# Patient Record
Sex: Female | Born: 1950
Health system: Southern US, Community
[De-identification: ages and names within clinical notes are randomized; demographics above are authoritative.]

## PROBLEM LIST (undated history)

## (undated) DIAGNOSIS — C20 Malignant neoplasm of rectum: Secondary | ICD-10-CM

## (undated) DIAGNOSIS — M8580 Other specified disorders of bone density and structure, unspecified site: Secondary | ICD-10-CM

## (undated) DIAGNOSIS — E559 Vitamin D deficiency, unspecified: Secondary | ICD-10-CM

## (undated) DIAGNOSIS — I82621 Acute embolism and thrombosis of deep veins of right upper extremity: Secondary | ICD-10-CM

## (undated) DIAGNOSIS — C189 Malignant neoplasm of colon, unspecified: Secondary | ICD-10-CM

## (undated) DIAGNOSIS — J90 Pleural effusion, not elsewhere classified: Secondary | ICD-10-CM

## (undated) DIAGNOSIS — J449 Chronic obstructive pulmonary disease, unspecified: Secondary | ICD-10-CM

## (undated) DIAGNOSIS — J302 Other seasonal allergic rhinitis: Secondary | ICD-10-CM

## (undated) DIAGNOSIS — J45909 Unspecified asthma, uncomplicated: Secondary | ICD-10-CM

## (undated) DIAGNOSIS — H269 Unspecified cataract: Secondary | ICD-10-CM

## (undated) DIAGNOSIS — K631 Perforation of intestine (nontraumatic): Secondary | ICD-10-CM

## (undated) DIAGNOSIS — R579 Shock, unspecified: Secondary | ICD-10-CM

## (undated) DIAGNOSIS — K649 Unspecified hemorrhoids: Secondary | ICD-10-CM

## (undated) DIAGNOSIS — J9621 Acute and chronic respiratory failure with hypoxia: Secondary | ICD-10-CM

## (undated) DIAGNOSIS — Z930 Tracheostomy status: Secondary | ICD-10-CM

## (undated) HISTORY — PX: FOOT SURGERY: SHX648

## (undated) HISTORY — DX: Malignant neoplasm of colon, unspecified: C18.9

## (undated) HISTORY — DX: Malignant neoplasm of rectum: C20

## (undated) HISTORY — DX: Unspecified hemorrhoids: K64.9

## (undated) HISTORY — DX: Pleural effusion, not elsewhere classified: J90

## (undated) HISTORY — DX: Shock, unspecified: R57.9

## (undated) HISTORY — DX: Other specified disorders of bone density and structure, unspecified site: M85.80

## (undated) HISTORY — PX: AUGMENTATION MAMMAPLASTY: SUR837

## (undated) HISTORY — DX: Chronic obstructive pulmonary disease, unspecified: J44.9

## (undated) HISTORY — DX: Unspecified cataract: H26.9

## (undated) HISTORY — PX: BREAST ENHANCEMENT SURGERY: SHX7

## (undated) HISTORY — PX: TONSILLECTOMY: SHX5217

## (undated) MED FILL — Dexamethasone Sodium Phosphate Inj 100 MG/10ML: INTRAMUSCULAR | Qty: 1 | Status: AC

---
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
## 2000-04-26 ENCOUNTER — Encounter: Admission: RE | Admit: 2000-04-26 | Discharge: 2000-04-26 | Payer: Self-pay | Admitting: Obstetrics and Gynecology

## 2000-04-26 ENCOUNTER — Encounter: Payer: Self-pay | Admitting: Obstetrics and Gynecology

## 2005-09-24 ENCOUNTER — Ambulatory Visit: Payer: Self-pay | Admitting: Critical Care Medicine

## 2005-11-03 ENCOUNTER — Ambulatory Visit: Payer: Self-pay | Admitting: Critical Care Medicine

## 2006-07-05 ENCOUNTER — Ambulatory Visit: Payer: Self-pay | Admitting: Critical Care Medicine

## 2007-04-07 ENCOUNTER — Ambulatory Visit: Payer: Self-pay | Admitting: Critical Care Medicine

## 2007-04-08 ENCOUNTER — Other Ambulatory Visit: Admission: RE | Admit: 2007-04-08 | Discharge: 2007-04-08 | Payer: Self-pay | Admitting: Obstetrics & Gynecology

## 2007-05-03 ENCOUNTER — Ambulatory Visit: Payer: Self-pay | Admitting: Critical Care Medicine

## 2007-11-03 DIAGNOSIS — J4489 Other specified chronic obstructive pulmonary disease: Secondary | ICD-10-CM | POA: Insufficient documentation

## 2007-11-03 DIAGNOSIS — J449 Chronic obstructive pulmonary disease, unspecified: Secondary | ICD-10-CM | POA: Insufficient documentation

## 2007-11-03 DIAGNOSIS — J438 Other emphysema: Secondary | ICD-10-CM | POA: Insufficient documentation

## 2008-01-16 ENCOUNTER — Telehealth (INDEPENDENT_AMBULATORY_CARE_PROVIDER_SITE_OTHER): Payer: Self-pay | Admitting: *Deleted

## 2008-02-29 ENCOUNTER — Ambulatory Visit: Payer: Self-pay | Admitting: Critical Care Medicine

## 2008-11-21 ENCOUNTER — Other Ambulatory Visit: Admission: RE | Admit: 2008-11-21 | Discharge: 2008-11-21 | Payer: Self-pay | Admitting: Obstetrics & Gynecology

## 2009-01-21 ENCOUNTER — Ambulatory Visit: Payer: Self-pay | Admitting: Critical Care Medicine

## 2009-05-30 ENCOUNTER — Telehealth (INDEPENDENT_AMBULATORY_CARE_PROVIDER_SITE_OTHER): Payer: Self-pay | Admitting: *Deleted

## 2009-10-11 ENCOUNTER — Ambulatory Visit: Payer: Self-pay | Admitting: Critical Care Medicine

## 2009-12-14 DIAGNOSIS — J449 Chronic obstructive pulmonary disease, unspecified: Secondary | ICD-10-CM

## 2009-12-14 HISTORY — DX: Chronic obstructive pulmonary disease, unspecified: J44.9

## 2010-04-01 ENCOUNTER — Telehealth: Payer: Self-pay | Admitting: Internal Medicine

## 2010-09-08 ENCOUNTER — Encounter: Payer: Self-pay | Admitting: Critical Care Medicine

## 2010-09-08 ENCOUNTER — Ambulatory Visit: Payer: Self-pay | Admitting: Critical Care Medicine

## 2011-01-06 ENCOUNTER — Ambulatory Visit
Admission: RE | Admit: 2011-01-06 | Discharge: 2011-01-06 | Payer: Self-pay | Source: Home / Self Care | Attending: Family Medicine | Admitting: Family Medicine

## 2011-01-06 DIAGNOSIS — Z9189 Other specified personal risk factors, not elsewhere classified: Secondary | ICD-10-CM

## 2011-01-06 DIAGNOSIS — M25559 Pain in unspecified hip: Secondary | ICD-10-CM

## 2011-01-06 DIAGNOSIS — J301 Allergic rhinitis due to pollen: Secondary | ICD-10-CM | POA: Insufficient documentation

## 2011-01-06 DIAGNOSIS — R030 Elevated blood-pressure reading, without diagnosis of hypertension: Secondary | ICD-10-CM | POA: Insufficient documentation

## 2011-01-06 HISTORY — DX: Other specified personal risk factors, not elsewhere classified: Z91.89

## 2011-01-06 HISTORY — DX: Pain in unspecified hip: M25.559

## 2011-01-15 NOTE — Progress Notes (Signed)
Summary: cough> needs ov if wants tussionex or any narcotic-LMTCB x 1   Phone Note Call from Patient   Caller: Patient Call For: wright Summary of Call: need refill on t[facility] Initial call taken by: Rickard Patience,  April 01, 2010 10:12 AM  Follow-up for Phone Call        called and spoke with pt.  pt last saw PW 10/11/2009 and was told to f/u in 6 months.  Informed pt she was due for f/u appt.  Pt states she will have to call back to schedule appt because she was at home and didn't have her work schedule with her.  However, pt calls today c/o non-productive cough that started over the weekend.  Pt believes this is d/t allergies.  Pt states she is taking Claritin D and an OTC cough syrup with no relief of symptoms.  Pt states only an occ sore throat but believes this is d/t the increased coughing esp at night.  Pt denied any increased sob. Pt requests rx for Tussionex.  Please advise.  Thank you.  Aundra Millet Reynolds LPN  April 01, 2010 10:31 AM  will need ov with Tammy for tussionex rx Follow-up by: Nyoka Cowden MD,  April 01, 2010 1:22 PM  Additional Follow-up for Phone Call Additional follow up Details #1::        LMOMTCB Vernie Murders  April 01, 2010 2:04 PM  pt advised and schedueld to see TP tomorrow. Carron Curie CMA  April 01, 2010 2:22 PM

## 2011-01-15 NOTE — Assessment & Plan Note (Signed)
Summary: Pulmonary OV   CC:  Follow up for refills.  Breathing unchaged from last OV.  Denies wheezing, chest tightness, and cough.  .  History of Present Illness: This is a 60 year old, white female, COPD with primary emphysematous component   September 08, 2010 2:39 PM f/u copd No real changes The pt notes dyspnea is stable and no new issues.   Pt denies any significant sore throat, nasal congestion or excess secretions, fever, chills, sweats, unintended weight loss, pleurtic or exertional chest pain, orthopnea PND, or leg swelling Pt denies any increase in rescue therapy over baseline, denies waking up needing it or having any early am or nocturnal exacerbations of coughing/wheezing/or dyspnea.   Preventive Screening-Counseling & Management  Alcohol-Tobacco     Smoking Status: quit > 6 months     Year Quit: 1996     Pack years: 5  Current Medications (verified): 1)  Spiriva Handihaler 18 Mcg Caps (Tiotropium Bromide Monohydrate) .... One Capsule in Handihaler Once Daily Needs Appt With Dr. Delford Field! 2)  Albuterol Sulfate (2.5 Mg/62ml) 0.083%  Nebu (Albuterol Sulfate) .... One in Neb Four Times Daily As Needed 3)  Claritin-[name] 24 Hour 10-240 Mg  Tb24 (Loratadine-Pseudoephedrine) .... As Needed 4)  Advair Diskus 250-50 Mcg/dose Aepb (Fluticasone-Salmeterol) .... Inhale 1 Puff Two Times A Day  Needs Appt With Dr Daphene Calamity!  Allergies (verified): 1)  ! Codeine  Past History:  Past medical, surgical, family and social histories (including risk factors) reviewed, and no changes noted (except as noted below).  Past Medical History: Tracy [name]   2011:  FeV1 31% predicted  FeV1/FVC 47%  Emphysema  Family History: Reviewed history from 02/29/2008 and no changes required. non contrib  Social History: Reviewed history from 02/29/2008 and no changes required. Patient states former smoker.  manager of real estate company  Review of Systems       The patient complains of shortness of  breath with activity.  The patient denies shortness of breath at rest, productive cough, non-productive cough, coughing up blood, chest pain, irregular heartbeats, acid heartburn, indigestion, loss of appetite, weight change, abdominal pain, difficulty swallowing, sore throat, tooth/dental problems, headaches, nasal congestion/difficulty breathing through nose, sneezing, itching, ear ache, anxiety, depression, hand/feet swelling, joint stiffness or pain, rash, change in color of mucus, and fever.    Vital Signs:  Patient profile:   60 year old female Height:      62 inches Weight:      124.13 pounds BMI:     22.79 O2 Sat:      96 % on Room air Temp:     98.5 degrees F oral Pulse rate:   92 / minute BP sitting:   108 / 84  (right arm) Cuff size:   regular  Vitals Entered By: Gweneth Dimitri RN (September 08, 2010 2:27 PM)  O2 Flow:  Room air   Contraindications/Deferment of Procedures/Staging:    Treatment: Flu Shot    Contraindication: adverse rxn/side effects     Treatment: Pneumovax    Contraindication: adverse rxn/side effects   Physical Exam  Additional Exam:  Gen: WD WN    WF      in NAD    NCAT Heent:  no jvd, no TMG, no cervical LNademopathy, orophyx clear,  nares with clear watery drainage. Cor: RRR nl s1/s2  no s3/s4  no m r h g Abd: soft NT BSA   no masses  No HSM  no rebound or guarding Ext perfused with  no c v e v.[name] Neuro: intact, moves all 4s, CN II-XII intact, DTRs intact Chest: distant BS  no wheezes, rales, rhonchi   no egophony  no consolidative breath sounds, mild hyperresonance to percussion Skin: clear  Genital/Rectal :deferred    Impression & Recommendations:  Problem # 1:  C O P [name] (ICD-496) Assessment Unchanged  Primary emphysema, stable at this time FeV1 improved from  27 to 31% predicted.  plan: cont advair/spiriva daily  Medications Added to Medication List This Visit: 1)  Advair Diskus 250-50 Mcg/dose Aepb (Fluticasone-salmeterol) ....  Inhale 1 puff two times a day  needs appt with dr Daphene Calamity!  Complete Medication List: 1)  Spiriva Handihaler 18 Mcg Caps (Tiotropium bromide monohydrate) .... One capsule in handihaler once daily needs appt with dr. Delford Field! 2)  Albuterol Sulfate (2.5 Mg/63ml) 0.083% Nebu (Albuterol sulfate) .... One in neb four times daily as needed 3)  Claritin-[name] 24 Hour 10-240 Mg Tb24 (Loratadine-pseudoephedrine) .... As needed 4)  Advair Diskus 250-50 Mcg/dose Aepb (Fluticasone-salmeterol) .... Inhale 1 puff two times a day  needs appt with dr Daphene Calamity!  Other Orders: Est. Patient Level III (16109) Spirometry w/Graph (94010)  Patient Instructions: 1)  No change in medications 2)  Return in     12     months 3)  You declined to receive flu and pneumovax Prescriptions: ALBUTEROL SULFATE (2.5 MG/3ML) 0.083%  NEBU (ALBUTEROL SULFATE) one in neb four times daily as needed  #150 x 12   Entered and Authorized by:   Storm Frisk MD   Signed by:   Storm Frisk MD on 09/08/2010   Method used:   [facility]ron[facility] to        [phone][facility] ([facility])       [address] [place], [place]  [address]       Ph: [phone] or [phone]       Fax: [phone]   RxID:   [id number] ADVAIR DISKUS 250-50 MCG/DOSE AEPB (FLUTICASONE-SALMETEROL) inhale 1 puff two times a day  NEEDS APPT WITH DR [facility]  #1 x 12   Entered and Authorized by:   Storm Frisk MD   Signed by:   Storm Frisk MD on 09/08/2010   Method used:   [facility]        [facility] [phone]* (retail)       [address] [place], [place]  [address]       Ph: [phone] or [phone]       Fax: [phone]   RxID:   [id number] SPIRIVA HANDIHALER 18 MCG CAPS (TIOTROPIUM BROMIDE MONOHYDRATE) one capsule in handihaler once daily NEEDS APPT WITH DR. Delford Field!  #30 x 12   Entered and Authorized by:   Storm Frisk MD   Signed by:   Storm Frisk MD on 09/08/2010   Method used:    [facility]        [facility]  [address] [phone]* (retail)       [address]       [place], [place]  [address]       Ph: [phone] or [phone]       Fax: [phone]   RxID:   [id number]

## 2011-01-15 NOTE — Assessment & Plan Note (Signed)
Summary: New pt pulled back muscle/dt [facility]   Vital Signs:  Patient profile:   60 year old female Height:      62 inches (157.48 cm) Weight:      118.25 pounds (53.75 kg) O2 Sat:      94 % on Room air Temp:     98.0 degrees F (36.67 degrees C) oral Pulse rate:   84 / minute BP sitting:   163 / 76  (right arm) Cuff size:   regular  Vitals Entered By: Josph Macho RMA (01-23-2011 3:35 PM)  O2 Flow:  Room air CC: Establish new patient/ pulled muscle in back lower left side X3 days/ CF Is Patient Diabetic? No   History of Present Illness: is a 60 year old Caucasian female who is in to establish care on an urgent basis due to some back pain. She reports roughly 3 days ago she had a sudden spasm in her back on the left side above her head. She was reaching with her right arm up into her closet when the muscles suddenly tightened. She has been taking some Advil roughly 2 tablets at a time gets minimal relief from this. She can find a relatively comfortable position and her pain is improved but with even a slight turn a sharp pain occurs in the catching in the hip makes it difficult for her to move. She denies any pain along the spine she denies any radiculopathy down either leg, she denies any urinary or bowel incontinence. otherwise she reports she is in generally good health she does follow regularly with gynecologist and she reports that they do her blood work for run annually basis she believes including FLP. She also follows annually with pulmonology for a long history of what was likely childhood asthma and has developed emphysema in her adult years. She reports very good control and cannot recall the last time she reached for her albuterol rescue inhaler. She denies any shortness of breath, chest pain, palpitations, fevers, chills, congestion, GI or GU complaints at this time. She uses Claritin-D intermittently for some seasonal allergies but is not having any difficulties at this  time  Preventive Screening-Counseling & Management  Alcohol-Tobacco     Smoking Status: quit  Caffeine-Diet-Exercise     Does Patient Exercise: yes      Drug Use:  no.    Current Problems (verified): 1)  Hip Pain, Left  (ICD-719.45) 2)  Allergic Rhinitis, Seasonal  (ICD-477.0) 3)  Chickenpox, Hx of  (ICD-V15.9) 4)  Emphysema  (ICD-492.8) 5)  C O P D  (ICD-496)  Current Medications (verified): 1)  Spiriva Handihaler 18 Mcg Caps (Tiotropium Bromide Monohydrate) .... One Capsule in Handihaler Once Daily Needs Appt With Dr. Delford Field! 2)  Albuterol Sulfate (2.5 Mg/68ml) 0.083%  Nebu (Albuterol Sulfate) .... One in Neb Four Times Daily As Needed 3)  Claritin-D 24 Hour 10-240 Mg  Tb24 (Loratadine-Pseudoephedrine) .... As Needed 4)  Advair Diskus 250-50 Mcg/dose Aepb (Fluticasone-Salmeterol) .... Inhale 1 Puff Two Times A Day  Needs Appt With Dr Daphene Calamity!  Allergies (verified): 1)  ! Codeine  Past History:  Past Medical History: Last updated: 09/08/2010 C O P D   2011:  FeV1 31% predicted  FeV1/FVC 47%  Emphysema  Family History: Last updated: Jan 23, 2011 Father: deceased@59 , fell in snow, broke ankle, blood clot to lungs Mother: 66, [employer] Siblings:  Brother: 13s, Parkinson's Disease, h/o being hit by lightening Sister:71,  heart murmur Brother: 62s, [employer] Sistrer: 61, [employer], CML MGM: deceased  older, late 24s MGF: deceased late 33s PGM: deceased younger from burn injury PGF: deceased in 82s, old age Children: Son: 38, asthma, allergies, OM Son: 47, asthma, allergies  Social History: Last updated: 01/06/2011 Patient states former smoker Production designer, 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, Programmer, 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, second marriage Former Smoker, always less than 1/2 ppd and quit > 24yrs ago No dietary restrictions Wears seat belt Drug use-no Regular exercise-yes Alcohol use-yes, occasional   Risk Factors: Exercise: yes (01/06/2011)  Risk  Factors: Smoking Status: quit (01/06/2011)  Past Surgical History: Tonsillectomy for recurrent otitis media at age 39yr  Family History: Father: deceased@59 , fell in snow, broke ankle, blood clot to lungs Mother: 44, [employer] Siblings:  Brother: 88s, Parkinson's Disease, h/o being hit by lightening Sister:71,  heart murmur Brother: 47s, [employer] Sistrer: 61, [employer], CML MGM: deceased older, late 61s MGF: deceased late 23s PGM: deceased younger from burn injury PGF: deceased in 82s, old age Children: Son: 36, asthma, allergies, OM Son: 44, asthma, allergies  Social History: Patient states former smoker Production designer, 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, Programmer, 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, second marriage Former Smoker, always less than 1/2 ppd and quit > 80yrs ago No dietary restrictions Wears seat belt Drug use-no Regular exercise-yes Alcohol use-yes, occasional  Occupation:  employed Smoking Status:  quit Drug Use:  no Does Patient Exercise:  yes  Review of Systems  The patient denies anorexia, fever, weight loss, weight gain, vision loss, decreased hearing, hoarseness, chest pain, syncope, dyspnea on exertion, peripheral edema, prolonged cough, headaches, hemoptysis, abdominal pain, melena, hematochezia, severe indigestion/heartburn, hematuria, incontinence, muscle weakness, suspicious skin lesions, transient blindness, difficulty walking, depression, unusual weight change, and enlarged lymph nodes.    Physical Exam  General:  Well-developed,well-nourished,in no acute distress; alert,appropriate and cooperative throughout examination Head:  Normocephalic and atraumatic without obvious abnormalities. No apparent alopecia or balding. Eyes:  No corneal or conjunctival inflammation noted. EOMI. Perrla.  Ears:  External ear exam shows no significant lesions or deformities.  Otoscopic examination reveals clear canals, tympanic membranes are intact bilaterally without bulging,  retraction, inflammation or discharge. Hearing is grossly normal bilaterally. Nose:  External nasal examination shows no deformity or inflammation. Nasal mucosa are pink and moist without lesions or exudates. Mouth:  Oral mucosa and oropharynx without lesions or exudates.  Teeth in good repair. Neck:  No deformities, masses, or tenderness noted. Lungs:  Normal respiratory effort, chest expands symmetrically. Lungs are clear to auscultation, no crackles or wheezes. Heart:  Normal rate and regular rhythm. S1 and S2 normal without gallop, murmur, click, rub or other extra sounds. Abdomen:  Bowel sounds positive,abdomen soft and non-tender without masses, organomegaly or hernias noted. Msk:  No deformity or scoliosis noted of thoracic or lumbar spine.  Mild pain with palp and extension over left sacroiliac joint Pulses:  R and L carotid,radial,femoral,dorsalis pedis and posterior tibial pulses are full and equal bilaterally Extremities:  No clubbing, cyanosis, edema, or deformity noted with normal full range of motion of all joints.   Neurologic:  No cranial nerve deficits noted. Station and gait are normal. Plantar reflexes are down-going bilaterally. DTRs are symmetrical throughout. Sensory, motor and coordinative functions appear intact. Cervical Nodes:  No lymphadenopathy noted Psych:  Cognition and judgment appear intact. Alert and cooperative with normal attention span and concentration. No apparent delusions, illusions, hallucinations   Impression & Recommendations:  Problem # 1:  HIP PAIN, LEFT (ICD-719.45)  Her updated medication list  for this problem includes:    Cyclobenzaprine Hcl 10 Mg Tabs (Cyclobenzaprine hcl) .Marland Kitchen... 1 tab by mouth two times a day as needed pain, will cause sedation    Naproxen 500 Mg Tabs (Naproxen) .Marland Kitchen... 1 tab by mouth two times a day as needed pain with food Sacroilietis, recommend moist heat and gentle stretching. Report if symptoms worsen  Problem # 2:   EMPHYSEMA (ICD-492.8) Follow annually with pulmonology and maintains very good control of her asthma, needs her Albuterol very infrequently.  Problem # 3:  Preventive Health Care (ICD-V70.0) Will continue receive her GYN and MGMs with her gynecologist, she reports they order her annual labs but she agrees to supply [name] with a copy for review and for future reference so we will know her baseline in the event we need to treat an acute illness  Problem # 4:  ELEVATED BP READING WITHOUT DX HYPERTENSION (ICD-796.2) Mild elevation with acute pain and new visit. Patient denies any history of HTN so will evaluate anew at next visit.  Complete Medication List: 1)  Spiriva Handihaler 18 Mcg Caps (Tiotropium bromide monohydrate) .... One capsule in handihaler once daily needs appt with dr. Delford Field! 2)  Albuterol Sulfate (2.5 Mg/25ml) 0.083% Nebu (Albuterol sulfate) .... One in neb four times daily as needed 3)  Claritin-d 24 Hour 10-240 Mg Tb24 (Loratadine-pseudoephedrine) .... As needed 4)  Advair Diskus 250-50 Mcg/dose Aepb (Fluticasone-salmeterol) .... Inhale 1 puff two times a day  needs appt with dr Daphene Calamity! 5)  Cyclobenzaprine Hcl 10 Mg Tabs (Cyclobenzaprine hcl) .Marland Kitchen.. 1 tab by mouth two times a day as needed pain, will cause sedation 6)  Naproxen 500 Mg Tabs (Naproxen) .Marland Kitchen.. 1 tab by mouth two times a day as needed pain with food  Patient Instructions: 1)  Please schedule a follow-up appointment in 1 year and forward [name] a copy of your labs from your G 2)  YN 3)  Moist heat and gentle stretching daily 4)  Most patients (90%) with low back pain will improve with time ( 2-6 weeks). Keep active but avoid activities that are painful. Apply moist heat and/or ice to lower back several times a day.  Prescriptions: NAPROXEN 500 MG TABS (NAPROXEN) 1 tab by mouth two times a day as needed pain with food  #60 x 1   Entered and Authorized by:   Danise Edge MD   Signed by:   Danise Edge MD on 01/06/2011   Method  used:   Print then Give to Patient   RxID:   [id number] CYCLOBENZAPRINE HCL 10 MG TABS (CYCLOBENZAPRINE HCL) 1 tab by mouth two times a day as needed pain, will cause sedation  #40 x 1   Entered and Authorized by:   Danise Edge MD   Signed by:   Danise Edge MD on 01/06/2011   Method used:   Print then Give to Patient   RxID:   [id number]

## 2011-01-22 ENCOUNTER — Encounter: Payer: Self-pay | Admitting: *Deleted

## 2011-04-28 NOTE — Assessment & Plan Note (Signed)
[facility]                             PULMONARY OFFICE NOTE   NAME:Tracy [name], Tracy [name]                       MRN:          [id number]  DATE:05/03/2007                            DOB:          02-17-1951    HISTORY OF PRESENT ILLNESS:  The patient is a 60 year old, white female  patient of Dr. Delford Field, who has a known history of severe emphysematous  COPD, presents today with a one week history of a productive cough with  thick, yellow sputum, nasal congestion, and wheezing.  Patient denies  any hemoptysis, orthopnea, PND, or leg swelling.   PAST MEDICAL HISTORY:  Reviewed.   CURRENT MEDICATIONS:  Reviewed.   PHYSICAL EXAMINATION:  GENERAL:  The patient is a pleasant female in no  acute distress.  VITAL SIGNS:  She is afebrile with stable vital signs.  O2 saturation is  93% on room air.  HEENT:  Unremarkable.  NECK:  Supple without adenopathy.  No JVD.  CHEST:  Lung sounds reveal diminished breath sounds at the bases with a  few expiratory wheezes.  CARDIAC:  Regular rate.  ABDOMEN:  Soft and nontender.  EXTREMITIES:  Warm without any edema.   IMPRESSION AND PLAN:  Acute chronic obstructive pulmonary disease  exacerbation.  Patient is given a Z-Pac.  Mucinex-DM twice a day.  Patient is to taper over the next week.  Patient may use Tussionex, #4  ounces, 1 teaspoon every 12 hours as needed for cough control.  Patient  is aware of sedating effect.  Patient will return back with Dr. Delford Field  as scheduled or sooner if needed.      Rubye Oaks, NP  Electronically Signed      Charlcie Cradle Delford Field, MD, [facility]  Electronically Signed   TP/MedQ  DD: 05/03/2007  DT: 05/03/2007  Job #: [id number]

## 2011-05-01 NOTE — Assessment & Plan Note (Signed)
[facility]                             PULMONARY OFFICE NOTE   DANISHA, BRASSFIELD                       MRN:          [id number]  DATE:04/07/2007                            DOB:          Jan 03, 1951    This is a 60 year old white female with chronic obstructive lung  disease, primary emphysematous component.   Overall, this patient is maintained on:  1. Spiriva daily.  2. Adair 250/50 one spray b.i.d.   EXAMINATION:  Temperature is 97.8.  Blood pressure 104/70.  Pulse 85.  Saturation 93% room air.  CHEST:  Showed distant breath sounds with prolonged expiratory phase.  No wheeze or rhonchi.  CARDIAC EXAM:  Showed a regular rate and rhythm without S3.  Normal S1  and S2.  ABDOMEN:  Soft and non-tender.  EXTREMITIES:  Showed no edema or clubbing.  SKIN:  Was clear.  Spirometry was obtained, and showed an FEV1 of 0.64, FVC of 1.87,  FEV1:FVC ratio 34%  compatible with severe obstructive defect.   IMPRESSION:  Severe chronic obstructive lung disease with emphysematous  component.   PLAN:  For this patient is to maintain Advair 250/50 one spray b.i.d.  and Spiriva daily.  We will see this patient back in return followup in  6 months.     Charlcie Cradle Delford Field, MD, [facility]  Electronically Signed    PEW/MedQ  DD: 04/07/2007  DT: 04/07/2007  Job #: [id number]   cc:   Kerry Kass, M.D. [facility]

## 2011-05-01 NOTE — Assessment & Plan Note (Signed)
[facility]                               PULMONARY OFFICE NOTE   NAME:Lindamood, Marielle                       MRN:          [id number]  DATE:07/05/2006                            DOB:          07-26-1951    Mrs. Qadir is a 60 year old white female, history of chronic obstructive  lung disease and primary emphysema, returns today with minimal cough, no  chest pain, not actively smoking.  She is not requiring her rescue inhaler,  maintains Advair 250/50 one spray b.i.d., Spiriva daily.   PHYSICAL EXAMINATION:  VITAL SIGNS:  Temp 98, blood pressure 120/80, pulse  77, saturation 96% room air.  CHEST:  Diminished breath sounds with prolonged expiratory phase.  No wheeze  or rhonchi noted.  CARDIAC:  Regular rate and rhythm without S3, normal S1 S2.  ABDOMEN:  Soft, nontender.  EXTREMITIES:  No edema or clubbing.  SKIN:  Clear.  NEUROLOGIC:  Exam was intact.   A spirometry was obtained showing an FEV1 of 0.68, FEV1-FVC ratio of 33%,  FVC of 82% predicted, FEF25-75 was only 5% predicted.   IMPRESSION:  Severe chronic obstructive lung disease with primary  emphysematous asthmatic bronchitic components stable at this time.   PLAN:  Maintain Spiriva and Advair as currently dosed, and we will see the  patient back for return followup in 5 months.                                   Charlcie Cradle Delford Field, MD, FCCP   PEW/MedQ  DD:  07/05/2006  DT:  07/06/2006  Job #:  [id number]

## 2011-09-09 ENCOUNTER — Other Ambulatory Visit: Payer: Self-pay | Admitting: Critical Care Medicine

## 2011-09-09 NOTE — Telephone Encounter (Signed)
Pt due for OV. LMOMTCB x 1.

## 2011-09-11 NOTE — Telephone Encounter (Signed)
Called [phone] - lmomtcb to schedule  Yearly OV with PW then will send in rx

## 2011-09-23 NOTE — Telephone Encounter (Signed)
Called [phone] - lmomtcb to schedule OV with PW.  We have tried to contact pt so will also send in rx for 1 refill with instructions pt needs to schedule OV with PW.

## 2011-10-16 ENCOUNTER — Ambulatory Visit (INDEPENDENT_AMBULATORY_CARE_PROVIDER_SITE_OTHER): Payer: No Typology Code available for payment source | Admitting: Critical Care Medicine

## 2011-10-16 ENCOUNTER — Encounter: Payer: Self-pay | Admitting: Critical Care Medicine

## 2011-10-16 VITALS — BP 140/88 | HR 67 | Temp 98.0°F | Ht 62.0 in | Wt 125.2 lb

## 2011-10-16 DIAGNOSIS — J449 Chronic obstructive pulmonary disease, unspecified: Secondary | ICD-10-CM

## 2011-10-16 DIAGNOSIS — J438 Other emphysema: Secondary | ICD-10-CM

## 2011-10-16 DIAGNOSIS — J439 Emphysema, unspecified: Secondary | ICD-10-CM

## 2011-10-16 MED ORDER — ALBUTEROL SULFATE (2.5 MG/3ML) 0.083% IN NEBU: 2.5000 mg | INHALATION_SOLUTION | Freq: Four times a day (QID) | RESPIRATORY_TRACT | Status: DC | PRN

## 2011-10-16 MED ORDER — FLUTICASONE-SALMETEROL 250-50 MCG/DOSE IN AEPB: 1.0000 | INHALATION_SPRAY | Freq: Two times a day (BID) | RESPIRATORY_TRACT | Status: DC

## 2011-10-16 MED ORDER — TIOTROPIUM BROMIDE MONOHYDRATE 18 MCG IN CAPS: 18.0000 ug | ORAL_CAPSULE | Freq: Every day | RESPIRATORY_TRACT | Status: DC

## 2011-10-16 NOTE — Patient Instructions (Signed)
Refills for inhalers/neb meds No change in medications Return for pulmonary function testing Return 1 year

## 2011-10-16 NOTE — Progress Notes (Signed)
Subjective:    Patient ID: Tracy [name], female    DOB: 02/14/1951, 60 y.o.   MRN: [id number]  HPI This is a 60 y.o., white female, COPD with primary emphysematous component  September 08, 2010 2:39 PM  f/u copd  No real changes  The pt notes dyspnea is stable and no new issues.  Pt denies any significant sore throat, nasal congestion or excess secretions, fever, chills, sweats, unintended weight loss, pleurtic or exertional chest pain, orthopnea PND, or leg swelling  Pt denies any increase in rescue therapy over baseline, denies waking up needing it or having any early am or nocturnal exacerbations of coughing/wheezing/or dyspnea.   10/16/2011 No real changes in dyspnea over past one year.  No smoking.  No real cough.  No wheeze.  No chest pain. No qhs symptoms  Past Medical History  Diagnosis Date  . COPD (chronic obstructive pulmonary disease) 2011    FeV1 31% predicted FeV1/FVX 47 %  . Emphysema of lung      Family History  Problem Relation Age of Onset  . Other Father     Larey Seat in snow, broke ankle, blood clot to lungs  . Heart murmur Sister   . Other Brother     h/o being hit by lightening  . Parkinsonism Brother   . Asthma Son   . Allergies Son   . Otitis media Son   . Other Sister     CML  . Asthma Son   . Allergies Son      History   Social History  . Marital Status: Married    Spouse Name: N/A    Number of Children: N/A  . Years of Education: N/A   Occupational History  . Not on file.   Social History Main Topics  . Smoking status: Former Smoker -- 0.5 packs/day for 20 years    Types: Cigarettes    Quit date: 12/14/1990  . Smokeless tobacco: Never Used  . Alcohol Use: 0.0 oz/week    0 drink(s) per week  . Drug Use: No  . Sexually Active: Not on file   Other Topics Concern  . Not on file   Social History Narrative  . No narrative on file     Allergies  Allergen Reactions  . Codeine      Outpatient Prescriptions Prior to Visit    Medication Sig Dispense Refill  . loratadine (CLARITIN) 10 MG tablet Take 10 mg by mouth as needed.        Marland Kitchen albuterol (PROVENTIL) (2.5 MG/3ML) 0.083% nebulizer solution Take 2.5 mg by nebulization 4 (four) times daily as needed.        . Fluticasone-Salmeterol (ADVAIR DISKUS) 250-50 MCG/DOSE AEPB Inhale 1 puff into the lungs 2 (two) times daily.        Marland Kitchen SPIRIVA HANDIHALER 18 MCG inhalation capsule 1 CAPSULE IN INHALER DAILY  30 capsule  0  . naproxen (NAPROSYN) 500 MG tablet Take 500 mg by mouth 2 (two) times daily as needed. For pain- take with food          Review of Systems Constitutional:   No  weight loss, night sweats,  Fevers, chills, fatigue, lassitude. HEENT:   No headaches,  Difficulty swallowing,  Tooth/dental problems,  Sore throat,                No sneezing, itching, ear ache, nasal congestion, post nasal drip,   CV:  No chest pain,  Orthopnea, PND, swelling in lower extremities,  anasarca, dizziness, palpitations  GI  No heartburn, indigestion, abdominal pain, nausea, vomiting, diarrhea, change in bowel habits, loss of appetite  Resp: No shortness of breath with exertion or at rest.  No excess mucus, no productive cough,  No non-productive cough,  No coughing up of blood.  No change in color of mucus.  No wheezing.  No chest wall deformity  Skin: no rash or lesions.  GU: no dysuria, change in color of urine, no urgency or frequency.  No flank pain.  MS:  No joint pain or swelling.  No decreased range of motion.  No back pain.  Psych:  No change in mood or affect. No depression or anxiety.  No memory loss.     Objective:   Physical Exam  Filed Vitals:   10/16/11 1058  BP: 140/88  Pulse: 67  Temp: 98 F (36.7 C)  TempSrc: Oral  Height: 5\' 2"  (1.575 m)  Weight: 125 lb 3.2 oz (56.79 kg)  SpO2: 97%    Gen: Pleasant, well-nourished, in no distress,  normal affect  ENT: No lesions,  mouth clear,  oropharynx clear, no postnasal drip  Neck: No JVD, no TMG, no  carotid bruits  Lungs: No use of accessory muscles, no dullness to percussion, distant BS  Cardiovascular: RRR, heart sounds normal, no murmur or gallops, no peripheral edema  Abdomen: soft and NT, no HSM,  BS normal  Musculoskeletal: No deformities, no cyanosis or clubbing  Neuro: alert, non focal  Skin: Warm, no lesions or rashes  PFT Conversion 09/08/2010  FVC PREDICT 3.143  FVC  % Predicted 52.76  FEV1 PREDICT 2.448  FEV % Predicted 30.66  FEV1/FVC PRE 78.697  FEV1/FVC%EXP 57.525  FEF % EXPEC 12.528  PEF % EXPECT 34.877  /     Assessment & Plan:   C O P D Chronic obstructive lung disease with primary emphysematous component stable at this time stage III Plan Maintain inhaled medications as prescribed Obtain pulmonary function studies    Updated Medication List Outpatient Encounter Prescriptions as of 10/16/2011  Medication Sig Dispense Refill  . albuterol (PROVENTIL) (2.5 MG/3ML) 0.083% nebulizer solution Take 3 mLs (2.5 mg total) by nebulization 4 (four) times daily as needed.  120 mL  6  . CALCIUM PO Take by mouth daily.        . Fluticasone-Salmeterol (ADVAIR DISKUS) 250-50 MCG/DOSE AEPB Inhale 1 puff into the lungs 2 (two) times daily.  180 each  4  . loratadine (CLARITIN) 10 MG tablet Take 10 mg by mouth as needed.        . tiotropium (SPIRIVA HANDIHALER) 18 MCG inhalation capsule Place 1 capsule (18 mcg total) into inhaler and inhale daily.  90 capsule  4  . Vitamin D, Ergocalciferol, (DRISDOL) 50000 UNITS CAPS Take 1 capsule by mouth Once a week.      Marland Kitchen DISCONTD: albuterol (PROVENTIL) (2.5 MG/3ML) 0.083% nebulizer solution Take 2.5 mg by nebulization 4 (four) times daily as needed.        Marland Kitchen DISCONTD: Fluticasone-Salmeterol (ADVAIR DISKUS) 250-50 MCG/DOSE AEPB Inhale 1 puff into the lungs 2 (two) times daily.        Marland Kitchen DISCONTD: SPIRIVA HANDIHALER 18 MCG inhalation capsule 1 CAPSULE IN INHALER DAILY  30 capsule  0  . DISCONTD: naproxen (NAPROSYN) 500 MG tablet  Take 500 mg by mouth 2 (two) times daily as needed. For pain- take with food

## 2011-10-16 NOTE — Assessment & Plan Note (Signed)
Chronic obstructive lung disease with primary emphysematous component stable at this time stage III Plan Maintain inhaled medications as prescribed Obtain pulmonary function studies

## 2011-11-13 ENCOUNTER — Telehealth: Payer: Self-pay | Admitting: Critical Care Medicine

## 2011-11-13 DIAGNOSIS — J439 Emphysema, unspecified: Secondary | ICD-10-CM

## 2011-11-13 MED ORDER — ALBUTEROL SULFATE (2.5 MG/3ML) 0.083% IN NEBU: 2.5000 mg | INHALATION_SOLUTION | Freq: Four times a day (QID) | RESPIRATORY_TRACT | Status: DC | PRN

## 2011-11-13 MED ORDER — TIOTROPIUM BROMIDE MONOHYDRATE 18 MCG IN CAPS: 18.0000 ug | ORAL_CAPSULE | Freq: Every day | RESPIRATORY_TRACT | Status: DC

## 2011-11-13 MED ORDER — FLUTICASONE-SALMETEROL 250-50 MCG/DOSE IN AEPB: 1.0000 | INHALATION_SPRAY | Freq: Two times a day (BID) | RESPIRATORY_TRACT | Status: DC

## 2011-11-13 NOTE — Telephone Encounter (Signed)
Pt needs refills for Spiriva, Advair and Albuterol sent to her local pharmacy while she waits on her mail order supply. Prescriptions sent.

## 2012-08-11 ENCOUNTER — Ambulatory Visit (INDEPENDENT_AMBULATORY_CARE_PROVIDER_SITE_OTHER): Payer: No Typology Code available for payment source | Admitting: General Surgery

## 2012-08-11 ENCOUNTER — Encounter (INDEPENDENT_AMBULATORY_CARE_PROVIDER_SITE_OTHER): Payer: Self-pay | Admitting: General Surgery

## 2012-08-11 VITALS — BP 110/62 | HR 72 | Temp 97.2°F | Resp 16 | Ht 62.0 in | Wt 118.8 lb

## 2012-08-11 DIAGNOSIS — K648 Other hemorrhoids: Secondary | ICD-10-CM

## 2012-08-11 NOTE — Progress Notes (Signed)
Patient ID: Tracy [name], female   DOB: 1951-03-03, 61 y.o.   MRN: [id number]  Chief Complaint  Patient presents with  . Pre-op Exam    eval hems    HPI Tracy [name] is a 61 y.o. female.   HPI 61 year old Caucasian female referred by Dr. Hyacinth Meeker for evaluation of hemorrhoidal problems. The patient states that she has had hemorrhoidal problems for years ever since she had her children. She states that she has a bowel movement at least every other day if not every day. She doesn't strain with some bowel movements. She has noticed some occasional blood on the toilet paper. She also has pain with bowel movements occasionally if she has multiple bowel movements a day. She states that the hemorrhoid will protrude when she has a bowel movement but it will go back in spontaneously. She denies any perianal itching or burning. She denies any fecal or urinary incontinence. She states that she drinks about 3 glasses of water a day. She denies being a bathroom reader. She denies any previous hemorrhoidal procedures. She denies any weight loss. She denies any family history of colon or rectal cancer. She has not had a colonoscopy. Past Medical History  Diagnosis Date  . COPD (chronic obstructive pulmonary disease) 2011    FeV1 31% predicted FeV1/FVX 47 %  . Emphysema of lung     Past Surgical History  Procedure Date  . Tonsillectomy age 77     recurrent otitis media    Family History  Problem Relation Age of Onset  . Other Father     Larey Seat in snow, broke ankle, blood clot to lungs  . Heart murmur Sister   . Other Brother     h/o being hit by lightening  . Parkinsonism Brother   . Asthma Son   . Allergies Son   . Otitis media Son   . Other Sister     CML  . Asthma Son   . Allergies Son     Social History History  Substance Use Topics  . Smoking status: Former Smoker -- 0.5 packs/day for 20 years    Types: Cigarettes    Quit date: 12/14/1990  . Smokeless tobacco: Never Used  . Alcohol  Use: 0.0 oz/week    0 drink(s) per week    Allergies  Allergen Reactions  . Codeine     Current Outpatient Prescriptions  Medication Sig Dispense Refill  . albuterol (PROVENTIL) (2.5 MG/3ML) 0.083% nebulizer solution Take 3 mLs (2.5 mg total) by nebulization 4 (four) times daily as needed. DX:  496  120 mL  3  . CALCIUM PO Take by mouth daily.        . Fluticasone-Salmeterol (ADVAIR DISKUS) 250-50 MCG/DOSE AEPB Inhale 1 puff into the lungs 2 (two) times daily.  60 each  3  . loratadine (CLARITIN) 10 MG tablet Take 10 mg by mouth as needed.        . tiotropium (SPIRIVA HANDIHALER) 18 MCG inhalation capsule Place 1 capsule (18 mcg total) into inhaler and inhale daily.  30 capsule  3  . Vitamin D, Ergocalciferol, (DRISDOL) 50000 UNITS CAPS Take 1 capsule by mouth Once a week.        Review of Systems Review of Systems  Constitutional: Negative for activity change, appetite change and unexpected weight change.  HENT: Negative for hearing loss and neck stiffness.   Eyes: Negative for photophobia and visual disturbance.  Respiratory: Negative for chest tightness, shortness of breath and wheezing.        +  COPD; uses inhalers. Denies SOB, DOE.  Cardiovascular:       Denies CP, orthopnea, PND  Gastrointestinal: Positive for anal bleeding. Negative for nausea, vomiting, abdominal pain, diarrhea, constipation, blood in stool and abdominal distention.  Genitourinary: Negative for dysuria, difficulty urinating, vaginal pain and pelvic pain.  Musculoskeletal: Negative for back pain.  Neurological: Negative for dizziness, tremors, seizures, weakness and light-headedness.  Hematological: Negative for adenopathy. Does not bruise/bleed easily.  Psychiatric/Behavioral: Negative for behavioral problems and decreased concentration.    Blood pressure 110/62, pulse 72, temperature 97.2 F (36.2 C), temperature source Temporal, resp. rate 16, height 5\' 2"  (1.575 m), weight 118 lb 12.8 oz (53.887  kg).  Physical Exam Physical Exam  Vitals reviewed. Constitutional: She is oriented to person, place, and time. She appears well-developed and well-nourished. No distress.  HENT:  Head: Normocephalic and atraumatic.  Right Ear: External ear normal.  Left Ear: External ear normal.  Nose: Nose normal.  Eyes: Conjunctivae are normal. No scleral icterus.  Neck: Normal range of motion. Neck supple. No thyromegaly present.  Cardiovascular: Normal rate, regular rhythm and normal heart sounds.   Pulmonary/Chest: Effort normal and breath sounds normal. No respiratory distress. She has no wheezes.  Abdominal: Soft. Bowel sounds are normal. She exhibits no distension. There is no tenderness.  Genitourinary: Rectal exam shows external hemorrhoid and internal hemorrhoid. Rectal exam shows no fissure and anal tone normal.       Large anterior midline combo internal/external hemorrhoid; right lateral internal hemorrhoid. Good tone. ?small left lateral internal hemorrhoid.  Musculoskeletal: Normal range of motion. She exhibits no edema and no tenderness.  Neurological: She is alert and oriented to person, place, and time. She exhibits normal muscle tone.  Skin: Skin is warm and dry. No rash noted. She is not diaphoretic. No erythema.    Data Reviewed Dr Rondel Baton note Dr Lynelle Doctor pulmonary note from 2012  Assessment    Symptomatic Anterior Internal/external hemorrhoid Right lateral internal hemorrhoid    Plan    We discussed the etiology of hemorrhoids. The patient was given educational material as well as diagrams. We discussed nonoperative and operative management of hemorrhoidal disease.  We discussed the importance of having a daily soft bowel movement and avoiding constipation. We also discussed good bowel habits such as not reading in the bathroom, not straining, and drinking 6-8 glasses of water per day. We also discussed the importance of a high fiber diet. We discussed foods that were  high in fiber as well as fiber supplements. We discussed the importance of trying to get 25-30 g of fiber per day in their diet. We discussed the need to start with a low dose of fiber and then gradually increasing their daily fiber dose over several weeks in order to avoid bloating and cramping.  We then discussed different surgical techniques for hemorrhoids, specifically hemorrhoidal banding and excisional hemorrhoidectomy.  PLAN:  Since the patient is having pain and occasional bleeding with good bowel habits, I offered her surgery. She has elected to proceed to the OR for Exam under anesthesia, hemorrhoidal banding, excisional hemorrhoidectomy  I discussed the procedure in detail.  The patient was given Agricultural engineer.  We discussed the risks and benefits of surgery including, but not limited to bleeding, infection, blood clot formation, anesthesia risk, urinary retention, hemorrhoid recurrence, injury to the sphincters resulting in incontinence, and the rare possibility of anal canal narrowing. I explained that the likelihood of improvement of their symptoms is good  We discussed the  typical postoperative course.  I stressed the importance of not becoming constipated after surgery.  The patient was encouraged to limit pain medication if possible as this increases the likelihood of becoming constipated. The patient was advised to take stool softners & drink 8-10 glasses of non-carbonated, non-alcoholic beverages per day and to eat a high fiber diet.  I also encouraged soaking in a water warm bath for 15 minutes at a time several times a day and after a bowel movement.  The patient was advised to take laxatives such as milk of magnesia or Miralax if no bowel movement three days after surgery.  The patient was advised to expect some blood tinged drainage as well as some blood in their bowel movements.   [name]. Andrey Campanile, MD, FACS General, Bariatric, & Minimally Invasive Surgery [facility]  [facility], [place]         [facility] M 08/11/2012, 9:23 AM

## 2012-08-11 NOTE — Patient Instructions (Signed)
GETTING TO GOOD BOWEL HEALTH. Irregular bowel habits such as constipation and diarrhea can lead to many problems over time.  Having one soft bowel movement a day is the most important way to prevent further problems.  The anorectal canal is designed to handle stretching and feces to safely manage our ability to get rid of solid waste (feces, poop, stool) out of our body.  BUT, hard constipated stools can act like ripping concrete bricks and diarrhea can be a burning fire to this very sensitive area of our body, causing inflamed hemorrhoids, anal fissures, increasing risk is perirectal abscesses, abdominal pain/bloating, an making irritable bowel worse.     The goal: ONE SOFT BOWEL MOVEMENT A DAY!  To have soft, regular bowel movements:    Drink at least 8 tall glasses of water a day.     Take plenty of fiber.  Fiber is the undigested part of plant food that passes into the colon, acting s "natures broom" to encourage bowel motility and movement.  Fiber can absorb and hold large amounts of water. This results in a larger, bulkier stool, which is soft and easier to pass. Work gradually over several weeks up to 6 servings a day of fiber (25g a day even more if needed) in the form of: o Vegetables -- Root (potatoes, carrots, turnips), leafy green (lettuce, salad greens, celery, spinach), or cooked high residue (cabbage, broccoli, etc) o Fruit -- Fresh (unpeeled skin & pulp), Dried (prunes, apricots, cherries, etc ),  or stewed ( applesauce)  o Whole grain breads, pasta, etc (whole wheat)  o Bran cereals    Bulking Agents -- This type of water-retaining fiber generally is easily obtained each day by one of the following:  o Psyllium bran -- The psyllium plant is remarkable because its ground seeds can retain so much water. This product is available as Metamucil, Konsyl, Effersyllium, Per Diem Fiber, or the less expensive generic preparation in drug and health food stores. Although labeled a laxative, it really  is not a laxative.  o Methylcellulose -- This is another fiber derived from wood which also retains water. It is available as Citrucel. o Polyethylene Glycol - and "artificial" fiber commonly called Miralax or Glycolax.  It is helpful for people with gassy or bloated feelings with regular fiber o Flax Seed - a less gassy fiber than psyllium   No reading or other relaxing activity while on the toilet. If bowel movements take longer than 5 minutes, you are too constipated   AVOID CONSTIPATION.  High fiber and water intake usually takes care of this.  Sometimes a laxative is needed to stimulate more frequent bowel movements, but    Laxatives are not a good long-term solution as it can wear the colon out. o Osmotics (Milk of Magnesia, Fleets phosphosoda, Magnesium citrate, MiraLax, GoLytely) are safer than  o Stimulants (Senokot, Castor Oil, Dulcolax, Ex Lax)    o Do not take laxatives for more than 7days in a row.    IF SEVERELY CONSTIPATED, try a Bowel Retraining Program: o Do not use laxatives.  o Eat a diet high in roughage, such as bran cereals and leafy vegetables.  o Drink six (6) ounces of prune or apricot juice each morning.  o Eat two (2) large servings of stewed fruit each day.  o Take one (1) heaping tablespoon of a psyllium-based bulking agent twice a day. Use sugar-free sweetener when possible to avoid excessive calories.  o Eat a normal breakfast.  o   Set aside 15 minutes after breakfast to sit on the toilet, but do not strain to have a bowel movement.  o If you do not have a bowel movement by the third day, use an enema and repeat the above steps.  o . o

## 2012-10-03 ENCOUNTER — Encounter (HOSPITAL_COMMUNITY): Payer: Self-pay | Admitting: Pharmacy Technician

## 2012-10-04 ENCOUNTER — Encounter (HOSPITAL_COMMUNITY): Payer: Self-pay

## 2012-10-04 ENCOUNTER — Encounter (HOSPITAL_COMMUNITY)
Admission: RE | Admit: 2012-10-04 | Discharge: 2012-10-04 | Disposition: A | Discharge status: Home / Self Care | Payer: PRIVATE HEALTH INSURANCE | Source: Ambulatory Visit | Attending: General Surgery | Admitting: General Surgery

## 2012-10-04 HISTORY — DX: Other seasonal allergic rhinitis: J30.2

## 2012-10-04 HISTORY — DX: Vitamin D deficiency, unspecified: E55.9

## 2012-10-04 HISTORY — DX: Unspecified asthma, uncomplicated: J45.909

## 2012-10-04 LAB — BASIC METABOLIC PANEL
CO2: 28 mEq/L (ref 19–32)
Chloride: 100 mEq/L (ref 96–112)
Glucose, Bld: 101 mg/dL — ABNORMAL HIGH (ref 70–99)
Potassium: 3.8 mEq/L (ref 3.5–5.1)
Sodium: 138 mEq/L (ref 135–145)

## 2012-10-04 LAB — CBC
Hemoglobin: 13.8 g/dL (ref 12.0–15.0)
MCH: 31.3 pg (ref 26.0–34.0)
MCV: 93.2 fL (ref 78.0–100.0)
RBC: 4.41 MIL/uL (ref 3.87–5.11)
WBC: 6.2 10*3/uL (ref 4.0–10.5)

## 2012-10-04 NOTE — Pre-Procedure Instructions (Signed)
20 Tracy [name]  10/04/2012   Your procedure is scheduled on:  Fri, Nov 1 @ 11:00 AM  Report to [facility] at 9:00 AM.  Call this number if you have problems the morning of surgery: [phone]   Remember:   Do not eat food:After Midnight.  Take these medicines the morning of surgery with A SIP OF WATER: Albuterol<Bring Your Inhaler With You>,Fluticasone(Advair),Loratadine(Claritin),and Spiriva   Do not wear jewelry, make-up or nail polish.  Do not wear lotions, powders, or perfumes. You may wear deodorant.  Do not shave 48 hours prior to surgery.   Do not bring valuables to the hospital.  Contacts, dentures or bridgework may not be worn into surgery.  Leave suitcase in the car. After surgery it may be brought to your room.  For patients admitted to the hospital, checkout time is 11:00 AM the day of discharge.   Patients discharged the day of surgery will not be allowed to drive home.  Special Instructions: Shower using CHG 2 nights before surgery and the night before surgery.  If you shower the day of surgery use CHG.  Use special wash - you have one bottle of CHG for all showers.  You should use approximately 1/3 of the bottle for each shower.   Please read over the following fact sheets that you were given: Pain Booklet, Coughing and Deep Breathing, MRSA Information and Surgical Site Infection Prevention

## 2012-10-04 NOTE — Progress Notes (Signed)
Pt doesn't have a cardiologist  Denies ever having an echo/stress test/heart cath  Pt doesn't have a medical MD  Denies ekg or cxr being done within last yr

## 2012-10-13 MED ORDER — FLEET ENEMA 7-19 GM/118ML RE ENEM: 1.0000 | ENEMA | Freq: Once | RECTAL | Status: DC

## 2012-10-14 ENCOUNTER — Ambulatory Visit (HOSPITAL_COMMUNITY): Payer: PRIVATE HEALTH INSURANCE | Admitting: Anesthesiology

## 2012-10-14 ENCOUNTER — Encounter (HOSPITAL_COMMUNITY): Payer: Self-pay | Admitting: Anesthesiology

## 2012-10-14 ENCOUNTER — Ambulatory Visit (HOSPITAL_COMMUNITY)
Admission: RE | Admit: 2012-10-14 | Discharge: 2012-10-14 | Disposition: A | Discharge status: Home / Self Care | Payer: PRIVATE HEALTH INSURANCE | Source: Ambulatory Visit | Attending: General Surgery | Admitting: General Surgery

## 2012-10-14 ENCOUNTER — Encounter (HOSPITAL_COMMUNITY)
Admission: RE | Disposition: A | Discharge status: Home / Self Care | Payer: Self-pay | Source: Ambulatory Visit | Attending: General Surgery

## 2012-10-14 ENCOUNTER — Encounter (HOSPITAL_COMMUNITY): Payer: Self-pay | Admitting: *Deleted

## 2012-10-14 DIAGNOSIS — K648 Other hemorrhoids: Secondary | ICD-10-CM | POA: Insufficient documentation

## 2012-10-14 DIAGNOSIS — Z01810 Encounter for preprocedural cardiovascular examination: Secondary | ICD-10-CM | POA: Insufficient documentation

## 2012-10-14 DIAGNOSIS — K644 Residual hemorrhoidal skin tags: Secondary | ICD-10-CM | POA: Insufficient documentation

## 2012-10-14 DIAGNOSIS — J4489 Other specified chronic obstructive pulmonary disease: Secondary | ICD-10-CM | POA: Insufficient documentation

## 2012-10-14 DIAGNOSIS — J449 Chronic obstructive pulmonary disease, unspecified: Secondary | ICD-10-CM | POA: Insufficient documentation

## 2012-10-14 DIAGNOSIS — Z01818 Encounter for other preprocedural examination: Secondary | ICD-10-CM | POA: Insufficient documentation

## 2012-10-14 DIAGNOSIS — Z01812 Encounter for preprocedural laboratory examination: Secondary | ICD-10-CM | POA: Insufficient documentation

## 2012-10-14 HISTORY — PX: HEMORRHOIDECTOMY WITH HEMORRHOID BANDING: SHX5633

## 2012-10-14 HISTORY — PX: EXAMINATION UNDER ANESTHESIA: SHX1540

## 2012-10-14 SURGERY — EXAM UNDER ANESTHESIA
Anesthesia: General | Site: Rectum | Wound class: Clean Contaminated

## 2012-10-14 MED ORDER — FENTANYL CITRATE 0.05 MG/ML IJ SOLN
INTRAMUSCULAR | Status: DC | PRN
  Administered 2012-10-14 (×3): 50 ug via INTRAVENOUS

## 2012-10-14 MED ORDER — ROCURONIUM BROMIDE 100 MG/10ML IV SOLN
INTRAVENOUS | Status: DC | PRN
  Administered 2012-10-14: 20 mg via INTRAVENOUS

## 2012-10-14 MED ORDER — CEFAZOLIN SODIUM-DEXTROSE 2-3 GM-% IV SOLR
INTRAVENOUS | Status: AC
  Filled 2012-10-14: qty 50

## 2012-10-14 MED ORDER — NEOSTIGMINE METHYLSULFATE 1 MG/ML IJ SOLN
INTRAMUSCULAR | Status: DC | PRN
  Administered 2012-10-14: 1 mg via INTRAVENOUS

## 2012-10-14 MED ORDER — MIDAZOLAM HCL 5 MG/5ML IJ SOLN
INTRAMUSCULAR | Status: DC | PRN
  Administered 2012-10-14: 2 mg via INTRAVENOUS

## 2012-10-14 MED ORDER — HEMOSTATIC AGENTS (NO CHARGE) OPTIME
TOPICAL | Status: DC | PRN
  Administered 2012-10-14: 1 via TOPICAL

## 2012-10-14 MED ORDER — HYDROMORPHONE HCL PF 1 MG/ML IJ SOLN: 0.2500 mg | INTRAMUSCULAR | Status: DC | PRN

## 2012-10-14 MED ORDER — GLYCOPYRROLATE 0.2 MG/ML IJ SOLN
INTRAMUSCULAR | Status: DC | PRN
  Administered 2012-10-14: 0.2 mg via INTRAVENOUS

## 2012-10-14 MED ORDER — BUPIVACAINE-EPINEPHRINE (PF) 0.5% -1:200000 IJ SOLN
INTRAMUSCULAR | Status: AC
  Filled 2012-10-14: qty 10

## 2012-10-14 MED ORDER — HYDROCODONE-ACETAMINOPHEN 5-325 MG PO TABS: 1.0000 | ORAL_TABLET | Freq: Four times a day (QID) | ORAL | Status: DC | PRN

## 2012-10-14 MED ORDER — PROPOFOL 10 MG/ML IV BOLUS
INTRAVENOUS | Status: DC | PRN
  Administered 2012-10-14: 20 mg via INTRAVENOUS
  Administered 2012-10-14: 160 mg via INTRAVENOUS

## 2012-10-14 MED ORDER — BUPIVACAINE-EPINEPHRINE 0.5% -1:200000 IJ SOLN
INTRAMUSCULAR | Status: DC | PRN
  Administered 2012-10-14: 30 mL

## 2012-10-14 MED ORDER — BUPIVACAINE LIPOSOME 1.3 % IJ SUSP
INTRAMUSCULAR | Status: DC | PRN
  Administered 2012-10-14: 20 mL

## 2012-10-14 MED ORDER — LIDOCAINE HCL (CARDIAC) 20 MG/ML IV SOLN
INTRAVENOUS | Status: DC | PRN
  Administered 2012-10-14: 70 mg via INTRAVENOUS

## 2012-10-14 MED ORDER — LACTATED RINGERS IV SOLN
INTRAVENOUS | Status: DC
  Administered 2012-10-14: 10:00:00 via INTRAVENOUS

## 2012-10-14 MED ORDER — LACTATED RINGERS IV SOLN
INTRAVENOUS | Status: DC | PRN
  Administered 2012-10-14 (×2): via INTRAVENOUS

## 2012-10-14 MED ORDER — ONDANSETRON HCL 4 MG/2ML IJ SOLN: 4.0000 mg | Freq: Once | INTRAMUSCULAR | Status: DC | PRN

## 2012-10-14 MED ORDER — 0.9 % SODIUM CHLORIDE (POUR BTL) OPTIME
TOPICAL | Status: DC | PRN
  Administered 2012-10-14: 1000 mL

## 2012-10-14 MED ORDER — BUPIVACAINE LIPOSOME 1.3 % IJ SUSP
20.0000 mL | Freq: Once | INTRAMUSCULAR | Status: DC
  Filled 2012-10-14: qty 20

## 2012-10-14 SURGICAL SUPPLY — 54 items
APL SKNCLS STERI-STRIP NONHPOA (GAUZE/BANDAGES/DRESSINGS) ×1
BENZOIN TINCTURE PRP APPL 2/3 (GAUZE/BANDAGES/DRESSINGS) ×1 IMPLANT
BLADE SURG 15 STRL LF DISP TIS (BLADE) ×1 IMPLANT
BLADE SURG 15 STRL SS (BLADE) ×2
CANISTER SUCTION 2500CC (MISCELLANEOUS) ×2 IMPLANT
CLOTH BEACON ORANGE TIMEOUT ST (SAFETY) ×2 IMPLANT
CONT SPEC 4OZ CLIKSEAL STRL BL (MISCELLANEOUS) ×1 IMPLANT
COVER SURGICAL LIGHT HANDLE (MISCELLANEOUS) ×2 IMPLANT
DECANTER SPIKE VIAL GLASS SM (MISCELLANEOUS) ×1 IMPLANT
DRAPE LAPAROTOMY T 102X78X121 (DRAPES) ×1 IMPLANT
DRAPE PROXIMA HALF (DRAPES) IMPLANT
DRAPE UTILITY 15X26 W/TAPE STR (DRAPE) ×4 IMPLANT
DRSG PAD ABDOMINAL 8X10 ST (GAUZE/BANDAGES/DRESSINGS) ×2 IMPLANT
ELECT CAUTERY BLADE 6.4 (BLADE) ×2 IMPLANT
ELECT REM PT RETURN 9FT ADLT (ELECTROSURGICAL) ×2
ELECTRODE REM PT RTRN 9FT ADLT (ELECTROSURGICAL) ×1 IMPLANT
GAUZE SPONGE 4X4 16PLY XRAY LF (GAUZE/BANDAGES/DRESSINGS) ×1 IMPLANT
GLOVE BIO SURGEON STRL SZ8 (GLOVE) ×1 IMPLANT
GLOVE BIOGEL M STRL SZ7.5 (GLOVE) ×2 IMPLANT
GLOVE BIOGEL PI IND STRL 8 (GLOVE) ×2 IMPLANT
GLOVE BIOGEL PI INDICATOR 8 (GLOVE) ×2
GLOVE SURG SS PI 8.0 STRL IVOR (GLOVE) ×1 IMPLANT
GOWN PREVENTION PLUS XXLARGE (GOWN DISPOSABLE) ×3 IMPLANT
GOWN STRL NON-REIN LRG LVL3 (GOWN DISPOSABLE) ×2 IMPLANT
KIT BASIN OR (CUSTOM PROCEDURE TRAY) ×2 IMPLANT
KIT ROOM TURNOVER OR (KITS) ×2 IMPLANT
NDL HYPO 25GX1X1/2 BEV (NEEDLE) ×1 IMPLANT
NDL HYPO 25X1 1.5 SAFETY (NEEDLE) ×1 IMPLANT
NEEDLE HYPO 25GX1X1/2 BEV (NEEDLE) ×2 IMPLANT
NEEDLE HYPO 25X1 1.5 SAFETY (NEEDLE) IMPLANT
NS IRRIG 1000ML POUR BTL (IV SOLUTION) ×2 IMPLANT
PACK GENERAL/GYN (CUSTOM PROCEDURE TRAY) ×2 IMPLANT
PACK LITHOTOMY IV (CUSTOM PROCEDURE TRAY) IMPLANT
PACK SURGICAL SETUP 50X90 (CUSTOM PROCEDURE TRAY) IMPLANT
PAD ARMBOARD 7.5X6 YLW CONV (MISCELLANEOUS) ×5 IMPLANT
PENCIL BUTTON HOLSTER BLD 10FT (ELECTRODE) ×1 IMPLANT
SHEARS HARMONIC 9CM CVD (BLADE) ×1 IMPLANT
SPECIMEN JAR SMALL (MISCELLANEOUS) ×1 IMPLANT
SPONGE GAUZE 4X4 12PLY (GAUZE/BANDAGES/DRESSINGS) ×2 IMPLANT
SPONGE HEMORRHOID 8X3CM (HEMOSTASIS) ×1 IMPLANT
SPONGE SURGIFOAM ABS GEL 100 (HEMOSTASIS) IMPLANT
SPONGE SURGIFOAM ABS GEL 12-7 (HEMOSTASIS) IMPLANT
SURGILUBE 2OZ TUBE FLIPTOP (MISCELLANEOUS) ×2 IMPLANT
SUT CHROMIC 2 0 SH (SUTURE) IMPLANT
SUT CHROMIC 3 0 SH 27 (SUTURE) IMPLANT
SYR 20CC LL (SYRINGE) ×1 IMPLANT
SYR CONTROL 10ML LL (SYRINGE) ×2 IMPLANT
TOWEL OR 17X24 6PK STRL BLUE (TOWEL DISPOSABLE) ×2 IMPLANT
TOWEL OR 17X26 10 PK STRL BLUE (TOWEL DISPOSABLE) ×2 IMPLANT
TRAY PROCTOSCOPIC FIBER OPTIC (SET/KITS/TRAYS/PACK) ×1 IMPLANT
TUBE CONNECTING 12X1/4 (SUCTIONS) ×1 IMPLANT
UNDERPAD 30X30 INCONTINENT (UNDERPADS AND DIAPERS) IMPLANT
WATER STERILE IRR 1000ML POUR (IV SOLUTION) ×1 IMPLANT
YANKAUER SUCT BULB TIP NO VENT (SUCTIONS) ×1 IMPLANT

## 2012-10-14 NOTE — Progress Notes (Signed)
Report given to kay rn as caregvier

## 2012-10-14 NOTE — Anesthesia Postprocedure Evaluation (Signed)
Anesthesia Post Note  Patient: Tracy [name]  Procedure(s) Performed: Procedure(s) (LRB): EXAM UNDER ANESTHESIA (N/A) HEMORRHOIDECTOMY WITH HEMORRHOID BANDING (N/A)  Anesthesia type: general  Patient location: PACU  Post pain: Pain level controlled  Post assessment: Patient's Cardiovascular Status Stable  Last Vitals:  Filed Vitals:   10/14/12 1300  BP: 124/78  Pulse: 76  Temp:   Resp: 16    Post vital signs: Reviewed and stable  Level of consciousness: sedated  Complications: No apparent anesthesia complications

## 2012-10-14 NOTE — Op Note (Signed)
Tracy [name], Tracy [name]              ACCOUNT NO.:  [id number]  MEDICAL RECORD NO.:  [id number]  LOCATION:  [facility]                         FACILITY:  [facility]  PHYSICIAN:  [name]. Andrey Campanile, MD, FACSDATE OF BIRTH:  27-Sep-1951  DATE OF PROCEDURE:  10/14/2012 DATE OF DISCHARGE:  10/14/2012                              OPERATIVE REPORT   PREOPERATIVE DIAGNOSIS:  Internal and external hemorrhoids.  POSTOPERATIVE DIAGNOSES: 1. Large anterior midline internal and external hemorrhoid. 2. Left lateral internal hemorrhoid. 3. Right lateral internal hemorrhoid.  PROCEDURES EXAM UNDER ANESTHESIA: 1. Excisional hemorrhoidectomy of large anterior midline internal/external hemorrhoid. 2. Hemorrhoidal banding x2.  ANESTHESIA:  General plus 20 mL of Exparel.  ESTIMATED BLOOD LOSS:  Minimal.  SPECIMENS:  Anterior midline internal-external hemorrhoid.  COMPLICATIONS:  None immediately apparent.  INDICATIONS FOR PROCEDURE:  The patient is a very pleasant 61 year old Caucasian female who has had a longstanding problem with hemorrhoids despite really good bowel habits.  She has daily bowel movements and take supplemental fiber.  Nonetheless, she does have a large internal- external hemorrhoid that will spontaneously reduce after having a bowel movement.  She desired surgical intervention.  We discussed the risks and benefits of surgery including, but not limited to, bleeding, infection, injury to surrounding structures, incontinence, urinary retention, pelvic sepsis, blood clot formation, anesthesia risk, pneumonia, pulmonary complications and a typical postoperative course. She elected to proceed to the operating room.  DESCRIPTION OF PROCEDURE:  She was taken to the operating room 8 at [facility].  General endotracheal anesthesia was established.  Sequential compression devices have been placed.  She was then placed in a  prone Jackknife position with the appropriate padding under  her arms and legs and head neck.  Her buttocks were taped apart. Her perineum and anus was prepped with Betadine.  A surgical time-out was performed.  First I did a visual inspection.  There was  redundant amount of nonthrombosed internal and external hemorrhoidal tissue in anterior midline position.  Digital rectal exam was performed, which demonstrated no palpable masses.  I inserted a second finger.  Then I placed anoscope into her anus.  I did a circumferential inspection.  There was a large anterior midline internal and external hemorrhoid.  There was a small left lateral internal hemorrhoid and somewhat larger internal hemorrhoid in the right lateral position.  I injected about 3 mL of 0.5% Marcaine in the anterior midline position for hemostasis.  Then using a 15 blade, I made a V-shaped incision incorporating some of the external hemorrhoidal tissue.  Then using hemostat, I lifted up and tunneled underneath the mucosa and submucosa, making sure not to injure the underlying sphincter muscle.  The hemorrhoid was lifted up.  I then excised the hemorrhoid in its entirety using a Harmonic scalpel.  There was excellent hemostasis.  I elected to leave this open as opposed to close the defect.  I then turned my attention to the right lateral internal hemorrhoid.  I was able to grasp it with a grasper and then fired the hemorrhoidal banding.  I then placed 2 rubber bands across the base of the internal hemorrhoid.  I then turned my attention to the left  internal hemorrhoid and placed 2 rubber bands across the base of that hemorrhoid.  At this point, there was no evidence of bleeding, 20 mL of Exparel was injected in the regional fashion.  A roll of Gelfoam was placed into the rectum followed by 4x4s and mesh panties.  The patient was placed back in the supine position, extubated and brought to the recovery room.  All needle, instrument, sponge counts were correct x2. There were no  immediate complications.  The patient tolerated the procedure well.     [name]. Andrey Campanile, MD, FACS     EMW/MEDQ  D:  10/14/2012  T:  10/14/2012  Job:  [id number]

## 2012-10-14 NOTE — H&P (Signed)
Tracy [name] is an 61 y.o. female.   Chief Complaint: here for surgery HPI: 61 year old Caucasian female referred by Dr. Hyacinth Meeker for evaluation of hemorrhoidal problems. The patient states that she has had hemorrhoidal problems for years ever since she had her children. She states that she has a bowel movement at least every other day if not every day. She doesn't strain with some bowel movements. She has noticed some occasional blood on the toilet paper. She also has pain with bowel movements occasionally if she has multiple bowel movements a day. She states that the hemorrhoid will protrude when she has a bowel movement but it will go back in spontaneously. She denies any perianal itching or burning. She denies any fecal or urinary incontinence. She states that she drinks about 3 glasses of water a day. She denies being a bathroom reader. She denies any previous hemorrhoidal procedures. She denies any weight loss. She denies any family history of colon or rectal cancer. She has not had a colonoscopy.  PMHx, PSHx, SOCHx, FAMHx, ALL reviewed and unchanged   Past Medical History  Diagnosis Date  . Asthma   . COPD (chronic obstructive pulmonary disease) 2011    FeV1 31% predicted FeV1/FVX 47 %  . Emphysema of lung   . Seasonal allergies     takes Claritin daily prn  . Hemorrhoids   . Vitamin D deficiency     takes Vit d every 14 days    Past Surgical History  Procedure Date  . Tonsillectomy age 57     recurrent otitis media  . Foot surgery     left bunionectomy    Family History  Problem Relation Age of Onset  . Other Father     Larey Seat in snow, broke ankle, blood clot to lungs  . Heart murmur Sister   . Other Brother     h/o being hit by lightening  . Parkinsonism Brother   . Asthma Son   . Allergies Son   . Otitis media Son   . Other Sister     CML  . Asthma Son   . Allergies Son    Social History:  reports that she has quit smoking. Her smoking use included Cigarettes. She  has a 10 pack-year smoking history. She has never used smokeless tobacco. She reports that she drinks alcohol. She reports that she does not use illicit drugs.  Allergies:  Allergies  Allergen Reactions  . Codeine Swelling    Medications Prior to Admission  Medication Sig Dispense Refill  . albuterol (PROVENTIL) (2.5 MG/3ML) 0.083% nebulizer solution Take 3 mLs (2.5 mg total) by nebulization 4 (four) times daily as needed. DX:  496  120 mL  3  . CALCIUM PO Take 1 tablet by mouth daily.       . Fluticasone-Salmeterol (ADVAIR DISKUS) 250-50 MCG/DOSE AEPB Inhale 1 puff into the lungs 2 (two) times daily.  60 each  3  . loratadine-pseudoephedrine (CLARITIN-D 24-HOUR) 10-240 MG per 24 hr tablet Take 1 tablet by mouth daily as needed. For allergies      . tiotropium (SPIRIVA HANDIHALER) 18 MCG inhalation capsule Place 1 capsule (18 mcg total) into inhaler and inhale daily.  30 capsule  3  . Vitamin D, Ergocalciferol, (DRISDOL) 50000 UNITS CAPS Take 1 capsule by mouth every 14 (fourteen) days.       Marland Kitchen loratadine (CLARITIN) 10 MG tablet Take 10 mg by mouth as needed. For allergies  No results found for this or any previous visit (from the past 48 hour(s)). No results found.  Review of Systems  Constitutional: Negative for fever, chills and weight loss.  HENT: Negative for hearing loss.   Respiratory: Negative for sputum production and wheezing.   Cardiovascular: Negative for chest pain, palpitations and orthopnea.  Gastrointestinal: Negative for nausea, vomiting and abdominal pain.  Neurological: Negative for dizziness, tingling, seizures, loss of consciousness and headaches.  All other systems reviewed and are negative.    Blood pressure 154/90, pulse 82, temperature 97.5 F (36.4 C), temperature source Oral, resp. rate 18, SpO2 99.00%. Physical Exam  Vitals reviewed. Constitutional: She is oriented to person, place, and time. She appears well-developed and well-nourished. No  distress.  HENT:  Head: Normocephalic and atraumatic.  Right Ear: External ear normal.  Left Ear: External ear normal.  Eyes: Conjunctivae normal are normal. No scleral icterus.  Neck: No tracheal deviation present. No thyromegaly present.  Cardiovascular: Normal rate.   Respiratory: Effort normal. No respiratory distress.  GI: Soft. She exhibits no distension.  Musculoskeletal: She exhibits no edema and no tenderness.  Neurological: She is alert and oriented to person, place, and time.  Skin: Skin is warm and dry. She is not diaphoretic.  Psychiatric: She has a normal mood and affect. Her behavior is normal. Judgment and thought content normal.     Assessment/Plan To OR for EUA, hemorrhoidal banding vs excisional hemorrhoidectomy  All questions asked and answered.  Tracy [name]. Andrey Campanile, MD, FACS General, Bariatric, & Minimally Invasive Surgery [facility] Surgery, [place]   [facility] M 10/14/2012, 11:47 AM

## 2012-10-14 NOTE — Transfer of Care (Addendum)
Immediate Anesthesia Transfer of Care Note  Patient: Tracy [name]  Procedure(s) Performed: Procedure(s) (LRB) with comments: EXAM UNDER ANESTHESIA (N/A) - rectal exam under anesthesia excisional hemorrhoidectomy hemorrhoidal banding x two HEMORRHOIDECTOMY WITH HEMORRHOID BANDING (N/A)  Patient Location: PACU  Anesthesia Type:General  Level of Consciousness: awake  Airway & Oxygen Therapy: Patient Spontanous Breathing and Patient connected to nasal cannula oxygen  Post-op Assessment: Report given to PACU RN and Post -op Vital signs reviewed and stable  Post vital signs: Reviewed and stable  Complications: No apparent anesthesia complications

## 2012-10-14 NOTE — Brief Op Note (Signed)
10/14/2012  12:39 PM  PATIENT:  Tracy [name]  61 y.o. female  PRE-OPERATIVE DIAGNOSIS:  internal external hemorrhoids   POST-OPERATIVE DIAGNOSIS:  internal external hemorrhoids   PROCEDURE:  Procedure(s) (LRB) with comments: EXAM UNDER ANESTHESIA (N/A) - rectal exam under anesthesia excisional hemorrhoidectomy hemorrhoidal banding HEMORRHOIDECTOMY WITH HEMORRHOID BANDING (N/A)  SURGEON:  Surgeon(s) and Role:    * Atilano Ina, MD,FACS - Primary  PHYSICIAN ASSISTANT: none  ASSISTANTS: none   ANESTHESIA:   general and 20cc Exparel  EBL:  Total I/O In: 1600 [I.V.:1600] Out: 10 [Blood:10]  BLOOD ADMINISTERED:none  DRAINS: none   LOCAL MEDICATIONS USED:  OTHER 20cc exparel  SPECIMEN:  Source of Specimen:  anterior midline int/ext hemorrhoid  DISPOSITION OF SPECIMEN:  PATHOLOGY  COUNTS:  YES  TOURNIQUET:  * No tourniquets in log *  DICTATION: .Other Dictation: Dictation Number [id number]  PLAN OF CARE: Discharge to home after PACU  PATIENT DISPOSITION:  PACU - hemodynamically stable.   Delay start of Pharmacological VTE agent (>24hrs) due to surgical blood loss or risk of bleeding: not applicable  [name]. Andrey Campanile, MD, FACS General, Bariatric, & Minimally Invasive Surgery [facility], [place]

## 2012-10-14 NOTE — Anesthesia Preprocedure Evaluation (Addendum)
Anesthesia Evaluation  Patient identified by MRN, date of birth, ID band Patient awake    Reviewed: Allergy & Precautions, H&P , NPO status , Patient's Chart, lab work & pertinent test results  Airway Mallampati: I TM Distance: >3 FB Neck ROM: full    Dental  (+) Dental Advisory Given   Pulmonary asthma , COPDformer smoker,          Cardiovascular + DOE Rhythm:regular Rate:Normal     Neuro/Psych    GI/Hepatic   Endo/Other    Renal/GU      Musculoskeletal   Abdominal   Peds  Hematology   Anesthesia Other Findings   Reproductive/Obstetrics                         Anesthesia Physical Anesthesia Plan  ASA: III  Anesthesia Plan: General   Post-op Pain Management:    Induction: Intravenous  Airway Management Planned: Oral ETT  Additional Equipment:   Intra-op Plan:   Post-operative Plan: Extubation in OR  Informed Consent: I have reviewed the patients History and Physical, chart, labs and discussed the procedure including the risks, benefits and alternatives for the proposed anesthesia with the patient or authorized representative who has indicated his/her understanding and acceptance.     Plan Discussed with: CRNA, Anesthesiologist and Surgeon  Anesthesia Plan Comments:         Anesthesia Quick Evaluation

## 2012-10-14 NOTE — Anesthesia Procedure Notes (Signed)
Procedure Name: Intubation Date/Time: 10/14/2012 12:06 PM Performed by: Alanda Amass A Pre-anesthesia Checklist: Patient identified, Timeout performed, Emergency Drugs available, Suction available and Patient being monitored Patient Re-evaluated:Patient Re-evaluated prior to inductionOxygen Delivery Method: Circle system utilized Preoxygenation: Pre-oxygenation with 100% oxygen Intubation Type: IV induction Ventilation: Mask ventilation without difficulty Laryngoscope Size: Mac and 3 Grade View: Grade I Tube type: Oral Tube size: 7.5 mm Number of attempts: 1 Airway Equipment and Method: Stylet Placement Confirmation: ETT inserted through vocal cords under direct vision,  breath sounds checked- equal and bilateral and positive ETCO2 Secured at: 21 cm Tube secured with: Tape Dental Injury: Teeth and Oropharynx as per pre-operative assessment

## 2012-10-17 ENCOUNTER — Encounter (HOSPITAL_COMMUNITY): Payer: Self-pay | Admitting: General Surgery

## 2012-11-03 ENCOUNTER — Encounter (INDEPENDENT_AMBULATORY_CARE_PROVIDER_SITE_OTHER): Payer: Self-pay | Admitting: General Surgery

## 2012-11-03 ENCOUNTER — Ambulatory Visit (INDEPENDENT_AMBULATORY_CARE_PROVIDER_SITE_OTHER): Payer: PRIVATE HEALTH INSURANCE | Admitting: General Surgery

## 2012-11-03 VITALS — BP 136/76 | HR 70 | Temp 97.0°F | Resp 18 | Ht 62.0 in | Wt 119.2 lb

## 2012-11-03 DIAGNOSIS — Z09 Encounter for follow-up examination after completed treatment for conditions other than malignant neoplasm: Secondary | ICD-10-CM

## 2012-11-03 NOTE — Patient Instructions (Addendum)
Drink 6-8 glasses of water/day Start taking Miralax daily Can apply Tuc's pads or moistened tea bag to hemorrhoidal tissue  GETTING TO GOOD BOWEL HEALTH. Irregular bowel habits such as constipation and diarrhea can lead to many problems over time.  Having one soft bowel movement a day is the most important way to prevent further problems.  The anorectal canal is designed to handle stretching and feces to safely manage our ability to get rid of solid waste (feces, poop, stool) out of our body.  BUT, hard constipated stools can act like ripping concrete bricks and diarrhea can be a burning fire to this very sensitive area of our body, causing inflamed hemorrhoids, anal fissures, increasing risk is perirectal abscesses, abdominal pain/bloating, an making irritable bowel worse.     The goal: ONE SOFT BOWEL MOVEMENT A DAY!  To have soft, regular bowel movements:    Drink at least 8 tall glasses of water a day.     Take plenty of fiber.  Fiber is the undigested part of plant food that passes into the colon, acting s "natures broom" to encourage bowel motility and movement.  Fiber can absorb and hold large amounts of water. This results in a larger, bulkier stool, which is soft and easier to pass. Work gradually over several weeks up to 6 servings a day of fiber (25g a day even more if needed) in the form of: o Vegetables -- Root (potatoes, carrots, turnips), leafy green (lettuce, salad greens, celery, spinach), or cooked high residue (cabbage, broccoli, etc) o Fruit -- Fresh (unpeeled skin & pulp), Dried (prunes, apricots, cherries, etc ),  or stewed ( applesauce)  o Whole grain breads, pasta, etc (whole wheat)  o Bran cereals    Bulking Agents -- This type of water-retaining fiber generally is easily obtained each day by one of the following:  o Psyllium bran -- The psyllium plant is remarkable because its ground seeds can retain so much water. This product is available as Metamucil, Konsyl, Effersyllium,  Per Diem Fiber, or the less expensive generic preparation in drug and health food stores. Although labeled a laxative, it really is not a laxative.  o Methylcellulose -- This is another fiber derived from wood which also retains water. It is available as Citrucel. o Polyethylene Glycol - and "artificial" fiber commonly called Miralax or Glycolax.  It is helpful for people with gassy or bloated feelings with regular fiber o Flax Seed - a less gassy fiber than psyllium   No reading or other relaxing activity while on the toilet. If bowel movements take longer than 5 minutes, you are too constipated   AVOID CONSTIPATION.  High fiber and water intake usually takes care of this.  Sometimes a laxative is needed to stimulate more frequent bowel movements, but    Laxatives are not a good long-term solution as it can wear the colon out. o Osmotics (Milk of Magnesia, Fleets phosphosoda, Magnesium citrate, MiraLax, GoLytely) are safer than  o Stimulants (Senokot, Castor Oil, Dulcolax, Ex Lax)    o Do not take laxatives for more than 7days in a row.    IF SEVERELY CONSTIPATED, try a Bowel Retraining Program: o Do not use laxatives.  o Eat a diet high in roughage, such as bran cereals and leafy vegetables.  o Drink six (6) ounces of prune or apricot juice each morning.  o Eat two (2) large servings of stewed fruit each day.  o Take one (1) heaping tablespoon of a psyllium-based bulking  agent twice a day. Use sugar-free sweetener when possible to avoid excessive calories.  o Eat a normal breakfast.  o Set aside 15 minutes after breakfast to sit on the toilet, but do not strain to have a bowel movement.  o If you do not have a bowel movement by the third day, use an enema and repeat the above steps.  o

## 2012-11-03 NOTE — Progress Notes (Signed)
Subjective:     Patient ID: Tracy [name], female   DOB: Oct 22, 1951, 61 y.o.   MRN: [id number]  HPI 61 year old Caucasian female comes in today for her first postoperative appointment after undergoing an exam under anesthesia, excision of large anterior midline internal-external hemorrhoid, hemorrhoidal banding x2 on November 1. She states that she did pretty well after surgery. She states that she had a few days of spotting. She also had pain and discomfort when having a bowel movement. However she really didn't take any narcotics after leaving the hospital. She states that she was doing well with daily bowel movements up until this past weekend. She states that she hasn't had a bowel movement since Sunday. She denies any dysuria. She denies any fever or chills. She denies any nausea or vomiting. She is taking a daily stool softener. She is not drinking much water  Review of Systems     Objective:   Physical Exam BP 136/76  Pulse 70  Temp 97 F (36.1 C) (Oral)  Resp 18  Ht 5\' 2"  (1.575 m)  Wt 119 lb 3.2 oz (54.069 kg)  BMI 21.80 kg/m2 Alert, no apparent distress Rectal-she has some swollen external hemorrhoidal tissue on the right lateral to anterior midline position. No cellulitis. Digital rectal exam deferred    Assessment:     Status post exam under anesthesia, excision of large anterior midline internal-external hemorrhoid, hemorrhoidal banding x2    Plan:     Overall I think she is doing well. I explained that the swelling is typical. I explained that it should decrease with time. However I did remind the patient that she will never be completely flat and smooth in that area. I encouraged her to increase her water intake to 6-8 glasses per day. Also encouraged her to start taking MiraLAX. She was encouraged to continue with stool softener. She was also given some additional educational material about getting too good bowel habits. Followup 8 weeks  [name]. Andrey Campanile, MD,  FACS General, Bariatric, & Minimally Invasive Surgery [facility], [place]

## 2012-11-14 ENCOUNTER — Other Ambulatory Visit: Payer: Self-pay | Admitting: Critical Care Medicine

## 2012-11-21 ENCOUNTER — Encounter: Payer: Self-pay | Admitting: Adult Health

## 2012-11-21 ENCOUNTER — Ambulatory Visit (INDEPENDENT_AMBULATORY_CARE_PROVIDER_SITE_OTHER): Payer: PRIVATE HEALTH INSURANCE | Admitting: Adult Health

## 2012-11-21 VITALS — BP 112/64 | HR 68 | Temp 98.7°F | Ht 62.0 in | Wt 123.6 lb

## 2012-11-21 DIAGNOSIS — J449 Chronic obstructive pulmonary disease, unspecified: Secondary | ICD-10-CM

## 2012-11-21 MED ORDER — BUDESONIDE-FORMOTEROL FUMARATE 160-4.5 MCG/ACT IN AERO: 2.0000 | INHALATION_SPRAY | Freq: Two times a day (BID) | RESPIRATORY_TRACT | Status: DC

## 2012-11-21 MED ORDER — AMOXICILLIN-POT CLAVULANATE 875-125 MG PO TABS: 1.0000 | ORAL_TABLET | Freq: Two times a day (BID) | ORAL | Status: AC

## 2012-11-21 MED ORDER — HYDROCODONE-HOMATROPINE 5-1.5 MG/5ML PO SYRP: 5.0000 mL | ORAL_SOLUTION | Freq: Four times a day (QID) | ORAL | Status: AC | PRN

## 2012-11-21 NOTE — Patient Instructions (Addendum)
Augmentin 875mg  Twice daily  For 10 days  Mucinex DM Twice daily  As needed  Cough/congestion  Saline nasal rinses As needed   Hydromet 1-2 tsp every 4-6 hr As needed  Cough-may make you sleepy  May change to Symbicort 160 2 puffs Twice daily   from Advair per Insurance coverage.  Please contact office for sooner follow up if symptoms do not improve or worsen or seek emergency care  follow up Dr. Delford Field  In 4-6 weeks and As needed

## 2012-11-21 NOTE — Progress Notes (Signed)
  Subjective:    Patient ID: Tracy [name], female    DOB: July 16, 1951, 61 y.o.   MRN: [id number]  HPI  61 y.o., white female, COPD with primary emphysematous component   September 08, 2010  f/u copd  No real changes  The pt notes dyspnea is stable and no new issues.   10/16/2011 No real changes in dyspnea over past one year.  No smoking.  No real cough.  No wheeze.  No chest pain. No qhs symptoms   11/21/2012 Acute OV  Complains of productive  cough with green/brown mucus, head congestion w/ PND, hoarseness, some wheezing and tightness in chest, f/c/s x1 week Insurance will not cover advair in 12/2012, needs alternative.  No hemoptysis , weight loss, edema or orthopnea.  Had CXR 09/2012 w/ NAD (preop for hemorrhoid surgery) . Did well post op .  OTC cold meds not working . Has sinus pain/;pressure  No ER or hospital visits since last ov.    Past Medical History  Diagnosis Date  . COPD (chronic obstructive pulmonary disease) 2011    FeV1 31% predicted FeV1/FVX 47 %  . Emphysema of lung        Review of Systems Constitutional:   No  weight loss, night sweats,  + Fevers, chills, fatigue, or  lassitude.  HEENT:   No headaches,  Difficulty swallowing,  Tooth/dental problems, or  Sore throat,                No sneezing, itching, ear ache + nasal congestion, post nasal drip,   CV:  No chest pain,  Orthopnea, PND, swelling in lower extremities, anasarca, dizziness, palpitations, syncope.   GI  No heartburn, indigestion, abdominal pain, nausea, vomiting, diarrhea, change in bowel habits, loss of appetite, bloody stools.   Resp:   No coughing up of blood.     No chest wall deformity  Skin: no rash or lesions.  GU: no dysuria, change in color of urine, no urgency or frequency.  No flank pain, no hematuria   MS:  No joint pain or swelling.  No decreased range of motion.  No back pain.  Psych:  No change in mood or affect. No depression or anxiety.  No memory loss.         Objective:   Physical Exam GEN: A/Ox3; pleasant , NAD, well nourished   HEENT:  [place]/AT,  EACs-clear, TMs-wnl, NOSE-clear, THROAT-clear drainage , max sinus tenderness , no lesions   NECK:  Supple w/ fair ROM; no JVD; normal carotid impulses w/o bruits; no thyromegaly or nodules palpated; no lymphadenopathy.  RESP  Few scattered rhonchi, diminshed BS in bases , no accessory muscle use, no dullness to percussion  CARD:  RRR, no m/r/g  , no peripheral edema, pulses intact, no cyanosis or clubbing.  GI:   Soft & nt; nml bowel sounds; no organomegaly or masses detected.  Musco: Warm bil, no deformities or joint swelling noted.   Neuro: alert, no focal deficits noted.    Skin: Warm, no lesions or rashes         Assessment & Plan:

## 2012-11-22 NOTE — Assessment & Plan Note (Addendum)
Exacerbation w/ associated acute sinusitis  advair to symbicort per insurance coverage   Plan Augmentin 875mg  Twice daily  For 10 days  Mucinex DM Twice daily  As needed  Cough/congestion  Saline nasal rinses As needed   Hydromet 1-2 tsp every 4-6 hr As needed  Cough-may make you sleepy  May change to Symbicort 160 2 puffs Twice daily   from Advair per Insurance coverage.  Please contact office for sooner follow up if symptoms do not improve or worsen or seek emergency care  follow up Dr. Delford Field  In 4-6 weeks and As needed

## 2012-11-25 ENCOUNTER — Ambulatory Visit: Payer: PRIVATE HEALTH INSURANCE | Admitting: Critical Care Medicine

## 2012-12-09 ENCOUNTER — Telehealth: Payer: Self-pay | Admitting: Critical Care Medicine

## 2012-12-09 MED ORDER — AMOXICILLIN-POT CLAVULANATE 875-125 MG PO TABS: 1.0000 | ORAL_TABLET | Freq: Two times a day (BID) | ORAL | Status: DC

## 2012-12-09 NOTE — Telephone Encounter (Signed)
Pt is aware of PW recommendations. Rx has been sent to [facility] [place].

## 2012-12-09 NOTE — Telephone Encounter (Signed)
Spoke with patient patient states symptoms she had from beginning of December are now returning.  Patient reports having prod cough w green mucus, pnd, hoarseness, and fever x 3 days.  Patient recently seen by TP 11/21/12 and is requesting another antibiotic for symptoms so that "things do not get worse".  Dr. Delford Field please advise, thank you. [facility] in [place]  Last OV 11/21/12 Patient Instructions     Augmentin 875mg  Twice daily For 10 days  Mucinex DM Twice daily As needed Cough/congestion  Saline nasal rinses As needed  Hydromet 1-2 tsp every 4-6 hr As needed Cough-may make you sleepy  May change to Symbicort 160 2 puffs Twice daily from Advair per Insurance coverage.  Please contact office for sooner follow up if symptoms do not improve or worsen or seek emergency care  follow up Dr. Delford Field In 4-6 weeks and As needed     Allergies  Allergen Reactions  . Codeine Swelling

## 2012-12-09 NOTE — Telephone Encounter (Signed)
Call in augmentin 875mg  bid x 7days She will need an OV in january

## 2012-12-12 ENCOUNTER — Other Ambulatory Visit: Payer: Self-pay | Admitting: Critical Care Medicine

## 2012-12-15 ENCOUNTER — Encounter (INDEPENDENT_AMBULATORY_CARE_PROVIDER_SITE_OTHER): Payer: PRIVATE HEALTH INSURANCE | Admitting: General Surgery

## 2012-12-16 ENCOUNTER — Other Ambulatory Visit: Payer: Self-pay | Admitting: Critical Care Medicine

## 2012-12-16 ENCOUNTER — Ambulatory Visit (INDEPENDENT_AMBULATORY_CARE_PROVIDER_SITE_OTHER): Payer: PRIVATE HEALTH INSURANCE | Admitting: Adult Health

## 2012-12-16 ENCOUNTER — Ambulatory Visit (INDEPENDENT_AMBULATORY_CARE_PROVIDER_SITE_OTHER)
Admission: RE | Admit: 2012-12-16 | Discharge: 2012-12-16 | Disposition: A | Discharge status: Home / Self Care | Payer: PRIVATE HEALTH INSURANCE | Source: Ambulatory Visit | Attending: Adult Health | Admitting: Adult Health

## 2012-12-16 ENCOUNTER — Telehealth: Payer: Self-pay | Admitting: Critical Care Medicine

## 2012-12-16 ENCOUNTER — Encounter: Payer: Self-pay | Admitting: Adult Health

## 2012-12-16 VITALS — BP 112/66 | HR 77 | Temp 96.8°F | Ht 62.0 in | Wt 122.4 lb

## 2012-12-16 DIAGNOSIS — J449 Chronic obstructive pulmonary disease, unspecified: Secondary | ICD-10-CM

## 2012-12-16 MED ORDER — AMOXICILLIN-POT CLAVULANATE 875-125 MG PO TABS: 1.0000 | ORAL_TABLET | Freq: Two times a day (BID) | ORAL | Status: AC

## 2012-12-16 MED ORDER — PREDNISONE 10 MG PO TABS: ORAL_TABLET | ORAL | Status: DC

## 2012-12-16 NOTE — Progress Notes (Signed)
Subjective:    Patient ID: Tracy [name], female    DOB: [birth year], 62 y.o.   MRN: [id number]  HPI   62 y.o., white female, COPD with primary emphysematous component   September 08, 2010  f/u copd  No real changes  The pt notes dyspnea is stable and no new issues.   10/16/2011 No real changes in dyspnea over past one year.  No smoking.  No real cough.  No wheeze.  No chest pain. No qhs symptoms   11/21/12  Acute OV  Complains of productive  cough with green/brown mucus, head congestion w/ PND, hoarseness, some wheezing and tightness in chest, f/c/s x1 week Insurance will not cover advair in 12/2012, needs alternative.  No hemoptysis , weight loss, edema or orthopnea.  Had CXR 09/2012 w/ NAD (preop for hemorrhoid surgery) . Did well post op .  OTC cold meds not working . Has sinus pain/;pressure  No ER or hospital visits since last ov.  >>augmentin x 10d   12/16/2012 Acute OV  Complains of cough and congestion with  sinus congestion/pressure, HA, clear nasal drainage, dry cough, wheezing, increased SOB, f/c/s x6days - will finish augmentin tonight . Patient was seen one month ago with a COPD exacerbation, and sinus infection She was treated with 10 days of Augmentin. Patient reports that she improved with total resolution of symptoms and return back to her baseline. Patient complains that at Christmas, that she was exposed to her grandchildren, who had upper respiratory infections, and runny nose, and shortly after she developed cough, congestion, and wheezing She denies any hemoptysis, orthopnea, PND, or leg swelling She was called in Augmentin one week ago with some improvement in symptoms, but not resolution. She only has 1 day left of her Augmentin.   Past Medical History  Diagnosis Date  . COPD (chronic obstructive pulmonary disease) 2011    FeV1 31% predicted FeV1/FVX 47 %  . Emphysema of lung        Review of Systems  Constitutional:   No  weight loss, night  sweats,  + Fevers, chills, fatigue, or  lassitude.  HEENT:   No headaches,  Difficulty swallowing,  Tooth/dental problems, or  Sore throat,                No sneezing, itching, ear ache + nasal congestion, post nasal drip,   CV:  No chest pain,  Orthopnea, PND, swelling in lower extremities, anasarca, dizziness, palpitations, syncope.   GI  No heartburn, indigestion, abdominal pain, nausea, vomiting, diarrhea, change in bowel habits, loss of appetite, bloody stools.   Resp:   No coughing up of blood.     No chest wall deformity  Skin: no rash or lesions.  GU: no dysuria, change in color of urine, no urgency or frequency.  No flank pain, no hematuria   MS:  No joint pain or swelling.  No decreased range of motion.  No back pain.  Psych:  No change in mood or affect. No depression or anxiety.  No memory loss.         Objective:   Physical Exam  GEN: A/Ox3; pleasant , NAD, well nourished   HEENT:  [place]/AT,  EACs-clear, TMs-wnl, NOSE-clear drianage, nontender sinus , THROAT-clear drainage , max sinus tenderness , no lesions   NECK:  Supple w/ fair ROM; no JVD; normal carotid impulses w/o bruits; no thyromegaly or nodules palpated; no lymphadenopathy.  RESP  Few scattered rhonchi, diminshed BS in bases , no  accessory muscle use, no dullness to percussion  CARD:  RRR, no m/r/g  , no peripheral edema, pulses intact, no cyanosis or clubbing.  GI:   Soft & nt; nml bowel sounds; no organomegaly or masses detected.  Musco: Warm bil, no deformities or joint swelling noted.   Neuro: alert, no focal deficits noted.    Skin: Warm, no lesions or rashes         Assessment & Plan:

## 2012-12-16 NOTE — Assessment & Plan Note (Signed)
Slow to resolve flare with sinusitis  Check xray   Plan  Extend Augmentin 875mg  Twice daily  For an additional 3 days  Mucinex DM Twice daily  As needed  Cough/congestion  Saline nasal rinses As needed   Prednisone taper over next week.   Please contact office for sooner follow up if symptoms do not improve or worsen or seek emergency care  follow up Dr. Delford Field  As planned this month.

## 2012-12-16 NOTE — Patient Instructions (Addendum)
Extend Augmentin 875mg  Twice daily  For an additional 3 days  Mucinex DM Twice daily  As needed  Cough/congestion  Saline nasal rinses As needed   Prednisone taper over next week.   Please contact office for sooner follow up if symptoms do not improve or worsen or seek emergency care  follow up Dr. Delford Field  As planned this month.

## 2012-12-16 NOTE — Telephone Encounter (Signed)
Called, spoke with pt.  Augmentin x 7 days was called in on 12/09/12.  Pt states she has one dose left but feels a little worse.  Reports increased SOB, chills, sweats, feels feverish (hasn't checked temp), has wheezing, chest tightness, body aches, and a cough.  Cough is prod at times with a small amount of light mucus.  No n/v/d.  Did not have flu vaccine this year.  OV scheduled with TP for today at 2 pm -- pt aware.

## 2012-12-20 NOTE — Progress Notes (Signed)
Quick Note:  Called spoke with patient, advised of cxr results / recs as stated by TP. Pt verbalized her understanding and denied any questions. ______ 

## 2013-01-05 ENCOUNTER — Ambulatory Visit (INDEPENDENT_AMBULATORY_CARE_PROVIDER_SITE_OTHER): Payer: PRIVATE HEALTH INSURANCE | Admitting: General Surgery

## 2013-01-05 ENCOUNTER — Encounter (INDEPENDENT_AMBULATORY_CARE_PROVIDER_SITE_OTHER): Payer: Self-pay | Admitting: General Surgery

## 2013-01-05 VITALS — BP 118/78 | HR 76 | Resp 16 | Ht 62.0 in | Wt 123.0 lb

## 2013-01-05 DIAGNOSIS — K644 Residual hemorrhoidal skin tags: Secondary | ICD-10-CM | POA: Insufficient documentation

## 2013-01-05 NOTE — Patient Instructions (Signed)
Call our office to schedule surgery [phone]  Hemorrhoids Hemorrhoids are enlarged (dilated) veins around the rectum. There are 2 types of hemorrhoids, and the type of hemorrhoid is determined by its location. Internal hemorrhoids occur in the veins just inside the rectum.They are usually not painful, but they may bleed.However, they may poke through to the outside and become irritated and painful. External hemorrhoids involve the veins outside the anus and can be felt as a painful swelling or hard lump near the anus.They are often itchy and may crack and bleed. Sometimes clots will form in the veins. This makes them swollen and painful. These are called thrombosed hemorrhoids. CAUSES Causes of hemorrhoids include:  Pregnancy. This increases the pressure in the hemorrhoidal veins.  Constipation.  Straining to have a bowel movement.  Obesity.  Heavy lifting or other activity that caused you to strain. TREATMENT Most of the time hemorrhoids improve in 1 to 2 weeks. However, if symptoms do not seem to be getting better or if you have a lot of rectal bleeding, your caregiver may perform a procedure to help make the hemorrhoids get smaller or remove them completely.Possible treatments include:  Rubber band ligation. A rubber band is placed at the base of the hemorrhoid to cut off the circulation.  Sclerotherapy. A chemical is injected to shrink the hemorrhoid.  Infrared light therapy. Tools are used to burn the hemorrhoid.  Hemorrhoidectomy. This is surgical removal of the hemorrhoid. HOME CARE INSTRUCTIONS   Increase fiber in your diet. Ask your caregiver about using fiber supplements.  Drink enough water and fluids to keep your urine clear or pale yellow.  Exercise regularly.  Go to the bathroom when you have the urge to have a bowel movement. Do not wait.  Avoid straining to have bowel movements.  Keep the anal area dry and clean.  Only take over-the-counter or  prescription medicines for pain, discomfort, or fever as directed by your caregiver. If your hemorrhoids are thrombosed:  Take warm sitz baths for 20 to 30 minutes, 3 to 4 times per day.  If the hemorrhoids are very tender and swollen, place ice packs on the area as tolerated. Using ice packs between sitz baths may be helpful. Fill a plastic bag with ice. Place a towel between the bag of ice and your skin.  Medicated creams and suppositories may be used or applied as directed.  Do not use a donut-shaped pillow or sit on the toilet for long periods. This increases blood pooling and pain. SEEK MEDICAL CARE IF:   You have increasing pain and swelling that is not controlled with your medicine.  You have uncontrolled bleeding.  You have difficulty or you are unable to have a bowel movement.  You have pain or inflammation outside the area of the hemorrhoids.  You have chills or an oral temperature above 102 F (38.9 C). MAKE SURE YOU:   Understand these instructions.  Will watch your condition.  Will get help right away if you are not doing well or get worse. Document Released: 11/27/2000 Document Revised: 02/22/2012 Document Reviewed: 11/10/2010 [facility] Patient Information 2013 [place], [place].

## 2013-01-05 NOTE — Progress Notes (Signed)
Subjective:     Patient ID: Tracy [name], female   DOB: 1951/01/18, 62 y.o.   MRN: [id number]  HPI 62 year old Caucasian female comes in for followup after undergoing exam under anesthesia, excisional hemorrhoidectomy of anterior internal hemorrhoid on November 1. I last saw her in the office on November 21. Since she was last seen she states that she's been relatively well with respect to her hemorrhoid. However she did develop a nasty cold and cough that she couldn't shake. She had to be placed on antibiotics and prednisone for which she just completed therapy. She states that her cough and cold have significantly improved. Otherwise she denies any pain with defecation. She states she is having regular bowel movements now. She denies any incontinence. She denies any perianal itching or burning. She denies any straining with defecation. However she states that she still has a significant external hemorrhoid that is interfering with her hygiene. Otherwise no medical issues  Review of Systems     Objective:   Physical Exam BP 118/78  Pulse 76  Resp 16  Ht 5\' 2"  (1.575 m)  Wt 123 lb (55.792 kg)  BMI 22.50 kg/m2 Alert, nad cta b/l Reg Soft, nt Rectal - large prolapsed nonthrombosed ext hemorrhoid in ant midline ext to right lat position Well healed mucosa in rt ant position. No significant int hemorrhoid on anoscopy    Assessment:     Status post exam under anesthesia, excision of large right anterior internal hemorrhoid November 2013 Large prolapse nonthrombosed external hemorrhoid    Plan:     The patient appears to have healed from her surgery in November. However she has a large prolapsed nonthrombosed external hemorrhoid that is interfering with her hygiene and she requests intervention. I explained to her that we can excise the external hemorrhoid in the operating room however the area will not be completely flat. They're always be a lump of tissue in that area. We discussed the  risk and benefits of surgery. We discussed the typical postoperative course. We discussed the importance of not becoming constipated after surgery. She has elected to proceed to the operating room for an exam under anesthesia, excision of large prolapsed external hemorrhoid in February versus early March.  [name]. Andrey Campanile, MD, FACS General, Bariatric, & Minimally Invasive Surgery [facility], [place]

## 2013-01-06 ENCOUNTER — Ambulatory Visit (INDEPENDENT_AMBULATORY_CARE_PROVIDER_SITE_OTHER): Payer: PRIVATE HEALTH INSURANCE | Admitting: Critical Care Medicine

## 2013-01-06 ENCOUNTER — Encounter: Payer: Self-pay | Admitting: Critical Care Medicine

## 2013-01-06 VITALS — BP 122/72 | HR 108 | Temp 97.0°F | Ht 62.0 in | Wt 122.0 lb

## 2013-01-06 DIAGNOSIS — J449 Chronic obstructive pulmonary disease, unspecified: Secondary | ICD-10-CM

## 2013-01-06 DIAGNOSIS — Z23 Encounter for immunization: Secondary | ICD-10-CM

## 2013-01-06 DIAGNOSIS — J4489 Other specified chronic obstructive pulmonary disease: Secondary | ICD-10-CM

## 2013-01-06 MED ORDER — BUDESONIDE-FORMOTEROL FUMARATE 160-4.5 MCG/ACT IN AERO: 2.0000 | INHALATION_SPRAY | Freq: Two times a day (BID) | RESPIRATORY_TRACT | Status: DC

## 2013-01-06 NOTE — Patient Instructions (Addendum)
Start Symbicort two puff twice daily when advair runs out Stay on spiriva A Flu vaccine and pneumovax was administered Return 4 months

## 2013-01-06 NOTE — Assessment & Plan Note (Signed)
Gold stage C. COPD with recent exacerbation now improved Plan  Maintain inhaled medications as prescribed  administer flu vaccine and Pneumovax

## 2013-01-06 NOTE — Progress Notes (Signed)
Subjective:    Patient ID: Tracy [name], female    DOB: February 24, 1951, 62 y.o.   MRN: [id number]  HPI   62 y.o., white female, COPD with primary emphysematous component   September 08, 2010  f/u copd  No real changes  The pt notes dyspnea is stable and no new issues.   10/16/2011 No real changes in dyspnea over past one year.  No smoking.  No real cough.  No wheeze.  No chest pain. No qhs symptoms   11/21/12  Acute OV  Complains of productive  cough with green/brown mucus, head congestion w/ PND, hoarseness, some wheezing and tightness in chest, f/c/s x1 week Insurance will not cover advair in 12/2012, needs alternative.  No hemoptysis , weight loss, edema or orthopnea.  Had CXR 09/2012 w/ NAD (preop for hemorrhoid surgery) . Did well post op .  OTC cold meds not working . Has sinus pain/;pressure  No ER or hospital visits since last ov.  >>augmentin x 10d   12/16/12 Acute OV  Complains of cough and congestion with  sinus congestion/pressure, HA, clear nasal drainage, dry cough, wheezing, increased SOB, f/c/s x6days - will finish augmentin tonight . Patient was seen one month ago with a COPD exacerbation, and sinus infection She was treated with 10 days of Augmentin. Patient reports that she improved with total resolution of symptoms and return back to her baseline. Patient complains that at Christmas, that she was exposed to her grandchildren, who had upper respiratory infections, and runny nose, and shortly after she developed cough, congestion, and wheezing She denies any hemoptysis, orthopnea, PND, or leg swelling She was called in Augmentin one week ago with some improvement in symptoms, but not resolution. She only has 1 day left of her Augmentin.  01/06/2013 No cough now, is better.  Notes min mucus.  No smoking .  No wheeze. Pt denies any significant sore throat, nasal congestion or excess secretions, fever, chills, sweats, unintended weight loss, pleurtic or exertional chest  pain, orthopnea PND, or leg swelling Pt denies any increase in rescue therapy over baseline, denies waking up needing it or having any early am or nocturnal exacerbations of coughing/wheezing/or dyspnea. Pt also denies any obvious fluctuation in symptoms with  weather or environmental change or other alleviating or aggravating factors     Past Medical History  Diagnosis Date  . COPD (chronic obstructive pulmonary disease) 2011    FeV1 31% predicted FeV1/FVX 47 %  . Emphysema of lung        Review of Systems  Constitutional:   No  weight loss, night sweats,  + Fevers, chills, fatigue, or  lassitude.  HEENT:   No headaches,  Difficulty swallowing,  Tooth/dental problems, or  Sore throat,                No sneezing, itching, ear ache + nasal congestion, post nasal drip,   CV:  No chest pain,  Orthopnea, PND, swelling in lower extremities, anasarca, dizziness, palpitations, syncope.   GI  No heartburn, indigestion, abdominal pain, nausea, vomiting, diarrhea, change in bowel habits, loss of appetite, bloody stools.   Resp:   No coughing up of blood.     No chest wall deformity  Skin: no rash or lesions.  GU: no dysuria, change in color of urine, no urgency or frequency.  No flank pain, no hematuria   MS:  No joint pain or swelling.  No decreased range of motion.  No back pain.  Psych:  No change in mood or affect. No depression or anxiety.  No memory loss.         Objective:   Physical Exam BP 122/72  Pulse 108  Temp 97 F (36.1 C) (Oral)  Ht 5\' 2"  (1.575 m)  Wt 122 lb (55.339 kg)  BMI 22.31 kg/m2  SpO2 92%  GEN: A/Ox3; pleasant , NAD, well nourished   HEENT:  [place]/AT,  EACs-clear, TMs-wnl, NOSE-clear drianage, nontender sinus , THROAT-clear drainage ,  No max sinus tenderness , no lesions   NECK:  Supple w/ fair ROM; no JVD; normal carotid impulses w/o bruits; no thyromegaly or nodules palpated; no lymphadenopathy.  RESP  No wheeze or rhonchi, no accessory  muscle use, no dullness to percussion  CARD:  RRR, no m/r/g  , no peripheral edema, pulses intact, no cyanosis or clubbing.  GI:   Soft & nt; nml bowel sounds; no organomegaly or masses detected.  Musco: Warm bil, no deformities or joint swelling noted.   Neuro: alert, no focal deficits noted.    Skin: Warm, no lesions or rashes      Assessment & Plan:   C O P D Gold stage C. COPD with recent exacerbation now improved Plan  Maintain inhaled medications as prescribed  administer flu vaccine and Pneumovax   Updated Medication List Outpatient Encounter Prescriptions as of 01/06/2013  Medication Sig Dispense Refill  . albuterol (PROVENTIL) (2.5 MG/3ML) 0.083% nebulizer solution Take 2.5 mg by nebulization 4 (four) times daily as needed. DX:  496      . CALCIUM PO Take 1 tablet by mouth daily.       Marland Kitchen loratadine-pseudoephedrine (CLARITIN-D 24-HOUR) 10-240 MG per 24 hr tablet Take 1 tablet by mouth daily as needed. For allergies      . tiotropium (SPIRIVA) 18 MCG inhalation capsule Place 18 mcg into inhaler and inhale daily.      . Vitamin D, Ergocalciferol, (DRISDOL) 50000 UNITS CAPS Take 1 capsule by mouth every 14 (fourteen) days.       . [DISCONTINUED] ADVAIR DISKUS 250-50 MCG/DOSE AEPB INHALE ONE PUFF BY MOUTH TWICE DAILY  60 each  2  . budesonide-formoterol (SYMBICORT) 160-4.5 MCG/ACT inhaler Inhale 2 puffs into the lungs 2 (two) times daily.  1 Inhaler  12  . [DISCONTINUED] budesonide-formoterol (SYMBICORT) 160-4.5 MCG/ACT inhaler Inhale 2 puffs into the lungs 2 (two) times daily.  1 Inhaler  12

## 2013-01-19 ENCOUNTER — Telehealth (INDEPENDENT_AMBULATORY_CARE_PROVIDER_SITE_OTHER): Payer: Self-pay | Admitting: General Surgery

## 2013-01-19 NOTE — Telephone Encounter (Signed)
Spoke with patient. She wanted to make sure that the plan was to trim the outside hemorrhoid. I made her aware that is what she is scheduled for but reiterated that she may never be flat in that area. She wanted to know if taking 5 days out of work would be enough. I told her she may be able to go back but she may still be uncomfortable. 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 I wrote a note and faxed to her work at [employer] date of 02/08/2013- pending no complications. Faxed to [phone]. Confirmation received. She will call with any other questions.

## 2013-01-19 NOTE — Telephone Encounter (Signed)
Message copied by Liliana Cline on Thu Jan 19, 2013  9:57 AM ------      Message from: Docia Chuck      Created: Wed Jan 18, 2013  5:04 PM      Regarding: Pt has questions about surgery       I have her on your calendar for 02/03/13 @ 730 @ [facility] as she needs a Friday.  She has questions on the surgery and would like for you to call her.            Thanks

## 2013-01-19 NOTE — Telephone Encounter (Signed)
Left message on machine for patient to call back and ask for me. Returning patient's call to answer her questions. Awaiting call back.

## 2013-02-03 ENCOUNTER — Other Ambulatory Visit (INDEPENDENT_AMBULATORY_CARE_PROVIDER_SITE_OTHER): Payer: Self-pay | Admitting: General Surgery

## 2013-02-03 DIAGNOSIS — K644 Residual hemorrhoidal skin tags: Secondary | ICD-10-CM

## 2013-02-03 HISTORY — PX: EXAMINATION UNDER ANESTHESIA: SHX1540

## 2013-02-06 ENCOUNTER — Telehealth (INDEPENDENT_AMBULATORY_CARE_PROVIDER_SITE_OTHER): Payer: Self-pay | Admitting: General Surgery

## 2013-02-06 NOTE — Telephone Encounter (Signed)
Spoke with patient she has po fu visit 02/24/13 @ 8:45

## 2013-02-24 ENCOUNTER — Ambulatory Visit (INDEPENDENT_AMBULATORY_CARE_PROVIDER_SITE_OTHER): Payer: PRIVATE HEALTH INSURANCE | Admitting: General Surgery

## 2013-02-24 ENCOUNTER — Encounter (INDEPENDENT_AMBULATORY_CARE_PROVIDER_SITE_OTHER): Payer: Self-pay | Admitting: General Surgery

## 2013-02-24 VITALS — BP 102/70 | HR 76 | Resp 14 | Ht 62.0 in | Wt 123.0 lb

## 2013-02-24 DIAGNOSIS — Z09 Encounter for follow-up examination after completed treatment for conditions other than malignant neoplasm: Secondary | ICD-10-CM

## 2013-02-24 NOTE — Progress Notes (Signed)
Subjective:     Patient ID: Tracy [name], female   DOB: 17-May-1951, 62 y.o.   MRN: [id number]  HPI 62 year old Caucasian female comes in today for her first postoperative visit. She underwent exam under anesthesia, excision of large anterior prolapsed external hemorrhoid on Feb 21st. She states that she did quite well after surgery. She denies any fever, chills, nausea, vomiting, dysuria. She reports that she is regular having daily bowel movements. She denies any pain with defecation. She denies any anal drainage.  Review of Systems     Objective:   Physical Exam BP 102/70  Pulse 76  Resp 14  Ht 5\' 2"  (1.575 m)  Wt 123 lb (55.792 kg)  BMI 22.49 kg/m2 NAD Rectal-external exam only visual inspection reveals no sign of cellulitis, fluctuance or induration. The anterior incision is almost completely healed. She has a little bit of swelling on the left lateral side.    Assessment:     S/p EUA, excision of prolapsed anterior external hemorrhoid    Plan:     She is healing quite well. I stressed the importance of maintaining daily bowel movements. We discussed the importance of drinking plenty of water and maintaining a high fiber diet. Followup in 8 weeks for formal anoscopy   [name]. Andrey Campanile, MD, FACS General, Bariatric, & Minimally Invasive Surgery [facility], [place]

## 2013-02-24 NOTE — Patient Instructions (Signed)
Keep up the good bowel habits (drinking plenty of water, high fiber diet)

## 2013-03-24 ENCOUNTER — Other Ambulatory Visit: Payer: Self-pay | Admitting: Critical Care Medicine

## 2013-04-20 ENCOUNTER — Encounter (INDEPENDENT_AMBULATORY_CARE_PROVIDER_SITE_OTHER): Payer: PRIVATE HEALTH INSURANCE | Admitting: General Surgery

## 2013-05-02 ENCOUNTER — Other Ambulatory Visit: Payer: Self-pay | Admitting: Critical Care Medicine

## 2013-05-15 ENCOUNTER — Encounter: Payer: Self-pay | Admitting: Critical Care Medicine

## 2013-05-15 ENCOUNTER — Ambulatory Visit (INDEPENDENT_AMBULATORY_CARE_PROVIDER_SITE_OTHER): Payer: PRIVATE HEALTH INSURANCE | Admitting: Critical Care Medicine

## 2013-05-15 VITALS — BP 142/90 | HR 84 | Temp 97.4°F | Ht 62.0 in | Wt 127.0 lb

## 2013-05-15 DIAGNOSIS — J449 Chronic obstructive pulmonary disease, unspecified: Secondary | ICD-10-CM

## 2013-05-15 DIAGNOSIS — Z23 Encounter for immunization: Secondary | ICD-10-CM

## 2013-05-15 DIAGNOSIS — J439 Emphysema, unspecified: Secondary | ICD-10-CM

## 2013-05-15 DIAGNOSIS — J438 Other emphysema: Secondary | ICD-10-CM

## 2013-05-15 MED ORDER — TIOTROPIUM BROMIDE MONOHYDRATE 18 MCG IN CAPS: ORAL_CAPSULE | RESPIRATORY_TRACT | Status: DC

## 2013-05-15 MED ORDER — BUDESONIDE-FORMOTEROL FUMARATE 160-4.5 MCG/ACT IN AERO: 2.0000 | INHALATION_SPRAY | Freq: Two times a day (BID) | RESPIRATORY_TRACT | Status: DC

## 2013-05-15 MED ORDER — FLUTICASONE PROPIONATE 50 MCG/ACT NA SUSP: 2.0000 | Freq: Every day | NASAL | Status: DC

## 2013-05-15 MED ORDER — ALBUTEROL SULFATE (2.5 MG/3ML) 0.083% IN NEBU: INHALATION_SOLUTION | RESPIRATORY_TRACT | Status: DC

## 2013-05-15 NOTE — Patient Instructions (Addendum)
No change in symbicort/spiriva Start flonase nasal spray two puff daily ea nostril A Tdap will be given Spirometry will be obtained Return 6 months

## 2013-05-15 NOTE — Assessment & Plan Note (Signed)
Gold stage C. COPD with associated allergic rhinitis Plan Start flonase daily A Tdap was given A spirometry was ordered

## 2013-05-15 NOTE — Progress Notes (Signed)
Subjective:    Patient ID: Tracy [name], female    DOB: 1951-07-11, 62 y.o.   MRN: [id number]  HPI  62 y.o. ., white female, COPD with primary emphysematous component   05/15/2013 Notes more allergy issues over past week. Pt notes more pndrip.  Claritin D alone. Using afrin OTC Not on an ICS nasal.  Pt notes h/a behind the eyes No real mucus.  Notes dyspnea is about the same      Review of Systems  Constitutional:   No  weight loss, night sweats,  + Fevers, chills, fatigue, or  lassitude.  HEENT:   No headaches,  Difficulty swallowing,  Tooth/dental problems, or  Sore throat,                No sneezing, itching, ear ache + nasal congestion, post nasal drip,   CV:  No chest pain,  Orthopnea, PND, swelling in lower extremities, anasarca, dizziness, palpitations, syncope.   GI  No heartburn, indigestion, abdominal pain, nausea, vomiting, diarrhea, change in bowel habits, loss of appetite, bloody stools.   Resp:   No coughing up of blood.     No chest wall deformity  Skin: no rash or lesions.  GU: no dysuria, change in color of urine, no urgency or frequency.  No flank pain, no hematuria   MS:  No joint pain or swelling.  No decreased range of motion.  No back pain.  Psych:  No change in mood or affect. No depression or anxiety.  No memory loss.         Objective:   Physical Exam BP 142/90  Pulse 84  Temp(Src) 97.4 F (36.3 C) (Oral)  Ht 5\' 2"  (1.575 m)  Wt 127 lb (57.607 kg)  BMI 23.22 kg/m2  SpO2 99%  GEN: A/Ox3; pleasant , NAD, well nourished   HEENT:  [place]/AT,  EACs-clear, TMs-wnl, NOSE-clear drianage, nontender sinus , THROAT-clear drainage ,  No max sinus tenderness , no lesions   NECK:  Supple w/ fair ROM; no JVD; normal carotid impulses w/o bruits; no thyromegaly or nodules palpated; no lymphadenopathy.  RESP  No wheeze or rhonchi, no accessory muscle use, no dullness to percussion  CARD:  RRR, no m/r/g  , no peripheral edema, pulses intact, no  cyanosis or clubbing.  GI:   Soft & nt; nml bowel sounds; no organomegaly or masses detected.  Musco: Warm bil, no deformities or joint swelling noted.   Neuro: alert, no focal deficits noted.    Skin: Warm, no lesions or rashes      Assessment & Plan:   Gold C Copd  Gold stage C. COPD with associated allergic rhinitis Plan Start flonase daily A Tdap was given A spirometry was ordered    Updated Medication List Outpatient Encounter Prescriptions as of 05/15/2013  Medication Sig Dispense Refill  . albuterol (PROVENTIL) (2.5 MG/3ML) 0.083% nebulizer solution Inhale one vial via nebulizer four times daily as needed dx code 496  150 mL  4  . budesonide-formoterol (SYMBICORT) 160-4.5 MCG/ACT inhaler Inhale 2 puffs into the lungs 2 (two) times daily.  1 Inhaler  12  . loratadine-pseudoephedrine (CLARITIN-D 24-HOUR) 10-240 MG per 24 hr tablet Take 1 tablet by mouth daily as needed. For allergies      . tiotropium (SPIRIVA HANDIHALER) 18 MCG inhalation capsule INHALE ONE CAPSULE BY MOUTH ONE TIME DAILY  30 capsule  6  . Vitamin D, Ergocalciferol, (DRISDOL) 50000 UNITS CAPS Take 1 capsule by mouth every 14 (fourteen)  days.       . [DISCONTINUED] albuterol (PROVENTIL) (2.5 MG/3ML) 0.083% nebulizer solution Inhale one vial via nebulizer four times daily as needed dx code 496  150 mL  4  . [DISCONTINUED] budesonide-formoterol (SYMBICORT) 160-4.5 MCG/ACT inhaler Inhale 2 puffs into the lungs 2 (two) times daily.  1 Inhaler  12  . [DISCONTINUED] CALCIUM PO Take 1 tablet by mouth daily.       . [DISCONTINUED] SPIRIVA HANDIHALER 18 MCG inhalation capsule INHALE ONE CAPSULE BY MOUTH ONE TIME DAILY  30 capsule  6  . fluticasone (FLONASE) 50 MCG/ACT nasal spray Place 2 sprays into the nose daily.  16 g  2   No facility-administered encounter medications on file as of 05/15/2013.

## 2013-05-25 ENCOUNTER — Encounter (INDEPENDENT_AMBULATORY_CARE_PROVIDER_SITE_OTHER): Payer: Self-pay | Admitting: General Surgery

## 2013-05-25 ENCOUNTER — Ambulatory Visit (INDEPENDENT_AMBULATORY_CARE_PROVIDER_SITE_OTHER): Payer: PRIVATE HEALTH INSURANCE | Admitting: General Surgery

## 2013-05-25 VITALS — BP 132/84 | HR 76 | Temp 98.4°F | Resp 16 | Ht 62.0 in | Wt 124.0 lb

## 2013-05-25 DIAGNOSIS — Z09 Encounter for follow-up examination after completed treatment for conditions other than malignant neoplasm: Secondary | ICD-10-CM

## 2013-05-25 NOTE — Patient Instructions (Signed)
You can stop taking the stool softner and miralax  GETTING TO GOOD BOWEL HEALTH. Irregular bowel habits such as constipation and diarrhea can lead to many problems over time.  Having one soft bowel movement a day is the most important way to prevent further problems.  The anorectal canal is designed to handle stretching and feces to safely manage our ability to get rid of solid waste (feces, poop, stool) out of our body.  BUT, hard constipated stools can act like ripping concrete bricks and diarrhea can be a burning fire to this very sensitive area of our body, causing inflamed hemorrhoids, anal fissures, increasing risk is perirectal abscesses, abdominal pain/bloating, an making irritable bowel worse.     The goal: ONE SOFT BOWEL MOVEMENT A DAY!  To have soft, regular bowel movements:    Drink at least 8 tall glasses of water a day.     Take plenty of fiber.  Fiber is the undigested part of plant food that passes into the colon, acting s "natures broom" to encourage bowel motility and movement.  Fiber can absorb and hold large amounts of water. This results in a larger, bulkier stool, which is soft and easier to pass. Work gradually over several weeks up to 6 servings a day of fiber (25g a day even more if needed) in the form of: o Vegetables -- Root (potatoes, carrots, turnips), leafy green (lettuce, salad greens, celery, spinach), or cooked high residue (cabbage, broccoli, etc) o Fruit -- Fresh (unpeeled skin & pulp), Dried (prunes, apricots, cherries, etc ),  or stewed ( applesauce)  o Whole grain breads, pasta, etc (whole wheat)  o Bran cereals    Bulking Agents -- This type of water-retaining fiber generally is easily obtained each day by one of the following:  o Psyllium bran -- The psyllium plant is remarkable because its ground seeds can retain so much water. This product is available as Metamucil, Konsyl, Effersyllium, Per Diem Fiber, or the less expensive generic preparation in drug and health  food stores. Although labeled a laxative, it really is not a laxative.  o Methylcellulose -- This is another fiber derived from wood which also retains water. It is available as Citrucel. o Polyethylene Glycol - and "artificial" fiber commonly called Miralax or Glycolax.  It is helpful for people with gassy or bloated feelings with regular fiber o Flax Seed - a less gassy fiber than psyllium   No reading or other relaxing activity while on the toilet. If bowel movements take longer than 5 minutes, you are too constipated   AVOID CONSTIPATION.  High fiber and water intake usually takes care of this.  Sometimes a laxative is needed to stimulate more frequent bowel movements, but    Laxatives are not a good long-term solution as it can wear the colon out. o Osmotics (Milk of Magnesia, Fleets phosphosoda, Magnesium citrate, MiraLax, GoLytely) are safer than  o Stimulants (Senokot, Castor Oil, Dulcolax, Ex Lax)    o Do not take laxatives for more than 7days in a row.    IF SEVERELY CONSTIPATED, try a Bowel Retraining Program: o Do not use laxatives.  o Eat a diet high in roughage, such as bran cereals and leafy vegetables.  o Drink six (6) ounces of prune or apricot juice each morning.  o Eat two (2) large servings of stewed fruit each day.  o Take one (1) heaping tablespoon of a psyllium-based bulking agent twice a day. Use sugar-free sweetener when possible to avoid  excessive calories.  o Eat a normal breakfast.  o Set aside 15 minutes after breakfast to sit on the toilet, but do not strain to have a bowel movement.  o If you do not have a bowel movement by the third day, use an enema and repeat the above steps.  o

## 2013-05-25 NOTE — Progress Notes (Signed)
Subjective:     Patient ID: Tracy [name], female   DOB: [birth year], 62 y.o.   MRN: [id number]  HPI 62 yo WF comes in for follow-up after undergoing exam under anesthesia, excision of large anterior prolapsed external hemorrhoid on Feb 21st. I last saw her about 6 weeks ago. She denies any fever, chills, nausea, vomiting, diarrhea or constipation. She reports daily bowel movements. She denies any pain with defecation. She denies any bleeding with the bowel movements. She denies any burning or itching. She has no complaints  Review of Systems     Objective:   Physical Exam BP 132/84  Pulse 76  Temp(Src) 98.4 F (36.9 C) (Oral)  Resp 16  Ht 5\' 2"  (1.575 m)  Wt 124 lb (56.246 kg)  BMI 22.67 kg/m2 Alert, nad, pleasant Rectal - well healed ant incision. No ext hemorrhoid. Decent tone. Anoscopy - no enlarged int hemorrhoids. Old scar from prior hemorrhoidectomy    Assessment:     Status post exam under anesthesia, excision of large anterior prolapse external hemorrhoid     Plan:     The area is well-healed. There is good tone. There is no residual significant internal hemorrhoid burden. We rediscussed good bowel habits such as drinking plenty of water and eating a high fiber diet. I told her she no longer had to take stool softeners or MiraLAX. Followup as needed  [name]. Andrey Campanile, MD, FACS General, Bariatric, & Minimally Invasive Surgery [facility], [place]

## 2013-07-20 ENCOUNTER — Ambulatory Visit: Payer: Self-pay | Admitting: Obstetrics & Gynecology

## 2013-08-19 ENCOUNTER — Other Ambulatory Visit: Payer: Self-pay | Admitting: Obstetrics & Gynecology

## 2013-08-22 NOTE — Telephone Encounter (Signed)
AEX scheduled for 08/31/13 #2/0rfs sent to pharmacy.

## 2013-08-31 ENCOUNTER — Ambulatory Visit: Payer: Self-pay | Admitting: Obstetrics & Gynecology

## 2013-09-12 ENCOUNTER — Encounter (INDEPENDENT_AMBULATORY_CARE_PROVIDER_SITE_OTHER): Payer: Self-pay

## 2013-09-18 ENCOUNTER — Other Ambulatory Visit: Payer: Self-pay | Admitting: Obstetrics & Gynecology

## 2013-09-18 NOTE — Telephone Encounter (Signed)
AEX 10/24/13 with you.  #2/0rf's was sent 08/19/13  Please advise.

## 2013-10-10 ENCOUNTER — Encounter: Payer: Self-pay | Admitting: Obstetrics & Gynecology

## 2013-10-24 ENCOUNTER — Ambulatory Visit: Payer: Self-pay | Admitting: Obstetrics & Gynecology

## 2013-11-20 ENCOUNTER — Ambulatory Visit: Payer: Self-pay | Admitting: Obstetrics & Gynecology

## 2013-12-26 ENCOUNTER — Telehealth: Payer: Self-pay | Admitting: Critical Care Medicine

## 2013-12-26 NOTE — Telephone Encounter (Signed)
LM to sched Rov per recall.  Tracy [name] 1st message was left on 12/23.  Tracy [name]

## 2013-12-28 NOTE — Telephone Encounter (Signed)
Appt w/ PW set for 01/22/14.  Nothing further needed.    Tracy [name]

## 2014-01-05 ENCOUNTER — Encounter: Payer: Self-pay | Admitting: Obstetrics & Gynecology

## 2014-01-08 ENCOUNTER — Ambulatory Visit: Payer: Self-pay | Admitting: Obstetrics & Gynecology

## 2014-01-19 ENCOUNTER — Other Ambulatory Visit: Payer: Self-pay | Admitting: Critical Care Medicine

## 2014-01-22 ENCOUNTER — Ambulatory Visit (INDEPENDENT_AMBULATORY_CARE_PROVIDER_SITE_OTHER): Payer: No Typology Code available for payment source | Admitting: Critical Care Medicine

## 2014-01-22 ENCOUNTER — Encounter: Payer: Self-pay | Admitting: Critical Care Medicine

## 2014-01-22 VITALS — BP 156/88 | HR 80 | Temp 98.1°F | Ht 62.0 in | Wt 124.2 lb

## 2014-01-22 DIAGNOSIS — J449 Chronic obstructive pulmonary disease, unspecified: Secondary | ICD-10-CM

## 2014-01-22 MED ORDER — PREDNISONE 10 MG PO TABS: ORAL_TABLET | ORAL | Status: DC

## 2014-01-22 MED ORDER — FLUTICASONE PROPIONATE 50 MCG/ACT NA SUSP: 2.0000 | Freq: Two times a day (BID) | NASAL | Status: DC

## 2014-01-22 MED ORDER — TIOTROPIUM BROMIDE MONOHYDRATE 18 MCG IN CAPS: ORAL_CAPSULE | RESPIRATORY_TRACT | Status: DC

## 2014-01-22 MED ORDER — AMOXICILLIN-POT CLAVULANATE 875-125 MG PO TABS: 1.0000 | ORAL_TABLET | Freq: Two times a day (BID) | ORAL | Status: DC

## 2014-01-22 MED ORDER — BUDESONIDE-FORMOTEROL FUMARATE 160-4.5 MCG/ACT IN AERO: 2.0000 | INHALATION_SPRAY | Freq: Two times a day (BID) | RESPIRATORY_TRACT | Status: DC

## 2014-01-22 MED ORDER — LORATADINE-PSEUDOEPHEDRINE ER 10-240 MG PO TB24
1.0000 | ORAL_TABLET | Freq: Every day | ORAL | Status: DC
Start: 2014-01-22 — End: 2015-04-10

## 2014-01-22 NOTE — Progress Notes (Signed)
Subjective:    Patient ID: Tracy [name], female    DOB: 12-25-1950, 63 y.o.   MRN: [id number]  HPI  63 y.o. ., white female, COPD with primary emphysematous component   01/22/2014 Chief Complaint  Patient presents with  . 8 month follow up    has PND, congestion, prod cough with yellowish mucus, wheezing, and increased DOE.  Symptoms started x 2 wks after being exposed to perfume.    Pt was around strong perfume, then developed more cough and thick yellow mucus.  Notes pndrip. Mucus out of the nose is clear, if goes down the throat will be green to yellow Notes facial pain.  Notes more dyspnea.  No chest pain. No fever.  No GERD symptoms    Review of Systems  Constitutional:   No  weight loss, night sweats,  + Fevers, chills, fatigue, or  lassitude.  HEENT:   No headaches,  Difficulty swallowing,  Tooth/dental problems, or  Sore throat,                No sneezing, itching, ear ache + nasal congestion, post nasal drip,   CV:  No chest pain,  Orthopnea, PND, swelling in lower extremities, anasarca, dizziness, palpitations, syncope.   GI  No heartburn, indigestion, abdominal pain, nausea, vomiting, diarrhea, change in bowel habits, loss of appetite, bloody stools.   Resp:   No coughing up of blood.     No chest wall deformity  Skin: no rash or lesions.  GU: no dysuria, change in color of urine, no urgency or frequency.  No flank pain, no hematuria   MS:  No joint pain or swelling.  No decreased range of motion.  No back pain.  Psych:  No change in mood or affect. No depression or anxiety.  No memory loss.     Objective:   Physical Exam BP 156/88  Pulse 80  Temp(Src) 98.1 F (36.7 C) (Oral)  Ht 5\' 2"  (1.575 m)  Wt 124 lb 3.2 oz (56.337 kg)  BMI 22.71 kg/m2  SpO2 96%  LMP 12/14/2000  GEN: A/Ox3; pleasant , NAD, well nourished   HEENT:  [place]/AT,  EACs-clear, TMs-wnl, NOSE-clear drianage, nontender sinus , THROAT-clear drainage ,  No max sinus tenderness , no lesions    NECK:  Supple w/ fair ROM; no JVD; normal carotid impulses w/o bruits; no thyromegaly or nodules palpated; no lymphadenopathy.  RESP  No wheeze or rhonchi, no accessory muscle use, no dullness to percussion, exp wheeze  CARD:  RRR, no m/r/g  , no peripheral edema, pulses intact, no cyanosis or clubbing.  GI:   Soft & nt; nml bowel sounds; no organomegaly or masses detected.  Musco: Warm bil, no deformities or joint swelling noted.   Neuro: alert, no focal deficits noted.    Skin: Warm, no lesions or rashes      Assessment & Plan:   Gold C Copd  Gold C copd with current exacerbation Plan Increase flonase to two puff ea nostril twice daily Prednisone 10mg  Take 4 for two days three for two days two for two days one for two days Augmentin one twice daily for 7days No change in inhalers Claritin 24 D daily Return 4 months , sooner if necessary     Updated Medication List Outpatient Encounter Prescriptions as of 01/22/2014  Medication Sig  . albuterol (PROVENTIL) (2.5 MG/3ML) 0.083% nebulizer solution Inhale one vial via nebulizer four times daily as needed dx code 496  . budesonide-formoterol (SYMBICORT) 160-4.5  MCG/ACT inhaler Inhale 2 puffs into the lungs 2 (two) times daily.  . fluticasone (FLONASE) 50 MCG/ACT nasal spray Place 2 sprays into both nostrils 2 (two) times daily.  Marland Kitchen loratadine-pseudoephedrine (CLARITIN-D 24-HOUR) 10-240 MG per 24 hr tablet Take 1 tablet by mouth daily as needed. For allergies  . tiotropium (SPIRIVA HANDIHALER) 18 MCG inhalation capsule INHALE ONE CAPSULE BY MOUTH ONE TIME DAILY  . Vitamin D, Ergocalciferol, (DRISDOL) 50000 UNITS CAPS capsule TAKE ONE CAPSULE BY MOUTH EVERY OTHER WEEK.  . [DISCONTINUED] budesonide-formoterol (SYMBICORT) 160-4.5 MCG/ACT inhaler Inhale 2 puffs into the lungs 2 (two) times daily.  . [DISCONTINUED] fluticasone (FLONASE) 50 MCG/ACT nasal spray Use two sprays in each nostril daily  . [DISCONTINUED] tiotropium (SPIRIVA  HANDIHALER) 18 MCG inhalation capsule INHALE ONE CAPSULE BY MOUTH ONE TIME DAILY  . amoxicillin-clavulanate (AUGMENTIN) 875-125 MG per tablet Take 1 tablet by mouth 2 (two) times daily.  Marland Kitchen loratadine-pseudoephedrine (CLARITIN-D 24 HOUR) 10-240 MG per 24 hr tablet Take 1 tablet by mouth daily.  . predniSONE (DELTASONE) 10 MG tablet Take 4 for two days three for two days two for two days one for two days

## 2014-01-22 NOTE — Patient Instructions (Signed)
Increase flonase to two puff ea nostril twice daily Prednisone 10mg  Take 4 for two days three for two days two for two days one for two days Augmentin one twice daily for 7days No change in inhalers Claritin 24 D daily Return 4 months , sooner if necessary

## 2014-01-23 NOTE — Assessment & Plan Note (Signed)
Gold C copd with current exacerbation Plan Increase flonase to two puff ea nostril twice daily Prednisone 10mg  Take 4 for two days three for two days two for two days one for two days Augmentin one twice daily for 7days No change in inhalers Claritin 24 D daily Return 4 months , sooner if necessary

## 2014-02-07 ENCOUNTER — Encounter: Payer: Self-pay | Admitting: Obstetrics & Gynecology

## 2014-02-09 ENCOUNTER — Ambulatory Visit: Payer: Self-pay | Admitting: Obstetrics & Gynecology

## 2014-03-05 ENCOUNTER — Ambulatory Visit (INDEPENDENT_AMBULATORY_CARE_PROVIDER_SITE_OTHER): Payer: No Typology Code available for payment source | Admitting: Obstetrics & Gynecology

## 2014-03-05 ENCOUNTER — Encounter: Payer: Self-pay | Admitting: Obstetrics & Gynecology

## 2014-03-05 VITALS — BP 152/84 | HR 68 | Resp 16 | Ht 62.75 in | Wt 121.8 lb

## 2014-03-05 DIAGNOSIS — Z01419 Encounter for gynecological examination (general) (routine) without abnormal findings: Secondary | ICD-10-CM

## 2014-03-05 DIAGNOSIS — Z1211 Encounter for screening for malignant neoplasm of colon: Secondary | ICD-10-CM

## 2014-03-05 DIAGNOSIS — Z0000 Encounter for general adult medical examination without abnormal findings: Secondary | ICD-10-CM

## 2014-03-05 LAB — POCT URINALYSIS DIPSTICK
BILIRUBIN UA: NEGATIVE
GLUCOSE UA: NEGATIVE
Ketones, UA: NEGATIVE
NITRITE UA: NEGATIVE
Protein, UA: NEGATIVE
UROBILINOGEN UA: NEGATIVE
pH, UA: 5

## 2014-03-05 MED ORDER — VITAMIN D (ERGOCALCIFEROL) 1.25 MG (50000 UNIT) PO CAPS: ORAL_CAPSULE | ORAL | Status: DC

## 2014-03-05 MED ORDER — PHENTERMINE HCL 15 MG PO CAPS: 15.0000 mg | ORAL_CAPSULE | ORAL | Status: DC

## 2014-03-05 NOTE — Progress Notes (Signed)
63 y.o. G2P2 MarriedCaucasianF here for annual exam.  Had GI issues over the weekend.  No vaginal bleeding.  Doing really well.  Had hemorrhoid surgery last year and again this year.   Very frustrated with weight.  Gained 8-10 pounds due to prednisone.  Used phentermine in 2008.  Would like to restart for a few months.    Patient's last menstrual period was 12/14/2000.          Sexually active: yes  The current method of family planning is post menopausal status.    Exercising: yes  a little Smoker:  Former smoker-15 years ago  Health Maintenance: Pap:  07/18/12 WNL/negative HR HPV History of abnormal Pap:  no MMG:  10/30/13 3D-normal Colonoscopy:  none BMD:   2008 -2.2/-1.6 TDaP:  05/15/13 Screening Labs: 8/13, Hb today: not done, Urine today: WBC-trace, RBC-trace   reports that she has quit smoking. Her smoking use included Cigarettes. She has a 10 pack-year smoking history. She has never used smokeless tobacco. She reports that she drinks alcohol. She reports that she does not use illicit drugs.  Past Medical History  Diagnosis Date  . Asthma   . COPD (chronic obstructive pulmonary disease) 2011    FeV1 31% predicted FeV1/FVX 47 %  . Emphysema of lung   . Seasonal allergies     takes Claritin daily prn  . Hemorrhoids   . Vitamin D deficiency     takes Vit d every 14 days  . Osteopenia   . Hemorrhoid     Past Surgical History  Procedure Laterality Date  . Tonsillectomy  age 36     recurrent otitis media  . Foot surgery      left bunionectomy  . Examination under anesthesia  10/14/2012    Procedure: EXAM UNDER ANESTHESIA;  Surgeon: Gayland Curry, MD,FACS;  Location: [facility];  Service: General;  Laterality: N/A;  rectal exam under anesthesia excisional hemorrhoidectomy hemorrhoidal banding x two  . Hemorrhoidectomy with hemorrhoid banding  10/14/2012    Procedure: HEMORRHOIDECTOMY WITH HEMORRHOID BANDING;  Surgeon: Gayland Curry, MD,FACS;  Location: [facility];  Service: General;   Laterality: N/A;  . Examination under anesthesia  02/03/2013    excision hemorrhoidal tissue  . Breast enhancement surgery    . Augmentation mammaplasty      saline    Current Outpatient Prescriptions  Medication Sig Dispense Refill  . albuterol (PROVENTIL) (2.5 MG/3ML) 0.083% nebulizer solution Inhale one vial via nebulizer four times daily as needed dx code 496  150 mL  4  . budesonide-formoterol (SYMBICORT) 160-4.5 MCG/ACT inhaler Inhale 2 puffs into the lungs 2 (two) times daily.  1 Inhaler  12  . fluticasone (FLONASE) 50 MCG/ACT nasal spray Place 2 sprays into both nostrils 2 (two) times daily.  16 g  11  . loratadine-pseudoephedrine (CLARITIN-D 24 HOUR) 10-240 MG per 24 hr tablet Take 1 tablet by mouth daily.  30 tablet  6  . tiotropium (SPIRIVA HANDIHALER) 18 MCG inhalation capsule INHALE ONE CAPSULE BY MOUTH ONE TIME DAILY  30 capsule  6  . Vitamin D, Ergocalciferol, (DRISDOL) 50000 UNITS CAPS capsule TAKE ONE CAPSULE BY MOUTH EVERY OTHER WEEK.  6 capsule  4   No current facility-administered medications for this visit.    Family History  Problem Relation Age of Onset  . Other Father     [place] in snow, broke ankle, blood clot to lungs  . Heart murmur Sister   . Other Brother  h/o being hit by lightening  . Parkinsonism Brother   . Asthma Son   . Allergies Son   . Otitis media Son   . Other Sister     CML  . Asthma Son   . Allergies Son   . Osteoporosis Mother   . Pancreatic cancer Brother     diag 12-13-13-deceased 02-12-14    ROS:  Pertinent items are noted in HPI.  Otherwise, a comprehensive ROS was negative.  Exam:   BP 152/84  Pulse 68  Resp 16  Ht 5' 2.75" (1.594 m)  Wt 121 lb 12.8 oz (55.248 kg)  BMI 21.74 kg/m2  LMP 12/14/2000  Weight change: +3lbs   Height: 5' 2.75" (159.4 cm)  Ht Readings from Last 3 Encounters:  03/05/14 5' 2.75" (1.594 m)  01/22/14 5\' 2"  (1.575 m)  05/25/13 5\' 2"  (1.575 m)    General appearance: alert, cooperative and appears  stated age Head: Normocephalic, without obvious abnormality, atraumatic Neck: no adenopathy, supple, symmetrical, trachea midline and thyroid normal to inspection and palpation Lungs: clear to auscultation bilaterally Breasts: normal appearance, no masses or tenderness, bilateral  Heart: regular rate and rhythm Abdomen: soft, non-tender; bowel sounds normal; no masses,  no organomegaly Extremities: extremities normal, atraumatic, no cyanosis or edema Skin: Skin color, texture, turgor normal. No rashes or lesions Lymph nodes: Cervical, supraclavicular, and axillary nodes normal. No abnormal inguinal nodes palpated Neurologic: Grossly normal   Pelvic: External genitalia:  no lesions              Urethra:  normal appearing urethra with no masses, tenderness or lesions              Bartholins and Skenes: normal                 Vagina: normal appearing vagina with normal color and discharge, no lesions              Cervix: no lesions              Pap taken: no Bimanual Exam:  Uterus:  normal size, contour, position, consistency, mobility, non-tender              Adnexa: normal adnexa and no mass, fullness, tenderness               Rectovaginal: Confirms               Anus:  normal sphincter tone, no lesions  A:  Well Woman with normal exam PMP, no HRT Asthma/COPD/seasonal allergies H/O significant hemorrhoids Desires some weight loss  P:   Mammogram yearly. pap smear with neg HR HPV 8/13 Will do colonoscopy this yr.  Referral to Dr. Collene Mares. Phentermine 15mg .  Start in a week to make sure GI issues are resolved.  Recheck 6 weeks.  Pt knows risks include elevated BP, headache, insomnia, pulmonary hypertension and she will call if has any issues. return annually or prn  An After Visit Summary was printed and given to the patient.

## 2014-03-06 ENCOUNTER — Telehealth: Payer: Self-pay | Admitting: Obstetrics & Gynecology

## 2014-03-06 NOTE — Telephone Encounter (Signed)
Message copied by Laqueta Due on Tue Mar 06, 2014  8:45 AM ------      Message from: [name]      Created: Mon Mar 05, 2014  6:12 PM       Can you let her know this information.  So, if she meets her deductible in any year, she should do a bone density.  Thanks.            MSM      ----- Message -----         From: Laqueta Due         Sent: 03/05/2014   4:13 PM           To: Lyman Speller, MD            Dr Sabra Heck,            According to the benefits quote received, this service will be covered under her plan with a referral from her pcp. However, it will be subject to her $5000 calendar year deductible that has not been met yet.            Thank you,      Gabriel Cirri      ----- Message -----         From: Lyman Speller, MD         Sent: 03/05/2014   2:10 PM           To: Laqueta Due            Can you see if this pt's insurance pays for bone density if she has osteopenia.             ------

## 2014-03-06 NOTE — Addendum Note (Signed)
Addended by: Blanchard Kelch R on: 03/06/2014 10:43 AM   Modules accepted: Orders

## 2014-03-06 NOTE — Telephone Encounter (Signed)
Left message for patient to call back. Need to review bone density benefits.

## 2014-03-10 ENCOUNTER — Other Ambulatory Visit: Payer: Self-pay | Admitting: Critical Care Medicine

## 2014-03-15 ENCOUNTER — Telehealth: Payer: Self-pay | Admitting: Obstetrics & Gynecology

## 2014-03-15 NOTE — Telephone Encounter (Signed)
Patient is scheduled with Dr Collene Mares 04.23.2015 @1330 . Patient is aware

## 2014-04-26 ENCOUNTER — Ambulatory Visit: Payer: No Typology Code available for payment source | Admitting: Obstetrics & Gynecology

## 2014-05-11 ENCOUNTER — Ambulatory Visit: Payer: No Typology Code available for payment source | Admitting: Obstetrics & Gynecology

## 2014-05-24 ENCOUNTER — Ambulatory Visit (INDEPENDENT_AMBULATORY_CARE_PROVIDER_SITE_OTHER): Payer: No Typology Code available for payment source | Admitting: Obstetrics & Gynecology

## 2014-05-24 VITALS — BP 152/96 | HR 80 | Resp 12 | Ht 62.75 in | Wt 119.4 lb

## 2014-05-24 DIAGNOSIS — J45909 Unspecified asthma, uncomplicated: Secondary | ICD-10-CM

## 2014-05-24 DIAGNOSIS — R030 Elevated blood-pressure reading, without diagnosis of hypertension: Secondary | ICD-10-CM

## 2014-05-25 ENCOUNTER — Encounter: Payer: Self-pay | Admitting: Obstetrics & Gynecology

## 2014-05-25 DIAGNOSIS — J45909 Unspecified asthma, uncomplicated: Secondary | ICD-10-CM | POA: Insufficient documentation

## 2014-05-25 NOTE — Progress Notes (Signed)
Patient ID: Tracy [name], female   DOB: 1951/09/03, 63 y.o.   MRN: [id number]  63 y.o. Married Caucasian female G2P2 here for follow up after starting phentermine.  BP is elevated today.  When I review her last visit, she BP was mildly elevated that day as well.  Advised she needs to not take any more of these medications.  Pt reports she hasn't been on it since Monday due to family and work issues.  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  One was forced to leave and the other resigned.  She has just returned from [place].  She was there for the burial of her brother at [facility].  Very emotional for her.  Also, the weather is making her asthma/COPD worse.  She is not feeling compromised at all but she did use her nebulizer just before coming for a treatment.  Also, she is taking clairitin D daily.  Advised to stop the D component.  She is going to get a BP cuff and start checking her blood pressures.  She may end up needing treatment for hypertension.  She is also going to stop the phentermine.  Advised she should NOT finish what is in the bottle.  Voices clear understanding.     O: Healthy WD,WN female Affect: normal  A Desired weight loss Elevated BPs, may be medication related vs essential hypertension  P: Stop phentermine.  Stop the decongestant in her claritin.  Pt will start checking BPs.  Recommendations given for going to PCP or urgent care if has DBP>100 or SBP >200.    Will get her to fax her BPs in three to four days to see if treatment needed.  ~15 minutes spent with patient >50% of time was in face to face discussion of above.

## 2014-05-28 ENCOUNTER — Telehealth: Payer: Self-pay

## 2014-05-28 NOTE — Telephone Encounter (Signed)
Message copied by Robley Fries on Mon May 28, 2014  3:43 PM ------      Message from: [name]      Created: Mon May 28, 2014 12:11 PM      Regarding: check on pt please       Tracy [name]      Can you check on pt and see how her BPs have been?  Thanks.            MSM ------

## 2014-05-28 NOTE — Telephone Encounter (Signed)
lmtcb-checking on her BP//kn

## 2014-06-05 NOTE — Telephone Encounter (Signed)
Left detailed message re: blood pressure//kn

## 2014-06-19 NOTE — Telephone Encounter (Signed)
Letter sent to patient re: BP//kn

## 2014-06-28 ENCOUNTER — Ambulatory Visit: Payer: No Typology Code available for payment source | Admitting: Critical Care Medicine

## 2014-07-12 ENCOUNTER — Telehealth: Payer: Self-pay | Admitting: *Deleted

## 2014-07-12 ENCOUNTER — Telehealth: Payer: Self-pay | Admitting: Obstetrics & Gynecology

## 2014-07-12 ENCOUNTER — Ambulatory Visit (INDEPENDENT_AMBULATORY_CARE_PROVIDER_SITE_OTHER): Payer: No Typology Code available for payment source | Admitting: Critical Care Medicine

## 2014-07-12 ENCOUNTER — Encounter: Payer: Self-pay | Admitting: Critical Care Medicine

## 2014-07-12 VITALS — BP 118/66 | HR 69 | Ht 62.0 in | Wt 123.0 lb

## 2014-07-12 DIAGNOSIS — Z23 Encounter for immunization: Secondary | ICD-10-CM

## 2014-07-12 DIAGNOSIS — J449 Chronic obstructive pulmonary disease, unspecified: Secondary | ICD-10-CM

## 2014-07-12 MED ORDER — TIOTROPIUM BROMIDE-OLODATEROL 2.5-2.5 MCG/ACT IN AERS: 2.0000 | INHALATION_SPRAY | Freq: Every day | RESPIRATORY_TRACT | Status: DC

## 2014-07-12 NOTE — Progress Notes (Signed)
Subjective:    Patient ID: Tracy [name], female    DOB: 1951-02-19, 63 y.o.   MRN: [id number]  HPI 07/12/2014 Chief Complaint  Patient presents with  . Follow-up    Pt c/o chest congestion, cough this morning but states that this is not usual for her.  Normally has no breathing complaints.  CAT score 10.  Mucus is better. No recent abx use.  Pt is planning retirement soon.   Pt denies any significant sore throat, nasal congestion or excess secretions, fever, chills, sweats, unintended weight loss, pleurtic or exertional chest pain, orthopnea PND, or leg swelling Pt denies any increase in rescue therapy over baseline, denies waking up needing it or having any early am or nocturnal exacerbations of coughing/wheezing/or dyspnea. Pt also denies any obvious fluctuation in symptoms with  weather or environmental change or other alleviating or aggravating factors     Review of Systems Constitutional:   No  weight loss, night sweats,  Fevers, chills, fatigue, lassitude. HEENT:   No headaches,  Difficulty swallowing,  Tooth/dental problems,  Sore throat,                No sneezing, itching, ear ache, nasal congestion, post nasal drip,   CV:  No chest pain,  Orthopnea, PND, swelling in lower extremities, anasarca, dizziness, palpitations  GI  No heartburn, indigestion, abdominal pain, nausea, vomiting, diarrhea, change in bowel habits, loss of appetite  Resp: Notes shortness of breath with exertion not  at rest.  No excess mucus, no productive cough,  No non-productive cough,  No coughing up of blood.  No change in color of mucus.  No wheezing.  No chest wall deformity  Skin: no rash or lesions.  GU: no dysuria, change in color of urine, no urgency or frequency.  No flank pain.  MS:  No joint pain or swelling.  No decreased range of motion.  No back pain.  Psych:  No change in mood or affect. No depression or anxiety.  No memory loss.     Objective:   Physical Exam Filed Vitals:   07/12/14 1059  BP: 118/66  Pulse: 69  Height: 5\' 2"  (1.575 m)  Weight: 123 lb (55.792 kg)  SpO2: 95%    Gen: Pleasant, well-nourished, in no distress,  normal affect  ENT: No lesions,  mouth clear,  oropharynx clear, no postnasal drip  Neck: No JVD, no TMG, no carotid bruits  Lungs: No use of accessory muscles, no dullness to percussion, distant breath sounds   Cardiovascular: RRR, heart sounds normal, no murmur or gallops, no peripheral edema  Abdomen: soft and NT, no HSM,  BS normal  Musculoskeletal: No deformities, no cyanosis or clubbing  Neuro: alert, non focal  Skin: Warm, no lesions or rashes  No results found.     Assessment & Plan:   Gold C Copd  Chronic obstructive lung disease gold stage C.   Attempt now to consolidate therapy by using combination treatment versus 2 separate inhalers Plan Stop Spiriva and Symbicort Start Stiolto two puff daily We have samples at [facility] office you can pick up.  The Stiolto is tier 1 on your drug plan Prevnar 13 was given today Return 2 months    Updated Medication List Outpatient Encounter Prescriptions as of 07/12/2014  Medication Sig  . albuterol (PROVENTIL) (2.5 MG/3ML) 0.083% nebulizer solution Inhale one vial via nebulizer four times daily as needed  . fluticasone (FLONASE) 50 MCG/ACT nasal spray Place 2 sprays into both nostrils  2 (two) times daily as needed.  . loratadine-pseudoephedrine (CLARITIN-D 24 HOUR) 10-240 MG per 24 hr tablet Take 1 tablet by mouth daily.  . Vitamin D, Ergocalciferol, (DRISDOL) 50000 UNITS CAPS capsule TAKE ONE CAPSULE BY MOUTH EVERY OTHER WEEK.  . [DISCONTINUED] budesonide-formoterol (SYMBICORT) 160-4.5 MCG/ACT inhaler Inhale 2 puffs into the lungs 2 (two) times daily.  . [DISCONTINUED] fluticasone (FLONASE) 50 MCG/ACT nasal spray Place 2 sprays into both nostrils 2 (two) times daily.  . [DISCONTINUED] tiotropium (SPIRIVA HANDIHALER) 18 MCG inhalation capsule INHALE ONE CAPSULE BY  MOUTH ONE TIME DAILY  . Tiotropium Bromide-Olodaterol (STIOLTO RESPIMAT) 2.5-2.5 MCG/ACT AERS Inhale 2 puffs into the lungs daily.  . Tiotropium Bromide-Olodaterol (STIOLTO RESPIMAT) 2.5-2.5 MCG/ACT AERS Inhale 2 puffs into the lungs daily.

## 2014-07-12 NOTE — Telephone Encounter (Signed)
Phone call received from  [facility] needing some samples placed up front for patient Pt has been advised instruction of dosing Samples documented and placed up front.  Will need to be shown how to use when picking up samples Nothing further needed.

## 2014-07-12 NOTE — Telephone Encounter (Signed)
Patient says she is returning a call to [place]. No telephone note.

## 2014-07-12 NOTE — Telephone Encounter (Signed)
Spoke with patient. Patient is calling in regards to letter sent about blood pressure from [place]. Patient states that she has been checking her blood pressure and it has been 118-120/70. Went to [place] today for asthma and bp was 116/66. Patient states that her doctor said it could have been due to stress and all the medications she was taking at one time. Patient has stopped taking Claritin D since. Would like to restart on the phentermine at this time. Advised would send a message over to [place] regarding this request and give patient a call back with further recommendations and instructions. Patient agreeable.

## 2014-07-12 NOTE — Patient Instructions (Addendum)
Stop Spiriva and Symbicort Start Stiolto two puff daily We have samples at [facility] office you can pick up.  The Stiolto is tier 1 on your drug plan Prevnar 13 was given today Return 2 months

## 2014-07-13 NOTE — Assessment & Plan Note (Signed)
Chronic obstructive lung disease gold stage C.   Attempt now to consolidate therapy by using combination treatment versus 2 separate inhalers Plan Stop Spiriva and Symbicort Start Stiolto two puff daily We have samples at [facility] office you can pick up.  The Stiolto is tier 1 on your drug plan Prevnar 13 was given today Return 2 months

## 2014-07-20 NOTE — Telephone Encounter (Addendum)
Pt came to the office. Performed inhaler teaching on the [place] 2puffs once daily given at the 7.30.15 ov w/ PW. Pt voiced her understanding and denied any questions/concerns at this time. Nothing further needed; will sign off.

## 2014-07-27 MED ORDER — PHENTERMINE HCL 15 MG PO CAPS: 15.0000 mg | ORAL_CAPSULE | ORAL | Status: DC

## 2014-07-30 NOTE — Telephone Encounter (Signed)
Patient calling to check on status of call. Advised would send a message over to [place] for review and advise. Patient states she is willing to come in for a blood pressure check if she needs to. Advised would speak with Dr.Miller and give her a call back today to let her know. Patient agreeable.

## 2014-07-31 NOTE — Telephone Encounter (Signed)
This rx was okayed last week.  [facility] will reprint it and fax it today. Please apologize to pt.  Pt needs to start checking daily BP and keep them written down and will need an appt with me for recheck 2-3 weeks after starting medication.  CC:  Maryann Conners

## 2014-07-31 NOTE — Telephone Encounter (Signed)
Spoke with patient. Advised of message as seen below from [place]. Patient agreeable would like to know where rx was sent. Advised would check with kelly and give her a call back. Recheck scheduled for 9/15 at 2:30pm with Dr.Miller. Patient agreeable to date and time.

## 2014-07-31 NOTE — Telephone Encounter (Signed)
Left detailed message at number provided [phone]. Okay per ROI. Advised patient that rx was sent to [facility] pharmacy in [place] and that we called to verify and it is ready for pick up. Advised to call back if any further questions.  Routing to provider for final review. Patient agreeable to disposition. Will close encounter

## 2014-08-28 ENCOUNTER — Ambulatory Visit (INDEPENDENT_AMBULATORY_CARE_PROVIDER_SITE_OTHER): Payer: No Typology Code available for payment source | Admitting: Obstetrics & Gynecology

## 2014-08-28 VITALS — BP 144/98 | HR 72 | Resp 16 | Ht 62.75 in | Wt 122.4 lb

## 2014-08-28 DIAGNOSIS — R030 Elevated blood-pressure reading, without diagnosis of hypertension: Secondary | ICD-10-CM

## 2014-08-28 DIAGNOSIS — R634 Abnormal weight loss: Secondary | ICD-10-CM

## 2014-08-28 DIAGNOSIS — IMO0001 Reserved for inherently not codable concepts without codable children: Secondary | ICD-10-CM

## 2014-08-28 NOTE — Progress Notes (Signed)
Patient ID: Tracy [name], female   DOB: 09-30-1951, 63 y.o.   MRN: [id number]  63 y.o. Married Caucasian female G2P2 here for follow up after restarting phentermine. Pt last seen 05/24/14 after starting phentermine and BP was 152/96.  At that time, had just returned from [place] due to burial of brother at [place].  Pt reports BPs at home have been "good".  Didn't bring a list of BPs with her.  Has been taking with a cuff she has at home.  Admits she hasn't checked them a lot as she is moving to [place] to downsize.  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  Will be quiting job in two weeks.  As this project will be finished next week, no one else has been told.  Pt feeling a lot of stress related to this.    Denies headache, jitteriness, visual changes.  BP is elevated again today at 144/98.  Aware she has to stop the medication.  She states she will.  She still would like to try it but before we do, I will want to see a normal blood pressure and when she returns for follow up, she will need to bring her BP cuff with her so we can calibrate with our office value.  Doubtful she will be able to use this medication as it obviously contributes to elevated BPs.  My only question is still whether she has underlying essential hypertension.    O: Healthy WD,WN female  Affect: normal   A Desired weight loss  Elevated BPs, probably medication related  P: Stop phentermine. Pt will watch BPs to make sure normalize.  Knows to all if this doesn't happen.  Once settled in [place], she may call to restart medication.  I will want to check BP with her cuff, as well, to make sure getting valid results.  Pt understands.    Wished well with move and end of job, life changes.   ~15 minutes spent with patient >50% of time was in face to face discussion of above.

## 2014-08-30 ENCOUNTER — Ambulatory Visit: Payer: No Typology Code available for payment source | Admitting: Critical Care Medicine

## 2014-09-02 ENCOUNTER — Encounter: Payer: Self-pay | Admitting: Obstetrics & Gynecology

## 2014-09-13 ENCOUNTER — Ambulatory Visit (INDEPENDENT_AMBULATORY_CARE_PROVIDER_SITE_OTHER): Payer: No Typology Code available for payment source | Admitting: Critical Care Medicine

## 2014-09-13 ENCOUNTER — Encounter: Payer: Self-pay | Admitting: Critical Care Medicine

## 2014-09-13 VITALS — BP 136/78 | HR 65 | Ht 62.0 in | Wt 120.0 lb

## 2014-09-13 DIAGNOSIS — Z23 Encounter for immunization: Secondary | ICD-10-CM

## 2014-09-13 DIAGNOSIS — J449 Chronic obstructive pulmonary disease, unspecified: Secondary | ICD-10-CM

## 2014-09-13 MED ORDER — TIOTROPIUM BROMIDE-OLODATEROL 2.5-2.5 MCG/ACT IN AERS: 2.0000 | INHALATION_SPRAY | Freq: Every day | RESPIRATORY_TRACT | Status: DC

## 2014-09-13 NOTE — Patient Instructions (Signed)
Flu vaccine was given REturn 4 months No change in medications

## 2014-09-13 NOTE — Progress Notes (Signed)
Subjective:    Patient ID: Tracy [name], female    DOB: 1951-09-23, 63 y.o.   MRN: [id number]  HPI   09/13/2014 Chief Complaint  Patient presents with  . Follow-up    Pt's breathing has been stable, no complaints today.  Pt states the stiolto works well for her.    No difficulty with dyspnea.  No real cough.  No wheezing . Pt denies any significant sore throat, nasal congestion or excess secretions, fever, chills, sweats, unintended weight loss, pleurtic or exertional chest pain, orthopnea PND, or leg swelling Pt denies any increase in rescue therapy over baseline, denies waking up needing it or having any early am or nocturnal exacerbations of coughing/wheezing/or dyspnea. Pt also denies any obvious fluctuation in symptoms with  weather or environmental change or other alleviating or aggravating factors    Review of Systems  Constitutional:   No  weight loss, night sweats,  Fevers, chills, fatigue, lassitude. HEENT:   No headaches,  Difficulty swallowing,  Tooth/dental problems,  Sore throat,                No sneezing, itching, ear ache, nasal congestion, post nasal drip,   CV:  No chest pain,  Orthopnea, PND, swelling in lower extremities, anasarca, dizziness, palpitations  GI  No heartburn, indigestion, abdominal pain, nausea, vomiting, diarrhea, change in bowel habits, loss of appetite  Resp: Notes shortness of breath with exertion not  at rest.  No excess mucus, no productive cough,  No non-productive cough,  No coughing up of blood.  No change in color of mucus.  No wheezing.  No chest wall deformity  Skin: no rash or lesions.  GU: no dysuria, change in color of urine, no urgency or frequency.  No flank pain.  MS:  No joint pain or swelling.  No decreased range of motion.  No back pain.  Psych:  No change in mood or affect. No depression or anxiety.  No memory loss.     Objective:   Physical Exam  Filed Vitals:   09/13/14 1022  BP: 136/78  Pulse: 65  Height:  5\' 2"  (1.575 m)  Weight: 120 lb (54.432 kg)  SpO2: 96%    Gen: Pleasant, well-nourished, in no distress,  normal affect  ENT: No lesions,  mouth clear,  oropharynx clear, no postnasal drip  Neck: No JVD, no TMG, no carotid bruits  Lungs: No use of accessory muscles, no dullness to percussion, distant breath sounds   Cardiovascular: RRR, heart sounds normal, no murmur or gallops, no peripheral edema  Abdomen: soft and NT, no HSM,  BS normal  Musculoskeletal: No deformities, no cyanosis or clubbing  Neuro: alert, non focal  Skin: Warm, no lesions or rashes  No results found.     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 albuterol (PROVENTIL) (2.5 MG/3ML) 0.083% nebulizer solution Inhale one vial via nebulizer four times daily as needed  . fluticasone (FLONASE) 50 MCG/ACT nasal spray Place 2 sprays into both nostrils 2 (two) times daily as needed.  . loratadine-pseudoephedrine (CLARITIN-D 24 HOUR) 10-240 MG per 24 hr tablet Take 1 tablet by mouth daily.  . phentermine 15 MG capsule Take 1 capsule (15 mg total) by mouth every morning.  . Tiotropium Bromide-Olodaterol (  STIOLTO RESPIMAT) 2.5-2.5 MCG/ACT AERS Inhale 2 puffs into the lungs daily.  . Vitamin D, Ergocalciferol, (DRISDOL) 50000 UNITS CAPS capsule TAKE ONE CAPSULE BY MOUTH EVERY OTHER WEEK.  . [DISCONTINUED] Tiotropium Bromide-Olodaterol (STIOLTO RESPIMAT) 2.5-2.5 MCG/ACT AERS Inhale 2 puffs into the lungs daily.  . [DISCONTINUED] Tiotropium Bromide-Olodaterol (STIOLTO RESPIMAT) 2.5-2.5 MCG/ACT AERS Inhale 2 puffs into the lungs daily.  . [DISCONTINUED] Tiotropium Bromide-Olodaterol (STIOLTO RESPIMAT) 2.5-2.5 MCG/ACT AERS Inhale 2 puffs into the lungs  daily.

## 2014-09-13 NOTE — Assessment & Plan Note (Signed)
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

## 2014-10-15 ENCOUNTER — Encounter: Payer: Self-pay | Admitting: Critical Care Medicine

## 2015-04-09 ENCOUNTER — Telehealth: Payer: Self-pay | Admitting: Critical Care Medicine

## 2015-04-09 MED ORDER — TIOTROPIUM BROMIDE-OLODATEROL 2.5-2.5 MCG/ACT IN AERS: 2.0000 | INHALATION_SPRAY | Freq: Every day | RESPIRATORY_TRACT | Status: DC

## 2015-04-09 MED ORDER — FLUTICASONE PROPIONATE 50 MCG/ACT NA SUSP: 2.0000 | Freq: Two times a day (BID) | NASAL | Status: DC | PRN

## 2015-04-09 MED ORDER — ALBUTEROL SULFATE (2.5 MG/3ML) 0.083% IN NEBU: 2.5000 mg | INHALATION_SOLUTION | RESPIRATORY_TRACT | Status: DC | PRN

## 2015-04-09 NOTE — Telephone Encounter (Signed)
[facility] [place]  Patient needs OV for additional refills.  Attempted to contact patient, lmtcb.

## 2015-04-10 MED ORDER — LORATADINE-PSEUDOEPHEDRINE ER 10-240 MG PO TB24: 1.0000 | ORAL_TABLET | Freq: Every day | ORAL | Status: DC

## 2015-04-10 NOTE — Telephone Encounter (Signed)
lmtcb x1 or pt.

## 2015-04-10 NOTE — Telephone Encounter (Signed)
Spoke with pt. Advised her that her prescription has been sent in and that she needs ROV. ROV has been scheduled for 06/12/15 (per pt's request) at 11:45am. Nothing further was needed.

## 2015-04-10 NOTE — Telephone Encounter (Signed)
Pt returning call.Tracy [name] ° °

## 2015-06-11 ENCOUNTER — Other Ambulatory Visit: Payer: Self-pay | Admitting: Critical Care Medicine

## 2015-06-12 ENCOUNTER — Ambulatory Visit: Payer: No Typology Code available for payment source | Admitting: Critical Care Medicine

## 2015-06-26 ENCOUNTER — Ambulatory Visit: Payer: No Typology Code available for payment source | Admitting: Critical Care Medicine

## 2015-07-12 ENCOUNTER — Telehealth: Payer: Self-pay | Admitting: Critical Care Medicine

## 2015-07-12 NOTE — Telephone Encounter (Signed)
Patient is currently living in [name] area), patient said that Dr. Joya Gaskins told her that he could refer her to a provider in that area.  She is calling to get that referral.  Dr. Joya Gaskins, please advise.

## 2015-07-12 NOTE — Telephone Encounter (Signed)
lmtcb X1 for pt  

## 2015-07-12 NOTE — Telephone Encounter (Signed)
I would ask her to see about getting into Dr Pricilla Holm practice, they  Are very good

## 2015-07-12 NOTE — Telephone Encounter (Signed)
Spoke with pt, gave name, address, and telephone number of Dr. Court Joy to pt.  She is aware of this recommendation.  Pt wishes to cancel her appt here in September since she will be following up with that clinic.  That appt has been cancelled.  Nothing further needed.

## 2015-07-29 ENCOUNTER — Ambulatory Visit: Payer: No Typology Code available for payment source | Admitting: Critical Care Medicine

## 2015-08-19 ENCOUNTER — Other Ambulatory Visit: Payer: Self-pay | Admitting: Critical Care Medicine

## 2015-08-28 ENCOUNTER — Ambulatory Visit: Payer: No Typology Code available for payment source | Admitting: Critical Care Medicine

## 2015-10-21 ENCOUNTER — Other Ambulatory Visit: Payer: Self-pay

## 2015-10-22 ENCOUNTER — Other Ambulatory Visit: Payer: Self-pay | Admitting: *Deleted

## 2015-10-22 MED ORDER — LORATADINE-PSEUDOEPHEDRINE ER 10-240 MG PO TB24: 1.0000 | ORAL_TABLET | Freq: Every day | ORAL | Status: DC

## 2015-10-22 MED ORDER — ALBUTEROL SULFATE (2.5 MG/3ML) 0.083% IN NEBU: INHALATION_SOLUTION | RESPIRATORY_TRACT | Status: DC

## 2015-10-22 NOTE — Telephone Encounter (Signed)
[facility] [place] [facility]ol neb and Claritin D.  Pt last seen by Dr. Joya Gaskins on 09/13/2014.  Was asked to f/u in 4 months.  In July 2016,  Phone msg in chart stating pt has moved to [place].  We rec she see Dr. Court Joy.    Spoke with pt.  States she is living in [place] but has not made an appt with Dr. Dorthula Matas office yet.  States she will call to schedule appt.  Advised will send rxs to hold her over for the next 2 months to get an appt with his office, but if unable to do so, will need to schedule f/u with our office for additional rxs given it has been > 1 yr since seen.  Pt verbalized understanding and is in agreement with plan.  Rxs sent - pt aware and voiced no further questions or concerns at this time.  Note:  rxs sent under TP as PW is no longer seeing office pts with [facility].  TP aware of above.

## 2015-12-02 ENCOUNTER — Telehealth: Payer: Self-pay | Admitting: Critical Care Medicine

## 2015-12-02 MED ORDER — LORATADINE-PSEUDOEPHEDRINE ER 10-240 MG PO TB24: 1.0000 | ORAL_TABLET | Freq: Every day | ORAL | Status: DC

## 2015-12-02 NOTE — Telephone Encounter (Signed)
Called and spoke to pt. Pt is requesting a Claritin D refill to last till appt with SN in January. Rx sent to preferred pharmacy. Pt verbalized understanding and denied any further questions or concerns at this time.

## 2016-01-01 ENCOUNTER — Institutional Professional Consult (permissible substitution): Payer: No Typology Code available for payment source | Admitting: Pulmonary Disease

## 2016-01-21 ENCOUNTER — Ambulatory Visit (INDEPENDENT_AMBULATORY_CARE_PROVIDER_SITE_OTHER): Payer: No Typology Code available for payment source | Admitting: Pulmonary Disease

## 2016-01-21 ENCOUNTER — Encounter: Payer: Self-pay | Admitting: Pulmonary Disease

## 2016-01-21 VITALS — BP 150/98 | HR 78 | Ht 62.0 in | Wt 131.2 lb

## 2016-01-21 DIAGNOSIS — J438 Other emphysema: Secondary | ICD-10-CM

## 2016-01-21 DIAGNOSIS — J301 Allergic rhinitis due to pollen: Secondary | ICD-10-CM

## 2016-01-21 MED ORDER — LORATADINE-PSEUDOEPHEDRINE ER 10-240 MG PO TB24: 1.0000 | ORAL_TABLET | Freq: Every day | ORAL | Status: DC

## 2016-01-21 MED ORDER — TIOTROPIUM BROMIDE-OLODATEROL 2.5-2.5 MCG/ACT IN AERS: 2.0000 | INHALATION_SPRAY | Freq: Every day | RESPIRATORY_TRACT | Status: DC

## 2016-01-21 MED ORDER — ALBUTEROL SULFATE (2.5 MG/3ML) 0.083% IN NEBU
INHALATION_SOLUTION | RESPIRATORY_TRACT | Status: DC
Start: 2016-01-21 — End: 2016-03-16

## 2016-01-21 MED ORDER — FLUTICASONE PROPIONATE 50 MCG/ACT NA SUSP
2.0000 | Freq: Two times a day (BID) | NASAL | Status: DC | PRN
Start: 2016-01-21 — End: 2017-07-26

## 2016-01-21 NOTE — Patient Instructions (Signed)
Tracy [name]-- it was a pleasure meeting you today...  We refilled your meds per request...  Stay as active as poss, & avoid infections!  Call for any questions or if we can be of service in any way...  Let's plan a follow up visit in 1 year, but sooner if needed for any breathing problems.Marland KitchenMarland Kitchen

## 2016-01-21 NOTE — Progress Notes (Signed)
Subjective:     Patient ID: Tracy [name], female   DOB: 14-Jan-1951, 65 y.o.   MRN: [id number]  HPI  ~  July 12, 2014:  68moROV w/ PW>   Patient presents with  . Follow-up    Pt c/o chest congestion, cough this morning but states that this is not usual for her. Normally has no breathing complaints. CAT score 10.  Mucus is better. No recent abx use. Pt is planning retirement soon.  Pt denies any significant sore throat, nasal congestion or excess secretions, fever, chills, sweats, unintended weight loss, pleurtic or exertional chest pain, orthopnea PND, or leg swelling Pt denies any increase in rescue therapy over baseline, denies waking up needing it or having any early am or nocturnal exacerbations of coughing/wheezing/or dyspnea. Pt also denies any obvious fluctuation in symptoms with weather or environmental change or other alleviating or aggravating factors       Imp>  Gold C Copd:  Chronic obstructive lung disease gold stage C>  Attempt now to consolidate therapy by using combination treatment versus 2 separate inhalers.      Plan>  Stop Spiriva and Symbicort.  Start Stiolto two puff daily.  Prevnar 13 was given today.  Ret 246mo        ~  September 13, 2014:  66m67moV w/ PW>   Patient presents with  . Follow-up    Pt's breathing has been stable, no complaints today. Pt states the stiolto works well for her.   No difficulty with dyspnea. No real cough. No wheezing . Pt denies any significant sore throat, nasal congestion or excess secretions, fever, chills, sweats, unintended weight loss, pleurtic or exertional chest pain, orthopnea PND, or leg swelling Pt denies any increase in rescue therapy over baseline, denies waking up needing it or having any early am or nocturnal exacerbations of coughing/wheezing/or dyspnea. 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      Plan>  Maintain inhaled medications as prescribed.  Flu vaccine was administered.         ~  January 21, 2016:  94m68mo w/ SN>        [name] & [place] physician [place] here;  She retired from [employer] work last year & at age 75 n64 she decided to go w/o insurance coverage;  She sees GYN DrSMiller for primary care needs, DrMann for GI... She has chr DOE but OK w/ ADLs she says, no progression; denies cough, sput, hemoptysis, CP, dizzy, edema..  Smoking Hx>  She started smoking in her late teens and smoked <1ppd for 30 yrs, quitting in 2000, for a <30 pack-yr smoking habit...  Pulmonary Hx>  She notes life-long asthma hx and notes that it ran in her family; she recalls freq URIs/ colds/ infections and was told that they damaged her lungs; she had Spirometry in 2008 & 2011 GOLD Stage3-4 but never had the Full PFTs requested by DrWright in the past; cannot find A1AT level either;   Medical Hx>  She notes Hx white-coat hypertension, not on meds; GYN- DrSMiller serves as her PCP> G2P2, prev on Phentermine for wt loss (but she only weighed 121# w/ BMI=22); Hx osteopenia w/  BMD-2.2 in 2008; GI= DrMann but we do not have records; she has had Hem surg by DrEWilson.  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  EXAM shows Afeb, BP=150/98 ("it's white-coat HBP"), O2sat=97% on RA at rest;  HEENT- neg, mallampati1;  Chest- sl decr BS bilat w/o w/r/r;  Heart- RR w/o m/r/g;  Abd- soft, nontender, neg;  Ext- neg w/o c/c/e;  Neuro- intact...   Oldest Spirometry in Epic 03/2007> FVC=1.87 (65%), FEV1=0.64 (27%), %1sec=34, mid-flows reduced at 6%  predicted  Last CXR 12/16/12 showed norm heart size, marked hyperinflation, NAD...   Last Spirometry 08/2010> FVC=1.66 (53%), FEV1=0.75 (31%), %1sec=45, mid-flows reduced at 13% predicted... There are no FullPFTs done.  Last EKG 09/2012 showed NSR, rate73, poor R prog V1-3, prolonged QT, no STTWA seen...  Last Labs in Epic> 2013= [place]- wnl;  CBC- wnl... LABS 03/2014 by DrSMiller showed norm Chem panel/ CBC/ TSH and neg rectal exam & stool hemoccult => referred to [facility] for colonoscopy. 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     Past Medical History  Diagnosis Date  . Asthma   . COPD (chronic obstructive pulmonary disease) ([place]) 2011    FeV1 31% predicted FeV1/FVX 47 %  . Seasonal allergies     takes Claritin daily prn  . Vitamin D deficiency     takes Vit d every 14 days  . Osteopenia   . Hemorrhoid     Past Surgical History  Procedure Laterality Date  . Tonsillectomy  age 46     recurrent otitis media  . Foot surgery      left bunionectomy  . Examination under anesthesia  10/14/2012    Procedure: EXAM UNDER ANESTHESIA;  Surgeon: Gayland Curry, MD,FACS;  Location: [facility];  Service: General;  Laterality: N/A;  rectal exam under anesthesia excisional hemorrhoidectomy hemorrhoidal banding x two  . Hemorrhoidectomy with hemorrhoid banding  10/14/2012     Procedure: HEMORRHOIDECTOMY WITH HEMORRHOID BANDING;  Surgeon: Gayland Curry, MD,FACS;  Location: [facility];  Service: General;  Laterality: N/A;  . Examination under anesthesia  02/03/2013    excision hemorrhoidal tissue  . Breast enhancement surgery    . Augmentation mammaplasty      saline    Outpatient Encounter Prescriptions as of 01/21/2016  Medication Sig  . albuterol (PROVENTIL) (2.5 MG/3ML) 0.083% nebulizer solution INHALE 3ML VIA NEBULIZER EVERY 4 HOURS AS NEEDED FOR SHORTNESS OF BREATH  . fluticasone (FLONASE) 50 MCG/ACT nasal spray Place 2 sprays into both nostrils 2 (two) times daily as needed.  . loratadine-pseudoephedrine (CLARITIN-D 24 HOUR) 10-240 MG 24 hr tablet Take 1 tablet by mouth daily.  Marland Kitchen  Tiotropium Bromide-Olodaterol (STIOLTO RESPIMAT) 2.5-2.5 MCG/ACT AERS Inhale 2 puffs into the lungs daily.  . [DISCONTINUED] albuterol (PROVENTIL) (2.5 MG/3ML) 0.083% nebulizer solution INHALE 3ML VIA NEBULIZER EVERY 4 HOURS AS NEEDED FOR SHORTNESS OF BREATH  . [DISCONTINUED] fluticasone (FLONASE) 50 MCG/ACT nasal spray Place 2 sprays into both nostrils 2 (two) times daily as needed.  . [DISCONTINUED] loratadine-pseudoephedrine (CLARITIN-D 24 HOUR) 10-240 MG 24 hr tablet Take 1 tablet by mouth daily.  . [DISCONTINUED] phentermine 15 MG capsule Take 1 capsule (15 mg total) by mouth every morning.  . [DISCONTINUED] Tiotropium Bromide-Olodaterol (STIOLTO RESPIMAT) 2.5-2.5 MCG/ACT AERS Inhale 2 puffs into the lungs daily.  . [DISCONTINUED] Vitamin D, Ergocalciferol, (DRISDOL) 50000 UNITS CAPS capsule TAKE ONE CAPSULE BY MOUTH EVERY OTHER WEEK.   No facility-administered encounter medications on file as of 01/21/2016.    Allergies  Allergen Reactions  . Codeine Swelling    Immunization History  Administered Date(s) Administered  . Influenza Split 01/06/2013, 09/13/2013  . Influenza,inj,Quad PF,36+ Mos 09/13/2014, 10/15/2015  . Pneumococcal Conjugate-13 07/12/2014  . Pneumococcal  Polysaccharide-23 01/06/2013  . Tdap 05/15/2013  . Zoster 07/14/2013    Family History  Problem Relation Age of Onset  . Other Father     [place] in snow, broke ankle, blood clot to lungs  . Heart murmur Sister   . Other Brother     h/o being hit by lightening  . Parkinsonism Brother   . Asthma Son   . Allergies Son   . Otitis media Son   . Other Sister     CML  . Asthma Son   . Allergies Son   . Osteoporosis Mother   . Pancreatic cancer Brother     diag 12/21/13-deceased 02/20/2014    Social History   Social History  . Marital Status: Married    Spouse Name: N/A  . Number of Children: N/A  . Years of Education: N/A   Occupational History  . Not on file.   Social History Main Topics  . Smoking status: Former Smoker -- 0.50 packs/day for 20 years    Types: Cigarettes    Quit date: 09/14/1999  . Smokeless tobacco: Never Used  . Alcohol Use: 0.0 oz/week    0 drink(s) per week     Comment: glass of wine  . Drug Use: No  . Sexual Activity:    Partners: Male    Birth Control/ Protection: Post-menopausal   Other Topics Concern  . Not on file   Social History Narrative    Current Medications, Allergies, Past Medical History, Past Surgical History, Family History, and Social History were reviewed in [employer] record.   Review of Systems             All symptoms NEG except where BOLDED >>  Constitutional:  F/C/S, fatigue, anorexia, unexpected weight change. HEENT:  HA, visual changes, hearing loss, earache, nasal symptoms, sore throat, mouth sores, hoarseness. Resp:  cough, sputum, hemoptysis; SOB, tightness, wheezing. Cardio:  CP, palpit, DOE, orthopnea, edema. GI:  N/V/D/C, blood in stool; reflux, abd pain, distention, gas. GU:  dysuria, freq, urgency, hematuria, flank pain, voiding difficulty. MS:  joint pain, swelling, tenderness, decr ROM; neck pain, back pain, etc. Neuro:  HA, tremors, seizures, dizziness, syncope, weakness, numbness,  gait abn. Skin:  suspicious lesions or skin rash. Heme:  adenopathy, bruising, bleeding. Psyche:  confusion, agitation, sleep disturbance, hallucinations, anxiety, depression suicidal.   Objective:   Physical Exam       Vital Signs:  Reviewed...  General:  WD, WN, 65 y/o WF in NAD; alert & oriented; pleasant & cooperative... HEENT:  [place]/AT; Conjunctiva- pink, Sclera- nonicteric, EOM-wnl, PERRLA, EACs-clear, TMs-wnl; NOSE-clear; THROAT-clear & wnl. Neck:  Supple w/ fair ROM; no JVD; normal carotid impulses w/o bruits; no thyromegaly or nodules palpated; no lymphadenopathy. Chest:  Decr BS bilat & unable to augm BS voluntarily; clear without wheezes, rales, or rhonchi heard. Heart:  Regular Rhythm; without murmurs, rubs, or gallops detected. Abdomen:  Soft & nontender- no guarding or rebound; normal bowel sounds; no organomegaly or masses palpated. Ext:  Normal ROM; without deformities or arthritic changes; no varicose veins, venous insuffic, or edema;  Pulses intact w/o bruits. Neuro:  CNs II-XII intact; motor testing normal; sensory testing normal; gait normal & balance OK. Derm:  No lesions noted; no rash etc. Lymph:  No cervical, supraclavicular, axillary, or inguinal adenopathy palpated.   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    2) Hx asthma over the years> we have no old data on her, she indicates no prob from her asthma in yrs- no wheezing etc..Marland Kitchen    3) Chronic allergic & perennial rhinitis> she has been on Flonase & Claritin-D for yrs and takes the latter daily; she is cautioned to STOP the pseudophed rx & try OTC antihist like Claritin/ Zyrtek/ Allegra; she declines allergy referral due to no insurance now...    4) Ex-smoker> she quit in 2000 w/ <30 pack-yr smoking hx...       Medical problems:   HBP> she says  it's white-coat HBP but hasn't been checking BP at home & takes pseudophed daily, she was also on phenteramine for wt loss in the past (while weighing only ~130#); she is instructed to STOP the pseudophed & try plain antihist + Flonase for nose; plus no diet pills as the combo of BP, pseudophed & diet pills could risk a stroke;  She also needs a 2DEcho to check heart, LV, valves and PA pressure- but she refuses due to no insurance...  GI> per DrMann, there are no records in Epic... She has Hemorrhoid surg by DrEWilson of [facility]...   GYN/ PCP> DrSMiller who monitors her Pap smears, Mammograms, BMDs...  Osteopenia & Vit D defic> only recorded BMD in Epic has Tscore -2.2 in 2008; no VitD levels avail in Epic...  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[place] and she has decided to go w/o insurance coverage- therefore requests not to have additional lab work, CXR, Full PFTs at this time;  We have refilled her Stiolto, Albuterol for Neb, Flonase, and the Claritin-D but she is cautioned to use the latter very sparingly & try the OTC Claritin/ Zyrtek/ Allegra by itself; she will monitor her BP at home & rec to establish w/ a PCP in [place]; she will call for any problems & is asked to let us know when she decides to proceed w/ the needed f/u pulmonary testing     Plan:     Patient's Medications  New Prescriptions   No medications on file  Previous Medications   No medications on file  Modified Medications   Modified Medication Previous  Medication   ALBUTEROL (PROVENTIL) (2.5 MG/3ML) 0.083% NEBULIZER SOLUTION albuterol (PROVENTIL) (2.5 MG/3ML) 0.083% nebulizer solution      INHALE 3ML VIA NEBULIZER EVERY 4 HOURS AS NEEDED FOR SHORTNESS OF BREATH    INHALE 3ML VIA NEBULIZER EVERY 4 HOURS AS NEEDED FOR SHORTNESS OF BREATH   FLUTICASONE (FLONASE) 50 MCG/ACT NASAL SPRAY fluticasone  (FLONASE) 50 MCG/ACT nasal spray      Place 2 sprays into both nostrils 2 (two) times daily as needed.    Place 2 sprays into both nostrils 2 (two) times daily as needed.   LORATADINE-PSEUDOEPHEDRINE (CLARITIN-D 24 HOUR) 10-240 MG 24 HR TABLET loratadine-pseudoephedrine (CLARITIN-D 24 HOUR) 10-240 MG 24 hr tablet      Take 1 tablet by mouth daily.    Take 1 tablet by mouth daily.   TIOTROPIUM BROMIDE-OLODATEROL (STIOLTO RESPIMAT) 2.5-2.5 MCG/ACT AERS Tiotropium Bromide-Olodaterol (STIOLTO RESPIMAT) 2.5-2.5 MCG/ACT AERS      Inhale 2 puffs into the lungs daily.    Inhale 2 puffs into the lungs daily.  Discontinued Medications   PHENTERMINE 15 MG CAPSULE    Take 1 capsule (15 mg total) by mouth every morning.   VITAMIN D, ERGOCALCIFEROL, (DRISDOL) 50000 UNITS CAPS CAPSULE    TAKE ONE CAPSULE BY MOUTH EVERY OTHER WEEK.

## 2016-03-16 ENCOUNTER — Other Ambulatory Visit: Payer: Self-pay | Admitting: Pulmonary Disease

## 2016-03-16 MED ORDER — ALBUTEROL SULFATE (2.5 MG/3ML) 0.083% IN NEBU: INHALATION_SOLUTION | RESPIRATORY_TRACT | Status: DC

## 2016-03-16 MED ORDER — TIOTROPIUM BROMIDE-OLODATEROL 2.5-2.5 MCG/ACT IN AERS: 2.0000 | INHALATION_SPRAY | Freq: Every day | RESPIRATORY_TRACT | Status: DC

## 2016-03-16 NOTE — Telephone Encounter (Signed)
Per SN- Ok to refill both meds X1 year.   Spoke with pt, will pick up rx's on Thursday.  Nothing further needed.

## 2016-03-16 NOTE — Telephone Encounter (Signed)
Spoke with pt and she states that her prescription costs have increased. She states that at [place] with SN she had asked about getting her rx's from [place] and [place] had agreed. Pt would like printed rx's for Stiolto and Alb nebs so she can receive mail-order rx's from [place].   SN Please advise.   LOV 01/21/16 Rx's sent to [facility]

## 2016-03-16 NOTE — Addendum Note (Signed)
Addended by: Len Blalock on: 03/16/2016 03:28 PM   Modules accepted: Orders, Medications

## 2016-12-08 ENCOUNTER — Other Ambulatory Visit: Payer: Self-pay | Admitting: Pulmonary Disease

## 2017-01-25 ENCOUNTER — Ambulatory Visit: Payer: Self-pay | Admitting: Pulmonary Disease

## 2017-06-15 ENCOUNTER — Other Ambulatory Visit: Payer: Self-pay | Admitting: Pulmonary Disease

## 2017-07-19 ENCOUNTER — Ambulatory Visit: Payer: Self-pay | Admitting: Pulmonary Disease

## 2017-07-26 ENCOUNTER — Ambulatory Visit (INDEPENDENT_AMBULATORY_CARE_PROVIDER_SITE_OTHER): Payer: Self-pay | Admitting: Pulmonary Disease

## 2017-07-26 ENCOUNTER — Encounter: Payer: Self-pay | Admitting: Pulmonary Disease

## 2017-07-26 VITALS — BP 110/82 | HR 71 | Temp 97.7°F | Ht 62.0 in | Wt 127.0 lb

## 2017-07-26 DIAGNOSIS — J301 Allergic rhinitis due to pollen: Secondary | ICD-10-CM

## 2017-07-26 DIAGNOSIS — J438 Other emphysema: Secondary | ICD-10-CM

## 2017-07-26 DIAGNOSIS — J449 Chronic obstructive pulmonary disease, unspecified: Secondary | ICD-10-CM

## 2017-07-26 MED ORDER — FLUTICASONE PROPIONATE 50 MCG/ACT NA SUSP: 2.0000 | Freq: Two times a day (BID) | NASAL | 5 refills | Status: DC | PRN

## 2017-07-26 MED ORDER — LORATADINE-PSEUDOEPHEDRINE ER 10-240 MG PO TB24: 1.0000 | ORAL_TABLET | Freq: Every day | ORAL | 5 refills | Status: DC

## 2017-07-26 MED ORDER — ALBUTEROL SULFATE (2.5 MG/3ML) 0.083% IN NEBU: INHALATION_SOLUTION | RESPIRATORY_TRACT | 5 refills | Status: DC

## 2017-07-26 MED ORDER — TIOTROPIUM BROMIDE-OLODATEROL 2.5-2.5 MCG/ACT IN AERS: 2.0000 | INHALATION_SPRAY | Freq: Every day | RESPIRATORY_TRACT | 0 refills | Status: AC

## 2017-07-26 MED ORDER — TIOTROPIUM BROMIDE-OLODATEROL 2.5-2.5 MCG/ACT IN AERS: 2.0000 | INHALATION_SPRAY | Freq: Every day | RESPIRATORY_TRACT | 4 refills | Status: DC

## 2017-07-26 MED ORDER — ALBUTEROL SULFATE (2.5 MG/3ML) 0.083% IN NEBU: INHALATION_SOLUTION | RESPIRATORY_TRACT | 12 refills | Status: DC

## 2017-07-26 NOTE — Progress Notes (Signed)
Subjective:     Patient ID: Tracy [name], female   DOB: September 18, 1951, 66 y.o.   MRN: [id number]  HPI 66 y/o WF from [place] (Daughter lives here in [place]) followed for COPD/ emphysema; she does not have insurance & declines f/u CXR, PFT, Ambulatory oximetry, LABS until she is covered by Medicare at age 32; she currently gets her meds from [place]...    ~  July 12, 2014:  49moROV w/ PW>   Patient presents with  . Follow-up    Pt c/o chest congestion, cough this morning but states that this is not usual for her. Normally has no breathing complaints. CAT score 10.  Mucus is better. No recent abx use. Pt is planning retirement soon.  Pt denies any significant sore throat, nasal congestion or excess secretions, fever, chills, sweats, unintended weight loss, pleurtic or exertional chest pain, orthopnea PND, or leg swelling Pt denies any increase in rescue therapy over baseline, denies waking up needing it or having any early am or nocturnal exacerbations of coughing/wheezing/or dyspnea. Pt also denies any obvious fluctuation in symptoms with weather or environmental change or other alleviating or aggravating factors       Imp>  Gold C Copd:  Chronic obstructive lung disease gold stage C>  Attempt now to consolidate therapy by using combination treatment versus 2 separate inhalers.      Plan>  Stop Spiriva and Symbicort.  Start Stiolto two puff daily.  Prevnar 13 was given today.  Ret 230mo        ~  September 13, 2014:  21m45moV w/ PW>   Patient presents with  . Follow-up    Pt's breathing has been stable, no complaints today. Pt states the stiolto works well for her.   No difficulty with dyspnea. No real cough. No wheezing . Pt denies any significant sore throat, nasal congestion or excess secretions, fever, chills, sweats, unintended weight loss, pleurtic or exertional chest pain, orthopnea PND, or leg swelling Pt denies any increase in rescue therapy over baseline, denies  waking up needing it or having any early am or nocturnal exacerbations of coughing/wheezing/or dyspnea. Pt also denies any obvious fluctuation in symptoms with weather or environmental change or other alleviating or aggravating factors       Imp>  COPD with chronic bronchitis:  Chronic obstructive lung disease with chronic bronchitic features stable at this time.      Plan>  Maintain inhaled medications as prescribed.  Flu vaccine was administered.         ~  January 21, 2016:  71m55mo w/ SN>        [name] & [place] her physician coverage here;  She retired from [employer] work last year & at age 19 n44 she decided to go w/o insurance coverage;  She sees GYN DrSMiller for primary care needs, DrMann for GI... She has chr DOE but OK w/ ADLs she says, no progression; denies cough, sput, hemoptysis, CP, dizzy, edema..  Smoking Hx>  She started smoking in her late teens and smoked <1ppd for 30 yrs, quitting in 2000, for a <30 pack-yr smoking habit...  Pulmonary Hx>  She notes life-long asthma hx and notes that it ran in her family; she recalls freq URIs/ colds/ infections and was told that they damaged her lungs; she had Spirometry in 2008 & 2011 GOLD Stage3-4 but never had the Full PFTs requested by  DrWright in the past; cannot find A1AT level either;   Medical Hx>  She notes Hx white-coat hypertension, not on meds; GYN- DrSMiller serves as her PCP> G2P2, prev on Phentermine for wt loss (but she only weighed 121# w/ BMI=22); Hx osteopenia w/ BMD-2.2 in 2008; GI= DrMann but we do not have records; she has had Hem surg by DrEWilson.  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  EXAM shows Afeb, BP=150/98 ("it's white-coat HBP"), O2sat=97% on RA at rest;  HEENT- neg,  mallampati1;  Chest- sl decr BS bilat w/o w/r/r;  Heart- RR w/o m/r/g;  Abd- soft, nontender, neg;  Ext- neg w/o c/c/e;  Neuro- intact...   Oldest Spirometry in Epic 03/2007> FVC=1.87 (65%), FEV1=0.64 (27%), %1sec=34, mid-flows reduced at 6% predicted  Last CXR 12/16/12 showed norm heart size, marked hyperinflation, NAD...   Last Spirometry 08/2010> FVC=1.66 (53%), FEV1=0.75 (31%), %1sec=45, mid-flows reduced at 13% predicted... There are no FullPFTs done.  Last EKG 09/2012 showed NSR, rate73, poor R prog V1-3, prolonged QT, no STTWA seen...  Last Labs in Epic> 2013= [place]- wnl;  CBC- wnl... LABS 03/2014 by DrSMiller showed norm Chem panel/ CBC/ TSH and neg rectal exam & stool hemoccult => referred to [facility] for colonoscopy. 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    ~  July 26, 2017:  14moROV w/ SN>  [name] (she has a new [place] and scheduled this follow up appt;  She notes that she still does not have insurance and notes that she will decline CXR, PFTs, Labs, etc until she is covered w/ insurance under Medicare in 1 year's time;   She indicates that she is doing well, continues on Stiolto 2sp/d + Nebs vs Proair HFA for rescue;  She had only one infectious exac over the last yr & she [place]);  She denies cough, sput, hemoptysis; she notes DOE when walking inclines (stairs, hills) but ok on level ground & ok w/ ADLs; she feels that she is stable; she [place] and needs ref[place] Rx today; she also requests handicap application... We reviewed the following medical problems during today's office visit>  She [place]/ [place]    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    Hx asthma &  AR>  She has not had an asthma attack or infectious resp exac requiring steroids in many yrs; she remains on STIOLTO- 2sp/d + Albuterol rescue via HFA or NEB (has not required recently)...     Hx of "white-coat HBP, diet-pill use in the past, GI- DrMann w/ hemorrhoids & surg by DrEWilson osteopenia & VitD defic, GYN=DrSMiller>  She has not yet established w/ a PCP in [place] & prefers to use Urgent [facility] if needed but indicates that she will set up with a PCP after she gets on [place] in 46yr.. EXAM shows Afeb, BP=110/82, O2sat=95% on RA at rest;  HEENT- neg, mallampati1;  Chest- sl decr BS bilat w/o w/r/r;  Heart- RR w/o m/r/g;  Abd- soft, nontender, neg;  Ext- neg w/o c/c/e;  Neuro- intact...  IMP/PLAN>>  Per pt's instructions-- we have held off on checking a follow up CXR, pulmonary function testing, and ambulatory oximetry testing due to your lack of insurance coverage... But I would recommend proceding w/ these tests when Medicare kicks in;  She will [place] a PCP in [place] area & is rec to get the 2018 FLU vaccine etc;  She understands that she is taking a big risk to go w/o insurance w/ her underlying COPD;  We plan ROV recehck in 169yrsooner as needed prn...     Past Medical  History:  Diagnosis Date  . Asthma   . COPD (chronic obstructive pulmonary disease) ([facility]2011   FeV1 31% predicted FeV1/FVX 47 %  . Hemorrhoid   . Osteopenia   . Seasonal allergies    takes Claritin daily prn  . Vitamin D deficiency    takes Vit d every 14 days    Past Surgical History:  Procedure Laterality Date  . AUGMENTATION MAMMAPLASTY     saline  . BREAST ENHANCEMENT SURGERY    . EXAMINATION UNDER ANESTHESIA  10/14/2012   Procedure: EXAM UNDER ANESTHESIA;  Surgeon: ErGayland CurryMD,FACS;  [facility] Service: General;  Laterality: N/A;  rectal exam under anesthesia excisional hemorrhoidectomy hemorrhoidal banding x two  . EXAMINATION UNDER ANESTHESIA  02/03/2013   excision hemorrhoidal tissue  . FOOT SURGERY     left bunionectomy  . HEMORRHOIDECTOMY WITH HEMORRHOID BANDING  10/14/2012   Procedure: HEMORRHOIDECTOMY WITH HEMORRHOID BANDING;  Surgeon: ErGayland CurryMD,FACS;  [facility] Service: General;  Laterality: N/A;  . TONSILLECTOMY  age [age]  recurrent otitis media    Outpatient Encounter Prescriptions as of 07/26/2017  Medication Sig  . albuterol (PROVENTIL) (2.5 MG/3ML) 0.083% nebulizer solution INHALE 3 MLS (1 VIAL) VIA NEBULIZATION EVERY 4 HOURS AS NEEDED FOR SHORTNESS OF BREATH  . albuterol (PROVENTIL) (2.5 MG/3ML) 0.083% nebulizer solution INHALE 3ML VIA NEBULIZER EVERY 4 HOURS AS NEEDED FOR SHORTNESS OF BREATH  . fluticasone (FLONASE) 50 MCG/ACT nasal spray Place 2 sprays into both nostrils 2 (two) times daily as needed.  . loratadine-pseudoephedrine (CLARITIN-D 24 HOUR) 10-240 MG 24 hr tablet Take 1 tablet by mouth daily.  . Tiotropium Bromide-Olodaterol (STIOLTO RESPIMAT) 2.5-2.5 MCG/ACT AERS Inhale 2 puffs into the lungs daily.  . [DISCONTINUED] albuterol (PROVENTIL) (2.5 MG/3ML) 0.083% nebulizer solution INHALE 3ML VIA NEBULIZER EVERY 4 HOURS AS NEEDED FOR SHORTNESS OF BREATH  . [DISCONTINUED] albuterol (PROVENTIL) (2.5 MG/3ML) 0.083% nebulizer  solution INHALE 3 MLS (1 VIAL) VIA NEBULIZATION EVERY 4 HOURS AS NEEDED FOR SHORTNESS OF BREATH  . [DISCONTINUED] CLARITIN-D 24 HOUR 10-240 MG 24 hr tablet TAKE 1 TABLET BY MOUTH DAILY.  . [  DISCONTINUED] fluticasone (FLONASE) 50 MCG/ACT nasal spray Place 2 sprays into both nostrils 2 (two) times daily as needed.  . [DISCONTINUED] Tiotropium Bromide-Olodaterol (STIOLTO RESPIMAT) 2.5-2.5 MCG/ACT AERS Inhale 2 puffs into the lungs daily.  . Tiotropium Bromide-Olodaterol (STIOLTO RESPIMAT) 2.5-2.5 MCG/ACT AERS Inhale 2 puffs into the lungs daily.  . Tiotropium Bromide-Olodaterol (STIOLTO RESPIMAT) 2.5-2.5 MCG/ACT AERS Inhale 2 puffs into the lungs daily.   No facility-administered encounter medications on file as of 07/26/2017.     Allergies  Allergen Reactions  . Codeine Swelling    Immunization History  Administered Date(s) Administered  . Influenza Split 01/06/2013, 09/13/2013  . Influenza,inj,Quad PF,36+ Mos 09/13/2014, 10/15/2015  . Influenza,inj,Quad PF,6-35 Mos 10/26/2016  . Pneumococcal Conjugate-13 07/12/2014  . Pneumococcal Polysaccharide-23 01/06/2013  . Tdap 05/15/2013  . Zoster 07/14/2013    Family History  Problem Relation Age of Onset  . Other Father        [place] in snow, broke ankle, blood clot to lungs  . Heart murmur Sister   . Other Brother        h/o being hit by lightening  . Parkinsonism Brother   . Asthma Son   . Allergies Son   . Otitis media Son   . Other Sister        CML  . Asthma Son   . Allergies Son   . Osteoporosis Mother   . Pancreatic cancer Brother        diag 2013/12/24-deceased 02/23/2014    Social History   Social History  . Marital status: Married    Spouse name: N/A  . Number of children: N/A  . Years of education: N/A   Occupational History  . Not on file.   Social History Main Topics  . Smoking status: Former Smoker    Packs/day: 0.50    Years: 20.00    Types: Cigarettes    Quit date: 09/14/1999  . Smokeless tobacco: Never  Used  . Alcohol use 0.0 oz/week    0 drink(s) per week     Comment: glass of wine  . Drug use: No  . Sexual activity: Yes    Partners: Male    Birth control/ protection: Post-menopausal   Other Topics Concern  . Not on file   Social History Narrative  . No narrative on file    Immunization History  Administered Date(s) Administered  . Influenza Split 01/06/2013, 09/13/2013  . Influenza,inj,Quad PF,36+ Mos 09/13/2014, 10/15/2015  . Influenza,inj,Quad PF,6-35 Mos 10/26/2016  . Pneumococcal Conjugate-13 07/12/2014  . Pneumococcal Polysaccharide-23 01/06/2013  . Tdap 05/15/2013  . Zoster 07/14/2013    Current Medications, Allergies, Past Medical History, Past Surgical History, Family History, and Social History were reviewed in [employer] record.   Review of Systems             All symptoms NEG except where BOLDED >>  Constitutional:  F/C/S, fatigue, anorexia, unexpected weight change. HEENT:  HA, visual changes, hearing loss, earache, nasal symptoms, sore throat, mouth sores, hoarseness. Resp:  cough, sputum, hemoptysis; SOB, tightness, wheezing. Cardio:  CP, palpit, DOE, orthopnea, edema. GI:  N/V/D/C, blood in stool; reflux, abd pain, distention, gas. GU:  dysuria, freq, urgency, hematuria, flank pain, voiding difficulty. MS:  joint pain, swelling, tenderness, decr ROM; neck pain, back pain, etc. Neuro:  HA, tremors, seizures, dizziness, syncope, weakness, numbness, gait abn. Skin:  suspicious lesions or skin rash. Heme:  adenopathy, bruising, bleeding. Psyche:  confusion, agitation, sleep disturbance, hallucinations, anxiety, depression suicidal.  Objective:   Physical Exam       Vital Signs:  Reviewed...   General:  WD, WN, 66 y/o WF in NAD; alert & oriented; pleasant & cooperative... HEENT:  [place]/AT; Conjunctiva- pink, Sclera- nonicteric, EOM-wnl, PERRLA, EACs-clear, TMs-wnl; NOSE-clear; THROAT-clear & wnl.  Neck:  Supple w/ fair ROM; no  JVD; normal carotid impulses w/o bruits; no thyromegaly or nodules palpated; no lymphadenopathy.  Chest:  Decr BS bilat & unable to augm BS voluntarily; clear without wheezes, rales, or rhonchi heard. Heart:  Regular Rhythm; without murmurs, rubs, or gallops detected. Abdomen:  Soft & nontender- no guarding or rebound; normal bowel sounds; no organomegaly or masses palpated. Ext:  Normal ROM; without deformities or arthritic changes; no varicose veins, venous insuffic, or edema;  Pulses intact w/o bruits. Neuro:  CNs II-XII intact; motor testing normal; sensory testing normal; gait normal & balance OK. Derm:  No lesions noted; no rash etc. Lymph:  No cervical, supraclavicular, axillary, or inguinal adenopathy palpated.   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    2) Hx asthma over the years> we have no old data on her, she indicates no prob from her asthma in yrs- no wheezing etc..Marland Kitchen    3) Chronic allergic & perennial rhinitis> she has been on Flonase & Claritin-D for yrs and takes the latter daily; she is cautioned to STOP the pseudophed rx & try OTC antihist like Claritin/ Zyrtek/ Allegra; she declines allergy referral due to no insurance now...    4) Ex-smoker> she quit in 2000 w/ <30 pack-yr smoking hx...       Medical problems:   HBP> she says it's white-coat HBP but hasn't been checking BP at home & takes pseudophed daily, she was also on phenteramine for wt loss in the past (while weighing only ~130#); she is instructed to STOP the pseudophed & try plain antihist + Flonase for nose; plus no diet pills as the combo of BP, pseudophed & diet pills could risk a stroke;  She also needs a 2DEcho to check heart, LV, valves and PA pressure- but she refuses due to no insurance...  GI> per DrMann, there are no records in Epic... She  has Hemorrhoid surg by DrEWilson of [facility]...   GYN/ PCP> DrSMiller who monitors her Pap smears, Mammograms, BMDs...  Osteopenia & Vit D defic> only recorded BMD in Epic has Tscore -2.2 in 2008; no VitD levels avail in Epic...  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[place] and she has decided to go w/o insurance coverage- therefore requests not to have additional lab work, CXR, Full PFTs at this time;  We have refilled her Stiolto, Albuterol for Neb, Flonase, and the Claritin-D but she is cautioned to use the latter very sparingly & try the OTC Claritin/ Zyrtek/ Allegra by itself; she will monitor her BP at home & rec to establish w/ a PCP in [place]; she will call for any problems & is asked to let us know when she decides to proceed w/ the needed f/u pulmonary testing 07/26/17>   Per pt's instructions-- we have held off on checking a follow up CXR, pulmonary function testing,  and ambulatory oximetry testing due to your lack of insurance coverage... But I would recommend proceding w/ these tests when Medicare kicks in;  She will obtain a PCP in [place] area & is rec to get the 2018 FLU vaccine etc;  She understands that she is taking a big risk to go w/o insurance w/ her underlying COPD;  We plan ROV recehck in 67yr sooner as needed prn...      Plan:     Patient's Medications  New Prescriptions   TIOTROPIUM BROMIDE-OLODATEROL (STIOLTO RESPIMAT) 2.5-2.5 MCG/ACT AERS    Inhale 2 puffs into the lungs daily.   TIOTROPIUM BROMIDE-OLODATEROL (STIOLTO RESPIMAT) 2.5-2.5 MCG/ACT AERS    Inhale 2 puffs into the lungs daily.  Previous Medications   No medications on file  Modified Medications   Modified Medication Previous Medication   ALBUTEROL (PROVENTIL) (2.5 MG/3ML) 0.083% NEBULIZER SOLUTION albuterol (PROVENTIL) (2.5 MG/3ML) 0.083% nebulizer solution      INHALE 3  MLS (1 VIAL) VIA NEBULIZATION EVERY 4 HOURS AS NEEDED FOR SHORTNESS OF BREATH    INHALE 3 MLS (1 VIAL) VIA NEBULIZATION EVERY 4 HOURS AS NEEDED FOR SHORTNESS OF BREATH   ALBUTEROL (PROVENTIL) (2.5 MG/3ML) 0.083% NEBULIZER SOLUTION albuterol (PROVENTIL) (2.5 MG/3ML) 0.083% nebulizer solution      INHALE 3ML VIA NEBULIZER EVERY 4 HOURS AS NEEDED FOR SHORTNESS OF BREATH    INHALE 3ML VIA NEBULIZER EVERY 4 HOURS AS NEEDED FOR SHORTNESS OF BREATH   FLUTICASONE (FLONASE) 50 MCG/ACT NASAL SPRAY fluticasone (FLONASE) 50 MCG/ACT nasal spray      Place 2 sprays into both nostrils 2 (two) times daily as needed.    Place 2 sprays into both nostrils 2 (two) times daily as needed.   LORATADINE-PSEUDOEPHEDRINE (CLARITIN-D 24 HOUR) 10-240 MG 24 HR TABLET CLARITIN-D 24 HOUR 10-240 MG 24 hr tablet      Take 1 tablet by mouth daily.    TAKE 1 TABLET BY MOUTH DAILY.   TIOTROPIUM BROMIDE-OLODATEROL (STIOLTO RESPIMAT) 2.5-2.5 MCG/ACT AERS Tiotropium Bromide-Olodaterol (STIOLTO RESPIMAT) 2.5-2.5 MCG/ACT AERS      Inhale 2 puffs into the lungs daily.    Inhale 2 puffs into the lungs daily.  Discontinued Medications   No medications on file

## 2017-07-26 NOTE — Patient Instructions (Signed)
Today we updated your med list in our EPIC system...    Continue your current medications the same...  We refilled your meds per request...  Per your instructions-- we have held off on checking a follow up CXR, pulmonary function testing, and ambulatory oximetry testing due to your lack of insurance coverage... But I would recommend proceding w/ these tests when Medicare kicks in...  Call for any questions...  Let's plan a follow up visit in 72yr, sooner if needed for problems.Marland KitchenMarland Kitchen

## 2018-03-21 DIAGNOSIS — R69 Illness, unspecified: Secondary | ICD-10-CM | POA: Diagnosis not present

## 2018-07-26 ENCOUNTER — Ambulatory Visit: Payer: Self-pay | Admitting: Pulmonary Disease

## 2018-08-24 ENCOUNTER — Other Ambulatory Visit: Payer: Self-pay | Admitting: Pulmonary Disease

## 2018-08-24 DIAGNOSIS — R69 Illness, unspecified: Secondary | ICD-10-CM | POA: Diagnosis not present

## 2018-09-20 ENCOUNTER — Ambulatory Visit: Payer: Self-pay | Admitting: Pulmonary Disease

## 2018-10-10 ENCOUNTER — Ambulatory Visit: Payer: Self-pay | Admitting: Pulmonary Disease

## 2018-10-10 ENCOUNTER — Ambulatory Visit (INDEPENDENT_AMBULATORY_CARE_PROVIDER_SITE_OTHER): Payer: Medicare HMO

## 2018-10-10 ENCOUNTER — Encounter: Payer: Self-pay | Admitting: Pulmonary Disease

## 2018-10-10 VITALS — BP 134/82 | HR 79 | Temp 97.9°F | Ht 62.0 in | Wt 127.8 lb

## 2018-10-10 DIAGNOSIS — Z0000 Encounter for general adult medical examination without abnormal findings: Secondary | ICD-10-CM

## 2018-10-10 DIAGNOSIS — Z23 Encounter for immunization: Secondary | ICD-10-CM

## 2018-10-10 DIAGNOSIS — J449 Chronic obstructive pulmonary disease, unspecified: Secondary | ICD-10-CM

## 2018-10-10 DIAGNOSIS — J438 Other emphysema: Secondary | ICD-10-CM

## 2018-10-10 DIAGNOSIS — J301 Allergic rhinitis due to pollen: Secondary | ICD-10-CM

## 2018-10-10 MED ORDER — TIOTROPIUM BROMIDE-OLODATEROL 2.5-2.5 MCG/ACT IN AERS: 2.0000 | INHALATION_SPRAY | Freq: Every day | RESPIRATORY_TRACT | 3 refills | Status: DC

## 2018-10-10 MED ORDER — LORATADINE-PSEUDOEPHEDRINE ER 10-240 MG PO TB24: 1.0000 | ORAL_TABLET | Freq: Every day | ORAL | 6 refills | Status: DC

## 2018-10-10 MED ORDER — ALBUTEROL SULFATE (2.5 MG/3ML) 0.083% IN NEBU: INHALATION_SOLUTION | RESPIRATORY_TRACT | 12 refills | Status: DC

## 2018-10-10 NOTE — Patient Instructions (Signed)
Today we updated your med list in our EPIC system...    Continue your current medications the same...  We refilled your prescriptions per request...  At your convenience-- please return to our office one morning FASTING for your blood work & CXR...    We will contact you w/ the results when available...   We will also schedule a complete pulmonary function test to be done here...  We gave you the last of the required pneumonia vaccines today- the second Pneumovax-23 shot...    And we gave your the 2019 FLU shiot...  Keep up the good work w/ exercise, and avoiding resp track infections!  We discussed my up-coming retirement & I will arrange for a follow up appt w/ my young partner- drEllison in 6 months...    We also discussed your options for obtaining a primary care doctor...  Melva,  It has been my honor to have been your doctor over these last few years...    My best wishes to you & your family for a healthy & happy holiday season & new year!!!

## 2018-10-13 ENCOUNTER — Ambulatory Visit (INDEPENDENT_AMBULATORY_CARE_PROVIDER_SITE_OTHER): Payer: Medicare HMO

## 2018-10-13 ENCOUNTER — Ambulatory Visit (HOSPITAL_COMMUNITY)
Admission: EM | Admit: 2018-10-13 | Discharge: 2018-10-13 | Disposition: A | Discharge status: Home / Self Care | Payer: Medicare HMO | Attending: Family Medicine | Admitting: Family Medicine

## 2018-10-13 ENCOUNTER — Encounter (HOSPITAL_COMMUNITY): Payer: Self-pay | Admitting: Emergency Medicine

## 2018-10-13 DIAGNOSIS — S52501A Unspecified fracture of the lower end of right radius, initial encounter for closed fracture: Secondary | ICD-10-CM | POA: Diagnosis not present

## 2018-10-13 DIAGNOSIS — S52591A Other fractures of lower end of right radius, initial encounter for closed fracture: Secondary | ICD-10-CM | POA: Diagnosis not present

## 2018-10-13 NOTE — Discharge Instructions (Addendum)
You may use over the counter Aleve and acetaminophen as needed.

## 2018-10-13 NOTE — ED Provider Notes (Signed)
Tracy [name]   [id number] 10/13/18 Arrival Time: 2993  ASSESSMENT & PLAN:  1. Closed fracture of distal end of right radius, unspecified fracture morphology, initial encounter    Imaging: Dg Forearm Right  Result Date: 10/13/2018 CLINICAL DATA:  Right forearm pain after fall last night. EXAM: RIGHT FOREARM - 2 VIEW COMPARISON:  None. FINDINGS: Minimally displaced distal right radial fracture is noted. The ulna is unremarkable. No soft tissue abnormality is noted. IMPRESSION: Minimally displaced distal right radial fracture. Electronically Signed   By: Marijo Conception, M.D.   On: 10/13/2018 12:23   I have personally viewed the imaging studies ordered this visit.  Follow-up Information    Schedule an appointment as soon as possible for a visit  with Roseanne Kaufman, MD.   Specialty:  Orthopedic Surgery Contact information: [address] [place] [address] [phone]        Tracy [name].   Specialty:  Urgent Care Why:  As needed. Contact information: [place] [address] [phone]         Sugar tong splint applied by orthopaedic tech. Sling for comfort. Prefers OTC analgesics as needed. Written information on fracture and splint care given. To arrange orthopaedic follow up within one week. May f/u here as needed.  Reviewed expectations re: course of current medical issues. Questions answered. Outlined signs and symptoms indicating need for more acute intervention. Patient verbalized understanding. After Visit Summary given.  SUBJECTIVE: History from: patient. Tracy [name] is a 67 y.o. female who reports persistent discomfort of her right distal forearm with swelling. Onset abrupt, beginning last evening after falling on outstretched hand. Discomfort described as aching without radiation. Extremity sensation changes or weakness: none. Self treatment: Tylenol and Aleve  helping with pain. She is right handed. More bruising noticed this morning. No previous injuries to RUE reported. No head injury. No other MSK pain reported. Ambulatory since fall without difficulty.  ROS: As per HPI. All other systems negative.   OBJECTIVE:  Vitals:   10/13/18 1152  BP: (!) 156/69  Pulse: 78  Resp: 16  Temp: 98 F (36.7 C)  SpO2: 97%    General appearance: alert; no distress HEENT: [place]; AT Neck: FROM without midline tenderness; supple Lungs: unlabored respirations Extremities: RUE: obvious distal swelling with bruising; poorly localized tenderness over distal radius; ROM: overall normal with reported discomfort; normal extremity capillary refill and 2+ radial pulse of RUE Back: no midline spinal TTP Skin: warm and dry; no open wounds Neurologic: gait normal; normal reflexes of RUE; normal sensation of RUE Psychological: alert and cooperative; normal mood and affect   Allergies  Allergen Reactions  . Codeine Swelling    Past Medical History:  Diagnosis Date  . Asthma   . COPD (chronic obstructive pulmonary disease) ([place]) 2011   FeV1 31% predicted FeV1/FVX 47 %  . Hemorrhoid   . Osteopenia   . Seasonal allergies    takes Claritin daily prn  . Vitamin D deficiency    takes Vit d every 14 days   Social History   Socioeconomic History  . Marital status: Married    Spouse name: Not on file  . Number of children: Not on file  . Years of education: Not on file  . Highest education level: Not on file  Occupational History  . Not on file  Social Needs  . Financial resource strain: Not on file  . Food insecurity:  Worry: Not on file    Inability: Not on file  . Transportation needs:    Medical: Not on file    Non-medical: Not on file  Tobacco Use  . Smoking status: Former Smoker    Packs/day: 0.50    Years: 20.00    Pack years: 10.00    Types: Cigarettes    Last attempt to quit: 09/14/1999    Years since quitting: 19.0  . Smokeless tobacco:  Never Used  Substance and Sexual Activity  . Alcohol use: Yes    Alcohol/week: 0.0 standard drinks    Comment: glass of wine  . Drug use: No  . Sexual activity: Yes    Partners: Male    Birth control/protection: Post-menopausal  Lifestyle  . Physical activity:    Days per week: Not on file    Minutes per session: Not on file  . Stress: Not on file  Relationships  . Social connections:    Talks on phone: Not on file    Gets together: Not on file    Attends religious service: Not on file    Active member of club or organization: Not on file    Attends meetings of clubs or organizations: Not on file    Relationship status: Not on file  . Intimate partner violence:    Fear of current or ex partner: Not on file    Emotionally abused: Not on file    Physically abused: Not on file    Forced sexual activity: Not on file  Other Topics Concern  . Not on file  Social History Narrative  . Not on file   Family History  Problem Relation Age of Onset  . Osteoporosis Mother   . Other Father        [place] in snow, broke ankle, blood clot to lungs  . Heart murmur Sister   . Other Brother        h/o being hit by lightening  . Parkinsonism Brother   . Asthma Son   . Allergies Son   . Otitis media Son   . Other Sister        CML  . Asthma Son   . Allergies Son   . Pancreatic cancer Brother        diag December 12, 2013-deceased 02-11-2014   Past Surgical History:  Procedure Laterality Date  . AUGMENTATION MAMMAPLASTY     saline  . BREAST ENHANCEMENT SURGERY    . EXAMINATION UNDER ANESTHESIA  10/14/2012   Procedure: EXAM UNDER ANESTHESIA;  Surgeon: Gayland Curry, MD,FACS;  Location: [facility];  Service: General;  Laterality: N/A;  rectal exam under anesthesia excisional hemorrhoidectomy hemorrhoidal banding x two  . EXAMINATION UNDER ANESTHESIA  02/03/2013   excision hemorrhoidal tissue  . FOOT SURGERY     left bunionectomy  . HEMORRHOIDECTOMY WITH HEMORRHOID BANDING  10/14/2012   Procedure:  [id number] WITH HEMORRHOID BANDING;  Surgeon: Gayland Curry, MD,FACS;  Location: [facility];  Service: General;  Laterality: N/A;  . TONSILLECTOMY  age 58    recurrent otitis media      Vanessa Kick, MD 10/13/18 1241

## 2018-10-13 NOTE — Progress Notes (Signed)
Orthopedic Tech Progress Note Patient Details:  Tracy [name] [birth year] [id number]  Ortho Devices Type of Ortho Device: Ace wrap, Arm sling, Sugartong splint Ortho Device/Splint Location: rue Ortho Device/Splint Interventions: Application   Post Interventions Patient Tolerated: Well Instructions Provided: Care of device   Hildred Priest 10/13/2018, 1:44 PM

## 2018-10-13 NOTE — ED Triage Notes (Signed)
Pt states last night she tripped and fell and reached out with her R hand and hurt her R wrist. R wrist is bruised and swollen.

## 2018-10-19 DIAGNOSIS — S52591A Other fractures of lower end of right radius, initial encounter for closed fracture: Secondary | ICD-10-CM | POA: Diagnosis not present

## 2018-10-19 DIAGNOSIS — M25631 Stiffness of right wrist, not elsewhere classified: Secondary | ICD-10-CM | POA: Diagnosis not present

## 2018-10-19 DIAGNOSIS — M25531 Pain in right wrist: Secondary | ICD-10-CM | POA: Diagnosis not present

## 2018-10-24 ENCOUNTER — Other Ambulatory Visit: Payer: Self-pay | Admitting: Pulmonary Disease

## 2018-10-24 ENCOUNTER — Telehealth: Payer: Self-pay | Admitting: Pulmonary Disease

## 2018-10-24 MED ORDER — FIRST-DUKES MOUTHWASH MT SUSP: 5.0000 mL | Freq: Three times a day (TID) | OROMUCOSAL | 0 refills | Status: DC | PRN

## 2018-10-24 MED ORDER — AZITHROMYCIN 250 MG PO TABS: ORAL_TABLET | ORAL | 0 refills | Status: DC

## 2018-10-24 NOTE — Telephone Encounter (Signed)
Per SN- Z pac, take as directed, and Dukes magic mouthwash, 4oz, gargle and swallow, TID.   Called and spoke with Patient.  Recommendations given.  Understanding stated.  Pres[facility] [address].  Nothing further at this time.

## 2018-10-24 NOTE — Telephone Encounter (Signed)
Patient notified that [facility] does not have Dukes magic [facility]wash.  New prescription sent to [facility] for magic mouthwash, 137ml, gargle and swallow TID.  Nothing further at this time.

## 2018-10-24 NOTE — Telephone Encounter (Signed)
Spoke with patient. She stated that since she was last seen by Dr. Lenna Gilford on 10/31 she has developed a cold. She currently just has some nasal drainage and she is losing her voice. She denied any symptoms of SOB, chest pain or fever. She also stated that her husband developed a cold last week and she believes she is developing the same thing.   She wishes to have something called in [facility] to [facility] [address].   Dr. Lenna Gilford, please advise. Thanks!

## 2018-10-26 DIAGNOSIS — S52591A Other fractures of lower end of right radius, initial encounter for closed fracture: Secondary | ICD-10-CM | POA: Diagnosis not present

## 2018-10-26 DIAGNOSIS — S52591D Other fractures of lower end of right radius, subsequent encounter for closed fracture with routine healing: Secondary | ICD-10-CM | POA: Diagnosis not present

## 2018-10-27 ENCOUNTER — Encounter (HOSPITAL_BASED_OUTPATIENT_CLINIC_OR_DEPARTMENT_OTHER): Payer: Self-pay | Admitting: *Deleted

## 2018-10-27 ENCOUNTER — Other Ambulatory Visit: Payer: Self-pay

## 2018-10-27 ENCOUNTER — Other Ambulatory Visit: Payer: Self-pay | Admitting: Orthopedic Surgery

## 2018-10-27 NOTE — Progress Notes (Signed)
Pre  op phone call done. Currently on z pack for uri, pt denies any fever or productive cough, just some post nasal drip and occasional wheezing. Instructed pt to call Dr Levell July office if symptoms worsen prior to surgery.  Pt see Dr Lenna Gilford and knows to call for appointment if needed.

## 2018-11-01 ENCOUNTER — Encounter (HOSPITAL_BASED_OUTPATIENT_CLINIC_OR_DEPARTMENT_OTHER)
Admission: RE | Disposition: A | Discharge status: Home / Self Care | Payer: Self-pay | Source: Ambulatory Visit | Attending: Orthopedic Surgery

## 2018-11-01 ENCOUNTER — Other Ambulatory Visit: Payer: Self-pay

## 2018-11-01 ENCOUNTER — Ambulatory Visit (HOSPITAL_BASED_OUTPATIENT_CLINIC_OR_DEPARTMENT_OTHER)
Admission: RE | Admit: 2018-11-01 | Discharge: 2018-11-01 | Disposition: A | Discharge status: Home / Self Care | Payer: Medicare HMO | Source: Ambulatory Visit | Attending: Orthopedic Surgery | Admitting: Orthopedic Surgery

## 2018-11-01 ENCOUNTER — Encounter (HOSPITAL_BASED_OUTPATIENT_CLINIC_OR_DEPARTMENT_OTHER): Payer: Self-pay

## 2018-11-01 ENCOUNTER — Ambulatory Visit (HOSPITAL_BASED_OUTPATIENT_CLINIC_OR_DEPARTMENT_OTHER): Payer: Medicare HMO | Admitting: Anesthesiology

## 2018-11-01 DIAGNOSIS — Z87891 Personal history of nicotine dependence: Secondary | ICD-10-CM | POA: Insufficient documentation

## 2018-11-01 DIAGNOSIS — E559 Vitamin D deficiency, unspecified: Secondary | ICD-10-CM | POA: Insufficient documentation

## 2018-11-01 DIAGNOSIS — J449 Chronic obstructive pulmonary disease, unspecified: Secondary | ICD-10-CM | POA: Diagnosis not present

## 2018-11-01 DIAGNOSIS — J302 Other seasonal allergic rhinitis: Secondary | ICD-10-CM | POA: Insufficient documentation

## 2018-11-01 DIAGNOSIS — I10 Essential (primary) hypertension: Secondary | ICD-10-CM | POA: Diagnosis not present

## 2018-11-01 DIAGNOSIS — S52571A Other intraarticular fracture of lower end of right radius, initial encounter for closed fracture: Secondary | ICD-10-CM | POA: Insufficient documentation

## 2018-11-01 DIAGNOSIS — X58XXXA Exposure to other specified factors, initial encounter: Secondary | ICD-10-CM | POA: Diagnosis not present

## 2018-11-01 HISTORY — PX: OPEN REDUCTION INTERNAL FIXATION (ORIF) DISTAL RADIAL FRACTURE: SHX5989

## 2018-11-01 SURGERY — OPEN REDUCTION INTERNAL FIXATION (ORIF) DISTAL RADIUS FRACTURE
Anesthesia: Regional | Site: Wrist | Laterality: Right

## 2018-11-01 MED ORDER — PROPOFOL 10 MG/ML IV BOLUS
INTRAVENOUS | Status: DC | PRN
  Administered 2018-11-01 (×3): 20 mg via INTRAVENOUS

## 2018-11-01 MED ORDER — LIDOCAINE 2% (20 MG/ML) 5 ML SYRINGE
INTRAMUSCULAR | Status: AC
  Filled 2018-11-01: qty 5

## 2018-11-01 MED ORDER — LIDOCAINE HCL (CARDIAC) PF 100 MG/5ML IV SOSY
PREFILLED_SYRINGE | INTRAVENOUS | Status: DC | PRN
  Administered 2018-11-01: 20 mg via INTRAVENOUS

## 2018-11-01 MED ORDER — MIDAZOLAM HCL 2 MG/2ML IJ SOLN
1.0000 mg | INTRAMUSCULAR | Status: DC | PRN
  Administered 2018-11-01: 2 mg via INTRAVENOUS

## 2018-11-01 MED ORDER — HYDROCODONE-ACETAMINOPHEN 5-325 MG PO TABS: ORAL_TABLET | ORAL | 0 refills | Status: DC

## 2018-11-01 MED ORDER — ROPIVACAINE HCL 7.5 MG/ML IJ SOLN
INTRAMUSCULAR | Status: DC | PRN
  Administered 2018-11-01: 40 mL via PERINEURAL

## 2018-11-01 MED ORDER — PHENYLEPHRINE 40 MCG/ML (10ML) SYRINGE FOR IV PUSH (FOR BLOOD PRESSURE SUPPORT)
PREFILLED_SYRINGE | INTRAVENOUS | Status: DC | PRN
  Administered 2018-11-01: 40 ug via INTRAVENOUS
  Administered 2018-11-01: 20 ug via INTRAVENOUS

## 2018-11-01 MED ORDER — FENTANYL CITRATE (PF) 100 MCG/2ML IJ SOLN
50.0000 ug | INTRAMUSCULAR | Status: DC | PRN
  Administered 2018-11-01: 50 ug via INTRAVENOUS

## 2018-11-01 MED ORDER — PROPOFOL 500 MG/50ML IV EMUL
INTRAVENOUS | Status: DC | PRN
  Administered 2018-11-01: 14:00:00 via INTRAVENOUS
  Administered 2018-11-01: 75 ug/kg/min via INTRAVENOUS

## 2018-11-01 MED ORDER — SCOPOLAMINE 1 MG/3DAYS TD PT72: 1.0000 | MEDICATED_PATCH | Freq: Once | TRANSDERMAL | Status: DC | PRN

## 2018-11-01 MED ORDER — CEFAZOLIN SODIUM-DEXTROSE 2-4 GM/100ML-% IV SOLN
2.0000 g | INTRAVENOUS | Status: AC
  Administered 2018-11-01: 2 g via INTRAVENOUS

## 2018-11-01 MED ORDER — ONDANSETRON HCL 4 MG/2ML IJ SOLN
INTRAMUSCULAR | Status: DC | PRN
  Administered 2018-11-01: 4 mg via INTRAVENOUS

## 2018-11-01 MED ORDER — CLONIDINE HCL (ANALGESIA) 100 MCG/ML EP SOLN
EPIDURAL | Status: DC | PRN
  Administered 2018-11-01: 80 ug

## 2018-11-01 MED ORDER — PHENYLEPHRINE 40 MCG/ML (10ML) SYRINGE FOR IV PUSH (FOR BLOOD PRESSURE SUPPORT)
PREFILLED_SYRINGE | INTRAVENOUS | Status: AC
  Filled 2018-11-01: qty 10

## 2018-11-01 MED ORDER — CHLORHEXIDINE GLUCONATE 4 % EX LIQD: 60.0000 mL | Freq: Once | CUTANEOUS | Status: DC

## 2018-11-01 MED ORDER — MIDAZOLAM HCL 2 MG/2ML IJ SOLN
INTRAMUSCULAR | Status: AC
  Filled 2018-11-01: qty 2

## 2018-11-01 MED ORDER — ONDANSETRON HCL 4 MG/2ML IJ SOLN
INTRAMUSCULAR | Status: AC
  Filled 2018-11-01: qty 2

## 2018-11-01 MED ORDER — FENTANYL CITRATE (PF) 100 MCG/2ML IJ SOLN
INTRAMUSCULAR | Status: AC
  Filled 2018-11-01: qty 2

## 2018-11-01 MED ORDER — LACTATED RINGERS IV SOLN
INTRAVENOUS | Status: DC
  Administered 2018-11-01: 13:00:00 via INTRAVENOUS

## 2018-11-01 MED ORDER — CEFAZOLIN SODIUM-DEXTROSE 2-4 GM/100ML-% IV SOLN
INTRAVENOUS | Status: AC
  Filled 2018-11-01: qty 100

## 2018-11-01 SURGICAL SUPPLY — 64 items
BANDAGE ACE 3X5.8 VEL STRL LF (GAUZE/BANDAGES/DRESSINGS) ×2 IMPLANT
BIT DRILL 2.0 LNG QUCK RELEASE (BIT) IMPLANT
BIT DRILL 2.8 QUICK RELEASE (BIT) IMPLANT
BLADE SURG 15 STRL LF DISP TIS (BLADE) ×2 IMPLANT
BLADE SURG 15 STRL SS (BLADE) ×4
BNDG CMPR 9X4 STRL LF SNTH (GAUZE/BANDAGES/DRESSINGS) ×1
BNDG ESMARK 4X9 LF (GAUZE/BANDAGES/DRESSINGS) ×2 IMPLANT
BNDG GAUZE ELAST 4 BULKY (GAUZE/BANDAGES/DRESSINGS) ×2 IMPLANT
BNDG PLASTER X FAST 3X3 WHT LF (CAST SUPPLIES) ×20 IMPLANT
BNDG PLSTR 9X3 FST ST WHT (CAST SUPPLIES) ×10
CHLORAPREP W/TINT 26ML (MISCELLANEOUS) ×2 IMPLANT
CORD BIPOLAR FORCEPS 12FT (ELECTRODE) ×2 IMPLANT
COVER BACK TABLE 60X90IN (DRAPES) ×2 IMPLANT
COVER MAYO STAND STRL (DRAPES) ×2 IMPLANT
COVER WAND RF STERILE (DRAPES) IMPLANT
CUFF TOURNIQUET SINGLE 18IN (TOURNIQUET CUFF) ×1 IMPLANT
CUFF TOURNIQUET SINGLE 24IN (TOURNIQUET CUFF) IMPLANT
DRAPE EXTREMITY T 121X128X90 (DRAPE) ×2 IMPLANT
DRAPE OEC MINIVIEW 54X84 (DRAPES) ×2 IMPLANT
DRAPE SURG 17X23 STRL (DRAPES) ×2 IMPLANT
DRILL 2.0 LNG QUICK RELEASE (BIT) ×2
DRILL 2.8 QUICK RELEASE (BIT) ×2
GAUZE SPONGE 4X4 12PLY STRL (GAUZE/BANDAGES/DRESSINGS) ×2 IMPLANT
GAUZE XEROFORM 1X8 LF (GAUZE/BANDAGES/DRESSINGS) ×2 IMPLANT
GLOVE BIO SURGEON STRL SZ7.5 (GLOVE) ×2 IMPLANT
GLOVE BIOGEL PI IND STRL 7.0 (GLOVE) IMPLANT
GLOVE BIOGEL PI IND STRL 8 (GLOVE) ×1 IMPLANT
GLOVE BIOGEL PI IND STRL 8.5 (GLOVE) ×1 IMPLANT
GLOVE BIOGEL PI INDICATOR 7.0 (GLOVE) ×1
GLOVE BIOGEL PI INDICATOR 8 (GLOVE) ×1
GLOVE BIOGEL PI INDICATOR 8.5 (GLOVE) ×1
GLOVE ECLIPSE 6.5 STRL STRAW (GLOVE) ×1 IMPLANT
GLOVE SURG ORTHO 8.0 STRL STRW (GLOVE) ×2 IMPLANT
GOWN STRL REUS W/ TWL LRG LVL3 (GOWN DISPOSABLE) ×1 IMPLANT
GOWN STRL REUS W/TWL LRG LVL3 (GOWN DISPOSABLE) ×2
GOWN STRL REUS W/TWL XL LVL3 (GOWN DISPOSABLE) ×3 IMPLANT
GUIDEWIRE ORTHO 0.054X6 (WIRE) ×6 IMPLANT
NDL HYPO 25X1 1.5 SAFETY (NEEDLE) IMPLANT
NEEDLE HYPO 25X1 1.5 SAFETY (NEEDLE) IMPLANT
NS IRRIG 1000ML POUR BTL (IV SOLUTION) ×2 IMPLANT
PACK BASIN DAY SURGERY FS (CUSTOM PROCEDURE TRAY) ×2 IMPLANT
PAD CAST 3X4 CTTN HI CHSV (CAST SUPPLIES) ×1 IMPLANT
PADDING CAST COTTON 3X4 STRL (CAST SUPPLIES) ×2
PLATE PROXIMAL VDU ACULOC (Plate) ×1 IMPLANT
SCREW CORT FT 18X2.3XLCK HEX (Screw) ×1 IMPLANT
SCREW CORT FT 20X2.3XLCK HEX (Screw) IMPLANT
SCREW CORTICAL LOCKING 2.3X18M (Screw) ×8 IMPLANT
SCREW CORTICAL LOCKING 2.3X20M (Screw) ×4 IMPLANT
SCREW FX18X2.3XSMTH LCK NS CRT (Screw) IMPLANT
SCREW FX20X2.3XSMTH LCK NS CRT (Screw) IMPLANT
SCREW HEXALOBE NON-LOCK 3.5X14 (Screw) ×1 IMPLANT
SCREW NLCKG 13 3.5X13 HEXA (Screw) IMPLANT
SCREW NON-LOCK 3.5X13 (Screw) ×2 IMPLANT
SCREW NONLOCK HEX 3.5X12 (Screw) ×1 IMPLANT
SLEEVE SCD COMPRESS KNEE MED (MISCELLANEOUS) ×1 IMPLANT
SLING ARM FOAM STRAP MED (SOFTGOODS) ×1 IMPLANT
STOCKINETTE 4X48 STRL (DRAPES) ×2 IMPLANT
SUT ETHILON 4 0 PS 2 18 (SUTURE) ×2 IMPLANT
SUT VICRYL 4-0 PS2 18IN ABS (SUTURE) ×2 IMPLANT
SYR BULB 3OZ (MISCELLANEOUS) ×2 IMPLANT
SYR CONTROL 10ML LL (SYRINGE) IMPLANT
TOWEL GREEN STERILE FF (TOWEL DISPOSABLE) ×4 IMPLANT
TOWEL OR NON WOVEN STRL DISP B (DISPOSABLE) ×2 IMPLANT
UNDERPAD 30X30 (UNDERPADS AND DIAPERS) ×2 IMPLANT

## 2018-11-01 NOTE — Op Note (Signed)
11/01/2018 [facility]  Operative Note  Pre Op Diagnosis: Right comminuted intraarticular distal radius fracture  Post Op Diagnosis: Right comminuted intraarticular distal radius fracture  Procedure: ORIF Right comminuted intraarticular distal radius fracture, 2 intraarticular fragments  Surgeon: [name], MD  Assistant: Daryll Brod, MD  Anesthesia: Regional with sedation  Fluids: Per anesthesia flow sheet  EBL: minimal  Complications: None  Specimen: None  Tourniquet Time:  Total Tourniquet Time Documented: Upper Arm (Right) - 46 minutes Total: Upper Arm (Right) - 46 minutes   Disposition: Stable to PACU  INDICATIONS:  Tracy [name] is a 67 y.o. female with right distal radius fracture.  Displaced after initial non operative treatment.  We discussed nonoperative and operative treatment options.  She wished to proceed with operative fixation.  Risks, benefits, and alternatives of surgery were discussed including the risk of blood loss; infection; damage to nerves, vessels, tendons, ligaments, bone; failure of surgery; need for additional surgery; complications with wound healing; continued pain; nonunion; malunion; stiffness.  We also discussed the possible need for bone graft and the benefits and risks including the possibility of disease transmission.  She voiced understanding of these risks and elected to proceed.    OPERATIVE COURSE:  After being identified preoperatively by myself, the patient and I agreed upon the procedure and site of procedure.  Surgical site was marked.  The risks, benefits and alternatives of the surgery were reviewed and she wished to proceed.  Surgical consent had been signed.  She was given IV Ancef as preoperative antibiotic prophylaxis.  She was transferred to the operating room and placed on the operating room table in supine position with the Right upper extremity on an armboard. Sedation was induced by the anesthesiologist.  A  regional block had been performed by anesthesia in preoperative holding.  The Right upper extremity was prepped and draped in normal sterile orthopedic fashion.  A surgical pause was performed between the surgeons, anesthesia and operating room staff, and all were in agreement as to the patient, procedure and site of procedure.  Tourniquet at the proximal aspect of the extremity was inflated to 250 mmHg after exsanguination of the limb with an Esmarch bandage.  Standard volar Mallie Mussel approach was used.  The bipolar electrocautery was used to obtain hemostasis.  The superficial and deep portions of the FCR tendon sheath were incised, and the FCR and FPL were swept ulnarly to protect the palmar cutaneous branch of the median nerve.  The brachioradialis was released at the radial side of the radius.  The pronator quadratus was released and elevated with the periosteal elevator.  The fracture site was identified and mobilized.  The dorsal periosteum was released taking care to protect the extensor tendons.  The fracture was reduced under direct visualization.  There was intraarticular extension. An AcuMed volar distal radial locking plate was selected.  It was secured to the bone with the guidepins.  C-arm was used in AP and lateral projections to ensure appropriate reduction and position of the hardware and adjustments made as necessary.  Standard AO drilling and measuring technique was used.  A single screw was placed in the slotted hole in the shaft of the plate.  The distal holes were filled with locking pegs with the exception of the styloid holes, which were filled with locking screws.  The remaining holes in the shaft of the plate were filled with nonlocking screws.  Good purchase was obtained.  C-arm was used in AP, lateral and oblique  projections to ensure appropriate reduction and position of hardware, which was the case.  There was no intra-articular penetration of hardware.  The wound was copiously irrigated  with sterile saline.  Pronator quadratus was repaired back over top of the plate using 4-0 Vicryl suture.  Vicryl suture was placed in the subcutaneous tissues in an inverted interrupted fashion and the skin was closed with 4-0 nylon in a horizontal mattress fashion.  There was good pronation and supination of the wrist without crepitance.  The wound was then dressed with sterile Xeroform, 4x4s, and wrapped with a Kerlix bandage.  A volar splint was placed and wrapped with Kerlix and Ace bandage.  Tourniquet was deflated at 46 minutes.  Fingertips were pink with brisk capillary refill after deflation of the tourniquet.  Operative drapes were broken down.  The patient was awoken from anesthesia safely.  She was transferred back to the stretcher and taken to the PACU in stable condition.  I will see her back in the office in one week for postoperative followup.  I will give her a prescription for Norco 5/325 1-2 tabs PO q6 hours prn pain, dispense # 20.    [name], MD Electronically signed, 11/01/18

## 2018-11-01 NOTE — Anesthesia Preprocedure Evaluation (Addendum)
Anesthesia Evaluation  Patient identified by MRN, date of birth, ID band Patient awake    Reviewed: Allergy & Precautions, H&P , NPO status , Patient's Chart, lab work & pertinent test results  Airway Mallampati: I  TM Distance: >3 FB Neck ROM: full    Dental  (+) Dental Advisory Given   Pulmonary neg pulmonary ROS, asthma , COPD, former smoker,     + decreased breath sounds      Cardiovascular + DOE   Rhythm:regular Rate:Normal     Neuro/Psych negative neurological ROS  negative psych ROS   GI/Hepatic negative GI ROS, Neg liver ROS,   Endo/Other  negative endocrine ROS  Renal/GU negative Renal ROS     Musculoskeletal negative musculoskeletal ROS (+)   Abdominal   Peds  Hematology negative hematology ROS (+)   Anesthesia Other Findings   Reproductive/Obstetrics negative OB ROS                            Anesthesia Physical  Anesthesia Plan  ASA: III  Anesthesia Plan: Regional   Post-op Pain Management:    Induction: Intravenous  PONV Risk Score and Plan: 2 and Ondansetron and Propofol infusion  Airway Management Planned:   Additional Equipment:   Intra-op Plan:   Post-operative Plan: Extubation in OR  Informed Consent: I have reviewed the patients History and Physical, chart, labs and discussed the procedure including the risks, benefits and alternatives for the proposed anesthesia with the patient or authorized representative who has indicated his/her understanding and acceptance.   Dental advisory given  Plan Discussed with: CRNA  Anesthesia Plan Comments:         Anesthesia Quick Evaluation

## 2018-11-01 NOTE — Anesthesia Procedure Notes (Signed)
Anesthesia Regional Block: Interscalene brachial plexus block   Pre-Anesthetic Checklist: ,, timeout performed, Correct Patient, Correct Site, Correct Laterality, Correct Procedure, Correct Position, site marked, Risks and benefits discussed,  Surgical consent,  Pre-op evaluation,  At surgeon's request and post-op pain management  Laterality: Right  Prep: chloraprep       Needles:  Injection technique: Single-shot  Needle Type: Stimulator Needle - 40     Needle Length: 4cm  Needle Gauge: 22     Additional Needles:   Procedures:,,,, ultrasound used (permanent image in chart),,,,  Narrative:  Start time: 11/01/2018 12:45 PM End time: 11/01/2018 12:50 PM Injection made incrementally with aspirations every 5 mL.  Performed by: Personally  Anesthesiologist: Nolon Nations, MD  Additional Notes: BP cuff, EKG monitors applied. Sedation begun. Nerve location verified with U/S. Anesthetic injected incrementally, slowly , and after neg aspirations under direct u/s guidance. Good perineural spread. Tolerated well.

## 2018-11-01 NOTE — H&P (Signed)
Tracy [name] is an 67 y.o. female.   Chief Complaint: right wrist fracture HPI: 67 yo female with right distal radius fracture.  Displacement after initial attempts an non operative treatment.  She wishes to have surgical fixation.  Allergies:  Allergies  Allergen Reactions  . Codeine Swelling    Past Medical History:  Diagnosis Date  . Asthma   . COPD (chronic obstructive pulmonary disease) ([place]) 2011   FeV1 31% predicted FeV1/FVX 47 %  . Hemorrhoid   . Osteopenia   . Seasonal allergies    takes Claritin daily prn  . Vitamin D deficiency    takes Vit d every 14 days    Past Surgical History:  Procedure Laterality Date  . AUGMENTATION MAMMAPLASTY     saline  . BREAST ENHANCEMENT SURGERY    . EXAMINATION UNDER ANESTHESIA  10/14/2012   Procedure: EXAM UNDER ANESTHESIA;  Surgeon: Gayland Curry, MD,FACS;  Location: [facility];  Service: General;  Laterality: N/A;  rectal exam under anesthesia excisional hemorrhoidectomy hemorrhoidal banding x two  . EXAMINATION UNDER ANESTHESIA  02/03/2013   excision hemorrhoidal tissue  . FOOT SURGERY     left bunionectomy  . HEMORRHOIDECTOMY WITH HEMORRHOID BANDING  10/14/2012   Procedure: HEMORRHOIDECTOMY WITH HEMORRHOID BANDING;  Surgeon: Gayland Curry, MD,FACS;  Location: [facility];  Service: General;  Laterality: N/A;  . TONSILLECTOMY  age 8    recurrent otitis media    Family History: Family History  Problem Relation Age of Onset  . Osteoporosis Mother   . Other Father        [place] in snow, broke ankle, blood clot to lungs  . Heart murmur Sister   . Other Brother        h/o being hit by lightening  . Parkinsonism Brother   . Asthma Son   . Allergies Son   . Otitis media Son   . Other Sister        CML  . Asthma Son   . Allergies Son   . Pancreatic cancer Brother        diag 12-07-2013-deceased 2014/02/06    Social History:   reports that she quit smoking about 19 years ago. Her smoking use included cigarettes. She has a 10.00  pack-year smoking history. She has never used smokeless tobacco. She reports that she drinks alcohol. She reports that she does not use drugs.  Medications: Medications Prior to Admission  Medication Sig Dispense Refill  . albuterol (PROVENTIL) (2.5 MG/3ML) 0.083% nebulizer solution INHALE 3ML VIA NEBULIZER EVERY 4 HOURS AS NEEDED FOR SHORTNESS OF BREATH 120 mL 12  . azithromycin (ZITHROMAX) 250 MG tablet Take as directed 6 tablet 0  . Diphenhyd-Hydrocort-Nystatin (FIRST-DUKES MOUTHWASH) SUSP Use as directed 5 mLs in the mouth or throat 3 (three) times daily as needed. 120 mL 0  . loratadine-pseudoephedrine (CLARITIN-D 24 HOUR) 10-240 MG 24 hr tablet Take 1 tablet by mouth daily. 30 tablet 6  . Tiotropium Bromide-Olodaterol (STIOLTO RESPIMAT) 2.5-2.5 MCG/ACT AERS Inhale 2 puffs into the lungs daily. 3 Inhaler 3    No results found for this or any previous visit (from the past 48 hour(s)).  No results found.   A comprehensive review of systems was negative.  Blood pressure 136/80, pulse 84, temperature 97.8 F (36.6 C), temperature source Oral, resp. rate 16, height 5\' 2"  (1.575 m), weight 58 kg, last menstrual period 12/14/2000, SpO2 97 %.  General appearance: alert, cooperative and appears stated age Head: Normocephalic, without obvious abnormality,  atraumatic Neck: supple, symmetrical, trachea midline Cardio: regular rate and rhythm Resp: clear to auscultation bilaterally Extremities: Intact sensation and capillary refill all digits.  +epl/fpl/io.  No wounds.  Pulses: 2+ and symmetric Skin: Skin color, texture, turgor normal. No rashes or lesions Neurologic: Grossly normal Incision/Wound: none  Assessment/Plan Right distal radius fracture.  Non operative and operative treatment options have been discussed with the patient and patient wishes to proceed with operative treatment. Risks, benefits, and alternatives of surgery have been discussed and the patient agrees with the plan of  care.  We also discussed possible need for bone graft and the low but possible risk of disease transmission.  She voiced understanding of these risks and elected to proceed.    Tracy [name] 11/01/2018, 12:30 PM

## 2018-11-01 NOTE — Progress Notes (Signed)
Assisted Dr. Germeroth with right, ultrasound guided, axillary block. Side rails up, monitors on throughout procedure. See vital signs in flow sheet. Tolerated Procedure well. 

## 2018-11-01 NOTE — Transfer of Care (Signed)
Immediate Anesthesia Transfer of Care Note  Patient: Tracy [name]  Procedure(s) Performed: OPEN REDUCTION INTERNAL FIXATION (ORIF) DISTAL RADIAL FRACTURE (Right Wrist)  Patient Location: PACU  Anesthesia Type:MAC combined with regional for post-op pain  Level of Consciousness: awake, alert , oriented and patient cooperative  Airway & Oxygen Therapy: Patient Spontanous Breathing  Post-op Assessment: Report given to RN and Post -op Vital signs reviewed and stable  Post vital signs: Reviewed and stable  Last Vitals:  Vitals Value Taken Time  BP 111/61 11/01/2018  2:25 PM  Temp    Pulse 72 11/01/2018  2:27 PM  Resp 21 11/01/2018  2:27 PM  SpO2 92 % 11/01/2018  2:27 PM  Vitals shown include unvalidated device data.  Last Pain:  Vitals:   11/01/18 1211  TempSrc: Oral  PainSc: 0-No pain         Complications: No apparent anesthesia complications

## 2018-11-01 NOTE — Anesthesia Postprocedure Evaluation (Signed)
Anesthesia Post Note  Patient: Tracy [name]  Procedure(s) Performed: OPEN REDUCTION INTERNAL FIXATION (ORIF) DISTAL RADIAL FRACTURE (Right Wrist)     Patient location during evaluation: PACU Anesthesia Type: Regional Level of consciousness: awake and alert Pain management: pain level controlled Vital Signs Assessment: post-procedure vital signs reviewed and stable Respiratory status: spontaneous breathing Cardiovascular status: stable Anesthetic complications: no    Last Vitals:  Vitals:   11/01/18 1500 11/01/18 1524  BP: 126/61 132/67  Pulse:  67  Resp: 17   Temp:  (!) 36.4 C  SpO2: 99% 97%    Last Pain:  Vitals:   11/01/18 1517  TempSrc:   PainSc: 0-No pain                 Nolon Nations

## 2018-11-01 NOTE — Discharge Instructions (Addendum)

## 2018-11-01 NOTE — Op Note (Signed)
I assisted Surgeon(s) and Role:    * [name], MD - Primary    Daryll Brod, MD - Assisting on the Procedure(s): OPEN REDUCTION INTERNAL FIXATION (ORIF) DISTAL RADIAL FRACTURE on 11/01/2018.  I provided assistance on this case as follows:set up, approach, debridement, reduction, stabilization and fixation of the fracture, followed by closure of the wound and application of the dressings and splint. Electronically signed by: Daryll Brod, MD Date: 11/01/2018 Time: 2:22 PM

## 2018-11-02 NOTE — Addendum Note (Signed)
Addendum  created 11/02/18 1037 by Tawni Millers, CRNA   Charge Capture section accepted

## 2018-11-03 ENCOUNTER — Encounter (HOSPITAL_BASED_OUTPATIENT_CLINIC_OR_DEPARTMENT_OTHER): Payer: Self-pay | Admitting: Orthopedic Surgery

## 2018-11-08 DIAGNOSIS — S52591D Other fractures of lower end of right radius, subsequent encounter for closed fracture with routine healing: Secondary | ICD-10-CM | POA: Diagnosis not present

## 2018-11-09 DIAGNOSIS — S52591D Other fractures of lower end of right radius, subsequent encounter for closed fracture with routine healing: Secondary | ICD-10-CM | POA: Diagnosis not present

## 2018-11-09 DIAGNOSIS — M25631 Stiffness of right wrist, not elsewhere classified: Secondary | ICD-10-CM | POA: Diagnosis not present

## 2018-11-09 DIAGNOSIS — M25531 Pain in right wrist: Secondary | ICD-10-CM | POA: Diagnosis not present

## 2018-11-16 ENCOUNTER — Telehealth: Payer: Self-pay | Admitting: Pulmonary Disease

## 2018-11-16 NOTE — Telephone Encounter (Signed)
Spoke with [place] at [facility] to verify pt's dx for [employer].  Nothing further needed.

## 2018-12-14 DIAGNOSIS — S5291XB Unspecified fracture of right forearm, initial encounter for open fracture type I or II: Secondary | ICD-10-CM

## 2018-12-14 HISTORY — DX: Unspecified fracture of right forearm, initial encounter for open fracture type I or II: S52.91XB

## 2018-12-21 DIAGNOSIS — R69 Illness, unspecified: Secondary | ICD-10-CM | POA: Diagnosis not present

## 2018-12-23 DIAGNOSIS — S52591D Other fractures of lower end of right radius, subsequent encounter for closed fracture with routine healing: Secondary | ICD-10-CM | POA: Diagnosis not present

## 2018-12-28 DIAGNOSIS — H25813 Combined forms of age-related cataract, bilateral: Secondary | ICD-10-CM | POA: Diagnosis not present

## 2019-01-21 DIAGNOSIS — R69 Illness, unspecified: Secondary | ICD-10-CM | POA: Diagnosis not present

## 2019-01-27 DIAGNOSIS — S52591D Other fractures of lower end of right radius, subsequent encounter for closed fracture with routine healing: Secondary | ICD-10-CM | POA: Diagnosis not present

## 2019-03-07 DIAGNOSIS — R69 Illness, unspecified: Secondary | ICD-10-CM | POA: Diagnosis not present

## 2019-04-27 DIAGNOSIS — R69 Illness, unspecified: Secondary | ICD-10-CM | POA: Diagnosis not present

## 2019-06-20 ENCOUNTER — Telehealth: Payer: Self-pay | Admitting: Pulmonary Disease

## 2019-06-20 NOTE — Telephone Encounter (Signed)
How much is the medication for the patient to pick up at the pharmacy?  Is she in the donut hole?  It looks like last entry in the chart was in December/2019 where she was receiving her Stiolto from a [employer].  Has this stopped?  I do not have a problem supplying the patient with a Stiolto sample in the short-term.  But I like to further understand what chronic problem she may be having with obtaining this medication.  Patient can be referred to [facility] for medication management where she can speak with the pharmacist and they can see if they can get that her medications more affordable for her.  Please find out more information from the patient.  Okay to pull 1 Stiolto sample to be placed upfront.  Please ensure the patient is comfortable and competent with using her Stiolto Respimat inhaler, if not she can be taught again when she picks up the Stiolto Respimat inhaler upfront.  Wyn Quaker, FNP

## 2019-06-20 NOTE — Telephone Encounter (Signed)
Pt is requesting samples of Stiolto. Pt is a former Dr. Lenna Gilford pt that has an appt scheduled with Dr. Valeta Harms 8/10 for a follow up with PFT prior.  Aaron Edelman, please advise if you are okay with [name] giving pt samples of Stiolto. Thanks!

## 2019-06-21 MED ORDER — STIOLTO RESPIMAT 2.5-2.5 MCG/ACT IN AERS: 2.0000 | INHALATION_SPRAY | Freq: Every day | RESPIRATORY_TRACT | 0 refills | Status: DC

## 2019-06-21 NOTE — Telephone Encounter (Signed)
Spoke with pt. 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 States that they advised her that due to [place] her shipment will delayed. This is why she is asking for samples. I have placed the samples at the front door for pick up. Nothing further was needed.

## 2019-06-26 DIAGNOSIS — R69 Illness, unspecified: Secondary | ICD-10-CM | POA: Diagnosis not present

## 2019-07-12 ENCOUNTER — Other Ambulatory Visit: Payer: Self-pay | Admitting: Pulmonary Disease

## 2019-07-20 ENCOUNTER — Other Ambulatory Visit (HOSPITAL_COMMUNITY): Payer: Medicare HMO

## 2019-07-24 ENCOUNTER — Other Ambulatory Visit: Payer: Self-pay

## 2019-07-24 ENCOUNTER — Encounter: Payer: Self-pay | Admitting: Pulmonary Disease

## 2019-07-24 ENCOUNTER — Ambulatory Visit: Payer: Medicare HMO | Admitting: Pulmonary Disease

## 2019-07-24 VITALS — BP 118/78 | HR 84 | Temp 97.7°F | Wt 127.0 lb

## 2019-07-24 DIAGNOSIS — J449 Chronic obstructive pulmonary disease, unspecified: Secondary | ICD-10-CM

## 2019-07-24 DIAGNOSIS — Z7689 Persons encountering health services in other specified circumstances: Secondary | ICD-10-CM

## 2019-07-24 DIAGNOSIS — J301 Allergic rhinitis due to pollen: Secondary | ICD-10-CM | POA: Diagnosis not present

## 2019-07-24 DIAGNOSIS — J432 Centrilobular emphysema: Secondary | ICD-10-CM

## 2019-07-24 MED ORDER — CLARITIN-D 24 HOUR 10-240 MG PO TB24: 1.0000 | ORAL_TABLET | Freq: Every day | ORAL | 6 refills | Status: DC

## 2019-07-24 MED ORDER — TRELEGY ELLIPTA 100-62.5-25 MCG/INH IN AEPB: 1.0000 | INHALATION_SPRAY | Freq: Every day | RESPIRATORY_TRACT | 3 refills | Status: DC

## 2019-07-24 MED ORDER — ALBUTEROL SULFATE (2.5 MG/3ML) 0.083% IN NEBU: INHALATION_SOLUTION | RESPIRATORY_TRACT | 12 refills | Status: DC

## 2019-07-24 MED ORDER — TRELEGY ELLIPTA 100-62.5-25 MCG/INH IN AEPB: 1.0000 | INHALATION_SPRAY | Freq: Every day | RESPIRATORY_TRACT | 0 refills | Status: DC

## 2019-07-24 MED ORDER — BREO ELLIPTA 100-25 MCG/INH IN AEPB: 1.0000 | INHALATION_SPRAY | Freq: Every day | RESPIRATORY_TRACT | 3 refills | Status: DC

## 2019-07-24 NOTE — Patient Instructions (Addendum)
Thank you for visiting Dr. Valeta Harms at [facility].  Today we recommend the following:  Orders Placed This Encounter  Procedures  . Pulmonary Function Test   Meds ordered this encounter  Medications  . Fluticasone-Umeclidin-Vilant (TRELEGY ELLIPTA) 100-62.5-25 MCG/INH AEPB    Sig: Inhale 1 puff into the lungs daily.    Dispense:  2 each    Refill:  0    Order Specific Question:   Lot Number?    Answer:   [id number]    Order Specific Question:   Manufacturer?    Answer:   [employer] [[id number]]  . loratadine-pseudoephedrine (CLARITIN-D 24 HOUR) 10-240 MG 24 hr tablet    Sig: Take 1 tablet by mouth daily.    Dispense:  30 tablet    Refill:  6  . albuterol (PROVENTIL) (2.5 MG/3ML) 0.083% nebulizer solution    Sig: INHALE 3ML VIA NEBULIZER EVERY 4 HOURS AS NEEDED FOR SHORTNESS OF BREATH    Dispense:  120 mL    Refill:  12    Dx Code J43.8   Return in about 1 year (around 07/23/2020). with full PFTS     Please do your part to reduce the spread of COVID-19.

## 2019-07-24 NOTE — Progress Notes (Signed)
Synopsis: Referred in August 2020 to establish care former patient of Noralee Space, MD  Subjective:   PATIENT ID: Tracy [name] GENDER: female DOB: [birth year], MRN: [id number]  Chief Complaint  Patient presents with  . New Patient (Initial Visit)    Former patient of Dr. Lenna Gilford. Reports wheezing and occassional SOB. Using [employer] daily.    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 Lenna Gilford.  Last seen in the office August 2018.  At the time was trying to get established with Medicare.  Currently doing well on Stiolto plus nebulizer plus albuterol rescue inhaler.  Within a years time was treated with antibiotics for an exacerbation.  She gets most of her medications from a mail order pharmacy in [place].  Dr. Lenna Gilford wanted follow-up pulmonary function tests on last office visit 2018 but patient declined.  Overall here today for medication refills.  She is having difficulty with affording the Stiolto and would like to try something else.  She has not had pulmonary function test completed in the past or anytime soon.  She would like to wait until most of the COVID coronavirus issues have declined.  She wanted to meet me today before getting new prescriptions or medications.  Her respiratory symptoms are stable.  She does have significant shortness of breath or chest tightness when she is outside or the weather is extremely hot or humid.  She denies hemoptysis cough fever sputum production.    Past Medical History:  Diagnosis Date  . Asthma   . COPD (chronic obstructive pulmonary disease) ([place]) 2011   FeV1 31% predicted FeV1/FVX 47 %  . Hemorrhoid   . Osteopenia   . Seasonal allergies    takes Claritin daily prn  . Vitamin D deficiency    takes Vit d every 14 days     Family History  Problem Relation Age of Onset  . Osteoporosis Mother   . Other Father        [place] in snow, broke ankle, blood clot to lungs  . Heart murmur Sister   . Other Brother        h/o being hit by lightening  . Parkinsonism Brother   . Asthma Son   . Allergies Son   . Otitis media Son   . Other Sister        CML  . Asthma Son   . Allergies Son   . Pancreatic cancer Brother        diag 18-Dec-2013-deceased February 17, 2014     Past Surgical History:  Procedure Laterality Date  . AUGMENTATION MAMMAPLASTY     saline  . BREAST ENHANCEMENT SURGERY    . EXAMINATION UNDER ANESTHESIA  10/14/2012   Procedure: EXAM UNDER ANESTHESIA;  Surgeon: Gayland Curry, MD,FACS;  Location: [place];  Service: General;  Laterality: N/A;  rectal exam under anesthesia excisional hemorrhoidectomy hemorrhoidal banding x two  . EXAMINATION UNDER ANESTHESIA  02/03/2013   excision hemorrhoidal tissue  . FOOT SURGERY     left bunionectomy  . HEMORRHOIDECTOMY WITH HEMORRHOID BANDING  10/14/2012   Procedure: HEMORRHOIDECTOMY WITH HEMORRHOID BANDING;  Surgeon: Gayland Curry, MD,FACS;  Location: [place];  Service: General;  Laterality: N/A;  . OPEN REDUCTION INTERNAL FIXATION (ORIF) DISTAL RADIAL FRACTURE Right 11/01/2018   Procedure: OPEN REDUCTION INTERNAL FIXATION (ORIF) DISTAL RADIAL FRACTURE;  Surgeon: [name], MD;  Location: [place];  Service: Orthopedics;  Laterality: Right;  . TONSILLECTOMY  age 49  recurrent otitis media    Social History   Socioeconomic History  . Marital status: Married    Spouse name: Not on file  . Number of children: Not on file  . Years of education: Not on file  . Highest education level: Not on file  Occupational History  . Not on file  Social Needs  . Financial resource strain: Not on file  . Food insecurity    Worry: Not on file    Inability: Not on file  . Transportation needs    Medical: Not on file    Non-medical: Not on file  Tobacco Use  . Smoking status: Former Smoker    Packs/day: 0.50    Years: 20.00    Pack years: 10.00    Types: Cigarettes    Quit date: 09/14/1999    Years since quitting: 19.8  . Smokeless tobacco:  Never Used  Substance and Sexual Activity  . Alcohol use: Yes    Alcohol/week: 0.0 standard drinks    Comment: glass of wine  . Drug use: No  . Sexual activity: Yes    Partners: Male    Birth control/protection: Post-menopausal  Lifestyle  . Physical activity    Days per week: Not on file    Minutes per session: Not on file  . Stress: Not on file  Relationships  . Social Herbalist on phone: Not on file    Gets together: Not on file    Attends religious service: Not on file    Active member of club or organization: Not on file    Attends meetings of clubs or organizations: Not on file    Relationship status: Not on file  . Intimate partner violence    Fear of current or ex partner: Not on file    Emotionally abused: Not on file    Physically abused: Not on file    Forced sexual activity: Not on file  Other Topics Concern  . Not on file  Social History Narrative  . Not on file     Allergies  Allergen Reactions  . Codeine Swelling     Outpatient Medications Prior to Visit  Medication Sig Dispense Refill  . albuterol (PROVENTIL) (2.5 MG/3ML) 0.083% nebulizer solution INHALE 3ML VIA NEBULIZER EVERY 4 HOURS AS NEEDED FOR SHORTNESS OF BREATH 120 mL 12  . azithromycin (ZITHROMAX) 250 MG tablet Take as directed 6 tablet 0  . Diphenhyd-Hydrocort-Nystatin (FIRST-DUKES MOUTHWASH) SUSP Use as directed 5 mLs in the mouth or throat 3 (three) times daily as needed. 120 mL 0  . HYDROcodone-acetaminophen (NORCO) 5-325 MG tablet 1-2 tabs po q6 hours prn pain 20 tablet 0  . loratadine-pseudoephedrine (CLARITIN-D 24 HOUR) 10-240 MG 24 hr tablet Take 1 tablet by mouth daily. 30 tablet 6  . Tiotropium Bromide-Olodaterol (STIOLTO RESPIMAT) 2.5-2.5 MCG/ACT AERS Inhale 2 puffs into the lungs daily. 3 Inhaler 3  . Tiotropium Bromide-Olodaterol (STIOLTO RESPIMAT) 2.5-2.5 MCG/ACT AERS Inhale 2 puffs into the lungs daily. 8 g 0   No facility-administered medications prior to visit.      Review of Systems  Constitutional: Negative for chills, fever, malaise/fatigue and weight loss.  HENT: Negative for hearing loss, sore throat and tinnitus.   Eyes: Negative for blurred vision and double vision.  Respiratory: Positive for shortness of breath. Negative for cough, hemoptysis, sputum production, wheezing and stridor.   Cardiovascular: Negative for chest pain, palpitations, orthopnea, leg swelling and PND.  Gastrointestinal: Negative for abdominal pain, constipation, diarrhea,  heartburn, nausea and vomiting.  Genitourinary: Negative for dysuria, hematuria and urgency.  Musculoskeletal: Negative for joint pain and myalgias.  Skin: Negative for itching and rash.  Neurological: Negative for dizziness, tingling, weakness and headaches.  Endo/Heme/Allergies: Negative for environmental allergies. Does not bruise/bleed easily.  Psychiatric/Behavioral: Negative for depression. The patient is not nervous/anxious and does not have insomnia.   All other systems reviewed and are negative.    Objective:  Physical Exam Vitals signs reviewed.  Constitutional:      General: She is not in acute distress.    Appearance: She is well-developed.  HENT:     Head: Normocephalic and atraumatic.  Eyes:     General: No scleral icterus.    Conjunctiva/sclera: Conjunctivae normal.     Pupils: Pupils are equal, round, and reactive to light.  Neck:     Musculoskeletal: Neck supple.     Vascular: No JVD.     Trachea: No tracheal deviation.  Cardiovascular:     Rate and Rhythm: Normal rate and regular rhythm.     Heart sounds: Normal heart sounds. No murmur.  Pulmonary:     Effort: Pulmonary effort is normal. No tachypnea, accessory muscle usage or respiratory distress.     Breath sounds: Normal breath sounds. No stridor. No wheezing, rhonchi or rales.  Abdominal:     General: Bowel sounds are normal. There is no distension.     Palpations: Abdomen is soft.     Tenderness: There is no  abdominal tenderness.  Musculoskeletal:        General: No tenderness.  Lymphadenopathy:     Cervical: No cervical adenopathy.  Skin:    General: Skin is warm and dry.     Capillary Refill: Capillary refill takes less than 2 seconds.     Findings: No rash.  Neurological:     Mental Status: She is alert and oriented to person, place, and time.  Psychiatric:        Behavior: Behavior normal.      Vitals:   07/24/19 1204  BP: 118/78  Pulse: 84  Temp: 97.7 F (36.5 C)  TempSrc: Oral  SpO2: 98%  Weight: 127 lb (57.6 kg)   98% on RA BMI Readings from Last 3 Encounters:  07/24/19 23.23 kg/m  11/01/18 23.39 kg/m  10/10/18 23.37 kg/m   Wt Readings from Last 3 Encounters:  07/24/19 127 lb (57.6 kg)  11/01/18 127 lb 13.9 oz (58 kg)  10/10/18 127 lb 12.8 oz (58 kg)     CBC    Component Value Date/Time   WBC 6.2 10/04/2012 1330   RBC 4.41 10/04/2012 1330   HGB 13.8 10/04/2012 1330   HCT 41.1 10/04/2012 1330   PLT 282 10/04/2012 1330   MCV 93.2 10/04/2012 1330   MCH 31.3 10/04/2012 1330   MCHC 33.6 10/04/2012 1330   RDW 12.3 10/04/2012 1330     Chest Imaging: January 2014: Evidence of emphysema on chest x-ray The patient's images have been independently reviewed by me.    Pulmonary Functions Testing Results: No flowsheet data found.  FeNO: None  Pathology: None  Echocardiogram: None  Heart Catheterization: None    Assessment & Plan:     ICD-10-CM   1. Centrilobular emphysema ([place])  J43.2 Pulmonary Function Test  2. COPD with chronic bronchitis ([place])  J44.9   3. Non-seasonal allergic rhinitis due to pollen  J30.1   4. Seasonal allergic rhinitis due to pollen  J30.1   5. Establishing care with new  doctor, encounter for  Z76.89     Discussion:  68 year old female with a diagnosis of COPD.  Prior spirometry with an FEV1 30% predicted.  No full PFTs in the past.  Former smoker.  We will recommend changing her from Stiolto to Trelegy inhaler. This  was on her preferred medication list. Prescriptions for her albuterol nebulizer were also has sent to the pharmacy Samples of Trelegy inhaler were given to her today in the office. Refilled patient's Claritin-D. Recommended starting daily vitamin D supplementation over-the-counter.  Patient to follow-up in 1 year and have full pulmonary function test completed prior to next office visit.  Patient to return to clinic as needed for any shortness of breath or changes in respiratory symptoms.  Patient will need to establish care with new PCP.  Greater than 50% of this patient's 25 minute office visit was spent face-to-face discussing above recommendations treatment plan.   Current Outpatient Medications:  .  albuterol (PROVENTIL) (2.5 MG/3ML) 0.083% nebulizer solution, INHALE 3ML VIA NEBULIZER EVERY 4 HOURS AS NEEDED FOR SHORTNESS OF BREATH, Disp: 120 mL, Rfl: 12 .  azithromycin (ZITHROMAX) 250 MG tablet, Take as directed, Disp: 6 tablet, Rfl: 0 .  Diphenhyd-Hydrocort-Nystatin (FIRST-DUKES MOUTHWASH) SUSP, Use as directed 5 mLs in the mouth or throat 3 (three) times daily as needed., Disp: 120 mL, Rfl: 0 .  HYDROcodone-acetaminophen (NORCO) 5-325 MG tablet, 1-2 tabs po q6 hours prn pain, Disp: 20 tablet, Rfl: 0 .  loratadine-pseudoephedrine (CLARITIN-D 24 HOUR) 10-240 MG 24 hr tablet, Take 1 tablet by mouth daily., Disp: 30 tablet, Rfl: 6 .  Tiotropium Bromide-Olodaterol (STIOLTO RESPIMAT) 2.5-2.5 MCG/ACT AERS, Inhale 2 puffs into the lungs daily., Disp: 3 Inhaler, Rfl: 3 .  Tiotropium Bromide-Olodaterol (STIOLTO RESPIMAT) 2.5-2.5 MCG/ACT AERS, Inhale 2 puffs into the lungs daily., Disp: 8 g, Rfl: 0   [name], DO [facility] 07/24/2019 12:11 PM

## 2019-07-24 NOTE — Progress Notes (Signed)
Patient seen in the office today and instructed on use of Trelegy.  Patient expressed understanding and demonstrated technique.  

## 2019-08-10 DIAGNOSIS — R69 Illness, unspecified: Secondary | ICD-10-CM | POA: Diagnosis not present

## 2019-09-18 ENCOUNTER — Telehealth: Payer: Self-pay | Admitting: Pulmonary Disease

## 2019-09-18 NOTE — Telephone Encounter (Signed)
PCCM: Okay sounds good Thanks BLI   [name], DO [facility] 09/18/2019 5:27 PM

## 2019-10-05 ENCOUNTER — Ambulatory Visit (INDEPENDENT_AMBULATORY_CARE_PROVIDER_SITE_OTHER): Payer: Medicare HMO | Admitting: Adult Health

## 2019-10-05 ENCOUNTER — Telehealth: Payer: Self-pay | Admitting: Pulmonary Disease

## 2019-10-05 ENCOUNTER — Encounter: Payer: Self-pay | Admitting: Adult Health

## 2019-10-05 DIAGNOSIS — J449 Chronic obstructive pulmonary disease, unspecified: Secondary | ICD-10-CM

## 2019-10-05 DIAGNOSIS — R69 Illness, unspecified: Secondary | ICD-10-CM | POA: Diagnosis not present

## 2019-10-05 DIAGNOSIS — J019 Acute sinusitis, unspecified: Secondary | ICD-10-CM

## 2019-10-05 MED ORDER — AMOXICILLIN-POT CLAVULANATE 875-125 MG PO TABS: 1.0000 | ORAL_TABLET | Freq: Two times a day (BID) | ORAL | 0 refills | Status: AC

## 2019-10-05 MED ORDER — PREDNISONE 10 MG PO TABS: ORAL_TABLET | ORAL | 0 refills | Status: DC

## 2019-10-05 NOTE — Telephone Encounter (Signed)
Needs videovisit or televisit for assessment for treatment  May place on my schedule for this evening   Please contact office for sooner follow up if symptoms do not improve or worsen or seek emergency care

## 2019-10-05 NOTE — Patient Instructions (Signed)
Augmentin 875mg  Twice daily  For 10 days  Prednisone taper over next week.  Mucinex DM Twice daily  As needed  Cough/congestion  Saline nasal rinses As needed   Begin Flonase (over the counter ) 2 puff daily  Continue on Claritin daily.  Trelegy 1 puff daily , rinse after use.  Follow up in 2 weeks and As needed  -Video visit  Please contact office for sooner follow up if symptoms do not improve or worsen or seek emergency care

## 2019-10-05 NOTE — Telephone Encounter (Signed)
Called and spoke to pt. Pt c/o sinus congestion with yellow and green mucus, mild prod cough with clear to yellow mucus, increase in SOB x 1 week. Pt denies CP/tightness, f/c/s, and any known contact with anyone with COVID. Pt has Trelegy and Stiolto on her med list. Pt states she tried the trelegy for one month but didn't really want to continue due to the many side effects. Pt states she is currently finishing up a shipment of Stiolto but will not refill the rx due to the cost, pt has been on this for years. Pt denied mychart visit. Pt has a pending one year f/u in 07/2020. Dr. Valeta Harms is unavailable. Will forward to [facility] as APP of the day.   Tammy, please advise. Thanks.

## 2019-10-05 NOTE — Progress Notes (Signed)
Virtual Visit via Telephone Note  I connected with Tracy [name] on 10/05/19 at  1:30 PM EDT by telephone and verified that I am speaking with the correct person using two identifiers.  Location: Patient: Home  Provider: Office    I discussed the limitations, risks, security and privacy concerns of performing an evaluation and management service by telephone and the availability of in person appointments. I also discussed with the patient that there may be a patient responsible charge related to this service. The patient expressed understanding and agreed to proceed.   History of Present Illness: 68 year old female former smoker followed for severe COPD (FEV1 31%)  Today's televisit is for an acute office visit. Patient complains over the last 1 week she has had sinus congestion, pressure and yellow-green nasal discharge.  She is also had increased cough with intermittent discolored mucus Says over the last few weeks she has been having more allergy symptoms.  She says out her allergies are worse in the fall time.  She has been taken Claritin-D.  Patient does have underlying severe COPD is on Stiolto.  Was recently changed from [place] to Trelegy but is finishing up her Stiolto inhaler before she starts Trelegy.  She denies any fever, chest pain, orthopnea, wheezing, edema.  No known sick contacts.  Denies any loss of taste or smell.  Observations/Objective: spirometry with an FEV1 31% predicted  Assessment and Plan: -Acute COPD exacerbation with probable sinusitis-change over to TRELEGY .  Needs CXR on return (last cxr 2014 ) .  Declines COVID 19 testing   -Allergic rhinitis -flare -add Flonase    Plan  Patient Instructions  Augmentin 875mg  Twice daily  For 10 days  Prednisone taper over next week.  Mucinex DM Twice daily  As needed  Cough/congestion  Saline nasal rinses As needed   Begin Flonase (over the counter ) 2 puff daily  Continue on Claritin daily.  Trelegy 1 puff  daily , rinse after use.  Follow up in 2 weeks and As needed  -Video visit  Please contact office for sooner follow up if symptoms do not improve or worsen or seek emergency care       Follow Up Instructions: Close follow up in 2 weeks and As needed   Please contact office for sooner follow up if symptoms do not improve or worsen or seek emergency care       I discussed the assessment and treatment plan with the patient. The patient was provided an opportunity to ask questions and all were answered. The patient agreed with the plan and demonstrated an understanding of the instructions.   The patient was advised to call back or seek an in-person evaluation if the symptoms worsen or if the condition fails to improve as anticipated.  I provided 22 minutes of non-face-to-face time during this encounter.   Rexene Edison, NP

## 2019-10-05 NOTE — Telephone Encounter (Signed)
Called and scheduled a televisit with TP this afternoon. Pt verbalized understanding and denied any further questions or concerns at this time.

## 2019-10-11 ENCOUNTER — Telehealth: Payer: Self-pay | Admitting: Adult Health

## 2019-10-11 NOTE — Telephone Encounter (Signed)
Patient had video visit with TP on 10.22.2020 and needs video visit in 2 weeks with TP.  LMOM TCB x1

## 2019-10-13 NOTE — Telephone Encounter (Signed)
Called spoke with patient who reported she is feeling "much better" since 10/22 visit with TP.  Patient is requesting a face-to-face visit so that she may also receive her flu shot.  TP is not in the office week of 11/2 and patient does not want to see another NP.  Appt scheduled for Friday November 13 @ 1030.  Nothing further needed at this time; will sign off.

## 2019-10-27 ENCOUNTER — Ambulatory Visit (INDEPENDENT_AMBULATORY_CARE_PROVIDER_SITE_OTHER): Payer: Medicare HMO

## 2019-10-27 ENCOUNTER — Encounter: Payer: Self-pay | Admitting: Adult Health

## 2019-10-27 ENCOUNTER — Other Ambulatory Visit: Payer: Self-pay

## 2019-10-27 ENCOUNTER — Ambulatory Visit: Payer: Medicare HMO | Admitting: Adult Health

## 2019-10-27 VITALS — BP 108/64 | HR 83 | Temp 97.1°F | Ht 62.0 in | Wt 129.0 lb

## 2019-10-27 DIAGNOSIS — Z23 Encounter for immunization: Secondary | ICD-10-CM | POA: Diagnosis not present

## 2019-10-27 DIAGNOSIS — J301 Allergic rhinitis due to pollen: Secondary | ICD-10-CM

## 2019-10-27 DIAGNOSIS — J449 Chronic obstructive pulmonary disease, unspecified: Secondary | ICD-10-CM

## 2019-10-27 NOTE — Patient Instructions (Addendum)
Mucinex DM Twice daily  As needed  Cough/congestion  Saline nasal rinses As needed   Begin Flonase (over the counter ) 2 puff daily  Change to Claritin to Allegra 180mg  daily .  Trelegy 1 puff daily , rinse after use.  Chest xray today .  Flu shot today .  Follow up with Dr. Valeta Harms 3-4 months with PFT and As needed   Please contact office for sooner follow up if symptoms do not improve or worsen or seek emergency care

## 2019-10-27 NOTE — Assessment & Plan Note (Signed)
Mild flare of symptoms with recent sinusitis.  Will change Claritin to Allegra.  And add Flonase  Plan  Patient Instructions  Mucinex DM Twice daily  As needed  Cough/congestion  Saline nasal rinses As needed   Begin Flonase (over the counter ) 2 puff daily  Change to Claritin to Allegra 180mg  daily .  Trelegy 1 puff daily , rinse after use.  Chest xray today .  Flu shot today .  Follow up with Dr. Valeta Harms 3-4 months with PFT and As needed   Please contact office for sooner follow up if symptoms do not improve or worsen or seek emergency care

## 2019-10-27 NOTE — Assessment & Plan Note (Signed)
Recent exacerbation now resolved Last chest x-rays in 2014 we will repeat chest x-ray today.  PFTs on return  Plan  Patient Instructions  Mucinex DM Twice daily  As needed  Cough/congestion  Saline nasal rinses As needed   Begin Flonase (over the counter ) 2 puff daily  Change to Claritin to Allegra 180mg  daily .  Trelegy 1 puff daily , rinse after use.  Chest xray today .  Flu shot today .  Follow up with Dr. Valeta Harms 3-4 months with PFT and As needed   Please contact office for sooner follow up if symptoms do not improve or worsen or seek emergency care

## 2019-10-27 NOTE — Progress Notes (Signed)
@Patient  ID: Tracy [name], female    DOB: 1951-04-04, 68 y.o.   MRN: [id number]  Chief Complaint  Patient presents with  . 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  Patient was seen via telemedicine last month for a COPD exacerbation with suspected sinusitis.  She was given a 10-day course of Augmentin and a prednisone taper.  Patient says she is feeling better cough congestion and sinus symptoms are decreased.  She continues to have shortness of breath with activity.  However feels that she is getting back to baseline.  She remains on Trelegy daily.  She says that her Claritin does not seem to be working as good.  She has not started Flonase as recommended.  She denies any hemoptysis chest pain orthopnea or edema.  Allergies  Allergen Reactions  . Codeine Swelling    Immunization History  Administered Date(s) Administered  . Fluad Quad(high Dose 65+) 10/27/2019  . Influenza Split 01/06/2013, 09/13/2013  . Influenza, High Dose Seasonal PF 10/10/2018  . Influenza,inj,Quad PF,6+ Mos 09/13/2014, 10/15/2015  . Influenza,inj,Quad PF,6-35 Mos 10/26/2016  . Pneumococcal Conjugate-13 07/12/2014  . Pneumococcal Polysaccharide-23 01/06/2013, 10/10/2018  . Tdap 05/15/2013  . Zoster 07/14/2013    Past Medical History:  Diagnosis Date  . Asthma   . COPD (chronic obstructive pulmonary disease) ([place]) 2011   FeV1 31% predicted FeV1/FVX 47 %  . Hemorrhoid   . Osteopenia   . Seasonal allergies    takes Claritin daily prn  . Vitamin D deficiency    takes Vit d every 14 days    Tobacco History: Social History   Tobacco Use  Smoking Status Former Smoker  . Packs/day: 0.50  . Years: 20.00  . Pack years: 10.00  . Types: Cigarettes  . Quit date: 09/14/1999   . Years since quitting: 20.1  Smokeless Tobacco Never Used   Counseling given: Not Answered   Outpatient Medications Prior to Visit  Medication Sig Dispense Refill  . albuterol (PROVENTIL) (2.5 MG/3ML) 0.083% nebulizer solution INHALE 3ML VIA NEBULIZER EVERY 4 HOURS AS NEEDED FOR SHORTNESS OF BREATH 120 mL 12  . Fluticasone-Umeclidin-Vilant (TRELEGY ELLIPTA) 100-62.5-25 MCG/INH AEPB Inhale 1 puff into the lungs daily. 60 each 3  . HYDROcodone-acetaminophen (NORCO) 5-325 MG tablet 1-2 tabs po q6 hours prn pain 20 tablet 0  . loratadine-pseudoephedrine (CLARITIN-D 24 HOUR) 10-240 MG 24 hr tablet Take 1 tablet by mouth daily. 30 tablet 6  . Tiotropium Bromide-Olodaterol (STIOLTO RESPIMAT) 2.5-2.5 MCG/ACT AERS Inhale 2 puffs into the lungs daily. 3 Inhaler 3  . predniSONE (DELTASONE) 10 MG tablet 4 tabs for 2 days, then 3 tabs for 2 days, 2 tabs for 2 days, then 1 tab for 2 days, then stop (Patient not taking: Reported on 10/27/2019) 20 tablet 0   No facility-administered medications prior to visit.      Review of Systems:   Constitutional:   No  weight loss, night sweats,  Fevers, chills, fatigue, or  lassitude.  HEENT:   No headaches,  Difficulty swallowing,  Tooth/dental problems, or  Sore throat,                No sneezing, itching, ear ache,  +nasal congestion, post nasal drip,   CV:  No  chest pain,  Orthopnea, PND, swelling in lower extremities, anasarca, dizziness, palpitations, syncope.   GI  No heartburn, indigestion, abdominal pain, nausea, vomiting, diarrhea, change in bowel habits, loss of appetite, bloody stools.   Resp:    No chest wall deformity  Skin: no rash or lesions.  GU: no dysuria, change in color of urine, no urgency or frequency.  No flank pain, no hematuria   MS:  No joint pain or swelling.  No decreased range of motion.  No back pain.    Physical Exam  BP 108/64 (BP Location: Left Arm, Cuff Size: Normal)   Pulse 83   Temp (!) 97.1 F (36.2 C)  (Temporal)   Ht 5\' 2"  (1.575 m)   Wt 129 lb (58.5 kg)   LMP 12/14/2000   SpO2 95%   BMI 23.59 kg/m   GEN: A/Ox3; pleasant , NAD, well nourished    HEENT:  [place]/AT,  NOSE-clear, THROAT-clear, no lesions, no postnasal drip or exudate noted.   NECK:  Supple w/ fair ROM; no JVD; normal carotid impulses w/o bruits; no thyromegaly or nodules palpated; no lymphadenopathy.    RESP  Clear  P & A; w/o, wheezes/ rales/ or rhonchi. no accessory muscle use, no dullness to percussion  CARD:  RRR, no m/r/g, no peripheral edema, pulses intact, no cyanosis or clubbing.  GI:   Soft & nt; nml bowel sounds; no organomegaly or masses detected.   Musco: Warm bil, no deformities or joint swelling noted.   Neuro: alert, no focal deficits noted.    Skin: Warm, no lesions or rashes    Lab Results:  CBC  BNP No results found for: BNP  ProBNP No results found for: PROBNP  Imaging: Dg Chest 2 View  Result Date: 10/27/2019 CLINICAL DATA:  COPD, asthma EXAM: CHEST - 2 VIEW COMPARISON:  12/16/2012 FINDINGS: The heart size and mediastinal contours are within normal limits. Lungs are lucent and hyperexpanded with flattening of the diaphragms. Coarsened interstitial markings bilaterally. No focal airspace consolidation. No pleural effusion or pneumothorax. The visualized skeletal structures are unremarkable. IMPRESSION: Stigmata of COPD.  No acute cardiopulmonary process. Electronically Signed   By: Davina Poke M.D.   On: 10/27/2019 11:52      No flowsheet data found.  No results found for: NITRICOXIDE      Assessment & Plan:   COPD with chronic bronchitis Recent exacerbation now resolved Last chest x-rays in 2014 we will repeat chest x-ray today.  PFTs on return  Plan  Patient Instructions  Mucinex DM Twice daily  As needed  Cough/congestion  Saline nasal rinses As needed   Begin Flonase (over the counter ) 2 puff daily  Change to Claritin to Allegra 180mg  daily .  Trelegy 1 puff  daily , rinse after use.  Chest xray today .  Flu shot today .  Follow up with Dr. Valeta Harms 3-4 months with PFT and As needed   Please contact office for sooner follow up if symptoms do not improve or worsen or seek emergency care       ALLERGIC RHINITIS, SEASONAL Mild flare of symptoms with recent sinusitis.  Will change Claritin to Allegra.  And add Flonase  Plan  Patient Instructions  Mucinex DM Twice daily  As needed  Cough/congestion  Saline nasal rinses As needed   Begin Flonase (over the counter ) 2 puff daily  Change to Claritin to Allegra 180mg  daily .  Trelegy 1 puff daily , rinse after use.  Chest  xray today .  Flu shot today .  Follow up with Dr. Valeta Harms 3-4 months with PFT and As needed   Please contact office for sooner follow up if symptoms do not improve or worsen or seek emergency care          Rexene Edison, NP 10/27/2019

## 2019-10-27 NOTE — Progress Notes (Signed)
PCCM: Agree. Thank you for seeing her.  [name], DO [facility] 10/27/2019 2:15 PM

## 2019-10-30 NOTE — Progress Notes (Signed)
Called spoke with patient, advised of cxr results / recs as stated by Parrett NP.  Pt verbalized understanding and denied any questions. 

## 2019-11-23 ENCOUNTER — Other Ambulatory Visit: Payer: Self-pay

## 2019-11-23 DIAGNOSIS — R69 Illness, unspecified: Secondary | ICD-10-CM | POA: Diagnosis not present

## 2019-11-23 MED ORDER — ALBUTEROL SULFATE (2.5 MG/3ML) 0.083% IN NEBU: INHALATION_SOLUTION | RESPIRATORY_TRACT | 12 refills | Status: DC

## 2019-12-15 DIAGNOSIS — J189 Pneumonia, unspecified organism: Secondary | ICD-10-CM

## 2019-12-15 DIAGNOSIS — T8143XA Infection following a procedure, organ and space surgical site, initial encounter: Secondary | ICD-10-CM

## 2019-12-15 HISTORY — DX: Pneumonia, unspecified organism: J18.9

## 2019-12-15 HISTORY — DX: Infection following a procedure, organ and space surgical site, initial encounter: T81.43XA

## 2019-12-20 DIAGNOSIS — R69 Illness, unspecified: Secondary | ICD-10-CM | POA: Diagnosis not present

## 2020-01-24 DIAGNOSIS — R69 Illness, unspecified: Secondary | ICD-10-CM | POA: Diagnosis not present

## 2020-02-07 ENCOUNTER — Ambulatory Visit: Payer: Medicare HMO | Admitting: Pulmonary Disease

## 2020-02-12 HISTORY — PX: US ECHOCARDIOGRAPHY: HXRAD669

## 2020-02-15 ENCOUNTER — Other Ambulatory Visit: Payer: Self-pay

## 2020-02-15 MED ORDER — TRELEGY ELLIPTA 100-62.5-25 MCG/INH IN AEPB: 1.0000 | INHALATION_SPRAY | Freq: Every day | RESPIRATORY_TRACT | 3 refills | Status: DC

## 2020-02-19 DIAGNOSIS — R69 Illness, unspecified: Secondary | ICD-10-CM | POA: Diagnosis not present

## 2020-02-20 DIAGNOSIS — R69 Illness, unspecified: Secondary | ICD-10-CM | POA: Diagnosis not present

## 2020-03-03 ENCOUNTER — Emergency Department (HOSPITAL_COMMUNITY): Payer: Medicare HMO | Admitting: Certified Registered Nurse Anesthetist

## 2020-03-03 ENCOUNTER — Encounter (HOSPITAL_COMMUNITY): Payer: Self-pay | Admitting: Emergency Medicine

## 2020-03-03 ENCOUNTER — Inpatient Hospital Stay (HOSPITAL_COMMUNITY)
Admission: EM | Admit: 2020-03-03 | Discharge: 2020-04-05 | DRG: 003 | Disposition: A | Discharge status: Rehabilitation Facility | Payer: Medicare HMO | Attending: General Surgery | Admitting: General Surgery

## 2020-03-03 ENCOUNTER — Emergency Department (HOSPITAL_COMMUNITY): Payer: Medicare HMO

## 2020-03-03 ENCOUNTER — Encounter (HOSPITAL_COMMUNITY): Admission: EM | Disposition: A | Discharge status: Rehabilitation Facility | Payer: Self-pay | Source: Home / Self Care

## 2020-03-03 ENCOUNTER — Other Ambulatory Visit: Payer: Self-pay

## 2020-03-03 DIAGNOSIS — J984 Other disorders of lung: Secondary | ICD-10-CM | POA: Diagnosis not present

## 2020-03-03 DIAGNOSIS — A419 Sepsis, unspecified organism: Secondary | ICD-10-CM | POA: Diagnosis not present

## 2020-03-03 DIAGNOSIS — K631 Perforation of intestine (nontraumatic): Secondary | ICD-10-CM | POA: Diagnosis not present

## 2020-03-03 DIAGNOSIS — C20 Malignant neoplasm of rectum: Secondary | ICD-10-CM | POA: Diagnosis present

## 2020-03-03 DIAGNOSIS — C187 Malignant neoplasm of sigmoid colon: Secondary | ICD-10-CM | POA: Diagnosis not present

## 2020-03-03 DIAGNOSIS — Z79899 Other long term (current) drug therapy: Secondary | ICD-10-CM

## 2020-03-03 DIAGNOSIS — R601 Generalized edema: Secondary | ICD-10-CM | POA: Diagnosis not present

## 2020-03-03 DIAGNOSIS — R579 Shock, unspecified: Secondary | ICD-10-CM | POA: Diagnosis not present

## 2020-03-03 DIAGNOSIS — I82A11 Acute embolism and thrombosis of right axillary vein: Secondary | ICD-10-CM | POA: Diagnosis not present

## 2020-03-03 DIAGNOSIS — E8770 Fluid overload, unspecified: Secondary | ICD-10-CM | POA: Diagnosis not present

## 2020-03-03 DIAGNOSIS — R0689 Other abnormalities of breathing: Secondary | ICD-10-CM

## 2020-03-03 DIAGNOSIS — R451 Restlessness and agitation: Secondary | ICD-10-CM | POA: Diagnosis not present

## 2020-03-03 DIAGNOSIS — R0902 Hypoxemia: Secondary | ICD-10-CM

## 2020-03-03 DIAGNOSIS — L02211 Cutaneous abscess of abdominal wall: Secondary | ICD-10-CM | POA: Diagnosis not present

## 2020-03-03 DIAGNOSIS — E872 Acidosis: Secondary | ICD-10-CM | POA: Diagnosis not present

## 2020-03-03 DIAGNOSIS — D6489 Other specified anemias: Secondary | ICD-10-CM | POA: Diagnosis present

## 2020-03-03 DIAGNOSIS — R198 Other specified symptoms and signs involving the digestive system and abdomen: Secondary | ICD-10-CM

## 2020-03-03 DIAGNOSIS — F419 Anxiety disorder, unspecified: Secondary | ICD-10-CM | POA: Diagnosis not present

## 2020-03-03 DIAGNOSIS — J441 Chronic obstructive pulmonary disease with (acute) exacerbation: Secondary | ICD-10-CM | POA: Diagnosis not present

## 2020-03-03 DIAGNOSIS — N179 Acute kidney failure, unspecified: Secondary | ICD-10-CM | POA: Diagnosis not present

## 2020-03-03 DIAGNOSIS — Z87891 Personal history of nicotine dependence: Secondary | ICD-10-CM

## 2020-03-03 DIAGNOSIS — E876 Hypokalemia: Secondary | ICD-10-CM | POA: Diagnosis not present

## 2020-03-03 DIAGNOSIS — Z0189 Encounter for other specified special examinations: Secondary | ICD-10-CM

## 2020-03-03 DIAGNOSIS — D72829 Elevated white blood cell count, unspecified: Secondary | ICD-10-CM

## 2020-03-03 DIAGNOSIS — K567 Ileus, unspecified: Secondary | ICD-10-CM | POA: Diagnosis not present

## 2020-03-03 DIAGNOSIS — Z4682 Encounter for fitting and adjustment of non-vascular catheter: Secondary | ICD-10-CM | POA: Diagnosis not present

## 2020-03-03 DIAGNOSIS — D62 Acute posthemorrhagic anemia: Secondary | ICD-10-CM | POA: Diagnosis not present

## 2020-03-03 DIAGNOSIS — E875 Hyperkalemia: Secondary | ICD-10-CM | POA: Diagnosis not present

## 2020-03-03 DIAGNOSIS — E559 Vitamin D deficiency, unspecified: Secondary | ICD-10-CM | POA: Diagnosis not present

## 2020-03-03 DIAGNOSIS — Z820 Family history of epilepsy and other diseases of the nervous system: Secondary | ICD-10-CM

## 2020-03-03 DIAGNOSIS — J9600 Acute respiratory failure, unspecified whether with hypoxia or hypercapnia: Secondary | ICD-10-CM | POA: Diagnosis not present

## 2020-03-03 DIAGNOSIS — E870 Hyperosmolality and hypernatremia: Secondary | ICD-10-CM | POA: Diagnosis not present

## 2020-03-03 DIAGNOSIS — Y95 Nosocomial condition: Secondary | ICD-10-CM | POA: Diagnosis not present

## 2020-03-03 DIAGNOSIS — J439 Emphysema, unspecified: Secondary | ICD-10-CM | POA: Diagnosis present

## 2020-03-03 DIAGNOSIS — D649 Anemia, unspecified: Secondary | ICD-10-CM | POA: Diagnosis not present

## 2020-03-03 DIAGNOSIS — K5190 Ulcerative colitis, unspecified, without complications: Secondary | ICD-10-CM | POA: Diagnosis not present

## 2020-03-03 DIAGNOSIS — R6521 Severe sepsis with septic shock: Secondary | ICD-10-CM | POA: Diagnosis not present

## 2020-03-03 DIAGNOSIS — J9602 Acute respiratory failure with hypercapnia: Secondary | ICD-10-CM | POA: Diagnosis not present

## 2020-03-03 DIAGNOSIS — Z978 Presence of other specified devices: Secondary | ICD-10-CM

## 2020-03-03 DIAGNOSIS — R339 Retention of urine, unspecified: Secondary | ICD-10-CM | POA: Diagnosis not present

## 2020-03-03 DIAGNOSIS — I82621 Acute embolism and thrombosis of deep veins of right upper extremity: Secondary | ICD-10-CM | POA: Diagnosis not present

## 2020-03-03 DIAGNOSIS — E440 Moderate protein-calorie malnutrition: Secondary | ICD-10-CM | POA: Diagnosis present

## 2020-03-03 DIAGNOSIS — J189 Pneumonia, unspecified organism: Secondary | ICD-10-CM | POA: Diagnosis not present

## 2020-03-03 DIAGNOSIS — R079 Chest pain, unspecified: Secondary | ICD-10-CM | POA: Diagnosis not present

## 2020-03-03 DIAGNOSIS — Z930 Tracheostomy status: Secondary | ICD-10-CM

## 2020-03-03 DIAGNOSIS — R0602 Shortness of breath: Secondary | ICD-10-CM

## 2020-03-03 DIAGNOSIS — Z66 Do not resuscitate: Secondary | ICD-10-CM | POA: Diagnosis not present

## 2020-03-03 DIAGNOSIS — J9601 Acute respiratory failure with hypoxia: Secondary | ICD-10-CM

## 2020-03-03 DIAGNOSIS — J9811 Atelectasis: Secondary | ICD-10-CM | POA: Diagnosis not present

## 2020-03-03 DIAGNOSIS — J9620 Acute and chronic respiratory failure, unspecified whether with hypoxia or hypercapnia: Secondary | ICD-10-CM | POA: Diagnosis not present

## 2020-03-03 DIAGNOSIS — Z8262 Family history of osteoporosis: Secondary | ICD-10-CM

## 2020-03-03 DIAGNOSIS — Z885 Allergy status to narcotic agent status: Secondary | ICD-10-CM

## 2020-03-03 DIAGNOSIS — Z20822 Contact with and (suspected) exposure to covid-19: Secondary | ICD-10-CM | POA: Diagnosis not present

## 2020-03-03 DIAGNOSIS — C19 Malignant neoplasm of rectosigmoid junction: Secondary | ICD-10-CM | POA: Diagnosis not present

## 2020-03-03 DIAGNOSIS — N170 Acute kidney failure with tubular necrosis: Secondary | ICD-10-CM | POA: Diagnosis not present

## 2020-03-03 DIAGNOSIS — K56609 Unspecified intestinal obstruction, unspecified as to partial versus complete obstruction: Secondary | ICD-10-CM | POA: Diagnosis not present

## 2020-03-03 DIAGNOSIS — Z03818 Encounter for observation for suspected exposure to other biological agents ruled out: Secondary | ICD-10-CM | POA: Diagnosis not present

## 2020-03-03 DIAGNOSIS — R609 Edema, unspecified: Secondary | ICD-10-CM | POA: Diagnosis not present

## 2020-03-03 DIAGNOSIS — Z825 Family history of asthma and other chronic lower respiratory diseases: Secondary | ICD-10-CM

## 2020-03-03 DIAGNOSIS — Z4659 Encounter for fitting and adjustment of other gastrointestinal appliance and device: Secondary | ICD-10-CM

## 2020-03-03 DIAGNOSIS — M8580 Other specified disorders of bone density and structure, unspecified site: Secondary | ICD-10-CM | POA: Diagnosis present

## 2020-03-03 DIAGNOSIS — J449 Chronic obstructive pulmonary disease, unspecified: Secondary | ICD-10-CM | POA: Diagnosis not present

## 2020-03-03 DIAGNOSIS — J9690 Respiratory failure, unspecified, unspecified whether with hypoxia or hypercapnia: Secondary | ICD-10-CM

## 2020-03-03 DIAGNOSIS — R918 Other nonspecific abnormal finding of lung field: Secondary | ICD-10-CM | POA: Diagnosis not present

## 2020-03-03 DIAGNOSIS — C218 Malignant neoplasm of overlapping sites of rectum, anus and anal canal: Secondary | ICD-10-CM | POA: Diagnosis not present

## 2020-03-03 DIAGNOSIS — N736 Female pelvic peritoneal adhesions (postinfective): Secondary | ICD-10-CM | POA: Diagnosis present

## 2020-03-03 DIAGNOSIS — T17908A Unspecified foreign body in respiratory tract, part unspecified causing other injury, initial encounter: Secondary | ICD-10-CM

## 2020-03-03 DIAGNOSIS — I10 Essential (primary) hypertension: Secondary | ICD-10-CM | POA: Diagnosis present

## 2020-03-03 DIAGNOSIS — R0603 Acute respiratory distress: Secondary | ICD-10-CM

## 2020-03-03 DIAGNOSIS — C189 Malignant neoplasm of colon, unspecified: Secondary | ICD-10-CM | POA: Diagnosis not present

## 2020-03-03 DIAGNOSIS — Z79891 Long term (current) use of opiate analgesic: Secondary | ICD-10-CM

## 2020-03-03 DIAGNOSIS — K658 Other peritonitis: Secondary | ICD-10-CM | POA: Diagnosis not present

## 2020-03-03 DIAGNOSIS — R109 Unspecified abdominal pain: Secondary | ICD-10-CM | POA: Diagnosis not present

## 2020-03-03 DIAGNOSIS — R188 Other ascites: Secondary | ICD-10-CM | POA: Diagnosis not present

## 2020-03-03 DIAGNOSIS — Z9911 Dependence on respirator [ventilator] status: Secondary | ICD-10-CM | POA: Diagnosis not present

## 2020-03-03 DIAGNOSIS — J9621 Acute and chronic respiratory failure with hypoxia: Secondary | ICD-10-CM | POA: Diagnosis not present

## 2020-03-03 DIAGNOSIS — Z433 Encounter for attention to colostomy: Secondary | ICD-10-CM

## 2020-03-03 DIAGNOSIS — I959 Hypotension, unspecified: Secondary | ICD-10-CM | POA: Diagnosis not present

## 2020-03-03 DIAGNOSIS — R739 Hyperglycemia, unspecified: Secondary | ICD-10-CM | POA: Diagnosis not present

## 2020-03-03 DIAGNOSIS — Z452 Encounter for adjustment and management of vascular access device: Secondary | ICD-10-CM | POA: Diagnosis not present

## 2020-03-03 DIAGNOSIS — Z85048 Personal history of other malignant neoplasm of rectum, rectosigmoid junction, and anus: Secondary | ICD-10-CM

## 2020-03-03 DIAGNOSIS — T8189XA Other complications of procedures, not elsewhere classified, initial encounter: Secondary | ICD-10-CM | POA: Diagnosis not present

## 2020-03-03 DIAGNOSIS — K651 Peritoneal abscess: Secondary | ICD-10-CM | POA: Diagnosis not present

## 2020-03-03 HISTORY — PX: LAPAROTOMY: SHX154

## 2020-03-03 HISTORY — DX: Perforation of intestine (nontraumatic): K63.1

## 2020-03-03 LAB — CBC WITH DIFFERENTIAL/PLATELET
Abs Immature Granulocytes: 0.05 10*3/uL (ref 0.00–0.07)
Basophils Absolute: 0.1 10*3/uL (ref 0.0–0.1)
Basophils Relative: 0 %
Eosinophils Absolute: 0 10*3/uL (ref 0.0–0.5)
Eosinophils Relative: 0 %
HCT: 46.8 % — ABNORMAL HIGH (ref 36.0–46.0)
Hemoglobin: 14.8 g/dL (ref 12.0–15.0)
Immature Granulocytes: 0 %
Lymphocytes Relative: 3 %
Lymphs Abs: 0.4 10*3/uL — ABNORMAL LOW (ref 0.7–4.0)
MCH: 32 pg (ref 26.0–34.0)
MCHC: 31.6 g/dL (ref 30.0–36.0)
MCV: 101.3 fL — ABNORMAL HIGH (ref 80.0–100.0)
Monocytes Absolute: 0.4 10*3/uL (ref 0.1–1.0)
Monocytes Relative: 2 %
Neutro Abs: 15.3 10*3/uL — ABNORMAL HIGH (ref 1.7–7.7)
Neutrophils Relative %: 95 %
Platelets: 322 10*3/uL (ref 150–400)
RBC: 4.62 MIL/uL (ref 3.87–5.11)
RDW: 12.2 % (ref 11.5–15.5)
WBC: 16.2 10*3/uL — ABNORMAL HIGH (ref 4.0–10.5)
nRBC: 0 % (ref 0.0–0.2)

## 2020-03-03 LAB — COMPREHENSIVE METABOLIC PANEL
ALT: 13 U/L (ref 0–44)
AST: 23 U/L (ref 15–41)
Albumin: 3.9 g/dL (ref 3.5–5.0)
Alkaline Phosphatase: 67 U/L (ref 38–126)
Anion gap: 15 (ref 5–15)
BUN: 34 mg/dL — ABNORMAL HIGH (ref 8–23)
CO2: 22 mmol/L (ref 22–32)
Calcium: 8.9 mg/dL (ref 8.9–10.3)
Chloride: 99 mmol/L (ref 98–111)
Creatinine, Ser: 1.43 mg/dL — ABNORMAL HIGH (ref 0.44–1.00)
GFR calc Af Amer: 44 mL/min — ABNORMAL LOW (ref >60)
GFR calc non Af Amer: 38 mL/min — ABNORMAL LOW (ref >60)
Glucose, Bld: 129 mg/dL — ABNORMAL HIGH (ref 70–99)
Potassium: 4.8 mmol/L (ref 3.5–5.1)
Sodium: 136 mmol/L (ref 135–145)
Total Bilirubin: 1.4 mg/dL — ABNORMAL HIGH (ref 0.3–1.2)
Total Protein: 7.1 g/dL (ref 6.5–8.1)

## 2020-03-03 LAB — RESPIRATORY PANEL BY RT PCR (FLU A&B, COVID)
Influenza A by PCR: NEGATIVE
Influenza B by PCR: NEGATIVE
SARS Coronavirus 2 by RT PCR: NEGATIVE

## 2020-03-03 LAB — LIPASE, BLOOD: Lipase: 18 U/L (ref 11–51)

## 2020-03-03 LAB — LACTIC ACID, PLASMA: Lactic Acid, Venous: 3.7 mmol/L (ref 0.5–1.9)

## 2020-03-03 LAB — GLUCOSE, CAPILLARY: Glucose-Capillary: 97 mg/dL (ref 70–99)

## 2020-03-03 LAB — MRSA PCR SCREENING: MRSA by PCR: NEGATIVE

## 2020-03-03 SURGERY — LAPAROTOMY, EXPLORATORY
Anesthesia: General | Site: Abdomen

## 2020-03-03 MED ORDER — GABAPENTIN 300 MG PO CAPS
300.0000 mg | ORAL_CAPSULE | Freq: Two times a day (BID) | ORAL | Status: DC
  Administered 2020-03-04 – 2020-03-05 (×3): 300 mg via ORAL
  Filled 2020-03-03 (×3): qty 1

## 2020-03-03 MED ORDER — LACTATED RINGERS IV SOLN: INTRAVENOUS | Status: DC | PRN

## 2020-03-03 MED ORDER — SODIUM CHLORIDE (PF) 0.9 % IJ SOLN
INTRAMUSCULAR | Status: AC
  Filled 2020-03-03: qty 50

## 2020-03-03 MED ORDER — ENOXAPARIN SODIUM 40 MG/0.4ML ~~LOC~~ SOLN
40.0000 mg | SUBCUTANEOUS | Status: DC
  Administered 2020-03-04: 40 mg via SUBCUTANEOUS
  Filled 2020-03-03: qty 0.4

## 2020-03-03 MED ORDER — UMECLIDINIUM BROMIDE 62.5 MCG/INH IN AEPB
1.0000 | INHALATION_SPRAY | Freq: Every day | RESPIRATORY_TRACT | Status: DC
  Administered 2020-03-04 – 2020-03-07 (×4): 1 via RESPIRATORY_TRACT
  Filled 2020-03-03: qty 7

## 2020-03-03 MED ORDER — 0.9 % SODIUM CHLORIDE (POUR BTL) OPTIME
TOPICAL | Status: DC | PRN
  Administered 2020-03-03: 6000 mL

## 2020-03-03 MED ORDER — ONDANSETRON HCL 4 MG/2ML IJ SOLN
4.0000 mg | Freq: Once | INTRAMUSCULAR | Status: AC
  Administered 2020-03-03: 4 mg via INTRAVENOUS
  Filled 2020-03-03: qty 2

## 2020-03-03 MED ORDER — OXYCODONE HCL 5 MG/5ML PO SOLN: 5.0000 mg | Freq: Once | ORAL | Status: DC | PRN

## 2020-03-03 MED ORDER — PROPOFOL 10 MG/ML IV BOLUS
INTRAVENOUS | Status: AC
  Filled 2020-03-03: qty 20

## 2020-03-03 MED ORDER — ROCURONIUM BROMIDE 50 MG/5ML IV SOSY
PREFILLED_SYRINGE | INTRAVENOUS | Status: DC | PRN
  Administered 2020-03-03: 10 mg via INTRAVENOUS
  Administered 2020-03-03: 50 mg via INTRAVENOUS

## 2020-03-03 MED ORDER — DIPHENHYDRAMINE HCL 50 MG/ML IJ SOLN
12.5000 mg | Freq: Four times a day (QID) | INTRAMUSCULAR | Status: DC | PRN
  Administered 2020-03-04: 12.5 mg via INTRAVENOUS
  Filled 2020-03-03: qty 1

## 2020-03-03 MED ORDER — FLUTICASONE-UMECLIDIN-VILANT 100-62.5-25 MCG/INH IN AEPB: 1.0000 | INHALATION_SPRAY | Freq: Every day | RESPIRATORY_TRACT | Status: DC

## 2020-03-03 MED ORDER — PHENYLEPHRINE 40 MCG/ML (10ML) SYRINGE FOR IV PUSH (FOR BLOOD PRESSURE SUPPORT)
PREFILLED_SYRINGE | INTRAVENOUS | Status: AC
  Filled 2020-03-03: qty 10

## 2020-03-03 MED ORDER — FENTANYL CITRATE (PF) 250 MCG/5ML IJ SOLN
INTRAMUSCULAR | Status: AC
  Filled 2020-03-03: qty 5

## 2020-03-03 MED ORDER — LACTATED RINGERS IV BOLUS
1000.0000 mL | Freq: Once | INTRAVENOUS | Status: AC
  Administered 2020-03-03: 1000 mL via INTRAVENOUS

## 2020-03-03 MED ORDER — ONDANSETRON HCL 4 MG/2ML IJ SOLN
INTRAMUSCULAR | Status: DC | PRN
  Administered 2020-03-03: 4 mg via INTRAVENOUS

## 2020-03-03 MED ORDER — IOHEXOL 300 MG/ML  SOLN
80.0000 mL | Freq: Once | INTRAMUSCULAR | Status: AC | PRN
  Administered 2020-03-03: 80 mL via INTRAVENOUS

## 2020-03-03 MED ORDER — PIPERACILLIN-TAZOBACTAM 3.375 G IVPB
3.3750 g | Freq: Three times a day (TID) | INTRAVENOUS | Status: DC
  Administered 2020-03-04 – 2020-03-05 (×2): 3.375 g via INTRAVENOUS
  Filled 2020-03-03 (×3): qty 50

## 2020-03-03 MED ORDER — ONDANSETRON HCL 4 MG PO TABS: 4.0000 mg | ORAL_TABLET | Freq: Four times a day (QID) | ORAL | Status: DC | PRN

## 2020-03-03 MED ORDER — ALBUTEROL SULFATE (2.5 MG/3ML) 0.083% IN NEBU
2.5000 mg | INHALATION_SOLUTION | Freq: Four times a day (QID) | RESPIRATORY_TRACT | Status: DC | PRN
  Administered 2020-03-05 – 2020-03-09 (×5): 2.5 mg via RESPIRATORY_TRACT
  Filled 2020-03-03 (×5): qty 3

## 2020-03-03 MED ORDER — SODIUM CHLORIDE 0.9 % IV BOLUS
1000.0000 mL | Freq: Once | INTRAVENOUS | Status: AC
  Administered 2020-03-03: 14:00:00 1000 mL via INTRAVENOUS

## 2020-03-03 MED ORDER — SODIUM CHLORIDE 0.9 % IV BOLUS
1000.0000 mL | Freq: Once | INTRAVENOUS | Status: AC
  Administered 2020-03-03: 1000 mL via INTRAVENOUS

## 2020-03-03 MED ORDER — SACCHAROMYCES BOULARDII 250 MG PO CAPS
250.0000 mg | ORAL_CAPSULE | Freq: Two times a day (BID) | ORAL | Status: DC
  Administered 2020-03-04 – 2020-03-12 (×12): 250 mg via ORAL
  Filled 2020-03-03 (×12): qty 1

## 2020-03-03 MED ORDER — SUCCINYLCHOLINE CHLORIDE 200 MG/10ML IV SOSY
PREFILLED_SYRINGE | INTRAVENOUS | Status: DC | PRN
  Administered 2020-03-03: 80 mg via INTRAVENOUS

## 2020-03-03 MED ORDER — PROPOFOL 10 MG/ML IV BOLUS
INTRAVENOUS | Status: DC | PRN
  Administered 2020-03-03: 120 mg via INTRAVENOUS

## 2020-03-03 MED ORDER — ROCURONIUM BROMIDE 10 MG/ML (PF) SYRINGE
PREFILLED_SYRINGE | INTRAVENOUS | Status: AC
  Filled 2020-03-03: qty 10

## 2020-03-03 MED ORDER — LIDOCAINE 2% (20 MG/ML) 5 ML SYRINGE
INTRAMUSCULAR | Status: DC | PRN
  Administered 2020-03-03: 60 mg via INTRAVENOUS

## 2020-03-03 MED ORDER — ONDANSETRON HCL 4 MG/2ML IJ SOLN
4.0000 mg | Freq: Four times a day (QID) | INTRAMUSCULAR | Status: DC | PRN
  Administered 2020-03-07 – 2020-03-08 (×4): 4 mg via INTRAVENOUS
  Filled 2020-03-03 (×4): qty 2

## 2020-03-03 MED ORDER — METOPROLOL TARTRATE 5 MG/5ML IV SOLN: 5.0000 mg | Freq: Four times a day (QID) | INTRAVENOUS | Status: DC | PRN

## 2020-03-03 MED ORDER — PHENYLEPHRINE HCL (PRESSORS) 10 MG/ML IV SOLN
INTRAVENOUS | Status: AC
  Filled 2020-03-03: qty 1

## 2020-03-03 MED ORDER — PHENYLEPHRINE 40 MCG/ML (10ML) SYRINGE FOR IV PUSH (FOR BLOOD PRESSURE SUPPORT)
PREFILLED_SYRINGE | INTRAVENOUS | Status: DC | PRN
  Administered 2020-03-03: 120 ug via INTRAVENOUS

## 2020-03-03 MED ORDER — ALBUMIN HUMAN 5 % IV SOLN
INTRAVENOUS | Status: AC
  Filled 2020-03-03: qty 250

## 2020-03-03 MED ORDER — SUCCINYLCHOLINE CHLORIDE 200 MG/10ML IV SOSY
PREFILLED_SYRINGE | INTRAVENOUS | Status: AC
  Filled 2020-03-03: qty 10

## 2020-03-03 MED ORDER — LIDOCAINE 2% (20 MG/ML) 5 ML SYRINGE
INTRAMUSCULAR | Status: AC
  Filled 2020-03-03: qty 5

## 2020-03-03 MED ORDER — FENTANYL CITRATE (PF) 100 MCG/2ML IJ SOLN
INTRAMUSCULAR | Status: AC
  Filled 2020-03-03: qty 2

## 2020-03-03 MED ORDER — HYDROMORPHONE HCL 1 MG/ML IJ SOLN
INTRAMUSCULAR | Status: AC
  Filled 2020-03-03: qty 1

## 2020-03-03 MED ORDER — LACTATED RINGERS IV SOLN: INTRAVENOUS | Status: DC

## 2020-03-03 MED ORDER — HYDROMORPHONE HCL 1 MG/ML IJ SOLN
0.5000 mg | INTRAMUSCULAR | Status: DC | PRN
  Administered 2020-03-04 – 2020-03-08 (×12): 0.5 mg via INTRAVENOUS
  Filled 2020-03-03: qty 1
  Filled 2020-03-03: qty 0.5
  Filled 2020-03-03 (×10): qty 1

## 2020-03-03 MED ORDER — MOMETASONE FURO-FORMOTEROL FUM 100-5 MCG/ACT IN AERO
2.0000 | INHALATION_SPRAY | Freq: Two times a day (BID) | RESPIRATORY_TRACT | Status: DC
  Administered 2020-03-03 – 2020-03-07 (×9): 2 via RESPIRATORY_TRACT
  Filled 2020-03-03: qty 8.8

## 2020-03-03 MED ORDER — ALBUMIN HUMAN 5 % IV SOLN: INTRAVENOUS | Status: DC | PRN

## 2020-03-03 MED ORDER — PROMETHAZINE HCL 25 MG/ML IJ SOLN: 6.2500 mg | INTRAMUSCULAR | Status: DC | PRN

## 2020-03-03 MED ORDER — OXYCODONE HCL 5 MG PO TABS: 5.0000 mg | ORAL_TABLET | Freq: Once | ORAL | Status: DC | PRN

## 2020-03-03 MED ORDER — ALUM & MAG HYDROXIDE-SIMETH 200-200-20 MG/5ML PO SUSP: 30.0000 mL | Freq: Four times a day (QID) | ORAL | Status: DC | PRN

## 2020-03-03 MED ORDER — SODIUM CHLORIDE 0.9 % IV SOLN
2.0000 g | INTRAVENOUS | Status: AC
  Administered 2020-03-03: 2 g via INTRAVENOUS
  Filled 2020-03-03: qty 2

## 2020-03-03 MED ORDER — PHENYLEPHRINE HCL-NACL 10-0.9 MG/250ML-% IV SOLN
INTRAVENOUS | Status: DC | PRN
  Administered 2020-03-03: 80 ug/min via INTRAVENOUS

## 2020-03-03 MED ORDER — HYDROMORPHONE HCL 1 MG/ML IJ SOLN
0.2500 mg | INTRAMUSCULAR | Status: DC | PRN
  Administered 2020-03-03 (×4): 0.5 mg via INTRAVENOUS

## 2020-03-03 MED ORDER — DEXAMETHASONE SODIUM PHOSPHATE 10 MG/ML IJ SOLN
INTRAMUSCULAR | Status: AC
  Filled 2020-03-03: qty 1

## 2020-03-03 MED ORDER — PIPERACILLIN-TAZOBACTAM 3.375 G IVPB
3.3750 g | Freq: Once | INTRAVENOUS | Status: AC
  Administered 2020-03-03: 3.375 g via INTRAVENOUS
  Filled 2020-03-03: qty 50

## 2020-03-03 MED ORDER — FENTANYL CITRATE (PF) 100 MCG/2ML IJ SOLN
100.0000 ug | Freq: Once | INTRAMUSCULAR | Status: AC
  Administered 2020-03-03: 100 ug via INTRAVENOUS
  Filled 2020-03-03: qty 2

## 2020-03-03 MED ORDER — ONDANSETRON HCL 4 MG/2ML IJ SOLN
INTRAMUSCULAR | Status: AC
  Filled 2020-03-03: qty 2

## 2020-03-03 MED ORDER — FENTANYL CITRATE (PF) 100 MCG/2ML IJ SOLN
INTRAMUSCULAR | Status: DC | PRN
  Administered 2020-03-03 (×6): 50 ug via INTRAVENOUS

## 2020-03-03 MED ORDER — ALVIMOPAN 12 MG PO CAPS: 12.0000 mg | ORAL_CAPSULE | Freq: Two times a day (BID) | ORAL | Status: DC

## 2020-03-03 MED ORDER — DIPHENHYDRAMINE HCL 12.5 MG/5ML PO ELIX: 12.5000 mg | ORAL_SOLUTION | Freq: Four times a day (QID) | ORAL | Status: DC | PRN

## 2020-03-03 MED ORDER — SUGAMMADEX SODIUM 200 MG/2ML IV SOLN
INTRAVENOUS | Status: DC | PRN
  Administered 2020-03-03: 150 mg via INTRAVENOUS

## 2020-03-03 MED ORDER — DEXAMETHASONE SODIUM PHOSPHATE 10 MG/ML IJ SOLN
INTRAMUSCULAR | Status: DC | PRN
  Administered 2020-03-03: 4 mg via INTRAVENOUS

## 2020-03-03 SURGICAL SUPPLY — 51 items
APL PRP STRL LF DISP 70% ISPRP (MISCELLANEOUS) ×1
BLADE EXTENDED COATED 6.5IN (ELECTRODE) ×1 IMPLANT
BRR ADH 5X3 SEPRAFILM 6 SHT (MISCELLANEOUS) ×1
CELLS DAT CNTRL 66122 CELL SVR (MISCELLANEOUS) IMPLANT
CHLORAPREP W/TINT 26 (MISCELLANEOUS) ×2 IMPLANT
COVER WAND RF STERILE (DRAPES) IMPLANT
DRAIN CHANNEL 19F RND (DRAIN) ×1 IMPLANT
DRAPE LAPAROSCOPIC ABDOMINAL (DRAPES) ×2 IMPLANT
DRAPE SHEET LG 3/4 BI-LAMINATE (DRAPES) ×1 IMPLANT
DRSG OPSITE POSTOP 4X10 (GAUZE/BANDAGES/DRESSINGS) IMPLANT
DRSG OPSITE POSTOP 4X6 (GAUZE/BANDAGES/DRESSINGS) IMPLANT
DRSG OPSITE POSTOP 4X8 (GAUZE/BANDAGES/DRESSINGS) IMPLANT
DRSG PAD ABDOMINAL 8X10 ST (GAUZE/BANDAGES/DRESSINGS) ×2 IMPLANT
ELECT REM PT RETURN 15FT ADLT (MISCELLANEOUS) ×2 IMPLANT
EVACUATOR SILICONE 100CC (DRAIN) ×1 IMPLANT
GAUZE SPONGE 4X4 12PLY STRL (GAUZE/BANDAGES/DRESSINGS) IMPLANT
GLOVE BIO SURGEON STRL SZ 6.5 (GLOVE) ×4 IMPLANT
GLOVE BIOGEL PI IND STRL 7.0 (GLOVE) ×2 IMPLANT
GLOVE BIOGEL PI INDICATOR 7.0 (GLOVE) ×2
GOWN STRL REUS W/TWL XL LVL3 (GOWN DISPOSABLE) ×6 IMPLANT
HANDLE SUCTION POOLE (INSTRUMENTS) IMPLANT
KIT TURNOVER KIT A (KITS) IMPLANT
LEGGING LITHOTOMY PAIR STRL (DRAPES) IMPLANT
LIGASURE IMPACT 36 18CM CVD LR (INSTRUMENTS) ×1 IMPLANT
PACK COLON (CUSTOM PROCEDURE TRAY) ×1 IMPLANT
PENCIL SMOKE EVACUATOR (MISCELLANEOUS) ×1 IMPLANT
RETRACTOR WND ALEXIS 18 MED (MISCELLANEOUS) IMPLANT
RETRACTOR WND ALEXIS 25 LRG (MISCELLANEOUS) IMPLANT
RTRCTR WOUND ALEXIS 18CM MED (MISCELLANEOUS)
RTRCTR WOUND ALEXIS 25CM LRG (MISCELLANEOUS) ×2
SEPRAFILM PROCEDURAL PACK 3X5 (MISCELLANEOUS) ×1 IMPLANT
SLEEVE SUCTION CATH 165 (SLEEVE) ×1 IMPLANT
STAPLER CUT CVD 40MM BLUE (STAPLE) ×2 IMPLANT
STAPLER CUT RELOAD BLUE (STAPLE) ×1 IMPLANT
STAPLER VISISTAT 35W (STAPLE) ×1 IMPLANT
SUCTION POOLE HANDLE (INSTRUMENTS) ×2
SUT ETHILON 3 0 PS 1 (SUTURE) ×1 IMPLANT
SUT NOVA 1 T20/GS 25DT (SUTURE) ×4 IMPLANT
SUT PDS AB 1 CTX 36 (SUTURE) IMPLANT
SUT PROLENE 2 0 SH DA (SUTURE) ×2 IMPLANT
SUT SILK 2 0 (SUTURE) ×2
SUT SILK 2 0 SH CR/8 (SUTURE) ×2 IMPLANT
SUT SILK 2-0 18XBRD TIE 12 (SUTURE) ×1 IMPLANT
SUT SILK 3 0 (SUTURE) ×2
SUT SILK 3 0 SH CR/8 (SUTURE) ×2 IMPLANT
SUT SILK 3-0 18XBRD TIE 12 (SUTURE) ×1 IMPLANT
SUT VIC AB 2-0 SH 18 (SUTURE) ×2 IMPLANT
TOWEL OR 17X26 10 PK STRL BLUE (TOWEL DISPOSABLE) ×2 IMPLANT
TOWEL OR NON WOVEN STRL DISP B (DISPOSABLE) ×2 IMPLANT
TRAY FOLEY MTR SLVR 16FR STAT (SET/KITS/TRAYS/PACK) ×2 IMPLANT
TUBING CONNECTING 10 (TUBING) ×4 IMPLANT

## 2020-03-03 NOTE — Progress Notes (Signed)
Pharmacy Brief Note - Alvimopan (Entereg)  Pre-op Entereg dose was not ordered therefore post-op order has been discontinued per hospital policy.   Thank you- Netta Cedars, [facility] 03/03/2020 8:48 PM

## 2020-03-03 NOTE — Anesthesia Procedure Notes (Signed)
Procedure Name: Intubation Date/Time: 03/03/2020 4:36 PM Performed by: Montel Clock, CRNA Pre-anesthesia Checklist: Patient identified, Emergency Drugs available, Suction available, Patient being monitored and Timeout performed Patient Re-evaluated:Patient Re-evaluated prior to induction Oxygen Delivery Method: Circle system utilized Preoxygenation: Pre-oxygenation with 100% oxygen Induction Type: IV induction and Rapid sequence Laryngoscope Size: Mac and 3 Grade View: Grade I Tube type: Oral Tube size: 7.0 mm Number of attempts: 1 Airway Equipment and Method: Stylet Placement Confirmation: ETT inserted through vocal cords under direct vision,  positive ETCO2 and breath sounds checked- equal and bilateral Secured at: 21 cm Tube secured with: Tape Dental Injury: Teeth and Oropharynx as per pre-operative assessment

## 2020-03-03 NOTE — ED Triage Notes (Signed)
Patient here from home with complaints of "severe" abd pain with nausea since yesterday. Loss of appetite. "I think I have a blockage".

## 2020-03-03 NOTE — ED Notes (Signed)
Pt belongings given to husband. Pt being transported to OR to isolation room per OR instruction. Informed consent paperwork at bedside.   Pt confirms last PO liquid 0800 this morning, Last food consumed last night.

## 2020-03-03 NOTE — ED Notes (Signed)
Pt. Stated she was unable to urinate at this time. Will collect urine specimen when pt. Voids. Nurse aware.

## 2020-03-03 NOTE — ED Notes (Signed)
Pt transported to CT scan.

## 2020-03-03 NOTE — H&P (Signed)
CC: abd pain  Requesting provider: Dr Zenia Resides  HPI: Tracy [name] is an 69 y.o. female who is here for abd pain that started yesterday.  It is associated with nausea.  She states she has had diarrhea for several weeks.  She has an apt with her doctor to discuss further later this month.   Past Medical History:  Diagnosis Date  . Asthma   . COPD (chronic obstructive pulmonary disease) ([place]) 2011   FeV1 31% predicted FeV1/FVX 47 %  . Hemorrhoid   . Osteopenia   . Seasonal allergies    takes Claritin daily prn  . Vitamin D deficiency    takes Vit d every 14 days    Past Surgical History:  Procedure Laterality Date  . AUGMENTATION MAMMAPLASTY     saline  . BREAST ENHANCEMENT SURGERY    . EXAMINATION UNDER ANESTHESIA  10/14/2012   Procedure: EXAM UNDER ANESTHESIA;  Surgeon: Gayland Curry, MD,FACS;  Location: [facility];  Service: General;  Laterality: N/A;  rectal exam under anesthesia excisional hemorrhoidectomy hemorrhoidal banding x two  . EXAMINATION UNDER ANESTHESIA  02/03/2013   excision hemorrhoidal tissue  . FOOT SURGERY     left bunionectomy  . HEMORRHOIDECTOMY WITH HEMORRHOID BANDING  10/14/2012   Procedure: HEMORRHOIDECTOMY WITH HEMORRHOID BANDING;  Surgeon: Gayland Curry, MD,FACS;  Location: [facility];  Service: General;  Laterality: N/A;  . OPEN REDUCTION INTERNAL FIXATION (ORIF) DISTAL RADIAL FRACTURE Right 11/01/2018   Procedure: OPEN REDUCTION INTERNAL FIXATION (ORIF) DISTAL RADIAL FRACTURE;  Surgeon: [name], MD;  Location: [facility];  Service: Orthopedics;  Laterality: Right;  . TONSILLECTOMY  age 26    recurrent otitis media    Family History  Problem Relation Age of Onset  . Osteoporosis Mother   . Other Father        [place] in snow, broke ankle, blood clot to lungs  . Heart murmur Sister   . Other Brother        h/o being hit by lightening  . Parkinsonism Brother   . Asthma Son   . Allergies Son   . Otitis media Son   . Other Sister         CML  . Asthma Son   . Allergies Son   . Pancreatic cancer Brother        diag December 27, 2013-deceased Feb 26, 2014    Social:  reports that she quit smoking about 20 years ago. Her smoking use included cigarettes. She has a 10.00 pack-year smoking history. She has never used smokeless tobacco. She reports current alcohol use. She reports that she does not use drugs.  Allergies:  Allergies  Allergen Reactions  . Codeine Swelling    Medications: Current Outpatient Medications  Medication Instructions  . acetaminophen (TYLENOL) 650 mg, Oral, Every 6 hours PRN  . albuterol (PROVENTIL) (2.5 MG/3ML) 0.083% nebulizer solution INHALE 3ML VIA NEBULIZER EVERY 4 HOURS AS NEEDED FOR SHORTNESS OF BREATH  . b complex vitamins tablet 1 tablet, Oral, Daily  . Fluticasone-Umeclidin-Vilant (TRELEGY ELLIPTA) 100-62.5-25 MCG/INH AEPB 1 puff, Inhalation, Daily  . HYDROcodone-acetaminophen (NORCO) 5-325 MG tablet 1-2 tabs po q6 hours prn pain  . loratadine-pseudoephedrine (CLARITIN-D 24 HOUR) 10-240 MG 24 hr tablet 1 tablet, Oral, Daily  . Tiotropium Bromide-Olodaterol (STIOLTO RESPIMAT) 2.5-2.5 MCG/ACT AERS 2 puffs, Inhalation, Daily  . VITAMIN D, CHOLECALCIFEROL, PO 1 tablet, Oral, Daily    Results for orders placed or performed during the hospital encounter of 03/03/20 (from the past 48 hour(s))  CBC with Differential/Platelet     Status: Abnormal (Preliminary result)   Collection Time: 03/03/20 12:14 PM  Result Value Ref Range   WBC PENDING 4.0 - 10.5 K/uL   RBC 4.62 3.87 - 5.11 MIL/uL   Hemoglobin 14.8 12.0 - 15.0 g/dL   HCT 46.8 (H) 36.0 - 46.0 %   MCV 101.3 (H) 80.0 - 100.0 fL   MCH 32.0 26.0 - 34.0 pg   MCHC 31.6 30.0 - 36.0 g/dL   RDW 12.2 11.5 - 15.5 %   Platelets 322 150 - 400 K/uL    Comment: Performed at [facility], [address] [place], [place] [address]   nRBC PENDING 0.0 - 0.2 %   Neutrophils Relative % PENDING %   Neutro Abs PENDING 1.7 - 7.7 K/uL   Band  Neutrophils PENDING %   Lymphocytes Relative PENDING %   Lymphs Abs PENDING 0.7 - 4.0 K/uL   Monocytes Relative PENDING %   Monocytes Absolute PENDING 0.1 - 1.0 K/uL   Eosinophils Relative PENDING %   Eosinophils Absolute PENDING 0.0 - 0.5 K/uL   Basophils Relative PENDING %   Basophils Absolute PENDING 0.0 - 0.1 K/uL   WBC Morphology PENDING    RBC Morphology PENDING    Smear Review PENDING    Other PENDING %   nRBC PENDING 0 /100 WBC   Metamyelocytes Relative PENDING %   Myelocytes PENDING %   Promyelocytes Relative PENDING %   Blasts PENDING %   Immature Granulocytes PENDING %   Abs Immature Granulocytes PENDING 0.00 - 0.07 K/uL  Comprehensive metabolic panel     Status: Abnormal   Collection Time: 03/03/20 12:14 PM  Result Value Ref Range   Sodium 136 135 - 145 mmol/L   Potassium 4.8 3.5 - 5.1 mmol/L   Chloride 99 98 - 111 mmol/L   CO2 22 22 - 32 mmol/L   Glucose, Bld 129 (H) 70 - 99 mg/dL    Comment: Glucose reference range applies only to samples taken after fasting for at least 8 hours.   BUN 34 (H) 8 - 23 mg/dL   Creatinine, Ser 1.43 (H) 0.44 - 1.00 mg/dL   Calcium 8.9 8.9 - 10.3 mg/dL   Total Protein 7.1 6.5 - 8.1 g/dL   Albumin 3.9 3.5 - 5.0 g/dL   AST 23 15 - 41 U/L   ALT 13 0 - 44 U/L   Alkaline Phosphatase 67 38 - 126 U/L   Total Bilirubin 1.4 (H) 0.3 - 1.2 mg/dL   GFR calc non Af Amer 38 (L) >60 mL/min   GFR calc Af Amer 44 (L) >60 mL/min   Anion gap 15 5 - 15    Comment: Performed at [facility], [place] [address] [place], [place] [address]  Lipase, blood     Status: None   Collection Time: 03/03/20 12:14 PM  Result Value Ref Range   Lipase 18 11 - 51 U/L    Comment: Performed at [facility], [place] [address] [place], [place] [address]  Lactic acid, plasma     Status: Abnormal   Collection Time: 03/03/20 12:15 PM  Result Value Ref Range   Lactic Acid, Venous 3.7 (HH) 0.5 - 1.9 mmol/L    Comment: CRITICAL RESULT  CALLED TO, READ BACK BY AND VERIFIED WITH: MOONEY,J RN @1325  ON 03/03/2020 JACKSON,K Performed at [facility], [place] [address], [place], [place] [address]     No results  found.  ROS - all of the below systems have been reviewed with the patient and positives are indicated with bold text General: no chills, fever or night sweats Eyes: no blurry vision or double vision ENT:no epistaxis or sore throat Allergy/Immunology: no itchy/watery eyes or nasal congestion Hematologic/Lymphatic: no bleeding problems, blood clots or swollen lymph nodes Endocrine:no temperature intolerance or unexpected weight changes Breast: no new or changing breast lumps or nipple discharge Resp: no cough, shortness of breath, or wheezing CV: no chest pain or dyspnea on exertion GI: as per HPI GU: no dysuria, trouble voiding, or hematuria MSK: no joint pain or joint stiffness Neuro: no TIA or stroke symptoms Derm: no pruritus and skin lesion changes Psych: no anxiety and depression  PE Blood pressure (!) 107/95, pulse 93, temperature 98 F (36.7 C), temperature source Oral, resp. rate 18, last menstrual period 12/14/2000, SpO2 98 %. Constitutional: NAD; conversant; no deformities Eyes: Moist conjunctiva; no lid lag; anicteric; PERRL Neck: Trachea midline; no thyromegaly Lungs: Normal respiratory effort; no tactile fremitus CV: RRR; no palpable thrills; no pitting edema GI: Abd rigid; Positive for diffuse peritonitis  MSK: Normal range of motion of extremities; no clubbing/cyanosis Psychiatric: Appropriate affect; alert and oriented x3 Lymphatic: No palpable cervical or axillary lymphadenopathy  Results for orders placed or performed during the hospital encounter of 03/03/20 (from the past 48 hour(s))  CBC with Differential/Platelet     Status: Abnormal (Preliminary result)   Collection Time: 03/03/20 12:14 PM  Result Value Ref Range   WBC PENDING 4.0 - 10.5 K/uL   RBC 4.62 3.87 - 5.11  MIL/uL   Hemoglobin 14.8 12.0 - 15.0 g/dL   HCT 46.8 (H) 36.0 - 46.0 %   MCV 101.3 (H) 80.0 - 100.0 fL   MCH 32.0 26.0 - 34.0 pg   MCHC 31.6 30.0 - 36.0 g/dL   RDW 12.2 11.5 - 15.5 %   Platelets 322 150 - 400 K/uL    Comment: Performed at [facility], [address] [place], [place] [address]   nRBC PENDING 0.0 - 0.2 %   Neutrophils Relative % PENDING %   Neutro Abs PENDING 1.7 - 7.7 K/uL   Band Neutrophils PENDING %   Lymphocytes Relative PENDING %   Lymphs Abs PENDING 0.7 - 4.0 K/uL   Monocytes Relative PENDING %   Monocytes Absolute PENDING 0.1 - 1.0 K/uL   Eosinophils Relative PENDING %   Eosinophils Absolute PENDING 0.0 - 0.5 K/uL   Basophils Relative PENDING %   Basophils Absolute PENDING 0.0 - 0.1 K/uL   WBC Morphology PENDING    RBC Morphology PENDING    Smear Review PENDING    Other PENDING %   nRBC PENDING 0 /100 WBC   Metamyelocytes Relative PENDING %   Myelocytes PENDING %   Promyelocytes Relative PENDING %   Blasts PENDING %   Immature Granulocytes PENDING %   Abs Immature Granulocytes PENDING 0.00 - 0.07 K/uL  Comprehensive metabolic panel     Status: Abnormal   Collection Time: 03/03/20 12:14 PM  Result Value Ref Range   Sodium 136 135 - 145 mmol/L   Potassium 4.8 3.5 - 5.1 mmol/L   Chloride 99 98 - 111 mmol/L   CO2 22 22 - 32 mmol/L   Glucose, Bld 129 (H) 70 - 99 mg/dL    Comment: Glucose reference range applies only to samples taken after fasting for at least 8 hours.   BUN 34 (H) 8 - 23  mg/dL   Creatinine, Ser 1.43 (H) 0.44 - 1.00 mg/dL   Calcium 8.9 8.9 - 10.3 mg/dL   Total Protein 7.1 6.5 - 8.1 g/dL   Albumin 3.9 3.5 - 5.0 g/dL   AST 23 15 - 41 U/L   ALT 13 0 - 44 U/L   Alkaline Phosphatase 67 38 - 126 U/L   Total Bilirubin 1.4 (H) 0.3 - 1.2 mg/dL   GFR calc non Af Amer 38 (L) >60 mL/min   GFR calc Af Amer 44 (L) >60 mL/min   Anion gap 15 5 - 15    Comment: Performed at [facility], [place] [address]  [place], [place] [address]  Lipase, blood     Status: None   Collection Time: 03/03/20 12:14 PM  Result Value Ref Range   Lipase 18 11 - 51 U/L    Comment: Performed at [facility], [place] [address] [place], [place] [address]  Lactic acid, plasma     Status: Abnormal   Collection Time: 03/03/20 12:15 PM  Result Value Ref Range   Lactic Acid, Venous 3.7 (HH) 0.5 - 1.9 mmol/L    Comment: CRITICAL RESULT CALLED TO, READ BACK BY AND VERIFIED WITH: MOONEY,J RN @1325  ON 03/03/2020 JACKSON,K Performed at [facility], [place] [address], [place], [place] [address]     No results found.   A/P: Tracy [name] is an 69 y.o. female with an acute abdomen.  She has a rigid abdomen.  CT shows possible splenic flexure perforation.  I have recommended an emergent exploratory laparotomy.  We discussed the possible need for a colostomy.  We discussed other risks which include bleeding, infection, damage to adjacent structures, possible wound complications such as hernia and infection, leak of surgical connections, which can lead to other surgeries and possibly an ostomy, possible need for other procedures, such as abscess drains in radiology, possible prolonged hospital stay, possible diarrhea from removal of part of the colon, possible constipation from narcotics, prolonged fatigue/weakness or appetite loss, possible early recurrence of of disease, possible complications of their medical problems such as heart disease or arrhythmias or lung problems, death (less than 1%). I believe the patient understands and wishes to proceed with the surgery.    Rosario Adie, MD  [facility]

## 2020-03-03 NOTE — ED Provider Notes (Signed)
Medical screening examination/treatment/procedure(s) were conducted as a shared visit with non-physician practitioner(s) and myself.  I personally evaluated the patient during the encounter.    69 year old female who presents with severe abdominal pain with nausea since yesterday.  Has elevated lactate here and abdominal CT shows reassuring abdomen.  IV antibiotics started.  Given fluids and stational with antibiotics and discussed with Dr. Marcello Moores from general surgery and patient to be admitted   Lacretia Leigh, MD 03/03/20 1407

## 2020-03-03 NOTE — Op Note (Signed)
03/03/2020  6:45 PM  PATIENT:  Tracy [name]  69 y.o. female  Patient Care Team: [name], DO as PCP - General (Pulmonary Disease) Elsie Stain, MD (Pulmonary Disease)  PRE-OPERATIVE DIAGNOSIS:  bowel perforation, perforated viscus  POST-OPERATIVE DIAGNOSIS:  proximal rectal cancer sigmoid perforation  PROCEDURE:  low anterior resection end colostomy   Surgeon(s): [name], MD  ASSISTANT: Kaylyn Lim, MD   ANESTHESIA:   local and general  EBL: 61ml Total I/O In: 4300 [I.V.:1000; IV Piggyback:3300] Out: 550 [Urine:200; Blood:350]  Delay start of Pharmacological VTE agent (>24hrs) due to surgical blood loss or risk of bleeding:  no  DRAINS: (13F) Jackson-Pratt drain(s) with closed bulb suction in the pelvis   SPECIMEN:  Source of Specimen:  Rectosigmoid colon, additional sigmoid colon  DISPOSITION OF SPECIMEN:  PATHOLOGY  COUNTS:  YES  PLAN OF CARE: Admit to inpatient   PATIENT DISPOSITION:  PACU - hemodynamically stable.  INDICATION:    69 y.o. female who presented to the ED with severe abdominal pain.  CT scan shows perforated viscus.  I recommended emergent exploratory laparotomy.   The patient expressed understanding & wished to proceed with surgery.  OR FINDINGS:   Patient had proximal rectal cancer with acutely perforated distal sigmoid colon  No obvious metastatic disease on visceral parietal peritoneum or liver.     DESCRIPTION:   Informed consent was confirmed.  The patient underwent general anaesthesia without difficulty.  The patient was positioned appropriately.  VTE prevention in place.  The patient's abdomen was clipped, prepped, & draped in a sterile fashion.  Surgical timeout confirmed our plan.  I began by making an upper midline incision.  This was carried down through the subcutaneous tissues through to the fascia using electrocautery.  The abdomen was entered bluntly.  Upon entering the abdomen (organ space), I  encountered purulent ascites and feculent peritonitis.  This was aspirated.  I inspected the stomach and duodenum.  There was no sign of perforation.  I inspected the transverse colon.  This appeared free from inflammation as well.  I inspected the cecum and appendix.  These appeared to have some reactive inflammation but were not the source of perforation.  I then evaluated the splenic flexure.  There was no palpable masses or perforations noted.  I continued down into the descending colon which also was palpated and felt to be normal.  I then identified the sigmoid colon and there was a 1 cm defect in the distal sigmoid colon on the left side which was leaking stool into the abdomen.  I palpated distal to this and there was a mass in the proximal rectum with an anterior ulceration.  I decided to perform a low anterior resection.  I divided the peritoneal reflection on either side using electrocautery.  I can to need this dissection down until the peritoneal reflection.  Once this was opened I evaluated for the left and the right ureters.  These were both identified and preserved.  I opened the right side of the mesentery and identified the presacral plane.  This was bluntly dissected.  I then dissected out the lateral borders using electrocautery.  I came down approximately 3 cm distal to the rectal mass.  I divided the mesentery at this region using a LigaSure device.  The rectum was then divided with a blue load contour device.  I placed 2-0 Prolene sutures on either side of the staple line for future identification.  I then divided the proximal  sigmoid also using the contour device.  I divided the mesentery up to this level.  I then identified the IMA and IMV.  I transected these approximately 2 cm above the takeoff from the aorta.  I dissected out the remaining mesentery containing the IMV lymph nodes.  This was sent as an additional specimen to the rectosigmoid colon.  Once this was complete the abdomen was  irrigated with approximately 5 L of warm normal saline.  We reinspected the ureters to confirm that they were intact.  I placed a 69 [place] Blake drain into the pelvis and brought this out through the right lower quadrant.  This was secured into place with a 2-0 nylon suture.  Seprafilm was placed in the pelvis to help with adhesions postoperatively.  I then brought out a portion of distal descending colon through an ostomy defect through the rectus musc on the patient's left side.  The remaining colon was brought out through this area and secured.  I then closed the fascia using two #1 PDS sutures in a running fashion.  The skin was left open and packed.  The colostomy was matured in standard Brooke fashion using interrupted 2-0 Vicryl sutures.  An ostomy appliance was placed.  The patient was then awakened from anesthesia and sent to the postanesthesia care unit in stable condition.  All counts were correct per operating room staff.

## 2020-03-03 NOTE — ED Provider Notes (Signed)
[facility] DEPT Provider Note   CSN: [id number] Arrival date & time: 03/03/20  1125     History Chief Complaint  Patient presents with  . Abdominal Pain  . Nausea    Tracy [name] is a 69 y.o. female.  Patient with h/o asthma/COPD, no previous surgical history, history of daily alcohol use (wine) --presents to the emergency department today with complaint of worsening severe abdominal pain.  Patient developed pain 2 days ago and was slightly improved yesterday before worsening.  She has had associated nausea without vomiting.  Pain was initially in the lower abdomen and is now generalized.  Pain is worse with movement.  She has not had a bowel movement in the past 2 days.  She passed some gas yesterday.  No dysuria, hematuria, increased frequency or urgency.  No fever or blood in the stool.  She has been taking Tylenol and Aleve since symptoms started, no chronic NSAID use.  She has never had pain like this in the past.        Past Medical History:  Diagnosis Date  . Asthma   . COPD (chronic obstructive pulmonary disease) (Tracy [name]) 2011   FeV1 31% predicted FeV1/FVX 47 %  . Hemorrhoid   . Osteopenia   . Seasonal allergies    takes Claritin daily prn  . Vitamin D deficiency    takes Vit d every 14 days    Patient Active Problem List   Diagnosis Date Noted  . ALLERGIC RHINITIS, SEASONAL 01/06/2011  . HIP PAIN, LEFT 01/06/2011  . ELEVATED BP READING WITHOUT DX HYPERTENSION 01/06/2011  . CHICKENPOX, HX OF 01/06/2011  . EMPHYSEMA 11/03/2007  . 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 AUGMENTATION MAMMAPLASTY     saline  . BREAST ENHANCEMENT SURGERY    . EXAMINATION UNDER ANESTHESIA  10/14/2012   Procedure: EXAM UNDER ANESTHESIA;  Surgeon: Tracy Curry, MD,FACS;  Location: [facility];  Service: General;  Laterality: N/A;  rectal exam under anesthesia excisional hemorrhoidectomy  hemorrhoidal banding x two  . EXAMINATION UNDER ANESTHESIA  02/03/2013   excision hemorrhoidal tissue  . FOOT SURGERY     left bunionectomy  . HEMORRHOIDECTOMY WITH HEMORRHOID BANDING  10/14/2012   Procedure: HEMORRHOIDECTOMY WITH HEMORRHOID BANDING;  Surgeon: Tracy Curry, MD,FACS;  Location: [facility];  Service: General;  Laterality: N/A;  . OPEN REDUCTION INTERNAL FIXATION (ORIF) DISTAL RADIAL FRACTURE Right 11/01/2018   Procedure: OPEN REDUCTION INTERNAL FIXATION (ORIF) DISTAL RADIAL FRACTURE;  Surgeon: Tracy Cover, MD;  Location: [facility];  Service: Orthopedics;  Laterality: Right;  . TONSILLECTOMY  age 47    recurrent otitis media     OB History    Gravida  2   Para  2   Term      Preterm      AB      Living  2     SAB      TAB      Ectopic      Multiple      Live Births              Family History  Problem Relation Age of Onset  . Osteoporosis Mother   . Other Father        [place] in snow, broke ankle, blood clot to lungs  . Heart murmur Sister   . Other Brother        h/o  being hit by lightening  . Parkinsonism Brother   . Asthma Son   . Allergies Son   . Otitis media Son   . Other Sister        CML  . Asthma Son   . Allergies Son   . Pancreatic cancer Brother        diag January 03, 2014-deceased March 05, 2014    Social History   Tobacco Use  . Smoking status: Former Smoker    Packs/day: 0.50    Years: 20.00    Pack years: 10.00    Types: Cigarettes    Quit date: 09/14/1999    Years since quitting: 20.4  . Smokeless tobacco: Never Used  Substance Use Topics  . Alcohol use: Yes    Alcohol/week: 0.0 standard drinks    Comment: glass of wine  . Drug use: No    Home Medications Prior to Admission medications   Medication Sig Start Date End Date Taking? Authorizing Provider  albuterol (PROVENTIL) (2.5 MG/3ML) 0.083% nebulizer solution INHALE 3ML VIA NEBULIZER EVERY 4 HOURS AS NEEDED FOR SHORTNESS OF BREATH 11/23/19   Icard, Tracy Graves,  DO  Fluticasone-Umeclidin-Vilant (TRELEGY ELLIPTA) 100-62.5-25 MCG/INH AEPB Inhale 1 puff into the lungs daily. 02/15/20   [name], DO  HYDROcodone-acetaminophen (NORCO) 5-325 MG tablet 1-2 tabs po q6 hours prn pain 11/01/18   Tracy Cover, MD  loratadine-pseudoephedrine (CLARITIN-D 24 HOUR) 10-240 MG 24 hr tablet Take 1 tablet by mouth daily. 07/24/19   Icard, Tracy Graves, DO  Tiotropium Bromide-Olodaterol (STIOLTO RESPIMAT) 2.5-2.5 MCG/ACT AERS Inhale 2 puffs into the lungs daily. 10/10/18   Tracy Space, MD    Allergies    Codeine  Review of Systems   Review of Systems  Constitutional: Negative for chills and fever.  HENT: Negative for rhinorrhea and sore throat.   Eyes: Negative for redness.  Respiratory: Negative for cough and shortness of breath.   Cardiovascular: Negative for chest pain.  Gastrointestinal: Positive for abdominal pain, constipation and nausea. Negative for diarrhea and vomiting.  Genitourinary: Negative for dysuria, frequency, hematuria and urgency.  Musculoskeletal: Negative for myalgias.  Skin: Negative for rash.  Neurological: Negative for headaches.    Physical Exam Updated Vital Signs BP (!) 86/61 (BP Location: Left Arm)   Pulse 93   Temp 98 F (36.7 C) (Oral)   Resp 18   LMP 12/14/2000   SpO2 95%   Physical Exam Vitals and nursing note reviewed.  Constitutional:      General: She is in acute distress (Pain with movement).     Appearance: She is well-developed.  HENT:     Head: Normocephalic and atraumatic.  Eyes:     General:        Right eye: No discharge.        Left eye: No discharge.     Conjunctiva/sclera: Conjunctivae normal.  Cardiovascular:     Rate and Rhythm: Normal rate and regular rhythm.     Heart sounds: Normal heart sounds.  Pulmonary:     Effort: Pulmonary effort is normal.     Breath sounds: Normal breath sounds.  Abdominal:     Palpations: Abdomen is soft.     Tenderness: There is generalized abdominal  tenderness (moderate). There is guarding and rebound. Negative signs include McBurney's sign.     Comments: Patient winces with any movement including lowering of the head of the bed.  She prefers to sit upright and still.  Musculoskeletal:     Cervical back: Normal range  of motion and neck supple.  Skin:    General: Skin is warm and dry.  Neurological:     Mental Status: She is alert.     ED Results / Procedures / Treatments   Labs (all labs ordered are listed, but only abnormal results are displayed) Labs Reviewed  CBC WITH DIFFERENTIAL/PLATELET - Abnormal; Notable for the following components:      Result Value   WBC 16.2 (*)    HCT 46.8 (*)    MCV 101.3 (*)    Neutro Abs 15.3 (*)    Lymphs Abs 0.4 (*)    All other components within normal limits  COMPREHENSIVE METABOLIC PANEL - Abnormal; Notable for the following components:   Glucose, Bld 129 (*)    BUN 34 (*)    Creatinine, Ser 1.43 (*)    Total Bilirubin 1.4 (*)    GFR calc non Af Amer 38 (*)    GFR calc Af Amer 44 (*)    All other components within normal limits  LACTIC ACID, PLASMA - Abnormal; Notable for the following components:   Lactic Acid, Venous 3.7 (*)    All other components within normal limits  RESPIRATORY PANEL BY RT PCR (FLU A&B, COVID)  LIPASE, BLOOD  URINALYSIS, ROUTINE W REFLEX MICROSCOPIC    EKG None  Radiology CT ABDOMEN PELVIS W CONTRAST  Result Date: 03/03/2020 CLINICAL DATA:  Acute generalized abdominal pain. Neutropenia. Question of peritonitis. Severe abdominal pain and nausea since yesterday. EXAM: CT ABDOMEN AND PELVIS WITH CONTRAST TECHNIQUE: Multidetector CT imaging of the abdomen and pelvis was performed using the standard protocol following bolus administration of intravenous contrast. CONTRAST:  90mL OMNIPAQUE IOHEXOL 300 MG/ML  SOLN COMPARISON:  None. FINDINGS: Lower chest: Panlobular emphysematous changes at the lung bases. The heart size is normal. Hepatobiliary: The gallbladder  is present and is distended. No pericholecystic fluid or calcified stones. Common bile duct normal in caliber. Liver is normal in density. A subcapsular oval hyperdense mass is identified within the MEDIAL segment of the LEFT hepatic lobe measuring 1.6 x 1.0 centimeters on image 19 of series 2. Pancreas: Unremarkable. No pancreatic ductal dilatation or surrounding inflammatory changes. Spleen: Normal in size without focal abnormality. Adrenals/Urinary Tract: Adrenal glands are normal in appearance. Renal scarring bilaterally. No hydronephrosis. No ureteral obstruction. Urinary bladder is decompressed. Stomach/Bowel: Small hiatal hernia. Stomach is otherwise unremarkable. Jejunal loops have a thickened wall but appear normal in caliber. There is wall thickening, wall enhancement, and associated inflammatory changes surrounding loops within the pelvis, likely ileal loops. The appendix is well seen and has a normal appearance. There is impacted stool throughout the ascending, transverse colon and splenic flexure. There is abrupt transition of caliber of the colon at the level of the proximal ascending colon where there is transition to attenuated proximal descending colon. In this region there is a collection of fluid and gas consistent with perforation. This collection measures 4.8 x 6.0 centimeters, seen LATERAL to the LEFT kidney and below the inferior aspect of the spleen. No discrete mass identified. Vascular/Lymphatic: There is atherosclerotic calcification of the abdominal aorta not associated with aneurysm. No significant abdominal adenopathy. Reproductive: Uterus is present. No adnexal mass. Other: Large amounts of free intraperitoneal air within the UPPER abdomen. Small locule sub gas are identified in the LEFT UPPER QUADRANT, adjacent to the spleen and stomach. Trace amount of ascites within the pelvis. Musculoskeletal: No acute or significant osseous findings. IMPRESSION: 1. Free intraperitoneal air  consistent perforation. Most likely  site of perforation is in the distal splenic flexure/proximal descending colon where there is a collection of air and gas measuring 6 centimeters. No obvious soft tissue mass identified in this region. At this site, there is abrupt transition of dilated, stool-filled colon to completely decompressed proximal descending colon. 2. Thickened, inflamed loops of jejunum are identified within the pelvis and are likely reactive. 3. Small hiatal hernia. 4. Benign-appearing 1.6 centimeter mass the LEFT hepatic lobe. Recommend comparison with prior studies if available. 5.  Emphysema (ICD10-J43.9). 6.  Aortic Atherosclerosis (ICD10-I70.0). 7. Bilateral renal scarring. These results were called by telephone at the time of interpretation on 03/03/2020 at 2:30 pm to provider [facility] , who verbally acknowledged these results. Electronically Signed   By: Nolon Nations M.D.   On: 03/03/2020 14:35    Procedures Procedures (including critical care time)  Medications Ordered in ED Medications  sodium chloride 0.9 % bolus 1,000 mL (has no administration in time range)  sodium chloride 0.9 % bolus 1,000 mL (has no administration in time range)  piperacillin-tazobactam (ZOSYN) IVPB 3.375 g (has no administration in time range)  sodium chloride 0.9 % bolus 1,000 mL (0 mLs Intravenous Stopped 03/03/20 1348)  ondansetron (ZOFRAN) injection 4 mg (4 mg Intravenous Given 03/03/20 1237)  sodium chloride (PF) 0.9 % injection (  Given by Other 03/03/20 1330)  fentaNYL (SUBLIMAZE) injection 100 mcg (100 mcg Intravenous Given 03/03/20 1345)  iohexol (OMNIPAQUE) 300 MG/ML solution 80 mL (80 mLs Intravenous Contrast Given 03/03/20 1344)    ED Course  I have reviewed the triage vital signs and the nursing notes.  Pertinent labs & imaging results that were available during my care of the patient were reviewed by me and considered in my medical decision making (see chart for  details).  Patient seen and examined. Work-up initiated. Patient has exam concerninf for peritonitis. CT ordered at initial eval. Medications ordered. BP noted, pt with normal mentation. Will obtain orthostatic vital signs.   Vital signs reviewed and are as follows: BP (!) 86/61 (BP Location: Left Arm)   Pulse 93   Temp 98 F (36.7 C) (Oral)   Resp 18   LMP 12/14/2000   SpO2 95%   1:46 PM Lactate elevated. Additional fluids and IV zosyn ordered. Awaiting WBC, CT.   1:59 PM CT shows perforation. RN aware and is initiating abx now. COVID ordered. Pt updated.   2:08 PM Spoke with Dr. Marcello Moores of general surgery.   2:32 PM Spoke with radiologist. Likely colonic source of perforation.   CRITICAL CARE Performed by: Carlisle Cater PA-C Total critical care time: 45 minutes Critical care time was exclusive of separately billable procedures and treating other patients. Critical care was necessary to treat or prevent imminent or life-threatening deterioration. Critical care was time spent personally by me on the following activities: development of treatment plan with patient and/or surrogate as well as nursing, discussions with consultants, evaluation of patient's response to treatment, examination of patient, obtaining history from patient or surrogate, ordering and performing treatments and interventions, ordering and review of laboratory studies, ordering and review of radiographic studies, pulse oximetry and re-evaluation of patient's condition.      MDM Rules/Calculators/A&P                      Admit for emergent surgical intervention.    Final Clinical Impression(s) / ED Diagnoses Final diagnoses:  Perforated viscus    Rx / DC Orders ED Discharge Orders  None       Carlisle Cater, Hershal Coria 03/03/20 1442    Lacretia Leigh, MD 03/05/20 [phone]

## 2020-03-03 NOTE — ED Notes (Signed)
CRITICAL VALUE STICKER   CRITICAL VALUE: 3.7 lactic acid   RECEIVER (on-site recipient of call):  DATE & TIME NOTIFIED: today now  MESSENGER (representative from lab): Whitney Post   MD NOTIFIED: Zenia Resides MD  [place]: now    RESPONSE: notes

## 2020-03-03 NOTE — Progress Notes (Signed)
Paged Tracy Moores, MD and she returned my page. Discussed patient's blood pressure 81/55 with MAP 60. MD gave order to give 1 Liter LR bolus and to increase IVF rate to 125 cc/H.

## 2020-03-03 NOTE — Anesthesia Postprocedure Evaluation (Signed)
Anesthesia Post Note  Patient: JAHLIA READUS  Procedure(s) Performed: low anterior resection end colostomy (N/A Abdomen)     Patient location during evaluation: PACU Anesthesia Type: General Level of consciousness: awake and alert Pain management: pain level controlled Vital Signs Assessment: post-procedure vital signs reviewed and stable Respiratory status: spontaneous breathing, nonlabored ventilation, respiratory function stable and patient connected to nasal cannula oxygen Cardiovascular status: blood pressure returned to baseline and stable Postop Assessment: no apparent nausea or vomiting Anesthetic complications: no    Last Vitals:  Vitals:   03/03/20 1912 03/03/20 1915  BP: 106/72 122/63  Pulse: 84 77  Resp: 15 16  Temp:    SpO2: 94% 96%    Last Pain:  Vitals:   03/03/20 1904  TempSrc:   PainSc: (P) 0-No pain                 Lidia Collum

## 2020-03-03 NOTE — Anesthesia Preprocedure Evaluation (Signed)
Anesthesia Evaluation  Patient identified by MRN, date of birth, ID band Patient awake    Reviewed: Allergy & Precautions, NPO status , Patient's Chart, lab work & pertinent test results  History of Anesthesia Complications Negative for: history of anesthetic complications  Airway Mallampati: II  TM Distance: >3 FB Neck ROM: Full    Dental  (+) Teeth Intact   Pulmonary asthma , COPD,  COPD inhaler, former smoker,    Pulmonary exam normal        Cardiovascular negative cardio ROS Normal cardiovascular exam     Neuro/Psych negative neurological ROS  negative psych ROS   GI/Hepatic Neg liver ROS, Bowel perforation   Endo/Other  negative endocrine ROS  Renal/GU Renal disease (AKI, Cr 1.43)  negative genitourinary   Musculoskeletal negative musculoskeletal ROS (+)   Abdominal   Peds  Hematology negative hematology ROS (+)   Anesthesia Other Findings   Reproductive/Obstetrics                             Anesthesia Physical Anesthesia Plan  ASA: III and emergent  Anesthesia Plan: General   Post-op Pain Management:    Induction: Intravenous and Rapid sequence  PONV Risk Score and Plan: 4 or greater and Ondansetron, Dexamethasone, Treatment may vary due to age or medical condition and Midazolam  Airway Management Planned: Oral ETT  Additional Equipment: None  Intra-op Plan:   Post-operative Plan: Extubation in OR and Possible Post-op intubation/ventilation  Informed Consent: I have reviewed the patients History and Physical, chart, labs and discussed the procedure including the risks, benefits and alternatives for the proposed anesthesia with the patient or authorized representative who has indicated his/her understanding and acceptance.     Dental advisory given  Plan Discussed with:   Anesthesia Plan Comments: ( Discussed with patient possibility of postop ventilation if  needed.)        Anesthesia Quick Evaluation

## 2020-03-03 NOTE — Transfer of Care (Signed)
Immediate Anesthesia Transfer of Care Note  Patient: Tracy [name]  Procedure(s) Performed: low anterior resection end colostomy (N/A Abdomen)  Patient Location: PACU  Anesthesia Type:General  Level of Consciousness: sedated, patient cooperative and responds to stimulation  Airway & Oxygen Therapy: Patient Spontanous Breathing and Patient connected to face mask oxygen  Post-op Assessment: Report given to RN and Post -op Vital signs reviewed and stable  Post vital signs: Reviewed and stable  Last Vitals:  Vitals Value Taken Time  BP    Temp    Pulse    Resp    SpO2      Last Pain:  Vitals:   03/03/20 1500  TempSrc:   PainSc: 6          Complications: No apparent anesthesia complications

## 2020-03-03 NOTE — ED Notes (Signed)
Pt ambulatory to RR with assistance from husband. Pt reports unable to void at this time. Pt aware urine sample needed

## 2020-03-04 LAB — BASIC METABOLIC PANEL
Anion gap: 11 (ref 5–15)
BUN: 35 mg/dL — ABNORMAL HIGH (ref 8–23)
CO2: 17 mmol/L — ABNORMAL LOW (ref 22–32)
Calcium: 7.5 mg/dL — ABNORMAL LOW (ref 8.9–10.3)
Chloride: 109 mmol/L (ref 98–111)
Creatinine, Ser: 1.9 mg/dL — ABNORMAL HIGH (ref 0.44–1.00)
GFR calc Af Amer: 31 mL/min — ABNORMAL LOW (ref >60)
GFR calc non Af Amer: 27 mL/min — ABNORMAL LOW (ref >60)
Glucose, Bld: 101 mg/dL — ABNORMAL HIGH (ref 70–99)
Potassium: 6.1 mmol/L — ABNORMAL HIGH (ref 3.5–5.1)
Sodium: 137 mmol/L (ref 135–145)

## 2020-03-04 LAB — CBC
HCT: 42.8 % (ref 36.0–46.0)
Hemoglobin: 13.1 g/dL (ref 12.0–15.0)
MCH: 31.4 pg (ref 26.0–34.0)
MCHC: 30.6 g/dL (ref 30.0–36.0)
MCV: 102.6 fL — ABNORMAL HIGH (ref 80.0–100.0)
Platelets: 364 10*3/uL (ref 150–400)
RBC: 4.17 MIL/uL (ref 3.87–5.11)
RDW: 12.4 % (ref 11.5–15.5)
WBC: 13.2 10*3/uL — ABNORMAL HIGH (ref 4.0–10.5)
nRBC: 0 % (ref 0.0–0.2)

## 2020-03-04 LAB — POTASSIUM
Potassium: 6.1 mmol/L — ABNORMAL HIGH (ref 3.5–5.1)
Potassium: 5.4 mmol/L — ABNORMAL HIGH (ref 3.5–5.1)
Potassium: 5.7 mmol/L — ABNORMAL HIGH (ref 3.5–5.1)
Potassium: 5.6 mmol/L — ABNORMAL HIGH (ref 3.5–5.1)

## 2020-03-04 LAB — NA AND K (SODIUM & POTASSIUM), RAND UR
Potassium Urine: 69 mmol/L
Sodium, Ur: <10 mmol/L

## 2020-03-04 LAB — LACTIC ACID, PLASMA: Lactic Acid, Venous: 2.5 mmol/L (ref 0.5–1.9)

## 2020-03-04 MED ORDER — INSULIN ASPART 100 UNIT/ML ~~LOC~~ SOLN
5.0000 [IU] | Freq: Once | SUBCUTANEOUS | Status: AC
  Administered 2020-03-04: 5 [IU] via SUBCUTANEOUS

## 2020-03-04 MED ORDER — ALBUMIN HUMAN 25 % IV SOLN
12.5000 g | Freq: Once | INTRAVENOUS | Status: AC
  Administered 2020-03-04: 12.5 g via INTRAVENOUS
  Filled 2020-03-04: qty 50

## 2020-03-04 MED ORDER — SODIUM CHLORIDE 0.9 % IV SOLN: INTRAVENOUS | Status: DC

## 2020-03-04 MED ORDER — SODIUM CHLORIDE 0.9 % IV BOLUS
1000.0000 mL | Freq: Once | INTRAVENOUS | Status: AC
  Administered 2020-03-04: 06:00:00 1000 mL via INTRAVENOUS

## 2020-03-04 MED ORDER — SODIUM CHLORIDE 0.9 % IV BOLUS
1000.0000 mL | Freq: Once | INTRAVENOUS | Status: AC
  Administered 2020-03-04: 1000 mL via INTRAVENOUS

## 2020-03-04 MED ORDER — ENOXAPARIN SODIUM 30 MG/0.3ML ~~LOC~~ SOLN
30.0000 mg | SUBCUTANEOUS | Status: DC
  Administered 2020-03-05 – 2020-03-08 (×4): 30 mg via SUBCUTANEOUS
  Filled 2020-03-04 (×4): qty 0.3

## 2020-03-04 MED ORDER — FUROSEMIDE 10 MG/ML IJ SOLN
40.0000 mg | Freq: Once | INTRAMUSCULAR | Status: AC
  Administered 2020-03-04: 40 mg via INTRAVENOUS
  Filled 2020-03-04: qty 4

## 2020-03-04 MED ORDER — CHLORHEXIDINE GLUCONATE CLOTH 2 % EX PADS
6.0000 | MEDICATED_PAD | Freq: Every day | CUTANEOUS | Status: DC
  Administered 2020-03-05 – 2020-04-05 (×31): 6 via TOPICAL

## 2020-03-04 MED ORDER — DEXTROSE 50 % IV SOLN
1.0000 | Freq: Once | INTRAVENOUS | Status: AC
  Administered 2020-03-04: 50 mL via INTRAVENOUS
  Filled 2020-03-04: qty 50

## 2020-03-04 MED ORDER — CALCIUM GLUCONATE-NACL 2-0.675 GM/100ML-% IV SOLN
2.0000 g | Freq: Once | INTRAVENOUS | Status: AC
  Administered 2020-03-04: 2000 mg via INTRAVENOUS
  Filled 2020-03-04: qty 100

## 2020-03-04 NOTE — Progress Notes (Signed)
Paged [facility] service to notify MD of patient's recent K of 5.7. Awaiting call back but will continue to monitor

## 2020-03-04 NOTE — TOC Initial Note (Signed)
Transition of Care [facility]) - Initial/Assessment Note    Patient Details  Name: Tracy [name] MRN: [id number] Date of Birth: 1951-09-07  Transition of Care [facility]) CM/SW Contact:    Lennart Pall, LCSW Phone Number: 03/04/2020, 1:27 PM  Clinical Narrative:      Briefly introduced myself to pt who then falls asleep and spouse completes assessment interview.  Spouse very supportive and confirms that he is very capable of providing 24/7 support for her at d/c.  They have good support from their 2 adult children (one living in area and other in [place]).  Spouse quietly notes that pt is emotionally having a difficult time with new colostomy.  He is very encouraging and "ready to learn" colostomy care.  He is aware that TOC will follow for d/c planning needs.               Expected Discharge Plan: [place] Barriers to Discharge: Continued Medical Work up   Patient Goals and CMS Choice Patient states their goals for this hospitalization and ongoing recovery are:: Spouse says goal is "to get her home"      Expected Discharge Plan and Services Expected Discharge Plan: [place] In-house Referral: Clinical Social Work     Living arrangements for the past 2 months: Single Family Home                                      Prior Living Arrangements/Services Living arrangements for the past 2 months: Single Family Home Lives with:: Spouse Patient language and need for interpreter reviewed:: Yes Do you feel safe going back to the place where you live?: Yes      Need for Family Participation in Patient Care: Yes (Comment) Care giver support system in place?: Yes (comment)   Criminal Activity/Legal Involvement Pertinent to Current Situation/Hospitalization: No - Comment as needed  Activities of Daily Living Home Assistive Devices/Equipment: Nebulizer ADL Screening (condition at time of admission) Patient's cognitive ability adequate to safely  complete daily activities?: Yes Is the patient deaf or have difficulty hearing?: No Does the patient have difficulty seeing, even when wearing glasses/contacts?: No Does the patient have difficulty concentrating, remembering, or making decisions?: No Patient able to express need for assistance with ADLs?: Yes Does the patient have difficulty dressing or bathing?: Yes Independently performs ADLs?: No Communication: Independent Dressing (OT): Needs assistance Is this a change from baseline?: Change from baseline, expected to last >3 days Grooming: Needs assistance Is this a change from baseline?: Change from baseline, expected to last >3 days Feeding: Needs assistance Is this a change from baseline?: Change from baseline, expected to last <3 days Bathing: Needs assistance Is this a change from baseline?: Change from baseline, expected to last >3 days Toileting: Needs assistance Is this a change from baseline?: Change from baseline, expected to last >3days In/Out Bed: Needs assistance Is this a change from baseline?: Change from baseline, expected to last >3 days Walks in Home: Independent Does the patient have difficulty walking or climbing stairs?: No Weakness of Legs: None Weakness of Arms/Hands: None  Permission Sought/Granted Permission sought to share information with : Family Supports Permission granted to share information with : Yes, Verbal Permission Granted  Share Information with NAME: Attalia Gronau     Permission granted to share info w Relationship: spouse  Permission granted to share info w Contact Information: [phone]  Emotional Assessment Appearance:: Appears stated age Attitude/Demeanor/Rapport: Lethargic Affect (typically observed): Quiet, Unable to Assess Orientation: : Oriented to Self, Oriented to Place, Oriented to Situation Alcohol / Substance Use: Alcohol Use Psych Involvement: No (comment)  Admission diagnosis:  Perforated viscus  [R19.8] Perforated sigmoid colon ([place]) [K63.1] Patient Active Problem List   Diagnosis Date Noted  . Perforated sigmoid colon ([place]) 03/03/2020  . ALLERGIC RHINITIS, SEASONAL 01/06/2011  . HIP PAIN, LEFT 01/06/2011  . ELEVATED BP READING WITHOUT DX HYPERTENSION 01/06/2011  . CHICKENPOX, HX OF 01/06/2011  . EMPHYSEMA 11/03/2007  . 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

## 2020-03-04 NOTE — Evaluation (Signed)
Physical Therapy Evaluation Patient Details Name: Tracy [name] MRN: [id number] DOB: Nov 29, 1951 Today's Date: 03/04/2020   History of Present Illness  BRIDGETT LECY is an 69 y.o. female who is here for abd pain,  diarrhea for several weeks. Found to have bowel perforation, perforated viscus.   S/Plow anterior resection end colostomy on 03/03/20  Clinical Impression  The patient tolerated stand and pivot to recliner with mod steady assistance of 1 person. Patient should progress to  Dc home with family support. Pt admitted with above diagnosis.  Pt currently with functional limitations due to the deficits listed below (see PT Problem List). Pt will benefit from skilled PT to increase their independence and safety with mobility to allow discharge to the venue listed below.    Patient tolerated RA  For transfer with SPO2 95%, HR 100. Replaced back to 2 L.    Follow Up Recommendations Home health PT(may not require at Dc.)    Equipment Recommendations  None recommended by PT    Recommendations for Other Services       Precautions / Restrictions Precautions Precautions: Fall Precaution Comments: colostomy, right JP, NG suction Restrictions Weight Bearing Restrictions: No      Mobility  Bed Mobility Overal bed mobility: Needs Assistance Bed Mobility: Rolling;Sidelying to Sit Rolling: Mod assist Sidelying to sit: Mod assist       General bed mobility comments: pt has pillow against abdomen,  using bed pad, assisted to roll, scoot to bed edge and sit upright  Transfers Overall transfer level: Needs assistance Equipment used: 1 person hand held assist Transfers: Sit to/from [employer] Transfers Sit to Stand: Mod assist Stand pivot transfers: Mod assist       General transfer comment: patient hugging pillow throughout. patient stood and took small steps to recliner with 1 HHA.  Ambulation/Gait             General Gait Details: NT  Stairs             Wheelchair Mobility    Modified Rankin (Stroke Patients Only)       Balance Overall balance assessment: Needs assistance Sitting-balance support: Single extremity supported;Feet supported Sitting balance-Leahy Scale: Fair     Standing balance support: Single extremity supported;During functional activity Standing balance-Leahy Scale: Fair                               Pertinent Vitals/Pain Pain Assessment: 0-10 Pain Score: 6  Pain Location: abdomen Pain Descriptors / Indicators: Cramping;Grimacing;Guarding Pain Intervention(s): Limited activity within patient's tolerance;Monitored during session;Premedicated before session;Repositioned    Home Living Family/patient expects to be discharged to:: Private residence Living Arrangements: Spouse/significant other Available Help at Discharge: Family Type of Home: House Home Access: Stairs to enter   Technical brewer of Steps: 1 Home Layout: One level Home Equipment: None      Prior Function Level of Independence: Independent               Hand Dominance        Extremity/Trunk Assessment   Upper Extremity Assessment Upper Extremity Assessment: Generalized weakness    Lower Extremity Assessment Lower Extremity Assessment: Generalized weakness    Cervical / Trunk Assessment Cervical / Trunk Assessment: Normal  Communication   Communication: No difficulties  Cognition Arousal/Alertness: Lethargic;Suspect due to medications Behavior During Therapy: [facility] for tasks assessed/performed Overall Cognitive Status: Within Functional Limits for tasks assessed  General Comments      Exercises     Assessment/Plan    PT Assessment Patient needs continued PT services  PT Problem List Decreased strength;Decreased mobility;Decreased knowledge of use of DME;Decreased activity tolerance;Pain       PT Treatment Interventions DME  instruction;Functional mobility training;Patient/family education;Gait training;Therapeutic activities;Therapeutic exercise    PT Goals (Current goals can be found in the Care Plan section)  Acute Rehab PT Goals Patient Stated Goal: to go home PT Goal Formulation: With patient/family Time For Goal Achievement: 03/18/20 Potential to Achieve Goals: Good    Frequency Min 3X/week   Barriers to discharge        Co-evaluation               AM-PAC PT "6 Clicks" Mobility  Outcome Measure Help needed turning from your back to your side while in a flat bed without using bedrails?: A Lot Help needed moving from lying on your back to sitting on the side of a flat bed without using bedrails?: A Lot Help needed moving to and from a bed to a chair (including a wheelchair)?: A Lot Help needed standing up from a chair using your arms (e.g., wheelchair or bedside chair)?: A Lot Help needed to walk in hospital room?: A Lot Help needed climbing 3-5 steps with a railing? : A Lot 6 Click Score: 12    End of Session Equipment Utilized During Treatment: Oxygen Activity Tolerance: No increased pain;Patient limited by fatigue Patient left: in chair;with call bell/phone within reach;with family/visitor present Nurse Communication: Mobility status PT Visit Diagnosis: Unsteadiness on feet (R26.81);Difficulty in walking, not elsewhere classified (R26.2)    Time: [id number] PT Time Calculation (min) (ACUTE ONLY): 41 min   Charges:   PT Evaluation $PT Eval Moderate Complexity: 1 Mod PT Treatments $Therapeutic Activity: 23-37 mins        Tresa Endo PT Acute Rehabilitation Services Pager [phone] Office [phone]   Claretha Cooper 03/04/2020, 10:43 AM

## 2020-03-04 NOTE — Progress Notes (Signed)
Pharmacy: [facility] Renal dose adjustment  [facility] 40 mg>> 30 mg for CrCl < 30 ml/min  Eudelia Bunch, Pharm.D [phone] 03/04/2020 10:29 AM

## 2020-03-04 NOTE — Progress Notes (Signed)
Paged Marcello Moores, MD to make her aware of 80 cc UOP total for tonight. MD called back and discussed patient's vital signs and that bmet has not yet resulted due to needing to be redrawn. MD gave order for 1 liter NS bolus.

## 2020-03-04 NOTE — Progress Notes (Signed)
1 Day Post-Op   Subjective/Chief Complaint: Awake, alert, abdominal soreness Asking for some Sprite Has had some hypotension, low UOP overnight - responded to fluid boluses   Objective: Vital signs in last 24 hours: Temp:  [97.5 F (36.4 C)-98 F (36.7 C)] 97.8 F (36.6 C) (03/22 0327) Pulse Rate:  [77-102] 99 (03/22 0700) Resp:  [11-19] 12 (03/22 0700) BP: (80-139)/(55-110) 110/68 (03/22 0700) SpO2:  [91 %-99 %] 94 % (03/22 0748) Weight:  [63 kg-64.7 kg] 64.7 kg (03/22 0500)    Intake/Output from previous day: 03/21 0701 - 03/22 0700 In: 9111.6 [I.V.:3761.3; IV Piggyback:5350.3] Out: [id number] [Urine:280; Emesis/NG output:150; Drains:455; Blood:350] Intake/Output this shift: No intake/output data recorded.  General appearance: alert, cooperative and no distress Resp: clear to auscultation bilaterally Cardio: regular rate and rhythm, S1, S2 normal, no murmur, click, rub or gallop GI: soft, incisional tenderness; wound clean JP drain - serosanguinous output; ostomy - pink viable  Lab Results:  Recent Labs    03/03/20 1214 03/04/20 0238  WBC 16.2* 13.2*  HGB 14.8 13.1  HCT 46.8* 42.8  PLT 322 364   BMET Recent Labs    03/03/20 1214  NA 136  K 4.8  CL 99  CO2 22  GLUCOSE 129*  BUN 34*  CREATININE 1.43*  CALCIUM 8.9   PT/INR No results for input(s): LABPROT, INR in the last 72 hours. ABG No results for input(s): PHART, HCO3 in the last 72 hours.  Invalid input(s): PCO2, PO2  Studies/Results: CT ABDOMEN PELVIS W CONTRAST  Result Date: 03/03/2020 CLINICAL DATA:  Acute generalized abdominal pain. Neutropenia. Question of peritonitis. Severe abdominal pain and nausea since yesterday. EXAM: CT ABDOMEN AND PELVIS WITH CONTRAST TECHNIQUE: Multidetector CT imaging of the abdomen and pelvis was performed using the standard protocol following bolus administration of intravenous contrast. CONTRAST:  36mL OMNIPAQUE IOHEXOL 300 MG/ML  SOLN COMPARISON:  None. FINDINGS:  Lower chest: Panlobular emphysematous changes at the lung bases. The heart size is normal. Hepatobiliary: The gallbladder is present and is distended. No pericholecystic fluid or calcified stones. Common bile duct normal in caliber. Liver is normal in density. A subcapsular oval hyperdense mass is identified within the MEDIAL segment of the LEFT hepatic lobe measuring 1.6 x 1.0 centimeters on image 19 of series 2. Pancreas: Unremarkable. No pancreatic ductal dilatation or surrounding inflammatory changes. Spleen: Normal in size without focal abnormality. Adrenals/Urinary Tract: Adrenal glands are normal in appearance. Renal scarring bilaterally. No hydronephrosis. No ureteral obstruction. Urinary bladder is decompressed. Stomach/Bowel: Small hiatal hernia. Stomach is otherwise unremarkable. Jejunal loops have a thickened wall but appear normal in caliber. There is wall thickening, wall enhancement, and associated inflammatory changes surrounding loops within the pelvis, likely ileal loops. The appendix is well seen and has a normal appearance. There is impacted stool throughout the ascending, transverse colon and splenic flexure. There is abrupt transition of caliber of the colon at the level of the proximal ascending colon where there is transition to attenuated proximal descending colon. In this region there is a collection of fluid and gas consistent with perforation. This collection measures 4.8 x 6.0 centimeters, seen LATERAL to the LEFT kidney and below the inferior aspect of the spleen. No discrete mass identified. Vascular/Lymphatic: There is atherosclerotic calcification of the abdominal aorta not associated with aneurysm. No significant abdominal adenopathy. Reproductive: Uterus is present. No adnexal mass. Other: Large amounts of free intraperitoneal air within the UPPER abdomen. Small locule sub gas are identified in the LEFT UPPER QUADRANT, adjacent to  the spleen and stomach. Trace amount of ascites  within the pelvis. Musculoskeletal: No acute or significant osseous findings. IMPRESSION: 1. Free intraperitoneal air consistent perforation. Most likely site of perforation is in the distal splenic flexure/proximal descending colon where there is a collection of air and gas measuring 6 centimeters. No obvious soft tissue mass identified in this region. At this site, there is abrupt transition of dilated, stool-filled colon to completely decompressed proximal descending colon. 2. Thickened, inflamed loops of jejunum are identified within the pelvis and are likely reactive. 3. Small hiatal hernia. 4. Benign-appearing 1.6 centimeter mass the LEFT hepatic lobe. Recommend comparison with prior studies if available. 5.  Emphysema (ICD10-J43.9). 6.  Aortic Atherosclerosis (ICD10-I70.0). 7. Bilateral renal scarring. These results were called by telephone at the time of interpretation on 03/03/2020 at 2:30 pm to provider [facility] , who verbally acknowledged these results. Electronically Signed   By: Nolon Nations M.D.   On: 03/03/2020 14:35    Anti-infectives: Anti-infectives (From admission, onward)   Start     Dose/Rate Route Frequency Ordered Stop   03/04/20 2200  piperacillin-tazobactam (ZOSYN) IVPB 3.375 g     3.375 g 12.5 mL/hr over 240 Minutes Intravenous Every 8 hours 03/03/20 2032     03/03/20 1545  cefoTEtan (CEFOTAN) 2 g in sodium chloride 0.9 % 100 mL IVPB     2 g 200 mL/hr over 30 Minutes Intravenous On call to O.R. 03/03/20 1532 03/04/20 0444   03/03/20 1345  piperacillin-tazobactam (ZOSYN) IVPB 3.375 g     3.375 g 12.5 mL/hr over 240 Minutes Intravenous  Once 03/03/20 1330 03/03/20 1507      Assessment/Plan: Perforated sigmoid colon Proximal rectal cancer with obstruction S/p emergent Low anterior resection/ colostomy by Dr. Marcello Moores 3/21  Incentive spirometer Maintain Foley today to monitor UOP - may require more volume Leave drain in place ID - Zosyn PT consult  OOB to chair  today   LOS: 1 day    Maia Petties 03/04/2020

## 2020-03-04 NOTE — Consult Note (Signed)
[place] Nurse ostomy consult note Arranged with patient's spouse to meet tomorrow (Tuesday) at [place] at the bedside for first ostomy pouch change.   Thanks, Maudie Flakes, MSN, RN, [place], Arther Abbott  Pager# [phone]

## 2020-03-05 LAB — RENAL FUNCTION PANEL
Albumin: 2.4 g/dL — ABNORMAL LOW (ref 3.5–5.0)
Anion gap: 13 (ref 5–15)
BUN: 45 mg/dL — ABNORMAL HIGH (ref 8–23)
CO2: 21 mmol/L — ABNORMAL LOW (ref 22–32)
Calcium: 7.9 mg/dL — ABNORMAL LOW (ref 8.9–10.3)
Chloride: 102 mmol/L (ref 98–111)
Creatinine, Ser: 2.29 mg/dL — ABNORMAL HIGH (ref 0.44–1.00)
GFR calc Af Amer: 25 mL/min — ABNORMAL LOW (ref >60)
GFR calc non Af Amer: 22 mL/min — ABNORMAL LOW (ref >60)
Glucose, Bld: 241 mg/dL — ABNORMAL HIGH (ref 70–99)
Phosphorus: 4.7 mg/dL — ABNORMAL HIGH (ref 2.5–4.6)
Potassium: 3.8 mmol/L (ref 3.5–5.1)
Sodium: 136 mmol/L (ref 135–145)

## 2020-03-05 LAB — CBC
HCT: 38.3 % (ref 36.0–46.0)
Hemoglobin: 11.6 g/dL — ABNORMAL LOW (ref 12.0–15.0)
MCH: 32 pg (ref 26.0–34.0)
MCHC: 30.3 g/dL (ref 30.0–36.0)
MCV: 105.8 fL — ABNORMAL HIGH (ref 80.0–100.0)
Platelets: 263 10*3/uL (ref 150–400)
RBC: 3.62 MIL/uL — ABNORMAL LOW (ref 3.87–5.11)
RDW: 12.6 % (ref 11.5–15.5)
WBC: 11.6 10*3/uL — ABNORMAL HIGH (ref 4.0–10.5)
nRBC: 0.2 % (ref 0.0–0.2)

## 2020-03-05 LAB — URINALYSIS, ROUTINE W REFLEX MICROSCOPIC
Bilirubin Urine: NEGATIVE
Glucose, UA: NEGATIVE mg/dL
Ketones, ur: NEGATIVE mg/dL
Leukocytes,Ua: NEGATIVE
Nitrite: NEGATIVE
Protein, ur: NEGATIVE mg/dL
Specific Gravity, Urine: 1.008 (ref 1.005–1.030)
pH: 5 (ref 5.0–8.0)

## 2020-03-05 LAB — BASIC METABOLIC PANEL
Anion gap: 15 (ref 5–15)
BUN: 48 mg/dL — ABNORMAL HIGH (ref 8–23)
CO2: 15 mmol/L — ABNORMAL LOW (ref 22–32)
Calcium: 7.8 mg/dL — ABNORMAL LOW (ref 8.9–10.3)
Chloride: 110 mmol/L (ref 98–111)
Creatinine, Ser: 2.58 mg/dL — ABNORMAL HIGH (ref 0.44–1.00)
GFR calc Af Amer: 22 mL/min — ABNORMAL LOW (ref >60)
GFR calc non Af Amer: 19 mL/min — ABNORMAL LOW (ref >60)
Glucose, Bld: 97 mg/dL (ref 70–99)
Potassium: 6 mmol/L — ABNORMAL HIGH (ref 3.5–5.1)
Sodium: 140 mmol/L (ref 135–145)

## 2020-03-05 LAB — CK: Total CK: 307 U/L — ABNORMAL HIGH (ref 38–234)

## 2020-03-05 LAB — POTASSIUM
Potassium: 5.8 mmol/L — ABNORMAL HIGH (ref 3.5–5.1)
Potassium: 5 mmol/L (ref 3.5–5.1)
Potassium: 4.6 mmol/L (ref 3.5–5.1)

## 2020-03-05 LAB — CEA: CEA: 4.7 ng/mL (ref 0.0–4.7)

## 2020-03-05 MED ORDER — FUROSEMIDE 10 MG/ML IJ SOLN
40.0000 mg | Freq: Once | INTRAMUSCULAR | Status: AC
  Administered 2020-03-05: 40 mg via INTRAVENOUS
  Filled 2020-03-05: qty 4

## 2020-03-05 MED ORDER — PHENOL 1.4 % MT LIQD: 1.0000 | OROMUCOSAL | Status: DC | PRN

## 2020-03-05 MED ORDER — GABAPENTIN 300 MG PO CAPS
300.0000 mg | ORAL_CAPSULE | Freq: Every day | ORAL | Status: DC
  Administered 2020-03-06: 300 mg via ORAL
  Filled 2020-03-05: qty 1

## 2020-03-05 MED ORDER — ADULT MULTIVITAMIN W/MINERALS CH
1.0000 | ORAL_TABLET | Freq: Every day | ORAL | Status: DC
  Administered 2020-03-05 – 2020-03-06 (×2): 1 via ORAL
  Filled 2020-03-05 (×2): qty 1

## 2020-03-05 MED ORDER — DEXTROSE 50 % IV SOLN
1.0000 | Freq: Once | INTRAVENOUS | Status: AC
  Administered 2020-03-05: 50 mL via INTRAVENOUS
  Filled 2020-03-05: qty 50

## 2020-03-05 MED ORDER — TRAMADOL HCL 50 MG PO TABS
50.0000 mg | ORAL_TABLET | Freq: Four times a day (QID) | ORAL | Status: DC | PRN
  Administered 2020-03-08: 50 mg via ORAL
  Filled 2020-03-05: qty 1

## 2020-03-05 MED ORDER — SODIUM BICARBONATE-DEXTROSE 150-5 MEQ/L-% IV SOLN
150.0000 meq | INTRAVENOUS | Status: DC
  Administered 2020-03-05 – 2020-03-06 (×3): 150 meq via INTRAVENOUS
  Filled 2020-03-05 (×4): qty 1000

## 2020-03-05 MED ORDER — PIPERACILLIN-TAZOBACTAM IN DEX 2-0.25 GM/50ML IV SOLN
2.2500 g | Freq: Three times a day (TID) | INTRAVENOUS | Status: DC
  Administered 2020-03-05 – 2020-03-06 (×3): 2.25 g via INTRAVENOUS
  Filled 2020-03-05 (×4): qty 50

## 2020-03-05 MED ORDER — BOOST / RESOURCE BREEZE PO LIQD CUSTOM
1.0000 | Freq: Two times a day (BID) | ORAL | Status: DC
  Administered 2020-03-05 – 2020-03-06 (×3): 1 via ORAL

## 2020-03-05 MED ORDER — CALCIUM GLUCONATE-NACL 2-0.675 GM/100ML-% IV SOLN
2.0000 g | Freq: Once | INTRAVENOUS | Status: AC
  Administered 2020-03-05: 2000 mg via INTRAVENOUS
  Filled 2020-03-05: qty 100

## 2020-03-05 MED ORDER — ALBUMIN HUMAN 25 % IV SOLN
12.5000 g | Freq: Once | INTRAVENOUS | Status: AC
  Administered 2020-03-05: 12.5 g via INTRAVENOUS
  Filled 2020-03-05: qty 50

## 2020-03-05 MED ORDER — PRO-STAT SUGAR FREE PO LIQD
30.0000 mL | Freq: Three times a day (TID) | ORAL | Status: DC
  Administered 2020-03-05 – 2020-03-11 (×11): 30 mL via ORAL
  Filled 2020-03-05 (×15): qty 30

## 2020-03-05 MED ORDER — SODIUM BICARBONATE 8.4 % IV SOLN: INTRAVENOUS | Status: DC

## 2020-03-05 NOTE — Progress Notes (Signed)
Physical Therapy Treatment Patient Details Name: Tracy [name] MRN: [id number] DOB: June 07, 1951 Today's Date: 03/05/2020    History of Present Illness Tracy [name] is an 69 y.o. female who is here for abd pain,  diarrhea for several weeks. Found to have bowel perforation, perforated viscus.   S/Plow anterior resection end colostomy on 03/03/20    PT Comments    The patient requires encouragement to mobilize with pain complaints. Patient given medications at start of treatment. Patient did assist self with moving to edge of bed and sitting up, hugging pillow for support. Patient stood and took a few steps to recliner. Continue to encourage mobility and ambulation .   Follow Up Recommendations  Home health PT     Equipment Recommendations  None recommended by PT    Recommendations for Other Services       Precautions / Restrictions Precautions Precautions: Fall Precaution Comments: colostomy, right JP Restrictions Weight Bearing Restrictions: No    Mobility  Bed Mobility Overal bed mobility: Needs Assistance Bed Mobility: Rolling;Sidelying to Sit Rolling: Mod assist Sidelying to sit: Mod assist       General bed mobility comments: pt has pillow against abdomen,  using bed pad, assisted to roll, scoot to bed edge and sit upright  Transfers Overall transfer level: Needs assistance Equipment used: 1 person hand held assist Transfers: Sit to/from [employer] Transfers Sit to Stand: Mod assist Stand pivot transfers: Mod assist       General transfer comment: patient hugging pillow throughout. patient stood and took small steps to recliner with 1 HHA.  Ambulation/Gait                 Stairs             Wheelchair Mobility    Modified Rankin (Stroke Patients Only)       Balance Overall balance assessment: Needs assistance Sitting-balance support: Single extremity supported;Feet supported Sitting balance-Leahy Scale: Fair        Standing balance-Leahy Scale: Fair                              Cognition Arousal/Alertness: Awake/alert Behavior During Therapy: WFL for tasks assessed/performed Overall Cognitive Status: Within Functional Limits for tasks assessed                                        Exercises      General Comments        Pertinent Vitals/Pain Pain Assessment: 0-10 Pain Score: 4  Pain Location: abdomen Pain Descriptors / Indicators: Cramping;Grimacing;Guarding Pain Intervention(s): Monitored during session;Limited activity within patient's tolerance;Premedicated before session;Repositioned    Home Living Family/patient expects to be discharged to:: Private residence Living Arrangements: Spouse/significant other Available Help at Discharge: Family Type of Home: House Home Access: Stairs to enter   Home Layout: One level Home Equipment: Grab bars - tub/shower(built in shower seat )      Prior Function Level of Independence: Independent          PT Goals (current goals can now be found in the care plan section) Acute Rehab PT Goals Patient Stated Goal: to go home Progress towards PT goals: Progressing toward goals    Frequency    Min 3X/week      PT Plan Current plan remains appropriate    Co-evaluation  AM-PAC PT "6 Clicks" Mobility   Outcome Measure  Help needed turning from your back to your side while in a flat bed without using bedrails?: A Lot Help needed moving from lying on your back to sitting on the side of a flat bed without using bedrails?: A Lot Help needed moving to and from a bed to a chair (including a wheelchair)?: A Lot Help needed standing up from a chair using your arms (e.g., wheelchair or bedside chair)?: A Lot Help needed to walk in hospital room?: A Lot Help needed climbing 3-5 steps with a railing? : A Lot 6 Click Score: 12    End of Session Equipment Utilized During Treatment:  Oxygen Activity Tolerance: No increased pain;Patient limited by fatigue Patient left: in chair;with call bell/phone within reach;with family/visitor present Nurse Communication: Mobility status PT Visit Diagnosis: Unsteadiness on feet (R26.81);Difficulty in walking, not elsewhere classified (R26.2)     Time: [id number] PT Time Calculation (min) (ACUTE ONLY): 10 min  Charges:  $Therapeutic Activity: 8-22 mins                     Tresa Endo PT Acute Rehabilitation Services Pager [phone] Office [phone]    Tracy [name] 03/05/2020, 1:10 PM

## 2020-03-05 NOTE — Progress Notes (Signed)
Paged [facility] services to notify MD of pt's AMS. Pt suddenly became agitated and removed IV's and wanted to leave facilities. Her cognition soon returned to normal after roughly 10 minutes. Will continue to monitor.

## 2020-03-05 NOTE — Progress Notes (Signed)
PHARMACY NOTE -  ANTIBIOTIC RENAL DOSE ADJUSTMENT   Patient has been initiated on zosyn for IAI. SCr 2.58, estimated CrCl 19 ml/min  Zosyn reduced to 2.25 mg IV q8h for CrCl < 20 ml/min  Eudelia Bunch, Pharm.D [phone] 03/05/2020 7:58 AM

## 2020-03-05 NOTE — Progress Notes (Signed)
2 Days Post-Op   Subjective/Chief Complaint: UOP improved, although still dealing with elevated potassium levels Renal consult has been called Patient had an episode of sundowning - oriented this morning Resting comfortably   Objective: Vital signs in last 24 hours: Temp:  [97.5 F (36.4 C)-99 F (37.2 C)] 98.1 F (36.7 C) (03/23 0400) Pulse Rate:  [96-117] 97 (03/23 0800) Resp:  [7-28] 13 (03/23 0800) BP: (103-172)/(43-84) 126/67 (03/23 0800) SpO2:  [93 %-99 %] 94 % (03/23 0800) Weight:  [64.7 kg] 64.7 kg (03/23 0500) Last BM Date: 03/04/20(per colostomy bag)  Intake/Output from previous day: 03/22 0701 - 03/23 0700 In: 1843 [P.O.:520; I.V.:1181.7; IV Piggyback:141.3] Out: [id number] [Urine:1032; Emesis/NG output:2000; Drains:360] Intake/Output this shift: No intake/output data recorded.  General appearance: alert, cooperative and no distress Resp: clear to auscultation bilaterally Cardio: regular rate and rhythm, S1, S2 normal, no murmur, click, rub or gallop GI: soft, incisional tenderness; wound clean Ostomy pink, viable, brown stool in bag  Lab Results:  Recent Labs    03/04/20 0238 03/05/20 0422  WBC 13.2* 11.6*  HGB 13.1 11.6*  HCT 42.8 38.3  PLT 364 263   BMET Recent Labs    03/04/20 0708 03/04/20 0836 03/05/20 0422 03/05/20 0745  NA 137  --  140  --   K 6.1*   < > 6.0* 5.0  CL 109  --  110  --   CO2 17*  --  15*  --   GLUCOSE 101*  --  97  --   BUN 35*  --  48*  --   CREATININE 1.90*  --  2.58*  --   CALCIUM 7.5*  --  7.8*  --    < > = values in this interval not displayed.   PT/INR No results for input(s): LABPROT, INR in the last 72 hours. ABG No results for input(s): PHART, HCO3 in the last 72 hours.  Invalid input(s): PCO2, PO2  Studies/Results: CT ABDOMEN PELVIS W CONTRAST  Result Date: 03/03/2020 CLINICAL DATA:  Acute generalized abdominal pain. Neutropenia. Question of peritonitis. Severe abdominal pain and nausea since yesterday.  EXAM: CT ABDOMEN AND PELVIS WITH CONTRAST TECHNIQUE: Multidetector CT imaging of the abdomen and pelvis was performed using the standard protocol following bolus administration of intravenous contrast. CONTRAST:  48mL OMNIPAQUE IOHEXOL 300 MG/ML  SOLN COMPARISON:  None. FINDINGS: Lower chest: Panlobular emphysematous changes at the lung bases. The heart size is normal. Hepatobiliary: The gallbladder is present and is distended. No pericholecystic fluid or calcified stones. Common bile duct normal in caliber. Liver is normal in density. A subcapsular oval hyperdense mass is identified within the MEDIAL segment of the LEFT hepatic lobe measuring 1.6 x 1.0 centimeters on image 19 of series 2. Pancreas: Unremarkable. No pancreatic ductal dilatation or surrounding inflammatory changes. Spleen: Normal in size without focal abnormality. Adrenals/Urinary Tract: Adrenal glands are normal in appearance. Renal scarring bilaterally. No hydronephrosis. No ureteral obstruction. Urinary bladder is decompressed. Stomach/Bowel: Small hiatal hernia. Stomach is otherwise unremarkable. Jejunal loops have a thickened wall but appear normal in caliber. There is wall thickening, wall enhancement, and associated inflammatory changes surrounding loops within the pelvis, likely ileal loops. The appendix is well seen and has a normal appearance. There is impacted stool throughout the ascending, transverse colon and splenic flexure. There is abrupt transition of caliber of the colon at the level of the proximal ascending colon where there is transition to attenuated proximal descending colon. In this region there is a collection of  fluid and gas consistent with perforation. This collection measures 4.8 x 6.0 centimeters, seen LATERAL to the LEFT kidney and below the inferior aspect of the spleen. No discrete mass identified. Vascular/Lymphatic: There is atherosclerotic calcification of the abdominal aorta not associated with aneurysm. No  significant abdominal adenopathy. Reproductive: Uterus is present. No adnexal mass. Other: Large amounts of free intraperitoneal air within the UPPER abdomen. Small locule sub gas are identified in the LEFT UPPER QUADRANT, adjacent to the spleen and stomach. Trace amount of ascites within the pelvis. Musculoskeletal: No acute or significant osseous findings. IMPRESSION: 1. Free intraperitoneal air consistent perforation. Most likely site of perforation is in the distal splenic flexure/proximal descending colon where there is a collection of air and gas measuring 6 centimeters. No obvious soft tissue mass identified in this region. At this site, there is abrupt transition of dilated, stool-filled colon to completely decompressed proximal descending colon. 2. Thickened, inflamed loops of jejunum are identified within the pelvis and are likely reactive. 3. Small hiatal hernia. 4. Benign-appearing 1.6 centimeter mass the LEFT hepatic lobe. Recommend comparison with prior studies if available. 5.  Emphysema (ICD10-J43.9). 6.  Aortic Atherosclerosis (ICD10-I70.0). 7. Bilateral renal scarring. These results were called by telephone at the time of interpretation on 03/03/2020 at 2:30 pm to provider [facility] , who verbally acknowledged these results. Electronically Signed   By: Nolon Nations M.D.   On: 03/03/2020 14:35    Anti-infectives: Anti-infectives (From admission, onward)   Start     Dose/Rate Route Frequency Ordered Stop   03/05/20 1400  piperacillin-tazobactam (ZOSYN) IVPB 2.25 g     2.25 g 100 mL/hr over 30 Minutes Intravenous Every 8 hours 03/05/20 0752     03/04/20 2200  piperacillin-tazobactam (ZOSYN) IVPB 3.375 g  Status:  Discontinued     3.375 g 12.5 mL/hr over 240 Minutes Intravenous Every 8 hours 03/03/20 2032 03/05/20 0752   03/03/20 1545  cefoTEtan (CEFOTAN) 2 g in sodium chloride 0.9 % 100 mL IVPB     2 g 200 mL/hr over 30 Minutes Intravenous On call to O.R. 03/03/20 1532 03/04/20  0444   03/03/20 1345  piperacillin-tazobactam (ZOSYN) IVPB 3.375 g     3.375 g 12.5 mL/hr over 240 Minutes Intravenous  Once 03/03/20 1330 03/03/20 1507      Assessment/Plan: Perforated sigmoid colon Proximal rectal cancer with obstruction S/p emergent Low anterior resection/ colostomy by Dr. Marcello Moores 3/21 WOCN education Dressing changes Incentive spirometer Maintain Foley today to monitor UOP - renal consult Leave drain in place for now Clamp NG - allow clear liquids ID - Zosyn PT consult  OOB to chair today  LOS: 2 days    Tracy [name] 03/05/2020

## 2020-03-05 NOTE — Progress Notes (Signed)
Occupational Therapy Evaluation Patient Details Name: Tracy [name] MRN: [id number] DOB: [birth year] Today's Date: 03/05/2020    History of Present Illness Tracy [name] is an 69 y.o. female who is here for abd pain,  diarrhea for several weeks. Found to have bowel perforation, perforated viscus.   S/Plow anterior resection end colostomy on 03/03/20   Clinical Impression   Patient reports living with a spouse and performs all self-care tasks and functional mobility with Independence at PLOF. Overall pt requires setup to total assist for self-care tasks and functional mobility with Moderate assist. Pt required verbal cues of encouragement to perform bed mobility. Pt required Moderate assist for bed mobility with use of pillow for abdominal protection. Pt's O2 stats dropped to high 80s during functional transfer to recliner at Moderate A level.  Pt was able to complete grooming tasks while sitting in recliner. Pt will benefit from continued skilled acute OT services.     Follow Up Recommendations  Home health OT    Equipment Recommendations  3 in 1 bedside commode    Recommendations for Other Services       Precautions / Restrictions Precautions Precautions: Fall Precaution Comments: colodtomt, right JP Restrictions Weight Bearing Restrictions: No      Mobility Bed Mobility Overal bed mobility: Needs Assistance Bed Mobility: Rolling;Sidelying to Sit Rolling: Mod assist Sidelying to sit: Mod assist       General bed mobility comments: pt has pillow against abdomen,  using bed pad, assisted to roll, scoot to bed edge and sit upright  Transfers Overall transfer level: Needs assistance Equipment used: 1 person hand held assist Transfers: Sit to/from [employer] Transfers Sit to Stand: Mod assist Stand pivot transfers: Mod assist       General transfer comment: patient hugging pillow throughout. patient stood and took small steps to recliner with 1 HHA.     Balance Overall balance assessment: Needs assistance Sitting-balance support: Single extremity supported;Feet supported Sitting balance-Leahy Scale: Fair       Standing balance-Leahy Scale: Fair                             ADL either performed or assessed with clinical judgement   ADL Overall ADL's : Needs assistance/impaired Eating/Feeding: Set up   Grooming: Wash/dry hands;Wash/dry face;Oral care;Set up   Upper Body Bathing: Minimal assistance   Lower Body Bathing: Maximal assistance   Upper Body Dressing : Minimal assistance   Lower Body Dressing: Maximal assistance   Toilet Transfer: Moderate assistance;+2 for physical assistance;+2 for safety/equipment;BSC   Toileting- Clothing Manipulation and Hygiene: Total assistance   Tub/ Shower Transfer: Moderate assistance;+2 for physical assistance;+2 for safety/equipment   Functional mobility during ADLs: Moderate assistance;+2 for physical assistance;+2 for safety/equipment       Vision Baseline Vision/History: No visual deficits       Perception     Praxis      Pertinent Vitals/Pain Pain Assessment: 0-10 Pain Score: 4  Pain Location: abdomen Pain Descriptors / Indicators: Cramping;Grimacing;Guarding Pain Intervention(s): Limited activity within patient's tolerance     Hand Dominance Right   Extremity/Trunk Assessment Upper Extremity Assessment Upper Extremity Assessment: RUE deficits/detail;LUE deficits/detail RUE: (Grossly 3+/5, AROM WFL) LUE: (Grossly 3+/5, AROM WFL)   Lower Extremity Assessment Lower Extremity Assessment: Defer to PT evaluation       Communication Communication Communication: No difficulties   Cognition Arousal/Alertness: Awake/alert Behavior During Therapy: WFL for tasks assessed/performed Overall Cognitive Status: Within  Functional Limits for tasks assessed                                     General Comments       Exercises     Shoulder  Instructions      Home Living Family/patient expects to be discharged to:: Private residence Living Arrangements: Spouse/significant other Available Help at Discharge: Family Type of Home: House Home Access: Stairs to enter Technical brewer of Steps: 1   Home Layout: One level     Bathroom Shower/Tub: Occupational psychologist: Handicapped height Bathroom Accessibility: Yes How Accessible: Accessible via walker Home Equipment: Grab bars - tub/shower(built in shower seat )          Prior Functioning/Environment Level of Independence: Independent                 OT Problem List: Decreased strength;Decreased activity tolerance;Impaired balance (sitting and/or standing)      OT Treatment/Interventions: Self-care/ADL training;Therapeutic exercise;Energy conservation;DME and/or AE instruction;Therapeutic activities;Patient/family education;Balance training    OT Goals(Current goals can be found in the care plan section) Acute Rehab OT Goals Patient Stated Goal: to go home OT Goal Formulation: With patient Time For Goal Achievement: 03/19/20 Potential to Achieve Goals: Fair  OT Frequency: Min 2X/week   Barriers to D/C:            Co-evaluation              AM-PAC OT "6 Clicks" Daily Activity     Outcome Measure Help from another person eating meals?: A Little Help from another person taking care of personal grooming?: A Little Help from another person toileting, which includes using toliet, bedpan, or urinal?: Total Help from another person bathing (including washing, rinsing, drying)?: A Lot Help from another person to put on and taking off regular upper body clothing?: A Little Help from another person to put on and taking off regular lower body clothing?: A Lot 6 Click Score: 14   End of Session Equipment Utilized During Treatment: Oxygen Nurse Communication: Mobility status  Activity Tolerance: Patient limited by pain Patient left: in  chair;in CPM;with family/visitor present  OT Visit Diagnosis: Unsteadiness on feet (R26.81);Muscle weakness (generalized) (M62.81)                Time: [id number] OT Time Calculation (min): 26 min Charges:  OT General Charges $OT Visit: 1 Visit OT Evaluation $OT Eval Moderate Complexity: 1 Mod  Pairlee Sawtell OTR/L   Kimbery Harwood 03/05/2020, 1:00 PM

## 2020-03-05 NOTE — Progress Notes (Signed)
Initial Nutrition Assessment  RD working remotely.   DOCUMENTATION CODES:   Not applicable  INTERVENTION:  - will order Boost Breeze BID, each supplement provides 250 kcal and 9 grams of protein. - will order 30 mL Prostat TID, each supplement provides 100 kcal and 15 grams of protein. - will order daily multivitamin with minerals. - continue to encourage PO intakes as tolerated and advance diet as medically feasible.    NUTRITION DIAGNOSIS:   Inadequate protein intake related to other (see comment)(current diet order) as evidenced by other (comment)(CLD does not meet estimated protein need.).  GOAL:   Patient will meet greater than or equal to 90% of their needs  MONITOR:   PO intake, Supplement acceptance, Diet advancement, Labs, Weight trends, Skin  REASON FOR ASSESSMENT:   Malnutrition Screening Tool  ASSESSMENT:   69 year-old female with medical history of asthma, COPD, hemorrhoids, osteopenia, vitamin D deficiency, and seasonal allergies. She presented to the ED on 3/20 due to abdominal pain with associated nausea and diarrhea. These symptoms have been ongoing for several months.  Patient is POD #1 emergent low anterior resection and colostomy 2/2 perforated sigmoid colon and proximal rectal cancer with associated obstruction.   R nare NGT was placed on 3/21 at 1640 and remains in place at this time. NGT was clamped for trial earlier this AM and diet was advanced from NPO to CLD today at 0834; no intakes since that time.   Weight on 3/21 was 139 lb and weight this AM is 143 lb. Suspect most of this weight is related to fluid. Weight had previously been stable (127-129 lb) from 07/26/17-10/27/19.    Labs reviewed; BUN: 48 mg/dl, creatinine: 2.58 mg/dl, Ca: 7. 8 mg/dl, GFR: 19 ml/min. Medications reviewed; 12.5 mg x1 dose 3/23, 2 g IV Ca gluconate x1 dose 3/23, 40 mg IV lasix x2 doses 3/23, 250 mg florastor BID. IVF; D5-150 mEq sodium bicarb @ 125 ml/hr (510 kcal).      NUTRITION - FOCUSED PHYSICAL EXAM:  unable to complete at this time.   Diet Order:   Diet Order            Diet clear liquid Room service appropriate? Yes; Fluid consistency: Thin  Diet effective now              EDUCATION NEEDS:   Not appropriate for education at this time  Skin:  Skin Assessment: Skin Integrity Issues: Skin Integrity Issues:: Incisions Incisions: abdomen (3/22)  Last BM:  3/22  Height:   Ht Readings from Last 1 Encounters:  03/03/20 5\' 2"  (1.575 m)    Weight:   Wt Readings from Last 1 Encounters:  03/05/20 64.7 kg    Ideal Body Weight:  50 kg  BMI:  Body mass index is 26.09 kg/m.  Estimated Nutritional Needs:   Kcal:  [id number] kcal  Protein:  120-135 grams  Fluid:  >/= 2.3 L/day     Jarome Matin, MS, RD, LDN, CNSC Inpatient Clinical Dietitian RD pager # available in [facility]  After hours/weekend pager # available in [facility]

## 2020-03-05 NOTE — Consult Note (Addendum)
[facility] Nephrology Consultation Note  Requesting MD: Dr Georgette [place] (Surgery) Reason for consult: AKI and hyperkalemia  HPI:  Tracy [name] is a 69 y.o. female with history of asthma/COPD who presented on 3/21 for severe abdominal pain associated with nausea and diarrhea, found to have perforated distal splenic colon seen as a consultation at the request of surgery team for acute kidney injury associated with hyperkalemia.  Patient underwent low anterior resection in the colostomy surgery on 3/21.  Postoperatively the blood pressure was recorded as low as 81/55 when patient was treated with a liter of LR bolus and maintenance fluid.  Subsequently blood pressure improved. On admission the serum creatinine level was 1.43.  Postoperatively the serum creatinine level elevated to 1.9 associated with potassium level of 6.1.  IV fluid was continued and managed medically.  Today, the labs showed creatinine level of 2.58, CO2 15 and potassium level 6.  She was treated with IV Lasix and started on sodium bicarbonate IV fluid.  Potassium level improved to 4.6 this afternoon.  The urinalysis with no protein however with some bacteria and RBCs.  Urine sodium was less than 10.  Patient is now sitting on chair with NG tube in place.  Noted clear liquid was a started.  Her blood pressure is in acceptable range.  Urine output is recorded as 1032 cc in last 24 hours.  She has indwelling Foley catheter.  She denied nausea, vomiting, chest pain or shortness of breath.  No abdominal pain currently.    Patient reported that she took Aleve for 2 days for her abdominal pain.     Creatinine, Ser  Date/Time Value Ref Range Status  03/05/2020 04:22 AM 2.58 (H) 0.44 - 1.00 mg/dL Final  03/04/2020 07:08 AM 1.90 (H) 0.44 - 1.00 mg/dL Final  03/03/2020 12:14 PM 1.43 (H) 0.44 - 1.00 mg/dL Final  10/04/2012 01:30 PM 0.86 0.50 - 1.10 mg/dL Final     PMHx:   Past Medical History:  Diagnosis Date  . Asthma    . COPD (chronic obstructive pulmonary disease) ([place]) 2011   FeV1 31% predicted FeV1/FVX 47 %  . Hemorrhoid   . Osteopenia   . Seasonal allergies    takes Claritin daily prn  . Vitamin D deficiency    takes Vit d every 14 days    Past Surgical History:  Procedure Laterality Date  . AUGMENTATION MAMMAPLASTY     saline  . BREAST ENHANCEMENT SURGERY    . EXAMINATION UNDER ANESTHESIA  10/14/2012   Procedure: EXAM UNDER ANESTHESIA;  Surgeon: Gayland Curry, MD,FACS;  Location: [place];  Service: General;  Laterality: N/A;  rectal exam under anesthesia excisional hemorrhoidectomy hemorrhoidal banding x two  . EXAMINATION UNDER ANESTHESIA  02/03/2013   excision hemorrhoidal tissue  . FOOT SURGERY     left bunionectomy  . HEMORRHOIDECTOMY WITH HEMORRHOID BANDING  10/14/2012   Procedure: [id number] WITH HEMORRHOID BANDING;  Surgeon: Gayland Curry, MD,FACS;  Location: [place];  Service: General;  Laterality: N/A;  . LAPAROTOMY N/A 03/03/2020   Procedure: low anterior resection end colostomy;  Surgeon: [name], MD;  Location: [facility];  Service: General;  Laterality: N/A;  . OPEN REDUCTION INTERNAL FIXATION (ORIF) DISTAL RADIAL FRACTURE Right 11/01/2018   Procedure: OPEN REDUCTION INTERNAL FIXATION (ORIF) DISTAL RADIAL FRACTURE;  Surgeon: [name], MD;  Location: [place];  Service: Orthopedics;  Laterality: Right;  . TONSILLECTOMY  age 30    recurrent otitis media  Family Hx:  Family History  Problem Relation Age of Onset  . Osteoporosis Mother   . Other Father        [place] in snow, broke ankle, blood clot to lungs  . Heart murmur Sister   . Other Brother        h/o being hit by lightening  . Parkinsonism Brother   . Asthma Son   . Allergies Son   . Otitis media Son   . Other Sister        CML  . Asthma Son   . Allergies Son   . Pancreatic cancer Brother        diag 12-18-13-deceased Feb 17, 2014    Social History:  reports that she quit smoking about 20  years ago. Her smoking use included cigarettes. She has a 10.00 pack-year smoking history. She has never used smokeless tobacco. She reports current alcohol use. She reports that she does not use drugs.  Allergies:  Allergies  Allergen Reactions  . Codeine Swelling    Medications: Prior to Admission medications   Medication Sig Start Date End Date Taking? Authorizing Provider  acetaminophen (TYLENOL) 325 MG tablet Take 650 mg by mouth every 6 (six) hours as needed for moderate pain.   Yes [provider]  albuterol (PROVENTIL) (2.5 MG/3ML) 0.083% nebulizer solution INHALE 3ML VIA NEBULIZER EVERY 4 HOURS AS NEEDED FOR SHORTNESS OF BREATH 11/23/19  Yes Icard, Bradley L, DO  b complex vitamins tablet Take 1 tablet by mouth daily.   Yes [provider]  Fluticasone-Umeclidin-Vilant (TRELEGY ELLIPTA) 100-62.5-25 MCG/INH AEPB Inhale 1 puff into the lungs daily. 02/15/20  Yes Icard, Bradley L, DO  VITAMIN D, CHOLECALCIFEROL, PO Take 1 tablet by mouth daily.   Yes [provider]  HYDROcodone-acetaminophen (NORCO) 5-325 MG tablet 1-2 tabs po q6 hours prn pain Patient not taking: Reported on 03/03/2020 11/01/18   [name], MD  loratadine-pseudoephedrine (CLARITIN-D 24 HOUR) 10-240 MG 24 hr tablet Take 1 tablet by mouth daily. Patient not taking: Reported on 03/03/2020 07/24/19   June Leap L, DO  Tiotropium Bromide-Olodaterol (STIOLTO RESPIMAT) 2.5-2.5 MCG/ACT AERS Inhale 2 puffs into the lungs daily. Patient not taking: Reported on 03/03/2020 10/10/18   Noralee Space, MD    I have reviewed the patient's current medications.  Labs:  Results for orders placed or performed during the hospital encounter of 03/03/20 (from the past 48 hour(s))  Respiratory Panel by RT PCR (Flu A&B, Covid) - Nasopharyngeal Swab     Status: None   Collection Time: 03/03/20  1:55 PM   Specimen: Nasopharyngeal Swab  Result Value Ref Range   SARS Coronavirus 2 by RT PCR NEGATIVE NEGATIVE     Comment: (NOTE) SARS-CoV-2 target nucleic acids are NOT DETECTED. The SARS-CoV-2 RNA is generally detectable in upper respiratoy specimens during the acute phase of infection. The lowest concentration of SARS-CoV-2 viral copies this assay can detect is 131 copies/mL. A negative result does not preclude SARS-Cov-2 infection and should not be used as the sole basis for treatment or other patient management decisions. A negative result may occur with  improper specimen collection/handling, submission of specimen other than nasopharyngeal swab, presence of viral mutation(s) within the areas targeted by this assay, and inadequate number of viral copies (<131 copies/mL). A negative result must be combined with clinical observations, patient history, and epidemiological information. The expected result is Negative. Fact Sheet for Patients:  PinkCheek.be Fact Sheet for Healthcare Providers:  GravelBags.it This test is not yet  ap proved or cleared by the [place] and  has been authorized for detection and/or diagnosis of SARS-CoV-2 by FDA under an Emergency Use Authorization (EUA). This EUA will remain  in effect (meaning this test can be used) for the duration of the COVID-19 declaration under Section 564(b)(1) of the Act, 21 U.S.C. section 360bbb-3(b)(1), unless the authorization is terminated or revoked sooner.    Influenza A by PCR NEGATIVE NEGATIVE   Influenza B by PCR NEGATIVE NEGATIVE    Comment: (NOTE) The Xpert Xpress SARS-CoV-2/FLU/RSV assay is intended as an aid in  the diagnosis of influenza from Nasopharyngeal swab specimens and  should not be used as a sole basis for treatment. Nasal washings and  aspirates are unacceptable for Xpert Xpress SARS-CoV-2/FLU/RSV  testing. Fact Sheet for Patients: PinkCheek.be Fact Sheet for Healthcare  Providers: GravelBags.it This test is not yet approved or cleared by the [place] FDA and  has been authorized for detection and/or diagnosis of SARS-CoV-2 by  FDA under an Emergency Use Authorization (EUA). This EUA will remain  in effect (meaning this test can be used) for the duration of the  Covid-19 declaration under Section 564(b)(1) of the Act, 21  U.S.C. section 360bbb-3(b)(1), unless the authorization is  terminated or revoked. Performed at [facility], [place] [address], [place], [place] [address]   MRSA PCR Screening     Status: None   Collection Time: 03/03/20  8:38 PM   Specimen: Nasal Mucosa; Nasopharyngeal  Result Value Ref Range   MRSA by PCR NEGATIVE NEGATIVE    Comment:        The GeneXpert MRSA Assay (FDA approved for NASAL specimens only), is one component of a comprehensive MRSA colonization surveillance program. It is not intended to diagnose MRSA infection nor to guide or monitor treatment for MRSA infections. Performed at [facility], [place] [address], [place], [place] [address]   Glucose, capillary     Status: None   Collection Time: 03/03/20  8:58 PM  Result Value Ref Range   Glucose-Capillary 97 70 - 99 mg/dL    Comment: Glucose reference range applies only to samples taken after fasting for at least 8 hours.  CEA     Status: None   Collection Time: 03/04/20  2:38 AM  Result Value Ref Range   CEA 4.7 0.0 - 4.7 ng/mL    Comment: (NOTE)                             Nonsmokers          <3.9                             Smokers             <5.6 Roche Diagnostics Electrochemiluminescence Immunoassay (ECLIA) Values obtained with different assay methods or kits cannot be used interchangeably.  Results cannot be interpreted as absolute evidence of the presence or absence of malignant disease. Performed At: [facility] [place], [place] [id number] Rush Farmer  MD [id number]   CBC     Status: Abnormal   Collection Time: 03/04/20  2:38 AM  Result Value Ref Range   WBC 13.2 (H) 4.0 - 10.5 K/uL   RBC 4.17 3.87 - 5.11 MIL/uL   Hemoglobin 13.1 12.0 - 15.0 g/dL   HCT 42.8 36.0 - 46.0 %  MCV 102.6 (H) 80.0 - 100.0 fL   MCH 31.4 26.0 - 34.0 pg   MCHC 30.6 30.0 - 36.0 g/dL   RDW 12.4 11.5 - 15.5 %   Platelets 364 150 - 400 K/uL   nRBC 0.0 0.0 - 0.2 %    Comment: Performed at [facility], [place] [address] [place], [place] [id number]  Basic metabolic panel     Status: Abnormal   Collection Time: 03/04/20  7:08 AM  Result Value Ref Range   Sodium 137 135 - 145 mmol/L   Potassium 6.1 (H) 3.5 - 5.1 mmol/L    Comment: DELTA CHECK NOTED NO VISIBLE HEMOLYSIS    Chloride 109 98 - 111 mmol/L   CO2 17 (L) 22 - 32 mmol/L   Glucose, Bld 101 (H) 70 - 99 mg/dL    Comment: Glucose reference range applies only to samples taken after fasting for at least 8 hours.   BUN 35 (H) 8 - 23 mg/dL   Creatinine, Ser 1.90 (H) 0.44 - 1.00 mg/dL   Calcium 7.5 (L) 8.9 - 10.3 mg/dL   GFR calc non Af Amer 27 (L) >60 mL/min   GFR calc Af Amer 31 (L) >60 mL/min   Anion gap 11 5 - 15    Comment: Performed at [facility], [place] [address], [place], [place] [address]  Lactic acid, plasma     Status: Abnormal   Collection Time: 03/04/20  7:58 AM  Result Value Ref Range   Lactic Acid, Venous 2.5 (HH) 0.5 - 1.9 mmol/L    Comment: CRITICAL VALUE NOTED.  VALUE IS CONSISTENT WITH PREVIOUSLY REPORTED AND CALLED VALUE. Performed at [facility], [place] [address] [place], [place] [address]   Potassium     Status: Abnormal   Collection Time: 03/04/20  8:36 AM  Result Value Ref Range   Potassium 6.1 (H) 3.5 - 5.1 mmol/L    Comment: Performed at [facility], [place] [address] [place], [place] [address]  Na and K (sodium & potassium), rand urine     Status: None   Collection Time: 03/04/20  9:12 AM  Result  Value Ref Range   Sodium, Ur <10 mmol/L   Potassium Urine 69 mmol/L    Comment: Performed at [facility], [place] [address] [place], [place] [address]  Potassium     Status: Abnormal   Collection Time: 03/04/20 11:59 AM  Result Value Ref Range   Potassium 5.4 (H) 3.5 - 5.1 mmol/L    Comment: Performed at [facility], [place] [address] [place], [place] [address]  Potassium     Status: Abnormal   Collection Time: 03/04/20  4:03 PM  Result Value Ref Range   Potassium 5.7 (H) 3.5 - 5.1 mmol/L    Comment: Performed at [facility], [place] [address] [place], [place] [address]  Potassium     Status: Abnormal   Collection Time: 03/04/20  8:11 PM  Result Value Ref Range   Potassium 5.6 (H) 3.5 - 5.1 mmol/L    Comment: Performed at [facility], [place] [address], [place], [place] [address]  Potassium     Status: Abnormal   Collection Time: 03/05/20 12:24 AM  Result Value Ref Range   Potassium 5.8 (H) 3.5 - 5.1 mmol/L    Comment: Performed at [facility], [place] [address] [place], [place] [address]  CBC     Status: Abnormal  Collection Time: 03/05/20  4:22 AM  Result Value Ref Range   WBC 11.6 (H) 4.0 - 10.5 K/uL   RBC 3.62 (L) 3.87 - 5.11 MIL/uL   Hemoglobin 11.6 (L) 12.0 - 15.0 g/dL   HCT 38.3 36.0 - 46.0 %   MCV 105.8 (H) 80.0 - 100.0 fL   MCH 32.0 26.0 - 34.0 pg   MCHC 30.3 30.0 - 36.0 g/dL   RDW 12.6 11.5 - 15.5 %   Platelets 263 150 - 400 K/uL   nRBC 0.2 0.0 - 0.2 %    Comment: Performed at [facility], [place] [address] [place], [place] [id number]  Basic metabolic panel     Status: Abnormal   Collection Time: 03/05/20  4:22 AM  Result Value Ref Range   Sodium 140 135 - 145 mmol/L   Potassium 6.0 (H) 3.5 - 5.1 mmol/L   Chloride 110 98 - 111 mmol/L   CO2 15 (L) 22 - 32 mmol/L   Glucose, Bld 97 70 - 99 mg/dL    Comment: Glucose reference range applies only to samples  taken after fasting for at least 8 hours.   BUN 48 (H) 8 - 23 mg/dL   Creatinine, Ser 2.58 (H) 0.44 - 1.00 mg/dL   Calcium 7.8 (L) 8.9 - 10.3 mg/dL   GFR calc non Af Amer 19 (L) >60 mL/min   GFR calc Af Amer 22 (L) >60 mL/min   Anion gap 15 5 - 15    Comment: Performed at [facility], [place] [address], [place], [place] [address]  Potassium     Status: None   Collection Time: 03/05/20  7:45 AM  Result Value Ref Range   Potassium 5.0 3.5 - 5.1 mmol/L    Comment: Performed at [facility], [place] [address], [place], [place] [address]  Urinalysis, Routine w reflex microscopic     Status: Abnormal   Collection Time: 03/05/20  9:07 AM  Result Value Ref Range   Color, Urine YELLOW YELLOW   APPearance CLEAR CLEAR   Specific Gravity, Urine 1.008 1.005 - 1.030   pH 5.0 5.0 - 8.0   Glucose, UA NEGATIVE NEGATIVE mg/dL   Hgb urine dipstick MODERATE (A) NEGATIVE   Bilirubin Urine NEGATIVE NEGATIVE   Ketones, ur NEGATIVE NEGATIVE mg/dL   Protein, ur NEGATIVE NEGATIVE mg/dL   Nitrite NEGATIVE NEGATIVE   Leukocytes,Ua NEGATIVE NEGATIVE   RBC / HPF 6-10 0 - 5 RBC/hpf   WBC, UA 0-5 0 - 5 WBC/hpf   Bacteria, UA RARE (A) NONE SEEN   Mucus PRESENT     Comment: Performed at [facility], [place] [address] [place], [place] [address]  Potassium     Status: None   Collection Time: 03/05/20 12:10 PM  Result Value Ref Range   Potassium 4.6 3.5 - 5.1 mmol/L    Comment: Performed at [facility], [place] [address], [place], [place] [address]     ROS:  Pertinent items noted in HPI and remainder of comprehensive ROS otherwise negative.  Physical Exam: Vitals:   03/05/20 1100 03/05/20 1200  BP: 137/72 (!) 124/48  Pulse: 94 92  Resp: (!) 22 15  Temp:    SpO2: 96% 96%     General exam: Sitting on chair, has NG tube, not in distress Respiratory system: Clear to auscultation. Respiratory effort normal. No wheezing or  crackle Cardiovascular system: S1 & S2 heard, RRR.  No pedal edema. Gastrointestinal system:  Abdomen is nondistended, soft and has colostomy bag. Central nervous system: Alert awake and following commands.  Extremities: No edema or cyanosis Skin: No rashes, lesions or ulcers Hematology: No bleeding or rash noted.  Assessment/Plan:  #Acute kidney injury likely hemodynamically mediated in the setting of hypotension, recent abdominal surgery and use of NSAIDs.  Patient is nonoliguric. Urinalysis with no proteinuria, few bacteria and RBCs. Urine sodium less than 10 Recent CT scan showed bilateral renal scarring without hydronephrosis or obstruction. I will check CK level.  Repeat renal panel this afternoon.  Continue IV fluid, strict ins and out and avoid hypotensive episode.  I expect that the cr level should plateau in next 1 to 2 days. I will lower gabapentin to qhs (now receiving BID).  #Hyperkalemia due to AKI and the use of LR IVF postoperatively. Received IV Lasix today with improvement in serum potassium level already.  Sodium bicarbonate should help with potassium cellular shifting.  I will discontinue frequent potassium check.  #Metabolic acidosis mostly due to colostomy output and renal failure: Continue IV sodium bicarbonate.  Monitor lab.   #Hypotension: Blood pressure now improved.  Not on pressor.  #Perforated sigmoid colon, proximal rectal cancer with obstruction: Status post emergent low anterior resection/colostomy.  As per general surgery team.  I have discussed with the patient's husband and son at bedside.  Thank you for the consult, we will follow with you.  Jaycee Mckellips Tanna Furry 03/05/2020, 12:43 PM  [facility].

## 2020-03-05 NOTE — Consult Note (Signed)
[place] Nurse ostomy consult note Stoma type/location: LLQ colostomy (Dr. Marcello Moores, 3/21). Seen today for extended visit with patient's husband who is very engaged and participates fully in teaching session. Patient is adamant that she "cannot do this". Stomal assessment/size: 1 and 1/4 inches, round, slightly long, dusky, moist, os at center. Most distal tip may slough, stoma base is viable. Peristomal assessment: intact Treatment options for stomal/peristomal skin: skin barrier ring Output: liquid to soft brown stool with strong odor. Ostomy pouching: 2pc. 2 and 1/4 inch pouching system with skin barrier ring Education provided:  Explained role of ostomy nurse and creation of stoma  Explained stoma characteristics (budded, flush, color, texture, care) Demonstrated pouch change (cutting new skin barrier, measuring stoma, cleaning peristomal skin and stoma, use of barrier ring) Education on emptying when 1/3 to 1/2 full and how to empty Demonstrated "burping" flatus from pouch Demonstrated use of wick to clean spout   Patient's husband is able to give a return demonstration of Lock and Roll closure today.  Answered patient/husband questions to their expressed satisfaction.  [place] nursing team will follow along with you, and will remain available to this patient, the nursing, surgical and medical teams.   Thanks, Maudie Flakes, MSN, RN, [place], Arther Abbott  Pager# [phone]      Enrolled patient in [place] program: Yes, today

## 2020-03-06 ENCOUNTER — Inpatient Hospital Stay (HOSPITAL_COMMUNITY): Payer: Medicare HMO

## 2020-03-06 LAB — CBC
HCT: 36.5 % (ref 36.0–46.0)
Hemoglobin: 11.5 g/dL — ABNORMAL LOW (ref 12.0–15.0)
MCH: 31.5 pg (ref 26.0–34.0)
MCHC: 31.5 g/dL (ref 30.0–36.0)
MCV: 100 fL (ref 80.0–100.0)
Platelets: 227 10*3/uL (ref 150–400)
RBC: 3.65 MIL/uL — ABNORMAL LOW (ref 3.87–5.11)
RDW: 12.6 % (ref 11.5–15.5)
WBC: 15.1 10*3/uL — ABNORMAL HIGH (ref 4.0–10.5)
nRBC: 0 % (ref 0.0–0.2)

## 2020-03-06 LAB — BASIC METABOLIC PANEL
Anion gap: 13 (ref 5–15)
BUN: 46 mg/dL — ABNORMAL HIGH (ref 8–23)
CO2: 25 mmol/L (ref 22–32)
Calcium: 7.9 mg/dL — ABNORMAL LOW (ref 8.9–10.3)
Chloride: 98 mmol/L (ref 98–111)
Creatinine, Ser: 2.11 mg/dL — ABNORMAL HIGH (ref 0.44–1.00)
GFR calc Af Amer: 28 mL/min — ABNORMAL LOW (ref >60)
GFR calc non Af Amer: 24 mL/min — ABNORMAL LOW (ref >60)
Glucose, Bld: 199 mg/dL — ABNORMAL HIGH (ref 70–99)
Potassium: 3.5 mmol/L (ref 3.5–5.1)
Sodium: 136 mmol/L (ref 135–145)

## 2020-03-06 MED ORDER — ALBUTEROL SULFATE (2.5 MG/3ML) 0.083% IN NEBU
2.5000 mg | INHALATION_SOLUTION | Freq: Two times a day (BID) | RESPIRATORY_TRACT | Status: DC
  Administered 2020-03-06 – 2020-03-07 (×3): 2.5 mg via RESPIRATORY_TRACT
  Filled 2020-03-06 (×4): qty 3

## 2020-03-06 MED ORDER — PIPERACILLIN-TAZOBACTAM 3.375 G IVPB
3.3750 g | Freq: Three times a day (TID) | INTRAVENOUS | Status: DC
  Administered 2020-03-06 – 2020-03-11 (×15): 3.375 g via INTRAVENOUS
  Filled 2020-03-06 (×15): qty 50

## 2020-03-06 MED ORDER — SODIUM CHLORIDE 0.9 % IV SOLN: INTRAVENOUS | Status: DC

## 2020-03-06 MED ORDER — ALBUTEROL SULFATE (2.5 MG/3ML) 0.083% IN NEBU
2.5000 mg | INHALATION_SOLUTION | Freq: Four times a day (QID) | RESPIRATORY_TRACT | Status: DC
  Administered 2020-03-06: 2.5 mg via RESPIRATORY_TRACT
  Filled 2020-03-06: qty 3

## 2020-03-06 NOTE — Progress Notes (Signed)
PHARMACY NOTE -  ANTIBIOTIC RENAL DOSE ADJUSTMENT   Patient has been initiated on zosyn for IAI. SCr 2.11 estimated CrCl 23 ml/min  Zosyn increased  to 3.375 mg IV q8h infuse each dose over 4 hours for CrCl > 20 ml/min  Eudelia Bunch, Pharm.D [phone] 03/06/2020 7:39 AM

## 2020-03-06 NOTE — Progress Notes (Signed)
[facility] Progress Note  3 Days Post-Op  Subjective: Husband is at bedside this AM. Patient was sleeping but awakens easily and said she slept very well. Abdominal pain well controlled. Patient was able to get up with therapies yesterday. Having output from stoma, tolerating CLD.   Objective: Vital signs in last 24 hours: Temp:  [97.7 F (36.5 C)-100.3 F (37.9 C)] 98.9 F (37.2 C) (03/24 0327) Pulse Rate:  [87-101] 96 (03/24 0600) Resp:  [7-26] 7 (03/24 0700) BP: (89-137)/(48-85) 89/59 (03/24 0700) SpO2:  [93 %-100 %] 100 % (03/24 0600) Last BM Date: 03/05/20  Intake/Output from previous day: 03/23 0701 - 03/24 0700 In: 2713 [I.V.:2619.8; IV Piggyback:93.3] Out: 3000 [Urine:2400; Drains:200] Intake/Output this shift: No intake/output data recorded.  PE: General: pleasant, NAD, lethargic but very easily rousable  Heart: regular, rate, and rhythm.  Normal s1,s2. No obvious murmurs, gallops, or rubs noted.  Palpable radial and pedal pulses bilaterally Lungs: diminished slightly bilaterally, tachypnea Abd: soft, NT, ND, +BS, stoma ischemic looking superficially but functioning, drain with SS fluid, midline wound clean without purulence    Lab Results:  Recent Labs    03/05/20 0422 03/06/20 0236  WBC 11.6* 15.1*  HGB 11.6* 11.5*  HCT 38.3 36.5  PLT 263 227   BMET Recent Labs    03/05/20 1446 03/06/20 0236  NA 136 136  K 3.8 3.5  CL 102 98  CO2 21* 25  GLUCOSE 241* 199*  BUN 45* 46*  CREATININE 2.29* 2.11*  CALCIUM 7.9* 7.9*   PT/INR No results for input(s): LABPROT, INR in the last 72 hours. CMP     Component Value Date/Time   NA 136 03/06/2020 0236   K 3.5 03/06/2020 0236   CL 98 03/06/2020 0236   CO2 25 03/06/2020 0236   GLUCOSE 199 (H) 03/06/2020 0236   BUN 46 (H) 03/06/2020 0236   CREATININE 2.11 (H) 03/06/2020 0236   CALCIUM 7.9 (L) 03/06/2020 0236   PROT 7.1 03/03/2020 1214   ALBUMIN 2.4 (L) 03/05/2020 1446   AST 23 03/03/2020  1214   ALT 13 03/03/2020 1214   ALKPHOS 67 03/03/2020 1214   BILITOT 1.4 (H) 03/03/2020 1214   GFRNONAA 24 (L) 03/06/2020 0236   GFRAA 28 (L) 03/06/2020 0236   Lipase     Component Value Date/Time   LIPASE 18 03/03/2020 1214       Studies/Results: No results found.  Anti-infectives: Anti-infectives (From admission, onward)   Start     Dose/Rate Route Frequency Ordered Stop   03/06/20 1200  piperacillin-tazobactam (ZOSYN) IVPB 3.375 g     3.375 g 12.5 mL/hr over 240 Minutes Intravenous Every 8 hours 03/06/20 0739     03/05/20 1400  piperacillin-tazobactam (ZOSYN) IVPB 2.25 g  Status:  Discontinued     2.25 g 100 mL/hr over 30 Minutes Intravenous Every 8 hours 03/05/20 0752 03/06/20 0739   03/04/20 2200  piperacillin-tazobactam (ZOSYN) IVPB 3.375 g  Status:  Discontinued     3.375 g 12.5 mL/hr over 240 Minutes Intravenous Every 8 hours 03/03/20 2032 03/05/20 0752   03/03/20 1545  cefoTEtan (CEFOTAN) 2 g in sodium chloride 0.9 % 100 mL IVPB     2 g 200 mL/hr over 30 Minutes Intravenous On call to O.R. 03/03/20 1532 03/04/20 0444   03/03/20 1345  piperacillin-tazobactam (ZOSYN) IVPB 3.375 g     3.375 g 12.5 mL/hr over 240 Minutes Intravenous  Once 03/03/20 1330 03/03/20 1507       Assessment/Plan  COPD AKI - Cr 2.11 from 2.58 yesterday AM, UOP good, hyperkalemia resolved; appreciate renal recommendations  Perforated sigmoid colon Proximal rectal cancer with obstruction S/p emergent Low anterior resection/ colostomy by Dr. Marcello Moores 3/21 - POD#3 - continue daily dressing changes - WOC following for ostomy teaching - stoma appears necrotic but functioning currently, watch closely  - WBC 15, afebrile, BP soft - if remains up, may need repeat CT - drain with SS fluid, leave for now - encourage IS - continue to mobilize   FEN - advance to FLD, IVF VTE - SCDs, lovenox ID - Zosyn 3/21>>  LOS: 3 days    Brigid Re , [facility] Surgery 03/06/2020, 7:57  AM Please see Amion for pager number during day hours 7:00am-4:30pm

## 2020-03-06 NOTE — Progress Notes (Signed)
[facility] NEPHROLOGY PROGRESS NOTE  Assessment/ Plan: Pt is a 69 y.o. yo female  with h/o asthma/COPD who presented on 3/21 for severe abdominal pain associated with N/V found to have perforated distal splenic colon seen as a consultation for acute kidney injury associated with hyperkalemia.  #Acute kidney injury likely hemodynamically mediated in the setting of hypotension, recent abdominal surgery and use of NSAIDs.  Patient is nonoliguric. Urinalysis with no proteinuria, few bacteria and RBCs. Urine Na <10. Recent CT scan showed bilateral renal scarring without hydronephrosis or obstruction. CK level unremarkable. Improvement of urine output and serum creatinine level trended down to 2.1 from 2.5 yesterday.  Potassium level improved.  Reduced IV fluid to 50 cc/hour as maintenance.  Patient still has poor oral intake.  Foley catheter was removed, recommend bladder scan.  #Hyperkalemia due to AKI and the use of LR IVF postoperatively.  Treated medically and now potassium level improved.  #Metabolic acidosis mostly due to colostomy output and renal failure: Improved with IV sodium bicarbonate which is discontinued now.  Continue to monitor.  #Hypotension: Blood pressure now improved.  Not on pressor.  #Perforated sigmoid colon, proximal rectal cancer with obstruction: Status post emergent low anterior resection/colostomy.  As per general surgery team.  Currently on clear liquid diet.  I have reduced the dose of Neurontin to once a day because of low GFR.  Subjective: Seen and examined at bedside.  Patient was sitting on chair comfortable.  Denies nausea vomiting chest pain shortness of breath.  The Foley catheter was removed and reported 1 episode of urine output.  She had 2400 cc of urine output in last 24 hours.  Her husband at bedside. Objective Vital signs in last 24 hours: Vitals:   03/06/20 1000 03/06/20 1100 03/06/20 1200 03/06/20 1300  BP: (!) 89/67 116/62 101/72  110/65  Pulse: (!) 102 (!) 101 (!) 103 (!) 102  Resp: 16 19 (!) 26 20  Temp:   98 F (36.7 C)   TempSrc:   Oral   SpO2: 94% 97% 100% 100%  Weight:      Height:       Weight change:   Intake/Output Summary (Last 24 hours) at 03/06/2020 1349 Last data filed at 03/06/2020 1000 Gross per 24 hour  Intake 2713.04 ml  Output 1525 ml  Net 1188.04 ml       Labs: Basic Metabolic Panel: Recent Labs  Lab 03/05/20 0422 03/05/20 0745 03/05/20 1210 03/05/20 1446 03/06/20 0236  NA 140  --   --  136 136  K 6.0*   < > 4.6 3.8 3.5  CL 110  --   --  102 98  CO2 15*  --   --  21* 25  GLUCOSE 97  --   --  241* 199*  BUN 48*  --   --  45* 46*  CREATININE 2.58*  --   --  2.29* 2.11*  CALCIUM 7.8*  --   --  7.9* 7.9*  PHOS  --   --   --  4.7*  --    < > = values in this interval not displayed.   Liver Function Tests: Recent Labs  Lab 03/03/20 1214 03/05/20 1446  AST 23  --   ALT 13  --   ALKPHOS 67  --   BILITOT 1.4*  --   PROT 7.1  --   ALBUMIN 3.9 2.4*   Recent Labs  Lab 03/03/20 1214  LIPASE 18   No  results for input(s): AMMONIA in the last 168 hours. CBC: Recent Labs  Lab 03/03/20 1214 03/03/20 1214 03/04/20 0238 03/05/20 0422 03/06/20 0236  WBC 16.2*   < > 13.2* 11.6* 15.1*  NEUTROABS 15.3*  --   --   --   --   HGB 14.8   < > 13.1 11.6* 11.5*  HCT 46.8*   < > 42.8 38.3 36.5  MCV 101.3*  --  102.6* 105.8* 100.0  PLT 322   < > 364 263 227   < > = values in this interval not displayed.   Cardiac Enzymes: Recent Labs  Lab 03/05/20 1446  CKTOTAL 307*   CBG: Recent Labs  Lab 03/03/20 2058  GLUCAP 97    Iron Studies: No results for input(s): IRON, TIBC, TRANSFERRIN, FERRITIN in the last 72 hours. Studies/Results: DG CHEST PORT 1 VIEW  Result Date: 03/06/2020 CLINICAL DATA:  Shortness of breath. History of asthma/COPD. EXAM: PORTABLE CHEST 1 VIEW COMPARISON:  Radiograph 10/27/2019 FINDINGS: Patient is rotated. Heart is normal in size. Unchanged  mediastinal contours allowing for differences in technique. Paucity of lung markings suggesting emphysema. Ill-defined patchy opacity in the suprahilar lungs, right greater than left. Linear opacity in the right midlung zone may represent atelectasis or fluid in the fissure. No pneumothorax or large pleural effusion. No acute osseous abnormalities are seen. IMPRESSION: Ill-defined patchy opacity in the suprahilar lungs, right greater than left. This may represent pneumonia, atelectasis/partial lobar collapse, or less likely cephalization of pulmonary vasculature. Recommend follow-up after course of treatment to document resolution. Paucity of lung markings suggesting emphysema. Electronically Signed   By: Keith Rake M.D.   On: 03/06/2020 10:12    Medications: Infusions: . sodium chloride    . piperacillin-tazobactam (ZOSYN)  IV 3.375 g (03/06/20 1212)    Scheduled Medications: . albuterol  2.5 mg Nebulization Q6H  . Chlorhexidine Gluconate Cloth  6 each Topical Daily  . enoxaparin (LOVENOX) injection  30 mg Subcutaneous Q24H  . feeding supplement  1 Container Oral BID BM  . feeding supplement (PRO-STAT SUGAR FREE 64)  30 mL Oral TID BM  . gabapentin  300 mg Oral QHS  . umeclidinium bromide  1 puff Inhalation Daily   And  . mometasone-formoterol  2 puff Inhalation BID  . multivitamin with minerals  1 tablet Oral Daily  . saccharomyces boulardii  250 mg Oral BID    have reviewed scheduled and prn medications.  Physical Exam: General:NAD, comfortable Heart:RRR, s1s2 nl Lungs:clear b/l, no crackle Abdomen:soft,  non-distended, has colostomy bag. Extremities:No LE edema Neurology: Alert, awake, following commands.  Dron Tanna Furry 03/06/2020,1:49 PM  LOS: 3 days  Pager: ID:[phone]

## 2020-03-07 ENCOUNTER — Inpatient Hospital Stay (HOSPITAL_COMMUNITY): Payer: Medicare HMO

## 2020-03-07 LAB — CBC
HCT: 37.5 % (ref 36.0–46.0)
Hemoglobin: 12 g/dL (ref 12.0–15.0)
MCH: 31.6 pg (ref 26.0–34.0)
MCHC: 32 g/dL (ref 30.0–36.0)
MCV: 98.7 fL (ref 80.0–100.0)
Platelets: 251 10*3/uL (ref 150–400)
RBC: 3.8 MIL/uL — ABNORMAL LOW (ref 3.87–5.11)
RDW: 12.3 % (ref 11.5–15.5)
WBC: 17.7 10*3/uL — ABNORMAL HIGH (ref 4.0–10.5)
nRBC: 0.1 % (ref 0.0–0.2)

## 2020-03-07 LAB — BASIC METABOLIC PANEL
Anion gap: 10 (ref 5–15)
BUN: 41 mg/dL — ABNORMAL HIGH (ref 8–23)
CO2: 29 mmol/L (ref 22–32)
Calcium: 8 mg/dL — ABNORMAL LOW (ref 8.9–10.3)
Chloride: 94 mmol/L — ABNORMAL LOW (ref 98–111)
Creatinine, Ser: 1.37 mg/dL — ABNORMAL HIGH (ref 0.44–1.00)
GFR calc Af Amer: 46 mL/min — ABNORMAL LOW (ref >60)
GFR calc non Af Amer: 40 mL/min — ABNORMAL LOW (ref >60)
Glucose, Bld: 131 mg/dL — ABNORMAL HIGH (ref 70–99)
Potassium: 3 mmol/L — ABNORMAL LOW (ref 3.5–5.1)
Sodium: 133 mmol/L — ABNORMAL LOW (ref 135–145)

## 2020-03-07 LAB — SURGICAL PATHOLOGY

## 2020-03-07 MED ORDER — POTASSIUM CHLORIDE 10 MEQ/100ML IV SOLN
INTRAVENOUS | Status: AC
  Filled 2020-03-07: qty 100

## 2020-03-07 MED ORDER — SODIUM CHLORIDE 0.9% FLUSH
3.0000 mL | INTRAVENOUS | Status: DC | PRN
  Administered 2020-03-07 (×2): 3 mL via INTRAVENOUS

## 2020-03-07 MED ORDER — POTASSIUM CHLORIDE CRYS ER 20 MEQ PO TBCR
30.0000 meq | EXTENDED_RELEASE_TABLET | Freq: Two times a day (BID) | ORAL | Status: AC
  Administered 2020-03-07: 30 meq via ORAL
  Filled 2020-03-07 (×2): qty 1

## 2020-03-07 MED ORDER — KCL IN DEXTROSE-NACL 20-5-0.45 MEQ/L-%-% IV SOLN
INTRAVENOUS | Status: DC
  Filled 2020-03-07: qty 1000

## 2020-03-07 MED ORDER — SODIUM CHLORIDE 0.9 % IV SOLN
250.0000 mL | INTRAVENOUS | Status: DC | PRN
  Administered 2020-03-19 – 2020-03-28 (×5): 250 mL via INTRAVENOUS

## 2020-03-07 MED ORDER — PROMETHAZINE HCL 25 MG/ML IJ SOLN
12.5000 mg | Freq: Four times a day (QID) | INTRAMUSCULAR | Status: DC | PRN
  Administered 2020-03-07 – 2020-03-08 (×3): 12.5 mg via INTRAVENOUS
  Filled 2020-03-07 (×3): qty 1

## 2020-03-07 MED ORDER — SODIUM CHLORIDE 0.9% FLUSH
3.0000 mL | Freq: Two times a day (BID) | INTRAVENOUS | Status: DC
  Administered 2020-03-07 – 2020-04-05 (×44): 3 mL via INTRAVENOUS

## 2020-03-07 MED ORDER — POTASSIUM CHLORIDE 10 MEQ/100ML IV SOLN
10.0000 meq | INTRAVENOUS | Status: AC
  Administered 2020-03-07 (×3): 10 meq via INTRAVENOUS
  Filled 2020-03-07 (×3): qty 100

## 2020-03-07 NOTE — Progress Notes (Signed)
[facility] Progress Note  4 Days Post-Op  Subjective: Patient with some episodes of emesis overnight and possible aspiration, CXR and KUB without clear PNA or ileus. Pain well controlled. Patient sleepy this AM. Husband at the bedside. Foley removed yesterday and she has been able to urinate several times since.   Objective: Vital signs in last 24 hours: Temp:  [97.5 F (36.4 C)-98.2 F (36.8 C)] 97.5 F (36.4 C) (03/25 0400) Pulse Rate:  [78-103] 78 (03/25 0700) Resp:  [15-26] 19 (03/25 0700) BP: (89-169)/(40-109) 126/80 (03/25 0700) SpO2:  [94 %-100 %] 99 % (03/25 0731) Weight:  [70.5 kg] 70.5 kg (03/25 0500) Last BM Date: 03/06/20  Intake/Output from previous day: 03/24 0701 - 03/25 0700 In: 1250.8 [P.O.:40; I.V.:1110.8; IV Piggyback:99.9] Out: 1180 [Urine:650; Emesis/NG output:125; Drains:155; Stool:250] Intake/Output this shift: No intake/output data recorded.  PE: General: pleasant, NAD, lethargic but very easily rousable  Heart: regular, rate, and rhythm.  Normal s1,s2. No obvious murmurs, gallops, or rubs noted.  Palpable radial and pedal pulses bilaterally Lungs: diminished slightly bilaterally, tachypnea Abd: soft, NT, ND, +BS, stoma ischemic looking superficially but functioning, drain with serous fluid, midline wound clean without purulence   Lab Results:  Recent Labs    03/06/20 0236 03/07/20 0238  WBC 15.1* 17.7*  HGB 11.5* 12.0  HCT 36.5 37.5  PLT 227 251   BMET Recent Labs    03/06/20 0236 03/07/20 0238  NA 136 133*  K 3.5 3.0*  CL 98 94*  CO2 25 29  GLUCOSE 199* 131*  BUN 46* 41*  CREATININE 2.11* 1.37*  CALCIUM 7.9* 8.0*   PT/INR No results for input(s): LABPROT, INR in the last 72 hours. CMP     Component Value Date/Time   NA 133 (L) 03/07/2020 0238   K 3.0 (L) 03/07/2020 0238   CL 94 (L) 03/07/2020 0238   CO2 29 03/07/2020 0238   GLUCOSE 131 (H) 03/07/2020 0238   BUN 41 (H) 03/07/2020 0238   CREATININE 1.37 (H)  03/07/2020 0238   CALCIUM 8.0 (L) 03/07/2020 0238   PROT 7.1 03/03/2020 1214   ALBUMIN 2.4 (L) 03/05/2020 1446   AST 23 03/03/2020 1214   ALT 13 03/03/2020 1214   ALKPHOS 67 03/03/2020 1214   BILITOT 1.4 (H) 03/03/2020 1214   GFRNONAA 40 (L) 03/07/2020 0238   GFRAA 46 (L) 03/07/2020 0238   Lipase     Component Value Date/Time   LIPASE 18 03/03/2020 1214       Studies/Results: DG Abd 1 View  Result Date: 03/07/2020 CLINICAL DATA:  Abdominal pain EXAM: ABDOMEN - 1 VIEW COMPARISON:  CT 03/03/2020 FINDINGS: Gaseous distention of the stomach and transverse colon. No visible free air. Surgical drain projects over the pelvis. No evidence of obstruction. IMPRESSION: No evidence of bowel obstruction or free air. Electronically Signed   By: Rolm Baptise M.D.   On: 03/07/2020 03:37   DG CHEST PORT 1 VIEW  Result Date: 03/07/2020 CLINICAL DATA:  Aspiration EXAM: PORTABLE CHEST 1 VIEW COMPARISON:  03/06/2020 FINDINGS: Areas of scarring in the right upper lobe. Heart is normal size. No effusions. Left lung clear. No acute bony abnormality. IMPRESSION: Right upper lobe scarring.  No active disease. Electronically Signed   By: Rolm Baptise M.D.   On: 03/07/2020 03:38   DG CHEST PORT 1 VIEW  Result Date: 03/06/2020 CLINICAL DATA:  Shortness of breath. History of asthma/COPD. EXAM: PORTABLE CHEST 1 VIEW COMPARISON:  Radiograph 10/27/2019 FINDINGS: Patient is rotated.  Heart is normal in size. Unchanged mediastinal contours allowing for differences in technique. Paucity of lung markings suggesting emphysema. Ill-defined patchy opacity in the suprahilar lungs, right greater than left. Linear opacity in the right midlung zone may represent atelectasis or fluid in the fissure. No pneumothorax or large pleural effusion. No acute osseous abnormalities are seen. IMPRESSION: Ill-defined patchy opacity in the suprahilar lungs, right greater than left. This may represent pneumonia, atelectasis/partial lobar  collapse, or less likely cephalization of pulmonary vasculature. Recommend follow-up after course of treatment to document resolution. Paucity of lung markings suggesting emphysema. Electronically Signed   By: Keith Rake M.D.   On: 03/06/2020 10:12    Anti-infectives: Anti-infectives (From admission, onward)   Start     Dose/Rate Route Frequency Ordered Stop   03/06/20 1200  piperacillin-tazobactam (ZOSYN) IVPB 3.375 g     3.375 g 12.5 mL/hr over 240 Minutes Intravenous Every 8 hours 03/06/20 0739     03/05/20 1400  piperacillin-tazobactam (ZOSYN) IVPB 2.25 g  Status:  Discontinued     2.25 g 100 mL/hr over 30 Minutes Intravenous Every 8 hours 03/05/20 0752 03/06/20 0739   03/04/20 2200  piperacillin-tazobactam (ZOSYN) IVPB 3.375 g  Status:  Discontinued     3.375 g 12.5 mL/hr over 240 Minutes Intravenous Every 8 hours 03/03/20 2032 03/05/20 0752   03/03/20 1545  cefoTEtan (CEFOTAN) 2 g in sodium chloride 0.9 % 100 mL IVPB     2 g 200 mL/hr over 30 Minutes Intravenous On call to O.R. 03/03/20 1532 03/04/20 0444   03/03/20 1345  piperacillin-tazobactam (ZOSYN) IVPB 3.375 g     3.375 g 12.5 mL/hr over 240 Minutes Intravenous  Once 03/03/20 1330 03/03/20 1507       Assessment/Plan COPD AKI - Cr 1.37, UOP good, appreciate renal recommendations Hypokalemia - K 3.0 this AM, replace PO  Perforated sigmoid colon Proximal rectal cancer with obstruction S/p emergent Low anterior resection/ colostomy by Dr. Marcello Moores 3/21 - POD#4 - continue daily dressing changes - WOC following for ostomy teaching - stoma appears necrotic but functioning currently, watch closely  - WBC 17, afebrile, BP improved today - if remains up, will need repeat CT - drain with SS fluid, leave for now - encourage IS - continue to mobilize - initially was thinking of transferring out of SDU today but given emesis overnight with possible aspiration and increased oxygen requirement will continue to monitor in  SDU   FEN - CLD, SLIV VTE - SCDs, lovenox ID - Zosyn 3/21>> Foley - removed 3/24 Follow up - Dr. Marcello Moores  LOS: 4 days    Brigid Re , [facility] 03/07/2020, 8:21 AM Please see Amion for pager number during day hours 7:00am-4:30pm

## 2020-03-07 NOTE — Progress Notes (Signed)
OT Cancellation Note  Patient Details Name: Tracy [name] MRN: [id number] DOB: 1950/12/25   Cancelled Treatment:    Reason Eval/Treat Not Completed: Medical issues which prohibited therapy, emesis and abdominal pain. Will attempt OT tx session on different date.   Mihail Prettyman 03/07/2020, 1:38 PM

## 2020-03-07 NOTE — Progress Notes (Signed)
RN notified Dr. Marlou Starks with surgery, that pt had a large emesis occurrence. RN concerned for possible aspiration, d/t increased O2 needs and rhonchus lung sounds; pt now on 4L [place] (had been on 2L), O2 sat 95. RN gave zofran and encouraged pt to cough to clear lungs. STAT chest and abdominal Xrays were ordered. RN will continue to monitor.

## 2020-03-07 NOTE — Progress Notes (Addendum)
Physical Therapy Treatment Patient Details Name: Tracy [name] MRN: [id number] DOB: Jul 24, 1951 Today's Date: 03/07/2020    History of Present Illness Tracy PIPPERT is an 69 y.o. female who is here for abd pain,  diarrhea for several weeks. Found to have bowel perforation, perforated viscus.   S/Plow anterior resection end colostomy on 03/03/20    PT Comments    Assisted patient to [facility], vomited large amount green liquids. RN aware. Assisted back to bed. Patient feels poorly today. Continue  Mobility as tolerated.  Patient has mottling areas on the legs and feet,. Does not appear tender to touch.  Follow Up Recommendations  Home health PT     Equipment Recommendations  None recommended by PT    Recommendations for Other Services       Precautions / Restrictions Precautions Precaution Comments: colostomt, right JP, emesis possible    Mobility  Bed Mobility Overal bed mobility: Needs Assistance Bed Mobility: Rolling;Sidelying to Sit;Sit to Sidelying Rolling: Mod assist Sidelying to sit: Mod assist     Sit to sidelying: Mod assist General bed mobility comments: extra time to roll, . Pt. placed legs to bed edge, assist with trunk to sit up, Assist with trunk and legs to return to supine  Transfers Overall transfer level: Needs assistance Equipment used: 1 person hand held assist Transfers: Sit to/from [employer] Transfers Sit to Stand: Mod assist Stand pivot transfers: Mod assist       General transfer comment: assisted to [facility], vomited large amount of green emesis. RN aware. Assisted back to bed.  Ambulation/Gait                 Stairs             Wheelchair Mobility    Modified Rankin (Stroke Patients Only)       Balance                                            Cognition   Behavior During Therapy: Anxious                                   General Comments: patient  reports feeling tired,       Exercises      General Comments        Pertinent Vitals/Pain Pain Assessment: Faces Faces Pain Scale: Hurts even more Pain Location: abdomen Pain Descriptors / Indicators: Cramping;Grimacing;Guarding Pain Intervention(s): Monitored during session;Limited activity within patient's tolerance    Home Living                      Prior Function            PT Goals (current goals can now be found in the care plan section) Progress towards PT goals: (slow due to medical)    Frequency    Min 3X/week      PT Plan Current plan remains appropriate    Co-evaluation              AM-PAC PT "6 Clicks" Mobility   Outcome Measure  Help needed turning from your back to your side while in a flat bed without using bedrails?: A Lot Help needed moving from lying on your back to sitting on the side of a flat bed  without using bedrails?: A Lot Help needed moving to and from a bed to a chair (including a wheelchair)?: A Lot Help needed standing up from a chair using your arms (e.g., wheelchair or bedside chair)?: A Lot Help needed to walk in hospital room?: A Lot Help needed climbing 3-5 steps with a railing? : A Lot 6 Click Score: 12    End of Session Equipment Utilized During Treatment: Oxygen Activity Tolerance: Treatment limited secondary to medical complications (Comment);No increased pain;Patient limited by fatigue Patient left: in bed;with call bell/phone within reach;with family/visitor present;with nursing/sitter in room Nurse Communication: Mobility status PT Visit Diagnosis: Unsteadiness on feet (R26.81);Difficulty in walking, not elsewhere classified (R26.2)     Time: [id number] PT Time Calculation (min) (ACUTE ONLY): 29 min  Charges:  $Therapeutic Activity: 23-37 mins                     Tresa Endo PT Acute Rehabilitation Services Pager [phone] Office [phone]    Claretha Cooper 03/07/2020, 1:09 PM

## 2020-03-07 NOTE — Progress Notes (Signed)
Patient ID: Tracy [name], female   DOB: 02-Jan-1951, 69 y.o.   MRN: [id number] S: Feels a little better O:BP 140/68   Pulse 83   Temp 98 F (36.7 C) (Axillary)   Resp 20   Ht 5\' 2"  (1.575 m)   Wt 70.5 kg   LMP 12/14/2000   SpO2 97%   BMI 28.43 kg/m   Intake/Output Summary (Last 24 hours) at 03/07/2020 1425 Last data filed at 03/07/2020 1133 Gross per 24 hour  Intake 1720.48 ml  Output 1415 ml  Net 305.48 ml   Intake/Output: I/O last 3 completed shifts: In: 2761.2 [P.O.:40; I.V.:2577.6; IV Piggyback:143.6] Out: 1800 [Urine:1150; Emesis/NG output:125; Drains:275; Stool:250]  Intake/Output this shift:  Total I/O In: 469.7 [I.V.:166.1; IV Piggyback:303.6] Out: 460 [Emesis/NG output:200; Drains:60; Stool:200] Weight change:  Gen: pale, fatigued, female lying in bed in NAD CVS: no rub Resp:cta Abd: soft, mild tenderness, colostomy in place Ext: no edema  Recent Labs  Lab 03/03/20 1214 03/03/20 1214 03/04/20 0708 03/04/20 0836 03/05/20 0024 03/05/20 0422 03/05/20 0745 03/05/20 1210 03/05/20 1446 03/06/20 0236 03/07/20 0238  NA 136  --  137  --   --  140  --   --  136 136 133*  K 4.8   < > 6.1*   < > 5.8* 6.0* 5.0 4.6 3.8 3.5 3.0*  CL 99  --  109  --   --  110  --   --  102 98 94*  CO2 22  --  17*  --   --  15*  --   --  21* 25 29  GLUCOSE 129*  --  101*  --   --  97  --   --  241* 199* 131*  BUN 34*  --  35*  --   --  48*  --   --  45* 46* 41*  CREATININE 1.43*  --  1.90*  --   --  2.58*  --   --  2.29* 2.11* 1.37*  ALBUMIN 3.9  --   --   --   --   --   --   --  2.4*  --   --   CALCIUM 8.9  --  7.5*  --   --  7.8*  --   --  7.9* 7.9* 8.0*  PHOS  --   --   --   --   --   --   --   --  4.7*  --   --   AST 23  --   --   --   --   --   --   --   --   --   --   ALT 13  --   --   --   --   --   --   --   --   --   --    < > = values in this interval not displayed.   Liver Function Tests: Recent Labs  Lab 03/03/20 1214 03/05/20 1446  AST 23  --   ALT 13  --    ALKPHOS 67  --   BILITOT 1.4*  --   PROT 7.1  --   ALBUMIN 3.9 2.4*   Recent Labs  Lab 03/03/20 1214  LIPASE 18   No results for input(s): AMMONIA in the last 168 hours. CBC: Recent Labs  Lab 03/03/20 1214 03/03/20 1214 03/04/20 0238 03/04/20 [id number] 03/05/20 0422 03/06/20 0236 03/07/20 [id number]  WBC 16.2*   < > 13.2*   < > 11.6* 15.1* 17.7*  NEUTROABS 15.3*  --   --   --   --   --   --   HGB 14.8   < > 13.1   < > 11.6* 11.5* 12.0  HCT 46.8*   < > 42.8   < > 38.3 36.5 37.5  MCV 101.3*  --  102.6*  --  105.8* 100.0 98.7  PLT 322   < > 364   < > 263 227 251   < > = values in this interval not displayed.   Cardiac Enzymes: Recent Labs  Lab 03/05/20 1446  CKTOTAL 307*   CBG: Recent Labs  Lab 03/03/20 2058  GLUCAP 97    Iron Studies: No results for input(s): IRON, TIBC, TRANSFERRIN, FERRITIN in the last 72 hours. Studies/Results: DG Abd 1 View  Result Date: 03/07/2020 CLINICAL DATA:  Abdominal pain EXAM: ABDOMEN - 1 VIEW COMPARISON:  CT 03/03/2020 FINDINGS: Gaseous distention of the stomach and transverse colon. No visible free air. Surgical drain projects over the pelvis. No evidence of obstruction. IMPRESSION: No evidence of bowel obstruction or free air. Electronically Signed   By: Rolm Baptise M.D.   On: 03/07/2020 03:37   DG CHEST PORT 1 VIEW  Result Date: 03/07/2020 CLINICAL DATA:  Aspiration EXAM: PORTABLE CHEST 1 VIEW COMPARISON:  03/06/2020 FINDINGS: Areas of scarring in the right upper lobe. Heart is normal size. No effusions. Left lung clear. No acute bony abnormality. IMPRESSION: Right upper lobe scarring.  No active disease. Electronically Signed   By: Rolm Baptise M.D.   On: 03/07/2020 03:38   DG CHEST PORT 1 VIEW  Result Date: 03/06/2020 CLINICAL DATA:  Shortness of breath. History of asthma/COPD. EXAM: PORTABLE CHEST 1 VIEW COMPARISON:  Radiograph 10/27/2019 FINDINGS: Patient is rotated. Heart is normal in size. Unchanged mediastinal contours allowing for  differences in technique. Paucity of lung markings suggesting emphysema. Ill-defined patchy opacity in the suprahilar lungs, right greater than left. Linear opacity in the right midlung zone may represent atelectasis or fluid in the fissure. No pneumothorax or large pleural effusion. No acute osseous abnormalities are seen. IMPRESSION: Ill-defined patchy opacity in the suprahilar lungs, right greater than left. This may represent pneumonia, atelectasis/partial lobar collapse, or less likely cephalization of pulmonary vasculature. Recommend follow-up after course of treatment to document resolution. Paucity of lung markings suggesting emphysema. Electronically Signed   By: Keith Rake M.D.   On: 03/06/2020 10:12   . albuterol  2.5 mg Nebulization BID  . Chlorhexidine Gluconate Cloth  6 each Topical Daily  . enoxaparin (LOVENOX) injection  30 mg Subcutaneous Q24H  . feeding supplement  1 Container Oral BID BM  . feeding supplement (PRO-STAT SUGAR FREE 64)  30 mL Oral TID BM  . gabapentin  300 mg Oral QHS  . umeclidinium bromide  1 puff Inhalation Daily   And  . mometasone-formoterol  2 puff Inhalation BID  . multivitamin with minerals  1 tablet Oral Daily  . potassium chloride  30 mEq Oral BID  . saccharomyces boulardii  250 mg Oral BID  . sodium chloride flush  3 mL Intravenous Q12H    BMET    Component Value Date/Time   NA 133 (L) 03/07/2020 0238   K 3.0 (L) 03/07/2020 0238   CL 94 (L) 03/07/2020 0238   CO2 29 03/07/2020 0238   GLUCOSE 131 (H) 03/07/2020 0238   BUN 41 (H) 03/07/2020 [id number]  CREATININE 1.37 (H) 03/07/2020 0238   CALCIUM 8.0 (L) 03/07/2020 0238   GFRNONAA 40 (L) 03/07/2020 0238   GFRAA 46 (L) 03/07/2020 0238   CBC    Component Value Date/Time   WBC 17.7 (H) 03/07/2020 0238   RBC 3.80 (L) 03/07/2020 0238   HGB 12.0 03/07/2020 0238   HCT 37.5 03/07/2020 0238   PLT 251 03/07/2020 0238   MCV 98.7 03/07/2020 0238   MCH 31.6 03/07/2020 0238   MCHC 32.0 03/07/2020  0238   RDW 12.3 03/07/2020 0238   LYMPHSABS 0.4 (L) 03/03/2020 1214   MONOABS 0.4 03/03/2020 1214   EOSABS 0.0 03/03/2020 1214   BASOSABS 0.1 03/03/2020 1214     Assessment/Plan:  1. AKI- presumably due to ischemic ATN in setting of hypotension, ABLA, and NSAIDs.  Renal function improving with IVF's.  Nothing further to add and will sign off for now.  No need for outpatient follow up as her renal function is normalizing. 2. Hyperkalemia- resolved and now low.  Replete and follow. 3. Metabolic acidosis due to #1- resolved.  Off of bicarb 4. Perforated sigmoid colon/proximal rectal cancer with obstruction- s/p emergent surgery.  Plan per Surgery.  Donetta Potts, MD [employer] [phone]

## 2020-03-08 ENCOUNTER — Inpatient Hospital Stay (HOSPITAL_COMMUNITY): Payer: Medicare HMO

## 2020-03-08 ENCOUNTER — Encounter (HOSPITAL_COMMUNITY): Payer: Self-pay

## 2020-03-08 LAB — BASIC METABOLIC PANEL
Anion gap: 11 (ref 5–15)
BUN: 34 mg/dL — ABNORMAL HIGH (ref 8–23)
CO2: 30 mmol/L (ref 22–32)
Calcium: 8.3 mg/dL — ABNORMAL LOW (ref 8.9–10.3)
Chloride: 94 mmol/L — ABNORMAL LOW (ref 98–111)
Creatinine, Ser: 1.12 mg/dL — ABNORMAL HIGH (ref 0.44–1.00)
GFR calc Af Amer: 59 mL/min — ABNORMAL LOW (ref >60)
GFR calc non Af Amer: 51 mL/min — ABNORMAL LOW (ref >60)
Glucose, Bld: 105 mg/dL — ABNORMAL HIGH (ref 70–99)
Potassium: 3.6 mmol/L (ref 3.5–5.1)
Sodium: 135 mmol/L (ref 135–145)

## 2020-03-08 LAB — BLOOD GAS, ARTERIAL
Acid-base deficit: 0.4 mmol/L (ref 0.0–2.0)
Bicarbonate: 24.4 mmol/L (ref 20.0–28.0)
O2 Content: 4 L/min
O2 Saturation: 92.2 %
Patient temperature: 97.7
pCO2 arterial: 42 mmHg (ref 32.0–48.0)
pH, Arterial: 7.38 (ref 7.350–7.450)
pO2, Arterial: 67.6 mmHg — ABNORMAL LOW (ref 83.0–108.0)

## 2020-03-08 LAB — URINALYSIS, ROUTINE W REFLEX MICROSCOPIC
Bacteria, UA: NONE SEEN
Bilirubin Urine: NEGATIVE
Glucose, UA: NEGATIVE mg/dL
Ketones, ur: 20 mg/dL — AB
Leukocytes,Ua: NEGATIVE
Nitrite: NEGATIVE
Protein, ur: 30 mg/dL — AB
Specific Gravity, Urine: >1.046 — ABNORMAL HIGH (ref 1.005–1.030)
pH: 5 (ref 5.0–8.0)

## 2020-03-08 LAB — CBC
HCT: 42.2 % (ref 36.0–46.0)
Hemoglobin: 13.9 g/dL (ref 12.0–15.0)
MCH: 31.7 pg (ref 26.0–34.0)
MCHC: 32.9 g/dL (ref 30.0–36.0)
MCV: 96.3 fL (ref 80.0–100.0)
Platelets: 256 10*3/uL (ref 150–400)
RBC: 4.38 MIL/uL (ref 3.87–5.11)
RDW: 12 % (ref 11.5–15.5)
WBC: 22 10*3/uL — ABNORMAL HIGH (ref 4.0–10.5)
nRBC: 0 % (ref 0.0–0.2)

## 2020-03-08 LAB — LACTIC ACID, PLASMA: Lactic Acid, Venous: 1.2 mmol/L (ref 0.5–1.9)

## 2020-03-08 MED ORDER — SODIUM CHLORIDE 0.9 % IV SOLN: INTRAVENOUS | Status: DC

## 2020-03-08 MED ORDER — HEPARIN BOLUS VIA INFUSION
2000.0000 [IU] | Freq: Once | INTRAVENOUS | Status: AC
  Administered 2020-03-08: 2000 [IU] via INTRAVENOUS
  Filled 2020-03-08: qty 2000

## 2020-03-08 MED ORDER — IOHEXOL 9 MG/ML PO SOLN
500.0000 mL | ORAL | Status: DC
  Administered 2020-03-08: 500 mL via ORAL

## 2020-03-08 MED ORDER — FUROSEMIDE 10 MG/ML IJ SOLN: 20.0000 mg | Freq: Once | INTRAMUSCULAR | Status: AC

## 2020-03-08 MED ORDER — IOHEXOL 9 MG/ML PO SOLN
ORAL | Status: AC
  Filled 2020-03-08: qty 1000

## 2020-03-08 MED ORDER — FUROSEMIDE 10 MG/ML IJ SOLN
INTRAMUSCULAR | Status: AC
  Administered 2020-03-08: 20 mg via INTRAVENOUS
  Filled 2020-03-08: qty 2

## 2020-03-08 MED ORDER — HEPARIN (PORCINE) 25000 UT/250ML-% IV SOLN
1000.0000 [IU]/h | INTRAVENOUS | Status: DC
  Administered 2020-03-08: 1000 [IU]/h via INTRAVENOUS
  Filled 2020-03-08 (×2): qty 250

## 2020-03-08 MED ORDER — HALOPERIDOL LACTATE 5 MG/ML IJ SOLN
2.0000 mg | Freq: Four times a day (QID) | INTRAMUSCULAR | Status: DC | PRN
  Administered 2020-03-08: 2 mg via INTRAVENOUS
  Filled 2020-03-08: qty 1

## 2020-03-08 MED ORDER — ACETAMINOPHEN 325 MG PO TABS
650.0000 mg | ORAL_TABLET | Freq: Four times a day (QID) | ORAL | Status: DC
  Administered 2020-03-08 – 2020-03-09 (×2): 650 mg via ORAL
  Filled 2020-03-08 (×2): qty 2

## 2020-03-08 MED ORDER — SODIUM CHLORIDE (PF) 0.9 % IJ SOLN
INTRAMUSCULAR | Status: AC
  Filled 2020-03-08: qty 50

## 2020-03-08 MED ORDER — FLUTICASONE-UMECLIDIN-VILANT 100-62.5-25 MCG/INH IN AEPB: 1.0000 | INHALATION_SPRAY | Freq: Every day | RESPIRATORY_TRACT | Status: DC

## 2020-03-08 MED ORDER — IOHEXOL 300 MG/ML  SOLN
100.0000 mL | Freq: Once | INTRAMUSCULAR | Status: AC | PRN
  Administered 2020-03-08: 100 mL via INTRAVENOUS

## 2020-03-08 NOTE — Progress Notes (Signed)
Pt's V/S-B/P-150/83,T-97.8,HR-106,O2 sats 91% with [place] 3 lit/min,R-36. Putting pt at a MEWS 4. Red MEWS protocol initiated.pt is alert and oriented at this time. RR nurse notified about pt. RT administered breathing treatment. Pt said she is feeling little better than earlier. Placed pt on continuous pulse ox. Will continue to monitor.

## 2020-03-08 NOTE — Progress Notes (Addendum)
Pt with worsening tachypnea (36)/dyspnea, slight increase in O2 requirement, and delirium/ agitation.  Rapid response was called. I was notified and asked that the patient be transferred to the ICU, where she has just arrived.   In the interim, CXR & ABG ordered; CXR with severe emphysematous changes, small bilateral effusions but no significant short interval change. Previous notes noted possible aspiration a couple days ago but no sign of infiltrate. ABG 7.38/ 42/ 67.6/ bicarb 24.4  I gave her a dose of lasix initially as she is 6l positive this admission, but looking at her urine specific gravity and her rising hgb I suspect she is actually intravascularly dry. This did not seem to improve her respiratory status.   She is currently alert, confused, answering some questions appropriately Tachypneic and labored, sats low 90s on nasal canula  Abdomen is distended, appropriately tender, and midline wound is healthy appearing. The ostomy is edematous and the mucosa is necrotic (gray-brown). The lumen is patent below the fascia by digital exam and feels to be intact without disruption of the colon wall at any level between the fascia and the skin. No erythema or cellulitis around the stoma.   Plan- 1. NPO except meds, insert NG tube if nausea or emesis 2. Stop narcotics and sedating meds until mental status improves, haldol prn agitation 3. Start heparin drip empirically as she has increased risk of dvt/PE given cancer diagnosis and recent clinical course. Will forego CT PE as she has barely just recovered from ARF and received a IV contrast load already today.  4. Escalate O2 support, may require intubation if worsening 5. I suspect her rising wbc is from the necrotic stoma. As it is still functioning and intact, will continue to monitor this; may require revision. She is on zosyn. She did have a CT today that demonstrates ileus but no intraabdominal infection.    I updated her husband.

## 2020-03-08 NOTE — Progress Notes (Signed)
Pt transferred to ICU,Dr connor ,chelsea came to see her at bedside. Report given to [facility]. Pt's husband notified .

## 2020-03-08 NOTE — Progress Notes (Signed)
ANTICOAGULATION CONSULT NOTE - Initial Consult  Pharmacy Consult for IV heparin Indication: rule out PE  Allergies  Allergen Reactions  . Codeine Swelling    Patient Measurements: Height: 5\' 2"  (157.5 cm) Weight: 154 lb 1.6 oz (69.9 kg) IBW/kg (Calculated) : 50.1 Heparin Dosing Weight: 64.75  Vital Signs: Temp: 97.7 F (36.5 C) (03/26 2232) Temp Source: Axillary (03/26 2232) BP: 120/72 (03/26 2232) Pulse Rate: 107 (03/26 2131)  Labs: Recent Labs    03/06/20 0236 03/06/20 0236 03/07/20 0238 03/08/20 0252  HGB 11.5*   < > 12.0 13.9  HCT 36.5  --  37.5 42.2  PLT 227  --  251 256  CREATININE 2.11*  --  1.37* 1.12*   < > = values in this interval not displayed.    Estimated Creatinine Clearance: 45.2 mL/min (A) (by C-G formula based on SCr of 1.12 mg/dL (H)).   Medical History: Past Medical History:  Diagnosis Date  . Asthma   . COPD (chronic obstructive pulmonary disease) ([place]) 2011   FeV1 31% predicted FeV1/FVX 47 %  . Hemorrhoid   . Osteopenia   . Seasonal allergies    takes Claritin daily prn  . Vitamin D deficiency    takes Vit d every 14 days    Medications:  Scheduled:  . [START ON 03/09/2020] acetaminophen  650 mg Oral Q6H  . Chlorhexidine Gluconate Cloth  6 each Topical Daily  . feeding supplement  1 Container Oral BID BM  . feeding supplement (PRO-STAT SUGAR FREE 64)  30 mL Oral TID BM  . umeclidinium bromide  1 puff Inhalation Daily   And  . mometasone-formoterol  2 puff Inhalation BID  . multivitamin with minerals  1 tablet Oral Daily  . saccharomyces boulardii  250 mg Oral BID  . sodium chloride flush  3 mL Intravenous Q12H   Infusions:  . sodium chloride    . sodium chloride 75 mL/hr at 03/08/20 0851  . piperacillin-tazobactam (ZOSYN)  IV 3.375 g (03/08/20 2036)    Assessment: 69 yo [name] s/p emergent low anterior resection/colostomy on 3/21 due to perforated sigmoid colon. Pt now with increased shortness of breath and cannot have  anymore contrast per [facility]CS as looks like had some contrast already today for a CT of abd pelvis. To start IV heparin for possible PE empirically until can rule out or in PE. Note that patient received Lovenox 30mg  this AM at 0847. Labs stable currently  Goal of Therapy:  Heparin level 0.3-0.7 units/ml Monitor platelets by anticoagulation protocol: Yes   Plan:  1) IV heparin bolus of 2000 units then 2) IV heparin rate of 1000 units/hr 3) Check heparin level 6 hours after start of IV heparin 4) Daily CBC  Kara Mead 03/08/2020,10:37 PM

## 2020-03-08 NOTE — Progress Notes (Signed)
[facility] Progress Note  5 Days Post-Op  Subjective: Patient reports she had a little more pain overnight and needed some medication for pain but overall pain control still adequate. Had one episode of nausea and emesis with getting up yesterday but did better the rest of the day. Feeling slightly nauseated this AM. Stoma is working. UOP good. Husband was at the bedside this AM.   Objective: Vital signs in last 24 hours: Temp:  [97.5 F (36.4 C)-99.2 F (37.3 C)] 98.1 F (36.7 C) (03/26 0800) Pulse Rate:  [83-93] 93 (03/26 0606) Resp:  [17-28] 20 (03/26 0606) BP: (114-158)/(52-97) 135/76 (03/26 0606) SpO2:  [94 %-100 %] 95 % (03/26 0606) Weight:  [69.9 kg] 69.9 kg (03/26 0500) Last BM Date: 03/08/20(ostomy)  Intake/Output from previous day: 03/25 0701 - 03/26 0700 In: 790.4 [P.O.:200; I.V.:176.1; IV Piggyback:414.3] Out: 950 [Urine:150; Emesis/NG output:200; Drains:315; Stool:285] Intake/Output this shift: No intake/output data recorded.  PE: General: pleasant,NAD, lethargic but very easily rousable Heart: regular, rate, and rhythm. Normal s1,s2. No obvious murmurs, gallops, or rubs noted. Palpable radial and pedal pulses bilaterally Lungs:diminished slightly bilaterally, tachypnea Abd: soft, NT, ND, +BS,stoma ischemic looking superficially but functioning, drain with serous fluid, midline wound clean without purulence   Lab Results:  Recent Labs    03/07/20 0238 03/08/20 0252  WBC 17.7* 22.0*  HGB 12.0 13.9  HCT 37.5 42.2  PLT 251 256   BMET Recent Labs    03/07/20 0238 03/08/20 0252  NA 133* 135  K 3.0* 3.6  CL 94* 94*  CO2 29 30  GLUCOSE 131* 105*  BUN 41* 34*  CREATININE 1.37* 1.12*  CALCIUM 8.0* 8.3*   PT/INR No results for input(s): LABPROT, INR in the last 72 hours. CMP     Component Value Date/Time   NA 135 03/08/2020 0252   K 3.6 03/08/2020 0252   CL 94 (L) 03/08/2020 0252   CO2 30 03/08/2020 0252   GLUCOSE 105 (H)  03/08/2020 0252   BUN 34 (H) 03/08/2020 0252   CREATININE 1.12 (H) 03/08/2020 0252   CALCIUM 8.3 (L) 03/08/2020 0252   PROT 7.1 03/03/2020 1214   ALBUMIN 2.4 (L) 03/05/2020 1446   AST 23 03/03/2020 1214   ALT 13 03/03/2020 1214   ALKPHOS 67 03/03/2020 1214   BILITOT 1.4 (H) 03/03/2020 1214   GFRNONAA 51 (L) 03/08/2020 0252   GFRAA 59 (L) 03/08/2020 0252   Lipase     Component Value Date/Time   LIPASE 18 03/03/2020 1214       Studies/Results: DG Abd 1 View  Result Date: 03/07/2020 CLINICAL DATA:  Abdominal pain EXAM: ABDOMEN - 1 VIEW COMPARISON:  CT 03/03/2020 FINDINGS: Gaseous distention of the stomach and transverse colon. No visible free air. Surgical drain projects over the pelvis. No evidence of obstruction. IMPRESSION: No evidence of bowel obstruction or free air. Electronically Signed   By: Rolm Baptise M.D.   On: 03/07/2020 03:37   DG CHEST PORT 1 VIEW  Result Date: 03/07/2020 CLINICAL DATA:  Aspiration EXAM: PORTABLE CHEST 1 VIEW COMPARISON:  03/06/2020 FINDINGS: Areas of scarring in the right upper lobe. Heart is normal size. No effusions. Left lung clear. No acute bony abnormality. IMPRESSION: Right upper lobe scarring.  No active disease. Electronically Signed   By: Rolm Baptise M.D.   On: 03/07/2020 03:38    Anti-infectives: Anti-infectives (From admission, onward)   Start     Dose/Rate Route Frequency Ordered Stop   03/06/20 1200  piperacillin-tazobactam (  ZOSYN) IVPB 3.375 g     3.375 g 12.5 mL/hr over 240 Minutes Intravenous Every 8 hours 03/06/20 0739     03/05/20 1400  piperacillin-tazobactam (ZOSYN) IVPB 2.25 g  Status:  Discontinued     2.25 g 100 mL/hr over 30 Minutes Intravenous Every 8 hours 03/05/20 0752 03/06/20 0739   03/04/20 2200  piperacillin-tazobactam (ZOSYN) IVPB 3.375 g  Status:  Discontinued     3.375 g 12.5 mL/hr over 240 Minutes Intravenous Every 8 hours 03/03/20 2032 03/05/20 0752   03/03/20 1545  cefoTEtan (CEFOTAN) 2 g in sodium  chloride 0.9 % 100 mL IVPB     2 g 200 mL/hr over 30 Minutes Intravenous On call to O.R. 03/03/20 1532 03/04/20 0444   03/03/20 1345  piperacillin-tazobactam (ZOSYN) IVPB 3.375 g     3.375 g 12.5 mL/hr over 240 Minutes Intravenous  Once 03/03/20 1330 03/03/20 1507       Assessment/Plan COPD AKI - Cr 1.12, UOP good, appreciate renal recommendations Hypokalemia - K 3.6 this AM, replace PO  Perforated sigmoid colon Proximal rectal cancer with obstruction S/p emergent Low anterior resection/ colostomy by Dr. Marcello Moores 3/21 - POD#5 - continue daily dressing changes - WOC following for ostomy teaching - stoma improving and functioning - WBC trending up and is 22 today, afebrile - CT abdomen pelvis today, discussed with renal who recommend IVF with contrast - drain with more murky fluid today and 315cc out in 24 hrs, leave for now - encourage IS - continue to mobilize   FEN - NPO, IVF VTE - SCDs, lovenox ID - [id number]>> Foley - removed 3/24 Follow up - Dr. Marcello Moores  LOS: 5 days    Brigid Re , [facility] 03/08/2020, 8:41 AM Please see Amion for pager number during day hours 7:00am-4:30pm

## 2020-03-08 NOTE — Progress Notes (Signed)
RN called to patients room due to severe pain after eating around her stoma site. Site noted to be grey and ischemic which was noted in PA's note. Blood pressure and pulse noted to be within normal limits but respirations 36 putting pt at a MEWS 3. Yellow MEWS protocol initiated and on call MD made aware no new orders placed at this time.

## 2020-03-08 NOTE — Progress Notes (Signed)
OT Cancellation Note  Patient Details Name: Tracy [name] MRN: [id number] DOB: [birth year]   Cancelled Treatment:    Reason Eval/Treat Not Completed: Patient declined, no reason specified;Other (comment)(Nauseated and not feeling well )  Mellisa Arshad 03/08/2020, 1:07 PM

## 2020-03-08 NOTE — Progress Notes (Signed)
Pt is confuse now,not following command. Having Labored breathing. Maintaining O2 sats 93% with O2 N/C 3 LPM. RR Nurse is called MD.

## 2020-03-09 ENCOUNTER — Inpatient Hospital Stay (HOSPITAL_COMMUNITY): Payer: Medicare HMO

## 2020-03-09 ENCOUNTER — Inpatient Hospital Stay: Payer: Self-pay

## 2020-03-09 DIAGNOSIS — J9600 Acute respiratory failure, unspecified whether with hypoxia or hypercapnia: Secondary | ICD-10-CM

## 2020-03-09 DIAGNOSIS — R609 Edema, unspecified: Secondary | ICD-10-CM

## 2020-03-09 DIAGNOSIS — R579 Shock, unspecified: Secondary | ICD-10-CM

## 2020-03-09 DIAGNOSIS — J9601 Acute respiratory failure with hypoxia: Secondary | ICD-10-CM

## 2020-03-09 LAB — COMPREHENSIVE METABOLIC PANEL
ALT: 131 U/L — ABNORMAL HIGH (ref 0–44)
AST: 429 U/L — ABNORMAL HIGH (ref 15–41)
Albumin: 1.6 g/dL — ABNORMAL LOW (ref 3.5–5.0)
Alkaline Phosphatase: 61 U/L (ref 38–126)
Anion gap: 12 (ref 5–15)
BUN: 38 mg/dL — ABNORMAL HIGH (ref 8–23)
CO2: 23 mmol/L (ref 22–32)
Calcium: 7.3 mg/dL — ABNORMAL LOW (ref 8.9–10.3)
Chloride: 99 mmol/L (ref 98–111)
Creatinine, Ser: 1.6 mg/dL — ABNORMAL HIGH (ref 0.44–1.00)
GFR calc Af Amer: 39 mL/min — ABNORMAL LOW (ref >60)
GFR calc non Af Amer: 33 mL/min — ABNORMAL LOW (ref >60)
Glucose, Bld: 144 mg/dL — ABNORMAL HIGH (ref 70–99)
Potassium: 4.3 mmol/L (ref 3.5–5.1)
Sodium: 134 mmol/L — ABNORMAL LOW (ref 135–145)
Total Bilirubin: 1.3 mg/dL — ABNORMAL HIGH (ref 0.3–1.2)
Total Protein: 4.4 g/dL — ABNORMAL LOW (ref 6.5–8.1)

## 2020-03-09 LAB — CBC
HCT: 45.3 % (ref 36.0–46.0)
HCT: 39.3 % (ref 36.0–46.0)
Hemoglobin: 14.5 g/dL (ref 12.0–15.0)
Hemoglobin: 12.5 g/dL (ref 12.0–15.0)
MCH: 32.2 pg (ref 26.0–34.0)
MCH: 31.6 pg (ref 26.0–34.0)
MCHC: 32 g/dL (ref 30.0–36.0)
MCHC: 31.8 g/dL (ref 30.0–36.0)
MCV: 100.4 fL — ABNORMAL HIGH (ref 80.0–100.0)
MCV: 99.5 fL (ref 80.0–100.0)
Platelets: 357 10*3/uL (ref 150–400)
Platelets: 336 10*3/uL (ref 150–400)
RBC: 4.51 MIL/uL (ref 3.87–5.11)
RBC: 3.95 MIL/uL (ref 3.87–5.11)
RDW: 12.1 % (ref 11.5–15.5)
RDW: 12.2 % (ref 11.5–15.5)
WBC: 19.2 10*3/uL — ABNORMAL HIGH (ref 4.0–10.5)
WBC: 26.8 10*3/uL — ABNORMAL HIGH (ref 4.0–10.5)
nRBC: 0.2 % (ref 0.0–0.2)
nRBC: 0.1 % (ref 0.0–0.2)

## 2020-03-09 LAB — CBC WITH DIFFERENTIAL/PLATELET
Abs Immature Granulocytes: 0.67 10*3/uL — ABNORMAL HIGH (ref 0.00–0.07)
Basophils Absolute: 0 10*3/uL (ref 0.0–0.1)
Basophils Relative: 0 %
Eosinophils Absolute: 0 10*3/uL (ref 0.0–0.5)
Eosinophils Relative: 0 %
HCT: 36.3 % (ref 36.0–46.0)
Hemoglobin: 11.5 g/dL — ABNORMAL LOW (ref 12.0–15.0)
Immature Granulocytes: 3 %
Lymphocytes Relative: 3 %
Lymphs Abs: 0.9 10*3/uL (ref 0.7–4.0)
MCH: 31.3 pg (ref 26.0–34.0)
MCHC: 31.7 g/dL (ref 30.0–36.0)
MCV: 98.9 fL (ref 80.0–100.0)
Monocytes Absolute: 0.4 10*3/uL (ref 0.1–1.0)
Monocytes Relative: 2 %
Neutro Abs: 25.2 10*3/uL — ABNORMAL HIGH (ref 1.7–7.7)
Neutrophils Relative %: 92 %
Platelets: 385 10*3/uL (ref 150–400)
RBC: 3.67 MIL/uL — ABNORMAL LOW (ref 3.87–5.11)
RDW: 12.3 % (ref 11.5–15.5)
WBC Morphology: INCREASED
WBC: 27.2 10*3/uL — ABNORMAL HIGH (ref 4.0–10.5)
nRBC: 0.1 % (ref 0.0–0.2)

## 2020-03-09 LAB — TROPONIN I (HIGH SENSITIVITY)
Troponin I (High Sensitivity): 3 ng/L (ref <18)
Troponin I (High Sensitivity): 4 ng/L (ref <18)
Troponin I (High Sensitivity): 5 ng/L (ref <18)
Troponin I (High Sensitivity): 4 ng/L (ref <18)

## 2020-03-09 LAB — ECHOCARDIOGRAM COMPLETE
Height: 62 in
Weight: 2497.37 oz

## 2020-03-09 LAB — BLOOD GAS, ARTERIAL
Acid-base deficit: 3.3 mmol/L — ABNORMAL HIGH (ref 0.0–2.0)
Acid-base deficit: 2 mmol/L (ref 0.0–2.0)
Bicarbonate: 23 mmol/L (ref 20.0–28.0)
Bicarbonate: 23.9 mmol/L (ref 20.0–28.0)
FIO2: 100
MECHVT: 400 mL
O2 Content: 3 L/min
O2 Saturation: 90.2 %
O2 Saturation: 99.3 %
PEEP: 5 cmH2O
Patient temperature: 97.2
Patient temperature: 97.1
RATE: 18 resp/min
pCO2 arterial: 47.1 mmHg (ref 32.0–48.0)
pCO2 arterial: 46.1 mmHg (ref 32.0–48.0)
pH, Arterial: 7.305 — ABNORMAL LOW (ref 7.350–7.450)
pH, Arterial: 7.33 — ABNORMAL LOW (ref 7.350–7.450)
pO2, Arterial: 63.3 mmHg — ABNORMAL LOW (ref 83.0–108.0)
pO2, Arterial: 260 mmHg — ABNORMAL HIGH (ref 83.0–108.0)

## 2020-03-09 LAB — HEPATIC FUNCTION PANEL
ALT: 152 U/L — ABNORMAL HIGH (ref 0–44)
AST: 440 U/L — ABNORMAL HIGH (ref 15–41)
Albumin: 1.8 g/dL — ABNORMAL LOW (ref 3.5–5.0)
Alkaline Phosphatase: 57 U/L (ref 38–126)
Bilirubin, Direct: 0.3 mg/dL — ABNORMAL HIGH (ref 0.0–0.2)
Indirect Bilirubin: 0.7 mg/dL (ref 0.3–0.9)
Total Bilirubin: 1 mg/dL (ref 0.3–1.2)
Total Protein: 4.2 g/dL — ABNORMAL LOW (ref 6.5–8.1)

## 2020-03-09 LAB — BASIC METABOLIC PANEL
Anion gap: 16 — ABNORMAL HIGH (ref 5–15)
Anion gap: 14 (ref 5–15)
BUN: 35 mg/dL — ABNORMAL HIGH (ref 8–23)
BUN: 42 mg/dL — ABNORMAL HIGH (ref 8–23)
CO2: 20 mmol/L — ABNORMAL LOW (ref 22–32)
CO2: 22 mmol/L (ref 22–32)
Calcium: 8 mg/dL — ABNORMAL LOW (ref 8.9–10.3)
Calcium: 7.2 mg/dL — ABNORMAL LOW (ref 8.9–10.3)
Chloride: 100 mmol/L (ref 98–111)
Chloride: 100 mmol/L (ref 98–111)
Creatinine, Ser: 1.33 mg/dL — ABNORMAL HIGH (ref 0.44–1.00)
Creatinine, Ser: 1.74 mg/dL — ABNORMAL HIGH (ref 0.44–1.00)
GFR calc Af Amer: 48 mL/min — ABNORMAL LOW (ref >60)
GFR calc Af Amer: 35 mL/min — ABNORMAL LOW (ref >60)
GFR calc non Af Amer: 42 mL/min — ABNORMAL LOW (ref >60)
GFR calc non Af Amer: 30 mL/min — ABNORMAL LOW (ref >60)
Glucose, Bld: 99 mg/dL (ref 70–99)
Glucose, Bld: 195 mg/dL — ABNORMAL HIGH (ref 70–99)
Potassium: 4.9 mmol/L (ref 3.5–5.1)
Potassium: 4.6 mmol/L (ref 3.5–5.1)
Sodium: 136 mmol/L (ref 135–145)
Sodium: 136 mmol/L (ref 135–145)

## 2020-03-09 LAB — GLUCOSE, CAPILLARY
Glucose-Capillary: 104 mg/dL — ABNORMAL HIGH (ref 70–99)
Glucose-Capillary: 146 mg/dL — ABNORMAL HIGH (ref 70–99)
Glucose-Capillary: 189 mg/dL — ABNORMAL HIGH (ref 70–99)
Glucose-Capillary: 197 mg/dL — ABNORMAL HIGH (ref 70–99)

## 2020-03-09 LAB — HEPARIN LEVEL (UNFRACTIONATED): Heparin Unfractionated: 0.48 IU/mL (ref 0.30–0.70)

## 2020-03-09 LAB — PROCALCITONIN: Procalcitonin: 34.99 ng/mL

## 2020-03-09 LAB — PROTIME-INR
INR: 1.3 — ABNORMAL HIGH (ref 0.8–1.2)
Prothrombin Time: 16.2 seconds — ABNORMAL HIGH (ref 11.4–15.2)

## 2020-03-09 LAB — LACTIC ACID, PLASMA
Lactic Acid, Venous: 2.3 mmol/L (ref 0.5–1.9)
Lactic Acid, Venous: 2.4 mmol/L (ref 0.5–1.9)

## 2020-03-09 MED ORDER — LACTATED RINGERS IV BOLUS
500.0000 mL | Freq: Once | INTRAVENOUS | Status: AC
  Administered 2020-03-09: 500 mL via INTRAVENOUS

## 2020-03-09 MED ORDER — ETOMIDATE 2 MG/ML IV SOLN
20.0000 mg | Freq: Once | INTRAVENOUS | Status: AC
  Administered 2020-03-09: 20 mg via INTRAVENOUS

## 2020-03-09 MED ORDER — MIDAZOLAM HCL 2 MG/2ML IJ SOLN
2.0000 mg | INTRAMUSCULAR | Status: DC | PRN
  Administered 2020-03-10 – 2020-03-20 (×16): 2 mg via INTRAVENOUS
  Filled 2020-03-09 (×19): qty 2

## 2020-03-09 MED ORDER — LACTATED RINGERS IV BOLUS
1000.0000 mL | Freq: Once | INTRAVENOUS | Status: AC
  Administered 2020-03-09: 1000 mL via INTRAVENOUS

## 2020-03-09 MED ORDER — FENTANYL CITRATE (PF) 100 MCG/2ML IJ SOLN
100.0000 ug | Freq: Once | INTRAMUSCULAR | Status: AC
  Administered 2020-03-09: 100 ug via INTRAVENOUS

## 2020-03-09 MED ORDER — ROCURONIUM BROMIDE 50 MG/5ML IV SOLN
50.0000 mg | Freq: Once | INTRAVENOUS | Status: AC
  Administered 2020-03-09: 50 mg via INTRAVENOUS

## 2020-03-09 MED ORDER — CHLORHEXIDINE GLUCONATE 0.12% ORAL RINSE (MEDLINE KIT)
15.0000 mL | Freq: Two times a day (BID) | OROMUCOSAL | Status: DC
  Administered 2020-03-09 – 2020-04-05 (×51): 15 mL via OROMUCOSAL

## 2020-03-09 MED ORDER — SODIUM CHLORIDE 0.9 % IV BOLUS
1000.0000 mL | Freq: Once | INTRAVENOUS | Status: AC
  Administered 2020-03-09: 1000 mL via INTRAVENOUS

## 2020-03-09 MED ORDER — MAGNESIUM SULFATE 2 GM/50ML IV SOLN: 2.0000 g | Freq: Once | INTRAVENOUS | Status: AC

## 2020-03-09 MED ORDER — FENTANYL CITRATE (PF) 100 MCG/2ML IJ SOLN: 25.0000 ug | Freq: Once | INTRAMUSCULAR | Status: AC

## 2020-03-09 MED ORDER — FENTANYL CITRATE (PF) 100 MCG/2ML IJ SOLN
25.0000 ug | INTRAMUSCULAR | Status: AC | PRN
  Administered 2020-03-10 – 2020-03-11 (×3): 25 ug via INTRAVENOUS

## 2020-03-09 MED ORDER — FENTANYL CITRATE (PF) 100 MCG/2ML IJ SOLN
INTRAMUSCULAR | Status: AC
  Administered 2020-03-09: 25 ug
  Filled 2020-03-09: qty 2

## 2020-03-09 MED ORDER — VITAL HIGH PROTEIN PO LIQD
1000.0000 mL | ORAL | Status: DC
  Administered 2020-03-09 – 2020-03-10 (×2): 1000 mL

## 2020-03-09 MED ORDER — ORAL CARE MOUTH RINSE
15.0000 mL | OROMUCOSAL | Status: DC
  Administered 2020-03-09 – 2020-04-05 (×215): 15 mL via OROMUCOSAL

## 2020-03-09 MED ORDER — MAGNESIUM SULFATE 2 GM/50ML IV SOLN
INTRAVENOUS | Status: AC
  Administered 2020-03-09: 2 g
  Filled 2020-03-09: qty 50

## 2020-03-09 MED ORDER — DEXTROSE-NACL 5-0.45 % IV SOLN: INTRAVENOUS | Status: DC

## 2020-03-09 MED ORDER — PHENYLEPHRINE HCL-NACL 10-0.9 MG/250ML-% IV SOLN
0.0000 ug/min | INTRAVENOUS | Status: DC
  Administered 2020-03-09: 60 ug/min via INTRAVENOUS
  Administered 2020-03-09: 40 ug/min via INTRAVENOUS
  Administered 2020-03-09: 10 ug/min via INTRAVENOUS
  Administered 2020-03-09: 45 ug/min via INTRAVENOUS
  Filled 2020-03-09 (×3): qty 250

## 2020-03-09 MED ORDER — IPRATROPIUM-ALBUTEROL 0.5-2.5 (3) MG/3ML IN SOLN
3.0000 mL | RESPIRATORY_TRACT | Status: DC
  Administered 2020-03-09 – 2020-03-23 (×83): 3 mL via RESPIRATORY_TRACT
  Filled 2020-03-09 (×82): qty 3

## 2020-03-09 MED ORDER — NOREPINEPHRINE 4 MG/250ML-% IV SOLN
0.0000 ug/min | INTRAVENOUS | Status: DC
  Administered 2020-03-09: 2 ug/min via INTRAVENOUS
  Administered 2020-03-11: 3 ug/min via INTRAVENOUS
  Administered 2020-03-11: 4 ug/min via INTRAVENOUS
  Administered 2020-03-12: 2 ug/min via INTRAVENOUS
  Filled 2020-03-09 (×3): qty 250

## 2020-03-09 MED ORDER — PHENYLEPHRINE HCL-NACL 10-0.9 MG/250ML-% IV SOLN
INTRAVENOUS | Status: AC
  Filled 2020-03-09: qty 250

## 2020-03-09 MED ORDER — FENTANYL CITRATE (PF) 100 MCG/2ML IJ SOLN
25.0000 ug | INTRAMUSCULAR | Status: DC | PRN
  Administered 2020-03-09: 50 ug via INTRAVENOUS
  Administered 2020-03-12: 100 ug via INTRAVENOUS
  Administered 2020-03-12: 25 ug via INTRAVENOUS
  Administered 2020-03-14 – 2020-03-17 (×2): 50 ug via INTRAVENOUS
  Administered 2020-03-17: 02:00:00 100 ug via INTRAVENOUS
  Administered 2020-03-17: 01:00:00 50 ug via INTRAVENOUS
  Administered 2020-03-18: 100 ug via INTRAVENOUS
  Administered 2020-03-18: 75 ug via INTRAVENOUS
  Administered 2020-03-19 (×3): 100 ug via INTRAVENOUS
  Administered 2020-03-19: 75 ug via INTRAVENOUS
  Administered 2020-03-19: 100 ug via INTRAVENOUS
  Administered 2020-03-20 (×2): 75 ug via INTRAVENOUS
  Administered 2020-03-20: 100 ug via INTRAVENOUS
  Administered 2020-03-20 (×3): 50 ug via INTRAVENOUS
  Administered 2020-03-20 (×2): 75 ug via INTRAVENOUS
  Administered 2020-03-21: 100 ug via INTRAVENOUS

## 2020-03-09 MED ORDER — FENTANYL BOLUS VIA INFUSION
25.0000 ug | INTRAVENOUS | Status: DC | PRN
  Administered 2020-03-09 – 2020-03-20 (×21): 25 ug via INTRAVENOUS
  Filled 2020-03-09: qty 25

## 2020-03-09 MED ORDER — ENOXAPARIN SODIUM 40 MG/0.4ML ~~LOC~~ SOLN
40.0000 mg | Freq: Every day | SUBCUTANEOUS | Status: DC
  Administered 2020-03-09: 40 mg via SUBCUTANEOUS
  Filled 2020-03-09: qty 0.4

## 2020-03-09 MED ORDER — FENTANYL CITRATE (PF) 100 MCG/2ML IJ SOLN
25.0000 ug | Freq: Once | INTRAMUSCULAR | Status: AC
  Administered 2020-03-09: 25 ug via INTRAVENOUS

## 2020-03-09 MED ORDER — MIDAZOLAM HCL 2 MG/2ML IJ SOLN
2.0000 mg | INTRAMUSCULAR | Status: AC | PRN
  Administered 2020-03-09 – 2020-03-11 (×3): 2 mg via INTRAVENOUS
  Filled 2020-03-09 (×3): qty 2

## 2020-03-09 MED ORDER — SODIUM BICARBONATE 8.4 % IV SOLN
INTRAVENOUS | Status: AC
  Administered 2020-03-09: 50 meq
  Filled 2020-03-09: qty 50

## 2020-03-09 MED ORDER — PANTOPRAZOLE SODIUM 40 MG IV SOLR
40.0000 mg | Freq: Every day | INTRAVENOUS | Status: DC
  Administered 2020-03-09 – 2020-04-05 (×28): 40 mg via INTRAVENOUS
  Filled 2020-03-09 (×28): qty 40

## 2020-03-09 MED ORDER — INSULIN ASPART 100 UNIT/ML ~~LOC~~ SOLN
2.0000 [IU] | SUBCUTANEOUS | Status: DC
  Administered 2020-03-09: 4 [IU] via SUBCUTANEOUS
  Administered 2020-03-09: 2 [IU] via SUBCUTANEOUS
  Administered 2020-03-09 – 2020-03-10 (×3): 4 [IU] via SUBCUTANEOUS
  Administered 2020-03-10 (×2): 6 [IU] via SUBCUTANEOUS
  Administered 2020-03-10: 4 [IU] via SUBCUTANEOUS
  Administered 2020-03-11 (×2): 2 [IU] via SUBCUTANEOUS
  Administered 2020-03-11 (×3): 4 [IU] via SUBCUTANEOUS
  Administered 2020-03-12 – 2020-03-14 (×7): 2 [IU] via SUBCUTANEOUS
  Administered 2020-03-15 (×3): 4 [IU] via SUBCUTANEOUS

## 2020-03-09 MED ORDER — MIDAZOLAM HCL 2 MG/2ML IJ SOLN
2.0000 mg | Freq: Once | INTRAMUSCULAR | Status: AC
  Administered 2020-03-09: 2 mg via INTRAVENOUS

## 2020-03-09 MED ORDER — FENTANYL 2500MCG IN NS 250ML (10MCG/ML) PREMIX INFUSION
25.0000 ug/h | INTRAVENOUS | Status: DC
  Administered 2020-03-09: 25 ug/h via INTRAVENOUS
  Administered 2020-03-09: 50 ug/h via INTRAVENOUS
  Administered 2020-03-10 – 2020-03-11 (×2): 100 ug/h via INTRAVENOUS
  Administered 2020-03-12: 125 ug/h via INTRAVENOUS
  Administered 2020-03-13: 200 ug/h via INTRAVENOUS
  Administered 2020-03-13: 125 ug/h via INTRAVENOUS
  Administered 2020-03-14: 200 ug/h via INTRAVENOUS
  Administered 2020-03-14: 125 ug/h via INTRAVENOUS
  Administered 2020-03-15: 100 ug/h via INTRAVENOUS
  Administered 2020-03-16 (×2): 150 ug/h via INTRAVENOUS
  Administered 2020-03-17: 200 ug/h via INTRAVENOUS
  Administered 2020-03-17 – 2020-03-18 (×2): 100 ug/h via INTRAVENOUS
  Administered 2020-03-19: 150 ug/h via INTRAVENOUS
  Administered 2020-03-20: 125 ug/h via INTRAVENOUS
  Administered 2020-03-21 (×2): 150 ug/h via INTRAVENOUS
  Filled 2020-03-09 (×17): qty 250

## 2020-03-09 MED ORDER — IPRATROPIUM-ALBUTEROL 0.5-2.5 (3) MG/3ML IN SOLN
RESPIRATORY_TRACT | Status: AC
  Filled 2020-03-09: qty 3

## 2020-03-09 MED ORDER — FENTANYL CITRATE (PF) 100 MCG/2ML IJ SOLN
INTRAMUSCULAR | Status: AC
  Filled 2020-03-09: qty 2

## 2020-03-09 MED ORDER — METHYLPREDNISOLONE SODIUM SUCC 125 MG IJ SOLR
60.0000 mg | Freq: Four times a day (QID) | INTRAMUSCULAR | Status: DC
  Administered 2020-03-09 – 2020-03-10 (×4): 60 mg via INTRAVENOUS
  Filled 2020-03-09 (×3): qty 2

## 2020-03-09 MED ORDER — ADULT MULTIVITAMIN W/MINERALS CH: 1.0000 | ORAL_TABLET | Freq: Every day | ORAL | Status: DC

## 2020-03-09 MED ORDER — ENOXAPARIN SODIUM 30 MG/0.3ML ~~LOC~~ SOLN: 30.0000 mg | Freq: Every day | SUBCUTANEOUS | Status: DC

## 2020-03-09 MED ORDER — MIDAZOLAM HCL 2 MG/2ML IJ SOLN
INTRAMUSCULAR | Status: AC
  Filled 2020-03-09: qty 2

## 2020-03-09 NOTE — Progress Notes (Signed)
RN assessed colostomy and found the patients bed to be covered with a moderate amount of blood.  Ostomy has more then tripled in size and is dark red and black.  RN had to cut away previous colostomy bag and apply a new one, difficult to get a good seal due to size of ostomy.  Dr. Ninfa Linden updated on patients change in condition.  Dr. Chase Caller updated at bedside.  Pt. VSS with no s/s of distress noted.  Labs drawn and sent.  RN will continue to monitor.

## 2020-03-09 NOTE — Progress Notes (Signed)
ANTICOAGULATION CONSULT NOTE - Initial Consult  Pharmacy Consult for IV heparin Indication: rule out PE  Allergies  Allergen Reactions  . Codeine Swelling    Patient Measurements: Height: 5\' 2"  (157.5 cm) Weight: 156 lb 1.4 oz (70.8 kg) IBW/kg (Calculated) : 50.1 Heparin Dosing Weight: 64.75  Vital Signs: Temp: 97.2 F (36.2 C) (03/27 0337) Temp Source: Oral (03/27 0337) BP: 93/45 (03/27 0550) Pulse Rate: 92 (03/27 0550)  Labs: Recent Labs    03/07/20 0238 03/07/20 0238 03/08/20 0252 03/09/20 0517 03/09/20 0633  HGB 12.0   < > 13.9 14.5  --   HCT 37.5  --  42.2 45.3  --   PLT 251  --  256 357  --   HEPARINUNFRC  --   --   --   --  0.48  CREATININE 1.37*  --  1.12* 1.33*  --    < > = values in this interval not displayed.    Estimated Creatinine Clearance: 38.4 mL/min (A) (by C-G formula based on SCr of 1.33 mg/dL (H)).   Medical History: Past Medical History:  Diagnosis Date  . Asthma   . COPD (chronic obstructive pulmonary disease) ([place]) 2011   FeV1 31% predicted FeV1/FVX 47 %  . Hemorrhoid   . Osteopenia   . Seasonal allergies    takes Claritin daily prn  . Vitamin D deficiency    takes Vit d every 14 days    Medications:  Scheduled:  . acetaminophen  650 mg Oral Q6H  . Chlorhexidine Gluconate Cloth  6 each Topical Daily  . feeding supplement  1 Container Oral BID BM  . feeding supplement (PRO-STAT SUGAR FREE 64)  30 mL Oral TID BM  . umeclidinium bromide  1 puff Inhalation Daily   And  . mometasone-formoterol  2 puff Inhalation BID  . multivitamin with minerals  1 tablet Oral Daily  . saccharomyces boulardii  250 mg Oral BID  . sodium chloride flush  3 mL Intravenous Q12H   Infusions:  . sodium chloride    . dextrose 5 % and 0.45% NaCl 125 mL/hr at 03/09/20 0648  . heparin 1,000 Units/hr (03/08/20 2334)  . piperacillin-tazobactam (ZOSYN)  IV 3.375 g (03/09/20 0415)    Assessment: 69 yo female s/p emergent low anterior resection/colostomy  on 3/21 due to perforated sigmoid colon. Pt transferred to ICU on 3/26 with SOB/tachypnea. Pt received contrast on 3/26 for CT abd/pelvis, unable to receive more contrast at the time of transfer. Pharmacy consulted to dose/monitor heparin drip empirically for concern for PE. Pt had been on enoxaparin subQ for DVT ppx.   Today, 03/09/20  HL = 0.48 is therapeutic on heparin infusion of 1000 units/hr  CBC: WNL & stable  Confirmed with RN that heparin infusing at correct rate, no signs of bleeding.  SCr 1.33, CrCl ~38 mL/min  Goal of Therapy:  Heparin level 0.3-0.7 units/ml Monitor platelets by anticoagulation protocol: Yes   Plan:   Continue heparin infusion at current rate of 1000 units/hr  Check confirmatory HL in 6 hours  CBC, HL daily while on heparin drip  Monitor for signs/symptoms of bleeding  Follow up results of CT  Lenis Noon, PharmD 03/09/2020,8:12 AM

## 2020-03-09 NOTE — Progress Notes (Signed)
ANTICOAGULATION CONSULT NOTE -  Consult  Pharmacy Consult for IV heparin >> prophylactic Lovenox Indication: rule out PE  Allergies  Allergen Reactions  . Codeine Swelling    Patient Measurements: Height: 5' 2"  (157.5 cm) Weight: 156 lb 1.4 oz (70.8 kg) IBW/kg (Calculated) : 50.1 Heparin Dosing Weight: 64.75  Vital Signs: Temp: 97.2 F (36.2 C) (03/27 0337) Temp Source: Oral (03/27 0337) BP: 79/34 (03/27 0915) Pulse Rate: 46 (03/27 0915)  Labs: Recent Labs    03/07/20 0238 03/07/20 0238 03/08/20 0252 03/09/20 0517 03/09/20 6712 03/09/20 0654 03/09/20 0907  HGB 12.0   < > 13.9 14.5  --   --   --   HCT 37.5  --  42.2 45.3  --   --   --   PLT 251  --  256 357  --   --   --   HEPARINUNFRC  --   --   --   --  0.48  --   --   CREATININE 1.37*  --  1.12* 1.33*  --   --   --   TROPONINIHS  --   --   --   --   --  3 4   < > = values in this interval not displayed.    Estimated Creatinine Clearance: 38.4 mL/min (A) (by C-G formula based on SCr of 1.33 mg/dL (H)).   Medical History: Past Medical History:  Diagnosis Date  . Asthma   . COPD (chronic obstructive pulmonary disease) ([place]) 2011   FeV1 31% predicted FeV1/FVX 47 %  . Hemorrhoid   . Osteopenia   . Seasonal allergies    takes Claritin daily prn  . Vitamin D deficiency    takes Vit d every 14 days    Medications:  Scheduled:  . chlorhexidine gluconate (MEDLINE KIT)  15 mL Mouth Rinse BID  . Chlorhexidine Gluconate Cloth  6 each Topical Daily  . feeding supplement  1 Container Oral BID BM  . feeding supplement (PRO-STAT SUGAR FREE 64)  30 mL Oral TID BM  . fentaNYL      . fentaNYL (SUBLIMAZE) injection  25 mcg Intravenous Once  . insulin aspart  2-6 Units Subcutaneous Q4H  . ipratropium-albuterol  3 mL Nebulization Q4H  . mouth rinse  15 mL Mouth Rinse 10 times per day  . methylPREDNISolone (SOLU-MEDROL) injection  60 mg Intravenous Q6H  . midazolam      . multivitamin with minerals  1 tablet Oral  Daily  . pantoprazole (PROTONIX) IV  40 mg Intravenous Daily  . saccharomyces boulardii  250 mg Oral BID  . sodium bicarbonate      . sodium chloride flush  3 mL Intravenous Q12H   Infusions:  . sodium chloride    . dextrose 5 % and 0.45% NaCl 125 mL/hr at 03/09/20 0819  . fentaNYL infusion INTRAVENOUS    . heparin 1,000 Units/hr (03/09/20 0819)  . lactated ringers    . phenylephrine    . phenylephrine (NEO-SYNEPHRINE) Adult infusion    . piperacillin-tazobactam (ZOSYN)  IV Stopped (03/09/20 0815)    Assessment: 69 yo female s/p emergent low anterior resection/colostomy on 3/21 due to perforated sigmoid colon. Pt transferred to ICU on 3/26 with SOB/tachypnea. Pt received contrast on 3/26 for CT abd/pelvis, unable to receive more contrast at the time of transfer. Pharmacy consulted to dose/monitor heparin drip empirically for concern for PE. Pt had been on enoxaparin subQ for DVT ppx.   Today, 03/09/20  Heparin  drip has now been stopped, starting prophylactic Lovenox   CBC: WNL & stable  SCr 1.33, CrCl ~38 mL/min  Goal of Therapy:  Heparin level 0.3-0.7 units/ml Monitor platelets by anticoagulation protocol: Yes   Plan:   Lovenox 40 mg SQ daily   Monitor for signs/symptoms of bleeding  Monitor CBC, SCr    Royetta Asal, PharmD, BCPS 03/09/2020 10:43 AM

## 2020-03-09 NOTE — Progress Notes (Signed)
Notified Dr. Kae Heller around 0430 that the patient's manual bp was undetectable. Doppler systolic between [id number]. MD gave verbal order to give 1L NS. MD also gave verbal to get cbc and bmp now. Phlebotomy called back to the bedside. Pt still tachypneic and complaining of dyspnea. Sats mid 90's. RT at bedside to give neb. MD placed order for ABG.

## 2020-03-09 NOTE — Progress Notes (Signed)
RRT  arrived at 2122 followed up on pt status after informed of pt mews score elevation (approx 2032).  Found pt to be SOB and confused. See VS flow sheet.  Pt accessed which revealed crackles in the right lower lung fields.  Informed confusion is new for this pt.  PCXR complete.  02 increased to 4 L .  Pt received a breathing tx earlier.   MD made aware new orders received and initiated.  Pt to transfer to ICU per MD for closer evaluation.

## 2020-03-09 NOTE — Progress Notes (Signed)
6 Days Post-Op   Subjective/Chief Complaint: Events of night noted Patient reports continued SOB Denies abdominal pain   Objective: Vital signs in last 24 hours: Temp:  [97.2 F (36.2 C)-98.1 F (36.7 C)] 97.2 F (36.2 C) (03/27 0337) Pulse Rate:  [33-107] 92 (03/27 0550) Resp:  [16-36] 33 (03/27 0550) BP: (81-159)/(40-88) 93/45 (03/27 0550) SpO2:  [91 %-100 %] 94 % (03/27 0550) Weight:  [70.8 kg-72.3 kg] 70.8 kg (03/27 0416) Last BM Date: 03/09/20  Intake/Output from previous day: 03/26 0701 - 03/27 0700 In: 1730.2 [P.O.:240; I.V.:1322.4; IV Piggyback:167.7] Out: 1300 [Urine:650; Drains:200; Stool:450] Intake/Output this shift: No intake/output data recorded.  Exam: Awake and alert Following commands SOB Abdomen soft, ostomy productive, ischemic  Lab Results:  Recent Labs    03/08/20 0252 03/09/20 0517  WBC 22.0* 19.2*  HGB 13.9 14.5  HCT 42.2 45.3  PLT 256 357   BMET Recent Labs    03/08/20 0252 03/09/20 0517  NA 135 136  K 3.6 4.9  CL 94* 100  CO2 30 20*  GLUCOSE 105* 99  BUN 34* 35*  CREATININE 1.12* 1.33*  CALCIUM 8.3* 8.0*   PT/INR No results for input(s): LABPROT, INR in the last 72 hours. ABG Recent Labs    03/08/20 2240 03/09/20 0545  PHART 7.380 7.305*  HCO3 24.4 23.0    Studies/Results: CT ABDOMEN PELVIS W CONTRAST  Result Date: 03/08/2020 CLINICAL DATA:  Hyperkalemia, metabolic acidosis, history of perforated sigmoid colon and proximal rectal cancer with obstruction status post surgery, elevated white blood cell count EXAM: CT ABDOMEN AND PELVIS WITH CONTRAST TECHNIQUE: Multidetector CT imaging of the abdomen and pelvis was performed using the standard protocol following bolus administration of intravenous contrast. CONTRAST:  163mL OMNIPAQUE IOHEXOL 300 MG/ML  SOLN COMPARISON:  03/03/2020 FINDINGS: Lower chest: Diffuse emphysema is noted. Trace bilateral pleural effusions. The heart is unremarkable. Hepatobiliary: Indeterminate 1.4  cm subcapsular hypodensity left lobe liver segment 4A. In light of the newly diagnosed rectal cancer, metastatic disease cannot be excluded. Nuclear medicine PET scan may be useful. Nodular contour of the liver capsule unchanged consistent with cirrhosis. Gallbladder is unremarkable. No biliary dilation. Pancreas: Unremarkable. No pancreatic ductal dilatation or surrounding inflammatory changes. Spleen: Normal in size without focal abnormality. Adrenals/Urinary Tract: Scattered areas of renal cortical scarring are seen, left greater than right, stable. No obstructive uropathy. The bladder is unremarkable. Adrenals are normal. Stomach/Bowel: There is been interval distal colon resection with a diverting colostomy seen within the left mid abdomen. Moderate residual stool within the colon. No evidence of bowel obstruction. Dilated loops of small bowel with scattered small bowel gas fluid levels may reflect postoperative ileus. Vascular/Lymphatic: Minimal atherosclerosis of the aorta. No pathologic adenopathy within the abdomen or pelvis. Reproductive: Status post hysterectomy. No adnexal masses. Other: There is diffuse subcutaneous edema which has developed in the interim. Trace free fluid within the abdomen and pelvis. I do not see any fluid collection or abscess at this time. Small amount of gas is seen within the lower pelvis adjacent to an indwelling surgical drain. Postsurgical changes are seen from midline laparotomy with surgical packing in place. Musculoskeletal: No acute or destructive bony lesions. Reconstructed images demonstrate no additional findings. IMPRESSION: 1. Interval midline laparotomy with distal colon resection and diverting left lower quadrant colostomy. 2. Diffuse small bowel dilatation with gas fluid levels most consistent with postoperative ileus. 3. Trace ascites within the abdomen and pelvis. No fluid collection or abscess at this time. Surgical drain within the lower  pelvis. 4.  Indeterminate 1.4 cm subcapsular liver hypodensity. In light of newly diagnosed rectal cancer, metastatic disease cannot be excluded. PET CT may be useful for further evaluation. 5. Interval development of trace bilateral pleural effusions and diffuse body wall edema. Electronically Signed   By: Randa Ngo M.D.   On: 03/08/2020 11:32   DG CHEST PORT 1 VIEW  Result Date: 03/08/2020 CLINICAL DATA:  Shortness of breath. EXAM: PORTABLE CHEST 1 VIEW COMPARISON:  March 08, 2020 FINDINGS: Moderate severe emphysematous changes are noted bilaterally. There are small bilateral pleural effusions with elevation of the left hemidiaphragm. There is no focal infiltrate or pneumothorax. The heart size is normal. Aortic calcifications are noted. There is no acute osseous abnormality. IMPRESSION: 1. Moderate severe emphysematous changes. 2. Small bilateral pleural effusions. 3. No significant short interval change. Electronically Signed   By: Constance Holster M.D.   On: 03/08/2020 22:11   DG CHEST PORT 1 VIEW  Result Date: 03/08/2020 CLINICAL DATA:  Pain with nausea EXAM: PORTABLE CHEST 1 VIEW COMPARISON:  March 07, 2020. FINDINGS: There is stable scarring in the apices, more on the right than the left. Lungs elsewhere clear. Heart size and pulmonary vascularity are normal. No adenopathy. No bone lesions. IMPRESSION: Stable apical region scarring, more on the right than on the left. Lungs elsewhere clear. Cardiac silhouette within normal limits and stable. Electronically Signed   By: Lowella Grip III M.D.   On: 03/08/2020 13:27    Anti-infectives: Anti-infectives (From admission, onward)   Start     Dose/Rate Route Frequency Ordered Stop   03/06/20 1200  piperacillin-tazobactam (ZOSYN) IVPB 3.375 g     3.375 g 12.5 mL/hr over 240 Minutes Intravenous Every 8 hours 03/06/20 0739     03/05/20 1400  piperacillin-tazobactam (ZOSYN) IVPB 2.25 g  Status:  Discontinued     2.25 g 100 mL/hr over 30 Minutes  Intravenous Every 8 hours 03/05/20 0752 03/06/20 0739   03/04/20 2200  piperacillin-tazobactam (ZOSYN) IVPB 3.375 g  Status:  Discontinued     3.375 g 12.5 mL/hr over 240 Minutes Intravenous Every 8 hours 03/03/20 2032 03/05/20 0752   03/03/20 1545  cefoTEtan (CEFOTAN) 2 g in sodium chloride 0.9 % 100 mL IVPB     2 g 200 mL/hr over 30 Minutes Intravenous On call to O.R. 03/03/20 1532 03/04/20 0444   03/03/20 1345  piperacillin-tazobactam (ZOSYN) IVPB 3.375 g     3.375 g 12.5 mL/hr over 240 Minutes Intravenous  Once 03/03/20 1330 03/03/20 1507      Assessment/Plan: s/p Procedure(s): low anterior resection end colostomy (N/A)  Moved back to ICU for SOB.  CCM will be seeing her this morning Started on heparin in case this is a PE.  Creatinine up so CT of chest held as she just got contrast for abdominal CT yesterday. Does not look like volume overload on CXR.  She may be a little dry based on lytes.   LOS: 6 days    Coralie Keens 03/09/2020

## 2020-03-09 NOTE — Progress Notes (Signed)
CRITICAL VALUE ALERT  Critical Value:  Lactic Acid 2.3  Date & Time Notied:  03/09/20 @1350   Provider Notified: Dr. Chase Caller   Orders Received/Actions taken: New order received

## 2020-03-09 NOTE — Progress Notes (Signed)
Attempted lower extremity venous duplex, however sterile procedure was in progress at the bedside. Will attempt later as schedule permits.  03/09/2020 11:23 AM Maudry Mayhew, MHA, RVT, RDCS, RDMS

## 2020-03-09 NOTE — Consult Note (Addendum)
NAME:  Tracy [name], MRN:  [id number], DOB:  [birth year], LOS: 6 ADMISSION DATE:  03/03/2020, CONSULTATION DATE:  3/27/021 REFERRING MD:  - Dr Ninfa Linden of [facility], CHIEF COMPLAINT:  Post op copd consult   Brief History   x  History of present illness   69 year old former smoker with gold stage III-4 COPD FEV1 31% with postnasal drip.  Sees Dr. Leory Plowman Icard in the pulmonary office.  At baseline she is on Trelegy, Flonase and Claritin.  She is not on oxygen at baseline but husband reports class III dyspnea even for ADLs.  She presented on March 03, 2020 with bowel perforation and status post lower anterior resection and colostomy for proximal rectal cancer associated with Distal sigmoid perforation by Dr. Doreene Adas.  There was no obvious metastatic disease on the parietal peritoneum or liver.   Her postoperative course has been complicated by acute kidney injury that was improving from 2.2 mg percent on 03/06/2019 1 to 1.2 mg percent on 03/08/2020 [status post Foley removal 03/06/2020].  During rounds on the morning of 03/08/2020: White blood count is also trending down.  The stoma was felt to be improving she was getting intermittent opioids for pain and also symptomatic nausea treatment.  She also underwent CT abdomen with contrast that showed possible liver lesion along with postop ileus  On the night of March 08, 2020 she developed acute delirium and respiratory distress with desaturation.  Empiric IV heparin was started given concern for pulmonary embolism in the setting of cancer and being bedbound for 5 days [although she was getting Lovenox DVT prophylaxis even as of 03/04/20]. CCM consulted am of 03/09/20 for copd. PAtient found to be in severe resp distress with bilateral faint wheezing and not responding to ccm interventions of fentanyl, nebulizer, bipap, mag and solumedrol  BP low and fluids responsive  Past Medical History     has a past medical history of Asthma, COPD (chronic  obstructive pulmonary disease) ([place]) (2011), Hemorrhoid, Osteopenia, Seasonal allergies, and Vitamin D deficiency.   reports that she quit smoking about 20 years ago. Her smoking use included cigarettes. She has a 10.00 pack-year smoking history. She has never used smokeless tobacco.  Past Surgical History:  Procedure Laterality Date  . AUGMENTATION MAMMAPLASTY     saline  . BREAST ENHANCEMENT SURGERY    . EXAMINATION UNDER ANESTHESIA  10/14/2012   Procedure: EXAM UNDER ANESTHESIA;  Surgeon: Gayland Curry, MD,FACS;  Location: [place];  Service: General;  Laterality: N/A;  rectal exam under anesthesia excisional hemorrhoidectomy hemorrhoidal banding x two  . EXAMINATION UNDER ANESTHESIA  02/03/2013   excision hemorrhoidal tissue  . FOOT SURGERY     left bunionectomy  . HEMORRHOIDECTOMY WITH HEMORRHOID BANDING  10/14/2012   Procedure: [id number] WITH HEMORRHOID BANDING;  Surgeon: Gayland Curry, MD,FACS;  Location: [place];  Service: General;  Laterality: N/A;  . LAPAROTOMY N/A 03/03/2020   Procedure: low anterior resection end colostomy;  Surgeon: [name], MD;  Location: [facility];  Service: General;  Laterality: N/A;  . OPEN REDUCTION INTERNAL FIXATION (ORIF) DISTAL RADIAL FRACTURE Right 11/01/2018   Procedure: OPEN REDUCTION INTERNAL FIXATION (ORIF) DISTAL RADIAL FRACTURE;  Surgeon: [name], MD;  Location: [place];  Service: Orthopedics;  Laterality: Right;  . TONSILLECTOMY  age 61    recurrent otitis media    Allergies  Allergen Reactions  . Codeine Swelling    Immunization History  Administered Date(s) Administered  . Fluad Quad(high Dose 65+)  10/27/2019  . Influenza Split 01/06/2013, 09/13/2013  . Influenza, High Dose Seasonal PF 10/10/2018  . Influenza,inj,Quad PF,6+ Mos 09/13/2014, 10/15/2015  . Influenza,inj,Quad PF,6-35 Mos 10/26/2016  . Pneumococcal Conjugate-13 07/12/2014  . Pneumococcal Polysaccharide-23 01/06/2013, 10/10/2018  . Tdap  05/15/2013  . Zoster 07/14/2013    Family History  Problem Relation Age of Onset  . Osteoporosis Mother   . Other Father        [place] in snow, broke ankle, blood clot to lungs  . Heart murmur Sister   . Other Brother        h/o being hit by lightening  . Parkinsonism Brother   . Asthma Son   . Allergies Son   . Otitis media Son   . Other Sister        CML  . Asthma Son   . Allergies Son   . Pancreatic cancer Brother        diag 12/09/13-deceased 02-08-14     Current Facility-Administered Medications:  .  0.9 %  sodium chloride infusion, 250 mL, Intravenous, PRN, Rayburn, Kelly A, PA-C .  acetaminophen (TYLENOL) tablet 650 mg, 650 mg, Oral, Q6H, Romana Juniper A, MD, 650 mg at 03/09/20 0606 .  albuterol (PROVENTIL) (2.5 MG/3ML) 0.083% nebulizer solution 2.5 mg, 2.5 mg, Nebulization, Q6H PRN, Rayburn, Kelly A, PA-C, 2.5 mg at 03/09/20 0826 .  alum & mag hydroxide-simeth (MAALOX/MYLANTA) 200-200-20 MG/5ML suspension 30 mL, 30 mL, Oral, Q6H PRN, Rayburn, Kelly A, PA-C .  Chlorhexidine Gluconate Cloth 2 % PADS 6 each, 6 each, Topical, Daily, Rayburn, Kelly A, PA-C, 6 each at 03/08/20 0847 .  dextrose 5 %-0.45 % sodium chloride infusion, , Intravenous, Continuous, Romana Juniper A, MD, Last Rate: 125 mL/hr at 03/09/20 0819, Rate Verify at 03/09/20 0819 .  diphenhydrAMINE (BENADRYL) 12.5 MG/5ML elixir 12.5 mg, 12.5 mg, Oral, Q6H PRN **OR** diphenhydrAMINE (BENADRYL) injection 12.5 mg, 12.5 mg, Intravenous, Q6H PRN, Rayburn, Kelly A, PA-C, 12.5 mg at 03/04/20 1810 .  feeding supplement (BOOST / RESOURCE BREEZE) liquid 1 Container, 1 Container, Oral, BID BM, Rayburn, Kelly A, PA-C, 1 Container at 03/06/20 1015 .  feeding supplement (PRO-STAT SUGAR FREE 64) liquid 30 mL, 30 mL, Oral, TID BM, Rayburn, Kelly A, PA-C, 30 mL at 03/06/20 2000 .  haloperidol lactate (HALDOL) injection 2 mg, 2 mg, Intravenous, Q6H PRN, Romana Juniper A, MD, 2 mg at 03/08/20 2341 .  heparin ADULT infusion 100  units/mL (25000 units/288mL sodium chloride 0.45%), 1,000 Units/hr, Intravenous, Continuous, Adrian Saran, [facility], Last Rate: 10 mL/hr at 03/09/20 0819, 1,000 Units/hr at 03/09/20 0819 .  metoprolol tartrate (LOPRESSOR) injection 5 mg, 5 mg, Intravenous, Q6H PRN, Rayburn, Kelly A, PA-C .  umeclidinium bromide (INCRUSE ELLIPTA) 62.5 MCG/INH 1 puff, 1 puff, Inhalation, Daily, 1 puff at 03/07/20 0730 **AND** mometasone-formoterol (DULERA) 100-5 MCG/ACT inhaler 2 puff, 2 puff, Inhalation, BID, Rayburn, Kelly A, PA-C, 2 puff at 03/07/20 2004 .  multivitamin with minerals tablet 1 tablet, 1 tablet, Oral, Daily, Rayburn, Kelly A, PA-C, 1 tablet at 03/06/20 1008 .  ondansetron (ZOFRAN) tablet 4 mg, 4 mg, Oral, Q6H PRN **OR** ondansetron (ZOFRAN) injection 4 mg, 4 mg, Intravenous, Q6H PRN, Rayburn, Kelly A, PA-C, 4 mg at 03/08/20 1518 .  phenol (CHLORASEPTIC) mouth spray 1 spray, 1 spray, Mouth/Throat, PRN, Rayburn, Kelly A, PA-C .  piperacillin-tazobactam (ZOSYN) IVPB 3.375 g, 3.375 g, Intravenous, Q8H, Rayburn, Kelly A, PA-C, Stopped at 03/09/20 0815 .  saccharomyces boulardii (FLORASTOR) capsule 250 mg, 250 mg, Oral,  BID, Rayburn, Floyce Stakes, PA-C, 250 mg at 03/08/20 2336 .  sodium chloride flush (NS) 0.9 % injection 3 mL, 3 mL, Intravenous, Q12H, Rayburn, Kelly A, PA-C, 3 mL at 03/08/20 2343 .  sodium chloride flush (NS) 0.9 % injection 3 mL, 3 mL, Intravenous, PRN, Rayburn, Kelly A, PA-C, 3 mL at 03/07/20 0949 .  traMADol (ULTRAM) tablet 50 mg, 50 mg, Oral, Q6H PRN, Rayburn, Kelly A, PA-C, 50 mg at 03/08/20 [facility] Events   03/03/2020 - admitt - bowel perforation and status post lower anterior resection and colostomy for proximal rectal cancer associated with Distal sigmoid perforation by  3/23 - renal consult  3/27 - ccm consult  Consults:  3/23 - renal consult  3/27 - ccm consult  Procedures:  x  Significant Diagnostic Tests:  3/21 - rectal pat  Micro Data:   x  Antimicrobials:   Zosyn 3/21 >>      Interim history/subjective:  3/27 - seen by CCM bed [address]  Objective   Blood pressure 101/71, pulse 92, temperature (!) 97.2 F (36.2 C), temperature source Oral, resp. rate (!) 33, height 5\' 2"  (1.575 m), weight 70.8 kg, last menstrual period 12/14/2000, SpO2 92 %.        Intake/Output Summary (Last 24 hours) at 03/09/2020 0839 Last data filed at 03/09/2020 0819 Gross per 24 hour  Intake 2911.2 ml  Output 1300 ml  Net 1611.2 ml   Filed Weights   03/08/20 0500 03/08/20 2321 03/09/20 0416  Weight: 69.9 kg 72.3 kg 70.8 kg    Examination: General: frail female,  HENT: bipap on Lungs: Barrell chest, faint wheeze, RR 30, diaphoretic, unable to comploete past few words, Acc muscle +. No paradoxus Cardiovascular: tachy. Normal heart soudns Abdomen: soft, Left side ostomy drain - STOMA NOt PINK Extremities: intac Neuro: alert and oriented x 3. Reports full code GU: not [id number]  Resolved Hospital Problem list   x  Assessment & Plan:  ASSESSMENT / PLAN:  PULMONARY  A:  Baseline Gold stage III/IV COPD but not on home oxygen -follows with Icard Currently severe acute hypoxemic respiratory distress and failure with wheezing -following night of delirium   -Empiric heparin started by surgery on presumption of pulmonary embolism although she did get Lovenox since 03/04/2020  -HCAP and COPD exacerbation other more likely etiologies  = Failing BiPAP  P:   Proceed with intubation Mechanical ventilator support with sedation Need to allow for permissive hypercapnia and prevent air trapping Steroids Bronchodilators Bic if needed for ph </= 7.20   NEUROLOGIC A:   Acute delirium 03/08/2020 late p.m. the setting of opioids and COPD and postop  P:   Deep sedation on the ventilator RASS sedation goal -3    VASCULAR A:   Hypotensive on 03/09/2020  P:  Fluid bolus Neo via PIV and then consider levophed once CVL access  established MAP goal > 65  CARDIAC STRUCTURAL A: At risk for cardiac structural issues  P: Echocardiogram Cycle troponin  CARDIAC ELECTRICAL A: At risk for atrial fibrillation and multifocal atrial tachycardia   P: Twelve-lead EKG Monitor  INFECTIOUS A:   Peritonitis and on Zosyn since 03/03/2020  -On 03/09/2020: Afebrile since 03/05/2020 reducing white count  P:   Continue Zosyn Panculture Check sepsis biomarkers  RENAL A:  Acute kidney injury on 03/04/2020.  Improved and near normalized on 03/08/2020 and and status post CT abdomen with dye load with slight worsening with creatinine 1.33 mg percent on  03/09/20.  At risk for severe kidney injury   P:  Fluid bolus Avoid nephrotoxins Keep up with hemodynamics foley  ELECTROLYTES  A:  At risk for electrolyte imbalance P: Monitor and replete as appropriate   GASTROINTESTINAL A:   Status post sigmoid perforation from rectal cancer and status post surgery  03/09/20 - stoma does not look pink  P:   Per [facility]  HEMATOLOGIC   - HEME A:  At risk anemia of icu   P:  - PRBC for hgb </= 6.9gm%    - exceptions are   -  if ACS susepcted/confirmed then transfuse for hgb </= 8.0gm%,  or    -  active bleeding with hemodynamic instability, then transfuse regardless of hemoglobin value   At at all times try to transfuse 1 unit prbc as possible with exception of active hemorrhage   HEMATOLOGIC - Platelets A At risk thrombocytopenia  Doubt PE  P Dc heparin gtt Get duplex LE  restart SQ lovenox  ENDOCRINE A:   At risk hyperglycemia   P:   ssi    Best practice:  Diet: npo Pain/Anxiety/Delirium protocol (if indicated): deep sedation VAP protocol (if indicated): HOB > 30 DVT prophylaxis: lvoenox GI prophylaxis: ppi Glucose control: ssi Mobility: bed rest Code Status: full code Family Communication: patient and husband at bedsid4 Disposition: ICU    ATTESTATION & SIGNATURE   The patient Tracy [name] is critically ill with multiple organ systems failure and requires high complexity decision making for assessment and support, frequent evaluation and titration of therapies, application of advanced monitoring technologies and extensive interpretation of multiple databases.   Critical Care Time devoted to patient care services described in this note is75  Minutes. This time reflects time of care of this signee Dr Brand Males. This critical care time does not reflect procedure time, or teaching time or supervisory time of PA/NP/Med student/Med Resident etc but could involve care discussion time     Dr. Brand Males, M.D., [facility].C.P Pulmonary and Critical Care Medicine Staff Physician [place] [facility] Pager: [phone], If no answer or between  15:00h - 7:00h: call [phone]  03/09/2020 8:40 AM     LABS    PULMONARY Recent Labs  Lab 03/08/20 2240 03/09/20 0545  PHART 7.380 7.305*  PCO2ART 42.0 47.1  PO2ART 67.6* 63.3*  HCO3 24.4 23.0  O2SAT 92.2 90.2    CBC Recent Labs  Lab 03/07/20 0238 03/08/20 0252 03/09/20 0517  HGB 12.0 13.9 14.5  HCT 37.5 42.2 45.3  WBC 17.7* 22.0* 19.2*  PLT 251 256 357    COAGULATION No results for input(s): INR in the last 168 hours.  CARDIAC  No results for input(s): TROPONINI in the last 168 hours. No results for input(s): PROBNP in the last 168 hours.   CHEMISTRY Recent Labs  Lab 03/05/20 1446 03/05/20 1446 03/06/20 0236 03/06/20 0236 03/07/20 0238 03/07/20 0238 03/08/20 0252 03/09/20 0517  NA 136  --  136  --  133*  --  135 136  K 3.8   < > 3.5   < > 3.0*   < > 3.6 4.9  CL 102  --  98  --  94*  --  94* 100  CO2 21*  --  25  --  29  --  30 20*  GLUCOSE 241*  --  199*  --  131*  --  105* 99  BUN 45*  --  46*  --  41*  --  34* 35*  CREATININE 2.29*  --  2.11*  --  1.37*  --  1.12* 1.33*  CALCIUM 7.9*  --  7.9*  --  8.0*  --  8.3* 8.0*  PHOS 4.7*  --   --   --    --   --   --   --    < > = values in this interval not displayed.   Estimated Creatinine Clearance: 38.4 mL/min (A) (by C-G formula based on SCr of 1.33 mg/dL (H)).   LIVER Recent Labs  Lab 03/03/20 1214 03/05/20 1446  AST 23  --   ALT 13  --   ALKPHOS 67  --   BILITOT 1.4*  --   PROT 7.1  --   ALBUMIN 3.9 2.4*     INFECTIOUS Recent Labs  Lab 03/03/20 1215 03/04/20 0758 03/08/20 0816  LATICACIDVEN 3.7* 2.5* 1.2     ENDOCRINE CBG (last 3)  No results for input(s): GLUCAP in the last 72 hours.       IMAGING x48h  - image(s) personally visualized  -   highlighted in bold CT ABDOMEN PELVIS W CONTRAST  Result Date: 03/08/2020 CLINICAL DATA:  Hyperkalemia, metabolic acidosis, history of perforated sigmoid colon and proximal rectal cancer with obstruction status post surgery, elevated white blood cell count EXAM: CT ABDOMEN AND PELVIS WITH CONTRAST TECHNIQUE: Multidetector CT imaging of the abdomen and pelvis was performed using the standard protocol following bolus administration of intravenous contrast. CONTRAST:  117mL OMNIPAQUE IOHEXOL 300 MG/ML  SOLN COMPARISON:  03/03/2020 FINDINGS: Lower chest: Diffuse emphysema is noted. Trace bilateral pleural effusions. The heart is unremarkable. Hepatobiliary: Indeterminate 1.4 cm subcapsular hypodensity left lobe liver segment 4A. In light of the newly diagnosed rectal cancer, metastatic disease cannot be excluded. Nuclear medicine PET scan may be useful. Nodular contour of the liver capsule unchanged consistent with cirrhosis. Gallbladder is unremarkable. No biliary dilation. Pancreas: Unremarkable. No pancreatic ductal dilatation or surrounding inflammatory changes. Spleen: Normal in size without focal abnormality. Adrenals/Urinary Tract: Scattered areas of renal cortical scarring are seen, left greater than right, stable. No obstructive uropathy. The bladder is unremarkable. Adrenals are normal. Stomach/Bowel: There is been interval  distal colon resection with a diverting colostomy seen within the left mid abdomen. Moderate residual stool within the colon. No evidence of bowel obstruction. Dilated loops of small bowel with scattered small bowel gas fluid levels may reflect postoperative ileus. Vascular/Lymphatic: Minimal atherosclerosis of the aorta. No pathologic adenopathy within the abdomen or pelvis. Reproductive: Status post hysterectomy. No adnexal masses. Other: There is diffuse subcutaneous edema which has developed in the interim. Trace free fluid within the abdomen and pelvis. I do not see any fluid collection or abscess at this time. Small amount of gas is seen within the lower pelvis adjacent to an indwelling surgical drain. Postsurgical changes are seen from midline laparotomy with surgical packing in place. Musculoskeletal: No acute or destructive bony lesions. Reconstructed images demonstrate no additional findings. IMPRESSION: 1. Interval midline laparotomy with distal colon resection and diverting left lower quadrant colostomy. 2. Diffuse small bowel dilatation with gas fluid levels most consistent with postoperative ileus. 3. Trace ascites within the abdomen and pelvis. No fluid collection or abscess at this time. Surgical drain within the lower pelvis. 4. Indeterminate 1.4 cm subcapsular liver hypodensity. In light of newly diagnosed rectal cancer, metastatic disease cannot be excluded. PET CT may be useful for further evaluation. 5. Interval development of  trace bilateral pleural effusions and diffuse body wall edema. Electronically Signed   By: Randa Ngo M.D.   On: 03/08/2020 11:32   DG CHEST PORT 1 VIEW  Result Date: 03/08/2020 CLINICAL DATA:  Shortness of breath. EXAM: PORTABLE CHEST 1 VIEW COMPARISON:  March 08, 2020 FINDINGS: Moderate severe emphysematous changes are noted bilaterally. There are small bilateral pleural effusions with elevation of the left hemidiaphragm. There is no focal infiltrate or  pneumothorax. The heart size is normal. Aortic calcifications are noted. There is no acute osseous abnormality. IMPRESSION: 1. Moderate severe emphysematous changes. 2. Small bilateral pleural effusions. 3. No significant short interval change. Electronically Signed   By: Constance Holster M.D.   On: 03/08/2020 22:11   DG CHEST PORT 1 VIEW  Result Date: 03/08/2020 CLINICAL DATA:  Pain with nausea EXAM: PORTABLE CHEST 1 VIEW COMPARISON:  March 07, 2020. FINDINGS: There is stable scarring in the apices, more on the right than the left. Lungs elsewhere clear. Heart size and pulmonary vascularity are normal. No adenopathy. No bone lesions. IMPRESSION: Stable apical region scarring, more on the right than on the left. Lungs elsewhere clear. Cardiac silhouette within normal limits and stable. Electronically Signed   By: Lowella Grip III M.D.   On: 03/08/2020 13:27

## 2020-03-09 NOTE — Progress Notes (Signed)
Bilateral lower extremity venous duplex completed. Refer to "CV Proc" under chart review to view preliminary results.  03/09/2020 1:48 PM Kelby Aline., MHA, RVT, RDCS, RDMS

## 2020-03-09 NOTE — Progress Notes (Signed)
eLink Physician-Brief Progress Note Patient Name: Tracy [name] DOB: May 24, 1951 MRN: [id number]   Date of Service  03/09/2020  HPI/Events of Note  Pt with a history of asthma/ COPD per her surgeon, she is s/p Hartman's procedure for rectal cancer. Pt's surgeon requesting pulmonary consultation.  eICU Interventions  Patient added to the list.        Kerry Kass Stevi [place] 03/09/2020, 6:27 AM

## 2020-03-09 NOTE — Procedures (Signed)
Central Venous Catheter Insertion Procedure Note Tracy [name] [id number] [birth year]  Procedure: Insertion of Central Venous Catheter Indications: Drug and/or fluid administration ad circulatory shock  Procedure Details Consent: Risks of procedure as well as the alternatives and risks of each were explained to the (patient/caregiver).  Consent for procedure obtained. Time Out: Verified patient identification, verified procedure, site/side was marked, verified correct patient position, special equipment/implants available, medications/allergies/relevent history reviewed, required imaging and test results available.  Performed  Maximum sterile technique was used including antiseptics, cap, gloves, gown, hand hygiene, mask and sheet. Skin prep: Chlorhexidine; local anesthetic administered A antimicrobial bonded/coated triple lumen catheter was placed in the left internal jugular vein using the Seldinger technique.  Evaluation Blood flow good Complications: No apparent complications Patient did tolerate procedure well. Chest X-ray ordered to verify placement.  CXR: pending   Real time 2D ultrasound used for vein site selection, patency assessment, and needle entry. / A record of image was made but could not be submitted for filing due to malfunction of printing device .  SIGNATURE    Dr. Brand Males, M.D., F.C.C.P,  Pulmonary and Critical Care Medicine Staff Physician, [place] Director - Interstitial Lung Disease  Program  Pulmonary [place] [facility] [place], [place], [address]  Pager: [phone], If no answer or between  15:00h - 7:00h: call [phone] Telephone: [phone]  11:41 AM 03/09/2020

## 2020-03-09 NOTE — Procedures (Signed)
Intubation Procedure Note Tracy [name] [id number] [birth year]  Procedure: Intubation Indications: Respiratory insufficiency  Procedure Details Consent: Risks of procedure as well as the alternatives and risks of each were explained to the (patient/caregiver).  Consent for procedure obtained. - emergent verbal consent with patient and husband  Time Out: Verified patient identification, verified procedure, site/side was marked, verified correct patient position, special equipment/implants available, medications/allergies/relevent history reviewed, required imaging and test results available.  Performed  Maximum sterile technique was used including cap, gloves, gown, hand hygiene and mask.  MAC  Mac 3 Glidescope. Grade 1 view. Easy intubation. No aspiration. SEcureed at 23cm. Good AE both sides. Pleural sliding sign present  Prior to intubation - fluid bolus, fent , versed, etomidate, roc and bic bolus given.     Evaluation Hemodynamic Status: Persistent hypotension treated with pressors; O2 sats: stable throughout Patient's Current Condition: stable Complications: No apparent complications Patient did tolerate procedure well. Chest X-ray ordered to verify placement.  CXR: pending.     SIGNATURE    Dr. Brand Males, M.D., F.C.C.P,  Pulmonary and Critical Care Medicine Staff Physician, [place] Director - Interstitial Lung Disease  Program  Pulmonary [place] at [place], [place], [address]  Pager: [phone], If no answer or between  15:00h - 7:00h: call [phone] Telephone: [phone]  10:01 AM 03/09/2020

## 2020-03-09 NOTE — Progress Notes (Signed)
RT advanced ET TUBE 1cm per doctors orders.

## 2020-03-09 NOTE — Progress Notes (Signed)
Results for ZAMORA, ELFERING (MRN [id number]) as of 03/09/2020 06:08  Ref. Range 03/09/2020 05:45  O2 Content Latest Units: L/min 3 L  pH, Arterial Latest Ref Range: 7.350 - 7.450  7.305 (L)  pCO2 arterial Latest Ref Range: 32.0 - 48.0 mmHg 47.1  pO2, Arterial Latest Ref Range: 83.0 - 108.0 mmHg 63.3 (L)  Acid-base deficit Latest Ref Range: 0.0 - 2.0 mmol/L 3.3 (H)  Bicarbonate Latest Ref Range: 20.0 - 28.0 mmol/L 23.0  O2 Saturation Latest Units: % 90.2  Patient temperature Unknown 97.2  Collection site Unknown LEFT BRACHIAL

## 2020-03-09 NOTE — Progress Notes (Signed)
PM rounds update   Called earlier for prolapsed ostomy assistant with hematoma and some bleeding.  I had Dr. Ninfa Linden reviewed the patient at the bedside.  Also used this opportunity to review labsso far  Earlier in the morning IV heparin was stopped  -Duplex lower extremity without any DVT.   -Echocardiogram with severe right ventricular dysfunction -echocardiogram "concern for PE or cor pulmonale.  - still on neo  - Other labs indice  - worsening AKI - sepsis and dye load - high PCT 35 c/w sepsis - persistent lactic acidosis - shock liver   Plan - repeat fluid bolus  - track labs orded by CCS - change neo to levophed - dc lopressor prn  Goals - husband updated about bad trajectory over next few days. Explained poor prognosis. He asked if he should prepare rest of family for mortality - I said yes. He is contemplating goals. - he said he and wife have been  Preparing for bad outcome this admission.     Antibiotics Given (last 72 hours)    Date/Time Action Medication Dose Rate   03/06/20 2001 New Bag/Given   piperacillin-tazobactam (ZOSYN) IVPB 3.375 g 3.375 g 12.5 mL/hr   03/07/20 0404 New Bag/Given   piperacillin-tazobactam (ZOSYN) IVPB 3.375 g 3.375 g 12.5 mL/hr   03/07/20 1131 New Bag/Given   piperacillin-tazobactam (ZOSYN) IVPB 3.375 g 3.375 g 12.5 mL/hr   03/07/20 2018 New Bag/Given   piperacillin-tazobactam (ZOSYN) IVPB 3.375 g 3.375 g 12.5 mL/hr   03/08/20 [id number] New Bag/Given   piperacillin-tazobactam (ZOSYN) IVPB 3.375 g 3.375 g 12.5 mL/hr   03/08/20 1146 New Bag/Given   piperacillin-tazobactam (ZOSYN) IVPB 3.375 g 3.375 g 12.5 mL/hr   03/08/20 2036 New Bag/Given   piperacillin-tazobactam (ZOSYN) IVPB 3.375 g 3.375 g 12.5 mL/hr   03/09/20 0415 New Bag/Given   piperacillin-tazobactam (ZOSYN) IVPB 3.375 g 3.375 g 12.5 mL/hr   03/09/20 1256 New Bag/Given   piperacillin-tazobactam (ZOSYN) IVPB 3.375 g 3.375 g 12.5 mL/hr      additonal 60 min critical care  time   SIGNATURE    Dr. Brand Males, M.D., F.C.C.P,  Pulmonary and Critical Care Medicine Staff Physician, [place] Director - Interstitial Lung Disease  Program  Pulmonary [place] [facility] [place], [place], [address]  Pager: [phone], If no answer or between  15:00h - 7:00h: call [phone] Telephone: [phone]  7:41 PM 03/09/2020    [facility]  Lab 03/08/20 2240 03/09/20 0545 03/09/20 1005  PHART 7.380 7.305* 7.330*  PCO2ART 42.0 47.1 46.1  PO2ART 67.6* 63.3* 260*  HCO3 24.4 23.0 23.9  O2SAT 92.2 90.2 99.3    CBC Recent Labs  Lab 03/09/20 0517 03/09/20 1307 03/09/20 1752  HGB 14.5 12.5 11.5*  HCT 45.3 39.3 36.3  WBC 19.2* 26.8* PENDING  PLT 357 336 385    COAGULATION Recent Labs  Lab 03/09/20 1752  INR 1.3*     CARDIAC  No results for input(s): TROPONINI in the last 168 hours. No results for input(s): PROBNP in the last 168 hours.   CHEMISTRY Recent Labs  Lab 03/05/20 1446 03/06/20 0236 03/07/20 0238 03/07/20 0238 03/08/20 0252 03/08/20 0252 03/09/20 0517 03/09/20 0517 03/09/20 1307 03/09/20 1752  NA 136   < > 133*  --  135  --  136  --  134* 136  K 3.8   < > 3.0*   < >  3.6   < > 4.9   < > 4.3 4.6  CL 102   < > 94*  --  94*  --  100  --  99 100  CO2 21*   < > 29  --  30  --  20*  --  23 22  GLUCOSE 241*   < > 131*  --  105*  --  99  --  144* 195*  BUN 45*   < > 41*  --  34*  --  35*  --  38* 42*  CREATININE 2.29*   < > 1.37*  --  1.12*  --  1.33*  --  1.60* 1.74*  CALCIUM 7.9*   < > 8.0*  --  8.3*  --  8.0*  --  7.3* 7.2*  PHOS 4.7*  --   --   --   --   --   --   --   --   --    < > = values in this interval not displayed.   Estimated Creatinine Clearance: 29.3 mL/min (A) (by C-G formula based on SCr of 1.74 mg/dL (H)).   LIVER Recent Labs  Lab 03/03/20 1214 03/05/20 1446 03/09/20 1307 03/09/20 1752  AST 23  --  429* 440*  ALT 13   --  131* 152*  ALKPHOS 67  --  61 57  BILITOT 1.4*  --  1.3* 1.0  PROT 7.1  --  4.4* 4.2*  ALBUMIN 3.9 2.4* 1.6* 1.8*  INR  --   --   --  1.3*     INFECTIOUS Recent Labs  Lab 03/08/20 0816 03/09/20 1307 03/09/20 1749  LATICACIDVEN 1.2 2.3* 2.4*  PROCALCITON  --  34.99  --      ENDOCRINE CBG (last 3)  Recent Labs    03/09/20 1417 03/09/20 1723  GLUCAP 104* 189*         IMAGING x48h  - image(s) personally visualized  -   highlighted in bold DG Abd 1 View  Result Date: 03/09/2020 CLINICAL DATA:  OG tube placement EXAM: ABDOMEN - 1 VIEW COMPARISON:  None. FINDINGS: A feeding tube is identified with distal tip in the proximal stomach. Air is noted in the colon. IMPRESSION: Feeding tube identified with distal tip in the proximal stomach. Electronically Signed   By: Abelardo Diesel M.D.   On: 03/09/2020 12:34   CT ABDOMEN PELVIS W CONTRAST  Result Date: 03/08/2020 CLINICAL DATA:  Hyperkalemia, metabolic acidosis, history of perforated sigmoid colon and proximal rectal cancer with obstruction status post surgery, elevated white blood cell count EXAM: CT ABDOMEN AND PELVIS WITH CONTRAST TECHNIQUE: Multidetector CT imaging of the abdomen and pelvis was performed using the standard protocol following bolus administration of intravenous contrast. CONTRAST:  155mL OMNIPAQUE IOHEXOL 300 MG/ML  SOLN COMPARISON:  03/03/2020 FINDINGS: Lower chest: Diffuse emphysema is noted. Trace bilateral pleural effusions. The heart is unremarkable. Hepatobiliary: Indeterminate 1.4 cm subcapsular hypodensity left lobe liver segment 4A. In light of the newly diagnosed rectal cancer, metastatic disease cannot be excluded. Nuclear medicine PET scan may be useful. Nodular contour of the liver capsule unchanged consistent with cirrhosis. Gallbladder is unremarkable. No biliary dilation. Pancreas: Unremarkable. No pancreatic ductal dilatation or surrounding inflammatory changes. Spleen: Normal in size without  focal abnormality. Adrenals/Urinary Tract: Scattered areas of renal cortical scarring are seen, left greater than right, stable. No obstructive uropathy. The bladder is unremarkable. Adrenals are normal. Stomach/Bowel: There is been interval distal colon resection with  a diverting colostomy seen within the left mid abdomen. Moderate residual stool within the colon. No evidence of bowel obstruction. Dilated loops of small bowel with scattered small bowel gas fluid levels may reflect postoperative ileus. Vascular/Lymphatic: Minimal atherosclerosis of the aorta. No pathologic adenopathy within the abdomen or pelvis. Reproductive: Status post hysterectomy. No adnexal masses. Other: There is diffuse subcutaneous edema which has developed in the interim. Trace free fluid within the abdomen and pelvis. I do not see any fluid collection or abscess at this time. Small amount of gas is seen within the lower pelvis adjacent to an indwelling surgical drain. Postsurgical changes are seen from midline laparotomy with surgical packing in place. Musculoskeletal: No acute or destructive bony lesions. Reconstructed images demonstrate no additional findings. IMPRESSION: 1. Interval midline laparotomy with distal colon resection and diverting left lower quadrant colostomy. 2. Diffuse small bowel dilatation with gas fluid levels most consistent with postoperative ileus. 3. Trace ascites within the abdomen and pelvis. No fluid collection or abscess at this time. Surgical drain within the lower pelvis. 4. Indeterminate 1.4 cm subcapsular liver hypodensity. In light of newly diagnosed rectal cancer, metastatic disease cannot be excluded. PET CT may be useful for further evaluation. 5. Interval development of trace bilateral pleural effusions and diffuse body wall edema. Electronically Signed   By: Randa Ngo M.D.   On: 03/08/2020 11:32   DG CHEST PORT 1 VIEW  Result Date: 03/09/2020 CLINICAL DATA:  Endotracheal tube and central  line placement EXAM: PORTABLE CHEST 1 VIEW COMPARISON:  March 08, 2020 FINDINGS: In tracheal tube is identified distal tip 4.5 cm from carina. Nasogastric tube is identified distal tip not included on the film but is at least in the stomach. Left central venous line is identified with distal tip in the superior vena cava. Emphysematous changes of bilateral lungs are identified. Small left pleural effusion is noted. Mild patchy opacity is identified the medial bilateral upper lobes and lateral left upper lobe. Bony structures are stable. IMPRESSION: 1. Endotracheal tube distal tip 4.5 cm from carina. 2. Left central venous line is identified with distal tip in the superior vena cava. No pneumothorax. 3. Opacities of bilateral lungs, developing pneumonia is not excluded. Electronically Signed   By: Abelardo Diesel M.D.   On: 03/09/2020 12:32   DG CHEST PORT 1 VIEW  Result Date: 03/08/2020 CLINICAL DATA:  Shortness of breath. EXAM: PORTABLE CHEST 1 VIEW COMPARISON:  March 08, 2020 FINDINGS: Moderate severe emphysematous changes are noted bilaterally. There are small bilateral pleural effusions with elevation of the left hemidiaphragm. There is no focal infiltrate or pneumothorax. The heart size is normal. Aortic calcifications are noted. There is no acute osseous abnormality. IMPRESSION: 1. Moderate severe emphysematous changes. 2. Small bilateral pleural effusions. 3. No significant short interval change. Electronically Signed   By: Constance Holster M.D.   On: 03/08/2020 22:11   DG CHEST PORT 1 VIEW  Result Date: 03/08/2020 CLINICAL DATA:  Pain with nausea EXAM: PORTABLE CHEST 1 VIEW COMPARISON:  March 07, 2020. FINDINGS: There is stable scarring in the apices, more on the right than the left. Lungs elsewhere clear. Heart size and pulmonary vascularity are normal. No adenopathy. No bone lesions. IMPRESSION: Stable apical region scarring, more on the right than on the left. Lungs elsewhere clear. Cardiac  silhouette within normal limits and stable. Electronically Signed   By: Lowella Grip III M.D.   On: 03/08/2020 13:27   ECHOCARDIOGRAM COMPLETE  Result Date: 03/09/2020  ECHOCARDIOGRAM REPORT   Patient Name:   Tracy [name] Date of Exam: 03/09/2020 Medical Rec #:  [id number]        Height:       62.0 in Accession #:    [id number]       Weight:       156.1 lb Date of Birth:  05/11/1951        BSA:          1.721 m Patient Age:    31 years         BP:           107/53 mmHg Patient Gender: F                HR:           92 bpm. Exam Location:  Inpatient Procedure: 2D Echo Indications:    518.82 acute respiratory insufficiency  History:        Patient has no prior history of Echocardiogram examinations.                 COPD.  Sonographer:    Jannett Celestine RDCS (AE) Referring Phys: [id number] [facility]  Sonographer Comments: Suboptimal apical window, no parasternal window and echo performed with patient supine and on artificial respirator. Image acquisition challenging due to respiratory motion and Image acquisition challenging due to breast implants. IMPRESSIONS  1. Extremely difficult study and many windows are non-diagnostic. Overall, LV function is normal. The RV is severely dilated with moderately reduced function. Findings could be related to pulmonary embolism or pulmonary hypertension. Clinical correlation is recommended.  2. Left ventricular ejection fraction, by estimation, is 60 to 65%. The left ventricle has normal function. The left ventricle has no regional wall motion abnormalities. Left ventricular diastolic function could not be evaluated. There is the interventricular septum is flattened in systole, consistent with right ventricular pressure overload.  3. Right ventricular systolic function is moderately reduced. The right ventricular size is severely enlarged. Tricuspid regurgitation signal is inadequate for assessing PA pressure.  4. Right atrial size was mild to moderately dilated.  5.  The mitral valve is grossly normal. No evidence of mitral valve regurgitation. No evidence of mitral stenosis.  6. The aortic valve is grossly normal. Aortic valve regurgitation is not visualized. No aortic stenosis is present.  7. The inferior vena cava is normal in size with greater than 50% respiratory variability, suggesting right atrial pressure of 3 mmHg. FINDINGS  Left Ventricle: Left ventricular ejection fraction, by estimation, is 60 to 65%. The left ventricle has normal function. The left ventricle has no regional wall motion abnormalities. The left ventricular internal cavity size was small. There is no left ventricular hypertrophy. The interventricular septum is flattened in systole, consistent with right ventricular pressure overload. Left ventricular diastolic function could not be evaluated. Right Ventricle: The right ventricular size is severely enlarged. No increase in right ventricular wall thickness. Right ventricular systolic function is moderately reduced. Tricuspid regurgitation signal is inadequate for assessing PA pressure. Left Atrium: Left atrial size was normal in size. Right Atrium: Right atrial size was mild to moderately dilated. Pericardium: Trivial pericardial effusion is present. Presence of pericardial fat pad. Mitral Valve: The mitral valve is grossly normal. No evidence of mitral valve regurgitation. No evidence of mitral valve stenosis. Tricuspid Valve: The tricuspid valve is grossly normal. Tricuspid valve regurgitation is trivial. No evidence of tricuspid stenosis. Aortic Valve: The aortic valve is grossly normal. Aortic valve regurgitation is not  visualized. No aortic stenosis is present. Pulmonic Valve: The pulmonic valve was not well visualized. Pulmonic valve regurgitation is not visualized. Aorta: The aortic root is normal in size and structure. Venous: The inferior vena cava is normal in size with greater than 50% respiratory variability, suggesting right atrial pressure  of 3 mmHg. IAS/Shunts: The atrial septum is grossly normal.  RIGHT VENTRICLE TAPSE (M-mode): 1.4 cm RIGHT ATRIUM           Index RA Area:     11.90 cm RA Volume:   27.00 ml  15.69 ml/m  AORTIC VALVE LVOT Vmax:   84.20 cm/s LVOT Vmean:  54.200 cm/s LVOT VTI:    0.135 m MITRAL VALVE MV Area (PHT): 4.06 cm    SHUNTS MV Decel Time: 187 msec    Systemic VTI: 0.14 m MV E velocity: 68.40 cm/s Eleonore Chiquito MD Electronically signed by Eleonore Chiquito MD Signature Date/Time: 03/09/2020/4:34:06 PM    Final    VAS [name] LOWER EXTREMITY VENOUS (DVT)  Result Date: 03/09/2020  Lower Venous DVTStudy Indications: Edema.  Limitations: Body habitus, poor ultrasound/tissue interface, line, and intubated. Comparison Study: No prior study Performing Technologist: Maudry Mayhew MHA, RDMS, RVT, RDCS  Examination Guidelines: A complete evaluation includes B-mode imaging, spectral Doppler, color Doppler, and power Doppler as needed of all accessible portions of each vessel. Bilateral testing is considered an integral part of a complete examination. Limited examinations for reoccurring indications may be performed as noted. The reflux portion of the exam is performed with the patient in reverse Trendelenburg.  +---------+---------------+---------+-----------+----------+--------------+ RIGHT    CompressibilityPhasicitySpontaneityPropertiesThrombus Aging +---------+---------------+---------+-----------+----------+--------------+ FV Prox  Full                    Yes                                 +---------+---------------+---------+-----------+----------+--------------+ FV Mid   Full                    Yes                                 +---------+---------------+---------+-----------+----------+--------------+ FV DistalFull                    Yes                                 +---------+---------------+---------+-----------+----------+--------------+ POP      Full           Yes      Yes                                  +---------+---------------+---------+-----------+----------+--------------+ PTV      Full                                                        +---------+---------------+---------+-----------+----------+--------------+   Right Technical Findings: Not visualized segments include CFV, SFJ, PFV, peroneal veins.  +---------+---------------+---------+-----------+----------+--------------+ LEFT     CompressibilityPhasicitySpontaneityPropertiesThrombus Aging +---------+---------------+---------+-----------+----------+--------------+ CFV      Full  Yes      Yes                                 +---------+---------------+---------+-----------+----------+--------------+ SFJ      Full                                                        +---------+---------------+---------+-----------+----------+--------------+ FV Prox  Full                                                        +---------+---------------+---------+-----------+----------+--------------+ FV Mid   Full                                                        +---------+---------------+---------+-----------+----------+--------------+ FV DistalFull                                                        +---------+---------------+---------+-----------+----------+--------------+ POP      Full           Yes      Yes                                 +---------+---------------+---------+-----------+----------+--------------+ PTV      Full                                                        +---------+---------------+---------+-----------+----------+--------------+   Left Technical Findings: Not visualized segments include PFV, peroneal veins.   Summary: RIGHT: - There is no evidence of deep vein thrombosis in the lower extremity. However, portions of this examination were limited- see technologist comments above.  - No cystic structure found in the popliteal fossa.  LEFT: -  There is no evidence of deep vein thrombosis in the lower extremity. However, portions of this examination were limited- see technologist comments above.  - No cystic structure found in the popliteal fossa.  *See table(s) above for measurements and observations. Electronically signed by Deitra Mayo MD on 03/09/2020 at 2:35:31 PM.    Final    [name] EKG SITE RITE  Result Date: 03/09/2020 If Site Rite image not attached, placement could not be confirmed due to current cardiac rhythm.

## 2020-03-09 NOTE — Progress Notes (Signed)
Patient ID: Tracy [name], female   DOB: 27-Dec-1950, 69 y.o.   MRN: [id number]   Patient now intubated Ostomy has prolapsed.  I suspect it developed a hematoma.  It remains widely patent.  It will probably slough off and be ok.  No need for emergent revision. I discussed this with her husband at bedside

## 2020-03-09 NOTE — Progress Notes (Signed)
Message left for RN that PICC most likely cannot be placed until 03/10/20.  Currently intubating pt, blood pressures low per flowsheet, blood cultures drawn this am, will wait on placement of PICC line. Recommend CVC is central access needed.

## 2020-03-09 NOTE — Progress Notes (Signed)
Nutrition Follow-up  DOCUMENTATION CODES:   Not applicable  INTERVENTION:  Vital High Protein @ 40 ml/hr with 30 ml Prostat 3x/daily MVI daily via tube  This regimen will provide 1260 kcal (102% of estimated needs), 129 grams of protein, and 806 ml free water  Monitor magnesium, potassium, and phosphorus daily for at least 3 days, MD to replete as needed, as pt is at risk for refeeding syndrome given NPO/CL x 6 days  with 0% intake of CL diet x 3 documented meals.   NUTRITION DIAGNOSIS:   Inadequate protein intake related to other (see comment)(current diet order) as evidenced by other (comment)(CLD does not meet estimated protein need.).  Ongoing  GOAL:   Patient will meet greater than or equal to 90% of their needs  Progressing  MONITOR:   PO intake, Supplement acceptance, Diet advancement, Labs, Weight trends, Skin  REASON FOR ASSESSMENT:   Consult Enteral/tube feeding initiation and management  ASSESSMENT:  RD working remotely.  69 year-old female with medical history of asthma, COPD, hemorrhoids, osteopenia, vitamin D deficiency, and seasonal allergies. She presented to the ED on 3/20 due to abdominal pain with associated nausea and diarrhea. These symptoms have been ongoing for several months.  Patient admitted on 3/21 with bowel perforation and is s/p lower anterior resection and colostomy for proximal rectal cancer associated with distal sigmoid perforation.  3/22 AKI 3/26 CT abdomen showed possible liver lesion and postop ileus.  3/27 Intubated  Per chart, patient developed acute delirium and respiratory distress with desaturation overnight and given empiric IV heparin given concern of pulmonary embolism in the setting of cancer and being bedbound for 5 days. CCM consulted this morning for COPD,  found to be in severe respiratory distress, s/p failed BiPAP and intubated.  Per flowsheets patient was NPO from 3/21, advanced a clear liquid diet from 3/23-3/25  with 0% po intake x 3 documented meals. Given NPO/CL since admission with no po intake, she is at high risk for refeeding, monitor K/Mg/P, MD to replete as needed.  Patient is currently sedated and intubated on ventilator support MV: 6.7  L/min Temp (24hrs), Avg:97.7 F (36.5 C), Min:97.2 F (36.2 C), Max:98 F (36.7 C)  Propofol: none ml/hr Medications reviewed and include: SSI, methylprednisolone, protonix IVF: D5 NaCl @ 50 ml/hr Fentanyl Neo 10 mcg Zosyn Labs: BG 99-105 x 24 hrs  Generalized mild pitting edema noted per RN assessment Current wt 155.76 lbs  Diet Order:   Diet Order            Diet NPO time specified Except for: Sips with Meds  Diet effective now              EDUCATION NEEDS:   Not appropriate for education at this time  Skin:  Skin Assessment: Skin Integrity Issues: Skin Integrity Issues:: Incisions Incisions: abdomen (3/22)  Last BM:  3/27 type 7  Height:   Ht Readings from Last 1 Encounters:  03/09/20 5\' 2"  (1.575 m)    Weight:   Wt Readings from Last 1 Encounters:  03/09/20 70.8 kg    Ideal Body Weight:  50 kg  BMI:  Body mass index is 28.55 kg/m.  Estimated Nutritional Needs:   Kcal:  1232  Protein:  127-140  Fluid:  >/= 1.2 L/day   Lajuan Lines, RD, LDN Clinical Nutrition After Hours/Weekend Pager # in [phone]

## 2020-03-09 NOTE — Progress Notes (Signed)
  Echocardiogram 2D Echocardiogram has been performed.  Tracy [name] 03/09/2020, 3:07 PM

## 2020-03-10 ENCOUNTER — Inpatient Hospital Stay (HOSPITAL_COMMUNITY): Payer: Medicare HMO

## 2020-03-10 DIAGNOSIS — J9602 Acute respiratory failure with hypercapnia: Secondary | ICD-10-CM

## 2020-03-10 DIAGNOSIS — D649 Anemia, unspecified: Secondary | ICD-10-CM

## 2020-03-10 DIAGNOSIS — N179 Acute kidney failure, unspecified: Secondary | ICD-10-CM

## 2020-03-10 DIAGNOSIS — K631 Perforation of intestine (nontraumatic): Principal | ICD-10-CM

## 2020-03-10 LAB — CBC
HCT: 34 % — ABNORMAL LOW (ref 36.0–46.0)
Hemoglobin: 10.9 g/dL — ABNORMAL LOW (ref 12.0–15.0)
MCH: 31.6 pg (ref 26.0–34.0)
MCHC: 32.1 g/dL (ref 30.0–36.0)
MCV: 98.6 fL (ref 80.0–100.0)
Platelets: 399 10*3/uL (ref 150–400)
RBC: 3.45 MIL/uL — ABNORMAL LOW (ref 3.87–5.11)
RDW: 12.4 % (ref 11.5–15.5)
WBC: 36.6 10*3/uL — ABNORMAL HIGH (ref 4.0–10.5)
nRBC: 0.1 % (ref 0.0–0.2)

## 2020-03-10 LAB — GLUCOSE, CAPILLARY
Glucose-Capillary: 150 mg/dL — ABNORMAL HIGH (ref 70–99)
Glucose-Capillary: 162 mg/dL — ABNORMAL HIGH (ref 70–99)
Glucose-Capillary: 174 mg/dL — ABNORMAL HIGH (ref 70–99)
Glucose-Capillary: 189 mg/dL — ABNORMAL HIGH (ref 70–99)
Glucose-Capillary: 196 mg/dL — ABNORMAL HIGH (ref 70–99)
Glucose-Capillary: 213 mg/dL — ABNORMAL HIGH (ref 70–99)
Glucose-Capillary: 225 mg/dL — ABNORMAL HIGH (ref 70–99)
Glucose-Capillary: 231 mg/dL — ABNORMAL HIGH (ref 70–99)

## 2020-03-10 LAB — COMPREHENSIVE METABOLIC PANEL
ALT: 148 U/L — ABNORMAL HIGH (ref 0–44)
AST: 282 U/L — ABNORMAL HIGH (ref 15–41)
Albumin: 1.8 g/dL — ABNORMAL LOW (ref 3.5–5.0)
Alkaline Phosphatase: 63 U/L (ref 38–126)
Anion gap: 13 (ref 5–15)
BUN: 49 mg/dL — ABNORMAL HIGH (ref 8–23)
CO2: 23 mmol/L (ref 22–32)
Calcium: 7.6 mg/dL — ABNORMAL LOW (ref 8.9–10.3)
Chloride: 98 mmol/L (ref 98–111)
Creatinine, Ser: 2.22 mg/dL — ABNORMAL HIGH (ref 0.44–1.00)
GFR calc Af Amer: 26 mL/min — ABNORMAL LOW (ref >60)
GFR calc non Af Amer: 22 mL/min — ABNORMAL LOW (ref >60)
Glucose, Bld: 197 mg/dL — ABNORMAL HIGH (ref 70–99)
Potassium: 4.2 mmol/L (ref 3.5–5.1)
Sodium: 134 mmol/L — ABNORMAL LOW (ref 135–145)
Total Bilirubin: 0.8 mg/dL (ref 0.3–1.2)
Total Protein: 4.7 g/dL — ABNORMAL LOW (ref 6.5–8.1)

## 2020-03-10 LAB — LACTIC ACID, PLASMA
Lactic Acid, Venous: 2 mmol/L (ref 0.5–1.9)
Lactic Acid, Venous: 2 mmol/L (ref 0.5–1.9)

## 2020-03-10 LAB — URINE CULTURE: Culture: <10000 — AB

## 2020-03-10 LAB — HEPARIN LEVEL (UNFRACTIONATED): Heparin Unfractionated: 0.75 IU/mL — ABNORMAL HIGH (ref 0.30–0.70)

## 2020-03-10 LAB — PHOSPHORUS: Phosphorus: 4.3 mg/dL (ref 2.5–4.6)

## 2020-03-10 LAB — ABO/RH: ABO/RH(D): A POS

## 2020-03-10 LAB — MAGNESIUM: Magnesium: 2.3 mg/dL (ref 1.7–2.4)

## 2020-03-10 LAB — PROCALCITONIN: Procalcitonin: 27.18 ng/mL

## 2020-03-10 MED ORDER — HEPARIN BOLUS VIA INFUSION
2000.0000 [IU] | Freq: Once | INTRAVENOUS | Status: AC
  Administered 2020-03-10: 2000 [IU] via INTRAVENOUS
  Filled 2020-03-10: qty 2000

## 2020-03-10 MED ORDER — LACTATED RINGERS IV BOLUS
1000.0000 mL | Freq: Once | INTRAVENOUS | Status: AC
  Administered 2020-03-10: 1000 mL via INTRAVENOUS

## 2020-03-10 MED ORDER — ALBUMIN HUMAN 5 % IV SOLN: 12.5000 g | Freq: Once | INTRAVENOUS | Status: DC

## 2020-03-10 MED ORDER — METHYLPREDNISOLONE SODIUM SUCC 40 MG IJ SOLR
40.0000 mg | INTRAMUSCULAR | Status: DC
  Administered 2020-03-11 – 2020-03-12 (×2): 40 mg via INTRAVENOUS
  Filled 2020-03-10 (×2): qty 1

## 2020-03-10 MED ORDER — HEPARIN (PORCINE) 25000 UT/250ML-% IV SOLN
800.0000 [IU]/h | INTRAVENOUS | Status: DC
  Administered 2020-03-10 – 2020-03-11 (×2): 800 [IU]/h via INTRAVENOUS
  Filled 2020-03-10: qty 250

## 2020-03-10 MED ORDER — DEXMEDETOMIDINE HCL IN NACL 200 MCG/50ML IV SOLN: 0.4000 ug/kg/h | INTRAVENOUS | Status: DC

## 2020-03-10 MED ORDER — HEPARIN (PORCINE) 25000 UT/250ML-% IV SOLN
1000.0000 [IU]/h | INTRAVENOUS | Status: DC
  Administered 2020-03-10: 1000 [IU]/h via INTRAVENOUS

## 2020-03-10 MED ORDER — FUROSEMIDE 10 MG/ML IJ SOLN
40.0000 mg | Freq: Once | INTRAMUSCULAR | Status: AC
  Administered 2020-03-10: 40 mg via INTRAVENOUS
  Filled 2020-03-10: qty 4

## 2020-03-10 MED ORDER — ADULT MULTIVITAMIN LIQUID CH
15.0000 mL | Freq: Every day | ORAL | Status: DC
  Administered 2020-03-10 – 2020-03-12 (×3): 15 mL via ORAL
  Filled 2020-03-10 (×3): qty 15

## 2020-03-10 MED ORDER — FUROSEMIDE 10 MG/ML IJ SOLN: 40.0000 mg | Freq: Once | INTRAMUSCULAR | Status: DC

## 2020-03-10 NOTE — Progress Notes (Signed)
ANTICOAGULATION CONSULT NOTE - Initial Consult  Pharmacy Consult for IV heparin Indication: PE  Allergies  Allergen Reactions  . Codeine Swelling    Patient Measurements: Height: 5\' 2"  (157.5 cm) Weight: 160 lb 7.9 oz (72.8 kg) IBW/kg (Calculated) : 50.1 Heparin Dosing Weight: 66 kg  Vital Signs: Temp: 99 F (37.2 C) (03/28 0700) BP: 123/90 (03/28 0700) Pulse Rate: 107 (03/28 0700)  Labs: Recent Labs    03/09/20 0517 03/09/20 [id number] 03/09/20 0654 03/09/20 0907 03/09/20 1307 03/09/20 1307 03/09/20 1752 03/10/20 0549  HGB   < >  --   --   --  12.5   < > 11.5* 10.9*  HCT   < >  --   --   --  39.3  --  36.3 34.0*  PLT   < >  --   --   --  336  --  385 399  LABPROT  --   --   --   --   --   --  16.2*  --   INR  --   --   --   --   --   --  1.3*  --   HEPARINUNFRC  --  0.48  --   --   --   --   --   --   CREATININE   < >  --   --   --  1.60*  --  1.74* 2.22*  TROPONINIHS  --   --    < > 4 5  --  4  --    < > = values in this interval not displayed.    Estimated Creatinine Clearance: 23.3 mL/min (A) (by C-G formula based on SCr of 2.22 mg/dL (H)).   Medical History: Past Medical History:  Diagnosis Date  . Asthma   . COPD (chronic obstructive pulmonary disease) ([facility]) 2011   FeV1 31% predicted FeV1/FVX 47 %  . Hemorrhoid   . Osteopenia   . Seasonal allergies    takes Claritin daily prn  . Vitamin D deficiency    takes Vit d every 14 days    Medications: Pt not on anticoagulants PTA  Assessment: 69 yo female s/p emergent low anterior resection/colostomy on 3/21 due to perforated sigmoid colon. Pt transferred to ICU on 3/26 with SOB/tachypnea and heparin drip was empirically started for r/o PE. Pt received contrast on 3/21 and again on 3/26 for CT abd/pelvis and was unable to receive more contrast at that time. IV UFH was transitioned back to LMWH for DVT ppx on 3/27.   Patient currently intubated, pharmacy consulted on 3/28 to restart IV UFH for pulmonary  embolism. Progress notes from 3/27 documenting blood in colostomy, possible hematoma. Confirmed with surgery that it is ok to initiate heparin with bolus for PE at this time.  Today, 03/10/20  CBC: Hgb low and trending down; Plt WNL  SCr 2.22, CrCl ~23 mL/min. Worsening renal function  Goal of Therapy:  Heparin level 0.3-0.7 units/ml Monitor platelets by anticoagulation protocol: Yes   Plan:   Heparin bolus of 2000 units x1  Heparin infusion at 1000 units/hr; pt was previously therapeutic on this rate  Check HL in 8 hours  CBC, HL daily while on heparin drip  Monitor for signs/symptoms of bleeding  Lenis Noon, PharmD 03/10/2020,8:23 AM

## 2020-03-10 NOTE — Progress Notes (Signed)
NAME:  Tracy [name], MRN:  [id number], DOB:  Aug 23, 1951, LOS: 7 ADMISSION DATE:  03/03/2020, CONSULTATION DATE:  3/27/021 REFERRING MD:  - Dr Ninfa Linden of [facility], CHIEF COMPLAINT:  Post op copd consult   Brief History   Rectal cancer s/p colon perf, repaired 3/21 Septic shock- HAP RUL, intubated 3/27 COPD- GOLD B  History of present illness   69 year old former smoker with gold stage III-4 COPD FEV1 31% with postnasal drip.  Sees Dr. Leory Plowman Icard in the pulmonary office.  At baseline she is on Trelegy, Flonase and Claritin.  She is not on oxygen at baseline but husband reports class III dyspnea even for ADLs.  She presented on March 03, 2020 with bowel perforation and status post lower anterior resection and colostomy for newly diagnosed proximal rectal cancer associated with distal sigmoid perforation by Dr. Doreene Adas.  There was no obvious metastatic disease on the parietal peritoneum or liver.   Her postoperative course has been complicated by acute kidney injury that was improving from 2.2 mg percent on 03/06/2019 1 to 1.2 mg percent on 03/08/2020 [status post Foley removal 03/06/2020].  During rounds on the morning of 03/08/2020: White blood count is also trending down.  The stoma was felt to be improving she was getting intermittent opioids for pain and also symptomatic nausea treatment.  She also underwent CT abdomen with contrast that showed possible liver lesion along with postop ileus  On the night of March 08, 2020 she developed acute delirium and respiratory distress with desaturation.  Empiric IV heparin was started given concern for pulmonary embolism in the setting of cancer and being bedbound for 5 days [although she was getting Lovenox DVT prophylaxis even as of 03/04/20]. CCM consulted am of 03/09/20 for copd. PAtient found to be in severe resp distress with bilateral faint wheezing and not responding to ccm interventions of fentanyl, nebulizer, bipap, mag and solumedrol  BP low  and fluids responsive  Past Medical History  COPD 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 Allergies Vitamin D deficiency Newly diagnosed colon cancer   Past Surgical History:  Procedure Laterality Date  . AUGMENTATION MAMMAPLASTY     saline  . BREAST ENHANCEMENT SURGERY    . EXAMINATION UNDER ANESTHESIA  10/14/2012   Procedure: EXAM UNDER ANESTHESIA;  Surgeon: Gayland Curry, MD,FACS;  Location: [facility];  Service: General;  Laterality: N/A;  rectal exam under anesthesia excisional hemorrhoidectomy hemorrhoidal banding x two  . EXAMINATION UNDER ANESTHESIA  02/03/2013   excision hemorrhoidal tissue  . FOOT SURGERY     left bunionectomy  . HEMORRHOIDECTOMY WITH HEMORRHOID BANDING  10/14/2012   Procedure: [id number] WITH HEMORRHOID BANDING;  Surgeon: Gayland Curry, MD,FACS;  Location: [facility];  Service: General;  Laterality: N/A;  . LAPAROTOMY N/A 03/03/2020   Procedure: low anterior resection end colostomy;  Surgeon: [name], MD;  Location: [facility];  Service: General;  Laterality: N/A;  . OPEN REDUCTION INTERNAL FIXATION (ORIF) DISTAL RADIAL FRACTURE Right 11/01/2018   Procedure: OPEN REDUCTION INTERNAL FIXATION (ORIF) DISTAL RADIAL FRACTURE;  Surgeon: [name], MD;  Location: [facility];  Service: Orthopedics;  Laterality: Right;  . TONSILLECTOMY  age 69    recurrent otitis media    Allergies  Allergen Reactions  . Codeine Swelling    Immunization History  Administered Date(s) Administered  . Fluad Quad(high Dose 65+) 10/27/2019  . Influenza Split 01/06/2013, 09/13/2013  . Influenza, High Dose Seasonal PF 10/10/2018  . Influenza,inj,Quad PF,6+ Mos 09/13/2014,  10/15/2015  . Influenza,inj,Quad PF,6-35 Mos 10/26/2016  . Pneumococcal Conjugate-13 07/12/2014  . Pneumococcal Polysaccharide-23 01/06/2013, 10/10/2018  . Tdap 05/15/2013  . Zoster 07/14/2013    Family History  Problem Relation Age of Onset  . Osteoporosis Mother   . Other Father         [place] in snow, broke ankle, blood clot to lungs  . Heart murmur Sister   . Other Brother        h/o being hit by lightening  . Parkinsonism Brother   . Asthma Son   . Allergies Son   . Otitis media Son   . Other Sister        CML  . Asthma Son   . Allergies Son   . Pancreatic cancer Brother        diag 2013-12-13-deceased Feb 12, 2014     Current Facility-Administered Medications:  .  0.9 %  sodium chloride infusion, 250 mL, Intravenous, PRN, Rayburn, Kelly A, PA-C .  albuterol (PROVENTIL) (2.5 MG/3ML) 0.083% nebulizer solution 2.5 mg, 2.5 mg, Nebulization, Q6H PRN, Rayburn, Kelly A, PA-C, 2.5 mg at 03/09/20 0826 .  chlorhexidine gluconate (MEDLINE KIT) (PERIDEX) 0.12 % solution 15 mL, 15 mL, Mouth Rinse, BID, Ramaswamy, Murali, MD, 15 mL at 03/09/20 2023 .  Chlorhexidine Gluconate Cloth 2 % PADS 6 each, 6 each, Topical, Daily, Rayburn, Kelly A, PA-C, 6 each at 03/09/20 0906 .  dextrose 5 %-0.45 % sodium chloride infusion, , Intravenous, Continuous, Ramaswamy, Murali, MD, Last Rate: 50 mL/hr at 03/10/20 0714, New Bag at 03/10/20 0714 .  enoxaparin (LOVENOX) injection 30 mg, 30 mg, Subcutaneous, Daily, Ramaswamy, Murali, MD .  feeding supplement (PRO-STAT SUGAR FREE 64) liquid 30 mL, 30 mL, Oral, TID BM, Rayburn, Kelly A, PA-C, 30 mL at 03/09/20 2023 .  feeding supplement (VITAL HIGH PROTEIN) liquid 1,000 mL, 1,000 mL, Per Tube, Q24H, Ramaswamy, Murali, MD, 1,000 mL at 03/09/20 1510 .  fentaNYL (SUBLIMAZE) bolus via infusion 25 mcg, 25 mcg, Intravenous, Q15 min PRN, Brand Males, MD, 25 mcg at 03/09/20 1209 .  fentaNYL (SUBLIMAZE) injection 25 mcg, 25 mcg, Intravenous, Q15 min PRN, Chase Caller, Murali, MD .  fentaNYL (SUBLIMAZE) injection 25-100 mcg, 25-100 mcg, Intravenous, Q30 min PRN, Brand Males, MD, 50 mcg at 03/09/20 2336 .  fentaNYL 2538mg in NS 2540m(1079mml) infusion-PREMIX, 25-200 mcg/hr, Intravenous, Continuous, Ramaswamy, Murali, MD, Last Rate: 10 mL/hr at 03/09/20 1813,  100 mcg/hr at 03/09/20 1813 .  insulin aspart (novoLOG) injection 2-6 Units, 2-6 Units, Subcutaneous, Q4H, RamBrand MalesD, 6 Units at 03/10/20 0405 .  ipratropium-albuterol (DUONEB) 0.5-2.5 (3) MG/3ML nebulizer solution 3 mL, 3 mL, Nebulization, Q4H, Ramaswamy, Murali, MD, 3 mL at 03/10/20 0404 .  MEDLINE mouth rinse, 15 mL, Mouth Rinse, 10 times per day, RamBrand MalesD, 15 mL at 03/10/20 0405 .  methylPREDNISolone sodium succinate (SOLU-MEDROL) 125 mg/2 mL injection 60 mg, 60 mg, Intravenous, Q6H, Ramaswamy, Murali, MD, 60 mg at 03/10/20 0403 .  midazolam (VERSED) injection 2 mg, 2 mg, Intravenous, Q15 min PRN, RamBrand MalesD, 2 mg at 03/09/20 1217 .  midazolam (VERSED) injection 2 mg, 2 mg, Intravenous, Q2H PRN, RamBrand MalesD, 2 mg at 03/10/20 0428 .  multivitamin with minerals tablet 1 tablet, 1 tablet, Per Tube, Daily, Ramaswamy, Murali, MD .  norepinephrine (LEVOPHED) 4mg19m 250mL2mmix infusion, 0-40 mcg/min, Intravenous, Titrated, Ramaswamy, Murali, MD, Last Rate: 15 mL/hr at 03/09/20 2031, 4 mcg/min at 03/09/20 2031 .  pantoprazole (PROTONIX) injection 40 mg, 40 mg,  Intravenous, Daily, Brand Males, MD, 40 mg at 03/09/20 1216 .  phenol (CHLORASEPTIC) mouth spray 1 spray, 1 spray, Mouth/Throat, PRN, Rayburn, Kelly A, PA-C .  piperacillin-tazobactam (ZOSYN) IVPB 3.375 g, 3.375 g, Intravenous, Q8H, Rayburn, Kelly A, PA-C, Last Rate: 12.5 mL/hr at 03/10/20 0404, 3.375 g at 03/10/20 0404 .  saccharomyces boulardii (FLORASTOR) capsule 250 mg, 250 mg, Oral, BID, Rayburn, Kelly A, PA-C, 250 mg at 03/09/20 2229 .  sodium chloride flush (NS) 0.9 % injection 3 mL, 3 mL, Intravenous, Q12H, Rayburn, Kelly A, PA-C, 3 mL at 03/09/20 2236 .  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  RV volume and pressure overload.  Mildly dilated RA.  Micro Data:  3/27 blood>>   Antimicrobials:   Zosyn 3/21 >>     Interim history/subjective:  On 1 vasopressor overnight.  Lactate slowly improving overnight.   Objective   Blood pressure 124/70, pulse (!) 106, temperature 99 F (37.2 C), resp. rate 13, height 5' 2"  (1.575 m), weight 72.8 kg, last menstrual period 12/14/2000, SpO2 96 %. CVP:  [6 mmHg-25 mmHg] 8 mmHg  Vent Mode: PRVC FiO2 (%):  [55 %-100 %] 55 % Set Rate:  [18 bmp] 18 bmp Vt Set:  [400 mL] 400 mL PEEP:  [5 cmH20] 5 cmH20 Plateau Pressure:  [12 cmH20-19 cmH20] 12 cmH20   Intake/Output Summary (Last 24 hours) at 03/10/2020 0718 Last data filed at 03/10/2020 0500 Gross per 24 hour  Intake 2288.11 ml  Output 1350 ml  Net 938.11 ml   Filed Weights   03/08/20 2321 03/09/20 0416 03/10/20 0500  Weight: 72.3 kg 70.8 kg 72.8 kg    Examination: General: Critically ill-appearing frail elderly woman lying in bed intubated, sedated HENT: [place]/AT, eyes anicteric Lungs: CTAB, breathing comfortably on the vent.  Barrel chest.  Mild obstruction on the vent.  No secretions from ET tube. Cardiovascular: Minimally tachycardic, regular rhythm, no murmurs Abdomen: Soft, nontender, nondistended.  Left-sided ostomy without bleeding, no surrounding erythema. Extremities: Pitting edema in all extremities Neuro: RASS -4, tracks when her eyes are open GU: Foley in  place with minimal urine output  CXR 3/28 personally reviewed -CBC and ETT in appropriate position.  RUL opacity not significantly changed compared to previous images this admission  Resolved Hospital Problem list     Assessment & Plan:  ASSESSMENT / PLAN:  PULMONARY  A:  Baseline Gold stage III/IV COPD but not on home oxygen. Unclear etiology of her deterioration, but with echocardiogram demonstrating RV strain and no previous for comparison, there is concern for acute PE despite negative lower extremity Dopplers.  An aspiration event is also possible. HAP and COPD exacerbation possible.  -Continue low tidal volume ventilation, 4 to 8 cc/kg ideal body weight with goal plateau's and 30 driving pressure less than 15.  At goal. -Titrate PEEP and  FiO2 per ARDS ladder -Fentanyl for sedation; goal RASS 0 to -1 -Restart heparin infusion for presumed PE -VAP prevention protocol -Daily SAT and SBT when able.  Currently requiring too much oxygen for extubation. -Consider VQ scan -If PE present, not a candidate for thrombolytics given recent significant abdominal surgery -Decrease steroids to once daily dosing -Nebulizers every 4 hours   Acute delirium 03/08/2020 late p.m. the setting of opioids and COPD and postop -Sedation is required to tolerate mechanical ventilation.  Goal RASS 0 to -1   Shock-obstructive vs septic vs due to sedation -Vasopressors as required to maintain MAP greater than 75   Newly diagnosed colon cancer with perforation causing peritonitis, status post resection on 3/21 -Continue Zosyn -Continue to follow cultures   Acute kidney injury on 03/04/2020.  Improved and near normalized on 03/08/2020 and and status post CT abdomen with dye load and shock.  Now recurrent AKI -Continue to monitor -Renally dose meds -Avoid nephrotoxic meds -Strict I/Os; goal of euvolemia  F/E/N: -Continue tube feeds and supplemental vitamins -D/C maintenance fluids -Continue to  monitor   Acute anemia-likely due to dilution, blood loss, critical illness -Transfuse for hemoglobin less than 7 or hemodynamically significant bleeding -Continue to monitor   Hyperglycemia; no previous known history of diabetes -A1c -Sliding scale insulin as needed -Continue Accu-Cheks every 4 hours -With decreaing steroids, not starting long-acting insulin today.  Will consider tomorrow.  Best practice:  Diet: TF Pain/Anxiety/Delirium protocol (if indicated): PAD protocol VAP protocol (if indicated): HOB > 30 DVT prophylaxis: heparin GI prophylaxis: ppi Glucose control: ssi Mobility: bed rest Code Status: full code Family Communication: husband updated at bedside Disposition: ICU  LABS    PULMONARY Recent Labs  Lab 03/08/20 2240 03/09/20 0545 03/09/20 1005  PHART 7.380 7.305* 7.330*  PCO2ART 42.0 47.1 46.1  PO2ART 67.6* 63.3* 260*  HCO3 24.4 23.0 23.9  O2SAT 92.2 90.2 99.3    CBC Recent Labs  Lab 03/09/20 1307 03/09/20 1752 03/10/20 0549  HGB 12.5 11.5* 10.9*  HCT 39.3 36.3 34.0*  WBC 26.8* 27.2* 36.6*  PLT 336 385 399    COAGULATION Recent Labs  Lab 03/09/20 1752  INR 1.3*    CARDIAC  No results for input(s): TROPONINI in the last 168 hours. No results for input(s): PROBNP in the last 168 hours.   CHEMISTRY Recent Labs  Lab 03/05/20 1446 03/06/20 0236 03/08/20 0252 03/08/20 0252 03/09/20 0517 03/09/20 0517 03/09/20 1307 03/09/20 1307 03/09/20 1752 03/10/20 0549  NA 136   < > 135  --  136  --  134*  --  136 134*  K 3.8   < > 3.6   < > 4.9   < > 4.3   < > 4.6 4.2  CL 102   < > 94*  --  100  --  99  --  100 98  CO2 21*   < > 30  --  20*  --  23  --  22 23  GLUCOSE 241*   < > 105*  --  99  --  144*  --  195* 197*  BUN 45*   < > 34*  --  35*  --  38*  --  42* 49*  CREATININE 2.29*   < > 1.12*  --  1.33*  --  1.60*  --  1.74* 2.22*  CALCIUM 7.9*   < > 8.3*  --  8.0*  --  7.3*  --  7.2* 7.6*  MG  --   --   --   --   --   --   --   --    --  2.3  PHOS 4.7*  --   --   --   --   --   --   --   --  4.3   < > = values in this interval not displayed.   Estimated Creatinine Clearance: 23.3 mL/min (A) (by C-G formula based on SCr of 2.22 mg/dL (H)).   LIVER Recent Labs  Lab 03/03/20 1214 03/05/20 1446 03/09/20 1307 03/09/20 1752 03/10/20 0549  AST 23  --  429* 440* 282*  ALT 13  --  131* 152* 148*  ALKPHOS 67  --  61 57 63  BILITOT 1.4*  --  1.3* 1.0 0.8  PROT 7.1  --  4.4* 4.2* 4.7*  ALBUMIN 3.9 2.4* 1.6* 1.8* 1.8*  INR  --   --   --  1.3*  --      INFECTIOUS Recent Labs  Lab 03/09/20 1307 03/09/20 1307 03/09/20 1749 03/09/20 2343 03/10/20 0549  LATICACIDVEN 2.3*   < > 2.4* 2.0* 2.0*  PROCALCITON 34.99  --   --   --  27.18   < > = values in this interval not displayed.     ENDOCRINE CBG (last 3)  Recent Labs    03/09/20 2347 03/10/20 0035 03/10/20 0405  GLUCAP 146* 225* 213*     This patient is critically ill with multiple organ system failure which requires frequent high complexity decision making, assessment, support, evaluation, and titration of therapies. This was completed through the application of advanced monitoring technologies and extensive interpretation of multiple databases. During this encounter critical care time was devoted to patient care services described in this note for 70 minutes.  Julian Hy, DO 03/10/20 7:55 AM [facility]

## 2020-03-10 NOTE — Progress Notes (Signed)
[facility] Progress Note Patient Name: Tracy [name] DOB: 05/10/1951 MRN: [id number]   Date of Service  03/10/2020  HPI/Events of Note  CVP 14, BP 106/75 , MAP 84  eICU Interventions  Albumin order discontinued.        [name] 03/10/2020, 2:01 AM

## 2020-03-10 NOTE — Progress Notes (Signed)
[facility] Progress Note Patient Name: Tracy [name] DOB: 1951-10-12 MRN: [id number]   Date of Service  03/10/2020  HPI/Events of Note  BUN 42, Creatinine 1.7, Albumin 1.8, Lactate 2.4  eICU Interventions  Albumin 250 ml iv bolus x 1        [name] 03/10/2020, 12:08 AM

## 2020-03-10 NOTE — Progress Notes (Signed)
7 Days Post-Op   Subjective/Chief Complaint: Remains intubated.   Objective: Vital signs in last 24 hours: Temp:  [97.2 F (36.2 C)-99 F (37.2 C)] 99 F (37.2 C) (03/28 0700) Pulse Rate:  [46-127] 107 (03/28 0700) Resp:  [11-35] 15 (03/28 0700) BP: (79-151)/(23-106) 123/90 (03/28 0700) SpO2:  [85 %-100 %] 93 % (03/28 0700) FiO2 (%):  [55 %-100 %] 55 % (03/28 0410) Weight:  [72.8 kg] 72.8 kg (03/28 0500) Last BM Date: 03/09/20  Intake/Output from previous day: 03/27 0701 - 03/28 0700 In: 2288.1 [I.V.:1109.1; NG/GT:94; IV Piggyback:1085] Out: 1350 [Urine:350; Emesis/NG output:30; Drains:495; Stool:475] Intake/Output this shift: No intake/output data recorded.  Exam: Intubated and sedated Abdomen is soft without frank peritoneal signs.  Ostomy is necrotic and prolapsed but patent and productive RLQ drain is serous  Lab Results:  Recent Labs    03/09/20 1752 03/10/20 0549  WBC 27.2* 36.6*  HGB 11.5* 10.9*  HCT 36.3 34.0*  PLT 385 399   BMET Recent Labs    03/09/20 1752 03/10/20 0549  NA 136 134*  K 4.6 4.2  CL 100 98  CO2 22 23  GLUCOSE 195* 197*  BUN 42* 49*  CREATININE 1.74* 2.22*  CALCIUM 7.2* 7.6*   PT/INR Recent Labs    03/09/20 1752  LABPROT 16.2*  INR 1.3*   ABG Recent Labs    03/09/20 0545 03/09/20 1005  PHART 7.305* 7.330*  HCO3 23.0 23.9    Studies/Results: DG Abd 1 View  Result Date: 03/09/2020 CLINICAL DATA:  OG tube placement EXAM: ABDOMEN - 1 VIEW COMPARISON:  None. FINDINGS: A feeding tube is identified with distal tip in the proximal stomach. Air is noted in the colon. IMPRESSION: Feeding tube identified with distal tip in the proximal stomach. Electronically Signed   By: Abelardo Diesel M.D.   On: 03/09/2020 12:34   CT ABDOMEN PELVIS W CONTRAST  Result Date: 03/08/2020 CLINICAL DATA:  Hyperkalemia, metabolic acidosis, history of perforated sigmoid colon and proximal rectal cancer with obstruction status post surgery, elevated  white blood cell count EXAM: CT ABDOMEN AND PELVIS WITH CONTRAST TECHNIQUE: Multidetector CT imaging of the abdomen and pelvis was performed using the standard protocol following bolus administration of intravenous contrast. CONTRAST:  132mL OMNIPAQUE IOHEXOL 300 MG/ML  SOLN COMPARISON:  03/03/2020 FINDINGS: Lower chest: Diffuse emphysema is noted. Trace bilateral pleural effusions. The heart is unremarkable. Hepatobiliary: Indeterminate 1.4 cm subcapsular hypodensity left lobe liver segment 4A. In light of the newly diagnosed rectal cancer, metastatic disease cannot be excluded. Nuclear medicine PET scan may be useful. Nodular contour of the liver capsule unchanged consistent with cirrhosis. Gallbladder is unremarkable. No biliary dilation. Pancreas: Unremarkable. No pancreatic ductal dilatation or surrounding inflammatory changes. Spleen: Normal in size without focal abnormality. Adrenals/Urinary Tract: Scattered areas of renal cortical scarring are seen, left greater than right, stable. No obstructive uropathy. The bladder is unremarkable. Adrenals are normal. Stomach/Bowel: There is been interval distal colon resection with a diverting colostomy seen within the left mid abdomen. Moderate residual stool within the colon. No evidence of bowel obstruction. Dilated loops of small bowel with scattered small bowel gas fluid levels may reflect postoperative ileus. Vascular/Lymphatic: Minimal atherosclerosis of the aorta. No pathologic adenopathy within the abdomen or pelvis. Reproductive: Status post hysterectomy. No adnexal masses. Other: There is diffuse subcutaneous edema which has developed in the interim. Trace free fluid within the abdomen and pelvis. I do not see any fluid collection or abscess at this time. Small amount of gas  is seen within the lower pelvis adjacent to an indwelling surgical drain. Postsurgical changes are seen from midline laparotomy with surgical packing in place. Musculoskeletal: No acute  or destructive bony lesions. Reconstructed images demonstrate no additional findings. IMPRESSION: 1. Interval midline laparotomy with distal colon resection and diverting left lower quadrant colostomy. 2. Diffuse small bowel dilatation with gas fluid levels most consistent with postoperative ileus. 3. Trace ascites within the abdomen and pelvis. No fluid collection or abscess at this time. Surgical drain within the lower pelvis. 4. Indeterminate 1.4 cm subcapsular liver hypodensity. In light of newly diagnosed rectal cancer, metastatic disease cannot be excluded. PET CT may be useful for further evaluation. 5. Interval development of trace bilateral pleural effusions and diffuse body wall edema. Electronically Signed   By: Randa Ngo M.D.   On: 03/08/2020 11:32   DG CHEST PORT 1 VIEW  Result Date: 03/10/2020 CLINICAL DATA:  Shortness of breath EXAM: PORTABLE CHEST 1 VIEW COMPARISON:  03/09/2020 FINDINGS: Patient is rotated. Endotracheal tube with the tip 4.5 cm above the carina. Nasogastric tube with the tip projecting over the stomach. Left jugular central venous catheter with the tip projecting over the SVC. Linear hyperdensity projecting over the right hilum measuring 4.6 cm in length of uncertain etiology. Persistent patchy right upper lobe airspace disease. No other areas of focal consolidation. Hyperinflated lungs as can be seen with COPD. No pleural effusion or pneumothorax. Stable cardiomediastinal silhouette. No aggressive osseous lesion. IMPRESSION: Support lines and tubing in satisfactory position. Persistent patchy right upper lobe airspace disease consistent with pneumonia. Linear hyperdensity projecting over the right hilum measuring 4.6 cm in length of uncertain etiology. This may reflect a foreign body superficial to the patient versus artifact. Recommend correlation with physical exam. This is unlikely to reflect a retained wire given that the appearance was present on x-ray dated 03/07/2020  prior to placement of a central venous catheter. Electronically Signed   By: Kathreen Devoid   On: 03/10/2020 05:54   DG CHEST PORT 1 VIEW  Result Date: 03/09/2020 CLINICAL DATA:  Endotracheal tube and central line placement EXAM: PORTABLE CHEST 1 VIEW COMPARISON:  March 08, 2020 FINDINGS: In tracheal tube is identified distal tip 4.5 cm from carina. Nasogastric tube is identified distal tip not included on the film but is at least in the stomach. Left central venous line is identified with distal tip in the superior vena cava. Emphysematous changes of bilateral lungs are identified. Small left pleural effusion is noted. Mild patchy opacity is identified the medial bilateral upper lobes and lateral left upper lobe. Bony structures are stable. IMPRESSION: 1. Endotracheal tube distal tip 4.5 cm from carina. 2. Left central venous line is identified with distal tip in the superior vena cava. No pneumothorax. 3. Opacities of bilateral lungs, developing pneumonia is not excluded. Electronically Signed   By: Abelardo Diesel M.D.   On: 03/09/2020 12:32   DG CHEST PORT 1 VIEW  Result Date: 03/08/2020 CLINICAL DATA:  Shortness of breath. EXAM: PORTABLE CHEST 1 VIEW COMPARISON:  March 08, 2020 FINDINGS: Moderate severe emphysematous changes are noted bilaterally. There are small bilateral pleural effusions with elevation of the left hemidiaphragm. There is no focal infiltrate or pneumothorax. The heart size is normal. Aortic calcifications are noted. There is no acute osseous abnormality. IMPRESSION: 1. Moderate severe emphysematous changes. 2. Small bilateral pleural effusions. 3. No significant short interval change. Electronically Signed   By: Constance Holster M.D.   On: 03/08/2020 22:11  DG CHEST PORT 1 VIEW  Result Date: 03/08/2020 CLINICAL DATA:  Pain with nausea EXAM: PORTABLE CHEST 1 VIEW COMPARISON:  March 07, 2020. FINDINGS: There is stable scarring in the apices, more on the right than the left. Lungs  elsewhere clear. Heart size and pulmonary vascularity are normal. No adenopathy. No bone lesions. IMPRESSION: Stable apical region scarring, more on the right than on the left. Lungs elsewhere clear. Cardiac silhouette within normal limits and stable. Electronically Signed   By: Lowella Grip III M.D.   On: 03/08/2020 13:27   ECHOCARDIOGRAM COMPLETE  Result Date: 03/09/2020    ECHOCARDIOGRAM REPORT   Patient Name:   Tracy [name] Date of Exam: 03/09/2020 Medical Rec #:  [id number]        Height:       62.0 in Accession #:    [id number]       Weight:       156.1 lb Date of Birth:  July 24, 1951        BSA:          1.721 m Patient Age:    69 years         BP:           107/53 mmHg Patient Gender: F                HR:           92 bpm. Exam Location:  Inpatient Procedure: 2D Echo Indications:    518.82 acute respiratory insufficiency  History:        Patient has no prior history of Echocardiogram examinations.                 COPD.  Sonographer:    Jannett Celestine RDCS (AE) Referring Phys: [id number] [facility] ([place])  Sonographer Comments: Suboptimal apical window, no parasternal window and echo performed with patient supine and on artificial respirator. Image acquisition challenging due to respiratory motion and Image acquisition challenging due to breast implants. IMPRESSIONS  1. Extremely difficult study and many windows are non-diagnostic. Overall, LV function is normal. The RV is severely dilated with moderately reduced function. Findings could be related to pulmonary embolism or pulmonary hypertension. Clinical correlation is recommended.  2. Left ventricular ejection fraction, by estimation, is 60 to 65%. The left ventricle has normal function. The left ventricle has no regional wall motion abnormalities. Left ventricular diastolic function could not be evaluated. There is the interventricular septum is flattened in systole, consistent with right ventricular pressure overload.  3. Right ventricular systolic  function is moderately reduced. The right ventricular size is severely enlarged. Tricuspid regurgitation signal is inadequate for assessing PA pressure.  4. Right atrial size was mild to moderately dilated.  5. The mitral valve is grossly normal. No evidence of mitral valve regurgitation. No evidence of mitral stenosis.  6. The aortic valve is grossly normal. Aortic valve regurgitation is not visualized. No aortic stenosis is present.  7. The inferior vena cava is normal in size with greater than 50% respiratory variability, suggesting right atrial pressure of 3 mmHg. FINDINGS  Left Ventricle: Left ventricular ejection fraction, by estimation, is 60 to 65%. The left ventricle has normal function. The left ventricle has no regional wall motion abnormalities. The left ventricular internal cavity size was small. There is no left ventricular hypertrophy. The interventricular septum is flattened in systole, consistent with right ventricular pressure overload. Left ventricular diastolic function could not be evaluated. Right Ventricle: The right ventricular size is severely  enlarged. No increase in right ventricular wall thickness. Right ventricular systolic function is moderately reduced. Tricuspid regurgitation signal is inadequate for assessing PA pressure. Left Atrium: Left atrial size was normal in size. Right Atrium: Right atrial size was mild to moderately dilated. Pericardium: Trivial pericardial effusion is present. Presence of pericardial fat pad. Mitral Valve: The mitral valve is grossly normal. No evidence of mitral valve regurgitation. No evidence of mitral valve stenosis. Tricuspid Valve: The tricuspid valve is grossly normal. Tricuspid valve regurgitation is trivial. No evidence of tricuspid stenosis. Aortic Valve: The aortic valve is grossly normal. Aortic valve regurgitation is not visualized. No aortic stenosis is present. Pulmonic Valve: The pulmonic valve was not well visualized. Pulmonic valve  regurgitation is not visualized. Aorta: The aortic root is normal in size and structure. Venous: The inferior vena cava is normal in size with greater than 50% respiratory variability, suggesting right atrial pressure of 3 mmHg. IAS/Shunts: The atrial septum is grossly normal.  RIGHT VENTRICLE TAPSE (M-mode): 1.4 cm RIGHT ATRIUM           Index RA Area:     11.90 cm RA Volume:   27.00 ml  15.69 ml/m  AORTIC VALVE LVOT Vmax:   84.20 cm/s LVOT Vmean:  54.200 cm/s LVOT VTI:    0.135 m MITRAL VALVE MV Area (PHT): 4.06 cm    SHUNTS MV Decel Time: 187 msec    Systemic VTI: 0.14 m MV E velocity: 68.40 cm/s Eleonore Chiquito MD Electronically signed by Eleonore Chiquito MD Signature Date/Time: 03/09/2020/4:34:06 PM    Final    VAS [name] LOWER EXTREMITY VENOUS (DVT)  Result Date: 03/09/2020  Lower Venous DVTStudy Indications: Edema.  Limitations: Body habitus, poor ultrasound/tissue interface, line, and intubated. Comparison Study: No prior study Performing Technologist: Maudry Mayhew MHA, RDMS, RVT, RDCS  Examination Guidelines: A complete evaluation includes B-mode imaging, spectral Doppler, color Doppler, and power Doppler as needed of all accessible portions of each vessel. Bilateral testing is considered an integral part of a complete examination. Limited examinations for reoccurring indications may be performed as noted. The reflux portion of the exam is performed with the patient in reverse Trendelenburg.  +---------+---------------+---------+-----------+----------+--------------+ RIGHT    CompressibilityPhasicitySpontaneityPropertiesThrombus Aging +---------+---------------+---------+-----------+----------+--------------+ FV Prox  Full                    Yes                                 +---------+---------------+---------+-----------+----------+--------------+ FV Mid   Full                    Yes                                  +---------+---------------+---------+-----------+----------+--------------+ FV DistalFull                    Yes                                 +---------+---------------+---------+-----------+----------+--------------+ POP      Full           Yes      Yes                                 +---------+---------------+---------+-----------+----------+--------------+  PTV      Full                                                        +---------+---------------+---------+-----------+----------+--------------+   Right Technical Findings: Not visualized segments include CFV, SFJ, PFV, peroneal veins.  +---------+---------------+---------+-----------+----------+--------------+ LEFT     CompressibilityPhasicitySpontaneityPropertiesThrombus Aging +---------+---------------+---------+-----------+----------+--------------+ CFV      Full           Yes      Yes                                 +---------+---------------+---------+-----------+----------+--------------+ SFJ      Full                                                        +---------+---------------+---------+-----------+----------+--------------+ FV Prox  Full                                                        +---------+---------------+---------+-----------+----------+--------------+ FV Mid   Full                                                        +---------+---------------+---------+-----------+----------+--------------+ FV DistalFull                                                        +---------+---------------+---------+-----------+----------+--------------+ POP      Full           Yes      Yes                                 +---------+---------------+---------+-----------+----------+--------------+ PTV      Full                                                        +---------+---------------+---------+-----------+----------+--------------+   Left Technical Findings: Not  visualized segments include PFV, peroneal veins.   Summary: RIGHT: - There is no evidence of deep vein thrombosis in the lower extremity. However, portions of this examination were limited- see technologist comments above.  - No cystic structure found in the popliteal fossa.  LEFT: - There is no evidence of deep vein thrombosis in the lower extremity. However, portions of this examination were limited- see technologist comments above.  - No cystic structure found in the popliteal fossa.  *See table(s) above for measurements and  observations. Electronically signed by Deitra Mayo MD on 03/09/2020 at 2:35:31 PM.    Final    [name] EKG SITE RITE  Result Date: 03/09/2020 If Site Rite image not attached, placement could not be confirmed due to current cardiac rhythm.   Anti-infectives: Anti-infectives (From admission, onward)   Start     Dose/Rate Route Frequency Ordered Stop   03/06/20 1200  piperacillin-tazobactam (ZOSYN) IVPB 3.375 g     3.375 g 12.5 mL/hr over 240 Minutes Intravenous Every 8 hours 03/06/20 0739     03/05/20 1400  piperacillin-tazobactam (ZOSYN) IVPB 2.25 g  Status:  Discontinued     2.25 g 100 mL/hr over 30 Minutes Intravenous Every 8 hours 03/05/20 0752 03/06/20 0739   03/04/20 2200  piperacillin-tazobactam (ZOSYN) IVPB 3.375 g  Status:  Discontinued     3.375 g 12.5 mL/hr over 240 Minutes Intravenous Every 8 hours 03/03/20 2032 03/05/20 0752   03/03/20 1545  cefoTEtan (CEFOTAN) 2 g in sodium chloride 0.9 % 100 mL IVPB     2 g 200 mL/hr over 30 Minutes Intravenous On call to O.R. 03/03/20 1532 03/04/20 0444   03/03/20 1345  piperacillin-tazobactam (ZOSYN) IVPB 3.375 g     3.375 g 12.5 mL/hr over 240 Minutes Intravenous  Once 03/03/20 1330 03/03/20 1507      Assessment/Plan: s/p Procedure(s): low anterior resection end colostomy (N/A)  Seen with CCM physician this morning WBC elevation possibly secondary to steroids Given CT Friday night, I doubt abdomen is source  of current decompensation. I suspect ostomy will slough off but remain patent. Agree with Heparin infusion again if PE suspected Continue tube feeds Appreciate CCM's care   LOS: 7 days    Coralie Keens MD 03/10/2020

## 2020-03-10 NOTE — Progress Notes (Signed)
Paged [place] to notify of significantly increased output from JP drain. 575 mL serous fluid since 0700 versus 495 in prior 24 hours. UOP 43 mL so far today; urine is amber in color. 1 L LR bolus currently infusing.

## 2020-03-10 NOTE — Progress Notes (Signed)
Spoke with Dr Carlis Abbott re PICC order.  Pt has CVC .  Okay to cancel PICC order.

## 2020-03-10 NOTE — Progress Notes (Signed)
[place] for IV heparin Indication: PE  Allergies  Allergen Reactions  . Codeine Swelling    Patient Measurements: Height: 5\' 2"  (157.5 cm) Weight: 160 lb 7.9 oz (72.8 kg) IBW/kg (Calculated) : 50.1 Heparin Dosing Weight: 66 kg  Vital Signs: Temp: 99.1 F (37.3 C) (03/28 1700) BP: 107/89 (03/28 1730) Pulse Rate: 107 (03/28 1700)  Labs: Recent Labs    03/09/20 0517 03/09/20 [id number] 03/09/20 0654 03/09/20 0907 03/09/20 1307 03/09/20 1307 03/09/20 1752 03/10/20 0549 03/10/20 1721  HGB   < >  --   --   --  12.5   < > 11.5* 10.9*  --   HCT   < >  --   --   --  39.3  --  36.3 34.0*  --   PLT   < >  --   --   --  336  --  385 399  --   LABPROT  --   --   --   --   --   --  16.2*  --   --   INR  --   --   --   --   --   --  1.3*  --   --   HEPARINUNFRC  --  0.48  --   --   --   --   --   --  0.75*  CREATININE   < >  --   --   --  1.60*  --  1.74* 2.22*  --   TROPONINIHS  --   --    < > 4 5  --  4  --   --    < > = values in this interval not displayed.    Estimated Creatinine Clearance: 23.3 mL/min (A) (by C-G formula based on SCr of 2.22 mg/dL (H)).   Medical History: Past Medical History:  Diagnosis Date  . Asthma   . COPD (chronic obstructive pulmonary disease) ([place]) 2011   FeV1 31% predicted FeV1/FVX 47 %  . Hemorrhoid   . Osteopenia   . Seasonal allergies    takes Claritin daily prn  . Vitamin D deficiency    takes Vit d every 14 days    Medications: Pt not on anticoagulants PTA  Assessment: 69 yo female s/p emergent low anterior resection/colostomy on 3/21 due to perforated sigmoid colon. Pt transferred to ICU on 3/26 with SOB/tachypnea and heparin drip was empirically started for r/o PE. Pt received contrast on 3/21 and again on 3/26 for CT abd/pelvis and was unable to receive more contrast at that time. IV UFH was transitioned back to LMWH for DVT ppx on 3/27.   Patient currently intubated, pharmacy consulted on  3/28 to restart IV UFH for pulmonary embolism. Progress notes from 3/27 documenting blood in colostomy, possible hematoma. Confirmed with surgery that it is ok to initiate heparin with bolus for PE at this time.  Today, 03/10/20  CBC: Hgb low and trending down; Plt WNL  SCr 2.22, CrCl ~23 mL/min. Worsening renal function  Heparin bolus 2000 units, infusion at 1000 units/hr - begun ~ 9am (previously therapeutic on this rate)  1721 Hep level = 0.75 units/ml, sl above therapeutic range, no sign of bleed  Goal of Therapy:  Heparin level 0.3-0.7 units/ml Monitor platelets by anticoagulation protocol: Yes   Plan:   Reduce Heparin infusion to 800 units/hr  Next Heparin level with am labs  CBC, HL daily while on heparin  drip  Monitor for signs/symptoms of bleeding  Minda Ditto, PharmD 03/10/2020,6:27 PM

## 2020-03-10 NOTE — Progress Notes (Signed)
I updated Tracy [name] at bedside.  We reviewed his wife's ongoing care and his previous discussions with other physicians.  He has been in touch with family and understands her stenosis with baseline severe COPD, newly diagnosed advanced cancer, and now vent dependent respiratory failure.  He wishes to continue her current care.  In the event of a cardiac arrest, he does not want resuscitation.  CODE STATUS changed to DNR.  Bedside RN present throughout the discussion.  Julian Hy, DO 03/10/20 8:24 AM  Pulmonary & Critical Care

## 2020-03-11 DIAGNOSIS — J441 Chronic obstructive pulmonary disease with (acute) exacerbation: Secondary | ICD-10-CM

## 2020-03-11 DIAGNOSIS — Z9911 Dependence on respirator [ventilator] status: Secondary | ICD-10-CM

## 2020-03-11 LAB — CBC
HCT: 22.3 % — ABNORMAL LOW (ref 36.0–46.0)
Hemoglobin: 7.1 g/dL — ABNORMAL LOW (ref 12.0–15.0)
MCH: 31.7 pg (ref 26.0–34.0)
MCHC: 31.8 g/dL (ref 30.0–36.0)
MCV: 99.6 fL (ref 80.0–100.0)
Platelets: 249 10*3/uL (ref 150–400)
RBC: 2.24 MIL/uL — ABNORMAL LOW (ref 3.87–5.11)
RDW: 12.8 % (ref 11.5–15.5)
WBC: 21.9 10*3/uL — ABNORMAL HIGH (ref 4.0–10.5)
nRBC: 0.2 % (ref 0.0–0.2)

## 2020-03-11 LAB — CREATININE, FLUID (PLEURAL, PERITONEAL, JP DRAINAGE): Creat, Fluid: 3 mg/dL

## 2020-03-11 LAB — BASIC METABOLIC PANEL
Anion gap: 13 (ref 5–15)
BUN: 72 mg/dL — ABNORMAL HIGH (ref 8–23)
CO2: 24 mmol/L (ref 22–32)
Calcium: 7.8 mg/dL — ABNORMAL LOW (ref 8.9–10.3)
Chloride: 99 mmol/L (ref 98–111)
Creatinine, Ser: 2.93 mg/dL — ABNORMAL HIGH (ref 0.44–1.00)
GFR calc Af Amer: 19 mL/min — ABNORMAL LOW (ref >60)
GFR calc non Af Amer: 16 mL/min — ABNORMAL LOW (ref >60)
Glucose, Bld: 177 mg/dL — ABNORMAL HIGH (ref 70–99)
Potassium: 4.3 mmol/L (ref 3.5–5.1)
Sodium: 136 mmol/L (ref 135–145)

## 2020-03-11 LAB — GLUCOSE, CAPILLARY
Glucose-Capillary: 151 mg/dL — ABNORMAL HIGH (ref 70–99)
Glucose-Capillary: 154 mg/dL — ABNORMAL HIGH (ref 70–99)
Glucose-Capillary: 179 mg/dL — ABNORMAL HIGH (ref 70–99)
Glucose-Capillary: 152 mg/dL — ABNORMAL HIGH (ref 70–99)
Glucose-Capillary: 106 mg/dL — ABNORMAL HIGH (ref 70–99)

## 2020-03-11 LAB — MAGNESIUM: Magnesium: 2.5 mg/dL — ABNORMAL HIGH (ref 1.7–2.4)

## 2020-03-11 LAB — LACTIC ACID, PLASMA: Lactic Acid, Venous: 1.3 mmol/L (ref 0.5–1.9)

## 2020-03-11 LAB — PROCALCITONIN: Procalcitonin: 49.27 ng/mL

## 2020-03-11 LAB — HEPARIN LEVEL (UNFRACTIONATED)
Heparin Unfractionated: 0.37 IU/mL (ref 0.30–0.70)
Heparin Unfractionated: <0.1 IU/mL — ABNORMAL LOW (ref 0.30–0.70)
Heparin Unfractionated: 0.14 IU/mL — ABNORMAL LOW (ref 0.30–0.70)

## 2020-03-11 LAB — HEMOGLOBIN A1C
Hgb A1c MFr Bld: 5.9 % — ABNORMAL HIGH (ref 4.8–5.6)
Mean Plasma Glucose: 122.63 mg/dL

## 2020-03-11 LAB — PHOSPHORUS: Phosphorus: 5 mg/dL — ABNORMAL HIGH (ref 2.5–4.6)

## 2020-03-11 MED ORDER — SODIUM CHLORIDE 0.9% FLUSH: 10.0000 mL | INTRAVENOUS | Status: DC | PRN

## 2020-03-11 MED ORDER — PIPERACILLIN-TAZOBACTAM IN DEX 2-0.25 GM/50ML IV SOLN
2.2500 g | Freq: Four times a day (QID) | INTRAVENOUS | Status: DC
  Administered 2020-03-11: 2.25 g via INTRAVENOUS
  Filled 2020-03-11 (×2): qty 50

## 2020-03-11 MED ORDER — HEPARIN BOLUS VIA INFUSION
2000.0000 [IU] | Freq: Once | INTRAVENOUS | Status: AC
  Administered 2020-03-11: 2000 [IU] via INTRAVENOUS
  Filled 2020-03-11: qty 2000

## 2020-03-11 MED ORDER — SODIUM CHLORIDE 0.9% FLUSH
10.0000 mL | Freq: Two times a day (BID) | INTRAVENOUS | Status: DC
  Administered 2020-03-11: 40 mL
  Administered 2020-03-11: 04:00:00 10 mL
  Administered 2020-03-11: 20 mL
  Administered 2020-03-12 (×2): 10 mL
  Administered 2020-03-13: 40 mL
  Administered 2020-03-13 – 2020-03-14 (×2): 10 mL
  Administered 2020-03-15: 40 mL
  Administered 2020-03-16 – 2020-03-17 (×3): 10 mL
  Administered 2020-03-18: 40 mL
  Administered 2020-03-18 – 2020-03-19 (×2): 10 mL
  Administered 2020-03-20: 15 mL
  Administered 2020-03-20: 20 mL
  Administered 2020-03-21 – 2020-03-23 (×3): 10 mL

## 2020-03-11 MED ORDER — SODIUM CHLORIDE 0.9 % IV SOLN
500.0000 mg | Freq: Two times a day (BID) | INTRAVENOUS | Status: DC
  Administered 2020-03-11 – 2020-03-15 (×8): 500 mg via INTRAVENOUS
  Filled 2020-03-11 (×2): qty 0.5
  Filled 2020-03-11 (×4): qty 500
  Filled 2020-03-11: qty 0.5
  Filled 2020-03-11: qty 500

## 2020-03-11 MED ORDER — ALBUMIN HUMAN 25 % IV SOLN
25.0000 g | INTRAVENOUS | Status: AC
  Administered 2020-03-11 (×2): 25 g via INTRAVENOUS
  Filled 2020-03-11 (×2): qty 100

## 2020-03-11 MED ORDER — HEPARIN (PORCINE) 25000 UT/250ML-% IV SOLN
950.0000 [IU]/h | INTRAVENOUS | Status: DC
  Administered 2020-03-12: 950 [IU]/h via INTRAVENOUS
  Filled 2020-03-11: qty 250

## 2020-03-11 NOTE — Progress Notes (Signed)
OT Cancellation Note  Patient Details Name: Tracy [name] MRN: [id number] DOB: Jan 22, 1951   Cancelled Treatment:    Reason Eval/Treat Not Completed: Medical issues which prohibited therapy.  Pt sedated on vent.  Will reattempt.  [name]., OTR/L Acute Rehabilitation Services Pager [phone] Office [phone]   [name] 03/11/2020, 1:22 PM

## 2020-03-11 NOTE — Consult Note (Signed)
[facility] Nurse ostomy follow up Seen today for extended visit for stomal assessment with K. Rayburn. Dr. Rockne Coons also in to assess during visit.  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 Os in middle of necrotic tissue. Tightening at fascia level to approximately 1 inch in diameter. Stomal assessment/size: See above.  Diameter is approximately 1 and 1/2 inches x 2 inches oval. Peristomal assessment: Intact. Suprapubic and other abdominal edema noted.  RLQ JP drain putting out voluminous clear yellow fluid.  Tests to identify if that fluid is urine are ordered. Treatment options for stomal/peristomal skin: skin barrier ring applied to distal 1/2 of parastomal skin beneath prolapse. Output: Bloody. Odor consistent with nonviable tissue.  Ostomy pouching: 2pc. 2 and 3/4 inch pouching system selected today and aperture cut wide to accommodate prolapse.  Exposed skin at proximal 1/2 of parastomal skin (least likely to come in contact with effluent). Two and 3/4 inch skin barrier adhesive is trimmed to accommodate midline wound. Education provided: None today. Support provided to patient's husband who remains vigilant at the patient's bedside. Enrolled patient in [place] Discharge program: Yes, previously.  [facility] nursing team will follow, and will remain available to this patient, the nursing, surgical and medical teams.    Thanks, Maudie Flakes, MSN, RN, [place], Arther Abbott  Pager# [phone]

## 2020-03-11 NOTE — Progress Notes (Signed)
PHARMACY NOTE:  ANTIMICROBIAL RENAL DOSAGE ADJUSTMENT  Current antimicrobial regimen includes a mismatch between antimicrobial dosage and estimated renal function.  As per policy approved by the Pharmacy & Therapeutics and Medical Executive Committees, the antimicrobial dosage will be adjusted accordingly.  Current antimicrobial dosage:  Zosyn Indication: IAI  Renal Function:  Estimated Creatinine Clearance: 17.6 mL/min (A) (by C-G formula based on SCr of 2.93 mg/dL (H)).    Antimicrobial dosage has been changed to:  Zosyn 2.25g IV q6h  Additional comments:   Thank you for allowing pharmacy to be a part of this patient's care.  Gretta Arab PharmD, BCPS Pager: [phone] 03/11/2020 7:34 AM

## 2020-03-11 NOTE — Progress Notes (Signed)
[facility] Progress Note  8 Days Post-Op  Subjective: Patient on the ventilator but responsive. Stoma with bloody output and more necrotic, having some leakage around pouch. Drain with high output in last 24-48 hrs. Low UOP with increasing Cr. Has tolerated TFs  Objective: Vital signs in last 24 hours: Temp:  [97.7 F (36.5 C)-100 F (37.8 C)] 100 F (37.8 C) (03/29 1000) Pulse Rate:  [90-114] 102 (03/29 1000) Resp:  [8-19] 15 (03/29 1000) BP: (68-135)/(45-92) 108/67 (03/29 1000) SpO2:  [88 %-100 %] 100 % (03/29 1000) FiO2 (%):  [40 %] 40 % (03/29 0839) Weight:  [72.1 kg] 72.1 kg (03/29 0446) Last BM Date: 03/10/20  Intake/Output from previous day: 03/28 0701 - 03/29 0700 In: 2524.3 [I.V.:646.2; RL:[id number]; IV Piggyback:415.1] Out: 2988 [Urine:163; Drains:2600; Stool:225] Intake/Output this shift: Total I/O In: 479.6 [I.V.:198.4; NG/GT:231.3; IV Piggyback:49.8] Out: 200 [Drains:200]  PE: General: on the vent, opens eyes to voice and nodded today Heart: sinus tachycardia, edematous all over Lungs:diminished bilaterally, vent Abd: soft, NT, ND, +BS,stoma necrotic and sloughing, frank blood in ostomy appliance, fascial defect for ostomy feels tight but I'm able to digitize stoma, midline wound clean, drain in RLQ with yellow clear fluid and high output   Lab Results:  Recent Labs    03/09/20 1752 03/10/20 0549  WBC 27.2* 36.6*  HGB 11.5* 10.9*  HCT 36.3 34.0*  PLT 385 399   BMET Recent Labs    03/10/20 0549 03/11/20 0500  NA 134* 136  K 4.2 4.3  CL 98 99  CO2 23 24  GLUCOSE 197* 177*  BUN 49* 72*  CREATININE 2.22* 2.93*  CALCIUM 7.6* 7.8*   PT/INR Recent Labs    03/09/20 1752  LABPROT 16.2*  INR 1.3*   CMP     Component Value Date/Time   NA 136 03/11/2020 0500   K 4.3 03/11/2020 0500   CL 99 03/11/2020 0500   CO2 24 03/11/2020 0500   GLUCOSE 177 (H) 03/11/2020 0500   BUN 72 (H) 03/11/2020 0500   CREATININE 2.93 (H) 03/11/2020  0500   CALCIUM 7.8 (L) 03/11/2020 0500   PROT 4.7 (L) 03/10/2020 0549   ALBUMIN 1.8 (L) 03/10/2020 0549   AST 282 (H) 03/10/2020 0549   ALT 148 (H) 03/10/2020 0549   ALKPHOS 63 03/10/2020 0549   BILITOT 0.8 03/10/2020 0549   GFRNONAA 16 (L) 03/11/2020 0500   GFRAA 19 (L) 03/11/2020 0500   Lipase     Component Value Date/Time   LIPASE 18 03/03/2020 1214       Studies/Results: DG Abd 1 View  Result Date: 03/09/2020 CLINICAL DATA:  OG tube placement EXAM: ABDOMEN - 1 VIEW COMPARISON:  None. FINDINGS: A feeding tube is identified with distal tip in the proximal stomach. Air is noted in the colon. IMPRESSION: Feeding tube identified with distal tip in the proximal stomach. Electronically Signed   By: Abelardo Diesel M.D.   On: 03/09/2020 12:34   DG CHEST PORT 1 VIEW  Result Date: 03/10/2020 CLINICAL DATA:  Shortness of breath EXAM: PORTABLE CHEST 1 VIEW COMPARISON:  03/09/2020 FINDINGS: Patient is rotated. Endotracheal tube with the tip 4.5 cm above the carina. Nasogastric tube with the tip projecting over the stomach. Left jugular central venous catheter with the tip projecting over the SVC. Linear hyperdensity projecting over the right hilum measuring 4.6 cm in length of uncertain etiology. Persistent patchy right upper lobe airspace disease. No other areas of focal consolidation. Hyperinflated lungs as can be  seen with COPD. No pleural effusion or pneumothorax. Stable cardiomediastinal silhouette. No aggressive osseous lesion. IMPRESSION: Support lines and tubing in satisfactory position. Persistent patchy right upper lobe airspace disease consistent with pneumonia. Linear hyperdensity projecting over the right hilum measuring 4.6 cm in length of uncertain etiology. This may reflect a foreign body superficial to the patient versus artifact. Recommend correlation with physical exam. This is unlikely to reflect a retained wire given that the appearance was present on x-ray dated 03/07/2020 prior  to placement of a central venous catheter. Electronically Signed   By: Kathreen Devoid   On: 03/10/2020 05:54   DG CHEST PORT 1 VIEW  Result Date: 03/09/2020 CLINICAL DATA:  Endotracheal tube and central line placement EXAM: PORTABLE CHEST 1 VIEW COMPARISON:  March 08, 2020 FINDINGS: In tracheal tube is identified distal tip 4.5 cm from carina. Nasogastric tube is identified distal tip not included on the film but is at least in the stomach. Left central venous line is identified with distal tip in the superior vena cava. Emphysematous changes of bilateral lungs are identified. Small left pleural effusion is noted. Mild patchy opacity is identified the medial bilateral upper lobes and lateral left upper lobe. Bony structures are stable. IMPRESSION: 1. Endotracheal tube distal tip 4.5 cm from carina. 2. Left central venous line is identified with distal tip in the superior vena cava. No pneumothorax. 3. Opacities of bilateral lungs, developing pneumonia is not excluded. Electronically Signed   By: Abelardo Diesel M.D.   On: 03/09/2020 12:32   ECHOCARDIOGRAM COMPLETE  Result Date: 03/09/2020    ECHOCARDIOGRAM REPORT   Patient Name:   Tracy [name] Date of Exam: 03/09/2020 Medical Rec #:  [id number]        Height:       62.0 in Accession #:    [id number]       Weight:       156.1 lb Date of Birth:  11-28-1951        BSA:          1.721 m Patient Age:    37 years         BP:           107/53 mmHg Patient Gender: F                HR:           92 bpm. Exam Location:  Inpatient Procedure: 2D Echo Indications:    518.82 acute respiratory insufficiency  History:        Patient has no prior history of Echocardiogram examinations.                 COPD.  Sonographer:    Jannett Celestine RDCS (AE) Referring Phys: [id number]  Sonographer Comments: Suboptimal apical window, no parasternal window and echo performed with patient supine and on artificial respirator. Image acquisition challenging due to respiratory motion  and Image acquisition challenging due to breast implants. IMPRESSIONS  1. Extremely difficult study and many windows are non-diagnostic. Overall, LV function is normal. The RV is severely dilated with moderately reduced function. Findings could be related to pulmonary embolism or pulmonary hypertension. Clinical correlation is recommended.  2. Left ventricular ejection fraction, by estimation, is 60 to 65%. The left ventricle has normal function. The left ventricle has no regional wall motion abnormalities. Left ventricular diastolic function could not be evaluated. There is the interventricular septum is flattened in systole, consistent with right ventricular pressure overload.  3. Right ventricular systolic function is moderately reduced. The right ventricular size is severely enlarged. Tricuspid regurgitation signal is inadequate for assessing PA pressure.  4. Right atrial size was mild to moderately dilated.  5. The mitral valve is grossly normal. No evidence of mitral valve regurgitation. No evidence of mitral stenosis.  6. The aortic valve is grossly normal. Aortic valve regurgitation is not visualized. No aortic stenosis is present.  7. The inferior vena cava is normal in size with greater than 50% respiratory variability, suggesting right atrial pressure of 3 mmHg. FINDINGS  Left Ventricle: Left ventricular ejection fraction, by estimation, is 60 to 65%. The left ventricle has normal function. The left ventricle has no regional wall motion abnormalities. The left ventricular internal cavity size was small. There is no left ventricular hypertrophy. The interventricular septum is flattened in systole, consistent with right ventricular pressure overload. Left ventricular diastolic function could not be evaluated. Right Ventricle: The right ventricular size is severely enlarged. No increase in right ventricular wall thickness. Right ventricular systolic function is moderately reduced. Tricuspid regurgitation  signal is inadequate for assessing PA pressure. Left Atrium: Left atrial size was normal in size. Right Atrium: Right atrial size was mild to moderately dilated. Pericardium: Trivial pericardial effusion is present. Presence of pericardial fat pad. Mitral Valve: The mitral valve is grossly normal. No evidence of mitral valve regurgitation. No evidence of mitral valve stenosis. Tricuspid Valve: The tricuspid valve is grossly normal. Tricuspid valve regurgitation is trivial. No evidence of tricuspid stenosis. Aortic Valve: The aortic valve is grossly normal. Aortic valve regurgitation is not visualized. No aortic stenosis is present. Pulmonic Valve: The pulmonic valve was not well visualized. Pulmonic valve regurgitation is not visualized. Aorta: The aortic root is normal in size and structure. Venous: The inferior vena cava is normal in size with greater than 50% respiratory variability, suggesting right atrial pressure of 3 mmHg. IAS/Shunts: The atrial septum is grossly normal.  RIGHT VENTRICLE TAPSE (M-mode): 1.4 cm RIGHT ATRIUM           Index RA Area:     11.90 cm RA Volume:   27.00 ml  15.69 ml/m  AORTIC VALVE LVOT Vmax:   84.20 cm/s LVOT Vmean:  54.200 cm/s LVOT VTI:    0.135 m MITRAL VALVE MV Area (PHT): 4.06 cm    SHUNTS MV Decel Time: 187 msec    Systemic VTI: 0.14 m MV E velocity: 68.40 cm/s Eleonore Chiquito MD Electronically signed by Eleonore Chiquito MD Signature Date/Time: 03/09/2020/4:34:06 PM    Final    VAS [name] LOWER EXTREMITY VENOUS (DVT)  Result Date: 03/09/2020  Lower Venous DVTStudy Indications: Edema.  Limitations: Body habitus, poor ultrasound/tissue interface, line, and intubated. Comparison Study: No prior study Performing Technologist: Maudry Mayhew MHA, RDMS, RVT, RDCS  Examination Guidelines: A complete evaluation includes B-mode imaging, spectral Doppler, color Doppler, and power Doppler as needed of all accessible portions of each vessel. Bilateral testing is considered an integral  part of a complete examination. Limited examinations for reoccurring indications may be performed as noted. The reflux portion of the exam is performed with the patient in reverse Trendelenburg.  +---------+---------------+---------+-----------+----------+--------------+ RIGHT    CompressibilityPhasicitySpontaneityPropertiesThrombus Aging +---------+---------------+---------+-----------+----------+--------------+ FV Prox  Full                    Yes                                 +---------+---------------+---------+-----------+----------+--------------+  FV Mid   Full                    Yes                                 +---------+---------------+---------+-----------+----------+--------------+ FV DistalFull                    Yes                                 +---------+---------------+---------+-----------+----------+--------------+ POP      Full           Yes      Yes                                 +---------+---------------+---------+-----------+----------+--------------+ PTV      Full                                                        +---------+---------------+---------+-----------+----------+--------------+   Right Technical Findings: Not visualized segments include CFV, SFJ, PFV, peroneal veins.  +---------+---------------+---------+-----------+----------+--------------+ LEFT     CompressibilityPhasicitySpontaneityPropertiesThrombus Aging +---------+---------------+---------+-----------+----------+--------------+ CFV      Full           Yes      Yes                                 +---------+---------------+---------+-----------+----------+--------------+ SFJ      Full                                                        +---------+---------------+---------+-----------+----------+--------------+ FV Prox  Full                                                         +---------+---------------+---------+-----------+----------+--------------+ FV Mid   Full                                                        +---------+---------------+---------+-----------+----------+--------------+ FV DistalFull                                                        +---------+---------------+---------+-----------+----------+--------------+ POP      Full           Yes      Yes                                 +---------+---------------+---------+-----------+----------+--------------+  PTV      Full                                                        +---------+---------------+---------+-----------+----------+--------------+   Left Technical Findings: Not visualized segments include PFV, peroneal veins.   Summary: RIGHT: - There is no evidence of deep vein thrombosis in the lower extremity. However, portions of this examination were limited- see technologist comments above.  - No cystic structure found in the popliteal fossa.  LEFT: - There is no evidence of deep vein thrombosis in the lower extremity. However, portions of this examination were limited- see technologist comments above.  - No cystic structure found in the popliteal fossa.  *See table(s) above for measurements and observations. Electronically signed by Deitra Mayo MD on 03/09/2020 at 2:35:31 PM.    Final     Anti-infectives: Anti-infectives (From admission, onward)   Start     Dose/Rate Route Frequency Ordered Stop   03/11/20 1200  piperacillin-tazobactam (ZOSYN) IVPB 2.25 g     2.25 g 100 mL/hr over 30 Minutes Intravenous Every 6 hours 03/11/20 1058     03/06/20 1200  piperacillin-tazobactam (ZOSYN) IVPB 3.375 g  Status:  Discontinued     3.375 g 12.5 mL/hr over 240 Minutes Intravenous Every 8 hours 03/06/20 0739 03/11/20 1058   03/05/20 1400  piperacillin-tazobactam (ZOSYN) IVPB 2.25 g  Status:  Discontinued     2.25 g 100 mL/hr over 30 Minutes Intravenous Every 8 hours 03/05/20  0752 03/06/20 0739   03/04/20 2200  piperacillin-tazobactam (ZOSYN) IVPB 3.375 g  Status:  Discontinued     3.375 g 12.5 mL/hr over 240 Minutes Intravenous Every 8 hours 03/03/20 2032 03/05/20 0752   03/03/20 1545  cefoTEtan (CEFOTAN) 2 g in sodium chloride 0.9 % 100 mL IVPB     2 g 200 mL/hr over 30 Minutes Intravenous On call to O.R. 03/03/20 1532 03/04/20 0444   03/03/20 1345  piperacillin-tazobactam (ZOSYN) IVPB 3.375 g     3.375 g 12.5 mL/hr over 240 Minutes Intravenous  Once 03/03/20 1330 03/03/20 1507       Assessment/Plan COPD VDRF - unclear etiology, on heparin gtt in case of PE given increased risk, unable to get CTA at this time with increasing Cr - resp cx was ordered but I can't see where it has been sent - CXR yesterday with RUL PNA AKI - Cr2.93, UOP low, checking drain creatinine to look for possible urine leak   Perforated sigmoid colon Proximal rectal cancer with obstruction S/p emergent Low anterior resection/ colostomy by Dr. Marcello Moores 3/21 - POD#8 - continue daily dressing changes - WOC following for ostomy teaching - stoma is necrotic and sloughing, fascial opening feels tight but will continue to monitor closely, may need revision  - WBC trending up and is 36 today, continue IV abx - drain with 2.6 L out in last 24 hrs and fluid appears like urine, check drain creatinine  - has been on TFs - hold today    FEN -NPO, IVF; hold TFs VTE - SCDs, lovenox ID - [id number]>> Foley - in place, urine amber Follow up - Dr. Marcello Moores  LOS: 8 days    Brigid Re , [facility] 03/11/2020, 11:00 AM Please see Amion for pager number during day hours 7:00am-4:30pm

## 2020-03-11 NOTE — Progress Notes (Addendum)
Pharmacy Brief Note - Anticoagulation Follow Up:  Patient is on heparin drip for suspected PE.  For full assessment/note, see note by Gretta Arab, PharmD from earlier today.  Assessment:  HL = 0.14 remains subtherapeutic on heparin infusion of 800 units/hr  Confirmed with RN that heparin infusing at correct rate with no interruptions or issues with line. No signs of bleeding.   Goal: HL 0.3-0.7  Plan:  Heparin bolus of 2000 units once  Increase heparin infusion to 950 units/hr  Check HL in 8 hours  CBC, HL daily  Monitor for signs of bleeding  Lenis Noon, PharmD 03/11/20 10:05 PM   Brief note: CBC  H/H = 7.1/22.3, down from 3/28  (10.9/34) plts = 249 down from 3/28  (399) Daily CBC/HL  Thanks Dorrene German 03/11/2020 11:40 PM

## 2020-03-11 NOTE — Progress Notes (Signed)
[place] for IV heparin Indication: PE  Allergies  Allergen Reactions  . Codeine Swelling    Patient Measurements: Height: 5\' 2"  (157.5 cm) Weight: 158 lb 15.2 oz (72.1 kg) IBW/kg (Calculated) : 50.1 Heparin Dosing Weight: 66 kg  Vital Signs: Temp: 97.7 F (36.5 C) (03/29 0500) BP: 101/53 (03/29 0500) Pulse Rate: 95 (03/29 0500)  Labs: Recent Labs    03/09/20 0517 03/09/20 [id number] 03/09/20 0654 03/09/20 0907 03/09/20 1307 03/09/20 1307 03/09/20 1752 03/10/20 0549 03/10/20 1721 03/11/20 0500  HGB   < >  --   --   --  12.5   < > 11.5* 10.9*  --   --   HCT   < >  --   --   --  39.3  --  36.3 34.0*  --   --   PLT   < >  --   --   --  336  --  385 399  --   --   LABPROT  --   --   --   --   --   --  16.2*  --   --   --   INR  --   --   --   --   --   --  1.3*  --   --   --   HEPARINUNFRC  --  0.48  --   --   --   --   --   --  0.75* 0.37  CREATININE   < >  --   --   --  1.60*  --  1.74* 2.22*  --   --   TROPONINIHS  --   --    < > 4 5  --  4  --   --   --    < > = values in this interval not displayed.    Estimated Creatinine Clearance: 23.2 mL/min (A) (by C-G formula based on SCr of 2.22 mg/dL (H)).   Medical History: Past Medical History:  Diagnosis Date  . Asthma   . COPD (chronic obstructive pulmonary disease) ([place]) 2011   FeV1 31% predicted FeV1/FVX 47 %  . Hemorrhoid   . Osteopenia   . Seasonal allergies    takes Claritin daily prn  . Vitamin D deficiency    takes Vit d every 14 days    Medications: Pt not on anticoagulants PTA  Assessment: 69 yo female s/p emergent low anterior resection/colostomy on 3/21 due to perforated sigmoid colon. Pt transferred to ICU on 3/26 with SOB/tachypnea and heparin drip was empirically started for r/o PE. Pt received contrast on 3/21 and again on 3/26 for CT abd/pelvis and was unable to receive more contrast at that time. IV UFH was transitioned back to LMWH for DVT ppx on 3/27.    Patient currently intubated, pharmacy consulted on 3/28 to restart IV UFH for pulmonary embolism. Progress notes from 3/27 documenting blood in colostomy, possible hematoma. Confirmed with surgery that it is ok to initiate heparin with bolus for PE at this time.  Today, 03/11/20  Heparin level now therapeutic after heparin level decreased from 10 to 8 ml/hr last PM  NO reported bleeding  CBC not drawn yet - will follow up  Goal of Therapy:  Heparin level 0.3-0.7 units/ml Monitor platelets by anticoagulation protocol: Yes   Plan:   Continue IV heparin at current rate of 800 units/hr  Recheck heparin level in 6 hours as confirmatory  level  CBC, HL daily while on heparin drip  Monitor for signs/symptoms of bleeding   Adrian Saran, PharmD, BCPS 03/11/2020 5:49 AM

## 2020-03-11 NOTE — Progress Notes (Signed)
Tracy [name] for IV heparin Indication: PE  Allergies  Allergen Reactions  . Codeine Swelling    Patient Measurements: Height: 5\' 2"  (157.5 cm) Weight: 158 lb 15.2 oz (72.1 kg) IBW/kg (Calculated) : 50.1 Heparin Dosing Weight: 66 kg  Vital Signs: Temp: 100 F (37.8 C) (03/29 1000) BP: 108/67 (03/29 1000) Pulse Rate: 102 (03/29 1000)  Labs: Recent Labs    03/09/20 0517 03/09/20 [id number] 03/09/20 0654 03/09/20 0907 03/09/20 1307 03/09/20 1307 03/09/20 1752 03/10/20 0549 03/10/20 1721 03/11/20 0500  HGB   < >  --   --   --  12.5   < > 11.5* 10.9*  --   --   HCT   < >  --   --   --  39.3  --  36.3 34.0*  --   --   PLT   < >  --   --   --  336  --  385 399  --   --   LABPROT  --   --   --   --   --   --  16.2*  --   --   --   INR  --   --   --   --   --   --  1.3*  --   --   --   HEPARINUNFRC  --  0.48  --   --   --   --   --   --  0.75* 0.37  CREATININE   < >  --   --   --  1.60*   < > 1.74* 2.22*  --  2.93*  TROPONINIHS  --   --    < > 4 5  --  4  --   --   --    < > = values in this interval not displayed.    Estimated Creatinine Clearance: 17.6 mL/min (A) (by C-G formula based on SCr of 2.93 mg/dL (H)).   Medical History: Past Medical History:  Diagnosis Date  . Asthma   . COPD (chronic obstructive pulmonary disease) ([place]) 2011   FeV1 31% predicted FeV1/FVX 47 %  . Hemorrhoid   . Osteopenia   . Seasonal allergies    takes Claritin daily prn  . Vitamin D deficiency    takes Vit d every 14 days    Medications: Pt not on anticoagulants PTA  Assessment: 69 yo female s/p emergent low anterior resection/colostomy on 3/21 due to perforated sigmoid colon. Pt transferred to ICU on 3/26 with SOB/tachypnea and heparin drip was empirically started for r/o PE. Pt received contrast on 3/21 and again on 3/26 for CT abd/pelvis and was unable to receive more contrast at that time. IV UFH was transitioned back to LMWH for DVT ppx on 3/27.    Patient currently intubated, pharmacy consulted on 3/28 to restart IV UFH for pulmonary embolism. Progress notes from 3/27 documenting blood in colostomy, possible hematoma. Confirmed with surgery that it is ok to initiate heparin with bolus for PE at this time.  Today, 03/11/20  Heparin level decreased to sub-therapeutic at <0.1 on Heparin 800 units/hr.  However, RN reports that she notes IV site to be not infusing correctly.  New IV site obtained at 12:00.    RN reports continued blood in ostomy (CCS aware).  No new bleeding or complications.  RN to monitor closely for any changes/worsening.  CBC: Hgb decreased to 10.9, Plt WNL.  Goal  of Therapy:  Heparin level 0.3-0.7 units/ml Monitor platelets by anticoagulation protocol: Yes   Plan:   Continue IV heparin at 800 units/hr  Recheck 8 hour heparin level after new IV site started.  CBC, HL daily while on heparin drip  Monitor for signs/symptoms of bleeding   Gretta Arab PharmD, BCPS Pager: [phone] 03/11/2020 11:11 AM

## 2020-03-11 NOTE — Progress Notes (Signed)
Pharmacy Antibiotic Note  Tracy [name] is a 69 y.o. female admitted on 03/03/2020 with intra-abdominal infection.  Pharmacy has been consulted for meropenem dosing.  Pt is currently POD#8 after undergoing emergent low anterior resection/colostomy on 3/21/21due to perforated sigmoid colon. Pt is currently on day #8 of piperacillin/tazobactam. Antibiotics being broadened to meropenem.  Today, 03/11/20  WBC 36.6, increasing. Of note, pt also receiving IV steroids  PCT 49.27  Lactate 1.3  SCr 2.9, CrCl ~18 mL/min. Worsening renal function  Day #8 of IV antibiotics on piperacillin/tazobactam  Plan:  Discontinue piperacillin/tazobactam  Start meropenem 500 mg IV q12h (CrCl 18 mL/min)  Follow renal function for necessary dose adjustments.   Height: 5\' 2"  (157.5 cm) Weight: 158 lb 15.2 oz (72.1 kg) IBW/kg (Calculated) : 50.1  Temp (24hrs), Avg:99 F (37.2 C), Min:97.7 F (36.5 C), Max:100.2 F (37.9 C)  Recent Labs  Lab 03/08/20 0252 03/08/20 0816 03/09/20 0517 03/09/20 1307 03/09/20 1749 03/09/20 1752 03/09/20 2343 03/10/20 0549 03/11/20 0500 03/11/20 1057  WBC 22.0*  --  19.2* 26.8*  --  27.2*  --  36.6*  --   --   CREATININE 1.12*  --  1.33* 1.60*  --  1.74*  --  2.22* 2.93*  --   LATICACIDVEN  --    < >  --  2.3* 2.4*  --  2.0* 2.0*  --  1.3   < > = values in this interval not displayed.    Estimated Creatinine Clearance: 17.6 mL/min (A) (by C-G formula based on SCr of 2.93 mg/dL (H)).    Allergies  Allergen Reactions  . Codeine Swelling    Antimicrobials this admission: Cefotetan pre-op 3/21 >>  Piperacillin/tazobactam 3/21 >> 3/29 Meropenem 3/29 >>  Microbiology results: 3/27 BCx: ngtd 3/26 UCx: insignificant growth   Thank you for allowing pharmacy to be a part of this patient's care.  Lenis Noon, PharmD 03/11/2020 4:46 PM

## 2020-03-11 NOTE — Progress Notes (Signed)
NAME:  Tracy [name], MRN:  [id number], DOB:  [birth year], LOS: 8 ADMISSION DATE:  03/03/2020, CONSULTATION DATE:  3/27/021 REFERRING MD:  - Dr Ninfa Linden of [facility], CHIEF COMPLAINT:  Post op copd consult   Brief History   Rectal cancer s/p colon perf, repaired 3/21 Septic shock- HAP RUL, intubated 3/27 COPD- GOLD B  History of present illness   69 year old former smoker with gold stage III-4 COPD FEV1 31% with postnasal drip.  Sees Dr. Leory Plowman Icard in the pulmonary office.  At baseline she is on Trelegy, Flonase and Claritin.  She is not on oxygen at baseline but husband reports class III dyspnea even for ADLs.  She presented on March 03, 2020 with bowel perforation and status post lower anterior resection and colostomy for newly diagnosed proximal rectal cancer associated with distal sigmoid perforation by Dr. Doreene Adas.  There was no obvious metastatic disease on the parietal peritoneum or liver.  Her postoperative course has been complicated by acute kidney injury that was improving from 2.2 mg percent on 03/06/2019 1 to 1.2 mg percent on 03/08/2020 [status post Foley removal 03/06/2020].  During rounds on the morning of 03/08/2020: White blood count is also trending down.  The stoma was felt to be improving she was getting intermittent opioids for pain and also symptomatic nausea treatment.  She also underwent CT abdomen with contrast that showed possible liver lesion along with postop ileus  On the night of March 08, 2020 she developed acute delirium and respiratory distress with desaturation.  Empiric IV heparin was started given concern for pulmonary embolism in the setting of cancer and being bedbound for 5 days [although she was getting Lovenox DVT prophylaxis even as of 03/04/20]. CCM consulted am of 03/09/20 for copd. PAtient found to be in severe resp distress with bilateral faint wheezing and not responding to ccm interventions of fentanyl, nebulizer, bipap, mag and solumedrol  BP low  and fluids responsive  Past Medical History  COPD 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 Allergies Vitamin D deficiency Newly diagnosed colon cancer  Significant Hospital Events   03/03/2020 - admitt - bowel perforation and status post lower anterior resection and colostomy for proximal rectal cancer associated with Distal sigmoid perforation by   Consults:  3/23 -nephro consult 3/27 - ccm consult  Procedures:  3/27 intubation 3/27 CVC  Significant Diagnostic Tests:  3/21 - rectal path> 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  RV volume and pressure overload.  Mildly dilated RA.  Micro Data:  COVID 3/21 >> Negative MRSA PCR 3/21 >> Negative Blood cultures 3/27 >>  Urine culture 3/26 >> insignificant growth   Antimicrobials:  Zosyn 3/21 >>  Interim history/subjective:  Lying in bed sedated on vent, husband at bedside. RN reports no acute events overnight.   Objective   Blood pressure (!) 106/49, pulse 96, temperature 97.7 F (36.5 C), resp. rate 18, height 5\' 2"  (1.575 m), weight 72.1 kg, last menstrual period 12/14/2000, SpO2 97 %.    Vent Mode: PRVC FiO2 (%):  [40 %] 40 % Set Rate:  [18 bmp] 18 bmp Vt Set:  [400 mL] 400 mL PEEP:  [5 cmH20] 5 cmH20 Plateau Pressure:  [15 cmH20-27 cmH20] 15 cmH20   Intake/Output Summary (Last 24 hours) at 03/11/2020 0743 Last data filed at 03/11/2020 0726 Gross per  24 hour  Intake 2524.28 ml  Output 3113 ml  Net -588.72 ml   Filed Weights   03/09/20 0416 03/10/20 0500 03/11/20 0446  Weight: 70.8 kg 72.8 kg 72.1 kg    Examination: General: Chronically ill appearing elderly female on mechanical ventilation, in NAD HEENT: ETT, MM pink/moist, PERRL, sclera non-icteric  Neuro: Sedated on vent but will open eyes briefly  to both verbal and physical stimuli, able to follow some simple commands CV: s1s2 regular rate and rhythm, no murmur, rubs, or gallops,  PULM:  Faint expiratory wheeze, no increased work of breathing, comfortable on vent with mild obstruction,  GI: soft, bowel sounds active in all 4 quadrants, non-tender, JP drain in place with serious drainage, left sided ostomy WNL Extremities: warm/dry, no edema  Skin: no rashes or lesions  Resolved Hospital Problem list     Assessment & Plan:   Acute hypoxic respiratory failure  Baseline Gold stage III/IV COPD but not on home oxygen.  -Unclear etiology of her deterioration, but with echocardiogram demonstrating RV strain and no previous for comparison, there is concern for acute PE despite negative lower extremity Dopplers.  An aspiration event is also possible. HAP and COPD exacerbation possible.  -If PE present, not a candidate for thrombolytics given recent significant abdominal surgery Plan: Continue ventilator support with lung protective strategies  Wean PEEP and FiO2 for sats greater than 90%. Head of bed elevated 30 degrees. Plateau pressures less than 30 cm H20.  Follow intermittent chest x-ray and ABG.   SAT/SBT as tolerated, mentation preclude extubation  Ensure adequate pulmonary hygiene  Follow cultures  VAP bundle in place  PAD protocol, RASS goal 0 to -1 Continue steroids  Continue bronchodilators  Continue heparin drip  Acute delirium 03/08/2020 late p.m.  -Secondary to use of opioids, COPD exacerbations and recent surgery Plan:  Minimize sedation as able  Delirium precautions  PAD protocol  RASS goal 0 to -1  Shock, improving  -obstructive vs septic vs due to sedation Plan: Continue vasopressors, wean as tolerated Close observations of vitals in the ICU setting  MAP goal greater than 67  Newly diagnosed colon cancer with perforation causing peritonitis, status post resection on 3/21 Plan: Continue IV antibiotics   General surgery following JP drain per surgery  Follow cultures  Trend WBC and fever curve   Acute kidney injury on 03/04/2020.   -Improved and near normalized on 03/08/2020 and status post CT abdomen with dye load and shock.  Now recurrent AKI -Currently +6L Plan: Follow renal function / urine output Trend Bmet Avoid nephrotoxins, ensure adequate renal perfusion  IV hydration Strict I/O  At risk malnutrition Plan: Continue tube feed  Supplement vitamins and protein  Enteral hydration   Acute anemia -likely due to dilution, blood loss, critical illness Plan: Transfuse per protocol  Hemoglobin goal greater than 7 Trend CBC   Hyperglycemia -no previous known history of diabetes Plan: SSI as needed  CBG checks q4hrs   Best practice:  Diet: TF Pain/Anxiety/Delirium protocol (if indicated): PAD protocol VAP protocol (if indicated): HOB > 30 DVT prophylaxis: heparin GI prophylaxis: ppi Glucose control: ssi Mobility: bed rest Code Status: full code Family Communication: husband updated at bedside 3/29 Disposition: ICU  LABS    PULMONARY Recent Labs  Lab 03/08/20 2240 03/09/20 0545 03/09/20 1005  PHART 7.380 7.305* 7.330*  PCO2ART 42.0 47.1 46.1  PO2ART 67.6* 63.3* 260*  HCO3 24.4 23.0 23.9  O2SAT 92.2 90.2 99.3    CBC Recent Labs  Lab 03/09/20 1307 03/09/20 1752 03/10/20 0549  HGB 12.5 11.5* 10.9*  HCT 39.3 36.3 34.0*  WBC 26.8* 27.2* 36.6*  PLT 336 385 399    COAGULATION Recent Labs  Lab 03/09/20 1752  INR 1.3*    CARDIAC  No results for input(s): TROPONINI in the last 168 hours. No results for input(s): PROBNP in the last 168 hours.   CHEMISTRY Recent Labs  Lab 03/05/20 1446 03/06/20 0236 03/09/20 0517 03/09/20 0517 03/09/20 1307 03/09/20 1307 03/09/20 1752 03/09/20 1752 03/10/20 0549 03/11/20 0500  NA 136   < > 136  --  134*  --  136  --  134* 136  K 3.8   < > 4.9   < > 4.3   < > 4.6   < > 4.2 4.3  CL 102   < > 100  --   99  --  100  --  98 99  CO2 21*   < > 20*  --  23  --  22  --  23 24  GLUCOSE 241*   < > 99  --  144*  --  195*  --  197* 177*  BUN 45*   < > 35*  --  38*  --  42*  --  49* 72*  CREATININE 2.29*   < > 1.33*  --  1.60*  --  1.74*  --  2.22* 2.93*  CALCIUM 7.9*   < > 8.0*  --  7.3*  --  7.2*  --  7.6* 7.8*  MG  --   --   --   --   --   --   --   --  2.3 2.5*  PHOS 4.7*  --   --   --   --   --   --   --  4.3 5.0*   < > = values in this interval not displayed.   Estimated Creatinine Clearance: 17.6 mL/min (A) (by C-G formula based on SCr of 2.93 mg/dL (H)).   LIVER Recent Labs  Lab 03/05/20 1446 03/09/20 1307 03/09/20 1752 03/10/20 0549  AST  --  429* 440* 282*  ALT  --  131* 152* 148*  ALKPHOS  --  61 57 63  BILITOT  --  1.3* 1.0 0.8  PROT  --  4.4* 4.2* 4.7*  ALBUMIN 2.4* 1.6* 1.8* 1.8*  INR  --   --  1.3*  --      INFECTIOUS Recent Labs  Lab 03/09/20 1307 03/09/20 1307 03/09/20 1749 03/09/20 2343 03/10/20 0549 03/11/20 0500  LATICACIDVEN 2.3*   < > 2.4* 2.0* 2.0*  --   PROCALCITON 34.99  --   --   --  27.18 49.27   < > = values in this interval not displayed.     ENDOCRINE CBG (last 3)  Recent Labs    03/10/20 2006 03/10/20 2335 03/11/20 0350  GLUCAP 162* 150* 151*    CRITICAL CARE Performed by: Johnsie Cancel  Total critical care time: 39 minutes  Critical care time was exclusive of separately billable procedures and treating other patients.  Critical care was necessary to treat or prevent imminent or life-threatening deterioration.  Critical care was time spent personally by me on the following activities: development of treatment plan with patient and/or surrogate as well as nursing, discussions with consultants, evaluation of patient's response to treatment, examination of patient, obtaining history from patient or surrogate, ordering and performing treatments and interventions, ordering and review  of laboratory studies, ordering and review of  radiographic studies, pulse oximetry and re-evaluation of patient's condition.  Johnsie Cancel, NP-C [facility] Contact / Pager information can be found on Amion  03/11/2020, 8:03 AM

## 2020-03-12 ENCOUNTER — Inpatient Hospital Stay (HOSPITAL_COMMUNITY): Payer: Medicare HMO | Admitting: Certified Registered Nurse Anesthetist

## 2020-03-12 ENCOUNTER — Inpatient Hospital Stay (HOSPITAL_COMMUNITY): Payer: Medicare HMO

## 2020-03-12 ENCOUNTER — Encounter (HOSPITAL_COMMUNITY): Payer: Self-pay

## 2020-03-12 ENCOUNTER — Encounter (HOSPITAL_COMMUNITY): Admission: EM | Disposition: A | Discharge status: Rehabilitation Facility | Payer: Self-pay | Source: Home / Self Care

## 2020-03-12 DIAGNOSIS — C189 Malignant neoplasm of colon, unspecified: Secondary | ICD-10-CM

## 2020-03-12 DIAGNOSIS — D62 Acute posthemorrhagic anemia: Secondary | ICD-10-CM

## 2020-03-12 HISTORY — PX: LAPAROTOMY: SHX154

## 2020-03-12 LAB — BASIC METABOLIC PANEL
Anion gap: 11 (ref 5–15)
Anion gap: 12 (ref 5–15)
BUN: 86 mg/dL — ABNORMAL HIGH (ref 8–23)
BUN: 80 mg/dL — ABNORMAL HIGH (ref 8–23)
CO2: 25 mmol/L (ref 22–32)
CO2: 21 mmol/L — ABNORMAL LOW (ref 22–32)
Calcium: 7.8 mg/dL — ABNORMAL LOW (ref 8.9–10.3)
Calcium: 7.6 mg/dL — ABNORMAL LOW (ref 8.9–10.3)
Chloride: 101 mmol/L (ref 98–111)
Chloride: 105 mmol/L (ref 98–111)
Creatinine, Ser: 2.81 mg/dL — ABNORMAL HIGH (ref 0.44–1.00)
Creatinine, Ser: 2.29 mg/dL — ABNORMAL HIGH (ref 0.44–1.00)
GFR calc Af Amer: 19 mL/min — ABNORMAL LOW (ref >60)
GFR calc Af Amer: 25 mL/min — ABNORMAL LOW (ref >60)
GFR calc non Af Amer: 17 mL/min — ABNORMAL LOW (ref >60)
GFR calc non Af Amer: 22 mL/min — ABNORMAL LOW (ref >60)
Glucose, Bld: 133 mg/dL — ABNORMAL HIGH (ref 70–99)
Glucose, Bld: 148 mg/dL — ABNORMAL HIGH (ref 70–99)
Potassium: 3.9 mmol/L (ref 3.5–5.1)
Potassium: 3.6 mmol/L (ref 3.5–5.1)
Sodium: 137 mmol/L (ref 135–145)
Sodium: 138 mmol/L (ref 135–145)

## 2020-03-12 LAB — CBC
HCT: 21.9 % — ABNORMAL LOW (ref 36.0–46.0)
HCT: 37.1 % (ref 36.0–46.0)
Hemoglobin: 7 g/dL — ABNORMAL LOW (ref 12.0–15.0)
Hemoglobin: 12.1 g/dL (ref 12.0–15.0)
MCH: 32.1 pg (ref 26.0–34.0)
MCH: 31.1 pg (ref 26.0–34.0)
MCHC: 32 g/dL (ref 30.0–36.0)
MCHC: 32.6 g/dL (ref 30.0–36.0)
MCV: 100.5 fL — ABNORMAL HIGH (ref 80.0–100.0)
MCV: 95.4 fL (ref 80.0–100.0)
Platelets: 235 10*3/uL (ref 150–400)
Platelets: 289 10*3/uL (ref 150–400)
RBC: 2.18 MIL/uL — ABNORMAL LOW (ref 3.87–5.11)
RBC: 3.89 MIL/uL (ref 3.87–5.11)
RDW: 12.9 % (ref 11.5–15.5)
RDW: 14.7 % (ref 11.5–15.5)
WBC: 20.4 10*3/uL — ABNORMAL HIGH (ref 4.0–10.5)
WBC: 16.9 10*3/uL — ABNORMAL HIGH (ref 4.0–10.5)
nRBC: 0.1 % (ref 0.0–0.2)
nRBC: 0.3 % — ABNORMAL HIGH (ref 0.0–0.2)

## 2020-03-12 LAB — POCT I-STAT 7, (LYTES, BLD GAS, ICA,H+H)
Acid-base deficit: 4 mmol/L — ABNORMAL HIGH (ref 0.0–2.0)
Bicarbonate: 22.8 mmol/L (ref 20.0–28.0)
Calcium, Ion: 1.07 mmol/L — ABNORMAL LOW (ref 1.15–1.40)
HCT: 34 % — ABNORMAL LOW (ref 36.0–46.0)
Hemoglobin: 11.6 g/dL — ABNORMAL LOW (ref 12.0–15.0)
O2 Saturation: 99 %
Patient temperature: 35.8
Potassium: 4.1 mmol/L (ref 3.5–5.1)
Sodium: 134 mmol/L — ABNORMAL LOW (ref 135–145)
TCO2: 24 mmol/L (ref 22–32)
pCO2 arterial: 43.3 mmHg (ref 32.0–48.0)
pH, Arterial: 7.324 — ABNORMAL LOW (ref 7.350–7.450)
pO2, Arterial: 122 mmHg — ABNORMAL HIGH (ref 83.0–108.0)

## 2020-03-12 LAB — GLUCOSE, CAPILLARY
Glucose-Capillary: 104 mg/dL — ABNORMAL HIGH (ref 70–99)
Glucose-Capillary: 127 mg/dL — ABNORMAL HIGH (ref 70–99)
Glucose-Capillary: 130 mg/dL — ABNORMAL HIGH (ref 70–99)
Glucose-Capillary: 133 mg/dL — ABNORMAL HIGH (ref 70–99)
Glucose-Capillary: 142 mg/dL — ABNORMAL HIGH (ref 70–99)
Glucose-Capillary: 144 mg/dL — ABNORMAL HIGH (ref 70–99)
Glucose-Capillary: 89 mg/dL (ref 70–99)

## 2020-03-12 LAB — BLOOD GAS, ARTERIAL
Acid-base deficit: 4.8 mmol/L — ABNORMAL HIGH (ref 0.0–2.0)
Bicarbonate: 21.7 mmol/L (ref 20.0–28.0)
FIO2: 40
O2 Saturation: 94.4 %
Patient temperature: 98.6
pCO2 arterial: 48 mmHg (ref 32.0–48.0)
pH, Arterial: 7.277 — ABNORMAL LOW (ref 7.350–7.450)
pO2, Arterial: 75.3 mmHg — ABNORMAL LOW (ref 83.0–108.0)

## 2020-03-12 LAB — HEPARIN LEVEL (UNFRACTIONATED): Heparin Unfractionated: 0.44 IU/mL (ref 0.30–0.70)

## 2020-03-12 LAB — PHOSPHORUS: Phosphorus: 5.3 mg/dL — ABNORMAL HIGH (ref 2.5–4.6)

## 2020-03-12 LAB — MAGNESIUM: Magnesium: 2.5 mg/dL — ABNORMAL HIGH (ref 1.7–2.4)

## 2020-03-12 LAB — PREPARE RBC (CROSSMATCH)

## 2020-03-12 SURGERY — LAPAROTOMY, EXPLORATORY
Anesthesia: General

## 2020-03-12 MED ORDER — KETAMINE HCL 10 MG/ML IJ SOLN
INTRAMUSCULAR | Status: DC | PRN
  Administered 2020-03-12 (×2): 30 mg via INTRAVENOUS

## 2020-03-12 MED ORDER — ROCURONIUM BROMIDE 10 MG/ML (PF) SYRINGE
PREFILLED_SYRINGE | INTRAVENOUS | Status: AC
  Filled 2020-03-12: qty 10

## 2020-03-12 MED ORDER — IOHEXOL 9 MG/ML PO SOLN
500.0000 mL | ORAL | Status: AC
  Administered 2020-03-12 (×2): 500 mL via ORAL

## 2020-03-12 MED ORDER — ALBUMIN HUMAN 5 % IV SOLN: INTRAVENOUS | Status: DC | PRN

## 2020-03-12 MED ORDER — KETAMINE HCL 10 MG/ML IJ SOLN
INTRAMUSCULAR | Status: AC
  Filled 2020-03-12: qty 1

## 2020-03-12 MED ORDER — PHENYLEPHRINE 40 MCG/ML (10ML) SYRINGE FOR IV PUSH (FOR BLOOD PRESSURE SUPPORT)
PREFILLED_SYRINGE | INTRAVENOUS | Status: AC
  Filled 2020-03-12: qty 10

## 2020-03-12 MED ORDER — SODIUM CHLORIDE 0.9 % IR SOLN
Status: DC | PRN
  Administered 2020-03-12 (×2): 3000 mL

## 2020-03-12 MED ORDER — ALBUMIN HUMAN 25 % IV SOLN
25.0000 g | INTRAVENOUS | Status: AC
  Administered 2020-03-12 (×2): 25 g via INTRAVENOUS
  Filled 2020-03-12: qty 100
  Filled 2020-03-12 (×2): qty 50

## 2020-03-12 MED ORDER — ROCURONIUM BROMIDE 10 MG/ML (PF) SYRINGE
PREFILLED_SYRINGE | INTRAVENOUS | Status: DC | PRN
  Administered 2020-03-12 (×2): 30 mg via INTRAVENOUS

## 2020-03-12 MED ORDER — PHENYLEPHRINE 40 MCG/ML (10ML) SYRINGE FOR IV PUSH (FOR BLOOD PRESSURE SUPPORT)
PREFILLED_SYRINGE | INTRAVENOUS | Status: DC | PRN
  Administered 2020-03-12 (×2): 120 ug via INTRAVENOUS

## 2020-03-12 MED ORDER — FENTANYL CITRATE (PF) 100 MCG/2ML IJ SOLN
INTRAMUSCULAR | Status: AC
  Filled 2020-03-12: qty 2

## 2020-03-12 MED ORDER — SODIUM CHLORIDE 0.9 % IV SOLN: INTRAVENOUS | Status: DC | PRN

## 2020-03-12 MED ORDER — LACTATED RINGERS IV SOLN: INTRAVENOUS | Status: DC | PRN

## 2020-03-12 MED ORDER — EPHEDRINE 5 MG/ML INJ
INTRAVENOUS | Status: AC
  Filled 2020-03-12: qty 10

## 2020-03-12 MED ORDER — IOHEXOL 9 MG/ML PO SOLN: 500.0000 mL | ORAL | Status: DC

## 2020-03-12 MED ORDER — 0.9 % SODIUM CHLORIDE (POUR BTL) OPTIME
TOPICAL | Status: DC | PRN
  Administered 2020-03-12: 1000 mL

## 2020-03-12 MED ORDER — SODIUM CHLORIDE 0.9% IV SOLUTION: Freq: Once | INTRAVENOUS | Status: AC

## 2020-03-12 SURGICAL SUPPLY — 51 items
APL SWBSTK 6 STRL LF DISP (MISCELLANEOUS) ×1
APPLICATOR COTTON TIP 6 STRL (MISCELLANEOUS) ×1 IMPLANT
APPLICATOR COTTON TIP 6IN STRL (MISCELLANEOUS) ×2
BARRIER SKIN 2 3/4 (OSTOMY) ×2 IMPLANT
BINDER ABDOMINAL 12 ML 46-62 (SOFTGOODS) ×2 IMPLANT
BLADE EXTENDED COATED 6.5IN (ELECTRODE) IMPLANT
BLADE HEX COATED 2.75 (ELECTRODE) ×2 IMPLANT
BNDG GAUZE ELAST 4 BULKY (GAUZE/BANDAGES/DRESSINGS) ×1 IMPLANT
BRR SKN FLT 2.75X2.25 2 PC (OSTOMY) ×1
COVER MAYO STAND STRL (DRAPES) ×2 IMPLANT
COVER SURGICAL LIGHT HANDLE (MISCELLANEOUS) ×2 IMPLANT
COVER WAND RF STERILE (DRAPES) IMPLANT
DRAIN CHANNEL 19F RND (DRAIN) IMPLANT
DRAPE LAPAROSCOPIC ABDOMINAL (DRAPES) ×2 IMPLANT
DRAPE WARM FLUID 44X44 (DRAPES) ×2 IMPLANT
DRSG PAD ABDOMINAL 8X10 ST (GAUZE/BANDAGES/DRESSINGS) ×2 IMPLANT
ELECT REM PT RETURN 15FT ADLT (MISCELLANEOUS) ×2 IMPLANT
EVACUATOR SILICONE 100CC (DRAIN) IMPLANT
GAUZE SPONGE 4X4 12PLY STRL (GAUZE/BANDAGES/DRESSINGS) ×2 IMPLANT
GLOVE BIOGEL M 8.0 STRL (GLOVE) ×2 IMPLANT
GOWN SPEC L4 XLG W/TWL (GOWN DISPOSABLE) ×2 IMPLANT
GOWN STRL REUS W/TWL XL LVL3 (GOWN DISPOSABLE) ×6 IMPLANT
HANDLE STAPLE EGIA 4 XL (STAPLE) ×1 IMPLANT
HANDLE SUCTION POOLE (INSTRUMENTS) IMPLANT
KIT BASIN OR (CUSTOM PROCEDURE TRAY) ×2 IMPLANT
KIT TURNOVER KIT A (KITS) IMPLANT
LIGASURE IMPACT 36 18CM CVD LR (INSTRUMENTS) ×1 IMPLANT
NS IRRIG 1000ML POUR BTL (IV SOLUTION) ×4 IMPLANT
PACK GENERAL/GYN (CUSTOM PROCEDURE TRAY) ×2 IMPLANT
PENCIL SMOKE EVACUATOR (MISCELLANEOUS) IMPLANT
RELOAD TRI 60 ART MED THCK PUR (STAPLE) ×1 IMPLANT
SPONGE DRAIN TRACH 4X4 STRL 2S (GAUZE/BANDAGES/DRESSINGS) ×1 IMPLANT
SPONGE LAP 18X18 RF (DISPOSABLE) ×4 IMPLANT
STAPLER VISISTAT 35W (STAPLE) ×2 IMPLANT
SUCTION POOLE HANDLE (INSTRUMENTS)
SUT CHROMIC 3 0 SH 27 (SUTURE) ×2 IMPLANT
SUT PDS AB 1 CTX 36 (SUTURE) IMPLANT
SUT PDS AB 1 TP1 96 (SUTURE) ×4 IMPLANT
SUT SILK 2 0 (SUTURE) ×2
SUT SILK 2 0 SH CR/8 (SUTURE) ×2 IMPLANT
SUT SILK 2-0 18XBRD TIE 12 (SUTURE) ×1 IMPLANT
SUT SILK 3 0 (SUTURE) ×2
SUT SILK 3 0 SH CR/8 (SUTURE) ×2 IMPLANT
SUT SILK 3-0 18XBRD TIE 12 (SUTURE) ×1 IMPLANT
SUT VIC AB 3-0 SH 18 (SUTURE) IMPLANT
SUT VICRYL 2 0 18  UND BR (SUTURE)
SUT VICRYL 2 0 18 UND BR (SUTURE) IMPLANT
TAPE CLOTH SURG 4X10 WHT LF (GAUZE/BANDAGES/DRESSINGS) ×1 IMPLANT
TOWEL OR 17X26 10 PK STRL BLUE (TOWEL DISPOSABLE) ×2 IMPLANT
TOWEL OR NON WOVEN STRL DISP B (DISPOSABLE) ×2 IMPLANT
TRAY FOLEY MTR SLVR 16FR STAT (SET/KITS/TRAYS/PACK) ×2 IMPLANT

## 2020-03-12 NOTE — Anesthesia Preprocedure Evaluation (Addendum)
Anesthesia Evaluation  Patient identified by MRN, date of birth, ID band Patient awake    Reviewed: Allergy & Precautions, NPO status , Patient's Chart, lab work & pertinent test results  Airway Mallampati: Intubated       Dental no notable dental hx.    Pulmonary asthma , COPD,  COPD inhaler, Patient abstained from smoking., former smoker,    Pulmonary exam normal breath sounds clear to auscultation       Cardiovascular negative cardio ROS Normal cardiovascular exam Rhythm:Regular Rate:Normal     Neuro/Psych negative neurological ROS  negative psych ROS   GI/Hepatic Neg liver ROS, Perforated sigmoid diverticulum   Endo/Other  negative endocrine ROS  Renal/GU negative Renal ROS  negative genitourinary   Musculoskeletal   Abdominal   Peds  Hematology   Anesthesia Other Findings   Reproductive/Obstetrics                            Anesthesia Physical Anesthesia Plan  ASA: IV and emergent  Anesthesia Plan: General   Post-op Pain Management:    Induction: Intravenous and Inhalational  PONV Risk Score and Plan: 4 or greater and Ondansetron and Treatment may vary due to age or medical condition  Airway Management Planned: Oral ETT  Additional Equipment:   Intra-op Plan:   Post-operative Plan: Post-operative intubation/ventilation  Informed Consent: I have reviewed the patients History and Physical, chart, labs and discussed the procedure including the risks, benefits and alternatives for the proposed anesthesia with the patient or authorized representative who has indicated his/her understanding and acceptance.    Suspend DNR.     Plan Discussed with: CRNA and Surgeon  Anesthesia Plan Comments:        Anesthesia Quick Evaluation

## 2020-03-12 NOTE — Anesthesia Procedure Notes (Signed)
Arterial Line Insertion Start/End3/30/2021 3:00 PM Performed by: Josephine Igo, MD, Gerald Leitz, CRNA, CRNA  Patient location: OR. Preanesthetic checklist: patient identified, IV checked, site marked, risks and benefits discussed, surgical consent, monitors and equipment checked, pre-op evaluation, timeout performed and anesthesia consent Right, radial was placed Catheter size: 20 G Hand hygiene performed , maximum sterile barriers used  and Seldinger technique used Allen's test indicative of satisfactory collateral circulation Attempts: 1 Procedure performed without using ultrasound guided technique. Following insertion, dressing applied and Biopatch. Post procedure assessment: normal and unchanged  Patient tolerated the procedure well with no immediate complications.

## 2020-03-12 NOTE — Op Note (Signed)
Tracy [name]  05-12-1951 12 March 2020    PCP:  No primary care provider on file.   Surgeon: Kaylyn Lim, MD, FACS  Asst:  Romana Juniper, MD, FACS; Brigid Re, PA  Anes:  general  Preop Dx: Perforated transverse and descending colon Postop Dx: Same with feculent peritonitis  Procedure: Laparotomy with takedown of colostomy, left colectomy, peritoneal irrigation and debridement; creation of new transverse end colostomy Location Surgery: [facility] 2 Complications: none  EBL:   60 cc  Drains: #19 blake drain in the pelvis  Description of Procedure:  The patient was taken to OR 2 .  After anesthesia was administered and the patient was prepped  with betadine and a timeout was performed.  The midline incision was opened and the abdomen entered.  Feces surrounded the ostomy which was dead.  The colon was mobilized to the splenic flexure.  This area was dead and perforated.  It was resected and transected at a viable point in the transverse colon.  Pulse lavage was used for peritoneal debridement.  The left ureter was identified and was intact.  The drain was replaced in the pelvis.  Sponge and needle counts were correct.  The abdomen was closed with running double stranded PDS and packed with saline soaked gauze.  The colostomy was matured with two sutures of 3-0 chromic in a running and locking fashion.  Ostomy appliance applied.   The patient tolerated the procedure well and was taken to the ICU in stable condition.     Matt B. Hassell Done, [place], [facility], [place]

## 2020-03-12 NOTE — Progress Notes (Addendum)
ANTICOAGULATION CONSULT NOTE - Follow Up Consult  Pharmacy Consult for Heparin Indication: pulmonary embolus  Allergies  Allergen Reactions  . Codeine Swelling    Patient Measurements: Height: 5\' 2"  (157.5 cm) Weight: 163 lb 5.8 oz (74.1 kg) IBW/kg (Calculated) : 50.1 Heparin Dosing Weight: 66 kg  Vital Signs: Temp: 98.6 F (37 C) (03/30 0600) BP: 144/61 (03/30 0600) Pulse Rate: 90 (03/30 0600)  Labs: Recent Labs    03/09/20 0907 03/09/20 1307 03/09/20 1307 03/09/20 1752 03/09/20 1752 03/10/20 0549 03/10/20 0549 03/10/20 1721 03/11/20 0500 03/11/20 0500 03/11/20 1057 03/11/20 2001 03/11/20 2231 03/12/20 0433 03/12/20 0653  HGB  --  12.5   < > 11.5*   < > 10.9*   < >  --   --   --   --   --  7.1* 7.0*  --   HCT  --  39.3   < > 36.3   < > 34.0*  --   --   --   --   --   --  22.3* 21.9*  --   PLT  --  336   < > 385   < > 399  --   --   --   --   --   --  249 235  --   LABPROT  --   --   --  16.2*  --   --   --   --   --   --   --   --   --   --   --   INR  --   --   --  1.3*  --   --   --   --   --   --   --   --   --   --   --   HEPARINUNFRC  --   --   --   --   --   --   --    < > 0.37   < > <0.10* 0.14*  --   --  0.44  CREATININE  --  1.60*   < > 1.74*   < > 2.22*  --   --  2.93*  --   --   --   --  2.81*  --   TROPONINIHS 4 5  --  4  --   --   --   --   --   --   --   --   --   --   --    < > = values in this interval not displayed.    Estimated Creatinine Clearance: 18.6 mL/min (A) (by C-G formula based on SCr of 2.81 mg/dL (H)).   Medications:  Infusions:  . sodium chloride    . fentaNYL infusion INTRAVENOUS 75 mcg/hr (03/12/20 0500)  . heparin 950 Units/hr (03/12/20 0500)  . meropenem (MERREM) IV Stopped (03/11/20 2018)  . norepinephrine (LEVOPHED) Adult infusion Stopped (03/11/20 0830)    Assessment: 69 yo female s/p emergent low anterior resection/colostomy on 3/21 due to perforated sigmoid colon. Pt transferred to ICU on 3/26 with SOB/tachypnea  and heparin drip was empirically started for r/o PE. Pt received contrast on 3/21 and again on 3/26 for CT abd/pelvis and was unable to receive more contrast at that time. IV UFH was transitioned back to LMWH for DVT ppx on 3/27.   Patient currently intubated, pharmacy consulted on 3/28 to restart IV UFH for pulmonary embolism. Progress notes from 3/27  documenting blood in colostomy, possible hematoma. Confirmed with surgery that it is ok to initiate heparin with bolus for PE at this time.  Today, 03/12/2020  Heparin level 0.44, therapeutic on Heparin 950 units/hr.  RN reports continued blood in ostomy ([facility] aware).  No new bleeding or complications.  CT scan pending, OK to continue heparin drip for now pending results (discussed with [facility]). CBC: Hgb decreased to 7.0, Plt WNL.  Ordered 2 units PRBC transfusion.  Goal of Therapy:  Heparin level 0.3-0.7 units/ml Monitor platelets by anticoagulation protocol: Yes   Plan:  Continue heparin IV infusion at 950 units/hr Heparin level in 8 hours to confirm Daily heparin level and CBC Continue to monitor H&H and platelets   Gretta Arab PharmD, BCPS Pager: [phone] 03/12/2020 7:46 AM   Brief Addendum: CT with intra-abdominal bleed.  Heparin stopped at 1:15 per RN Ashleigh. VQ scan is low probability for PE.  Per PCCM, ok to d/c Heparin per pharmacy consult. F/u postop if/when safe to resume VTE prophylaxis.  Gretta Arab PharmD, BCPS Pager: [phone] 03/12/2020 2:29 PM

## 2020-03-12 NOTE — Progress Notes (Signed)
NAME:  Tracy [name], MRN:  [id number], DOB:  [birth year], LOS: 9 ADMISSION DATE:  03/03/2020, CONSULTATION DATE:  3/27/021 REFERRING MD:  - Dr Ninfa Linden of [facility], CHIEF COMPLAINT:  Post op copd consult   Brief History   Rectal cancer s/p colon perf, repaired 3/21 Septic shock- HAP RUL, intubated 3/27 COPD- GOLD B  History of present illness   69 year old former smoker with gold stage III-4 COPD FEV1 31% with postnasal drip.  Sees Dr. Leory Plowman Icard in the pulmonary office.  At baseline she is on Trelegy, Flonase and Claritin.  She is not on oxygen at baseline but husband reports class III dyspnea even for ADLs.  She presented on March 03, 2020 with bowel perforation and status post lower anterior resection and colostomy for newly diagnosed proximal rectal cancer associated with distal sigmoid perforation by Dr. Doreene Adas.  There was no obvious metastatic disease on the parietal peritoneum or liver.  Her postoperative course has been complicated by acute kidney injury that was improving from 2.2 mg percent on 03/06/2019 1 to 1.2 mg percent on 03/08/2020 [status post Foley removal 03/06/2020].  During rounds on the morning of 03/08/2020: White blood count is also trending down.  The stoma was felt to be improving she was getting intermittent opioids for pain and also symptomatic nausea treatment.  She also underwent CT abdomen with contrast that showed possible liver lesion along with postop ileus  On the night of March 08, 2020 she developed acute delirium and respiratory distress with desaturation.  Empiric IV heparin was started given concern for pulmonary embolism in the setting of cancer and being bedbound for 5 days [although she was getting Lovenox DVT prophylaxis even as of 03/04/20]. CCM consulted am of 03/09/20 for copd. PAtient found to be in severe resp distress with bilateral faint wheezing and not responding to ccm interventions of fentanyl, nebulizer, bipap, mag and solumedrol  BP low  and fluids responsive  Past Medical History  COPD 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 Allergies Vitamin D deficiency Newly diagnosed colon cancer  Significant Hospital Events   03/03/2020 - admitt - bowel perforation and status post lower anterior resection and colostomy for proximal rectal cancer associated with Distal sigmoid perforation by  Consults:  3/23 -nephro consult 3/27 - ccm consult  Procedures:  3/27 intubation 3/27 CVC  Significant Diagnostic Tests:  3/21 - rectal path> 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  RV volume and pressure overload.  Mildly dilated RA.  Micro Data:  COVID 3/21 >> Negative MRSA PCR 3/21 >> Negative Blood cultures 3/27 >>  Urine culture 3/26 >> insignificant growth   Antimicrobials:  Zosyn 3/21 >>  Interim history/subjective:  RN reports no acute events overnight, she is alert and interactive on vent. 1.3L our from [place] in the last 24hrs, ~1l urine output   Objective   Blood pressure (!) 144/61, pulse 90, temperature 98.6 F (37 C), resp. rate 12, height 5\' 2"  (1.575 m), weight 74.1 kg, last menstrual period 12/14/2000, SpO2 97 %.    Vent Mode: PRVC FiO2 (%):  [40 %] 40 % Set Rate:  [18 bmp] 18 bmp Vt Set:  [400 mL] 400 mL PEEP:  [5 cmH20] 5 cmH20 Plateau Pressure:  [13 cmH20-21 cmH20] 16 cmH20   Intake/Output Summary (Last 24 hours) at 03/12/2020 [id number]  Last data filed at 03/12/2020 [id number] Gross per 24 hour  Intake 1539.2 ml  Output 2760 ml  Net -1220.8 ml   Filed Weights   03/10/20 0500 03/11/20 0446 03/12/20 0449  Weight: 72.8 kg 72.1 kg 74.1 kg    Examination: General: Chronically ill appearing elderly female on mechanical ventilation, in NAD HEENT: ETT, MM pink/moist, PERRL, sclera non-icteric  Neuro: Alert  and interactive on vent, trying to communicate on vent, able to follow some simple commands  CV: s1s2 regular rate and rhythm, no murmur, rubs, or gallops,  PULM:  Clear to ascultation, no increased work of breathing, comfortable on vent  GI: soft, bowel sounds active in all 4 quadrants, non-tender, non-distended Extremities: warm/dry, no edema  Skin: no rashes or lesions  Resolved Hospital Problem list   Shock, improving   Assessment & Plan:   Acute hypoxic respiratory failure  Baseline Gold stage III/IV COPD but not on home oxygen Questionable PE -Unclear etiology of her deterioration, but with echocardiogram demonstrating RV strain and no previous for comparison, there is concern for acute PE despite negative lower extremity Dopplers.  An aspiration event is also possible. HAP and COPD exacerbation possible.  -If PE present, not a candidate for thrombolytics given recent significant abdominal surgery Plan: Continue ventilator support with lung protective strategies  Wean PEEP and FiO2 for sats greater than 90%. Head of bed elevated 30 degrees. Plateau pressures less than 30 cm H20.  Follow intermittent chest x-ray and ABG.   SAT/SBT as tolerated, mentation preclude extubation  Ensure adequate pulmonary hygiene  Follow cultures  VAP bundle in place  PAD protocol Continue steroids and BD Heparin drip  Obtain Vq scan   Acute delirium 03/08/2020 late p.m.  -Secondary to use of opioids, COPD exacerbations and recent surgery Plan:  Minimize sedation  Mentation improved 3/30 Delirium precautions  PAD protocol  RASS goal 0 to -1  Newly diagnosed colon cancer with perforation causing peritonitis, status post resection on 3/21 -WBC continues to remains elevated, concern for abscess development Plan: Continue IV antibiotics, changed to Meropenem 3/29  General surgery primary  JP drain per surgery, continues to have high output  Follow cultures  Trend CBC and fever curve   Obtain non-contrasted CT Repeat albumin dosing today   Acute kidney injury on 03/04/2020.   -Improved and near normalized on 03/08/2020 and status post CT abdomen with dye load and shock.  Now recurrent AKI -Currently +6L Plan: Follow renal function / urine output Trend Bmet Avoid nephrotoxins, ensure adequate renal perfusion  IV hydration Strict I/O  At risk malnutrition Plan: Continue tube feeds  Supplement vitamins and protein  Enteral hydration   Acute anemia -likely due to dilution, blood loss, critical illness -Hgb further dropped to 7.0 3/30 Plan: Transfuse 2 units of PRBC  Transfuse per protocol  Hemoglobin goal greater than 7 Trend CBC   Hyperglycemia -no previous known history of diabetes Plan: SSI  CBG checks q4hrs   Best practice:  Diet: TF Pain/Anxiety/Delirium protocol (if indicated): PAD protocol VAP protocol (if indicated): HOB > 30 DVT prophylaxis: heparin drip GI prophylaxis: ppi Glucose control: ssi Mobility: bed rest Code Status: full code Family Communication: husband updated at bedside 3/30 Disposition: ICU  LABS    PULMONARY Recent Labs  Lab 03/08/20 2240 03/09/20 0545 03/09/20 1005  PHART 7.380 7.305* 7.330*  PCO2ART 42.0 47.1 46.1  PO2ART 67.6* 63.3* 260*  HCO3 24.4 23.0 23.9  O2SAT 92.2 90.2 99.3    CBC Recent Labs  Lab 03/10/20 0549 03/11/20 2231 03/12/20 0433  HGB 10.9* 7.1* 7.0*  HCT 34.0* 22.3* 21.9*  WBC 36.6* 21.9* 20.4*  PLT 399 249 235    COAGULATION Recent Labs  Lab 03/09/20 1752  INR 1.3*    CARDIAC  No results for input(s): TROPONINI in the last 168 hours. No results for input(s): PROBNP in the last 168 hours.   CHEMISTRY Recent Labs  Lab 03/05/20 1446 03/06/20 0236 03/09/20 1307 03/09/20 1307 03/09/20 1752 03/09/20 1752 03/10/20 0549 03/10/20 0549 03/11/20 0500 03/12/20 0433  NA 136   < > 134*  --  136  --  134*  --  136 137  K 3.8   < > 4.3   < > 4.6   < > 4.2   < > 4.3 3.9  CL  102   < > 99  --  100  --  98  --  99 101  CO2 21*   < > 23  --  22  --  23  --  24 25  GLUCOSE 241*   < > 144*  --  195*  --  197*  --  177* 133*  BUN 45*   < > 38*  --  42*  --  49*  --  72* 86*  CREATININE 2.29*   < > 1.60*  --  1.74*  --  2.22*  --  2.93* 2.81*  CALCIUM 7.9*   < > 7.3*  --  7.2*  --  7.6*  --  7.8* 7.8*  MG  --   --   --   --   --   --  2.3  --  2.5* 2.5*  PHOS 4.7*  --   --   --   --   --  4.3  --  5.0* 5.3*   < > = values in this interval not displayed.   Estimated Creatinine Clearance: 18.6 mL/min (A) (by C-G formula based on SCr of 2.81 mg/dL (H)).   LIVER Recent Labs  Lab 03/05/20 1446 03/09/20 1307 03/09/20 1752 03/10/20 0549  AST  --  429* 440* 282*  ALT  --  131* 152* 148*  ALKPHOS  --  61 57 63  BILITOT  --  1.3* 1.0 0.8  PROT  --  4.4* 4.2* 4.7*  ALBUMIN 2.4* 1.6* 1.8* 1.8*  INR  --   --  1.3*  --      INFECTIOUS Recent Labs  Lab 03/09/20 1307 03/09/20 1749 03/09/20 2343 03/10/20 0549 03/11/20 0500 03/11/20 1057  LATICACIDVEN 2.3*   < > 2.0* 2.0*  --  1.3  PROCALCITON 34.99  --   --  27.18 49.27  --    < > = values in this interval not displayed.     ENDOCRINE CBG (last 3)  Recent Labs    03/11/20 1959 03/12/20 0017 03/12/20 0439  GLUCAP 106* 89 130*    CRITICAL CARE Performed by: Johnsie Cancel  Total critical care time: 38 minutes  Critical care time was exclusive of separately billable procedures and treating other patients.  Critical care was necessary to treat or prevent imminent or life-threatening deterioration.  Critical care was time spent personally by me on the following activities: development of treatment plan with patient and/or surrogate as well as nursing, discussions with consultants, evaluation of patient's response to treatment, examination of patient, obtaining history from patient or surrogate, ordering and performing treatments and interventions, ordering and review of laboratory studies, ordering  and review of radiographic studies, pulse oximetry and re-evaluation of patient's condition.  Johnsie Cancel, NP-C [facility] Contact / Pager information can be found on Amion  03/12/2020, 8:32 AM

## 2020-03-12 NOTE — Progress Notes (Signed)
[facility] Progress Note  9 Days Post-Op  Subjective: Patient has remained off pressors since yesterday AM. Awake on the vent this AM. Still having blood from stoma and stoma has not sloughed top layer completely yet. UOP increased overnight. Drain still high output but decreased from yesterday. Responded well to albumin yesterday.   Objective: Vital signs in last 24 hours: Temp:  [98.6 F (37 C)-100.4 F (38 C)] 98.6 F (37 C) (03/30 0600) Pulse Rate:  [90-113] 90 (03/30 0600) Resp:  [8-24] 12 (03/30 0600) BP: (94-144)/(47-72) 144/61 (03/30 0600) SpO2:  [95 %-100 %] 97 % (03/30 0327) FiO2 (%):  [40 %] 40 % (03/30 0327) Weight:  [74.1 kg] 74.1 kg (03/30 0449) Last BM Date: 03/10/20  Intake/Output from previous day: 03/29 0701 - 03/30 0700 In: 1686.7 [I.V.:656.3; NG/GT:511.3; IV Piggyback:419.2] Out: 2960 [Urine:965; Drains:1345; Stool:650] Intake/Output this shift: No intake/output data recorded.  PE: General: on the vent, opens eyes and trying to talk this AM Heart: sinus tachycardia, edematous all over Lungs:diminished bilaterally, vent Abd: soft, NT, ND, +BS,stoma necrotic and sloughing, frank blood in ostomy appliance, fascial defect for ostomy feels tight but I'm able to digitize stoma, midline wound clean, drain in RLQ with yellow clear fluid   Lab Results:  Recent Labs    03/11/20 2231 03/12/20 0433  WBC 21.9* 20.4*  HGB 7.1* 7.0*  HCT 22.3* 21.9*  PLT 249 235   BMET Recent Labs    03/11/20 0500 03/12/20 0433  NA 136 137  K 4.3 3.9  CL 99 101  CO2 24 25  GLUCOSE 177* 133*  BUN 72* 86*  CREATININE 2.93* 2.81*  CALCIUM 7.8* 7.8*   PT/INR Recent Labs    03/09/20 1752  LABPROT 16.2*  INR 1.3*   CMP     Component Value Date/Time   NA 137 03/12/2020 0433   K 3.9 03/12/2020 0433   CL 101 03/12/2020 0433   CO2 25 03/12/2020 0433   GLUCOSE 133 (H) 03/12/2020 0433   BUN 86 (H) 03/12/2020 0433   CREATININE 2.81 (H) 03/12/2020 0433    CALCIUM 7.8 (L) 03/12/2020 0433   PROT 4.7 (L) 03/10/2020 0549   ALBUMIN 1.8 (L) 03/10/2020 0549   AST 282 (H) 03/10/2020 0549   ALT 148 (H) 03/10/2020 0549   ALKPHOS 63 03/10/2020 0549   BILITOT 0.8 03/10/2020 0549   GFRNONAA 17 (L) 03/12/2020 0433   GFRAA 19 (L) 03/12/2020 0433   Lipase     Component Value Date/Time   LIPASE 18 03/03/2020 1214       Studies/Results: No results found.  Anti-infectives: Anti-infectives (From admission, onward)   Start     Dose/Rate Route Frequency Ordered Stop   03/11/20 2000  meropenem (MERREM) 500 mg in sodium chloride 0.9 % 100 mL IVPB     500 mg 200 mL/hr over 30 Minutes Intravenous Every 12 hours 03/11/20 1658     03/11/20 1200  piperacillin-tazobactam (ZOSYN) IVPB 2.25 g  Status:  Discontinued     2.25 g 100 mL/hr over 30 Minutes Intravenous Every 6 hours 03/11/20 1058 03/11/20 1641   03/06/20 1200  piperacillin-tazobactam (ZOSYN) IVPB 3.375 g  Status:  Discontinued     3.375 g 12.5 mL/hr over 240 Minutes Intravenous Every 8 hours 03/06/20 0739 03/11/20 1058   03/05/20 1400  piperacillin-tazobactam (ZOSYN) IVPB 2.25 g  Status:  Discontinued     2.25 g 100 mL/hr over 30 Minutes Intravenous Every 8 hours 03/05/20 0752 03/06/20 0739  03/04/20 2200  piperacillin-tazobactam (ZOSYN) IVPB 3.375 g  Status:  Discontinued     3.375 g 12.5 mL/hr over 240 Minutes Intravenous Every 8 hours 03/03/20 2032 03/05/20 0752   03/03/20 1545  cefoTEtan (CEFOTAN) 2 g in sodium chloride 0.9 % 100 mL IVPB     2 g 200 mL/hr over 30 Minutes Intravenous On call to O.R. 03/03/20 1532 03/04/20 0444   03/03/20 1345  piperacillin-tazobactam (ZOSYN) IVPB 3.375 g     3.375 g 12.5 mL/hr over 240 Minutes Intravenous  Once 03/03/20 1330 03/03/20 1507       Assessment/Plan COPD VDRF - weaning, keep intubated today until CT A/P back, appreciate CCM assistance, havent been able to get CTA to look for PE AKI - Cr2.81 from 2.93 yesterday, UOP increased  overnight ABL anemia - GI bleeding and also consumptive, hgb 7.0 this AM, transfuse 2 units   Perforated sigmoid colon Proximal rectal cancer with obstruction S/p emergent Low anterior resection/ colostomy by Dr. Marcello Moores 3/21 - POD#9 - continue daily dressing changes - WOC following for ostomy teaching - stomais necrotic and sloughing, fascial opening feels tight but will continue to monitor closely, may need revision  - WBCdown to 20 today, IV abx switched to merrem 3/29 - non-contrasted CT today  - drain with1.3 L out in last 24 hrs, clear yellow fluid, drain Cr yesterday not suggestive of urine leak - TF on hold   FEN -NPO, IVF; hold TFs VTE - SCDs, heparin gtt ID - Zosyn3/21>3/29; Merrem 3/29>> Foley - in place Follow up - Dr. Marcello Moores  LOS: 9 days    Brigid Re , [facility] 03/12/2020, 7:47 AM Please see Amion for pager number during day hours 7:00am-4:30pm

## 2020-03-12 NOTE — Anesthesia Postprocedure Evaluation (Signed)
Anesthesia Post Note  Patient: Tracy [name]  Procedure(s) Performed: EXPLORATORY LAPAROTOMY WITH BOWEL RESECTION AND COLOSTOMY (N/A )     Patient location during evaluation: PACU Anesthesia Type: General Level of consciousness: patient remains intubated per anesthesia plan Pain management: pain level controlled Vital Signs Assessment: post-procedure vital signs reviewed and stable Respiratory status: patient on ventilator - see flowsheet for VS and patient remains intubated per anesthesia plan Cardiovascular status: blood pressure returned to baseline and stable Postop Assessment: no apparent nausea or vomiting Anesthetic complications: no    Last Vitals:  Vitals:   03/12/20 1308 03/12/20 1400  BP: (!) 152/69 (!) 173/69  Pulse: 85 84  Resp: (!) 9 (!) 9  Temp: 36.5 C 36.7 C  SpO2: 95% 95%    Last Pain:  Vitals:   03/12/20 1308  TempSrc: Axillary  PainSc:                  Nakyia Dau A.

## 2020-03-12 NOTE — Transfer of Care (Signed)
Immediate Anesthesia Transfer of Care Note  Patient: Tracy [name]  Procedure(s) Performed: Procedure(s): EXPLORATORY LAPAROTOMY WITH BOWEL RESECTION AND COLOSTOMY (N/A)  Patient Location: ICU  Anesthesia Type:General  Level of Consciousness: Alert, Awake, Oriented  Airway & Oxygen Therapy: Patient Spontanous Breathing  Post-op Assessment: Report given to RN  Post vital signs: Reviewed and stable  Last Vitals:  Vitals:   03/12/20 1308 03/12/20 1400  BP: (!) 152/69 (!) 173/69  Pulse: 85 84  Resp: (!) 9 (!) 9  Temp: 36.5 C 36.7 C  SpO2: [id number]    Complications: No apparent anesthesia complications

## 2020-03-13 ENCOUNTER — Ambulatory Visit: Payer: Medicare HMO | Admitting: Family Medicine

## 2020-03-13 ENCOUNTER — Encounter (HOSPITAL_COMMUNITY): Payer: Self-pay

## 2020-03-13 ENCOUNTER — Encounter: Payer: Self-pay | Admitting: *Deleted

## 2020-03-13 LAB — TYPE AND SCREEN
ABO/RH(D): A POS
Antibody Screen: NEGATIVE
Unit division: 0
Unit division: 0

## 2020-03-13 LAB — CBC
HCT: 40.5 % (ref 36.0–46.0)
HCT: 30.8 % — ABNORMAL LOW (ref 36.0–46.0)
Hemoglobin: 13.2 g/dL (ref 12.0–15.0)
Hemoglobin: 10.1 g/dL — ABNORMAL LOW (ref 12.0–15.0)
MCH: 30.8 pg (ref 26.0–34.0)
MCH: 30.7 pg (ref 26.0–34.0)
MCHC: 32.6 g/dL (ref 30.0–36.0)
MCHC: 32.8 g/dL (ref 30.0–36.0)
MCV: 94.4 fL (ref 80.0–100.0)
MCV: 93.6 fL (ref 80.0–100.0)
Platelets: 325 10*3/uL (ref 150–400)
Platelets: 240 10*3/uL (ref 150–400)
RBC: 4.29 MIL/uL (ref 3.87–5.11)
RBC: 3.29 MIL/uL — ABNORMAL LOW (ref 3.87–5.11)
RDW: 16.2 % — ABNORMAL HIGH (ref 11.5–15.5)
RDW: 15.9 % — ABNORMAL HIGH (ref 11.5–15.5)
WBC: 32.7 10*3/uL — ABNORMAL HIGH (ref 4.0–10.5)
WBC: 26.7 10*3/uL — ABNORMAL HIGH (ref 4.0–10.5)
nRBC: 1.1 % — ABNORMAL HIGH (ref 0.0–0.2)
nRBC: 0.5 % — ABNORMAL HIGH (ref 0.0–0.2)

## 2020-03-13 LAB — BASIC METABOLIC PANEL
Anion gap: 9 (ref 5–15)
BUN: 82 mg/dL — ABNORMAL HIGH (ref 8–23)
CO2: 20 mmol/L — ABNORMAL LOW (ref 22–32)
Calcium: 7 mg/dL — ABNORMAL LOW (ref 8.9–10.3)
Chloride: 110 mmol/L (ref 98–111)
Creatinine, Ser: 2.53 mg/dL — ABNORMAL HIGH (ref 0.44–1.00)
GFR calc Af Amer: 22 mL/min — ABNORMAL LOW (ref >60)
GFR calc non Af Amer: 19 mL/min — ABNORMAL LOW (ref >60)
Glucose, Bld: 81 mg/dL (ref 70–99)
Potassium: 3.9 mmol/L (ref 3.5–5.1)
Sodium: 139 mmol/L (ref 135–145)

## 2020-03-13 LAB — BPAM RBC
Blood Product Expiration Date: 202104042359
Blood Product Expiration Date: 202104042359
ISSUE DATE / TIME: 202103301017
ISSUE DATE / TIME: 202103301244
Unit Type and Rh: 6200
Unit Type and Rh: 6200

## 2020-03-13 LAB — GLUCOSE, CAPILLARY
Glucose-Capillary: 90 mg/dL (ref 70–99)
Glucose-Capillary: 69 mg/dL — ABNORMAL LOW (ref 70–99)
Glucose-Capillary: 116 mg/dL — ABNORMAL HIGH (ref 70–99)
Glucose-Capillary: 115 mg/dL — ABNORMAL HIGH (ref 70–99)
Glucose-Capillary: 121 mg/dL — ABNORMAL HIGH (ref 70–99)
Glucose-Capillary: 121 mg/dL — ABNORMAL HIGH (ref 70–99)

## 2020-03-13 LAB — PHOSPHORUS: Phosphorus: 5.1 mg/dL — ABNORMAL HIGH (ref 2.5–4.6)

## 2020-03-13 LAB — MAGNESIUM: Magnesium: 2.2 mg/dL (ref 1.7–2.4)

## 2020-03-13 LAB — PREALBUMIN: Prealbumin: 7.6 mg/dL — ABNORMAL LOW (ref 18–38)

## 2020-03-13 MED ORDER — IPRATROPIUM-ALBUTEROL 0.5-2.5 (3) MG/3ML IN SOLN
3.0000 mL | Freq: Once | RESPIRATORY_TRACT | Status: AC
  Administered 2020-03-13: 3 mL via RESPIRATORY_TRACT

## 2020-03-13 MED ORDER — PHENYLEPHRINE CONCENTRATED 100MG/250ML (0.4 MG/ML) INFUSION SIMPLE
0.0000 ug/min | INTRAVENOUS | Status: DC
  Filled 2020-03-13: qty 250

## 2020-03-13 MED ORDER — SODIUM CHLORIDE 0.9 % IV SOLN
200.0000 mg | Freq: Once | INTRAVENOUS | Status: AC
  Administered 2020-03-13: 200 mg via INTRAVENOUS
  Filled 2020-03-13: qty 200

## 2020-03-13 MED ORDER — HEPARIN SODIUM (PORCINE) 5000 UNIT/ML IJ SOLN
5000.0000 [IU] | Freq: Three times a day (TID) | INTRAMUSCULAR | Status: DC
  Administered 2020-03-13 – 2020-03-20 (×21): 5000 [IU] via SUBCUTANEOUS
  Filled 2020-03-13 (×20): qty 1

## 2020-03-13 MED ORDER — DEXTROSE 50 % IV SOLN
12.5000 g | INTRAVENOUS | Status: AC
  Administered 2020-03-13: 12.5 g via INTRAVENOUS

## 2020-03-13 MED ORDER — ALBUMIN HUMAN 25 % IV SOLN
25.0000 g | INTRAVENOUS | Status: AC
  Administered 2020-03-13 (×2): 25 g via INTRAVENOUS
  Filled 2020-03-13 (×2): qty 100

## 2020-03-13 MED ORDER — PHENYLEPHRINE HCL-NACL 10-0.9 MG/250ML-% IV SOLN
0.0000 ug/min | INTRAVENOUS | Status: DC
  Administered 2020-03-13: 180 ug/min via INTRAVENOUS
  Administered 2020-03-13: 260 ug/min via INTRAVENOUS
  Administered 2020-03-13: 210 ug/min via INTRAVENOUS
  Administered 2020-03-13: 270 ug/min via INTRAVENOUS
  Administered 2020-03-13: 250 ug/min via INTRAVENOUS
  Administered 2020-03-13 (×2): 270 ug/min via INTRAVENOUS
  Administered 2020-03-13: 180 ug/min via INTRAVENOUS
  Administered 2020-03-13: 20 ug/min via INTRAVENOUS
  Administered 2020-03-13: 210 ug/min via INTRAVENOUS
  Filled 2020-03-13 (×10): qty 250

## 2020-03-13 MED ORDER — ACETAMINOPHEN 325 MG PO TABS
650.0000 mg | ORAL_TABLET | Freq: Four times a day (QID) | ORAL | Status: DC | PRN
  Administered 2020-03-29: 650 mg via ORAL
  Filled 2020-03-13 (×2): qty 2

## 2020-03-13 MED ORDER — SODIUM CHLORIDE 0.9 % IV SOLN
0.0000 ug/min | INTRAVENOUS | Status: DC
  Administered 2020-03-13: 80 ug/min via INTRAVENOUS
  Filled 2020-03-13 (×2): qty 10

## 2020-03-13 MED ORDER — SODIUM CHLORIDE 0.9 % IV SOLN
100.0000 mg | INTRAVENOUS | Status: DC
  Administered 2020-03-14 – 2020-03-17 (×4): 100 mg via INTRAVENOUS
  Filled 2020-03-13 (×5): qty 100

## 2020-03-13 MED ORDER — DEXTROSE 50 % IV SOLN
INTRAVENOUS | Status: AC
  Filled 2020-03-13: qty 50

## 2020-03-13 MED ORDER — ALBUMIN HUMAN 25 % IV SOLN: 12.5000 g | INTRAVENOUS | Status: DC

## 2020-03-13 NOTE — Progress Notes (Signed)
Pharmacy consulted to dose TPN after hours; will compound starting tomorrow if appropriate.  Reuel Boom, PharmD, BCPS [phone] 03/13/2020, 6:46 PM

## 2020-03-13 NOTE — Progress Notes (Signed)
[facility] Progress Note Patient Name: Tracy [name] DOB: [birth year] MRN: [id number]   Date of Service  03/13/2020  HPI/Events of Note  Sinus Tachycardia - HR = 144. Temp = 100.6 F. Norepinephrine IV infusion has been titrated back on to support BP. BP = 87/65 with MAP = 74 by A-line. Suspect that Sinus Tachycardia is driven by fever and Norepinephrine IV infusion.   eICU Interventions  Will order: 1. Control fever.  2. Phenylephrine IV infusion. Titrate to MAP >= 65.  3. Wean Norepinephrine off as tolerated.      Intervention Category Major Interventions: Arrhythmia - evaluation and management  Kaelene Elliston [place] 03/13/2020, 12:45 AM

## 2020-03-13 NOTE — Consult Note (Addendum)
[facility] Nurse ostomy follow up Pt had ostomy revision surgery performed yesterday.  Assessed stoma appearance with surgical PA at the bedside during pouch change.  Stoma type/location: Stoma is red and viable in the center, edges are dark red-black, slightly above skin level, 1 1/2 inches Peristomal assessment: intact skin surrounding  Output: No stool, small amt brown liquid in pouch Ostomy pouching: 2pc.  Education provided:  Applied 2 piece pouch with a barrier ring to attempt to maintain a seal.  Husband at the bedside to watch the procedure and asked appropriate questions. Pt is critically ill and sedated at this time; [facility] team will continue teaching sessions when pt is stable and out of ICU.  Supplies in the room for staff nurses use.  Enrolled patient in [facility] Start Discharge program: Yes, previously Julien Girt MSN, [place], [place], [place], [place]

## 2020-03-13 NOTE — Progress Notes (Signed)
NAME:  Tracy [name], MRN:  [id number], DOB:  02-17-1951, LOS: 10 ADMISSION DATE:  03/03/2020, CONSULTATION DATE:  3/27/021 REFERRING MD:  - Dr Ninfa Linden of [facility], CHIEF COMPLAINT:  Post op copd consult   Brief History   Rectal cancer s/p colon perf, repaired 3/21 Septic shock- HAP RUL, intubated 3/27 COPD- GOLD B  History of present illness   69 year old former smoker with gold stage III-4 COPD FEV1 31% with postnasal drip.  Sees Dr. Leory Plowman Icard in the pulmonary office.  At baseline she is on Trelegy, Flonase and Claritin.  She is not on oxygen at baseline but husband reports class III dyspnea even for ADLs.  She presented on March 03, 2020 with bowel perforation and status post lower anterior resection and colostomy for newly diagnosed proximal rectal cancer associated with distal sigmoid perforation by Dr. Doreene Adas.  There was no obvious metastatic disease on the parietal peritoneum or liver.  Her postoperative course has been complicated by acute kidney injury that was improving from 2.2 mg percent on 03/06/2019 1 to 1.2 mg percent on 03/08/2020 [status post Foley removal 03/06/2020].  During rounds on the morning of 03/08/2020: White blood count is also trending down.  The stoma was felt to be improving she was getting intermittent opioids for pain and also symptomatic nausea treatment.  She also underwent CT abdomen with contrast that showed possible liver lesion along with postop ileus  On the night of March 08, 2020 she developed acute delirium and respiratory distress with desaturation.  Empiric IV heparin was started given concern for pulmonary embolism in the setting of cancer and being bedbound for 5 days [although she was getting Lovenox DVT prophylaxis even as of 03/04/20]. CCM consulted am of 03/09/20 for copd. PAtient found to be in severe resp distress with bilateral faint wheezing and not responding to ccm interventions of fentanyl, nebulizer, bipap, mag and solumedrol  BP low  and fluids responsive  Past Medical History  COPD 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 Allergies Vitamin D deficiency Newly diagnosed colon cancer  Significant Hospital Events   03/03/2020 - admitt - bowel perforation and status post lower anterior resection and colostomy for proximal rectal cancer associated with Distal sigmoid perforation by  3/30 -  Laparotomy with takedown of colostomy, left colectomy, peritoneal irrigation and debridement; creation of new transverse end colostomy  Consults:  3/23 -nephro consult 3/27 - ccm consult  Procedures:  3/27 intubation 3/27 CVC  Significant Diagnostic Tests:  3/21 - rectal path> 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  RV volume and pressure overload.  Mildly dilated RA.  Micro Data:  COVID 3/21 >> Negative MRSA PCR 3/21 >> Negative Blood cultures 3/27 >>  Urine culture 3/26 >> insignificant growth   Antimicrobials:  Zosyn 3/21 >> 3/29 Meropenem >> 3/29  Interim history/subjective:  RN reports patient became tachycardic and febrile overnight. Drain output decreased and urine output slightly increased.  Objective   Blood pressure 114/77, pulse (!) 110, temperature (!) 100.6 F (38.1 C), resp. rate (!) 24, height 5\' 2"  (1.575 m), weight 73.5 kg, last menstrual period 12/14/2000, SpO2 97 %. CVP:  [16 mmHg-18 mmHg] 16 mmHg  Vent Mode: PRVC FiO2 (%):  [30 %-40 %] 40 % Set Rate:  [18 bmp] 18 bmp Vt Set:  [400 mL] 400 mL  PEEP:  [5 cmH20] 5 cmH20 Plateau Pressure:  [7 cmH20-25 cmH20] 18 cmH20   Intake/Output Summary (Last 24 hours) at 03/13/2020 0744 Last data filed at 03/13/2020 [id number] Gross per 24 hour  Intake 5117.38 ml  Output 2260 ml  Net 2857.38 ml   Filed Weights   03/11/20 0446 03/12/20 0449 03/13/20 0500   Weight: 72.1 kg 74.1 kg 73.5 kg    Examination: General: Chronically ill appearing elderly female on mechanical ventilation, in NAD HEENT: ETT, MM pink/moist, PERRL,  Neuro: Alert and able to nod head appropriately to questions CV: Mildly tachycardic, s1s2 regular rate and rhythm, no murmur, rubs, or gallops,  PULM:  Clear to ascultation, barrel chest, non added breath sounds  GI: soft, bowel sounds hypoactive in all 4 quadrants, tender to palpation, slightly distended, surgical dressing clean dry and intact, ostomy site pink Extremities: warm/dry, no edema  Skin: no rashes or lesions  Resolved Hospital Problem list   Shock Questionable PE -VQ scan with low probability   Assessment & Plan:   Acute hypoxic respiratory failure  Baseline Gold stage III/IV COPD but not on home oxygen -Unclear etiology of her deterioration, but with echocardiogram demonstrating RV strain and no previous for comparison, there is concern for acute PE despite negative lower extremity Dopplers.  An aspiration event is also possible. HAP and COPD exacerbation possible.  -If PE present, not a candidate for thrombolytics given recent significant abdominal surgery Plan: Continue ventilator support with lung protective strategies  Wean PEEP and FiO2 for sats greater than 90%. Head of bed elevated 30 degrees. Plateau pressures less than 30 cm H20.  Follow intermittent chest x-ray and ABG.   SAT/SBT as tolerated, mentation preclude extubation  Ensure adequate pulmonary hygiene  Follow cultures  VAP bundle in place  PAD protocol Continue steroids and BD  Acute delirium 03/08/2020 late p.m, Improving  -Secondary to use of opioids, COPD exacerbations and recent surgery Plan:  Minimize sedation but adequately manage pain Delirium precautions  PAD protocol  RASS goal 0 to -1  Newly diagnosed colon cancer with perforation causing peritonitis, status post resection on 3/21 -CT abdomen 3/30 with new free air  this coupled with signs of ostomy nectosis and sloughing patient was take back to OR afternoon of 3/30 for  Laparotomy with takedown of colostomy, left colectomy, peritoneal irrigation and debridement; creation of new transverse end colostomy Plan: Continue IV Meropenem  General surgery primary  Closely monitor JP output  Follow cultures Trend CBC and fever curve   Acute kidney injury on 03/04/2020.   -Improved and near normalized on 03/08/2020 and status post CT abdomen with dye load and shock.  Now recurrent AKI -Currently +6L Plan: Follow renal function / urine output Trend Bmet Avoid nephrotoxins, ensure adequate renal perfusion  IV hydration Strict I/O  At risk malnutrition Plan: Tube feeds currently on hold, defer to [facility] for start date May need parenteral hydration   Acute anemia -likely due to dilution, blood loss, critical illness -Hgb further dropped to 7.0 3/30, s/p 2 units PRBC Plan: Hemoglobin stable  Transfuse per protocol  Hemoglobin goal greater than 7 Trend CBC  Hyperglycemia -no previous known history of diabetes Plan: SSI CBG check q4hrs  Best practice:  Diet: TF Pain/Anxiety/Delirium protocol (if indicated): PAD protocol VAP protocol (if indicated): HOB > 30 DVT prophylaxis: heparin drip GI prophylaxis: ppi Glucose control: ssi Mobility: bed rest Code Status: full code Family Communication: husband updated at bedside 3/31 Disposition: ICU  LABS  PULMONARY Recent Labs  Lab 03/08/20 2240 03/09/20 0545 03/09/20 1005 03/12/20 1513 03/12/20 1738  PHART 7.380 7.305* 7.330* 7.324* 7.277*  PCO2ART 42.0 47.1 46.1 43.3 48.0  PO2ART 67.6* 63.3* 260* 122.0* 75.3*  HCO3 24.4 23.0 23.9 22.8 21.7  TCO2  --   --   --  24  --   O2SAT 92.2 90.2 99.3 99.0 94.4    CBC Recent Labs  Lab 03/12/20 0433 03/12/20 0433 03/12/20 1513 03/12/20 1757 03/13/20 0516  HGB 7.0*   < > 11.6* 12.1 13.2  HCT 21.9*   < > 34.0* 37.1 40.5  WBC 20.4*  --   --   16.9* 32.7*  PLT 235  --   --  289 325   < > = values in this interval not displayed.    COAGULATION Recent Labs  Lab 03/09/20 1752  INR 1.3*    CARDIAC  No results for input(s): TROPONINI in the last 168 hours. No results for input(s): PROBNP in the last 168 hours.   CHEMISTRY Recent Labs  Lab 03/10/20 0549 03/10/20 0549 03/11/20 0500 03/11/20 0500 03/12/20 0433 03/12/20 0433 03/12/20 1513 03/12/20 1513 03/12/20 1757 03/13/20 0516  NA 134*   < > 136  --  137  --  134*  --  138 139  K 4.2   < > 4.3   < > 3.9   < > 4.1   < > 3.6 3.9  CL 98  --  99  --  101  --   --   --  105 110  CO2 23  --  24  --  25  --   --   --  21* 20*  GLUCOSE 197*  --  177*  --  133*  --   --   --  148* 81  BUN 49*  --  72*  --  86*  --   --   --  80* 82*  CREATININE 2.22*  --  2.93*  --  2.81*  --   --   --  2.29* 2.53*  CALCIUM 7.6*  --  7.8*  --  7.8*  --   --   --  7.6* 7.0*  MG 2.3  --  2.5*  --  2.5*  --   --   --   --  2.2  PHOS 4.3  --  5.0*  --  5.3*  --   --   --   --  5.1*   < > = values in this interval not displayed.   Estimated Creatinine Clearance: 20.5 mL/min (A) (by C-G formula based on SCr of 2.53 mg/dL (H)).   LIVER Recent Labs  Lab 03/09/20 1307 03/09/20 1752 03/10/20 0549  AST 429* 440* 282*  ALT 131* 152* 148*  ALKPHOS 61 57 63  BILITOT 1.3* 1.0 0.8  PROT 4.4* 4.2* 4.7*  ALBUMIN 1.6* 1.8* 1.8*  INR  --  1.3*  --      INFECTIOUS Recent Labs  Lab 03/09/20 1307 03/09/20 1749 03/09/20 2343 03/10/20 0549 03/11/20 0500 03/11/20 1057  LATICACIDVEN 2.3*   < > 2.0* 2.0*  --  1.3  PROCALCITON 34.99  --   --  27.18 49.27  --    < > = values in this interval not displayed.     ENDOCRINE CBG (last 3)  Recent Labs    03/12/20 2108 03/12/20 2331 03/13/20 0514  GLUCAP 144* 127* 90    CRITICAL CARE Performed by: Brookie Wayment  F Twania Bujak  Total critical care time: 35 minutes  Critical care time was exclusive of separately billable procedures and treating  other patients.  Critical care was necessary to treat or prevent imminent or life-threatening deterioration.  Critical care was time spent personally by me on the following activities: development of treatment plan with patient and/or surrogate as well as nursing, discussions with consultants, evaluation of patient's response to treatment, examination of patient, obtaining history from patient or surrogate, ordering and performing treatments and interventions, ordering and review of laboratory studies, ordering and review of radiographic studies, pulse oximetry and re-evaluation of patient's condition.  Johnsie Cancel, NP-C [facility] Contact / Pager information can be found on Amion  03/13/2020, 7:44 AM

## 2020-03-13 NOTE — Progress Notes (Signed)
[facility] Progress Note Patient Name: Tracy [name] DOB: [birth year] MRN: [id number]   Date of Service  03/13/2020  HPI/Events of Note  Fever to 101.3 F - Request for Tylenol. AST and ALT both elevated, therefore, can't have Tylenol. Creatinine = 2.29, therefore, can't have Motrin.   eICU Interventions  Plan: 1. Ice packs PRN.     Intervention Category Major Interventions: Other:  Lysle Dingwall 03/13/2020, 3:18 AM

## 2020-03-13 NOTE — Progress Notes (Signed)
Nutrition Follow-up  DOCUMENTATION CODES:   Not applicable  INTERVENTION:  - will monitor for plans concerning nutrition support.    NUTRITION DIAGNOSIS:   Inadequate oral intake related to inability to eat as evidenced by NPO status. -ongoing  GOAL:   Patient will meet greater than or equal to 90% of their needs -unmet/unable to meet  MONITOR:   Vent status, Labs, Weight trends  REASON FOR ASSESSMENT:   Ventilator  ASSESSMENT:   69 year-old female with medical history of asthma, COPD, hemorrhoids, osteopenia, vitamin D deficiency, and seasonal allergies. She presented to the ED on 3/20 due to abdominal pain with associated nausea and diarrhea. These symptoms have been ongoing for several months.  Significant Events: 3/21- admission; emergent low anterior resection/colostomy formation 3/27- intubation and OGT placement 3/30- ex lap with colon resection and transverse colostomy  No family/visitors at bedside. Able to talk with RN. OGT to LIS since return from OR yesterday. Canister markings indicate 200 ml output during night shift and 100 ml output so far this shift. RN reports plan is to continue to hold TF at this time. She also reports, as does Surgery note from this AM, possible need for TPN.   Re-estimated kcal need based on admission weight (63 kg) as patient has moderate edema to all extremities and flow sheet indicates moderate edema to sacral area as well.   Patient is currently intubated on ventilator support MV: 8.4 L/min Temp (24hrs), Avg:99.6 F (37.6 C), Min:96.3 F (35.7 C), Max:101.8 F (38.8 C) Propofol: none BP: 83/60, MAP: 67  Labs reviewed; BUN: 82 mg/dl, creatinine: 2.53 mg/dl, Ca: 7 mg/dl, Phos: 5.1 mg/dl, GFR: 19 ml/min. Medications reviewed; 25 g albumin x2 doses 3/31, sliding scale novolog, 15 ml multivitamin/day, 40 mg IV protonix/day. Drips; neo @ 80 mcg/min, fentanyl @ 200 mcg/hr.    NUTRITION - FOCUSED PHYSICAL EXAM:  completed; no  muscle or fat wasting, moderate edema to all extremities  Diet Order:   Diet Order            Diet NPO time specified Except for: Sips with Meds  Diet effective now              EDUCATION NEEDS:   No education needs have been identified at this time  Skin:  Skin Assessment: Skin Integrity Issues: Skin Integrity Issues:: Incisions Incisions: abdomen (3/21 and 3/30)  Last BM:  3/30  Height:   Ht Readings from Last 1 Encounters:  03/09/20 5\' 2"  (1.575 m)    Weight:   Wt Readings from Last 1 Encounters:  03/13/20 73.5 kg     Estimated Nutritional Needs:  Kcal:  1610 kcal Protein:  127-140 Fluid:  >/= 1.6 L/day     Jarome Matin, MS, RD, LDN, CNSC Inpatient Clinical Dietitian RD pager # available in AMION  After hours/weekend pager # available in [facility]

## 2020-03-13 NOTE — Progress Notes (Signed)
[facility] Progress Note  1 Day Post-Op  Subjective: Patient taken back to the OR yesterday and splenic flexure resected. Now has transverse colostomy. Stoma is a little dusky. On pressors and had some labile pressures overnight. UOP down some. Febrile to 101.8 overnight. Husband at bedside.   Objective: Vital signs in last 24 hours: Temp:  [96.3 F (35.7 C)-101.8 F (38.8 C)] 100.6 F (38.1 C) (03/31 0615) Pulse Rate:  [81-139] 110 (03/31 0615) Resp:  [8-37] 24 (03/31 0615) BP: (62-179)/(36-79) 114/77 (03/31 0600) SpO2:  [92 %-100 %] 97 % (03/31 0615) Arterial Line BP: (71-199)/(58-80) 120/69 (03/31 0615) FiO2 (%):  [30 %-40 %] 40 % (03/31 0347) Weight:  [73.5 kg] 73.5 kg (03/31 0500) Last BM Date: 03/12/20(colostomy)  Intake/Output from previous day: 03/30 0701 - 03/31 0700 In: 3550.2 [I.V.:1841.5; Blood:760; NG/GT:500; IV Piggyback:448.8] Out: 2260 [Urine:960; Emesis/NG output:200; Drains:675; Stool:225; Blood:200] Intake/Output this shift: Total I/O In: 1567.2 [I.V.:1567.2] Out: -   PE: General:on the vent, sedated Heart:sinus tachycardia, edematous all over Lungs:diminished bilaterally,vent Abd: soft, ND, BS hypoactive,stomadusky and possibly necrotic around edges, small amount brown liquid drainage in bag, midline dressing c/d/i, drain in RLQ with ss fluid, OGT with bilious drainage   Lab Results:  Recent Labs    03/12/20 1757 03/13/20 0516  WBC 16.9* 32.7*  HGB 12.1 13.2  HCT 37.1 40.5  PLT 289 325   BMET Recent Labs    03/12/20 1757 03/13/20 0516  NA 138 139  K 3.6 3.9  CL 105 110  CO2 21* 20*  GLUCOSE 148* 81  BUN 80* 82*  CREATININE 2.29* 2.53*  CALCIUM 7.6* 7.0*   PT/INR No results for input(s): LABPROT, INR in the last 72 hours. CMP     Component Value Date/Time   NA 139 03/13/2020 0516   K 3.9 03/13/2020 0516   CL 110 03/13/2020 0516   CO2 20 (L) 03/13/2020 0516   GLUCOSE 81 03/13/2020 0516   BUN 82 (H)  03/13/2020 0516   CREATININE 2.53 (H) 03/13/2020 0516   CALCIUM 7.0 (L) 03/13/2020 0516   PROT 4.7 (L) 03/10/2020 0549   ALBUMIN 1.8 (L) 03/10/2020 0549   AST 282 (H) 03/10/2020 0549   ALT 148 (H) 03/10/2020 0549   ALKPHOS 63 03/10/2020 0549   BILITOT 0.8 03/10/2020 0549   GFRNONAA 19 (L) 03/13/2020 0516   GFRAA 22 (L) 03/13/2020 0516   Lipase     Component Value Date/Time   LIPASE 18 03/03/2020 1214       Studies/Results: CT ABDOMEN PELVIS WO CONTRAST  Result Date: 03/12/2020 CLINICAL DATA:  Abdominal abscess. Rectal cancer status post colon perforation. EXAM: CT ABDOMEN AND PELVIS WITHOUT CONTRAST TECHNIQUE: Multidetector CT imaging of the abdomen and pelvis was performed following the standard protocol without IV contrast. COMPARISON:  03/08/2020 FINDINGS: Lower chest: Emphysema noted in the lung bases with small bilateral pleural effusions. Hepatobiliary: Cirrhotic liver morphology described previously less evident on today's study. The tiny left hepatic lobe lesion described previously not well demonstrated on today's noncontrast exam. No focal abnormality in the liver on this study without intravenous contrast. Gallbladder is distended. No intrahepatic or extrahepatic biliary dilation. Pancreas: No focal mass lesion. No dilatation of the main duct. No intraparenchymal cyst. No peripancreatic edema. Spleen: No splenomegaly. No focal mass lesion. Adrenals/Urinary Tract: No adrenal nodule or mass. Both kidneys show retained intravenous contrast from the exam 4 days ago consistent with renal dysfunction. No hydronephrosis. No hydroureter. Foley catheter decompresses the urinary bladder.  Stomach/Bowel: Stomach is distended with gas and contrast material. NG tube is in the mid stomach. Duodenum is normally positioned as is the ligament of Treitz. Small bowel shows diffuse distension without 80 frankly obstructive appearance. Colon is also diffusely distended with circumferential wall  thickening in the splenic flexure and descending colon extending out to the sigmoid end colostomy. Short rectal stump evident. Vascular/Lymphatic: There is abdominal aortic atherosclerosis without aneurysm. There is no gastrohepatic or hepatoduodenal ligament lymphadenopathy. No retroperitoneal or mesenteric lymphadenopathy. No pelvic sidewall lymphadenopathy. Reproductive: Uterus not well seen.  There is no adnexal mass. Other: Intraperitoneal free air new since 03/08/2020 with relatively large pocket seen in the upper abdomen (axial 15/2), left subdiaphragmatic space anterior to the stomach on 9/2, and anterior abdomen on 37/2. Small volume free fluid noted in the pelvis/cul-de-sac. Trace fluid noted adjacent to the spleen. Small collections are seen in both upper quadrants with some high attenuation fluid in the anterior collections in the left upper quadrant (images 34 and 37 of series 2). Diffuse mesenteric edema evident with small fluid collections identified in the omentum of the hepatic flexure (39/2) and in the anterior left abdomen (37/2). Surgical drain again noted in the posterior pelvis. Musculoskeletal: Interval progression of diffuse body wall edema. No worrisome lytic or sclerotic osseous abnormality. IMPRESSION: 1. Exam is limited by lack of intravenous contrast, motion, and artifact from the patient's arms adjacent to the torso. Since 03/08/2020, there has been development of relatively large pockets of intraperitoneal free air. While intraperitoneal gas would not be unexpected on postoperative day 9, this gas is new since an intervening study of 03/08/2020 and given the relatively large volume certainly raises concern for bowel perforation. No source for the intraperitoneal free air is evident on this study. There is a surgical drain in the pelvis, but there is no free gas around the drain itself to suggest that it represents the source. 2. New circumferential wall thickening in the splenic  flexure, descending colon and sigmoid colon leading into the end colostomy. Infection/inflammation would be a consideration. Ischemia cannot be excluded. 3. Relatively small volume intraperitoneal free fluid. High attenuation small fluid collections in the left upper abdomen may reflect hemorrhage, infection or residua from prior perforation. 4. Interval progression of diffuse body wall edema. 5. Residual contrast material in the renal parenchyma from prior imaging, compatible with renal dysfunction. Electronically Signed   By: Misty Stanley M.D.   On: 03/12/2020 12:51   NM Pulmonary Perfusion  Result Date: 03/12/2020 CLINICAL DATA:  Respiratory failure EXAM: NUCLEAR MEDICINE PERFUSION LUNG SCAN TECHNIQUE: Perfusion images were obtained in multiple projections after intravenous injection of radiopharmaceutical. Ventilation scans intentionally deferred if perfusion scan and chest x-ray adequate for interpretation during COVID 19 epidemic. Views: Anterior, posterior, RPO, LPO, LAO, LPO RADIOPHARMACEUTICALS:  1.5 mCi Tc-74m MAA IV COMPARISON:  Chest radiograph March 12, 2020. CT abdomen and pelvis including lower lung regions March 08, 2020 FINDINGS: There is photopenia in the mid and lower lung zones in areas that on chest radiograph appear consistent with extensive underlying emphysematous change. Recent CT confirms extensive emphysematous change in the mid lower lung regions. No perfusion defects evident beyond areas of decreased uptake, felt to be due to extensive emphysema. IMPRESSION: Symmetric mid to lower lobe areas of photopenia, likely due to underlying bullous disease seen on recent chest radiograph and chest CT. No other perfusion defects evident. This study is considered relatively low probability for pulmonary embolism in this circumstance. If there remains strong clinical  concern for potential pulmonary embolus, CT angiogram chest would be a reasonable consideration. If contraindication to iodinated  contrast material administration, it may be reasonable to consider lower extremity duplex ultrasound to assess for possible lower extremity deep venous thrombosis. Electronically Signed   By: Lowella Grip III M.D.   On: 03/12/2020 12:17   DG CHEST PORT 1 VIEW  Result Date: 03/12/2020 CLINICAL DATA:  Hypoxia. EXAM: PORTABLE CHEST 1 VIEW COMPARISON:  03/10/2020 FINDINGS: Endotracheal tube, central catheter and NG tube all appear in good position. Heart size and pulmonary vascularity are normal. Slight fullness of the right hilum is probably due to rotation. Slight atelectasis in the right midzone. No discrete pulmonary infiltrates. IMPRESSION: Slight atelectasis in the right midzone. Improved aeration at the right lung apex. Electronically Signed   By: Lorriane Shire M.D.   On: 03/12/2020 11:42    Anti-infectives: Anti-infectives (From admission, onward)   Start     Dose/Rate Route Frequency Ordered Stop   03/14/20 1000  anidulafungin (ERAXIS) 100 mg in sodium chloride 0.9 % 100 mL IVPB     100 mg 78 mL/hr over 100 Minutes Intravenous Every 24 hours 03/13/20 0752     03/13/20 0900  anidulafungin (ERAXIS) 200 mg in sodium chloride 0.9 % 200 mL IVPB     200 mg 78 mL/hr over 200 Minutes Intravenous  Once 03/13/20 0752     03/11/20 2000  meropenem (MERREM) 500 mg in sodium chloride 0.9 % 100 mL IVPB     500 mg 200 mL/hr over 30 Minutes Intravenous Every 12 hours 03/11/20 1658     03/11/20 1200  piperacillin-tazobactam (ZOSYN) IVPB 2.25 g  Status:  Discontinued     2.25 g 100 mL/hr over 30 Minutes Intravenous Every 6 hours 03/11/20 1058 03/11/20 1641   03/06/20 1200  piperacillin-tazobactam (ZOSYN) IVPB 3.375 g  Status:  Discontinued     3.375 g 12.5 mL/hr over 240 Minutes Intravenous Every 8 hours 03/06/20 0739 03/11/20 1058   03/05/20 1400  piperacillin-tazobactam (ZOSYN) IVPB 2.25 g  Status:  Discontinued     2.25 g 100 mL/hr over 30 Minutes Intravenous Every 8 hours 03/05/20 0752  03/06/20 0739   03/04/20 2200  piperacillin-tazobactam (ZOSYN) IVPB 3.375 g  Status:  Discontinued     3.375 g 12.5 mL/hr over 240 Minutes Intravenous Every 8 hours 03/03/20 2032 03/05/20 0752   03/03/20 1545  cefoTEtan (CEFOTAN) 2 g in sodium chloride 0.9 % 100 mL IVPB     2 g 200 mL/hr over 30 Minutes Intravenous On call to O.R. 03/03/20 1532 03/04/20 0444   03/03/20 1345  piperacillin-tazobactam (ZOSYN) IVPB 3.375 g     3.375 g 12.5 mL/hr over 240 Minutes Intravenous  Once 03/03/20 1330 03/03/20 1507       Assessment/Plan COPD VDRF - vent per CCM, appreciate assistance, VQ scan with low probability for PE, heparin gtt stopped AKI - Cr2.53, UOPslowed overnight, continue to monitor closely ABL anemia - s/p 2 units PRBC 3/30, hgb 13 this AM, repeat labs this afternoon and monitor  Septic shock - IV abx and antifungal, on neo this AM    Perforated sigmoid colon Proximal rectal cancer with obstruction S/p emergent Low anterior resection/ colostomy by Dr. Marcello Moores 3/21 S/p exploratory laparotomy and colon resection with transverse colostomy 3/31 Dr. Hassell Done - POD#10/POD#1 - continue daily dressing changes - WOC following for ostomy teaching - stomarevised yesterday, somewhat dusky but viable - continue IV merrem, added eraxis today for antifungal coverage, pt  at high risk for post-op abscess development - repeat labs at 1200 today, check prealbumin, may need to start TPN  - drain SS with 675 cc out    FEN -NPO, IVF; may need to start TPN  VTE - SCDs, SQ heparin  ID - Zosyn3/21>3/29; Merrem 3/29>> Foley -in place Follow up - Dr. Marcello Moores  LOS: 10 days    Brigid Re , [facility] 03/13/2020, 8:33 AM Please see Amion for pager number during day hours 7:00am-4:30pm

## 2020-03-13 NOTE — Progress Notes (Signed)
OT Cancellation and Discharge Note  Patient Details Name: Tracy [name] MRN: [id number] DOB: Jun 12, 1951   Cancelled Treatment:    Reason Eval/Treat Not Completed: Medical issues which prohibited therapy.  Pt continues on vent with pressor support, and return to OR yesterday.  OT will sign off at this time as she is not medically appropriate for OT services, and with poor prognosis.  Please reorder if OT services become appropriate.  [name]., OTR/L Acute Rehabilitation Services Pager [phone] Office [phone]   Tracy [name] 03/13/2020, 6:37 AM

## 2020-03-14 LAB — DIFFERENTIAL
Abs Immature Granulocytes: 0 10*3/uL (ref 0.00–0.07)
Band Neutrophils: 25 %
Basophils Absolute: 0 10*3/uL (ref 0.0–0.1)
Basophils Relative: 0 %
Eosinophils Absolute: 0 10*3/uL (ref 0.0–0.5)
Eosinophils Relative: 0 %
Lymphocytes Relative: 2 %
Lymphs Abs: 0.5 10*3/uL — ABNORMAL LOW (ref 0.7–4.0)
Monocytes Absolute: 0.7 10*3/uL (ref 0.1–1.0)
Monocytes Relative: 3 %
Neutro Abs: 23.4 10*3/uL — ABNORMAL HIGH (ref 1.7–7.7)
Neutrophils Relative %: 70 %

## 2020-03-14 LAB — COMPREHENSIVE METABOLIC PANEL
ALT: 27 U/L (ref 0–44)
AST: 27 U/L (ref 15–41)
Albumin: 3.5 g/dL (ref 3.5–5.0)
Alkaline Phosphatase: 54 U/L (ref 38–126)
Anion gap: 13 (ref 5–15)
BUN: 84 mg/dL — ABNORMAL HIGH (ref 8–23)
CO2: 19 mmol/L — ABNORMAL LOW (ref 22–32)
Calcium: 8 mg/dL — ABNORMAL LOW (ref 8.9–10.3)
Chloride: 110 mmol/L (ref 98–111)
Creatinine, Ser: 2.27 mg/dL — ABNORMAL HIGH (ref 0.44–1.00)
GFR calc Af Amer: 25 mL/min — ABNORMAL LOW (ref >60)
GFR calc non Af Amer: 22 mL/min — ABNORMAL LOW (ref >60)
Glucose, Bld: 101 mg/dL — ABNORMAL HIGH (ref 70–99)
Potassium: 3.7 mmol/L (ref 3.5–5.1)
Sodium: 142 mmol/L (ref 135–145)
Total Bilirubin: 1.1 mg/dL (ref 0.3–1.2)
Total Protein: 5.3 g/dL — ABNORMAL LOW (ref 6.5–8.1)

## 2020-03-14 LAB — CBC
HCT: 28.1 % — ABNORMAL LOW (ref 36.0–46.0)
Hemoglobin: 9.3 g/dL — ABNORMAL LOW (ref 12.0–15.0)
MCH: 31.3 pg (ref 26.0–34.0)
MCHC: 33.1 g/dL (ref 30.0–36.0)
MCV: 94.6 fL (ref 80.0–100.0)
Platelets: 233 10*3/uL (ref 150–400)
RBC: 2.97 MIL/uL — ABNORMAL LOW (ref 3.87–5.11)
RDW: 15.9 % — ABNORMAL HIGH (ref 11.5–15.5)
WBC: 24.6 10*3/uL — ABNORMAL HIGH (ref 4.0–10.5)
nRBC: 0.1 % (ref 0.0–0.2)

## 2020-03-14 LAB — GLUCOSE, CAPILLARY
Glucose-Capillary: 100 mg/dL — ABNORMAL HIGH (ref 70–99)
Glucose-Capillary: 90 mg/dL (ref 70–99)
Glucose-Capillary: 106 mg/dL — ABNORMAL HIGH (ref 70–99)
Glucose-Capillary: 116 mg/dL — ABNORMAL HIGH (ref 70–99)
Glucose-Capillary: 137 mg/dL — ABNORMAL HIGH (ref 70–99)
Glucose-Capillary: 172 mg/dL — ABNORMAL HIGH (ref 70–99)

## 2020-03-14 LAB — SURGICAL PATHOLOGY

## 2020-03-14 LAB — CULTURE, BLOOD (ROUTINE X 2)
Culture: NO GROWTH
Culture: NO GROWTH
Special Requests: ADEQUATE
Special Requests: ADEQUATE

## 2020-03-14 LAB — PHOSPHORUS: Phosphorus: 4.6 mg/dL (ref 2.5–4.6)

## 2020-03-14 LAB — TRIGLYCERIDES: Triglycerides: 89 mg/dL (ref <150)

## 2020-03-14 LAB — MAGNESIUM: Magnesium: 2.4 mg/dL (ref 1.7–2.4)

## 2020-03-14 LAB — PREALBUMIN: Prealbumin: 6.5 mg/dL — ABNORMAL LOW (ref 18–38)

## 2020-03-14 MED ORDER — TRAVASOL 10 % IV SOLN
INTRAVENOUS | Status: AC
  Filled 2020-03-14: qty 672

## 2020-03-14 NOTE — Progress Notes (Addendum)
NAME:  Tracy [name], MRN:  [id number], DOB:  [birth year], LOS: 56 ADMISSION DATE:  03/03/2020, CONSULTATION DATE:  3/27/021 REFERRING MD: Dr Ninfa Linden of [facility], CHIEF COMPLAINT:  Post op copd consult   Brief History   69 y/o F admitted 3/21 with bowel perforation requiring lower anterior resection and colostomy for newly diagnosed proximal rectal cancer associated with distal sigmoid perforation by Dr. Doreene Adas.  There was no obvious metastatic disease on the parietal peritoneum or liver.  Post-operatively she developed septic shock, RUL HAP requiring intubation (3/27), AKI, post-operative ileus, possible liver lesion, acute delirium.   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 Valeta Harms. Not on O2.  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, metastatic carcinoma in 3/15 lymph nodes  LE [name] 3/27 >> negative for DVT  ECHO 3/27 >> poor windows, normal LV function, severely dilated RV with moderately reduced function, RV volume and pressure overload, mildly dilated RA  V/Q Scan 3/30 >> low probability for PE  Micro Data:  COVID 3/21 >> Negative MRSA PCR 3/21 >> Negative Blood cultures 3/27 >> negative  Urine culture 3/26 >> insignificant growth  Tracheal Aspirate 3/29 >>   Antimicrobials:  Zosyn 3/21 >> 3/29 Anidulafungin 3/31 >> Meropenem 3/29 >>  Interim history/subjective:  Tmax 100.9 / WBC  24.6 Glucose range 90-121 I/O 1L UOP, 1.6L+ in 24 hours, 133ml out of JP drain RN reports failed attempts at PSV due to sedation, fentanyl gtt in process of being weaned. Phenylephrine weaned off this am   Objective   Blood pressure 133/75, pulse 95, temperature 98.6 F (37 C), resp. rate 10, height 5\' 2"  (1.575 m), weight 73.2 kg, last menstrual period 12/14/2000, SpO2 96 %.    Vent Mode: PRVC FiO2 (%):  [30 %-40 %] 30 % Set Rate:  [10 bmp-18 bmp] 10 bmp Vt Set:  [400 mL] 400 mL PEEP:  [5 cmH20] 5 cmH20 Pressure Support:  [5 cmH20] 5 cmH20 Plateau Pressure:  [14 cmH20-18 cmH20] 14 cmH20   Intake/Output Summary (Last 24 hours) at 03/14/2020 1023 Last data filed at 03/14/2020 0900 Gross per 24 hour  Intake 2193.12 ml  Output 1970 ml  Net 223.12 ml   Filed Weights   03/12/20 0449 03/13/20 0500 03/14/20 0500  Weight: 74.1 kg 73.5 kg 73.2 kg    Examination: General: petite female lying in bed in NAD on vent   HEENT: MM pink/moist, pupils 88mm, anicteric, ETT, LIJ TLC Neuro: sedate, awakens to voice, follows commands  CV: s1s2 RRR, no m/r/g PULM:  Non-labored on vent, lungs bilaterally clear  GI: midline abd dressing, LLQ ostomy with brown stool, RLQ JP drain  Extremities: warm/dry, trace to 1+ generalized edema  Skin: no rashes or lesions  Resolved Hospital Problem list   Shock    Assessment & Plan:   Acute Hypoxic Respiratory Failure  Baseline Gold stage III/IV COPD   Questionable PE Suspect multifactorial decompensation with  delirium, COPD exacerbation, narcotics, possible aspiration event.  ECHO with RV enlargement, suspect secondary to underlying lung disease.  No prior ECHO for comparison.  Acute PE ruled out with VQ and negative LE duplex.   -PRVC 4-8cc/kg  -wean PEEP / FiO2 for sats 88-95% -follow intermittent CXR  -wean sedation with plan for SBT this afternoon, possible extubation  -continue bronchodilators -VAP prevention measures   Acute Delirium  Noted 3/26  PM, suspect in setting of opioid use, COPD exacerbation and critical illness  -minimize sedation as able, reduce ceiling of fentanyl infusion  -Delirium prevention measures  -PAD protocol for sedation  -RASS Goal 0 to -1   Colon cancer with Perforation causing Peritonitis, s/p Resection 3/21 WBC elevated, concern for possible developing abscess  -continue meropenem, anidulafungin  -Post operative recommendations per [facility] -follow drain output  -follow cultures to maturity  -begin TPN per pharmacy   Acute Kidney Injury  Improving, suspect secondary to critical illness, dye load, shock  -Trend BMP / urinary output -Replace electrolytes as indicated -Avoid nephrotoxic agents, ensure adequate renal perfusion  Moderate Malnutrition -TPN per pharmacy  -defer timing of enteral feeding to [facility]  Anemia In setting of critical illness, blood loss, component of dilution, s/p 2 units PRBC -trend CBC  -transfuse for Hgb <7% or active bleeding   Hyperglycemia -SSI, standard scale   Best practice:  Diet: TPN Pain/Anxiety/Delirium protocol (if indicated): PAD protocol VAP protocol (if indicated): HOB > 30 DVT prophylaxis: heparin SQ GI prophylaxis: ppi Glucose control: ssi Mobility: bed rest Code Status: full code Family Communication: Husband updated at bedside 4/1 on plan of care  Disposition: ICU  LABS    PULMONARY Recent Labs  Lab 03/08/20 2240 03/09/20 0545 03/09/20 1005 03/12/20 1513 03/12/20 1738  PHART 7.380 7.305* 7.330* 7.324* 7.277*  PCO2ART 42.0 47.1 46.1 43.3 48.0  PO2ART 67.6* 63.3* 260* 122.0* 75.3*  HCO3 24.4 23.0 23.9 22.8 21.7  TCO2  --   --   --  24  --   O2SAT 92.2 90.2 99.3 99.0 94.4    CBC Recent Labs  Lab 03/13/20 0516 03/13/20 1141 03/14/20 0544  HGB 13.2 10.1* 9.3*  HCT 40.5 30.8* 28.1*  WBC 32.7* 26.7* 24.6*  PLT 325 240 233    COAGULATION Recent Labs  Lab 03/09/20 1752  INR 1.3*    CARDIAC  No results for input(s): TROPONINI in  the last 168 hours. No results for input(s): PROBNP in the last 168 hours.   CHEMISTRY Recent Labs  Lab 03/10/20 0549 03/10/20 0549 03/11/20 0500 03/11/20 0500 03/12/20 0433 03/12/20 0433 03/12/20 1513 03/12/20 1513 03/12/20 1757 03/12/20 1757 03/13/20 0516 03/14/20 0544  NA 134*   < > 136   < > 137  --  134*  --  138  --  139 142  K 4.2   < > 4.3   < > 3.9   < > 4.1   < > 3.6   < > 3.9 3.7  CL 98   < > 99  --  101  --   --   --  105  --  110 110  CO2 23   < > 24  --  25  --   --   --  21*  --  20* 19*  GLUCOSE 197*   < > 177*  --  133*  --   --   --  148*  --  81 101*  BUN 49*   < > 72*  --  86*  --   --   --  80*  --  82* 84*  CREATININE 2.22*   < > 2.93*  --  2.81*  --   --   --  2.29*  --  2.53* 2.27*  CALCIUM 7.6*   < > 7.8*  --  7.8*  --   --   --  7.6*  --  7.0* 8.0*  MG 2.3  --  2.5*  --  2.5*  --   --   --   --   --  2.2 2.4  PHOS 4.3  --  5.0*  --  5.3*  --   --   --   --   --  5.1* 4.6   < > = values in this interval not displayed.   Estimated Creatinine Clearance: 22.8 mL/min (A) (by C-G formula based on SCr of 2.27 mg/dL (H)).   LIVER Recent Labs  Lab 03/09/20 1307 03/09/20 1752 03/10/20 0549 03/14/20 0544  AST 429* 440* 282* 27  ALT 131* 152* 148* 27  ALKPHOS 61 57 63 54  BILITOT 1.3* 1.0 0.8 1.1  PROT 4.4* 4.2* 4.7* 5.3*  ALBUMIN 1.6* 1.8* 1.8* 3.5  INR  --  1.3*  --   --      INFECTIOUS Recent Labs  Lab 03/09/20 1307 03/09/20 1749 03/09/20 2343 03/10/20 0549 03/11/20 0500 03/11/20 1057  LATICACIDVEN 2.3*   < > 2.0* 2.0*  --  1.3  PROCALCITON 34.99  --   --  27.18 49.27  --    < > = values in this interval not displayed.     ENDOCRINE CBG (last 3)  Recent Labs    03/13/20 2322 03/14/20 0304 03/14/20 0825  GLUCAP 121* 100* 90    CRITICAL CARE Performed by: Noe Gens  Total critical care time: 35 minutes  Critical care time was exclusive of separately billable procedures and treating other patients.  Critical care was  necessary to treat or prevent imminent or life-threatening deterioration.  Critical care was time spent personally by me on the following activities: development of treatment plan with patient and/or surrogate as well as nursing, discussions with consultants, evaluation of patient's response to treatment, examination of patient, obtaining history from patient or surrogate, ordering and performing treatments and interventions, ordering and review of laboratory studies, ordering and review of radiographic studies, pulse oximetry and re-evaluation of patient's condition.   Noe Gens, MSN, NP-C [facility] 03/14/2020, 10:24 AM   Please see [email or web address] for pager details.

## 2020-03-14 NOTE — Progress Notes (Signed)
[facility] Progress Note  2 Days Post-Op  Subjective: Patient with more stool output from stoma overnight, bilious drainage from OGT. UOP improving. On pressors still. Fever curve improving.   Objective: Vital signs in last 24 hours: Temp:  [99 F (37.2 C)-100.9 F (38.3 C)] 99 F (37.2 C) (04/01 0642) Pulse Rate:  [95-115] 95 (04/01 0642) Resp:  [8-37] 11 (04/01 0642) BP: (83-126)/(55-72) 102/66 (03/31 2054) SpO2:  [95 %-100 %] 96 % (04/01 [id number]) Arterial Line BP: (92-162)/(49-80) 135/63 (04/01 0642) FiO2 (%):  [30 %-40 %] 30 % (04/01 0307) Weight:  [73.2 kg] 73.2 kg (04/01 0500) Last BM Date: 03/12/20(colostomy)  Intake/Output from previous day: 03/31 0701 - 04/01 0700 In: 3663.4 [I.V.:2945.1; IV Piggyback:718.3] Out: 1670 [Urine:1095; Emesis/NG output:425; Drains:100; Stool:50] Intake/Output this shift: No intake/output data recorded.  PE: General:on the vent, opens eyes to voice  Heart:RRR, edematous all over Lungs:diminished bilaterally,vent Abd: soft, ND, BS hypoactive,stomared and dusky around perimeter, small amount brown stool present, midline dressing c/d/i, drain in RLQ with ss fluid, OGT with bilious drainage   Lab Results:  Recent Labs    03/13/20 1141 03/14/20 0544  WBC 26.7* 24.6*  HGB 10.1* 9.3*  HCT 30.8* 28.1*  PLT 240 233   BMET Recent Labs    03/13/20 0516 03/14/20 0544  NA 139 142  K 3.9 3.7  CL 110 110  CO2 20* 19*  GLUCOSE 81 101*  BUN 82* 84*  CREATININE 2.53* 2.27*  CALCIUM 7.0* 8.0*   PT/INR No results for input(s): LABPROT, INR in the last 72 hours. CMP     Component Value Date/Time   NA 142 03/14/2020 0544   K 3.7 03/14/2020 0544   CL 110 03/14/2020 0544   CO2 19 (L) 03/14/2020 0544   GLUCOSE 101 (H) 03/14/2020 0544   BUN 84 (H) 03/14/2020 0544   CREATININE 2.27 (H) 03/14/2020 0544   CALCIUM 8.0 (L) 03/14/2020 0544   PROT 5.3 (L) 03/14/2020 0544   ALBUMIN 3.5 03/14/2020 0544   AST 27 03/14/2020  0544   ALT 27 03/14/2020 0544   ALKPHOS 54 03/14/2020 0544   BILITOT 1.1 03/14/2020 0544   GFRNONAA 22 (L) 03/14/2020 0544   GFRAA 25 (L) 03/14/2020 0544   Lipase     Component Value Date/Time   LIPASE 18 03/03/2020 1214       Studies/Results: CT ABDOMEN PELVIS WO CONTRAST  Result Date: 03/12/2020 CLINICAL DATA:  Abdominal abscess. Rectal cancer status post colon perforation. EXAM: CT ABDOMEN AND PELVIS WITHOUT CONTRAST TECHNIQUE: Multidetector CT imaging of the abdomen and pelvis was performed following the standard protocol without IV contrast. COMPARISON:  03/08/2020 FINDINGS: Lower chest: Emphysema noted in the lung bases with small bilateral pleural effusions. Hepatobiliary: Cirrhotic liver morphology described previously less evident on today's study. The tiny left hepatic lobe lesion described previously not well demonstrated on today's noncontrast exam. No focal abnormality in the liver on this study without intravenous contrast. Gallbladder is distended. No intrahepatic or extrahepatic biliary dilation. Pancreas: No focal mass lesion. No dilatation of the main duct. No intraparenchymal cyst. No peripancreatic edema. Spleen: No splenomegaly. No focal mass lesion. Adrenals/Urinary Tract: No adrenal nodule or mass. Both kidneys show retained intravenous contrast from the exam 4 days ago consistent with renal dysfunction. No hydronephrosis. No hydroureter. Foley catheter decompresses the urinary bladder. Stomach/Bowel: Stomach is distended with gas and contrast material. NG tube is in the mid stomach. Duodenum is normally positioned as is the ligament of Treitz. Small  bowel shows diffuse distension without 80 frankly obstructive appearance. Colon is also diffusely distended with circumferential wall thickening in the splenic flexure and descending colon extending out to the sigmoid end colostomy. Short rectal stump evident. Vascular/Lymphatic: There is abdominal aortic atherosclerosis without  aneurysm. There is no gastrohepatic or hepatoduodenal ligament lymphadenopathy. No retroperitoneal or mesenteric lymphadenopathy. No pelvic sidewall lymphadenopathy. Reproductive: Uterus not well seen.  There is no adnexal mass. Other: Intraperitoneal free air new since 03/08/2020 with relatively large pocket seen in the upper abdomen (axial 15/2), left subdiaphragmatic space anterior to the stomach on 9/2, and anterior abdomen on 37/2. Small volume free fluid noted in the pelvis/cul-de-sac. Trace fluid noted adjacent to the spleen. Small collections are seen in both upper quadrants with some high attenuation fluid in the anterior collections in the left upper quadrant (images 34 and 37 of series 2). Diffuse mesenteric edema evident with small fluid collections identified in the omentum of the hepatic flexure (39/2) and in the anterior left abdomen (37/2). Surgical drain again noted in the posterior pelvis. Musculoskeletal: Interval progression of diffuse body wall edema. No worrisome lytic or sclerotic osseous abnormality. IMPRESSION: 1. Exam is limited by lack of intravenous contrast, motion, and artifact from the patient's arms adjacent to the torso. Since 03/08/2020, there has been development of relatively large pockets of intraperitoneal free air. While intraperitoneal gas would not be unexpected on postoperative day 9, this gas is new since an intervening study of 03/08/2020 and given the relatively large volume certainly raises concern for bowel perforation. No source for the intraperitoneal free air is evident on this study. There is a surgical drain in the pelvis, but there is no free gas around the drain itself to suggest that it represents the source. 2. New circumferential wall thickening in the splenic flexure, descending colon and sigmoid colon leading into the end colostomy. Infection/inflammation would be a consideration. Ischemia cannot be excluded. 3. Relatively small volume intraperitoneal free  fluid. High attenuation small fluid collections in the left upper abdomen may reflect hemorrhage, infection or residua from prior perforation. 4. Interval progression of diffuse body wall edema. 5. Residual contrast material in the renal parenchyma from prior imaging, compatible with renal dysfunction. Electronically Signed   By: Misty Stanley M.D.   On: 03/12/2020 12:51   NM Pulmonary Perfusion  Result Date: 03/12/2020 CLINICAL DATA:  Respiratory failure EXAM: NUCLEAR MEDICINE PERFUSION LUNG SCAN TECHNIQUE: Perfusion images were obtained in multiple projections after intravenous injection of radiopharmaceutical. Ventilation scans intentionally deferred if perfusion scan and chest x-ray adequate for interpretation during COVID 19 epidemic. Views: Anterior, posterior, RPO, LPO, LAO, LPO RADIOPHARMACEUTICALS:  1.5 mCi Tc-36m MAA IV COMPARISON:  Chest radiograph March 12, 2020. CT abdomen and pelvis including lower lung regions March 08, 2020 FINDINGS: There is photopenia in the mid and lower lung zones in areas that on chest radiograph appear consistent with extensive underlying emphysematous change. Recent CT confirms extensive emphysematous change in the mid lower lung regions. No perfusion defects evident beyond areas of decreased uptake, felt to be due to extensive emphysema. IMPRESSION: Symmetric mid to lower lobe areas of photopenia, likely due to underlying bullous disease seen on recent chest radiograph and chest CT. No other perfusion defects evident. This study is considered relatively low probability for pulmonary embolism in this circumstance. If there remains strong clinical concern for potential pulmonary embolus, CT angiogram chest would be a reasonable consideration. If contraindication to iodinated contrast material administration, it may be reasonable to consider lower  extremity duplex ultrasound to assess for possible lower extremity deep venous thrombosis. Electronically Signed   By: Lowella Grip III M.D.   On: 03/12/2020 12:17   DG CHEST PORT 1 VIEW  Result Date: 03/12/2020 CLINICAL DATA:  Hypoxia. EXAM: PORTABLE CHEST 1 VIEW COMPARISON:  03/10/2020 FINDINGS: Endotracheal tube, central catheter and NG tube all appear in good position. Heart size and pulmonary vascularity are normal. Slight fullness of the right hilum is probably due to rotation. Slight atelectasis in the right midzone. No discrete pulmonary infiltrates. IMPRESSION: Slight atelectasis in the right midzone. Improved aeration at the right lung apex. Electronically Signed   By: Lorriane Shire M.D.   On: 03/12/2020 11:42    Anti-infectives: Anti-infectives (From admission, onward)   Start     Dose/Rate Route Frequency Ordered Stop   03/14/20 1000  anidulafungin (ERAXIS) 100 mg in sodium chloride 0.9 % 100 mL IVPB     100 mg 78 mL/hr over 100 Minutes Intravenous Every 24 hours 03/13/20 0752     03/13/20 0900  anidulafungin (ERAXIS) 200 mg in sodium chloride 0.9 % 200 mL IVPB     200 mg 78 mL/hr over 200 Minutes Intravenous  Once 03/13/20 0752 03/13/20 1241   03/11/20 2000  meropenem (MERREM) 500 mg in sodium chloride 0.9 % 100 mL IVPB     500 mg 200 mL/hr over 30 Minutes Intravenous Every 12 hours 03/11/20 1658     03/11/20 1200  piperacillin-tazobactam (ZOSYN) IVPB 2.25 g  Status:  Discontinued     2.25 g 100 mL/hr over 30 Minutes Intravenous Every 6 hours 03/11/20 1058 03/11/20 1641   03/06/20 1200  piperacillin-tazobactam (ZOSYN) IVPB 3.375 g  Status:  Discontinued     3.375 g 12.5 mL/hr over 240 Minutes Intravenous Every 8 hours 03/06/20 0739 03/11/20 1058   03/05/20 1400  piperacillin-tazobactam (ZOSYN) IVPB 2.25 g  Status:  Discontinued     2.25 g 100 mL/hr over 30 Minutes Intravenous Every 8 hours 03/05/20 0752 03/06/20 0739   03/04/20 2200  piperacillin-tazobactam (ZOSYN) IVPB 3.375 g  Status:  Discontinued     3.375 g 12.5 mL/hr over 240 Minutes Intravenous Every 8 hours 03/03/20 2032 03/05/20 0752    03/03/20 1545  cefoTEtan (CEFOTAN) 2 g in sodium chloride 0.9 % 100 mL IVPB     2 g 200 mL/hr over 30 Minutes Intravenous On call to O.R. 03/03/20 1532 03/04/20 0444   03/03/20 1345  piperacillin-tazobactam (ZOSYN) IVPB 3.375 g     3.375 g 12.5 mL/hr over 240 Minutes Intravenous  Once 03/03/20 1330 03/03/20 1507       Assessment/Plan COPD VDRF -vent per CCM, appreciate assistance, VQ scan with low probability for PE, heparin gtt stopped AKI - Cr2.27, UOPimproving ABL anemia - s/p 2 units PRBC 3/30, hgb 9.3 today Septic shock - IV abx and antifungal, on neo this AM    Perforated sigmoid colon Proximal rectal cancer with obstruction S/p emergent Low anterior resection/ colostomy by Dr. Marcello Moores 3/21 S/p exploratory laparotomy and colon resection with transverse colostomy 3/31 Dr. Hassell Done - POD#11/POD#2 - continue daily dressing changes - WOC following for ostomy teaching - stomarevised yesterday, somewhat dusky but viable - WBC 24.6, Tmax 100.9 - continue IV merrem and eraxis, pt at high risk for post-op abscess development - start TPN today, prealbumin 6.5 today, Clamping trials with OGT - if residuals low may be able to start some trickle TF over the weekend  - drain SS with 100 cc  out    FEN -NPO, clamping OGT, TPN VTE - SCDs,SQ heparin  ID - Zosyn3/21>3/29; Merrem 3/29>>, Eraxis 3/31>> Foley -in place   LOS: 11 days    Brigid Re , [facility] 03/14/2020, 7:46 AM Please see Amion for pager number during day hours 7:00am-4:30pm

## 2020-03-14 NOTE — Progress Notes (Signed)
Pharmacy Antibiotic Note  Tracy [name] is a 69 y.o. female admitted on 03/03/2020 with intra-abdominal infection.  Pharmacy has been consulted for meropenem dosing.  Pt is currently POD#11/POD#2 for perforated colon. Pt is currently on day #10 of total antibiotics, day #4 Meropenem and day #2 Eraxis.  Tm 100.9, WBC down to 24.6, SCr down to 2.27  Plan:  Meropenem 500 mg IV q12h (CrCl 22 mL/min)  Follow renal function for necessary dose adjustments.   Height: 5\' 2"  (157.5 cm) Weight: 73.2 kg (161 lb 6 oz) IBW/kg (Calculated) : 50.1  Temp (24hrs), Avg:99.5 F (37.5 C), Min:98.6 F (37 C), Max:100.9 F (38.3 C)  Recent Labs  Lab 03/09/20 1307 03/09/20 1749 03/09/20 1752 03/09/20 2343 03/10/20 0549 03/10/20 0549 03/11/20 0500 03/11/20 1057 03/11/20 2231 03/12/20 0433 03/12/20 1757 03/13/20 0516 03/13/20 1141 03/14/20 0544  WBC 26.8*  --    < >  --  36.6*  --   --   --    < > 20.4* 16.9* 32.7* 26.7* 24.6*  CREATININE 1.60*  --    < >  --  2.22*   < > 2.93*  --   --  2.81* 2.29* 2.53*  --  2.27*  LATICACIDVEN 2.3* 2.4*  --  2.0* 2.0*  --   --  1.3  --   --   --   --   --   --    < > = values in this interval not displayed.    Estimated Creatinine Clearance: 22.8 mL/min (A) (by C-G formula based on SCr of 2.27 mg/dL (H)).    Allergies  Allergen Reactions  . Codeine Swelling    Antimicrobials this admission:  3/21 Cefotetan x 1 3/22 Zosyn >> 3/29 3/29 Meropenem >>  3/31 Eraxis >>   Dose adjustments this admission:  3/23 ZEI> 2.25 q8 3/24 2.25> ZEI  Microbiology results:  3/21 Influenza: neg; Covid: neg 3/21 MRSA PCR neg 3/26 UCx: < 10k colonies 3/27 BC: NGF 3/29 TA: few yeast  Thank you for allowing pharmacy to be a part of this patient's care.  Gretta Arab PharmD, BCPS Pager: [phone] 03/14/2020 10:41 AM

## 2020-03-14 NOTE — Progress Notes (Signed)
Nutrition Follow-up  DOCUMENTATION CODES:   Not applicable  INTERVENTION:  TPN per Pharmacist  NUTRITION DIAGNOSIS:   Inadequate oral intake related to inability to eat as evidenced by NPO status. Ongoing   GOAL:   Patient will meet greater than or equal to 90% of their needs Progressing, initiating nutrition support   MONITOR:   Vent status, Labs, Weight trends  REASON FOR ASSESSMENT:   Ventilator Enteral/tube feeding initiation and management  ASSESSMENT:   70 year-old female with medical history of asthma, COPD, hemorrhoids, osteopenia, vitamin D deficiency, and seasonal allergies. She presented to the ED on 3/20 due to abdominal pain with associated nausea and diarrhea. These symptoms have been ongoing for several months.  Significant Events: 3/21- admission; emergent low anterior resection/colostomy formation 3/27- intubation and OGT placement 3/30- ex lap with colon resection and transverse colostomy  RD consulted for new TNA Please see RD note from 3/31 for full assessment.  Per notes: -UOP improving -fever curve improving -more stool output from stoma overnight, bilious drainage from OGT  Re-estimated kcal needs based on admission wt (63kg) and current MV.  Patient is currently intubated on ventilator support MV: 6.9 L/min Temp (24hrs), Avg:99.6 F (37.6 C), Min:98.6 F (37 C), Max:100.9 F (38.3 C)  BP: 115/48 (a-line) MAP: 71 (a-line)  I/O: +8697 ml since admit   +2010 x 24 hrs Emesis/NG: 425 ml x 24 hrs    Drains:  100 ml x 24 hrs Stool: 50 ml x 24 hrs UOP: 1095 ml x 24 hrs  Propofol: none ml/hr Medications reviewed and include: SSI Drips: Eraxis Merrem Fentanyl 200 mcg Phenylephrine 65 mcg  Labs: CBGs 90,100,121,115 x 24 hrs, BUN 84 (H), Cr 2.27, WBC 24.6 (H) K/Mg/P - WNL  Diet Order:   Diet Order            Diet NPO time specified Except for: Sips with Meds  Diet effective now              EDUCATION NEEDS:   No education  needs have been identified at this time  Skin:  Skin Assessment: Skin Integrity Issues: Skin Integrity Issues:: Incisions Incisions: abdomen (3/21 and 3/30)  Last BM:  3/30  Height:   Ht Readings from Last 1 Encounters:  03/09/20 5\' 2"  (1.575 m)    Weight:   Wt Readings from Last 1 Encounters:  03/14/20 73.2 kg    Ideal Body Weight:  50 kg  BMI:  Body mass index is 29.52 kg/m.  Estimated Nutritional Needs:   Kcal:  [id number]  Protein:  127-140  Fluid:  >/= 1.5 L/day   Lajuan Lines, RD, LDN Clinical Nutrition After Hours/Weekend Pager # [phone] [place]

## 2020-03-14 NOTE — Progress Notes (Signed)
PHARMACY - TOTAL PARENTERAL NUTRITION CONSULT NOTE   Indication: Prolonged ileus  Patient Measurements: Height: 5\' 2"  (157.5 cm) Weight: 161 lb 6 oz (73.2 kg) IBW/kg (Calculated) : 50.1   Body mass index is 29.52 kg/m. Usual Weight:   Assessment:  72 yoF admitted on 3/21 bowel perforation s/p surgery on 3/21 and 3/31.  She remains intubated, on vasopressors, on antibiotic and antifungal coverage.  Tube feeds were given 3/27-3/29, then held.  OGT is still to suction, has bilious drainage.   Pharmacy is consulted to dose TPN.    Glucose / Insulin: CBGs low/stable 100-120s prior to TPN.  Novolog 2-6 units q4h (used 4 units/24 hours) A1c 5.9, no PTA medications Electrolytes: Co2 low, others wnl Renal: SCr down to 2.27, BUN up to 84 LFTs / TGs: LFTs WNL / TG 89 Prealbumin / albumin: 6.5 / 3.5 Intake / Output; MIVF: net +8.9L this admit; mIVF NS @KVO  Medications:  MVI, PPI.  Phenylephrine down to 20 mg/min GI Imaging: Surgeries / Procedures:  3/21 emergent Low anterior resection/ colostomy  3/31 exploratory laparotomy and colon resection with transverse colostomy  Central access: CVC triple lumen (placed 3/27) TPN start date: 4/1  Nutritional Goals (per RD recommendation on 4/1): Kcal:  1564, Protein:  127-140, Fluid:  >/= 1.5 L/day Goal TPN rate is 80 mL/hr (provides 134 g of protein and 1516 kcals per day)  While lipids are held for the first 7 days, goal will be to meet 100% of the patient's protein needs and approximately 80% of their caloric needs.  Glucose infusion rate will be 2.7 mg/kg/min (Maximum 5 mg/kg/min)  Current Nutrition:  NPO  Plan:  Start TPN at 40 mL/hr at 1800 Hold lipid emulsion for first 7 days for critically ill patients per ASPEN guidelines (Start date 4/8) Electrolytes in TPN: 90mEq/L of Na, 1mEq/L of K, 61mEq/L of Ca, 11mEq/L of Mg, and decreased to 10 mmol/L of Phos. Cl:Ac ratio - max Ac Add standard MVI to TPN on MWF only  Add standard trace  elements to TPN daily Continue SSI q4h and adjust as needed  Monitor TPN labs on Mon/Thurs; Bmet, mag, phos daily x3 days.  Gretta Arab PharmD, BCPS Pager: [phone] 03/14/2020 7:43 AM

## 2020-03-15 ENCOUNTER — Inpatient Hospital Stay (HOSPITAL_COMMUNITY): Payer: Medicare HMO

## 2020-03-15 DIAGNOSIS — R601 Generalized edema: Secondary | ICD-10-CM

## 2020-03-15 DIAGNOSIS — C187 Malignant neoplasm of sigmoid colon: Secondary | ICD-10-CM

## 2020-03-15 DIAGNOSIS — I10 Essential (primary) hypertension: Secondary | ICD-10-CM

## 2020-03-15 DIAGNOSIS — J439 Emphysema, unspecified: Secondary | ICD-10-CM

## 2020-03-15 LAB — BASIC METABOLIC PANEL
Anion gap: 10 (ref 5–15)
BUN: 70 mg/dL — ABNORMAL HIGH (ref 8–23)
CO2: 21 mmol/L — ABNORMAL LOW (ref 22–32)
Calcium: 8 mg/dL — ABNORMAL LOW (ref 8.9–10.3)
Chloride: 116 mmol/L — ABNORMAL HIGH (ref 98–111)
Creatinine, Ser: 1.62 mg/dL — ABNORMAL HIGH (ref 0.44–1.00)
GFR calc Af Amer: 38 mL/min — ABNORMAL LOW (ref >60)
GFR calc non Af Amer: 33 mL/min — ABNORMAL LOW (ref >60)
Glucose, Bld: 180 mg/dL — ABNORMAL HIGH (ref 70–99)
Potassium: 3.3 mmol/L — ABNORMAL LOW (ref 3.5–5.1)
Sodium: 147 mmol/L — ABNORMAL HIGH (ref 135–145)

## 2020-03-15 LAB — GLUCOSE, CAPILLARY
Glucose-Capillary: 104 mg/dL — ABNORMAL HIGH (ref 70–99)
Glucose-Capillary: 150 mg/dL — ABNORMAL HIGH (ref 70–99)
Glucose-Capillary: 154 mg/dL — ABNORMAL HIGH (ref 70–99)
Glucose-Capillary: 157 mg/dL — ABNORMAL HIGH (ref 70–99)
Glucose-Capillary: 159 mg/dL — ABNORMAL HIGH (ref 70–99)
Glucose-Capillary: 215 mg/dL — ABNORMAL HIGH (ref 70–99)

## 2020-03-15 LAB — CBC WITH DIFFERENTIAL/PLATELET
Abs Immature Granulocytes: 0.37 10*3/uL — ABNORMAL HIGH (ref 0.00–0.07)
Basophils Absolute: 0.1 10*3/uL (ref 0.0–0.1)
Basophils Relative: 0 %
Eosinophils Absolute: 0 10*3/uL (ref 0.0–0.5)
Eosinophils Relative: 0 %
HCT: 29 % — ABNORMAL LOW (ref 36.0–46.0)
Hemoglobin: 9.7 g/dL — ABNORMAL LOW (ref 12.0–15.0)
Immature Granulocytes: 2 %
Lymphocytes Relative: 1 %
Lymphs Abs: 0.2 10*3/uL — ABNORMAL LOW (ref 0.7–4.0)
MCH: 31.2 pg (ref 26.0–34.0)
MCHC: 33.4 g/dL (ref 30.0–36.0)
MCV: 93.2 fL (ref 80.0–100.0)
Monocytes Absolute: 0.8 10*3/uL (ref 0.1–1.0)
Monocytes Relative: 4 %
Neutro Abs: 16.3 10*3/uL — ABNORMAL HIGH (ref 1.7–7.7)
Neutrophils Relative %: 93 %
Platelets: 316 10*3/uL (ref 150–400)
RBC: 3.11 MIL/uL — ABNORMAL LOW (ref 3.87–5.11)
RDW: 15.7 % — ABNORMAL HIGH (ref 11.5–15.5)
WBC: 17.7 10*3/uL — ABNORMAL HIGH (ref 4.0–10.5)
nRBC: 0.1 % (ref 0.0–0.2)

## 2020-03-15 LAB — CULTURE, RESPIRATORY W GRAM STAIN

## 2020-03-15 LAB — MAGNESIUM: Magnesium: 2.3 mg/dL (ref 1.7–2.4)

## 2020-03-15 LAB — PHOSPHORUS: Phosphorus: 4 mg/dL (ref 2.5–4.6)

## 2020-03-15 MED ORDER — FUROSEMIDE 10 MG/ML IJ SOLN
40.0000 mg | Freq: Once | INTRAMUSCULAR | Status: AC
  Administered 2020-03-15: 40 mg via INTRAVENOUS
  Filled 2020-03-15: qty 4

## 2020-03-15 MED ORDER — LABETALOL HCL 5 MG/ML IV SOLN
5.0000 mg | INTRAVENOUS | Status: DC | PRN
  Administered 2020-03-15 – 2020-03-18 (×11): 5 mg via INTRAVENOUS
  Filled 2020-03-15 (×9): qty 4

## 2020-03-15 MED ORDER — DEXMEDETOMIDINE HCL IN NACL 200 MCG/50ML IV SOLN
0.4000 ug/kg/h | INTRAVENOUS | Status: DC
  Administered 2020-03-15: 23:00:00 0.5 ug/kg/h via INTRAVENOUS
  Administered 2020-03-15 – 2020-03-16 (×2): 0.4 ug/kg/h via INTRAVENOUS
  Administered 2020-03-16: 16:00:00 0.7 ug/kg/h via INTRAVENOUS
  Administered 2020-03-16: 20:00:00 1 ug/kg/h via INTRAVENOUS
  Administered 2020-03-16: 0.5 ug/kg/h via INTRAVENOUS
  Administered 2020-03-16: 13:00:00 0.7 ug/kg/h via INTRAVENOUS
  Administered 2020-03-17: 1.2 ug/kg/h via INTRAVENOUS
  Filled 2020-03-15: qty 50
  Filled 2020-03-15: qty 100
  Filled 2020-03-15 (×2): qty 50
  Filled 2020-03-15: qty 100
  Filled 2020-03-15 (×2): qty 50

## 2020-03-15 MED ORDER — METHYLPREDNISOLONE SODIUM SUCC 40 MG IJ SOLR
20.0000 mg | Freq: Every day | INTRAMUSCULAR | Status: DC
  Administered 2020-03-15 – 2020-03-16 (×2): 20 mg via INTRAVENOUS
  Filled 2020-03-15: qty 1

## 2020-03-15 MED ORDER — INSULIN ASPART 100 UNIT/ML ~~LOC~~ SOLN
0.0000 [IU] | SUBCUTANEOUS | Status: DC
  Administered 2020-03-15: 3 [IU] via SUBCUTANEOUS
  Administered 2020-03-15: 17:00:00 2 [IU] via SUBCUTANEOUS
  Administered 2020-03-15: 20:00:00 1 [IU] via SUBCUTANEOUS
  Administered 2020-03-16 (×2): 3 [IU] via SUBCUTANEOUS

## 2020-03-15 MED ORDER — PROPOFOL 1000 MG/100ML IV EMUL: 5.0000 ug/kg/min | INTRAVENOUS | Status: DC

## 2020-03-15 MED ORDER — TRAVASOL 10 % IV SOLN
INTRAVENOUS | Status: AC
  Filled 2020-03-15: qty 1008

## 2020-03-15 MED ORDER — PREDNISONE 5 MG/5ML PO SOLN
20.0000 mg | Freq: Every day | ORAL | Status: DC
  Filled 2020-03-15: qty 20

## 2020-03-15 MED ORDER — SODIUM CHLORIDE 0.9 % IV SOLN
1.0000 g | Freq: Two times a day (BID) | INTRAVENOUS | Status: DC
  Administered 2020-03-15 – 2020-03-17 (×5): 1 g via INTRAVENOUS
  Filled 2020-03-15 (×6): qty 1

## 2020-03-15 MED ORDER — POTASSIUM CHLORIDE 10 MEQ/50ML IV SOLN
10.0000 meq | INTRAVENOUS | Status: AC
  Administered 2020-03-15 (×2): 10 meq via INTRAVENOUS
  Filled 2020-03-15 (×2): qty 50

## 2020-03-15 MED ORDER — ALBUMIN HUMAN 25 % IV SOLN
50.0000 g | Freq: Once | INTRAVENOUS | Status: AC
  Administered 2020-03-15: 10:00:00 50 g via INTRAVENOUS
  Filled 2020-03-15: qty 200

## 2020-03-15 NOTE — Progress Notes (Signed)
3 Days Post-Op   Subjective/Chief Complaint: ON VENT  Husband at the bedside  On vent sedated    Objective: Vital signs in last 24 hours: Temp:  [98 F (36.7 C)-98.9 F (37.2 C)] 98.2 F (36.8 C) (04/02 0834) Pulse Rate:  [86-105] 96 (04/02 0830) Resp:  [10-29] 22 (04/02 0830) BP: (103-161)/(55-68) 103/56 (04/02 0600) SpO2:  [89 %-100 %] 95 % (04/02 0830) Arterial Line BP: (107-203)/(50-80) 127/67 (04/02 0830) FiO2 (%):  [30 %] 30 % (04/02 0826) Weight:  [75.2 kg] 75.2 kg (04/02 0500) Last BM Date: 03/14/20  Intake/Output from previous day: 04/01 0701 - 04/02 0700 In: 977.1 [I.V.:651; IV Piggyback:326] Out: 1540 [Urine:1000; Emesis/NG output:150; Drains:65; Stool:325] Intake/Output this shift: Total I/O In: 250.4 [I.V.:250.4] Out: -   Incision/Wound:open some fibrinous exudate noted  Ostomy pink viable / stool in bag  Some serous drainage noted   Lab Results:  Recent Labs    03/13/20 1141 03/14/20 0544  WBC 26.7* 24.6*  HGB 10.1* 9.3*  HCT 30.8* 28.1*  PLT 240 233   BMET Recent Labs    03/14/20 0544 03/15/20 0348  NA 142 147*  K 3.7 3.3*  CL 110 116*  CO2 19* 21*  GLUCOSE 101* 180*  BUN 84* 70*  CREATININE 2.27* 1.62*  CALCIUM 8.0* 8.0*   PT/INR No results for input(s): LABPROT, INR in the last 72 hours. ABG Recent Labs    03/12/20 1513 03/12/20 1738  PHART 7.324* 7.277*  HCO3 22.8 21.7    Studies/Results: DG CHEST PORT 1 VIEW  Result Date: 03/15/2020 CLINICAL DATA:  Respiratory failure. EXAM: PORTABLE CHEST 1 VIEW COMPARISON:  03/12/2020 FINDINGS: Endotracheal tube, NG tube, left IJ line in stable position. Heart size normal. Persistent right perihilar atelectasis. Continued follow-up exams to demonstrate clearing to exclude underlying mass lesion suggested. Persistent unchanged right upper lobe infiltrate. COPD. No pleural effusion. No pneumothorax. Heart size normal. IMPRESSION: 1.  Lines and tubes stable position. 2. Persistent right  perihilar atelectasis. Continued follow-up exams suggested to demonstrate clearing and to exclude underlying mass lesion. Persistent unchanged right upper lobe infiltrate. COPD. Electronically Signed   By: Marcello Moores  Register   On: 03/15/2020 05:37    Anti-infectives: Anti-infectives (From admission, onward)   Start     Dose/Rate Route Frequency Ordered Stop   03/14/20 1000  anidulafungin (ERAXIS) 100 mg in sodium chloride 0.9 % 100 mL IVPB     100 mg 78 mL/hr over 100 Minutes Intravenous Every 24 hours 03/13/20 0752     03/13/20 0900  anidulafungin (ERAXIS) 200 mg in sodium chloride 0.9 % 200 mL IVPB     200 mg 78 mL/hr over 200 Minutes Intravenous  Once 03/13/20 0752 03/13/20 1241   03/11/20 2000  meropenem (MERREM) 500 mg in sodium chloride 0.9 % 100 mL IVPB     500 mg 200 mL/hr over 30 Minutes Intravenous Every 12 hours 03/11/20 1658     03/11/20 1200  piperacillin-tazobactam (ZOSYN) IVPB 2.25 g  Status:  Discontinued     2.25 g 100 mL/hr over 30 Minutes Intravenous Every 6 hours 03/11/20 1058 03/11/20 1641   03/06/20 1200  piperacillin-tazobactam (ZOSYN) IVPB 3.375 g  Status:  Discontinued     3.375 g 12.5 mL/hr over 240 Minutes Intravenous Every 8 hours 03/06/20 0739 03/11/20 1058   03/05/20 1400  piperacillin-tazobactam (ZOSYN) IVPB 2.25 g  Status:  Discontinued     2.25 g 100 mL/hr over 30 Minutes Intravenous Every 8 hours 03/05/20 0752 03/06/20 0739  03/04/20 2200  piperacillin-tazobactam (ZOSYN) IVPB 3.375 g  Status:  Discontinued     3.375 g 12.5 mL/hr over 240 Minutes Intravenous Every 8 hours 03/03/20 2032 03/05/20 0752   03/03/20 1545  cefoTEtan (CEFOTAN) 2 g in sodium chloride 0.9 % 100 mL IVPB     2 g 200 mL/hr over 30 Minutes Intravenous On call to O.R. 03/03/20 1532 03/04/20 0444   03/03/20 1345  piperacillin-tazobactam (ZOSYN) IVPB 3.375 g     3.375 g 12.5 mL/hr over 240 Minutes Intravenous  Once 03/03/20 1330 03/03/20 1507      Assessment/Plan: s/p  Procedure(s): EXPLORATORY LAPAROTOMY WITH BOWEL RESECTION AND COLOSTOMY (N/A) COPD VDRF -vent per CCM, appreciate assistance, VQ scan with low probability for PE, heparin gtt stopped AKI - Cr2.27, UOPimproving ABL anemia -s/p 2 units PRBC 3/30, hgb 9.3 today Septic shock - IV abx and antifungal, on neo this AM    Perforated sigmoid colon Proximal rectal cancer with obstruction S/p emergent Low anterior resection/ colostomy by Dr. Marcello Moores 3/21 S/p exploratory laparotomy and colon resection with transverse colostomy 3/31 Dr. Hassell Done  - continue daily dressing changes - WOC following for ostomy teaching - stomarevised yesterday, somewhat dusky but viable --start TPN today, prealbumin 6.5 today, Clamping trials with OGT - if residuals low may be able to start some trickle TF   FEN -NPO, clamping OGT, TPN VTE - SCDs,SQ heparin ID - Zosyn3/21>3/29; Merrem 3/29>>, Eraxis 3/31>> Foley -in place  LOS: 12 days    Turner Daniels 03/15/2020

## 2020-03-15 NOTE — Progress Notes (Signed)
NAME:  Tracy [name], MRN:  [id number], DOB:  December 26, 1950, LOS: 12 ADMISSION DATE:  03/03/2020, CONSULTATION DATE:  3/27/021 REFERRING MD: Dr Ninfa Linden of [facility], CHIEF COMPLAINT:  Post op copd consult   Brief History   69 y/o F admitted 3/21 with bowel perforation requiring lower anterior resection and colostomy for newly diagnosed proximal rectal cancer associated with distal sigmoid perforation by Dr. Doreene Adas.  There was no obvious metastatic disease on the parietal peritoneum or liver.  Post-operatively she developed septic shock, RUL HAP requiring intubation (3/27), AKI, post-operative ileus, possible liver lesion, acute delirium.   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 Valeta Harms. Not on O2.  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, metastatic carcinoma in 3/15 lymph nodes  LE [name] 3/27 >> negative for DVT  ECHO 3/27 >> poor windows, normal LV function, severely dilated RV with moderately reduced function, RV volume and pressure overload, mildly dilated RA  V/Q Scan 3/30 >> low probability for PE  Micro Data:  COVID 3/21 >> Negative MRSA PCR 3/21 >> Negative Blood cultures 3/27 >> negative  Urine culture 3/26 >> insignificant growth  Tracheal Aspirate 3/29 >> Saccharomyces cerevisiae Trach aspirate 4/2>>  Antimicrobials:  Zosyn 3/21 >> 3/29 Anidulafungin 3/31 >> Meropenem 3/29 >>  Interim  history/subjective:  Remains off vasopressors.  Required increased sedation overnight for ventilator dyssynchrony.   Objective   Blood pressure (!) 103/56, pulse 96, temperature 98.2 F (36.8 C), temperature source Axillary, resp. rate (!) 22, height 5\' 2"  (1.575 m), weight 75.2 kg, last menstrual period 12/14/2000, SpO2 95 %.    Vent Mode: PSV FiO2 (%):  [30 %] 30 % Set Rate:  [10 bmp] 10 bmp Vt Set:  [400 mL] 400 mL PEEP:  [5 cmH20] 5 cmH20 Pressure Support:  [5 cmH20-10 cmH20] 10 cmH20 Plateau Pressure:  [7 cmH20-12 cmH20] 7 cmH20   Intake/Output Summary (Last 24 hours) at 03/15/2020 1004 Last data filed at 03/15/2020 0830 Gross per 24 hour  Intake 1038.44 ml  Output 1145 ml  Net -106.56 ml   Filed Weights   03/13/20 0500 03/14/20 0500 03/15/20 0500  Weight: 73.5 kg 73.2 kg 75.2 kg    Examination: General: Critically ill-appearing woman lying in bed intubated, heavily sedated HEENT: [place]/AT, eyes anicteric.  Pupils small but reactive.  Oral mucosa moist. Neuro: RASS -5, not responding to nailbed pressure.  Breathing above the vent. CV: Minimally tachycardic, regular rhythm, no murmurs PULM: Breathing above the set rate on the vent.  Appearance unchanged with CPAP plus pressure support compared to [facility].  Achieving adequate tidal volumes in the 400s.  Thick tan secretions from ET tube, new today. Abd: distended, soft, NT. Ostomy pink with small amount of brown stool bloody & purulent appearing drainage from abd JP drain. Extremities: pitting edema throughout, no clubbing or cyanosis Skin: pallor, no rashes or wounds  Resolved Hospital Problem list   Shock  Assessment & Plan:   Acute Hypoxic Respiratory Failure  Baseline Gold stage III/IV COPD   Unlikely PE Suspect multifactorial decompensation with delirium, COPD exacerbation, narcotics, possible aspiration event.  ECHO with RV enlargement, suspect secondary to underlying lung disease.  No prior ECHO for comparison.  Acute  PE ruled out with VQ and negative LE duplex.   -Continue low tidal volume inhalation, 4-8 cc/kg ideal body weight.  Titrate PEEP and FiO2 per ARDS goals. -Fentanyl for sedation to optimize pain control -Daily SAT and SBT.  Previous attempts have been limited due to fentanyl requirements for pain control -Continue bronchodilators.  Steroids previously discontinued. -VAP prevention protocol -Trach aspirate today  Acute Delirium  Noted 3/26 PM, suspect in setting of opioid use, COPD exacerbation and critical illness  -Minimize sedation as much as possible -Pain control.  Would add scheduled acetaminophen once able to take enteral medications -Delirium precautions -PAD protocol for sedation -Goal RASS 0 to -1  Colon cancer with Perforation causing Peritonitis, s/p Resection 3/21 WBC elevated, concern for possible developing abscess  -Continue meropenem and anidulafungin  -appreciate ongoing care from [facility] -Continue to monitor drain output -Continue to follow cultures -Continue TPN   Acute Kidney Injury-improving hypervolemia. Improving, suspect secondary to critical illness, dye load, shock  -Continue to monitor -Strict I/O -Continue albumin -Lasix today. -Renally dose meds, avoid nephrotoxic meds  HTN -labetalol PRN  Moderate Malnutrition -TPN per pharmacy  -defer timing of enteral feeding to [facility]  Acute anemia    In setting of critical illness, blood loss, component of dilution, s/p 2 units PRBC -Continue to monitor -Transfuse for hemoglobin less than 7 or hemodynamically significant bleeding.  Hyperglycemia- controlled -Continue Accu-Cheks every 4 hours with sliding scale insulin as needed. Minimal insulin requirements -Goal BG 140-180 while admitted to the ICU  Best practice:  Diet: TPN Pain/Anxiety/Delirium protocol (if indicated): PAD protocol VAP protocol (if indicated): HOB > 30 DVT prophylaxis: heparin SQ GI prophylaxis: ppi Glucose control: ssi Mobility: bed  rest Code Status: full code Family Communication: Husband updated at bedside 4/2  Disposition: ICU  LABS    PULMONARY Recent Labs  Lab 03/08/20 2240 03/09/20 0545 03/09/20 1005 03/12/20 1513 03/12/20 1738  PHART 7.380 7.305* 7.330* 7.324* 7.277*  PCO2ART 42.0 47.1 46.1 43.3 48.0  PO2ART 67.6* 63.3* 260* 122.0* 75.3*  HCO3 24.4 23.0 23.9 22.8 21.7  TCO2  --   --   --  24  --   O2SAT 92.2 90.2 99.3 99.0 94.4    CBC Recent Labs  Lab 03/13/20 0516 03/13/20 1141 03/14/20 0544  HGB 13.2 10.1* 9.3*  HCT 40.5 30.8* 28.1*  WBC 32.7* 26.7* 24.6*  PLT 325 240 233    COAGULATION Recent Labs  Lab 03/09/20 1752  INR 1.3*    CARDIAC  No results for input(s): TROPONINI in the last 168 hours. No results for input(s): PROBNP in the last 168 hours.   CHEMISTRY Recent Labs  Lab 03/11/20 0500 03/11/20 0500 03/12/20 0433 03/12/20 0433 03/12/20 1513 03/12/20 1513 03/12/20 1757 03/12/20 1757 03/13/20 0516 03/13/20 0516 03/14/20 0544 03/15/20 0348  NA 136   < > 137   < > 134*  --  138  --  139  --  142 147*  K 4.3   < > 3.9   < > 4.1   < > 3.6   < > 3.9   < > 3.7 3.3*  CL 99   < > 101  --   --   --  105  --  110  --  110 116*  CO2 24   < > 25  --   --   --  21*  --  20*  --  19* 21*  GLUCOSE 177*   < > 133*  --   --   --  148*  --  81  --  101* 180*  BUN 72*   < > 86*  --   --   --  80*  --  82*  --  84* 70*  CREATININE 2.93*   < > 2.81*  --   --   --  2.29*  --  2.53*  --  2.27* 1.62*  CALCIUM 7.8*   < > 7.8*  --   --   --  7.6*  --  7.0*  --  8.0* 8.0*  MG 2.5*  --  2.5*  --   --   --   --   --  2.2  --  2.4 2.3  PHOS 5.0*  --  5.3*  --   --   --   --   --  5.1*  --  4.6 4.0   < > = values in this interval not displayed.   Estimated Creatinine Clearance: 32.4 mL/min (A) (by C-G formula based on SCr of 1.62 mg/dL (H)).   LIVER Recent Labs  Lab 03/09/20 1307 03/09/20 1752 03/10/20 0549 03/14/20 0544  AST 429* 440* 282* 27  ALT 131* 152* 148* 27  ALKPHOS  61 57 63 54  BILITOT 1.3* 1.0 0.8 1.1  PROT 4.4* 4.2* 4.7* 5.3*  ALBUMIN 1.6* 1.8* 1.8* 3.5  INR  --  1.3*  --   --      INFECTIOUS Recent Labs  Lab 03/09/20 1307 03/09/20 1749 03/09/20 2343 03/10/20 0549 03/11/20 0500 03/11/20 1057  LATICACIDVEN 2.3*   < > 2.0* 2.0*  --  1.3  PROCALCITON 34.99  --   --  27.18 49.27  --    < > = values in this interval not displayed.     ENDOCRINE CBG (last 3)  Recent Labs    03/14/20 2322 03/15/20 0335 03/15/20 0820  GLUCAP 172* 159* 154*    CRITICAL CARE Performed by: Julian Hy  Total critical care time: 35 minutes  Critical care time was exclusive of separately billable procedures and treating other patients.  Critical care was necessary to treat or prevent imminent or life-threatening deterioration.  Critical care was time spent personally by me on the following activities: development of treatment plan with patient and/or surrogate as well as nursing, discussions with consultants, evaluation of patient's response to treatment, examination of patient, obtaining history from patient or surrogate, ordering and performing treatments and interventions, ordering and review of laboratory studies, ordering and review of radiographic studies, pulse oximetry and re-evaluation of patient's condition.   Noe Gens, MSN, NP-C [facility] 03/15/2020, 10:04 AM   Please see [email or web address] for pager details.

## 2020-03-15 NOTE — Progress Notes (Signed)
Patient has been agitated frequently overnight. Fentanyl has been titrated throughout night as required, as well as bolus' given. IV versed has been given every two hours. This coverage has only be effective for a short amount of time. Patient vital signs indicate discomfort, and ART line BP goes extremely high- charted on flowsheets. I do not feel based on current patient status that I can turn down medication for morning WUA. E-link placed order for patient to receive propofol, I have not given this because I had just given versed, and fentanyl is currently maxed. Will pass on to day shift nurse.

## 2020-03-15 NOTE — Progress Notes (Signed)
PHARMACY - TOTAL PARENTERAL NUTRITION CONSULT NOTE   Indication: Prolonged ileus  Patient Measurements: Height: 5\' 2"  (157.5 cm) Weight: 75.2 kg (165 lb 12.6 oz) IBW/kg (Calculated) : 50.1   Body mass index is 30.32 kg/m.   Assessment:  67 yoF admitted on 3/21 bowel perforation s/p surgery on 3/21 and 3/31.  She remains intubated, on vasopressors, on antibiotic and antifungal coverage.  Tube feeds were given 3/27-3/29, then held.  OGT is still to suction, has bilious drainage.   Pharmacy is consulted to dose TPN.    Glucose / Insulin: CBGs stable - 137, 172, 159, and 154 since TPN started.  Novolog 2-6 units q4h (used 4 units/24 hours) A1c 5.9, no PTA medications Electrolytes: Na 147 up, K 3.3 down (Md ordered 2 IV KCL runs as replacement), Corr Ca slightly low at 8.4 Renal: SCr down to 1.62, BUN down to 70 LFTs / TGs: LFTs WNL / TG 89 Prealbumin / albumin: 6.5 / 3.5 Intake / Output; MIVF: net +2.7L this admit, -56mL net I/O last 24; mIVF NS @KVO  Medications:  MVI, PPI.  Phenylephrine down to 20 mg/min, order for propofol but not started yet continues on fentanyl drip GI Imaging: Surgeries / Procedures:  3/21 emergent Low anterior resection/ colostomy  3/31 exploratory laparotomy and colon resection with transverse colostomy  Central access: CVC triple lumen (placed 3/27) TPN start date: 4/1  Nutritional Goals (per RD recommendation on 4/1): Kcal:  1564, Protein:  127-140, Fluid:  >/= 1.5 L/day Goal TPN rate is 80 mL/hr (provides 134 g of protein and 1516 kcals per day)  While lipids are held for the first 7 days, goal will be to meet 100% of the patient's protein needs and approximately 80% of their caloric needs.  Glucose infusion rate will be 2.7 mg/kg/min (Maximum 5 mg/kg/min)  Note that propofol is ordered on this patient but has not yet been started - continue to monitor  Current Nutrition:  NPO  Plan:   Increase TPN from 40 mL/hr to 60 ml/hr at 1800 Hold lipid  emulsion for first 7 days for critically ill patients per ASPEN guidelines (Start date 4/8)  Electrolytes in TPN: reduce Na to 45mEq/L, increase to 39mEq/L of K, increase to 7.2mEq/L of Ca, 88mEq/L of Mg, and decreased to 10 mmol/L of Phos. Cl:Ac ratio - max Ac  Add standard MVI to TPN on MWF only   Add standard trace elements to TPN daily  Continue SSI q4h but will change to sensitive scale  Monitor TPN labs on Mon/Thurs; Bmet, mag, phos daily x3 days.   Adrian Saran, PharmD, BCPS 03/15/2020 10:34 AM

## 2020-03-15 NOTE — Progress Notes (Signed)
[facility] Progress Note Patient Name: Tracy [name] DOB: 1951/08/02 MRN: [id number]   Date of Service  03/15/2020  HPI/Events of Note  K 3.3. Cr 1.62 (improving trend). Making 500cc urine per shift the last two shifts.   eICU Interventions  Ordered KCl 20 mEq IV via central line.     Intervention Category Intermediate Interventions: Electrolyte abnormality - evaluation and management  Charlott Rakes 03/15/2020, 6:55 AM

## 2020-03-15 NOTE — Progress Notes (Signed)
Pharmacy Antibiotic Note  Tracy [name] is a 69 y.o. female admitted on 03/03/2020 with intra-abdominal infection.  Pharmacy has been consulted for meropenem dosing.  Pt is currently POD#12/POD#3 for perforated colon. Pt is currently on day #11 of total antibiotics, day #5 Meropenem and day #3 Eraxis.  Afebrile, WBC down to 24.6, SCr down to 1.62  Plan:  Due to improved renal function, change meropenem from 500mg  IV q12 to 1g IV 12  Follow renal function for necessary dose adjustments.   Height: 5\' 2"  (157.5 cm) Weight: 75.2 kg (165 lb 12.6 oz) IBW/kg (Calculated) : 50.1  Temp (24hrs), Avg:98.4 F (36.9 C), Min:98 F (36.7 C), Max:98.9 F (37.2 C)  Recent Labs  Lab 03/09/20 1307 03/09/20 1749 03/09/20 1752 03/09/20 2343 03/10/20 0549 03/11/20 0500 03/11/20 1057 03/11/20 2231 03/12/20 0433 03/12/20 1757 03/13/20 0516 03/13/20 1141 03/14/20 0544 03/15/20 0348  WBC 26.8*  --    < >  --  36.6*  --   --    < > 20.4* 16.9* 32.7* 26.7* 24.6*  --   CREATININE 1.60*  --    < >  --  2.22*   < >  --   --  2.81* 2.29* 2.53*  --  2.27* 1.62*  LATICACIDVEN 2.3* 2.4*  --  2.0* 2.0*  --  1.3  --   --   --   --   --   --   --    < > = values in this interval not displayed.    Estimated Creatinine Clearance: 32.4 mL/min (A) (by C-G formula based on SCr of 1.62 mg/dL (H)).    Allergies  Allergen Reactions  . Codeine Swelling    Antimicrobials this admission:  3/21 Cefotetan x 1 3/22 Zosyn >> 3/29 3/29 Meropenem >>  3/31 Eraxis >>   Dose adjustments this admission:  3/23 ZEI> 2.25 q8 3/24 2.25 > ZEI  Microbiology results:  3/21 Influenza: neg; Covid: neg 3/21 MRSA PCR neg 3/26 UCx: < 10k colonies 3/27 BC: NGF 3/29 TA: few yeast  Thank you for allowing pharmacy to be a part of this patient's care.  Adrian Saran, PharmD, BCPS 03/15/2020 10:21 AM

## 2020-03-15 NOTE — Progress Notes (Signed)
[facility] Progress Note Patient Name: Tracy [name] DOB: 03/09/1951 MRN: [id number]   Date of Service  03/15/2020  HPI/Events of Note  Ongoing agitation and ventilator dyssynchrony requiring frequent versed pushes and max dose fentanyl drip.    eICU Interventions  Start low dose propofol drip and see if tolerated from a BP standpoint. This may provide more consistent level of sedation than versed pushes, and be able to be stopped in AM for SBT if deemed appropriate by primary team.      Intervention Category Minor Interventions: Agitation / anxiety - evaluation and management  Marily Lente Ravon [place] 03/15/2020, 5:09 AM

## 2020-03-16 ENCOUNTER — Inpatient Hospital Stay (HOSPITAL_COMMUNITY): Payer: Medicare HMO

## 2020-03-16 LAB — BLOOD GAS, ARTERIAL
Acid-Base Excess: 2.9 mmol/L — ABNORMAL HIGH (ref 0.0–2.0)
Bicarbonate: 27.7 mmol/L (ref 20.0–28.0)
FIO2: 30
O2 Saturation: 93.5 %
Patient temperature: 97.5
pCO2 arterial: 45.6 mmHg (ref 32.0–48.0)
pH, Arterial: 7.398 (ref 7.350–7.450)
pO2, Arterial: 70.3 mmHg — ABNORMAL LOW (ref 83.0–108.0)

## 2020-03-16 LAB — COMPREHENSIVE METABOLIC PANEL
ALT: 22 U/L (ref 0–44)
AST: 20 U/L (ref 15–41)
Albumin: 2.8 g/dL — ABNORMAL LOW (ref 3.5–5.0)
Alkaline Phosphatase: 60 U/L (ref 38–126)
Anion gap: 8 (ref 5–15)
BUN: 68 mg/dL — ABNORMAL HIGH (ref 8–23)
CO2: 24 mmol/L (ref 22–32)
Calcium: 8.4 mg/dL — ABNORMAL LOW (ref 8.9–10.3)
Chloride: 116 mmol/L — ABNORMAL HIGH (ref 98–111)
Creatinine, Ser: 1.08 mg/dL — ABNORMAL HIGH (ref 0.44–1.00)
GFR calc Af Amer: >60 mL/min (ref >60)
GFR calc non Af Amer: 53 mL/min — ABNORMAL LOW (ref >60)
Glucose, Bld: 279 mg/dL — ABNORMAL HIGH (ref 70–99)
Potassium: 3.8 mmol/L (ref 3.5–5.1)
Sodium: 148 mmol/L — ABNORMAL HIGH (ref 135–145)
Total Bilirubin: 0.8 mg/dL (ref 0.3–1.2)
Total Protein: 5 g/dL — ABNORMAL LOW (ref 6.5–8.1)

## 2020-03-16 LAB — GLUCOSE, CAPILLARY
Glucose-Capillary: 239 mg/dL — ABNORMAL HIGH (ref 70–99)
Glucose-Capillary: 240 mg/dL — ABNORMAL HIGH (ref 70–99)
Glucose-Capillary: 192 mg/dL — ABNORMAL HIGH (ref 70–99)
Glucose-Capillary: 205 mg/dL — ABNORMAL HIGH (ref 70–99)

## 2020-03-16 LAB — MAGNESIUM: Magnesium: 2.1 mg/dL (ref 1.7–2.4)

## 2020-03-16 LAB — PHOSPHORUS: Phosphorus: 2.6 mg/dL (ref 2.5–4.6)

## 2020-03-16 MED ORDER — POTASSIUM CHLORIDE 10 MEQ/50ML IV SOLN
10.0000 meq | INTRAVENOUS | Status: AC
  Administered 2020-03-16 (×6): 10 meq via INTRAVENOUS
  Filled 2020-03-16 (×6): qty 50

## 2020-03-16 MED ORDER — METHYLPREDNISOLONE SODIUM SUCC 40 MG IJ SOLR
40.0000 mg | Freq: Every day | INTRAMUSCULAR | Status: DC
  Administered 2020-03-17: 40 mg via INTRAVENOUS
  Filled 2020-03-16 (×3): qty 1

## 2020-03-16 MED ORDER — FUROSEMIDE 10 MG/ML IJ SOLN
100.0000 mg | Freq: Four times a day (QID) | INTRAVENOUS | Status: AC
  Administered 2020-03-16 – 2020-03-17 (×2): 100 mg via INTRAVENOUS
  Filled 2020-03-16 (×2): qty 10

## 2020-03-16 MED ORDER — HYDRALAZINE HCL 20 MG/ML IJ SOLN
10.0000 mg | INTRAMUSCULAR | Status: DC | PRN
  Administered 2020-03-16 – 2020-03-20 (×4): 10 mg via INTRAVENOUS
  Filled 2020-03-16 (×4): qty 1

## 2020-03-16 MED ORDER — FUROSEMIDE 10 MG/ML IJ SOLN
40.0000 mg | Freq: Once | INTRAMUSCULAR | Status: AC
  Administered 2020-03-16: 08:00:00 40 mg via INTRAVENOUS
  Filled 2020-03-16: qty 4

## 2020-03-16 MED ORDER — INSULIN GLARGINE 100 UNIT/ML ~~LOC~~ SOLN
12.0000 [IU] | Freq: Every day | SUBCUTANEOUS | Status: DC
  Administered 2020-03-16: 12 [IU] via SUBCUTANEOUS
  Filled 2020-03-16 (×2): qty 0.12

## 2020-03-16 MED ORDER — FUROSEMIDE 10 MG/ML IJ SOLN
60.0000 mg | Freq: Four times a day (QID) | INTRAMUSCULAR | Status: DC
  Administered 2020-03-16: 60 mg via INTRAVENOUS
  Filled 2020-03-16: qty 6

## 2020-03-16 MED ORDER — ALBUMIN HUMAN 25 % IV SOLN
50.0000 g | Freq: Once | INTRAVENOUS | Status: AC
  Administered 2020-03-16: 16:00:00 50 g via INTRAVENOUS
  Filled 2020-03-16: qty 200

## 2020-03-16 MED ORDER — INSULIN ASPART 100 UNIT/ML ~~LOC~~ SOLN
0.0000 [IU] | SUBCUTANEOUS | Status: DC
  Administered 2020-03-16 (×2): 5 [IU] via SUBCUTANEOUS
  Administered 2020-03-16 – 2020-03-17 (×7): 3 [IU] via SUBCUTANEOUS
  Administered 2020-03-17: 2 [IU] via SUBCUTANEOUS
  Administered 2020-03-18: 04:00:00 3 [IU] via SUBCUTANEOUS
  Administered 2020-03-18: 2 [IU] via SUBCUTANEOUS
  Administered 2020-03-18 (×2): 3 [IU] via SUBCUTANEOUS
  Administered 2020-03-18: 20:00:00 5 [IU] via SUBCUTANEOUS
  Administered 2020-03-18: 3 [IU] via SUBCUTANEOUS
  Administered 2020-03-19 (×4): 2 [IU] via SUBCUTANEOUS
  Administered 2020-03-19: 3 [IU] via SUBCUTANEOUS
  Administered 2020-03-20 – 2020-03-21 (×3): 2 [IU] via SUBCUTANEOUS
  Administered 2020-03-21: 5 [IU] via SUBCUTANEOUS
  Administered 2020-03-21: 3 [IU] via SUBCUTANEOUS
  Administered 2020-03-21 – 2020-03-22 (×4): 2 [IU] via SUBCUTANEOUS
  Administered 2020-03-22: 3 [IU] via SUBCUTANEOUS
  Administered 2020-03-22 (×2): 2 [IU] via SUBCUTANEOUS
  Administered 2020-03-22 – 2020-03-23 (×3): 3 [IU] via SUBCUTANEOUS
  Administered 2020-03-23 (×2): 2 [IU] via SUBCUTANEOUS
  Administered 2020-03-23 (×2): 3 [IU] via SUBCUTANEOUS
  Administered 2020-03-24 – 2020-03-25 (×9): 2 [IU] via SUBCUTANEOUS
  Administered 2020-03-25: 3 [IU] via SUBCUTANEOUS
  Administered 2020-03-26 – 2020-04-01 (×13): 2 [IU] via SUBCUTANEOUS
  Administered 2020-04-01: 1 [IU] via SUBCUTANEOUS
  Administered 2020-04-02: 2 [IU] via SUBCUTANEOUS
  Administered 2020-04-03: 1 [IU] via SUBCUTANEOUS
  Administered 2020-04-04 – 2020-04-05 (×2): 2 [IU] via SUBCUTANEOUS

## 2020-03-16 MED ORDER — ALBUMIN HUMAN 25 % IV SOLN
50.0000 g | Freq: Once | INTRAVENOUS | Status: AC
  Administered 2020-03-16: 13:00:00 50 g via INTRAVENOUS
  Filled 2020-03-16: qty 200

## 2020-03-16 MED ORDER — TRAVASOL 10 % IV SOLN
INTRAVENOUS | Status: AC
  Filled 2020-03-16: qty 1344

## 2020-03-16 NOTE — Progress Notes (Signed)
ABG obtained on PRVC 400, RR 10, Peep 5 and 30% Fio2  Results for Tracy [name], Tracy [name] (MRN [id number]) as of 03/16/2020 23:07  Ref. Range 03/16/2020 22:45  FIO2 Unknown 30.00  pH, Arterial Latest Ref Range: 7.350 - 7.450  7.398  pCO2 arterial Latest Ref Range: 32.0 - 48.0 mmHg 45.6  pO2, Arterial Latest Ref Range: 83.0 - 108.0 mmHg 70.3 (L)  Acid-Base Excess Latest Ref Range: 0.0 - 2.0 mmol/L 2.9 (H)  Bicarbonate Latest Ref Range: 20.0 - 28.0 mmol/L 27.7  O2 Saturation Latest Units: % 93.5  Patient temperature Unknown 97.5  Allens test (pass/fail) Latest Ref Range: PASS  NOT PROVIDED

## 2020-03-16 NOTE — Progress Notes (Signed)
4 Days Post-Op   Subjective/Chief Complaint: ON VENT  Husband at the bedside  On vent sedated     Objective: Vital signs in last 24 hours: Temp:  [97.5 F (36.4 C)-98.2 F (36.8 C)] 97.5 F (36.4 C) (04/02 2333) Pulse Rate:  [64-101] 72 (04/03 0600) Resp:  [14-39] 18 (04/03 0600) BP: (105)/(54) 105/54 (04/02 2039) SpO2:  [94 %-100 %] 97 % (04/03 0600) Arterial Line BP: (108-211)/(55-104) 143/72 (04/03 0600) FiO2 (%):  [30 %] 30 % (04/03 0324) Weight:  [75.7 kg] 75.7 kg (04/03 0500) Last BM Date: 03/15/20  Intake/Output from previous day: 04/02 0701 - 04/03 0700 In: 2304.5 [I.V.:1881.1; IV Piggyback:423.4] Out: 2730 [Urine:2375; Emesis/NG output:275; Drains:30; Stool:50] Intake/Output this shift: No intake/output data recorded.   Incision/Wound:open some fibrinous exudate noted  Ostomy pink viable / stool in bag  Some serous drainage noted  Lab Results:  Recent Labs    03/14/20 0544 03/15/20 1046  WBC 24.6* 17.7*  HGB 9.3* 9.7*  HCT 28.1* 29.0*  PLT 233 316   BMET Recent Labs    03/15/20 0348 03/16/20 0355  NA 147* 148*  K 3.3* 3.8  CL 116* 116*  CO2 21* 24  GLUCOSE 180* 279*  BUN 70* 68*  CREATININE 1.62* 1.08*  CALCIUM 8.0* 8.4*   PT/INR No results for input(s): LABPROT, INR in the last 72 hours. ABG No results for input(s): PHART, HCO3 in the last 72 hours.  Invalid input(s): PCO2, PO2  Studies/Results: DG CHEST PORT 1 VIEW  Result Date: 03/15/2020 CLINICAL DATA:  Respiratory failure. EXAM: PORTABLE CHEST 1 VIEW COMPARISON:  03/12/2020 FINDINGS: Endotracheal tube, NG tube, left IJ line in stable position. Heart size normal. Persistent right perihilar atelectasis. Continued follow-up exams to demonstrate clearing to exclude underlying mass lesion suggested. Persistent unchanged right upper lobe infiltrate. COPD. No pleural effusion. No pneumothorax. Heart size normal. IMPRESSION: 1.  Lines and tubes stable position. 2. Persistent right perihilar  atelectasis. Continued follow-up exams suggested to demonstrate clearing and to exclude underlying mass lesion. Persistent unchanged right upper lobe infiltrate. COPD. Electronically Signed   By: Marcello Moores  Register   On: 03/15/2020 05:37    Anti-infectives: Anti-infectives (From admission, onward)   Start     Dose/Rate Route Frequency Ordered Stop   03/15/20 2200  meropenem (MERREM) 1 g in sodium chloride 0.9 % 100 mL IVPB     1 g 200 mL/hr over 30 Minutes Intravenous Every 12 hours 03/15/20 1021     03/14/20 1000  anidulafungin (ERAXIS) 100 mg in sodium chloride 0.9 % 100 mL IVPB     100 mg 78 mL/hr over 100 Minutes Intravenous Every 24 hours 03/13/20 0752     03/13/20 0900  anidulafungin (ERAXIS) 200 mg in sodium chloride 0.9 % 200 mL IVPB     200 mg 78 mL/hr over 200 Minutes Intravenous  Once 03/13/20 0752 03/13/20 1241   03/11/20 2000  meropenem (MERREM) 500 mg in sodium chloride 0.9 % 100 mL IVPB  Status:  Discontinued     500 mg 200 mL/hr over 30 Minutes Intravenous Every 12 hours 03/11/20 1658 03/15/20 1021   03/11/20 1200  piperacillin-tazobactam (ZOSYN) IVPB 2.25 g  Status:  Discontinued     2.25 g 100 mL/hr over 30 Minutes Intravenous Every 6 hours 03/11/20 1058 03/11/20 1641   03/06/20 1200  piperacillin-tazobactam (ZOSYN) IVPB 3.375 g  Status:  Discontinued     3.375 g 12.5 mL/hr over 240 Minutes Intravenous Every 8 hours 03/06/20 0739 03/11/20  1058   03/05/20 1400  piperacillin-tazobactam (ZOSYN) IVPB 2.25 g  Status:  Discontinued     2.25 g 100 mL/hr over 30 Minutes Intravenous Every 8 hours 03/05/20 0752 03/06/20 0739   03/04/20 2200  piperacillin-tazobactam (ZOSYN) IVPB 3.375 g  Status:  Discontinued     3.375 g 12.5 mL/hr over 240 Minutes Intravenous Every 8 hours 03/03/20 2032 03/05/20 0752   03/03/20 1545  cefoTEtan (CEFOTAN) 2 g in sodium chloride 0.9 % 100 mL IVPB     2 g 200 mL/hr over 30 Minutes Intravenous On call to O.R. 03/03/20 1532 03/04/20 0444   03/03/20  1345  piperacillin-tazobactam (ZOSYN) IVPB 3.375 g     3.375 g 12.5 mL/hr over 240 Minutes Intravenous  Once 03/03/20 1330 03/03/20 1507      Assessment/Plan: s/p Procedure(s): EXPLORATORY LAPAROTOMY WITH BOWEL RESECTION AND COLOSTOMY (N/A) COPD VDRF -vent per CCM, appreciate assistance, VQ scan with low probability for PE, heparin gtt stopped AKI - Cr1.08 , UOPimproving ABL anemia -s/p 2 units PRBC 3/30, hgb9.7 today Septic shock - IV abx and antifungal hopefully wean to extubate soon     Perforated sigmoid colon Proximal rectal cancer with obstruction S/p emergent Low anterior resection/ colostomy by Dr. Marcello Moores 3/21 S/p exploratory laparotomy and colon resection with transverse colostomy 3/31 Dr. Hassell Done  - continue daily dressing changes - WOC following for ostomy teaching - stomarevised yesterday, somewhat dusky but viable -- TPN    FEN -NPO,clamping OGT, TPN VTE - SCDs,SQ heparin ID - Zosyn3/21>3/29; Merrem 3/29>>, Eraxis 3/31>> WBC 17,000  Foley -in place  LOS: 13 days    Turner Daniels MD  03/16/2020

## 2020-03-16 NOTE — Progress Notes (Signed)
Notified on call of low albumin. No orders placed as of yet, will inform day shift nurse to follow up. PT became agitated despite increase in precedex when rolling for AM bath. BP remained elevated for four minutes, fentanyl bolus given with increase in precedex. Patient opened her eyes slightly during this time. Patient has been a -3 RASS for most of the night until this morning when she required an increase in precedex twice. During this time she was able to move her left hand slightly but quickly became agitated. Her agitation seems to be pain related so I have not given the labatelol as sedation has been effective at decreasing the level of agitation. Will talk to dayshift nurse about possibility of pain medication change as she seems unfazed the last two nights when given fentanyl bolus.

## 2020-03-16 NOTE — Progress Notes (Signed)
NAME:  Tracy [name], MRN:  [id number], DOB:  03-26-1951, LOS: 21 ADMISSION DATE:  03/03/2020, CONSULTATION DATE:  3/27/021 REFERRING MD: Dr Ninfa Linden of [facility], CHIEF COMPLAINT:  Post op copd consult   Brief History   69 y/o F admitted 3/21 with bowel perforation requiring lower anterior resection and colostomy for newly diagnosed proximal rectal cancer associated with distal sigmoid perforation by Dr. Doreene Adas.  There was no obvious metastatic disease on the parietal peritoneum or liver.  Post-operatively she developed septic shock, RUL HAP requiring intubation (3/27), AKI, post-operative ileus, possible liver lesion, acute delirium.   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 Valeta Harms. Not on O2.  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, metastatic carcinoma in 3/15 lymph nodes  LE [name] 3/27 >> negative for DVT  ECHO 3/27 >> poor windows, normal LV function, severely dilated RV with moderately reduced function, RV volume and pressure overload, mildly dilated RA  V/Q Scan 3/30 >> low probability for PE  Micro Data:  COVID 3/21 >> Negative MRSA PCR 3/21 >> Negative Blood cultures 3/27 >> negative  Urine culture 3/26 >> insignificant growth  Tracheal Aspirate 3/29 >> Saccharomyces cerevisiae Trach aspirate  4/2>>  Antimicrobials:  Zosyn 3/21 >> 3/29 Anidulafungin 3/31 >> Meropenem 3/29 >>  Interim history/subjective:  Remains off vasopressors with intermittent hypertension.  Hypothermic this morning.  Tolerated SBT 10/5 yesterday but required high amounts of sedation for pain control.  Continues to appear uncomfortable on the vent in any mode despite excessive sedation.  Her nurses were able to get her to follow a few commands yesterday.  Objective   Blood pressure (!) 189/92, pulse 84, temperature (!) 97.5 F (36.4 C), temperature source Oral, resp. rate (!) 23, height 5\' 2"  (1.575 m), weight 75.7 kg, last menstrual period 12/14/2000, SpO2 97 %. CVP:  [10 mmHg] 10 mmHg  Vent Mode: PRVC FiO2 (%):  [30 %] 30 % Set Rate:  [10 bmp] 10 bmp Vt Set:  [400 mL] 400 mL PEEP:  [5 cmH20] 5 cmH20 Pressure Support:  [10 cmH20] 10 cmH20 Plateau Pressure:  [24 cmH20-26 cmH20] 24 cmH20   Intake/Output Summary (Last 24 hours) at 03/16/2020 1022 Last data filed at 03/16/2020 0900 Gross per 24 hour  Intake 2172.53 ml  Output 2730 ml  Net -557.47 ml   Filed Weights   03/14/20 0500 03/15/20 0500 03/16/20 0500  Weight: 73.2 kg 75.2 kg 75.7 kg    Examination: General: Critically ill-appearing woman lying in bed intubated, heavily sedated. HEENT: [place]/AT, eyes anicteric. Neuro: RASS -5, not responsive to verbal or physical stimulation.  Breathing significantly above the vent. CV: Regular rate and rhythm, no murmurs  PULM: Breathing spontaneously above the vent.  Using abdominal accessory muscles during exhalation.  Faint wheezing left greater than right. abd: Distended, soft, nontender.  Ostomy with brown  stool.  Minimal bloody output from JP drain. Extremities: Pitting edema diffusely, no clubbing or cyanosis. Skin: Pallor, No rashes or wounds.  Resolved Hospital Problem list   Shock    Assessment & Plan:   Acute Hypoxic Respiratory Failure  Baseline GOLD stage III/IV COPD   Unlikely PE Suspect  multifactorial decompensation with delirium, COPD exacerbation, narcotics, possible aspiration event.  ECHO with RV enlargement, suspect secondary to underlying lung disease.  No prior ECHO for comparison.  Acute PE unlikely with low probability VQ and negative LE duplex.   -Con't LTVV, 4-8 cc/kg IBW with goal plateau <30 and driving pressure [id number].  -Continue fentanyl and Precedex for sedation -Daily SAT and SBT.  Limited today by wheezing and excessive work of breathing. -Increasing steroids given persistent wheezing-Solu-Medrol 40 mg daily -Continue bronchodilators every 4 hours -VAP prevention protocol -Continue to follow trach aspirate culture  Colon cancer with perforation causing peritonitis, s/p resection 3/21 WBC elevated, but improving.  High risk large abdominal abscess per surgery. -Continue meropenem and anidulafungin.  -Appreciate ongoing care from [place] to monitor drain output -Continue TPN.  Ongoing high OG output, but improving.  Anasarca -Lasix x3 doses today. -Not responding without albumin; will give 2 doses today  Acute Kidney Injury-improving.  . Improving, suspect secondary to critical illness, dye load, shock  -Continue to monitor -Strict I/Os -Continue albumin given low urine output without it -Continue to need diuresis -Renally dose meds and avoid nephrotoxic medications  HTN -Continue labetalol as needed  Moderate Malnutrition -TPN per pharmacy -We will defer timing of initiating enteral feeding to [facility]. Clamping trials of OG today.  Acute anemia    In setting of critical illness, blood loss, component of dilution, s/p 2 units PRBC -Continue to monitor -Transfuse for hemoglobin less than 7 or hemodynamically significant bleeding.  Hyperglycemia- uncontrolled on steroids -Continue Accu-Cheks every 4 hours with sliding scale insulin as needed-increase to moderate dose -Start insulin glargine 12 units daily. -Goal BG 140-180 while admitted to  the ICU  Acute Delirium  Noted 3/26 PM, suspect in setting of opioid use, COPD exacerbation and critical illness  -Minimize sedation as much as possible -Pain control.  Would add scheduled acetaminophen once able to take enteral medications -Delirium precautions -PAD protocol for sedation -Goal RASS 0 to -1  Best practice:  Diet: TPN Pain/Anxiety/Delirium protocol (if indicated): PAD protocol VAP protocol (if indicated): HOB > 30 DVT prophylaxis: heparin SQ GI prophylaxis: ppi Glucose control: ssi Mobility: bed rest Code Status: full code Family Communication: Husband updated at bedside 4/3  Disposition: ICU  LABS    PULMONARY Recent Labs  Lab 03/12/20 1513 03/12/20 1738  PHART 7.324* 7.277*  PCO2ART 43.3 48.0  PO2ART 122.0* 75.3*  HCO3 22.8 21.7  TCO2 24  --   O2SAT 99.0 94.4    CBC Recent Labs  Lab 03/13/20 1141 03/14/20 0544 03/15/20 1046  HGB 10.1* 9.3* 9.7*  HCT 30.8* 28.1* 29.0*  WBC 26.7* 24.6* 17.7*  PLT 240 233 316    COAGULATION Recent Labs  Lab 03/09/20 1752  INR 1.3*    CARDIAC  No results for input(s): TROPONINI in the last 168 hours. No results for input(s): PROBNP in the last 168 hours.   CHEMISTRY Recent Labs  Lab 03/12/20 [id number] 03/12/20 1513 03/12/20 1757 03/12/20 1757 03/13/20 0516 03/13/20 0516 03/14/20 0544 03/14/20 0544 03/15/20 0348 03/16/20 0355  NA 137   < > 138  --  139  --  142  --  147*  148*  K 3.9   < > 3.6   < > 3.9   < > 3.7   < > 3.3* 3.8  CL 101  --  105  --  110  --  110  --  116* 116*  CO2 25  --  21*  --  20*  --  19*  --  21* 24  GLUCOSE 133*  --  148*  --  81  --  101*  --  180* 279*  BUN 86*  --  80*  --  82*  --  84*  --  70* 68*  CREATININE 2.81*  --  2.29*  --  2.53*  --  2.27*  --  1.62* 1.08*  CALCIUM 7.8*  --  7.6*  --  7.0*  --  8.0*  --  8.0* 8.4*  MG 2.5*  --   --   --  2.2  --  2.4  --  2.3 2.1  PHOS 5.3*  --   --   --  5.1*  --  4.6  --  4.0 2.6   < > = values in this interval not  displayed.   Estimated Creatinine Clearance: 48.8 mL/min (A) (by C-G formula based on SCr of 1.08 mg/dL (H)).   LIVER Recent Labs  Lab 03/09/20 1307 03/09/20 1752 03/10/20 0549 03/14/20 0544 03/16/20 0355  AST 429* 440* 282* 27 20  ALT 131* 152* 148* 27 22  ALKPHOS 61 57 63 54 60  BILITOT 1.3* 1.0 0.8 1.1 0.8  PROT 4.4* 4.2* 4.7* 5.3* 5.0*  ALBUMIN 1.6* 1.8* 1.8* 3.5 2.8*  INR  --  1.3*  --   --   --      INFECTIOUS Recent Labs  Lab 03/09/20 1307 03/09/20 1749 03/09/20 2343 03/10/20 0549 03/11/20 0500 03/11/20 1057  LATICACIDVEN 2.3*   < > 2.0* 2.0*  --  1.3  PROCALCITON 34.99  --   --  27.18 49.27  --    < > = values in this interval not displayed.     ENDOCRINE CBG (last 3)  Recent Labs    03/15/20 2330 03/16/20 0351 03/16/20 0821  GLUCAP 215* 239* 240*   This patient is critically ill with multiple organ system failure which requires frequent high complexity decision making, assessment, support, evaluation, and titration of therapies. This was completed through the application of advanced monitoring technologies and extensive interpretation of multiple databases. During this encounter critical care time was devoted to patient care services described in this note for 38 minutes.   Julian Hy, DO 03/16/20 11:53 AM [facility]

## 2020-03-16 NOTE — Progress Notes (Signed)
New bag of fentanyl spiked and scanned at 1819. Bag noted to be leaking and was wasted with Josph Macho RN. 250 mL wasted into stericycle and new bag hung at 1824.

## 2020-03-16 NOTE — Progress Notes (Signed)
PHARMACY - TOTAL PARENTERAL NUTRITION CONSULT NOTE   Indication: Prolonged ileus  Patient Measurements: Height: 5\' 2"  (157.5 cm) Weight: 75.7 kg (166 lb 14.2 oz) IBW/kg (Calculated) : 50.1   Body mass index is 30.52 kg/m.   Assessment:  57 yoF admitted on 3/21 bowel perforation s/p surgery on 3/21 and 3/31.  She remains intubated, on vasopressors, on antibiotic and antifungal coverage.  Tube feeds were given 3/27-3/29, then held.  OGT is still to suction, has bilious drainage.   Pharmacy is consulted to dose TPN.    Glucose / Insulin: CBGs rising - 104, 157, 150, 215, 239, 240.  Note that Solumedrol 20mg  IV daily started 4/2. A1c 5.9, no PTA medications. SSI used last 24hr: 13 units Electrolytes: Na 148 up, K 3.8 improved and Corr Ca now WNL Renal: SCr down to 1.08, BUN down to 68 LFTs / TGs: LFTs WNL / TG 89 Prealbumin / albumin: 6.5 / 3.5 Intake / Output; MIVF: net +258mL this admit, -462mL net I/O last 24hr with 225 mL NG output; mIVF NS @KVO  Medications:  MVI, PPI.  Phenylephrine down to 20 mg/min, order for propofol but not started yet continues on fentanyl drip GI Imaging: Surgeries / Procedures:  3/21 emergent Low anterior resection/ colostomy  3/31 exploratory laparotomy and colon resection with transverse colostomy 4/3: trial of clamping OGT today per [facility] note  Central access: CVC triple lumen (placed 3/27) TPN start date: 4/1  Nutritional Goals (per RD recommendation on 4/1): Kcal:  1564, Protein:  127-140, Fluid:  >/= 1.5 L/day Goal TPN rate is 80 mL/hr (provides 134 g of protein and 1516 kcals per day)  While lipids are held for the first 7 days, goal will be to meet 100% of the patient's protein needs and approximately 80% of their caloric needs.  Glucose infusion rate will be 2.7 mg/kg/min (Maximum 5 mg/kg/min)  Note that propofol is ordered on this patient but has not yet been started - continue to monitor  Current Nutrition:  NPO  Plan:   Increase TPN  from 60 mL/hr to goal 80 ml/hr at 1800  Hold lipid emulsion for first 7 days for critically ill patients per ASPEN guidelines (Start date 4/8)  Electrolytes in TPN: remove Na, 48mEq/L of K, 7.80mEq/L of Ca, 74mEq/L of Mg, 10 mmol/L of Phos. Cl:Ac ratio - max Ac  Add standard MVI to TPN on MWF only   Add standard trace elements to TPN daily  Continue SSI q4h but will change to moderate scale  Monitor TPN labs on Mon/Thurs; Bmet, mag, phos daily in AM   Adrian Saran, PharmD, BCPS 03/16/2020 8:35 AM

## 2020-03-16 NOTE — Progress Notes (Signed)
[facility] Progress Note Patient Name: Tracy [name] DOB: [birth year] MRN: [id number]   Date of Service  03/16/2020  HPI/Events of Note  Notified of HTn SBP 200s despite Precedex 1.2, Fentanyl 200, Versed push, Labetalol prn and now on furosemide drip Appears adequately sedated  eICU Interventions  Ordered Hydralazine 10 mg IV prn for SBP >170 and continue to monitor. Discussed with bedside RN     Intervention Category Major Interventions: Hypertension - evaluation and management  Judd Lien 03/16/2020, 8:50 PM

## 2020-03-16 NOTE — Progress Notes (Signed)
Patient blood pressure has been elevated since approx. 1730 despite increase in sedation. Fentanyl and Precedex are currently maxed out and prn labatelol and versed have been given and are ineffective. Patient has increase in BP when placed back on ventilator. She weaned today from [phone]. Other vital signs remain stable. I have had this patient for the past two nights and she has not remained extremely hypertensive for more than four minutes until tonight. Patient is unresponsive but both the dayshift nurse and myself agree that she can hear and understand what we are saying while in the room. Any amount of stimulation causes an increase in her blood pressure. Precedex remains the most effective out of the medications we have tried, and I have seen no decrease in discomfort the past few nights from Fentanyl. I talked to the dayshift nurse this morning about it and asked if they would possibly switch her pain medication but did not get an answer to why it wasn't done. Arterial line has been re-zeroed by myself, charge nurse, and respiratory and the reading is correct. The last chest xray was on 15 March 2020 in the morning.

## 2020-03-16 NOTE — Progress Notes (Signed)
Noticed increase in respirations after giving IV hydralazine. Average RR had been 17 before and are now at 30. Patient appears to be more air hungry than she was earlier in the shift as well. I am now seeing frequent PVC's on the monitor. I suctioned the patient and she tolerated it well but I got very little out and did not see any improvement. I spoke to RT Janett Billow and asked her to come look at the patient during her next rounds just to make sure nothing significant has changed from that standpoint. BP has began to rise again, will notify physician if we can not troubleshoot.

## 2020-03-16 NOTE — Progress Notes (Signed)
[facility] Progress Note Patient Name: Tracy [name] DOB: Mar 05, 1951 MRN: [id number]   Date of Service  03/16/2020  HPI/Events of Note  Notified of increased work of breathing BP better after hydralazine  eICU Interventions  Ordered CXR and ABG Discussed with bedside. Call [place] once tests are done     Intervention Category Major Interventions: Respiratory failure - evaluation and management  Judd Lien 03/16/2020, 10:40 PM

## 2020-03-17 LAB — GLUCOSE, CAPILLARY
Glucose-Capillary: 193 mg/dL — ABNORMAL HIGH (ref 70–99)
Glucose-Capillary: 211 mg/dL — ABNORMAL HIGH (ref 70–99)
Glucose-Capillary: 193 mg/dL — ABNORMAL HIGH (ref 70–99)
Glucose-Capillary: 168 mg/dL — ABNORMAL HIGH (ref 70–99)
Glucose-Capillary: 150 mg/dL — ABNORMAL HIGH (ref 70–99)
Glucose-Capillary: 176 mg/dL — ABNORMAL HIGH (ref 70–99)
Glucose-Capillary: 176 mg/dL — ABNORMAL HIGH (ref 70–99)
Glucose-Capillary: 171 mg/dL — ABNORMAL HIGH (ref 70–99)

## 2020-03-17 LAB — BASIC METABOLIC PANEL
Anion gap: 11 (ref 5–15)
BUN: 79 mg/dL — ABNORMAL HIGH (ref 8–23)
CO2: 27 mmol/L (ref 22–32)
Calcium: 9.5 mg/dL (ref 8.9–10.3)
Chloride: 112 mmol/L — ABNORMAL HIGH (ref 98–111)
Creatinine, Ser: 1.03 mg/dL — ABNORMAL HIGH (ref 0.44–1.00)
GFR calc Af Amer: >60 mL/min (ref >60)
GFR calc non Af Amer: 57 mL/min — ABNORMAL LOW (ref >60)
Glucose, Bld: 186 mg/dL — ABNORMAL HIGH (ref 70–99)
Potassium: 4.5 mmol/L (ref 3.5–5.1)
Sodium: 150 mmol/L — ABNORMAL HIGH (ref 135–145)

## 2020-03-17 LAB — PHOSPHORUS: Phosphorus: 2.4 mg/dL — ABNORMAL LOW (ref 2.5–4.6)

## 2020-03-17 LAB — MAGNESIUM: Magnesium: 2 mg/dL (ref 1.7–2.4)

## 2020-03-17 LAB — TRIGLYCERIDES: Triglycerides: 69 mg/dL (ref <150)

## 2020-03-17 MED ORDER — BUDESONIDE 0.5 MG/2ML IN SUSP
0.5000 mg | Freq: Two times a day (BID) | RESPIRATORY_TRACT | Status: DC
  Administered 2020-03-17 – 2020-04-05 (×38): 0.5 mg via RESPIRATORY_TRACT
  Filled 2020-03-17 (×39): qty 2

## 2020-03-17 MED ORDER — INSULIN GLARGINE 100 UNIT/ML ~~LOC~~ SOLN
8.0000 [IU] | Freq: Every day | SUBCUTANEOUS | Status: DC
  Administered 2020-03-17 – 2020-03-18 (×2): 8 [IU] via SUBCUTANEOUS
  Filled 2020-03-17 (×3): qty 0.08

## 2020-03-17 MED ORDER — PROPOFOL 1000 MG/100ML IV EMUL
5.0000 ug/kg/min | INTRAVENOUS | Status: DC
  Administered 2020-03-17: 09:00:00 25 ug/kg/min via INTRAVENOUS
  Administered 2020-03-17: 21:00:00 35 ug/kg/min via INTRAVENOUS
  Administered 2020-03-17: 5 ug/kg/min via INTRAVENOUS
  Administered 2020-03-17 – 2020-03-18 (×2): 35 ug/kg/min via INTRAVENOUS
  Filled 2020-03-17 (×5): qty 100

## 2020-03-17 MED ORDER — TRAVASOL 10 % IV SOLN
INTRAVENOUS | Status: AC
  Filled 2020-03-17: qty 1305.6

## 2020-03-17 MED ORDER — ARFORMOTEROL TARTRATE 15 MCG/2ML IN NEBU
15.0000 ug | INHALATION_SOLUTION | Freq: Two times a day (BID) | RESPIRATORY_TRACT | Status: DC
  Administered 2020-03-17 – 2020-04-05 (×38): 15 ug via RESPIRATORY_TRACT
  Filled 2020-03-17 (×39): qty 2

## 2020-03-17 NOTE — Progress Notes (Addendum)
PHARMACY - TOTAL PARENTERAL NUTRITION CONSULT NOTE   Indication: Prolonged ileus  Patient Measurements: Height: 5\' 2"  (157.5 cm) Weight: 73.1 kg (161 lb 2.5 oz) IBW/kg (Calculated) : 50.1   Body mass index is 29.48 kg/m.   Assessment:  30 yoF admitted on 3/21 bowel perforation s/p surgery on 3/21 and 3/31.  She remains intubated, on vasopressors, on antibiotic and antifungal coverage.  Tube feeds were given 3/27-3/29, then held.  OGT is still to suction, has bilious drainage.   Pharmacy is consulted to dose TPN.    Glucose / Insulin: CBGs last 24hr appear to be trending down - 240, 192, 205, 211, 193, 193, 168.  CCM Md added Lantus 12 units daily on 4/3. Note that Solumedrol increased from 20mg  daily to 40mg  IV daily started 4/4. A1c 5.9, no PTA medications. SSI used last 24hr: 25 units and note that SSI changed from sens to mod scale 4/3 Electrolytes: Na 150 up despite no Na in TPN currently, Phos just below normal range Renal: SCr down to 1.03, BUN 79 rising LFTs / TGs: LFTs WNL / TG 89 Prealbumin / albumin: 6.5 / 3.5 Intake / Output; MIVF: net -1170mL this admit, -2137mL net I/O last 24hr with 0 mL NG outpu and 150 mL stool outputt; mIVF NS @KVO  Medications:  MVI, PPI, Propofol started early 4/4 and currently running at rate of 25 mcg/kg/min GI Imaging: Surgeries / Procedures:  3/21 emergent Low anterior resection/ colostomy  3/31 exploratory laparotomy and colon resection with transverse colostomy 4/3: trial of clamping OGT today per [facility] note  Central access: CVC triple lumen (placed 3/27) TPN start date: 4/1  Nutritional Goals (per RD recommendation on 4/1): Kcal:  1564, Protein:  127-140, Fluid:  >/= 1.5 L/day Goal TPN rate is 80 mL/hr (see below for new totals while pt on propofol)  While lipids are held for the first 7 days, goal will be to meet 100% of the patient's protein needs and approximately 80% of their caloric needs.  Glucose infusion rate will be at 2.1  mg/kg/min with new  (Maximum 5 mg/kg/min)  Note that propofol started 4/4 - see below  Current Nutrition:  NPO  Plan:   Cotinue TPN at goal 80 ml/hr at 1800  Hold lipid emulsion for first 7 days for critically ill patients per ASPEN guidelines (Start date 4/8); however, propofol at current rate of 11.36 ml/hr provides 273kcal/day so will need to adjust TPN formula to factor in propofol administration - will reduce protein to 68g/L (130 g/day) and reduce dextrose from 15% to 12% - total kcal from TPN + propofol will therefore be 1578 kcal/day  Electrolytes in TPN: 0 Na, 63mEq/L of K, 7.23mEq/L of Ca, 84mEq/L of Mg, increase to 15 mmol/L of Phos. Cl:Ac ratio - max Ac  Add standard MVI to TPN on MWF only   Add standard trace elements to TPN daily  Continue SSI q4h moderate scale but as a result of decrease in dextrose amount in TPN, will decrease Lantus from 12 to 8 units daily  Monitor TPN labs on Mon/Thurs and as needed  With continued rise in Na, would defer to Md for management outside of TPN since no Na currently in [place], PharmD, BCPS 03/17/2020 8:56 AM

## 2020-03-17 NOTE — Progress Notes (Signed)
NAME:  Tracy [name], MRN:  [id number], DOB:  1951/04/24, LOS: 71 ADMISSION DATE:  03/03/2020, CONSULTATION DATE:  3/27  REFERRING MD:  Ninfa Linden, CHIEF COMPLAINT:  Abdominal pain   Brief History   69 y/o female admitted 3/21 with bowel perforation requiring colostomy and lower anterior resection in setting of newly diagnosed proximal rectal cancer with sigmoid perforation.  Post op developed septic shock, HCAP AKI ileus and acute hypoxemic respiratory failure requiring intubation.   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 Valeta Harms. Not on O2.  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, metastatic carcinoma in 3/15 lymph nodes  LE [name] 3/27 >> negative for DVT  ECHO 3/27 >> poor windows, normal LV function, severely dilated RV with moderately reduced function, RV volume and pressure overload, mildly dilated RA  V/Q Scan 3/30 >> low probability for PE  Micro Data:  COVID 3/21 >> Negative MRSA PCR 3/21 >> Negative Blood cultures 3/27 >> negative  Urine culture 3/26 >> insignificant growth  Tracheal Aspirate 3/29 >> Saccharomyces cerevisiae Trach aspirate 4/2>>  Antimicrobials:  Zosyn 3/21  >> 3/29 Anidulafungin 3/31 >> Meropenem 3/29 >>   Interim history/subjective:  Weaned 8 hours yesterday 3 hours today Agitation when back on full support on vent  Objective   Blood pressure (!) 180/67, pulse 93, temperature 98.6 F (37 C), temperature source Axillary, resp. rate (!) 22, height 5\' 2"  (1.575 m), weight 73.1 kg, last menstrual period 12/14/2000, SpO2 95 %.    Vent Mode: PSV;CPAP FiO2 (%):  [30 %] 30 % Set Rate:  [10 bmp] 10 bmp Vt Set:  [400 mL] 400 mL PEEP:  [5 cmH20] 5 cmH20 Pressure Support:  [5 cmH20-8 cmH20] 8 cmH20 Plateau Pressure:  [15 cmH20-16 cmH20] 16 cmH20   Intake/Output Summary (Last 24 hours) at 03/17/2020 0912 Last data filed at 03/17/2020 0600 Gross per 24 hour  Intake 3346.88 ml  Output 5705 ml  Net -2358.12 ml   Filed Weights   03/15/20 0500 03/16/20 0500 03/17/20 0400  Weight: 75.2 kg 75.7 kg 73.1 kg    Examination: General:  In bed on vent HENT: NCAT ETT in place PULM: CTA B, vent supported breathing CV: RRR, no mgr GI: BS+, soft, non-tender, ostomy dusky MSK: normal bulk and tone Neuro: sedated on vent   4/3 CXR images personally reviewed> emphysema bilaterally, ETT in place  Resolved Hospital Problem list     Assessment & Plan:  Acute hypoxemic respiratory failure Baseline GOLD III/IV COPD Full mechanical vent support VAP prevention Daily WUA/SBT Pressure support as tolerated Add budesonide nebulized and brovana Continue ipratropium/albuterol scheduled Solumedrol scheduled, taper this week  Colon cancer Per general surgery  Anasarca, neg negative on 4/4, exam  likely improved Hypernatremia Hold lasix Monitor BMET and UOP Replace electrolytes as needed Pharmacy to adjust free water in TPN  AKI > improving Hold albumin Monitor BMET and UOP Replace electrolytes as needed  Hypertension monitor  Moderate Malnutrition TPN  Acute anemia, stable 4/4 without bleeding Monitor for bleeding Transfuse PRBC for Hgb <  7 gm/dL  Hyperglycemia SSI + glargine  Need for sedation for mechanical ventilation RASS goal -1 to -2 PAD protocol fentanyl infusion and propofol  Prognosis guarded  Best practice:  Diet: TPN Pain/Anxiety/Delirium protocol (if indicated): yes, as above VAP protocol (if indicated): yes DVT prophylaxis: sub q heparin GI prophylaxis: PPI Glucose control: as above Mobility: bed rest Code Status: full Family Communication: per general surgery Disposition: remain in ICU  Labs   CBC: Recent Labs  Lab 03/12/20 1757 03/13/20 0516 03/13/20 1141 03/14/20 0544 03/15/20 1046  WBC 16.9* 32.7* 26.7* 24.6* 17.7*  NEUTROABS  --   --   --  23.4* 16.3*  HGB 12.1 13.2 10.1* 9.3* 9.7*  HCT 37.1 40.5 30.8* 28.1* 29.0*  MCV 95.4 94.4 93.6 94.6 93.2  PLT 289 325 240 233 [id number]    Basic Metabolic Panel: Recent Labs  Lab 03/13/20 0516 03/14/20 0544 03/15/20 0348 03/16/20 0355 03/17/20 0226  NA 139 142 147* 148* 150*  K 3.9 3.7 3.3* 3.8 4.5  CL 110 110 116* 116* 112*  CO2 20* 19* 21* 24 27  GLUCOSE 81 101* 180* 279* 186*  BUN 82* 84* 70* 68* 79*  CREATININE 2.53* 2.27* 1.62* 1.08* 1.03*  CALCIUM 7.0* 8.0* 8.0* 8.4* 9.5  MG 2.2 2.4 2.3 2.1 2.0  PHOS 5.1* 4.6 4.0 2.6 2.4*   GFR: Estimated Creatinine Clearance: 50.3 mL/min (A) (by C-G formula based on SCr of 1.03 mg/dL (H)). Recent Labs  Lab 03/11/20 0500 03/11/20 1057 03/11/20 2231 03/13/20 0516 03/13/20 1141 03/14/20 0544 03/15/20 1046  PROCALCITON 49.27  --   --   --   --   --   --   WBC  --   --    < > 32.7* 26.7* 24.6* 17.7*  LATICACIDVEN  --  1.3  --   --   --   --   --    < > = values in this interval not displayed.    Liver Function Tests: Recent Labs  Lab 03/14/20 0544 03/16/20 0355  AST 27 20  ALT 27 22  ALKPHOS 54 60  BILITOT 1.1 0.8  PROT 5.3* 5.0*  ALBUMIN 3.5 2.8*   No results for input(s): LIPASE, AMYLASE in the last 168 hours. No results for input(s): AMMONIA in the last 168 hours.  ABG     Component Value Date/Time   PHART 7.398 03/16/2020 2245   PCO2ART 45.6 03/16/2020 2245   PO2ART 70.3 (L) 03/16/2020 2245   HCO3 27.7 03/16/2020 2245   TCO2 24 03/12/2020 1513   ACIDBASEDEF 4.8 (H) 03/12/2020 1738   O2SAT 93.5 03/16/2020 2245     Coagulation Profile: No results for input(s): INR, PROTIME in the last 168 hours.  Cardiac Enzymes: No results for input(s): CKTOTAL, CKMB, CKMBINDEX, TROPONINI in the last 168 hours.  HbA1C: Hgb A1c MFr Bld  Date/Time Value Ref Range Status  03/11/2020 05:00 AM 5.9 (H) 4.8 - 5.6 % Final    Comment:    (NOTE) Pre diabetes:          5.7%-6.4% Diabetes:              >6.4% Glycemic control  for   <7.0% adults with diabetes     CBG: Recent Labs  Lab 03/16/20 1549 03/16/20 2016 03/17/20 0001 03/17/20 0359 03/17/20 0735  GLUCAP 205* 211* 193* 193* 168*     Critical care time: 35 minutes     Roselie Awkward, MD [facility] Pager: [phone] Cell: [phone] If no response, call [phone]

## 2020-03-17 NOTE — Progress Notes (Addendum)
PRN Hydralazine given at 0055 and appeared to effective. Patient SBP decreased to the 140's and MAP was 68. Ventilator and BP alarm sounded and patient found to be back into the upper 180's with the highest RR recorded above 35. When I entered the room the patients breathing was even more labored and her eyes were open. I immediately gave her a fentanyl bolus and checked lines to ensure patency. Patient not able to squeeze my hand at this time but that is probably due to weakness. Patients gaze followed me across the room until bolus appeared to be effective. Work of breathing is still increased, sedation meds continue to be maxed. Called E link to ask for another form of sedation. Awaiting orders.  0142: Patient still awake, I am staying in the room with her until notified by E-Link. Attemped to do mouth care and patient clamped her teeth around her ET tube. When I talk to her her nostrils flare, and her eyes tear. Gave a larger bolus. Awaiting effectiveness.

## 2020-03-17 NOTE — Progress Notes (Signed)
5 Days Post-Op   Subjective/Chief Complaint: On vent    Objective: Vital signs in last 24 hours: Temp:  [97.5 F (36.4 C)-98.7 F (37.1 C)] 97.7 F (36.5 C) (04/04 0500) Pulse Rate:  [65-94] 80 (04/04 0600) Resp:  [16-42] 22 (04/04 0600) BP: (162-208)/(66-92) 180/67 (04/04 0055) SpO2:  [93 %-98 %] 95 % (04/04 0600) Arterial Line BP: (116-206)/(51-90) 126/52 (04/04 0600) FiO2 (%):  [30 %] 30 % (04/04 0400) Weight:  [73.1 kg] 73.1 kg (04/04 0400) Last BM Date: 03/17/20  Intake/Output from previous day: 04/03 0701 - 04/04 0700 In: 3609.4 [I.V.:2512.3; IV Piggyback:1097.1] Out: 5705 [Urine:5450; Emesis/NG output:40; Drains:5; Stool:210] Intake/Output this shift: No intake/output data recorded.  Wound open clean ostomy viable stool in bag  Lab Results:  Recent Labs    03/15/20 1046  WBC 17.7*  HGB 9.7*  HCT 29.0*  PLT 316   BMET Recent Labs    03/16/20 0355 03/17/20 0226  NA 148* 150*  K 3.8 4.5  CL 116* 112*  CO2 24 27  GLUCOSE 279* 186*  BUN 68* 79*  CREATININE 1.08* 1.03*  CALCIUM 8.4* 9.5   PT/INR No results for input(s): LABPROT, INR in the last 72 hours. ABG Recent Labs    03/16/20 2245  PHART 7.398  HCO3 27.7    Studies/Results: DG Chest Port 1 View  Result Date: 03/16/2020 CLINICAL DATA:  Respiratory failure EXAM: PORTABLE CHEST 1 VIEW COMPARISON:  Portable exam 2253 hours compared to 03/15/2020 FINDINGS: Tip of endotracheal tube projects 3.0 cm above carina. Nasogastric tube extends into stomach. LEFT jugular central venous catheter with tip projecting over SVC. Chronic prominence of the RIGHT hilum unchanged. Normal heart size, mediastinal contours, and pulmonary vascularity. Emphysematous changes with persistent RIGHT upper lobe infiltrate. Remaining lungs clear. No pleural effusion or pneumothorax. IMPRESSION: Persistent prominence of RIGHT hilum as well as persistent RIGHT upper lobe infiltrate. Either radiographic follow-up until resolution or  CT chest recommended to exclude mass/adenopathy at the RIGHT hilum. Electronically Signed   By: Lavonia Dana M.D.   On: 03/16/2020 23:03    Anti-infectives: Anti-infectives (From admission, onward)   Start     Dose/Rate Route Frequency Ordered Stop   03/15/20 2200  meropenem (MERREM) 1 g in sodium chloride 0.9 % 100 mL IVPB     1 g 200 mL/hr over 30 Minutes Intravenous Every 12 hours 03/15/20 1021     03/14/20 1000  anidulafungin (ERAXIS) 100 mg in sodium chloride 0.9 % 100 mL IVPB     100 mg 78 mL/hr over 100 Minutes Intravenous Every 24 hours 03/13/20 0752     03/13/20 0900  anidulafungin (ERAXIS) 200 mg in sodium chloride 0.9 % 200 mL IVPB     200 mg 78 mL/hr over 200 Minutes Intravenous  Once 03/13/20 0752 03/13/20 1241   03/11/20 2000  meropenem (MERREM) 500 mg in sodium chloride 0.9 % 100 mL IVPB  Status:  Discontinued     500 mg 200 mL/hr over 30 Minutes Intravenous Every 12 hours 03/11/20 1658 03/15/20 1021   03/11/20 1200  piperacillin-tazobactam (ZOSYN) IVPB 2.25 g  Status:  Discontinued     2.25 g 100 mL/hr over 30 Minutes Intravenous Every 6 hours 03/11/20 1058 03/11/20 1641   03/06/20 1200  piperacillin-tazobactam (ZOSYN) IVPB 3.375 g  Status:  Discontinued     3.375 g 12.5 mL/hr over 240 Minutes Intravenous Every 8 hours 03/06/20 0739 03/11/20 1058   03/05/20 1400  piperacillin-tazobactam (ZOSYN) IVPB 2.25 g  Status:  Discontinued     2.25 g 100 mL/hr over 30 Minutes Intravenous Every 8 hours 03/05/20 0752 03/06/20 0739   03/04/20 2200  piperacillin-tazobactam (ZOSYN) IVPB 3.375 g  Status:  Discontinued     3.375 g 12.5 mL/hr over 240 Minutes Intravenous Every 8 hours 03/03/20 2032 03/05/20 0752   03/03/20 1545  cefoTEtan (CEFOTAN) 2 g in sodium chloride 0.9 % 100 mL IVPB     2 g 200 mL/hr over 30 Minutes Intravenous On call to O.R. 03/03/20 1532 03/04/20 0444   03/03/20 1345  piperacillin-tazobactam (ZOSYN) IVPB 3.375 g     3.375 g 12.5 mL/hr over 240 Minutes  Intravenous  Once 03/03/20 1330 03/03/20 1507      Assessment/Plan: s/p Procedure(s): EXPLORATORY LAPAROTOMY WITH BOWEL RESECTION AND COLOSTOMY (N/A) COPD VDRF -vent per CCM, appreciate assistance, VQ scan with low probability for PE, heparin gtt stopped AKI improving ABL anemia -s/p 2 units PRBC 3/30,  Septic shock - IV abx and antifungal hopefully wean to extubate soon     Perforated sigmoid colon Proximal rectal cancer with obstruction S/p emergent Low anterior resection/ colostomy by Dr. Marcello Moores 3/21 S/p exploratory laparotomy and colon resection with transverse colostomy 3/31 Dr. Hassell Done  - continue daily dressing changes - WOC following for ostomy teaching - stomarevised yesterday, somewhat dusky but viable -- TPN    FEN -NPO,clamping OGT, TPN VTE - SCDs,SQ heparin ID - Zosyn3/21>3/29; Merrem 3/29>>, Eraxis 3/31>> WBC 17,000  Foley -in place  LOS: 14 days    Joyice Faster Peyton Spengler 03/17/2020

## 2020-03-18 LAB — COMPREHENSIVE METABOLIC PANEL
ALT: 21 U/L (ref 0–44)
AST: 24 U/L (ref 15–41)
Albumin: 3.3 g/dL — ABNORMAL LOW (ref 3.5–5.0)
Alkaline Phosphatase: 82 U/L (ref 38–126)
Anion gap: 8 (ref 5–15)
BUN: 93 mg/dL — ABNORMAL HIGH (ref 8–23)
CO2: 32 mmol/L (ref 22–32)
Calcium: 9.3 mg/dL (ref 8.9–10.3)
Chloride: 110 mmol/L (ref 98–111)
Creatinine, Ser: 0.89 mg/dL (ref 0.44–1.00)
GFR calc Af Amer: >60 mL/min (ref >60)
GFR calc non Af Amer: >60 mL/min (ref >60)
Glucose, Bld: 168 mg/dL — ABNORMAL HIGH (ref 70–99)
Potassium: 4.7 mmol/L (ref 3.5–5.1)
Sodium: 150 mmol/L — ABNORMAL HIGH (ref 135–145)
Total Bilirubin: 0.6 mg/dL (ref 0.3–1.2)
Total Protein: 5.7 g/dL — ABNORMAL LOW (ref 6.5–8.1)

## 2020-03-18 LAB — DIFFERENTIAL
Abs Immature Granulocytes: 0.41 10*3/uL — ABNORMAL HIGH (ref 0.00–0.07)
Basophils Absolute: 0.1 10*3/uL (ref 0.0–0.1)
Basophils Relative: 0 %
Eosinophils Absolute: 0 10*3/uL (ref 0.0–0.5)
Eosinophils Relative: 0 %
Immature Granulocytes: 2 %
Lymphocytes Relative: 2 %
Lymphs Abs: 0.5 10*3/uL — ABNORMAL LOW (ref 0.7–4.0)
Monocytes Absolute: 1.1 10*3/uL — ABNORMAL HIGH (ref 0.1–1.0)
Monocytes Relative: 5 %
Neutro Abs: 19.6 10*3/uL — ABNORMAL HIGH (ref 1.7–7.7)
Neutrophils Relative %: 91 %

## 2020-03-18 LAB — GLUCOSE, CAPILLARY
Glucose-Capillary: 156 mg/dL — ABNORMAL HIGH (ref 70–99)
Glucose-Capillary: 138 mg/dL — ABNORMAL HIGH (ref 70–99)
Glucose-Capillary: 152 mg/dL — ABNORMAL HIGH (ref 70–99)
Glucose-Capillary: 196 mg/dL — ABNORMAL HIGH (ref 70–99)
Glucose-Capillary: 202 mg/dL — ABNORMAL HIGH (ref 70–99)

## 2020-03-18 LAB — CBC
HCT: 29.6 % — ABNORMAL LOW (ref 36.0–46.0)
Hemoglobin: 9.3 g/dL — ABNORMAL LOW (ref 12.0–15.0)
MCH: 30.4 pg (ref 26.0–34.0)
MCHC: 31.4 g/dL (ref 30.0–36.0)
MCV: 96.7 fL (ref 80.0–100.0)
Platelets: 481 10*3/uL — ABNORMAL HIGH (ref 150–400)
RBC: 3.06 MIL/uL — ABNORMAL LOW (ref 3.87–5.11)
RDW: 16.1 % — ABNORMAL HIGH (ref 11.5–15.5)
WBC: 21.6 10*3/uL — ABNORMAL HIGH (ref 4.0–10.5)
nRBC: 0 % (ref 0.0–0.2)

## 2020-03-18 LAB — PREALBUMIN: Prealbumin: 13.8 mg/dL — ABNORMAL LOW (ref 18–38)

## 2020-03-18 LAB — TRIGLYCERIDES: Triglycerides: 148 mg/dL (ref <150)

## 2020-03-18 LAB — MAGNESIUM: Magnesium: 2 mg/dL (ref 1.7–2.4)

## 2020-03-18 LAB — PHOSPHORUS: Phosphorus: 3.2 mg/dL (ref 2.5–4.6)

## 2020-03-18 MED ORDER — TRAVASOL 10 % IV SOLN
INTRAVENOUS | Status: DC
  Filled 2020-03-18: qty 1344

## 2020-03-18 MED ORDER — SODIUM CHLORIDE 0.9 % IV SOLN
1.0000 g | Freq: Three times a day (TID) | INTRAVENOUS | Status: DC
  Administered 2020-03-18 – 2020-03-26 (×25): 1 g via INTRAVENOUS
  Filled 2020-03-18 (×25): qty 1

## 2020-03-18 MED ORDER — DEXTROSE 5 % IV SOLN: INTRAVENOUS | Status: AC

## 2020-03-18 MED ORDER — METHYLPREDNISOLONE SODIUM SUCC 40 MG IJ SOLR
20.0000 mg | Freq: Every day | INTRAMUSCULAR | Status: DC
  Administered 2020-03-18 – 2020-03-19 (×2): 20 mg via INTRAVENOUS
  Filled 2020-03-18: qty 1

## 2020-03-18 MED ORDER — DEXMEDETOMIDINE HCL IN NACL 200 MCG/50ML IV SOLN
0.4000 ug/kg/h | INTRAVENOUS | Status: DC
  Administered 2020-03-18: 0.4 ug/kg/h via INTRAVENOUS
  Administered 2020-03-18 – 2020-03-19 (×4): 0.6 ug/kg/h via INTRAVENOUS
  Administered 2020-03-19: 0.5 ug/kg/h via INTRAVENOUS
  Administered 2020-03-19: 19:00:00 0.6 ug/kg/h via INTRAVENOUS
  Administered 2020-03-19 – 2020-03-20 (×3): 0.7 ug/kg/h via INTRAVENOUS
  Administered 2020-03-20: 0.6 ug/kg/h via INTRAVENOUS
  Administered 2020-03-20 (×4): 0.7 ug/kg/h via INTRAVENOUS
  Administered 2020-03-21: 08:00:00 0.3 ug/kg/h via INTRAVENOUS
  Administered 2020-03-21: 0.4 ug/kg/h via INTRAVENOUS
  Administered 2020-03-21: 02:00:00 0.7 ug/kg/h via INTRAVENOUS
  Administered 2020-03-22 (×2): 0.4 ug/kg/h via INTRAVENOUS
  Filled 2020-03-18 (×20): qty 50

## 2020-03-18 NOTE — Progress Notes (Addendum)
NAME:  Tracy [name], MRN:  [id number], DOB:  [birth year], LOS: 13 ADMISSION DATE:  03/03/2020, CONSULTATION DATE:  3/27  REFERRING MD:  Ninfa Linden, CHIEF COMPLAINT:  Abdominal pain   Brief History   69 y/o female admitted 3/21 with bowel perforation requiring colostomy and lower anterior resection in setting of newly diagnosed proximal rectal cancer with sigmoid perforation.  Post op developed septic shock, HCAP AKI ileus and acute hypoxemic respiratory failure requiring intubation.   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 Valeta Harms. Not on O2.  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, metastatic carcinoma in 3/15 lymph nodes  LE [name] 3/27 >> negative for DVT  ECHO 3/27 >> poor windows, normal LV function, severely dilated RV with moderately reduced function, RV volume and pressure overload, mildly dilated RA  V/Q Scan 3/30 >> low probability for PE  Micro Data:  COVID 3/21 >> Negative MRSA PCR 3/21 >> Negative Blood cultures 3/27 >> negative  Urine culture 3/26 >> insignificant growth  Tracheal Aspirate 3/29 >> Saccharomyces cerevisiae Trach aspirate 4/2>>  Antimicrobials:  Zosyn 3/21  >> 3/29 Anidulafungin 3/31 >> 4/5 Meropenem 3/29 >>   Interim history/subjective:   Failed SBT off propofol, did OK on propofol  Objective   Blood pressure (!) 175/73, pulse 80, temperature 97.7 F (36.5 C), temperature source Axillary, resp. rate 15, height 5\' 2"  (1.575 m), weight 69.3 kg, last menstrual period 12/14/2000, SpO2 95 %.    Vent Mode: PRVC FiO2 (%):  [30 %] 30 % Set Rate:  [10 bmp] 10 bmp Vt Set:  [400 mL] 400 mL PEEP:  [5 cmH20] 5 cmH20 Pressure Support:  [8 cmH20] 8 cmH20 Plateau Pressure:  [11 cmH20-23 cmH20] 14 cmH20   Intake/Output Summary (Last 24 hours) at 03/18/2020 0752 Last data filed at 03/18/2020 [id number] Gross per 24 hour  Intake 2881.33 ml  Output 1410 ml  Net 1471.33 ml   Filed Weights   03/16/20 0500 03/17/20 0400 03/18/20 0353  Weight: 75.7 kg 73.1 kg 69.3 kg    Examination:  General:  In bed on vent HENT: NCAT ETT in place PULM: Poor air movement B, vent supported breathing CV: RRR, no mgr GI: abdominal drain, dressing in place, ostomy stoma noted, red MSK: normal bulk and tone Neuro: sedated on vent  4/3 CXR images personally reviewed> emphysema bilaterally, ETT in place  Resolved Hospital Problem list     Assessment & Plan:  Acute hypoxemic respiratory failure> pressure support trial limited by anxiety/pain Baseline GOLD III/IV COPD Pressure support trial on precedex 4/5, likely attempt extubation given prolonged pressure support trials over weekend VAP prevention measures Continue budesonide/brovana/duoneb scheduled Wean systemic steroids  Bowel perforation/partial colectomy Per general surgery  Stop anidulofungin  Colon cancer Per general surgery  Anasarca, neg negative on 4/4, exam likely improved Hypernatremia  Continue to hold lasix Start D5 Monitor BMET and UOP Replace electrolytes as needed  AKI Monitor BMET and UOP Replace electrolytes as needed  Hypertension Monitor  Moderate Malnutrition TPN Enteric  feeding per general surgery  Acute anemia, stable 4/4 without bleeding Monitor for bleeding Transfuse PRBC for Hgb < 7 gm/dL   Hyperglycemia SSI+ glargine  Need for sedation for mechanical ventilation RASS 0 to -1 PAD protocol: change to fentanyl/precedex  Prognosis guarded  Best practice:  Diet: TPN Pain/Anxiety/Delirium protocol (if indicated): yes, as above VAP protocol (if indicated): yes DVT prophylaxis: sub q heparin GI prophylaxis: PPI Glucose control: as above Mobility: bed rest Code Status: full Family Communication: I updated her husband Danny bedside today Disposition: remain in ICU  Labs   CBC: Recent Labs  Lab 03/13/20 0516 03/13/20 1141 03/14/20 0544 03/15/20 1046 03/18/20 0441  WBC 32.7* 26.7* 24.6* 17.7* 21.6*  NEUTROABS  --   --  23.4* 16.3* 19.6*  HGB 13.2 10.1* 9.3* 9.7* 9.3*  HCT 40.5 30.8* 28.1* 29.0* 29.6*  MCV 94.4 93.6 94.6 93.2 96.7  PLT 325 240 233 316 481*    Basic Metabolic Panel: Recent Labs  Lab 03/14/20 0544 03/15/20 0348 03/16/20 0355 03/17/20 0226 03/18/20 0441  NA 142 147* 148* 150* 150*  K 3.7 3.3* 3.8 4.5 4.7  CL 110 116* 116* 112* 110  CO2 19* 21* 24 27 32  GLUCOSE 101* 180* 279* 186* 168*  BUN 84* 70* 68* 79* 93*  CREATININE 2.27* 1.62* 1.08* 1.03* 0.89  CALCIUM 8.0* 8.0* 8.4* 9.5 9.3  MG 2.4 2.3 2.1 2.0 2.0  PHOS 4.6 4.0 2.6 2.4* 3.2   GFR: Estimated Creatinine Clearance: 56.7 mL/min (by C-G formula based on SCr of 0.89 mg/dL). Recent Labs  Lab 03/11/20 1057 03/11/20 2231 03/13/20 1141 03/14/20 0544 03/15/20 1046 03/18/20 0441  WBC  --    < > 26.7* 24.6* 17.7* 21.6*  LATICACIDVEN 1.3  --   --   --   --   --    < > = values in this interval not displayed.    Liver Function Tests: Recent Labs  Lab 03/14/20 0544 03/16/20 0355 03/18/20 0441  AST 27 20 24   ALT 27 22 21   ALKPHOS 54 60 82  BILITOT 1.1 0.8 0.6  PROT 5.3* 5.0* 5.7*  ALBUMIN 3.5 2.8* 3.3*   No results for input(s): LIPASE, AMYLASE  in the last 168 hours. No results for input(s): AMMONIA in the last 168 hours.  ABG    Component Value Date/Time   PHART 7.398 03/16/2020 2245   PCO2ART 45.6 03/16/2020 2245   PO2ART 70.3 (L) 03/16/2020 2245   HCO3 27.7 03/16/2020 2245   TCO2 24 03/12/2020 1513   ACIDBASEDEF 4.8 (H) 03/12/2020 1738   O2SAT 93.5 03/16/2020 2245     Coagulation Profile: No results for input(s): INR, PROTIME in the last 168 hours.  Cardiac Enzymes: No results for input(s): CKTOTAL, CKMB, CKMBINDEX, TROPONINI in the last 168 hours.  HbA1C: Hgb A1c MFr Bld  Date/Time Value Ref Range Status  03/11/2020 05:00 AM 5.9 (H) 4.8 - 5.6 % Final    Comment:    (NOTE) Pre diabetes:          5.7%-6.4% Diabetes:              >6.4% Glycemic control for   <7.0% adults with diabetes  CBG: Recent Labs  Lab 03/17/20 1144 03/17/20 1535 03/17/20 1939 03/17/20 2315 03/18/20 0318  GLUCAP 150* 176* 176* 171* 156*     Critical care time: 31 minutes     Roselie Awkward, MD [place] [facility] Pager: [phone] Cell: [phone] If no response, call [phone]

## 2020-03-18 NOTE — Consult Note (Signed)
[place] Nurse ostomy follow up Stoma type/location: LLQ colostomy. Pouch in place was recently applied.  Husband at bedside. There is only blood tinged liquid in the pouch.  Spouse agrees to perform pouch change tomorrow. Today we perform emptying   Stomal assessment/size: 2" edematous, pink and moist Peristomal assessment: pouch intact Treatment options for stomal/peristomal skin: barrier ring and 2 piece system.  Output blood tinged liquid Ostomy pouching: 2pc. 2 3/4" pouch with barrier ring.  Education provided: Discussed twice weekly pouch changes and showering.  Spouse elaborates that they have a small grandchild that they spend a lot of time with and want to be active and available for that.  He is feeling confident about pouch care but eager for patient to learn as well.  Emotional support provided.  We are here for teaching and ostomy care as needed.  Spouse is appreciative and agrees to perform pouch change with me tomorrow.  [place] team will follow.  Domenic Moras MSN, RN, FNP-BC [facility] Wound, Ostomy, Continence Nurse Pager [phone]

## 2020-03-18 NOTE — Progress Notes (Signed)
6 Days Post-Op   Subjective/Chief Complaint: No significant changes, but weaning.     Objective: Vital signs in last 24 hours: Temp:  [97.5 F (36.4 C)-97.9 F (36.6 C)] 97.6 F (36.4 C) (04/05 0800) Pulse Rate:  [80-107] 91 (04/05 1000) Resp:  [13-29] 25 (04/05 1000) BP: (151-175)/(67-73) 175/73 (04/04 2329) SpO2:  [95 %-100 %] 95 % (04/05 1120) Arterial Line BP: (122-218)/(54-94) 163/77 (04/05 1000) FiO2 (%):  [30 %] 30 % (04/05 1120) Weight:  [69.3 kg] 69.3 kg (04/05 0353) Last BM Date: 03/17/20  Intake/Output from previous day: 04/04 0701 - 04/05 0700 In: 2881.3 [I.V.:2551.3; IV Piggyback:330] Out: [id number] [Urine:1350; Drains:60] Intake/Output this shift: Total I/O In: 745.9 [I.V.:646.6; IV Piggyback:99.3] Out: -   Sedated, intubated. Breathing comfortably RR&R Abd soft.   Wound open clean ostomy viable, small amount of clear liq in ostomy bag.  No gas in bag.    Lab Results:  Recent Labs    03/18/20 0441  WBC 21.6*  HGB 9.3*  HCT 29.6*  PLT 481*   BMET Recent Labs    03/17/20 0226 03/18/20 0441  NA 150* 150*  K 4.5 4.7  CL 112* 110  CO2 27 32  GLUCOSE 186* 168*  BUN 79* 93*  CREATININE 1.03* 0.89  CALCIUM 9.5 9.3   PT/INR No results for input(s): LABPROT, INR in the last 72 hours. ABG Recent Labs    03/16/20 2245  PHART 7.398  HCO3 27.7    Studies/Results: DG Chest Port 1 View  Result Date: 03/16/2020 CLINICAL DATA:  Respiratory failure EXAM: PORTABLE CHEST 1 VIEW COMPARISON:  Portable exam 2253 hours compared to 03/15/2020 FINDINGS: Tip of endotracheal tube projects 3.0 cm above carina. Nasogastric tube extends into stomach. LEFT jugular central venous catheter with tip projecting over SVC. Chronic prominence of the RIGHT hilum unchanged. Normal heart size, mediastinal contours, and pulmonary vascularity. Emphysematous changes with persistent RIGHT upper lobe infiltrate. Remaining lungs clear. No pleural effusion or pneumothorax. IMPRESSION:  Persistent prominence of RIGHT hilum as well as persistent RIGHT upper lobe infiltrate. Either radiographic follow-up until resolution or CT chest recommended to exclude mass/adenopathy at the RIGHT hilum. Electronically Signed   By: Lavonia Dana M.D.   On: 03/16/2020 23:03    Anti-infectives: Anti-infectives (From admission, onward)   Start     Dose/Rate Route Frequency Ordered Stop   03/18/20 0900  meropenem (MERREM) 1 g in sodium chloride 0.9 % 100 mL IVPB     1 g 200 mL/hr over 30 Minutes Intravenous Every 8 hours 03/18/20 0838     03/15/20 2200  meropenem (MERREM) 1 g in sodium chloride 0.9 % 100 mL IVPB  Status:  Discontinued     1 g 200 mL/hr over 30 Minutes Intravenous Every 12 hours 03/15/20 1021 03/18/20 0838   03/14/20 1000  anidulafungin (ERAXIS) 100 mg in sodium chloride 0.9 % 100 mL IVPB  Status:  Discontinued     100 mg 78 mL/hr over 100 Minutes Intravenous Every 24 hours 03/13/20 0752 03/18/20 0849   03/13/20 0900  anidulafungin (ERAXIS) 200 mg in sodium chloride 0.9 % 200 mL IVPB     200 mg 78 mL/hr over 200 Minutes Intravenous  Once 03/13/20 0752 03/13/20 1241   03/11/20 2000  meropenem (MERREM) 500 mg in sodium chloride 0.9 % 100 mL IVPB  Status:  Discontinued     500 mg 200 mL/hr over 30 Minutes Intravenous Every 12 hours 03/11/20 1658 03/15/20 1021   03/11/20 1200  piperacillin-tazobactam (  ZOSYN) IVPB 2.25 g  Status:  Discontinued     2.25 g 100 mL/hr over 30 Minutes Intravenous Every 6 hours 03/11/20 1058 03/11/20 1641   03/06/20 1200  piperacillin-tazobactam (ZOSYN) IVPB 3.375 g  Status:  Discontinued     3.375 g 12.5 mL/hr over 240 Minutes Intravenous Every 8 hours 03/06/20 0739 03/11/20 1058   03/05/20 1400  piperacillin-tazobactam (ZOSYN) IVPB 2.25 g  Status:  Discontinued     2.25 g 100 mL/hr over 30 Minutes Intravenous Every 8 hours 03/05/20 0752 03/06/20 0739   03/04/20 2200  piperacillin-tazobactam (ZOSYN) IVPB 3.375 g  Status:  Discontinued     3.375  g 12.5 mL/hr over 240 Minutes Intravenous Every 8 hours 03/03/20 2032 03/05/20 0752   03/03/20 1545  cefoTEtan (CEFOTAN) 2 g in sodium chloride 0.9 % 100 mL IVPB     2 g 200 mL/hr over 30 Minutes Intravenous On call to O.R. 03/03/20 1532 03/04/20 0444   03/03/20 1345  piperacillin-tazobactam (ZOSYN) IVPB 3.375 g     3.375 g 12.5 mL/hr over 240 Minutes Intravenous  Once 03/03/20 1330 03/03/20 1507      Assessment/Plan: s/p Procedure(s): EXPLORATORY LAPAROTOMY WITH BOWEL RESECTION AND COLOSTOMY (N/A) COPD VDRF -vent per CCM, appreciate assistance, VQ scan with low probability for PE, heparin gtt stopped AKI improving ABL anemia -s/p 2 units PRBC 3/30,  Septic shock - IV abx and antifungal hopefully wean to extubate soon     Perforated sigmoid colon Proximal rectal cancer with obstruction S/p emergent Low anterior resection/ colostomy by Dr. Marcello Moores 3/21 S/p exploratory laparotomy and colon resection with transverse colostomy 3/31 Dr. Hassell Done  - continue daily dressing changes - WOC following for ostomy teaching   -- TPN    FEN -NPO, TPN.  If extubates, can do clears today. VTE - SCDs,SQ heparin ID - Zosyn3/21>3/29; Merrem 3/29>>, Eraxis 3/31>> WBC 17,000  Foley -in place   LOS: 15 days    Stark Klein 03/18/2020

## 2020-03-18 NOTE — Progress Notes (Signed)
Attempted to wean patient on PS of 8/+5; PS increased to obtain Vt >250 mL. Patient had increased WOB and accessory muscle use; RR 35. Patient returned to [facility] and attending MD made aware.

## 2020-03-18 NOTE — Progress Notes (Signed)
Pharmacy Antibiotic Note  Tracy [name] is a 69 y.o. female with newly dx rectal cancer and  perforated sigmoid colon (s/p anterior resection/colostomy on 3/21 and colon resection with transverse colostomy on 3/31) currently on meropenem and Eraxis for intra-abdominal infection.  Today, 03/18/2020: - day #14 total of abx; day #8 meropenem and day #6 Eraxis - afeb, wbc up 21.6 (on steroid) - scr down 0.89 (crcl~57) - pt remains intubated  Plan: - adjust meropenem to 1gm IV q8h - Eraxis 100 mg IV q24h ________________________________  Height: 5\' 2"  (157.5 cm) Weight: 69.3 kg (152 lb 12.5 oz) IBW/kg (Calculated) : 50.1  Temp (24hrs), Avg:97.7 F (36.5 C), Min:97.5 F (36.4 C), Max:97.9 F (36.6 C)  Recent Labs  Lab 03/11/20 1057 03/11/20 2231 03/13/20 0516 03/13/20 0516 03/13/20 1141 03/14/20 0544 03/15/20 0348 03/15/20 1046 03/16/20 0355 03/17/20 0226 03/18/20 0441  WBC  --    < > 32.7*  --  26.7* 24.6*  --  17.7*  --   --  21.6*  CREATININE  --    < > 2.53*   < >  --  2.27* 1.62*  --  1.08* 1.03* 0.89  LATICACIDVEN 1.3  --   --   --   --   --   --   --   --   --   --    < > = values in this interval not displayed.    Estimated Creatinine Clearance: 56.7 mL/min (by C-G formula based on SCr of 0.89 mg/dL).    Allergies  Allergen Reactions  . Codeine Swelling    Antimicrobials this admission:  3/21 Cefotetan x 1 3/22 Zosyn >> 3/29 3/29 Meropenem >>  3/31 Eraxis >>   Dose adjustments this admission:  3/23 ZEI > 2.25 q8 3/24 2.25 > ZEI 4/2 changed meropenem from 500mg  q12 to 1g q12 due to improved SCr 4/5: changed meropenem to 1gm q8h  Microbiology results:  3/21 Influenza: neg; Covid: neg 3/21 MRSA PCR neg 3/26 UCx: < 10k colonies 3/27 BC: neg FINAL 3/29 TA: few yeast, few saccharomyces cerevisiae FINAL 4/2 TA: few yeast  Thank you for allowing pharmacy to be a part of this patient's care.  Lynelle Doctor 03/18/2020 8:28 AM

## 2020-03-18 NOTE — Progress Notes (Signed)
PHARMACY - TOTAL PARENTERAL NUTRITION CONSULT NOTE   Indication: Prolonged ileus  Patient Measurements: Height: 5\' 2"  (157.5 cm) Weight: 69.3 kg (152 lb 12.5 oz) IBW/kg (Calculated) : 50.1   Body mass index is 27.94 kg/m.   Assessment:  56 yoF admitted on 3/21 bowel perforation s/p surgery on 3/21 and 3/31.  She remains intubated, on vasopressors, on antibiotic and antifungal coverage.  Tube feeds were given 3/27-3/29, then held.  OGT is still to suction, has bilious drainage.   Pharmacy is consulted to dose TPN.    Glucose / Insulin: A1c 5.9 - mSSI q4h + lantus 8 units daily (used 25 units from SSI in the past 24 hrs) - on solumedrol 40 mg daily--> reduced to 20 mg daily on 4/5 - CBGs (goal <150): 138-176  Electrolytes: Na elevated but stable at 150 (Na removed from TPN), K trending up 4.7, phos 3.2, mag 2, CorrCa wnl - Cl 110, CO2 32 Renal: SCr down to 0.89, BUN 93 rising LFTs / TGs: LFTs WNL / TG  Up 148 Prealbumin / albumin: 13.8 / 3.3 Intake / Output; MIVF: +367 ml Medications:  MVI, PPI, Propofol started early 4/4 --> off at 0800 on 4/5 GI Imaging: Surgeries / Procedures:  3/21 emergent Low anterior resection/ colostomy  3/31 exploratory laparotomy and colon resection with transverse colostomy 4/3: trial of clamping OGT today per CCS note  Central access: CVC triple lumen (placed 3/27) TPN start date: 4/1  Nutritional Goals (per RD recommendation on 4/1): Kcal:  1564, Protein:  127-140, Fluid:  >/= 1.5 L/day Goal TPN rate is 80 mL/hr (see below for new totals while pt on propofol) - While lipids are held for the first 7 days, goal will be to meet 100% of the patient's protein needs and approximately 80% of their caloric needs. - Glucose infusion rate will be at 2.1 mg/kg/min with new  (Maximum 5 mg/kg/min)  Current Nutrition:  NPO  Plan:  - Continue TPN at goal 80 ml/hr at 1800 - Hold lipid emulsion for first 7 days for critically ill patients per ASPEN guidelines  (Start date 4/8). With propofol off, will change protein back to 134 gm /day and dextrose to 15% - Electrolytes in TPN: 0 Na, decrease to 50 mEq/L of K, 7.45mEq/L of Ca, 60mEq/L of Mg, continue 15 mmol/L of Phos. Cl:Ac ratio - max Ac - Add standard MVI to TPN on MWF only  - Add standard trace elements to TPN daily - Continue SSI q4h moderate scale and lantus 8 units daily - Monitor TPN labs on Mon/Thurs and as needed - With continued rise in Na, would defer to Md for management outside of TPN since no Na currently in TPN. D5 at 50 ml/hr started on 4/5 per MD   Dia Sitter, PharmD, BCPS 03/18/2020 8:22 AM

## 2020-03-19 ENCOUNTER — Inpatient Hospital Stay: Payer: Self-pay

## 2020-03-19 ENCOUNTER — Inpatient Hospital Stay (HOSPITAL_COMMUNITY): Payer: Medicare HMO

## 2020-03-19 LAB — CBC
HCT: 28.9 % — ABNORMAL LOW (ref 36.0–46.0)
Hemoglobin: 8.8 g/dL — ABNORMAL LOW (ref 12.0–15.0)
MCH: 30.1 pg (ref 26.0–34.0)
MCHC: 30.4 g/dL (ref 30.0–36.0)
MCV: 99 fL (ref 80.0–100.0)
Platelets: 450 10*3/uL — ABNORMAL HIGH (ref 150–400)
RBC: 2.92 MIL/uL — ABNORMAL LOW (ref 3.87–5.11)
RDW: 16 % — ABNORMAL HIGH (ref 11.5–15.5)
WBC: 18.4 10*3/uL — ABNORMAL HIGH (ref 4.0–10.5)
nRBC: 0 % (ref 0.0–0.2)

## 2020-03-19 LAB — GLUCOSE, CAPILLARY
Glucose-Capillary: 171 mg/dL — ABNORMAL HIGH (ref 70–99)
Glucose-Capillary: 165 mg/dL — ABNORMAL HIGH (ref 70–99)
Glucose-Capillary: 121 mg/dL — ABNORMAL HIGH (ref 70–99)
Glucose-Capillary: 131 mg/dL — ABNORMAL HIGH (ref 70–99)
Glucose-Capillary: 137 mg/dL — ABNORMAL HIGH (ref 70–99)
Glucose-Capillary: 121 mg/dL — ABNORMAL HIGH (ref 70–99)
Glucose-Capillary: 120 mg/dL — ABNORMAL HIGH (ref 70–99)

## 2020-03-19 LAB — BASIC METABOLIC PANEL
Anion gap: 7 (ref 5–15)
BUN: 83 mg/dL — ABNORMAL HIGH (ref 8–23)
CO2: 30 mmol/L (ref 22–32)
Calcium: 9.4 mg/dL (ref 8.9–10.3)
Chloride: 113 mmol/L — ABNORMAL HIGH (ref 98–111)
Creatinine, Ser: 0.71 mg/dL (ref 0.44–1.00)
GFR calc Af Amer: >60 mL/min (ref >60)
GFR calc non Af Amer: >60 mL/min (ref >60)
Glucose, Bld: 129 mg/dL — ABNORMAL HIGH (ref 70–99)
Potassium: 4.7 mmol/L (ref 3.5–5.1)
Sodium: 150 mmol/L — ABNORMAL HIGH (ref 135–145)

## 2020-03-19 LAB — MAGNESIUM: Magnesium: 2 mg/dL (ref 1.7–2.4)

## 2020-03-19 LAB — COMPREHENSIVE METABOLIC PANEL
ALT: 22 U/L (ref 0–44)
AST: 23 U/L (ref 15–41)
Albumin: 2.9 g/dL — ABNORMAL LOW (ref 3.5–5.0)
Alkaline Phosphatase: 78 U/L (ref 38–126)
Anion gap: 8 (ref 5–15)
BUN: 86 mg/dL — ABNORMAL HIGH (ref 8–23)
CO2: 31 mmol/L (ref 22–32)
Calcium: 9.3 mg/dL (ref 8.9–10.3)
Chloride: 111 mmol/L (ref 98–111)
Creatinine, Ser: 0.75 mg/dL (ref 0.44–1.00)
GFR calc Af Amer: >60 mL/min (ref >60)
GFR calc non Af Amer: >60 mL/min (ref >60)
Glucose, Bld: 173 mg/dL — ABNORMAL HIGH (ref 70–99)
Potassium: 5.2 mmol/L — ABNORMAL HIGH (ref 3.5–5.1)
Sodium: 150 mmol/L — ABNORMAL HIGH (ref 135–145)
Total Bilirubin: 0.8 mg/dL (ref 0.3–1.2)
Total Protein: 5.3 g/dL — ABNORMAL LOW (ref 6.5–8.1)

## 2020-03-19 LAB — PHOSPHORUS: Phosphorus: 3 mg/dL (ref 2.5–4.6)

## 2020-03-19 MED ORDER — SODIUM POLYSTYRENE SULFONATE 15 GM/60ML PO SUSP
30.0000 g | Freq: Once | ORAL | Status: AC
  Administered 2020-03-19: 30 g
  Filled 2020-03-19: qty 120

## 2020-03-19 MED ORDER — DEXTROSE 10 % IV SOLN: INTRAVENOUS | Status: DC

## 2020-03-19 MED ORDER — PROPOFOL 10 MG/ML IV BOLUS: 500.0000 mg | Freq: Once | INTRAVENOUS | Status: DC

## 2020-03-19 MED ORDER — SODIUM CHLORIDE (PF) 0.9 % IJ SOLN
INTRAMUSCULAR | Status: AC
  Administered 2020-03-19: 17:00:00 10 mL
  Filled 2020-03-19: qty 50

## 2020-03-19 MED ORDER — FENTANYL CITRATE (PF) 100 MCG/2ML IJ SOLN
200.0000 ug | Freq: Once | INTRAMUSCULAR | Status: DC
  Filled 2020-03-19: qty 4

## 2020-03-19 MED ORDER — MIDAZOLAM HCL 2 MG/2ML IJ SOLN
5.0000 mg | Freq: Once | INTRAMUSCULAR | Status: AC
  Administered 2020-03-20: 14:00:00 4 mg via INTRAVENOUS
  Filled 2020-03-19: qty 6

## 2020-03-19 MED ORDER — SODIUM CHLORIDE 0.9% FLUSH: 10.0000 mL | INTRAVENOUS | Status: DC | PRN

## 2020-03-19 MED ORDER — ETOMIDATE 2 MG/ML IV SOLN
40.0000 mg | Freq: Once | INTRAVENOUS | Status: AC
  Administered 2020-03-20: 20 mg via INTRAVENOUS

## 2020-03-19 MED ORDER — VECURONIUM BROMIDE 10 MG IV SOLR: 10.0000 mg | Freq: Once | INTRAVENOUS | Status: DC

## 2020-03-19 MED ORDER — SODIUM CHLORIDE 0.9% FLUSH
10.0000 mL | Freq: Two times a day (BID) | INTRAVENOUS | Status: DC
  Administered 2020-03-19 – 2020-03-30 (×15): 10 mL
  Administered 2020-03-30: 20 mL
  Administered 2020-03-31: 10 mL
  Administered 2020-03-31: 40 mL
  Administered 2020-04-01 – 2020-04-02 (×4): 10 mL
  Administered 2020-04-03: 20 mL
  Administered 2020-04-03 – 2020-04-04 (×2): 10 mL
  Administered 2020-04-04: 20 mL
  Administered 2020-04-05: 10 mL

## 2020-03-19 MED ORDER — IOHEXOL 300 MG/ML  SOLN
100.0000 mL | Freq: Once | INTRAMUSCULAR | Status: AC | PRN
  Administered 2020-03-19: 100 mL via INTRAVENOUS

## 2020-03-19 NOTE — Consult Note (Signed)
[place] Nurse ostomy follow up Stoma type/location: LLQ colostomy  Now producing stool Stomal assessment/size: 1 1/2" pink and moist Peristomal assessment: intact  Will add barrier ring  Midline incision present Treatment options for stomal/peristomal skin: barrier ring and 2 3/4" pouch   Output soft brown stool Ostomy pouching: 2pc. 2 3/4" pouch with barrier ring  Education provided: Spouse performs pouch change today. He removed old pouch (after emptying)  Cleansed skin with soap and water.  Applied barrier ring.  Cut barrier to fit.  Applied 2 piece pouch and rolled closed.  Reinforced to empty when 1/3 full.  Enrolled patient in [place] Discharge program: Yes Kit has arrived.  [facility] team will follow.  Domenic Moras MSN, RN, FNP-BC CWON Wound, Ostomy, Continence Nurse Pager [phone]

## 2020-03-19 NOTE — Progress Notes (Signed)
PHARMACY - TOTAL PARENTERAL NUTRITION CONSULT NOTE   Indication: Prolonged ileus  Patient Measurements: Height: 5\' 2"  (157.5 cm) Weight: 70.4 kg (155 lb 3.3 oz) IBW/kg (Calculated) : 50.1   Body mass index is 28.39 kg/m.   Assessment:  23 yoF admitted on 3/21 bowel perforation s/p surgery on 3/21 and 3/31.  She remains intubated, on vasopressors, on antibiotic and antifungal coverage.  Tube feeds were given 3/27-3/29, then held.  OGT is still to suction, has bilious drainage.   Pharmacy is consulted to dose TPN.    Glucose / Insulin: A1c 5.9 - mSSI q4h + lantus 8 units daily (used 27 units from SSI in the past 24 hrs) - on solumedrol 40 mg daily--> reduced to 20 mg daily on 4/5 - CBGs (goal <150): 128-202 (only one reading >200)  Electrolytes: Na elevated but stable at 150 (Na removed from TPN), K trending up 5.2 (with K reduced in TPN yesterday), phos 3.3, mag 2, CorrCa wnl - Cl 111, CO2 31 Renal: SCr wnl, BUN 86 LFTs / TGs: LFTs WNL / TG  Up 148 Prealbumin / albumin: 13.8 / 3.3 Intake / Output; MIVF: +585 ml Medications:  MVI, PPI -  Propofol started early 4/4 --> off at 0800 on 4/5 GI Imaging: Surgeries / Procedures:  3/21 emergent Low anterior resection/ colostomy  3/31 exploratory laparotomy and colon resection with transverse colostomy 4/3: trial of clamping OGT today per CCS note  Central access: CVC triple lumen (placed 3/27) TPN start date: 4/1  Nutritional Goals (per RD recommendation on 4/1): Kcal:  1564, Protein:  127-140, Fluid:  >/= 1.5 L/day Goal TPN rate is 80 mL/hr (see below for new totals while pt on propofol) - While lipids are held for the first 7 days, goal will be to meet 100% of the patient's protein needs and approximately 80% of their caloric needs. - Glucose infusion rate will be at 2.1 mg/kg/min with new  (Maximum 5 mg/kg/min)  Current Nutrition:  NPO  Plan:  - K 5.2 this morning --> CCM d/ced TPN at ~6a and started D10 at 40 ml/hr - Will  increase D10 to 80 ml/hr (TPN goal rate) while off TPN - Will d/c Lantus 8 units daily while patient is off TPN - f/u with labs on 4/7 and resume TPN if/when appropriate  - With elevated Na, would defer to Md for management outside of TPN since no Na currently in TPN. D5 at 50 ml/hr started on 4/5 per MD   Dia Sitter, PharmD, BCPS 03/19/2020 7:48 AM

## 2020-03-19 NOTE — Progress Notes (Signed)
[facility] Progress Note Patient Name: Tracy [name] DOB: [birth year] MRN: [id number]   Date of Service  03/19/2020  HPI/Events of Note  Patient has 2 orders for Dextrose: 1. D10W running at 80 mL/hour and D5W running at 100 mL/hour. She is currently only on D5W at 100 mL/hour. Blood glucose = 121.  eICU Interventions  Will D/C D10W running at 80 mL/hour.      Intervention Category Major Interventions: Other:  Lysle Dingwall 03/19/2020, 8:21 PM

## 2020-03-19 NOTE — Progress Notes (Signed)
[place] Progress Note Patient Name: Tracy [name] DOB: 1951-07-03 MRN: [id number]   Date of Service  03/19/2020  HPI/Events of Note  Hyperkalemia - K+ = 5.2. It appears that the TPN has a significant amount of K+ in it.   eICU Interventions  Will order: 1. D/C TPN. 2. D10W to run at 40 mL/hour.  3. Kayexalate 30 gm per tube now. 4. Repeat BMP at 12 noon.      Intervention Category Major Interventions: Electrolyte abnormality - evaluation and management  Tregan Read Eugene 03/19/2020, 5:48 AM

## 2020-03-19 NOTE — Progress Notes (Signed)
Spoke with Tracy [name] and she is aware to DC central line

## 2020-03-19 NOTE — Progress Notes (Signed)
Spoke with RN Wells Guiles concerning the take down of the TNA. At this time there was an order from the pharmacist to hold the patient's TNA due to the elevated K levels. TNA was stopped and RN to take down. No TNA will be hung tonight. TNA will resume when orders are given.

## 2020-03-19 NOTE — Progress Notes (Signed)
[facility] Progress Note  7 Days Post-Op  Subjective: CC-  On the vent, attempted to wean some yesterday.  No stool from colostomy. OG with low output. WBC slightly down 18.4, afebrile.  Objective: Vital signs in last 24 hours: Temp:  [98.2 F (36.8 C)-99.4 F (37.4 C)] 98.2 F (36.8 C) (04/06 0400) Pulse Rate:  [77-104] 86 (04/06 0753) Resp:  [22-38] 25 (04/06 0753) BP: (94-180)/(51-90) 180/90 (04/06 0753) SpO2:  [92 %-97 %] 97 % (04/06 0753) Arterial Line BP: (108-208)/(51-99) 143/70 (04/06 0751) FiO2 (%):  [30 %] 30 % (04/06 0753) Weight:  [70.4 kg] 70.4 kg (04/06 0423) Last BM Date: 03/17/20  Intake/Output from previous day: 04/05 0701 - 04/06 0700 In: 2340.9 [I.V.:2141.6; IV Piggyback:199.3] Out: 2026 [Urine:1880; Emesis/NG output:100; Drains:36; Stool:10] Intake/Output this shift: No intake/output data recorded.  PE: Gen:  NAD Cardio: 2+ DP pulses Pulm:  Mechanically ventilated Abd: soft, mild distension, open midline incision light pink with trace slough/ no significant drainage, ostomy viable with trace clear fluid and sweat in bag Ext:  Calves soft and nontender without edema Skin: warm and dry  Lab Results:  Recent Labs    03/18/20 0441 03/19/20 0415  WBC 21.6* 18.4*  HGB 9.3* 8.8*  HCT 29.6* 28.9*  PLT 481* 450*   BMET Recent Labs    03/18/20 0441 03/19/20 0415  NA 150* 150*  K 4.7 5.2*  CL 110 111  CO2 32 31  GLUCOSE 168* 173*  BUN 93* 86*  CREATININE 0.89 0.75  CALCIUM 9.3 9.3   PT/INR No results for input(s): LABPROT, INR in the last 72 hours. CMP     Component Value Date/Time   NA 150 (H) 03/19/2020 0415   K 5.2 (H) 03/19/2020 0415   CL 111 03/19/2020 0415   CO2 31 03/19/2020 0415   GLUCOSE 173 (H) 03/19/2020 0415   BUN 86 (H) 03/19/2020 0415   CREATININE 0.75 03/19/2020 0415   CALCIUM 9.3 03/19/2020 0415   PROT 5.3 (L) 03/19/2020 0415   ALBUMIN 2.9 (L) 03/19/2020 0415   AST 23 03/19/2020 0415   ALT 22  03/19/2020 0415   ALKPHOS 78 03/19/2020 0415   BILITOT 0.8 03/19/2020 0415   GFRNONAA >60 03/19/2020 0415   GFRAA >60 03/19/2020 0415   Lipase     Component Value Date/Time   LIPASE 18 03/03/2020 1214       Studies/Results: DG CHEST PORT 1 VIEW  Result Date: 03/19/2020 CLINICAL DATA:  Respiratory failure EXAM: PORTABLE CHEST 1 VIEW COMPARISON:  Three days ago FINDINGS: Endotracheal tube tip is halfway between the clavicular heads and carina. Left IJ line with tip at the SVC. The enteric tube at least reaches the stomach. Large lung volumes with lucency at the bases. No visible effusion or pneumothorax. No airspace disease. IMPRESSION: Stable hardware positioning and hyperinflated/lucent lung bases. Electronically Signed   By: Monte Fantasia M.D.   On: 03/19/2020 07:26    Anti-infectives: Anti-infectives (From admission, onward)   Start     Dose/Rate Route Frequency Ordered Stop   03/18/20 0900  meropenem (MERREM) 1 g in sodium chloride 0.9 % 100 mL IVPB     1 g 200 mL/hr over 30 Minutes Intravenous Every 8 hours 03/18/20 0838     03/15/20 2200  meropenem (MERREM) 1 g in sodium chloride 0.9 % 100 mL IVPB  Status:  Discontinued     1 g 200 mL/hr over 30 Minutes Intravenous Every 12 hours 03/15/20 1021 03/18/20 [id number]  03/14/20 1000  anidulafungin (ERAXIS) 100 mg in sodium chloride 0.9 % 100 mL IVPB  Status:  Discontinued     100 mg 78 mL/hr over 100 Minutes Intravenous Every 24 hours 03/13/20 0752 03/18/20 0849   03/13/20 0900  anidulafungin (ERAXIS) 200 mg in sodium chloride 0.9 % 200 mL IVPB     200 mg 78 mL/hr over 200 Minutes Intravenous  Once 03/13/20 0752 03/13/20 1241   03/11/20 2000  meropenem (MERREM) 500 mg in sodium chloride 0.9 % 100 mL IVPB  Status:  Discontinued     500 mg 200 mL/hr over 30 Minutes Intravenous Every 12 hours 03/11/20 1658 03/15/20 1021   03/11/20 1200  piperacillin-tazobactam (ZOSYN) IVPB 2.25 g  Status:  Discontinued     2.25 g 100 mL/hr over 30  Minutes Intravenous Every 6 hours 03/11/20 1058 03/11/20 1641   03/06/20 1200  piperacillin-tazobactam (ZOSYN) IVPB 3.375 g  Status:  Discontinued     3.375 g 12.5 mL/hr over 240 Minutes Intravenous Every 8 hours 03/06/20 0739 03/11/20 1058   03/05/20 1400  piperacillin-tazobactam (ZOSYN) IVPB 2.25 g  Status:  Discontinued     2.25 g 100 mL/hr over 30 Minutes Intravenous Every 8 hours 03/05/20 0752 03/06/20 0739   03/04/20 2200  piperacillin-tazobactam (ZOSYN) IVPB 3.375 g  Status:  Discontinued     3.375 g 12.5 mL/hr over 240 Minutes Intravenous Every 8 hours 03/03/20 2032 03/05/20 0752   03/03/20 1545  cefoTEtan (CEFOTAN) 2 g in sodium chloride 0.9 % 100 mL IVPB     2 g 200 mL/hr over 30 Minutes Intravenous On call to O.R. 03/03/20 1532 03/04/20 0444   03/03/20 1345  piperacillin-tazobactam (ZOSYN) IVPB 3.375 g     3.375 g 12.5 mL/hr over 240 Minutes Intravenous  Once 03/03/20 1330 03/03/20 1507       Assessment/Plan COPD VDRF - weaning AKI - Cr normalized ABL anemia -Hgb 8.8, stable Septic shock - IV abx and antifungalhopefully wean to extubate soon Malnutrition - on TPN  Perforated sigmoid colon Proximal rectal cancer with obstruction S/p emergent Low anterior resection/ colostomy by Dr. Marcello Moores 3/21 S/p exploratory laparotomy and colon resection with transverse colostomy 3/31 Dr. Hassell Done - BID wet to dry dressing changes to midline abdominal wound - WOC team following for ostomy teaching  - JP drain output serous - Ostomy viable but no significant bowel function. WBC downtrending but remains elevated, will discuss with MD but likely plan for repeat CT a/p today.   FEN -NPO/OG to LIWS, TPN. If extubated ok for clear liquids VTE - SCDs,SQ heparin ID - Zosyn3/21>3/29; Eraxis 3/31>>4/5, Merrem 3/29>> Foley -in place   LOS: 16 days    Wellington Hampshire, [facility] 03/19/2020, 8:12 AM Please see Amion for pager number during day hours  7:00am-4:30pm

## 2020-03-19 NOTE — Progress Notes (Signed)
NAME:  Tracy [name], MRN:  [id number], DOB:  02-07-1951, LOS: 73 ADMISSION DATE:  03/03/2020, CONSULTATION DATE:  3/27  REFERRING MD:  Ninfa Linden, CHIEF COMPLAINT:  Abdominal pain   Brief History   69 y/o female admitted 3/21 with bowel perforation requiring colostomy and lower anterior resection in setting of newly diagnosed proximal rectal cancer with sigmoid perforation.  Post op developed septic shock, HCAP AKI ileus and acute hypoxemic respiratory failure requiring intubation.   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 Valeta Harms. Not on O2.  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, metastatic carcinoma in 3/15 lymph nodes  LE [name] 3/27 >> negative for DVT  ECHO 3/27 >> poor windows, normal LV function, severely dilated RV with moderately reduced function, RV volume and pressure overload, mildly dilated RA  V/Q Scan 3/30 >> low probability for PE  Micro Data:  COVID 3/21 >> Negative MRSA PCR 3/21 >> Negative Blood cultures 3/27 >> negative  Urine culture 3/26 >> insignificant growth  Tracheal Aspirate 3/29 >> Saccharomyces cerevisiae Trach aspirate 4/2>>  Antimicrobials:  Zosyn 3/21  >> 3/29 Anidulafungin 3/31 >> 4/5 Meropenem 3/29 >>   Interim history/subjective:   Failed SBT again   Objective   Blood pressure (!) 94/51, pulse 77, temperature 98.2 F (36.8 C), temperature source Axillary, resp. rate (!) 29, height 5\' 2"  (1.575 m), weight 70.4 kg, last menstrual period 12/14/2000, SpO2 97 %.    Vent Mode: PRVC FiO2 (%):  [30 %] 30 % Set Rate:  [10 bmp-40 bmp] 10 bmp Vt Set:  [400 mL] 400 mL PEEP:  [5 cmH20] 5 cmH20 Plateau Pressure:  [16 cmH20-26 cmH20] 26 cmH20   Intake/Output Summary (Last 24 hours) at 03/19/2020 0733 Last data filed at 03/19/2020 [id number] Gross per 24 hour  Intake 2340.89 ml  Output 2026 ml  Net 314.89 ml   Filed Weights   03/17/20 0400 03/18/20 0353 03/19/20 0423  Weight: 73.1 kg 69.3 kg 70.4 kg    Examination:  General:  In bed on vent HENT: NCAT ETT in place PULM: CTA B, vent supported breathing CV: RRR, no mgr GI: minimal bowel sounds, drain in place, ostomy in place MSK: normal bulk and tone Neuro: sedated on vent  4/3 CXR images personally reviewed> emphysema bilaterally, ETT in place  Resolved Hospital Problem list     Assessment & Plan:  Acute hypoxemic respiratory failure> pressure support trial limited by anxiety/pain and severe lung disease, unclear if will be able to be liberated from ventialtor.  Endotracheal tube contributing to anxiety. Baseline GOLD III/IV COPD;   Continue pressure support trial Discussed risks and benefits of tracheostomy at length with patient and with general surgery team.  Though her  prognosis remains guarded without certainty of survival I don't think that this is a terminal situation.  She and her husband have discussed this at length and both have a history of exposure to ex-spouses who both required long term medical support. VAP prevention measures Plan tracheostomy today Continue to wean systemic steroids  Bowel perforation/partial colectomy Per general surgery  Colon cancer Per  general surgery  Anasarca, neg negative on 4/4, exam likely improved Hypernatremia  AKI Increase D5W 100cc/hr Monitor BMET and UOP Replace electrolytes as needed  Hypertension Monitor hemodynamics  Moderate Malnutrition Continue TPN Enteric feeding decision per general surgery  Acute anemia, stable 4/4 without bleeding Monitor for bleeding Transfuse PRBC for Hgb < 7 gm/dL  Hyperglycemia SSI + Glargine  Need for sedation for mechanical ventilation RASS goal 0 to -1 PAD Protocol: continue fentanyl/precedex, prn versed  Prognosis guarded  Best practice:  Diet: TPN Pain/Anxiety/Delirium protocol (if indicated): yes, as above VAP protocol (if indicated): yes DVT prophylaxis: sub q heparin GI prophylaxis: PPI Glucose control: as above Mobility: bed rest Code Status: full Family Communication: Danny updated at length at bedside today Disposition: remain in ICU  Labs   CBC: Recent Labs  Lab 03/13/20 1141 03/14/20 0544 03/15/20 1046 03/18/20 0441 03/19/20 0415  WBC 26.7* 24.6* 17.7* 21.6* 18.4*  NEUTROABS  --  23.4* 16.3* 19.6*  --   HGB 10.1* 9.3* 9.7* 9.3* 8.8*  HCT 30.8* 28.1* 29.0* 29.6* 28.9*  MCV 93.6 94.6 93.2 96.7 99.0  PLT 240 233 316 481* 450*    Basic Metabolic Panel: Recent Labs  Lab 03/15/20 0348 03/16/20 0355 03/17/20 0226 03/18/20 0441 03/19/20 0415  NA 147* 148* 150* 150* 150*  K 3.3* 3.8 4.5 4.7 5.2*  CL 116* 116* 112* 110 111  CO2 21* 24 27 32 31  GLUCOSE 180* 279* 186* 168* 173*  BUN 70* 68* 79* 93* 86*  CREATININE 1.62* 1.08* 1.03* 0.89 0.75  CALCIUM 8.0* 8.4* 9.5 9.3 9.3  MG 2.3 2.1 2.0 2.0 2.0  PHOS 4.0 2.6 2.4* 3.2 3.0   GFR: Estimated Creatinine Clearance: 63.6 mL/min (by C-G formula based on SCr of 0.75 mg/dL). Recent Labs  Lab 03/14/20 0544 03/15/20 1046 03/18/20 0441 03/19/20 0415  WBC 24.6* 17.7* 21.6* 18.4*    Liver Function Tests: Recent Labs  Lab 03/14/20 0544 03/16/20 0355 03/18/20 0441 03/19/20 0415   AST 27 20 24 23   ALT 27 22 21 22   ALKPHOS 54 60 82 78  BILITOT 1.1 0.8 0.6 0.8  PROT 5.3* 5.0* 5.7* 5.3*  ALBUMIN 3.5 2.8* 3.3* 2.9*   No results for input(s): LIPASE, AMYLASE in the last 168 hours. No results for input(s): AMMONIA in the last 168 hours.  ABG    Component Value Date/Time   PHART 7.398 03/16/2020 2245   PCO2ART 45.6 03/16/2020 2245   PO2ART 70.3 (L) 03/16/2020 2245   HCO3 27.7 03/16/2020 2245   TCO2 24 03/12/2020 1513   ACIDBASEDEF 4.8 (H) 03/12/2020 1738   O2SAT 93.5 03/16/2020 2245     Coagulation Profile: No results for input(s): INR, PROTIME in the last 168 hours.  Cardiac Enzymes: No results for input(s): CKTOTAL, CKMB, CKMBINDEX, TROPONINI in the last 168 hours.  HbA1C: Hgb A1c MFr Bld  Date/Time Value Ref Range Status  03/11/2020 05:00 AM 5.9 (H) 4.8 - 5.6 % Final    Comment:    (NOTE) Pre diabetes:          5.7%-6.4% Diabetes:              >  6.4% Glycemic control for   <7.0% adults with diabetes     CBG: Recent Labs  Lab 03/18/20 0754 03/18/20 1216 03/18/20 1619 03/18/20 1940 03/19/20 0315  GLUCAP 138* 152* 196* 202* 165*     Critical care time: 40 minutes     Roselie Awkward, MD [facility] Pager: [phone] Cell: [phone] If no response, call [phone]

## 2020-03-19 NOTE — Progress Notes (Signed)
Peripherally Inserted Central Catheter Placement  The IV Nurse has discussed with the patient and/or persons authorized to consent for the patient, the purpose of this procedure and the potential benefits and risks involved with this procedure.  The benefits include less needle sticks, lab draws from the catheter, and the patient may be discharged home with the catheter. Risks include, but not limited to, infection, bleeding, blood clot (thrombus formation), and puncture of an artery; nerve damage and irregular heartbeat and possibility to perform a PICC exchange if needed/ordered by physician.  Alternatives to this procedure were also discussed.  Bard Power PICC patient education guide, fact sheet on infection prevention and patient information card has been provided to patient /or left at bedside.    PICC Placement Documentation  PICC Double Lumen 03/19/20 PICC Right Brachial 35 cm 0 cm (Active)  Indication for Insertion or Continuance of Line Administration of hyperosmolar/irritating solutions (i.e. TPN, Vancomycin, etc.) 03/19/20 1551  Exposed Catheter (cm) 0 cm 03/19/20 1551  Site Assessment Clean;Dry;Intact 03/19/20 1551  Lumen #1 Status Flushed;Blood return noted 03/19/20 1551  Lumen #2 Status Flushed;Blood return noted;Saline locked 03/19/20 1551  Dressing Type Transparent 03/19/20 1551  Dressing Status Clean;Dry;Intact;Antimicrobial disc in place 03/19/20 1551  Dressing Change Due 03/26/20 03/19/20 1551       Scotty Court 03/19/2020, 3:53 PM

## 2020-03-20 ENCOUNTER — Inpatient Hospital Stay (HOSPITAL_COMMUNITY): Payer: Medicare HMO

## 2020-03-20 ENCOUNTER — Other Ambulatory Visit: Payer: Self-pay

## 2020-03-20 ENCOUNTER — Encounter (HOSPITAL_COMMUNITY): Payer: Self-pay

## 2020-03-20 LAB — GLUCOSE, CAPILLARY
Glucose-Capillary: 119 mg/dL — ABNORMAL HIGH (ref 70–99)
Glucose-Capillary: 120 mg/dL — ABNORMAL HIGH (ref 70–99)
Glucose-Capillary: 95 mg/dL (ref 70–99)
Glucose-Capillary: 111 mg/dL — ABNORMAL HIGH (ref 70–99)
Glucose-Capillary: 145 mg/dL — ABNORMAL HIGH (ref 70–99)
Glucose-Capillary: 128 mg/dL — ABNORMAL HIGH (ref 70–99)

## 2020-03-20 LAB — COMPREHENSIVE METABOLIC PANEL
ALT: 40 U/L (ref 0–44)
AST: 38 U/L (ref 15–41)
Albumin: 2.7 g/dL — ABNORMAL LOW (ref 3.5–5.0)
Alkaline Phosphatase: 87 U/L (ref 38–126)
Anion gap: 9 (ref 5–15)
BUN: 62 mg/dL — ABNORMAL HIGH (ref 8–23)
CO2: 27 mmol/L (ref 22–32)
Calcium: 8.1 mg/dL — ABNORMAL LOW (ref 8.9–10.3)
Chloride: 101 mmol/L (ref 98–111)
Creatinine, Ser: 0.71 mg/dL (ref 0.44–1.00)
GFR calc Af Amer: >60 mL/min (ref >60)
GFR calc non Af Amer: >60 mL/min (ref >60)
Glucose, Bld: 442 mg/dL — ABNORMAL HIGH (ref 70–99)
Potassium: 3.9 mmol/L (ref 3.5–5.1)
Sodium: 137 mmol/L (ref 135–145)
Total Bilirubin: 0.8 mg/dL (ref 0.3–1.2)
Total Protein: 4.9 g/dL — ABNORMAL LOW (ref 6.5–8.1)

## 2020-03-20 LAB — CULTURE, RESPIRATORY W GRAM STAIN: Gram Stain: NONE SEEN

## 2020-03-20 LAB — PHOSPHORUS: Phosphorus: 3.1 mg/dL (ref 2.5–4.6)

## 2020-03-20 LAB — MAGNESIUM: Magnesium: 1.6 mg/dL — ABNORMAL LOW (ref 1.7–2.4)

## 2020-03-20 LAB — TRIGLYCERIDES: Triglycerides: 163 mg/dL — ABNORMAL HIGH (ref <150)

## 2020-03-20 MED ORDER — NOREPINEPHRINE 4 MG/250ML-% IV SOLN
0.0000 ug/min | INTRAVENOUS | Status: DC
  Administered 2020-03-20 – 2020-03-22 (×3): 2 ug/min via INTRAVENOUS

## 2020-03-20 MED ORDER — ROCURONIUM BROMIDE 50 MG/5ML IV SOLN
50.0000 mg | Freq: Once | INTRAVENOUS | Status: AC
  Administered 2020-03-20: 50 mg via INTRAVENOUS

## 2020-03-20 MED ORDER — MIDAZOLAM HCL 2 MG/2ML IJ SOLN
INTRAMUSCULAR | Status: AC
  Filled 2020-03-20: qty 6

## 2020-03-20 MED ORDER — SODIUM CHLORIDE 0.9% FLUSH
5.0000 mL | Freq: Three times a day (TID) | INTRAVENOUS | Status: DC
  Administered 2020-03-20 – 2020-03-23 (×9): 5 mL

## 2020-03-20 MED ORDER — MIDAZOLAM HCL 2 MG/2ML IJ SOLN
INTRAMUSCULAR | Status: AC | PRN
  Administered 2020-03-20 (×2): 1 mg via INTRAVENOUS

## 2020-03-20 MED ORDER — LIDOCAINE HCL (PF) 1 % IJ SOLN
INTRAMUSCULAR | Status: AC | PRN
  Administered 2020-03-20 (×2): 10 mL

## 2020-03-20 MED ORDER — POTASSIUM CHLORIDE 10 MEQ/100ML IV SOLN
10.0000 meq | INTRAVENOUS | Status: AC
  Administered 2020-03-20 (×2): 10 meq via INTRAVENOUS
  Filled 2020-03-20 (×2): qty 100

## 2020-03-20 MED ORDER — TRAVASOL 10 % IV SOLN
INTRAVENOUS | Status: AC
  Filled 2020-03-20: qty 1344

## 2020-03-20 MED ORDER — MAGNESIUM SULFATE 2 GM/50ML IV SOLN
2.0000 g | Freq: Once | INTRAVENOUS | Status: AC
  Administered 2020-03-20: 2 g via INTRAVENOUS
  Filled 2020-03-20: qty 50

## 2020-03-20 MED ORDER — HEPARIN SODIUM (PORCINE) 5000 UNIT/ML IJ SOLN
5000.0000 [IU] | Freq: Three times a day (TID) | INTRAMUSCULAR | Status: DC
  Administered 2020-03-21 – 2020-04-05 (×47): 5000 [IU] via SUBCUTANEOUS
  Filled 2020-03-20 (×47): qty 1

## 2020-03-20 MED ORDER — NOREPINEPHRINE 4 MG/250ML-% IV SOLN
INTRAVENOUS | Status: AC
  Administered 2020-03-20: 2 ug/min via INTRAVENOUS
  Filled 2020-03-20: qty 250

## 2020-03-20 MED ORDER — FENTANYL CITRATE (PF) 100 MCG/2ML IJ SOLN
INTRAMUSCULAR | Status: AC
  Filled 2020-03-20: qty 4

## 2020-03-20 NOTE — Procedures (Signed)
Bronchoscopy Procedure Note IVANNAH GRANITO [id number] 04-Oct-1951  Procedure: Bronchoscopy Indications: Direct visualization for tracheostomy placement   Procedure Details Consent: Risks of procedure as well as the alternatives and risks of each were explained to the (patient/caregiver).  Consent for procedure obtained. Time Out: Verified patient identification, verified procedure, site/side was marked, verified correct patient position, special equipment/implants available, medications/allergies/relevent history reviewed, required imaging and test results available.  Performed  In preparation for procedure, patient was given 100% FiO2 and bronchoscope lubricated. Sedation: Precedex, Fentanyl, Etomidate and Rocuronium   Airway entered and inspected to level of carina.  Airway normal in appearance.   Procedures performed: placement of tracheostomy, directly visualized, no penetration of posterior wall with placement. Scope removed from ETT and placement confirmed via tracheostomy. No bleeding.   Evaluation Hemodynamic Status: BP stable throughout; O2 sats: stable throughout Patient's Current Condition: stable Specimens:  None Complications: No apparent complications Patient tolerated the procedure well.   Procedure performed under direct supervision of Dr. Valeta Harms.   Noe Gens, MSN, NP-C [facility] 03/20/2020, 3:07 PM   Please see [email or web address] for pager details.

## 2020-03-20 NOTE — Consult Note (Signed)
Chief Complaint: Patient was seen in consultation today for CT-guided drainage of left abdominal fluid collection Chief Complaint  Patient presents with  . Abdominal Pain  . Nausea    Referring Physician(s): Byerly,F  Supervising Physician: Daryll Brod  Patient Status: [facility] - In-pt  History of Present Illness: Tracy [name] is a 69 y.o. female with past medical history of asthma/COPD and newly diagnosed proximal rectal cancer with sigmoid perforation, status post emergent low anterior resection/colostomy on 03/03/2020 followed by exploratory lap and colon resection with transverse colostomy on 3/31.  She has a right lower quadrant surgical drain in place.  Postop she developed septic shock with pneumonia, acute kidney injury, ileus and acute hypoxemic respiratory failure requiring intubation. She is scheduled for trach placement later today.  Follow-up CT abdomen pelvis performed yesterday revealed: 1. 5.5 x 3.2 cm fluid collection is noted along the greater curvature of the proximal stomach. 2. Surgical drain is again noted in the pelvis with tip in left lower quadrant. 3. Interval development of crescent-shaped fluid collection measuring 16.4 x 3.3 cm in the epigastric region and left upper quadrant of the abdomen which may extend into the left lower quadrant. Potentially this may represent abscess or developing abscess. Multiple other smaller fluid collections are noted which may represent small abscesses. 4. 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 5. Mild amount of free fluid is noted in the posterior pelvis. 6. Moderate anasarca is noted.  She is currently afebrile, WBC 18.4, hemoglobin 8.8, plts 450k, creatinine normal. COVID 19 neg. Request now received from surgery for image guided drainage of left abdominal fluid collection.  Past Medical History:  Diagnosis Date  . Asthma   . COPD (chronic obstructive pulmonary disease) ([place]) 2011   FeV1 31% predicted  FeV1/FVX 47 %  . Hemorrhoid   . Osteopenia   . Seasonal allergies    takes Claritin daily prn  . Vitamin D deficiency    takes Vit d every 14 days    Past Surgical History:  Procedure Laterality Date  . AUGMENTATION MAMMAPLASTY     saline  . BREAST ENHANCEMENT SURGERY    . EXAMINATION UNDER ANESTHESIA  10/14/2012   Procedure: EXAM UNDER ANESTHESIA;  Surgeon: Gayland Curry, MD,FACS;  Location: [place];  Service: General;  Laterality: N/A;  rectal exam under anesthesia excisional hemorrhoidectomy hemorrhoidal banding x two  . EXAMINATION UNDER ANESTHESIA  02/03/2013   excision hemorrhoidal tissue  . FOOT SURGERY     left bunionectomy  . HEMORRHOIDECTOMY WITH HEMORRHOID BANDING  10/14/2012   Procedure: [id number] WITH HEMORRHOID BANDING;  Surgeon: Gayland Curry, MD,FACS;  Location: [place];  Service: General;  Laterality: N/A;  . LAPAROTOMY N/A 03/03/2020   Procedure: low anterior resection end colostomy;  Surgeon: [name], MD;  Location: [facility];  Service: General;  Laterality: N/A;  . LAPAROTOMY N/A 03/12/2020   Procedure: EXPLORATORY LAPAROTOMY WITH BOWEL RESECTION AND COLOSTOMY;  Surgeon: [name], MD;  Location: [facility];  Service: General;  Laterality: N/A;  . OPEN REDUCTION INTERNAL FIXATION (ORIF) DISTAL RADIAL FRACTURE Right 11/01/2018   Procedure: OPEN REDUCTION INTERNAL FIXATION (ORIF) DISTAL RADIAL FRACTURE;  Surgeon: [name], MD;  Location: [place];  Service: Orthopedics;  Laterality: Right;  . TONSILLECTOMY  age 87    recurrent otitis media    Allergies: Codeine  Medications: Prior to Admission medications   Medication Sig Start Date End Date Taking? Authorizing Provider  acetaminophen (TYLENOL) 325 MG tablet Take  650 mg by mouth every 6 (six) hours as needed for moderate pain.   Yes [provider]  albuterol (PROVENTIL) (2.5 MG/3ML) 0.083% nebulizer solution INHALE 3ML VIA NEBULIZER EVERY 4 HOURS AS NEEDED FOR SHORTNESS OF  BREATH 11/23/19  Yes Icard, Bradley L, DO  b complex vitamins tablet Take 1 tablet by mouth daily.   Yes [provider]  Fluticasone-Umeclidin-Vilant (TRELEGY ELLIPTA) 100-62.5-25 MCG/INH AEPB Inhale 1 puff into the lungs daily. 02/15/20  Yes Icard, Bradley L, DO  VITAMIN D, CHOLECALCIFEROL, PO Take 1 tablet by mouth daily.   Yes [provider]     Family History  Problem Relation Age of Onset  . Osteoporosis Mother   . Other Father        [place] in snow, broke ankle, blood clot to lungs  . Heart murmur Sister   . Other Brother        h/o being hit by lightening  . Parkinsonism Brother   . Asthma Son   . Allergies Son   . Otitis media Son   . Other Sister        CML  . Asthma Son   . Allergies Son   . Pancreatic cancer Brother        diag 12/17/2013-deceased 02-16-14    Social History   Socioeconomic History  . Marital status: Married    Spouse name: Not on file  . Number of children: Not on file  . Years of education: Not on file  . Highest education level: Not on file  Occupational History  . Not on file  Tobacco Use  . Smoking status: Former Smoker    Packs/day: 0.50    Years: 20.00    Pack years: 10.00    Types: Cigarettes    Quit date: 09/14/1999    Years since quitting: 20.5  . Smokeless tobacco: Never Used  Substance and Sexual Activity  . Alcohol use: Yes    Alcohol/week: 0.0 standard drinks    Comment: glass of wine  . Drug use: No  . Sexual activity: Yes    Partners: Male    Birth control/protection: Post-menopausal  Other Topics Concern  . Not on file  Social History Narrative  . Not on file   Social Determinants of Health   Financial Resource Strain:   . Difficulty of Paying Living Expenses:   Food Insecurity:   . Worried About Charity fundraiser in the Last Year:   . Arboriculturist in the Last Year:   Transportation Needs:   . Film/video editor (Medical):   Marland Kitchen Lack of Transportation (Non-Medical):   Physical Activity:    . Days of Exercise per Week:   . Minutes of Exercise per Session:   Stress:   . Feeling of Stress :   Social Connections:   . Frequency of Communication with Friends and Family:   . Frequency of Social Gatherings with Friends and Family:   . Attends Religious Services:   . Active Member of Clubs or Organizations:   . Attends Archivist Meetings:   Marland Kitchen Marital Status:       Review of Systems intubated; sedated  Vital Signs: BP (!) 250/97   Pulse 95   Temp 97.8 F (36.6 C) (Axillary)   Resp 20   Ht 5\' 2"  (1.575 m)   Wt 154 lb 15.7 oz (70.3 kg)   LMP 12/14/2000   SpO2 96%   BMI 28.35 kg/m   Physical  Exam patient intubated/sedated.  Husband in room.  Chest clear to auscultation bilaterally.  Heart with regular rate/ rhythm.  Abdomen soft with right lower quadrant surgical drain intact, ostomy intact.  Imaging: CT ABDOMEN PELVIS WO CONTRAST  Result Date: 03/12/2020 CLINICAL DATA:  Abdominal abscess. Rectal cancer status post colon perforation. EXAM: CT ABDOMEN AND PELVIS WITHOUT CONTRAST TECHNIQUE: Multidetector CT imaging of the abdomen and pelvis was performed following the standard protocol without IV contrast. COMPARISON:  03/08/2020 FINDINGS: Lower chest: Emphysema noted in the lung bases with small bilateral pleural effusions. Hepatobiliary: Cirrhotic liver morphology described previously less evident on today's study. The tiny left hepatic lobe lesion described previously not well demonstrated on today's noncontrast exam. No focal abnormality in the liver on this study without intravenous contrast. Gallbladder is distended. No intrahepatic or extrahepatic biliary dilation. Pancreas: No focal mass lesion. No dilatation of the main duct. No intraparenchymal cyst. No peripancreatic edema. Spleen: No splenomegaly. No focal mass lesion. Adrenals/Urinary Tract: No adrenal nodule or mass. Both kidneys show retained intravenous contrast from the exam 4 days ago consistent with  renal dysfunction. No hydronephrosis. No hydroureter. Foley catheter decompresses the urinary bladder. Stomach/Bowel: Stomach is distended with gas and contrast material. NG tube is in the mid stomach. Duodenum is normally positioned as is the ligament of Treitz. Small bowel shows diffuse distension without 80 frankly obstructive appearance. Colon is also diffusely distended with circumferential wall thickening in the splenic flexure and descending colon extending out to the sigmoid end colostomy. Short rectal stump evident. Vascular/Lymphatic: There is abdominal aortic atherosclerosis without aneurysm. There is no gastrohepatic or hepatoduodenal ligament lymphadenopathy. No retroperitoneal or mesenteric lymphadenopathy. No pelvic sidewall lymphadenopathy. Reproductive: Uterus not well seen.  There is no adnexal mass. Other: Intraperitoneal free air new since 03/08/2020 with relatively large pocket seen in the upper abdomen (axial 15/2), left subdiaphragmatic space anterior to the stomach on 9/2, and anterior abdomen on 37/2. Small volume free fluid noted in the pelvis/cul-de-sac. Trace fluid noted adjacent to the spleen. Small collections are seen in both upper quadrants with some high attenuation fluid in the anterior collections in the left upper quadrant (images 34 and 37 of series 2). Diffuse mesenteric edema evident with small fluid collections identified in the omentum of the hepatic flexure (39/2) and in the anterior left abdomen (37/2). Surgical drain again noted in the posterior pelvis. Musculoskeletal: Interval progression of diffuse body wall edema. No worrisome lytic or sclerotic osseous abnormality. IMPRESSION: 1. Exam is limited by lack of intravenous contrast, motion, and artifact from the patient's arms adjacent to the torso. Since 03/08/2020, there has been development of relatively large pockets of intraperitoneal free air. While intraperitoneal gas would not be unexpected on postoperative day 9,  this gas is new since an intervening study of 03/08/2020 and given the relatively large volume certainly raises concern for bowel perforation. No source for the intraperitoneal free air is evident on this study. There is a surgical drain in the pelvis, but there is no free gas around the drain itself to suggest that it represents the source. 2. New circumferential wall thickening in the splenic flexure, descending colon and sigmoid colon leading into the end colostomy. Infection/inflammation would be a consideration. Ischemia cannot be excluded. 3. Relatively small volume intraperitoneal free fluid. High attenuation small fluid collections in the left upper abdomen may reflect hemorrhage, infection or residua from prior perforation. 4. Interval progression of diffuse body wall edema. 5. Residual contrast material in the renal parenchyma from prior  imaging, compatible with renal dysfunction. Electronically Signed   By: Misty Stanley M.D.   On: 03/12/2020 12:51   DG Abd 1 View  Result Date: 03/09/2020 CLINICAL DATA:  OG tube placement EXAM: ABDOMEN - 1 VIEW COMPARISON:  None. FINDINGS: A feeding tube is identified with distal tip in the proximal stomach. Air is noted in the colon. IMPRESSION: Feeding tube identified with distal tip in the proximal stomach. Electronically Signed   By: Abelardo Diesel M.D.   On: 03/09/2020 12:34   DG Abd 1 View  Result Date: 03/07/2020 CLINICAL DATA:  Abdominal pain EXAM: ABDOMEN - 1 VIEW COMPARISON:  CT 03/03/2020 FINDINGS: Gaseous distention of the stomach and transverse colon. No visible free air. Surgical drain projects over the pelvis. No evidence of obstruction. IMPRESSION: No evidence of bowel obstruction or free air. Electronically Signed   By: Rolm Baptise M.D.   On: 03/07/2020 03:37   CT CHEST W CONTRAST  Result Date: 03/19/2020 CLINICAL DATA:  Abdominal abscess or infection. EXAM: CT CHEST, ABDOMEN, AND PELVIS WITH CONTRAST TECHNIQUE: Multidetector CT imaging of the  chest, abdomen and pelvis was performed following the standard protocol during bolus administration of intravenous contrast. CONTRAST:  159mL OMNIPAQUE IOHEXOL 300 MG/ML  SOLN COMPARISON:  March 12, 2020. FINDINGS: CT CHEST FINDINGS Cardiovascular: No significant vascular findings. Normal heart size. No pericardial effusion. Mediastinum/Nodes: Endotracheal tube is in grossly good position. Thyroid gland is unremarkable. No adenopathy is noted. Nasogastric tube is seen passing through esophagus into stomach. Lungs/Pleura: No pneumothorax or pleural effusion is noted. Minimal right posterior basilar subsegmental atelectasis is noted. Musculoskeletal: No chest wall mass or suspicious bone lesions identified. CT ABDOMEN PELVIS FINDINGS Hepatobiliary: No focal liver abnormality is seen. No gallstones, gallbladder wall thickening, or biliary dilatation. Pancreas: Unremarkable. No pancreatic ductal dilatation or surrounding inflammatory changes. Spleen: Normal in size without focal abnormality. Adrenals/Urinary Tract: Adrenal glands are unremarkable. Kidneys are normal, without renal calculi, focal lesion, or hydronephrosis. Bladder is unremarkable. Stomach/Bowel: Nasogastric tube tip is seen in proximal stomach. 5.5 x 3.2 cm fluid collection is noted along the greater curvature of the proximal stomach. 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 No definite evidence of bowel obstruction is noted. Vascular/Lymphatic: Aortic atherosclerosis. No enlarged abdominal or pelvic lymph nodes. Reproductive: Uterus and bilateral adnexa are unremarkable. Other: Mild amount of free fluid is noted in the posterior pelvis. Surgical drain is again noted in the pelvis with tip in the left lower quadrant. There is interval development of crescent-shaped fluid collection measuring 16.4 x 3.3 cm in the epigastric region and left upper quadrant of the abdomen which may extend into the left lower quadrant. Potentially this may represent  abscess or developing abscess. Multiple other smaller fluid collections are noted which may represent small abscesses. Moderate anasarca is noted. Musculoskeletal: No acute or significant osseous findings. IMPRESSION: 1. 5.5 x 3.2 cm fluid collection is noted along the greater curvature of the proximal stomach. 2. Surgical drain is again noted in the pelvis with tip in left lower quadrant. 3. Interval development of crescent-shaped fluid collection measuring 16.4 x 3.3 cm in the epigastric region and left upper quadrant of the abdomen which may extend into the left lower quadrant. Potentially this may represent abscess or developing abscess. Multiple other smaller fluid collections are noted which may represent small abscesses. 4. 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 5. Mild amount of free fluid is noted in the posterior pelvis. 6. Moderate anasarca is noted. Aortic  Atherosclerosis (ICD10-I70.0). Electronically Signed   By: Marijo Conception M.D.   On: 03/19/2020 17:36   NM Pulmonary Perfusion  Result Date: 03/12/2020 CLINICAL DATA:  Respiratory failure EXAM: NUCLEAR MEDICINE PERFUSION LUNG SCAN TECHNIQUE: Perfusion images were obtained in multiple projections after intravenous injection of radiopharmaceutical. Ventilation scans intentionally deferred if perfusion scan and chest x-ray adequate for interpretation during COVID 19 epidemic. Views: Anterior, posterior, RPO, LPO, LAO, LPO RADIOPHARMACEUTICALS:  1.5 mCi Tc-77m MAA IV COMPARISON:  Chest radiograph March 12, 2020. CT abdomen and pelvis including lower lung regions March 08, 2020 FINDINGS: There is photopenia in the mid and lower lung zones in areas that on chest radiograph appear consistent with extensive underlying emphysematous change. Recent CT confirms extensive emphysematous change in the mid lower lung regions. No perfusion defects evident beyond areas of decreased uptake, felt to be due to extensive emphysema. IMPRESSION: Symmetric mid to  lower lobe areas of photopenia, likely due to underlying bullous disease seen on recent chest radiograph and chest CT. No other perfusion defects evident. This study is considered relatively low probability for pulmonary embolism in this circumstance. If there remains strong clinical concern for potential pulmonary embolus, CT angiogram chest would be a reasonable consideration. If contraindication to iodinated contrast material administration, it may be reasonable to consider lower extremity duplex ultrasound to assess for possible lower extremity deep venous thrombosis. Electronically Signed   By: Lowella Grip III M.D.   On: 03/12/2020 12:17   CT ABDOMEN PELVIS W CONTRAST  Result Date: 03/19/2020 CLINICAL DATA:  Abdominal abscess or infection. EXAM: CT CHEST, ABDOMEN, AND PELVIS WITH CONTRAST TECHNIQUE: Multidetector CT imaging of the chest, abdomen and pelvis was performed following the standard protocol during bolus administration of intravenous contrast. CONTRAST:  170mL OMNIPAQUE IOHEXOL 300 MG/ML  SOLN COMPARISON:  March 12, 2020. FINDINGS: CT CHEST FINDINGS Cardiovascular: No significant vascular findings. Normal heart size. No pericardial effusion. Mediastinum/Nodes: Endotracheal tube is in grossly good position. Thyroid gland is unremarkable. No adenopathy is noted. Nasogastric tube is seen passing through esophagus into stomach. Lungs/Pleura: No pneumothorax or pleural effusion is noted. Minimal right posterior basilar subsegmental atelectasis is noted. Musculoskeletal: No chest wall mass or suspicious bone lesions identified. CT ABDOMEN PELVIS FINDINGS Hepatobiliary: No focal liver abnormality is seen. No gallstones, gallbladder wall thickening, or biliary dilatation. Pancreas: Unremarkable. No pancreatic ductal dilatation or surrounding inflammatory changes. Spleen: Normal in size without focal abnormality. Adrenals/Urinary Tract: Adrenal glands are unremarkable. Kidneys are normal, without  renal calculi, focal lesion, or hydronephrosis. Bladder is unremarkable. Stomach/Bowel: Nasogastric tube tip is seen in proximal stomach. 5.5 x 3.2 cm fluid collection is noted along the greater curvature of the proximal stomach. 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 No definite evidence of bowel obstruction is noted. Vascular/Lymphatic: Aortic atherosclerosis. No enlarged abdominal or pelvic lymph nodes. Reproductive: Uterus and bilateral adnexa are unremarkable. Other: Mild amount of free fluid is noted in the posterior pelvis. Surgical drain is again noted in the pelvis with tip in the left lower quadrant. There is interval development of crescent-shaped fluid collection measuring 16.4 x 3.3 cm in the epigastric region and left upper quadrant of the abdomen which may extend into the left lower quadrant. Potentially this may represent abscess or developing abscess. Multiple other smaller fluid collections are noted which may represent small abscesses. Moderate anasarca is noted. Musculoskeletal: No acute or significant osseous findings. IMPRESSION: 1. 5.5 x 3.2 cm fluid collection is noted along the greater curvature of the  proximal stomach. 2. Surgical drain is again noted in the pelvis with tip in left lower quadrant. 3. Interval development of crescent-shaped fluid collection measuring 16.4 x 3.3 cm in the epigastric region and left upper quadrant of the abdomen which may extend into the left lower quadrant. Potentially this may represent abscess or developing abscess. Multiple other smaller fluid collections are noted which may represent small abscesses. 4. 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 5. Mild amount of free fluid is noted in the posterior pelvis. 6. Moderate anasarca is noted. Aortic Atherosclerosis (ICD10-I70.0). Electronically Signed   By: Marijo Conception M.D.   On: 03/19/2020 17:36   CT ABDOMEN PELVIS W CONTRAST  Result Date: 03/08/2020 CLINICAL DATA:  Hyperkalemia, metabolic  acidosis, history of perforated sigmoid colon and proximal rectal cancer with obstruction status post surgery, elevated white blood cell count EXAM: CT ABDOMEN AND PELVIS WITH CONTRAST TECHNIQUE: Multidetector CT imaging of the abdomen and pelvis was performed using the standard protocol following bolus administration of intravenous contrast. CONTRAST:  135mL OMNIPAQUE IOHEXOL 300 MG/ML  SOLN COMPARISON:  03/03/2020 FINDINGS: Lower chest: Diffuse emphysema is noted. Trace bilateral pleural effusions. The heart is unremarkable. Hepatobiliary: Indeterminate 1.4 cm subcapsular hypodensity left lobe liver segment 4A. In light of the newly diagnosed rectal cancer, metastatic disease cannot be excluded. Nuclear medicine PET scan may be useful. Nodular contour of the liver capsule unchanged consistent with cirrhosis. Gallbladder is unremarkable. No biliary dilation. Pancreas: Unremarkable. No pancreatic ductal dilatation or surrounding inflammatory changes. Spleen: Normal in size without focal abnormality. Adrenals/Urinary Tract: Scattered areas of renal cortical scarring are seen, left greater than right, stable. No obstructive uropathy. The bladder is unremarkable. Adrenals are normal. Stomach/Bowel: There is been interval distal colon resection with a diverting colostomy seen within the left mid abdomen. Moderate residual stool within the colon. No evidence of bowel obstruction. Dilated loops of small bowel with scattered small bowel gas fluid levels may reflect postoperative ileus. Vascular/Lymphatic: Minimal atherosclerosis of the aorta. No pathologic adenopathy within the abdomen or pelvis. Reproductive: Status post hysterectomy. No adnexal masses. Other: There is diffuse subcutaneous edema which has developed in the interim. Trace free fluid within the abdomen and pelvis. I do not see any fluid collection or abscess at this time. Small amount of gas is seen within the lower pelvis adjacent to an indwelling surgical  drain. Postsurgical changes are seen from midline laparotomy with surgical packing in place. Musculoskeletal: No acute or destructive bony lesions. Reconstructed images demonstrate no additional findings. IMPRESSION: 1. Interval midline laparotomy with distal colon resection and diverting left lower quadrant colostomy. 2. Diffuse small bowel dilatation with gas fluid levels most consistent with postoperative ileus. 3. Trace ascites within the abdomen and pelvis. No fluid collection or abscess at this time. Surgical drain within the lower pelvis. 4. Indeterminate 1.4 cm subcapsular liver hypodensity. In light of newly diagnosed rectal cancer, metastatic disease cannot be excluded. PET CT may be useful for further evaluation. 5. Interval development of trace bilateral pleural effusions and diffuse body wall edema. Electronically Signed   By: Randa Ngo M.D.   On: 03/08/2020 11:32   CT ABDOMEN PELVIS W CONTRAST  Result Date: 03/03/2020 CLINICAL DATA:  Acute generalized abdominal pain. Neutropenia. Question of peritonitis. Severe abdominal pain and nausea since yesterday. EXAM: CT ABDOMEN AND PELVIS WITH CONTRAST TECHNIQUE: Multidetector CT imaging of the abdomen and pelvis was performed using the standard protocol following bolus administration of intravenous contrast. CONTRAST:  43mL OMNIPAQUE IOHEXOL  300 MG/ML  SOLN COMPARISON:  None. FINDINGS: Lower chest: Panlobular emphysematous changes at the lung bases. The heart size is normal. Hepatobiliary: The gallbladder is present and is distended. No pericholecystic fluid or calcified stones. Common bile duct normal in caliber. Liver is normal in density. A subcapsular oval hyperdense mass is identified within the MEDIAL segment of the LEFT hepatic lobe measuring 1.6 x 1.0 centimeters on image 19 of series 2. Pancreas: Unremarkable. No pancreatic ductal dilatation or surrounding inflammatory changes. Spleen: Normal in size without focal abnormality.  Adrenals/Urinary Tract: Adrenal glands are normal in appearance. Renal scarring bilaterally. No hydronephrosis. No ureteral obstruction. Urinary bladder is decompressed. Stomach/Bowel: Small hiatal hernia. Stomach is otherwise unremarkable. Jejunal loops have a thickened wall but appear normal in caliber. There is wall thickening, wall enhancement, and associated inflammatory changes surrounding loops within the pelvis, likely ileal loops. The appendix is well seen and has a normal appearance. There is impacted stool throughout the ascending, transverse colon and splenic flexure. There is abrupt transition of caliber of the colon at the level of the proximal ascending colon where there is transition to attenuated proximal descending colon. In this region there is a collection of fluid and gas consistent with perforation. This collection measures 4.8 x 6.0 centimeters, seen LATERAL to the LEFT kidney and below the inferior aspect of the spleen. No discrete mass identified. Vascular/Lymphatic: There is atherosclerotic calcification of the abdominal aorta not associated with aneurysm. No significant abdominal adenopathy. Reproductive: Uterus is present. No adnexal mass. Other: Large amounts of free intraperitoneal air within the UPPER abdomen. Small locule sub gas are identified in the LEFT UPPER QUADRANT, adjacent to the spleen and stomach. Trace amount of ascites within the pelvis. Musculoskeletal: No acute or significant osseous findings. IMPRESSION: 1. Free intraperitoneal air consistent perforation. Most likely site of perforation is in the distal splenic flexure/proximal descending colon where there is a collection of air and gas measuring 6 centimeters. No obvious soft tissue mass identified in this region. At this site, there is abrupt transition of dilated, stool-filled colon to completely decompressed proximal descending colon. 2. Thickened, inflamed loops of jejunum are identified within the pelvis and are  likely reactive. 3. Small hiatal hernia. 4. Benign-appearing 1.6 centimeter mass the LEFT hepatic lobe. Recommend comparison with prior studies if available. 5.  Emphysema (ICD10-J43.9). 6.  Aortic Atherosclerosis (ICD10-I70.0). 7. Bilateral renal scarring. These results were called by telephone at the time of interpretation on 03/03/2020 at 2:30 pm to provider [facility] who verbally acknowledged these results. Electronically Signed   By: Nolon Nations M.D.   On: 03/03/2020 14:35   DG Chest Port 1 View  Result Date: 03/20/2020 CLINICAL DATA:  Respiratory failure. EXAM: PORTABLE CHEST 1 VIEW COMPARISON:  03/19/2020 FINDINGS: The endotracheal tube is 4.5 cm above the carina. The NG tube is coursing down the esophagus and into the stomach. The left IJ central venous catheter has been removed. There is a new right-sided PICC line with its tip in good position in the distal SVC. Stable underlying emphysematous changes. Upper lobe pulmonary scarring. No acute infiltrates or effusions. IMPRESSION: 1. New right-sided PICC line in good position. 2. Removal of left IJ central venous catheter. 3. Stable ET tube and NG tube. 4. Underlying emphysematous changes but no acute pulmonary findings. Electronically Signed   By: Marijo Sanes M.D.   On: 03/20/2020 07:25   DG CHEST PORT 1 VIEW  Result Date: 03/19/2020 CLINICAL DATA:  Respiratory failure EXAM: PORTABLE CHEST  1 VIEW COMPARISON:  Three days ago FINDINGS: Endotracheal tube tip is halfway between the clavicular heads and carina. Left IJ line with tip at the SVC. The enteric tube at least reaches the stomach. Large lung volumes with lucency at the bases. No visible effusion or pneumothorax. No airspace disease. IMPRESSION: Stable hardware positioning and hyperinflated/lucent lung bases. Electronically Signed   By: Monte Fantasia M.D.   On: 03/19/2020 07:26   DG Chest Port 1 View  Result Date: 03/16/2020 CLINICAL DATA:  Respiratory failure EXAM: PORTABLE CHEST  1 VIEW COMPARISON:  Portable exam 2253 hours compared to 03/15/2020 FINDINGS: Tip of endotracheal tube projects 3.0 cm above carina. Nasogastric tube extends into stomach. LEFT jugular central venous catheter with tip projecting over SVC. Chronic prominence of the RIGHT hilum unchanged. Normal heart size, mediastinal contours, and pulmonary vascularity. Emphysematous changes with persistent RIGHT upper lobe infiltrate. Remaining lungs clear. No pleural effusion or pneumothorax. IMPRESSION: Persistent prominence of RIGHT hilum as well as persistent RIGHT upper lobe infiltrate. Either radiographic follow-up until resolution or CT chest recommended to exclude mass/adenopathy at the RIGHT hilum. Electronically Signed   By: Lavonia Dana M.D.   On: 03/16/2020 23:03   DG CHEST PORT 1 VIEW  Result Date: 03/15/2020 CLINICAL DATA:  Respiratory failure. EXAM: PORTABLE CHEST 1 VIEW COMPARISON:  03/12/2020 FINDINGS: Endotracheal tube, NG tube, left IJ line in stable position. Heart size normal. Persistent right perihilar atelectasis. Continued follow-up exams to demonstrate clearing to exclude underlying mass lesion suggested. Persistent unchanged right upper lobe infiltrate. COPD. No pleural effusion. No pneumothorax. Heart size normal. IMPRESSION: 1.  Lines and tubes stable position. 2. Persistent right perihilar atelectasis. Continued follow-up exams suggested to demonstrate clearing and to exclude underlying mass lesion. Persistent unchanged right upper lobe infiltrate. COPD. Electronically Signed   By: Marcello Moores  Register   On: 03/15/2020 05:37   DG CHEST PORT 1 VIEW  Result Date: 03/12/2020 CLINICAL DATA:  Hypoxia. EXAM: PORTABLE CHEST 1 VIEW COMPARISON:  03/10/2020 FINDINGS: Endotracheal tube, central catheter and NG tube all appear in good position. Heart size and pulmonary vascularity are normal. Slight fullness of the right hilum is probably due to rotation. Slight atelectasis in the right midzone. No discrete  pulmonary infiltrates. IMPRESSION: Slight atelectasis in the right midzone. Improved aeration at the right lung apex. Electronically Signed   By: Lorriane Shire M.D.   On: 03/12/2020 11:42   DG CHEST PORT 1 VIEW  Result Date: 03/10/2020 CLINICAL DATA:  Shortness of breath EXAM: PORTABLE CHEST 1 VIEW COMPARISON:  03/09/2020 FINDINGS: Patient is rotated. Endotracheal tube with the tip 4.5 cm above the carina. Nasogastric tube with the tip projecting over the stomach. Left jugular central venous catheter with the tip projecting over the SVC. Linear hyperdensity projecting over the right hilum measuring 4.6 cm in length of uncertain etiology. Persistent patchy right upper lobe airspace disease. No other areas of focal consolidation. Hyperinflated lungs as can be seen with COPD. No pleural effusion or pneumothorax. Stable cardiomediastinal silhouette. No aggressive osseous lesion. IMPRESSION: Support lines and tubing in satisfactory position. Persistent patchy right upper lobe airspace disease consistent with pneumonia. Linear hyperdensity projecting over the right hilum measuring 4.6 cm in length of uncertain etiology. This may reflect a foreign body superficial to the patient versus artifact. Recommend correlation with physical exam. This is unlikely to reflect a retained wire given that the appearance was present on x-ray dated 03/07/2020 prior to placement of a central venous catheter. Electronically Signed  By: Kathreen Devoid   On: 03/10/2020 05:54   DG CHEST PORT 1 VIEW  Result Date: 03/09/2020 CLINICAL DATA:  Endotracheal tube and central line placement EXAM: PORTABLE CHEST 1 VIEW COMPARISON:  March 08, 2020 FINDINGS: In tracheal tube is identified distal tip 4.5 cm from carina. Nasogastric tube is identified distal tip not included on the film but is at least in the stomach. Left central venous line is identified with distal tip in the superior vena cava. Emphysematous changes of bilateral lungs are  identified. Small left pleural effusion is noted. Mild patchy opacity is identified the medial bilateral upper lobes and lateral left upper lobe. Bony structures are stable. IMPRESSION: 1. Endotracheal tube distal tip 4.5 cm from carina. 2. Left central venous line is identified with distal tip in the superior vena cava. No pneumothorax. 3. Opacities of bilateral lungs, developing pneumonia is not excluded. Electronically Signed   By: Abelardo Diesel M.D.   On: 03/09/2020 12:32   DG CHEST PORT 1 VIEW  Result Date: 03/08/2020 CLINICAL DATA:  Shortness of breath. EXAM: PORTABLE CHEST 1 VIEW COMPARISON:  March 08, 2020 FINDINGS: Moderate severe emphysematous changes are noted bilaterally. There are small bilateral pleural effusions with elevation of the left hemidiaphragm. There is no focal infiltrate or pneumothorax. The heart size is normal. Aortic calcifications are noted. There is no acute osseous abnormality. IMPRESSION: 1. Moderate severe emphysematous changes. 2. Small bilateral pleural effusions. 3. No significant short interval change. Electronically Signed   By: Constance Holster M.D.   On: 03/08/2020 22:11   DG CHEST PORT 1 VIEW  Result Date: 03/08/2020 CLINICAL DATA:  Pain with nausea EXAM: PORTABLE CHEST 1 VIEW COMPARISON:  March 07, 2020. FINDINGS: There is stable scarring in the apices, more on the right than the left. Lungs elsewhere clear. Heart size and pulmonary vascularity are normal. No adenopathy. No bone lesions. IMPRESSION: Stable apical region scarring, more on the right than on the left. Lungs elsewhere clear. Cardiac silhouette within normal limits and stable. Electronically Signed   By: Lowella Grip III M.D.   On: 03/08/2020 13:27   DG CHEST PORT 1 VIEW  Result Date: 03/07/2020 CLINICAL DATA:  Aspiration EXAM: PORTABLE CHEST 1 VIEW COMPARISON:  03/06/2020 FINDINGS: Areas of scarring in the right upper lobe. Heart is normal size. No effusions. Left lung clear. No acute bony  abnormality. IMPRESSION: Right upper lobe scarring.  No active disease. Electronically Signed   By: Rolm Baptise M.D.   On: 03/07/2020 03:38   DG CHEST PORT 1 VIEW  Result Date: 03/06/2020 CLINICAL DATA:  Shortness of breath. History of asthma/COPD. EXAM: PORTABLE CHEST 1 VIEW COMPARISON:  Radiograph 10/27/2019 FINDINGS: Patient is rotated. Heart is normal in size. Unchanged mediastinal contours allowing for differences in technique. Paucity of lung markings suggesting emphysema. Ill-defined patchy opacity in the suprahilar lungs, right greater than left. Linear opacity in the right midlung zone may represent atelectasis or fluid in the fissure. No pneumothorax or large pleural effusion. No acute osseous abnormalities are seen. IMPRESSION: Ill-defined patchy opacity in the suprahilar lungs, right greater than left. This may represent pneumonia, atelectasis/partial lobar collapse, or less likely cephalization of pulmonary vasculature. Recommend follow-up after course of treatment to document resolution. Paucity of lung markings suggesting emphysema. Electronically Signed   By: Keith Rake M.D.   On: 03/06/2020 10:12   ECHOCARDIOGRAM COMPLETE  Result Date: 03/09/2020    ECHOCARDIOGRAM REPORT   Patient Name:   Gurney Maxin Date of  Exam: 03/09/2020 Medical Rec #:  [id number]        Height:       62.0 in Accession #:    [id number]       Weight:       156.1 lb Date of Birth:  05/31/1951        BSA:          1.721 m Patient Age:    64 years         BP:           107/53 mmHg Patient Gender: F                HR:           92 bpm. Exam Location:  Inpatient Procedure: 2D Echo Indications:    518.82 acute respiratory insufficiency  History:        Patient has no prior history of Echocardiogram examinations.                 COPD.  Sonographer:    Jannett Celestine RDCS (AE) Referring Phys: [id number] [facility]  Sonographer Comments: Suboptimal apical window, no parasternal window and echo performed with patient  supine and on artificial respirator. Image acquisition challenging due to respiratory motion and Image acquisition challenging due to breast implants. IMPRESSIONS  1. Extremely difficult study and many windows are non-diagnostic. Overall, LV function is normal. The RV is severely dilated with moderately reduced function. Findings could be related to pulmonary embolism or pulmonary hypertension. Clinical correlation is recommended.  2. Left ventricular ejection fraction, by estimation, is 60 to 65%. The left ventricle has normal function. The left ventricle has no regional wall motion abnormalities. Left ventricular diastolic function could not be evaluated. There is the interventricular septum is flattened in systole, consistent with right ventricular pressure overload.  3. Right ventricular systolic function is moderately reduced. The right ventricular size is severely enlarged. Tricuspid regurgitation signal is inadequate for assessing PA pressure.  4. Right atrial size was mild to moderately dilated.  5. The mitral valve is grossly normal. No evidence of mitral valve regurgitation. No evidence of mitral stenosis.  6. The aortic valve is grossly normal. Aortic valve regurgitation is not visualized. No aortic stenosis is present.  7. The inferior vena cava is normal in size with greater than 50% respiratory variability, suggesting right atrial pressure of 3 mmHg. FINDINGS  Left Ventricle: Left ventricular ejection fraction, by estimation, is 60 to 65%. The left ventricle has normal function. The left ventricle has no regional wall motion abnormalities. The left ventricular internal cavity size was small. There is no left ventricular hypertrophy. The interventricular septum is flattened in systole, consistent with right ventricular pressure overload. Left ventricular diastolic function could not be evaluated. Right Ventricle: The right ventricular size is severely enlarged. No increase in right ventricular wall  thickness. Right ventricular systolic function is moderately reduced. Tricuspid regurgitation signal is inadequate for assessing PA pressure. Left Atrium: Left atrial size was normal in size. Right Atrium: Right atrial size was mild to moderately dilated. Pericardium: Trivial pericardial effusion is present. Presence of pericardial fat pad. Mitral Valve: The mitral valve is grossly normal. No evidence of mitral valve regurgitation. No evidence of mitral valve stenosis. Tricuspid Valve: The tricuspid valve is grossly normal. Tricuspid valve regurgitation is trivial. No evidence of tricuspid stenosis. Aortic Valve: The aortic valve is grossly normal. Aortic valve regurgitation is not visualized. No aortic stenosis is present. Pulmonic Valve: The pulmonic valve was  not well visualized. Pulmonic valve regurgitation is not visualized. Aorta: The aortic root is normal in size and structure. Venous: The inferior vena cava is normal in size with greater than 50% respiratory variability, suggesting right atrial pressure of 3 mmHg. IAS/Shunts: The atrial septum is grossly normal.  RIGHT VENTRICLE TAPSE (M-mode): 1.4 cm RIGHT ATRIUM           Index RA Area:     11.90 cm RA Volume:   27.00 ml  15.69 ml/m  AORTIC VALVE LVOT Vmax:   84.20 cm/s LVOT Vmean:  54.200 cm/s LVOT VTI:    0.135 m MITRAL VALVE MV Area (PHT): 4.06 cm    SHUNTS MV Decel Time: 187 msec    Systemic VTI: 0.14 m MV E velocity: 68.40 cm/s Eleonore Chiquito MD Electronically signed by Eleonore Chiquito MD Signature Date/Time: 03/09/2020/4:34:06 PM    Final    VAS [name] LOWER EXTREMITY VENOUS (DVT)  Result Date: 03/09/2020  Lower Venous DVTStudy Indications: Edema.  Limitations: Body habitus, poor ultrasound/tissue interface, line, and intubated. Comparison Study: No prior study Performing Technologist: Maudry Mayhew MHA, RDMS, RVT, RDCS  Examination Guidelines: A complete evaluation includes B-mode imaging, spectral Doppler, color Doppler, and power Doppler as  needed of all accessible portions of each vessel. Bilateral testing is considered an integral part of a complete examination. Limited examinations for reoccurring indications may be performed as noted. The reflux portion of the exam is performed with the patient in reverse Trendelenburg.  +---------+---------------+---------+-----------+----------+--------------+ RIGHT    CompressibilityPhasicitySpontaneityPropertiesThrombus Aging +---------+---------------+---------+-----------+----------+--------------+ FV Prox  Full                    Yes                                 +---------+---------------+---------+-----------+----------+--------------+ FV Mid   Full                    Yes                                 +---------+---------------+---------+-----------+----------+--------------+ FV DistalFull                    Yes                                 +---------+---------------+---------+-----------+----------+--------------+ POP      Full           Yes      Yes                                 +---------+---------------+---------+-----------+----------+--------------+ PTV      Full                                                        +---------+---------------+---------+-----------+----------+--------------+   Right Technical Findings: Not visualized segments include CFV, SFJ, PFV, peroneal veins.  +---------+---------------+---------+-----------+----------+--------------+ LEFT     CompressibilityPhasicitySpontaneityPropertiesThrombus Aging +---------+---------------+---------+-----------+----------+--------------+ CFV      Full           Yes  Yes                                 +---------+---------------+---------+-----------+----------+--------------+ SFJ      Full                                                        +---------+---------------+---------+-----------+----------+--------------+ FV Prox  Full                                                         +---------+---------------+---------+-----------+----------+--------------+ FV Mid   Full                                                        +---------+---------------+---------+-----------+----------+--------------+ FV DistalFull                                                        +---------+---------------+---------+-----------+----------+--------------+ POP      Full           Yes      Yes                                 +---------+---------------+---------+-----------+----------+--------------+ PTV      Full                                                        +---------+---------------+---------+-----------+----------+--------------+   Left Technical Findings: Not visualized segments include PFV, peroneal veins.   Summary: RIGHT: - There is no evidence of deep vein thrombosis in the lower extremity. However, portions of this examination were limited- see technologist comments above.  - No cystic structure found in the popliteal fossa.  LEFT: - There is no evidence of deep vein thrombosis in the lower extremity. However, portions of this examination were limited- see technologist comments above.  - No cystic structure found in the popliteal fossa.  *See table(s) above for measurements and observations. Electronically signed by Deitra Mayo MD on 03/09/2020 at 2:35:31 PM.    Final    [name] EKG SITE RITE  Result Date: 03/19/2020 If Site Rite image not attached, placement could not be confirmed due to current cardiac rhythm.  [name] EKG SITE RITE  Result Date: 03/09/2020 If Site Rite image not attached, placement could not be confirmed due to current cardiac rhythm.   Labs:  CBC: Recent Labs    03/14/20 0544 03/15/20 1046 03/18/20 0441 03/19/20 0415  WBC 24.6* 17.7* 21.6* 18.4*  HGB 9.3* 9.7* 9.3* 8.8*  HCT 28.1* 29.0* 29.6* 28.9*  PLT 233 316 481* 450*  COAGS: Recent Labs    03/09/20 1752  INR 1.3*    BMP: Recent  Labs    03/18/20 0441 03/19/20 0415 03/19/20 1147 03/20/20 0501  NA 150* 150* 150* 137  K 4.7 5.2* 4.7 3.9  CL 110 111 113* 101  CO2 32 31 30 27   GLUCOSE 168* 173* 129* 442*  BUN 93* 86* 83* 62*  CALCIUM 9.3 9.3 9.4 8.1*  CREATININE 0.89 0.75 0.71 0.71  GFRNONAA >60 >60 >60 >60  GFRAA >60 >60 >60 >60    LIVER FUNCTION TESTS: Recent Labs    03/16/20 0355 03/18/20 0441 03/19/20 0415 03/20/20 0501  BILITOT 0.8 0.6 0.8 0.8  AST 20 24 23  38  ALT 22 21 22  40  ALKPHOS 60 82 78 87  PROT 5.0* 5.7* 5.3* 4.9*  ALBUMIN 2.8* 3.3* 2.9* 2.7*    TUMOR MARKERS: No results for input(s): AFPTM, CEA, CA199, CHROMGRNA in the last 8760 hours.  Assessment and Plan: 69 y.o. female with past medical history of asthma/COPD and newly diagnosed proximal rectal cancer with sigmoid perforation, status post emergent low anterior resection/colostomy on 03/03/2020 followed by exploratory lap and colon resection with transverse colostomy on 3/31.  She has a right lower quadrant surgical drain in place.  Postop she developed septic shock with pneumonia, acute kidney injury, ileus and acute hypoxemic respiratory failure requiring intubation. She is scheduled for trach placement later today.  Follow-up CT abdomen pelvis performed yesterday revealed: 1. 5.5 x 3.2 cm fluid collection is noted along the greater curvature of the proximal stomach. 2. Surgical drain is again noted in the pelvis with tip in left lower quadrant. 3. Interval development of crescent-shaped fluid collection measuring 16.4 x 3.3 cm in the epigastric region and left upper quadrant of the abdomen which may extend into the left lower quadrant. Potentially this may represent abscess or developing abscess. Multiple other smaller fluid collections are noted which may represent small abscesses. 4. 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 5. Mild amount of free fluid is noted in the posterior pelvis. 6. Moderate anasarca is  noted.  She is currently afebrile, WBC 18.4, hemoglobin 8.8, plts 450k, creatinine normal. COVID 19 neg. Request now received from surgery for image guided drainage of left abdominal fluid collection.  Latest imaging studies were reviewed by Dr. Annamaria Boots.Risks and benefits discussed with the patient's spouse Tracy [name] including bleeding, infection, damage to adjacent structures, bowel perforation/fistula connection, and sepsis.  All of the patient's questions were answered, patient is agreeable to proceed. Consent signed and in chart.  Procedure tentatively scheduled for later today.   Thank you for this interesting consult.  I greatly enjoyed meeting Tracy [name] and look forward to participating in their care.  A copy of this report was sent to the requesting provider on this date.  Electronically Signed: D. Rowe Robert, PA-C 03/20/2020, 9:58 AM   I spent a total of 25 minutes  in face to face in clinical consultation, greater than 50% of which was counseling/coordinating care for CT-guided drainage of left abdominal fluid collection

## 2020-03-20 NOTE — Progress Notes (Signed)
PHARMACY - TOTAL PARENTERAL NUTRITION CONSULT NOTE   Indication: Prolonged ileus  Patient Measurements: Height: 5\' 2"  (157.5 cm) Weight: 70.3 kg (154 lb 15.7 oz) IBW/kg (Calculated) : 50.1   Body mass index is 28.35 kg/m.   Assessment:  68 yoF admitted on 3/21 bowel perforation s/p surgery on 3/21 and 3/31.  She remains intubated, on vasopressors, on antibiotic and antifungal coverage.  Tube feeds were given 3/27-3/29, then held.  OGT is still to suction, has bilious drainage.   Pharmacy is consulted to dose TPN.    Glucose / Insulin: A1c 5.9 - mSSI q4h (used 6 units from SSI in the past 24 hrs) - on solumedrol 40 mg daily--> reduced to 20 mg daily on 4/5 - CBGs (goal <150): all within goal execept one reading of 442 at 0500 on 4/7 (? Accuracy)  Electrolytes: Na down 137, K down 3.9, phos 3.1, mag down 1.6, CorrCa 9.14 - Cl 101, CO2 27 Renal: SCr wnl, BUN 62 LFTs / TGs: LFTs WNL / TG  Up 148 Prealbumin / albumin: 13.8 / 3.3 Intake / Output; MIVF: +3202 ml - D5W at 100 ml/hr Medications:  MVI, PPI -  Propofol started early 4/4 --> off at 0800 on 4/5 GI Imaging: Surgeries / Procedures:  3/21 emergent Low anterior resection/ colostomy  3/31 exploratory laparotomy and colon resection with transverse colostomy 4/3: trial of clamping OGT today per CCS note 4/6: TPN d/ced d/t high K  Central access: CVC triple lumen (placed 3/27) TPN start date: 4/1  Nutritional Goals (per RD recommendation on 4/1): Kcal:  1564, Protein:  127-140, Fluid:  >/= 1.5 L/day Goal TPN rate is 80 mL/hr (see below for new totals while pt on propofol) - While lipids are held for the first 7 days, goal will be to meet 100% of the patient's protein needs and approximately 80% of their caloric needs. - Glucose infusion rate will be at 2.1 mg/kg/min with new  (Maximum 5 mg/kg/min)  Current Nutrition:  NPO  Plan:  Now:  - Magnesium sulfate 2gm IV x1 - potassium chloride 33meq x2 runs  - resume TPN at  goal 80 ml/hr at 1800 - Hold lipid emulsion for first 7 days for critically ill patients per ASPEN guidelines (Start date 4/8). - Electrolytes in TPN: 40 mEq/L of Na, 30 mEq/L of K, 65mEq/L of Ca, 45mEq/L of Mg, continue 15 mmol/L of Phos. Cl:Ac ratio 1:1 - Add standard MVI to TPN on MWF only  - Add standard trace elements to TPN daily - Continue SSI q4h moderate scale - Monitor TPN labs on Mon/Thurs and as needed - d/c IVF D5W now that Na is wnl   Dia Sitter, PharmD, BCPS 03/20/2020 7:56 AM

## 2020-03-20 NOTE — Op Note (Signed)
[facility] PCCM Op Note : Tracheostomy  Indication: Acute respiratory failure with hypoxemia  Location: [facility]  Primary Surgeon: Dr. Roselie Awkward Assistant: Dr. Leory Plowman Icard  Condition: Critically ill  The risks and benefits of the procedure were described to the patient's husband who gave consent.  Procedure description: A timeout was performed confirming the patient's name, date of birth, and indication for the procedure.  The patient's skin was prepped with chlorhexidine, 1% plain lidocaine was used for topical anesthesia.  A 3 cm vertical incision was made above the sternal notch.  Blunt dissection was used until the tracheal rings could be easily palpated.  A needle and catheter passed into the trachea under video visualization with the bronchoscope.  The wire was passed into the trachea without evidence of posterior wall perforation.  The trachea was then dilated progressively.  A #6 Shiley tracheostomy was passed into the airway with video visualization via the bronchoscopy. Two sutures were placed to secure the tracheostomy.   Patient tolerance: Good  Bleeding: Minimal  Patient condition at the completion of the procedure: Critically ill, stable on vent  Roselie Awkward, MD [place] Pager: [phone] Cell: [phone] If no response, call [phone]

## 2020-03-20 NOTE — Progress Notes (Signed)
[facility] Progress Note Patient Name: KIERSTI GRANDPRE DOB: 03/01/1951 MRN: [id number]   Date of Service  03/20/2020  HPI/Events of Note  Hypertension - Fentanyl and Precedex IV infusion held d/t hypotension. BP = 138/61 by A-line now.   eICU Interventions  Will order: 1. Increase ceiling on Fentanyl IV infusion to 300 mg/hour. 2. Titrate Fentanyl to sedation/pain.      Intervention Category Major Interventions: Hypertension - evaluation and management  Tehilla Coffel [place] 03/20/2020, 6:19 AM

## 2020-03-20 NOTE — Progress Notes (Signed)
NAME:  Tracy [name], MRN:  [id number], DOB:  1951-04-22, LOS: 17 ADMISSION DATE:  03/03/2020, CONSULTATION DATE:  3/27  REFERRING MD:  Ninfa Linden, CHIEF COMPLAINT:  Abdominal pain   Brief History   69 y/o female admitted 3/21 with bowel perforation requiring colostomy and lower anterior resection in setting of newly diagnosed proximal rectal cancer with sigmoid perforation.  Post op developed septic shock, HCAP AKI ileus and acute hypoxemic respiratory failure requiring intubation.   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 Valeta Harms. Not on O2.  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, metastatic carcinoma in 3/15 lymph nodes  LE [name] 3/27 >> negative for DVT  ECHO 3/27 >> poor windows, normal LV function, severely dilated RV with moderately reduced function, RV volume and pressure overload, mildly dilated RA  V/Q Scan 3/30 >> low probability for PE  4/6 CT chest abdomen pelvis> 5.5x3.2 fluid collection along stomach, surgical drain again noted in pelvis, interval development of crescent shaped fluid collection measuring 16.4 x 3.3 in epigastric region and left upper  quadrant of abdomen (could be abscess) multiple other smaller fluid collections, colostomy noted, mild free fluid in posterior pelvis  Micro Data:  COVID 3/21 >> Negative MRSA PCR 3/21 >> Negative Blood cultures 3/27 >> negative  Urine culture 3/26 >> insignificant growth  Tracheal Aspirate 3/29 >> Saccharomyces cerevisiae Trach aspirate 4/2>>  Antimicrobials:  Zosyn 3/21 >> 3/29 Anidulafungin 3/31 >> 4/5 Meropenem 3/29 >>   Interim history/subjective:   glucose 442 overnight, but otherwise well controlled Cuff and arterial line pressures don't correlate  Objective   Blood pressure (!) 222/89, pulse 89, temperature 97.9 F (36.6 C), temperature source Axillary, resp. rate (!) 27, height 5\' 2"  (1.575 m), weight 70.3 kg, last menstrual period 12/14/2000, SpO2 97 %.    Vent Mode: PRVC FiO2 (%):  [30 %-40 %] 30 % Set Rate:  [10 bmp] 10 bmp Vt Set:  [400 mL] 400 mL PEEP:  [5 cmH20] 5 cmH20 Plateau Pressure:  [16 cmH20-22 cmH20] 17 cmH20   Intake/Output Summary (Last 24 hours) at 03/20/2020 0718 Last data filed at 03/20/2020 0524 Gross per 24 hour  Intake 4255.74 ml  Output 1876 ml  Net 2379.74 ml   Filed Weights   03/18/20 0353 03/19/20 0423 03/20/20 0500  Weight: 69.3 kg 70.4 kg 70.3 kg    Examination:  General:  In bed on vent HENT: NCAT ETT in place PULM: CTA B, vent supported breathing CV: RRR, no mgr [id number], ostomy in place MSK: normal bulk and tone Neuro: sedated on vent    4/3 CXR images personally reviewed> emphysema bilaterally, ETT in place  Resolved Hospital Problem list  Assessment & Plan:  Acute hypoxemic respiratory failure> pressure support trial limited by anxiety/pain and severe lung disease, unclear if will be able to be liberated from ventialtor.  Endotracheal tube contributing to anxiety. Baseline GOLD III/IV COPD;  Severe emphysema  Plan tracheostomy tube today Full mechanical vent support VAP prevention Daily WUA/SBT Pressure  support weaning in AM Stop solumedrol  Bowel perforation/partial colectomy Colon Cancer New fluid collection on 4/6 CT abdomen Malnutrition, moderate Per general surgery Consider drain new fluid collection Consider enteral feeding this week after drains  Anasarca, neg negative on 4/4, exam likely improved Hypernatremia > improved AKI > resolved Stop D5 Monitor BMET and UOP Replace electrolytes as needed  Hypertension Monitor hemodynamics  Acute anemia, stable 4/4 without bleeding Monitor for bleeding Transfuse PRBC for Hgb < 7 gm/dL  Hyperglycemia SSI + Glargine  Need for sedation for mechanical ventilation RASS goal 0 to -1 PAD protocol: continue fentanyl/precedex, prn versed   Best practice:  Diet: TPN Pain/Anxiety/Delirium protocol (if indicated): yes, as above VAP protocol (if indicated): yes DVT prophylaxis: sub q heparin > hold for trach 4/7 GI prophylaxis: PPI Glucose control: as above Mobility: bed rest Code Status: full Family Communication: Danny updated bedside again Disposition: remain in ICU  Labs   CBC: Recent Labs  Lab 03/13/20 1141 03/14/20 0544 03/15/20 1046 03/18/20 0441 03/19/20 0415  WBC 26.7* 24.6* 17.7* 21.6* 18.4*  NEUTROABS  --  23.4* 16.3* 19.6*  --   HGB 10.1* 9.3* 9.7* 9.3* 8.8*  HCT 30.8* 28.1* 29.0* 29.6* 28.9*  MCV 93.6 94.6 93.2 96.7 99.0  PLT 240 233 316 481* 450*    Basic Metabolic Panel: Recent Labs  Lab 03/16/20 0355 03/16/20 0355 03/17/20 0226 03/18/20 0441 03/19/20 0415 03/19/20 1147 03/20/20 0501  NA 148*   < > 150* 150* 150* 150* 137  K 3.8   < > 4.5 4.7 5.2* 4.7 3.9  CL 116*   < > 112* 110 111 113* 101  CO2 24   < > 27 32 31 30 27   GLUCOSE 279*   < > 186* 168* 173* 129* 442*  BUN 68*   < > 79* 93* 86* 83* 62*  CREATININE 1.08*   < > 1.03* 0.89 0.75 0.71 0.71  CALCIUM 8.4*   < > 9.5 9.3 9.3 9.4 8.1*  MG 2.1  --  2.0 2.0 2.0  --  1.6*  PHOS 2.6  --  2.4* 3.2 3.0  --  3.1   < > = values in this  interval not displayed.   GFR: Estimated Creatinine Clearance: 63.6 mL/min (by C-G formula based on SCr of 0.71 mg/dL). Recent Labs  Lab 03/14/20 0544 03/15/20 1046 03/18/20 0441 03/19/20 0415  WBC 24.6* 17.7* 21.6* 18.4*    Liver Function Tests: Recent Labs  Lab 03/14/20 0544 03/16/20 0355 03/18/20 0441 03/19/20 0415 03/20/20 0501  AST 27 20 24 23  38  ALT 27 22 21 22  40  ALKPHOS 54 60 82 78 87  BILITOT 1.1 0.8 0.6 0.8 0.8  PROT 5.3* 5.0* 5.7* 5.3* 4.9*  ALBUMIN 3.5 2.8* 3.3* 2.9* 2.7*   No results for input(s): LIPASE, AMYLASE in the last 168 hours. No results for input(s): AMMONIA in the last 168 hours.  ABG    Component Value Date/Time   PHART 7.398 03/16/2020 2245   PCO2ART 45.6 03/16/2020 2245   PO2ART 70.3 (L) 03/16/2020 2245   HCO3 27.7 03/16/2020 2245   TCO2 24 03/12/2020 1513   ACIDBASEDEF 4.8 (H)  03/12/2020 1738   O2SAT 93.5 03/16/2020 2245     Coagulation Profile: No results for input(s): INR, PROTIME in the last 168 hours.  Cardiac Enzymes: No results for input(s): CKTOTAL, CKMB, CKMBINDEX, TROPONINI in the last 168 hours.  HbA1C: Hgb A1c MFr Bld  Date/Time Value Ref Range Status  03/11/2020 05:00 AM 5.9 (H) 4.8 - 5.6 % Final    Comment:    (NOTE) Pre diabetes:          5.7%-6.4% Diabetes:              >6.4% Glycemic control for   <7.0% adults with diabetes     CBG: Recent Labs  Lab 03/19/20 1159 03/19/20 1724 03/19/20 1928 03/19/20 2309 03/20/20 0312  GLUCAP 131* 137* 121* 120* 119*     Critical care time: 35 minutes     Roselie Awkward, MD [place] PCCM Pager: [phone] Cell: [phone] If no response, call [phone]

## 2020-03-20 NOTE — Progress Notes (Addendum)
8 Days Post-Op    IO:[id number] pain  Subjective: She has been agitated with elevated blood pressures.  She has 2 blood pressures; cuff pressure significantly lower than the A-line.  She put 700 out thru   her ostomy yesterday, most of it on the first shift.  Not much since last PM. Surgical drain is serous.  Objective: Vital signs in last 24 hours: Temp:  [97.8 F (36.6 C)-99.1 F (37.3 C)] 97.8 F (36.6 C) (04/07 0741) Pulse Rate:  [77-104] 89 (04/07 0500) Resp:  [17-34] 27 (04/07 0500) BP: (55-250)/(38-113) 250/97 (04/07 0741) SpO2:  [95 %-100 %] 97 % (04/07 0734) Arterial Line BP: (73-240)/(48-116) 220/95 (04/07 0521) FiO2 (%):  [30 %-40 %] 30 % (04/07 0734) Weight:  [70.3 kg] 70.3 kg (04/07 0500) Last BM Date: 03/19/20 NPO 4255 IV 1150 urine Drain 20 Stool 706 Afebrile,  BP down and up off pressors Glucose 442.  Mag 1.6 CT 4/6: 5.9 x 3.2 cm fluid collection along Rupert curvature of the stomach.  Interval development crescent-shaped fluid collection 16 x 4 x 3.3 cm epigastric region right upper quadrant of the abdomen which extends left lower quadrant.  Potentially this may represent abscess or developing abscess.  Multiple other smaller fluid collections  Intake/Output from previous day: 04/06 0701 - 04/07 0700 In: 4255.7 [I.V.:4149.1; IV Piggyback:106.6] Out: 1876 [Urine:1150; Drains:20; EQAST:419] Intake/Output this shift: No intake/output data recorded.  General appearance: Sedated on the vent remains anxious. Resp: Clear anterior exam. Cardio: no click and Tachycardic GI: Open abdominal wall with some necrosis at the base.  Some output through the ostomy yesterday.  Ostomy is dark pink/viable.  Surgical drain is serous fluid.  Lab Results:  Recent Labs    03/18/20 0441 03/19/20 0415  WBC 21.6* 18.4*  HGB 9.3* 8.8*  HCT 29.6* 28.9*  PLT 481* 450*    BMET Recent Labs    03/19/20 1147 03/20/20 0501  NA 150* 137  K 4.7 3.9  CL 113* 101  CO2 30 27   GLUCOSE 129* 442*  BUN 83* 62*  CREATININE 0.71 0.71  CALCIUM 9.4 8.1*   PT/INR No results for input(s): LABPROT, INR in the last 72 hours.  Recent Labs  Lab 03/14/20 0544 03/16/20 0355 03/18/20 0441 03/19/20 0415 03/20/20 0501  AST 27 20 24 23  38  ALT 27 22 21 22  40  ALKPHOS 54 60 82 78 87  BILITOT 1.1 0.8 0.6 0.8 0.8  PROT 5.3* 5.0* 5.7* 5.3* 4.9*  ALBUMIN 3.5 2.8* 3.3* 2.9* 2.7*     Lipase     Component Value Date/Time   LIPASE 18 03/03/2020 1214     Medications: . arformoterol  15 mcg Nebulization BID  . budesonide (PULMICORT) nebulizer solution  0.5 mg Nebulization BID  . chlorhexidine gluconate (MEDLINE KIT)  15 mL Mouth Rinse BID  . Chlorhexidine Gluconate Cloth  6 each Topical Daily  . etomidate  40 mg Intravenous Once  . fentaNYL (SUBLIMAZE) injection  200 mcg Intravenous Once  . heparin injection (subcutaneous)  5,000 Units Subcutaneous Q8H  . insulin aspart  0-15 Units Subcutaneous Q4H  . ipratropium-albuterol  3 mL Nebulization Q4H  . mouth rinse  15 mL Mouth Rinse 10 times per day  . methylPREDNISolone (SOLU-MEDROL) injection  20 mg Intravenous Daily  . midazolam  5 mg Intravenous Once  . pantoprazole (PROTONIX) IV  40 mg Intravenous Daily  . propofol  500 mg Intravenous Once  . sodium chloride flush  10-40 mL Intracatheter Q12H  .  sodium chloride flush  10-40 mL Intracatheter Q12H  . sodium chloride flush  3 mL Intravenous Q12H  . vecuronium  10 mg Intravenous Once   . sodium chloride 10 mL/hr at 03/19/20 1737  . dexmedetomidine (PRECEDEX) IV infusion 0.8 mcg/kg/hr (03/20/20 0524)  . dextrose 100 mL/hr at 03/19/20 2045  . fentaNYL infusion INTRAVENOUS 125 mcg/hr (03/20/20 0823)  . meropenem (MERREM) IV Stopped (03/20/20 0059)   Assessment/Plan Septic shock  COPD -Gold stage III-IV (FEV1 31%) Acute hypoxic respiratory failure/tentative plans for tracheostomy 03/20/20 AKI - Cr normalized ABL anemia -Hgb 8.8, stable Malnutrition - on  TPN  Perforated sigmoid colon Proximal rectal cancer with obstruction S/p emergent Low anterior resection/ colostomy by Dr. Marcello Moores 03/03/20 POD#17 S/p exploratory laparotomy and colon resection with transverse colostomy 3/31 Dr. Hassell Done POD#7 - BID wet to dry dressing changes to midline abdominal wound - WOC team following for ostomy teaching  - JP drain output serous - Ostomy viable but no significant bowel function. WBC downtrending but remains elevated, will discuss with MD but likely plan for repeat CT a/p today.   FEN -NPO/OG to LIWS, TPN. If extubated ok for clear liquids VTE - SCDs,SQ heparin ID - Zosyn3/21>3/29; Eraxis 3/31>>4/5, Merrem 3/29>> Foley -in place  Plan: Tracheostomy is scheduled for later today.  Will review CT findings with Dr. Barry Dienes and discuss with IR drain evaluation.  Appreciate Dr. McQuaid's/CCM's assistance.     LOS: 17 days    Tracy [name] 03/20/2020 Please see Amion

## 2020-03-20 NOTE — Procedures (Signed)
Interventional Radiology Procedure Note  Procedure: CT left abd drains x 2  Complications: None  Estimated Blood Loss: min  Findings: Exudative fld from both  cxs sent

## 2020-03-21 ENCOUNTER — Inpatient Hospital Stay (HOSPITAL_COMMUNITY): Payer: Medicare HMO

## 2020-03-21 LAB — COMPREHENSIVE METABOLIC PANEL
ALT: 32 U/L (ref 0–44)
AST: 31 U/L (ref 15–41)
Albumin: 2.5 g/dL — ABNORMAL LOW (ref 3.5–5.0)
Alkaline Phosphatase: 79 U/L (ref 38–126)
Anion gap: 7 (ref 5–15)
BUN: 61 mg/dL — ABNORMAL HIGH (ref 8–23)
CO2: 27 mmol/L (ref 22–32)
Calcium: 8.3 mg/dL — ABNORMAL LOW (ref 8.9–10.3)
Chloride: 113 mmol/L — ABNORMAL HIGH (ref 98–111)
Creatinine, Ser: 0.6 mg/dL (ref 0.44–1.00)
GFR calc Af Amer: >60 mL/min (ref >60)
GFR calc non Af Amer: >60 mL/min (ref >60)
Glucose, Bld: 147 mg/dL — ABNORMAL HIGH (ref 70–99)
Potassium: 4.6 mmol/L (ref 3.5–5.1)
Sodium: 147 mmol/L — ABNORMAL HIGH (ref 135–145)
Total Bilirubin: 1.2 mg/dL (ref 0.3–1.2)
Total Protein: 4.8 g/dL — ABNORMAL LOW (ref 6.5–8.1)

## 2020-03-21 LAB — GLUCOSE, CAPILLARY
Glucose-Capillary: 127 mg/dL — ABNORMAL HIGH (ref 70–99)
Glucose-Capillary: 137 mg/dL — ABNORMAL HIGH (ref 70–99)
Glucose-Capillary: 143 mg/dL — ABNORMAL HIGH (ref 70–99)
Glucose-Capillary: 150 mg/dL — ABNORMAL HIGH (ref 70–99)
Glucose-Capillary: 166 mg/dL — ABNORMAL HIGH (ref 70–99)
Glucose-Capillary: 203 mg/dL — ABNORMAL HIGH (ref 70–99)

## 2020-03-21 LAB — CBC
HCT: 30 % — ABNORMAL LOW (ref 36.0–46.0)
Hemoglobin: 9.2 g/dL — ABNORMAL LOW (ref 12.0–15.0)
MCH: 31.1 pg (ref 26.0–34.0)
MCHC: 30.7 g/dL (ref 30.0–36.0)
MCV: 101.4 fL — ABNORMAL HIGH (ref 80.0–100.0)
Platelets: 390 K/uL (ref 150–400)
RBC: 2.96 MIL/uL — ABNORMAL LOW (ref 3.87–5.11)
RDW: 16.4 % — ABNORMAL HIGH (ref 11.5–15.5)
WBC: 17.7 K/uL — ABNORMAL HIGH (ref 4.0–10.5)
nRBC: 0 % (ref 0.0–0.2)

## 2020-03-21 LAB — PHOSPHORUS: Phosphorus: 3.1 mg/dL (ref 2.5–4.6)

## 2020-03-21 LAB — MAGNESIUM: Magnesium: 2.3 mg/dL (ref 1.7–2.4)

## 2020-03-21 MED ORDER — TRAVASOL 10 % IV SOLN
INTRAVENOUS | Status: AC
  Filled 2020-03-21: qty 1248

## 2020-03-21 MED ORDER — HYDROMORPHONE HCL 1 MG/ML IJ SOLN
0.5000 mg | INTRAMUSCULAR | Status: DC | PRN
  Administered 2020-03-21 – 2020-03-22 (×3): 1 mg via INTRAVENOUS
  Administered 2020-03-22 – 2020-03-23 (×4): 0.5 mg via INTRAVENOUS
  Administered 2020-03-24 – 2020-03-26 (×5): 1 mg via INTRAVENOUS
  Administered 2020-03-26 (×2): 0.5 mg via INTRAVENOUS
  Administered 2020-03-26 (×2): 1 mg via INTRAVENOUS
  Administered 2020-03-26: 0.5 mg via INTRAVENOUS
  Administered 2020-03-27 – 2020-04-05 (×53): 1 mg via INTRAVENOUS
  Filled 2020-03-21 (×72): qty 1

## 2020-03-21 MED ORDER — FUROSEMIDE 10 MG/ML IJ SOLN
40.0000 mg | Freq: Four times a day (QID) | INTRAMUSCULAR | Status: AC
  Administered 2020-03-21 (×2): 40 mg via INTRAVENOUS
  Filled 2020-03-21 (×2): qty 4

## 2020-03-21 MED ORDER — DEXTROSE 5 % IV SOLN: INTRAVENOUS | Status: AC

## 2020-03-21 MED ORDER — FREE WATER
100.0000 mL | Status: DC
  Administered 2020-03-21 – 2020-03-22 (×7): 100 mL

## 2020-03-21 MED ORDER — VITAL HIGH PROTEIN PO LIQD
1000.0000 mL | ORAL | Status: DC
  Administered 2020-03-21 – 2020-03-22 (×2): 1000 mL

## 2020-03-21 MED ORDER — ONDANSETRON HCL 4 MG/2ML IJ SOLN
4.0000 mg | Freq: Four times a day (QID) | INTRAMUSCULAR | Status: DC | PRN
  Administered 2020-03-21: 4 mg via INTRAVENOUS
  Filled 2020-03-21: qty 2

## 2020-03-21 NOTE — Progress Notes (Signed)
Pharmacy Antibiotic Note  Tracy [name] is a 69 y.o. female with newly dx rectal cancer and  perforated sigmoid colon (s/p anterior resection/colostomy on 3/21 and colon resection with transverse colostomy on 3/31) currently on meropenem and Eraxis for intra-abdominal infection. Eraxis d/c'd on 4/5  Today, 03/21/2020: - day #17 total of abx; day #11 meropenem - afeb, wbc down 18.4 (on steroid) - scr down 0.89 (crcl~57) - pt remains intubated  Plan: - Continue meropenem 1g IV q8 -  What is plan for LOT of meropenem at this point?  ________________________________  Height: 5\' 2"  (157.5 cm) Weight: 71.1 kg (156 lb 12 oz) IBW/kg (Calculated) : 50.1  Temp (24hrs), Avg:98.5 F (36.9 C), Min:97.9 F (36.6 C), Max:99.2 F (37.3 C)  Recent Labs  Lab 03/15/20 1046 03/16/20 0355 03/18/20 0441 03/19/20 0415 03/19/20 1147 03/20/20 0501 03/21/20 0327  WBC 17.7*  --  21.6* 18.4*  --   --  17.7*  CREATININE  --    < > 0.89 0.75 0.71 0.71 0.60   < > = values in this interval not displayed.    Estimated Creatinine Clearance: 63.9 mL/min (by C-G formula based on SCr of 0.6 mg/dL).    Allergies  Allergen Reactions  . Codeine Swelling    Antimicrobials this admission:  3/21 Cefotetan x 1 3/22 Zosyn >> 3/29 3/29 Meropenem >>  3/31 Eraxis >> 4/5  Dose adjustments this admission:  3/23 ZEI > 2.25 q8 3/24 2.25 > ZEI 4/2 changed meropenem from 500mg  q12 to 1g q12 due to improved SCr 4/5: changed meropenem to 1gm q8h  Microbiology results:  3/21 Influenza: neg; Covid: neg 3/21 MRSA PCR neg 3/26 UCx: < 10k colonies 3/27 BC: neg FINAL 3/29 TA: few yeast, few saccharomyces cerevisiae FINAL 4/2 TA: few yeast  Thank you for allowing pharmacy to be a part of this patient's care.  Kara Mead 03/21/2020 11:01 AM

## 2020-03-21 NOTE — Progress Notes (Signed)
PHARMACY - TOTAL PARENTERAL NUTRITION CONSULT NOTE   Indication: Prolonged ileus  Patient Measurements: Height: 5\' 2"  (157.5 cm) Weight: 71.1 kg (156 lb 12 oz) IBW/kg (Calculated) : 50.1   Body mass index is 28.67 kg/m.   Assessment:  76 yoF admitted on 3/21 bowel perforation s/p surgery on 3/21 and 3/31.  She remains intubated, on vasopressors, on antibiotic and antifungal coverage.  Tube feeds were given 3/27-3/29, then held.  OGT is still to suction, has bilious drainage.   Pharmacy is consulted to dose TPN.    Glucose / Insulin: A1c 5.9 - mSSI q4h (used 8 units from SSI in the past 24 hrs) - on solumedrol 40 mg daily--> reduced to 20 mg daily on 4/5 - d/c'd 4/7 - CBGs (goal <150): all within goal range 95-150 - note that D5W currently at 50 ml/hr Electrolytes: Na up 147, K up 4.6, phos 3.1, Cl up 113 Renal: SCr wnl, BUN 61 LFTs / TGs: LFTs WNL / TG  Up 148 Prealbumin / albumin: 13.8 / 3.3 Intake / Output; MIVF: last 24hrs, - 533 mL net with 1.2 L output from drains. Total net I/O of +1.53L for admission - D5W at 50 ml/hr Medications:  MVI, PPI -  Propofol started early 4/4 --> off at 0800 on 4/5 GI Imaging: Surgeries / Procedures:  3/21 emergent Low anterior resection/ colostomy  3/31 exploratory laparotomy and colon resection with transverse colostomy 4/3: trial of clamping OGT today per CCS note 4/6: TPN d/ced d/t high K  Central access: CVC triple lumen (placed 3/27) TPN start date: 4/1  Nutritional Goals (per RD recommendation on 4/8): Kcal:  1420, Protein:  127-140 g, Fluid:  >/= 1.5 L/day Goal TPN rate is 80 mL/hr (124g protein, 1570kcal/day per new calculations 4/8) - While lipids are held for the first 7 days, goal will be to meet 100% of the patient's protein needs and approximately 80% of their caloric needs. - Glucose infusion rate will be at 2.1 mg/kg/min with new  (Maximum 5 mg/kg/min)  Current Nutrition:  TPN + on 4/8 to start TFs at trickle of 10 ml/hr  with no plans to advance for 1 to 2 days per CCS  Plan:  - Continue TPN at goal rate of 80 ml/hr - Due to fact that TFs will only be at 10 ml/hr starting today and not sure if patient will tolerate that yet, will go ahead and add lipids to TPN as planned since lipids have been on hold for 7 days of starting TPN per guidelines while patient is ICU status. - As a result of adding lipids to TPN this evening, will reduce protein from 70g/L to 65g/L to provide total 124 g protein, will reduce dextrose from 15% to 12% and add 15g/L of lipids to provide 1570 total kcal/day with this formula - Electrolytes in TPN: remove Na, 15 mEq/L of K, 32mEq/L of Ca, 59mEq/L of Mg, reduced phos of 10 mmol/L of Phos. Maximize acetate - Add standard MVI to TPN on MWF only  - Add standard trace elements to TPN daily - Continue SSI q4h moderate scale - Monitor TPN labs on Mon/Thurs and as needed    Adrian Saran, PharmD, BCPS 03/21/2020 10:52 AM

## 2020-03-21 NOTE — Progress Notes (Signed)
[facility] Progress Note  9 Days Post-Op  Subjective: CC-  Husband at bedside. S/p trach yesterday. IR also placed 2 abdominal drains yesterday, culture pending. 50cc stool out through ostomy last 24 hours.  Objective: Vital signs in last 24 hours: Temp:  [97.9 F (36.6 C)-99.2 F (37.3 C)] 98.6 F (37 C) (04/08 0338) Pulse Rate:  [87-119] 107 (04/08 0200) Resp:  [17-31] 31 (04/08 0200) BP: (82-140)/(48-72) 94/69 (04/08 0200) SpO2:  [95 %-100 %] 96 % (04/08 0200) Arterial Line BP: (77-230)/(42-93) 93/90 (04/08 0200) FiO2 (%):  [30 %] 30 % (04/08 0320) Weight:  [71.1 kg] 71.1 kg (04/08 0333) Last BM Date: 03/20/20(per ostomy)  Intake/Output from previous day: 04/07 0701 - 04/08 0700 In: 2241.6 [I.V.:1987.5; IV Piggyback:254.1] Out: 2775 [Urine:1520; Drains:1205; Stool:50] Intake/Output this shift: Total I/O In: 435.5 [I.V.:435.5] Out: 45 [Drains:45]  PE: Gen:  NAD HEENT: trach in place Cardio: 2+ DP pulses Pulm:  Mechanically ventilated Abd: soft, mild distension, open midline incision light pink with trace slough/ no significant drainage, ostomy viable with small amount of loose stool in bag. Surgical drain serous. IR drain x2 - 1 is serous the other cloudy/red Ext:  Calves soft and nontender without edema Skin: warm and dry  Lab Results:  Recent Labs    03/19/20 0415 03/21/20 0327  WBC 18.4* 17.7*  HGB 8.8* 9.2*  HCT 28.9* 30.0*  PLT 450* 390   BMET Recent Labs    03/20/20 0501 03/21/20 0327  NA 137 147*  K 3.9 4.6  CL 101 113*  CO2 27 27  GLUCOSE 442* 147*  BUN 62* 61*  CREATININE 0.71 0.60  CALCIUM 8.1* 8.3*   PT/INR No results for input(s): LABPROT, INR in the last 72 hours. CMP     Component Value Date/Time   NA 147 (H) 03/21/2020 0327   K 4.6 03/21/2020 0327   CL 113 (H) 03/21/2020 0327   CO2 27 03/21/2020 0327   GLUCOSE 147 (H) 03/21/2020 0327   BUN 61 (H) 03/21/2020 0327   CREATININE 0.60 03/21/2020 0327   CALCIUM  8.3 (L) 03/21/2020 0327   PROT 4.8 (L) 03/21/2020 0327   ALBUMIN 2.5 (L) 03/21/2020 0327   AST 31 03/21/2020 0327   ALT 32 03/21/2020 0327   ALKPHOS 79 03/21/2020 0327   BILITOT 1.2 03/21/2020 0327   GFRNONAA >60 03/21/2020 0327   GFRAA >60 03/21/2020 0327   Lipase     Component Value Date/Time   LIPASE 18 03/03/2020 1214       Studies/Results: CT CHEST W CONTRAST  Result Date: 03/19/2020 CLINICAL DATA:  Abdominal abscess or infection. EXAM: CT CHEST, ABDOMEN, AND PELVIS WITH CONTRAST TECHNIQUE: Multidetector CT imaging of the chest, abdomen and pelvis was performed following the standard protocol during bolus administration of intravenous contrast. CONTRAST:  132mL OMNIPAQUE IOHEXOL 300 MG/ML  SOLN COMPARISON:  March 12, 2020. FINDINGS: CT CHEST FINDINGS Cardiovascular: No significant vascular findings. Normal heart size. No pericardial effusion. Mediastinum/Nodes: Endotracheal tube is in grossly good position. Thyroid gland is unremarkable. No adenopathy is noted. Nasogastric tube is seen passing through esophagus into stomach. Lungs/Pleura: No pneumothorax or pleural effusion is noted. Minimal right posterior basilar subsegmental atelectasis is noted. Musculoskeletal: No chest wall mass or suspicious bone lesions identified. CT ABDOMEN PELVIS FINDINGS Hepatobiliary: No focal liver abnormality is seen. No gallstones, gallbladder wall thickening, or biliary dilatation. Pancreas: Unremarkable. No pancreatic ductal dilatation or surrounding inflammatory changes. Spleen: Normal in size without focal abnormality. Adrenals/Urinary Tract: Adrenal glands  are unremarkable. Kidneys are normal, without renal calculi, focal lesion, or hydronephrosis. Bladder is unremarkable. Stomach/Bowel: Nasogastric tube tip is seen in proximal stomach. 5.5 x 3.2 cm fluid collection is noted along the greater curvature of the proximal stomach. 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 No definite evidence of bowel  obstruction is noted. Vascular/Lymphatic: Aortic atherosclerosis. No enlarged abdominal or pelvic lymph nodes. Reproductive: Uterus and bilateral adnexa are unremarkable. Other: Mild amount of free fluid is noted in the posterior pelvis. Surgical drain is again noted in the pelvis with tip in the left lower quadrant. There is interval development of crescent-shaped fluid collection measuring 16.4 x 3.3 cm in the epigastric region and left upper quadrant of the abdomen which may extend into the left lower quadrant. Potentially this may represent abscess or developing abscess. Multiple other smaller fluid collections are noted which may represent small abscesses. Moderate anasarca is noted. Musculoskeletal: No acute or significant osseous findings. IMPRESSION: 1. 5.5 x 3.2 cm fluid collection is noted along the greater curvature of the proximal stomach. 2. Surgical drain is again noted in the pelvis with tip in left lower quadrant. 3. Interval development of crescent-shaped fluid collection measuring 16.4 x 3.3 cm in the epigastric region and left upper quadrant of the abdomen which may extend into the left lower quadrant. Potentially this may represent abscess or developing abscess. Multiple other smaller fluid collections are noted which may represent small abscesses. 4. 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 5. Mild amount of free fluid is noted in the posterior pelvis. 6. Moderate anasarca is noted. Aortic Atherosclerosis (ICD10-I70.0). Electronically Signed   By: Marijo Conception M.D.   On: 03/19/2020 17:36   CT ABDOMEN PELVIS W CONTRAST  Result Date: 03/19/2020 CLINICAL DATA:  Abdominal abscess or infection. EXAM: CT CHEST, ABDOMEN, AND PELVIS WITH CONTRAST TECHNIQUE: Multidetector CT imaging of the chest, abdomen and pelvis was performed following the standard protocol during bolus administration of intravenous contrast. CONTRAST:  143mL OMNIPAQUE IOHEXOL 300 MG/ML  SOLN COMPARISON:  March 12, 2020.  FINDINGS: CT CHEST FINDINGS Cardiovascular: No significant vascular findings. Normal heart size. No pericardial effusion. Mediastinum/Nodes: Endotracheal tube is in grossly good position. Thyroid gland is unremarkable. No adenopathy is noted. Nasogastric tube is seen passing through esophagus into stomach. Lungs/Pleura: No pneumothorax or pleural effusion is noted. Minimal right posterior basilar subsegmental atelectasis is noted. Musculoskeletal: No chest wall mass or suspicious bone lesions identified. CT ABDOMEN PELVIS FINDINGS Hepatobiliary: No focal liver abnormality is seen. No gallstones, gallbladder wall thickening, or biliary dilatation. Pancreas: Unremarkable. No pancreatic ductal dilatation or surrounding inflammatory changes. Spleen: Normal in size without focal abnormality. Adrenals/Urinary Tract: Adrenal glands are unremarkable. Kidneys are normal, without renal calculi, focal lesion, or hydronephrosis. Bladder is unremarkable. Stomach/Bowel: Nasogastric tube tip is seen in proximal stomach. 5.5 x 3.2 cm fluid collection is noted along the greater curvature of the proximal stomach. 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 No definite evidence of bowel obstruction is noted. Vascular/Lymphatic: Aortic atherosclerosis. No enlarged abdominal or pelvic lymph nodes. Reproductive: Uterus and bilateral adnexa are unremarkable. Other: Mild amount of free fluid is noted in the posterior pelvis. Surgical drain is again noted in the pelvis with tip in the left lower quadrant. There is interval development of crescent-shaped fluid collection measuring 16.4 x 3.3 cm in the epigastric region and left upper quadrant of the abdomen which may extend into the left lower quadrant. Potentially this may represent abscess or developing  abscess. Multiple other smaller fluid collections are noted which may represent small abscesses. Moderate anasarca is noted. Musculoskeletal: No acute or significant osseous findings.  IMPRESSION: 1. 5.5 x 3.2 cm fluid collection is noted along the greater curvature of the proximal stomach. 2. Surgical drain is again noted in the pelvis with tip in left lower quadrant. 3. Interval development of crescent-shaped fluid collection measuring 16.4 x 3.3 cm in the epigastric region and left upper quadrant of the abdomen which may extend into the left lower quadrant. Potentially this may represent abscess or developing abscess. Multiple other smaller fluid collections are noted which may represent small abscesses. 4. 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 5. Mild amount of free fluid is noted in the posterior pelvis. 6. Moderate anasarca is noted. Aortic Atherosclerosis (ICD10-I70.0). Electronically Signed   By: Marijo Conception M.D.   On: 03/19/2020 17:36   DG CHEST PORT 1 VIEW  Result Date: 03/20/2020 CLINICAL DATA:  Tracheostomy placement EXAM: PORTABLE CHEST 1 VIEW COMPARISON:  Chest radiograph from earlier today. FINDINGS: Right rotated chest radiograph. Tracheostomy tube tip overlies the tracheal air column just below thoracic inlet. Right PICC terminates over lower third of the SVC. Stable cardiomediastinal silhouette with normal heart size. No pneumothorax. No pleural effusion. Emphysema. No pulmonary edema. No acute consolidative airspace disease. IMPRESSION: 1. Well-positioned tracheostomy tube. 2. Emphysema. No acute cardiopulmonary disease. Electronically Signed   By: Ilona Sorrel M.D.   On: 03/20/2020 15:36   DG Chest Port 1 View  Result Date: 03/20/2020 CLINICAL DATA:  Respiratory failure. EXAM: PORTABLE CHEST 1 VIEW COMPARISON:  03/19/2020 FINDINGS: The endotracheal tube is 4.5 cm above the carina. The NG tube is coursing down the esophagus and into the stomach. The left IJ central venous catheter has been removed. There is a new right-sided PICC line with its tip in good position in the distal SVC. Stable underlying emphysematous changes. Upper lobe pulmonary scarring. No  acute infiltrates or effusions. IMPRESSION: 1. New right-sided PICC line in good position. 2. Removal of left IJ central venous catheter. 3. Stable ET tube and NG tube. 4. Underlying emphysematous changes but no acute pulmonary findings. Electronically Signed   By: Marijo Sanes M.D.   On: 03/20/2020 07:25   CT IMAGE GUIDED DRAINAGE BY PERCUTANEOUS CATHETER  Result Date: 03/20/2020 INDICATION: Postop abdominal fluid collections in the left upper quadrant and left lower quadrant EXAM: CT DRAINAGE LEFT UPPER QUADRANT FLUID COLLECTION CT DRAINAGE LEFT LOWER QUADRANT FLUID COLLECTION MEDICATIONS: The patient is currently admitted to the hospital and receiving intravenous antibiotics. The antibiotics were administered within an appropriate time frame prior to the initiation of the procedure. ANESTHESIA/SEDATION: Fentanyl 0 mcg IV; Versed 2 mg IV Moderate Sedation Time:  65 MINUTES The patient was continuously monitored during the procedure by the interventional radiology nurse under my direct supervision. COMPLICATIONS: None immediate. PROCEDURE: Informed written consent was obtained from the patient after a thorough discussion of the procedural risks, benefits and alternatives. All questions were addressed. Maximal Sterile Barrier Technique was utilized including caps, mask, sterile gowns, sterile gloves, sterile drape, hand hygiene and skin antiseptic. A timeout was performed prior to the initiation of the procedure. Previous imaging reviewed. Patient positioned slightly left anterior oblique. Noncontrast localization CT performed. The left upper and lower quadrant fluid collections were localized and marked. CT drainage left lower quadrant fluid collection: Under sterile conditions and local anesthesia, an 18 gauge 15 cm access was advanced percutaneously into the fluid. Needle position confirmed  with CT. Syringe aspiration yielded bloody exudative fluid. Sample sent for culture. Guidewire inserted followed by  tract dilatation to insert a 14 [place] drain. Drain catheter position confirmed with CT. Syringe aspiration yielded 2 in 50 cc fluid. Catheter secured with Prolene suture and connected to external suction bulb. Sterile dressing applied. CT drainage left upper quadrant fluid collection: In a similar fashion, the left upper quadrant collection was localized and marked. Under sterile conditions and local anesthesia, an 18 gauge 15 cm access needle was advanced percutaneously into the fluid. Syringe aspiration yielded exudative fluid. Sample sent for culture. Guidewire inserted followed by tract dilatation to insert a 12 [place] drain. Drain catheter position confirmed with CT. Syringe aspiration yielded 230 cc exudative fluid. Sample sent for culture. Catheter secured with a Prolene suture and connected to external suction bulb. Sterile dressing applied. No immediate complication. Patient tolerated the procedure well. IMPRESSION: Successful CT-guided drainage of the left upper quadrant and left lower quadrant fluid collections. Sample sent from both sites. Electronically Signed   By: Jerilynn Mages.  Shick M.D.   On: 03/20/2020 13:56   CT IMAGE GUIDED DRAINAGE PERCUT CATH  PERITONEAL RETROPERIT  Result Date: 03/20/2020 INDICATION: Postop abdominal fluid collections in the left upper quadrant and left lower quadrant EXAM: CT DRAINAGE LEFT UPPER QUADRANT FLUID COLLECTION CT DRAINAGE LEFT LOWER QUADRANT FLUID COLLECTION MEDICATIONS: The patient is currently admitted to the hospital and receiving intravenous antibiotics. The antibiotics were administered within an appropriate time frame prior to the initiation of the procedure. ANESTHESIA/SEDATION: Fentanyl 0 mcg IV; Versed 2 mg IV Moderate Sedation Time:  83 MINUTES The patient was continuously monitored during the procedure by the interventional radiology nurse under my direct supervision. COMPLICATIONS: None immediate. PROCEDURE: Informed written consent was obtained from the  patient after a thorough discussion of the procedural risks, benefits and alternatives. All questions were addressed. Maximal Sterile Barrier Technique was utilized including caps, mask, sterile gowns, sterile gloves, sterile drape, hand hygiene and skin antiseptic. A timeout was performed prior to the initiation of the procedure. Previous imaging reviewed. Patient positioned slightly left anterior oblique. Noncontrast localization CT performed. The left upper and lower quadrant fluid collections were localized and marked. CT drainage left lower quadrant fluid collection: Under sterile conditions and local anesthesia, an 18 gauge 15 cm access was advanced percutaneously into the fluid. Needle position confirmed with CT. Syringe aspiration yielded bloody exudative fluid. Sample sent for culture. Guidewire inserted followed by tract dilatation to insert a 14 [place] drain. Drain catheter position confirmed with CT. Syringe aspiration yielded 2 in 50 cc fluid. Catheter secured with Prolene suture and connected to external suction bulb. Sterile dressing applied. CT drainage left upper quadrant fluid collection: In a similar fashion, the left upper quadrant collection was localized and marked. Under sterile conditions and local anesthesia, an 18 gauge 15 cm access needle was advanced percutaneously into the fluid. Syringe aspiration yielded exudative fluid. Sample sent for culture. Guidewire inserted followed by tract dilatation to insert a 12 [place] drain. Drain catheter position confirmed with CT. Syringe aspiration yielded 230 cc exudative fluid. Sample sent for culture. Catheter secured with a Prolene suture and connected to external suction bulb. Sterile dressing applied. No immediate complication. Patient tolerated the procedure well. IMPRESSION: Successful CT-guided drainage of the left upper quadrant and left lower quadrant fluid collections. Sample sent from both sites. Electronically Signed   By: Jerilynn Mages.  Shick M.D.    On: 03/20/2020 13:56   [name] EKG SITE RITE  Result Date:  03/19/2020 If Site Rite image not attached, placement could not be confirmed due to current cardiac rhythm.   Anti-infectives: Anti-infectives (From admission, onward)   Start     Dose/Rate Route Frequency Ordered Stop   03/18/20 0900  meropenem (MERREM) 1 g in sodium chloride 0.9 % 100 mL IVPB     1 g 200 mL/hr over 30 Minutes Intravenous Every 8 hours 03/18/20 0838     03/15/20 2200  meropenem (MERREM) 1 g in sodium chloride 0.9 % 100 mL IVPB  Status:  Discontinued     1 g 200 mL/hr over 30 Minutes Intravenous Every 12 hours 03/15/20 1021 03/18/20 0838   03/14/20 1000  anidulafungin (ERAXIS) 100 mg in sodium chloride 0.9 % 100 mL IVPB  Status:  Discontinued     100 mg 78 mL/hr over 100 Minutes Intravenous Every 24 hours 03/13/20 0752 03/18/20 0849   03/13/20 0900  anidulafungin (ERAXIS) 200 mg in sodium chloride 0.9 % 200 mL IVPB     200 mg 78 mL/hr over 200 Minutes Intravenous  Once 03/13/20 0752 03/13/20 1241   03/11/20 2000  meropenem (MERREM) 500 mg in sodium chloride 0.9 % 100 mL IVPB  Status:  Discontinued     500 mg 200 mL/hr over 30 Minutes Intravenous Every 12 hours 03/11/20 1658 03/15/20 1021   03/11/20 1200  piperacillin-tazobactam (ZOSYN) IVPB 2.25 g  Status:  Discontinued     2.25 g 100 mL/hr over 30 Minutes Intravenous Every 6 hours 03/11/20 1058 03/11/20 1641   03/06/20 1200  piperacillin-tazobactam (ZOSYN) IVPB 3.375 g  Status:  Discontinued     3.375 g 12.5 mL/hr over 240 Minutes Intravenous Every 8 hours 03/06/20 0739 03/11/20 1058   03/05/20 1400  piperacillin-tazobactam (ZOSYN) IVPB 2.25 g  Status:  Discontinued     2.25 g 100 mL/hr over 30 Minutes Intravenous Every 8 hours 03/05/20 0752 03/06/20 0739   03/04/20 2200  piperacillin-tazobactam (ZOSYN) IVPB 3.375 g  Status:  Discontinued     3.375 g 12.5 mL/hr over 240 Minutes Intravenous Every 8 hours 03/03/20 2032 03/05/20 0752   03/03/20 1545  cefoTEtan  (CEFOTAN) 2 g in sodium chloride 0.9 % 100 mL IVPB     2 g 200 mL/hr over 30 Minutes Intravenous On call to O.R. 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 Marcello Moores 03/03/20 POD#17 S/p exploratory laparotomy and colon resection with transverse colostomy 3/31 Dr. Hassell Done  - POD#8 -BID wet to drydressing changesto midline abdominal wound - WOC teamfollowing for ostomy teaching - JP drain serous - s/p IR drain x2 4/7, follow cultures - ostomy viable and having some bowel function  FEN -NPO, TPN, TF at 10cc/hr VTE - SCDs,SQ heparin ID - Zosyn3/21>3/29; Eraxis 3/31>>4/5,Merrem 3/29>> Foley -wick  Plan: Continue drains and follow cultures. Will start tube feedings at 10cc/hr, do not advance past this yet.   LOS: 18 days    [facility] 03/21/2020, 8:22 AM Please see Amion for pager number during day hours 7:00am-4:30pm

## 2020-03-21 NOTE — Progress Notes (Signed)
Nutrition Follow-up  RD working remotely.   DOCUMENTATION CODES:   Not applicable  INTERVENTION:  - place small bore NGT. - will order Vital High Protein @ 10 ml/hr which will provide 240 kcal, 21 grams protein, and 201 ml free water.  - goal rate for TF: Vital High Protein @ 55 ml/hr with 30 ml prostat once/day to provide 1420 kcal, 130 grams protein, and 1103 ml free water.    NUTRITION DIAGNOSIS:   Inadequate oral intake related to inability to eat as evidenced by NPO status. -ongoing  GOAL:   Patient will meet greater than or equal to 90% of their needs -unmet at this time.  MONITOR:   Vent status, TF tolerance, Labs, Weight trends  REASON FOR ASSESSMENT:   Consult Enteral/tube feeding initiation and management  ASSESSMENT:   69 year-old female with medical history of asthma, COPD, hemorrhoids, osteopenia, vitamin D deficiency, and seasonal allergies. She presented to the ED on 3/20 due to abdominal pain with associated nausea and diarrhea. These symptoms have been ongoing for several months.  Significant Events: 3/21- admission; emergent low anterior resection/colostomy formation 3/27- intubation and OGT placement 3/30- ex lap with colon resection and transverse colostomy 4/1- TPN initiation 4/6- double lumen PICC placed in R brachial 4/7- tracheostomy   Patient remains intubated. Consult for TF initiation and management with TF to remain at 10 ml/hr until further notice. No NGT/OGT in place at this time.   She is receiving custom TPN at goal rate of 80 ml/hr. Plan to start ILE today. TPN had been discontinued at ~0600 on 4/6 d/t hyperkalemia; D10 @ 40 ml/hr started at that time and later increased to 80 ml/hr. TPN was reformulated and re-started on 4/7.   Per notes: - severe lung disease and anxiety--unclear if she will be able to be liberated from the vent - bowel perf s/p partial colectomy - new dx of colon cancer this admission - anasarca--worse today  (4/8) - hypernatremia--no Na in TPN - acute anemia - ostomy with some bowel function   Patient is currently intubated on ventilator support MV: 9.8 L/min Temp (24hrs), Avg:98.5 F (36.9 C), Min:97.9 F (36.6 C), Max:99.2 F (37.3 C) Propofol: none    Labs reviewed; CBGs: 127 and 150 mg/dl, Na: 147 mmol/l, Cl: 113 mmol/l, BUN: 61 mg/dl, Ca: 8.3 mg/dl. Medications reviewed; sliding scale novolog, 2 g IV Mg sulfate x1 run 4/7, 40 mg IV protonix/day, 10 mEq IV KCl x2 runs 4/7. IVF; D5 @ 50 ml/hr (204 kcal). Drips; precedex @ 0.3 mcg/kg/hr, fentanyl @ 175 mcg/hr   Diet Order:   Diet Order            Diet NPO time specified  Diet effective midnight              EDUCATION NEEDS:   No education needs have been identified at this time  Skin:  Skin Assessment: Skin Integrity Issues: Skin Integrity Issues:: Incisions Incisions: abdomen (3/21 and 3/30)  Last BM:  4/8  Height:   Ht Readings from Last 1 Encounters:  03/09/20 5\' 2"  (1.575 m)    Weight:   Wt Readings from Last 1 Encounters:  03/21/20 71.1 kg    Ideal Body Weight:  50 kg  BMI:  Body mass index is 28.67 kg/m.  Estimated Nutritional Needs:   Kcal:  1420 kcal  Protein:  127-140  Fluid:  >/= 1.5 L/day     Jarome Matin, MS, RD, LDN, CNSC Inpatient Clinical Dietitian RD pager #  available in AMION  After hours/weekend pager # available in AMION  

## 2020-03-21 NOTE — Progress Notes (Signed)
Referring Physician(s): Byerly,F  Supervising Physician: Jacqulynn Cadet  Patient Status:  [facility] - In-pt  Chief Complaint:  Abdominal fluid collections  Subjective: Pt sedated, trach in place   Allergies: Codeine  Medications: Prior to Admission medications   Medication Sig Start Date End Date Taking? Authorizing Provider  acetaminophen (TYLENOL) 325 MG tablet Take 650 mg by mouth every 6 (six) hours as needed for moderate pain.   Yes [provider]  albuterol (PROVENTIL) (2.5 MG/3ML) 0.083% nebulizer solution INHALE 3ML VIA NEBULIZER EVERY 4 HOURS AS NEEDED FOR SHORTNESS OF BREATH 11/23/19  Yes Icard, Bradley L, DO  b complex vitamins tablet Take 1 tablet by mouth daily.   Yes [provider]  Fluticasone-Umeclidin-Vilant (TRELEGY ELLIPTA) 100-62.5-25 MCG/INH AEPB Inhale 1 puff into the lungs daily. 02/15/20  Yes Icard, Bradley L, DO  VITAMIN D, CHOLECALCIFEROL, PO Take 1 tablet by mouth daily.   Yes [provider]     Vital Signs: BP 90/61   Pulse (!) 110   Temp 99.4 F (37.4 C) (Axillary)   Resp (!) 27   Ht 5\' 2"  (1.575 m)   Wt 156 lb 12 oz (71.1 kg)   LMP 12/14/2000   SpO2 96%   BMI 28.67 kg/m   Physical Exam sedated; left abd drains intact, OP range from 60-80 cc yellow to thick bloody fluid; no issues with flushing per nurse  Imaging: DG Abd 1 View  Result Date: 03/21/2020 CLINICAL DATA:  Check gastric catheter placement EXAM: ABDOMEN - 1 VIEW COMPARISON:  03/09/2020 FINDINGS: Gastric catheter is noted with its tip in the stomach. The proximal side port lies in the distal esophagus. This should be advanced several cm further into the stomach. Multiple surgical drains are identified both in the pelvis as well as in the left mid abdomen. No free air is seen. No bony abnormality is noted. IMPRESSION: Gastric catheter as described.  This should be advanced several cm. Multiple drainage catheters are identified. Electronically Signed    By: Inez Catalina M.D.   On: 03/21/2020 13:01   CT CHEST W CONTRAST  Result Date: 03/19/2020 CLINICAL DATA:  Abdominal abscess or infection. EXAM: CT CHEST, ABDOMEN, AND PELVIS WITH CONTRAST TECHNIQUE: Multidetector CT imaging of the chest, abdomen and pelvis was performed following the standard protocol during bolus administration of intravenous contrast. CONTRAST:  144mL OMNIPAQUE IOHEXOL 300 MG/ML  SOLN COMPARISON:  March 12, 2020. FINDINGS: CT CHEST FINDINGS Cardiovascular: No significant vascular findings. Normal heart size. No pericardial effusion. Mediastinum/Nodes: Endotracheal tube is in grossly good position. Thyroid gland is unremarkable. No adenopathy is noted. Nasogastric tube is seen passing through esophagus into stomach. Lungs/Pleura: No pneumothorax or pleural effusion is noted. Minimal right posterior basilar subsegmental atelectasis is noted. Musculoskeletal: No chest wall mass or suspicious bone lesions identified. CT ABDOMEN PELVIS FINDINGS Hepatobiliary: No focal liver abnormality is seen. No gallstones, gallbladder wall thickening, or biliary dilatation. Pancreas: Unremarkable. No pancreatic ductal dilatation or surrounding inflammatory changes. Spleen: Normal in size without focal abnormality. Adrenals/Urinary Tract: Adrenal glands are unremarkable. Kidneys are normal, without renal calculi, focal lesion, or hydronephrosis. Bladder is unremarkable. Stomach/Bowel: Nasogastric tube tip is seen in proximal stomach. 5.5 x 3.2 cm fluid collection is noted along the greater curvature of the proximal stomach. 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 No definite evidence of bowel obstruction is noted. Vascular/Lymphatic: Aortic atherosclerosis. No enlarged abdominal or pelvic lymph nodes. Reproductive: Uterus and bilateral adnexa are unremarkable. Other: Mild amount of free  fluid is noted in the posterior pelvis. Surgical drain is again noted in the pelvis with tip in the left lower quadrant.  There is interval development of crescent-shaped fluid collection measuring 16.4 x 3.3 cm in the epigastric region and left upper quadrant of the abdomen which may extend into the left lower quadrant. Potentially this may represent abscess or developing abscess. Multiple other smaller fluid collections are noted which may represent small abscesses. Moderate anasarca is noted. Musculoskeletal: No acute or significant osseous findings. IMPRESSION: 1. 5.5 x 3.2 cm fluid collection is noted along the greater curvature of the proximal stomach. 2. Surgical drain is again noted in the pelvis with tip in left lower quadrant. 3. Interval development of crescent-shaped fluid collection measuring 16.4 x 3.3 cm in the epigastric region and left upper quadrant of the abdomen which may extend into the left lower quadrant. Potentially this may represent abscess or developing abscess. Multiple other smaller fluid collections are noted which may represent small abscesses. 4. 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 5. Mild amount of free fluid is noted in the posterior pelvis. 6. Moderate anasarca is noted. Aortic Atherosclerosis (ICD10-I70.0). Electronically Signed   By: Marijo Conception M.D.   On: 03/19/2020 17:36   CT ABDOMEN PELVIS W CONTRAST  Result Date: 03/19/2020 CLINICAL DATA:  Abdominal abscess or infection. EXAM: CT CHEST, ABDOMEN, AND PELVIS WITH CONTRAST TECHNIQUE: Multidetector CT imaging of the chest, abdomen and pelvis was performed following the standard protocol during bolus administration of intravenous contrast. CONTRAST:  157mL OMNIPAQUE IOHEXOL 300 MG/ML  SOLN COMPARISON:  March 12, 2020. FINDINGS: CT CHEST FINDINGS Cardiovascular: No significant vascular findings. Normal heart size. No pericardial effusion. Mediastinum/Nodes: Endotracheal tube is in grossly good position. Thyroid gland is unremarkable. No adenopathy is noted. Nasogastric tube is seen passing through esophagus into stomach.  Lungs/Pleura: No pneumothorax or pleural effusion is noted. Minimal right posterior basilar subsegmental atelectasis is noted. Musculoskeletal: No chest wall mass or suspicious bone lesions identified. CT ABDOMEN PELVIS FINDINGS Hepatobiliary: No focal liver abnormality is seen. No gallstones, gallbladder wall thickening, or biliary dilatation. Pancreas: Unremarkable. No pancreatic ductal dilatation or surrounding inflammatory changes. Spleen: Normal in size without focal abnormality. Adrenals/Urinary Tract: Adrenal glands are unremarkable. Kidneys are normal, without renal calculi, focal lesion, or hydronephrosis. Bladder is unremarkable. Stomach/Bowel: Nasogastric tube tip is seen in proximal stomach. 5.5 x 3.2 cm fluid collection is noted along the greater curvature of the proximal stomach. 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 No definite evidence of bowel obstruction is noted. Vascular/Lymphatic: Aortic atherosclerosis. No enlarged abdominal or pelvic lymph nodes. Reproductive: Uterus and bilateral adnexa are unremarkable. Other: Mild amount of free fluid is noted in the posterior pelvis. Surgical drain is again noted in the pelvis with tip in the left lower quadrant. There is interval development of crescent-shaped fluid collection measuring 16.4 x 3.3 cm in the epigastric region and left upper quadrant of the abdomen which may extend into the left lower quadrant. Potentially this may represent abscess or developing abscess. Multiple other smaller fluid collections are noted which may represent small abscesses. Moderate anasarca is noted. Musculoskeletal: No acute or significant osseous findings. IMPRESSION: 1. 5.5 x 3.2 cm fluid collection is noted along the greater curvature of the proximal stomach. 2. Surgical drain is again noted in the pelvis with tip in left lower quadrant. 3. Interval development of crescent-shaped fluid collection measuring 16.4 x 3.3 cm in the epigastric region and left upper  quadrant  of the abdomen which may extend into the left lower quadrant. Potentially this may represent abscess or developing abscess. Multiple other smaller fluid collections are noted which may represent small abscesses. 4. 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 5. Mild amount of free fluid is noted in the posterior pelvis. 6. Moderate anasarca is noted. Aortic Atherosclerosis (ICD10-I70.0). Electronically Signed   By: Marijo Conception M.D.   On: 03/19/2020 17:36   DG CHEST PORT 1 VIEW  Result Date: 03/20/2020 CLINICAL DATA:  Tracheostomy placement EXAM: PORTABLE CHEST 1 VIEW COMPARISON:  Chest radiograph from earlier today. FINDINGS: Right rotated chest radiograph. Tracheostomy tube tip overlies the tracheal air column just below thoracic inlet. Right PICC terminates over lower third of the SVC. Stable cardiomediastinal silhouette with normal heart size. No pneumothorax. No pleural effusion. Emphysema. No pulmonary edema. No acute consolidative airspace disease. IMPRESSION: 1. Well-positioned tracheostomy tube. 2. Emphysema. No acute cardiopulmonary disease. Electronically Signed   By: Ilona Sorrel M.D.   On: 03/20/2020 15:36   DG Chest Port 1 View  Result Date: 03/20/2020 CLINICAL DATA:  Respiratory failure. EXAM: PORTABLE CHEST 1 VIEW COMPARISON:  03/19/2020 FINDINGS: The endotracheal tube is 4.5 cm above the carina. The NG tube is coursing down the esophagus and into the stomach. The left IJ central venous catheter has been removed. There is a new right-sided PICC line with its tip in good position in the distal SVC. Stable underlying emphysematous changes. Upper lobe pulmonary scarring. No acute infiltrates or effusions. IMPRESSION: 1. New right-sided PICC line in good position. 2. Removal of left IJ central venous catheter. 3. Stable ET tube and NG tube. 4. Underlying emphysematous changes but no acute pulmonary findings. Electronically Signed   By: Marijo Sanes M.D.   On: 03/20/2020 07:25   DG  CHEST PORT 1 VIEW  Result Date: 03/19/2020 CLINICAL DATA:  Respiratory failure EXAM: PORTABLE CHEST 1 VIEW COMPARISON:  Three days ago FINDINGS: Endotracheal tube tip is halfway between the clavicular heads and carina. Left IJ line with tip at the SVC. The enteric tube at least reaches the stomach. Large lung volumes with lucency at the bases. No visible effusion or pneumothorax. No airspace disease. IMPRESSION: Stable hardware positioning and hyperinflated/lucent lung bases. Electronically Signed   By: Monte Fantasia M.D.   On: 03/19/2020 07:26   CT IMAGE GUIDED DRAINAGE BY PERCUTANEOUS CATHETER  Result Date: 03/20/2020 INDICATION: Postop abdominal fluid collections in the left upper quadrant and left lower quadrant EXAM: CT DRAINAGE LEFT UPPER QUADRANT FLUID COLLECTION CT DRAINAGE LEFT LOWER QUADRANT FLUID COLLECTION MEDICATIONS: The patient is currently admitted to the hospital and receiving intravenous antibiotics. The antibiotics were administered within an appropriate time frame prior to the initiation of the procedure. ANESTHESIA/SEDATION: Fentanyl 0 mcg IV; Versed 2 mg IV Moderate Sedation Time:  79 MINUTES The patient was continuously monitored during the procedure by the interventional radiology nurse under my direct supervision. COMPLICATIONS: None immediate. PROCEDURE: Informed written consent was obtained from the patient after a thorough discussion of the procedural risks, benefits and alternatives. All questions were addressed. Maximal Sterile Barrier Technique was utilized including caps, mask, sterile gowns, sterile gloves, sterile drape, hand hygiene and skin antiseptic. A timeout was performed prior to the initiation of the procedure. Previous imaging reviewed. Patient positioned slightly left anterior oblique. Noncontrast localization CT performed. The left upper and lower quadrant fluid collections were localized and marked. CT drainage left lower quadrant fluid collection: Under sterile  conditions and local anesthesia,  an 18 gauge 15 cm access was advanced percutaneously into the fluid. Needle position confirmed with CT. Syringe aspiration yielded bloody exudative fluid. Sample sent for culture. Guidewire inserted followed by tract dilatation to insert a 14 [place] drain. Drain catheter position confirmed with CT. Syringe aspiration yielded 2 in 50 cc fluid. Catheter secured with Prolene suture and connected to external suction bulb. Sterile dressing applied. CT drainage left upper quadrant fluid collection: In a similar fashion, the left upper quadrant collection was localized and marked. Under sterile conditions and local anesthesia, an 18 gauge 15 cm access needle was advanced percutaneously into the fluid. Syringe aspiration yielded exudative fluid. Sample sent for culture. Guidewire inserted followed by tract dilatation to insert a 12 [place] drain. Drain catheter position confirmed with CT. Syringe aspiration yielded 230 cc exudative fluid. Sample sent for culture. Catheter secured with a Prolene suture and connected to external suction bulb. Sterile dressing applied. No immediate complication. Patient tolerated the procedure well. IMPRESSION: Successful CT-guided drainage of the left upper quadrant and left lower quadrant fluid collections. Sample sent from both sites. Electronically Signed   By: Jerilynn Mages.  Shick M.D.   On: 03/20/2020 13:56   CT IMAGE GUIDED DRAINAGE PERCUT CATH  PERITONEAL RETROPERIT  Result Date: 03/20/2020 INDICATION: Postop abdominal fluid collections in the left upper quadrant and left lower quadrant EXAM: CT DRAINAGE LEFT UPPER QUADRANT FLUID COLLECTION CT DRAINAGE LEFT LOWER QUADRANT FLUID COLLECTION MEDICATIONS: The patient is currently admitted to the hospital and receiving intravenous antibiotics. The antibiotics were administered within an appropriate time frame prior to the initiation of the procedure. ANESTHESIA/SEDATION: Fentanyl 0 mcg IV; Versed 2 mg IV Moderate  Sedation Time:  48 MINUTES The patient was continuously monitored during the procedure by the interventional radiology nurse under my direct supervision. COMPLICATIONS: None immediate. PROCEDURE: Informed written consent was obtained from the patient after a thorough discussion of the procedural risks, benefits and alternatives. All questions were addressed. Maximal Sterile Barrier Technique was utilized including caps, mask, sterile gowns, sterile gloves, sterile drape, hand hygiene and skin antiseptic. A timeout was performed prior to the initiation of the procedure. Previous imaging reviewed. Patient positioned slightly left anterior oblique. Noncontrast localization CT performed. The left upper and lower quadrant fluid collections were localized and marked. CT drainage left lower quadrant fluid collection: Under sterile conditions and local anesthesia, an 18 gauge 15 cm access was advanced percutaneously into the fluid. Needle position confirmed with CT. Syringe aspiration yielded bloody exudative fluid. Sample sent for culture. Guidewire inserted followed by tract dilatation to insert a 14 [place] drain. Drain catheter position confirmed with CT. Syringe aspiration yielded 2 in 50 cc fluid. Catheter secured with Prolene suture and connected to external suction bulb. Sterile dressing applied. CT drainage left upper quadrant fluid collection: In a similar fashion, the left upper quadrant collection was localized and marked. Under sterile conditions and local anesthesia, an 18 gauge 15 cm access needle was advanced percutaneously into the fluid. Syringe aspiration yielded exudative fluid. Sample sent for culture. Guidewire inserted followed by tract dilatation to insert a 12 [place] drain. Drain catheter position confirmed with CT. Syringe aspiration yielded 230 cc exudative fluid. Sample sent for culture. Catheter secured with a Prolene suture and connected to external suction bulb. Sterile dressing applied. No  immediate complication. Patient tolerated the procedure well. IMPRESSION: Successful CT-guided drainage of the left upper quadrant and left lower quadrant fluid collections. Sample sent from both sites. Electronically Signed   By: Jerilynn Mages.  Shick M.D.  On: 03/20/2020 13:56   [name] EKG SITE RITE  Result Date: 03/19/2020 If Site Rite image not attached, placement could not be confirmed due to current cardiac rhythm.   Labs:  CBC: Recent Labs    03/15/20 1046 03/18/20 0441 03/19/20 0415 03/21/20 0327  WBC 17.7* 21.6* 18.4* 17.7*  HGB 9.7* 9.3* 8.8* 9.2*  HCT 29.0* 29.6* 28.9* 30.0*  PLT 316 481* 450* 390    COAGS: Recent Labs    03/09/20 1752  INR 1.3*    BMP: Recent Labs    03/19/20 0415 03/19/20 1147 03/20/20 0501 03/21/20 0327  NA 150* 150* 137 147*  K 5.2* 4.7 3.9 4.6  CL 111 113* 101 113*  CO2 31 30 27 27   GLUCOSE 173* 129* 442* 147*  BUN 86* 83* 62* 61*  CALCIUM 9.3 9.4 8.1* 8.3*  CREATININE 0.75 0.71 0.71 0.60  GFRNONAA >60 >60 >60 >60  GFRAA >60 >60 >60 >60    LIVER FUNCTION TESTS: Recent Labs    03/18/20 0441 03/19/20 0415 03/20/20 0501 03/21/20 0327  BILITOT 0.6 0.8 0.8 1.2  AST 24 23 38 31  ALT 21 22 40 32  ALKPHOS 82 78 87 79  PROT 5.7* 5.3* 4.9* 4.8*  ALBUMIN 3.3* 2.9* 2.7* 2.5*    Assessment and Plan: Patient with history of perforated sigmoid colon, proximal rectal cancer with prior LAR/colon resection/colostomy; post op left abdominal fluid collections, status post drain placement x2 on 4/7; temp 99.4, WBC 17.7 down slightly from 18.4, hemoglobin 9.2 up from 8.8, creatinine 0.6, abd drain fluid cultures pending( preliminary shows Pseudomonas); continue with drain irrigation, output monitoring, lab checks.  Once drain outputs are less than 10 to 15 cc/day for 2 to 3 days obtain follow-up CT.  Other plans per surgery/critical care  Electronically Signed: D. Rowe Robert, PA-C 03/21/2020, 5:40 PM   I spent a total of 15 minutes at the the  patient's bedside AND on the patient's hospital floor or unit, greater than 50% of which was counseling/coordinating care for left abdominal fluid collection drains    Patient ID: Tracy [name], female   DOB: September 23, 1951, 69 y.o.   MRN: [id number]

## 2020-03-21 NOTE — Progress Notes (Signed)
NAME:  Tracy [name], MRN:  [id number], DOB:  [birth year], LOS: 14 ADMISSION DATE:  03/03/2020, CONSULTATION DATE:  3/27  REFERRING MD:  Ninfa Linden, CHIEF COMPLAINT:  Abdominal pain   Brief History   69 y/o female admitted 3/21 with bowel perforation requiring colostomy and lower anterior resection in setting of newly diagnosed proximal rectal cancer with sigmoid perforation.  Post op developed septic shock, HCAP AKI ileus and acute hypoxemic respiratory failure requiring intubation.   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 Valeta Harms. Not on O2.  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, metastatic carcinoma in 3/15 lymph nodes  LE [name] 3/27 >> negative for DVT  ECHO 3/27 >> poor windows, normal LV function, severely dilated RV with moderately reduced function, RV volume and pressure overload, mildly dilated RA  V/Q Scan 3/30 >> low probability for PE  4/6 CT chest abdomen pelvis> 5.5x3.2 fluid collection along stomach, surgical drain again noted in pelvis, interval development of crescent shaped  fluid collection measuring 16.4 x 3.3 in epigastric region and left upper quadrant of abdomen (could be abscess) multiple other smaller fluid collections, colostomy noted, mild free fluid in posterior pelvis  Micro Data:  COVID 3/21 >> Negative MRSA PCR 3/21 >> Negative Blood cultures 3/27 >> negative  Urine culture 3/26 >> insignificant growth  Tracheal Aspirate 3/29 >> Saccharomyces cerevisiae Trach aspirate 4/2>>  Antimicrobials:  Zosyn 3/21 >> 3/29 Anidulafungin 3/31 >> 4/5 Meropenem 3/29 >>   Interim history/subjective:   Tracheostomy yesterday Tolerated well More awake and alert this morning   Objective   Blood pressure 94/69, pulse (!) 107, temperature 98.6 F (37 C), temperature source Axillary, resp. rate (!) 31, height 5\' 2"  (1.575 m), weight 71.1 kg, last menstrual period 12/14/2000, SpO2 96 %.    Vent Mode: PRVC FiO2 (%):  [30 %] 30 % Set Rate:  [10 bmp] 10 bmp Vt Set:  [400 mL] 400 mL PEEP:  [5 cmH20] 5 cmH20 Plateau Pressure:  [14 cmH20-21 cmH20] 16 cmH20   Intake/Output Summary (Last 24 hours) at 03/21/2020 0723 Last data filed at 03/21/2020 0600 Gross per 24 hour  Intake 2241.6 ml  Output 2775 ml  Net -533.4 ml   Filed Weights   03/19/20 0423 03/20/20 0500 03/21/20 0333  Weight: 70.4 kg 70.3 kg 71.1 kg    Examination:  General:  In bed on vent HENT: NCAT ETT in place PULM: CTA B, vent supported breathing CV: RRR, no mgr GI: BS+, soft, nontender MSK: normal bulk and tone Neuro: sedated on vent   4/7 CXR images personally reviewed> emphysema bilaterally,tracheostomy  in place  Resolved Hospital Problem list     Assessment & Plan:  Acute hypoxemic respiratory failure> pressure support trial limited by anxiety/pain and severe lung disease, unclear if will be able to be liberated from ventialtor.  Endotracheal tube contributing to anxiety. Baseline GOLD III/IV COPD;  Severe emphysema Pressure support as long as tolerated Full mechanical vent  support VAP prevention Daily WUA/SBT  Bowel perforation/partial colectomy Colon Cancer New fluid collection on 4/6 CT abdomen > drain in place Malnutrition, moderate Per general surgery Starting trickle feeding today  Anasarca, worse 4/8 Hypernatremia > back up again AKI > resolved Monitor BMET and UOP Replace electrolytes as needed Lasix again today Restart D5 Free water via tube> low dose  Hypertension Monitor hemodynamics  Acute anemia, stable 4/4 without bleeding Monitor for bleeding Transfuse PRBC for Hgb < 7 gm/dL  Hyperglycemia SSI + glargine  Need for sedation for mechanical ventilation RASS goal 0 to -1 PAD protocol: continue precedex, prn versed, d/c fentanyl, use prn dilaudid   Best practice:  Diet: TPN Pain/Anxiety/Delirium protocol (if indicated): yes, as above VAP protocol (if indicated): yes DVT prophylaxis: sub q heparin  GI prophylaxis: PPI Glucose control: as above Mobility: bed rest Code Status: full Family Communication: Danny updated by bedside Disposition: remain in ICU  Labs   CBC: Recent Labs  Lab 03/15/20 1046 03/18/20 0441 03/19/20 0415 03/21/20 0327  WBC 17.7* 21.6* 18.4* 17.7*  NEUTROABS 16.3* 19.6*  --   --   HGB 9.7* 9.3* 8.8* 9.2*  HCT 29.0* 29.6* 28.9* 30.0*  MCV 93.2 96.7 99.0 101.4*  PLT 316 481* 450* [id number]    Basic Metabolic Panel: Recent Labs  Lab 03/17/20 0226 03/17/20 0226 03/18/20 0441 03/19/20 0415 03/19/20 1147 03/20/20 0501 03/21/20 0327  NA 150*   < > 150* 150* 150* 137 147*  K 4.5   < > 4.7 5.2* 4.7 3.9 4.6  CL 112*   < > 110 111 113* 101 113*  CO2 27   < > 32 31 30 27 27   GLUCOSE 186*   < > 168* 173* 129* 442* 147*  BUN 79*   < > 93* 86* 83* 62* 61*  CREATININE 1.03*   < > 0.89 0.75 0.71 0.71 0.60  CALCIUM 9.5   < > 9.3 9.3 9.4 8.1* 8.3*  MG 2.0  --  2.0 2.0  --  1.6* 2.3  PHOS 2.4*  --  3.2 3.0  --  3.1 3.1   < > = values in this interval not displayed.   GFR: Estimated Creatinine  Clearance: 63.9 mL/min (by C-G formula based on SCr of 0.6 mg/dL). Recent Labs  Lab 03/15/20 1046 03/18/20 0441 03/19/20 0415 03/21/20 0327  WBC 17.7* 21.6* 18.4* 17.7*    Liver Function Tests: Recent Labs  Lab 03/16/20 0355 03/18/20 0441 03/19/20 0415 03/20/20 0501 03/21/20 0327  AST 20 24 23  38 31  ALT 22 21 22  40 32  ALKPHOS 60 82 78 87 79  BILITOT 0.8 0.6 0.8 0.8 1.2  PROT 5.0* 5.7* 5.3* 4.9* 4.8*  ALBUMIN 2.8* 3.3* 2.9* 2.7* 2.5*   No results for input(s): LIPASE, AMYLASE in the last 168 hours. No results for input(s): AMMONIA in the last 168 hours.  ABG    Component Value Date/Time   PHART 7.398 03/16/2020 2245   PCO2ART 45.6 03/16/2020 2245   PO2ART 70.3 (L) 03/16/2020 2245   HCO3 27.7 03/16/2020 2245   TCO2 24 03/12/2020 1513   ACIDBASEDEF 4.8 (H)  03/12/2020 1738   O2SAT 93.5 03/16/2020 2245     Coagulation Profile: No results for input(s): INR, PROTIME in the last 168 hours.  Cardiac Enzymes: No results for input(s): CKTOTAL, CKMB, CKMBINDEX, TROPONINI in the last 168 hours.  HbA1C: Hgb A1c MFr Bld  Date/Time Value Ref Range Status  03/11/2020 05:00 AM 5.9 (H) 4.8 - 5.6 % Final    Comment:    (NOTE) Pre diabetes:          5.7%-6.4% Diabetes:              >6.4% Glycemic control for   <7.0% adults with diabetes     CBG: Recent Labs  Lab 03/20/20 1633 03/20/20 1946 03/20/20 2314 03/21/20 0314 03/21/20 0714  GLUCAP 111* 145* 128* 127* 150*     Critical care time: 32 minutes     Roselie Awkward, MD [place] [facility] Pager: [phone] Cell: [phone] If no response, call [phone]

## 2020-03-22 ENCOUNTER — Inpatient Hospital Stay (HOSPITAL_COMMUNITY): Payer: Medicare HMO

## 2020-03-22 LAB — COMPREHENSIVE METABOLIC PANEL
ALT: 27 U/L (ref 0–44)
AST: 28 U/L (ref 15–41)
Albumin: 2.4 g/dL — ABNORMAL LOW (ref 3.5–5.0)
Alkaline Phosphatase: 84 U/L (ref 38–126)
Anion gap: 11 (ref 5–15)
BUN: 65 mg/dL — ABNORMAL HIGH (ref 8–23)
CO2: 29 mmol/L (ref 22–32)
Calcium: 8.3 mg/dL — ABNORMAL LOW (ref 8.9–10.3)
Chloride: 107 mmol/L (ref 98–111)
Creatinine, Ser: 0.59 mg/dL (ref 0.44–1.00)
GFR calc Af Amer: >60 mL/min (ref >60)
GFR calc non Af Amer: >60 mL/min (ref >60)
Glucose, Bld: 147 mg/dL — ABNORMAL HIGH (ref 70–99)
Potassium: 3 mmol/L — ABNORMAL LOW (ref 3.5–5.1)
Sodium: 147 mmol/L — ABNORMAL HIGH (ref 135–145)
Total Bilirubin: 0.5 mg/dL (ref 0.3–1.2)
Total Protein: 4.9 g/dL — ABNORMAL LOW (ref 6.5–8.1)

## 2020-03-22 LAB — GLUCOSE, CAPILLARY
Glucose-Capillary: 138 mg/dL — ABNORMAL HIGH (ref 70–99)
Glucose-Capillary: 157 mg/dL — ABNORMAL HIGH (ref 70–99)
Glucose-Capillary: 136 mg/dL — ABNORMAL HIGH (ref 70–99)
Glucose-Capillary: 165 mg/dL — ABNORMAL HIGH (ref 70–99)
Glucose-Capillary: 148 mg/dL — ABNORMAL HIGH (ref 70–99)
Glucose-Capillary: 152 mg/dL — ABNORMAL HIGH (ref 70–99)

## 2020-03-22 LAB — PHOSPHORUS: Phosphorus: 3.2 mg/dL (ref 2.5–4.6)

## 2020-03-22 LAB — MAGNESIUM: Magnesium: 1.9 mg/dL (ref 1.7–2.4)

## 2020-03-22 MED ORDER — FUROSEMIDE 10 MG/ML IJ SOLN
40.0000 mg | Freq: Four times a day (QID) | INTRAMUSCULAR | Status: AC
  Administered 2020-03-22 (×2): 40 mg via INTRAVENOUS
  Filled 2020-03-22 (×2): qty 4

## 2020-03-22 MED ORDER — TRAVASOL 10 % IV SOLN
INTRAVENOUS | Status: AC
  Filled 2020-03-22: qty 1248

## 2020-03-22 MED ORDER — MIDAZOLAM HCL 2 MG/2ML IJ SOLN: 0.5000 mg | INTRAMUSCULAR | Status: DC | PRN

## 2020-03-22 MED ORDER — POTASSIUM CHLORIDE 10 MEQ/50ML IV SOLN
10.0000 meq | INTRAVENOUS | Status: AC
  Administered 2020-03-22 (×3): 10 meq via INTRAVENOUS
  Filled 2020-03-22 (×3): qty 50

## 2020-03-22 MED ORDER — FREE WATER
200.0000 mL | Status: DC
  Administered 2020-03-22 – 2020-04-05 (×86): 200 mL

## 2020-03-22 MED ORDER — POTASSIUM CHLORIDE 10 MEQ/100ML IV SOLN
10.0000 meq | INTRAVENOUS | Status: AC
  Administered 2020-03-22 (×2): 10 meq via INTRAVENOUS
  Filled 2020-03-22 (×2): qty 100

## 2020-03-22 NOTE — Progress Notes (Signed)
[facility] Progress Note Patient Name: Tracy [name] DOB: 21-Feb-1951 MRN: [id number]   Date of Service  03/22/2020  HPI/Events of Note  K+ 3.0  eICU Interventions  KCL 10 meq iv Q 1 hour x 3 doses        Marita Burnsed U Ralonda Tartt 03/22/2020, 7:02 AM

## 2020-03-22 NOTE — Progress Notes (Signed)
NAME:  Tracy [name], MRN:  [id number], DOB:  April 07, 1951, LOS: 72 ADMISSION DATE:  03/03/2020, CONSULTATION DATE:  3/27  REFERRING MD:  Ninfa Linden, CHIEF COMPLAINT:  Abdominal pain   Brief History   69 y/o female admitted 3/21 with bowel perforation requiring colostomy and lower anterior resection in setting of newly diagnosed proximal rectal cancer with sigmoid perforation.  Post op developed septic shock, HCAP AKI ileus and acute hypoxemic respiratory failure requiring intubation.   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 Valeta Harms. Not on O2.  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, metastatic carcinoma in 3/15 lymph nodes  LE [name] 3/27 >> negative for DVT  ECHO 3/27 >> poor windows, normal LV function, severely dilated RV with moderately reduced function, RV volume and pressure overload, mildly dilated RA  V/Q Scan 3/30 >> low probability for PE  4/6 CT chest abdomen pelvis> 5.5x3.2 fluid collection along stomach, surgical drain again noted in pelvis, interval development of crescent shaped  fluid collection measuring 16.4 x 3.3 in epigastric region and left upper quadrant of abdomen (could be abscess) multiple other smaller fluid collections, colostomy noted, mild free fluid in posterior pelvis  Micro Data:  COVID 3/21 >> Negative MRSA PCR 3/21 >> Negative Blood cultures 3/27 >> negative  Urine culture 3/26 >> insignificant growth  Tracheal Aspirate 3/29 >> Saccharomyces cerevisiae Trach aspirate 4/2>>saccharomyces cerevisiae 4/7 abscess abdomen> pseudomonas 4/7 abscess abdomen > no growth   Antimicrobials:  Zosyn 3/21 >> 3/29 Anidulafungin 3/31 >> 4/5 Meropenem 3/29 >>   Interim history/subjective:   Na still up Failed SBT this morning K low  Objective   Blood pressure 104/64, pulse (!) 102, temperature 98.6 F (37 C), temperature source Axillary, resp. rate (!) 22, height 5\' 2"  (1.575 m), weight 71.8 kg, last menstrual period 12/14/2000, SpO2 92 %.    Vent Mode: PRVC FiO2 (%):  [30 %] 30 % Set Rate:  [10 bmp] 10 bmp Vt Set:  [400 mL] 400 mL PEEP:  [5 cmH20] 5 cmH20 Plateau Pressure:  [11 cmH20-24 cmH20] 11 cmH20   Intake/Output Summary (Last 24 hours) at 03/22/2020 0727 Last data filed at 03/22/2020 0646 Gross per 24 hour  Intake 2830.63 ml  Output 4170 ml  Net -1339.37 ml   Filed Weights   03/20/20 0500 03/21/20 0333 03/22/20 0427  Weight: 70.3 kg 71.1 kg 71.8 kg    Examination:  General:  In bed on vent HENT: NCAT tracheostomy in place PULM: CTA B, vent supported breathing CV: RRR, no mgr GI: ostomy in place, drains in place some bowel sounds,  MSK: normal  bulk and tone Neuro: awake   4/7 CXR images personally reviewed> emphysema bilaterally,tracheostomy  in place  Resolved Hospital Problem list     Assessment & Plan:  Acute hypoxemic respiratory failure> pressure support trial limited by anxiety/pain and severe lung disease, unclear if will be able to be liberated from ventialtor.  Baseline GOLD III/IV COPD;  Severe emphysema Needs to  wake up today Change RASS target to 0 D/c precedex Pressure support now VAP prevention  Bowel perforation/partial colectomy Colon Cancer Abdominal abscess> psuedomonas Malnutrition, moderate Per general surgery Continue tube feeding Continue meropenem, narrow based on susceptibilities Would treat with abx for at least 5 days since the drain was placed 4/7  Anasarca, improved 4/9 Hypernatremia > slightly up AKI > resolved Monitor BMET and UOP Replace electrolytes as needed Increase free water via gut Stop D5  Lasix again   Hypertension Monitor hemodynamics  Acute anemia, stable 4/4 without bleeding Monitor for bleeding Transfuse PRBC for Hgb < 7 gm/dL  Hyperglycemia SSI + glargine  Need for sedation for mechanical ventilation RASS goal 0 PAD protocol: discontinue precedex, prn versed, use prn dilaudid   Best practice:  Diet: TPN Pain/Anxiety/Delirium protocol (if indicated): yes, as above VAP protocol (if indicated): yes DVT prophylaxis: sub q heparin  GI prophylaxis: PPI Glucose control: as above Mobility: bed rest Code Status: full Family Communication: I updated Danny bedside 4/9 Disposition: remain in ICU  Labs   CBC: Recent Labs  Lab 03/15/20 1046 03/18/20 0441 03/19/20 0415 03/21/20 0327  WBC 17.7* 21.6* 18.4* 17.7*  NEUTROABS 16.3* 19.6*  --   --   HGB 9.7* 9.3* 8.8* 9.2*  HCT 29.0* 29.6* 28.9* 30.0*  MCV 93.2 96.7 99.0 101.4*  PLT 316 481* 450* [id number]    Basic Metabolic Panel: Recent Labs  Lab 03/18/20 0441 03/18/20 0441 03/19/20 0415 03/19/20 1147 03/20/20 0501 03/21/20 0327 03/22/20 0427  NA 150*   < > 150* 150* 137 147* 147*  K 4.7   < > 5.2* 4.7 3.9 4.6 3.0*  CL 110   < > 111 113* 101 113* 107  CO2 32   < > 31 30 27 27 29   GLUCOSE 168*   < > 173* 129* 442* 147* 147*  BUN 93*   < > 86* 83* 62* 61* 65*  CREATININE 0.89   < > 0.75 0.71 0.71 0.60 0.59  CALCIUM 9.3   < > 9.3 9.4 8.1* 8.3* 8.3*  MG 2.0  --  2.0  --  1.6* 2.3 1.9   PHOS 3.2  --  3.0  --  3.1 3.1 3.2   < > = values in this interval not displayed.   GFR: Estimated Creatinine Clearance: 64.2 mL/min (by C-G formula based on SCr of 0.59 mg/dL). Recent Labs  Lab 03/15/20 1046 03/18/20 0441 03/19/20 0415 03/21/20 0327  WBC 17.7* 21.6* 18.4* 17.7*    Liver Function Tests: Recent Labs  Lab 03/18/20 0441 03/19/20 0415 03/20/20 0501 03/21/20 0327 03/22/20 0427  AST 24 23 38 31 28  ALT 21 22 40 32 27  ALKPHOS 82 78 87 79 84  BILITOT 0.6 0.8 0.8 1.2 0.5  PROT 5.7* 5.3* 4.9* 4.8* 4.9*  ALBUMIN 3.3* 2.9* 2.7* 2.5* 2.4*   No results for input(s): LIPASE, AMYLASE in the last 168 hours. No results for input(s): AMMONIA in the last 168 hours.  ABG    Component Value Date/Time   PHART 7.398 03/16/2020 2245   PCO2ART 45.6 03/16/2020 2245   PO2ART  70.3 (L) 03/16/2020 2245   HCO3 27.7 03/16/2020 2245   TCO2 24 03/12/2020 1513   ACIDBASEDEF 4.8 (H) 03/12/2020 1738   O2SAT 93.5 03/16/2020 2245     Coagulation Profile: No results for input(s): INR, PROTIME in the last 168 hours.  Cardiac Enzymes: No results for input(s): CKTOTAL, CKMB, CKMBINDEX, TROPONINI in the last 168 hours.  HbA1C: Hgb A1c MFr Bld  Date/Time Value Ref Range Status  03/11/2020 05:00 AM 5.9 (H) 4.8 - 5.6 % Final    Comment:    (NOTE) Pre diabetes:          5.7%-6.4% Diabetes:              >6.4% Glycemic control for   <7.0% adults with diabetes     CBG: Recent Labs  Lab 03/21/20 1219 03/21/20 1538 03/21/20 1941 03/21/20 2349 03/22/20 0357  GLUCAP 203* 137* 166* 143* 138*     Critical care time: 40 minutes     Roselie Awkward, MD [facility] Pager: [phone] Cell: [phone] If no response, call [phone]

## 2020-03-22 NOTE — Progress Notes (Signed)
Referring Physician(s): Byerly,F  Supervising Physician: Jacqulynn Cadet  Patient Status:  [facility] - In-pt  Chief Complaint: Abdominal fluid collections   Subjective: Pt without acute changes; husband in room; opening eyes   Allergies: Codeine  Medications: Prior to Admission medications   Medication Sig Start Date End Date Taking? Authorizing Provider  acetaminophen (TYLENOL) 325 MG tablet Take 650 mg by mouth every 6 (six) hours as needed for moderate pain.   Yes [provider]  albuterol (PROVENTIL) (2.5 MG/3ML) 0.083% nebulizer solution INHALE 3ML VIA NEBULIZER EVERY 4 HOURS AS NEEDED FOR SHORTNESS OF BREATH 11/23/19  Yes Icard, Bradley L, DO  b complex vitamins tablet Take 1 tablet by mouth daily.   Yes [provider]  Fluticasone-Umeclidin-Vilant (TRELEGY ELLIPTA) 100-62.5-25 MCG/INH AEPB Inhale 1 puff into the lungs daily. 02/15/20  Yes Icard, Bradley L, DO  VITAMIN D, CHOLECALCIFEROL, PO Take 1 tablet by mouth daily.   Yes [provider]     Vital Signs: BP (!) 111/49   Pulse (!) 107   Temp 98.5 F (36.9 C) (Axillary)   Resp (!) 25   Ht 5\' 2"  (1.575 m)   Wt 158 lb 4.6 oz (71.8 kg)   LMP 12/14/2000   SpO2 97%   BMI 28.95 kg/m   Physical Exam trach/on vent;left abd drains intact, OP range from 30-55 cc yellow to thick bloody fluid; no issues with flushing per nurse  Imaging: DG Abd 1 View  Result Date: 03/21/2020 CLINICAL DATA:  Check gastric catheter placement EXAM: ABDOMEN - 1 VIEW COMPARISON:  03/09/2020 FINDINGS: Gastric catheter is noted with its tip in the stomach. The proximal side port lies in the distal esophagus. This should be advanced several cm further into the stomach. Multiple surgical drains are identified both in the pelvis as well as in the left mid abdomen. No free air is seen. No bony abnormality is noted. IMPRESSION: Gastric catheter as described.  This should be advanced several cm. Multiple drainage catheters  are identified. Electronically Signed   By: Inez Catalina M.D.   On: 03/21/2020 13:01   CT CHEST W CONTRAST  Result Date: 03/19/2020 CLINICAL DATA:  Abdominal abscess or infection. EXAM: CT CHEST, ABDOMEN, AND PELVIS WITH CONTRAST TECHNIQUE: Multidetector CT imaging of the chest, abdomen and pelvis was performed following the standard protocol during bolus administration of intravenous contrast. CONTRAST:  163mL OMNIPAQUE IOHEXOL 300 MG/ML  SOLN COMPARISON:  March 12, 2020. FINDINGS: CT CHEST FINDINGS Cardiovascular: No significant vascular findings. Normal heart size. No pericardial effusion. Mediastinum/Nodes: Endotracheal tube is in grossly good position. Thyroid gland is unremarkable. No adenopathy is noted. Nasogastric tube is seen passing through esophagus into stomach. Lungs/Pleura: No pneumothorax or pleural effusion is noted. Minimal right posterior basilar subsegmental atelectasis is noted. Musculoskeletal: No chest wall mass or suspicious bone lesions identified. CT ABDOMEN PELVIS FINDINGS Hepatobiliary: No focal liver abnormality is seen. No gallstones, gallbladder wall thickening, or biliary dilatation. Pancreas: Unremarkable. No pancreatic ductal dilatation or surrounding inflammatory changes. Spleen: Normal in size without focal abnormality. Adrenals/Urinary Tract: Adrenal glands are unremarkable. Kidneys are normal, without renal calculi, focal lesion, or hydronephrosis. Bladder is unremarkable. Stomach/Bowel: Nasogastric tube tip is seen in proximal stomach. 5.5 x 3.2 cm fluid collection is noted along the greater curvature of the proximal stomach. 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 No definite evidence of bowel obstruction is noted. Vascular/Lymphatic: Aortic atherosclerosis. No enlarged abdominal or pelvic lymph nodes. Reproductive: Uterus and bilateral adnexa are unremarkable.  Other: Mild amount of free fluid is noted in the posterior pelvis. Surgical drain is again noted in the  pelvis with tip in the left lower quadrant. There is interval development of crescent-shaped fluid collection measuring 16.4 x 3.3 cm in the epigastric region and left upper quadrant of the abdomen which may extend into the left lower quadrant. Potentially this may represent abscess or developing abscess. Multiple other smaller fluid collections are noted which may represent small abscesses. Moderate anasarca is noted. Musculoskeletal: No acute or significant osseous findings. IMPRESSION: 1. 5.5 x 3.2 cm fluid collection is noted along the greater curvature of the proximal stomach. 2. Surgical drain is again noted in the pelvis with tip in left lower quadrant. 3. Interval development of crescent-shaped fluid collection measuring 16.4 x 3.3 cm in the epigastric region and left upper quadrant of the abdomen which may extend into the left lower quadrant. Potentially this may represent abscess or developing abscess. Multiple other smaller fluid collections are noted which may represent small abscesses. 4. 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 5. Mild amount of free fluid is noted in the posterior pelvis. 6. Moderate anasarca is noted. Aortic Atherosclerosis (ICD10-I70.0). Electronically Signed   By: Marijo Conception M.D.   On: 03/19/2020 17:36   CT ABDOMEN PELVIS W CONTRAST  Result Date: 03/19/2020 CLINICAL DATA:  Abdominal abscess or infection. EXAM: CT CHEST, ABDOMEN, AND PELVIS WITH CONTRAST TECHNIQUE: Multidetector CT imaging of the chest, abdomen and pelvis was performed following the standard protocol during bolus administration of intravenous contrast. CONTRAST:  139mL OMNIPAQUE IOHEXOL 300 MG/ML  SOLN COMPARISON:  March 12, 2020. FINDINGS: CT CHEST FINDINGS Cardiovascular: No significant vascular findings. Normal heart size. No pericardial effusion. Mediastinum/Nodes: Endotracheal tube is in grossly good position. Thyroid gland is unremarkable. No adenopathy is noted. Nasogastric tube is seen passing  through esophagus into stomach. Lungs/Pleura: No pneumothorax or pleural effusion is noted. Minimal right posterior basilar subsegmental atelectasis is noted. Musculoskeletal: No chest wall mass or suspicious bone lesions identified. CT ABDOMEN PELVIS FINDINGS Hepatobiliary: No focal liver abnormality is seen. No gallstones, gallbladder wall thickening, or biliary dilatation. Pancreas: Unremarkable. No pancreatic ductal dilatation or surrounding inflammatory changes. Spleen: Normal in size without focal abnormality. Adrenals/Urinary Tract: Adrenal glands are unremarkable. Kidneys are normal, without renal calculi, focal lesion, or hydronephrosis. Bladder is unremarkable. Stomach/Bowel: Nasogastric tube tip is seen in proximal stomach. 5.5 x 3.2 cm fluid collection is noted along the greater curvature of the proximal stomach. 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 No definite evidence of bowel obstruction is noted. Vascular/Lymphatic: Aortic atherosclerosis. No enlarged abdominal or pelvic lymph nodes. Reproductive: Uterus and bilateral adnexa are unremarkable. Other: Mild amount of free fluid is noted in the posterior pelvis. Surgical drain is again noted in the pelvis with tip in the left lower quadrant. There is interval development of crescent-shaped fluid collection measuring 16.4 x 3.3 cm in the epigastric region and left upper quadrant of the abdomen which may extend into the left lower quadrant. Potentially this may represent abscess or developing abscess. Multiple other smaller fluid collections are noted which may represent small abscesses. Moderate anasarca is noted. Musculoskeletal: No acute or significant osseous findings. IMPRESSION: 1. 5.5 x 3.2 cm fluid collection is noted along the greater curvature of the proximal stomach. 2. Surgical drain is again noted in the pelvis with tip in left lower quadrant. 3. Interval development of crescent-shaped fluid collection measuring 16.4 x 3.3 cm in the  epigastric  region and left upper quadrant of the abdomen which may extend into the left lower quadrant. Potentially this may represent abscess or developing abscess. Multiple other smaller fluid collections are noted which may represent small abscesses. 4. 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 5. Mild amount of free fluid is noted in the posterior pelvis. 6. Moderate anasarca is noted. Aortic Atherosclerosis (ICD10-I70.0). Electronically Signed   By: Marijo Conception M.D.   On: 03/19/2020 17:36   DG Chest Port 1 View  Result Date: 03/22/2020 CLINICAL DATA:  Acute respiratory failure. EXAM: PORTABLE CHEST 1 VIEW COMPARISON:  March 20, 2020. FINDINGS: The heart size and mediastinal contours are within normal limits. Tracheostomy tube is in grossly good position. Nasogastric tube is seen entering stomach. Right-sided PICC line is unchanged in position. No pneumothorax or pleural effusion is noted. Extensive emphysematous disease is noted. No acute abnormality is noted. The visualized skeletal structures are unremarkable. IMPRESSION: Stable support apparatus. Stable extensive emphysematous disease. No acute cardiopulmonary abnormality seen. Emphysema (ICD10-J43.9). Electronically Signed   By: Marijo Conception M.D.   On: 03/22/2020 12:58   DG CHEST PORT 1 VIEW  Result Date: 03/20/2020 CLINICAL DATA:  Tracheostomy placement EXAM: PORTABLE CHEST 1 VIEW COMPARISON:  Chest radiograph from earlier today. FINDINGS: Right rotated chest radiograph. Tracheostomy tube tip overlies the tracheal air column just below thoracic inlet. Right PICC terminates over lower third of the SVC. Stable cardiomediastinal silhouette with normal heart size. No pneumothorax. No pleural effusion. Emphysema. No pulmonary edema. No acute consolidative airspace disease. IMPRESSION: 1. Well-positioned tracheostomy tube. 2. Emphysema. No acute cardiopulmonary disease. Electronically Signed   By: Ilona Sorrel M.D.   On: 03/20/2020 15:36   DG  Chest Port 1 View  Result Date: 03/20/2020 CLINICAL DATA:  Respiratory failure. EXAM: PORTABLE CHEST 1 VIEW COMPARISON:  03/19/2020 FINDINGS: The endotracheal tube is 4.5 cm above the carina. The NG tube is coursing down the esophagus and into the stomach. The left IJ central venous catheter has been removed. There is a new right-sided PICC line with its tip in good position in the distal SVC. Stable underlying emphysematous changes. Upper lobe pulmonary scarring. No acute infiltrates or effusions. IMPRESSION: 1. New right-sided PICC line in good position. 2. Removal of left IJ central venous catheter. 3. Stable ET tube and NG tube. 4. Underlying emphysematous changes but no acute pulmonary findings. Electronically Signed   By: Marijo Sanes M.D.   On: 03/20/2020 07:25   DG CHEST PORT 1 VIEW  Result Date: 03/19/2020 CLINICAL DATA:  Respiratory failure EXAM: PORTABLE CHEST 1 VIEW COMPARISON:  Three days ago FINDINGS: Endotracheal tube tip is halfway between the clavicular heads and carina. Left IJ line with tip at the SVC. The enteric tube at least reaches the stomach. Large lung volumes with lucency at the bases. No visible effusion or pneumothorax. No airspace disease. IMPRESSION: Stable hardware positioning and hyperinflated/lucent lung bases. Electronically Signed   By: Monte Fantasia M.D.   On: 03/19/2020 07:26   CT IMAGE GUIDED DRAINAGE BY PERCUTANEOUS CATHETER  Result Date: 03/20/2020 INDICATION: Postop abdominal fluid collections in the left upper quadrant and left lower quadrant EXAM: CT DRAINAGE LEFT UPPER QUADRANT FLUID COLLECTION CT DRAINAGE LEFT LOWER QUADRANT FLUID COLLECTION MEDICATIONS: The patient is currently admitted to the hospital and receiving intravenous antibiotics. The antibiotics were administered within an appropriate time frame prior to the initiation of the procedure. ANESTHESIA/SEDATION: Fentanyl 0 mcg IV; Versed 2 mg IV Moderate Sedation Time:  46 MINUTES The patient was  continuously monitored during the procedure by the interventional radiology nurse under my direct supervision. COMPLICATIONS: None immediate. PROCEDURE: Informed written consent was obtained from the patient after a thorough discussion of the procedural risks, benefits and alternatives. All questions were addressed. Maximal Sterile Barrier Technique was utilized including caps, mask, sterile gowns, sterile gloves, sterile drape, hand hygiene and skin antiseptic. A timeout was performed prior to the initiation of the procedure. Previous imaging reviewed. Patient positioned slightly left anterior oblique. Noncontrast localization CT performed. The left upper and lower quadrant fluid collections were localized and marked. CT drainage left lower quadrant fluid collection: Under sterile conditions and local anesthesia, an 18 gauge 15 cm access was advanced percutaneously into the fluid. Needle position confirmed with CT. Syringe aspiration yielded bloody exudative fluid. Sample sent for culture. Guidewire inserted followed by tract dilatation to insert a 14 [place] drain. Drain catheter position confirmed with CT. Syringe aspiration yielded 2 in 50 cc fluid. Catheter secured with Prolene suture and connected to external suction bulb. Sterile dressing applied. CT drainage left upper quadrant fluid collection: In a similar fashion, the left upper quadrant collection was localized and marked. Under sterile conditions and local anesthesia, an 18 gauge 15 cm access needle was advanced percutaneously into the fluid. Syringe aspiration yielded exudative fluid. Sample sent for culture. Guidewire inserted followed by tract dilatation to insert a 12 [place] drain. Drain catheter position confirmed with CT. Syringe aspiration yielded 230 cc exudative fluid. Sample sent for culture. Catheter secured with a Prolene suture and connected to external suction bulb. Sterile dressing applied. No immediate complication. Patient tolerated the  procedure well. IMPRESSION: Successful CT-guided drainage of the left upper quadrant and left lower quadrant fluid collections. Sample sent from both sites. Electronically Signed   By: Jerilynn Mages.  Shick M.D.   On: 03/20/2020 13:56   CT IMAGE GUIDED DRAINAGE PERCUT CATH  PERITONEAL RETROPERIT  Result Date: 03/20/2020 INDICATION: Postop abdominal fluid collections in the left upper quadrant and left lower quadrant EXAM: CT DRAINAGE LEFT UPPER QUADRANT FLUID COLLECTION CT DRAINAGE LEFT LOWER QUADRANT FLUID COLLECTION MEDICATIONS: The patient is currently admitted to the hospital and receiving intravenous antibiotics. The antibiotics were administered within an appropriate time frame prior to the initiation of the procedure. ANESTHESIA/SEDATION: Fentanyl 0 mcg IV; Versed 2 mg IV Moderate Sedation Time:  3 MINUTES The patient was continuously monitored during the procedure by the interventional radiology nurse under my direct supervision. COMPLICATIONS: None immediate. PROCEDURE: Informed written consent was obtained from the patient after a thorough discussion of the procedural risks, benefits and alternatives. All questions were addressed. Maximal Sterile Barrier Technique was utilized including caps, mask, sterile gowns, sterile gloves, sterile drape, hand hygiene and skin antiseptic. A timeout was performed prior to the initiation of the procedure. Previous imaging reviewed. Patient positioned slightly left anterior oblique. Noncontrast localization CT performed. The left upper and lower quadrant fluid collections were localized and marked. CT drainage left lower quadrant fluid collection: Under sterile conditions and local anesthesia, an 18 gauge 15 cm access was advanced percutaneously into the fluid. Needle position confirmed with CT. Syringe aspiration yielded bloody exudative fluid. Sample sent for culture. Guidewire inserted followed by tract dilatation to insert a 14 [place] drain. Drain catheter position confirmed  with CT. Syringe aspiration yielded 2 in 50 cc fluid. Catheter secured with Prolene suture and connected to external suction bulb. Sterile dressing applied. CT drainage left upper quadrant fluid collection: In a similar fashion, the left  upper quadrant collection was localized and marked. Under sterile conditions and local anesthesia, an 18 gauge 15 cm access needle was advanced percutaneously into the fluid. Syringe aspiration yielded exudative fluid. Sample sent for culture. Guidewire inserted followed by tract dilatation to insert a 12 [place] drain. Drain catheter position confirmed with CT. Syringe aspiration yielded 230 cc exudative fluid. Sample sent for culture. Catheter secured with a Prolene suture and connected to external suction bulb. Sterile dressing applied. No immediate complication. Patient tolerated the procedure well. IMPRESSION: Successful CT-guided drainage of the left upper quadrant and left lower quadrant fluid collections. Sample sent from both sites. Electronically Signed   By: Jerilynn Mages.  Shick M.D.   On: 03/20/2020 13:56   [name] EKG SITE RITE  Result Date: 03/19/2020 If Site Rite image not attached, placement could not be confirmed due to current cardiac rhythm.   Labs:  CBC: Recent Labs    03/15/20 1046 03/18/20 0441 03/19/20 0415 03/21/20 0327  WBC 17.7* 21.6* 18.4* 17.7*  HGB 9.7* 9.3* 8.8* 9.2*  HCT 29.0* 29.6* 28.9* 30.0*  PLT 316 481* 450* 390    COAGS: Recent Labs    03/09/20 1752  INR 1.3*    BMP: Recent Labs    03/19/20 1147 03/20/20 0501 03/21/20 0327 03/22/20 0427  NA 150* 137 147* 147*  K 4.7 3.9 4.6 3.0*  CL 113* 101 113* 107  CO2 30 27 27 29   GLUCOSE 129* 442* 147* 147*  BUN 83* 62* 61* 65*  CALCIUM 9.4 8.1* 8.3* 8.3*  CREATININE 0.71 0.71 0.60 0.59  GFRNONAA >60 >60 >60 >60  GFRAA >60 >60 >60 >60    LIVER FUNCTION TESTS: Recent Labs    03/19/20 0415 03/20/20 0501 03/21/20 0327 03/22/20 0427  BILITOT 0.8 0.8 1.2 0.5  AST 23 38 31 28   ALT 22 40 32 27  ALKPHOS 78 87 79 84  PROT 5.3* 4.9* 4.8* 4.9*  ALBUMIN 2.9* 2.7* 2.5* 2.4*    Assessment and Plan: Patient with history of perforated sigmoid colon, proximal rectal cancer with prior LAR/colon resection/colostomy; post op left abdominal fluid collections, status post drain placement x2 on 4/7;afebrile; drain fluid cx- pseudomonas; continue with drain irrigation, output monitoring, lab checks.  Once drain outputs are less than 10 to 15 cc/day for 2 to 3 days obtain follow-up CT.  Other plans per surgery/critical care   Electronically Signed: D. Rowe Robert, PA-C 03/22/2020, 5:28 PM   I spent a total of 15 minutes at the the patient's bedside AND on the patient's hospital floor or unit, greater than 50% of which was counseling/coordinating care for left abdominal fluid collection drains    Patient ID: Tracy [name], female   DOB: 01/06/1951, 69 y.o.   MRN: [id number]

## 2020-03-22 NOTE — Progress Notes (Signed)
PHARMACY - TOTAL PARENTERAL NUTRITION CONSULT NOTE   Indication: Prolonged ileus  Patient Measurements: Height: 5\' 2"  (157.5 cm) Weight: 71.8 kg (158 lb 4.6 oz) IBW/kg (Calculated) : 50.1   Body mass index is 28.95 kg/m.   Assessment:  53 yoF admitted on 3/21 bowel perforation s/p surgery on 3/21 and 3/31.  She remains intubated, on vasopressors, on antibiotic and antifungal coverage.  Tube feeds were given 3/27-3/29, then held.  OGT is still to suction, has bilious drainage.   Pharmacy is consulted to dose TPN.    Glucose / Insulin: A1c 5.9 - mSSI q4h (used 16 units from SSI in the past 24 hrs) - on solumedrol 40 mg daily--> reduced to 20 mg daily on 4/5 - d/c'd 4/7 - CBGs (goal <150): 137-166 - note that D5W currently at 50 ml/hr Electrolytes: Na remains at 147, K low 3 --> MD ordered KCL x3 runs; phos 3.2, Mag 1.9, CorrCa 9.58 -  Cl 107, CO2 29 Renal: SCr wnl, BUN 65 LFTs / TGs: LFTs WNL / TG up 163 Prealbumin / albumin: 13.8 / 3.3 Intake / Output; MIVF: last 24hrs, - 1.3L,Total net I/O of -198 for admission - D5W at 50 ml/hr Medications:  MVI, PPI -  Propofol started early 4/4 --> off at 0800 on 4/5 GI Imaging: Surgeries / Procedures:  3/21 emergent Low anterior resection/ colostomy  3/31 exploratory laparotomy and colon resection with transverse colostomy 4/3: trial of clamping OGT today per [facility] note 4/6: TPN d/ced d/t high K  Central access: CVC triple lumen (placed 3/27) TPN start date: 4/1  Nutritional Goals (per RD recommendation on 4/8): Kcal:  1420, Protein:  127-140 g, Fluid:  >/= 1.5 L/day Goal TPN rate is 80 mL/hr (124g protein, 1570kcal/day per new calculations 4/8) - Glucose infusion rate will be at 2.1 mg/kg/min with new  (Maximum 5 mg/kg/min)  Current Nutrition:  TPN + on 4/8 to start TFs at trickle of 10 ml/hr with no plans to advance for 1 to 2 days per CCS  Plan:  Now:  - give an additional 2 runs of KCL to get 5 runs total for today  -  Continue TPN at goal rate of 80 ml/hr  - per [facility]CS, continuing TF at 10 ml/hr for now - Electrolytes in TPN: remove Na, increase to 30 mEq/L of K, 50mEq/L of Ca, 63mEq/L of Mg, 10 mmol/L of Phos; 1:2 for Cl: Ac - Add standard MVI to TPN on MWF only  - Add standard trace elements to TPN daily - Continue SSI q4h moderate scale - Monitor TPN labs on Mon/Thurs and as needed   Dia Sitter, PharmD, BCPS 03/22/2020 7:45 AM

## 2020-03-22 NOTE — Progress Notes (Signed)
[facility] Progress Note  10 Days Post-Op  Subjective: CC-  More alert today. Monitoring off levophed this AM. Tube feedings running at 10cc/hr.  Objective: Vital signs in last 24 hours: Temp:  [98 F (36.7 C)-100.8 F (38.2 C)] 98.7 F (37.1 C) (04/09 0836) Pulse Rate:  [93-114] 93 (04/09 0800) Resp:  [20-29] 29 (04/09 0800) BP: (69-104)/(27-77) 75/27 (04/09 0800) SpO2:  [91 %-98 %] 96 % (04/09 0852) Arterial Line BP: (69-136)/(43-131) 100/55 (04/09 0800) FiO2 (%):  [30 %] 30 % (04/09 0852) Weight:  [71.8 kg] 71.8 kg (04/09 0427) Last BM Date: 03/21/20  Intake/Output from previous day: 04/08 0701 - 04/09 0700 In: 2830.6 [I.V.:2351.1; NG/GT:464.5] Out: [id number] [Urine:3800; Drains:345; Stool:25] Intake/Output this shift: No intake/output data recorded.  PE: Gen: Alert, NAD HEENT: trach in place Pulm:Mechanically ventilated [id number], mild distension, open midline incision light pink with trace slough/ no significant drainage, ostomy viable with small amount of air/loose stool in bag. Surgical drain serous. IR drain x2 - 1 is serous/cloudy and the other cloudy/red [id number] soft and nontender without edema Skin: warm and dry  Lab Results:  Recent Labs    03/21/20 0327  WBC 17.7*  HGB 9.2*  HCT 30.0*  PLT 390   BMET Recent Labs    03/21/20 0327 03/22/20 0427  NA 147* 147*  K 4.6 3.0*  CL 113* 107  CO2 27 29  GLUCOSE 147* 147*  BUN 61* 65*  CREATININE 0.60 0.59  CALCIUM 8.3* 8.3*   PT/INR No results for input(s): LABPROT, INR in the last 72 hours. CMP     Component Value Date/Time   NA 147 (H) 03/22/2020 0427   K 3.0 (L) 03/22/2020 0427   CL 107 03/22/2020 0427   CO2 29 03/22/2020 0427   GLUCOSE 147 (H) 03/22/2020 0427   BUN 65 (H) 03/22/2020 0427   CREATININE 0.59 03/22/2020 0427   CALCIUM 8.3 (L) 03/22/2020 0427   PROT 4.9 (L) 03/22/2020 0427   ALBUMIN 2.4 (L) 03/22/2020 0427   AST 28 03/22/2020 0427   ALT 27 03/22/2020 0427    ALKPHOS 84 03/22/2020 0427   BILITOT 0.5 03/22/2020 0427   GFRNONAA >60 03/22/2020 0427   GFRAA >60 03/22/2020 0427   Lipase     Component Value Date/Time   LIPASE 18 03/03/2020 1214       Studies/Results: DG Abd 1 View  Result Date: 03/21/2020 CLINICAL DATA:  Check gastric catheter placement EXAM: ABDOMEN - 1 VIEW COMPARISON:  03/09/2020 FINDINGS: Gastric catheter is noted with its tip in the stomach. The proximal side port lies in the distal esophagus. This should be advanced several cm further into the stomach. Multiple surgical drains are identified both in the pelvis as well as in the left mid abdomen. No free air is seen. No bony abnormality is noted. IMPRESSION: Gastric catheter as described.  This should be advanced several cm. Multiple drainage catheters are identified. Electronically Signed   By: Inez Catalina M.D.   On: 03/21/2020 13:01   DG CHEST PORT 1 VIEW  Result Date: 03/20/2020 CLINICAL DATA:  Tracheostomy placement EXAM: PORTABLE CHEST 1 VIEW COMPARISON:  Chest radiograph from earlier today. FINDINGS: Right rotated chest radiograph. Tracheostomy tube tip overlies the tracheal air column just below thoracic inlet. Right PICC terminates over lower third of the SVC. Stable cardiomediastinal silhouette with normal heart size. No pneumothorax. No pleural effusion. Emphysema. No pulmonary edema. No acute consolidative airspace disease. IMPRESSION: 1. Well-positioned tracheostomy tube. 2. Emphysema. No acute  cardiopulmonary disease. Electronically Signed   By: Ilona Sorrel M.D.   On: 03/20/2020 15:36   CT IMAGE GUIDED DRAINAGE BY PERCUTANEOUS CATHETER  Result Date: 03/20/2020 INDICATION: Postop abdominal fluid collections in the left upper quadrant and left lower quadrant EXAM: CT DRAINAGE LEFT UPPER QUADRANT FLUID COLLECTION CT DRAINAGE LEFT LOWER QUADRANT FLUID COLLECTION MEDICATIONS: The patient is currently admitted to the hospital and receiving intravenous antibiotics. The  antibiotics were administered within an appropriate time frame prior to the initiation of the procedure. ANESTHESIA/SEDATION: Fentanyl 0 mcg IV; Versed 2 mg IV Moderate Sedation Time:  73 MINUTES The patient was continuously monitored during the procedure by the interventional radiology nurse under my direct supervision. COMPLICATIONS: None immediate. PROCEDURE: Informed written consent was obtained from the patient after a thorough discussion of the procedural risks, benefits and alternatives. All questions were addressed. Maximal Sterile Barrier Technique was utilized including caps, mask, sterile gowns, sterile gloves, sterile drape, hand hygiene and skin antiseptic. A timeout was performed prior to the initiation of the procedure. Previous imaging reviewed. Patient positioned slightly left anterior oblique. Noncontrast localization CT performed. The left upper and lower quadrant fluid collections were localized and marked. CT drainage left lower quadrant fluid collection: Under sterile conditions and local anesthesia, an 18 gauge 15 cm access was advanced percutaneously into the fluid. Needle position confirmed with CT. Syringe aspiration yielded bloody exudative fluid. Sample sent for culture. Guidewire inserted followed by tract dilatation to insert a 14 [place] drain. Drain catheter position confirmed with CT. Syringe aspiration yielded 2 in 50 cc fluid. Catheter secured with Prolene suture and connected to external suction bulb. Sterile dressing applied. CT drainage left upper quadrant fluid collection: In a similar fashion, the left upper quadrant collection was localized and marked. Under sterile conditions and local anesthesia, an 18 gauge 15 cm access needle was advanced percutaneously into the fluid. Syringe aspiration yielded exudative fluid. Sample sent for culture. Guidewire inserted followed by tract dilatation to insert a 12 [place] drain. Drain catheter position confirmed with CT. Syringe aspiration  yielded 230 cc exudative fluid. Sample sent for culture. Catheter secured with a Prolene suture and connected to external suction bulb. Sterile dressing applied. No immediate complication. Patient tolerated the procedure well. IMPRESSION: Successful CT-guided drainage of the left upper quadrant and left lower quadrant fluid collections. Sample sent from both sites. Electronically Signed   By: Jerilynn Mages.  Shick M.D.   On: 03/20/2020 13:56   CT IMAGE GUIDED DRAINAGE PERCUT CATH  PERITONEAL RETROPERIT  Result Date: 03/20/2020 INDICATION: Postop abdominal fluid collections in the left upper quadrant and left lower quadrant EXAM: CT DRAINAGE LEFT UPPER QUADRANT FLUID COLLECTION CT DRAINAGE LEFT LOWER QUADRANT FLUID COLLECTION MEDICATIONS: The patient is currently admitted to the hospital and receiving intravenous antibiotics. The antibiotics were administered within an appropriate time frame prior to the initiation of the procedure. ANESTHESIA/SEDATION: Fentanyl 0 mcg IV; Versed 2 mg IV Moderate Sedation Time:  85 MINUTES The patient was continuously monitored during the procedure by the interventional radiology nurse under my direct supervision. COMPLICATIONS: None immediate. PROCEDURE: Informed written consent was obtained from the patient after a thorough discussion of the procedural risks, benefits and alternatives. All questions were addressed. Maximal Sterile Barrier Technique was utilized including caps, mask, sterile gowns, sterile gloves, sterile drape, hand hygiene and skin antiseptic. A timeout was performed prior to the initiation of the procedure. Previous imaging reviewed. Patient positioned slightly left anterior oblique. Noncontrast localization CT performed. The left upper and lower quadrant  fluid collections were localized and marked. CT drainage left lower quadrant fluid collection: Under sterile conditions and local anesthesia, an 18 gauge 15 cm access was advanced percutaneously into the fluid. Needle  position confirmed with CT. Syringe aspiration yielded bloody exudative fluid. Sample sent for culture. Guidewire inserted followed by tract dilatation to insert a 14 [place] drain. Drain catheter position confirmed with CT. Syringe aspiration yielded 2 in 50 cc fluid. Catheter secured with Prolene suture and connected to external suction bulb. Sterile dressing applied. CT drainage left upper quadrant fluid collection: In a similar fashion, the left upper quadrant collection was localized and marked. Under sterile conditions and local anesthesia, an 18 gauge 15 cm access needle was advanced percutaneously into the fluid. Syringe aspiration yielded exudative fluid. Sample sent for culture. Guidewire inserted followed by tract dilatation to insert a 12 [place] drain. Drain catheter position confirmed with CT. Syringe aspiration yielded 230 cc exudative fluid. Sample sent for culture. Catheter secured with a Prolene suture and connected to external suction bulb. Sterile dressing applied. No immediate complication. Patient tolerated the procedure well. IMPRESSION: Successful CT-guided drainage of the left upper quadrant and left lower quadrant fluid collections. Sample sent from both sites. Electronically Signed   By: Jerilynn Mages.  Shick M.D.   On: 03/20/2020 13:56    Anti-infectives: Anti-infectives (From admission, onward)   Start     Dose/Rate Route Frequency Ordered Stop   03/18/20 0900  meropenem (MERREM) 1 g in sodium chloride 0.9 % 100 mL IVPB     1 g 200 mL/hr over 30 Minutes Intravenous Every 8 hours 03/18/20 0838     03/15/20 2200  meropenem (MERREM) 1 g in sodium chloride 0.9 % 100 mL IVPB  Status:  Discontinued     1 g 200 mL/hr over 30 Minutes Intravenous Every 12 hours 03/15/20 1021 03/18/20 0838   03/14/20 1000  anidulafungin (ERAXIS) 100 mg in sodium chloride 0.9 % 100 mL IVPB  Status:  Discontinued     100 mg 78 mL/hr over 100 Minutes Intravenous Every 24 hours 03/13/20 0752 03/18/20 0849   03/13/20  0900  anidulafungin (ERAXIS) 200 mg in sodium chloride 0.9 % 200 mL IVPB     200 mg 78 mL/hr over 200 Minutes Intravenous  Once 03/13/20 0752 03/13/20 1241   03/11/20 2000  meropenem (MERREM) 500 mg in sodium chloride 0.9 % 100 mL IVPB  Status:  Discontinued     500 mg 200 mL/hr over 30 Minutes Intravenous Every 12 hours 03/11/20 1658 03/15/20 1021   03/11/20 1200  piperacillin-tazobactam (ZOSYN) IVPB 2.25 g  Status:  Discontinued     2.25 g 100 mL/hr over 30 Minutes Intravenous Every 6 hours 03/11/20 1058 03/11/20 1641   03/06/20 1200  piperacillin-tazobactam (ZOSYN) IVPB 3.375 g  Status:  Discontinued     3.375 g 12.5 mL/hr over 240 Minutes Intravenous Every 8 hours 03/06/20 0739 03/11/20 1058   03/05/20 1400  piperacillin-tazobactam (ZOSYN) IVPB 2.25 g  Status:  Discontinued     2.25 g 100 mL/hr over 30 Minutes Intravenous Every 8 hours 03/05/20 0752 03/06/20 0739   03/04/20 2200  piperacillin-tazobactam (ZOSYN) IVPB 3.375 g  Status:  Discontinued     3.375 g 12.5 mL/hr over 240 Minutes Intravenous Every 8 hours 03/03/20 2032 03/05/20 0752   03/03/20 1545  cefoTEtan (CEFOTAN) 2 g in sodium chloride 0.9 % 100 mL IVPB     2 g 200 mL/hr over 30 Minutes Intravenous On call to O.R. 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 Marcello Moores 03/03/20 POD#17 S/p exploratory laparotomy and colon resection with transverse colostomy 3/31 Dr. Hassell Done - POD#9 -BID wet to drydressing changesto midline abdominal wound - WOC  teamfollowing for ostomy teaching - JP drain serous - s/p IR drain x2 4/7, follow cultures - ostomy viable and having some bowel function  FEN -NPO, TPN, TF at 10cc/hr VTE - SCDs,SQ heparin ID - Zosyn3/21>3/29; Eraxis 3/31>>4/5,Merrem 3/29>> Foley -wick  Plan: Continue drains, IV abx, and follow cultures. Off pressor support this AM but she did require levo yesterday; keep tube feedings at 10cc/hr, do not advance past this yet. Continue full TPN.   LOS: 19 days    Wellington Hampshire, [facility] 03/22/2020, 9:04 AM Please see Amion for pager number during day hours 7:00am-4:30pm

## 2020-03-23 LAB — CBC WITH DIFFERENTIAL/PLATELET
Abs Immature Granulocytes: 0.16 10*3/uL — ABNORMAL HIGH (ref 0.00–0.07)
Basophils Absolute: 0 10*3/uL (ref 0.0–0.1)
Basophils Relative: 0 %
Eosinophils Absolute: 0 10*3/uL (ref 0.0–0.5)
Eosinophils Relative: 0 %
HCT: 27.4 % — ABNORMAL LOW (ref 36.0–46.0)
Hemoglobin: 8.3 g/dL — ABNORMAL LOW (ref 12.0–15.0)
Immature Granulocytes: 1 %
Lymphocytes Relative: 4 %
Lymphs Abs: 0.6 10*3/uL — ABNORMAL LOW (ref 0.7–4.0)
MCH: 30 pg (ref 26.0–34.0)
MCHC: 30.3 g/dL (ref 30.0–36.0)
MCV: 98.9 fL (ref 80.0–100.0)
Monocytes Absolute: 0.6 10*3/uL (ref 0.1–1.0)
Monocytes Relative: 4 %
Neutro Abs: 13.5 10*3/uL — ABNORMAL HIGH (ref 1.7–7.7)
Neutrophils Relative %: 91 %
Platelets: 334 10*3/uL (ref 150–400)
RBC: 2.77 MIL/uL — ABNORMAL LOW (ref 3.87–5.11)
RDW: 16.2 % — ABNORMAL HIGH (ref 11.5–15.5)
WBC: 14.9 10*3/uL — ABNORMAL HIGH (ref 4.0–10.5)
nRBC: 0 % (ref 0.0–0.2)

## 2020-03-23 LAB — COMPREHENSIVE METABOLIC PANEL
ALT: 36 U/L (ref 0–44)
AST: 44 U/L — ABNORMAL HIGH (ref 15–41)
Albumin: 2.3 g/dL — ABNORMAL LOW (ref 3.5–5.0)
Alkaline Phosphatase: 167 U/L — ABNORMAL HIGH (ref 38–126)
Anion gap: 12 (ref 5–15)
BUN: 64 mg/dL — ABNORMAL HIGH (ref 8–23)
CO2: 29 mmol/L (ref 22–32)
Calcium: 8.5 mg/dL — ABNORMAL LOW (ref 8.9–10.3)
Chloride: 101 mmol/L (ref 98–111)
Creatinine, Ser: 0.54 mg/dL (ref 0.44–1.00)
GFR calc Af Amer: >60 mL/min (ref >60)
GFR calc non Af Amer: >60 mL/min (ref >60)
Glucose, Bld: 153 mg/dL — ABNORMAL HIGH (ref 70–99)
Potassium: 2.6 mmol/L — CL (ref 3.5–5.1)
Sodium: 142 mmol/L (ref 135–145)
Total Bilirubin: 0.6 mg/dL (ref 0.3–1.2)
Total Protein: 5 g/dL — ABNORMAL LOW (ref 6.5–8.1)

## 2020-03-23 LAB — MAGNESIUM: Magnesium: 1.7 mg/dL (ref 1.7–2.4)

## 2020-03-23 LAB — GLUCOSE, CAPILLARY
Glucose-Capillary: 131 mg/dL — ABNORMAL HIGH (ref 70–99)
Glucose-Capillary: 138 mg/dL — ABNORMAL HIGH (ref 70–99)
Glucose-Capillary: 148 mg/dL — ABNORMAL HIGH (ref 70–99)
Glucose-Capillary: 149 mg/dL — ABNORMAL HIGH (ref 70–99)
Glucose-Capillary: 153 mg/dL — ABNORMAL HIGH (ref 70–99)
Glucose-Capillary: 170 mg/dL — ABNORMAL HIGH (ref 70–99)

## 2020-03-23 LAB — PHOSPHORUS: Phosphorus: 2.4 mg/dL — ABNORMAL LOW (ref 2.5–4.6)

## 2020-03-23 MED ORDER — POTASSIUM CHLORIDE 10 MEQ/100ML IV SOLN
10.0000 meq | INTRAVENOUS | Status: AC
  Administered 2020-03-23 (×6): 10 meq via INTRAVENOUS
  Filled 2020-03-23 (×6): qty 100

## 2020-03-23 MED ORDER — VITAL HIGH PROTEIN PO LIQD
1000.0000 mL | ORAL | Status: DC
  Administered 2020-03-23: 1000 mL

## 2020-03-23 MED ORDER — TRAVASOL 10 % IV SOLN
INTRAVENOUS | Status: DC
  Filled 2020-03-23: qty 1248

## 2020-03-23 MED ORDER — LABETALOL HCL 5 MG/ML IV SOLN: 5.0000 mg | INTRAVENOUS | Status: DC | PRN

## 2020-03-23 MED ORDER — POTASSIUM PHOSPHATES 15 MMOLE/5ML IV SOLN
10.0000 mmol | Freq: Once | INTRAVENOUS | Status: AC
  Administered 2020-03-23: 10 mmol via INTRAVENOUS
  Filled 2020-03-23: qty 3.33

## 2020-03-23 MED ORDER — MAGNESIUM SULFATE 2 GM/50ML IV SOLN
2.0000 g | Freq: Once | INTRAVENOUS | Status: AC
  Administered 2020-03-23: 09:00:00 2 g via INTRAVENOUS
  Filled 2020-03-23: qty 50

## 2020-03-23 MED ORDER — IPRATROPIUM-ALBUTEROL 0.5-2.5 (3) MG/3ML IN SOLN
3.0000 mL | RESPIRATORY_TRACT | Status: DC | PRN
  Administered 2020-03-30: 3 mL via RESPIRATORY_TRACT
  Filled 2020-03-23: qty 3

## 2020-03-23 NOTE — Progress Notes (Signed)
[facility] Progress Note  11 Days Post-Op  Subjective: CC-  Sedation off.  Working on Careers adviser.  Tolerated 10 ml /hr tube feeds for almost 48 hour.s    Objective: Vital signs in last 24 hours: Temp:  [98.4 F (36.9 C)-99.4 F (37.4 C)] 98.4 F (36.9 C) (04/10 0440) Pulse Rate:  [98-117] 98 (04/10 0803) Resp:  [14-32] 29 (04/10 0803) BP: (87-160)/(37-101) 111/85 (04/10 0803) SpO2:  [91 %-99 %] 92 % (04/10 0803) Arterial Line BP: (86-171)/(55-71) 171/63 (04/09 1200) FiO2 (%):  [30 %] 30 % (04/10 0822) Weight:  [70.1 kg] 70.1 kg (04/10 0500) Last BM Date: 03/22/20  Intake/Output from previous day: 04/09 0701 - 04/10 0700 In: 1634.2 [I.V.:1250.9; IV Piggyback:368.3] Out: 3360 [Urine:3180; Drains:170; Stool:10] Intake/Output this shift: No intake/output data recorded.  PE: Gen: Alert, NAD HEENT: trach in place Pulm:Mechanically ventilated, on PS.  Mild increased resp effort.   [id number], mild distension, open midline incision.  LUQ drain purulent, but low volume.  A little more clear than yesterday.  LLQ drain serosang, R drain serous.   [id number] soft and nontender without edema Skin: warm and dry  Lab Results:  Recent Labs    03/21/20 0327 03/23/20 0635  WBC 17.7* 14.9*  HGB 9.2* 8.3*  HCT 30.0* 27.4*  PLT 390 334   BMET Recent Labs    03/22/20 0427 03/23/20 0635  NA 147* 142  K 3.0* 2.6*  CL 107 101  CO2 29 29  GLUCOSE 147* 153*  BUN 65* 64*  CREATININE 0.59 0.54  CALCIUM 8.3* 8.5*   PT/INR No results for input(s): LABPROT, INR in the last 72 hours. CMP     Component Value Date/Time   NA 142 03/23/2020 0635   K 2.6 (LL) 03/23/2020 0635   CL 101 03/23/2020 0635   CO2 29 03/23/2020 0635   GLUCOSE 153 (H) 03/23/2020 0635   BUN 64 (H) 03/23/2020 0635   CREATININE 0.54 03/23/2020 0635   CALCIUM 8.5 (L) 03/23/2020 0635   PROT 5.0 (L) 03/23/2020 0635   ALBUMIN 2.3 (L) 03/23/2020 0635   AST 44 (H) 03/23/2020 0635   ALT 36  03/23/2020 0635   ALKPHOS 167 (H) 03/23/2020 0635   BILITOT 0.6 03/23/2020 0635   GFRNONAA >60 03/23/2020 0635   GFRAA >60 03/23/2020 0635   Lipase     Component Value Date/Time   LIPASE 18 03/03/2020 1214       Studies/Results: DG Abd 1 View  Result Date: 03/21/2020 CLINICAL DATA:  Check gastric catheter placement EXAM: ABDOMEN - 1 VIEW COMPARISON:  03/09/2020 FINDINGS: Gastric catheter is noted with its tip in the stomach. The proximal side port lies in the distal esophagus. This should be advanced several cm further into the stomach. Multiple surgical drains are identified both in the pelvis as well as in the left mid abdomen. No free air is seen. No bony abnormality is noted. IMPRESSION: Gastric catheter as described.  This should be advanced several cm. Multiple drainage catheters are identified. Electronically Signed   By: Inez Catalina M.D.   On: 03/21/2020 13:01   DG Chest Port 1 View  Result Date: 03/22/2020 CLINICAL DATA:  Acute respiratory failure. EXAM: PORTABLE CHEST 1 VIEW COMPARISON:  March 20, 2020. FINDINGS: The heart size and mediastinal contours are within normal limits. Tracheostomy tube is in grossly good position. Nasogastric tube is seen entering stomach. Right-sided PICC line is unchanged in position. No pneumothorax or pleural effusion is noted. Extensive emphysematous disease is noted.  No acute abnormality is noted. The visualized skeletal structures are unremarkable. IMPRESSION: Stable support apparatus. Stable extensive emphysematous disease. No acute cardiopulmonary abnormality seen. Emphysema (ICD10-J43.9). Electronically Signed   By: Marijo Conception M.D.   On: 03/22/2020 12:58    Anti-infectives: Anti-infectives (From admission, onward)   Start     Dose/Rate Route Frequency Ordered Stop   03/18/20 0900  meropenem (MERREM) 1 g in sodium chloride 0.9 % 100 mL IVPB     1 g 200 mL/hr over 30 Minutes Intravenous Every 8 hours 03/18/20 0838     03/15/20 2200   meropenem (MERREM) 1 g in sodium chloride 0.9 % 100 mL IVPB  Status:  Discontinued     1 g 200 mL/hr over 30 Minutes Intravenous Every 12 hours 03/15/20 1021 03/18/20 0838   03/14/20 1000  anidulafungin (ERAXIS) 100 mg in sodium chloride 0.9 % 100 mL IVPB  Status:  Discontinued     100 mg 78 mL/hr over 100 Minutes Intravenous Every 24 hours 03/13/20 0752 03/18/20 0849   03/13/20 0900  anidulafungin (ERAXIS) 200 mg in sodium chloride 0.9 % 200 mL IVPB     200 mg 78 mL/hr over 200 Minutes Intravenous  Once 03/13/20 0752 03/13/20 1241   03/11/20 2000  meropenem (MERREM) 500 mg in sodium chloride 0.9 % 100 mL IVPB  Status:  Discontinued     500 mg 200 mL/hr over 30 Minutes Intravenous Every 12 hours 03/11/20 1658 03/15/20 1021   03/11/20 1200  piperacillin-tazobactam (ZOSYN) IVPB 2.25 g  Status:  Discontinued     2.25 g 100 mL/hr over 30 Minutes Intravenous Every 6 hours 03/11/20 1058 03/11/20 1641   03/06/20 1200  piperacillin-tazobactam (ZOSYN) IVPB 3.375 g  Status:  Discontinued     3.375 g 12.5 mL/hr over 240 Minutes Intravenous Every 8 hours 03/06/20 0739 03/11/20 1058   03/05/20 1400  piperacillin-tazobactam (ZOSYN) IVPB 2.25 g  Status:  Discontinued     2.25 g 100 mL/hr over 30 Minutes Intravenous Every 8 hours 03/05/20 0752 03/06/20 0739   03/04/20 2200  piperacillin-tazobactam (ZOSYN) IVPB 3.375 g  Status:  Discontinued     3.375 g 12.5 mL/hr over 240 Minutes Intravenous Every 8 hours 03/03/20 2032 03/05/20 0752   03/03/20 1545  cefoTEtan (CEFOTAN) 2 g in sodium chloride 0.9 % 100 mL IVPB     2 g 200 mL/hr over 30 Minutes Intravenous On call to O.R. 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 Marcello Moores 03/03/20 POD#17 S/p exploratory laparotomy and colon resection with transverse colostomy 3/31 Dr. Hassell Done - POD#10 -BID wet to drydressing changesto midline abdominal wound - WOC teamfollowing for ostomy teaching - s/p IR drain x2 4/7, follow cultures, pseudomonas present.   - ostomy viable and having some bowel function  FEN -NPO, TPN, TF at 10cc/hr VTE - SCDs,SQ heparin ID - Zosyn3/21>3/29; Eraxis 3/31>>4/5,Merrem 3/29>> Foley -wick  Plan: Continue drains, IV abx.  Increase tube feeds slowly.      LOS: 20 days    Milus Height, MD FACS  Surgical Oncology, General Surgery, Trauma and [place] [facility], [place] for weekday/non holidays Check [email or web address] for coverage night/weekend/holidays  Do not use SecureChat as it is not reliable for timely patient care.

## 2020-03-23 NOTE — Progress Notes (Signed)
CRITICAL VALUE ALERT  Critical Value:  K 2.6  Date & Time Notied:  0805  Provider Notified: Dr. Halford Chessman  Orders Received/Actions taken: 6 runs of K ordered; RN will continue to monitor pt.

## 2020-03-23 NOTE — Progress Notes (Signed)
PHARMACY - TOTAL PARENTERAL NUTRITION CONSULT NOTE   Indication: Prolonged ileus  Patient Measurements: Height: _0  (157.5 cm) Weight: 70.1 kg (154 lb 8.7 oz) IBW/kg (Calculated) : 50.1   Body mass index is 28.27 kg/m.   Assessment:  35 yoF admitted on 3/21 bowel perforation s/p surgery on 3/21 and 3/31.  She remains intubated, on vasopressors, on antibiotic and antifungal coverage.  Tube feeds were given 3/27-3/29, then held.  OGT is still to suction, has bilious drainage.   Pharmacy is consulted to dose TPN.    Glucose / Insulin: A1c 5.9 - mSSI q4h (used 16 units from SSI in the past 24 hrs) - on solumedrol 40 mg daily--> reduced to 20 mg daily on 4/5 - d/c'd 4/7 - CBGs (goal <150): 136-170 Electrolytes: Na down 142 (with Na removed from TPN, free water added on 4/9), K low 2.6; phos down 2.4, Mag down 1.7, CorrCa 9.86 -  Cl 101, CO2 29 Renal: SCr wnl, BUN 64 LFTs / TGs: LFTs WNL / TG up 163; Alk phoa up 167 Prealbumin / albumin: 13.8 / 3.3 Intake / Output; MIVF: last 24hrs, - 1.7L,Total net I/O of -1L for admission - D5W at 50 ml/hr d/ced on 4/9  - free water 200 ml q4h Medications:  MVI, PPI -  Propofol started early 4/4 --> off at 0800 on 4/5 GI Imaging: Surgeries / Procedures:  3/21 emergent Low anterior resection/ colostomy  3/31 exploratory laparotomy and colon resection with transverse colostomy 4/3: trial of clamping OGT today per CCS note 4/6: TPN d/ced d/t high K  Central access: CVC triple lumen (placed 3/27) TPN start date: 4/1  Nutritional Goals (per RD recommendation on 4/8): Kcal:  1420, Protein:  127-140 g, Fluid:  >/= 1.5 L/day Goal TPN rate is 80 mL/hr (124g protein, 1570kcal/day per new calculations 4/8) - Glucose infusion rate will be at 2.1 mg/kg/min with new  (Maximum 5 mg/kg/min)  Current Nutrition:  - TPN  -  4/8:  start TFs at trickle of 10 ml/hr with no plans to advance for 1 to 2 days per CCS - 4/10: Plan to adv to 30 ml/hr this evening  if patient tolerates. D/W with Dr. Johney Maine, he said to continue patient on full TPN for now and will plan on weaning TPN if she's able to tolerate TF  Plan:  Now:  -  KCL 10 meq x 6 runs  - magnesium sulfate 2gm IV x1 - potassium phosphate IV 10 mmol x1  At 1800: - Continue TPN at goal rate of 80 ml/hr  - per CCS, advancing TF to 30 ml/hr later today if pt tolerates - Electrolytes in TPN: Na 30 meq/L, increase to 50 mEq/L of K, 79mq/L of Ca, 523m/L of Mg, 15 mmol/L of Phos; 1:1 for Cl: Ac - Add standard MVI to TPN on MWF only  - Add standard trace elements to TPN daily - Continue SSI q4h moderate scale - Monitor TPN labs on Mon/Thurs and as needed - cmet , phos and mag on 4/11   AnDia SitterPharmD, BCPS 03/23/2020 8:05 AM

## 2020-03-23 NOTE — Progress Notes (Signed)
NAME:  Tracy [name], MRN:  [id number], DOB:  1951-04-14, LOS: 66 ADMISSION DATE:  03/03/2020, CONSULTATION DATE:  3/27  REFERRING MD:  Ninfa Linden, CHIEF COMPLAINT:  Abdominal pain   Brief History   69 y/o female admitted 3/21 with bowel perforation requiring colostomy and lower anterior resection in setting of newly diagnosed proximal rectal cancer with sigmoid perforation.  Post op developed septic shock, HCAP AKI ileus and acute hypoxemic respiratory failure requiring intubation.   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 Valeta Harms. Not on O2.  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, metastatic carcinoma in 3/15 lymph nodes  LE [name] 3/27 >> negative for DVT  ECHO 3/27 >> poor windows, normal LV function, severely dilated RV with moderately reduced function, RV volume and pressure overload, mildly dilated RA  V/Q Scan 3/30 >> low probability for PE  4/6 CT chest abdomen pelvis> 5.5x3.2 fluid collection along stomach, surgical drain again noted in pelvis, interval development of crescent shaped fluid collection measuring 16.4 x 3.3 in epigastric region and left upper quadrant of  abdomen (could be abscess) multiple other smaller fluid collections, colostomy noted, mild free fluid in posterior pelvis  Micro Data:  COVID 3/21 >> Negative MRSA PCR 3/21 >> Negative Blood cultures 3/27 >> negative  Tracheal Aspirate 3/29 >> Saccharomyces cerevisiae Trach aspirate 4/2 >> saccharomyces cerevisiae abscess abdomen 4/7 >> pseudomonas  Antimicrobials:  Zosyn 3/21 >> 3/29 Anidulafungin 3/31 >> 4/5 Meropenem 3/29 >>   Interim history/subjective:  Pressure support.  Objective   Blood pressure 111/85, pulse 98, temperature 98.4 F (36.9 C), temperature source Oral, resp. rate (!) 29, height 5\' 2"  (1.575 m), weight 70.1 kg, last menstrual period 12/14/2000, SpO2 92 %.    Vent Mode: PSV;CPAP FiO2 (%):  [30 %] 30 % Set Rate:  [10 bmp] 10 bmp Vt Set:  [400 mL] 400 mL PEEP:  [5 cmH20] 5 cmH20 Pressure Support:  [10 cmH20] 10 cmH20 Plateau Pressure:  [13 cmH20-17 cmH20] 13 cmH20   Intake/Output Summary (Last 24 hours) at 03/23/2020 0919 Last data filed at 03/23/2020 0650 Gross per 24 hour  Intake 1634.2 ml  Output 3360 ml  Net -1725.8 ml   Filed Weights   03/21/20 0333 03/22/20 0427 03/23/20 0500  Weight: 71.1 kg 71.8 kg 70.1 kg    Examination:  General - alert Eyes - pupils reactive ENT - trach site clean Cardiac - regular rate/rhythm, no murmur Chest - decreased BS, no wheeze Abdomen - colostomy in place Extremities - no cyanosis, clubbing, or edema Skin - no rashes Neuro - RASS 0   Resolved Hospital Problem list   AKI from ATN in setting of  sepsis, Hypernatremia  Assessment & Plan:   Acute hypoxic respiratory failure. Failure to wean s/p tracheostomy. Severe COPD with emphysema. - pressure support as able - f/u CXR intermittently - scheduled pulmicort, brovana, prn duoneb  Bowel perforation/partial colectomy. Colon cancer. Moderate malnutrition. - post op care, nutrition per CCS  Abdominal abscess with Pseudomonas. - drain placed 4/07 -  day 13/15 of Abx, currently on meropenem  Hypokalemia, hypophosphatemia, hypomagnesemia. - replacing electrolytes  Hypertension. - goal SBP < 140, DBP < 105  Anemia of critical illness. - f/u CBC - transfuse for Hb < 7 or significant bleeding  Hyperglycemia. - SSI   Best practice:  Diet: TPN DVT prophylaxis: sub q heparin  GI prophylaxis: protonix Mobility: bed rest Code Status: full Family Communication: updated husband at bedside Disposition: ICU  Labs    CMP Latest Ref Rng & Units 03/23/2020 03/22/2020 03/21/2020  Glucose 70 - 99 mg/dL 153(H) 147(H) 147(H)  BUN 8 - 23 mg/dL 64(H) 65(H) 61(H)  Creatinine 0.44 - 1.00 mg/dL 0.54 0.59 0.60  Sodium 135 - 145 mmol/L 142 147(H) 147(H)  Potassium 3.5 - 5.1 mmol/L 2.6(LL) 3.0(L) 4.6  Chloride 98 - 111 mmol/L 101 107 113(H)  CO2 22 - 32 mmol/L 29 29 27   Calcium 8.9 - 10.3 mg/dL 8.5(L) 8.3(L) 8.3(L)  Total Protein 6.5 - 8.1 g/dL 5.0(L) 4.9(L) 4.8(L)  Total Bilirubin 0.3 - 1.2 mg/dL 0.6 0.5 1.2  Alkaline Phos 38 - 126 U/L 167(H) 84 79  AST 15 - 41 U/L 44(H) 28 31  ALT 0 - 44 U/L 36 27 32    CBC Latest Ref Rng & Units 03/23/2020 03/21/2020 03/19/2020  WBC 4.0 - 10.5 K/uL 14.9(H) 17.7(H) 18.4(H)  Hemoglobin 12.0 - 15.0 g/dL 8.3(L) 9.2(L) 8.8(L)  Hematocrit 36.0 - 46.0 % 27.4(L) 30.0(L) 28.9(L)  Platelets 150 - 400 K/uL 334 390 450(H)    ABG    Component Value Date/Time   PHART 7.398 03/16/2020 2245   PCO2ART 45.6 03/16/2020 2245   PO2ART 70.3 (L) 03/16/2020 2245   HCO3 27.7 03/16/2020 2245   TCO2 24 03/12/2020 1513   ACIDBASEDEF 4.8 (H) 03/12/2020 1738   O2SAT 93.5 03/16/2020 2245    CBG (last 3)  Recent Labs    03/22/20 2318 03/23/20 0437 03/23/20 0759  GLUCAP 152* 153* 170*    Signature:  Chesley Mires, MD [place] Pager - [phone] 03/23/2020, 9:28 AM

## 2020-03-24 ENCOUNTER — Inpatient Hospital Stay (HOSPITAL_COMMUNITY): Payer: Medicare HMO

## 2020-03-24 LAB — PHOSPHORUS: Phosphorus: 2.4 mg/dL — ABNORMAL LOW (ref 2.5–4.6)

## 2020-03-24 LAB — GLUCOSE, CAPILLARY
Glucose-Capillary: 142 mg/dL — ABNORMAL HIGH (ref 70–99)
Glucose-Capillary: 133 mg/dL — ABNORMAL HIGH (ref 70–99)
Glucose-Capillary: 131 mg/dL — ABNORMAL HIGH (ref 70–99)
Glucose-Capillary: 142 mg/dL — ABNORMAL HIGH (ref 70–99)
Glucose-Capillary: 118 mg/dL — ABNORMAL HIGH (ref 70–99)

## 2020-03-24 LAB — COMPREHENSIVE METABOLIC PANEL
ALT: 45 U/L — ABNORMAL HIGH (ref 0–44)
AST: 55 U/L — ABNORMAL HIGH (ref 15–41)
Albumin: 2.2 g/dL — ABNORMAL LOW (ref 3.5–5.0)
Alkaline Phosphatase: 212 U/L — ABNORMAL HIGH (ref 38–126)
Anion gap: 6 (ref 5–15)
BUN: 54 mg/dL — ABNORMAL HIGH (ref 8–23)
CO2: 28 mmol/L (ref 22–32)
Calcium: 8.2 mg/dL — ABNORMAL LOW (ref 8.9–10.3)
Chloride: 106 mmol/L (ref 98–111)
Creatinine, Ser: 0.49 mg/dL (ref 0.44–1.00)
GFR calc Af Amer: >60 mL/min (ref >60)
GFR calc non Af Amer: >60 mL/min (ref >60)
Glucose, Bld: 135 mg/dL — ABNORMAL HIGH (ref 70–99)
Potassium: 3.8 mmol/L (ref 3.5–5.1)
Sodium: 140 mmol/L (ref 135–145)
Total Bilirubin: 0.6 mg/dL (ref 0.3–1.2)
Total Protein: 5.2 g/dL — ABNORMAL LOW (ref 6.5–8.1)

## 2020-03-24 LAB — MAGNESIUM: Magnesium: 2 mg/dL (ref 1.7–2.4)

## 2020-03-24 MED ORDER — TRAVASOL 10 % IV SOLN
INTRAVENOUS | Status: AC
  Filled 2020-03-24: qty 1248

## 2020-03-24 MED ORDER — POTASSIUM PHOSPHATES 15 MMOLE/5ML IV SOLN
10.0000 mmol | Freq: Once | INTRAVENOUS | Status: AC
  Administered 2020-03-24: 10 mmol via INTRAVENOUS
  Filled 2020-03-24: qty 3.33

## 2020-03-24 MED ORDER — VITAL HIGH PROTEIN PO LIQD
1000.0000 mL | ORAL | Status: DC
  Administered 2020-03-24 – 2020-03-25 (×2): 1000 mL

## 2020-03-24 NOTE — Progress Notes (Signed)
NAME:  Tracy [name], MRN:  [id number], DOB:  [birth year], LOS: 21 ADMISSION DATE:  03/03/2020, CONSULTATION DATE:  3/27  REFERRING MD:  Ninfa Linden, CHIEF COMPLAINT:  Abdominal pain   Brief History   69 y/o female admitted 3/21 with bowel perforation requiring colostomy and lower anterior resection in setting of newly diagnosed proximal rectal cancer with sigmoid perforation.  Post op developed septic shock, HCAP AKI ileus and acute hypoxemic respiratory failure requiring intubation.   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 Valeta Harms. Not on O2.  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, metastatic carcinoma in 3/15 lymph nodes  LE [name] 3/27 >> negative for DVT  ECHO 3/27 >> poor windows, Tracy LV function, severely dilated RV with moderately reduced function, RV volume and pressure overload, mildly dilated RA  V/Q Scan 3/30 >> low probability for PE  4/6 CT chest abdomen pelvis> 5.5x3.2 fluid collection along stomach, surgical drain again noted in pelvis, interval development of crescent shaped fluid collection measuring 16.4 x 3.3 in epigastric region and left upper quadrant of  abdomen (could be abscess) multiple other smaller fluid collections, colostomy noted, mild free fluid in posterior pelvis  Micro Data:  COVID 3/21 >> Negative MRSA PCR 3/21 >> Negative Blood cultures 3/27 >> negative  Tracheal Aspirate 3/29 >> Saccharomyces cerevisiae Trach aspirate 4/2 >> saccharomyces cerevisiae abscess abdomen 4/7 >> pseudomonas  Antimicrobials:  Zosyn 3/21 >> 3/29 Anidulafungin 3/31 >> 4/5 Meropenem 3/29 >>   Interim history/subjective:  Did several hours of pressure support yesterday.  Transitioned to TC this AM, but has more labored breathing.  Objective   Blood pressure (!) 142/70, pulse 93, temperature 98.7 F (37.1 C), temperature source Axillary, resp. rate (!) 36, height 5\' 2"  (1.575 m), weight 70 kg, last menstrual period 12/14/2000, SpO2 90 %.    Vent Mode: PRVC FiO2 (%):  [30 %] 30 % Set Rate:  [10 bmp] 10 bmp Vt Set:  [400 mL] 400 mL PEEP:  [5 cmH20] 5 cmH20 Pressure Support:  [10 cmH20] 10 cmH20 Plateau Pressure:  [12 cmH20-21 cmH20] 21 cmH20   Intake/Output Summary (Last 24 hours) at 03/24/2020 1035 Last data filed at 03/24/2020 1025 Gross per 24 hour  Intake 1852.75 ml  Output 2655 ml  Net -802.25 ml   Filed Weights   03/22/20 0427 03/23/20 0500 03/24/20 0442  Weight: 71.8 kg 70.1 kg 70 kg    Examination:  General - alert Eyes - pupils reactive ENT - trach site clean Cardiac - regular rate/rhythm, no murmur Chest - equal breath sounds b/l, no wheezing or rales Abdomen - soft, colostomy in place Extremities - no cyanosis, clubbing, or edema  Skin - no rashes Neuro - RASS 0   Resolved Hospital Problem list   AKI from ATN in setting of sepsis, Hypernatremia  Assessment & Plan:   Acute hypoxic respiratory failure. Failure to wean s/p tracheostomy. Severe COPD with emphysema. - pressure support to TC as able - suspect she will need LTAC - f/u CXR 4/12 - continue pulmicort, brovana - prn duoneb   Bowel perforation/partial  colectomy. Colon cancer. Moderate malnutrition. - post op care, nutrition per CCS  Abdominal abscess with Pseudomonas. - drain placed 4/07 - day 14/15 of ABx, currently on meropenem  Hypokalemia, hypophosphatemia, hypomagnesemia. - f/u labs  Hypertension. - goal SBP < 140, DBP < 105  Anemia of critical illness. - f/u CBC - transfuse for Hb < 7 or significant bleeding  Hyperglycemia. - SSI  Best practice:  Diet: TPN DVT prophylaxis: sub q heparin  GI prophylaxis: protonix Mobility: bed rest Code Status: full Family Communication: updated husband at bedside Disposition: ICU  Labs    CMP Latest Ref Rng & Units 03/24/2020 03/23/2020 03/22/2020  Glucose 70 - 99 mg/dL 135(H) 153(H) 147(H)  BUN 8 - 23 mg/dL 54(H) 64(H) 65(H)  Creatinine 0.44 - 1.00 mg/dL 0.49 0.54 0.59  Sodium 135 - 145 mmol/L 140 142 147(H)  Potassium 3.5 - 5.1 mmol/L 3.8 2.6(LL) 3.0(L)  Chloride 98 - 111 mmol/L 106 101 107  CO2 22 - 32 mmol/L 28 29 29   Calcium 8.9 - 10.3 mg/dL 8.2(L) 8.5(L) 8.3(L)  Total Protein 6.5 - 8.1 g/dL 5.2(L) 5.0(L) 4.9(L)  Total Bilirubin 0.3 - 1.2 mg/dL 0.6 0.6 0.5  Alkaline Phos 38 - 126 U/L 212(H) 167(H) 84  AST 15 - 41 U/L 55(H) 44(H) 28  ALT 0 - 44 U/L 45(H) 36 27    CBC Latest Ref Rng & Units 03/23/2020 03/21/2020 03/19/2020  WBC 4.0 - 10.5 K/uL 14.9(H) 17.7(H) 18.4(H)  Hemoglobin 12.0 - 15.0 g/dL 8.3(L) 9.2(L) 8.8(L)  Hematocrit 36.0 - 46.0 % 27.4(L) 30.0(L) 28.9(L)  Platelets 150 - 400 K/uL 334 390 450(H)    ABG    Component Value Date/Time   PHART 7.398 03/16/2020 2245   PCO2ART 45.6 03/16/2020 2245   PO2ART 70.3 (L) 03/16/2020 2245   HCO3 27.7 03/16/2020 2245   TCO2 24 03/12/2020 1513   ACIDBASEDEF 4.8 (H) 03/12/2020 1738   O2SAT 93.5 03/16/2020 2245    CBG (last 3)  Recent Labs    03/23/20 2341 03/24/20 0338 03/24/20 0809  GLUCAP 131* 142* 133*    Signature:  Chesley Mires, MD [place] Pager - [phone] 03/24/2020, 10:35  AM

## 2020-03-24 NOTE — Progress Notes (Signed)
Pharmacy Antibiotic Note  Tracy [name] is a 69 y.o. female with newly dx rectal cancer and  perforated sigmoid colon (s/p anterior resection/colostomy on 3/21 and colon resection with transverse colostomy on 3/31) currently on meropenem for intra-abdominal infection.  - 4/7: epigastric fluid collection drain placed  Today, 03/24/2020: - day #20 total of abx; day #14 meropenem  - Tmax 99.1, wbc 14.9 on 4/10 - scr down 0.49 (crcl~63) - 4/7 left abd drain: few PsA (pans sens) FINAL - CCM plans to treat for 15 days with meropenem  Plan: - continue meropenem to 1gm IV q8h  ________________________________  Height: 5\' 2"  (157.5 cm) Weight: 70 kg (154 lb 5.2 oz) IBW/kg (Calculated) : 50.1  Temp (24hrs), Avg:98.7 F (37.1 C), Min:98.2 F (36.8 C), Max:99.1 F (37.3 C)  Recent Labs  Lab 03/18/20 0441 03/18/20 0441 03/19/20 0415 03/19/20 1147 03/20/20 0501 03/21/20 0327 03/22/20 0427 03/23/20 0635 03/24/20 0430  WBC 21.6*  --  18.4*  --   --  17.7*  --  14.9*  --   CREATININE 0.89   < > 0.75   < > 0.71 0.60 0.59 0.54 0.49   < > = values in this interval not displayed.    Estimated Creatinine Clearance: 63.4 mL/min (by C-G formula based on SCr of 0.49 mg/dL).    Allergies  Allergen Reactions  . Codeine Swelling   Antimicrobials this admission:  3/21 Cefotetan x 1 3/22 Zosyn >> 3/29 3/29 Meropenem >>  3/31 Eraxis >> 4/5  Dose adjustments this admission:  3/23 ZEI > 2.25 q8 3/24 2.25 > ZEI 4/2 changed meropenem from 500mg  q12 to 1g q12 due to improved SCr  Microbiology results:  3/21 Influenza: neg; Covid: neg 3/21 MRSA PCR neg 3/26 UCx: < 10k colonies 3/27 BC: neg FINAL 3/29 TA: few yeast, few saccharomyces cerevisiae FINAL 4/2 TA: few saccharomyces cerevisiae FINAL 4/7 left abd drain: few PsA (pans sens) FINAL  Thank you for allowing pharmacy to be a part of this patient's care.  Lynelle Doctor 03/24/2020 7:16 AM

## 2020-03-24 NOTE — Progress Notes (Signed)
[facility] Progress Note  12 Days Post-Op  Subjective: CC-  Did a bit of trach collar yesterday.  No acute events.  Tolerated increase on tube feeds.     Objective: Vital signs in last 24 hours: Temp:  [98.2 F (36.8 C)-99.1 F (37.3 C)] 98.7 F (37.1 C) (04/11 0820) Pulse Rate:  [93-100] 93 (04/11 0944) Resp:  [18-36] 36 (04/11 0944) BP: (85-142)/(62-97) 142/70 (04/11 0700) SpO2:  [83 %-99 %] 90 % (04/11 0944) FiO2 (%):  [30 %] 30 % (04/11 0923) Weight:  [70 kg] 70 kg (04/11 0442) Last BM Date: 03/23/20  Intake/Output from previous day: 04/10 0701 - 04/11 0700 In: 2165.1 [I.V.:1308.3; IV Piggyback:841.8] Out: 2655 [Urine:2175; Drains:180; Stool:300] Intake/Output this shift: No intake/output data recorded.  PE: Gen: Alert, NAD.  Able to shake head/nod today to simple questions.   HEENT: trach in place Pulm:sl increase in RR.   [id number], minimal distension, open midline incision.  LUQ drain purulent.  LLQ drain serosang, R drain serous. [id number] soft and nontender without edema Skin: warm and dry  Lab Results:  Recent Labs    03/23/20 0635  WBC 14.9*  HGB 8.3*  HCT 27.4*  PLT 334   BMET Recent Labs    03/23/20 0635 03/24/20 0430  NA 142 140  K 2.6* 3.8  CL 101 106  CO2 29 28  GLUCOSE 153* 135*  BUN 64* 54*  CREATININE 0.54 0.49  CALCIUM 8.5* 8.2*   PT/INR No results for input(s): LABPROT, INR in the last 72 hours. CMP     Component Value Date/Time   NA 140 03/24/2020 0430   K 3.8 03/24/2020 0430   CL 106 03/24/2020 0430   CO2 28 03/24/2020 0430   GLUCOSE 135 (H) 03/24/2020 0430   BUN 54 (H) 03/24/2020 0430   CREATININE 0.49 03/24/2020 0430   CALCIUM 8.2 (L) 03/24/2020 0430   PROT 5.2 (L) 03/24/2020 0430   ALBUMIN 2.2 (L) 03/24/2020 0430   AST 55 (H) 03/24/2020 0430   ALT 45 (H) 03/24/2020 0430   ALKPHOS 212 (H) 03/24/2020 0430   BILITOT 0.6 03/24/2020 0430   GFRNONAA >60 03/24/2020 0430   GFRAA >60 03/24/2020 0430    Lipase     Component Value Date/Time   LIPASE 18 03/03/2020 1214       Studies/Results: DG Chest Port 1 View  Result Date: 03/22/2020 CLINICAL DATA:  Acute respiratory failure. EXAM: PORTABLE CHEST 1 VIEW COMPARISON:  March 20, 2020. FINDINGS: The heart size and mediastinal contours are within normal limits. Tracheostomy tube is in grossly good position. Nasogastric tube is seen entering stomach. Right-sided PICC line is unchanged in position. No pneumothorax or pleural effusion is noted. Extensive emphysematous disease is noted. No acute abnormality is noted. The visualized skeletal structures are unremarkable. IMPRESSION: Stable support apparatus. Stable extensive emphysematous disease. No acute cardiopulmonary abnormality seen. Emphysema (ICD10-J43.9). Electronically Signed   By: Marijo Conception M.D.   On: 03/22/2020 12:58    Anti-infectives: Anti-infectives (From admission, onward)   Start     Dose/Rate Route Frequency Ordered Stop   03/18/20 0900  meropenem (MERREM) 1 g in sodium chloride 0.9 % 100 mL IVPB     1 g 200 mL/hr over 30 Minutes Intravenous Every 8 hours 03/18/20 0838     03/15/20 2200  meropenem (MERREM) 1 g in sodium chloride 0.9 % 100 mL IVPB  Status:  Discontinued     1 g 200 mL/hr over 30 Minutes Intravenous Every  12 hours 03/15/20 1021 03/18/20 0838   03/14/20 1000  anidulafungin (ERAXIS) 100 mg in sodium chloride 0.9 % 100 mL IVPB  Status:  Discontinued     100 mg 78 mL/hr over 100 Minutes Intravenous Every 24 hours 03/13/20 0752 03/18/20 0849   03/13/20 0900  anidulafungin (ERAXIS) 200 mg in sodium chloride 0.9 % 200 mL IVPB     200 mg 78 mL/hr over 200 Minutes Intravenous  Once 03/13/20 0752 03/13/20 1241   03/11/20 2000  meropenem (MERREM) 500 mg in sodium chloride 0.9 % 100 mL IVPB  Status:  Discontinued     500 mg 200 mL/hr over 30 Minutes Intravenous Every 12 hours 03/11/20 1658 03/15/20 1021   03/11/20 1200  piperacillin-tazobactam (ZOSYN) IVPB 2.25 g   Status:  Discontinued     2.25 g 100 mL/hr over 30 Minutes Intravenous Every 6 hours 03/11/20 1058 03/11/20 1641   03/06/20 1200  piperacillin-tazobactam (ZOSYN) IVPB 3.375 g  Status:  Discontinued     3.375 g 12.5 mL/hr over 240 Minutes Intravenous Every 8 hours 03/06/20 0739 03/11/20 1058   03/05/20 1400  piperacillin-tazobactam (ZOSYN) IVPB 2.25 g  Status:  Discontinued     2.25 g 100 mL/hr over 30 Minutes Intravenous Every 8 hours 03/05/20 0752 03/06/20 0739   03/04/20 2200  piperacillin-tazobactam (ZOSYN) IVPB 3.375 g  Status:  Discontinued     3.375 g 12.5 mL/hr over 240 Minutes Intravenous Every 8 hours 03/03/20 2032 03/05/20 0752   03/03/20 1545  cefoTEtan (CEFOTAN) 2 g in sodium chloride 0.9 % 100 mL IVPB     2 g 200 mL/hr over 30 Minutes Intravenous On call to O.R. 03/03/20 1532 03/04/20 0444   03/03/20 1345  piperacillin-tazobactam (ZOSYN) IVPB 3.375 g     3.375 g 12.5 mL/hr over 240 Minutes Intravenous  Once 03/03/20 1330 03/03/20 1507       Assessment/Plan Septic shock  COPD-Gold stageIII-IV (FEV1 31%) Acute hypoxic respiratory failure, s/p tracheostomy4/7/21 AKI- Cr normalized ABL anemia -Hgb 9.2 (4/8), stable Malnutrition - on TPN, starting TF  Perforated sigmoid colon Proximal rectal cancer with obstruction S/p emergent Low anterior resection/ colostomy by Dr. Marcello Moores 03/03/20 POD#17 S/p exploratory laparotomy and colon resection with transverse colostomy 3/31 Dr. Hassell Done - POD#11 from second surgery.   -BID wet to drydressing changesto midline abdominal wound - WOC teamfollowing for ostomy teaching - s/p IR drain x2 4/7, follow cultures, pseudomonas present.   - ostomy viable and having some bowel function  FEN -NPO, TPN, TF at 10cc/hr VTE - SCDs,SQ heparin ID - Zosyn3/21>3/29; Eraxis 3/31>>4/5,Merrem 3/29>> Foley -wick  Plan: Continue drains, IV abx.  Increase tube feeds slowly.   CCM working on vent wean as tolerated.      LOS: 21  days    Milus Height, MD FACS Surgical Oncology, General Surgery, Trauma and [place] Surgery, [place] for weekday/non holidays Check [email or web address] for coverage night/weekend/holidays  Do not use SecureChat as it is not reliable for timely patient care.

## 2020-03-24 NOTE — Progress Notes (Signed)
PHARMACY - TOTAL PARENTERAL NUTRITION CONSULT NOTE   Indication: Prolonged ileus  Patient Measurements: Height: 5' 2"  (157.5 cm) Weight: 70 kg (154 lb 5.2 oz) IBW/kg (Calculated) : 50.1   Body mass index is 28.23 kg/m.   Assessment:  26 yoF admitted on 3/21 bowel perforation s/p surgery on 3/21 and 3/31.  She remains intubated, on vasopressors, on antibiotic and antifungal coverage.  Tube feeds were given 3/27-3/29, then held.  OGT is still to suction, has bilious drainage.   Pharmacy is consulted to dose TPN.    Glucose / Insulin: A1c 5.9 - mSSI q4h (used 14 units from SSI in the past 24 hrs) - on solumedrol 40 mg daily--> reduced to 20 mg daily on 4/5 - d/c'd 4/7 - CBGs (goal <150): 131-149 Electrolytes: Na down 140  (free water added on 4/9), phos remains at 2.4 (s/p 10 mmol Kphos on 4/10); K 3.8 (s/p repletion and increasing K in TPN on 4/10), Mag 2, CorrCa wnl -  Cl and CO2 wnl Renal: SCr wnl, BUN 54 LFTs / TGs: LFTs slightly elevated / TG up 163 (on 4/7); Alk phos up 212 Prealbumin / albumin: 13.8 / 2.2 Intake / Output; MIVF: last 24hrs, - 1.1L,Total net I/O of -520m for admission - D5W at 50 ml/hr d/ced on 4/9  - free water 200 ml q4h Medications:  MVI, PPI -  Propofol started early 4/4 --> off at 0800 on 4/5 GI Imaging: Surgeries / Procedures:  3/21 emergent Low anterior resection/ colostomy  3/31 exploratory laparotomy and colon resection with transverse colostomy 4/3: trial of clamping OGT today per CCS note 4/6: TPN d/ced d/t high K 4/8-4/9: removed Na from TPN  Central access: CVC triple lumen (placed 3/27) TPN start date: 4/1  Nutritional Goals (per RD recommendation on 4/8): Kcal:  1420, Protein:  127-140 g, Fluid:  >/= 1.5 L/day Goal TPN rate is 80 mL/hr (124g protein, 1570kcal/day per new calculations 4/8) - Glucose infusion rate will be at 2.1 mg/kg/min with new  (Maximum 5 mg/kg/min)  Current Nutrition:  - TPN  -  4/8:  start TFs at trickle of 10  ml/hr with no plans to advance for 1 to 2 days per CCS - 4/10: Plan to adv to 30 ml/hr this evening if patient tolerates. D/W with Dr. GJohney Maine he said to continue patient on full TPN for now and will plan on weaning TPN if she's able to tolerate TF -4/10: TF running at 20 mL/hr - 4/11: increasing TF to 30 ml/hr. Per Dr. BBarry Dienes continue TPN at full rate for now  Plan:  Now:  - potassium phosphate IV 10 mmol x1  At 1800: - Continue TPN at goal rate of 80 ml/hr  - per CCS, advancing TF to 30 ml/hr today  - Electrolytes in TPN: Na 30 meq/L, 50 mEq/L of K, 578m/L of Ca, 28m97mL of Mg, 20 mmol/L of Phos; 1:1 for Cl: Ac - Add standard MVI to TPN on MWF only  - Add standard trace elements to TPN daily - Continue SSI q4h moderate scale - Monitor TPN labs on Mon/Thurs and as needed   AnhDia SitterharmD, BCPS 03/24/2020 7:20 AM

## 2020-03-25 ENCOUNTER — Inpatient Hospital Stay (HOSPITAL_COMMUNITY): Payer: Medicare HMO

## 2020-03-25 LAB — AEROBIC/ANAEROBIC CULTURE W GRAM STAIN (SURGICAL/DEEP WOUND)
Special Requests: NORMAL
Special Requests: NORMAL

## 2020-03-25 LAB — CBC
HCT: 27.6 % — ABNORMAL LOW (ref 36.0–46.0)
Hemoglobin: 8.5 g/dL — ABNORMAL LOW (ref 12.0–15.0)
MCH: 30.8 pg (ref 26.0–34.0)
MCHC: 30.8 g/dL (ref 30.0–36.0)
MCV: 100 fL (ref 80.0–100.0)
Platelets: 341 10*3/uL (ref 150–400)
RBC: 2.76 MIL/uL — ABNORMAL LOW (ref 3.87–5.11)
RDW: 16 % — ABNORMAL HIGH (ref 11.5–15.5)
WBC: 11.9 10*3/uL — ABNORMAL HIGH (ref 4.0–10.5)
nRBC: 0 % (ref 0.0–0.2)

## 2020-03-25 LAB — DIFFERENTIAL
Abs Immature Granulocytes: 0.1 10*3/uL — ABNORMAL HIGH (ref 0.00–0.07)
Basophils Absolute: 0 10*3/uL (ref 0.0–0.1)
Basophils Relative: 0 %
Eosinophils Absolute: 0.1 10*3/uL (ref 0.0–0.5)
Eosinophils Relative: 1 %
Immature Granulocytes: 1 %
Lymphocytes Relative: 5 %
Lymphs Abs: 0.6 10*3/uL — ABNORMAL LOW (ref 0.7–4.0)
Monocytes Absolute: 0.7 10*3/uL (ref 0.1–1.0)
Monocytes Relative: 6 %
Neutro Abs: 10.4 10*3/uL — ABNORMAL HIGH (ref 1.7–7.7)
Neutrophils Relative %: 87 %

## 2020-03-25 LAB — COMPREHENSIVE METABOLIC PANEL
ALT: 62 U/L — ABNORMAL HIGH (ref 0–44)
AST: 60 U/L — ABNORMAL HIGH (ref 15–41)
Albumin: 2.2 g/dL — ABNORMAL LOW (ref 3.5–5.0)
Alkaline Phosphatase: 182 U/L — ABNORMAL HIGH (ref 38–126)
Anion gap: 10 (ref 5–15)
BUN: 49 mg/dL — ABNORMAL HIGH (ref 8–23)
CO2: 24 mmol/L (ref 22–32)
Calcium: 8.2 mg/dL — ABNORMAL LOW (ref 8.9–10.3)
Chloride: 104 mmol/L (ref 98–111)
Creatinine, Ser: 0.42 mg/dL — ABNORMAL LOW (ref 0.44–1.00)
GFR calc Af Amer: >60 mL/min (ref >60)
GFR calc non Af Amer: >60 mL/min (ref >60)
Glucose, Bld: 123 mg/dL — ABNORMAL HIGH (ref 70–99)
Potassium: 3.9 mmol/L (ref 3.5–5.1)
Sodium: 138 mmol/L (ref 135–145)
Total Bilirubin: 0.3 mg/dL (ref 0.3–1.2)
Total Protein: 5.2 g/dL — ABNORMAL LOW (ref 6.5–8.1)

## 2020-03-25 LAB — GLUCOSE, CAPILLARY
Glucose-Capillary: 108 mg/dL — ABNORMAL HIGH (ref 70–99)
Glucose-Capillary: 122 mg/dL — ABNORMAL HIGH (ref 70–99)
Glucose-Capillary: 124 mg/dL — ABNORMAL HIGH (ref 70–99)
Glucose-Capillary: 127 mg/dL — ABNORMAL HIGH (ref 70–99)
Glucose-Capillary: 146 mg/dL — ABNORMAL HIGH (ref 70–99)
Glucose-Capillary: 151 mg/dL — ABNORMAL HIGH (ref 70–99)
Glucose-Capillary: 159 mg/dL — ABNORMAL HIGH (ref 70–99)

## 2020-03-25 LAB — TRIGLYCERIDES: Triglycerides: 114 mg/dL (ref <150)

## 2020-03-25 LAB — MAGNESIUM: Magnesium: 2 mg/dL (ref 1.7–2.4)

## 2020-03-25 LAB — PHOSPHORUS: Phosphorus: 2.6 mg/dL (ref 2.5–4.6)

## 2020-03-25 LAB — PREALBUMIN: Prealbumin: 16.9 mg/dL — ABNORMAL LOW (ref 18–38)

## 2020-03-25 MED ORDER — TRAVASOL 10 % IV SOLN
INTRAVENOUS | Status: DC
  Filled 2020-03-25: qty 1248

## 2020-03-25 MED ORDER — TRAVASOL 10 % IV SOLN
INTRAVENOUS | Status: DC
  Filled 2020-03-25: qty 624

## 2020-03-25 MED ORDER — ALPRAZOLAM 0.25 MG PO TABS
0.2500 mg | ORAL_TABLET | Freq: Three times a day (TID) | ORAL | Status: DC | PRN
  Administered 2020-03-25 – 2020-04-05 (×13): 0.25 mg via ORAL
  Filled 2020-03-25 (×13): qty 1

## 2020-03-25 MED ORDER — VITAL HIGH PROTEIN PO LIQD: 1000.0000 mL | ORAL | Status: DC

## 2020-03-25 NOTE — Progress Notes (Signed)
Referring Physician(s): Byerly,F  Supervising Physician: Sandi Mariscal  Patient Status:  [facility] - In-pt  Chief Complaint:  Abdominal fluid collections  Subjective: Patient remains on vent, husband in room; no acute changes; off pressors   Allergies: Codeine  Medications: Prior to Admission medications   Medication Sig Start Date End Date Taking? Authorizing Provider  acetaminophen (TYLENOL) 325 MG tablet Take 650 mg by mouth every 6 (six) hours as needed for moderate pain.   Yes [provider]  albuterol (PROVENTIL) (2.5 MG/3ML) 0.083% nebulizer solution INHALE 3ML VIA NEBULIZER EVERY 4 HOURS AS NEEDED FOR SHORTNESS OF BREATH 11/23/19  Yes Icard, Bradley L, DO  b complex vitamins tablet Take 1 tablet by mouth daily.   Yes [provider]  Fluticasone-Umeclidin-Vilant (TRELEGY ELLIPTA) 100-62.5-25 MCG/INH AEPB Inhale 1 puff into the lungs daily. 02/15/20  Yes Icard, Bradley L, DO  VITAMIN D, CHOLECALCIFEROL, PO Take 1 tablet by mouth daily.   Yes [provider]     Vital Signs: BP 97/64   Pulse 89   Temp 99.2 F (37.3 C) (Axillary)   Resp (!) 33   Ht 5\' 2"  (1.575 m)   Wt 154 lb 15.7 oz (70.3 kg)   LMP 12/14/2000   SpO2 96%   BMI 28.35 kg/m   Physical Exam on vent, left abdominal drains intact, dressing clean and dry, output about 35 cc each of beige colored  to slightly blood-tinged fluid  Imaging: DG Chest Port 1 View  Result Date: 03/25/2020 CLINICAL DATA:  Respiratory failure EXAM: PORTABLE CHEST 1 VIEW COMPARISON:  March 22, 2020 FINDINGS: Tracheostomy catheter tip is 5.7 cm above the carina. Central catheter tip is in the superior vena cava. Nasogastric tube tip and side port are below the diaphragm. No pneumothorax. There is underlying emphysematous change. No edema or airspace opacity. The heart size is normal. Pulmonary vascularity is stable and reflects the underlying emphysematous change. No adenopathy. No bone lesions. IMPRESSION:  Tube and catheter positions as described without pneumothorax. No edema or airspace opacity. Stable cardiac silhouette. Emphysema (ICD10-J43.9). Electronically Signed   By: Lowella Grip III M.D.   On: 03/25/2020 07:58   DG Chest Port 1 View  Result Date: 03/22/2020 CLINICAL DATA:  Acute respiratory failure. EXAM: PORTABLE CHEST 1 VIEW COMPARISON:  March 20, 2020. FINDINGS: The heart size and mediastinal contours are within normal limits. Tracheostomy tube is in grossly good position. Nasogastric tube is seen entering stomach. Right-sided PICC line is unchanged in position. No pneumothorax or pleural effusion is noted. Extensive emphysematous disease is noted. No acute abnormality is noted. The visualized skeletal structures are unremarkable. IMPRESSION: Stable support apparatus. Stable extensive emphysematous disease. No acute cardiopulmonary abnormality seen. Emphysema (ICD10-J43.9). Electronically Signed   By: Marijo Conception M.D.   On: 03/22/2020 12:58   DG Abd Portable 1V  Result Date: 03/24/2020 CLINICAL DATA:  Post NG tube placement EXAM: PORTABLE ABDOMEN - 1 VIEW COMPARISON:  03/21/2020 FINDINGS: Gastric tube terminating in distal body or gastric antrum. Pigtail drainage catheter is along the left flank. Limited gas the abdomen with nonspecific bowel gas pattern. Rectum not imaged. Lung bases are clear. Spinal degenerative changes. IMPRESSION: 1. Gastric tube terminating in the distal body or gastric antrum. 2. Pigtail drainage catheter along the left flank. Electronically Signed   By: Zetta Bills M.D.   On: 03/24/2020 12:38    Labs:  CBC: Recent Labs    03/19/20 0415 03/21/20 0327 03/23/20 0635 03/25/20 0520  WBC  18.4* 17.7* 14.9* 11.9*  HGB 8.8* 9.2* 8.3* 8.5*  HCT 28.9* 30.0* 27.4* 27.6*  PLT 450* 390 334 341    COAGS: Recent Labs    03/09/20 1752  INR 1.3*    BMP: Recent Labs    03/22/20 0427 03/23/20 0635 03/24/20 0430 03/25/20 0520  NA 147* 142 140 138  K  3.0* 2.6* 3.8 3.9  CL 107 101 106 104  CO2 29 29 28 24   GLUCOSE 147* 153* 135* 123*  BUN 65* 64* 54* 49*  CALCIUM 8.3* 8.5* 8.2* 8.2*  CREATININE 0.59 0.54 0.49 0.42*  GFRNONAA >60 >60 >60 >60  GFRAA >60 >60 >60 >60    LIVER FUNCTION TESTS: Recent Labs    03/22/20 0427 03/23/20 0635 03/24/20 0430 03/25/20 0520  BILITOT 0.5 0.6 0.6 0.3  AST 28 44* 55* 60*  ALT 27 36 45* 62*  ALKPHOS 84 167* 212* 182*  PROT 4.9* 5.0* 5.2* 5.2*  ALBUMIN 2.4* 2.3* 2.2* 2.2*    Assessment and Plan: Patient with history of perforated sigmoid colon, proximal rectal cancer with prior LAR/colon resection/colostomy; post opleft abdominal fluid collections, status post drain placement x2 on 4/7; temp 99.2;  WBC 11.9(14.9), hgb 8.5(8.3), creat 0.42, drain fluid cx- pseudomonas; continue with drain irrigation, output monitoring, lab checks. Once drain outputs are less than 10 to 15 cc/day for 2 to 3 days obtain follow-up CT. Other plans per surgery/critical care   Electronically Signed: D. Rowe Robert, PA-C 03/25/2020, 2:33 PM   I spent a total of 15 minutes at the the patient's bedside AND on the patient's hospital floor or unit, greater than 50% of which was counseling/coordinating care for left abdominal abscess drains   Patient ID: Tracy [name], female   DOB: 30-Jun-1951, 69 y.o.   MRN: [id number]

## 2020-03-25 NOTE — Consult Note (Addendum)
[name] ostomy follow up Stoma type/location: LLQ colostomy; bedside nurse changed pouch earlier this morning, according to patient's husband.  Current pouch is intact with good seal, small amt tan drainage.  Stoma is red and viable when visualized through the pouch..  Enrolled patient in [place] Start Discharge program: Yes; previously Supplies at the bedside for staff nurse use.  Pt is currently on the vent and not very awake; not ready for teaching.  Husband has been present for multiple teaching sessions.  [name] will continue to follow Tracy Girt MSN, RN, [name], [name], [name]

## 2020-03-25 NOTE — Progress Notes (Signed)
[facility] Progress Note  13 Days Post-Op  Subjective: CC-  Not weaning as well today from the ventilator. Off pressors. Tolerating tube feedings at 40cc/hr.  Objective: Vital signs in last 24 hours: Temp:  [98.1 F (36.7 C)-101 F (38.3 C)] 99.2 F (37.3 C) (04/12 1121) Pulse Rate:  [85-101] 93 (04/12 1121) Resp:  [21-33] 24 (04/12 1121) BP: (94-134)/(65-87) 99/73 (04/12 1100) SpO2:  [92 %-100 %] 98 % (04/12 1121) FiO2 (%):  [30 %] 30 % (04/12 1118) Weight:  [70.3 kg] 70.3 kg (04/12 0500) Last BM Date: 03/25/20  Intake/Output from previous day: 04/11 0701 - 04/12 0700 In: 2606.1 [I.V.:1247.6; NG/GT:1211.3; IV Piggyback:147.2] Out: 3125 [Urine:2000; Drains:225; Stool:900] Intake/Output this shift: Total I/O In: 2669.1 [I.V.:2155.1; Other:20; NG/GT:494] Out: [id number] [Urine:400; Emesis/NG output:5; Drains:70; Stool:150]  PE: Gen: Drowsy, NAD HEENT: trach in place Pulm:Mechanically ventilated [id number], mild distension, open midline incision light pink with trace slough/ no significant drainage, ostomy viable withnew pouch in place. Surgical drain serous. IR drain x2 - 1 is serous/cloudy and the other cloudy/red [id number] soft and nontender without edema Skin: warm and dry  Lab Results:  Recent Labs    03/23/20 0635 03/25/20 0520  WBC 14.9* 11.9*  HGB 8.3* 8.5*  HCT 27.4* 27.6*  PLT 334 341   BMET Recent Labs    03/24/20 0430 03/25/20 0520  NA 140 138  K 3.8 3.9  CL 106 104  CO2 28 24  GLUCOSE 135* 123*  BUN 54* 49*  CREATININE 0.49 0.42*  CALCIUM 8.2* 8.2*   PT/INR No results for input(s): LABPROT, INR in the last 72 hours. CMP     Component Value Date/Time   NA 138 03/25/2020 0520   K 3.9 03/25/2020 0520   CL 104 03/25/2020 0520   CO2 24 03/25/2020 0520   GLUCOSE 123 (H) 03/25/2020 0520   BUN 49 (H) 03/25/2020 0520   CREATININE 0.42 (L) 03/25/2020 0520   CALCIUM 8.2 (L) 03/25/2020 0520   PROT 5.2 (L) 03/25/2020 0520   ALBUMIN 2.2 (L) 03/25/2020 0520   AST 60 (H) 03/25/2020 0520   ALT 62 (H) 03/25/2020 0520   ALKPHOS 182 (H) 03/25/2020 0520   BILITOT 0.3 03/25/2020 0520   GFRNONAA >60 03/25/2020 0520   GFRAA >60 03/25/2020 0520   Lipase     Component Value Date/Time   LIPASE 18 03/03/2020 1214       Studies/Results: DG Chest Port 1 View  Result Date: 03/25/2020 CLINICAL DATA:  Respiratory failure EXAM: PORTABLE CHEST 1 VIEW COMPARISON:  March 22, 2020 FINDINGS: Tracheostomy catheter tip is 5.7 cm above the carina. Central catheter tip is in the superior vena cava. Nasogastric tube tip and side port are below the diaphragm. No pneumothorax. There is underlying emphysematous change. No edema or airspace opacity. The heart size is normal. Pulmonary vascularity is stable and reflects the underlying emphysematous change. No adenopathy. No bone lesions. IMPRESSION: Tube and catheter positions as described without pneumothorax. No edema or airspace opacity. Stable cardiac silhouette. Emphysema (ICD10-J43.9). Electronically Signed   By: Lowella Grip III M.D.   On: 03/25/2020 07:58   DG Abd Portable 1V  Result Date: 03/24/2020 CLINICAL DATA:  Post NG tube placement EXAM: PORTABLE ABDOMEN - 1 VIEW COMPARISON:  03/21/2020 FINDINGS: Gastric tube terminating in distal body or gastric antrum. Pigtail drainage catheter is along the left flank. Limited gas the abdomen with nonspecific bowel gas pattern. Rectum not imaged. Lung bases are clear. Spinal degenerative changes. IMPRESSION: 1. Gastric  tube terminating in the distal body or gastric antrum. 2. Pigtail drainage catheter along the left flank. Electronically Signed   By: Zetta Bills M.D.   On: 03/24/2020 12:38    Anti-infectives: Anti-infectives (From admission, onward)   Start     Dose/Rate Route Frequency Ordered Stop   03/18/20 0900  meropenem (MERREM) 1 g in sodium chloride 0.9 % 100 mL IVPB     1 g 200 mL/hr over 30 Minutes Intravenous Every 8  hours 03/18/20 0838     03/15/20 2200  meropenem (MERREM) 1 g in sodium chloride 0.9 % 100 mL IVPB  Status:  Discontinued     1 g 200 mL/hr over 30 Minutes Intravenous Every 12 hours 03/15/20 1021 03/18/20 0838   03/14/20 1000  anidulafungin (ERAXIS) 100 mg in sodium chloride 0.9 % 100 mL IVPB  Status:  Discontinued     100 mg 78 mL/hr over 100 Minutes Intravenous Every 24 hours 03/13/20 0752 03/18/20 0849   03/13/20 0900  anidulafungin (ERAXIS) 200 mg in sodium chloride 0.9 % 200 mL IVPB     200 mg 78 mL/hr over 200 Minutes Intravenous  Once 03/13/20 0752 03/13/20 1241   03/11/20 2000  meropenem (MERREM) 500 mg in sodium chloride 0.9 % 100 mL IVPB  Status:  Discontinued     500 mg 200 mL/hr over 30 Minutes Intravenous Every 12 hours 03/11/20 1658 03/15/20 1021   03/11/20 1200  piperacillin-tazobactam (ZOSYN) IVPB 2.25 g  Status:  Discontinued     2.25 g 100 mL/hr over 30 Minutes Intravenous Every 6 hours 03/11/20 1058 03/11/20 1641   03/06/20 1200  piperacillin-tazobactam (ZOSYN) IVPB 3.375 g  Status:  Discontinued     3.375 g 12.5 mL/hr over 240 Minutes Intravenous Every 8 hours 03/06/20 0739 03/11/20 1058   03/05/20 1400  piperacillin-tazobactam (ZOSYN) IVPB 2.25 g  Status:  Discontinued     2.25 g 100 mL/hr over 30 Minutes Intravenous Every 8 hours 03/05/20 0752 03/06/20 0739   03/04/20 2200  piperacillin-tazobactam (ZOSYN) IVPB 3.375 g  Status:  Discontinued     3.375 g 12.5 mL/hr over 240 Minutes Intravenous Every 8 hours 03/03/20 2032 03/05/20 0752   03/03/20 1545  cefoTEtan (CEFOTAN) 2 g in sodium chloride 0.9 % 100 mL IVPB     2 g 200 mL/hr over 30 Minutes Intravenous On call to O.R. 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 Marcello Moores 03/03/20 POD#17 S/p exploratory laparotomy and colon resection with transverse colostomy 3/31 Dr. Hassell Done -POD#12 from second surgery.   -BID wet to drydressing changesto midline abdominal wound - WOC teamfollowing for ostomy teaching - s/p IR drain x2 4/7, culture PSEUDOMONAS AERUGINOSA   - ostomy viable and having some bowel function  FEN -NPO, 1/2 TPN, TF at 40cc/hr VTE - SCDs,SQ heparin ID - Zosyn3/21>3/29; Eraxis 3/31>>4/5,Merrem 3/29>> Foley -wick  Plan:Increase TF to 40cc/hr, wean TPN to 1/2 rate. Continue drains, IV abx. Monitor drain output, once a little lower volume will consider repeat CT scan. CCM working on vent wean as tolerated.  LOS: 22 days    [facility] 03/25/2020, 12:38 PM Please see Amion for pager number during day hours 7:00am-4:30pm

## 2020-03-25 NOTE — Progress Notes (Addendum)
PHARMACY - TOTAL PARENTERAL NUTRITION CONSULT NOTE   Indication: Prolonged ileus  Patient Measurements: Height: 5' 2"  (157.5 cm) Weight: 70.3 kg (154 lb 15.7 oz) IBW/kg (Calculated) : 50.1   Body mass index is 28.35 kg/m.   Assessment:  45 yoF admitted on 3/21 bowel perforation s/p surgery on 3/21 and 3/31.  She remains intubated, on vasopressors, on antibiotic coverage.  Tube feeds were given 3/27-3/29, then held.  OGT is still to suction, has bilious drainage.   Pharmacy is consulted to dose TPN.    Glucose / Insulin: A1c 5.9 - mSSI q4h (used 10 units from SSI in the past 24 hrs) - on solumedrol 40 mg daily--> reduced to 20 mg daily on 4/5 - d/c'd 4/7 - CBGs (goal <150): 123-159 Electrolytes: Na down 138  (free water added on 4/9), phos up to 2.6 (s/p 10 mmol Kphos on 4/11); K 3.9, Mag 2, CorrCa wnl -  Cl and CO2 wnl Renal: SCr wnl, BUN 49 LFTs / TGs: LFTs slightly elevated / TG 114; Alk phos 182 Prealbumin / albumin: 16.9 / 2.2 Intake / Output; MIVF: last 24hrs, - -537m,Total net I/O of -4441mfor admission - D5W at 50 ml/hr d/ced on 4/9  - free water 200 ml q4h - ostomy output 900 ml Medications:  MVI, PPI -  Propofol started early 4/4 --> off at 0800 on 4/5 GI Imaging: Surgeries / Procedures:  3/21 emergent Low anterior resection/ colostomy  3/31 exploratory laparotomy and colon resection with transverse colostomy 4/3: trial of clamping OGT today per CCS note 4/6: TPN d/ced d/t high K 4/8-4/9: removed Na from TPN  Central access: CVC triple lumen (placed 3/27) TPN start date: 4/1  Nutritional Goals (per RD recommendation on 4/8): Kcal:  1420, Protein:  127-140 g, Fluid:  >/= 1.5 L/day Goal TPN rate is 80 mL/hr (124g protein, 1570kcal/day per new calculations 4/8) - Glucose infusion rate will be at 2.1 mg/kg/min with new  (Maximum 5 mg/kg/min)  Current Nutrition:  - TPN  -  4/8:  start TFs at trickle of 10 ml/hr with no plans to advance for 1 to 2 days per CCS -  4/10: Plan to adv to 30 ml/hr this evening if patient tolerates. D/W with Dr. GrJohney Mainehe said to continue patient on full TPN for now and will plan on weaning TPN if she's able to tolerate TF -4/10: TF running at 20 mL/hr - 4/11: tolerating TF at 30 ml/hr. Per CCS PA, will consider increasing TF to goal today and wean TPN tomorrow if tolerating  Plan:  At 1800: - Continue TPN at goal rate of 80 ml/hr  - Electrolytes in TPN: Na 30 meq/L, 50 mEq/L of K, 87m53mL of Ca, 87mE56m of Mg, 20 mmol/L of Phos; 1:1 for Cl: Ac - Add standard MVI to TPN on MWF only  - Add standard trace elements to TPN daily - Continue SSI q4h moderate scale - Monitor TPN labs on Mon/Thurs and as needed - BMET, Mg, Phos in AM   ScotUlice DasharmD, BCPS  03/25/2020 9:25 AM   Addendum Wean TPN to 1/2 rate 40 ml/hr per CCS.   ScotUlice DasharmD, BCPS 03/25/20 1:17 PM

## 2020-03-25 NOTE — Progress Notes (Signed)
NAME:  Tracy [name], MRN:  [id number], DOB:  [birth year], LOS: 53 ADMISSION DATE:  03/03/2020, CONSULTATION DATE:  3/27  REFERRING MD:  Ninfa Linden, CHIEF COMPLAINT:  Abdominal pain   Brief History   69 y/o female admitted 3/21 with bowel perforation requiring colostomy and lower anterior resection in setting of newly diagnosed proximal rectal cancer with sigmoid perforation.  Post op developed septic shock, HCAP AKI ileus and acute hypoxemic respiratory failure requiring intubation.   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 Valeta Harms. Not on O2.  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, metastatic carcinoma in 3/15 lymph nodes  LE [name] 3/27 >> negative for DVT  ECHO 3/27 >> poor windows, normal LV function, severely dilated RV with moderately reduced function, RV volume and pressure overload, mildly dilated RA  V/Q Scan 3/30 >> low probability for PE  4/6 CT chest abdomen pelvis> 5.5x3.2 fluid collection along stomach, surgical drain again noted in pelvis, interval development of crescent shaped fluid collection measuring 16.4 x 3.3 in epigastric region and left  upper quadrant of abdomen (could be abscess) multiple other smaller fluid collections, colostomy noted, mild free fluid in posterior pelvis  Micro Data:  COVID 3/21 >> Negative MRSA PCR 3/21 >> Negative Blood cultures 3/27 >> negative  Tracheal Aspirate 3/29 >> Saccharomyces cerevisiae Trach aspirate 4/2 >> saccharomyces cerevisiae abscess abdomen 4/7 >> pseudomonas  Antimicrobials:  Zosyn 3/21 >> 3/29 Anidulafungin 3/31 >> 4/5 Meropenem 3/29 >>   Interim history/subjective:  Did not do well on pressure support weans today.  Back on full vent support.  Objective   Blood pressure 127/86, pulse 94, temperature 98.7 F (37.1 C), temperature source Oral, resp. rate (!) 26, height 5\' 2"  (1.575 m), weight 70.3 kg, last menstrual period 12/14/2000, SpO2 96 %.    Vent Mode: PRVC FiO2 (%):  [30 %] 30 % Set Rate:  [10 bmp] 10 bmp Vt Set:  [400 mL] 400 mL PEEP:  [5 cmH20] 5 cmH20 Plateau Pressure:  [12 cmH20-21 cmH20] 17 cmH20   Intake/Output Summary (Last 24 hours) at 03/25/2020 0826 Last data filed at 03/25/2020 0449 Gross per 24 hour  Intake 2606.11 ml  Output 3125 ml  Net -518.89 ml   Filed Weights   03/23/20 0500 03/24/20 0442 03/25/20 0500  Weight: 70.1 kg 70 kg 70.3 kg    Examination: Gen:      No acute distress HEENT:  EOMI, sclera anicteric Neck:     No masses; no thyromegaly Lungs:    Clear to auscultation bilaterally; normal respiratory effort CV:         Regular rate and rhythm; no murmurs  Abd:      + bowel sounds; soft, non-tender; no palpable masses, no distension Ext:    No edema; adequate peripheral perfusion Skin:      Warm and dry; no rash Neuro: Awake, responsive  Labs significant for AST 60, ALT 62, WBC improved to 11.9, hemoglobin stable at 8.5 Chest x-ray 4/12- emphysema, no acute abnormality  Resolved Hospital Problem list   AKI from ATN in setting of sepsis, Hypernatremia  Assessment & Plan:   Acute hypoxic respiratory failure. Failure to wean  s/p tracheostomy. Severe COPD with emphysema. Continue pressure support weans as tolerated We will need LTAC placement Continue Pulmicort, Brovana Intermittent chest x-ray Xanax PRN for anxiety  Bowel perforation/partial colectomy. Colon cancer. Moderate malnutrition. - post op care, nutrition per CCS  Abdominal abscess with Pseudomonas. Sepsis, present on admission. - drain placed 4/07 - day 15/15 of ABx, currently on meropenem  Hypokalemia, hypophosphatemia, hypomagnesemia. - f/u labs  Hypertension. - goal SBP < 140, DBP < 105  Anemia of critical and chronic illness. - f/u CBC - transfuse for Hb < 7 or significant bleeding  Hyperglycemia. - SSI  Best practice:  Diet: TPN DVT prophylaxis: sub q heparin  GI prophylaxis: protonix Mobility: bed rest Code Status: full Family Communication: Per primary team. Disposition: ICU  Labs    CMP Latest Ref Rng & Units 03/25/2020 03/24/2020 03/23/2020  Glucose 70 - 99 mg/dL 123(H) 135(H) 153(H)  BUN 8 - 23 mg/dL 49(H) 54(H) 64(H)  Creatinine 0.44 - 1.00 mg/dL 0.42(L) 0.49 0.54  Sodium 135 - 145 mmol/L 138 140 142  Potassium 3.5 - 5.1 mmol/L 3.9 3.8 2.6(LL)  Chloride 98 - 111 mmol/L 104 106 101  CO2 22 - 32 mmol/L 24 28 29   Calcium 8.9 - 10.3 mg/dL 8.2(L) 8.2(L) 8.5(L)  Total Protein 6.5 - 8.1 g/dL 5.2(L) 5.2(L) 5.0(L)  Total Bilirubin 0.3 - 1.2 mg/dL 0.3 0.6 0.6  Alkaline Phos 38 - 126 U/L 182(H) 212(H) 167(H)  AST 15 - 41 U/L 60(H) 55(H) 44(H)  ALT 0 - 44 U/L 62(H) 45(H) 36    CBC Latest Ref Rng & Units 03/25/2020 03/23/2020 03/21/2020  WBC 4.0 - 10.5 K/uL 11.9(H) 14.9(H) 17.7(H)  Hemoglobin 12.0 - 15.0 g/dL 8.5(L) 8.3(L) 9.2(L)  Hematocrit 36.0 - 46.0 % 27.6(L) 27.4(L) 30.0(L)  Platelets 150 - 400 K/uL 341 334 390    ABG    Component Value Date/Time   PHART 7.398 03/16/2020 2245   PCO2ART 45.6 03/16/2020 2245   PO2ART 70.3 (L) 03/16/2020 2245   HCO3 27.7 03/16/2020 2245   TCO2 24 03/12/2020 1513   ACIDBASEDEF  4.8 (H) 03/12/2020 1738   O2SAT 93.5 03/16/2020 2245    CBG (last 3)  Recent Labs    03/25/20 0008 03/25/20 0339 03/25/20 0742  GLUCAP 127* 124* 159*    Signature:  The patient is critically ill with multiple organ system failure and requires high complexity decision making for assessment and support, frequent evaluation and titration of therapies, advanced monitoring, review of radiographic studies and interpretation of complex data.   Critical Care Time devoted to patient care services, exclusive of separately billable procedures, described in this note is 35 minutes.   Marshell Garfinkel MD [facility] Please see [email or web address] for pager details.  03/25/2020, 8:35 AM

## 2020-03-26 LAB — BASIC METABOLIC PANEL
Anion gap: 6 (ref 5–15)
BUN: 45 mg/dL — ABNORMAL HIGH (ref 8–23)
CO2: 24 mmol/L (ref 22–32)
Calcium: 8 mg/dL — ABNORMAL LOW (ref 8.9–10.3)
Chloride: 109 mmol/L (ref 98–111)
Creatinine, Ser: 0.43 mg/dL — ABNORMAL LOW (ref 0.44–1.00)
GFR calc Af Amer: >60 mL/min (ref >60)
GFR calc non Af Amer: >60 mL/min (ref >60)
Glucose, Bld: 118 mg/dL — ABNORMAL HIGH (ref 70–99)
Potassium: 3.8 mmol/L (ref 3.5–5.1)
Sodium: 139 mmol/L (ref 135–145)

## 2020-03-26 LAB — GLUCOSE, CAPILLARY
Glucose-Capillary: 118 mg/dL — ABNORMAL HIGH (ref 70–99)
Glucose-Capillary: 131 mg/dL — ABNORMAL HIGH (ref 70–99)
Glucose-Capillary: 132 mg/dL — ABNORMAL HIGH (ref 70–99)
Glucose-Capillary: 142 mg/dL — ABNORMAL HIGH (ref 70–99)
Glucose-Capillary: 145 mg/dL — ABNORMAL HIGH (ref 70–99)
Glucose-Capillary: 73 mg/dL (ref 70–99)

## 2020-03-26 LAB — MAGNESIUM: Magnesium: 1.9 mg/dL (ref 1.7–2.4)

## 2020-03-26 LAB — PHOSPHORUS: Phosphorus: 2.9 mg/dL (ref 2.5–4.6)

## 2020-03-26 MED ORDER — VITAL HIGH PROTEIN PO LIQD
1000.0000 mL | ORAL | Status: DC
  Administered 2020-03-26 – 2020-03-27 (×3): 1000 mL

## 2020-03-26 NOTE — TOC Progression Note (Addendum)
Transition of Care [facility]) - Progression Note    Patient Details  Name: Tracy [name] MRN: [id number] Date of Birth: Sep 15, 1951  [facility] CM/SW Contact  Leeroy Cha, RN Phone Number: 03/26/2020, 9:46 AM  Clinical Narrative:    Both [facility] and [facility] contacted for possible admission. [facility] can make bed offer on the patient will contact md to see if precert is in order.  Expected Discharge Plan: [place] Barriers to Discharge: Continued Medical Work up  Expected Discharge Plan and Services Expected Discharge Plan: [place] In-house Referral: Clinical Social Work     Living arrangements for the past 2 months: Single Family Home                                       Social Determinants of Health (SDOH) Interventions    Readmission Risk Interventions Readmission Risk Prevention Plan 03/25/2020 03/04/2020  Medication Screening - Complete  Transportation Screening Complete Complete  Medication Review (RN Care Manager) Complete -  Some recent data might be hidden

## 2020-03-26 NOTE — Progress Notes (Addendum)
[facility] Progress Note  14 Days Post-Op  Subjective: CC-  Alert on the vent this morning. Husband at bedside.  Tolerating tube feedings at 40cc/hr. Around 10cc residual.  Objective: Vital signs in last 24 hours: Temp:  [97.9 F (36.6 C)-99.9 F (37.7 C)] 99.9 F (37.7 C) (04/13 0400) Pulse Rate:  [79-96] 86 (04/13 0600) Resp:  [23-36] 25 (04/13 0600) BP: (70-146)/(48-105) 103/60 (04/13 0600) SpO2:  [94 %-100 %] 95 % (04/13 0749) FiO2 (%):  [30 %] 30 % (04/13 0745) Weight:  [69.5 kg] 69.5 kg (04/13 0323) Last BM Date: 03/26/20  Intake/Output from previous day: 04/12 0701 - 04/13 0700 In: 4927.7 [I.V.:3115.8; NG/GT:1770.9] Out: 2622.5 [Urine:1525; Emesis/NG output:5; Drains:192.5; Stool:900] Intake/Output this shift: Total I/O In: -  Out: 300 [Drains:50; Stool:250]  PE: Gen: Alert,NAD HEENT: trach in place Pulm:Mechanically ventilated [id number], nondistended, open midline incision light pink with trace slough/ no significant drainage, ostomy viable withsmall amount of liquid stool in pouch. Surgical drain serous. IR drain x2 - output is cloudy [id number] soft and nontender without edema Skin: warm and dry  Lab Results:  Recent Labs    03/25/20 0520  WBC 11.9*  HGB 8.5*  HCT 27.6*  PLT 341   BMET Recent Labs    03/25/20 0520 03/26/20 0343  NA 138 139  K 3.9 3.8  CL 104 109  CO2 24 24  GLUCOSE 123* 118*  BUN 49* 45*  CREATININE 0.42* 0.43*  CALCIUM 8.2* 8.0*   PT/INR No results for input(s): LABPROT, INR in the last 72 hours. CMP     Component Value Date/Time   NA 139 03/26/2020 0343   K 3.8 03/26/2020 0343   CL 109 03/26/2020 0343   CO2 24 03/26/2020 0343   GLUCOSE 118 (H) 03/26/2020 0343   BUN 45 (H) 03/26/2020 0343   CREATININE 0.43 (L) 03/26/2020 0343   CALCIUM 8.0 (L) 03/26/2020 0343   PROT 5.2 (L) 03/25/2020 0520   ALBUMIN 2.2 (L) 03/25/2020 0520   AST 60 (H) 03/25/2020 0520   ALT 62 (H) 03/25/2020 0520   ALKPHOS  182 (H) 03/25/2020 0520   BILITOT 0.3 03/25/2020 0520   GFRNONAA >60 03/26/2020 0343   GFRAA >60 03/26/2020 0343   Lipase     Component Value Date/Time   LIPASE 18 03/03/2020 1214       Studies/Results: DG Chest Port 1 View  Result Date: 03/25/2020 CLINICAL DATA:  Respiratory failure EXAM: PORTABLE CHEST 1 VIEW COMPARISON:  March 22, 2020 FINDINGS: Tracheostomy catheter tip is 5.7 cm above the carina. Central catheter tip is in the superior vena cava. Nasogastric tube tip and side port are below the diaphragm. No pneumothorax. There is underlying emphysematous change. No edema or airspace opacity. The heart size is normal. Pulmonary vascularity is stable and reflects the underlying emphysematous change. No adenopathy. No bone lesions. IMPRESSION: Tube and catheter positions as described without pneumothorax. No edema or airspace opacity. Stable cardiac silhouette. Emphysema (ICD10-J43.9). Electronically Signed   By: Lowella Grip III M.D.   On: 03/25/2020 07:58   DG Abd Portable 1V  Result Date: 03/24/2020 CLINICAL DATA:  Post NG tube placement EXAM: PORTABLE ABDOMEN - 1 VIEW COMPARISON:  03/21/2020 FINDINGS: Gastric tube terminating in distal body or gastric antrum. Pigtail drainage catheter is along the left flank. Limited gas the abdomen with nonspecific bowel gas pattern. Rectum not imaged. Lung bases are clear. Spinal degenerative changes. IMPRESSION: 1. Gastric tube terminating in the distal body or gastric antrum. 2.  Pigtail drainage catheter along the left flank. Electronically Signed   By: Zetta Bills M.D.   On: 03/24/2020 12:38    Anti-infectives: Anti-infectives (From admission, onward)   Start     Dose/Rate Route Frequency Ordered Stop   03/18/20 0900  meropenem (MERREM) 1 g in sodium chloride 0.9 % 100 mL IVPB     1 g 200 mL/hr over 30 Minutes Intravenous Every 8 hours 03/18/20 0838     03/15/20 2200  meropenem (MERREM) 1 g in sodium chloride 0.9 % 100 mL IVPB   Status:  Discontinued     1 g 200 mL/hr over 30 Minutes Intravenous Every 12 hours 03/15/20 1021 03/18/20 0838   03/14/20 1000  anidulafungin (ERAXIS) 100 mg in sodium chloride 0.9 % 100 mL IVPB  Status:  Discontinued     100 mg 78 mL/hr over 100 Minutes Intravenous Every 24 hours 03/13/20 0752 03/18/20 0849   03/13/20 0900  anidulafungin (ERAXIS) 200 mg in sodium chloride 0.9 % 200 mL IVPB     200 mg 78 mL/hr over 200 Minutes Intravenous  Once 03/13/20 0752 03/13/20 1241   03/11/20 2000  meropenem (MERREM) 500 mg in sodium chloride 0.9 % 100 mL IVPB  Status:  Discontinued     500 mg 200 mL/hr over 30 Minutes Intravenous Every 12 hours 03/11/20 1658 03/15/20 1021   03/11/20 1200  piperacillin-tazobactam (ZOSYN) IVPB 2.25 g  Status:  Discontinued     2.25 g 100 mL/hr over 30 Minutes Intravenous Every 6 hours 03/11/20 1058 03/11/20 1641   03/06/20 1200  piperacillin-tazobactam (ZOSYN) IVPB 3.375 g  Status:  Discontinued     3.375 g 12.5 mL/hr over 240 Minutes Intravenous Every 8 hours 03/06/20 0739 03/11/20 1058   03/05/20 1400  piperacillin-tazobactam (ZOSYN) IVPB 2.25 g  Status:  Discontinued     2.25 g 100 mL/hr over 30 Minutes Intravenous Every 8 hours 03/05/20 0752 03/06/20 0739   03/04/20 2200  piperacillin-tazobactam (ZOSYN) IVPB 3.375 g  Status:  Discontinued     3.375 g 12.5 mL/hr over 240 Minutes Intravenous Every 8 hours 03/03/20 2032 03/05/20 0752   03/03/20 1545  cefoTEtan (CEFOTAN) 2 g in sodium chloride 0.9 % 100 mL IVPB     2 g 200 mL/hr over 30 Minutes Intravenous On call to O.R. 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 Marcello Moores 03/03/20 POD#17 S/p exploratory laparotomy and colon resection with transverse colostomy 3/31 Dr. Hassell Done -POD#13 from second surgery. -BID wet to drydressing changesto midline abdominal wound - WOC teamfollowing for ostomy teaching - s/p IR drain x2 4/7, culture PSEUDOMONAS AERUGINOSA  - ostomy viable and having some bowel function  FEN -NPO, TF at 55cc/hr VTE - SCDs,SQ heparin ID - Zosyn3/21>3/29; Eraxis 3/31>>4/5,Merrem 3/29>>4/13 Foley -wick  Plan:Increase TF to 55cc/hr and d/c TPN after current bag. Continue drains and monitor output, once volume decreases will plan repeat scan. Patient has been on appropriate antibiotic therapy since 3/29, discussed with MD and will stop antibiotics today. CCM working on vent wean as tolerated.   LOS:  23 days    [facility] 03/26/2020, 8:48 AM Please see Amion for pager number during day hours 7:00am-4:30pm

## 2020-03-26 NOTE — Progress Notes (Signed)
NAME:  Tracy [name], MRN:  [id number], DOB:  [birth year], LOS: 23 ADMISSION DATE:  03/03/2020, CONSULTATION DATE:  3/27  REFERRING MD:  Ninfa Linden, CHIEF COMPLAINT:  Abdominal pain   Brief History   69 y/o female admitted 3/21 with bowel perforation requiring colostomy and lower anterior resection in setting of newly diagnosed proximal rectal cancer with sigmoid perforation.  Post op developed septic shock, HCAP AKI ileus and acute hypoxemic respiratory failure requiring intubation.   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 Valeta Harms. Not on O2.  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, metastatic carcinoma in 3/15 lymph nodes  LE [name] 3/27 >> negative for DVT  ECHO 3/27 >> poor windows, normal LV function, severely dilated RV with moderately reduced function, RV volume and pressure overload, mildly dilated RA  V/Q Scan 3/30 >> low probability for PE  4/6 CT chest abdomen pelvis> 5.5x3.2 fluid collection along stomach, surgical drain again noted in pelvis, interval development of crescent shaped fluid collection measuring 16.4 x 3.3 in epigastric region and left  upper quadrant of abdomen (could be abscess) multiple other smaller fluid collections, colostomy noted, mild free fluid in posterior pelvis  Micro Data:  COVID 3/21 >> Negative MRSA PCR 3/21 >> Negative Blood cultures 3/27 >> negative  Tracheal Aspirate 3/29 >> Saccharomyces cerevisiae Trach aspirate 4/2 >> saccharomyces cerevisiae abscess abdomen 4/7 >> pseudomonas  Antimicrobials:  Zosyn 3/21 >> 3/29 Anidulafungin 3/31 >> 4/5 Meropenem 3/29 >>   Interim history/subjective:   On pressure support weans of 18/5 today.  Alert  Objective   Blood pressure 103/60, pulse 86, temperature 99.9 F (37.7 C), temperature source Axillary, resp. rate (!) 25, height 5\' 2"  (1.575 m), weight 69.5 kg, last menstrual period 12/14/2000, SpO2 95 %.    Vent Mode: PSV FiO2 (%):  [30 %] 30 % Set Rate:  [10 bmp] 10 bmp Vt Set:  [400 mL] 400 mL PEEP:  [5 cmH20] 5 cmH20 Pressure Support:  [14 cmH20-18 cmH20] 18 cmH20 Plateau Pressure:  [14 cmH20-18 cmH20] 14 cmH20   Intake/Output Summary (Last 24 hours) at 03/26/2020 0904 Last data filed at 03/26/2020 0818 Gross per 24 hour  Intake 5007.72 ml  Output 2922.5 ml  Net 2085.22 ml   Filed Weights   03/24/20 0442 03/25/20 0500 03/26/20 0323  Weight: 70 kg 70.3 kg 69.5 kg    Examination: Gen:      No acute distress HEENT:  EOMI, sclera anicteric Neck:     No masses; no thyromegaly Lungs:    Clear to auscultation bilaterally; normal respiratory effort CV:         Regular rate and  rhythm; no murmurs Abd:      + bowel sounds; soft, non-tender; no palpable masses, no distension Ext:    No edema; adequate peripheral perfusion Skin:      Warm and dry; no rash Neuro: Awake, responsive  Labs significant for creatinine 0.43, WBC 11.9 No new imaging  Resolved Hospital Problem list   AKI from ATN in setting of sepsis, Hypernatremia  Assessment & Plan:   Acute hypoxic respiratory failure. Failure to wean s/p tracheostomy. Severe COPD with  emphysema. Continue pressure support weans as tolerated We will need LTAC placement Continue Pulmicort, Brovana Intermittent chest x-ray Xanax PRN for anxiety  Bowel perforation/partial colectomy. Colon cancer. Moderate malnutrition. - post op care, nutrition per CCS  Abdominal abscess with Pseudomonas. Sepsis, present on admission. - drain placed 4/07 Can probably stop meropenem as she has received adequate days of antibiotics.  Hypokalemia, hypophosphatemia, hypomagnesemia. - f/u labs  Hypertension. - goal SBP < 140, DBP < 105  Anemia of critical and chronic illness. - f/u CBC - transfuse for Hb < 7 or significant bleeding  Hyperglycemia. - SSI  Best practice:  Diet: TPN DVT prophylaxis: sub q heparin  GI prophylaxis: protonix Mobility: bed rest Code Status: full Family Communication: Per primary team. Disposition: ICU  Labs    CMP Latest Ref Rng & Units 03/26/2020 03/25/2020 03/24/2020  Glucose 70 - 99 mg/dL 118(H) 123(H) 135(H)  BUN 8 - 23 mg/dL 45(H) 49(H) 54(H)  Creatinine 0.44 - 1.00 mg/dL 0.43(L) 0.42(L) 0.49  Sodium 135 - 145 mmol/L 139 138 140  Potassium 3.5 - 5.1 mmol/L 3.8 3.9 3.8  Chloride 98 - 111 mmol/L 109 104 106  CO2 22 - 32 mmol/L 24 24 28   Calcium 8.9 - 10.3 mg/dL 8.0(L) 8.2(L) 8.2(L)  Total Protein 6.5 - 8.1 g/dL - 5.2(L) 5.2(L)  Total Bilirubin 0.3 - 1.2 mg/dL - 0.3 0.6  Alkaline Phos 38 - 126 U/L - 182(H) 212(H)  AST 15 - 41 U/L - 60(H) 55(H)  ALT 0 - 44 U/L - 62(H) 45(H)    CBC Latest Ref Rng & Units 03/25/2020 03/23/2020 03/21/2020  WBC 4.0 - 10.5 K/uL 11.9(H) 14.9(H) 17.7(H)  Hemoglobin 12.0 - 15.0 g/dL 8.5(L) 8.3(L) 9.2(L)  Hematocrit 36.0 - 46.0 % 27.6(L) 27.4(L) 30.0(L)  Platelets 150 - 400 K/uL 341 334 390    ABG    Component Value Date/Time   PHART 7.398 03/16/2020 2245   PCO2ART 45.6 03/16/2020 2245   PO2ART 70.3 (L) 03/16/2020 2245   HCO3 27.7 03/16/2020 2245   TCO2 24 03/12/2020 1513   ACIDBASEDEF 4.8 (H) 03/12/2020  1738   O2SAT 93.5 03/16/2020 2245    CBG (last 3)  Recent Labs    03/25/20 2334 03/26/20 0322 03/26/20 0800  GLUCAP 122* 118* 142*    Signature:   The patient is critically ill with multiple organ system failure and requires high complexity decision making for assessment and support, frequent evaluation and titration of therapies, advanced monitoring, review of radiographic studies and interpretation of complex data.   Critical Care Time devoted to patient care services, exclusive of separately billable procedures, described in this note is 35 minutes.   Marshell Garfinkel MD [facility] Please see [email or web address] for pager details.  03/26/2020, 9:08 AM

## 2020-03-27 DIAGNOSIS — J9601 Acute respiratory failure with hypoxia: Secondary | ICD-10-CM | POA: Diagnosis not present

## 2020-03-27 LAB — GLUCOSE, CAPILLARY
Glucose-Capillary: 114 mg/dL — ABNORMAL HIGH (ref 70–99)
Glucose-Capillary: 126 mg/dL — ABNORMAL HIGH (ref 70–99)
Glucose-Capillary: 114 mg/dL — ABNORMAL HIGH (ref 70–99)
Glucose-Capillary: 128 mg/dL — ABNORMAL HIGH (ref 70–99)
Glucose-Capillary: 103 mg/dL — ABNORMAL HIGH (ref 70–99)
Glucose-Capillary: 119 mg/dL — ABNORMAL HIGH (ref 70–99)

## 2020-03-27 MED ORDER — PRO-STAT SUGAR FREE PO LIQD
30.0000 mL | Freq: Every day | ORAL | Status: DC
  Filled 2020-03-27: qty 30

## 2020-03-27 MED ORDER — VITAL HIGH PROTEIN PO LIQD
1000.0000 mL | ORAL | Status: DC
  Administered 2020-03-27 – 2020-03-31 (×5): 1000 mL

## 2020-03-27 MED ORDER — PRO-STAT SUGAR FREE PO LIQD
30.0000 mL | Freq: Every day | ORAL | Status: DC
  Administered 2020-03-27 – 2020-04-01 (×6): 30 mL
  Filled 2020-03-27 (×5): qty 30

## 2020-03-27 NOTE — Progress Notes (Signed)
[facility] Progress Note  15 Days Post-Op  Subjective: CC-  Husband at bedside. On pressure support this morning. Denies abdominal pain. Ostomy functioning. Tolerating tube feedings at goal. IR drains with 120cc and 110cc output last 24 hours.  Objective: Vital signs in last 24 hours: Temp:  [98 F (36.7 C)-99.8 F (37.7 C)] 98.1 F (36.7 C) (04/14 0800) Pulse Rate:  [81-100] 90 (04/14 0700) Resp:  [16-35] 23 (04/14 0700) BP: (81-116)/(26-83) 104/69 (04/14 0700) SpO2:  [97 %-100 %] 99 % (04/14 0734) FiO2 (%):  [30 %-100 %] 30 % (04/14 0805) Weight:  [68.8 kg] 68.8 kg (04/14 0500) Last BM Date: 03/27/20  Intake/Output from previous day: 04/13 0701 - 04/14 0700 In: 2319.8 [I.V.:589; NG/GT:1720.8] Out: 2745 [Urine:1100; Drains:245; [id number] Intake/Output this shift: No intake/output data recorded.  PE: [id number] HEENT: trach in place Pulm:Mechanically ventilated [id number], nondistended, open midline incision light pink with slough at base/ no significant drainage, ostomy viable withliquid stool in pouch. Surgical drain serous. IR drain x2 - no drainage currently in bulbs [id number] soft and nontender without edema Skin: warm and dry   Lab Results:  Recent Labs    03/25/20 0520  WBC 11.9*  HGB 8.5*  HCT 27.6*  PLT 341   BMET Recent Labs    03/25/20 0520 03/26/20 0343  NA 138 139  K 3.9 3.8  CL 104 109  CO2 24 24  GLUCOSE 123* 118*  BUN 49* 45*  CREATININE 0.42* 0.43*  CALCIUM 8.2* 8.0*   PT/INR No results for input(s): LABPROT, INR in the last 72 hours. CMP     Component Value Date/Time   NA 139 03/26/2020 0343   K 3.8 03/26/2020 0343   CL 109 03/26/2020 0343   CO2 24 03/26/2020 0343   GLUCOSE 118 (H) 03/26/2020 0343   BUN 45 (H) 03/26/2020 0343   CREATININE 0.43 (L) 03/26/2020 0343   CALCIUM 8.0 (L) 03/26/2020 0343   PROT 5.2 (L) 03/25/2020 0520   ALBUMIN 2.2 (L) 03/25/2020 0520   AST 60 (H) 03/25/2020 0520   ALT  62 (H) 03/25/2020 0520   ALKPHOS 182 (H) 03/25/2020 0520   BILITOT 0.3 03/25/2020 0520   GFRNONAA >60 03/26/2020 0343   GFRAA >60 03/26/2020 0343   Lipase     Component Value Date/Time   LIPASE 18 03/03/2020 1214       Studies/Results: No results found.  Anti-infectives: Anti-infectives (From admission, onward)   Start     Dose/Rate Route Frequency Ordered Stop   03/18/20 0900  meropenem (MERREM) 1 g in sodium chloride 0.9 % 100 mL IVPB  Status:  Discontinued     1 g 200 mL/hr over 30 Minutes Intravenous Every 8 hours 03/18/20 0838 03/26/20 1106   03/15/20 2200  meropenem (MERREM) 1 g in sodium chloride 0.9 % 100 mL IVPB  Status:  Discontinued     1 g 200 mL/hr over 30 Minutes Intravenous Every 12 hours 03/15/20 1021 03/18/20 0838   03/14/20 1000  anidulafungin (ERAXIS) 100 mg in sodium chloride 0.9 % 100 mL IVPB  Status:  Discontinued     100 mg 78 mL/hr over 100 Minutes Intravenous Every 24 hours 03/13/20 0752 03/18/20 0849   03/13/20 0900  anidulafungin (ERAXIS) 200 mg in sodium chloride 0.9 % 200 mL IVPB     200 mg 78 mL/hr over 200 Minutes Intravenous  Once 03/13/20 0752 03/13/20 1241   03/11/20 2000  meropenem (MERREM) 500 mg in sodium chloride 0.9 % 100  mL IVPB  Status:  Discontinued     500 mg 200 mL/hr over 30 Minutes Intravenous Every 12 hours 03/11/20 1658 03/15/20 1021   03/11/20 1200  piperacillin-tazobactam (ZOSYN) IVPB 2.25 g  Status:  Discontinued     2.25 g 100 mL/hr over 30 Minutes Intravenous Every 6 hours 03/11/20 1058 03/11/20 1641   03/06/20 1200  piperacillin-tazobactam (ZOSYN) IVPB 3.375 g  Status:  Discontinued     3.375 g 12.5 mL/hr over 240 Minutes Intravenous Every 8 hours 03/06/20 0739 03/11/20 1058   03/05/20 1400  piperacillin-tazobactam (ZOSYN) IVPB 2.25 g  Status:  Discontinued     2.25 g 100 mL/hr over 30 Minutes Intravenous Every 8 hours 03/05/20 0752 03/06/20 0739   03/04/20 2200  piperacillin-tazobactam (ZOSYN) IVPB 3.375 g  Status:   Discontinued     3.375 g 12.5 mL/hr over 240 Minutes Intravenous Every 8 hours 03/03/20 2032 03/05/20 0752   03/03/20 1545  cefoTEtan (CEFOTAN) 2 g in sodium chloride 0.9 % 100 mL IVPB     2 g 200 mL/hr over 30 Minutes Intravenous On call to O.R. 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 Marcello Moores 03/03/20 POD#17 S/p exploratory laparotomy and colon resection with transverse colostomy 3/31 Dr. Hassell Done -POD#85from second surgery. -BID wet to drydressing changesto midline abdominal wound - WOC teamfollowing for ostomy teaching - s/p IR drain x2 4/7,culturePSEUDOMONAS AERUGINOSA, completed course of meropenem  - ostomy viable and having bowel function, monitor output  FEN -NPO, TF at55cc/hr VTE - SCDs,SQ heparin ID - Zosyn3/21>3/29; Eraxis 3/31>>4/5,Merrem 3/29>>4/13 Foley -wick  Plan:Tube feedings at goal. Continue drains and monitor output, once volume decreases will plan repeat scan.  CCM working on vent wean as tolerated.   LOS: 24 days    [facility] 03/27/2020, 8:41 AM Please see Amion for pager number during day hours 7:00am-4:30pm

## 2020-03-27 NOTE — Progress Notes (Signed)
NAME:  Tracy [name], MRN:  [id number], DOB:  1951-03-21, LOS: 24 ADMISSION DATE:  03/03/2020, CONSULTATION DATE:  3/27  REFERRING MD:  Ninfa Linden, CHIEF COMPLAINT:  Abdominal pain   Brief History   69 y/o female admitted 3/21 with bowel perforation requiring colostomy and lower anterior resection in setting of newly diagnosed proximal rectal cancer with sigmoid perforation.  Post op developed septic shock, HCAP AKI ileus and acute hypoxemic respiratory failure requiring intubation.   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 Valeta Harms. Not on O2.  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, metastatic carcinoma in 3/15 lymph nodes  LE [name] 3/27 >> negative for DVT  ECHO 3/27 >> poor windows, normal LV function, severely dilated RV with moderately reduced function, RV volume and pressure overload, mildly dilated RA  V/Q Scan 3/30 >> low probability for PE  4/6 CT chest abdomen pelvis> 5.5x3.2 fluid collection along stomach, surgical drain again noted in pelvis, interval development of crescent shaped fluid collection measuring 16.4 x 3.3 in epigastric region and left upper quadrant of  abdomen (could be abscess) multiple other smaller fluid collections, colostomy noted, mild free fluid in posterior pelvis  Micro Data:  COVID 3/21 >> Negative MRSA PCR 3/21 >> Negative Blood cultures 3/27 >> negative  Tracheal Aspirate 3/29 >> Saccharomyces cerevisiae Trach aspirate 4/2 >> saccharomyces cerevisiae abscess abdomen 4/7 >> pseudomonas  Antimicrobials:  Zosyn 3/21 >> 3/29 Anidulafungin 3/31 >> 4/5 Meropenem 3/29 >> 4/13  Interim history/subjective:   Tolerating intermittent pressure support weans. No acute events.  Objective   Blood pressure 92/62, pulse 89, temperature 98.1 F (36.7 C), temperature source Axillary, resp. rate (!) 22, height 5\' 2"  (1.575 m), weight 68.8 kg, last menstrual period 12/14/2000, SpO2 99 %.    Vent Mode: PSV;CPAP FiO2 (%):  [30 %-100 %] 30 % Set Rate:  [10 bmp] 10 bmp Vt Set:  [400 mL] 400 mL PEEP:  [5 cmH20] 5 cmH20 Pressure Support:  [12 cmH20-18 cmH20] 12 cmH20 Plateau Pressure:  [10 cmH20-15 cmH20] 15 cmH20   Intake/Output Summary (Last 24 hours) at 03/27/2020 0955 Last data filed at 03/27/2020 0700 Gross per 24 hour  Intake 2224.77 ml  Output 2445 ml  Net -220.23 ml   Filed Weights   03/25/20 0500 03/26/20 0323 03/27/20 0500  Weight: 70.3 kg 69.5 kg 68.8 kg    Examination: Gen:      No acute distress HEENT:  EOMI, sclera anicteric Neck:     No masses; no thyromegaly, trach Lungs:    Clear to auscultation bilaterally; normal respiratory effort CV:         Regular rate and rhythm; no  murmurs Abd:      + bowel sounds; soft, non-tender; no palpable masses, no distension Ext:    No edema; adequate peripheral perfusion Skin:      Warm and dry; no rash Neuro: [facility] Problem list   AKI from ATN in setting of sepsis, Hypernatremia  Assessment & Plan:   Acute hypoxic respiratory failure. Failure to wean s/p tracheostomy. Severe COPD with emphysema. Continue pressure support weans as tolerated Will  need LTACH placement Continue Pulmicort, Brovana Intermittent chest x-ray Xanax PRN for anxiety  Bowel perforation/partial colectomy. Colon cancer. Moderate malnutrition. Post op care, nutrition per CCS Tube feeds. Off TPN  Abdominal abscess with Pseudomonas. Sepsis, present on admission. - drain placed 4/07 Finished course of meropenem   Hypertension. - goal SBP < 140, DBP < 105  Anemia of critical and chronic illness. - f/u CBC - transfuse for Hb < 7 or significant bleeding  Hyperglycemia. - SSI  Best practice:  Diet: tube feeds DVT prophylaxis: sub q heparin  GI prophylaxis: protonix Mobility: bed rest Code Status: full Family Communication: Per primary team. Disposition: ICU  Labs    CMP Latest Ref Rng & Units 03/26/2020 03/25/2020 03/24/2020  Glucose 70 - 99 mg/dL 118(H) 123(H) 135(H)  BUN 8 - 23 mg/dL 45(H) 49(H) 54(H)  Creatinine 0.44 - 1.00 mg/dL 0.43(L) 0.42(L) 0.49  Sodium 135 - 145 mmol/L 139 138 140  Potassium 3.5 - 5.1 mmol/L 3.8 3.9 3.8  Chloride 98 - 111 mmol/L 109 104 106  CO2 22 - 32 mmol/L 24 24 28   Calcium 8.9 - 10.3 mg/dL 8.0(L) 8.2(L) 8.2(L)  Total Protein 6.5 - 8.1 g/dL - 5.2(L) 5.2(L)  Total Bilirubin 0.3 - 1.2 mg/dL - 0.3 0.6  Alkaline Phos 38 - 126 U/L - 182(H) 212(H)  AST 15 - 41 U/L - 60(H) 55(H)  ALT 0 - 44 U/L - 62(H) 45(H)    CBC Latest Ref Rng & Units 03/25/2020 03/23/2020 03/21/2020  WBC 4.0 - 10.5 K/uL 11.9(H) 14.9(H) 17.7(H)  Hemoglobin 12.0 - 15.0 g/dL 8.5(L) 8.3(L) 9.2(L)  Hematocrit 36.0 - 46.0 % 27.6(L) 27.4(L) 30.0(L)  Platelets 150 - 400 K/uL 341 334 390    ABG    Component Value Date/Time   PHART 7.398 03/16/2020 2245   PCO2ART 45.6 03/16/2020 2245   PO2ART 70.3 (L) 03/16/2020 2245   HCO3 27.7 03/16/2020 2245   TCO2 24 03/12/2020 1513   ACIDBASEDEF 4.8 (H) 03/12/2020 1738   O2SAT 93.5 03/16/2020 2245    CBG (last 3)  Recent Labs    03/26/20 2353 03/27/20 0352 03/27/20 0732  GLUCAP 73 114* 126*     Signature:    Marshell Garfinkel MD [facility] Please see [email or web address] for pager details.  03/27/2020, 9:55 AM

## 2020-03-27 NOTE — Progress Notes (Signed)
Nutrition Follow-up  DOCUMENTATION CODES:   Not applicable  INTERVENTION:  - Vital High Protein @ 60 ml/hr with 30 ml prostat once/day. - this regimen will provide 1540 kcal (102% estimated kcal need), 141 grams protein, and 1204 ml free water.  - free water flush to continue to be per MD/NP.   NUTRITION DIAGNOSIS:   Inadequate oral intake related to inability to eat as evidenced by NPO status. -ongoing  GOAL:   Patient will meet greater than or equal to 90% of their needs -met with TF regimen  MONITOR:   Vent status, TF tolerance, Labs, Weight trends  ASSESSMENT:   69 year-old female with medical history of asthma, COPD, hemorrhoids, osteopenia, vitamin D deficiency, and seasonal allergies. She presented to the ED on 3/20 due to abdominal pain with associated nausea and diarrhea. These symptoms have been ongoing for several months.  Significant Events: 3/21- admission; emergent low anterior resection/colostomy formation 3/27- intubation and OGT placement 3/30- ex lap with colon resection and transverse colostomy 4/1- TPN initiation 4/6- double lumen PICC placed in R brachial 4/7- tracheostomy; OGT removed 4/8- R nare NGT placed  4/13- TPN discontinued   Patient remains intubated via trach. NGT in R nare and she is receiving Vital High Protein @ 55 ml/hr with 200 ml free water every 4 hours. This regimen is providing 1320 kcal, 115 grams protein, and 1103 ml free water. Will adjust TF regimen as outlined above to better meet estimated needs.   Weight remains elevated and is currently +5.8 kg/13 lb since admission on 3/21. Flow sheet documentation indicates deep pitting edema to BUE, moderate pitting edema to BLE.   Per notes: - acute hypoxic respiratory failure s/p trach - severe COPD with emphysema - bowel perforation/partial colectomy d/t colon cancer - abdominal abscess s/p drain placement 4/7 - anemia of critical and chronic illness   Patient is currently  intubated on ventilator support MV: 10.7 L/min Temp (24hrs), Avg:98.6 F (37 C), Min:98 F (36.7 C), Max:99.8 F (37.7 C) Propofol: none BP: 87/56 and MAP: 66   Labs reviewed; CBGsL 114, 126, 114 mg/dl, BUN: 45 mg/dl, creatinine: 0.43 mg/dl, Ca: 8 mg/dl. Medications reviewed; sliding scale novolog, 40 mg IV protonix/day.    Diet Order:   Diet Order            Diet NPO time specified  Diet effective midnight              EDUCATION NEEDS:   No education needs have been identified at this time  Skin:  Skin Assessment: Skin Integrity Issues: Skin Integrity Issues:: Incisions Incisions: abdomen (3/21 and 3/30)  Last BM:  4/14  Height:   Ht Readings from Last 1 Encounters:  03/09/20 5' 2"  (1.575 m)    Weight:   Wt Readings from Last 1 Encounters:  03/27/20 68.8 kg    Ideal Body Weight:  50 kg  BMI:  Body mass index is 27.74 kg/m.  Estimated Nutritional Needs:   Kcal:  1515 kcal  Protein:  127-140  Fluid:  >/= 1.5 L/day     Jarome Matin, MS, RD, LDN, CNSC Inpatient Clinical Dietitian RD pager # available in AMION  After hours/weekend pager # available in [facility]

## 2020-03-27 NOTE — Progress Notes (Signed)
Referring Physician(s): Byerly,F  Supervising Physician: Daryll Brod  Patient Status:  [facility] - In-pt  Chief Complaint:  Abdominal fluid collections  Subjective: Patient without acute changes; remains on vent, pressure support   Allergies: Codeine  Medications: Prior to Admission medications   Medication Sig Start Date End Date Taking? Authorizing Provider  acetaminophen (TYLENOL) 325 MG tablet Take 650 mg by mouth every 6 (six) hours as needed for moderate pain.   Yes [provider]  albuterol (PROVENTIL) (2.5 MG/3ML) 0.083% nebulizer solution INHALE 3ML VIA NEBULIZER EVERY 4 HOURS AS NEEDED FOR SHORTNESS OF BREATH 11/23/19  Yes Icard, Bradley L, DO  b complex vitamins tablet Take 1 tablet by mouth daily.   Yes [provider]  Fluticasone-Umeclidin-Vilant (TRELEGY ELLIPTA) 100-62.5-25 MCG/INH AEPB Inhale 1 puff into the lungs daily. 02/15/20  Yes Icard, Bradley L, DO  VITAMIN D, CHOLECALCIFEROL, PO Take 1 tablet by mouth daily.   Yes [provider]     Vital Signs: BP 121/66   Pulse 85   Temp 98.3 F (36.8 C) (Axillary)   Resp 16   Ht 5\' 2"  (1.575 m)   Wt 151 lb 10.8 oz (68.8 kg)   LMP 12/14/2000   SpO2 99%   BMI 27.74 kg/m   Physical Exam patient awake, on vent; left abdominal drains intact, outputs ranging from 110 to 120 cc beige to slightly blood-tinged fluid  Imaging: DG Chest Port 1 View  Result Date: 03/25/2020 CLINICAL DATA:  Respiratory failure EXAM: PORTABLE CHEST 1 VIEW COMPARISON:  March 22, 2020 FINDINGS: Tracheostomy catheter tip is 5.7 cm above the carina. Central catheter tip is in the superior vena cava. Nasogastric tube tip and side port are below the diaphragm. No pneumothorax. There is underlying emphysematous change. No edema or airspace opacity. The heart size is normal. Pulmonary vascularity is stable and reflects the underlying emphysematous change. No adenopathy. No bone lesions. IMPRESSION: Tube and catheter  positions as described without pneumothorax. No edema or airspace opacity. Stable cardiac silhouette. Emphysema (ICD10-J43.9). Electronically Signed   By: Lowella Grip III M.D.   On: 03/25/2020 07:58   DG Abd Portable 1V  Result Date: 03/24/2020 CLINICAL DATA:  Post NG tube placement EXAM: PORTABLE ABDOMEN - 1 VIEW COMPARISON:  03/21/2020 FINDINGS: Gastric tube terminating in distal body or gastric antrum. Pigtail drainage catheter is along the left flank. Limited gas the abdomen with nonspecific bowel gas pattern. Rectum not imaged. Lung bases are clear. Spinal degenerative changes. IMPRESSION: 1. Gastric tube terminating in the distal body or gastric antrum. 2. Pigtail drainage catheter along the left flank. Electronically Signed   By: Zetta Bills M.D.   On: 03/24/2020 12:38    Labs:  CBC: Recent Labs    03/19/20 0415 03/21/20 0327 03/23/20 0635 03/25/20 0520  WBC 18.4* 17.7* 14.9* 11.9*  HGB 8.8* 9.2* 8.3* 8.5*  HCT 28.9* 30.0* 27.4* 27.6*  PLT 450* 390 334 341    COAGS: Recent Labs    03/09/20 1752  INR 1.3*    BMP: Recent Labs    03/23/20 0635 03/24/20 0430 03/25/20 0520 03/26/20 0343  NA 142 140 138 139  K 2.6* 3.8 3.9 3.8  CL 101 106 104 109  CO2 29 28 24 24   GLUCOSE 153* 135* 123* 118*  BUN 64* 54* 49* 45*  CALCIUM 8.5* 8.2* 8.2* 8.0*  CREATININE 0.54 0.49 0.42* 0.43*  GFRNONAA >60 >60 >60 >60  GFRAA >60 >60 >60 >60    LIVER FUNCTION  TESTS: Recent Labs    03/22/20 0427 03/23/20 0635 03/24/20 0430 03/25/20 0520  BILITOT 0.5 0.6 0.6 0.3  AST 28 44* 55* 60*  ALT 27 36 45* 62*  ALKPHOS 84 167* 212* 182*  PROT 4.9* 5.0* 5.2* 5.2*  ALBUMIN 2.4* 2.3* 2.2* 2.2*    Assessment and Plan: Patient with history of perforated sigmoid colon, proximal rectal cancer with prior LAR/colon resection/colostomy; post opleft abdominal fluid collections, status post drain placement x2 on 4/7; temp 99, significant output cont from left abdominal drains; we will  plan follow-up CT once outputs minimal or if clinical status worsens.  Other plans as per surgery/critical care   Electronically Signed: D. Rowe Robert, PA-C 03/27/2020, 5:12 PM   I spent a total of 15 minutes  at the the patient's bedside AND on the patient's hospital floor or unit, greater than 50% of which was counseling/coordinating care for left abdominal fluid collection/abscess drains    Patient ID: Tracy [name], female   DOB: 08/23/1951, 69 y.o.   MRN: [id number]

## 2020-03-28 DIAGNOSIS — J9601 Acute respiratory failure with hypoxia: Secondary | ICD-10-CM | POA: Diagnosis not present

## 2020-03-28 LAB — GLUCOSE, CAPILLARY
Glucose-Capillary: 118 mg/dL — ABNORMAL HIGH (ref 70–99)
Glucose-Capillary: 120 mg/dL — ABNORMAL HIGH (ref 70–99)
Glucose-Capillary: 132 mg/dL — ABNORMAL HIGH (ref 70–99)
Glucose-Capillary: 100 mg/dL — ABNORMAL HIGH (ref 70–99)
Glucose-Capillary: 109 mg/dL — ABNORMAL HIGH (ref 70–99)
Glucose-Capillary: 125 mg/dL — ABNORMAL HIGH (ref 70–99)

## 2020-03-28 LAB — COMPREHENSIVE METABOLIC PANEL
ALT: 67 U/L — ABNORMAL HIGH (ref 0–44)
AST: 38 U/L (ref 15–41)
Albumin: 2 g/dL — ABNORMAL LOW (ref 3.5–5.0)
Alkaline Phosphatase: 274 U/L — ABNORMAL HIGH (ref 38–126)
Anion gap: 7 (ref 5–15)
BUN: 37 mg/dL — ABNORMAL HIGH (ref 8–23)
CO2: 24 mmol/L (ref 22–32)
Calcium: 8 mg/dL — ABNORMAL LOW (ref 8.9–10.3)
Chloride: 110 mmol/L (ref 98–111)
Creatinine, Ser: 0.43 mg/dL — ABNORMAL LOW (ref 0.44–1.00)
GFR calc Af Amer: >60 mL/min (ref >60)
GFR calc non Af Amer: >60 mL/min (ref >60)
Glucose, Bld: 127 mg/dL — ABNORMAL HIGH (ref 70–99)
Potassium: 3.7 mmol/L (ref 3.5–5.1)
Sodium: 141 mmol/L (ref 135–145)
Total Bilirubin: 0.7 mg/dL (ref 0.3–1.2)
Total Protein: 5.1 g/dL — ABNORMAL LOW (ref 6.5–8.1)

## 2020-03-28 LAB — MAGNESIUM: Magnesium: 1.8 mg/dL (ref 1.7–2.4)

## 2020-03-28 LAB — CBC
HCT: 25 % — ABNORMAL LOW (ref 36.0–46.0)
Hemoglobin: 7.5 g/dL — ABNORMAL LOW (ref 12.0–15.0)
MCH: 30.2 pg (ref 26.0–34.0)
MCHC: 30 g/dL (ref 30.0–36.0)
MCV: 100.8 fL — ABNORMAL HIGH (ref 80.0–100.0)
Platelets: 351 10*3/uL (ref 150–400)
RBC: 2.48 MIL/uL — ABNORMAL LOW (ref 3.87–5.11)
RDW: 16.4 % — ABNORMAL HIGH (ref 11.5–15.5)
WBC: 7.1 10*3/uL (ref 4.0–10.5)
nRBC: 0 % (ref 0.0–0.2)

## 2020-03-28 LAB — PHOSPHORUS: Phosphorus: 2.3 mg/dL — ABNORMAL LOW (ref 2.5–4.6)

## 2020-03-28 MED ORDER — SODIUM PHOSPHATES 45 MMOLE/15ML IV SOLN
20.0000 mmol | Freq: Once | INTRAVENOUS | Status: AC
  Administered 2020-03-28: 20 mmol via INTRAVENOUS
  Filled 2020-03-28: qty 6.67

## 2020-03-28 MED ORDER — MAGNESIUM SULFATE IN D5W 1-5 GM/100ML-% IV SOLN
1.0000 g | Freq: Once | INTRAVENOUS | Status: AC
  Administered 2020-03-28: 1 g via INTRAVENOUS
  Filled 2020-03-28: qty 100

## 2020-03-28 NOTE — Progress Notes (Signed)
Referring Physician(s): Earnstine Regal ([place])  Supervising Physician: Daryll Brod  Patient Status:  [facility] - In-pt  Chief Complaint: None- tracheostomy  Subjective:  History of newly diagnosed proximal rectal cancer with sigmoid perforation s/p emergent low anterior resection/colostomy in OR 03/03/2020 by Dr. Marcello Moores; s/p exploratory laparotomy and colon resection with transverse colostomy in OR 03/12/2020 by Dr. Hassell Done; complicated by intraabdominal fluid collections s/p LUQ and LLQ drain placements in IR 03/20/2020 by Dr. Annamaria Boots. Patient awake sitting in bed, tracheostomy in place. Eyes follow around room. Accompanied by husband at bedside. LUQ and LLQ drain sites c/d/i.   Allergies: Codeine  Medications: Prior to Admission medications   Medication Sig Start Date End Date Taking? Authorizing Provider  acetaminophen (TYLENOL) 325 MG tablet Take 650 mg by mouth every 6 (six) hours as needed for moderate pain.   Yes [provider]  albuterol (PROVENTIL) (2.5 MG/3ML) 0.083% nebulizer solution INHALE 3ML VIA NEBULIZER EVERY 4 HOURS AS NEEDED FOR SHORTNESS OF BREATH 11/23/19  Yes Icard, Bradley L, DO  b complex vitamins tablet Take 1 tablet by mouth daily.   Yes [provider]  Fluticasone-Umeclidin-Vilant (TRELEGY ELLIPTA) 100-62.5-25 MCG/INH AEPB Inhale 1 puff into the lungs daily. 02/15/20  Yes Icard, Bradley L, DO  VITAMIN D, CHOLECALCIFEROL, PO Take 1 tablet by mouth daily.   Yes [provider]     Vital Signs: BP 134/65   Pulse 85   Temp 99.9 F (37.7 C) (Axillary)   Resp (!) 30   Ht 5\' 2"  (1.575 m)   Wt 151 lb 3.8 oz (68.6 kg)   LMP 12/14/2000   SpO2 100%   BMI 27.66 kg/m   Physical Exam Vitals and nursing note reviewed.  Constitutional:      General: She is not in acute distress.    Comments: Tracheostomy.  Pulmonary:     Effort: Pulmonary effort is normal. No respiratory distress.     Comments: Tracheostomy. Abdominal:   Comments: LUQ drain (labeled #2)  And LLQ drain (labeled #1) sites without tenderness, erythema, drainage, or active bleeding; LUQ drain (labeled #2) with approximately 10 cc clear yellow fluid in suction bulb; LLQ drain (labeled #1) with approximately 15 cc clear yellow fluid in suction bulb.  Skin:    General: Skin is warm and dry.  Neurological:     Mental Status: She is alert.     Imaging: DG Chest Port 1 View  Result Date: 03/25/2020 CLINICAL DATA:  Respiratory failure EXAM: PORTABLE CHEST 1 VIEW COMPARISON:  March 22, 2020 FINDINGS: Tracheostomy catheter tip is 5.7 cm above the carina. Central catheter tip is in the superior vena cava. Nasogastric tube tip and side port are below the diaphragm. No pneumothorax. There is underlying emphysematous change. No edema or airspace opacity. The heart size is normal. Pulmonary vascularity is stable and reflects the underlying emphysematous change. No adenopathy. No bone lesions. IMPRESSION: Tube and catheter positions as described without pneumothorax. No edema or airspace opacity. Stable cardiac silhouette. Emphysema (ICD10-J43.9). Electronically Signed   By: Lowella Grip III M.D.   On: 03/25/2020 07:58   DG Abd Portable 1V  Result Date: 03/24/2020 CLINICAL DATA:  Post NG tube placement EXAM: PORTABLE ABDOMEN - 1 VIEW COMPARISON:  03/21/2020 FINDINGS: Gastric tube terminating in distal body or gastric antrum. Pigtail drainage catheter is along the left flank. Limited gas the abdomen with nonspecific bowel gas pattern. Rectum not imaged. Lung bases are clear. Spinal degenerative changes. IMPRESSION: 1. Gastric tube terminating in  the distal body or gastric antrum. 2. Pigtail drainage catheter along the left flank. Electronically Signed   By: Zetta Bills M.D.   On: 03/24/2020 12:38    Labs:  CBC: Recent Labs    03/19/20 0415 03/21/20 0327 03/23/20 0635 03/25/20 0520  WBC 18.4* 17.7* 14.9* 11.9*  HGB 8.8* 9.2* 8.3* 8.5*  HCT 28.9* 30.0*  27.4* 27.6*  PLT 450* 390 334 341    COAGS: Recent Labs    03/09/20 1752  INR 1.3*    BMP: Recent Labs    03/24/20 0430 03/25/20 0520 03/26/20 0343 03/28/20 0551  NA 140 138 139 141  K 3.8 3.9 3.8 3.7  CL 106 104 109 110  CO2 28 24 24 24   GLUCOSE 135* 123* 118* 127*  BUN 54* 49* 45* 37*  CALCIUM 8.2* 8.2* 8.0* 8.0*  CREATININE 0.49 0.42* 0.43* 0.43*  GFRNONAA >60 >60 >60 >60  GFRAA >60 >60 >60 >60    LIVER FUNCTION TESTS: Recent Labs    03/23/20 0635 03/24/20 0430 03/25/20 0520 03/28/20 0551  BILITOT 0.6 0.6 0.3 0.7  AST 44* 55* 60* 38  ALT 36 45* 62* 67*  ALKPHOS 167* 212* 182* 274*  PROT 5.0* 5.2* 5.2* 5.1*  ALBUMIN 2.3* 2.2* 2.2* 2.0*    Assessment and Plan:  History of newly diagnosed proximal rectal cancer with sigmoid perforation s/p emergent low anterior resection/colostomy in OR 03/03/2020 by Dr. Marcello Moores; s/p exploratory laparotomy and colon resection with transverse colostomy in OR 03/12/2020 by Dr. Hassell Done; complicated by intraabdominal fluid collections s/p LUQ and LLQ drain placements in IR 03/20/2020 by Dr. Annamaria Boots. LUQ drain (labeled #2) stable with approximately 10 cc clear yellow fluid in suction bulb (additional 35 cc output from drain in past 24 hours per chart). LLQ drain (labeled #1) stable with approximately 15 cc clear yellow fluid in suction bulb (additional 165 cc output from drain in past 24 hours per chart). Continue current drain management- continue with Qshift flushes/monitor of output. Plan for repeat CT/possible drain injection when output <10 cc/day (assess for possible removal). Further plans per CCS/CCM- appreciate and agree with management. IR to follow.   Electronically Signed: Earley Abide, PA-C 03/28/2020, 9:14 AM   I spent a total of 25 Minutes at the the patient's bedside AND on the patient's hospital floor or unit, greater than 50% of which was counseling/coordinating care for intraabdominal fluid collections s/p LUQ and  LLQ drain placements.

## 2020-03-28 NOTE — Progress Notes (Signed)
[facility] Progress Note  16 Days Post-Op  Subjective: CC-  Husband at bedside. Tolerating intermittent pressure support weans. Tolerating tube feedings and ostomy functioning. Denies abdominal pain. Low grade temp yesterday 100.1, CBC pending  Objective: Vital signs in last 24 hours: Temp:  [98.2 F (36.8 C)-100.1 F (37.8 C)] 98.2 F (36.8 C) (04/15 0318) Pulse Rate:  [81-97] 88 (04/15 0700) Resp:  [14-30] 23 (04/15 0700) BP: (87-134)/(46-71) 134/65 (04/15 0700) SpO2:  [96 %-100 %] 99 % (04/15 0700) FiO2 (%):  [30 %] 30 % (04/15 0400) Weight:  [68.6 kg] 68.6 kg (04/15 0500) Last BM Date: 03/28/20  Intake/Output from previous day: 04/14 0701 - 04/15 0700 In: 244.7 [I.V.:229.7] Out: 2020 [Urine:1150; Drains:205; Stool:665] Intake/Output this shift: No intake/output data recorded.  PE: [id number] HEENT: trach in place Pulm:Mechanically ventilated [id number], open midline incision light pink with slough at base/ no significant drainage, ostomy viable withliquid stool in pouch. Surgical drain serous. IR drain x2 -only trace fluid in bulbs but both appear more serous Skin: warm and dry   Lab Results:  No results for input(s): WBC, HGB, HCT, PLT in the last 72 hours. BMET Recent Labs    03/26/20 0343 03/28/20 0551  NA 139 141  K 3.8 3.7  CL 109 110  CO2 24 24  GLUCOSE 118* 127*  BUN 45* 37*  CREATININE 0.43* 0.43*  CALCIUM 8.0* 8.0*   PT/INR No results for input(s): LABPROT, INR in the last 72 hours. CMP     Component Value Date/Time   NA 141 03/28/2020 0551   K 3.7 03/28/2020 0551   CL 110 03/28/2020 0551   CO2 24 03/28/2020 0551   GLUCOSE 127 (H) 03/28/2020 0551   BUN 37 (H) 03/28/2020 0551   CREATININE 0.43 (L) 03/28/2020 0551   CALCIUM 8.0 (L) 03/28/2020 0551   PROT 5.1 (L) 03/28/2020 0551   ALBUMIN 2.0 (L) 03/28/2020 0551   AST 38 03/28/2020 0551   ALT 67 (H) 03/28/2020 0551   ALKPHOS 274 (H) 03/28/2020 0551   BILITOT 0.7 03/28/2020 0551   GFRNONAA >60 03/28/2020 0551   GFRAA >60 03/28/2020 0551   Lipase     Component Value Date/Time   LIPASE 18 03/03/2020 1214       Studies/Results: No results found.  Anti-infectives: Anti-infectives (From admission, onward)   Start     Dose/Rate Route Frequency Ordered Stop   03/18/20 0900  meropenem (MERREM) 1 g in sodium chloride 0.9 % 100 mL IVPB  Status:  Discontinued     1 g 200 mL/hr over 30 Minutes Intravenous Every 8 hours 03/18/20 0838 03/26/20 1106   03/15/20 2200  meropenem (MERREM) 1 g in sodium chloride 0.9 % 100 mL IVPB  Status:  Discontinued     1 g 200 mL/hr over 30 Minutes Intravenous Every 12 hours 03/15/20 1021 03/18/20 0838   03/14/20 1000  anidulafungin (ERAXIS) 100 mg in sodium chloride 0.9 % 100 mL IVPB  Status:  Discontinued     100 mg 78 mL/hr over 100 Minutes Intravenous Every 24 hours 03/13/20 0752 03/18/20 0849   03/13/20 0900  anidulafungin (ERAXIS) 200 mg in sodium chloride 0.9 % 200 mL IVPB     200 mg 78 mL/hr over 200 Minutes Intravenous  Once 03/13/20 0752 03/13/20 1241   03/11/20 2000  meropenem (MERREM) 500 mg in sodium chloride 0.9 % 100 mL IVPB  Status:  Discontinued     500 mg 200 mL/hr over 30 Minutes Intravenous Every  12 hours 03/11/20 1658 03/15/20 1021   03/11/20 1200  piperacillin-tazobactam (ZOSYN) IVPB 2.25 g  Status:  Discontinued     2.25 g 100 mL/hr over 30 Minutes Intravenous Every 6 hours 03/11/20 1058 03/11/20 1641   03/06/20 1200  piperacillin-tazobactam (ZOSYN) IVPB 3.375 g  Status:  Discontinued     3.375 g 12.5 mL/hr over 240 Minutes Intravenous Every 8 hours 03/06/20 0739 03/11/20 1058   03/05/20 1400  piperacillin-tazobactam (ZOSYN) IVPB 2.25 g  Status:  Discontinued     2.25 g 100 mL/hr over 30 Minutes Intravenous Every 8 hours 03/05/20 0752 03/06/20 0739   03/04/20 2200  piperacillin-tazobactam (ZOSYN) IVPB 3.375 g  Status:  Discontinued     3.375 g 12.5 mL/hr over 240 Minutes  Intravenous Every 8 hours 03/03/20 2032 03/05/20 0752   03/03/20 1545  cefoTEtan (CEFOTAN) 2 g in sodium chloride 0.9 % 100 mL IVPB     2 g 200 mL/hr over 30 Minutes Intravenous On call to O.R. 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 Marcello Moores 03/03/20 POD#17 S/p exploratory laparotomy and colon resection with transverse colostomy 3/31 Dr. Hassell Done -POD#62from second surgery. -BID wet to drydressing changesto midline abdominal wound - WOC teamfollowing for ostomy teaching - s/p IR drain x2 4/7,culturePSEUDOMONAS AERUGINOSA, completed course of meropenem  - ostomy viable and having bowel function  FEN -NPO, TF at55cc/hr VTE - SCDs,SQ heparin ID - Zosyn3/21>3/29; Eraxis 3/31>>4/5,Merrem 3/29>>4/13 Foley -wick  Plan:CBC pending. Tube feedings at goal. Continue drainsand monitor output, once volume decreases will plan repeat scan.  TOC team following for LTACH placement.   LOS: 25 days    [facility] 03/28/2020, 8:05 AM Please see Amion for pager number during day hours 7:00am-4:30pm

## 2020-03-28 NOTE — Progress Notes (Signed)
[facility] Progress Note Patient Name: Tracy [name] DOB: 02-27-1951 MRN: [id number]   Date of Service  03/28/2020  HPI/Events of Note  Hypophosphatemia/Hypomagnesemia - PO4--- = 2.3, Mg++ = 1.8 and Creatinine = 0.43.  eICU Interventions  Will replace Mg++ and PO4---.      Intervention Category Major Interventions: Electrolyte abnormality - evaluation and management  Macarena Langseth Eugene 03/28/2020, 6:58 AM

## 2020-03-28 NOTE — Progress Notes (Signed)
NAME:  Tracy [name], MRN:  [id number], DOB:  1951/01/15, LOS: 78 ADMISSION DATE:  03/03/2020, CONSULTATION DATE:  3/27  REFERRING MD:  Ninfa Linden, CHIEF COMPLAINT:  Abdominal pain   Brief History   69 y/o female admitted 3/21 with bowel perforation requiring colostomy and lower anterior resection in setting of newly diagnosed proximal rectal cancer with sigmoid perforation.  Post op developed septic shock, HCAP AKI ileus and acute hypoxemic respiratory failure requiring intubation.   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 Valeta Harms. Not on O2.  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, metastatic carcinoma in 3/15 lymph nodes  LE [name] 3/27 >> negative for DVT  ECHO 3/27 >> poor windows, normal LV function, severely dilated RV with moderately reduced function, RV volume and pressure overload, mildly dilated RA  V/Q Scan 3/30 >> low probability for PE  4/6 CT chest abdomen pelvis> 5.5x3.2 fluid collection along stomach, surgical drain again noted in pelvis, interval development of crescent shaped fluid collection measuring 16.4 x 3.3 in epigastric region and left upper quadrant of  abdomen (could be abscess) multiple other smaller fluid collections, colostomy noted, mild free fluid in posterior pelvis  Micro Data:  COVID 3/21 >> Negative MRSA PCR 3/21 >> Negative Blood cultures 3/27 >> negative  Tracheal Aspirate 3/29 >> Saccharomyces cerevisiae Trach aspirate 4/2 >> saccharomyces cerevisiae abscess abdomen 4/7 >> pseudomonas  Antimicrobials:  Zosyn 3/21 >> 3/29 Anidulafungin 3/31 >> 4/5 Meropenem 3/29 >> 4/13  Interim history/subjective:   No acute events. Tolerating PSV weans  Objective   Blood pressure 134/65, pulse 85, temperature 99.9 F (37.7 C), temperature source Axillary, resp. rate (!) 30, height 5\' 2"  (1.575 m), weight 68.6 kg, last menstrual period 12/14/2000, SpO2 100 %.    Vent Mode: PRVC FiO2 (%):  [30 %] 30 % Set Rate:  [10 bmp] 10 bmp Vt Set:  [400 mL] 400 mL PEEP:  [5 cmH20] 5 cmH20 Pressure Support:  [12 cmH20] 12 cmH20 Plateau Pressure:  [12 cmH20-15 cmH20] 12 cmH20   Intake/Output Summary (Last 24 hours) at 03/28/2020 0936 Last data filed at 03/28/2020 0846 Gross per 24 hour  Intake 362.3 ml  Output 2020 ml  Net -1657.7 ml   Filed Weights   03/26/20 0323 03/27/20 0500 03/28/20 0500  Weight: 69.5 kg 68.8 kg 68.6 kg    Examination: Gen:      No acute distress HEENT:  EOMI, sclera anicteric Neck:     No masses; no thyromegaly, ETT Lungs:    Clear to auscultation bilaterally; normal respiratory effort CV:         Regular rate and rhythm; no murmurs Abd:      +  bowel sounds; soft, non-tender; no palpable masses, no distension Ext:    No edema; adequate peripheral perfusion Skin:      Warm and dry; no rash Neuro: Awake, nods to questions  Resolved Hospital Problem list   AKI from ATN in setting of sepsis, Hypernatremia  Assessment & Plan:   Acute hypoxic respiratory failure. Failure to wean s/p tracheostomy. Severe COPD with emphysema. Continue pressure support weans as tolerated Will need LTACH placement Continue  Pulmicort, Brovana Intermittent chest x-ray Xanax PRN for anxiety  Bowel perforation/partial colectomy. Colon cancer. Moderate malnutrition. Post op care, nutrition per CCS Tube feeds. Off TPN  Abdominal abscess with Pseudomonas. Sepsis, present on admission. - drain placed 4/07 Finished course of meropenem  Hypertension. - goal SBP < 140, DBP < 105  Anemia of critical and chronic illness. - f/u CBC - transfuse for Hb < 7 or significant bleeding  Hyperglycemia. - SSI  Best practice:  Diet: tube feeds DVT prophylaxis: sub q heparin  GI prophylaxis: protonix Mobility: bed rest Code Status: full Family Communication: Per primary team. Disposition: ICU  Signature:   Marshell Garfinkel MD [facility] Please see [email or web address] for pager details.  03/28/2020, 9:36 AM

## 2020-03-29 DIAGNOSIS — R579 Shock, unspecified: Secondary | ICD-10-CM | POA: Diagnosis not present

## 2020-03-29 DIAGNOSIS — K631 Perforation of intestine (nontraumatic): Secondary | ICD-10-CM | POA: Diagnosis not present

## 2020-03-29 DIAGNOSIS — J9601 Acute respiratory failure with hypoxia: Secondary | ICD-10-CM | POA: Diagnosis not present

## 2020-03-29 LAB — GLUCOSE, CAPILLARY
Glucose-Capillary: 115 mg/dL — ABNORMAL HIGH (ref 70–99)
Glucose-Capillary: 118 mg/dL — ABNORMAL HIGH (ref 70–99)
Glucose-Capillary: 135 mg/dL — ABNORMAL HIGH (ref 70–99)
Glucose-Capillary: 108 mg/dL — ABNORMAL HIGH (ref 70–99)
Glucose-Capillary: 119 mg/dL — ABNORMAL HIGH (ref 70–99)
Glucose-Capillary: 127 mg/dL — ABNORMAL HIGH (ref 70–99)

## 2020-03-29 NOTE — Progress Notes (Signed)
RT called to assess patient for increased WOB and desaturation. Upon arrival, patient tachypneic with increased WOB. Patient lavaged and suctioned; dime size plug was obtained. Patient on FS w/ vent to rest and will attempt to put back on ATC as tolerated by patient. Patient nodded head in acknowledgement that she was feeling better. Tachypnea and WOB improved. RT will continue to monitor patient.

## 2020-03-29 NOTE — Hospital Course (Signed)
Pt admitted 3/21 with perforated sigmoid colon secondary to proximal rectal cancer. PMHx of COPD GOLD stage III.  S/p LAR with end colostomy 3/21 AT. Had some AKI post-op, nephrology was consulted, AKI improved with IV hydration and lasix. Stoma was ischemic first week post-op but functional. Pt was transferred out of ICU 3/26 but had acute respiratory decompensation 3/26 PM and had to be transferred back to ICU. Intubated 3/27. Patient started on heparin gtt in case of PE - unable to get CTA due to recurrent AKI.  3/29 Pt assessed and stoma found to be completely prolapsed and necrotic. CT A/P done 3/30 and patient taken back to the OR for suspected dead colon - exploratory laparotomy, takedown of colostomy, left colectomy and transverse colostomy. Heparin gtt stopped post-op  VQ scan with low probability for VTE 3/31. Patient remained intubated post-op, required pressors for septic shock.   TPN started 4/1.   CT 4/6 showed developing abscesses - IR consulted for drain placement, drain x2 placed 4/7. Culture grew pseudomonas aeruginosa. Patient was treated with abx Zosyn 3/21>3/29; Eraxis 3/31>>4/5, Merrem 3/29>>4/13.   Patient underwent tracheostomy by CCM 4/7 for VDRF.  Started on TF 4/8. TF were increased to goal and TPN d/c'd 4/13.  4/16: Planning repeat CT next week and possible LTACH placement

## 2020-03-29 NOTE — Progress Notes (Signed)
    Referring Physician(s): Byerly,F  Supervising Physician: Sandi Mariscal  Patient Status:  [facility] - In-pt  Chief Complaint: Abdominal fluid collections   Subjective: Pt remains on vent/trach; more alert; husband in room; mucus plug suctioned out today   Allergies: Codeine  Medications: Prior to Admission medications   Medication Sig Start Date End Date Taking? Authorizing Provider  acetaminophen (TYLENOL) 325 MG tablet Take 650 mg by mouth every 6 (six) hours as needed for moderate pain.   Yes [provider]  albuterol (PROVENTIL) (2.5 MG/3ML) 0.083% nebulizer solution INHALE 3ML VIA NEBULIZER EVERY 4 HOURS AS NEEDED FOR SHORTNESS OF BREATH 11/23/19  Yes Icard, Bradley L, DO  b complex vitamins tablet Take 1 tablet by mouth daily.   Yes [provider]  Fluticasone-Umeclidin-Vilant (TRELEGY ELLIPTA) 100-62.5-25 MCG/INH AEPB Inhale 1 puff into the lungs daily. 02/15/20  Yes Icard, Bradley L, DO  VITAMIN D, CHOLECALCIFEROL, PO Take 1 tablet by mouth daily.   Yes [provider]     Vital Signs: BP 115/63   Pulse 80   Temp 98 F (36.7 C) (Axillary)   Resp (!) 29 Comment: unlabored  Ht 5\' 2"  (1.575 m)   Wt 150 lb 12.7 oz (68.4 kg)   LMP 12/14/2000   SpO2 99%   BMI 27.58 kg/m   Physical Exam alert; left abd drains intact, OP diminishing in drain 2, 150 cc OP in drain 1 amber colored fluid with some tissue fragments  Imaging: No results found.  Labs:  CBC: Recent Labs    03/21/20 0327 03/23/20 0635 03/25/20 0520 03/28/20 1030  WBC 17.7* 14.9* 11.9* 7.1  HGB 9.2* 8.3* 8.5* 7.5*  HCT 30.0* 27.4* 27.6* 25.0*  PLT 390 334 341 351    COAGS: Recent Labs    03/09/20 1752  INR 1.3*    BMP: Recent Labs    03/24/20 0430 03/25/20 0520 03/26/20 0343 03/28/20 0551  NA 140 138 139 141  K 3.8 3.9 3.8 3.7  CL 106 104 109 110  CO2 28 24 24 24   GLUCOSE 135* 123* 118* 127*  BUN 54* 49* 45* 37*  CALCIUM 8.2* 8.2* 8.0* 8.0*  CREATININE  0.49 0.42* 0.43* 0.43*  GFRNONAA >60 >60 >60 >60  GFRAA >60 >60 >60 >60    LIVER FUNCTION TESTS: Recent Labs    03/23/20 0635 03/24/20 0430 03/25/20 0520 03/28/20 0551  BILITOT 0.6 0.6 0.3 0.7  AST 44* 55* 60* 38  ALT 36 45* 62* 67*  ALKPHOS 167* 212* 182* 274*  PROT 5.0* 5.2* 5.2* 5.1*  ALBUMIN 2.3* 2.2* 2.2* 2.0*    Assessment and Plan: Patient with history of perforated sigmoid colon, proximal rectal cancer with prior LAR/colon resection/colostomy; post opleft abdominal fluid collections, status post drain placement x2 on 4/7;afebrile; drain fl cx- pseudomonas/clostridium; no new labs; plan f/u CT next week   Electronically Signed: D. Rowe Robert, PA-C 03/29/2020, 4:17 PM   I spent a total of 15 minutes at the the patient's bedside AND on the patient's hospital floor or unit, greater than 50% of which was counseling/coordinating care for left abdominal fluid collection/abscess drains    Patient ID: Tracy [name], female   DOB: [birth year], 69 y.o.   MRN: [id number]

## 2020-03-29 NOTE — Progress Notes (Signed)
NAME:  Tracy [name], MRN:  [id number], DOB:  1951/02/28, LOS: 53 ADMISSION DATE:  03/03/2020, CONSULTATION DATE:  3/27  REFERRING MD:  Ninfa Linden, CHIEF COMPLAINT:  Abdominal pain   Brief History   69 y/o female admitted 3/21 with bowel perforation requiring colostomy and lower anterior resection in setting of newly diagnosed proximal rectal cancer with sigmoid perforation.  Post op developed septic shock, HCAP AKI ileus and acute hypoxemic respiratory failure requiring intubation.   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 Valeta Harms. Not on O2.  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, metastatic carcinoma in 3/15 lymph nodes  LE [name] 3/27 >> negative for DVT  ECHO 3/27 >> poor windows, normal LV function, severely dilated RV with moderately reduced function, RV volume and pressure overload, mildly dilated RA  V/Q Scan 3/30 >> low probability for PE  4/6 CT chest abdomen pelvis> 5.5x3.2 fluid collection along stomach, surgical drain again noted in pelvis, interval development of crescent shaped fluid collection measuring 16.4 x 3.3 in epigastric region and left  upper quadrant of abdomen (could be abscess) multiple other smaller fluid collections, colostomy noted, mild free fluid in posterior pelvis  Micro Data:  COVID 3/21 >> Negative MRSA PCR 3/21 >> Negative Blood cultures 3/27 >> negative  Tracheal Aspirate 3/29 >> Saccharomyces cerevisiae Trach aspirate 4/2 >> saccharomyces cerevisiae abscess abdomen 4/7 >> pseudomonas  Antimicrobials:  Zosyn 3/21 >> 3/29 Anidulafungin 3/31 >> 4/5 Meropenem 3/29 >> 4/13  Interim history/subjective:   No acute events. Tolerated ATM yesterday  Objective   Blood pressure 115/63, pulse 93, temperature (!) 100.4 F (38 C), temperature source Axillary, resp. rate (!) 21, height 5' 2"  (1.575 m), weight 68.4 kg, last menstrual period 12/14/2000, SpO2 99 %.    Vent Mode: PRVC FiO2 (%):  [28 %-30 %] 30 % Set Rate:  [10 bmp] 10 bmp Vt Set:  [400 mL] 400 mL PEEP:  [5 cmH20] 5 cmH20 Plateau Pressure:  [14 cmH20-15 cmH20] 14 cmH20   Intake/Output Summary (Last 24 hours) at 03/29/2020 0825 Last data filed at 03/29/2020 0600 Gross per 24 hour  Intake 1446.23 ml  Output 1805 ml  Net -358.77 ml   Filed Weights   03/27/20 0500 03/28/20 0500 03/29/20 0500  Weight: 68.8 kg 68.6 kg 68.4 kg    Examination: Gen:      No acute distress HEENT: Moist oral mucosa, anicteric Neck:     Soft, nontender, trach in place Lungs:    Clear bilaterally anteriorly CV:         Regular rate and rhythm; no murmurs Abd:      Bowel sounds appreciated, soft, nontender  Ext:    No edema; adequate peripheral perfusion Skin:       Warm and dry; no rash Neuro:    Awake, nods to questions  Resolved Hospital Problem list   AKI from ATN in setting of sepsis, Hypernatremia  Assessment & Plan:   Acute hypoxic respiratory failure. Failure to wean s/p tracheostomy. Severe COPD with emphysema. ATM as tolerated Will need LTACH placement Continue Pulmicort, Brovana Intermittent chest x-ray Xanax PRN for anxiety  Bowel  perforation/partial colectomy. Colon cancer. Moderate malnutrition. Post op care, nutrition per CCS Tube feeds-being tolerated  Abdominal abscess with Pseudomonas. Sepsis, present on admission. - drain placed 4/07 Finished course of meropenem  Hypertension. - goal SBP < 140, DBP < 105  Anemia of critical and chronic illness. - f/u CBC - transfuse for Hb < 7 or significant bleeding  Hyperglycemia. - SSI  CBC noted for anemia BMP within normal limits Last chest x-ray 4/12-evidence of emphysema  Spouse at bedside, ongoing care discussed with spouse  Best practice:  Diet: tube feeds DVT prophylaxis: sub q heparin  GI prophylaxis: protonix Mobility: bed rest Code Status: full Family Communication: Per primary team. Disposition: ICU  The patient is critically ill with multiple organ systems failure and requires high complexity decision making for assessment and support, frequent evaluation and titration of therapies, application of advanced monitoring technologies and extensive interpretation of multiple databases. Critical Care Time devoted to patient care services described in this note independent of APP/resident time (if applicable)  is 30 minutes.   Sherrilyn Rist MD [facility] Personal pager: [phone] If unanswered, please page CCM On-call: [phone]

## 2020-03-29 NOTE — Progress Notes (Signed)
[facility] Progress Note  17 Days Post-Op  Subjective: CC-  Having some abdominal pain this morning. No n/v. Ostomy functioning. Drains with 150, 0, and 5cc out last 24 hours. Tolerated PSV all day yesterday.  Objective: Vital signs in last 24 hours: Temp:  [98.1 F (36.7 C)-100.4 F (38 C)] 98.1 F (36.7 C) (04/16 0800) Pulse Rate:  [81-101] 93 (04/16 0600) Resp:  [15-30] 21 (04/16 0600) BP: (95-152)/(45-77) 115/63 (04/16 0600) SpO2:  [93 %-100 %] 99 % (04/16 0334) FiO2 (%):  [28 %-30 %] 30 % (04/16 0334) Weight:  [68.4 kg] 68.4 kg (04/16 0500) Last BM Date: (4/15 per colostomy)  Intake/Output from previous day: 04/15 0701 - 04/16 0700 In: 1446.2 [I.V.:24.2; NG/GT:1065; IV Piggyback:357.1] Out: [id number] [Urine:1100; Drains:155; Stool:550] Intake/Output this shift: No intake/output data recorded.  PE: [id number] HEENT: trach in place Pulm:Mechanically ventilated [id number], open midline incision light pink with sloughat entirety of base/ no significant drainage, ostomy viable withliquid stool in pouch. Surgical drain serous. IR drain x2 -only trace fluid in bulbs but both appear more serous Skin: warm and dry  Lab Results:  Recent Labs    03/28/20 1030  WBC 7.1  HGB 7.5*  HCT 25.0*  PLT 351   BMET Recent Labs    03/28/20 0551  NA 141  K 3.7  CL 110  CO2 24  GLUCOSE 127*  BUN 37*  CREATININE 0.43*  CALCIUM 8.0*   PT/INR No results for input(s): LABPROT, INR in the last 72 hours. CMP     Component Value Date/Time   NA 141 03/28/2020 0551   K 3.7 03/28/2020 0551   CL 110 03/28/2020 0551   CO2 24 03/28/2020 0551   GLUCOSE 127 (H) 03/28/2020 0551   BUN 37 (H) 03/28/2020 0551   CREATININE 0.43 (L) 03/28/2020 0551   CALCIUM 8.0 (L) 03/28/2020 0551   PROT 5.1 (L) 03/28/2020 0551   ALBUMIN 2.0 (L) 03/28/2020 0551   AST 38 03/28/2020 0551   ALT 67 (H) 03/28/2020 0551   ALKPHOS 274 (H) 03/28/2020 0551   BILITOT 0.7  03/28/2020 0551   GFRNONAA >60 03/28/2020 0551   GFRAA >60 03/28/2020 0551   Lipase     Component Value Date/Time   LIPASE 18 03/03/2020 1214       Studies/Results: No results found.  Anti-infectives: Anti-infectives (From admission, onward)   Start     Dose/Rate Route Frequency Ordered Stop   03/18/20 0900  meropenem (MERREM) 1 g in sodium chloride 0.9 % 100 mL IVPB  Status:  Discontinued     1 g 200 mL/hr over 30 Minutes Intravenous Every 8 hours 03/18/20 0838 03/26/20 1106   03/15/20 2200  meropenem (MERREM) 1 g in sodium chloride 0.9 % 100 mL IVPB  Status:  Discontinued     1 g 200 mL/hr over 30 Minutes Intravenous Every 12 hours 03/15/20 1021 03/18/20 0838   03/14/20 1000  anidulafungin (ERAXIS) 100 mg in sodium chloride 0.9 % 100 mL IVPB  Status:  Discontinued     100 mg 78 mL/hr over 100 Minutes Intravenous Every 24 hours 03/13/20 0752 03/18/20 0849   03/13/20 0900  anidulafungin (ERAXIS) 200 mg in sodium chloride 0.9 % 200 mL IVPB     200 mg 78 mL/hr over 200 Minutes Intravenous  Once 03/13/20 0752 03/13/20 1241   03/11/20 2000  meropenem (MERREM) 500 mg in sodium chloride 0.9 % 100 mL IVPB  Status:  Discontinued     500 mg 200  mL/hr over 30 Minutes Intravenous Every 12 hours 03/11/20 1658 03/15/20 1021   03/11/20 1200  piperacillin-tazobactam (ZOSYN) IVPB 2.25 g  Status:  Discontinued     2.25 g 100 mL/hr over 30 Minutes Intravenous Every 6 hours 03/11/20 1058 03/11/20 1641   03/06/20 1200  piperacillin-tazobactam (ZOSYN) IVPB 3.375 g  Status:  Discontinued     3.375 g 12.5 mL/hr over 240 Minutes Intravenous Every 8 hours 03/06/20 0739 03/11/20 1058   03/05/20 1400  piperacillin-tazobactam (ZOSYN) IVPB 2.25 g  Status:  Discontinued     2.25 g 100 mL/hr over 30 Minutes Intravenous Every 8 hours 03/05/20 0752 03/06/20 0739   03/04/20 2200  piperacillin-tazobactam (ZOSYN) IVPB 3.375 g  Status:  Discontinued     3.375 g 12.5 mL/hr over 240 Minutes Intravenous Every 8  hours 03/03/20 2032 03/05/20 0752   03/03/20 1545  cefoTEtan (CEFOTAN) 2 g in sodium chloride 0.9 % 100 mL IVPB     2 g 200 mL/hr over 30 Minutes Intravenous On call to O.R. 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 Marcello Moores 03/03/20 POD#17 S/p exploratory laparotomy and colon resection with transverse colostomy 3/31 Dr. Hassell Done -POD#71from second surgery. -increase to TID wet to drydressing changesto midline abdominal wound - WOC teamfollowing for ostomy teaching - s/p IR drain x2 4/7,culturePSEUDOMONAS AERUGINOSA, completed course of meropenem - ostomy viable and having bowel function  FEN -NPO, TF at55cc/hr VTE - SCDs,SQ heparin ID - Zosyn3/21>3/29; Eraxis 3/31>>4/5,Merrem 3/29>>4/13 Foley -wick  Plan:Tolerating tube feedings and having bowel function. CCM working on weaning vent. Monitor abdominal drain output, plan repeat scan once output decreases; she may also have some ascites driving output up, so even if output is still high will plan for repeat scan early next week. TOC team following for LTACH placement. Would like for abdominal drains to be out prior to discharge.   LOS: 26 days    [facility] 03/29/2020, 8:42 AM Please see Amion for pager number during day hours 7:00am-4:30pm

## 2020-03-30 DIAGNOSIS — K631 Perforation of intestine (nontraumatic): Secondary | ICD-10-CM | POA: Diagnosis not present

## 2020-03-30 DIAGNOSIS — J9601 Acute respiratory failure with hypoxia: Secondary | ICD-10-CM | POA: Diagnosis not present

## 2020-03-30 DIAGNOSIS — R579 Shock, unspecified: Secondary | ICD-10-CM | POA: Diagnosis not present

## 2020-03-30 LAB — CBC
HCT: 24.9 % — ABNORMAL LOW (ref 36.0–46.0)
Hemoglobin: 7.6 g/dL — ABNORMAL LOW (ref 12.0–15.0)
MCH: 29.9 pg (ref 26.0–34.0)
MCHC: 30.5 g/dL (ref 30.0–36.0)
MCV: 98 fL (ref 80.0–100.0)
Platelets: 332 10*3/uL (ref 150–400)
RBC: 2.54 MIL/uL — ABNORMAL LOW (ref 3.87–5.11)
RDW: 16 % — ABNORMAL HIGH (ref 11.5–15.5)
WBC: 6.4 10*3/uL (ref 4.0–10.5)
nRBC: 0 % (ref 0.0–0.2)

## 2020-03-30 LAB — GLUCOSE, CAPILLARY
Glucose-Capillary: 109 mg/dL — ABNORMAL HIGH (ref 70–99)
Glucose-Capillary: 111 mg/dL — ABNORMAL HIGH (ref 70–99)
Glucose-Capillary: 115 mg/dL — ABNORMAL HIGH (ref 70–99)
Glucose-Capillary: 115 mg/dL — ABNORMAL HIGH (ref 70–99)
Glucose-Capillary: 121 mg/dL — ABNORMAL HIGH (ref 70–99)
Glucose-Capillary: 122 mg/dL — ABNORMAL HIGH (ref 70–99)

## 2020-03-30 LAB — BASIC METABOLIC PANEL
Anion gap: 5 (ref 5–15)
BUN: 35 mg/dL — ABNORMAL HIGH (ref 8–23)
CO2: 27 mmol/L (ref 22–32)
Calcium: 8 mg/dL — ABNORMAL LOW (ref 8.9–10.3)
Chloride: 107 mmol/L (ref 98–111)
Creatinine, Ser: 0.41 mg/dL — ABNORMAL LOW (ref 0.44–1.00)
GFR calc Af Amer: >60 mL/min (ref >60)
GFR calc non Af Amer: >60 mL/min (ref >60)
Glucose, Bld: 122 mg/dL — ABNORMAL HIGH (ref 70–99)
Potassium: 3.6 mmol/L (ref 3.5–5.1)
Sodium: 139 mmol/L (ref 135–145)

## 2020-03-30 MED ORDER — CLONAZEPAM 0.5 MG PO TBDP
0.5000 mg | ORAL_TABLET | Freq: Two times a day (BID) | ORAL | Status: DC
  Administered 2020-03-30 – 2020-04-05 (×12): 0.5 mg via ORAL
  Filled 2020-03-30 (×12): qty 1

## 2020-03-30 NOTE — Progress Notes (Signed)
[facility] Progress Note  18 Days Post-Op  Subjective: CC-  Pt alert and awake. Ostomy functioning. Drains with min outout.  Objective: Vital signs in last 24 hours: Temp:  [98 F (36.7 C)-98.4 F (36.9 C)] 98.4 F (36.9 C) (04/16 2322) Pulse Rate:  [80-98] 89 (04/17 0600) Resp:  [16-32] 24 (04/17 0600) BP: (83-142)/(47-103) 111/69 (04/17 0600) SpO2:  [94 %-100 %] 100 % (04/17 0600) FiO2 (%):  [28 %-30 %] 30 % (04/17 0419) Weight:  [68.7 kg] 68.7 kg (04/17 0500) Last BM Date: (4/15 per colostomy)  Intake/Output from previous day: 04/16 0701 - 04/17 0700 In: 1200 [NG/GT:1200] Out: 1595 [Urine:1200; Drains:145; Stool:250] Intake/Output this shift: No intake/output data recorded.  PE: [id number] HEENT: trach in place Pulm:Mechanically ventilated [id number], open midline incision, ostomy viable withliquid stool in pouch. Surgical drain serous. IR drain x2 -only trace fluid in bulbs but both appear more serous Skin: warm and dry  Lab Results:  Recent Labs    03/28/20 1030 03/30/20 0313  WBC 7.1 6.4  HGB 7.5* 7.6*  HCT 25.0* 24.9*  PLT 351 332   BMET Recent Labs    03/28/20 0551 03/30/20 0313  NA 141 139  K 3.7 3.6  CL 110 107  CO2 24 27  GLUCOSE 127* 122*  BUN 37* 35*  CREATININE 0.43* 0.41*  CALCIUM 8.0* 8.0*   PT/INR No results for input(s): LABPROT, INR in the last 72 hours. CMP     Component Value Date/Time   NA 139 03/30/2020 0313   K 3.6 03/30/2020 0313   CL 107 03/30/2020 0313   CO2 27 03/30/2020 0313   GLUCOSE 122 (H) 03/30/2020 0313   BUN 35 (H) 03/30/2020 0313   CREATININE 0.41 (L) 03/30/2020 0313   CALCIUM 8.0 (L) 03/30/2020 0313   PROT 5.1 (L) 03/28/2020 0551   ALBUMIN 2.0 (L) 03/28/2020 0551   AST 38 03/28/2020 0551   ALT 67 (H) 03/28/2020 0551   ALKPHOS 274 (H) 03/28/2020 0551   BILITOT 0.7 03/28/2020 0551   GFRNONAA >60 03/30/2020 0313   GFRAA >60 03/30/2020 0313   Lipase     Component  Value Date/Time   LIPASE 18 03/03/2020 1214       Studies/Results: No results found.  Anti-infectives: Anti-infectives (From admission, onward)   Start     Dose/Rate Route Frequency Ordered Stop   03/18/20 0900  meropenem (MERREM) 1 g in sodium chloride 0.9 % 100 mL IVPB  Status:  Discontinued     1 g 200 mL/hr over 30 Minutes Intravenous Every 8 hours 03/18/20 0838 03/26/20 1106   03/15/20 2200  meropenem (MERREM) 1 g in sodium chloride 0.9 % 100 mL IVPB  Status:  Discontinued     1 g 200 mL/hr over 30 Minutes Intravenous Every 12 hours 03/15/20 1021 03/18/20 0838   03/14/20 1000  anidulafungin (ERAXIS) 100 mg in sodium chloride 0.9 % 100 mL IVPB  Status:  Discontinued     100 mg 78 mL/hr over 100 Minutes Intravenous Every 24 hours 03/13/20 0752 03/18/20 0849   03/13/20 0900  anidulafungin (ERAXIS) 200 mg in sodium chloride 0.9 % 200 mL IVPB     200 mg 78 mL/hr over 200 Minutes Intravenous  Once 03/13/20 0752 03/13/20 1241   03/11/20 2000  meropenem (MERREM) 500 mg in sodium chloride 0.9 % 100 mL IVPB  Status:  Discontinued     500 mg 200 mL/hr over 30 Minutes Intravenous Every 12 hours 03/11/20 1658 03/15/20 1021  03/11/20 1200  piperacillin-tazobactam (ZOSYN) IVPB 2.25 g  Status:  Discontinued     2.25 g 100 mL/hr over 30 Minutes Intravenous Every 6 hours 03/11/20 1058 03/11/20 1641   03/06/20 1200  piperacillin-tazobactam (ZOSYN) IVPB 3.375 g  Status:  Discontinued     3.375 g 12.5 mL/hr over 240 Minutes Intravenous Every 8 hours 03/06/20 0739 03/11/20 1058   03/05/20 1400  piperacillin-tazobactam (ZOSYN) IVPB 2.25 g  Status:  Discontinued     2.25 g 100 mL/hr over 30 Minutes Intravenous Every 8 hours 03/05/20 0752 03/06/20 0739   03/04/20 2200  piperacillin-tazobactam (ZOSYN) IVPB 3.375 g  Status:  Discontinued     3.375 g 12.5 mL/hr over 240 Minutes Intravenous Every 8 hours 03/03/20 2032 03/05/20 0752   03/03/20 1545  cefoTEtan (CEFOTAN) 2 g in sodium chloride 0.9 %  100 mL IVPB     2 g 200 mL/hr over 30 Minutes Intravenous On call to O.R. 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 Marcello Moores 03/03/20  S/p exploratory laparotomy and colon resection with transverse colostomy 3/31 Dr. Hassell Done -increase to TID wet to drydressing changesto midline abdominal wound - WOC teamfollowing for ostomy teaching - s/p IR drain x2 4/7,culturePSEUDOMONAS AERUGINOSA, completed course of meropenem - ostomy viable and having bowel function  FEN -NPO, TF at55cc/hr VTE - SCDs,SQ heparin ID - Zosyn3/21>3/29; Eraxis 3/31>>4/5,Merrem 3/29>>4/13 Foley -wick  Plan:Tolerating tube feedings and having bowel function. CCM working on weaning vent. Monitor abdominal drain output, plan repeat scan next week; TOC team following for LTACH placement. Would like for abdominal drains to be out prior to discharge.   LOS: 58 days    Rosario Adie, MD  [facility]

## 2020-03-30 NOTE — Progress Notes (Signed)
NAME:  Tracy [name], MRN:  [id number], DOB:  28-Nov-1951, LOS: 12 ADMISSION DATE:  03/03/2020, CONSULTATION DATE:  3/27 REFERRING MD:  Ninfa Linden, CHIEF COMPLAINT:  Abdominal pain   Brief History   69 y/o female admitted 3/21 with bowel perforation requiring colostomy and lower anterior resection in setting of newly diagnosed proximal rectal cancer with sigmoid perforation.  Post op developed septic shock, HCAP AKI ileus and acute hypoxemic respiratory failure requiring intubation.   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 Valeta Harms. Not on O2.  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, metastatic carcinoma in 3/15 lymph nodes  LE [name] 3/27 >> negative for DVT  ECHO 3/27 >> poor windows, normal LV function, severely dilated RV with moderately reduced function, RV volume and pressure overload, mildly dilated RA  V/Q Scan 3/30 >> low probability for PE  4/6 CT chest abdomen pelvis> 5.5x3.2 fluid collection along stomach, surgical drain again noted in pelvis, interval development of crescent shaped fluid collection measuring 16.4 x 3.3  in epigastric region and left upper quadrant of abdomen (could be abscess) multiple other smaller fluid collections, colostomy noted, mild free fluid in posterior pelvis  Micro Data:  COVID 3/21 >> Negative MRSA PCR 3/21 >> Negative Blood cultures 3/27 >>negative  Tracheal Aspirate 3/29 >> Saccharomyces cerevisiae Trach aspirate 4/2 >> saccharomyces cerevisiae abscess abdomen 4/7 >> pseudomonas  Antimicrobials:  Zosyn 3/21 >> 3/29 Anidulafungin 3/31 >> 4/5 Meropenem 3/29 >> 4/13  Interim history/subjective:   Trach collar yesterday Plugged last night Anxiety overnight> dilaudid, xanax Tachypnea this morning, anxiety?   Objective   Blood pressure 111/69, pulse 89, temperature 98.8 F (37.1 C), temperature source Oral, resp. rate (!) 24, height 5' 2"  (1.575 m), weight 68.7 kg, last menstrual period 12/14/2000, SpO2 100 %.    Vent Mode: PRVC FiO2 (%):  [28 %-30 %] 30 % Set Rate:  [10 bmp] 10 bmp Vt Set:  [400 mL] 400 mL PEEP:  [5 cmH20] 5 cmH20 Pressure Support:  [10 cmH20] 10 cmH20 Plateau Pressure:  [15 cmH20-24 cmH20] 24 cmH20   Intake/Output Summary (Last 24 hours) at 03/30/2020 0820 Last data filed at 03/30/2020 0540 Gross per 24 hour  Intake 1200 ml  Output 1595 ml  Net -395 ml   Filed Weights   03/28/20 0500 03/29/20 0500 03/30/20 0500  Weight: 68.6 kg 68.4 kg 68.7 kg    Examination:  General:  In bed on vent HENT: NCAT trach in place PULM: Poor air movement B, vent supported breathing CV: RRR, no mgr GI: BS+, soft, ostomy MSK: normal bulk and tone Neuro: awake on vent    Resolved  Hospital Problem list     Assessment & Plan:  Acute hypoxemic respiratory failure  Failure to wean, s/p tracheostomy Severe COPD/Emphysema Continue bronchodilator and inhaled corticosteroids as ordered Trach collar trial again during daytime Trach care per routine Resume ventilator at night  Bowel perforation/partial colectomy Colon cancer Moderate  malnutrition Continue enteric feeding per general surgery recommendations  Anxiety Add clonazepam low dose BID  Abdominal abscess with pseudomonas Monitor off antibiotics  Hypertension : resolved Monitor   Anemia of chronic illness Monitor for bleeding Transfuse PRBC for Hgb < 7 gm/dL  Hyperglycemia SSI Accuchecks  Best practice:  Diet: tube feeding Pain/Anxiety/Delirium protocol (if indicated): yes, RASS target 0 VAP protocol (if indicated): yes DVT prophylaxis: sub q heparin GI prophylaxis: Pantoprazole for stress ulcer prophylaxis Glucose control: SSI Mobility: PT consult Code Status: DNR if arrests Family Communication: Danny updated bedside Disposition: remain in ICU   Labs   CBC: Recent Labs  Lab 03/25/20 0520 03/28/20 1030 03/30/20 0313  WBC 11.9* 7.1 6.4  NEUTROABS 10.4*  --   --   HGB 8.5* 7.5* 7.6*  HCT 27.6* 25.0* 24.9*  MCV 100.0 100.8* 98.0  PLT 341 351 701    Basic Metabolic Panel: Recent Labs  Lab 03/24/20 0430 03/25/20 0520 03/26/20 0343 03/28/20 0551 03/30/20 0313  NA 140 138 139 141 139  K 3.8 3.9 3.8 3.7 3.6  CL 106 104 109 110 107  CO2 28 24 24 24 27   GLUCOSE 135* 123* 118* 127* 122*  BUN 54* 49* 45* 37* 35*  CREATININE 0.49 0.42* 0.43* 0.43* 0.41*  CALCIUM 8.2* 8.2* 8.0* 8.0* 8.0*  MG 2.0 2.0 1.9 1.8  --   PHOS 2.4* 2.6 2.9 2.3*  --    GFR: Estimated Creatinine Clearance: 61.9 mL/min (A) (by C-G formula based on SCr of 0.41 mg/dL (L)). Recent Labs  Lab 03/25/20 0520 03/28/20 1030 03/30/20 0313  WBC 11.9* 7.1 6.4    Liver Function Tests: Recent Labs  Lab 03/24/20 0430 03/25/20 0520 03/28/20 0551  AST 55* 60* 38  ALT 45* 62* 67*  ALKPHOS 212* 182* 274*  BILITOT 0.6 0.3 0.7  PROT 5.2* 5.2* 5.1*  ALBUMIN 2.2* 2.2* 2.0*   No results for input(s): LIPASE, AMYLASE in the last 168 hours. No results for input(s): AMMONIA in the last 168 hours.  ABG    Component Value Date/Time   PHART 7.398 03/16/2020 2245    PCO2ART 45.6 03/16/2020 2245   PO2ART 70.3 (L) 03/16/2020 2245   HCO3 27.7 03/16/2020 2245   TCO2 24 03/12/2020 1513   ACIDBASEDEF 4.8 (H) 03/12/2020 1738   O2SAT 93.5 03/16/2020 2245     Coagulation Profile: No results for input(s): INR, PROTIME in the last 168 hours.  Cardiac Enzymes: No results for input(s): CKTOTAL, CKMB, CKMBINDEX, TROPONINI in the last 168 hours.  HbA1C: Hgb A1c MFr Bld  Date/Time Value Ref Range Status  03/11/2020 05:00 AM 5.9 (H) 4.8 - 5.6 % Final    Comment:    (NOTE) Pre diabetes:          5.7%-6.4% Diabetes:              >6.4% Glycemic control for   <7.0% adults with diabetes     CBG: Recent Labs  Lab 03/29/20 1522 03/29/20 1949 03/29/20 2318 03/30/20 0319 03/30/20 0756  GLUCAP 108* 119* 127* 111* 122*       Critical care time: 35 minutes     Roselie Awkward, MD [facility] Pager: [phone]  Cell: [phone] If no response, call [phone]

## 2020-03-30 NOTE — Progress Notes (Signed)
Attempted PT on ATC and PS/ CPAP- both resulted in PT having high RR (40's) and PT conveys she is not breathing well. RN aware.

## 2020-03-31 DIAGNOSIS — R579 Shock, unspecified: Secondary | ICD-10-CM | POA: Diagnosis not present

## 2020-03-31 DIAGNOSIS — J9601 Acute respiratory failure with hypoxia: Secondary | ICD-10-CM | POA: Diagnosis not present

## 2020-03-31 DIAGNOSIS — K631 Perforation of intestine (nontraumatic): Secondary | ICD-10-CM | POA: Diagnosis not present

## 2020-03-31 LAB — BASIC METABOLIC PANEL
Anion gap: 6 (ref 5–15)
BUN: 36 mg/dL — ABNORMAL HIGH (ref 8–23)
CO2: 29 mmol/L (ref 22–32)
Calcium: 8.3 mg/dL — ABNORMAL LOW (ref 8.9–10.3)
Chloride: 104 mmol/L (ref 98–111)
Creatinine, Ser: 0.38 mg/dL — ABNORMAL LOW (ref 0.44–1.00)
GFR calc Af Amer: >60 mL/min (ref >60)
GFR calc non Af Amer: >60 mL/min (ref >60)
Glucose, Bld: 128 mg/dL — ABNORMAL HIGH (ref 70–99)
Potassium: 3.8 mmol/L (ref 3.5–5.1)
Sodium: 139 mmol/L (ref 135–145)

## 2020-03-31 LAB — GLUCOSE, CAPILLARY
Glucose-Capillary: 100 mg/dL — ABNORMAL HIGH (ref 70–99)
Glucose-Capillary: 102 mg/dL — ABNORMAL HIGH (ref 70–99)
Glucose-Capillary: 112 mg/dL — ABNORMAL HIGH (ref 70–99)
Glucose-Capillary: 118 mg/dL — ABNORMAL HIGH (ref 70–99)
Glucose-Capillary: 120 mg/dL — ABNORMAL HIGH (ref 70–99)
Glucose-Capillary: 142 mg/dL — ABNORMAL HIGH (ref 70–99)

## 2020-03-31 MED ORDER — FUROSEMIDE 10 MG/ML IJ SOLN
40.0000 mg | Freq: Four times a day (QID) | INTRAMUSCULAR | Status: AC
  Administered 2020-03-31 (×2): 40 mg via INTRAVENOUS
  Filled 2020-03-31 (×2): qty 4

## 2020-03-31 NOTE — Evaluation (Signed)
Physical Therapy Evaluation Patient Details Name: Tracy [name] MRN: [id number] DOB: 1951/06/10 Today's Date: 03/31/2020   History of Present Illness  Tracy [name] is an 69 y.o. female who is here for abd pain,  diarrhea for several weeks. Found to have bowel perforation, perforated viscus.   S/Plow anterior resection end colostomy on 03/03/20.  Post-op pt developed septic shock, HCAP, AKI ileus and acute hypoxemic respiratory failure requiring intubation.  Pt continues on vent but with weaning initiated and pt alert to following cues and nodding/shaking head to respond to questions.  Clinical Impression  Pt currently severely deconditioned after several weeks on vent.  Pt continues on vent but is able to nod/shake head appropriately in response to questions and intermittently assisting with ROM.  Pt was positioned with bed in chair  position earlier this date but is not sufficiently strong enough to attempt more aggressive mobility at this time.  PT will follow pt while in acute care to progress strengthening as tolerated and advance to basic mobility tasks based on pt capability/condition.    Follow Up Recommendations LTACH    Equipment Recommendations  None recommended by PT    Recommendations for Other Services OT consult     Precautions / Restrictions Precautions Precautions: Fall Precaution Comments: colostomt, right JP, emesis possible Restrictions Weight Bearing Restrictions: No      Mobility  Bed Mobility               General bed mobility comments: NT - RN reports pt with bed in chair position for extended period earlier  Transfers                    Ambulation/Gait                Stairs            Wheelchair Mobility    Modified Rankin (Stroke Patients Only)       Balance                                             Pertinent Vitals/Pain Pain Assessment: Faces Faces Pain Scale: Hurts little more Pain  Location: trach site with change in bed position Pain Descriptors / Indicators: Grimacing Pain Intervention(s): Limited activity within patient's tolerance;Monitored during session;Repositioned    Home Living Family/patient expects to be discharged to:: Unsure                      Prior Function Level of Independence: Independent               Hand Dominance   Dominant Hand: Right    Extremity/Trunk Assessment   Upper Extremity Assessment Upper Extremity Assessment: Generalized weakness;RUE deficits/detail;LUE deficits/detail RUE Deficits / Details: PROM WFL but strength grossly 1 - 1+/5 LUE Deficits / Details: AAROM grossly WFL with grossly 1+ - 2- strength    Lower Extremity Assessment Lower Extremity Assessment: Generalized weakness;RLE deficits/detail;LLE deficits/detail RLE Deficits / Details: Pt participating minimally with movement; PROM ltd to 95 flex at hip, 115 at knee and neutral DF LLE Deficits / Details: Pt participating minimally with movement but PROM ltd to ~90 flex at hip, 110 at knee and to neutral DF       Communication   Communication: Other (comment)(Pt on vent but nodding and shaking head appropriately)  Cognition Arousal/Alertness: Lethargic(but  rousable) Behavior During Therapy: Flat affect Overall Cognitive Status: Difficult to assess                                 General Comments: Pt on vent but nodding and shaking head appropriately      General Comments      Exercises General Exercises - Upper Extremity Shoulder Flexion: AAROM;PROM;Both;5 reps;Supine Elbow Flexion: AAROM;PROM;Both;5 reps;Supine Elbow Extension: AAROM;PROM;Both;5 reps;Supine Wrist Flexion: AAROM;PROM;Both;5 reps;Supine Wrist Extension: AAROM;PROM;Both;5 reps;Supine General Exercises - Lower Extremity Ankle Circles/Pumps: AAROM;PROM;Both;5 reps;Supine Heel Slides: PROM;Both;5 reps;Supine Hip ABduction/ADduction: AAROM;PROM;Both;5 reps;Supine    Assessment/Plan    PT Assessment Patient needs continued PT services  PT Problem List Decreased strength;Decreased range of motion;Decreased activity tolerance;Decreased balance;Decreased mobility;Decreased coordination;Decreased knowledge of use of DME       PT Treatment Interventions Therapeutic activities;Therapeutic exercise;Functional mobility training    PT Goals (Current goals can be found in the Care Plan section)  Acute Rehab PT Goals Patient Stated Goal: No specific goals expressed PT Goal Formulation: With patient/family Time For Goal Achievement: 04/14/20 Potential to Achieve Goals: Fair    Frequency Min 2X/week   Barriers to discharge        Co-evaluation               AM-PAC PT "6 Clicks" Mobility  Outcome Measure Help needed turning from your back to your side while in a flat bed without using bedrails?: Total Help needed moving from lying on your back to sitting on the side of a flat bed without using bedrails?: Total Help needed moving to and from a bed to a chair (including a wheelchair)?: Total Help needed standing up from a chair using your arms (e.g., wheelchair or bedside chair)?: Total Help needed to walk in hospital room?: Total Help needed climbing 3-5 steps with a railing? : Total 6 Click Score: 6    End of Session Equipment Utilized During Treatment: Oxygen Activity Tolerance: Patient limited by fatigue;Patient limited by lethargy Patient left: in bed;with call bell/phone within reach;with family/visitor present   PT Visit Diagnosis: Muscle weakness (generalized) (M62.81)    Time: [id number] PT Time Calculation (min) (ACUTE ONLY): 21 min   Charges:   PT Evaluation $PT Re-evaluation: 1 Re-eval          [place] Pager [phone] Office [phone]   Sydnee Lamour 03/31/2020, 4:40 PM

## 2020-03-31 NOTE — Progress Notes (Signed)
Placed PT on 28% ATC- tolerating very well at this time. RN aware.

## 2020-03-31 NOTE — Progress Notes (Signed)
NAME:  Tracy [name], MRN:  [id number], DOB:  04-02-1951, LOS: 2 ADMISSION DATE:  03/03/2020, CONSULTATION DATE:  3/27 REFERRING MD:  Ninfa Linden, CHIEF COMPLAINT:  Abdominal pain   Brief History   69 y/o female admitted 3/21 with bowel perforation requiring colostomy and lower anterior resection in setting of newly diagnosed proximal rectal cancer with sigmoid perforation.  Post op developed septic shock, HCAP AKI ileus and acute hypoxemic respiratory failure requiring intubation.   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 Valeta Harms. Not on O2.  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, metastatic carcinoma in 3/15 lymph nodes  LE [name] 3/27 >> negative for DVT  ECHO 3/27 >> poor windows, normal LV function, severely dilated RV with moderately reduced function, RV volume and pressure overload, mildly dilated RA  V/Q Scan 3/30 >> low probability for PE  4/6 CT chest abdomen pelvis> 5.5x3.2 fluid collection along stomach, surgical drain again noted in pelvis, interval development of crescent shaped fluid collection measuring 16.4 x 3.3  in epigastric region and left upper quadrant of abdomen (could be abscess) multiple other smaller fluid collections, colostomy noted, mild free fluid in posterior pelvis  Micro Data:  COVID 3/21 >> Negative MRSA PCR 3/21 >> Negative Blood cultures 3/27 >>negative  Tracheal Aspirate 3/29 >> Saccharomyces cerevisiae Trach aspirate 4/2 >> saccharomyces cerevisiae abscess abdomen 4/7 >> pseudomonas  Antimicrobials:  Zosyn 3/21 >> 3/29 Anidulafungin 3/31 >> 4/5 Meropenem 3/29 >> 4/13  Interim history/subjective:   Weaning on pressure support but not on trach collar  Objective   Blood pressure 126/61, pulse 94, temperature 99.2 F (37.3 C), temperature source Axillary, resp. rate (!) 22, height '5\' 2"'$  (1.575 m), weight 69.5 kg, last menstrual period 12/14/2000, SpO2 98 %.    Vent Mode: PRVC FiO2 (%):  [30 %] 30 % Set Rate:  [10 bmp] 10 bmp Vt Set:  [400 mL] 400 mL PEEP:  [5 cmH20] 5 cmH20 Pressure Support:  [10 cmH20] 10 cmH20 Plateau Pressure:  [12 cmH20-15 cmH20] 15 cmH20   Intake/Output Summary (Last 24 hours) at 03/31/2020 0755 Last data filed at 03/31/2020 0630 Gross per 24 hour  Intake 2015 ml  Output 1868 ml  Net 147 ml   Filed Weights   03/29/20 0500 03/30/20 0500 03/31/20 0500  Weight: 68.4 kg 68.7 kg 69.5 kg    Examination:  General:  In bed on vent HENT: NCAT tracheostomy in place PULM: CTA B, vent supported breathing CV: RRR, no mgr GI: BS+, soft, nontender MSK: normal bulk and tone Neuro: awake on vent   Resolved Hospital Problem list     Assessment & Plan:  Acute hypoxemic respiratory failure  Failure to wean, s/p tracheostomy Severe COPD/Emphysema Continue bronchodilator and inhaled corticosteroids as ordered Trach collar> attempt again this afternoon Trach care per routine Resume ventilator at night Pursue LTAC options when OK by surgery  Bowel perforation/partial colectomy Colon cancer Moderate malnutrition Continue enteric feeding per  general surgery recommendations  Anxiety Continue low dose clonazepam bid Prn xanax   Abdominal abscess with pseudomonas Monitor off antibiotics  Hypertension : resolved Monitor  Anemia of chronic illness Monitor for bleeding Transfuse PRBC for Hgb < 7 gm/dL  Hyperglycemia SSI accuchecks  Best practice:  Diet: tube feeding Pain/Anxiety/Delirium protocol (if indicated): yes, RASS target 0 VAP protocol (if indicated): yes DVT prophylaxis: sub q heparin GI prophylaxis: Pantoprazole for stress ulcer prophylaxis Glucose control: SSI Mobility: PT consult Code Status: DNR if arrests Family Communication: Danny updated bedside Disposition: remain in ICU   Labs   CBC: Recent Labs  Lab 03/25/20 0520 03/28/20 1030 03/30/20 0313  WBC 11.9* 7.1 6.4  NEUTROABS 10.4*  --   --   HGB 8.5* 7.5* 7.6*  HCT 27.6* 25.0* 24.9*  MCV 100.0 100.8* 98.0  PLT 341 351 450    Basic Metabolic Panel: Recent Labs  Lab 03/25/20 0520 03/26/20 0343 03/28/20 0551 03/30/20 0313 03/31/20 0410  NA 138 139 141 139 139  K 3.9 3.8 3.7 3.6 3.8  CL 104 109 110 107 104  CO2 '24 24 24 27 29  '$ GLUCOSE 123* 118* 127* 122* 128*  BUN 49* 45* 37* 35* 36*  CREATININE 0.42* 0.43* 0.43* 0.41* 0.38*  CALCIUM 8.2* 8.0* 8.0* 8.0* 8.3*  MG 2.0 1.9 1.8  --   --   PHOS 2.6 2.9 2.3*  --   --    GFR: Estimated Creatinine Clearance: 62.4 mL/min (A) (by C-G formula based on SCr of 0.38 mg/dL (L)). Recent Labs  Lab 03/25/20 0520 03/28/20 1030 03/30/20 0313  WBC 11.9* 7.1 6.4    Liver Function Tests: Recent Labs  Lab 03/25/20 0520 03/28/20 0551  AST 60* 38  ALT 62* 67*  ALKPHOS 182* 274*  BILITOT 0.3 0.7  PROT 5.2* 5.1*  ALBUMIN 2.2* 2.0*   No results for input(s): LIPASE, AMYLASE in the last 168 hours. No results for input(s): AMMONIA in the last 168 hours.  ABG    Component Value Date/Time   PHART 7.398 03/16/2020 2245   PCO2ART 45.6 03/16/2020 2245   PO2ART 70.3 (L) 03/16/2020 2245    HCO3 27.7 03/16/2020 2245   TCO2 24 03/12/2020 1513   ACIDBASEDEF 4.8 (H) 03/12/2020 1738   O2SAT 93.5 03/16/2020 2245     Coagulation Profile: No results for input(s): INR, PROTIME in the last 168 hours.  Cardiac Enzymes: No results for input(s): CKTOTAL, CKMB, CKMBINDEX, TROPONINI in the last 168 hours.  HbA1C: Hgb A1c MFr Bld  Date/Time Value Ref Range Status  03/11/2020 05:00 AM 5.9 (H) 4.8 - 5.6 % Final    Comment:    (NOTE) Pre diabetes:          5.7%-6.4% Diabetes:              >6.4% Glycemic control for   <7.0% adults with diabetes     CBG: Recent Labs  Lab 03/30/20 1551 03/30/20 1930 03/30/20 2357 03/31/20 0333 03/31/20 0743  GLUCAP 121* 109* 115* 120* 102*       Critical care time: 30 minutes     Roselie Awkward, MD [facility] Pager: [phone] Cell: [phone] If no response, call  [phone]   

## 2020-03-31 NOTE — Progress Notes (Signed)
[facility] Progress Note  19 Days Post-Op  Subjective: CC-  Pt alert and awake. Ostomy functioning. Drains with min outout.  Trying trach collar trials during the day  Objective: Vital signs in last 24 hours: Temp:  [97.7 F (36.5 C)-99.2 F (37.3 C)] 99.2 F (37.3 C) (04/18 0400) Pulse Rate:  [38-100] 94 (04/18 0500) Resp:  [18-30] 22 (04/18 0500) BP: (88-128)/(33-66) 126/61 (04/18 0500) SpO2:  [96 %-100 %] 98 % (04/18 0500) FiO2 (%):  [30 %] 30 % (04/18 0355) Weight:  [69.5 kg] 69.5 kg (04/18 0500) Last BM Date: 03/30/20  Intake/Output from previous day: 04/17 0701 - 04/18 0700 In: 2015 [I.V.:110; NG/GT:1890] Out: 1868 [Urine:1750; Drains:118] Intake/Output this shift: No intake/output data recorded.  PE: [id number] HEENT: trach in place Pulm:Mechanically ventilated [id number], open midline incision, ostomy viable withliquid stool in pouch. Surgical drain serous. IR drain x2 -only trace fluid in bulbs but both appear more serous Skin: warm and dry  Lab Results:  Recent Labs    03/28/20 1030 03/30/20 0313  WBC 7.1 6.4  HGB 7.5* 7.6*  HCT 25.0* 24.9*  PLT 351 332   BMET Recent Labs    03/30/20 0313 03/31/20 0410  NA 139 139  K 3.6 3.8  CL 107 104  CO2 27 29  GLUCOSE 122* 128*  BUN 35* 36*  CREATININE 0.41* 0.38*  CALCIUM 8.0* 8.3*   PT/INR No results for input(s): LABPROT, INR in the last 72 hours. CMP     Component Value Date/Time   NA 139 03/31/2020 0410   K 3.8 03/31/2020 0410   CL 104 03/31/2020 0410   CO2 29 03/31/2020 0410   GLUCOSE 128 (H) 03/31/2020 0410   BUN 36 (H) 03/31/2020 0410   CREATININE 0.38 (L) 03/31/2020 0410   CALCIUM 8.3 (L) 03/31/2020 0410   PROT 5.1 (L) 03/28/2020 0551   ALBUMIN 2.0 (L) 03/28/2020 0551   AST 38 03/28/2020 0551   ALT 67 (H) 03/28/2020 0551   ALKPHOS 274 (H) 03/28/2020 0551   BILITOT 0.7 03/28/2020 0551   GFRNONAA >60 03/31/2020 0410   GFRAA >60 03/31/2020 0410    Lipase     Component Value Date/Time   LIPASE 18 03/03/2020 1214       Studies/Results: No results found.  Anti-infectives: Anti-infectives (From admission, onward)   Start     Dose/Rate Route Frequency Ordered Stop   03/18/20 0900  meropenem (MERREM) 1 g in sodium chloride 0.9 % 100 mL IVPB  Status:  Discontinued     1 g 200 mL/hr over 30 Minutes Intravenous Every 8 hours 03/18/20 0838 03/26/20 1106   03/15/20 2200  meropenem (MERREM) 1 g in sodium chloride 0.9 % 100 mL IVPB  Status:  Discontinued     1 g 200 mL/hr over 30 Minutes Intravenous Every 12 hours 03/15/20 1021 03/18/20 0838   03/14/20 1000  anidulafungin (ERAXIS) 100 mg in sodium chloride 0.9 % 100 mL IVPB  Status:  Discontinued     100 mg 78 mL/hr over 100 Minutes Intravenous Every 24 hours 03/13/20 0752 03/18/20 0849   03/13/20 0900  anidulafungin (ERAXIS) 200 mg in sodium chloride 0.9 % 200 mL IVPB     200 mg 78 mL/hr over 200 Minutes Intravenous  Once 03/13/20 0752 03/13/20 1241   03/11/20 2000  meropenem (MERREM) 500 mg in sodium chloride 0.9 % 100 mL IVPB  Status:  Discontinued     500 mg 200 mL/hr over 30 Minutes Intravenous Every 12  hours 03/11/20 1658 03/15/20 1021   03/11/20 1200  piperacillin-tazobactam (ZOSYN) IVPB 2.25 g  Status:  Discontinued     2.25 g 100 mL/hr over 30 Minutes Intravenous Every 6 hours 03/11/20 1058 03/11/20 1641   03/06/20 1200  piperacillin-tazobactam (ZOSYN) IVPB 3.375 g  Status:  Discontinued     3.375 g 12.5 mL/hr over 240 Minutes Intravenous Every 8 hours 03/06/20 0739 03/11/20 1058   03/05/20 1400  piperacillin-tazobactam (ZOSYN) IVPB 2.25 g  Status:  Discontinued     2.25 g 100 mL/hr over 30 Minutes Intravenous Every 8 hours 03/05/20 0752 03/06/20 0739   03/04/20 2200  piperacillin-tazobactam (ZOSYN) IVPB 3.375 g  Status:  Discontinued     3.375 g 12.5 mL/hr over 240 Minutes Intravenous Every 8 hours 03/03/20 2032 03/05/20 0752   03/03/20 1545  cefoTEtan (CEFOTAN) 2 g  in sodium chloride 0.9 % 100 mL IVPB     2 g 200 mL/hr over 30 Minutes Intravenous On call to O.R. 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 Marcello Moores 03/03/20  S/p exploratory laparotomy and colon resection with transverse colostomy 3/31 Dr. Hassell Done -wet to drydressing changesto midline abdominal wound - WOC teamfollowing for ostomy teaching - s/p IR drain x2 4/7,culturePSEUDOMONAS AERUGINOSA, completed course of meropenem - ostomy viable and having bowel function  FEN -NPO, TF at55cc/hr VTE - SCDs,SQ heparin ID - Zosyn3/21>3/29; Eraxis 3/31>>4/5,Merrem 3/29>>4/13 Foley -wick  Plan:Tolerating tube feedings and having bowel function. CCM working on weaning vent. Monitor abdominal drain output, plan repeat scan next week; TOC team following for LTACH placement. Would like for abdominal drains to be out prior to discharge.   LOS: 28 days    Rosario Adie, MD  [facility]

## 2020-03-31 NOTE — Progress Notes (Signed)
Midline incision wet to dry dsg. TID, wd with green drainage noted ? new

## 2020-03-31 NOTE — Consult Note (Addendum)
[place] Nurse ostomy follow up Stoma type/location: LLQ Colostomy Stomal assessment/size: Pale, dry, pink.  Lumen in center. Peristomal assessment:   New Finding: Mucocutaneous separation from 7-10 o'clock measuring 0.4cm and with 0.2cm depth. Irregular, pin-hole openings with scant tan exudate.  Treatment options for stomal/peristomal skin: skin barrier ring Output: brown/tan stool  Ostomy pouching: 2pc. 2 and 1/4 inch pouching system with skin barrier ring.  Pouches brought to room, 3 skin barriers, 3 pouches, 3 skin barrier rings Education provided: Illustrated to husband and bedside RN Baldo Daub) the new finding at the medial mucocutaneous junction of the stoma noted above. Discussed Ceramide additive in both ring and skin barrier and why the liquid barrier film wipe (Cavilon) was not being used. Explained that if depth of mucocutaneous separation extends, filling with stoma powder, at least initially, will be indicated. Enrolled patient in [facility] Discharge program: Yes, previously.   [place] nursing team will follow twice weekly at this time to monitor mucocutaneous separation, and will remain available to this patient, the nursing and medical teams.    Thanks, Maudie Flakes, MSN, RN, [place], Arther Abbott  Pager# [phone]

## 2020-04-01 ENCOUNTER — Encounter (HOSPITAL_COMMUNITY): Payer: Self-pay

## 2020-04-01 ENCOUNTER — Inpatient Hospital Stay (HOSPITAL_COMMUNITY): Payer: Medicare HMO

## 2020-04-01 DIAGNOSIS — J9601 Acute respiratory failure with hypoxia: Secondary | ICD-10-CM | POA: Diagnosis not present

## 2020-04-01 LAB — CBC
HCT: 27 % — ABNORMAL LOW (ref 36.0–46.0)
Hemoglobin: 8.3 g/dL — ABNORMAL LOW (ref 12.0–15.0)
MCH: 29.9 pg (ref 26.0–34.0)
MCHC: 30.7 g/dL (ref 30.0–36.0)
MCV: 97.1 fL (ref 80.0–100.0)
Platelets: 454 10*3/uL — ABNORMAL HIGH (ref 150–400)
RBC: 2.78 MIL/uL — ABNORMAL LOW (ref 3.87–5.11)
RDW: 16 % — ABNORMAL HIGH (ref 11.5–15.5)
WBC: 8.2 10*3/uL (ref 4.0–10.5)
nRBC: 0 % (ref 0.0–0.2)

## 2020-04-01 LAB — COMPREHENSIVE METABOLIC PANEL
ALT: 50 U/L — ABNORMAL HIGH (ref 0–44)
AST: 35 U/L (ref 15–41)
Albumin: 2 g/dL — ABNORMAL LOW (ref 3.5–5.0)
Alkaline Phosphatase: 251 U/L — ABNORMAL HIGH (ref 38–126)
Anion gap: 11 (ref 5–15)
BUN: 41 mg/dL — ABNORMAL HIGH (ref 8–23)
CO2: 28 mmol/L (ref 22–32)
Calcium: 8.2 mg/dL — ABNORMAL LOW (ref 8.9–10.3)
Chloride: 99 mmol/L (ref 98–111)
Creatinine, Ser: 0.39 mg/dL — ABNORMAL LOW (ref 0.44–1.00)
GFR calc Af Amer: >60 mL/min (ref >60)
GFR calc non Af Amer: >60 mL/min (ref >60)
Glucose, Bld: 145 mg/dL — ABNORMAL HIGH (ref 70–99)
Potassium: 3.1 mmol/L — ABNORMAL LOW (ref 3.5–5.1)
Sodium: 138 mmol/L (ref 135–145)
Total Bilirubin: 0.6 mg/dL (ref 0.3–1.2)
Total Protein: 5.5 g/dL — ABNORMAL LOW (ref 6.5–8.1)

## 2020-04-01 LAB — DIFFERENTIAL
Abs Immature Granulocytes: 0.1 10*3/uL — ABNORMAL HIGH (ref 0.00–0.07)
Basophils Absolute: 0 10*3/uL (ref 0.0–0.1)
Basophils Relative: 0 %
Eosinophils Absolute: 0.1 10*3/uL (ref 0.0–0.5)
Eosinophils Relative: 1 %
Immature Granulocytes: 1 %
Lymphocytes Relative: 24 %
Lymphs Abs: 2 10*3/uL (ref 0.7–4.0)
Monocytes Absolute: 0.5 10*3/uL (ref 0.1–1.0)
Monocytes Relative: 6 %
Neutro Abs: 5.5 10*3/uL (ref 1.7–7.7)
Neutrophils Relative %: 68 %

## 2020-04-01 LAB — MAGNESIUM: Magnesium: 1.6 mg/dL — ABNORMAL LOW (ref 1.7–2.4)

## 2020-04-01 LAB — GLUCOSE, CAPILLARY
Glucose-Capillary: 101 mg/dL — ABNORMAL HIGH (ref 70–99)
Glucose-Capillary: 106 mg/dL — ABNORMAL HIGH (ref 70–99)
Glucose-Capillary: 110 mg/dL — ABNORMAL HIGH (ref 70–99)
Glucose-Capillary: 111 mg/dL — ABNORMAL HIGH (ref 70–99)
Glucose-Capillary: 124 mg/dL — ABNORMAL HIGH (ref 70–99)
Glucose-Capillary: 125 mg/dL — ABNORMAL HIGH (ref 70–99)

## 2020-04-01 LAB — PHOSPHORUS: Phosphorus: 4.2 mg/dL (ref 2.5–4.6)

## 2020-04-01 LAB — TRIGLYCERIDES: Triglycerides: 107 mg/dL (ref <150)

## 2020-04-01 LAB — PREALBUMIN: Prealbumin: 15.7 mg/dL — ABNORMAL LOW (ref 18–38)

## 2020-04-01 MED ORDER — MAGNESIUM SULFATE 2 GM/50ML IV SOLN
2.0000 g | Freq: Once | INTRAVENOUS | Status: AC
  Administered 2020-04-01: 06:00:00 2 g via INTRAVENOUS
  Filled 2020-04-01: qty 50

## 2020-04-01 MED ORDER — IOHEXOL 300 MG/ML  SOLN
100.0000 mL | Freq: Once | INTRAMUSCULAR | Status: AC | PRN
  Administered 2020-04-01: 100 mL via INTRAVENOUS

## 2020-04-01 MED ORDER — SODIUM CHLORIDE (PF) 0.9 % IJ SOLN
INTRAMUSCULAR | Status: AC
  Filled 2020-04-01: qty 50

## 2020-04-01 MED ORDER — DAKINS (1/4 STRENGTH) 0.125 % EX SOLN
Freq: Two times a day (BID) | CUTANEOUS | Status: AC
  Administered 2020-04-02: 1
  Filled 2020-04-01 (×2): qty 473

## 2020-04-01 MED ORDER — POTASSIUM CHLORIDE 10 MEQ/100ML IV SOLN
10.0000 meq | INTRAVENOUS | Status: AC
  Administered 2020-04-01 (×4): 10 meq via INTRAVENOUS
  Filled 2020-04-01 (×4): qty 100

## 2020-04-01 NOTE — Progress Notes (Signed)
Patient ID: Tracy [name], female   DOB: 09/17/1951, 69 y.o.   MRN: [id number]    20 Days Post-Op  Subjective: Patient awake on vent but very weak.  Husband at bedside and all questions were answered.  ROS: unable on vent  Objective: Vital signs in last 24 hours: Temp:  [97.5 F (36.4 C)-99 F (37.2 C)] 97.5 F (36.4 C) (04/19 0400) Pulse Rate:  [84-100] 84 (04/19 0600) Resp:  [16-32] 21 (04/19 0600) BP: (98-130)/(44-67) 105/50 (04/19 0600) SpO2:  [95 %-100 %] 99 % (04/19 0742) FiO2 (%):  [28 %-30 %] 30 % (04/19 0742) Last BM Date: 03/31/20(per colostomy)  Intake/Output from previous day: 04/18 0701 - 04/19 0700 In: 2010 [NG/GT:2010] Out: 1385 [Urine:1100; Drains:110; Stool:175] Intake/Output this shift: No intake/output data recorded.  PE: Gen: critically ill on trach/vent Abd: soft, midline wound with some fibrinous tissue and fascial separation.  Small amount of blue/green drainage on bandage c/w pseudomonas.  Ostomy working well.  Surgical drain with minimal output.  Left posterior/superior drain with minimal output as well.  Left hip drain with serous drainage with some fibrinous output present.  Lab Results:  Recent Labs    03/30/20 0313 04/01/20 0319  WBC 6.4 8.2  HGB 7.6* 8.3*  HCT 24.9* 27.0*  PLT 332 454*   BMET Recent Labs    03/31/20 0410 04/01/20 0319  NA 139 138  K 3.8 3.1*  CL 104 99  CO2 29 28  GLUCOSE 128* 145*  BUN 36* 41*  CREATININE 0.38* 0.39*  CALCIUM 8.3* 8.2*   PT/INR No results for input(s): LABPROT, INR in the last 72 hours. CMP     Component Value Date/Time   NA 138 04/01/2020 0319   K 3.1 (L) 04/01/2020 0319   CL 99 04/01/2020 0319   CO2 28 04/01/2020 0319   GLUCOSE 145 (H) 04/01/2020 0319   BUN 41 (H) 04/01/2020 0319   CREATININE 0.39 (L) 04/01/2020 0319   CALCIUM 8.2 (L) 04/01/2020 0319   PROT 5.5 (L) 04/01/2020 0319   ALBUMIN 2.0 (L) 04/01/2020 0319   AST 35 04/01/2020 0319   ALT 50 (H) 04/01/2020 0319    ALKPHOS 251 (H) 04/01/2020 0319   BILITOT 0.6 04/01/2020 0319   GFRNONAA >60 04/01/2020 0319   GFRAA >60 04/01/2020 0319   Lipase     Component Value Date/Time   LIPASE 18 03/03/2020 1214       Studies/Results: No results found.  Anti-infectives: Anti-infectives (From admission, onward)   Start     Dose/Rate Route Frequency Ordered Stop   03/18/20 0900  meropenem (MERREM) 1 g in sodium chloride 0.9 % 100 mL IVPB  Status:  Discontinued     1 g 200 mL/hr over 30 Minutes Intravenous Every 8 hours 03/18/20 0838 03/26/20 1106   03/15/20 2200  meropenem (MERREM) 1 g in sodium chloride 0.9 % 100 mL IVPB  Status:  Discontinued     1 g 200 mL/hr over 30 Minutes Intravenous Every 12 hours 03/15/20 1021 03/18/20 0838   03/14/20 1000  anidulafungin (ERAXIS) 100 mg in sodium chloride 0.9 % 100 mL IVPB  Status:  Discontinued     100 mg 78 mL/hr over 100 Minutes Intravenous Every 24 hours 03/13/20 0752 03/18/20 0849   03/13/20 0900  anidulafungin (ERAXIS) 200 mg in sodium chloride 0.9 % 200 mL IVPB     200 mg 78 mL/hr over 200 Minutes Intravenous  Once 03/13/20 0752 03/13/20 1241   03/11/20 2000  meropenem (  MERREM) 500 mg in sodium chloride 0.9 % 100 mL IVPB  Status:  Discontinued     500 mg 200 mL/hr over 30 Minutes Intravenous Every 12 hours 03/11/20 1658 03/15/20 1021   03/11/20 1200  piperacillin-tazobactam (ZOSYN) IVPB 2.25 g  Status:  Discontinued     2.25 g 100 mL/hr over 30 Minutes Intravenous Every 6 hours 03/11/20 1058 03/11/20 1641   03/06/20 1200  piperacillin-tazobactam (ZOSYN) IVPB 3.375 g  Status:  Discontinued     3.375 g 12.5 mL/hr over 240 Minutes Intravenous Every 8 hours 03/06/20 0739 03/11/20 1058   03/05/20 1400  piperacillin-tazobactam (ZOSYN) IVPB 2.25 g  Status:  Discontinued     2.25 g 100 mL/hr over 30 Minutes Intravenous Every 8 hours 03/05/20 0752 03/06/20 0739   03/04/20 2200  piperacillin-tazobactam (ZOSYN) IVPB 3.375 g  Status:  Discontinued     3.375  g 12.5 mL/hr over 240 Minutes Intravenous Every 8 hours 03/03/20 2032 03/05/20 0752   03/03/20 1545  cefoTEtan (CEFOTAN) 2 g in sodium chloride 0.9 % 100 mL IVPB     2 g 200 mL/hr over 30 Minutes Intravenous On call to O.R. 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  Will have TOC start looking at [facility] for possible placement over the next week or so. AKI- Cr normalized ABL anemia -Hgb8.3 Malnutrition - TFat goal, TPN stopped 4/13  Perforated sigmoid colon Proximal rectal cancer with obstruction S/p emergent Low anterior resection/ colostomy by Dr. Marcello Moores 03/03/20  S/p exploratory laparotomy and colon resection with transverse colostomy 3/31 Dr. Hassell Done -wet to drydressing changesto midline abdominal wound, will add Dakin's for 3 days to eradicate pseudomonal colonization. - WOC teamfollowing for ostomy teaching - s/p IR drain x2 4/7,culturePSEUDOMONAS AERUGINOSA, completed course of meropenem, will repeat CT scan today to evaluate all drains - ostomy viable and having bowel function  FEN -NPO, TF at55cc/hr VTE - SCDs,SQ heparin ID - Zosyn3/21>3/29; Eraxis 3/31>>4/5,Merrem 3/29>>4/13 Foley -wick   LOS: 29 days    Henreitta Cea , [facility] 04/01/2020, 9:26 AM Please see Amion for pager number during day hours 7:00am-4:30pm or 7:00am -11:30am on weekends

## 2020-04-01 NOTE — Progress Notes (Signed)
NAME:  Tracy [name], MRN:  [id number], DOB:  09-Jan-1951, LOS: 45 ADMISSION DATE:  03/03/2020, CONSULTATION DATE:  3/27 REFERRING MD:  Ninfa Linden, CHIEF COMPLAINT:  Abdominal pain   Brief History   69 y/o female admitted 3/21 with bowel perforation requiring colostomy and lower anterior resection in setting of newly diagnosed proximal rectal cancer with sigmoid perforation.  Post op developed septic shock, HCAP AKI ileus and acute hypoxemic respiratory failure requiring intubation, tracheostomy.    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 Valeta Harms. Not on O2.  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, metastatic carcinoma in 3/15 lymph nodes  LE [name] 3/27 >> negative for DVT  ECHO 3/27 >> poor windows, normal LV function, severely dilated RV with moderately reduced function, RV volume and pressure overload, mildly dilated RA  V/Q Scan 3/30 >> low probability for PE  4/6 CT chest abdomen pelvis> 5.5x3.2 fluid collection along stomach, surgical drain again noted in pelvis,  interval development of crescent shaped fluid collection measuring 16.4 x 3.3 in epigastric region and left upper quadrant of abdomen (could be abscess) multiple other smaller fluid collections, colostomy noted, mild free fluid in posterior pelvis  Micro Data:  COVID 3/21 >> Negative MRSA PCR 3/21 >> Negative Blood cultures 3/27 >>negative  Tracheal Aspirate 3/29 >> Saccharomyces cerevisiae Trach aspirate 4/2 >> saccharomyces cerevisiae abscess abdomen 4/7 >> pseudomonas  Antimicrobials:  Zosyn 3/21 >> 3/29 Anidulafungin 3/31 >> 4/5 Meropenem 3/29 >> 4/13  Interim history/subjective:  RN reports pt received dilaudid this am for abd pain  Afebrile  Husband at bedside   Objective   Blood pressure (!) 105/50, pulse 84, temperature 97.7 F (36.5 C), temperature source Oral, resp. rate (!) 21, height 5\' 2"  (1.575 m), weight 69.5 kg, last menstrual period 12/14/2000, SpO2 99 %.    Vent Mode: PRVC FiO2 (%):  [28 %-30 %] 30 % Set Rate:  [10 bmp] 10 bmp Vt Set:  [400 mL] 400 mL PEEP:  [5 cmH20] 5 cmH20 Pressure Support:  [15 cmH20] 15 cmH20 Plateau Pressure:  [18 cmH20-20 cmH20] 19 cmH20   Intake/Output Summary (Last 24 hours) at 04/01/2020 1059 Last data filed at 04/01/2020 [id number] Gross per 24 hour  Intake 1810 ml  Output 1385 ml  Net 425 ml   Filed Weights   03/29/20 0500 03/30/20 0500 03/31/20 0500  Weight: 68.4 kg 68.7 kg 69.5 kg    Examination: General: adult female  HEENT: MM pink/moist, trach midline  Neuro: Awakens, interactive, generalized weakness, barely able to lift arms to gravity  CV: s1s2 RRR, no m/r/g  PULM:  Non-labored on vent, lungs bilaterally clear  GI: soft, bsx4 active  Extremities: warm/dry, muscle wasting, generalized 1+ edema  Skin: no rashes or lesions  Resolved Hospital Problem list     Assessment & Plan:   Acute hypoxemic respiratory failure  Failure to wean, s/p tracheostomy Severe COPD/Emphysema -continue slow vent wean, anticipate she  will be a slow liberation from vent  -push PT/OT efforts -trach collar as tolerated  -will need nocturnal vent in the short term  -LTAC when ok with surgery  -continue bronchodilators   Bowel perforation/partial colectomy Colon cancer Moderate malnutrition -post operative needs per CCS -continue enteral feeding  -follow abdominal wound / drain output   Anxiety -clonazepam BID  -PRN xanax   Abdominal abscess with pseudomonas -follow off abx   Hypertension   -resolved, monitor BP trends  Anemia of chronic illness -trend CBC -transfuse for Hgb <7%  Hyperglycemia -SSI   Best practice:  Diet: tube feeding Pain/Anxiety/Delirium protocol (if indicated): yes, RASS target 0 VAP protocol (if indicated): yes DVT prophylaxis: sub q heparin GI prophylaxis: PPI Glucose control: SSI Mobility: PT consult Code Status: DNR    Family Communication: Husband Kasandra Knudsen) updated at bedside 4/19  Disposition: ICU   Labs   CBC: Recent Labs  Lab 03/28/20 1030 03/30/20 0313 04/01/20 0319  WBC 7.1 6.4 8.2  NEUTROABS  --   --  5.5  HGB 7.5* 7.6* 8.3*  HCT 25.0* 24.9* 27.0*  MCV 100.8* 98.0 97.1  PLT 351 332 454*    Basic Metabolic Panel: Recent Labs  Lab 03/26/20 0343 03/28/20 0551 03/30/20 0313 03/31/20 0410 04/01/20 0319  NA 139 141 139 139 138  K 3.8 3.7 3.6 3.8 3.1*  CL 109 110 107 104 99  CO2 24 24 27 29 28   GLUCOSE 118* 127* 122* 128* 145*  BUN 45* 37* 35* 36* 41*  CREATININE 0.43* 0.43* 0.41* 0.38* 0.39*  CALCIUM 8.0* 8.0* 8.0* 8.3* 8.2*  MG 1.9 1.8  --   --  1.6*  PHOS 2.9 2.3*  --   --  4.2   GFR: Estimated Creatinine Clearance: 62.4 mL/min (A) (by C-G formula based on SCr of 0.39 mg/dL (L)). Recent Labs  Lab 03/28/20 1030 03/30/20 0313 04/01/20 0319  WBC 7.1 6.4 8.2    Liver Function Tests: Recent Labs  Lab 03/28/20 0551 04/01/20 0319  AST 38 35  ALT 67* 50*  ALKPHOS 274* 251*  BILITOT 0.7 0.6  PROT 5.1* 5.5*  ALBUMIN 2.0* 2.0*   No results  for input(s): LIPASE, AMYLASE in the last 168 hours. No results for input(s): AMMONIA in the last 168 hours.  ABG    Component Value Date/Time   PHART 7.398 03/16/2020 2245   PCO2ART 45.6 03/16/2020 2245   PO2ART 70.3 (L) 03/16/2020 2245   HCO3 27.7 03/16/2020 2245   TCO2 24 03/12/2020 1513   ACIDBASEDEF 4.8 (H) 03/12/2020 1738   O2SAT 93.5 03/16/2020 2245     Coagulation Profile: No results for input(s): INR, PROTIME in the last 168 hours.  Cardiac Enzymes: No results for input(s): CKTOTAL, CKMB, CKMBINDEX, TROPONINI in the last 168 hours.  HbA1C: Hgb A1c MFr Bld  Date/Time Value Ref Range Status  03/11/2020 05:00 AM 5.9 (H) 4.8 - 5.6 % Final    Comment:    (NOTE) Pre diabetes:          5.7%-6.4% Diabetes:              >6.4% Glycemic control for   <  7.0% adults with diabetes     CBG: Recent Labs  Lab 03/31/20 1605 03/31/20 1952 03/31/20 2355 04/01/20 0334 04/01/20 0742  GLUCAP 100* 142* 112* 125* 101*       Critical care time: 71 minutes    Noe Gens, MSN, NP-C [facility] 04/01/2020, 11:19 AM   Please see [email or web address] for pager details.

## 2020-04-01 NOTE — Progress Notes (Signed)
[facility] Progress Note Patient Name: Tracy [name] DOB: 10-26-1951 MRN: [id number]   Date of Service  04/01/2020  HPI/Events of Note  Low K, Mag. Cal ok. Cr normal.   eICU Interventions  Potassium chloride 10 meq every 1 hr x 4 doses and 2 gm IV Magnesium ordered.     Intervention Category Intermediate Interventions: Electrolyte abnormality - evaluation and management  Elmer Sow 04/01/2020, 6:04 AM

## 2020-04-01 NOTE — Progress Notes (Signed)
Pt weaned for 6.5 hours on 28% trach collar. Tolerated well.

## 2020-04-01 NOTE — Progress Notes (Signed)
    Referring Physician(s): Byerly,F  Supervising Physician: Aletta Edouard  Patient Status:  [facility] - In-pt  Chief Complaint: Abdominal fluid collectionds   Subjective: Pt without acute changes; remains on vent; husband in room   Allergies: Codeine  Medications: Prior to Admission medications   Medication Sig Start Date End Date Taking? Authorizing Provider  acetaminophen (TYLENOL) 325 MG tablet Take 650 mg by mouth every 6 (six) hours as needed for moderate pain.   Yes [provider]  albuterol (PROVENTIL) (2.5 MG/3ML) 0.083% nebulizer solution INHALE 3ML VIA NEBULIZER EVERY 4 HOURS AS NEEDED FOR SHORTNESS OF BREATH 11/23/19  Yes Icard, Bradley L, DO  b complex vitamins tablet Take 1 tablet by mouth daily.   Yes [provider]  Fluticasone-Umeclidin-Vilant (TRELEGY ELLIPTA) 100-62.5-25 MCG/INH AEPB Inhale 1 puff into the lungs daily. 02/15/20  Yes Icard, Bradley L, DO  VITAMIN D, CHOLECALCIFEROL, PO Take 1 tablet by mouth daily.   Yes [provider]     Vital Signs: BP (!) 105/50   Pulse 84   Temp (!) 97.5 F (36.4 C)   Resp (!) 21   Ht 5\' 2"  (1.575 m)   Wt 153 lb 3.5 oz (69.5 kg)   LMP 12/14/2000   SpO2 99%   BMI 28.02 kg/m   Physical Exam awake, on vent/trach; left abd drains intact, OP from drain #1  110 cc's light yellow fluid with some tissue fragments; no OP from drain #2 or right surgical drain    Imaging: No results found.  Labs:  CBC: Recent Labs    03/25/20 0520 03/28/20 1030 03/30/20 0313 04/01/20 0319  WBC 11.9* 7.1 6.4 8.2  HGB 8.5* 7.5* 7.6* 8.3*  HCT 27.6* 25.0* 24.9* 27.0*  PLT 341 351 332 454*    COAGS: Recent Labs    03/09/20 1752  INR 1.3*    BMP: Recent Labs    03/28/20 0551 03/30/20 0313 03/31/20 0410 04/01/20 0319  NA 141 139 139 138  K 3.7 3.6 3.8 3.1*  CL 110 107 104 99  CO2 24 27 29 28   GLUCOSE 127* 122* 128* 145*  BUN 37* 35* 36* 41*  CALCIUM 8.0* 8.0* 8.3* 8.2*  CREATININE 0.43*  0.41* 0.38* 0.39*  GFRNONAA >60 >60 >60 >60  GFRAA >60 >60 >60 >60    LIVER FUNCTION TESTS: Recent Labs    03/24/20 0430 03/25/20 0520 03/28/20 0551 04/01/20 0319  BILITOT 0.6 0.3 0.7 0.6  AST 55* 60* 38 35  ALT 45* 62* 67* 50*  ALKPHOS 212* 182* 274* 251*  PROT 5.2* 5.2* 5.1* 5.5*  ALBUMIN 2.2* 2.2* 2.0* 2.0*    Assessment and Plan: Patient with history of perforated sigmoid colon, proximal rectal cancer with prior LAR/colon resection/colostomy; post opleft abdominal fluid collections, status post drain placement x2 on 4/7;afebrile; drain fl cx- pseudomonas/clostridium; WBC nl; hgb 8.3(7.6), K 3.1- replace; creat 0.39; will plan f/u CT this week    Electronically Signed: D. Rowe Robert, PA-C 04/01/2020, 9:09 AM   I spent a total of 15 minutes at the the patient's bedside AND on the patient's hospital floor or unit, greater than 50% of which was counseling/coordinating care for abdominal fluid collection/abscess drains    Patient ID: Tracy [name], female   DOB: 08-Sep-1951, 69 y.o.   MRN: [id number]

## 2020-04-02 DIAGNOSIS — J9620 Acute and chronic respiratory failure, unspecified whether with hypoxia or hypercapnia: Secondary | ICD-10-CM

## 2020-04-02 LAB — GLUCOSE, CAPILLARY
Glucose-Capillary: 105 mg/dL — ABNORMAL HIGH (ref 70–99)
Glucose-Capillary: 111 mg/dL — ABNORMAL HIGH (ref 70–99)
Glucose-Capillary: 121 mg/dL — ABNORMAL HIGH (ref 70–99)
Glucose-Capillary: 125 mg/dL — ABNORMAL HIGH (ref 70–99)
Glucose-Capillary: 91 mg/dL (ref 70–99)
Glucose-Capillary: 99 mg/dL (ref 70–99)

## 2020-04-02 LAB — CBC
HCT: 25.3 % — ABNORMAL LOW (ref 36.0–46.0)
Hemoglobin: 7.8 g/dL — ABNORMAL LOW (ref 12.0–15.0)
MCH: 30.1 pg (ref 26.0–34.0)
MCHC: 30.8 g/dL (ref 30.0–36.0)
MCV: 97.7 fL (ref 80.0–100.0)
Platelets: 516 10*3/uL — ABNORMAL HIGH (ref 150–400)
RBC: 2.59 MIL/uL — ABNORMAL LOW (ref 3.87–5.11)
RDW: 16.3 % — ABNORMAL HIGH (ref 11.5–15.5)
WBC: 7.8 10*3/uL (ref 4.0–10.5)
nRBC: 0 % (ref 0.0–0.2)

## 2020-04-02 LAB — BASIC METABOLIC PANEL
Anion gap: 8 (ref 5–15)
BUN: 38 mg/dL — ABNORMAL HIGH (ref 8–23)
CO2: 29 mmol/L (ref 22–32)
Calcium: 8 mg/dL — ABNORMAL LOW (ref 8.9–10.3)
Chloride: 100 mmol/L (ref 98–111)
Creatinine, Ser: 0.38 mg/dL — ABNORMAL LOW (ref 0.44–1.00)
GFR calc Af Amer: >60 mL/min (ref >60)
GFR calc non Af Amer: >60 mL/min (ref >60)
Glucose, Bld: 140 mg/dL — ABNORMAL HIGH (ref 70–99)
Potassium: 4 mmol/L (ref 3.5–5.1)
Sodium: 137 mmol/L (ref 135–145)

## 2020-04-02 MED ORDER — BETHANECHOL CHLORIDE 25 MG PO TABS
25.0000 mg | ORAL_TABLET | Freq: Three times a day (TID) | ORAL | Status: DC
  Administered 2020-04-02 – 2020-04-05 (×11): 25 mg
  Filled 2020-04-02 (×13): qty 1

## 2020-04-02 MED ORDER — VITAL HIGH PROTEIN PO LIQD
1000.0000 mL | ORAL | Status: DC
  Administered 2020-04-03 – 2020-04-04 (×2): 1000 mL

## 2020-04-02 MED ORDER — MAGNESIUM SULFATE 2 GM/50ML IV SOLN
2.0000 g | Freq: Once | INTRAVENOUS | Status: AC
  Administered 2020-04-02: 2 g via INTRAVENOUS
  Filled 2020-04-02: qty 50

## 2020-04-02 NOTE — Progress Notes (Signed)
Patient ID: Tracy [name], female   DOB: 03-15-1951, 69 y.o.   MRN: [id number]    21 Days Post-Op  Subjective: Patient awake and unable to void this morning for some reason.  Has been trying for about 20 minutes per husband.  Otherwise weaned about 6 hrs yesterday.    ROS: unable, on vent  Objective: Vital signs in last 24 hours: Temp:  [97.4 F (36.3 C)-99.4 F (37.4 C)] 99.4 F (37.4 C) (04/20 0400) Pulse Rate:  [85-96] 94 (04/20 0500) Resp:  [16-25] 22 (04/20 0500) BP: (90-120)/(37-84) 109/57 (04/20 0500) SpO2:  [95 %-100 %] 99 % (04/20 0734) FiO2 (%):  [28 %-30 %] 30 % (04/20 0734) Weight:  [68.1 kg] 68.1 kg (04/20 0500) Last BM Date: 03/31/20(per colostomy)  Intake/Output from previous day: 04/19 0701 - 04/20 0700 In: 720 [NG/GT:720] Out: 1120 [Urine:800; Drains:60; Stool:260] Intake/Output this shift: No intake/output data recorded.  PE: Gen: critically ill on vent with trach Heart: regular Lungs: few expiratory wheezes noted, but faint, otherwise clear on vent with trach in place Abd: soft, ostomy pouch just changed so no current output.  Midline wound with fascial separation and some fibrin but no pseudomonal odor or discoloration today.  Surgical RLQ drain with minimal serous fluid.  IR drain #1 with cloudy serous fluid with fibrinous exudate present.  IR drain #2 with almost no output at all.  Lab Results:  Recent Labs    04/01/20 0319 04/02/20 0613  WBC 8.2 7.8  HGB 8.3* 7.8*  HCT 27.0* 25.3*  PLT 454* 516*   BMET Recent Labs    04/01/20 0319 04/02/20 0613  NA 138 137  K 3.1* 4.0  CL 99 100  CO2 28 29  GLUCOSE 145* 140*  BUN 41* 38*  CREATININE 0.39* 0.38*  CALCIUM 8.2* 8.0*   PT/INR No results for input(s): LABPROT, INR in the last 72 hours. CMP     Component Value Date/Time   NA 137 04/02/2020 0613   K 4.0 04/02/2020 0613   CL 100 04/02/2020 0613   CO2 29 04/02/2020 0613   GLUCOSE 140 (H) 04/02/2020 0613   BUN 38 (H) 04/02/2020 0613    CREATININE 0.38 (L) 04/02/2020 0613   CALCIUM 8.0 (L) 04/02/2020 0613   PROT 5.5 (L) 04/01/2020 0319   ALBUMIN 2.0 (L) 04/01/2020 0319   AST 35 04/01/2020 0319   ALT 50 (H) 04/01/2020 0319   ALKPHOS 251 (H) 04/01/2020 0319   BILITOT 0.6 04/01/2020 0319   GFRNONAA >60 04/02/2020 0613   GFRAA >60 04/02/2020 0613   Lipase     Component Value Date/Time   LIPASE 18 03/03/2020 1214       Studies/Results: CT ABDOMEN PELVIS W CONTRAST  Result Date: 04/01/2020 CLINICAL DATA:  Abdominal abscess. Status post drain placement. Admitted 3/21 with bowel perforation requiring colostomy and lower anterior resection in setting of newly diagnosed proximal rectal cancer with sigmoid perforation. EXAM: CT ABDOMEN AND PELVIS WITH CONTRAST TECHNIQUE: Multidetector CT imaging of the abdomen and pelvis was performed using the standard protocol following bolus administration of intravenous contrast. CONTRAST:  124mL OMNIPAQUE IOHEXOL 300 MG/ML  SOLN COMPARISON:  03/19/2020 FINDINGS: Lower chest: 17 mm nodular density medial right posterior lung base is new since prior, likely collapse/consolidative disease. Small bilateral pleural effusions noted. Atelectatic changes characterize both lung bases. Hepatobiliary: Interval decrease in fluid seen anterior to the left liver on the previous study. There is a subcapsular 2.0 x 0.7 cm low-density lesion anterior segment IV  today which is probably some residual fluid although underlying parenchymal lesion obscured on the prior study is not entirely excluded. There is no evidence for gallstones, gallbladder wall thickening, or pericholecystic fluid. No intrahepatic or extrahepatic biliary dilation. Pancreas: No focal mass lesion. No dilatation of the main duct. No intraparenchymal cyst. No peripancreatic edema. Spleen: No splenomegaly. No focal mass lesion. Adrenals/Urinary Tract: No adrenal nodule or mass. Kidneys unremarkable. No evidence for hydroureter. The urinary  bladder appears normal for the degree of distention. Stomach/Bowel: Stomach is decompressed by a NG tube. Duodenum is normally positioned as is the ligament of Treitz. No small bowel dilatation. Cecal tip is in the central pelvis as before. Appendix unremarkable. No colonic dilatation. Left abdominal colostomy noted. Vascular/Lymphatic: There is abdominal aortic atherosclerosis without aneurysm. There is no gastrohepatic or hepatoduodenal ligament lymphadenopathy. No retroperitoneal or mesenteric lymphadenopathy. No pelvic sidewall lymphadenopathy. Reproductive: No gross abnormality in the uterus. There is some fluid noted in the vagina. Other: Interval decrease in the anterior left upper quadrant abscess/fluid collection, measuring 13.5 x 1.3 cm today when measured in a similar fashion to the prior study where it was recorded at 16.4 x 3.3 cm. These measurements overestimate the size of the fluid collection given the lens shaped abscess. The anterior left abdominal percutaneous pigtail catheter remains in place with the distal loop formed in the inferior aspect of this collection. Focal fluid collection adjacent to the proximal stomach has decreased in the interval measuring 2.9 x 1.2 cm today compared to 5.5 x 3.2 cm previously. Fluid collection in the left abdomen and left lower quadrant has largely resolved. Small residual component noted left paracolic gutter measuring 2.4 x 2.0 cm. The left percutaneous pigtail catheter with the tip coiled posterior to the left kidney has no substantial fluid around it. Similar appearance of the presacral/cul-de-sac collection measuring 7.1 x 3.3 cm today compared to 7.2 x 3.7 cm when remeasured in a similar fashion on the prior study. Right-sided surgical drain remains in stable position in the posterior pelvis. Musculoskeletal: Diffuse body wall edema again noted. Open midline wound again identified. No worrisome lytic or sclerotic osseous abnormality. IMPRESSION: 1. Overall  generalized interval decrease in size of the multiple intra-abdominal/intrapelvic fluid collections present on the prior study. The anterior left abdominal percutaneous pigtail catheter remains in place with the distal loop formed in the inferior aspect of the dominant anterior collection. 2. Interval near complete resolution of the left abdomen and left lower quadrant fluid collection with a small residual component in the left paracolic gutter. No residual fluid around the tip of the posterior left abdominal percutaneous catheter. 3. Similar appearance of the presacral/cul-de-sac collection. 4. 2.0 x 0.7 cm low-density subcapsular lesion anterior segment IV today which is probably some residual fluid although underlying parenchymal lesion obscured on the prior study is not entirely excluded. Attention on follow-up recommended. 5. 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 Small bilateral pleural effusions are new since prior. 6. Diffuse body wall edema. 7. Aortic Atherosclerosis (ICD10-I70.0). Electronically Signed   By: Misty Stanley M.D.   On: 04/01/2020 12:01    Anti-infectives: Anti-infectives (From admission, onward)   Start     Dose/Rate Route Frequency Ordered Stop   03/18/20 0900  meropenem (MERREM) 1 g in sodium chloride 0.9 % 100 mL IVPB  Status:  Discontinued     1 g 200 mL/hr over 30 Minutes Intravenous Every 8 hours 03/18/20 0838 03/26/20 1106  03/15/20 2200  meropenem (MERREM) 1 g in sodium chloride 0.9 % 100 mL IVPB  Status:  Discontinued     1 g 200 mL/hr over 30 Minutes Intravenous Every 12 hours 03/15/20 1021 03/18/20 0838   03/14/20 1000  anidulafungin (ERAXIS) 100 mg in sodium chloride 0.9 % 100 mL IVPB  Status:  Discontinued     100 mg 78 mL/hr over 100 Minutes Intravenous Every 24 hours 03/13/20 0752 03/18/20 0849   03/13/20 0900  anidulafungin (ERAXIS) 200 mg in sodium chloride 0.9 % 200 mL IVPB     200 mg 78  mL/hr over 200 Minutes Intravenous  Once 03/13/20 0752 03/13/20 1241   03/11/20 2000  meropenem (MERREM) 500 mg in sodium chloride 0.9 % 100 mL IVPB  Status:  Discontinued     500 mg 200 mL/hr over 30 Minutes Intravenous Every 12 hours 03/11/20 1658 03/15/20 1021   03/11/20 1200  piperacillin-tazobactam (ZOSYN) IVPB 2.25 g  Status:  Discontinued     2.25 g 100 mL/hr over 30 Minutes Intravenous Every 6 hours 03/11/20 1058 03/11/20 1641   03/06/20 1200  piperacillin-tazobactam (ZOSYN) IVPB 3.375 g  Status:  Discontinued     3.375 g 12.5 mL/hr over 240 Minutes Intravenous Every 8 hours 03/06/20 0739 03/11/20 1058   03/05/20 1400  piperacillin-tazobactam (ZOSYN) IVPB 2.25 g  Status:  Discontinued     2.25 g 100 mL/hr over 30 Minutes Intravenous Every 8 hours 03/05/20 0752 03/06/20 0739   03/04/20 2200  piperacillin-tazobactam (ZOSYN) IVPB 3.375 g  Status:  Discontinued     3.375 g 12.5 mL/hr over 240 Minutes Intravenous Every 8 hours 03/03/20 2032 03/05/20 0752   03/03/20 1545  cefoTEtan (CEFOTAN) 2 g in sodium chloride 0.9 % 100 mL IVPB     2 g 200 mL/hr over 30 Minutes Intravenous On call to O.R. 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 AKI- Cr normalized ABL anemia -stable Malnutrition - TFat goal, TPN stopped 4/13 Urinary retention - will start urecholine to help with voiding.  If she is unable to void soon, she will require I&O cath  Perforated sigmoid colon Proximal rectal cancer with obstruction POD 31, S/p emergent Low anterior resection/ colostomy by Dr. Marcello Moores 03/03/20  POD 21,S/p exploratory laparotomy and colon resection with transverse  colostomy 3/31 Dr. Hassell Done -wet to drydressing changesto midline abdominal wound, will add Dakin's for 3 days to eradicate pseudomonal colonization. - WOC teamfollowing for ostomy teaching - s/p IR drain x2 4/7,culturePSEUDOMONAS AERUGINOSA, completed course of meropenem -repeat CT shows stable to improvement in fluid collections. -will remove surgical JP drain. -hopefully drain #2 can be removed.  Drain #1 will need to remain due to higher output and fluid collection still present - ostomy viable and having bowel function  FEN -NPO, TF at55cc/hr VTE - SCDs,SQ heparin ID - Zosyn3/21>3/29; Eraxis 3/31>>4/5,Merrem 3/29>>4/13 Foley -wick Dispo - TOC looking into LTACH   LOS: 30 days    Henreitta Cea , [facility] 04/02/2020, 8:23 AM Please see Amion for pager number during day hours 7:00am-4:30pm or 7:00am -11:30am on weekends

## 2020-04-02 NOTE — Progress Notes (Signed)
Nutrition Follow-up  DOCUMENTATION CODES:   Not applicable  INTERVENTION:  - will adjust TF regimen: Vital High Protein @ 55 ml/hr. - this regimen will provide 1320 kcal (95% estimated kcal need), 115 grams protein, and 1103 ml free water.  - free water flush, if desired, to be per MD/NP.     NUTRITION DIAGNOSIS:   Inadequate oral intake related to inability to eat as evidenced by NPO status. -ongoing  GOAL:   Patient will meet greater than or equal to 90% of their needs -met with TF regimen  MONITOR:   Vent status, TF tolerance, Labs, Weight trends  ASSESSMENT:   69 year-old female with medical history of asthma, COPD, hemorrhoids, osteopenia, vitamin D deficiency, and seasonal allergies. She presented to the ED on 3/20 due to abdominal pain with associated nausea and diarrhea. These symptoms have been ongoing for several months.  Significant Events: 3/21- admission; emergent low anterior resection/colostomy formation 3/27- intubation and OGT placement 3/30- ex lap with colon resection and transverse colostomy 4/1- TPN initiation 4/6- double lumen PICC placed in R brachial 4/7- tracheostomy; OGT removed 4/8- R nare NGT placed  4/13- TPN discontinued   Patient was on vent via trach until late morning and is now on ATC with plan to remain on ATC for no longer than 6.5 hours today. She has NGT in place and is receiving Vital High Protein @ 60 ml/hr with 30 ml prostat once/day and 200 ml free water every 4 hours. This regimen is providing 1540 kcal, 141 grams protein, and 2404 ml free water.   Patient's husband is at bedside. He denies any nutrition-related questions or concerns at this time.  Noted moderate pitting edema to BLE and LUE and very deep pitting edema to RUE. Estimated nutrition needs based on this AM's weight as weight has been stable x1 week. Re-estimated protein need d/t patient being 3 weeks out from surgery and now moving toward weaning off of vent support.    Per notes: - partial colectomy - colon cancer - moderate malnutrition - abdominal abscess now off abx    Labs reviewed; CBGs: 111 and 125 mg/dl, BUN: 38 mg/dl, creatinine: 0.38 mg/dl, Ca: 8 mg/dl. Medications reviewed; sliding scale novolog, 40 mg IV protonix/day.   Diet Order:   Diet Order            Diet NPO time specified  Diet effective midnight              EDUCATION NEEDS:   No education needs have been identified at this time  Skin:  Skin Assessment: Skin Integrity Issues: Skin Integrity Issues:: Incisions Incisions: abdomen (3/21 and 3/30)  Last BM:  4/20  Height:   Ht Readings from Last 1 Encounters:  03/09/20 _0  (1.575 m)    Weight:   Wt Readings from Last 1 Encounters:  04/02/20 68.1 kg    Ideal Body Weight:  50 kg  BMI:  Body mass index is 27.46 kg/m.  Estimated Nutritional Needs:   Kcal:  1391 kcal  Protein:  105-122 grams  Fluid:  >/= 1.5 L/day     Jarome Matin, MS, RD, LDN, CNSC Inpatient Clinical Dietitian RD pager # available in AMION  After hours/weekend pager # available in [facility]

## 2020-04-02 NOTE — Progress Notes (Signed)
Pt weaned for 7 hours on 28% ATC.  Tolerated well.

## 2020-04-02 NOTE — TOC Progression Note (Signed)
Transition of Care [facility]) - Progression Note    Patient Details  Name: Tracy [name] MRN: [id number] Date of Birth: [birth year]  Transition of Care [facility]) CM/SW Contact  Leeroy Cha, RN Phone Number: 04/02/2020, 9:14 AM  Clinical Narrative:    Jerrell Mylar and lauren webster/ patient is denied for ltach/per lauren CAM team will contact pa Noe Gens and get information to do peer to peer.   Expected Discharge Plan: [place] Barriers to Discharge: Continued Medical Work up  Expected Discharge Plan and Services Expected Discharge Plan: [place] In-house Referral: Clinical Social Work     Living arrangements for the past 2 months: Single Family Home                                       Social Determinants of Health (SDOH) Interventions    Readmission Risk Interventions Readmission Risk Prevention Plan 03/25/2020 03/04/2020  Medication Screening - Complete  Transportation Screening Complete Complete  Medication Review (RN Care Manager) Complete -  Some recent data might be hidden

## 2020-04-02 NOTE — Progress Notes (Signed)
Supervising Physician: Jacqulynn Cadet  Patient Status:  [facility] - In-pt  Chief Complaint:  Intra abdominal fluid collections  Subjective: 69 y.o, female inpatient. History of rectal cancer admitted for bowel perforation s/p ex lap on 123XX123 complication by septic shock. IR placed 2 drains to left abdomen on 4.7.21. Patient laying in bed, sleeping no distress noted.   Allergies: Codeine  Medications: Prior to Admission medications   Medication Sig Start Date End Date Taking? Authorizing Provider  acetaminophen (TYLENOL) 325 MG tablet Take 650 mg by mouth every 6 (six) hours as needed for moderate pain.   Yes [provider]  albuterol (PROVENTIL) (2.5 MG/3ML) 0.083% nebulizer solution INHALE 3ML VIA NEBULIZER EVERY 4 HOURS AS NEEDED FOR SHORTNESS OF BREATH 11/23/19  Yes Icard, Bradley L, DO  b complex vitamins tablet Take 1 tablet by mouth daily.   Yes [provider]  Fluticasone-Umeclidin-Vilant (TRELEGY ELLIPTA) 100-62.5-25 MCG/INH AEPB Inhale 1 puff into the lungs daily. 02/15/20  Yes Icard, Bradley L, DO  VITAMIN D, CHOLECALCIFEROL, PO Take 1 tablet by mouth daily.   Yes [provider]     Vital Signs: BP (!) 104/40   Pulse 88   Temp 97.9 F (36.6 C) (Oral)   Resp 19   Ht 5\' 2"  (1.575 m)   Wt 150 lb 2.1 oz (68.1 kg)   LMP 12/14/2000   SpO2 100%   BMI 27.46 kg/m   Physical Exam Vitals and nursing note reviewed.  Constitutional:      Appearance: She is well-developed.  HENT:     Head: Normocephalic and atraumatic.  Eyes:     Conjunctiva/sclera: Conjunctivae normal.  Pulmonary:     Effort: Pulmonary effort is normal.  Abdominal:     Palpations: Abdomen is soft.     Tenderness: There is no abdominal tenderness.     Comments: Positive LLQ drain (#1) drain to suction  site is unremarkable with no erythema, edema, tenderness, bleeding or drainage noted at exit site. Suture in place. Dressing is clean dry and intact. 20 ml of  Hazy  yellow colored fluid noted in bulb suction device.    Musculoskeletal:        General: Normal range of motion.     Cervical back: Normal range of motion.  Skin:    General: Skin is warm.  Neurological:     Mental Status: She is oriented to person, place, and time.     Imaging: CT ABDOMEN PELVIS W CONTRAST  Result Date: 04/01/2020 CLINICAL DATA:  Abdominal abscess. Status post drain placement. Admitted 3/21 with bowel perforation requiring colostomy and lower anterior resection in setting of newly diagnosed proximal rectal cancer with sigmoid perforation. EXAM: CT ABDOMEN AND PELVIS WITH CONTRAST TECHNIQUE: Multidetector CT imaging of the abdomen and pelvis was performed using the standard protocol following bolus administration of intravenous contrast. 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 Small bilateral pleural effusions noted. Atelectatic changes characterize both lung bases. Hepatobiliary: Interval decrease in fluid seen anterior to the left liver on the previous study. There is a subcapsular 2.0 x 0.7 cm low-density lesion anterior segment IV today which is probably some residual fluid although underlying parenchymal lesion obscured on the prior study is not entirely excluded. There is no evidence for gallstones, gallbladder wall thickening, or pericholecystic fluid. No intrahepatic or extrahepatic biliary dilation. Pancreas:  No focal mass lesion. No dilatation of the main duct. No intraparenchymal cyst. No peripancreatic edema. Spleen: No splenomegaly. No focal mass lesion. Adrenals/Urinary Tract: No adrenal nodule or mass. Kidneys unremarkable. No evidence for hydroureter. The urinary bladder appears normal for the degree of distention. Stomach/Bowel: Stomach is decompressed by a NG tube. Duodenum is normally positioned as  is the ligament of Treitz. No small bowel dilatation. Cecal tip is in the central pelvis as before. Appendix unremarkable. No colonic dilatation. Left abdominal colostomy noted. Vascular/Lymphatic: There is abdominal aortic atherosclerosis without aneurysm. There is no gastrohepatic or hepatoduodenal ligament lymphadenopathy. No retroperitoneal or mesenteric lymphadenopathy. No pelvic sidewall lymphadenopathy. Reproductive: No gross abnormality in the uterus. There is some fluid noted in the vagina. Other: Interval decrease in the anterior left upper quadrant abscess/fluid collection, measuring 13.5 x 1.3 cm today when measured in a similar fashion to the prior study where it was recorded at 16.4 x 3.3 cm. These measurements overestimate the size of the fluid collection given the lens shaped abscess. The anterior left abdominal percutaneous pigtail catheter remains in place with the distal loop formed in the inferior aspect of this collection. Focal fluid collection adjacent to the proximal stomach has decreased in the interval measuring 2.9 x 1.2 cm today compared to 5.5 x 3.2 cm previously. Fluid collection in the left abdomen and left lower quadrant has largely resolved. Small residual component noted left paracolic gutter measuring 2.4 x 2.0 cm. The left percutaneous pigtail catheter with the tip coiled posterior to the left kidney has no substantial fluid around it. Similar appearance of the presacral/cul-de-sac collection measuring 7.1 x 3.3 cm today compared to 7.2 x 3.7 cm when remeasured in a similar fashion on the prior study. Right-sided surgical drain remains in stable position in the posterior pelvis. Musculoskeletal: Diffuse body wall edema again noted. Open midline wound again identified. No worrisome lytic or sclerotic osseous abnormality. IMPRESSION: 1. Overall generalized interval decrease in size of the multiple intra-abdominal/intrapelvic fluid collections present on the prior study. The anterior  left abdominal percutaneous pigtail catheter remains in place with the distal loop formed in the inferior aspect of the dominant anterior collection. 2. Interval near complete resolution of the left abdomen and left lower quadrant fluid collection with a small residual component in the left paracolic gutter. No residual fluid around the tip of the posterior left abdominal percutaneous catheter. 3. Similar appearance of the presacral/cul-de-sac collection. 4. 2.0 x 0.7 cm low-density subcapsular lesion anterior segment IV today which is probably some residual fluid although underlying parenchymal lesion obscured on the prior study is not entirely excluded. Attention on follow-up recommended. 5. 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 Small bilateral pleural effusions are new since prior. 6. Diffuse body wall edema. 7. Aortic Atherosclerosis (ICD10-I70.0). Electronically Signed   By: Misty Stanley M.D.   On: 04/01/2020 12:01    Labs:  CBC: Recent Labs    03/28/20 1030 03/30/20 0313 04/01/20 0319 04/02/20 0613  WBC 7.1 6.4 8.2 7.8  HGB 7.5* 7.6* 8.3* 7.8*  HCT 25.0* 24.9* 27.0* 25.3*  PLT 351 332 454* 516*    COAGS: Recent Labs    03/09/20 1752  INR 1.3*    BMP: Recent Labs    03/30/20 0313 03/31/20 0410 04/01/20 0319 04/02/20 0613  NA 139 139 138 137  K 3.6 3.8 3.1* 4.0  CL 107 104 99 100  CO2 27 29 28 29   GLUCOSE 122*  128* 145* 140*  BUN 35* 36* 41* 38*  CALCIUM 8.0* 8.3* 8.2* 8.0*  CREATININE 0.41* 0.38* 0.39* 0.38*  GFRNONAA >60 >60 >60 >60  GFRAA >60 >60 >60 >60    LIVER FUNCTION TESTS: Recent Labs    03/24/20 0430 03/25/20 0520 03/28/20 0551 04/01/20 0319  BILITOT 0.6 0.3 0.7 0.6  AST 55* 60* 38 35  ALT 45* 62* 67* 50*  ALKPHOS 212* 182* 274* 251*  PROT 5.2* 5.2* 5.1* 5.5*  ALBUMIN 2.2* 2.2* 2.0* 2.0*    Assessment and Plan:   69 y.o, female inpatient. History of rectal cancer admitted for  bowel perforation s/p ex lap on 123XX123 complication by septic shock. IR placed 2 drains to left abdomen on 4.7.21. Per Epic output is:  .Drain 1 - 60 ml, 100 ml, 125 ml,  Drain 2 - 0 ml, 76ml, 0 ml  Pertinent Imaging 4.19.21 - Ct abd  Pelvis   Pertinent IR History 4.7.21 - Abscess drain placement LLQ and LUQ  Pertinent Allergies codeine  Cr 0.38 All other labs and medications are within acceptable parameters.   After review of imaging from CT dated 4.19.21 and correlation with output drain number 2 removed at bedside.   Recommend team continue with flushing TID, output recording q shift and dressing changes as needed. Would consider additional imaging when output is less than 10 ml for 24 hours not including flush material.     Electronically Signed: Avel Peace, NP 04/02/2020, 1:42 PM   I spent a total of 15 minute at the the patient's bedside AND on the patient's hospital floor or unit, greater than 50% of which was counseling/coordinating care for intra abdominal abscess drain evaluation and subsequent removal of drain #1.

## 2020-04-02 NOTE — Progress Notes (Signed)
NAME:  Tracy [name], MRN:  [id number], DOB:  March 29, 1951, LOS: 24 ADMISSION DATE:  03/03/2020, CONSULTATION DATE:  3/27 REFERRING MD:  Ninfa Linden, CHIEF COMPLAINT:  Abdominal pain   Brief History   69 y/o female admitted 3/21 with bowel perforation requiring colostomy and lower anterior resection in setting of newly diagnosed proximal rectal cancer with sigmoid perforation.  Post op developed septic shock, HCAP AKI ileus and acute hypoxemic respiratory failure requiring intubation, tracheostomy.    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 Valeta Harms. Not on O2.  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 Hassell Done 4/03 weaning on PSV for 8 hours 4/04 weaning on PSV for 3 hours 4/07 tracheostomy and epigastric fluid collection drain placed 4/14 Tolerated ATC 4/19 awake, interactive, reportedly failed ATC trial 4/18.  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, metastatic carcinoma in 3/15 lymph nodes  LE [name] 3/27 >> negative for DVT  ECHO 3/27 >> poor windows, normal LV function, severely dilated RV with moderately reduced function, RV volume and pressure overload, mildly dilated RA  V/Q Scan 3/30 >> low probability for PE  4/6 CT chest abdomen pelvis> 5.5x3.2 fluid collection along stomach,  surgical drain again noted in pelvis, interval development of crescent shaped fluid collection measuring 16.4 x 3.3 in epigastric region and left upper quadrant of abdomen (could be abscess) multiple other smaller fluid collections, colostomy noted, mild free fluid in posterior pelvis  Micro Data:  COVID 3/21 >> Negative MRSA PCR 3/21 >> Negative Blood cultures 3/27 >>negative  Tracheal Aspirate 3/29 >> Saccharomyces cerevisiae Trach aspirate 4/2 >> saccharomyces cerevisiae Abscess abdomen 4/7 >> pseudomonas  Antimicrobials:  Zosyn 3/21 >> 3/29 Anidulafungin 3/31 >> 4/5 Meropenem 3/29 >> 4/13  Interim history/subjective:  RT notes pt weaned 6.5hours on 28% ATC 4/19 Tmax 99.4 / WBC 7.8 Glucose range 111-140 I/O 800 ml UOP, -461ml in last 24 hours  Pt reports she feels as if she needs to urinate  Objective   Blood pressure (!) 109/57, pulse 94, temperature 99.4 F (37.4 C), temperature source Axillary, resp. rate (!) 22, height 5\' 2"  (1.575 m), weight 68.1 kg, last menstrual period 12/14/2000, SpO2 99 %.    Vent Mode: PRVC FiO2 (%):  [28 %-30 %] 30 % Set Rate:  [10 bmp] 10 bmp Vt Set:  [400 mL] 400 mL PEEP:  [5 cmH20] 5 cmH20 Plateau Pressure:  [13 cmH20-21 cmH20] 19 cmH20   Intake/Output Summary (Last 24 hours) at 04/02/2020 0753 Last data filed at 04/02/2020 0600 Gross per 24 hour  Intake 720 ml  Output 1120 ml  Net -400 ml   Filed Weights   03/30/20 0500 03/31/20 0500 04/02/20 0500  Weight: 68.7 kg 69.5 kg 68.1 kg    Examination: General: chronically ill appearing adult female lying in bed in  NAD HEENT: MM pink/moist, trach midline c/d/i, pupils =/reactive  Neuro: Awake, alert, interactive, generalized weakness CV: s1s2 rrr, no m/r/g PULM: non-labored on vent, lungs bilaterally with wheezing GI: soft, bsx4 active  Extremities: warm/dry, generalized 1+ edema, muscle wasting Skin: no rashes or lesions  Resolved Hospital Problem list     Assessment & Plan:    Acute hypoxemic respiratory failure  Failure to wean, s/p tracheostomy Severe COPD/Emphysema -push vent weaning efforts, goal no more than 6.5 hours on ATC today -push PT/OT efforts  -OOB to chair  -ATC as above during daytime, needs nocturnal vent for now -continue bronchodilators  Bowel perforation/partial colectomy Colon cancer Moderate malnutrition -post operative care per CCS -follow abdominal wound -push enteral nutrition  Anxiety -continue clonazepam BID, PRN xanax   Abdominal abscess with pseudomonas -follow off abx.  Afebrile, no leukocytosis.  Hypertension   -BP controlled, monitor trends in ICU  Anemia of chronic illness -trend CBC  -transfuse for Hgb <7%  Hyperglycemia -SSI   Best practice:  Diet: tube feeding Pain/Anxiety/Delirium protocol (if indicated): yes, RASS target 0 VAP protocol (if indicated): yes DVT prophylaxis: sub q heparin GI prophylaxis: PPI Glucose control: SSI Mobility: PT consult Code Status: DNR    Family Communication: Husband Kasandra Knudsen) updated at bedside 4/20 Disposition: ICU   Labs   CBC: Recent Labs  Lab 03/28/20 1030 03/30/20 0313 04/01/20 0319 04/02/20 0613  WBC 7.1 6.4 8.2 7.8  NEUTROABS  --   --  5.5  --   HGB 7.5* 7.6* 8.3* 7.8*  HCT 25.0* 24.9* 27.0* 25.3*  MCV 100.8* 98.0 97.1 97.7  PLT 351 332 454* 516*    Basic Metabolic Panel: Recent Labs  Lab 03/28/20 0551 03/30/20 0313 03/31/20 0410 04/01/20 0319 04/02/20 0613  NA 141 139 139 138 137  K 3.7 3.6 3.8 3.1* 4.0  CL 110 107 104 99 100  CO2 24 27 29 28 29   GLUCOSE 127* 122* 128* 145* 140*  BUN 37* 35* 36* 41* 38*  CREATININE 0.43* 0.41* 0.38* 0.39* 0.38*  CALCIUM 8.0* 8.0* 8.3* 8.2* 8.0*  MG 1.8  --   --  1.6*  --   PHOS 2.3*  --   --  4.2  --    GFR: Estimated Creatinine Clearance: 61.7 mL/min (A) (by C-G formula based on SCr of 0.38 mg/dL (L)). Recent Labs  Lab 03/28/20 1030 03/30/20 0313 04/01/20 0319 04/02/20 0613  WBC 7.1 6.4 8.2  7.8    Liver Function Tests: Recent Labs  Lab 03/28/20 0551 04/01/20 0319  AST 38 35  ALT 67* 50*  ALKPHOS 274* 251*  BILITOT 0.7 0.6  PROT 5.1* 5.5*  ALBUMIN 2.0* 2.0*   No results for input(s): LIPASE, AMYLASE in the last 168 hours. No results for input(s): AMMONIA in the last 168 hours.  ABG    Component Value Date/Time   PHART 7.398 03/16/2020 2245   PCO2ART 45.6 03/16/2020 2245   PO2ART 70.3 (L) 03/16/2020 2245   HCO3 27.7 03/16/2020 2245   TCO2 24 03/12/2020 1513   ACIDBASEDEF 4.8 (H) 03/12/2020 1738   O2SAT 93.5 03/16/2020 2245     Coagulation Profile: No results for input(s): INR, PROTIME in the last 168 hours.  Cardiac Enzymes: No results for input(s): CKTOTAL, CKMB, CKMBINDEX, TROPONINI in the last 168 hours.  HbA1C: Hgb A1c MFr Bld  Date/Time Value Ref Range Status  03/11/2020 05:00 AM 5.9 (H) 4.8 - 5.6 % Final    Comment:    (NOTE) Pre diabetes:  5.7%-6.4% Diabetes:              >6.4% Glycemic control for   <7.0% adults with diabetes     CBG: Recent Labs  Lab 04/01/20 1210 04/01/20 1628 04/01/20 1957 04/01/20 2347 04/02/20 0354  GLUCAP 106* 124* 111* 110* 111*       Critical care time: 33 minutes    Noe Gens, MSN, NP-C [facility] 04/02/2020, 7:53 AM   Please see [email or web address] for pager details.

## 2020-04-03 ENCOUNTER — Inpatient Hospital Stay (HOSPITAL_COMMUNITY): Payer: Medicare HMO

## 2020-04-03 LAB — GLUCOSE, CAPILLARY
Glucose-Capillary: 102 mg/dL — ABNORMAL HIGH (ref 70–99)
Glucose-Capillary: 100 mg/dL — ABNORMAL HIGH (ref 70–99)
Glucose-Capillary: 127 mg/dL — ABNORMAL HIGH (ref 70–99)
Glucose-Capillary: 99 mg/dL (ref 70–99)
Glucose-Capillary: 104 mg/dL — ABNORMAL HIGH (ref 70–99)
Glucose-Capillary: 99 mg/dL (ref 70–99)

## 2020-04-03 MED ORDER — OXYCODONE HCL 5 MG/5ML PO SOLN
5.0000 mg | Freq: Four times a day (QID) | ORAL | Status: DC | PRN
  Administered 2020-04-03: 5 mg via ORAL
  Filled 2020-04-03: qty 5

## 2020-04-03 NOTE — Progress Notes (Signed)
Referring Physician(s): Barry Dienes, F.  Supervising Physician: Jacqulynn Cadet  Patient Status:  [facility] - In-pt  Chief Complaint:  Intra-abdominal fluid collection  Subjective:  69 year old female, inpatient. History of rectal cancer admitted for bowel perforation s/p ex lap on [id number] complicated by septic shock. IR placed 2 drains to left abdomen on 4.7.21. Patient laying in bed, sleeping no distress noted.     Allergies: Codeine  Medications: Prior to Admission medications   Medication Sig Start Date End Date Taking? Authorizing Provider  acetaminophen (TYLENOL) 325 MG tablet Take 650 mg by mouth every 6 (six) hours as needed for moderate pain.   Yes [provider]  albuterol (PROVENTIL) (2.5 MG/3ML) 0.083% nebulizer solution INHALE 3ML VIA NEBULIZER EVERY 4 HOURS AS NEEDED FOR SHORTNESS OF BREATH 11/23/19  Yes Icard, Bradley L, DO  b complex vitamins tablet Take 1 tablet by mouth daily.   Yes [provider]  Fluticasone-Umeclidin-Vilant (TRELEGY ELLIPTA) 100-62.5-25 MCG/INH AEPB Inhale 1 puff into the lungs daily. 02/15/20  Yes Icard, Bradley L, DO  VITAMIN D, CHOLECALCIFEROL, PO Take 1 tablet by mouth daily.   Yes [provider]     Vital Signs: BP (!) 116/49   Pulse 84   Temp 97.9 F (36.6 C) (Oral)   Resp 19   Ht 5\' 2"  (1.575 m)   Wt 151 lb 14.4 oz (68.9 kg)   LMP 12/14/2000   SpO2 100%   BMI 27.78 kg/m   Physical Exam Patient awake and alert, able to nod appropriately to questions. Tracheostomy/trach collar in place. Left JP drain intact, site is clean and dry and without erythema. Dressing is clean, dry and intact. Abdomen is tender.   Imaging: CT ABDOMEN PELVIS W CONTRAST  Result Date: 04/01/2020 CLINICAL DATA:  Abdominal abscess. Status post drain placement. Admitted 3/21 with bowel perforation requiring colostomy and lower anterior resection in setting of newly diagnosed proximal rectal cancer with sigmoid perforation. EXAM: CT  ABDOMEN AND PELVIS WITH CONTRAST TECHNIQUE: Multidetector CT imaging of the abdomen and pelvis was performed using the standard protocol following bolus administration of intravenous contrast. CONTRAST:  160mL OMNIPAQUE IOHEXOL 300 MG/ML  SOLN COMPARISON:  03/19/2020 FINDINGS: Lower chest: 17 mm nodular density medial right posterior lung base is new since prior, likely collapse/consolidative disease. Small bilateral pleural effusions noted. Atelectatic changes characterize both lung bases. Hepatobiliary: Interval decrease in fluid seen anterior to the left liver on the previous study. There is a subcapsular 2.0 x 0.7 cm low-density lesion anterior segment IV today which is probably some residual fluid although underlying parenchymal lesion obscured on the prior study is not entirely excluded. There is no evidence for gallstones, gallbladder wall thickening, or pericholecystic fluid. No intrahepatic or extrahepatic biliary dilation. Pancreas: No focal mass lesion. No dilatation of the main duct. No intraparenchymal cyst. No peripancreatic edema. Spleen: No splenomegaly. No focal mass lesion. Adrenals/Urinary Tract: No adrenal nodule or mass. Kidneys unremarkable. No evidence for hydroureter. The urinary bladder appears normal for the degree of distention. Stomach/Bowel: Stomach is decompressed by a NG tube. Duodenum is normally positioned as is the ligament of Treitz. No small bowel dilatation. Cecal tip is in the central pelvis as before. Appendix unremarkable. No colonic dilatation. Left abdominal colostomy noted. Vascular/Lymphatic: There is abdominal aortic atherosclerosis without aneurysm. There is no gastrohepatic or hepatoduodenal ligament lymphadenopathy. No retroperitoneal or mesenteric lymphadenopathy. No pelvic sidewall lymphadenopathy. Reproductive: No gross abnormality in the uterus. There is some fluid noted in the vagina. Other: Interval decrease  in the anterior left upper quadrant abscess/fluid  collection, measuring 13.5 x 1.3 cm today when measured in a similar fashion to the prior study where it was recorded at 16.4 x 3.3 cm. These measurements overestimate the size of the fluid collection given the lens shaped abscess. The anterior left abdominal percutaneous pigtail catheter remains in place with the distal loop formed in the inferior aspect of this collection. Focal fluid collection adjacent to the proximal stomach has decreased in the interval measuring 2.9 x 1.2 cm today compared to 5.5 x 3.2 cm previously. Fluid collection in the left abdomen and left lower quadrant has largely resolved. Small residual component noted left paracolic gutter measuring 2.4 x 2.0 cm. The left percutaneous pigtail catheter with the tip coiled posterior to the left kidney has no substantial fluid around it. Similar appearance of the presacral/cul-de-sac collection measuring 7.1 x 3.3 cm today compared to 7.2 x 3.7 cm when remeasured in a similar fashion on the prior study. Right-sided surgical drain remains in stable position in the posterior pelvis. Musculoskeletal: Diffuse body wall edema again noted. Open midline wound again identified. No worrisome lytic or sclerotic osseous abnormality. IMPRESSION: 1. Overall generalized interval decrease in size of the multiple intra-abdominal/intrapelvic fluid collections present on the prior study. The anterior left abdominal percutaneous pigtail catheter remains in place with the distal loop formed in the inferior aspect of the dominant anterior collection. 2. Interval near complete resolution of the left abdomen and left lower quadrant fluid collection with a small residual component in the left paracolic gutter. No residual fluid around the tip of the posterior left abdominal percutaneous catheter. 3. Similar appearance of the presacral/cul-de-sac collection. 4. 2.0 x 0.7 cm low-density subcapsular lesion anterior segment IV today which is probably some residual fluid although  underlying parenchymal lesion obscured on the prior study is not entirely excluded. Attention on follow-up recommended. 5. 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 Small bilateral pleural effusions are new since prior. 6. Diffuse body wall edema. 7. Aortic Atherosclerosis (ICD10-I70.0). Electronically Signed   By: Misty Stanley M.D.   On: 04/01/2020 12:01   DG CHEST PORT 1 VIEW  Result Date: 04/03/2020 CLINICAL DATA:  Respiratory failure EXAM: PORTABLE CHEST 1 VIEW COMPARISON:  03/25/2020 FINDINGS: The tracheostomy tube, NG tube and right PICC lines are stable. Underlying emphysematous changes and upper lobe scarring. No infiltrates or effusions. No pneumothorax. IMPRESSION: Stable support apparatus. Emphysematous changes and upper lobe pulmonary scarring without definite infiltrates or effusions. Electronically Signed   By: Marijo Sanes M.D.   On: 04/03/2020 07:06    Labs:  CBC: Recent Labs    03/28/20 1030 03/30/20 0313 04/01/20 0319 04/02/20 0613  WBC 7.1 6.4 8.2 7.8  HGB 7.5* 7.6* 8.3* 7.8*  HCT 25.0* 24.9* 27.0* 25.3*  PLT 351 332 454* 516*    COAGS: Recent Labs    03/09/20 1752  INR 1.3*    BMP: Recent Labs    03/30/20 0313 03/31/20 0410 04/01/20 0319 04/02/20 0613  NA 139 139 138 137  K 3.6 3.8 3.1* 4.0  CL 107 104 99 100  CO2 27 29 28 29   GLUCOSE 122* 128* 145* 140*  BUN 35* 36* 41* 38*  CALCIUM 8.0* 8.3* 8.2* 8.0*  CREATININE 0.41* 0.38* 0.39* 0.38*  GFRNONAA >60 >60 >60 >60  GFRAA >60 >60 >60 >60    LIVER FUNCTION TESTS: Recent Labs    03/24/20 0430 03/25/20 0520 03/28/20 0551  04/01/20 0319  BILITOT 0.6 0.3 0.7 0.6  AST 55* 60* 38 35  ALT 45* 62* 67* 50*  ALKPHOS 212* 182* 274* 251*  PROT 5.2* 5.2* 5.1* 5.5*  ALBUMIN 2.2* 2.2* 2.0* 2.0*    Assessment and Plan: 69 y.o, female inpatient. History of rectal cancer admitted for bowel perforation s/p ex lap on 123XX123 complication by  septic shock. IR placed 2 drains to left abdomen on 4.7.21. Drain #2 was removed 04/02/2020 at the bedside. Drain #1 is intact with output of approximately 145 ml in the last 12 hours. Will continue to monitor output until minimal output noted and drain can be removed. Continue flushing TID, output recording every shift and dressing changes as needed.    Electronically Signed: Theresa Duty, NP 04/03/2020, 10:48 AM   I spent a total of 15 Minutes at the the patient's bedside AND on the patient's hospital floor or unit, greater than 50% of which was counseling/coordinating care for left abdominal drain.    Patient ID: Tracy [name], female   DOB: Mar 06, 1951, 69 y.o.   MRN: [id number]

## 2020-04-03 NOTE — Progress Notes (Signed)
Physical Therapy Treatment Patient Details Name: Tracy [name] MRN: [id number] DOB: 1951/07/15 Today's Date: 04/03/2020    History of Present Illness Tracy [name] is an 69 y.o. female who is here for abd pain,  diarrhea for several weeks. Found to have bowel perforation, perforated viscus.   S/Plow anterior resection end colostomy on 03/03/20.  Post-op pt developed septic shock, HCAP, AKI ileus and acute hypoxemic respiratory failure requiring intubation.  Pt continues on vent but with weaning initiated and pt alert to following cues and nodding/shaking head to respond to questions.    PT Comments     the patient is in recliner via lift. Patient did assist intermittently with AAROM all extremities. Spouse present and reports that he is assisting with ROM. Blue ball provided. Continue mobility and  Exercises as able.  Follow Up Recommendations  LTACH     Equipment Recommendations  None recommended by PT    Recommendations for Other Services       Precautions / Restrictions Precautions Precautions: Fall Precaution Comments: colostomt, right JP, trach collar/vent    Mobility  Bed Mobility               General bed mobility comments: in recliner via lift by nursing  Transfers                    Ambulation/Gait                 Stairs             Wheelchair Mobility    Modified Rankin (Stroke Patients Only)       Balance       Sitting balance - Comments: in recliner, shifted to right, repositioned                                    Cognition Arousal/Alertness: Lethargic Behavior During Therapy: Flat affect                                   General Comments: on TC , mouthing words, difficult to read  lips      Exercises General Exercises - Upper Extremity Shoulder Flexion: AAROM;PROM;Both;Supine;10 reps Shoulder ABduction: AAROM;PROM;Both;10 reps;Supine Elbow Flexion: AAROM;PROM;Both;Supine;10  reps Elbow Extension: AAROM;PROM;Both;Supine;10 reps Wrist Flexion: AAROM;PROM;Both;5 reps;Supine;10 reps Wrist Extension: AAROM;PROM;Both;Supine;10 reps General Exercises - Lower Extremity Ankle Circles/Pumps: AAROM;PROM;Both;5 reps;Supine Heel Slides: PROM;Both;5 reps;Supine Hip ABduction/ADduction: AAROM;PROM;Both;5 reps;Supine    General Comments        Pertinent Vitals/Pain Faces Pain Scale: Hurts little more Pain Location: grimaces with Right  shoulder Pain Descriptors / Indicators: Grimacing Pain Intervention(s): Monitored during session    Home Living                      Prior Function            PT Goals (current goals can now be found in the care plan section) Progress towards PT goals: Progressing toward goals    Frequency    Min 2X/week      PT Plan Current plan remains appropriate    Co-evaluation              AM-PAC PT "6 Clicks" Mobility   Outcome Measure  Help needed turning from your back to your side while in a flat bed without using  bedrails?: Total Help needed moving from lying on your back to sitting on the side of a flat bed without using bedrails?: Total Help needed moving to and from a bed to a chair (including a wheelchair)?: Total Help needed standing up from a chair using your arms (e.g., wheelchair or bedside chair)?: Total Help needed to walk in hospital room?: Total Help needed climbing 3-5 steps with a railing? : Total 6 Click Score: 6    End of Session Equipment Utilized During Treatment: Oxygen Activity Tolerance: Patient limited by fatigue;Patient limited by lethargy Patient left: in chair;with family/visitor present Nurse Communication: Mobility status;Need for lift equipment PT Visit Diagnosis: Muscle weakness (generalized) (M62.81)     Time: [id number] PT Time Calculation (min) (ACUTE ONLY): 30 min  Charges:  $Therapeutic Exercise: 23-37 mins                     Tresa Endo PT Acute Rehabilitation  Services Pager [phone] Office [phone]    Claretha Cooper 04/03/2020, 4:09 PM

## 2020-04-03 NOTE — Progress Notes (Signed)
NAME:  Tracy [name], MRN:  [id number], DOB:  November 08, 1951, LOS: 94 ADMISSION DATE:  03/03/2020, CONSULTATION DATE:  3/27 REFERRING MD:  Ninfa Linden, CHIEF COMPLAINT:  Abdominal pain   Brief History   69 y/o female admitted 3/21 with bowel perforation requiring colostomy and lower anterior resection in setting of newly diagnosed proximal rectal cancer with sigmoid perforation.  Post op developed septic shock, HCAP AKI ileus and acute hypoxemic respiratory failure requiring intubation.   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 Valeta Harms. Not on O2.  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, metastatic carcinoma in 3/15 lymph nodes  LE [name] 3/27 >> negative for DVT  ECHO 3/27 >> poor windows, normal LV function, severely dilated RV with moderately reduced function, RV volume and pressure overload, mildly dilated RA  V/Q Scan 3/30 >> low probability for PE  4/6 CT chest abdomen pelvis> 5.5x3.2 fluid collection along stomach, surgical drain again noted in pelvis, interval development of crescent shaped fluid  collection measuring 16.4 x 3.3 in epigastric region and left upper quadrant of abdomen (could be abscess) multiple other smaller fluid collections, colostomy noted, mild free fluid in posterior pelvis  Micro Data:  COVID 3/21 >> Negative MRSA PCR 3/21 >> Negative Blood cultures 3/27 >>negative  Tracheal Aspirate 3/29 >> Saccharomyces cerevisiae Trach aspirate 4/2 >> saccharomyces cerevisiae abscess abdomen 4/7 >> pseudomonas  Antimicrobials:  Zosyn 3/21 >> 3/29 Anidulafungin 3/31 >> 4/5 Meropenem 3/29 >> 4/13  Interim history/subjective:   Weaned on trach collar on 4/20 for 7 hours  Objective   Blood pressure (!) 116/49, pulse 84, temperature 97.9 F (36.6 C), temperature source Oral, resp. rate 19, height 5' 2"  (1.575 m), weight 68.9 kg, last menstrual period 12/14/2000, SpO2 100 %.    Vent Mode: PRVC FiO2 (%):  [28 %-30 %] 30 % Set Rate:  [10 bmp] 10 bmp Vt Set:  [400 mL] 400 mL PEEP:  [5 cmH20] 5 cmH20 Plateau Pressure:  [14 cmH20-20 cmH20] 20 cmH20   Intake/Output Summary (Last 24 hours) at 04/03/2020 0830 Last data filed at 04/03/2020 0500 Gross per 24 hour  Intake 40.14 ml  Output 945 ml  Net -904.86 ml   Filed Weights   03/31/20 0500 04/02/20 0500 04/03/20 0500  Weight: 69.5 kg 68.1 kg 68.9 kg    Examination:  General:  In bed on vent HENT: NCAT trach in place PULM: Poor air movement, vent supported breathing CV: RRR, no mgr GI: BS+, soft, nontender MSK: normal bulk and tone Neuro: sedated on vent   Resolved Hospital Problem list     Assessment & Plan:  Acute hypoxemic respiratory failure  Failure to wean, s/p tracheostomy Severe COPD/Emphysema Continue bronchodilator and inhaled corticosteroids Trach collar> trial again today, goal 8 hours Resume ventilator at night Agree with plans for LTAC, suspect she could be liberated from the vent in less than 3-4 weeks  Bowel perforation/partial colectomy Colon cancer Moderate malnutrition Continue  enteric feeding per general surgery recommendations  Anxiety Continue low dose clonazepam bid PRN Xanax   Abdominal abscess with pseudomonas Monitor off antibiotics   Hypertension : resolved Monitor  Anemia of chronic illness Monitor for bleeding Transfuse PRBC for Hgb < 7 gm/dL  Hyperglycemia SSI accuchecks   Best practice:  Diet: tube feeding Pain/Anxiety/Delirium protocol (if indicated):  VAP protocol (if indicated): yes DVT prophylaxis: sub q heparin GI prophylaxis: Pantoprazole for stress ulcer prophylaxis Glucose control: SSI Mobility: PT consult Code Status: DNR if arrests Family Communication: Danny updated bedside Disposition: remain in ICU   Labs   CBC: Recent Labs  Lab 03/28/20 1030 03/30/20 0313 04/01/20 0319 04/02/20 0613  WBC 7.1 6.4 8.2 7.8  NEUTROABS  --   --  5.5  --   HGB 7.5* 7.6* 8.3* 7.8*  HCT 25.0* 24.9* 27.0* 25.3*  MCV 100.8* 98.0 97.1 97.7  PLT 351 332 454* 516*    Basic Metabolic Panel: Recent Labs  Lab 03/28/20 0551 03/30/20 0313 03/31/20 0410 04/01/20 0319 04/02/20 0613  NA 141 139 139 138 137  K 3.7 3.6 3.8 3.1* 4.0  CL 110 107 104 99 100  CO2 24 27 29 28 29   GLUCOSE 127* 122* 128* 145* 140*  BUN 37* 35* 36* 41* 38*  CREATININE 0.43* 0.41* 0.38* 0.39* 0.38*  CALCIUM 8.0* 8.0* 8.3* 8.2* 8.0*  MG 1.8  --   --  1.6*  --   PHOS 2.3*  --   --  4.2  --    GFR: Estimated Creatinine Clearance: 62.1 mL/min (A) (by C-G formula based on SCr of 0.38 mg/dL (L)). Recent Labs  Lab 03/28/20 1030 03/30/20 0313 04/01/20 0319 04/02/20 0613  WBC 7.1 6.4 8.2 7.8    Liver Function Tests: Recent Labs  Lab 03/28/20 0551 04/01/20 0319  AST 38 35  ALT 67* 50*  ALKPHOS 274* 251*  BILITOT 0.7 0.6  PROT 5.1* 5.5*  ALBUMIN 2.0* 2.0*   No results for input(s): LIPASE, AMYLASE in the last 168 hours. No results for input(s): AMMONIA in the last 168 hours.  ABG    Component Value Date/Time   PHART 7.398 03/16/2020 2245    PCO2ART 45.6 03/16/2020 2245   PO2ART 70.3 (L) 03/16/2020 2245   HCO3 27.7 03/16/2020 2245   TCO2 24 03/12/2020 1513   ACIDBASEDEF 4.8 (H) 03/12/2020 1738   O2SAT 93.5 03/16/2020 2245     Coagulation Profile: No results for input(s): INR, PROTIME in the last 168 hours.  Cardiac Enzymes: No results for input(s): CKTOTAL, CKMB, CKMBINDEX, TROPONINI in the last 168 hours.  HbA1C: Hgb A1c MFr Bld  Date/Time Value Ref Range Status  03/11/2020 05:00 AM 5.9 (H) 4.8 - 5.6 % Final    Comment:    (NOTE) Pre diabetes:          5.7%-6.4% Diabetes:              >6.4% Glycemic control for   <7.0% adults with diabetes     CBG: Recent Labs  Lab 04/02/20 1707 04/02/20 2000 04/02/20 2352 04/03/20 0352 04/03/20 0804  GLUCAP 105* 99 91 102* 100*  Critical care time: n/a     Roselie Awkward, MD [place] [facility] Pager: [phone] Cell: [phone] If no response, call [phone]

## 2020-04-03 NOTE — Evaluation (Signed)
SLP Cancellation Note  Patient Details Name: Tracy [name] MRN: [id number] DOB: 1951/02/17   Cancelled treatment:       Reason Eval/Treat Not Completed: Fatigue/lethargy limiting ability to participate(pt recently was given pain medications therefore is currently too sleepy for PMSV trial, also note RR 20 with diaphgramatic breathing with appearance of mildy increased effort)  Pt with larger bore NG tube in place at this time, ?  How this will impact pharyngeal swallow function.  If indicated *and not needed for suctioning* could she be changed to a small bore tube prior to swallow evaluation.    Educated pt and spouse to SLP plan to follow up next date for PMSV and swallow evaluation readiness.  Initiated educated re: PMSV purpose and use with pt/spouse.  Thanks for this consult.  Kathleen Lime, MS [facility] SLP Acute Rehab Services Office [phone]   [name] 04/03/2020, 5:13 PM

## 2020-04-03 NOTE — Progress Notes (Signed)
Pt weaned on 28% ATC for 9.5 Hours.  Tolerated well with no complications.

## 2020-04-03 NOTE — Progress Notes (Signed)
[facility]  Discussed case with [facility] Approved for [facility].  Roselie Awkward, MD [place] [facility] Pager: [phone] Cell: [phone] If no response, call [phone]

## 2020-04-03 NOTE — Progress Notes (Signed)
Patient ID: Tracy [name], female   DOB: 1951-09-10, 69 y.o.   MRN: [id number]    22 Days Post-Op  Subjective: Awake on vent.  Was able to void on her own yesterday.  No new major issues.  ROS: unable on vent  Objective: Vital signs in last 24 hours: Temp:  [97.9 F (36.6 C)-98.7 F (37.1 C)] 97.9 F (36.6 C) (04/21 0800) Pulse Rate:  [81-91] 84 (04/21 0600) Resp:  [13-26] 19 (04/21 0600) BP: (89-120)/(27-61) 116/49 (04/21 0600) SpO2:  [98 %-100 %] 100 % (04/21 0822) FiO2 (%):  [28 %-30 %] 28 % (04/21 0900) Weight:  [68.9 kg] 68.9 kg (04/21 0500) Last BM Date: 04/02/20  Intake/Output from previous day: 04/20 0701 - 04/21 0700 In: 40.1 [I.V.:10; IV Piggyback:25.1] Out: 945 [Urine:800; Drains:145] Intake/Output this shift: No intake/output data recorded.  PE: Abd: soft, ostomy in place with a lot of flatus present.  No significant stool today.  TFs running at goal rate, +BS, midline wound is stable and packed.  IR jp drain present with serocloudy drainage  Lab Results:  Recent Labs    04/01/20 0319 04/02/20 0613  WBC 8.2 7.8  HGB 8.3* 7.8*  HCT 27.0* 25.3*  PLT 454* 516*   BMET Recent Labs    04/01/20 0319 04/02/20 0613  NA 138 137  K 3.1* 4.0  CL 99 100  CO2 28 29  GLUCOSE 145* 140*  BUN 41* 38*  CREATININE 0.39* 0.38*  CALCIUM 8.2* 8.0*   PT/INR No results for input(s): LABPROT, INR in the last 72 hours. CMP     Component Value Date/Time   NA 137 04/02/2020 0613   K 4.0 04/02/2020 0613   CL 100 04/02/2020 0613   CO2 29 04/02/2020 0613   GLUCOSE 140 (H) 04/02/2020 0613   BUN 38 (H) 04/02/2020 0613   CREATININE 0.38 (L) 04/02/2020 0613   CALCIUM 8.0 (L) 04/02/2020 0613   PROT 5.5 (L) 04/01/2020 0319   ALBUMIN 2.0 (L) 04/01/2020 0319   AST 35 04/01/2020 0319   ALT 50 (H) 04/01/2020 0319   ALKPHOS 251 (H) 04/01/2020 0319   BILITOT 0.6 04/01/2020 0319   GFRNONAA >60 04/02/2020 0613   GFRAA >60 04/02/2020 0613   Lipase     Component Value  Date/Time   LIPASE 18 03/03/2020 1214       Studies/Results: CT ABDOMEN PELVIS W CONTRAST  Result Date: 04/01/2020 CLINICAL DATA:  Abdominal abscess. Status post drain placement. Admitted 3/21 with bowel perforation requiring colostomy and lower anterior resection in setting of newly diagnosed proximal rectal cancer with sigmoid perforation. EXAM: CT ABDOMEN AND PELVIS WITH CONTRAST TECHNIQUE: Multidetector CT imaging of the abdomen and pelvis was performed using the standard protocol following bolus administration of intravenous contrast. 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 Small bilateral pleural effusions noted. Atelectatic changes characterize both lung bases. Hepatobiliary: Interval decrease in fluid seen anterior to the left liver on the previous study. There is a subcapsular 2.0 x 0.7 cm low-density lesion anterior segment IV today which is probably some residual fluid although underlying parenchymal lesion obscured on the prior study is not entirely excluded. There is no evidence for gallstones, gallbladder wall thickening, or pericholecystic fluid. No intrahepatic or extrahepatic biliary dilation. Pancreas: No focal mass lesion. No dilatation of the main duct. No intraparenchymal cyst. No peripancreatic edema. Spleen:  No splenomegaly. No focal mass lesion. Adrenals/Urinary Tract: No adrenal nodule or mass. Kidneys unremarkable. No evidence for hydroureter. The urinary bladder appears normal for the degree of distention. Stomach/Bowel: Stomach is decompressed by a NG tube. Duodenum is normally positioned as is the ligament of Treitz. No small bowel dilatation. Cecal tip is in the central pelvis as before. Appendix unremarkable. No colonic dilatation. Left abdominal colostomy noted. Vascular/Lymphatic: There is abdominal  aortic atherosclerosis without aneurysm. There is no gastrohepatic or hepatoduodenal ligament lymphadenopathy. No retroperitoneal or mesenteric lymphadenopathy. No pelvic sidewall lymphadenopathy. Reproductive: No gross abnormality in the uterus. There is some fluid noted in the vagina. Other: Interval decrease in the anterior left upper quadrant abscess/fluid collection, measuring 13.5 x 1.3 cm today when measured in a similar fashion to the prior study where it was recorded at 16.4 x 3.3 cm. These measurements overestimate the size of the fluid collection given the lens shaped abscess. The anterior left abdominal percutaneous pigtail catheter remains in place with the distal loop formed in the inferior aspect of this collection. Focal fluid collection adjacent to the proximal stomach has decreased in the interval measuring 2.9 x 1.2 cm today compared to 5.5 x 3.2 cm previously. Fluid collection in the left abdomen and left lower quadrant has largely resolved. Small residual component noted left paracolic gutter measuring 2.4 x 2.0 cm. The left percutaneous pigtail catheter with the tip coiled posterior to the left kidney has no substantial fluid around it. Similar appearance of the presacral/cul-de-sac collection measuring 7.1 x 3.3 cm today compared to 7.2 x 3.7 cm when remeasured in a similar fashion on the prior study. Right-sided surgical drain remains in stable position in the posterior pelvis. Musculoskeletal: Diffuse body wall edema again noted. Open midline wound again identified. No worrisome lytic or sclerotic osseous abnormality. IMPRESSION: 1. Overall generalized interval decrease in size of the multiple intra-abdominal/intrapelvic fluid collections present on the prior study. The anterior left abdominal percutaneous pigtail catheter remains in place with the distal loop formed in the inferior aspect of the dominant anterior collection. 2. Interval near complete resolution of the left abdomen and left  lower quadrant fluid collection with a small residual component in the left paracolic gutter. No residual fluid around the tip of the posterior left abdominal percutaneous catheter. 3. Similar appearance of the presacral/cul-de-sac collection. 4. 2.0 x 0.7 cm low-density subcapsular lesion anterior segment IV today which is probably some residual fluid although underlying parenchymal lesion obscured on the prior study is not entirely excluded. Attention on follow-up recommended. 5. 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 Small bilateral pleural effusions are new since prior. 6. Diffuse body wall edema. 7. Aortic Atherosclerosis (ICD10-I70.0). Electronically Signed   By: Misty Stanley M.D.   On: 04/01/2020 12:01   DG CHEST PORT 1 VIEW  Result Date: 04/03/2020 CLINICAL DATA:  Respiratory failure EXAM: PORTABLE CHEST 1 VIEW COMPARISON:  03/25/2020 FINDINGS: The tracheostomy tube, NG tube and right PICC lines are stable. Underlying emphysematous changes and upper lobe scarring. No infiltrates or effusions. No pneumothorax. IMPRESSION: Stable support apparatus. Emphysematous changes and upper lobe pulmonary scarring without definite infiltrates or effusions. Electronically Signed   By: Marijo Sanes M.D.   On: 04/03/2020 07:06    Anti-infectives: Anti-infectives (From admission, onward)   Start     Dose/Rate Route Frequency Ordered Stop   03/18/20 0900  meropenem (MERREM) 1 g in sodium chloride 0.9 % 100 mL IVPB  Status:  Discontinued     1 g 200 mL/hr over 30 Minutes Intravenous Every 8 hours 03/18/20 0838 03/26/20 1106   03/15/20 2200  meropenem (MERREM) 1 g in sodium chloride 0.9 % 100 mL IVPB  Status:  Discontinued     1 g 200 mL/hr over 30 Minutes Intravenous Every 12 hours 03/15/20 1021 03/18/20 0838   03/14/20 1000  anidulafungin (ERAXIS) 100 mg in sodium chloride 0.9 % 100 mL IVPB  Status:  Discontinued     100 mg 78 mL/hr over 100  Minutes Intravenous Every 24 hours 03/13/20 0752 03/18/20 0849   03/13/20 0900  anidulafungin (ERAXIS) 200 mg in sodium chloride 0.9 % 200 mL IVPB     200 mg 78 mL/hr over 200 Minutes Intravenous  Once 03/13/20 0752 03/13/20 1241   03/11/20 2000  meropenem (MERREM) 500 mg in sodium chloride 0.9 % 100 mL IVPB  Status:  Discontinued     500 mg 200 mL/hr over 30 Minutes Intravenous Every 12 hours 03/11/20 1658 03/15/20 1021   03/11/20 1200  piperacillin-tazobactam (ZOSYN) IVPB 2.25 g  Status:  Discontinued     2.25 g 100 mL/hr over 30 Minutes Intravenous Every 6 hours 03/11/20 1058 03/11/20 1641   03/06/20 1200  piperacillin-tazobactam (ZOSYN) IVPB 3.375 g  Status:  Discontinued     3.375 g 12.5 mL/hr over 240 Minutes Intravenous Every 8 hours 03/06/20 0739 03/11/20 1058   03/05/20 1400  piperacillin-tazobactam (ZOSYN) IVPB 2.25 g  Status:  Discontinued     2.25 g 100 mL/hr over 30 Minutes Intravenous Every 8 hours 03/05/20 0752 03/06/20 0739   03/04/20 2200  piperacillin-tazobactam (ZOSYN) IVPB 3.375 g  Status:  Discontinued     3.375 g 12.5 mL/hr over 240 Minutes Intravenous Every 8 hours 03/03/20 2032 03/05/20 0752   03/03/20 1545  cefoTEtan (CEFOTAN) 2 g in sodium chloride 0.9 % 100 mL IVPB     2 g 200 mL/hr over 30 Minutes Intravenous On call to O.R. 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 AKI- Cr normalized ABL anemia -stable Malnutrition - TFat goal, TPN stopped 4/13 Urinary retention - will start urecholine to help with voiding.  If she is unable to void soon, she will require I&O cath  Perforated sigmoid colon Proximal rectal cancer with  obstruction POD 32, S/p emergent Low anterior resection/ colostomy by Dr. Marcello Moores 03/03/20  POD 22,S/p exploratory laparotomy and colon resection with transverse colostomy 3/31 Dr. Hassell Done -wet to drydressing changesto midline abdominal wound, will add Dakin's for 3 days to eradicate pseudomonal colonization. - WOC teamfollowing for ostomy teaching - s/p IR drain x2 4/7,culturePSEUDOMONAS AERUGINOSA, completed course of meropenem -repeat CT shows stable to improvement in fluid collections. -surgical drain and drain #2 from IR removed on 4/20  FEN -NPO, TF at55cc/hr VTE - SCDs,SQ heparin ID - Zosyn3/21>3/29; Eraxis 3/31>>4/5,Merrem 3/29>>4/13 Foley -wick Dispo - TOC looking into LTACH, but insurance denied.  Called for peer to peer but insurance never returned our call.   LOS: 31 days    Henreitta Cea , [facility] Surgery 04/03/2020, 9:57 AM Please see Amion for pager number during day hours 7:00am-4:30pm or 7:00am -11:30am on  weekends

## 2020-04-03 NOTE — Progress Notes (Signed)
SLP  Note  Patient Details Name: Tracy [name] MRN: [id number] DOB: [birth year]   Cancelled treatment:       Reason Eval/Treat Not Completed: Other (comment)(order for swallow evaluation received, note pt on TC during the day for several hours.  ? would pt be a good candidate for PMSV to faciliate communication and expedite weaning?  Will continue efforts.  Thanks.)   Kathleen Lime, MS [facility] SLP Acute Rehab Services Office [phone]  [name] 04/03/2020, 3:09 PM

## 2020-04-03 NOTE — Progress Notes (Signed)
Patient up to chair this afternoon from 1130-1830. Patient tolerated well with no complications. O2 maintained greater than 96% through day.

## 2020-04-04 ENCOUNTER — Inpatient Hospital Stay (HOSPITAL_COMMUNITY): Payer: Medicare HMO

## 2020-04-04 DIAGNOSIS — J449 Chronic obstructive pulmonary disease, unspecified: Secondary | ICD-10-CM

## 2020-04-04 DIAGNOSIS — R609 Edema, unspecified: Secondary | ICD-10-CM

## 2020-04-04 DIAGNOSIS — Z930 Tracheostomy status: Secondary | ICD-10-CM

## 2020-04-04 LAB — GLUCOSE, CAPILLARY
Glucose-Capillary: 110 mg/dL — ABNORMAL HIGH (ref 70–99)
Glucose-Capillary: 110 mg/dL — ABNORMAL HIGH (ref 70–99)
Glucose-Capillary: 113 mg/dL — ABNORMAL HIGH (ref 70–99)
Glucose-Capillary: 118 mg/dL — ABNORMAL HIGH (ref 70–99)
Glucose-Capillary: 138 mg/dL — ABNORMAL HIGH (ref 70–99)
Glucose-Capillary: 99 mg/dL (ref 70–99)

## 2020-04-04 LAB — COMPREHENSIVE METABOLIC PANEL
ALT: 39 U/L (ref 0–44)
AST: 29 U/L (ref 15–41)
Albumin: 2 g/dL — ABNORMAL LOW (ref 3.5–5.0)
Alkaline Phosphatase: 186 U/L — ABNORMAL HIGH (ref 38–126)
Anion gap: 8 (ref 5–15)
BUN: 28 mg/dL — ABNORMAL HIGH (ref 8–23)
CO2: 29 mmol/L (ref 22–32)
Calcium: 8.4 mg/dL — ABNORMAL LOW (ref 8.9–10.3)
Chloride: 101 mmol/L (ref 98–111)
Creatinine, Ser: 0.32 mg/dL — ABNORMAL LOW (ref 0.44–1.00)
GFR calc Af Amer: >60 mL/min (ref >60)
GFR calc non Af Amer: >60 mL/min (ref >60)
Glucose, Bld: 125 mg/dL — ABNORMAL HIGH (ref 70–99)
Potassium: 3.7 mmol/L (ref 3.5–5.1)
Sodium: 138 mmol/L (ref 135–145)
Total Bilirubin: 0.4 mg/dL (ref 0.3–1.2)
Total Protein: 5.2 g/dL — ABNORMAL LOW (ref 6.5–8.1)

## 2020-04-04 LAB — PHOSPHORUS: Phosphorus: 3.9 mg/dL (ref 2.5–4.6)

## 2020-04-04 LAB — MAGNESIUM: Magnesium: 1.8 mg/dL (ref 1.7–2.4)

## 2020-04-04 MED ORDER — DOCUSATE SODIUM 50 MG/5ML PO LIQD
100.0000 mg | Freq: Two times a day (BID) | ORAL | Status: DC
  Administered 2020-04-04 – 2020-04-05 (×3): 100 mg
  Filled 2020-04-04 (×3): qty 10

## 2020-04-04 NOTE — Progress Notes (Signed)
Pt placed on 28% TC. Family at bedside.

## 2020-04-04 NOTE — Evaluation (Signed)
Passy-Muir Speaking Valve - Evaluation Patient Details  Name: Tracy [name] MRN: [id number] Date of Birth: 08-30-1951  Today's Date: 04/04/2020 Time: 1220-1241 SLP Time Calculation (min) (ACUTE ONLY): 21 min  Past Medical History:  Past Medical History:  Diagnosis Date  . Asthma   . COPD (chronic obstructive pulmonary disease) ([place]) 2011   FeV1 31% predicted FeV1/FVX 47 %  . Hemorrhoid   . Osteopenia   . Seasonal allergies    takes Claritin daily prn  . Vitamin D deficiency    takes Vit d every 14 days   Past Surgical History:  Past Surgical History:  Procedure Laterality Date  . AUGMENTATION MAMMAPLASTY     saline  . BREAST ENHANCEMENT SURGERY    . EXAMINATION UNDER ANESTHESIA  10/14/2012   Procedure: EXAM UNDER ANESTHESIA;  Surgeon: Gayland Curry, MD,FACS;  Location: [place];  Service: General;  Laterality: N/A;  rectal exam under anesthesia excisional hemorrhoidectomy hemorrhoidal banding x two  . EXAMINATION UNDER ANESTHESIA  02/03/2013   excision hemorrhoidal tissue  . FOOT SURGERY     left bunionectomy  . HEMORRHOIDECTOMY WITH HEMORRHOID BANDING  10/14/2012   Procedure: [id number] WITH HEMORRHOID BANDING;  Surgeon: Gayland Curry, MD,FACS;  Location: [place];  Service: General;  Laterality: N/A;  . LAPAROTOMY N/A 03/03/2020   Procedure: low anterior resection end colostomy;  Surgeon: [name], MD;  Location: [facility];  Service: General;  Laterality: N/A;  . LAPAROTOMY N/A 03/12/2020   Procedure: EXPLORATORY LAPAROTOMY WITH BOWEL RESECTION AND COLOSTOMY;  Surgeon: [name], MD;  Location: [facility];  Service: General;  Laterality: N/A;  . OPEN REDUCTION INTERNAL FIXATION (ORIF) DISTAL RADIAL FRACTURE Right 11/01/2018   Procedure: OPEN REDUCTION INTERNAL FIXATION (ORIF) DISTAL RADIAL FRACTURE;  Surgeon: [name], MD;  Location: [place];  Service: Orthopedics;  Laterality: Right;  . TONSILLECTOMY  age 51    recurrent otitis media   HPI:  69  y/o female admitted 3/21 with bowel perforation requiring colostomy and lower anterior resection in setting of newly diagnosed proximal rectal cancer with sigmoid perforation.  Post op developed septic shock, HCAP AKI ileus and acute hypoxemic respiratory failure requiring intubation. Pt was intubated from 3/27-4/7 with tracheostomy on 4/7.     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  Pt was encountered awake with son present at bedside.  Pt has a Shiley #6 cuffed trach and cuff was inflated upon SLP arrival.  SLP provided educated to pt and son regarding evaluation and Passy Muir Speaking Valve (PMV).  Cuff was deflated and pt tolerated without significant difficulty or change in vitals.  Pt had some difficulty following commands for timing of inspiration and phonation during expiration with finger occlusion trials, but she was able to achieve phonation.  HR was at 88, SpO2 was 99, and RR was 29 prior to PMV trials.  SLP placed PMV and pt tolerated for approximately 4-5 minutes before removal.  Pt was able to achieve phonation and she was 100% intelligible at the word level.  HR ranged from 88-89, SpO2 initially dropped to 95 and then rose to 99, and RR ranged from 29-31.  Pt appeared to be mildly uncomfortable and she was noted to use her accessory muscles during respiration.  Mild back pressure was observed when PMV was removed.  Following a 3-5 minute break, PMV was donned again and pt tolerated for  4 minutes without significant change in vitals.   Towards the end of the second trial, expiratory wheezing was noted and PMV was subsequently removed.  Recommend that pt only wear PMV with Speech Therapy supervision at this time.  PMV was left in the pt's room and pt, son, and RN were made aware of recommendations.  Signs were hung above the pt's bed and a sticker was placed on trach with warning to  deflate cuff prior to PMV placement. Cuff was left deflated and RN was made aware.    SLP Visit Diagnosis: Aphonia (R49.1)    SLP Assessment  Patient needs continued Speech Lanaguage Pathology Services    Follow Up Recommendations  Inpatient Rehab;LTACH    Frequency and Duration min 2x/week  2 weeks    PMSV Trial PMSV was placed for: 3-5 minutes at a time  Able to redirect subglottic air through upper airway: Yes Able to Attain Phonation: Yes Voice Quality: Normal Able to Expectorate Secretions: No attempts Level of Secretion Expectoration with PMSV: Not observed Breath Support for Phonation: Moderately decreased Intelligibility: Intelligible Behavior: Alert;Anxious   Tracheostomy Tube       Vent Dependency  FiO2 (%): 28 %    Cuff Deflation Trial  GO Tolerated Cuff Deflation: Yes Length of Time for Cuff Deflation Trial: 30+ minutes  Behavior: Anxious;Alert Cuff Deflation Trial - Comments: cough x1 when cuff was first deflated         [place] 04/04/2020, 1:56 PM

## 2020-04-04 NOTE — Progress Notes (Signed)
Patient ID: Tracy [name], female   DOB: 1951-01-17, 69 y.o.   MRN: [id number]    23 Days Post-Op  Subjective: Patient states she feels ok today.  Having a little abdominal pain.  Voiding well.  Weaning during the day but requires replacement on vent at night.    ROS: unable on vent  Objective: Vital signs in last 24 hours: Temp:  [97.5 F (36.4 C)-98.1 F (36.7 C)] 98 F (36.7 C) (04/22 0800) Pulse Rate:  [73-107] 87 (04/22 0800) Resp:  [14-28] 25 (04/22 0800) BP: (105-142)/(35-69) 113/51 (04/22 0800) SpO2:  [97 %-100 %] 100 % (04/22 0800) FiO2 (%):  [28 %-30 %] 30 % (04/22 0837) Weight:  [70 kg] 70 kg (04/22 0500) Last BM Date: 04/02/20  Intake/Output from previous day: 04/21 0701 - 04/22 0700 In: 3282.7 [NG/GT:3282.7] Out: 1165 [Urine:1050; Drains:85; Stool:30] Intake/Output this shift: No intake/output data recorded.  PE: Neck: trach in place on vent currently Abd: soft, stable midline wound with some fibrin present but with some granular tissue present as well.  Left sided colostomy evaluated.  This was digitized due to decrease in stool in the last several days, but still with a lot of flatus.  There is not stricturing noted.  Not stool palpated either.  Stoma is pink and viable.  JP drain in place still with sero-cloudy drainage present. 85cc noted yesterday Ext: RUE with significantly more edema than LUE.  This arm has her PICC in place  Lab Results:  Recent Labs    04/02/20 0613  WBC 7.8  HGB 7.8*  HCT 25.3*  PLT 516*   BMET Recent Labs    04/02/20 0613 04/04/20 0418  NA 137 138  K 4.0 3.7  CL 100 101  CO2 29 29  GLUCOSE 140* 125*  BUN 38* 28*  CREATININE 0.38* 0.32*  CALCIUM 8.0* 8.4*   PT/INR No results for input(s): LABPROT, INR in the last 72 hours. CMP     Component Value Date/Time   NA 138 04/04/2020 0418   K 3.7 04/04/2020 0418   CL 101 04/04/2020 0418   CO2 29 04/04/2020 0418   GLUCOSE 125 (H) 04/04/2020 0418   BUN 28 (H)  04/04/2020 0418   CREATININE 0.32 (L) 04/04/2020 0418   CALCIUM 8.4 (L) 04/04/2020 0418   PROT 5.2 (L) 04/04/2020 0418   ALBUMIN 2.0 (L) 04/04/2020 0418   AST 29 04/04/2020 0418   ALT 39 04/04/2020 0418   ALKPHOS 186 (H) 04/04/2020 0418   BILITOT 0.4 04/04/2020 0418   GFRNONAA >60 04/04/2020 0418   GFRAA >60 04/04/2020 0418   Lipase     Component Value Date/Time   LIPASE 18 03/03/2020 1214       Studies/Results: DG CHEST PORT 1 VIEW  Result Date: 04/03/2020 CLINICAL DATA:  Respiratory failure EXAM: PORTABLE CHEST 1 VIEW COMPARISON:  03/25/2020 FINDINGS: The tracheostomy tube, NG tube and right PICC lines are stable. Underlying emphysematous changes and upper lobe scarring. No infiltrates or effusions. No pneumothorax. IMPRESSION: Stable support apparatus. Emphysematous changes and upper lobe pulmonary scarring without definite infiltrates or effusions. Electronically Signed   By: Marijo Sanes M.D.   On: 04/03/2020 07:06    Anti-infectives: Anti-infectives (From admission, onward)   Start     Dose/Rate Route Frequency Ordered Stop   03/18/20 0900  meropenem (MERREM) 1 g in sodium chloride 0.9 % 100 mL IVPB  Status:  Discontinued     1 g 200 mL/hr over 30 Minutes Intravenous Every  8 hours 03/18/20 0838 03/26/20 1106   03/15/20 2200  meropenem (MERREM) 1 g in sodium chloride 0.9 % 100 mL IVPB  Status:  Discontinued     1 g 200 mL/hr over 30 Minutes Intravenous Every 12 hours 03/15/20 1021 03/18/20 0838   03/14/20 1000  anidulafungin (ERAXIS) 100 mg in sodium chloride 0.9 % 100 mL IVPB  Status:  Discontinued     100 mg 78 mL/hr over 100 Minutes Intravenous Every 24 hours 03/13/20 0752 03/18/20 0849   03/13/20 0900  anidulafungin (ERAXIS) 200 mg in sodium chloride 0.9 % 200 mL IVPB     200 mg 78 mL/hr over 200 Minutes Intravenous  Once 03/13/20 0752 03/13/20 1241   03/11/20 2000  meropenem (MERREM) 500 mg in sodium chloride 0.9 % 100 mL IVPB  Status:  Discontinued     500  mg 200 mL/hr over 30 Minutes Intravenous Every 12 hours 03/11/20 1658 03/15/20 1021   03/11/20 1200  piperacillin-tazobactam (ZOSYN) IVPB 2.25 g  Status:  Discontinued     2.25 g 100 mL/hr over 30 Minutes Intravenous Every 6 hours 03/11/20 1058 03/11/20 1641   03/06/20 1200  piperacillin-tazobactam (ZOSYN) IVPB 3.375 g  Status:  Discontinued     3.375 g 12.5 mL/hr over 240 Minutes Intravenous Every 8 hours 03/06/20 0739 03/11/20 1058   03/05/20 1400  piperacillin-tazobactam (ZOSYN) IVPB 2.25 g  Status:  Discontinued     2.25 g 100 mL/hr over 30 Minutes Intravenous Every 8 hours 03/05/20 0752 03/06/20 0739   03/04/20 2200  piperacillin-tazobactam (ZOSYN) IVPB 3.375 g  Status:  Discontinued     3.375 g 12.5 mL/hr over 240 Minutes Intravenous Every 8 hours 03/03/20 2032 03/05/20 0752   03/03/20 1545  cefoTEtan (CEFOTAN) 2 g in sodium chloride 0.9 % 100 mL IVPB     2 g 200 mL/hr over 30 Minutes Intravenous On call to O.R. 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 AKI- Cr normalized ABL anemia -stable Malnutrition - TFat goal, TPN stopped 4/13 Urinary retention - will start urecholine to help with voiding. If she is unable to void soon, she will require I&O cath RUE edema - duplex pending  Perforated sigmoid colon Proximal rectal cancer with obstruction POD 33,S/p emergent Low anterior resection/ colostomy by Dr. Marcello Moores 03/03/20 POD 23,S/p exploratory laparotomy and colon resection with transverse colostomy 3/31 Dr. Hassell Done -wet to drydressing changesto midline abdominal wound, will add Dakin's for 3 days to eradicate pseudomonal colonization.  This  has been completed.  Return to NS WD dressing changes - WOC teamfollowing for ostomy teaching.  Stoma seen today.  Some mucocutaneous separation noted, but nothing to do currently.  Stoma still working well and is viable. - s/p IR drain x2 4/7,culturePSEUDOMONAS AERUGINOSA, completed course of meropenem -repeat CT shows stable to improvement in fluid collections. -surgical drain and drain #2 from IR removed on 4/20, drain #1 remains in place -given decrease in stool output from ostomy over the last 2-3 days and not stricture on exam, will give colace BID to help move bowels better.  Moving lots of flatus well.  FEN -NPO, TF at55cc/hr VTE - SCDs,SQ heparin ID - Zosyn3/21>3/29;  Eraxis 3/31>>4/5,Merrem 3/29>>4/13 Foley -wick Dispo - insurance approved LTAC.  Husband going today to look at places.   LOS: 32 days    Henreitta Cea , [facility] Surgery 04/04/2020, 8:57 AM Please see Amion for pager number during day hours 7:00am-4:30pm or 7:00am -11:30am on weekends

## 2020-04-04 NOTE — Progress Notes (Signed)
Referring Physician(s): Barry Dienes, F.2  Supervising Physician: Arne Cleveland  Patient Status:  [name] - In-pt  Chief Complaint:  Intra-abdominal fluid collection  Subjective: 69 year old female,inpatient. History ofrectal cancer admitted for bowel perforation s/p ex lap on [id number] complicated by septic shock. IR placed 2 drains to left abdomen on 4.7.21.Patient laying in bed,sleeping no distress noted.  Allergies: Codeine  Medications: Prior to Admission medications   Medication Sig Start Date End Date Taking? Authorizing Provider  acetaminophen (TYLENOL) 325 MG tablet Take 650 mg by mouth every 6 (six) hours as needed for moderate pain.   Yes [provider]  albuterol (PROVENTIL) (2.5 MG/3ML) 0.083% nebulizer solution INHALE 3ML VIA NEBULIZER EVERY 4 HOURS AS NEEDED FOR SHORTNESS OF BREATH 11/23/19  Yes Icard, Bradley L, DO  b complex vitamins tablet Take 1 tablet by mouth daily.   Yes [provider]  Fluticasone-Umeclidin-Vilant (TRELEGY ELLIPTA) 100-62.5-25 MCG/INH AEPB Inhale 1 puff into the lungs daily. 02/15/20  Yes Icard, Bradley L, DO  VITAMIN D, CHOLECALCIFEROL, PO Take 1 tablet by mouth daily.   Yes [provider]     Vital Signs: BP (!) 107/45   Pulse 83   Temp (!) 97.1 F (36.2 C) (Axillary)   Resp 17   Ht 5\' 2"  (1.575 m)   Wt 154 lb 5.2 oz (70 kg)   LMP 12/14/2000   SpO2 100%   BMI 28.23 kg/m   Physical Exam Patient sleepy but arousable during exam. Tracheostomy/trach collar in place. Left JP drain intact, site is clean and dry and without erythema. Approximately 30 cc of serosanguineous fluid in JP bulb. Dressing is clean, dry and intact. Abdomen is tender.   Imaging: CT ABDOMEN PELVIS W CONTRAST  Result Date: 04/01/2020 CLINICAL DATA:  Abdominal abscess. Status post drain placement. Admitted 3/21 with bowel perforation requiring colostomy and lower anterior resection in setting of newly diagnosed proximal rectal cancer  with sigmoid perforation. EXAM: CT ABDOMEN AND PELVIS WITH CONTRAST TECHNIQUE: Multidetector CT imaging of the abdomen and pelvis was performed using the standard protocol following bolus administration of intravenous contrast. CONTRAST:  162mL OMNIPAQUE IOHEXOL 300 MG/ML  SOLN COMPARISON:  03/19/2020 FINDINGS: Lower chest: 17 mm nodular density medial right posterior lung base is new since prior, likely collapse/consolidative disease. Small bilateral pleural effusions noted. Atelectatic changes characterize both lung bases. Hepatobiliary: Interval decrease in fluid seen anterior to the left liver on the previous study. There is a subcapsular 2.0 x 0.7 cm low-density lesion anterior segment IV today which is probably some residual fluid although underlying parenchymal lesion obscured on the prior study is not entirely excluded. There is no evidence for gallstones, gallbladder wall thickening, or pericholecystic fluid. No intrahepatic or extrahepatic biliary dilation. Pancreas: No focal mass lesion. No dilatation of the main duct. No intraparenchymal cyst. No peripancreatic edema. Spleen: No splenomegaly. No focal mass lesion. Adrenals/Urinary Tract: No adrenal nodule or mass. Kidneys unremarkable. No evidence for hydroureter. The urinary bladder appears normal for the degree of distention. Stomach/Bowel: Stomach is decompressed by a NG tube. Duodenum is normally positioned as is the ligament of Treitz. No small bowel dilatation. Cecal tip is in the central pelvis as before. Appendix unremarkable. No colonic dilatation. Left abdominal colostomy noted. Vascular/Lymphatic: There is abdominal aortic atherosclerosis without aneurysm. There is no gastrohepatic or hepatoduodenal ligament lymphadenopathy. No retroperitoneal or mesenteric lymphadenopathy. No pelvic sidewall lymphadenopathy. Reproductive: No gross abnormality in the uterus. There is some fluid noted in the vagina. Other: Interval decrease in the  anterior left  upper quadrant abscess/fluid collection, measuring 13.5 x 1.3 cm today when measured in a similar fashion to the prior study where it was recorded at 16.4 x 3.3 cm. These measurements overestimate the size of the fluid collection given the lens shaped abscess. The anterior left abdominal percutaneous pigtail catheter remains in place with the distal loop formed in the inferior aspect of this collection. Focal fluid collection adjacent to the proximal stomach has decreased in the interval measuring 2.9 x 1.2 cm today compared to 5.5 x 3.2 cm previously. Fluid collection in the left abdomen and left lower quadrant has largely resolved. Small residual component noted left paracolic gutter measuring 2.4 x 2.0 cm. The left percutaneous pigtail catheter with the tip coiled posterior to the left kidney has no substantial fluid around it. Similar appearance of the presacral/cul-de-sac collection measuring 7.1 x 3.3 cm today compared to 7.2 x 3.7 cm when remeasured in a similar fashion on the prior study. Right-sided surgical drain remains in stable position in the posterior pelvis. Musculoskeletal: Diffuse body wall edema again noted. Open midline wound again identified. No worrisome lytic or sclerotic osseous abnormality. IMPRESSION: 1. Overall generalized interval decrease in size of the multiple intra-abdominal/intrapelvic fluid collections present on the prior study. The anterior left abdominal percutaneous pigtail catheter remains in place with the distal loop formed in the inferior aspect of the dominant anterior collection. 2. Interval near complete resolution of the left abdomen and left lower quadrant fluid collection with a small residual component in the left paracolic gutter. No residual fluid around the tip of the posterior left abdominal percutaneous catheter. 3. Similar appearance of the presacral/cul-de-sac collection. 4. 2.0 x 0.7 cm low-density subcapsular lesion anterior segment IV today which is probably  some residual fluid although underlying parenchymal lesion obscured on the prior study is not entirely excluded. Attention on follow-up recommended. 5. 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 Small bilateral pleural effusions are new since prior. 6. Diffuse body wall edema. 7. Aortic Atherosclerosis (ICD10-I70.0). Electronically Signed   By: Misty Stanley M.D.   On: 04/01/2020 12:01   DG CHEST PORT 1 VIEW  Result Date: 04/03/2020 CLINICAL DATA:  Respiratory failure EXAM: PORTABLE CHEST 1 VIEW COMPARISON:  03/25/2020 FINDINGS: The tracheostomy tube, NG tube and right PICC lines are stable. Underlying emphysematous changes and upper lobe scarring. No infiltrates or effusions. No pneumothorax. IMPRESSION: Stable support apparatus. Emphysematous changes and upper lobe pulmonary scarring without definite infiltrates or effusions. Electronically Signed   By: Marijo Sanes M.D.   On: 04/03/2020 07:06    Labs:  CBC: Recent Labs    03/28/20 1030 03/30/20 0313 04/01/20 0319 04/02/20 0613  WBC 7.1 6.4 8.2 7.8  HGB 7.5* 7.6* 8.3* 7.8*  HCT 25.0* 24.9* 27.0* 25.3*  PLT 351 332 454* 516*    COAGS: Recent Labs    03/09/20 1752  INR 1.3*    BMP: Recent Labs    03/31/20 0410 04/01/20 0319 04/02/20 0613 04/04/20 0418  NA 139 138 137 138  K 3.8 3.1* 4.0 3.7  CL 104 99 100 101  CO2 29 28 29 29   GLUCOSE 128* 145* 140* 125*  BUN 36* 41* 38* 28*  CALCIUM 8.3* 8.2* 8.0* 8.4*  CREATININE 0.38* 0.39* 0.38* 0.32*  GFRNONAA >60 >60 >60 >60  GFRAA >60 >60 >60 >60    LIVER FUNCTION TESTS: Recent Labs    03/25/20 0520 03/28/20 0551 04/01/20 0319 04/04/20 [id number]  BILITOT 0.3 0.7 0.6 0.4  AST 60* 38 35 29  ALT 62* 67* 50* 39  ALKPHOS 182* 274* 251* 186*  PROT 5.2* 5.1* 5.5* 5.2*  ALBUMIN 2.2* 2.0* 2.0* 2.0*    Assessment and Plan:  69y.o, femaleinpatient. History ofrectal cancer admitted for bowel perforation s/p ex lap  on 123XX123 complication by septic shock. IR placed 2 drains to left abdomen on 4.7.21. Drain #2 was removed 04/02/2020 at the bedside. Drain #1 is intact with output of approximately 145 ml in the last 12 hours. Will continue to monitor output until minimal output noted and drain can be removed. Continue flushing TID, output recording every shift and dressing changes as needed.   Electronically Signed: Theresa Duty, NP 04/04/2020, 2:24 PM   I spent a total of 15 min. at the the patient's bedside AND on the patient's hospital floor or unit, greater than 50% of which was counseling/coordinating care for left abdominal drain.

## 2020-04-04 NOTE — TOC Progression Note (Signed)
Transition of Care [facility]) - Progression Note    Patient Details  Name: GARLA GASSERT MRN: [id number] Date of Birth: 25-Apr-1951  Transition of Care [facility] [place]) CM/SW Contact  Leeroy Cha, RN Phone Number: 04/04/2020, 3:33 PM  Clinical Narrative:    Spoke with husband and patient.  Family and couple woul;d [facility]   Expected Discharge Plan: [place] Barriers to Discharge: Continued Medical Work up  Expected Discharge Plan and Services Expected Discharge Plan: [place] In-house Referral: Clinical Social Work     Living arrangements for the past 2 months: Single Family Home                                       Social Determinants of Health (SDOH) Interventions    Readmission Risk Interventions Readmission Risk Prevention Plan 03/25/2020 03/04/2020  Medication Screening - Complete  Transportation Screening Complete Complete  Medication Review (RN Care Manager) Complete -  Some recent data might be hidden

## 2020-04-04 NOTE — Consult Note (Signed)
[place] communication with bedside ICU nurse. Husband to return later, she will monitor husband changing pouch when he returns.    Supplies in the room.   [place] Nurse will follow along with you for continued support with ostomy teaching and care [place] MSN, RN, [place], [place], [place]

## 2020-04-04 NOTE — Evaluation (Signed)
Clinical/Bedside Swallow Evaluation Patient Details  Name: [name] MRN: [id number] Date of Birth: [birth year]  Today's Date: 04/04/2020 Time: SLP Start Time (ACUTE ONLY): 1242 SLP Stop Time (ACUTE ONLY): 1254 SLP Time Calculation (min) (ACUTE ONLY): 12 min  Past Medical History:  Past Medical History:  Diagnosis Date  . Asthma   . COPD (chronic obstructive pulmonary disease) ([place]) 2011   FeV1 31% predicted FeV1/FVX 47 %  . Hemorrhoid   . Osteopenia   . Seasonal allergies    takes Claritin daily prn  . Vitamin D deficiency    takes Vit d every 14 days   Past Surgical History:  Past Surgical History:  Procedure Laterality Date  . AUGMENTATION MAMMAPLASTY     saline  . BREAST ENHANCEMENT SURGERY    . EXAMINATION UNDER ANESTHESIA  10/14/2012   Procedure: EXAM UNDER ANESTHESIA;  Surgeon: Tracy Curry, MD,FACS;  Location: [place];  Service: General;  Laterality: N/A;  rectal exam under anesthesia excisional hemorrhoidectomy hemorrhoidal banding x two  . EXAMINATION UNDER ANESTHESIA  02/03/2013   excision hemorrhoidal tissue  . FOOT SURGERY     left bunionectomy  . HEMORRHOIDECTOMY WITH HEMORRHOID BANDING  10/14/2012   Procedure: [id number] WITH HEMORRHOID BANDING;  Surgeon: Tracy Curry, MD,FACS;  Location: [place];  Service: General;  Laterality: N/A;  . LAPAROTOMY N/A 03/03/2020   Procedure: low anterior resection end colostomy;  Surgeon: Tracy Ruff, MD;  Location: [facility];  Service: General;  Laterality: N/A;  . LAPAROTOMY N/A 03/12/2020   Procedure: EXPLORATORY LAPAROTOMY WITH BOWEL RESECTION AND COLOSTOMY;  Surgeon: Tracy Hausen, MD;  Location: [facility];  Service: General;  Laterality: N/A;  . OPEN REDUCTION INTERNAL FIXATION (ORIF) DISTAL RADIAL FRACTURE Right 11/01/2018   Procedure: OPEN REDUCTION INTERNAL FIXATION (ORIF) DISTAL RADIAL FRACTURE;  Surgeon: Tracy Cover, MD;  Location: [place];  Service: Orthopedics;  Laterality: Right;  .  TONSILLECTOMY  age 59    recurrent otitis media   HPI:  69 y/o female admitted 3/21 with bowel perforation requiring colostomy and lower anterior resection in setting of newly diagnosed proximal rectal cancer with sigmoid perforation.  Post op developed septic shock, HCAP AKI ileus and acute hypoxemic respiratory failure requiring intubation. Pt was intubated from 3/27-4/7 with tracheostomy on 4/7.     Assessment / Plan / Recommendation Clinical Impression  Pt was seen for a bedside swallow evaluation and she presents with suspected oropharyngeal dysphagia.  Oral mechanism exam was remarkable for generalized oral weakness and reduced lingual strength/ROM.  Pt currently has a large bore NG tube in place.  Pt consumed small ice chips x2 with PMV donned.  Pt exhibited good bolus acceptance and she achieved labial closure around the spoon.  Suspect prolonged AP transport and delayed swallow initiation.  An immediate cough was observed following 2/2 ice chip trials.  Due to s/sx of aspiration with PO trials and increased risk for silent aspiration secondary to tracheostomy, recommend an instrumental swallow study (with PMV donned if possible) to further evaluate swallow function when appropriate.  Additionally recommend that pt remain NPO until instrumental swallow study can be completed.  SLP will f/u per POC.   SLP Visit Diagnosis: Dysphagia, unspecified (R13.10)    Aspiration Risk  Moderate aspiration risk    Diet Recommendation NPO;Alternative means - temporary   Medication Administration: Via alternative means    Other  Recommendations Oral Care Recommendations: Oral care QID;Staff/trained caregiver to provide oral care   Follow up Recommendations Inpatient  Rehab;LTACH      Frequency and Duration min 2x/week  2 weeks       Prognosis Prognosis for Safe Diet Advancement: Good Barriers to Reach Goals: Time post onset      Swallow Study   General HPI: 69 y/o female admitted 3/21 with  bowel perforation requiring colostomy and lower anterior resection in setting of newly diagnosed proximal rectal cancer with sigmoid perforation.  Post op developed septic shock, HCAP AKI ileus and acute hypoxemic respiratory failure requiring intubation. Pt was intubated from 3/27-4/7 with tracheostomy on 4/7.   Type of Study: Bedside Swallow Evaluation Previous Swallow Assessment: none  Diet Prior to this Study: NPO Temperature Spikes Noted: No Respiratory Status: Trach Collar History of Recent Intubation: Yes Length of Intubations (days): 11 days Date extubated: 03/20/20 Behavior/Cognition: Alert;Cooperative Oral Cavity Assessment: Within Functional Limits Oral Care Completed by SLP: No Oral Cavity - Dentition: Adequate natural dentition;Other (Comment)(Partials on top ) Self-Feeding Abilities: Needs assist Patient Positioning: Upright in bed Baseline Vocal Quality: Normal(with PMV donned ) Volitional Cough: Weak Volitional Swallow: Able to elicit    Oral/Motor/Sensory Function Overall Oral Motor/Sensory Function: Mild impairment Facial ROM: Within Functional Limits Facial Symmetry: Within Functional Limits Lingual ROM: Reduced right;Reduced left Lingual Symmetry: Within Functional Limits Lingual Strength: Reduced   Ice Chips Ice chips: Impaired Presentation: Spoon Oral Phase Functional Implications: Prolonged oral transit Pharyngeal Phase Impairments: Cough - Immediate   Thin Liquid Thin Liquid: Not tested    Nectar Thick Nectar Thick Liquid: Not tested   Honey Thick Honey Thick Liquid: Not tested   Puree Puree: Not tested   Solid     Solid: Not tested     [name] M.S., CCC-SLP Acute Rehabilitation Services Office: [phone]  Tracy [name] Tracy [name] 04/04/2020,2:14 PM

## 2020-04-04 NOTE — Progress Notes (Signed)
NAME:  Tracy [name], MRN:  [id number], DOB:  26-Aug-1951, LOS: 40 ADMISSION DATE:  03/03/2020, CONSULTATION DATE:  3/27 REFERRING MD:  Ninfa Linden, CHIEF COMPLAINT:  Abdominal pain   Brief History   69 y/o female admitted 3/21 with bowel perforation requiring colostomy and lower anterior resection in setting of newly diagnosed proximal rectal cancer with sigmoid perforation.  Post op developed septic shock, HCAP AKI ileus and acute hypoxemic respiratory failure requiring intubation.   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 Valeta Harms. Not on O2.  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, metastatic carcinoma in 3/15 lymph nodes  LE [name] 3/27 >> negative for DVT  ECHO 3/27 >> poor windows, normal LV function, severely dilated RV with moderately reduced function, RV volume and pressure overload, mildly dilated RA  V/Q Scan 3/30 >> low probability for PE  4/6 CT chest abdomen pelvis> 5.5x3.2 fluid collection along stomach, surgical drain again noted in pelvis, interval development of crescent shaped fluid  collection measuring 16.4 x 3.3 in epigastric region and left upper quadrant of abdomen (could be abscess) multiple other smaller fluid collections, colostomy noted, mild free fluid in posterior pelvis  Micro Data:  COVID 3/21 >> Negative MRSA PCR 3/21 >> Negative Blood cultures 3/27 >>negative  Tracheal Aspirate 3/29 >> Saccharomyces cerevisiae Trach aspirate 4/2 >> saccharomyces cerevisiae abscess abdomen 4/7 >> pseudomonas  Antimicrobials:  Zosyn 3/21 >> 3/29 Anidulafungin 3/31 >> 4/5 Meropenem 3/29 >> 4/13  Interim history/subjective:   Weaned on trach collar on 4/21 for several hours. Was up in the chair for 8 hours yesterday.  Objective   Blood pressure (!) 110/50, pulse 73, temperature (!) 97.5 F (36.4 C), temperature source Oral, resp. rate 19, height 5' 2" (1.575 m), weight 70 kg, last menstrual period 12/14/2000, SpO2 100 %.    Vent Mode: PRVC FiO2 (%):  [28 %-30 %] 30 % Set Rate:  [10 bmp] 10 bmp Vt Set:  [400 mL] 400 mL PEEP:  [5 cmH20] 5 cmH20 Plateau Pressure:  [14 cmH20-21 cmH20] 14 cmH20   Intake/Output Summary (Last 24 hours) at 04/04/2020 0740 Last data filed at 04/04/2020 0630 Gross per 24 hour  Intake 3282.67 ml  Output 1165 ml  Net 2117.67 ml   Filed Weights   04/02/20 0500 04/03/20 0500 04/04/20 0500  Weight: 68.1 kg 68.9 kg 70 kg    Examination:  General:  Elderly woman laying in bed in NAD HENT: [place]/AT, eyes anicteric, NGT in place Neck: trach in place PULM: Breathing comfortably on the vent. CTAB, diminished breath sounds. CV: regular rate & rhythm, no murmurs DJ:SHFW,  NT, ND. Ostomy pink with stool. Bandage c/d/i. Drain with white opaque fluid output on the left. MSK: appropriate muscle mass. Ongoing edema, especially in RUE.  Derm: RUE picc without erythema Neuro: awake, intermittently falling asleep, but easily arousable. Moving all extremities, answering questions appropriately.   Resolved Hospital Problem list     Assessment &  Plan:  Acute hypoxemic respiratory failure  Prolonged vent weaning, s/p tracheostomy Severe COPD/Emphysema Continue brovana and pulmicort plus duonebs PRN Con't trach collar during the day, goal >8  hours Full vent support at night. Planning for LTAC. Her [facility] is going [facility], which have both been approved by her insurance. She has a good chance of ongoing weaning and liberation from the vent.  Bowel perforation/partial colectomy Colon cancer Moderate malnutrition Con't TF per surgery recs Stool softener added per surgery recommendations  Anxiety Continue low dose clonazepam bid Xanax PRN Family at bedside helps  Abdominal abscess with pseudomonas Monitor while off antibiotics Drain management per surgery  Hypertension : resolved Pain control as needed con't to monitor  Anemia of chronic illness Monitor for bleeding Transfuse PRBC for Hgb < 7 gm/dL  Hyperglycemia, well controlled SSI accuchecks  R>L UE edema -[name]   Best practice:  Diet: tube feeding Pain/Anxiety/Delirium protocol (if indicated):  VAP protocol (if indicated): yes DVT prophylaxis: sub q heparin GI prophylaxis: Pantoprazole for stress ulcer prophylaxis Glucose control: SSI Mobility: PT consult Code Status: DNR if arrests Family Communication: husband Danny updated at bedside Disposition: remain in ICU   Labs   CBC: Recent Labs  Lab 03/28/20 1030 03/30/20 0313 04/01/20 0319 04/02/20 0613  WBC 7.1 6.4 8.2 7.8  NEUTROABS  --   --  5.5  --   HGB 7.5* 7.6* 8.3* 7.8*  HCT 25.0* 24.9* 27.0* 25.3*  MCV 100.8* 98.0 97.1 97.7  PLT 351 332 454* 516*    Basic Metabolic Panel: Recent Labs  Lab 03/30/20 0313 03/31/20 0410 04/01/20 0319 04/02/20 0613 04/04/20 0418  NA 139 139 138 137 138  K 3.6 3.8 3.1* 4.0 3.7  CL 107 104 99 100 101  CO2 _0 GLUCOSE 122* 128* 145* 140* 125*  BUN 35* 36* 41* 38* 28*  CREATININE 0.41* 0.38* 0.39* 0.38* 0.32*  CALCIUM  8.0* 8.3* 8.2* 8.0* 8.4*  MG  --   --  1.6*  --  1.8  PHOS  --   --  4.2  --  3.9   GFR: Estimated Creatinine Clearance: 62.6 mL/min (A) (by C-G formula based on SCr of 0.32 mg/dL (L)). Recent Labs  Lab 03/28/20 1030 03/30/20 0313 04/01/20 0319 04/02/20 0613  WBC 7.1 6.4 8.2 7.8    Liver Function Tests: Recent Labs  Lab 04/01/20 0319 04/04/20 0418  AST 35 29  ALT 50* 39  ALKPHOS 251* 186*  BILITOT 0.6 0.4  PROT 5.5* 5.2*  ALBUMIN 2.0* 2.0*   No results for input(s): LIPASE, AMYLASE in the last 168 hours. No results for input(s): AMMONIA in the last 168 hours.  ABG    Component Value Date/Time   PHART 7.398 03/16/2020 2245   PCO2ART 45.6 03/16/2020 2245   PO2ART 70.3 (L) 03/16/2020 2245   HCO3 27.7 03/16/2020 2245   TCO2 24 03/12/2020 1513   ACIDBASEDEF 4.8 (H) 03/12/2020 1738   O2SAT 93.5 03/16/2020 2245     Coagulation Profile: No results for input(s): INR, PROTIME in the last 168 hours.  Cardiac Enzymes: No results for input(s): CKTOTAL,  CKMB, CKMBINDEX, TROPONINI in the last 168 hours.  HbA1C: Hgb A1c MFr Bld  Date/Time Value Ref Range Status  03/11/2020 05:00 AM 5.9 (H) 4.8 - 5.6 % Final    Comment:    (NOTE) Pre diabetes:          5.7%-6.4% Diabetes:              >6.4% Glycemic control for   <7.0% adults with diabetes     CBG: Recent Labs  Lab 04/03/20 1241 04/03/20 1630 04/03/20 2033 04/03/20 2318 04/04/20 0317  GLUCAP 127* 99 104* 99 99       Julian Hy, DO 04/04/20 8:27 AM [facility]

## 2020-04-05 ENCOUNTER — Inpatient Hospital Stay
Admission: RE | Admit: 2020-04-05 | Discharge: 2020-05-01 | Disposition: A | Discharge status: Rehabilitation Facility | Payer: Medicare HMO | Source: Other Acute Inpatient Hospital | Attending: Internal Medicine | Admitting: Internal Medicine

## 2020-04-05 ENCOUNTER — Other Ambulatory Visit (HOSPITAL_COMMUNITY): Payer: Medicare HMO

## 2020-04-05 ENCOUNTER — Inpatient Hospital Stay (HOSPITAL_COMMUNITY): Payer: Medicare HMO

## 2020-04-05 DIAGNOSIS — R609 Edema, unspecified: Secondary | ICD-10-CM | POA: Diagnosis not present

## 2020-04-05 DIAGNOSIS — Z9889 Other specified postprocedural states: Secondary | ICD-10-CM

## 2020-04-05 DIAGNOSIS — K631 Perforation of intestine (nontraumatic): Secondary | ICD-10-CM | POA: Diagnosis not present

## 2020-04-05 DIAGNOSIS — K912 Postsurgical malabsorption, not elsewhere classified: Secondary | ICD-10-CM | POA: Diagnosis not present

## 2020-04-05 DIAGNOSIS — B965 Pseudomonas (aeruginosa) (mallei) (pseudomallei) as the cause of diseases classified elsewhere: Secondary | ICD-10-CM | POA: Diagnosis not present

## 2020-04-05 DIAGNOSIS — J9600 Acute respiratory failure, unspecified whether with hypoxia or hypercapnia: Secondary | ICD-10-CM | POA: Diagnosis not present

## 2020-04-05 DIAGNOSIS — K651 Peritoneal abscess: Secondary | ICD-10-CM | POA: Diagnosis not present

## 2020-04-05 DIAGNOSIS — J449 Chronic obstructive pulmonary disease, unspecified: Secondary | ICD-10-CM | POA: Diagnosis not present

## 2020-04-05 DIAGNOSIS — Z433 Encounter for attention to colostomy: Secondary | ICD-10-CM | POA: Diagnosis not present

## 2020-04-05 DIAGNOSIS — I82622 Acute embolism and thrombosis of deep veins of left upper extremity: Secondary | ICD-10-CM | POA: Diagnosis not present

## 2020-04-05 DIAGNOSIS — J189 Pneumonia, unspecified organism: Secondary | ICD-10-CM

## 2020-04-05 DIAGNOSIS — Z9911 Dependence on respirator [ventilator] status: Secondary | ICD-10-CM | POA: Diagnosis not present

## 2020-04-05 DIAGNOSIS — J9690 Respiratory failure, unspecified, unspecified whether with hypoxia or hypercapnia: Secondary | ICD-10-CM

## 2020-04-05 DIAGNOSIS — R579 Shock, unspecified: Secondary | ICD-10-CM | POA: Diagnosis not present

## 2020-04-05 DIAGNOSIS — R1084 Generalized abdominal pain: Secondary | ICD-10-CM | POA: Diagnosis not present

## 2020-04-05 DIAGNOSIS — R279 Unspecified lack of coordination: Secondary | ICD-10-CM | POA: Diagnosis not present

## 2020-04-05 DIAGNOSIS — Y848 Other medical procedures as the cause of abnormal reaction of the patient, or of later complication, without mention of misadventure at the time of the procedure: Secondary | ICD-10-CM | POA: Diagnosis not present

## 2020-04-05 DIAGNOSIS — C20 Malignant neoplasm of rectum: Secondary | ICD-10-CM | POA: Diagnosis not present

## 2020-04-05 DIAGNOSIS — K5900 Constipation, unspecified: Secondary | ICD-10-CM | POA: Diagnosis not present

## 2020-04-05 DIAGNOSIS — Z743 Need for continuous supervision: Secondary | ICD-10-CM | POA: Diagnosis not present

## 2020-04-05 DIAGNOSIS — I82621 Acute embolism and thrombosis of deep veins of right upper extremity: Secondary | ICD-10-CM | POA: Diagnosis not present

## 2020-04-05 DIAGNOSIS — T82868A Thrombosis of vascular prosthetic devices, implants and grafts, initial encounter: Secondary | ICD-10-CM | POA: Diagnosis not present

## 2020-04-05 DIAGNOSIS — J9621 Acute and chronic respiratory failure with hypoxia: Secondary | ICD-10-CM | POA: Diagnosis present

## 2020-04-05 DIAGNOSIS — R69 Illness, unspecified: Secondary | ICD-10-CM | POA: Diagnosis not present

## 2020-04-05 DIAGNOSIS — J9811 Atelectasis: Secondary | ICD-10-CM | POA: Diagnosis not present

## 2020-04-05 DIAGNOSIS — R109 Unspecified abdominal pain: Secondary | ICD-10-CM | POA: Diagnosis not present

## 2020-04-05 DIAGNOSIS — Z930 Tracheostomy status: Secondary | ICD-10-CM | POA: Diagnosis not present

## 2020-04-05 DIAGNOSIS — J9691 Respiratory failure, unspecified with hypoxia: Secondary | ICD-10-CM | POA: Diagnosis not present

## 2020-04-05 DIAGNOSIS — G8928 Other chronic postprocedural pain: Secondary | ICD-10-CM | POA: Diagnosis not present

## 2020-04-05 DIAGNOSIS — R188 Other ascites: Secondary | ICD-10-CM | POA: Diagnosis not present

## 2020-04-05 DIAGNOSIS — A419 Sepsis, unspecified organism: Secondary | ICD-10-CM | POA: Diagnosis not present

## 2020-04-05 DIAGNOSIS — J90 Pleural effusion, not elsewhere classified: Secondary | ICD-10-CM

## 2020-04-05 DIAGNOSIS — G8918 Other acute postprocedural pain: Secondary | ICD-10-CM | POA: Diagnosis not present

## 2020-04-05 DIAGNOSIS — Z4682 Encounter for fitting and adjustment of non-vascular catheter: Secondary | ICD-10-CM | POA: Diagnosis not present

## 2020-04-05 DIAGNOSIS — E43 Unspecified severe protein-calorie malnutrition: Secondary | ICD-10-CM | POA: Diagnosis not present

## 2020-04-05 DIAGNOSIS — Z0189 Encounter for other specified special examinations: Secondary | ICD-10-CM

## 2020-04-05 DIAGNOSIS — Z430 Encounter for attention to tracheostomy: Secondary | ICD-10-CM | POA: Diagnosis not present

## 2020-04-05 DIAGNOSIS — R531 Weakness: Secondary | ICD-10-CM | POA: Diagnosis not present

## 2020-04-05 DIAGNOSIS — J9601 Acute respiratory failure with hypoxia: Secondary | ICD-10-CM | POA: Diagnosis not present

## 2020-04-05 DIAGNOSIS — T8143XA Infection following a procedure, organ and space surgical site, initial encounter: Secondary | ICD-10-CM | POA: Diagnosis not present

## 2020-04-05 DIAGNOSIS — C218 Malignant neoplasm of overlapping sites of rectum, anus and anal canal: Secondary | ICD-10-CM | POA: Diagnosis not present

## 2020-04-05 DIAGNOSIS — R0602 Shortness of breath: Secondary | ICD-10-CM | POA: Diagnosis not present

## 2020-04-05 DIAGNOSIS — R52 Pain, unspecified: Secondary | ICD-10-CM | POA: Diagnosis not present

## 2020-04-05 DIAGNOSIS — S31109D Unspecified open wound of abdominal wall, unspecified quadrant without penetration into peritoneal cavity, subsequent encounter: Secondary | ICD-10-CM | POA: Diagnosis not present

## 2020-04-05 HISTORY — DX: Acute and chronic respiratory failure with hypoxia: J96.21

## 2020-04-05 HISTORY — DX: Tracheostomy status: Z93.0

## 2020-04-05 HISTORY — DX: Acute embolism and thrombosis of deep veins of right upper extremity: I82.621

## 2020-04-05 LAB — GLUCOSE, CAPILLARY
Glucose-Capillary: 111 mg/dL — ABNORMAL HIGH (ref 70–99)
Glucose-Capillary: 130 mg/dL — ABNORMAL HIGH (ref 70–99)
Glucose-Capillary: 111 mg/dL — ABNORMAL HIGH (ref 70–99)
Glucose-Capillary: 103 mg/dL — ABNORMAL HIGH (ref 70–99)

## 2020-04-05 MED ORDER — BETHANECHOL CHLORIDE 25 MG PO TABS: 25.0000 mg | ORAL_TABLET | Freq: Three times a day (TID) | ORAL | Status: DC

## 2020-04-05 MED ORDER — VITAL HIGH PROTEIN PO LIQD: 1000.0000 mL | ORAL | Status: DC

## 2020-04-05 MED ORDER — ORAL CARE MOUTH RINSE: 15.0000 mL | Freq: Every day | OROMUCOSAL | 0 refills | Status: DC

## 2020-04-05 MED ORDER — MIDAZOLAM HCL 2 MG/2ML IJ SOLN: 0.5000 mg | INTRAMUSCULAR | 0 refills | Status: DC | PRN

## 2020-04-05 MED ORDER — APIXABAN 5 MG PO TABS: 5.0000 mg | ORAL_TABLET | Freq: Two times a day (BID) | ORAL | Status: DC

## 2020-04-05 MED ORDER — IPRATROPIUM-ALBUTEROL 0.5-2.5 (3) MG/3ML IN SOLN: 3.0000 mL | RESPIRATORY_TRACT | Status: DC | PRN

## 2020-04-05 MED ORDER — POLYETHYLENE GLYCOL 3350 17 G PO PACK
17.0000 g | PACK | Freq: Every day | ORAL | Status: DC
  Administered 2020-04-05: 11:00:00 17 g
  Filled 2020-04-05: qty 1

## 2020-04-05 MED ORDER — ALPRAZOLAM 0.25 MG PO TABS: 0.2500 mg | ORAL_TABLET | Freq: Three times a day (TID) | ORAL | 0 refills | Status: DC | PRN

## 2020-04-05 MED ORDER — SODIUM CHLORIDE 0.9 % IV SOLN: 250.0000 mL | INTRAVENOUS | 0 refills | Status: DC | PRN

## 2020-04-05 MED ORDER — APIXABAN 5 MG PO TABS
10.0000 mg | ORAL_TABLET | Freq: Two times a day (BID) | ORAL | Status: DC
  Administered 2020-04-05: 17:00:00 10 mg
  Filled 2020-04-05: qty 2

## 2020-04-05 MED ORDER — OXYCODONE HCL 5 MG/5ML PO SOLN: 5.0000 mg | Freq: Four times a day (QID) | ORAL | 0 refills | Status: DC | PRN

## 2020-04-05 MED ORDER — HYDROMORPHONE HCL 1 MG/ML IJ SOLN: 0.5000 mg | INTRAMUSCULAR | 0 refills | Status: DC | PRN

## 2020-04-05 MED ORDER — SODIUM CHLORIDE 0.9% FLUSH: 3.0000 mL | Freq: Two times a day (BID) | INTRAVENOUS | Status: DC

## 2020-04-05 MED ORDER — SODIUM CHLORIDE 0.9% FLUSH: 10.0000 mL | Freq: Two times a day (BID) | INTRAVENOUS | Status: DC

## 2020-04-05 MED ORDER — APIXABAN 5 MG PO TABS: 10.0000 mg | ORAL_TABLET | Freq: Two times a day (BID) | ORAL | Status: DC

## 2020-04-05 MED ORDER — CLONAZEPAM 0.5 MG PO TBDP: 0.5000 mg | ORAL_TABLET | Freq: Two times a day (BID) | ORAL | 0 refills | Status: DC

## 2020-04-05 MED ORDER — PANTOPRAZOLE SODIUM 40 MG IV SOLR: 40.0000 mg | Freq: Every day | INTRAVENOUS | Status: DC

## 2020-04-05 MED ORDER — ARFORMOTEROL TARTRATE 15 MCG/2ML IN NEBU: 15.0000 ug | INHALATION_SOLUTION | Freq: Two times a day (BID) | RESPIRATORY_TRACT | Status: DC

## 2020-04-05 MED ORDER — DOCUSATE SODIUM 50 MG/5ML PO LIQD: 100.0000 mg | Freq: Two times a day (BID) | ORAL | 0 refills | Status: DC

## 2020-04-05 MED ORDER — BUDESONIDE 0.5 MG/2ML IN SUSP: 0.5000 mg | Freq: Two times a day (BID) | RESPIRATORY_TRACT | 12 refills | Status: DC

## 2020-04-05 MED ORDER — INSULIN ASPART 100 UNIT/ML ~~LOC~~ SOLN: 0.0000 [IU] | SUBCUTANEOUS | 11 refills | Status: DC

## 2020-04-05 MED ORDER — POLYETHYLENE GLYCOL 3350 17 G PO PACK: 17.0000 g | PACK | Freq: Every day | ORAL | 0 refills | Status: DC

## 2020-04-05 MED ORDER — FREE WATER: 200.0000 mL | Status: DC

## 2020-04-05 MED ORDER — CHLORHEXIDINE GLUCONATE 0.12% ORAL RINSE (MEDLINE KIT): 15.0000 mL | Freq: Two times a day (BID) | OROMUCOSAL | 0 refills | Status: DC

## 2020-04-05 MED ORDER — ONDANSETRON HCL 4 MG/2ML IJ SOLN: 4.0000 mg | Freq: Four times a day (QID) | INTRAMUSCULAR | 0 refills | Status: DC | PRN

## 2020-04-05 MED ORDER — LABETALOL HCL 5 MG/ML IV SOLN: 5.0000 mg | INTRAVENOUS | Status: DC | PRN

## 2020-04-05 NOTE — Progress Notes (Signed)
NAME:  Tracy [name], MRN:  [id number], DOB:  [birth year], LOS: 75 ADMISSION DATE:  03/03/2020, CONSULTATION DATE:  3/27 REFERRING MD:  Ninfa Linden, CHIEF COMPLAINT:  Abdominal pain   Brief History   69 y/o female admitted 3/21 with bowel perforation requiring colostomy and lower anterior resection in setting of newly diagnosed proximal rectal cancer with sigmoid perforation.  Post op developed septic shock, HCAP AKI ileus and acute hypoxemic respiratory failure requiring intubation.   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 Valeta Harms. Not on O2.  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, metastatic carcinoma in 3/15 lymph nodes  LE [name] 3/27 >> negative for DVT  ECHO 3/27 >> poor windows, normal LV function, severely dilated RV with moderately reduced function, RV volume and pressure overload, mildly dilated RA  V/Q Scan 3/30 >> low probability for PE  4/6 CT chest abdomen pelvis> 5.5x3.2 fluid collection along stomach, surgical drain again noted in pelvis, interval development of crescent shaped fluid  collection measuring 16.4 x 3.3 in epigastric region and left upper quadrant of abdomen (could be abscess) multiple other smaller fluid collections, colostomy noted, mild free fluid in posterior pelvis  Micro Data:  COVID 3/21 >> Negative MRSA PCR 3/21 >> Negative Blood cultures 3/27 >>negative  Tracheal Aspirate 3/29 >> Saccharomyces cerevisiae Trach aspirate 4/2 >> saccharomyces cerevisiae abscess abdomen 4/7 >> pseudomonas  Antimicrobials:  Zosyn 3/21 >> 3/29 Anidulafungin 3/31 >> 4/5 Meropenem 3/29 >> 4/13  Interim history/subjective:   Weaned on trach collar most of the day yesterday Stool output down last three days, passing gas  Objective   Blood pressure (!) 108/42, pulse 80, temperature 98.6 F (37 C), temperature source Oral, resp. rate 18, height 5' 2"  (1.575 m), weight 70 kg, last menstrual period 12/14/2000, SpO2 99 %.    Vent Mode: PRVC FiO2 (%):  [28 %-30 %] 30 % Set Rate:  [10 bmp] 10 bmp Vt Set:  [400 mL] 400 mL PEEP:  [5 cmH20] 5 cmH20 Plateau Pressure:  [11 cmH20-22 cmH20] 22 cmH20   Intake/Output Summary (Last 24 hours) at 04/05/2020 0747 Last data filed at 04/05/2020 0645 Gross per 24 hour  Intake 1430 ml  Output 470 ml  Net 960 ml   Filed Weights   04/02/20 0500 04/03/20 0500 04/04/20 0500  Weight: 68.1 kg 68.9 kg 70 kg    Examination:  General:  In bed on vent HENT: NCAT tracheostomy in place PULM: Few crackles bases B, breathing without ventilator support CV: RRR, no mgr GI: BS+, soft, nontender, ostomy MSK: normal bulk and tone Neuro: awake on vent  Resolved Hospital Problem list     Assessment & Plan:  Acute hypoxemic respiratory failure  Failure to wean, s/p tracheostomy Severe COPD/Emphysema Continue bronchodilator and inhaled corticosteroids Trach collar again throughout day, goal 12 hours, vent again at night Out of bed to chair Trach care per routine  Bowel perforation/partial colectomy Colon cancer Moderate  malnutrition Constipation Continue enteric feeding per general surgery recommendations Bowel regimen per general surgery  Anxiety> improved Continue low dose clonazepam bid Prn xanax  Abdominal abscess with pseudomonas Monitor off antibiotics    Best practice:  Diet: tube feeding Pain/Anxiety/Delirium protocol (if indicated):  VAP protocol (if indicated): yes DVT prophylaxis: sub q heparin GI prophylaxis: Pantoprazole for stress ulcer prophylaxis Glucose control: SSI Mobility: PT consult Code Status: DNR if arrests Family Communication: Danny updated bedside Disposition: to [facility]   Labs   CBC: Recent Labs  Lab 03/30/20 0313 04/01/20 0319 04/02/20 0613  WBC 6.4 8.2 7.8  NEUTROABS  --  5.5  --   HGB 7.6* 8.3* 7.8*  HCT 24.9* 27.0* 25.3*  MCV 98.0 97.1 97.7  PLT 332 454* 516*    Basic Metabolic Panel: Recent Labs  Lab 03/30/20 0313 03/31/20 0410 04/01/20 0319 04/02/20 0613 04/04/20 0418  NA 139 139 138 137 138  K 3.6 3.8 3.1* 4.0 3.7  CL 107 104 99 100 101  CO2 27 29 28 29 29   GLUCOSE 122* 128* 145* 140* 125*  BUN 35* 36* 41* 38* 28*  CREATININE 0.41* 0.38* 0.39* 0.38* 0.32*  CALCIUM 8.0* 8.3* 8.2* 8.0* 8.4*  MG  --   --  1.6*  --  1.8  PHOS  --   --  4.2  --  3.9   GFR: Estimated Creatinine Clearance: 62.6 mL/min (A) (by C-G formula based on SCr of 0.32 mg/dL (L)). Recent Labs  Lab 03/30/20 0313 04/01/20 0319 04/02/20 0613  WBC 6.4 8.2 7.8    Liver Function Tests: Recent Labs  Lab 04/01/20 0319 04/04/20 0418  AST 35 29  ALT 50* 39  ALKPHOS 251* 186*  BILITOT 0.6 0.4  PROT 5.5* 5.2*  ALBUMIN 2.0* 2.0*   No results for input(s): LIPASE, AMYLASE in the last 168 hours. No results for input(s): AMMONIA in the last 168 hours.  ABG    Component Value Date/Time   PHART 7.398 03/16/2020 2245   PCO2ART 45.6 03/16/2020 2245   PO2ART 70.3 (L) 03/16/2020 2245   HCO3 27.7 03/16/2020 2245   TCO2 24 03/12/2020 1513   ACIDBASEDEF 4.8 (H)  03/12/2020 1738   O2SAT 93.5 03/16/2020 2245     Coagulation Profile: No results for input(s): INR, PROTIME in the last 168 hours.  Cardiac Enzymes: No results for input(s): CKTOTAL, CKMB, CKMBINDEX, TROPONINI in the last 168 hours.  HbA1C: Hgb A1c MFr Bld  Date/Time Value Ref Range Status  03/11/2020 05:00 AM 5.9 (H) 4.8 - 5.6 % Final    Comment:    (NOTE) Pre diabetes:          5.7%-6.4% Diabetes:              >6.4% Glycemic control for   <7.0% adults with diabetes     CBG: Recent Labs  Lab 04/04/20 1712 04/04/20 1939 04/04/20 2342 04/05/20 0333 04/05/20 0740  GLUCAP 110* 118* 113* 111* 130*       Critical care time: n/a     Roselie Awkward, MD [place] [facility] Pager: [phone] Cell: [phone] If no response, call [phone]

## 2020-04-05 NOTE — Progress Notes (Signed)
[facility] arrived for patient, could not take patient due to her need for telemetry monitoring. PTAR called dispatch and notified nurse that another ambulance would be on its way to take patient to [facility]. Agricultural consultant, Engineer, manufacturing, notified. Will continue to monitor patient

## 2020-04-05 NOTE — Progress Notes (Addendum)
  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  Pt was encountered awake with nursing at bedside.  Pt has a Shiley #6 cuffed trach with cuff deflated upon SLP arrival.  SLP provided re-educated to pt regarding Passy Muir Speaking Valve (PMV) use, physiological change with use.    Pt with much improved direction following with timing phonation/exhalation.  At rest, HR was at 93, SpO2 was 96, and RR was 24 prior to PMV trials.  SLP placed PMV and pt tolerated for approximately 4-5 minutes before removal.  Pt was able to achieve phonation and she was 100% intelligible at the word level.  HR ranged from 93-94, SpO2 ranged from 96-97, and RR ranged from 24-34.  Pt appeared to be mildly uncomfortable and she was noted to continue to use her accessory muscles during all respirations - with increased intensity as trial progressed.  She reported sensing anxiety and shortness of breath and requested rest break after approximately 13 minutes.  No back pressure was observed when PMV was removed every few minutes. Momentary removal during session only.    Pt tolerating valve well for approximately 10 minutes - fatigue and dyspnea are her largest barriers to consistent use.   Recommend to continue PMSV use - no family present at this time.  Will continue efforts to educate spouse to use with return demonstration to maximize pt communication ease/efficiency with her family and her care team.    Recommend to consider changing larger bore feeding tube to small bore to help facilitate pt's swallowing, airway protection and comfort.  Pulmonary MD *McQuaid* was agreeable from his perspective but  deferred to surgery for decision.     HPI HPI: 69 y/o female admitted 3/21 with bowel perforation requiring colostomy and lower anterior resection in setting of newly diagnosed proximal rectal cancer with sigmoid perforation.  Post op developed septic shock, HCAP AKI ileus and acute hypoxemic respiratory failure requiring intubation. Pt was intubated from 3/27-4/7 with tracheostomy on 4/7.        SLP Plan  Continue with current plan of care       Recommendations  Medication Administration: Via alternative means      Patient may use Passy-Muir Speech Valve: with SLP only PMSV Supervision: Full MD: Please consider changing trach tube to : Smaller size;Cuffless(when medically appropriate)         Oral Care Recommendations: Oral care QID;Staff/trained caregiver to provide oral care Follow up Recommendations: LTACH SLP Visit Diagnosis: Aphonia (R49.1) Plan: Continue with current plan of care       GO                [name] 04/05/2020, 11:26 AM   Kathleen Lime, MS [place] Office [phone]

## 2020-04-05 NOTE — Progress Notes (Signed)
24 Days Post-Op  Subjective: No stool yet after colace started.  No abdominal pain.  Duplex not done yesterday for some reason.  No other complaints.  Husband decided on [facility]  ROS: unable, on vent  Objective: Vital signs in last 24 hours: Temp:  [97.1 F (36.2 C)-98.7 F (37.1 C)] 98.6 F (37 C) (04/23 0347) Pulse Rate:  [79-94] 80 (04/23 0600) Resp:  [16-24] 18 (04/23 0600) BP: (92-126)/(40-72) 108/42 (04/23 0600) SpO2:  [94 %-100 %] 99 % (04/23 0600) FiO2 (%):  [28 %-30 %] 28 % (04/23 0826) Last BM Date: 04/02/20  Intake/Output from previous day: 04/22 0701 - 04/23 0700 In: 1430 [NG/GT:1430] Out: 470 [Urine:450; Drains:20] Intake/Output this shift: No intake/output data recorded.  PE: Neck: trach in place on vent currently Abd: soft, stable midline wound with some fibrin present but with some granular tissue present as well.  Left sided colostomy, stoma is pink and viable, air in bag.  JP drain in place still with sero-cloudy drainage present.  Ext: RUE with significantly more edema than LUE.  This arm has her PICC in place  Lab Results:  No results for input(s): WBC, HGB, HCT, PLT in the last 72 hours. BMET Recent Labs    04/04/20 0418  NA 138  K 3.7  CL 101  CO2 29  GLUCOSE 125*  BUN 28*  CREATININE 0.32*  CALCIUM 8.4*   PT/INR No results for input(s): LABPROT, INR in the last 72 hours. CMP     Component Value Date/Time   NA 138 04/04/2020 0418   K 3.7 04/04/2020 0418   CL 101 04/04/2020 0418   CO2 29 04/04/2020 0418   GLUCOSE 125 (H) 04/04/2020 0418   BUN 28 (H) 04/04/2020 0418   CREATININE 0.32 (L) 04/04/2020 0418   CALCIUM 8.4 (L) 04/04/2020 0418   PROT 5.2 (L) 04/04/2020 0418   ALBUMIN 2.0 (L) 04/04/2020 0418   AST 29 04/04/2020 0418   ALT 39 04/04/2020 0418   ALKPHOS 186 (H) 04/04/2020 0418   BILITOT 0.4 04/04/2020 0418   GFRNONAA >60 04/04/2020 0418   GFRAA >60 04/04/2020 0418   Lipase     Component Value Date/Time   LIPASE 18  03/03/2020 1214       Studies/Results: No results found.  Anti-infectives: Anti-infectives (From admission, onward)   Start     Dose/Rate Route Frequency Ordered Stop   03/18/20 0900  meropenem (MERREM) 1 g in sodium chloride 0.9 % 100 mL IVPB  Status:  Discontinued     1 g 200 mL/hr over 30 Minutes Intravenous Every 8 hours 03/18/20 0838 03/26/20 1106   03/15/20 2200  meropenem (MERREM) 1 g in sodium chloride 0.9 % 100 mL IVPB  Status:  Discontinued     1 g 200 mL/hr over 30 Minutes Intravenous Every 12 hours 03/15/20 1021 03/18/20 0838   03/14/20 1000  anidulafungin (ERAXIS) 100 mg in sodium chloride 0.9 % 100 mL IVPB  Status:  Discontinued     100 mg 78 mL/hr over 100 Minutes Intravenous Every 24 hours 03/13/20 0752 03/18/20 0849   03/13/20 0900  anidulafungin (ERAXIS) 200 mg in sodium chloride 0.9 % 200 mL IVPB     200 mg 78 mL/hr over 200 Minutes Intravenous  Once 03/13/20 0752 03/13/20 1241   03/11/20 2000  meropenem (MERREM) 500 mg in sodium chloride 0.9 % 100 mL IVPB  Status:  Discontinued     500 mg 200 mL/hr over 30 Minutes Intravenous Every  12 hours 03/11/20 1658 03/15/20 1021   03/11/20 1200  piperacillin-tazobactam (ZOSYN) IVPB 2.25 g  Status:  Discontinued     2.25 g 100 mL/hr over 30 Minutes Intravenous Every 6 hours 03/11/20 1058 03/11/20 1641   03/06/20 1200  piperacillin-tazobactam (ZOSYN) IVPB 3.375 g  Status:  Discontinued     3.375 g 12.5 mL/hr over 240 Minutes Intravenous Every 8 hours 03/06/20 0739 03/11/20 1058   03/05/20 1400  piperacillin-tazobactam (ZOSYN) IVPB 2.25 g  Status:  Discontinued     2.25 g 100 mL/hr over 30 Minutes Intravenous Every 8 hours 03/05/20 0752 03/06/20 0739   03/04/20 2200  piperacillin-tazobactam (ZOSYN) IVPB 3.375 g  Status:  Discontinued     3.375 g 12.5 mL/hr over 240 Minutes Intravenous Every 8 hours 03/03/20 2032 03/05/20 0752   03/03/20 1545  cefoTEtan (CEFOTAN) 2 g in sodium chloride 0.9 % 100 mL IVPB     2 g 200 mL/hr  over 30 Minutes Intravenous On call to O.R. 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 AKI- Cr normalized ABL anemia -stable Malnutrition - TFat goal, TPN stopped 4/13 Urinary retention - will start urecholine to help with voiding. If she is unable to void soon, she will require I&O cath RUE edema - duplex pending, have asked RN to call vascular to have them come do this as it was not done yesterday for some reason  Perforated sigmoid colon Proximal rectal cancer with obstruction POD 34,S/p emergent Low anterior resection/ colostomy by Dr. Marcello Moores 03/03/20 POD 24,S/p exploratory laparotomy and colon resection with transverse colostomy 3/31 Dr. Hassell Done -wet to drydressing changesto midline abdominal wound, will add Dakin's for 3 days to eradicate pseudomonal colonization.  This has been completed.  Return to NS WD dressing changes - WOC teamfollowing for ostomy teaching.  Stoma seen today.  Some mucocutaneous separation noted, but nothing to do currently.  Stoma still working well and is viable. - s/p IR drain x2 4/7,culturePSEUDOMONAS AERUGINOSA, completed course of meropenem -repeat CT shows stable to improvement in fluid collections. -surgical drain and drain #2 from IR removed on 4/20, drain #1 remains in place -given decrease in stool output from ostomy over the last 2-3 days and not stricture on exam, will give colace BID and add miralax today.  FEN -NPO, TF at55cc/hr VTE - SCDs,SQ heparin ID - Zosyn3/21>3/29; Eraxis 3/31>>4/5,Merrem 3/29>>4/13 [place] -wick Dispo - insurance approved LTAC.  Husband picked [facility], will await notification  from CM when bed available.   LOS: 33 days    Tracy [name] , [facility] 04/05/2020, 8:41 AM Please see Amion for pager number during day hours 7:00am-4:30pm or 7:00am -11:30am on weekends

## 2020-04-05 NOTE — Progress Notes (Signed)
PT Cancellation Note  Patient Details Name: Tracy [name] MRN: [id number] DOB: [birth year]   Cancelled Treatment:     PT held this am pending [name] of UEs and possible dc to [facility].  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  Will follow.   Melrose Kearse 04/05/2020, 2:43 PM

## 2020-04-05 NOTE — Progress Notes (Signed)
RT placed pt on Trach Collar 28%.

## 2020-04-05 NOTE — Progress Notes (Signed)
Per Saverio Danker, PA's discharge note patient will leave with PICC intact. Notified Christian, Warehouse manager.

## 2020-04-05 NOTE — TOC Progression Note (Addendum)
Transition of Care [facility]) - Progression Note    Patient Details  Name: Tracy [name] MRN: [id number] Date of Birth: [birth year]  [facility]) CM/SW Contact  Leeroy Cha, RN Phone Number: 04/05/2020, 8:55 AM  Clinical Narrative:    Assignment at [facility]= Room 5E30 Attending :DR. Satira Sark Call [phone] for report Please fax dc summary to [phone] Med necessity form completed  Number for transport is [phone] Packet with information given allison RN  5/tcf-Ericka Quentin Cornwall with [facility]-pt should have bed today once dc's are done. [facility] awaiting radiology scan on arm.  Family is aware.  Expected Discharge Plan: [place] Barriers to Discharge: Continued Medical Work up  Expected Discharge Plan and Services Expected Discharge Plan: [place] In-house Referral: Clinical Social Work     Living arrangements for the past 2 months: Single Family Home                                       Social Determinants of Health (SDOH) Interventions    Readmission Risk Interventions Readmission Risk Prevention Plan 03/25/2020 03/04/2020  Medication Screening - Complete  Transportation Screening Complete Complete  Medication Review (RN Care Manager) Complete -  Some recent data might be hidden

## 2020-04-05 NOTE — Progress Notes (Signed)
[facility] P[facility]M  R UE extremity DVT noted Recommend pulling out PICC line, 30 days eliquis  Roselie Awkward, MD [place] [facility] Pager: [phone] Cell: [phone] If no response, call [phone]

## 2020-04-05 NOTE — Progress Notes (Signed)
Upper venous duplex       has been completed. Preliminary results can be found under CV proc through chart review. Keili Hasten, BS, RDMS, RVT   

## 2020-04-05 NOTE — Discharge Summary (Signed)
Patient ID: Tracy [name] [id number] 10/21/1951 69 y.o.  Admit date: 03/03/2020 Discharge date: 04/05/2020  Admitting Diagnosis: Perforated colon with pneumoperitoneum  COPD Asthma  Discharge Diagnosis Patient Active Problem List   Diagnosis Date Noted  . Acute respiratory failure with hypoxemia ([address])   . Shock circulatory ([place])   . Perforated sigmoid colon ([place]) 03/03/2020  . ALLERGIC RHINITIS, SEASONAL 01/06/2011  . HIP PAIN, LEFT 01/06/2011  . ELEVATED BP READING WITHOUT DX HYPERTENSION 01/06/2011  . CHICKENPOX, HX OF 01/06/2011  . EMPHYSEMA 11/03/2007  . 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 Chase Caller - Critical care medicine Dr. Carolin Sicks - nephrology Interventional Radiology  Reason for Admission: Tracy [name] is an 69 y.o. female who is here for abd pain that started yesterday.  It is associated with nausea.  She states she has had diarrhea for several weeks.  She has an apt with her doctor to discuss further later this month.   Procedures LAR with end colostomy, Dr. Marcello Moores 03/03/20  Laparotomy with takedown of colostomy, left colectomy, creation of new transverse end colostomy, Dr. Hassell Done 03/12/20  Creation of tracheostomy, Dr. [place] Bells 03/20/20  Placement of percutaneous abdominal drains x2, Dr. Annamaria Boots 03/20/20  Hospital Course:  Septic shock  Secondary to perforated sigmoid colon.  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  She was started on IV heparin for a concern for a PE, but ultimately this was stopped.  CCM/pulmonary team was consulted for worsening respiratory  status.  She was ultimately intubated.  She was unable to wean from the ventilator due to her baseline chronic respiratory status on acute sepsis and abdominal insult.  She ultimately had to undergotracheostomy on4/7/21 to assist with further weaning.  At time of discharge, the patient was weaning on trach collar for hours during the day and having to return to the vent at time for rest.  She will continue to work on weaning at [employer].  She does have an NGT in place with TFs at goal rate of 55cc/hr while unable to take in enteral nutrition.  AKI The patient was noted to have AKI postoperatively secondary to septic shock.  She was evaluated by nephrology but creatinine normalized with IVF hydration.  Her renal function remained stable throughout the rest of her stay.  ABL anemia  ABL anemia as expected, but stabilized with no further interventions warranted.  Malnutrition  Because of the patient's multiple abdominal operations and inability to taken in oral nutrition she was initially started on TNA.  Ultimately once her ileus resolved, she was transitioned to TFs via her NGT. These remained at goal rate of 55cc/hr at time of discharge.  RUE DVT associated with PICC  On 4/22, the patient was noted to have RUE edema.  A vascular duplex was ordered but not completed until 4/23.  This revealed a right subclavian and right axillary vein DVT associated with her PICC line.  She was being discharged, so she will need her R PICC line removed at [facility] and have a left armed PICC placed once she arrives for IV access.  She has also been started  on eliquis via her NGT which should continue for 30 days.  Perforated sigmoid colon Proximal rectal cancer with obstruction POD 34,S/p emergent Low anterior resection/ colostomy by Dr. Marcello Moores 03/03/20 POD 24,S/p exploratory laparotomy and colon resection with transverse colostomy 3/31 Dr. Hassell Done The patient was admitted and taken directly to the OR for the above first  procedure.  She had a JP drain that was placed.  Her midline wound was left open and WD dressing changes were started.  She had a NGT in placed but this was removed on POD 3.  Her diet was advanced to clear liquids, but due to worsening respiratory status, she ultimately was made NPO again.  Her WBC began increasing and her ostomy progressively became more ischemic.  She had a CT scan that revealed a post op ileus with trace ascites, but no fluid collections.  Over the next several days her stoma became more necrotic and she became increasingly septic.  A new scan was ordered 4 days later which revealed a large amount of pneumoperitoneum with concerns for ischemia near her splenic flexure.  She returned to the OR for the second procedure noted above.  The patient continued to remain on the ventilator but seemed to slowly start to improve from an abdominal standpoint with some intestinal function.  Her NGT was ultimately clamped and she was able to be started on TFs.  These were advanced towards goal rate and her TNA was weaned.  Her midline wound ultimately had some fascial separation but no evisceration.  She is getting NS WD dressing changes to this wound BID.  Due to minimal output from her stoma 6 days after her second operation, a CT scan was ordered.  This revealed two fluid collections on her left side of her abdomen.  IR was consulted and 2 drains were placed.  See below for outline of abx therapy completed during her stay.  Ultimately, the patient's abdomen stabilized out.  Her surgical drain and one of her IR drains began to have a decrease in output.  A repeat scan was ordered on 04/01/20.  These two sites showed resolution of these fluid collections so the surgical JP and IR drain #1 were removed.  Her secondary IR drain remained in place with sero-cloudy output with about 100cc/day.  This can be followed by IR once discharged to [facility].  She was ultimately started on colace and miralax for some  constipation.  She was noted to have a small amount of mucocutaneous separation of her stoma around 6-7 oclock, but no intervention was required for this.   ABX regimen: Zosyn3/21>3/29; Eraxis 3/31>>4/5,Merrem 3/29>>4/13  On POD 34/24, the patient was felt to be medically and surgically stable for DC to [facility] for further long-term acute care needs such as continuing to wean from the ventilator as well as wound care, and therapy needs.  She will need to follow up with Dr. Marcello Moores once discharged as well as her primary pulmonologist.  Physical Exam: Neck: trach in place on vent currently Abd: soft, stable midline wound with some fibrin present but with some granular tissue present as well.  Stable fascial separation of midline wound. Left sided colostomy, stoma is pink and viable, air in bag. JP drain in place still with sero-cloudy drainage present.  Ext: RUE with significantly more edema than LUE. This arm has her PICC in place  Allergies as of 04/05/2020      Reactions   Codeine Swelling  Medication List    STOP taking these medications   acetaminophen 325 MG tablet Commonly known as: TYLENOL   albuterol (2.5 MG/3ML) 0.083% nebulizer solution Commonly known as: PROVENTIL   b complex vitamins tablet   Trelegy Ellipta 100-62.5-25 MCG/INH Aepb Generic drug: Fluticasone-Umeclidin-Vilant   VITAMIN D (CHOLECALCIFEROL) PO     TAKE these medications   ALPRAZolam 0.25 MG tablet Commonly known as: XANAX Take 1 tablet (0.25 mg total) by mouth 3 (three) times daily as needed for anxiety.   apixaban 5 MG Tabs tablet Commonly known as: ELIQUIS Place 2 tablets (10 mg total) into feeding tube 2 (two) times daily.   apixaban 5 MG Tabs tablet Commonly known as: ELIQUIS Place 1 tablet (5 mg total) into feeding tube 2 (two) times daily. Start taking on: April 12, 2020   arformoterol 15 MCG/2ML Nebu Commonly known as: BROVANA Take 2 mLs (15 mcg total) by  nebulization 2 (two) times daily.   bethanechol 25 MG tablet Commonly known as: URECHOLINE Place 1 tablet (25 mg total) into feeding tube 3 (three) times daily.   budesonide 0.5 MG/2ML nebulizer solution Commonly known as: PULMICORT Take 2 mLs (0.5 mg total) by nebulization 2 (two) times daily.   chlorhexidine gluconate (MEDLINE KIT) 0.12 % solution Commonly known as: PERIDEX 15 mLs by Mouth Rinse route 2 (two) times daily.   clonazePAM 0.5 MG disintegrating tablet Commonly known as: KLONOPIN Take 1 tablet (0.5 mg total) by mouth 2 (two) times daily.   docusate 50 MG/5ML liquid Commonly known as: COLACE Place 10 mLs (100 mg total) into feeding tube 2 (two) times daily.   feeding supplement (VITAL HIGH PROTEIN) Liqd liquid Place 1,000 mLs into feeding tube continuous.   free water Soln Place 200 mLs into feeding tube every 4 (four) hours.   HYDROmorphone 1 MG/ML injection Commonly known as: DILAUDID Inject 0.5-1 mLs (0.5-1 mg total) into the vein every 2 (two) hours as needed (sedation need, use to maintain MD ordered RASS target).   insulin aspart 100 UNIT/ML injection Commonly known as: novoLOG Inject 0-15 Units into the skin every 4 (four) hours.   ipratropium-albuterol 0.5-2.5 (3) MG/3ML Soln Commonly known as: DUONEB Take 3 mLs by nebulization every 4 (four) hours as needed.   labetalol 5 MG/ML injection Commonly known as: NORMODYNE Inject 1 mL (5 mg total) into the vein every 2 (two) hours as needed (Goal SBP < 140, DBP < 105).   midazolam 2 MG/2ML Soln injection Commonly known as: VERSED Inject 0.5-1 mLs (0.5-1 mg total) into the vein every 2 (two) hours as needed (Sedation needs, use to maintain MD ordered RASS target).   mouth rinse Liqd solution 15 mLs by Mouth Rinse route daily.   ondansetron 4 MG/2ML Soln injection Commonly known as: ZOFRAN Inject 2 mLs (4 mg total) into the vein every 6 (six) hours as needed for nausea or vomiting.   oxyCODONE 5  MG/5ML solution Commonly known as: ROXICODONE Take 5 mLs (5 mg total) by mouth every 6 (six) hours as needed for moderate pain.   pantoprazole 40 MG injection Commonly known as: PROTONIX Inject 40 mg into the vein daily. Start taking on: April 06, 2020   polyethylene glycol 17 g packet Commonly known as: MIRALAX / GLYCOLAX Place 17 g into feeding tube daily. Start taking on: April 06, 2020   sodium chloride 0.9 % infusion Inject 250 mLs into the vein as needed (for IV line care(Saline / Heparin Lock)).   sodium chloride flush  0.9 % Soln Commonly known as: NS Inject 3 mLs into the vein every 12 (twelve) hours.   sodium chloride flush 0.9 % Soln Commonly known as: NS 10-40 mLs by Intracatheter route every 12 (twelve) hours.        Follow-up Information    [name], MD Follow up in 1 week(s).   Specialty: General Surgery Why: after discharge from [facility] Contact information: [address] [place] [address] [phone]        June Leap L, DO Follow up in 1 week(s).   Specialty: Pulmonary Disease Why: after discharge from [facility] information: [address] [place] [address] [phone]           Signed: Saverio Danker, [facility] 04/05/2020, 4:27 PM Please see Amion for pager number during day hours 7:00am-4:30pm, 7-11:30am on Weekends

## 2020-04-06 ENCOUNTER — Encounter: Payer: Self-pay | Admitting: Internal Medicine

## 2020-04-06 DIAGNOSIS — Z930 Tracheostomy status: Secondary | ICD-10-CM | POA: Insufficient documentation

## 2020-04-06 DIAGNOSIS — J9621 Acute and chronic respiratory failure with hypoxia: Secondary | ICD-10-CM | POA: Diagnosis present

## 2020-04-06 DIAGNOSIS — I82621 Acute embolism and thrombosis of deep veins of right upper extremity: Secondary | ICD-10-CM | POA: Diagnosis present

## 2020-04-06 DIAGNOSIS — K631 Perforation of intestine (nontraumatic): Secondary | ICD-10-CM | POA: Diagnosis present

## 2020-04-06 DIAGNOSIS — J449 Chronic obstructive pulmonary disease, unspecified: Secondary | ICD-10-CM | POA: Diagnosis present

## 2020-04-06 LAB — COMPREHENSIVE METABOLIC PANEL
ALT: 41 U/L (ref 0–44)
AST: 32 U/L (ref 15–41)
Albumin: 2.2 g/dL — ABNORMAL LOW (ref 3.5–5.0)
Alkaline Phosphatase: 176 U/L — ABNORMAL HIGH (ref 38–126)
Anion gap: 10 (ref 5–15)
BUN: 20 mg/dL (ref 8–23)
CO2: 29 mmol/L (ref 22–32)
Calcium: 8.9 mg/dL (ref 8.9–10.3)
Chloride: 95 mmol/L — ABNORMAL LOW (ref 98–111)
Creatinine, Ser: 0.49 mg/dL (ref 0.44–1.00)
GFR calc Af Amer: >60 mL/min (ref >60)
GFR calc non Af Amer: >60 mL/min (ref >60)
Glucose, Bld: 119 mg/dL — ABNORMAL HIGH (ref 70–99)
Potassium: 4.2 mmol/L (ref 3.5–5.1)
Sodium: 134 mmol/L — ABNORMAL LOW (ref 135–145)
Total Bilirubin: 0.2 mg/dL — ABNORMAL LOW (ref 0.3–1.2)
Total Protein: 5.6 g/dL — ABNORMAL LOW (ref 6.5–8.1)

## 2020-04-06 LAB — CBC
HCT: 29 % — ABNORMAL LOW (ref 36.0–46.0)
Hemoglobin: 9.1 g/dL — ABNORMAL LOW (ref 12.0–15.0)
MCH: 29.8 pg (ref 26.0–34.0)
MCHC: 31.4 g/dL (ref 30.0–36.0)
MCV: 95.1 fL (ref 80.0–100.0)
Platelets: 711 10*3/uL — ABNORMAL HIGH (ref 150–400)
RBC: 3.05 MIL/uL — ABNORMAL LOW (ref 3.87–5.11)
RDW: 15.7 % — ABNORMAL HIGH (ref 11.5–15.5)
WBC: 10.9 10*3/uL — ABNORMAL HIGH (ref 4.0–10.5)
nRBC: 0 % (ref 0.0–0.2)

## 2020-04-06 LAB — BLOOD GAS, ARTERIAL
Acid-Base Excess: 4.2 mmol/L — ABNORMAL HIGH (ref 0.0–2.0)
Bicarbonate: 28 mmol/L (ref 20.0–28.0)
FIO2: 30
O2 Saturation: 95.8 %
Patient temperature: 37
pCO2 arterial: 40.2 mmHg (ref 32.0–48.0)
pH, Arterial: 7.457 — ABNORMAL HIGH (ref 7.350–7.450)
pO2, Arterial: 74.2 mmHg — ABNORMAL LOW (ref 83.0–108.0)

## 2020-04-06 LAB — MAGNESIUM: Magnesium: 1.6 mg/dL — ABNORMAL LOW (ref 1.7–2.4)

## 2020-04-06 NOTE — Consult Note (Signed)
Pulmonary [place]  Date of Service: 04/06/2020  PULMONARY CRITICAL CARE CONSULT   Tracy [name]  [id number]  DOB: 02-07-1951   DOA: 04/05/2020  Referring Physician: Merton Border, MD  HPI: Tracy [name] is a 69 y.o. female seen for follow up of Acute on Chronic Respiratory Failure.  Patient has multiple medical problems including COPD chronic hemorrhoids allergies vitamin deficiency sepsis shock presented to the hospital because of abdominal pain.  Patient has been having nausea along with this along with this diarrhea.  Patient was evaluated and was found to have perforated sigmoid colon.  Patient underwent an exploratory laparotomy and resection of the colon.  The patient emergent lower anterior resection and colostomy done on March 21.  Subsequently was not able to come off the ventilator.  And had to have a tracheostomy done.  Other complications included development of acute renal failure anemia with blood loss DVT associated with PICC line  Review of Systems:  ROS performed and is unremarkable other than noted above.  Past Medical History:  Diagnosis Date  . Asthma   . COPD (chronic obstructive pulmonary disease) ([place]) 2011   FeV1 31% predicted FeV1/FVX 47 %  . Hemorrhoid   . Osteopenia   . Seasonal allergies    takes Claritin daily prn  . Vitamin D deficiency    takes Vit d every 14 days    Past Surgical History:  Procedure Laterality Date  . AUGMENTATION MAMMAPLASTY     saline  . BREAST ENHANCEMENT SURGERY    . EXAMINATION UNDER ANESTHESIA  10/14/2012   Procedure: EXAM UNDER ANESTHESIA;  Surgeon: Gayland Curry, MD,FACS;  Location: [facility];  Service: General;  Laterality: N/A;  rectal exam under anesthesia excisional hemorrhoidectomy hemorrhoidal banding x two  . EXAMINATION UNDER ANESTHESIA  02/03/2013   excision hemorrhoidal tissue  . FOOT SURGERY     left bunionectomy  . HEMORRHOIDECTOMY WITH  HEMORRHOID BANDING  10/14/2012   Procedure: [id number] WITH HEMORRHOID BANDING;  Surgeon: Gayland Curry, MD,FACS;  Location: [place];  Service: General;  Laterality: N/A;  . LAPAROTOMY N/A 03/03/2020   Procedure: low anterior resection end colostomy;  Surgeon: [name], MD;  Location: [facility];  Service: General;  Laterality: N/A;  . LAPAROTOMY N/A 03/12/2020   Procedure: EXPLORATORY LAPAROTOMY WITH BOWEL RESECTION AND COLOSTOMY;  Surgeon: [name], MD;  Location: [facility];  Service: General;  Laterality: N/A;  . OPEN REDUCTION INTERNAL FIXATION (ORIF) DISTAL RADIAL FRACTURE Right 11/01/2018   Procedure: OPEN REDUCTION INTERNAL FIXATION (ORIF) DISTAL RADIAL FRACTURE;  Surgeon: [name], MD;  Location: [place];  Service: Orthopedics;  Laterality: Right;  . TONSILLECTOMY  age 64    recurrent otitis media    Social History:    reports that she quit smoking about 20 years ago. Her smoking use included cigarettes. She has a 10.00 pack-year smoking history. She has never used smokeless tobacco. She reports current alcohol use. She reports that she does not use drugs.  Family History: Non-Contributory to the present illness  Allergies  Allergen Reactions  . Codeine Swelling    Medications: Reviewed on Rounds  Physical Exam:  Vitals: Temperature 97.7 pulse 109 respiratory 22 blood pressure is 162/82 saturations 96%  Ventilator Settings off the ventilator right now on T collar FiO2 28%  . General: Comfortable at this time . Eyes: Grossly normal lids, irises & conjunctiva . ENT: grossly tongue is normal . Neck: no  obvious mass . Cardiovascular: S1-S2 normal no gallop or rub . Respiratory: No rhonchi no rales are noted at this time . Abdomen: Soft and nontender . Skin: no rash seen on limited exam . Musculoskeletal: not rigid . Psychiatric:unable to assess . Neurologic: no seizure no involuntary movements         Labs on Admission:  Basic Metabolic  Panel: Recent Labs  Lab 03/31/20 0410 04/01/20 0319 04/02/20 0613 04/04/20 0418 04/06/20 0251  NA 139 138 137 138 134*  K 3.8 3.1* 4.0 3.7 4.2  CL 104 99 100 101 95*  CO2 29 28 29 29 29   GLUCOSE 128* 145* 140* 125* 119*  BUN 36* 41* 38* 28* 20  CREATININE 0.38* 0.39* 0.38* 0.32* 0.49  CALCIUM 8.3* 8.2* 8.0* 8.4* 8.9  MG  --  1.6*  --  1.8 1.6*  PHOS  --  4.2  --  3.9  --     Recent Labs  Lab 04/06/20 0055  PHART 7.457*  PCO2ART 40.2  PO2ART 74.2*  HCO3 28.0  O2SAT 95.8    Liver Function Tests: Recent Labs  Lab 04/01/20 0319 04/04/20 0418 04/06/20 0251  AST 35 29 32  ALT 50* 39 41  ALKPHOS 251* 186* 176*  BILITOT 0.6 0.4 0.2*  PROT 5.5* 5.2* 5.6*  ALBUMIN 2.0* 2.0* 2.2*   No results for input(s): LIPASE, AMYLASE in the last 168 hours. No results for input(s): AMMONIA in the last 168 hours.  CBC: Recent Labs  Lab 04/01/20 0319 04/02/20 0613 04/06/20 0251  WBC 8.2 7.8 10.9*  NEUTROABS 5.5  --   --   HGB 8.3* 7.8* 9.1*  HCT 27.0* 25.3* 29.0*  MCV 97.1 97.7 95.1  PLT 454* 516* 711*    Cardiac Enzymes: No results for input(s): CKTOTAL, CKMB, CKMBINDEX, TROPONINI in the last 168 hours.  BNP (last 3 results) No results for input(s): BNP in the last 8760 hours.  ProBNP (last 3 results) No results for input(s): PROBNP in the last 8760 hours.   Radiological Exams on Admission: DG CHEST PORT 1 VIEW  Result Date: 04/05/2020 CLINICAL DATA:  Tracheostomy tube placement. EXAM: PORTABLE CHEST 1 VIEW COMPARISON:  April 03, 2020 FINDINGS: There is stable tracheostomy tube, nasogastric tube and right-sided PICC line positioning. The heart size and mediastinal contours are within normal limits. 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 The visualized skeletal structures are unremarkable. IMPRESSION: No significant interval change when compared to the prior  chest plain film dated April 03, 2020. Electronically Signed   By: Virgina Norfolk M.D.   On: 04/05/2020 23:17   DG CHEST PORT 1 VIEW  Result Date: 04/03/2020 CLINICAL DATA:  Respiratory failure EXAM: PORTABLE CHEST 1 VIEW COMPARISON:  03/25/2020 FINDINGS: The tracheostomy tube, NG tube and right PICC lines are stable. Underlying emphysematous changes and upper lobe scarring. No infiltrates or effusions. No pneumothorax. IMPRESSION: Stable support apparatus. Emphysematous changes and upper lobe pulmonary scarring without definite infiltrates or effusions. Electronically Signed   By: Marijo Sanes M.D.   On: 04/03/2020 07:06   DG Abd Portable 1V  Result Date: 04/05/2020 CLINICAL DATA:  Nasogastric tube placement. EXAM: PORTABLE ABDOMEN - 1 VIEW COMPARISON:  None. FINDINGS: Nasogastric tube is seen with its distal tip overlying the body of the stomach. The bowel gas pattern is normal. No radio-opaque calculi or other significant radiographic abnormality are seen. A single left-sided percutaneous nephrostomy tube is  present. IMPRESSION: Nasogastric tube positioning, as described above. Electronically Signed   By: Virgina Norfolk M.D.   On: 04/05/2020 23:15   VAS [name] UPPER EXTREMITY VENOUS DUPLEX  Result Date: 04/05/2020 UPPER VENOUS STUDY  Indications: Swelling, and PICC Comparison Study: no prior Performing Technologist: June Leap RDMS, RVT  Examination Guidelines: A complete evaluation includes B-mode imaging, spectral Doppler, color Doppler, and power Doppler as needed of all accessible portions of each vessel. Bilateral testing is considered an integral part of a complete examination. Limited examinations for reoccurring indications may be performed as noted.  Right Findings: +----------+------------+---------+-----------+----------+---------------------+ RIGHT     CompressiblePhasicitySpontaneousProperties       Summary         +----------+------------+---------+-----------+----------+---------------------+ IJV                                                   not imaged due to                                                        bandages/ hardware   +----------+------------+---------+-----------+----------+---------------------+ Subclavian  Partial      Yes       Yes                                    +----------+------------+---------+-----------+----------+---------------------+ Axillary      None       No        No                                     +----------+------------+---------+-----------+----------+---------------------+ Brachial                                            not visualized due to                                                         PICC bandages     +----------+------------+---------+-----------+----------+---------------------+ Radial        Full                                                        +----------+------------+---------+-----------+----------+---------------------+ Ulnar         Full                                                        +----------+------------+---------+-----------+----------+---------------------+ Cephalic      Full                                                        +----------+------------+---------+-----------+----------+---------------------+  Basilic                                             not visualized due to                                                         PICC bandages     +----------+------------+---------+-----------+----------+---------------------+  Left Findings: +----------+------------+---------+-----------+----------+-------+ LEFT      CompressiblePhasicitySpontaneousPropertiesSummary +----------+------------+---------+-----------+----------+-------+ Subclavian               Yes       Yes                       +----------+------------+---------+-----------+----------+-------+  Summary:  Right: Findings consistent with acute deep vein thrombosis involving the right subclavian vein and right axillary vein.  Left: No evidence of thrombosis in the subclavian.  *See table(s) above for measurements and observations.  Diagnosing physician: Harold Barban MD Electronically signed by Harold Barban MD on 04/05/2020 at 5:08:13 PM.    Final     Assessment/Plan Active Problems:   Acute on chronic respiratory failure with hypoxia (H[facility])   COPD, severe ([place])   Perforation of sigmoid colon ([place])   Acute deep vein thrombosis (DVT) of brachial vein of right upper extremity ([place])   Tracheostomy status ([place])   1. Acute on chronic respiratory failure hypoxia patient currently is on T collar trials right now requiring 28% FiO2 doing well.  Plan is going to be to continue with T collar as tolerated.  Secretions are reportedly moderate. 2. Severe COPD patient will continue with medical management.  Patient has been on inhaled corticosteroids at the other facility. 3. Bowel perforation status post colectomy and plan is to continue with supportive care enteral nutrition 4. DVT upper extremity on the right side secondary to a PICC line.  Patient was started on Eliquis we will continue to monitor. 5. Tracheostomy status working towards eventual weaning and decannulation we will continue to monitor.  I have personally seen and evaluated the patient, evaluated laboratory and imaging results, formulated the assessment and plan and placed orders. The Patient requires high complexity decision making with multiple systems involvement.  Case was discussed on Rounds with the Respiratory Therapy Director and the Respiratory staff Time Spent 32minutes  Waverly Tarquinio A Milton Streicher, MD [facility] Pulmonary Critical Care Medicine Sleep Medicine

## 2020-04-07 NOTE — Progress Notes (Signed)
Pulmonary Critical Care Medicine [place]   PULMONARY CRITICAL CARE SERVICE  PROGRESS NOTE  Date of Service: 04/07/2020  Tracy [name]  [id number]  DOB: 1951/10/17   DOA: 04/05/2020  Referring Physician: Merton Border, MD  HPI: Tracy [name] is a 69 y.o. female seen for follow up of Acute on Chronic Respiratory Failure.  Patient currently is on assist control has been on 30% FiO2 off the ventilator for at least 5 hours had some difficulty tolerating it today we will reassess and try again on T collar.  She does have a lot of issues with anxiety  Medications: Reviewed on Rounds  Physical Exam:  Vitals: Temperature 98.1 pulse 87 respiratory rate 20 blood pressure is 140/82 saturations 99%  Ventilator Settings on assist control FiO2 30% tidal volume 456 PEEP 5  . General: Comfortable at this time . Eyes: Grossly normal lids, irises & conjunctiva . ENT: grossly tongue is normal . Neck: no obvious mass . Cardiovascular: S1 S2 normal no gallop . Respiratory: No rhonchi coarse breath sounds . Abdomen: soft . Skin: no rash seen on limited exam . Musculoskeletal: not rigid . Psychiatric:unable to assess . Neurologic: no seizure no involuntary movements         Lab Data:   Basic Metabolic Panel: Recent Labs  Lab 04/01/20 0319 04/02/20 0613 04/04/20 0418 04/06/20 0251  NA 138 137 138 134*  K 3.1* 4.0 3.7 4.2  CL 99 100 101 95*  CO2 28 29 29 29   GLUCOSE 145* 140* 125* 119*  BUN 41* 38* 28* 20  CREATININE 0.39* 0.38* 0.32* 0.49  CALCIUM 8.2* 8.0* 8.4* 8.9  MG 1.6*  --  1.8 1.6*  PHOS 4.2  --  3.9  --     ABG: Recent Labs  Lab 04/06/20 0055  PHART 7.457*  PCO2ART 40.2  PO2ART 74.2*  HCO3 28.0  O2SAT 95.8    Liver Function Tests: Recent Labs  Lab 04/01/20 0319 04/04/20 0418 04/06/20 0251  AST 35 29 32  ALT 50* 39 41  ALKPHOS 251* 186* 176*  BILITOT 0.6 0.4 0.2*  PROT 5.5* 5.2* 5.6*  ALBUMIN 2.0* 2.0* 2.2*   No results for  input(s): LIPASE, AMYLASE in the last 168 hours. No results for input(s): AMMONIA in the last 168 hours.  CBC: Recent Labs  Lab 04/01/20 0319 04/02/20 0613 04/06/20 0251  WBC 8.2 7.8 10.9*  NEUTROABS 5.5  --   --   HGB 8.3* 7.8* 9.1*  HCT 27.0* 25.3* 29.0*  MCV 97.1 97.7 95.1  PLT 454* 516* 711*    Cardiac Enzymes: No results for input(s): CKTOTAL, CKMB, CKMBINDEX, TROPONINI in the last 168 hours.  BNP (last 3 results) No results for input(s): BNP in the last 8760 hours.  ProBNP (last 3 results) No results for input(s): PROBNP in the last 8760 hours.  Radiological Exams: DG CHEST PORT 1 VIEW  Result Date: 04/05/2020 CLINICAL DATA:  Tracheostomy tube placement. EXAM: PORTABLE CHEST 1 VIEW COMPARISON:  April 03, 2020 FINDINGS: There is stable tracheostomy tube, nasogastric tube and right-sided PICC line positioning. The heart size and mediastinal contours are within normal limits. 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 The visualized skeletal structures are unremarkable. IMPRESSION: No significant interval change when compared to the prior chest plain film dated April 03, 2020. Electronically Signed   By: Virgina Norfolk M.D.   On: 04/05/2020 23:17  DG Abd Portable 1V  Result Date: 04/05/2020 CLINICAL DATA:  Nasogastric tube placement. EXAM: PORTABLE ABDOMEN - 1 VIEW COMPARISON:  None. FINDINGS: Nasogastric tube is seen with its distal tip overlying the body of the stomach. The bowel gas pattern is normal. No radio-opaque calculi or other significant radiographic abnormality are seen. A single left-sided percutaneous nephrostomy tube is present. IMPRESSION: Nasogastric tube positioning, as described above. Electronically Signed   By: Virgina Norfolk M.D.   On: 04/05/2020 23:15   VAS [name] UPPER EXTREMITY VENOUS DUPLEX  Result Date: 04/05/2020 UPPER VENOUS STUDY   Indications: Swelling, and PICC Comparison Study: no prior Performing Technologist: June Leap RDMS, RVT  Examination Guidelines: A complete evaluation includes B-mode imaging, spectral Doppler, color Doppler, and power Doppler as needed of all accessible portions of each vessel. Bilateral testing is considered an integral part of a complete examination. Limited examinations for reoccurring indications may be performed as noted.  Right Findings: +----------+------------+---------+-----------+----------+---------------------+ RIGHT     CompressiblePhasicitySpontaneousProperties       Summary        +----------+------------+---------+-----------+----------+---------------------+ IJV                                                   not imaged due to                                                        bandages/ hardware   +----------+------------+---------+-----------+----------+---------------------+ Subclavian  Partial      Yes       Yes                                    +----------+------------+---------+-----------+----------+---------------------+ Axillary      None       No        No                                     +----------+------------+---------+-----------+----------+---------------------+ Brachial                                            not visualized due to                                                         PICC bandages     +----------+------------+---------+-----------+----------+---------------------+ Radial        Full                                                        +----------+------------+---------+-----------+----------+---------------------+ Ulnar         Full                                                        +----------+------------+---------+-----------+----------+---------------------+  Cephalic      Full                                                         +----------+------------+---------+-----------+----------+---------------------+ Basilic                                             not visualized due to                                                         PICC bandages     +----------+------------+---------+-----------+----------+---------------------+  Left Findings: +----------+------------+---------+-----------+----------+-------+ LEFT      CompressiblePhasicitySpontaneousPropertiesSummary +----------+------------+---------+-----------+----------+-------+ Subclavian               Yes       Yes                      +----------+------------+---------+-----------+----------+-------+  Summary:  Right: Findings consistent with acute deep vein thrombosis involving the right subclavian vein and right axillary vein.  Left: No evidence of thrombosis in the subclavian.  *See table(s) above for measurements and observations.  Diagnosing physician: Harold Barban MD Electronically signed by Harold Barban MD on 04/05/2020 at 5:08:13 PM.    Final     Assessment/Plan Active Problems:   Acute on chronic respiratory failure with hypoxia (HCC)   COPD, severe ([place])   Perforation of sigmoid colon ([place])   Acute deep vein thrombosis (DVT) of brachial vein of right upper extremity ([place])   Tracheostomy status ([place])   1. Acute on chronic respiratory failure with hypoxia we will continue with weaning attempts on T collar again today.  It may be helpful to get anxiety under better control 2. Severe COPD medical management continue present management supportive care 3. Perforated sigmoid colon status post resection we will continue to monitor 4. Acute DVT treated with anticoagulation 5. Tracheostomy remains   I have personally seen and evaluated the patient, evaluated laboratory and imaging results, formulated the assessment and plan and placed orders. The Patient requires high complexity decision making with multiple systems involvement.   Rounds were done with the Respiratory Therapy Director and Staff therapists and discussed with nursing staff also.  Allyne Gee, MD [facility] Pulmonary Critical Care Medicine Sleep Medicine

## 2020-04-07 NOTE — Progress Notes (Signed)
Referring Physician(s): Byerly,F  Supervising Physician: Jacqulynn Cadet  Patient Status:  [facility]  Chief Complaint:  Left abdominal fluid collection  Subjective: Pt has now been transferred to [facility](previously adm to [facility]); remains on vent via trach; has 1 remaining abd drain in place on left(previously 2 on left and 1 surg drain on right); has new RUE DVT; otherwise no acute changes; husband in room   Allergies: Codeine  Medications: Prior to Admission medications   Medication Sig Start Date End Date Taking? Authorizing Provider  ALPRAZolam (XANAX) 0.25 MG tablet Take 1 tablet (0.25 mg total) by mouth 3 (three) times daily as needed for anxiety. 04/05/20   Saverio Danker, PA-C  apixaban (ELIQUIS) 5 MG TABS tablet Place 2 tablets (10 mg total) into feeding tube 2 (two) times daily. 04/05/20   Saverio Danker, PA-C  apixaban (ELIQUIS) 5 MG TABS tablet Place 1 tablet (5 mg total) into feeding tube 2 (two) times daily. 04/12/20   Saverio Danker, PA-C  arformoterol (BROVANA) 15 MCG/2ML NEBU Take 2 mLs (15 mcg total) by nebulization 2 (two) times daily. 04/05/20   Saverio Danker, PA-C  bethanechol (URECHOLINE) 25 MG tablet Place 1 tablet (25 mg total) into feeding tube 3 (three) times daily. 04/05/20   Saverio Danker, PA-C  budesonide (PULMICORT) 0.5 MG/2ML nebulizer solution Take 2 mLs (0.5 mg total) by nebulization 2 (two) times daily. 04/05/20   Saverio Danker, PA-C  chlorhexidine gluconate, MEDLINE KIT, (PERIDEX) 0.12 % solution 15 mLs by Mouth Rinse route 2 (two) times daily. 04/05/20   Saverio Danker, PA-C  clonazePAM (KLONOPIN) 0.5 MG disintegrating tablet Take 1 tablet (0.5 mg total) by mouth 2 (two) times daily. 04/05/20   Saverio Danker, PA-C  docusate (COLACE) 50 MG/5ML liquid Place 10 mLs (100 mg total) into feeding tube 2 (two) times daily. 04/05/20   Saverio Danker, PA-C  HYDROmorphone (DILAUDID) 1 MG/ML injection Inject 0.5-1 mLs (0.5-1 mg total) into the vein every 2 (two) hours as  needed (sedation need, use to maintain MD ordered RASS target). 04/05/20   Saverio Danker, PA-C  insulin aspart (NOVOLOG) 100 UNIT/ML injection Inject 0-15 Units into the skin every 4 (four) hours. 04/05/20   Saverio Danker, PA-C  ipratropium-albuterol (DUONEB) 0.5-2.5 (3) MG/3ML SOLN Take 3 mLs by nebulization every 4 (four) hours as needed. 04/05/20   Saverio Danker, PA-C  labetalol (NORMODYNE) 5 MG/ML injection Inject 1 mL (5 mg total) into the vein every 2 (two) hours as needed (Goal SBP < 140, DBP < 105). 04/05/20   Saverio Danker, PA-C  midazolam (VERSED) 2 MG/2ML SOLN injection Inject 0.5-1 mLs (0.5-1 mg total) into the vein every 2 (two) hours as needed (Sedation needs, use to maintain MD ordered RASS target). 04/05/20   Saverio Danker, PA-C  mouth rinse LIQD solution 15 mLs by Mouth Rinse route daily. 04/05/20   Saverio Danker, PA-C  Nutritional Supplements (FEEDING SUPPLEMENT, VITAL HIGH PROTEIN,) LIQD liquid Place 1,000 mLs into feeding tube continuous. 04/05/20   Saverio Danker, PA-C  ondansetron [facility]) 4 MG/2ML SOLN injection Inject 2 mLs (4 mg total) into the vein every 6 (six) hours as needed for nausea or vomiting. 04/05/20   Saverio Danker, PA-C  oxyCODONE (ROXICODONE) 5 MG/5ML solution Take 5 mLs (5 mg total) by mouth every 6 (six) hours as needed for moderate pain. 04/05/20   Saverio Danker, PA-C  pantoprazole (PROTONIX) 40 MG injection Inject 40 mg into the vein daily. 04/06/20   Saverio Danker, PA-C  polyethylene glycol [facility] /  GLYCOLAX) 17 g packet Place 17 g into feeding tube daily. 04/06/20   Saverio Danker, PA-C  sodium chloride 0.9 % infusion Inject 250 mLs into the vein as needed (for IV line care(Saline / Heparin Lock)). 04/05/20   Saverio Danker, PA-C  sodium chloride flush (NS) 0.9 % SOLN Inject 3 mLs into the vein every 12 (twelve) hours. 04/05/20   Saverio Danker, PA-C  sodium chloride flush (NS) 0.9 % SOLN 10-40 mLs by Intracatheter route every 12 (twelve) hours. 04/05/20    Saverio Danker, PA-C  Water For Irrigation, Sterile (FREE WATER) SOLN Place 200 mLs into feeding tube every 4 (four) hours. 04/05/20   Saverio Danker, PA-C     Vital Signs: LMP 12/14/2000   Physical Exam awake, alert; left abdominal drain intact, insertion site okay, approximately 15 cc of turbid light yellow fluid with debris  in JP bulb; drain flushed with no significant return  Imaging: DG CHEST PORT 1 VIEW  Result Date: 04/05/2020 CLINICAL DATA:  Tracheostomy tube placement. EXAM: PORTABLE CHEST 1 VIEW COMPARISON:  April 03, 2020 FINDINGS: There is stable tracheostomy tube, nasogastric tube and right-sided PICC line positioning. The heart size and mediastinal contours are within normal limits. 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 The visualized skeletal structures are unremarkable. IMPRESSION: No significant interval change when compared to the prior chest plain film dated April 03, 2020. Electronically Signed   By: Virgina Norfolk M.D.   On: 04/05/2020 23:17   DG Abd Portable 1V  Result Date: 04/05/2020 CLINICAL DATA:  Nasogastric tube placement. EXAM: PORTABLE ABDOMEN - 1 VIEW COMPARISON:  None. FINDINGS: Nasogastric tube is seen with its distal tip overlying the body of the stomach. The bowel gas pattern is normal. No radio-opaque calculi or other significant radiographic abnormality are seen. A single left-sided percutaneous nephrostomy tube is present. IMPRESSION: Nasogastric tube positioning, as described above. Electronically Signed   By: Virgina Norfolk M.D.   On: 04/05/2020 23:15   VAS [name] UPPER EXTREMITY VENOUS DUPLEX  Result Date: 04/05/2020 UPPER VENOUS STUDY  Indications: Swelling, and PICC Comparison Study: no prior Performing Technologist: June Leap RDMS, RVT  Examination Guidelines: A complete evaluation includes B-mode imaging, spectral Doppler, color Doppler,  and power Doppler as needed of all accessible portions of each vessel. Bilateral testing is considered an integral part of a complete examination. Limited examinations for reoccurring indications may be performed as noted.  Right Findings: +----------+------------+---------+-----------+----------+---------------------+ RIGHT     CompressiblePhasicitySpontaneousProperties       Summary        +----------+------------+---------+-----------+----------+---------------------+ IJV                                                   not imaged due to                                                        bandages/ hardware   +----------+------------+---------+-----------+----------+---------------------+ Subclavian  Partial      Yes       Yes                                    +----------+------------+---------+-----------+----------+---------------------+  Axillary      None       No        No                                     +----------+------------+---------+-----------+----------+---------------------+ Brachial                                            not visualized due to                                                         PICC bandages     +----------+------------+---------+-----------+----------+---------------------+ Radial        Full                                                        +----------+------------+---------+-----------+----------+---------------------+ Ulnar         Full                                                        +----------+------------+---------+-----------+----------+---------------------+ Cephalic      Full                                                        +----------+------------+---------+-----------+----------+---------------------+ Basilic                                             not visualized due to                                                         PICC bandages      +----------+------------+---------+-----------+----------+---------------------+  Left Findings: +----------+------------+---------+-----------+----------+-------+ LEFT      CompressiblePhasicitySpontaneousPropertiesSummary +----------+------------+---------+-----------+----------+-------+ Subclavian               Yes       Yes                      +----------+------------+---------+-----------+----------+-------+  Summary:  Right: Findings consistent with acute deep vein thrombosis involving the right subclavian vein and right axillary vein.  Left: No evidence of thrombosis in the subclavian.  *See table(s) above for measurements and observations.  Diagnosing physician: Harold Barban MD Electronically signed by Harold Barban MD on 04/05/2020 at 5:08:13 PM.    Final     Labs:  CBC: Recent  Labs    03/30/20 0313 04/01/20 0319 04/02/20 0613 04/06/20 0251  WBC 6.4 8.2 7.8 10.9*  HGB 7.6* 8.3* 7.8* 9.1*  HCT 24.9* 27.0* 25.3* 29.0*  PLT 332 454* 516* 711*    COAGS: Recent Labs    03/09/20 1752  INR 1.3*    BMP: Recent Labs    04/01/20 0319 04/02/20 0613 04/04/20 0418 04/06/20 0251  NA 138 137 138 134*  K 3.1* 4.0 3.7 4.2  CL 99 100 101 95*  CO2 28 29 29 29  GLUCOSE 145* 140* 125* 119*  BUN 41* 38* 28* 20  CALCIUM 8.2* 8.0* 8.4* 8.9  CREATININE 0.39* 0.38* 0.32* 0.49  GFRNONAA >60 >60 >60 >60  GFRAA >60 >60 >60 >60    LIVER FUNCTION TESTS: Recent Labs    03/28/20 0551 04/01/20 0319 04/04/20 0418 04/06/20 0251  BILITOT 0.7 0.6 0.4 0.2*  AST 38 35 29 32  ALT 67* 50* 39 41  ALKPHOS 274* 251* 186* 176*  PROT 5.1* 5.5* 5.2* 5.6*  ALBUMIN 2.0* 2.0* 2.0* 2.2*    Assessment and Plan: Patient with history of perforated sigmoid colon, proximal rectal cancer with prior LAR/colon resection/colostomy; post opleft abdominal fluid collections, status post drain placement x2 on 4/7; WBC 10.9, hgb 9.1, creat nl;  now with only one left-sided drain in place; continue  to irrigate drain once daily/monitor output and once minimal consider drain removal.   Electronically Signed: D. Kevin , PA-C 04/07/2020, 12:37 PM    I spent a total of 15 minutes at the the patient's bedside AND on the patient's hospital floor or unit, greater than 50% of which was counseling/coordinating care for left abdominal fluid collection drain   Patient ID: Tracy [name], female   DOB: 09/28/1951, 69 y.o.   MRN: [id number]  

## 2020-04-08 ENCOUNTER — Other Ambulatory Visit (HOSPITAL_COMMUNITY): Payer: Medicare HMO

## 2020-04-08 ENCOUNTER — Ambulatory Visit: Payer: Medicare HMO | Admitting: Adult Health

## 2020-04-08 LAB — BRAIN NATRIURETIC PEPTIDE: B Natriuretic Peptide: 42.9 pg/mL (ref 0.0–100.0)

## 2020-04-08 NOTE — Progress Notes (Signed)
Referring Physician(s): Dr Laren Everts  Supervising Physician: Sandi Mariscal  Patient Status:  Select IP  Chief Complaint:  Left abscess drain Placed in IR 03/20/20 (perf sigmoid colon; rectal Ca)  Subjective:  L abd abscess drain in place OP ~50 cc daily Cloudy yellow  Flushes easily  Allergies: Codeine  Medications: Prior to Admission medications   Medication Sig Start Date End Date Taking? Authorizing Provider  ALPRAZolam (XANAX) 0.25 MG tablet Take 1 tablet (0.25 mg total) by mouth 3 (three) times daily as needed for anxiety. 04/05/20   Saverio Danker, PA-C  apixaban (ELIQUIS) 5 MG TABS tablet Place 2 tablets (10 mg total) into feeding tube 2 (two) times daily. 04/05/20   Saverio Danker, PA-C  apixaban (ELIQUIS) 5 MG TABS tablet Place 1 tablet (5 mg total) into feeding tube 2 (two) times daily. 04/12/20   Saverio Danker, PA-C  arformoterol (BROVANA) 15 MCG/2ML NEBU Take 2 mLs (15 mcg total) by nebulization 2 (two) times daily. 04/05/20   Saverio Danker, PA-C  bethanechol (URECHOLINE) 25 MG tablet Place 1 tablet (25 mg total) into feeding tube 3 (three) times daily. 04/05/20   Saverio Danker, PA-C  budesonide (PULMICORT) 0.5 MG/2ML nebulizer solution Take 2 mLs (0.5 mg total) by nebulization 2 (two) times daily. 04/05/20   Saverio Danker, PA-C  chlorhexidine gluconate, MEDLINE KIT, (PERIDEX) 0.12 % solution 15 mLs by Mouth Rinse route 2 (two) times daily. 04/05/20   Saverio Danker, PA-C  clonazePAM (KLONOPIN) 0.5 MG disintegrating tablet Take 1 tablet (0.5 mg total) by mouth 2 (two) times daily. 04/05/20   Saverio Danker, PA-C  docusate (COLACE) 50 MG/5ML liquid Place 10 mLs (100 mg total) into feeding tube 2 (two) times daily. 04/05/20   Saverio Danker, PA-C  HYDROmorphone (DILAUDID) 1 MG/ML injection Inject 0.5-1 mLs (0.5-1 mg total) into the vein every 2 (two) hours as needed (sedation need, use to maintain MD ordered RASS target). 04/05/20   Saverio Danker, PA-C  insulin aspart  (NOVOLOG) 100 UNIT/ML injection Inject 0-15 Units into the skin every 4 (four) hours. 04/05/20   Saverio Danker, PA-C  ipratropium-albuterol (DUONEB) 0.5-2.5 (3) MG/3ML SOLN Take 3 mLs by nebulization every 4 (four) hours as needed. 04/05/20   Saverio Danker, PA-C  labetalol (NORMODYNE) 5 MG/ML injection Inject 1 mL (5 mg total) into the vein every 2 (two) hours as needed (Goal SBP < 140, DBP < 105). 04/05/20   Saverio Danker, PA-C  midazolam (VERSED) 2 MG/2ML SOLN injection Inject 0.5-1 mLs (0.5-1 mg total) into the vein every 2 (two) hours as needed (Sedation needs, use to maintain MD ordered RASS target). 04/05/20   Saverio Danker, PA-C  mouth rinse LIQD solution 15 mLs by Mouth Rinse route daily. 04/05/20   Saverio Danker, PA-C  Nutritional Supplements (FEEDING SUPPLEMENT, VITAL HIGH PROTEIN,) LIQD liquid Place 1,000 mLs into feeding tube continuous. 04/05/20   Saverio Danker, PA-C  ondansetron [facility]) 4 MG/2ML SOLN injection Inject 2 mLs (4 mg total) into the vein every 6 (six) hours as needed for nausea or vomiting. 04/05/20   Saverio Danker, PA-C  oxyCODONE (ROXICODONE) 5 MG/5ML solution Take 5 mLs (5 mg total) by mouth every 6 (six) hours as needed for moderate pain. 04/05/20   Saverio Danker, PA-C  pantoprazole (PROTONIX) 40 MG injection Inject 40 mg into the vein daily. 04/06/20   Saverio Danker, PA-C  polyethylene glycol (MIRALAX / GLYCOLAX) 17 g packet Place 17 g into feeding tube daily. 04/06/20   Saverio Danker, PA-C  sodium  chloride 0.9 % infusion Inject 250 mLs into the vein as needed (for IV line care(Saline / Heparin Lock)). 04/05/20   Saverio Danker, PA-C  sodium chloride flush (NS) 0.9 % SOLN Inject 3 mLs into the vein every 12 (twelve) hours. 04/05/20   Saverio Danker, PA-C  sodium chloride flush (NS) 0.9 % SOLN 10-40 mLs by Intracatheter route every 12 (twelve) hours. 04/05/20   Saverio Danker, PA-C  Water For Irrigation, Sterile (FREE WATER) SOLN Place 200 mLs into feeding tube every 4  (four) hours. 04/05/20   Saverio Danker, PA-C     Vital Signs: LMP 12/14/2000   Physical Exam Vitals reviewed.  Skin:    General: Skin is warm and dry.     Comments: Site is clean and dry NT no bleeding OP cloudy yellow 20 cc in JP 50cc total from yesterday per chart Flushes easily     Imaging: DG Chest Port 1 View  Result Date: 04/08/2020 CLINICAL DATA:  Respiratory failure EXAM: PORTABLE CHEST 1 VIEW COMPARISON:  April 05, 2020 FINDINGS: Again noted is a tracheostomy tube at the level of the clavicular heads. A right-sided PICC seen within the lower SVC. NG tube is seen below the diaphragm. Again noted is hyperinflation of the lung zones. No airspace consolidation or pleural effusion. No acute osseous abnormality. IMPRESSION: Stable examination.  Findings of COPD. Electronically Signed   By: Prudencio Pair M.D.   On: 04/08/2020 06:56   DG CHEST PORT 1 VIEW  Result Date: 04/05/2020 CLINICAL DATA:  Tracheostomy tube placement. EXAM: PORTABLE CHEST 1 VIEW COMPARISON:  April 03, 2020 FINDINGS: There is stable tracheostomy tube, nasogastric tube and right-sided PICC line positioning. The heart size and mediastinal contours are within normal limits. 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 The visualized skeletal structures are unremarkable. IMPRESSION: No significant interval change when compared to the prior chest plain film dated April 03, 2020. Electronically Signed   By: Virgina Norfolk M.D.   On: 04/05/2020 23:17   DG Abd Portable 1V  Result Date: 04/05/2020 CLINICAL DATA:  Nasogastric tube placement. EXAM: PORTABLE ABDOMEN - 1 VIEW COMPARISON:  None. FINDINGS: Nasogastric tube is seen with its distal tip overlying the body of the stomach. The bowel gas pattern is normal. No radio-opaque calculi or other significant radiographic abnormality are seen. A single left-sided  percutaneous nephrostomy tube is present. IMPRESSION: Nasogastric tube positioning, as described above. Electronically Signed   By: Virgina Norfolk M.D.   On: 04/05/2020 23:15   VAS [name] UPPER EXTREMITY VENOUS DUPLEX  Result Date: 04/05/2020 UPPER VENOUS STUDY  Indications: Swelling, and PICC Comparison Study: no prior Performing Technologist: June Leap RDMS, RVT  Examination Guidelines: A complete evaluation includes B-mode imaging, spectral Doppler, color Doppler, and power Doppler as needed of all accessible portions of each vessel. Bilateral testing is considered an integral part of a complete examination. Limited examinations for reoccurring indications may be performed as noted.  Right Findings: +----------+------------+---------+-----------+----------+---------------------+ RIGHT     CompressiblePhasicitySpontaneousProperties       Summary        +----------+------------+---------+-----------+----------+---------------------+ IJV                                                   not imaged due to  bandages/ hardware   +----------+------------+---------+-----------+----------+---------------------+ Subclavian  Partial      Yes       Yes                                    +----------+------------+---------+-----------+----------+---------------------+ Axillary      None       No        No                                     +----------+------------+---------+-----------+----------+---------------------+ Brachial                                            not visualized due to                                                         PICC bandages     +----------+------------+---------+-----------+----------+---------------------+ Radial        Full                                                        +----------+------------+---------+-----------+----------+---------------------+ Ulnar          Full                                                        +----------+------------+---------+-----------+----------+---------------------+ Cephalic      Full                                                        +----------+------------+---------+-----------+----------+---------------------+ Basilic                                             not visualized due to                                                         PICC bandages     +----------+------------+---------+-----------+----------+---------------------+  Left Findings: +----------+------------+---------+-----------+----------+-------+ LEFT      CompressiblePhasicitySpontaneousPropertiesSummary +----------+------------+---------+-----------+----------+-------+ Subclavian               Yes       Yes                      +----------+------------+---------+-----------+----------+-------+  Summary:  Right: Findings consistent with acute deep  vein thrombosis involving the right subclavian vein and right axillary vein.  Left: No evidence of thrombosis in the subclavian.  *See table(s) above for measurements and observations.  Diagnosing physician: Harold Barban MD Electronically signed by Harold Barban MD on 04/05/2020 at 5:08:13 PM.    Final     Labs:  CBC: Recent Labs    03/30/20 0313 04/01/20 0319 04/02/20 0613 04/06/20 0251  WBC 6.4 8.2 7.8 10.9*  HGB 7.6* 8.3* 7.8* 9.1*  HCT 24.9* 27.0* 25.3* 29.0*  PLT 332 454* 516* 711*    COAGS: Recent Labs    03/09/20 1752  INR 1.3*    BMP: Recent Labs    04/01/20 0319 04/02/20 0613 04/04/20 0418 04/06/20 0251  NA 138 137 138 134*  K 3.1* 4.0 3.7 4.2  CL 99 100 101 95*  CO2 28 29 29 29   GLUCOSE 145* 140* 125* 119*  BUN 41* 38* 28* 20  CALCIUM 8.2* 8.0* 8.4* 8.9  CREATININE 0.39* 0.38* 0.32* 0.49  GFRNONAA >60 >60 >60 >60  GFRAA >60 >60 >60 >60    LIVER FUNCTION TESTS: Recent Labs    03/28/20 0551 04/01/20 0319 04/04/20 0418  04/06/20 0251  BILITOT 0.7 0.6 0.4 0.2*  AST 38 35 29 32  ALT 67* 50* 39 41  ALKPHOS 274* 251* 186* 176*  PROT 5.1* 5.5* 5.2* 5.6*  ALBUMIN 2.0* 2.0* 2.0* 2.2*    Assessment and Plan:  Left and abscess drain intact OP 20 cc in JP--- cloudy yellow 50 cc total yesterday Will follow When OP less than 20 cc daily-- will need re CT to evaluate collection   Electronically Signed: Lavonia Drafts, PA-C 04/08/2020, 2:42 PM   I spent a total of 15 Minutes at the the patient's bedside AND on the patient's hospital floor or unit, greater than 50% of which was counseling/coordinating care for Left abd drain

## 2020-04-08 NOTE — Progress Notes (Signed)
Pulmonary Critical Care Medicine [place]   PULMONARY CRITICAL CARE SERVICE  PROGRESS NOTE  Date of Service: 04/08/2020  Tracy [name]  [id number]  DOB: 08-15-1951   DOA: 04/05/2020  Referring Physician: Merton Border, MD  HPI: Tracy [name] is a 69 y.o. female seen for follow up of Acute on Chronic Respiratory Failure.  Patient currently is on pressure support has been on 30% FiO2  Medications: Reviewed on Rounds  Physical Exam:  Vitals: Temperature is 98.1 pulse 91 respiratory rate 28 blood pressure is 132/60 saturations 97%  Ventilator Settings on pressure support FiO2 30% tidal line 385 pressure support 12 PEEP 5  . General: Comfortable at this time . Eyes: Grossly normal lids, irises & conjunctiva . ENT: grossly tongue is normal . Neck: no obvious mass . Cardiovascular: S1 S2 normal no gallop . Respiratory: Coarse breath sounds with few scattered rhonchi . Abdomen: soft . Skin: no rash seen on limited exam . Musculoskeletal: not rigid . Psychiatric:unable to assess . Neurologic: no seizure no involuntary movements         Lab Data:   Basic Metabolic Panel: Recent Labs  Lab 04/02/20 0613 04/04/20 0418 04/06/20 0251  NA 137 138 134*  K 4.0 3.7 4.2  CL 100 101 95*  CO2 29 29 29   GLUCOSE 140* 125* 119*  BUN 38* 28* 20  CREATININE 0.38* 0.32* 0.49  CALCIUM 8.0* 8.4* 8.9  MG  --  1.8 1.6*  PHOS  --  3.9  --     ABG: Recent Labs  Lab 04/06/20 0055  PHART 7.457*  PCO2ART 40.2  PO2ART 74.2*  HCO3 28.0  O2SAT 95.8    Liver Function Tests: Recent Labs  Lab 04/04/20 0418 04/06/20 0251  AST 29 32  ALT 39 41  ALKPHOS 186* 176*  BILITOT 0.4 0.2*  PROT 5.2* 5.6*  ALBUMIN 2.0* 2.2*   No results for input(s): LIPASE, AMYLASE in the last 168 hours. No results for input(s): AMMONIA in the last 168 hours.  CBC: Recent Labs  Lab 04/02/20 0613 04/06/20 0251  WBC 7.8 10.9*  HGB 7.8* 9.1*  HCT 25.3* 29.0*  MCV 97.7  95.1  PLT 516* 711*    Cardiac Enzymes: No results for input(s): CKTOTAL, CKMB, CKMBINDEX, TROPONINI in the last 168 hours.  BNP (last 3 results) Recent Labs    04/08/20 0714  BNP 42.9    ProBNP (last 3 results) No results for input(s): PROBNP in the last 8760 hours.  Radiological Exams: DG Chest Port 1 View  Result Date: 04/08/2020 CLINICAL DATA:  Respiratory failure EXAM: PORTABLE CHEST 1 VIEW COMPARISON:  April 05, 2020 FINDINGS: Again noted is a tracheostomy tube at the level of the clavicular heads. A right-sided PICC seen within the lower SVC. NG tube is seen below the diaphragm. Again noted is hyperinflation of the lung zones. No airspace consolidation or pleural effusion. No acute osseous abnormality. IMPRESSION: Stable examination.  Findings of COPD. Electronically Signed   By: Prudencio Pair M.D.   On: 04/08/2020 06:56    Assessment/Plan Active Problems:   Acute on chronic respiratory failure with hypoxia (HCC)   COPD, severe (HCC)   Perforation of sigmoid colon (HCC)   Acute deep vein thrombosis (DVT) of brachial vein of right upper extremity ([place])   Tracheostomy status ([place])   1. Acute on chronic respiratory failure with hypoxia plan is to continue with the weaning protocol right now doing pressure support will advance as tolerated. 2.  Severe COPD at baseline we will continue with present management 3. Perforation of sigmoid status post resection 4. Acute DVT treated clinically improved 5. Tracheostomy remains in place   I have personally seen and evaluated the patient, evaluated laboratory and imaging results, formulated the assessment and plan and placed orders. The Patient requires high complexity decision making with multiple systems involvement.  Rounds were done with the Respiratory Therapy Director and Staff therapists and discussed with nursing staff also.  Allyne Gee, MD [facility] Pulmonary Critical Care Medicine Sleep Medicine

## 2020-04-09 ENCOUNTER — Other Ambulatory Visit (HOSPITAL_COMMUNITY): Payer: Medicare HMO

## 2020-04-09 LAB — BASIC METABOLIC PANEL
Anion gap: 11 (ref 5–15)
BUN: 14 mg/dL (ref 8–23)
CO2: 29 mmol/L (ref 22–32)
Calcium: 9 mg/dL (ref 8.9–10.3)
Chloride: 96 mmol/L — ABNORMAL LOW (ref 98–111)
Creatinine, Ser: 0.5 mg/dL (ref 0.44–1.00)
GFR calc Af Amer: >60 mL/min (ref >60)
GFR calc non Af Amer: >60 mL/min (ref >60)
Glucose, Bld: 129 mg/dL — ABNORMAL HIGH (ref 70–99)
Potassium: 4.5 mmol/L (ref 3.5–5.1)
Sodium: 136 mmol/L (ref 135–145)

## 2020-04-09 LAB — CBC
HCT: 29 % — ABNORMAL LOW (ref 36.0–46.0)
Hemoglobin: 8.9 g/dL — ABNORMAL LOW (ref 12.0–15.0)
MCH: 30 pg (ref 26.0–34.0)
MCHC: 30.7 g/dL (ref 30.0–36.0)
MCV: 97.6 fL (ref 80.0–100.0)
Platelets: 680 10*3/uL — ABNORMAL HIGH (ref 150–400)
RBC: 2.97 MIL/uL — ABNORMAL LOW (ref 3.87–5.11)
RDW: 15.6 % — ABNORMAL HIGH (ref 11.5–15.5)
WBC: 12.8 10*3/uL — ABNORMAL HIGH (ref 4.0–10.5)
nRBC: 0.2 % (ref 0.0–0.2)

## 2020-04-09 NOTE — Progress Notes (Signed)
Referring Physician(s): Dr. Laren Everts  Supervising Physician: Aletta Edouard  Patient Status:  [facility] IP  Chief Complaint: Left abscess drain; Placed in IR 03/20/20 (perforated sigmoid colon; rectal Ca)  Subjective: Patient awake, alert, able to nod head appropriately to questions. 30 cc output since yesterday, cloudy yellow. Dressing is clean, dry and intact. Patient denies pain/discomfort around the site.   Allergies: Codeine  Medications: Prior to Admission medications   Medication Sig Start Date End Date Taking? Authorizing Provider  ALPRAZolam (XANAX) 0.25 MG tablet Take 1 tablet (0.25 mg total) by mouth 3 (three) times daily as needed for anxiety. 04/05/20   Saverio Danker, PA-C  apixaban (ELIQUIS) 5 MG TABS tablet Place 2 tablets (10 mg total) into feeding tube 2 (two) times daily. 04/05/20   Saverio Danker, PA-C  apixaban (ELIQUIS) 5 MG TABS tablet Place 1 tablet (5 mg total) into feeding tube 2 (two) times daily. 04/12/20   Saverio Danker, PA-C  arformoterol (BROVANA) 15 MCG/2ML NEBU Take 2 mLs (15 mcg total) by nebulization 2 (two) times daily. 04/05/20   Saverio Danker, PA-C  bethanechol (URECHOLINE) 25 MG tablet Place 1 tablet (25 mg total) into feeding tube 3 (three) times daily. 04/05/20   Saverio Danker, PA-C  budesonide (PULMICORT) 0.5 MG/2ML nebulizer solution Take 2 mLs (0.5 mg total) by nebulization 2 (two) times daily. 04/05/20   Saverio Danker, PA-C  chlorhexidine gluconate, MEDLINE KIT, (PERIDEX) 0.12 % solution 15 mLs by Mouth Rinse route 2 (two) times daily. 04/05/20   Saverio Danker, PA-C  clonazePAM (KLONOPIN) 0.5 MG disintegrating tablet Take 1 tablet (0.5 mg total) by mouth 2 (two) times daily. 04/05/20   Saverio Danker, PA-C  docusate (COLACE) 50 MG/5ML liquid Place 10 mLs (100 mg total) into feeding tube 2 (two) times daily. 04/05/20   Saverio Danker, PA-C  HYDROmorphone (DILAUDID) 1 MG/ML injection Inject 0.5-1 mLs (0.5-1 mg total) into the vein every 2 (two) hours as  needed (sedation need, use to maintain MD ordered RASS target). 04/05/20   Saverio Danker, PA-C  insulin aspart (NOVOLOG) 100 UNIT/ML injection Inject 0-15 Units into the skin every 4 (four) hours. 04/05/20   Saverio Danker, PA-C  ipratropium-albuterol (DUONEB) 0.5-2.5 (3) MG/3ML SOLN Take 3 mLs by nebulization every 4 (four) hours as needed. 04/05/20   Saverio Danker, PA-C  labetalol (NORMODYNE) 5 MG/ML injection Inject 1 mL (5 mg total) into the vein every 2 (two) hours as needed (Goal SBP < 140, DBP < 105). 04/05/20   Saverio Danker, PA-C  midazolam (VERSED) 2 MG/2ML SOLN injection Inject 0.5-1 mLs (0.5-1 mg total) into the vein every 2 (two) hours as needed (Sedation needs, use to maintain MD ordered RASS target). 04/05/20   Saverio Danker, PA-C  mouth rinse LIQD solution 15 mLs by Mouth Rinse route daily. 04/05/20   Saverio Danker, PA-C  Nutritional Supplements (FEEDING SUPPLEMENT, VITAL HIGH PROTEIN,) LIQD liquid Place 1,000 mLs into feeding tube continuous. 04/05/20   Saverio Danker, PA-C  ondansetron [facility]) 4 MG/2ML SOLN injection Inject 2 mLs (4 mg total) into the vein every 6 (six) hours as needed for nausea or vomiting. 04/05/20   Saverio Danker, PA-C  oxyCODONE (ROXICODONE) 5 MG/5ML solution Take 5 mLs (5 mg total) by mouth every 6 (six) hours as needed for moderate pain. 04/05/20   Saverio Danker, PA-C  pantoprazole (PROTONIX) 40 MG injection Inject 40 mg into the vein daily. 04/06/20   Saverio Danker, PA-C  polyethylene glycol (MIRALAX / GLYCOLAX) 17 g packet Place 17  g into feeding tube daily. 04/06/20   Saverio Danker, PA-C  sodium chloride 0.9 % infusion Inject 250 mLs into the vein as needed (for IV line care(Saline / Heparin Lock)). 04/05/20   Saverio Danker, PA-C  sodium chloride flush (NS) 0.9 % SOLN Inject 3 mLs into the vein every 12 (twelve) hours. 04/05/20   Saverio Danker, PA-C  sodium chloride flush (NS) 0.9 % SOLN 10-40 mLs by Intracatheter route every 12 (twelve) hours. 04/05/20    Saverio Danker, PA-C  Water For Irrigation, Sterile (FREE WATER) SOLN Place 200 mLs into feeding tube every 4 (four) hours. 04/05/20   Saverio Danker, PA-C     Vital Signs: LMP 12/14/2000   Physical Exam: vitals reviewed. Patient is awake/alert, trached/vented. JP drain with scant yellow/cloudy fluid. Dressing is clean and dry. No pain/tenderness upon palpation of the insertion site.   Imaging: DG Chest Port 1 View  Result Date: 04/08/2020 CLINICAL DATA:  Respiratory failure EXAM: PORTABLE CHEST 1 VIEW COMPARISON:  April 05, 2020 FINDINGS: Again noted is a tracheostomy tube at the level of the clavicular heads. A right-sided PICC seen within the lower SVC. NG tube is seen below the diaphragm. Again noted is hyperinflation of the lung zones. No airspace consolidation or pleural effusion. No acute osseous abnormality. IMPRESSION: Stable examination.  Findings of COPD. Electronically Signed   By: Prudencio Pair M.D.   On: 04/08/2020 06:56   DG CHEST PORT 1 VIEW  Result Date: 04/05/2020 CLINICAL DATA:  Tracheostomy tube placement. EXAM: PORTABLE CHEST 1 VIEW COMPARISON:  April 03, 2020 FINDINGS: There is stable tracheostomy tube, nasogastric tube and right-sided PICC line positioning. The heart size and mediastinal contours are within normal limits. 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 The visualized skeletal structures are unremarkable. IMPRESSION: No significant interval change when compared to the prior chest plain film dated April 03, 2020. Electronically Signed   By: Virgina Norfolk M.D.   On: 04/05/2020 23:17   DG Abd Portable 1V  Result Date: 04/05/2020 CLINICAL DATA:  Nasogastric tube placement. EXAM: PORTABLE ABDOMEN - 1 VIEW COMPARISON:  None. FINDINGS: Nasogastric tube is seen with its distal tip overlying the body of the stomach. The bowel gas pattern is normal. No radio-opaque  calculi or other significant radiographic abnormality are seen. A single left-sided percutaneous nephrostomy tube is present. IMPRESSION: Nasogastric tube positioning, as described above. Electronically Signed   By: Virgina Norfolk M.D.   On: 04/05/2020 23:15   VAS [name] UPPER EXTREMITY VENOUS DUPLEX  Result Date: 04/05/2020 UPPER VENOUS STUDY  Indications: Swelling, and PICC Comparison Study: no prior Performing Technologist: June Leap RDMS, RVT  Examination Guidelines: A complete evaluation includes B-mode imaging, spectral Doppler, color Doppler, and power Doppler as needed of all accessible portions of each vessel. Bilateral testing is considered an integral part of a complete examination. Limited examinations for reoccurring indications may be performed as noted.  Right Findings: +----------+------------+---------+-----------+----------+---------------------+  RIGHT      Compressible Phasicity Spontaneous Properties        Summary         +----------+------------+---------+-----------+----------+---------------------+  IJV  not imaged due to                                                               bandages/ hardware    +----------+------------+---------+-----------+----------+---------------------+  Subclavian   Partial       Yes        Yes                                       +----------+------------+---------+-----------+----------+---------------------+  Axillary       None        No         No                                        +----------+------------+---------+-----------+----------+---------------------+  Brachial                                                 not visualized due to                                                                PICC bandages      +----------+------------+---------+-----------+----------+---------------------+  Radial         Full                                                              +----------+------------+---------+-----------+----------+---------------------+  Ulnar          Full                                                             +----------+------------+---------+-----------+----------+---------------------+  Cephalic       Full                                                             +----------+------------+---------+-----------+----------+---------------------+  Basilic                                                  not visualized due to  PICC bandages      +----------+------------+---------+-----------+----------+---------------------+  Left Findings: +----------+------------+---------+-----------+----------+-------+  LEFT       Compressible Phasicity Spontaneous Properties Summary  +----------+------------+---------+-----------+----------+-------+  Subclavian                 Yes        Yes                         +----------+------------+---------+-----------+----------+-------+  Summary:  Right: Findings consistent with acute deep vein thrombosis involving the right subclavian vein and right axillary vein.  Left: No evidence of thrombosis in the subclavian.  *See table(s) above for measurements and observations.  Diagnosing physician: Harold Barban MD Electronically signed by Harold Barban MD on 04/05/2020 at 5:08:13 PM.    Final     Labs:  CBC: Recent Labs    03/30/20 0313 04/01/20 0319 04/02/20 0613 04/06/20 0251  WBC 6.4 8.2 7.8 10.9*  HGB 7.6* 8.3* 7.8* 9.1*  HCT 24.9* 27.0* 25.3* 29.0*  PLT 332 454* 516* 711*    COAGS: Recent Labs    03/09/20 1752  INR 1.3*    BMP: Recent Labs    04/01/20 0319 04/02/20 0613 04/04/20 0418 04/06/20 0251  NA 138 137 138 134*  K 3.1* 4.0 3.7 4.2  CL 99 100 101 95*  CO2 28 29 29 29   GLUCOSE 145* 140* 125* 119*  BUN 41* 38* 28* 20  CALCIUM 8.2* 8.0* 8.4* 8.9  CREATININE 0.39* 0.38* 0.32* 0.49  GFRNONAA >60 >60 >60 >60  GFRAA >60 >60 >60  >60    LIVER FUNCTION TESTS: Recent Labs    03/28/20 0551 04/01/20 0319 04/04/20 0418 04/06/20 0251  BILITOT 0.7 0.6 0.4 0.2*  AST 38 35 29 32  ALT 67* 50* 39 41  ALKPHOS 274* 251* 186* 176*  PROT 5.1* 5.5* 5.2* 5.6*  ALBUMIN 2.0* 2.0* 2.0* 2.2*    Assessment and Plan:  Left JP abscess drain intact, 30 cc output yesterday. When output is less than 20 cc daily will obtain CT to reevaluate collection.   Electronically Signed: Theresa Duty, NP 04/09/2020, 9:44 AM   I spent a total of 15 Minutes at the the patient's bedside AND on the patient's hospital floor or unit, greater than 50% of which was counseling/coordinating care for left abdominal abscess drain.

## 2020-04-09 NOTE — Progress Notes (Signed)
Pulmonary Critical Care Medicine [place]   PULMONARY CRITICAL CARE SERVICE  PROGRESS NOTE  Date of Service: 04/09/2020  Tracy [name]  [id number]  DOB: [birth year]   DOA: 04/05/2020  Referring Physician: Merton Border, MD  HPI: Tracy [name] is a 69 y.o. female seen for follow up of Acute on Chronic Respiratory Failure.  She is on assist control has been on 28% FiO2 with PEEP of 5.  This morning she was feeling quite nervous so she wanted her anxiety medications before attempting to wean  Medications: Reviewed on Rounds  Physical Exam:  Vitals: Temperature 99.6 pulse 96 respiratory rate 26 blood pressure 113/76 saturations 98%  Ventilator Settings on assist control FiO2 28% tidal volume 372 PEEP 5  . General: Comfortable at this time . Eyes: Grossly normal lids, irises & conjunctiva . ENT: grossly tongue is normal . Neck: no obvious mass . Cardiovascular: S1 S2 normal no gallop . Respiratory: No rhonchi coarse breath sounds . Abdomen: soft . Skin: no rash seen on limited exam . Musculoskeletal: not rigid . Psychiatric:unable to assess . Neurologic: no seizure no involuntary movements         Lab Data:   Basic Metabolic Panel: Recent Labs  Lab 04/04/20 0418 04/06/20 0251  NA 138 134*  K 3.7 4.2  CL 101 95*  CO2 29 29  GLUCOSE 125* 119*  BUN 28* 20  CREATININE 0.32* 0.49  CALCIUM 8.4* 8.9  MG 1.8 1.6*  PHOS 3.9  --     ABG: Recent Labs  Lab 04/06/20 0055  PHART 7.457*  PCO2ART 40.2  PO2ART 74.2*  HCO3 28.0  O2SAT 95.8    Liver Function Tests: Recent Labs  Lab 04/04/20 0418 04/06/20 0251  AST 29 32  ALT 39 41  ALKPHOS 186* 176*  BILITOT 0.4 0.2*  PROT 5.2* 5.6*  ALBUMIN 2.0* 2.2*   No results for input(s): LIPASE, AMYLASE in the last 168 hours. No results for input(s): AMMONIA in the last 168 hours.  CBC: Recent Labs  Lab 04/06/20 0251  WBC 10.9*  HGB 9.1*  HCT 29.0*  MCV 95.1  PLT 711*    Cardiac  Enzymes: No results for input(s): CKTOTAL, CKMB, CKMBINDEX, TROPONINI in the last 168 hours.  BNP (last 3 results) Recent Labs    04/08/20 0714  BNP 42.9    ProBNP (last 3 results) No results for input(s): PROBNP in the last 8760 hours.  Radiological Exams: DG Chest Port 1 View  Result Date: 04/08/2020 CLINICAL DATA:  Respiratory failure EXAM: PORTABLE CHEST 1 VIEW COMPARISON:  April 05, 2020 FINDINGS: Again noted is a tracheostomy tube at the level of the clavicular heads. A right-sided PICC seen within the lower SVC. NG tube is seen below the diaphragm. Again noted is hyperinflation of the lung zones. No airspace consolidation or pleural effusion. No acute osseous abnormality. IMPRESSION: Stable examination.  Findings of COPD. Electronically Signed   By: Prudencio Pair M.D.   On: 04/08/2020 06:56    Assessment/Plan Active Problems:   Acute on chronic respiratory failure with hypoxia (HCC)   COPD, severe (HCC)   Perforation of sigmoid colon (HCC)   Acute deep vein thrombosis (DVT) of brachial vein of right upper extremity ([place])   Tracheostomy status ([place])   1. Acute on chronic respiratory failure hypoxia plan is to continue with assist control titrate oxygen continue pulmonary toilet 2. Severe COPD at baseline we will continue to follow 3. Sigmoid perforation status post resection  4. Acute DVT treated 5. Tracheostomy remains in place at this time for weaning   I have personally seen and evaluated the patient, evaluated laboratory and imaging results, formulated the assessment and plan and placed orders. The Patient requires high complexity decision making with multiple systems involvement.  Rounds were done with the Respiratory Therapy Director and Staff therapists and discussed with nursing staff also.  Allyne Gee, MD [facility] Pulmonary Critical Care Medicine Sleep Medicine

## 2020-04-10 NOTE — Progress Notes (Signed)
Pulmonary Critical Care Medicine [place]   PULMONARY CRITICAL CARE SERVICE  PROGRESS NOTE  Date of Service: 04/10/2020  Tracy [name]  [id number]  DOB: 05/05/1951   DOA: 04/05/2020  Referring Physician: Merton Border, MD  HPI: Tracy [name] is a 69 y.o. female seen for follow up of Acute on Chronic Respiratory Failure.  She was attempted on her T collar weaning however did not tolerate it had a lot of issues with anxiety so was placed back on the ventilator on full support right now is on assist control mode.  Medications: Reviewed on Rounds  Physical Exam:  Vitals: Temperature 96.1 pulse 75 respiratory 22 blood pressure is 147/88 saturations 99%  Ventilator Settings on assist control FiO2 30% tidal volume 400 PEEP 5  . General: Comfortable at this time . Eyes: Grossly normal lids, irises & conjunctiva . ENT: grossly tongue is normal . Neck: no obvious mass . Cardiovascular: S1 S2 normal no gallop . Respiratory: No rhonchi coarse breath sounds . Abdomen: soft . Skin: no rash seen on limited exam . Musculoskeletal: not rigid . Psychiatric:unable to assess . Neurologic: no seizure no involuntary movements         Lab Data:   Basic Metabolic Panel: Recent Labs  Lab 04/04/20 0418 04/06/20 0251 04/09/20 1205  NA 138 134* 136  K 3.7 4.2 4.5  CL 101 95* 96*  CO2 29 29 29   GLUCOSE 125* 119* 129*  BUN 28* 20 14  CREATININE 0.32* 0.49 0.50  CALCIUM 8.4* 8.9 9.0  MG 1.8 1.6*  --   PHOS 3.9  --   --     ABG: Recent Labs  Lab 04/06/20 0055  PHART 7.457*  PCO2ART 40.2  PO2ART 74.2*  HCO3 28.0  O2SAT 95.8    Liver Function Tests: Recent Labs  Lab 04/04/20 0418 04/06/20 0251  AST 29 32  ALT 39 41  ALKPHOS 186* 176*  BILITOT 0.4 0.2*  PROT 5.2* 5.6*  ALBUMIN 2.0* 2.2*   No results for input(s): LIPASE, AMYLASE in the last 168 hours. No results for input(s): AMMONIA in the last 168 hours.  CBC: Recent Labs  Lab  04/06/20 0251 04/09/20 1205  WBC 10.9* 12.8*  HGB 9.1* 8.9*  HCT 29.0* 29.0*  MCV 95.1 97.6  PLT 711* 680*    Cardiac Enzymes: No results for input(s): CKTOTAL, CKMB, CKMBINDEX, TROPONINI in the last 168 hours.  BNP (last 3 results) Recent Labs    04/08/20 0714  BNP 42.9    ProBNP (last 3 results) No results for input(s): PROBNP in the last 8760 hours.  Radiological Exams: DG CHEST PORT 1 VIEW  Result Date: 04/09/2020 CLINICAL DATA:  Shortness of breath EXAM: PORTABLE CHEST 1 VIEW COMPARISON:  April 08, 2020 FINDINGS: Tracheostomy catheter tip is 5.2 cm above the carina. Nasogastric tube tip and side port are below the diaphragm. No pneumothorax. There is no edema or airspace opacity. Heart size and pulmonary vascularity are normal. No adenopathy. No bone lesions. IMPRESSION: Tube positions as described without pneumothorax. No edema or airspace opacity. Stable cardiac silhouette. Electronically Signed   By: Lowella Grip III M.D.   On: 04/09/2020 13:25    Assessment/Plan Active Problems:   Acute on chronic respiratory failure with hypoxia (HCC)   COPD, severe (HCC)   Perforation of sigmoid colon (HCC)   Acute deep vein thrombosis (DVT) of brachial vein of right upper extremity ([place])   Tracheostomy status ([place])   1. Acute on  chronic respiratory failure hypoxia plan is to continue with attempts at weaning again once her anxiety is under better control we should be able to go back to T collar. 2. Severe COPD at baseline we will continue to monitor 3. Sigmoid colon perforation at baseline we will continue to follow 4. Acute DVT treated 5. Tracheostomy remains in place   I have personally seen and evaluated the patient, evaluated laboratory and imaging results, formulated the assessment and plan and placed orders. The Patient requires high complexity decision making with multiple systems involvement.  Rounds were done with the Respiratory Therapy Director and Staff  therapists and discussed with nursing staff also.  Allyne Gee, MD [facility] Pulmonary Critical Care Medicine Sleep Medicine

## 2020-04-10 NOTE — Progress Notes (Cosign Needed)
Referring Physician(s): Dr. Laren Everts  Supervising Physician: Corrie Mckusick  Patient Status:  [facility] IP  Chief Complaint: Left abscess drain; Placed in IR 03/20/20 (perforated sigmoid colon; rectal Ca)  Subjective: Patient awake, alert, able to nod head appropriately to questions. Husband is at the bedside. 10 cc output today, cloudy yellow. Dressing is clean, dry and intact. Patient denies pain/discomfort around the site.   Allergies: Codeine  Medications: Prior to Admission medications   Medication Sig Start Date End Date Taking? Authorizing Provider  ALPRAZolam (XANAX) 0.25 MG tablet Take 1 tablet (0.25 mg total) by mouth 3 (three) times daily as needed for anxiety. 04/05/20   Saverio Danker, PA-C  apixaban (ELIQUIS) 5 MG TABS tablet Place 2 tablets (10 mg total) into feeding tube 2 (two) times daily. 04/05/20   Saverio Danker, PA-C  apixaban (ELIQUIS) 5 MG TABS tablet Place 1 tablet (5 mg total) into feeding tube 2 (two) times daily. 04/12/20   Saverio Danker, PA-C  arformoterol (BROVANA) 15 MCG/2ML NEBU Take 2 mLs (15 mcg total) by nebulization 2 (two) times daily. 04/05/20   Saverio Danker, PA-C  bethanechol (URECHOLINE) 25 MG tablet Place 1 tablet (25 mg total) into feeding tube 3 (three) times daily. 04/05/20   Saverio Danker, PA-C  budesonide (PULMICORT) 0.5 MG/2ML nebulizer solution Take 2 mLs (0.5 mg total) by nebulization 2 (two) times daily. 04/05/20   Saverio Danker, PA-C  chlorhexidine gluconate, MEDLINE KIT, (PERIDEX) 0.12 % solution 15 mLs by Mouth Rinse route 2 (two) times daily. 04/05/20   Saverio Danker, PA-C  clonazePAM (KLONOPIN) 0.5 MG disintegrating tablet Take 1 tablet (0.5 mg total) by mouth 2 (two) times daily. 04/05/20   Saverio Danker, PA-C  docusate (COLACE) 50 MG/5ML liquid Place 10 mLs (100 mg total) into feeding tube 2 (two) times daily. 04/05/20   Saverio Danker, PA-C  HYDROmorphone (DILAUDID) 1 MG/ML injection Inject 0.5-1 mLs (0.5-1 mg total) into the vein every 2  (two) hours as needed (sedation need, use to maintain MD ordered RASS target). 04/05/20   Saverio Danker, PA-C  insulin aspart (NOVOLOG) 100 UNIT/ML injection Inject 0-15 Units into the skin every 4 (four) hours. 04/05/20   Saverio Danker, PA-C  ipratropium-albuterol (DUONEB) 0.5-2.5 (3) MG/3ML SOLN Take 3 mLs by nebulization every 4 (four) hours as needed. 04/05/20   Saverio Danker, PA-C  labetalol (NORMODYNE) 5 MG/ML injection Inject 1 mL (5 mg total) into the vein every 2 (two) hours as needed (Goal SBP < 140, DBP < 105). 04/05/20   Saverio Danker, PA-C  midazolam (VERSED) 2 MG/2ML SOLN injection Inject 0.5-1 mLs (0.5-1 mg total) into the vein every 2 (two) hours as needed (Sedation needs, use to maintain MD ordered RASS target). 04/05/20   Saverio Danker, PA-C  mouth rinse LIQD solution 15 mLs by Mouth Rinse route daily. 04/05/20   Saverio Danker, PA-C  Nutritional Supplements (FEEDING SUPPLEMENT, VITAL HIGH PROTEIN,) LIQD liquid Place 1,000 mLs into feeding tube continuous. 04/05/20   Saverio Danker, PA-C  ondansetron [facility]) 4 MG/2ML SOLN injection Inject 2 mLs (4 mg total) into the vein every 6 (six) hours as needed for nausea or vomiting. 04/05/20   Saverio Danker, PA-C  oxyCODONE (ROXICODONE) 5 MG/5ML solution Take 5 mLs (5 mg total) by mouth every 6 (six) hours as needed for moderate pain. 04/05/20   Saverio Danker, PA-C  pantoprazole (PROTONIX) 40 MG injection Inject 40 mg into the vein daily. 04/06/20   Saverio Danker, PA-C  polyethylene glycol (MIRALAX / GLYCOLAX) 17  g packet Place 17 g into feeding tube daily. 04/06/20   Osborne, Kelly, PA-C  sodium chloride 0.9 % infusion Inject 250 mLs into the vein as needed (for IV line care(Saline / Heparin Lock)). 04/05/20   Osborne, Kelly, PA-C  sodium chloride flush (NS) 0.9 % SOLN Inject 3 mLs into the vein every 12 (twelve) hours. 04/05/20   Osborne, Kelly, PA-C  sodium chloride flush (NS) 0.9 % SOLN 10-40 mLs by Intracatheter route every 12 (twelve)  hours. 04/05/20   Osborne, Kelly, PA-C  Water For Irrigation, Sterile (FREE WATER) SOLN Place 200 mLs into feeding tube every 4 (four) hours. 04/05/20   Osborne, Kelly, PA-C     Vital Signs: LMP 12/14/2000   Physical Exam Patient is awake/alert, trached/vented. JP drain with scant yellow/cloudy fluid. Dressing is clean and dry. No pain/tenderness upon palpation of the insertion site. Some resistance when flushing the tube.   Imaging: DG CHEST PORT 1 VIEW  Result Date: 04/09/2020 CLINICAL DATA:  Shortness of breath EXAM: PORTABLE CHEST 1 VIEW COMPARISON:  April 08, 2020 FINDINGS: Tracheostomy catheter tip is 5.2 cm above the carina. Nasogastric tube tip and side port are below the diaphragm. No pneumothorax. There is no edema or airspace opacity. Heart size and pulmonary vascularity are normal. No adenopathy. No bone lesions. IMPRESSION: Tube positions as described without pneumothorax. No edema or airspace opacity. Stable cardiac silhouette. Electronically Signed   By: William  Woodruff III M.D.   On: 04/09/2020 13:25   DG Chest Port 1 View  Result Date: 04/08/2020 CLINICAL DATA:  Respiratory failure EXAM: PORTABLE CHEST 1 VIEW COMPARISON:  April 05, 2020 FINDINGS: Again noted is a tracheostomy tube at the level of the clavicular heads. A right-sided PICC seen within the lower SVC. NG tube is seen below the diaphragm. Again noted is hyperinflation of the lung zones. No airspace consolidation or pleural effusion. No acute osseous abnormality. IMPRESSION: Stable examination.  Findings of COPD. Electronically Signed   By: Bindu  Avutu M.D.   On: 04/08/2020 06:56    Labs:  CBC: Recent Labs    04/01/20 0319 04/02/20 0613 04/06/20 0251 04/09/20 1205  WBC 8.2 7.8 10.9* 12.8*  HGB 8.3* 7.8* 9.1* 8.9*  HCT 27.0* 25.3* 29.0* 29.0*  PLT 454* 516* 711* 680*    COAGS: Recent Labs    03/09/20 1752  INR 1.3*    BMP: Recent Labs    04/02/20 0613 04/04/20 0418 04/06/20 0251 04/09/20 1205   NA 137 138 134* 136  K 4.0 3.7 4.2 4.5  CL 100 101 95* 96*  CO2 29 29 29 29  GLUCOSE 140* 125* 119* 129*  BUN 38* 28* 20 14  CALCIUM 8.0* 8.4* 8.9 9.0  CREATININE 0.38* 0.32* 0.49 0.50  GFRNONAA >60 >60 >60 >60  GFRAA >60 >60 >60 >60    LIVER FUNCTION TESTS: Recent Labs    03/28/20 0551 04/01/20 0319 04/04/20 0418 04/06/20 0251  BILITOT 0.7 0.6 0.4 0.2*  AST 38 35 29 32  ALT 67* 50* 39 41  ALKPHOS 274* 251* 186* 176*  PROT 5.1* 5.5* 5.2* 5.6*  ALBUMIN 2.0* 2.0* 2.0* 2.2*    Assessment and Plan:  Left JP abscess drain intact, approximately 10 cc output during today's shift. Will assess tomorrow to determine if it's appropriate to obtain a CT scan to evaluate the site and get drain tube pulled.  Electronically Signed:  R , NP 04/10/2020, 4:40 PM   I spent a total of 15 Minutes at the   the patient's bedside AND on the patient's hospital floor or unit, greater than 50% of which was counseling/coordinating care for left abdominal abscess drain.      

## 2020-04-11 ENCOUNTER — Other Ambulatory Visit (HOSPITAL_COMMUNITY): Payer: Medicare HMO

## 2020-04-11 NOTE — Progress Notes (Signed)
Pulmonary Critical Care Medicine [place]   PULMONARY CRITICAL CARE SERVICE  PROGRESS NOTE  Date of Service: 04/11/2020  Tracy [name]  [id number]  DOB: 1951/10/14   DOA: 04/05/2020  Referring Physician: Merton Border, MD  HPI: Tracy [name] is a 69 y.o. female seen for follow up of Acute on Chronic Respiratory Failure.  Patient is on assist control currently on 35% FiO2 PEEP of 5 has been on tidal volume 400  Medications: Reviewed on Rounds  Physical Exam:  Vitals: Temperature 97.9 pulse 87 respiratory 26 blood pressure is 166/59 saturations 99%  Ventilator Settings on assist control FiO2 35% PEEP 5 tidal volume 400  . General: Comfortable at this time . Eyes: Grossly normal lids, irises & conjunctiva . ENT: grossly tongue is normal . Neck: no obvious mass . Cardiovascular: S1 S2 normal no gallop . Respiratory: No rhonchi no rales . Abdomen: soft . Skin: no rash seen on limited exam . Musculoskeletal: not rigid . Psychiatric:unable to assess . Neurologic: no seizure no involuntary movements         Lab Data:   Basic Metabolic Panel: Recent Labs  Lab 04/06/20 0251 04/09/20 1205  NA 134* 136  K 4.2 4.5  CL 95* 96*  CO2 29 29  GLUCOSE 119* 129*  BUN 20 14  CREATININE 0.49 0.50  CALCIUM 8.9 9.0  MG 1.6*  --     ABG: Recent Labs  Lab 04/06/20 0055  PHART 7.457*  PCO2ART 40.2  PO2ART 74.2*  HCO3 28.0  O2SAT 95.8    Liver Function Tests: Recent Labs  Lab 04/06/20 0251  AST 32  ALT 41  ALKPHOS 176*  BILITOT 0.2*  PROT 5.6*  ALBUMIN 2.2*   No results for input(s): LIPASE, AMYLASE in the last 168 hours. No results for input(s): AMMONIA in the last 168 hours.  CBC: Recent Labs  Lab 04/06/20 0251 04/09/20 1205  WBC 10.9* 12.8*  HGB 9.1* 8.9*  HCT 29.0* 29.0*  MCV 95.1 97.6  PLT 711* 680*    Cardiac Enzymes: No results for input(s): CKTOTAL, CKMB, CKMBINDEX, TROPONINI in the last 168 hours.  BNP (last 3  results) Recent Labs    04/08/20 0714  BNP 42.9    ProBNP (last 3 results) No results for input(s): PROBNP in the last 8760 hours.  Radiological Exams: DG Abd 1 View  Result Date: 04/11/2020 CLINICAL DATA:  Evaluate NG tube placement. EXAM: ABDOMEN - 1 VIEW COMPARISON:  04/09/2020 FINDINGS: The nasogastric tube has been removed and is no longer visualized. There is a left-sided percutaneous nephrostomy tube in place. Small pleural effusions are noted within the lung bases. The bowel gas pattern appears nonobstructed. IMPRESSION: 1. The nasogastric tube is no longer visualized and presumably has either been removed or is in the upper aerodigestive tract. Electronically Signed   By: Kerby Moors M.D.   On: 04/11/2020 12:12    Assessment/Plan Active Problems:   Acute on chronic respiratory failure with hypoxia (HCC)   COPD, severe (HCC)   Perforation of sigmoid colon (HCC)   Acute deep vein thrombosis (DVT) of brachial vein of right upper extremity ([place])   Tracheostomy status ([place])   1. Acute on chronic respiratory failure hypoxia we will continue with assist control titrate oxygen down as tolerated.  Had lengthy discussion with the patient as well as the patient's husband she does have a high degree of anxiety.  She also states that she is to regularly drink wine in the  evening time so there may be some anxiety issues going on from long-term wine drinking.  I looked up her records she does have a history of severe COPD with an FEV1 of about 30% spoke with primary care team we will add anticholinergic on a regular schedule as well as continue with inhaled steroids in place from a IV steroids to see if this might help [name] to move her on the weaning in addition to this we will place her on some Xanax for her anxiety 3 times daily 2. Severe COPD as discussed above FEV1 was 30% based on the notes on her last visit to her pulmonologist.  She will be placed back on her anticholinergics as well as  inhaled steroids.  As well as we will start her on a short course of oral steroids.  And I spoke about placing her on theophylline with the primary care team 3. Acute DVT treated we will continue to follow 4. Tracheostomy remains in place 5. Perforation of sigmoid colon status post resection   I have personally seen and evaluated the patient, evaluated laboratory and imaging results, formulated the assessment and plan and placed orders. The Patient requires high complexity decision making with multiple systems involvement.  Rounds were done with the Respiratory Therapy Director and Staff therapists and discussed with nursing staff also.  Time 35 minutes  Allyne Gee, MD [facility] Pulmonary Critical Care Medicine Sleep Medicine

## 2020-04-12 ENCOUNTER — Other Ambulatory Visit (HOSPITAL_COMMUNITY): Payer: Medicare HMO

## 2020-04-12 LAB — CBC
HCT: 26.6 % — ABNORMAL LOW (ref 36.0–46.0)
Hemoglobin: 8.1 g/dL — ABNORMAL LOW (ref 12.0–15.0)
MCH: 29.1 pg (ref 26.0–34.0)
MCHC: 30.5 g/dL (ref 30.0–36.0)
MCV: 95.7 fL (ref 80.0–100.0)
Platelets: 566 10*3/uL — ABNORMAL HIGH (ref 150–400)
RBC: 2.78 MIL/uL — ABNORMAL LOW (ref 3.87–5.11)
RDW: 14.7 % (ref 11.5–15.5)
WBC: 7.2 10*3/uL (ref 4.0–10.5)
nRBC: 0 % (ref 0.0–0.2)

## 2020-04-12 LAB — BASIC METABOLIC PANEL
Anion gap: 11 (ref 5–15)
BUN: 24 mg/dL — ABNORMAL HIGH (ref 8–23)
CO2: 29 mmol/L (ref 22–32)
Calcium: 9.1 mg/dL (ref 8.9–10.3)
Chloride: 99 mmol/L (ref 98–111)
Creatinine, Ser: 0.45 mg/dL (ref 0.44–1.00)
GFR calc Af Amer: >60 mL/min (ref >60)
GFR calc non Af Amer: >60 mL/min (ref >60)
Glucose, Bld: 188 mg/dL — ABNORMAL HIGH (ref 70–99)
Potassium: 4.1 mmol/L (ref 3.5–5.1)
Sodium: 139 mmol/L (ref 135–145)

## 2020-04-12 MED ORDER — IOHEXOL 300 MG/ML  SOLN
100.0000 mL | Freq: Once | INTRAMUSCULAR | Status: AC | PRN
  Administered 2020-04-12: 100 mL via INTRAVENOUS

## 2020-04-12 NOTE — Progress Notes (Signed)
Pulmonary Critical Care Medicine [place]   PULMONARY CRITICAL CARE SERVICE  PROGRESS NOTE  Date of Service: 04/12/2020  Tracy [name]  [id number]  DOB: [birth year]   DOA: 04/05/2020  Referring Physician: Merton Border, MD  HPI: Tracy [name] is a 69 y.o. female seen for follow up of Acute on Chronic Respiratory Failure.  She looks much better today she is more calm and relaxed.  She is sitting up in the chair her husband was in the room she is is weaning on pressure support  Medications: Reviewed on Rounds  Physical Exam:  Vitals: Temperature is 98.6 pulse 85 respiratory rate 22 blood pressure is 148/63 saturations 99%  Ventilator Settings on pressure support FiO2 35% pressure support 12/5  . General: Comfortable at this time . Eyes: Grossly normal lids, irises & conjunctiva . ENT: grossly tongue is normal . Neck: no obvious mass . Cardiovascular: S1 S2 normal no gallop . Respiratory: No rhonchi coarse breath sounds are noted . Abdomen: soft . Skin: no rash seen on limited exam . Musculoskeletal: not rigid . Psychiatric:unable to assess . Neurologic: no seizure no involuntary movements         Lab Data:   Basic Metabolic Panel: Recent Labs  Lab 04/06/20 0251 04/09/20 1205 04/12/20 0509  NA 134* 136 139  K 4.2 4.5 4.1  CL 95* 96* 99  CO2 29 29 29   GLUCOSE 119* 129* 188*  BUN 20 14 24*  CREATININE 0.49 0.50 0.45  CALCIUM 8.9 9.0 9.1  MG 1.6*  --   --     ABG: Recent Labs  Lab 04/06/20 0055  PHART 7.457*  PCO2ART 40.2  PO2ART 74.2*  HCO3 28.0  O2SAT 95.8    Liver Function Tests: Recent Labs  Lab 04/06/20 0251  AST 32  ALT 41  ALKPHOS 176*  BILITOT 0.2*  PROT 5.6*  ALBUMIN 2.2*   No results for input(s): LIPASE, AMYLASE in the last 168 hours. No results for input(s): AMMONIA in the last 168 hours.  CBC: Recent Labs  Lab 04/06/20 0251 04/09/20 1205 04/12/20 0509  WBC 10.9* 12.8* 7.2  HGB 9.1* 8.9* 8.1*   HCT 29.0* 29.0* 26.6*  MCV 95.1 97.6 95.7  PLT 711* 680* 566*    Cardiac Enzymes: No results for input(s): CKTOTAL, CKMB, CKMBINDEX, TROPONINI in the last 168 hours.  BNP (last 3 results) Recent Labs    04/08/20 0714  BNP 42.9    ProBNP (last 3 results) No results for input(s): PROBNP in the last 8760 hours.  Radiological Exams: DG Abd 1 View  Result Date: 04/11/2020 CLINICAL DATA:  NG placement EXAM: ABDOMEN - 1 VIEW COMPARISON:  04/11/2020 FINDINGS: NG tube tip in the stomach with the side hole at the GE junction. Recommend advancing NG tube approximately 10 cm. Pigtail drainage catheter overlying left abdomen unchanged in position. Normal bowel gas pattern. IMPRESSION: NG side hole at the GE junction.  Recommend advancing NG tube 10 cm Normal bowel gas pattern Electronically Signed   By: Franchot Gallo M.D.   On: 04/11/2020 13:22   DG Abd 1 View  Result Date: 04/11/2020 CLINICAL DATA:  Evaluate NG tube placement. EXAM: ABDOMEN - 1 VIEW COMPARISON:  04/09/2020 FINDINGS: The nasogastric tube has been removed and is no longer visualized. There is a left-sided percutaneous nephrostomy tube in place. Small pleural effusions are noted within the lung bases. The bowel gas pattern appears nonobstructed. IMPRESSION: 1. The nasogastric tube is no longer visualized and  presumably has either been removed or is in the upper aerodigestive tract. Electronically Signed   By: Kerby Moors M.D.   On: 04/11/2020 12:12   DG Abd Portable 1V  Result Date: 04/11/2020 CLINICAL DATA:  Confirm nasogastric tube placement. EXAM: PORTABLE ABDOMEN - 1 VIEW COMPARISON:  04/05/2020 FINDINGS: Nasogastric tube has been placed, tip overlying the level of the stomach. Visualized bowel gas pattern is nonobstructive. LEFT UPPER QUADRANT pigtail type catheter. IMPRESSION: Nasogastric tube tip to the level of the stomach. Electronically Signed   By: Nolon Nations M.D.   On: 04/11/2020 15:56     Assessment/Plan Active Problems:   Acute on chronic respiratory failure with hypoxia (HCC)   COPD, severe (HCC)   Perforation of sigmoid colon (HCC)   Acute deep vein thrombosis (DVT) of brachial vein of right upper extremity ([place])   Tracheostomy status ([place])   1. Acute on chronic respiratory failure hypoxia plan is to continue to wean on pressure support as tolerated.  She is doing well with 12/5 2. Severe COPD medications were adjusted yesterday she seems to be doing a little bit better also with her anxiety 3. Sigmoid perforation status post repair 4. Acute DVT treated 5. Tracheostomy remains in place   I have personally seen and evaluated the patient, evaluated laboratory and imaging results, formulated the assessment and plan and placed orders. The Patient requires high complexity decision making with multiple systems involvement.  Rounds were done with the Respiratory Therapy Director and Staff therapists and discussed with nursing staff also.  Allyne Gee, MD [facility] Pulmonary Critical Care Medicine Sleep Medicine

## 2020-04-13 ENCOUNTER — Encounter (HOSPITAL_BASED_OUTPATIENT_CLINIC_OR_DEPARTMENT_OTHER): Payer: Medicare HMO

## 2020-04-13 DIAGNOSIS — I82622 Acute embolism and thrombosis of deep veins of left upper extremity: Secondary | ICD-10-CM

## 2020-04-13 NOTE — Progress Notes (Signed)
VASCULAR LAB PRELIMINARY  PRELIMINARY  PRELIMINARY  PRELIMINARY  Left upper extremity venous duplex completed.    Preliminary report:  See CV proc for preliminary results.  Shadrick Senne, RVT 04/13/2020, 7:01 PM

## 2020-04-13 NOTE — Progress Notes (Signed)
Pulmonary Critical Care Medicine [place]   PULMONARY CRITICAL CARE SERVICE  PROGRESS NOTE  Date of Service: 04/13/2020  Tracy [name]  [id number]  DOB: 1951/05/14   DOA: 04/05/2020  Referring Physician: Merton Border, MD  HPI: Tracy [name] is a 69 y.o. female seen for follow up of Acute on Chronic Respiratory Failure.  Patient is on pressure support mode right now is on 40% FiO2 has been on 12/5 without any distress.  She is actually doing very well since her medications were adjusted  Medications: Reviewed on Rounds  Physical Exam:  Vitals: Temperature is 97.6 pulse 90 respiratory rate 16 blood pressure is 128/82 saturations 100%  Ventilator Settings mode of ventilation pressure support FiO2 40% pressure support 12 PEEP 5  . General: Comfortable at this time . Eyes: Grossly normal lids, irises & conjunctiva . ENT: grossly tongue is normal . Neck: no obvious mass . Cardiovascular: S1 S2 normal no gallop . Respiratory: No rhonchi coarse breath sounds noted at this time . Abdomen: soft . Skin: no rash seen on limited exam . Musculoskeletal: not rigid . Psychiatric:unable to assess . Neurologic: no seizure no involuntary movements         Lab Data:   Basic Metabolic Panel: Recent Labs  Lab 04/09/20 1205 04/12/20 0509  NA 136 139  K 4.5 4.1  CL 96* 99  CO2 29 29  GLUCOSE 129* 188*  BUN 14 24*  CREATININE 0.50 0.45  CALCIUM 9.0 9.1    ABG: No results for input(s): PHART, PCO2ART, PO2ART, HCO3, O2SAT in the last 168 hours.  Liver Function Tests: No results for input(s): AST, ALT, ALKPHOS, BILITOT, PROT, ALBUMIN in the last 168 hours. No results for input(s): LIPASE, AMYLASE in the last 168 hours. No results for input(s): AMMONIA in the last 168 hours.  CBC: Recent Labs  Lab 04/09/20 1205 04/12/20 0509  WBC 12.8* 7.2  HGB 8.9* 8.1*  HCT 29.0* 26.6*  MCV 97.6 95.7  PLT 680* 566*    Cardiac Enzymes: No results for  input(s): CKTOTAL, CKMB, CKMBINDEX, TROPONINI in the last 168 hours.  BNP (last 3 results) Recent Labs    04/08/20 0714  BNP 42.9    ProBNP (last 3 results) No results for input(s): PROBNP in the last 8760 hours.  Radiological Exams: CT ABDOMEN PELVIS W CONTRAST  Result Date: 04/12/2020 CLINICAL DATA:  Evaluate intra-abdominal abscess. Drain removal? EXAM: CT ABDOMEN AND PELVIS WITH CONTRAST TECHNIQUE: Multidetector CT imaging of the abdomen and pelvis was performed using the standard protocol following bolus administration of intravenous contrast. CONTRAST:  158mL OMNIPAQUE IOHEXOL 300 MG/ML  SOLN COMPARISON:  CT abdomen dated 04/01/2020 FINDINGS: Lower chest: Trace bibasilar pleural effusions. Emphysematous changes at the lung bases. Hepatobiliary: Small amount of fluid persists under the LEFT hepatic lobe, stable to slightly decreased compared to most recent CT of 04/01/2020, significantly decreased compared to an earlier CT of 03/19/2020. No acute or suspicious finding within the liver itself. Gallbladder is unremarkable. No bile duct dilatation seen. Pancreas: Unremarkable. No pancreatic ductal dilatation. Spleen: Normal in size without focal abnormality. Adrenals/Urinary Tract: Adrenal glands appear normal. Kidneys are unremarkable without mass, steroid proces. Bladder appears normal. Stomach/Bowel: Stable normal appearance of the LEFT abdominal wall colostomy. No dilated large or small bowel loops. Enteric tube in the stomach. Vascular/Lymphatic: No significant vascular findings are present. No enlarged abdominal or pelvic lymph nodes. Reproductive: Stable appearance. Other: Percutaneous drainage catheter with tip coiled posterior to the LEFT kidney,  stable positioning compared to the previous study 04/01/2020. The more anterior catheter has been removed. Fluid collections within the LEFT abdomen have decreased, nearly completely resolved. Small amount of free fluid remains in the abdomen and  pelvis. Musculoskeletal: No acute or suspicious osseous finding. Ill-defined fluid/edema throughout the subcutaneous soft tissues indicating anasarca. IMPRESSION: 1. Fluid collections within the LEFT abdomen have significantly decreased compared to previous CT exams, now nearly completely resolved. 2. Percutaneous drainage catheter with tip coiled posterior to the LEFT kidney, stable positioning compared to the previous study. The more anterior catheter has been removed. 3. Small amount of free fluid persists within the abdomen and pelvis. 4. Trace bibasilar pleural effusions. 5. Anasarca. 6. While reviewing today's study, comparing with a chest/abdomen/pelvis CT from earlier same month, there is question of thrombus in the LEFT internal jugular vein. Recommend ultrasound of the LEFT IJ to exclude DVT. This recommendation discussed with patient's hospitalist, Dr. Owens Shark, on 04/12/2020 at 4:20 p.m. Emphysema ([id number]). Electronically Signed   By: Franki Cabot M.D.   On: 04/12/2020 16:21    Assessment/Plan Active Problems:   Acute on chronic respiratory failure with hypoxia (HCC)   COPD, severe (HCC)   Perforation of sigmoid colon (HCC)   Acute deep vein thrombosis (DVT) of brachial vein of right upper extremity ([place])   Tracheostomy status ([place])   1. Acute on chronic respiratory failure hypoxia plan is to continue with pressure support titrate oxygen as tolerated and advance the weaning protocol 2. Severe COPD continue with medical management supportive care 3. Perforation of sigmoid colon status post resection 4. Acute DVT treated we will continue to follow 5. Tracheostomy remains in place we will continue present management   I have personally seen and evaluated the patient, evaluated laboratory and imaging results, formulated the assessment and plan and placed orders. The Patient requires high complexity decision making with multiple systems involvement.  Rounds were done with the  Respiratory Therapy Director and Staff therapists and discussed with nursing staff also.  Allyne Gee, MD [facility] Pulmonary Critical Care Medicine Sleep Medicine

## 2020-04-14 LAB — COMPREHENSIVE METABOLIC PANEL
ALT: 38 U/L (ref 0–44)
AST: 25 U/L (ref 15–41)
Albumin: 2.2 g/dL — ABNORMAL LOW (ref 3.5–5.0)
Alkaline Phosphatase: 104 U/L (ref 38–126)
Anion gap: 15 (ref 5–15)
BUN: 23 mg/dL (ref 8–23)
CO2: 27 mmol/L (ref 22–32)
Calcium: 8.3 mg/dL — ABNORMAL LOW (ref 8.9–10.3)
Chloride: 99 mmol/L (ref 98–111)
Creatinine, Ser: 0.67 mg/dL (ref 0.44–1.00)
GFR calc Af Amer: >60 mL/min (ref >60)
GFR calc non Af Amer: >60 mL/min (ref >60)
Glucose, Bld: 168 mg/dL — ABNORMAL HIGH (ref 70–99)
Potassium: 3.3 mmol/L — ABNORMAL LOW (ref 3.5–5.1)
Sodium: 141 mmol/L (ref 135–145)
Total Bilirubin: 0.3 mg/dL (ref 0.3–1.2)
Total Protein: 5.9 g/dL — ABNORMAL LOW (ref 6.5–8.1)

## 2020-04-14 LAB — CBC
HCT: 25.9 % — ABNORMAL LOW (ref 36.0–46.0)
Hemoglobin: 8 g/dL — ABNORMAL LOW (ref 12.0–15.0)
MCH: 29.5 pg (ref 26.0–34.0)
MCHC: 30.9 g/dL (ref 30.0–36.0)
MCV: 95.6 fL (ref 80.0–100.0)
Platelets: 461 10*3/uL — ABNORMAL HIGH (ref 150–400)
RBC: 2.71 MIL/uL — ABNORMAL LOW (ref 3.87–5.11)
RDW: 14.9 % (ref 11.5–15.5)
WBC: 8.9 10*3/uL (ref 4.0–10.5)
nRBC: 0 % (ref 0.0–0.2)

## 2020-04-14 NOTE — Progress Notes (Addendum)
Pulmonary Critical Care Medicine [place]   PULMONARY CRITICAL CARE SERVICE  PROGRESS NOTE  Date of Service: 04/14/2020  Tracy [name]  [id number]  DOB: [birth year]   DOA: 04/05/2020  Referring Physician: Merton Border, MD  HPI: Tracy [name] is a 69 y.o. female seen for follow up of Acute on Chronic Respiratory Failure.  She looks calm comfortable without distress this morning switched her over to pressure support and she had good tidal volumes noted  Medications: Reviewed on Rounds  Physical Exam:  Vitals: Temperature 97.4 pulse 97 respiratory 20 blood pressure is 152/70 saturations are 99%  Ventilator Settings on pressure support FiO2 28% pressure support 12 PEEP 5 tidal volume 569  . General: Comfortable at this time . Eyes: Grossly normal lids, irises & conjunctiva . ENT: grossly tongue is normal . Neck: no obvious mass . Cardiovascular: S1 S2 normal no gallop . Respiratory: No rhonchi coarse breath sounds are noted . Abdomen: soft . Skin: no rash seen on limited exam . Musculoskeletal: not rigid . Psychiatric:unable to assess . Neurologic: no seizure no involuntary movements         Lab Data:   Basic Metabolic Panel: Recent Labs  Lab 04/09/20 1205 04/12/20 0509 04/14/20 0615  NA 136 139 141  K 4.5 4.1 3.3*  CL 96* 99 99  CO2 29 29 27   GLUCOSE 129* 188* 168*  BUN 14 24* 23  CREATININE 0.50 0.45 0.67  CALCIUM 9.0 9.1 8.3*    ABG: No results for input(s): PHART, PCO2ART, PO2ART, HCO3, O2SAT in the last 168 hours.  Liver Function Tests: Recent Labs  Lab 04/14/20 0615  AST 25  ALT 38  ALKPHOS 104  BILITOT 0.3  PROT 5.9*  ALBUMIN 2.2*   No results for input(s): LIPASE, AMYLASE in the last 168 hours. No results for input(s): AMMONIA in the last 168 hours.  CBC: Recent Labs  Lab 04/09/20 1205 04/12/20 0509 04/14/20 0615  WBC 12.8* 7.2 8.9  HGB 8.9* 8.1* 8.0*  HCT 29.0* 26.6* 25.9*  MCV 97.6 95.7 95.6  PLT 680*  566* 461*    Cardiac Enzymes: No results for input(s): CKTOTAL, CKMB, CKMBINDEX, TROPONINI in the last 168 hours.  BNP (last 3 results) Recent Labs    04/08/20 0714  BNP 42.9    ProBNP (last 3 results) No results for input(s): PROBNP in the last 8760 hours.  Radiological Exams: CT ABDOMEN PELVIS W CONTRAST  Result Date: 04/12/2020 CLINICAL DATA:  Evaluate intra-abdominal abscess. Drain removal? EXAM: CT ABDOMEN AND PELVIS WITH CONTRAST TECHNIQUE: Multidetector CT imaging of the abdomen and pelvis was performed using the standard protocol following bolus administration of intravenous contrast. CONTRAST:  113mL OMNIPAQUE IOHEXOL 300 MG/ML  SOLN COMPARISON:  CT abdomen dated 04/01/2020 FINDINGS: Lower chest: Trace bibasilar pleural effusions. Emphysematous changes at the lung bases. Hepatobiliary: Small amount of fluid persists under the LEFT hepatic lobe, stable to slightly decreased compared to most recent CT of 04/01/2020, significantly decreased compared to an earlier CT of 03/19/2020. No acute or suspicious finding within the liver itself. Gallbladder is unremarkable. No bile duct dilatation seen. Pancreas: Unremarkable. No pancreatic ductal dilatation. Spleen: Normal in size without focal abnormality. Adrenals/Urinary Tract: Adrenal glands appear normal. Kidneys are unremarkable without mass, steroid proces. Bladder appears normal. Stomach/Bowel: Stable normal appearance of the LEFT abdominal wall colostomy. No dilated large or small bowel loops. Enteric tube in the stomach. Vascular/Lymphatic: No significant vascular findings are present. No enlarged abdominal or pelvic lymph  nodes. Reproductive: Stable appearance. Other: Percutaneous drainage catheter with tip coiled posterior to the LEFT kidney, stable positioning compared to the previous study 04/01/2020. The more anterior catheter has been removed. Fluid collections within the LEFT abdomen have decreased, nearly completely resolved. Small  amount of free fluid remains in the abdomen and pelvis. Musculoskeletal: No acute or suspicious osseous finding. Ill-defined fluid/edema throughout the subcutaneous soft tissues indicating anasarca. IMPRESSION: 1. Fluid collections within the LEFT abdomen have significantly decreased compared to previous CT exams, now nearly completely resolved. 2. Percutaneous drainage catheter with tip coiled posterior to the LEFT kidney, stable positioning compared to the previous study. The more anterior catheter has been removed. 3. Small amount of free fluid persists within the abdomen and pelvis. 4. Trace bibasilar pleural effusions. 5. Anasarca. 6. While reviewing today's study, comparing with a chest/abdomen/pelvis CT from earlier same month, there is question of thrombus in the LEFT internal jugular vein. Recommend ultrasound of the LEFT IJ to exclude DVT. This recommendation discussed with patient's hospitalist, Dr. Owens Shark, on 04/12/2020 at 4:20 p.m. Emphysema (ICD10-J43.9). Electronically Signed   By: Franki Cabot M.D.   On: 04/12/2020 16:21   VAS [name] UPPER EXTREMITY VENOUS DUPLEX  Result Date: 04/14/2020 UPPER VENOUS STUDY  Indications: Possible thrombus in the left Internal jugular vein by CT Limitations: Line, trach collar and bandages. Comparison Study: No prior left upper extremity venous duplex on file Performing Technologist: Sharion Dove RVS  Examination Guidelines: A complete evaluation includes B-mode imaging, spectral Doppler, color Doppler, and power Doppler as needed of all accessible portions of each vessel. Bilateral testing is considered an integral part of a complete examination. Limited examinations for reoccurring indications may be performed as noted.  Right Findings: +----------+------------+---------+-----------+----------+-------+ RIGHT     CompressiblePhasicitySpontaneousPropertiesSummary +----------+------------+---------+-----------+----------+-------+ Subclavian               Yes        Yes                      +----------+------------+---------+-----------+----------+-------+  Left Findings: +----------+------------+---------+-----------+----------+---------+ LEFT      CompressiblePhasicitySpontaneousProperties Summary  +----------+------------+---------+-----------+----------+---------+ IJV           Full       Yes       Yes                        +----------+------------+---------+-----------+----------+---------+ Subclavian    Full       Yes       Yes                        +----------+------------+---------+-----------+----------+---------+ Axillary      Full       Yes       Yes                        +----------+------------+---------+-----------+----------+---------+ Brachial                 Yes       Yes              PICC line +----------+------------+---------+-----------+----------+---------+ Cephalic                 Yes       Yes                        +----------+------------+---------+-----------+----------+---------+ Basilic  Yes       Yes                        +----------+------------+---------+-----------+----------+---------+  Summary:  Right: No evidence of thrombosis in the subclavian.  Left: No evidence of deep vein thrombosis in the upper extremity. No evidence of superficial vein thrombosis in the upper extremity.  *See table(s) above for measurements and observations.  Diagnosing physician: Monica Martinez MD Electronically signed by Monica Martinez MD on 04/14/2020 at 11:04:03 AM.    Final     Assessment/Plan Active Problems:   Acute on chronic respiratory failure with hypoxia (HCC)   COPD, severe ([place])   Perforation of sigmoid colon ([place])   Acute deep vein thrombosis (DVT) of brachial vein of right upper extremity ([place])   Tracheostomy status ([place])   1. Acute on chronic respiratory failure hypoxia patient continues on weaning protocol we will continue to advance hopefully try to park  today 2. Severe COPD at baseline continue present management 3. Perforation of sigmoid colon status post resection 4. Acute DVT treated we will continue to follow last ultrasound shows no evidence of DVT 5. Tracheostomy remains in place   I have personally seen and evaluated the patient, evaluated laboratory and imaging results, formulated the assessment and plan and placed orders. The Patient requires high complexity decision making with multiple systems involvement.  Rounds were done with the Respiratory Therapy Director and Staff therapists and discussed with nursing staff also.  Allyne Gee, MD [facility] Pulmonary Critical Care Medicine Sleep Medicine

## 2020-04-15 NOTE — Progress Notes (Signed)
Pulmonary Critical Care Medicine [place]   PULMONARY CRITICAL CARE SERVICE  PROGRESS NOTE  Date of Service: 04/15/2020  TRENESHA SPAGNOLI  [id number]  DOB: 01-06-1951   DOA: 04/05/2020  Referring Physician: Merton Border, MD  HPI: Tracy [name] is a 69 y.o. female seen for follow up of Acute on Chronic Respiratory Failure.  Patient is on pressure support this morning she was visibly anxious wanted her nerve medications.  I spoke with the nursing staff and they are going to go ahead and give her morning dose once this takes effect we will continue with advancing her weaning.  Medications: Reviewed on Rounds  Physical Exam:  Vitals: Temperature is 97.1 pulse 91 respiratory rate 19 blood pressure is 120/96 saturations 97%  Ventilator Settings on pressure support FiO2 28% tidal volume 460 pressure support 12 PEEP 5  . General: Comfortable at this time . Eyes: Grossly normal lids, irises & conjunctiva . ENT: grossly tongue is normal . Neck: no obvious mass . Cardiovascular: S1 S2 normal no gallop . Respiratory: Scattered coarse breath sounds with a few rhonchi . Abdomen: soft . Skin: no rash seen on limited exam . Musculoskeletal: not rigid . Psychiatric:unable to assess . Neurologic: no seizure no involuntary movements         Lab Data:   Basic Metabolic Panel: Recent Labs  Lab 04/09/20 1205 04/12/20 0509 04/14/20 0615  NA 136 139 141  K 4.5 4.1 3.3*  CL 96* 99 99  CO2 29 29 27   GLUCOSE 129* 188* 168*  BUN 14 24* 23  CREATININE 0.50 0.45 0.67  CALCIUM 9.0 9.1 8.3*    ABG: No results for input(s): PHART, PCO2ART, PO2ART, HCO3, O2SAT in the last 168 hours.  Liver Function Tests: Recent Labs  Lab 04/14/20 0615  AST 25  ALT 38  ALKPHOS 104  BILITOT 0.3  PROT 5.9*  ALBUMIN 2.2*   No results for input(s): LIPASE, AMYLASE in the last 168 hours. No results for input(s): AMMONIA in the last 168 hours.  CBC: Recent Labs  Lab  04/09/20 1205 04/12/20 0509 04/14/20 0615  WBC 12.8* 7.2 8.9  HGB 8.9* 8.1* 8.0*  HCT 29.0* 26.6* 25.9*  MCV 97.6 95.7 95.6  PLT 680* 566* 461*    Cardiac Enzymes: No results for input(s): CKTOTAL, CKMB, CKMBINDEX, TROPONINI in the last 168 hours.  BNP (last 3 results) Recent Labs    04/08/20 0714  BNP 42.9    ProBNP (last 3 results) No results for input(s): PROBNP in the last 8760 hours.  Radiological Exams: VAS [name] UPPER EXTREMITY VENOUS DUPLEX  Result Date: 04/14/2020 UPPER VENOUS STUDY  Indications: Possible thrombus in the left Internal jugular vein by CT Limitations: Line, trach collar and bandages. Comparison Study: No prior left upper extremity venous duplex on file Performing Technologist: Sharion Dove RVS  Examination Guidelines: A complete evaluation includes B-mode imaging, spectral Doppler, color Doppler, and power Doppler as needed of all accessible portions of each vessel. Bilateral testing is considered an integral part of a complete examination. Limited examinations for reoccurring indications may be performed as noted.  Right Findings: +----------+------------+---------+-----------+----------+-------+ RIGHT     CompressiblePhasicitySpontaneousPropertiesSummary +----------+------------+---------+-----------+----------+-------+ Subclavian               Yes       Yes                      +----------+------------+---------+-----------+----------+-------+  Left Findings: +----------+------------+---------+-----------+----------+---------+ LEFT  CompressiblePhasicitySpontaneousProperties Summary  +----------+------------+---------+-----------+----------+---------+ IJV           Full       Yes       Yes                        +----------+------------+---------+-----------+----------+---------+ Subclavian    Full       Yes       Yes                        +----------+------------+---------+-----------+----------+---------+ Axillary       Full       Yes       Yes                        +----------+------------+---------+-----------+----------+---------+ Brachial                 Yes       Yes              PICC line +----------+------------+---------+-----------+----------+---------+ Cephalic                 Yes       Yes                        +----------+------------+---------+-----------+----------+---------+ Basilic                  Yes       Yes                        +----------+------------+---------+-----------+----------+---------+  Summary:  Right: No evidence of thrombosis in the subclavian.  Left: No evidence of deep vein thrombosis in the upper extremity. No evidence of superficial vein thrombosis in the upper extremity.  *See table(s) above for measurements and observations.  Diagnosing physician: Monica Martinez MD Electronically signed by Monica Martinez MD on 04/14/2020 at 11:04:03 AM.    Final     Assessment/Plan Active Problems:   Acute on chronic respiratory failure with hypoxia (HCC)   COPD, severe ([place])   Perforation of sigmoid colon ([place])   Acute deep vein thrombosis (DVT) of brachial vein of right upper extremity ([place])   Tracheostomy status ([place])   1. Acute on chronic respiratory failure with hypoxia we will continue with advancing the wean once she has gotten her nerve medication 2. Severe COPD medical management 3. Perforation of sigmoid colon status post resection 4. Acute DVT treated 5. Tracheostomy continue with supportive care monitor   I have personally seen and evaluated the patient, evaluated laboratory and imaging results, formulated the assessment and plan and placed orders. The Patient requires high complexity decision making with multiple systems involvement.  Rounds were done with the Respiratory Therapy Director and Staff therapists and discussed with nursing staff also.  Allyne Gee, MD [facility] Pulmonary Critical Care Medicine Sleep Medicine

## 2020-04-15 NOTE — Progress Notes (Signed)
Second LLQ JP drain removed today. Drain placed 03/20/2020 for sigmoid perforation. CT scan on 04/12/20 showed resolution of fluid collection. Per Dr. Vernard Gambles with IR and Saverio Danker, PA with surgery, ok to remove drain.   Patient was alert and oriented this morning upon assessment, aware and supportive of plan to remove drain. Patient able to participate in her care by pulling herself over to allow [name] better access to her drain site.   Sutures removed, drain tubing cut, drain pulled. All sutures removed. No blood loss. Skin around drain insertion site was clean, dry and without redness, warmth or swelling. Gauze/tape dressing applied. Note made in [facility] paper chart.   Soyla Dryer, NP Interventional Radiology

## 2020-04-16 NOTE — Progress Notes (Signed)
Pulmonary Critical Care Medicine [place]   PULMONARY CRITICAL CARE SERVICE  PROGRESS NOTE  Date of Service: 04/16/2020  Tracy [name]  [id number]  DOB: 03/23/1951   DOA: 04/05/2020  Referring Physician: Merton Border, MD  HPI: Tracy [name] is a 69 y.o. female seen for follow up of Acute on Chronic Respiratory Failure.  Currently patient is on collar has been on 28% FiO2 tolerating PMV today the goal for the T collar is about 8 hours  Medications: Reviewed on Rounds  Physical Exam:  Vitals: Temperature 97.3 pulse 83 respiratory rate is 16 blood pressure 154/60 saturations 100%  Ventilator Settings on T collar FiO2 28%  . General: Comfortable at this time . Eyes: Grossly normal lids, irises & conjunctiva . ENT: grossly tongue is normal . Neck: no obvious mass . Cardiovascular: S1 S2 normal no gallop . Respiratory: No rhonchi no rales noted . Abdomen: soft . Skin: no rash seen on limited exam . Musculoskeletal: not rigid . Psychiatric:unable to assess . Neurologic: no seizure no involuntary movements         Lab Data:   Basic Metabolic Panel: Recent Labs  Lab 04/12/20 0509 04/14/20 0615  NA 139 141  K 4.1 3.3*  CL 99 99  CO2 29 27  GLUCOSE 188* 168*  BUN 24* 23  CREATININE 0.45 0.67  CALCIUM 9.1 8.3*    ABG: No results for input(s): PHART, PCO2ART, PO2ART, HCO3, O2SAT in the last 168 hours.  Liver Function Tests: Recent Labs  Lab 04/14/20 0615  AST 25  ALT 38  ALKPHOS 104  BILITOT 0.3  PROT 5.9*  ALBUMIN 2.2*   No results for input(s): LIPASE, AMYLASE in the last 168 hours. No results for input(s): AMMONIA in the last 168 hours.  CBC: Recent Labs  Lab 04/12/20 0509 04/14/20 0615  WBC 7.2 8.9  HGB 8.1* 8.0*  HCT 26.6* 25.9*  MCV 95.7 95.6  PLT 566* 461*    Cardiac Enzymes: No results for input(s): CKTOTAL, CKMB, CKMBINDEX, TROPONINI in the last 168 hours.  BNP (last 3 results) Recent Labs     04/08/20 0714  BNP 42.9    ProBNP (last 3 results) No results for input(s): PROBNP in the last 8760 hours.  Radiological Exams: No results found.  Assessment/Plan Active Problems:   Acute on chronic respiratory failure with hypoxia (HCC)   COPD, severe (HCC)   Perforation of sigmoid colon (HCC)   Acute deep vein thrombosis (DVT) of brachial vein of right upper extremity ([place])   Tracheostomy status ([place])   1. Acute on chronic respiratory failure hypoxia we will continue with the wound protocol goal of 8 hours on the PMV on T collar 2. Severe COPD medical management 3. Severe anxiety better controlled 4. Sigmoid perforation status post resection 5. Acute DVT treated 6. Tracheostomy working on [place]   I have personally seen and evaluated the patient, evaluated laboratory and imaging results, formulated the assessment and plan and placed orders. The Patient requires high complexity decision making with multiple systems involvement.  Rounds were done with the Respiratory Therapy Director and Staff therapists and discussed with nursing staff also.  Allyne Gee, MD [facility] Pulmonary Critical Care Medicine Sleep Medicine

## 2020-04-17 LAB — BASIC METABOLIC PANEL
Anion gap: 10 (ref 5–15)
BUN: 29 mg/dL — ABNORMAL HIGH (ref 8–23)
CO2: 28 mmol/L (ref 22–32)
Calcium: 8.6 mg/dL — ABNORMAL LOW (ref 8.9–10.3)
Chloride: 99 mmol/L (ref 98–111)
Creatinine, Ser: 0.62 mg/dL (ref 0.44–1.00)
GFR calc Af Amer: >60 mL/min (ref >60)
GFR calc non Af Amer: >60 mL/min (ref >60)
Glucose, Bld: 198 mg/dL — ABNORMAL HIGH (ref 70–99)
Potassium: 4 mmol/L (ref 3.5–5.1)
Sodium: 137 mmol/L (ref 135–145)

## 2020-04-17 LAB — CBC
HCT: 31.2 % — ABNORMAL LOW (ref 36.0–46.0)
Hemoglobin: 9.6 g/dL — ABNORMAL LOW (ref 12.0–15.0)
MCH: 29.8 pg (ref 26.0–34.0)
MCHC: 30.8 g/dL (ref 30.0–36.0)
MCV: 96.9 fL (ref 80.0–100.0)
Platelets: 420 10*3/uL — ABNORMAL HIGH (ref 150–400)
RBC: 3.22 MIL/uL — ABNORMAL LOW (ref 3.87–5.11)
RDW: 15 % (ref 11.5–15.5)
WBC: 9.4 10*3/uL (ref 4.0–10.5)
nRBC: 0 % (ref 0.0–0.2)

## 2020-04-17 NOTE — Progress Notes (Addendum)
Pulmonary Critical Care Medicine [place] [name]   PULMONARY CRITICAL CARE SERVICE  PROGRESS NOTE  Date of Service: 04/17/2020  WESTON LOAIZA  [id number]  DOB: [birth year]   DOA: 04/05/2020  Referring Physician: Merton Border, MD  HPI: LASHANTI [name] is a 69 y.o. female seen for follow up of Acute on Chronic Respiratory Failure.  Patient remains on 28% aerosol trach collar at this time for 16-hour goal today.  Satting well  Medications: Reviewed on Rounds  Physical Exam:  Vitals: Pulse 89 respirations 13 BP 133/64 O2 sat 96% temp 96.6  Ventilator Settings 28% ATC  . General: Comfortable at this time . Eyes: Grossly normal lids, irises & conjunctiva . ENT: grossly tongue is normal . Neck: no obvious mass . Cardiovascular: S1 S2 normal no gallop . Respiratory: No rales or rhonchi noted . Abdomen: soft . Skin: no rash seen on limited exam . Musculoskeletal: not rigid . Psychiatric:unable to assess . Neurologic: no seizure no involuntary movements         Lab Data:   Basic Metabolic Panel: Recent Labs  Lab 04/12/20 0509 04/14/20 0615 04/17/20 0708  NA 139 141 137  K 4.1 3.3* 4.0  CL 99 99 99  CO2 29 27 28   GLUCOSE 188* 168* 198*  BUN 24* 23 29*  CREATININE 0.45 0.67 0.62  CALCIUM 9.1 8.3* 8.6*    ABG: No results for input(s): PHART, PCO2ART, PO2ART, HCO3, O2SAT in the last 168 hours.  Liver Function Tests: Recent Labs  Lab 04/14/20 0615  AST 25  ALT 38  ALKPHOS 104  BILITOT 0.3  PROT 5.9*  ALBUMIN 2.2*   No results for input(s): LIPASE, AMYLASE in the last 168 hours. No results for input(s): AMMONIA in the last 168 hours.  CBC: Recent Labs  Lab 04/12/20 0509 04/14/20 0615 04/17/20 0708  WBC 7.2 8.9 9.4  HGB 8.1* 8.0* 9.6*  HCT 26.6* 25.9* 31.2*  MCV 95.7 95.6 96.9  PLT 566* 461* 420*    Cardiac Enzymes: No results for input(s): CKTOTAL, CKMB, CKMBINDEX, TROPONINI in the last 168 hours.  BNP (last 3 results) Recent  Labs    04/08/20 0714  BNP 42.9    ProBNP (last 3 results) No results for input(s): PROBNP in the last 8760 hours.  Radiological Exams: No results found.  Assessment/Plan Active Problems:   Acute on chronic respiratory failure with hypoxia (HCC)   COPD, severe (HCC)   Perforation of sigmoid colon (HCC)   Acute deep vein thrombosis (DVT) of brachial vein of right upper extremity ([place])   Tracheostomy status ([place])   1. Acute on chronic respiratory failure hypoxia patient will continue with aerosol trach collar 28% O2 for 16-hour goal.  Continue to attempt weaning per protocol.  Will continue aggressive pulmonary toilet supportive measures. 2. Severe COPD medical management 3. Severe anxiety better controlled 4. Sigmoid perforation status post resection 5. Acute DVT treated 6. Tracheostomy working on [place]   I have personally seen and evaluated the patient, evaluated laboratory and imaging results, formulated the assessment and plan and placed orders. The Patient requires high complexity decision making with multiple systems involvement.  Rounds were done with the Respiratory Therapy Director and Staff therapists and discussed with nursing staff also.  Allyne Gee, MD [facility] Pulmonary Critical Care Medicine Sleep Medicine

## 2020-04-18 NOTE — Consult Note (Signed)
Infectious Disease Consultation   Tracy [name]  [id number]  DOB: 1951/03/17  DOA: 04/05/2020   Requesting physician: Dr. Laren Everts  Reason for consultation: Antibiotic recommendations   History of Present Illness: Tracy [name] is an 69 y.o. female with history of COPD, vitamin D deficiency was admitted to the acute facility on 03/03/2020 with worsening abdominal pain.  Patient was found to have a perforation which required emergent bowel resection.  Her hospital stay was complicated by septic shock, acute renal failure, right arm DVT.  She also required a second surgery for ischemic bowel.  On postoperative day 5 she also developed respiratory failure and required intubation.  She was unable to be weaned off the vent therefore had to undergo tracheostomy.  [facility] due to her complex medical problems.  Apparently pathology from her resection showed rectal cancer with 3/15+ lymph nodes.  Patient at this time is complaining of some abdominal pain.  Denies having any chest pain, shortness of breath, nausea, vomiting, dysuria.  She has ostomy.  She also has a Dobbhoff tube in place.  She has been on treatment with IV Zosyn.  Review of Systems:  Review of system negative except as mentioned above in the HPI.   Past Medical History: Past Medical History:  Diagnosis Date  . Acute deep vein thrombosis (DVT) of brachial vein of right upper extremity ([place])   . Acute on chronic respiratory failure with hypoxia ([place])   . Asthma   . COPD (chronic obstructive pulmonary disease) ([place]) 2011   FeV1 31% predicted FeV1/FVX 47 %  . COPD, severe ([place])   . Hemorrhoid   . Osteopenia   . Perforation of sigmoid colon ([place])   . Seasonal allergies    takes Claritin daily prn  . Tracheostomy status ([place])   . Vitamin D deficiency    takes Vit d every 14 days    Past Surgical History: Past Surgical History:  Procedure Laterality Date  . AUGMENTATION  MAMMAPLASTY     saline  . BREAST ENHANCEMENT SURGERY    . EXAMINATION UNDER ANESTHESIA  10/14/2012   Procedure: EXAM UNDER ANESTHESIA;  Surgeon: Gayland Curry, MD,FACS;  Location: [facility];  Service: General;  Laterality: N/A;  rectal exam under anesthesia excisional hemorrhoidectomy hemorrhoidal banding x two  . EXAMINATION UNDER ANESTHESIA  02/03/2013   excision hemorrhoidal tissue  . FOOT SURGERY     left bunionectomy  . HEMORRHOIDECTOMY WITH HEMORRHOID BANDING  10/14/2012   Procedure: [id number] WITH HEMORRHOID BANDING;  Surgeon: Gayland Curry, MD,FACS;  Location: [facility];  Service: General;  Laterality: N/A;  . LAPAROTOMY N/A 03/03/2020   Procedure: low anterior resection end colostomy;  Surgeon: [name], MD;  Location: [facility];  Service: General;  Laterality: N/A;  . LAPAROTOMY N/A 03/12/2020   Procedure: EXPLORATORY LAPAROTOMY WITH BOWEL RESECTION AND COLOSTOMY;  Surgeon: [name], MD;  Location[facility];  Service: General;  Laterality: N/A;  . OPEN REDUCTION INTERNAL FIXATION (ORIF) DISTAL RADIAL FRACTURE Right 11/01/2018   Procedure: OPEN REDUCTION INTERNAL FIXATION (ORIF) DISTAL RADIAL FRACTURE;  Surgeon: [name], MD;  Location: [facility];  Service: Orthopedics;  Laterality: Right;  . TONSILLECTOMY  age 34    recurrent otitis media     Allergies:   Allergies  Allergen Reactions  . Codeine Swelling     Social History:  reports that she quit smoking about 20 years  ago. Her smoking use included cigarettes. She has a 10.00 pack-year smoking history. She has never used smokeless tobacco. She reports current alcohol use. She reports that she does not use drugs.   Family History: Family History  Problem Relation Age of Onset  . Osteoporosis Mother   . Other Father        [place] in snow, broke ankle, blood clot to lungs  . Heart murmur Sister   . Other Brother        h/o being hit by lightening  . Parkinsonism Brother   . Asthma Son   .  Allergies Son   . Otitis media Son   . Other Sister        CML  . Asthma Son   . Allergies Son   . Pancreatic cancer Brother        diag 04-Jan-2014-deceased 2014/03/06    Physical Exam: Vitals: Temperature 97.6, pulse 62, respiratory 19, blood pressure 130/82, pulse oximetry 96%  Constitutional: Thin appearing female, awake and oriented Eyes: PERLA, EOMI, irises appear normal, anicteric sclera  ENMT: external ears and nose appear normal, normal hearing, Lips appears normal, moist oropharynx mucosa, has Dobbhoff tube Neck: Trach in place, supple. CVS: S1-S2 clear, no murmur   Respiratory: Decreased breath sound lower lobes otherwise clear to auscultation Abdomen: Abdominal wound with dressing in place, colostomy, positive bowel sounds Musculoskeletal: Trace lower extremity edema Neuro: Cranial nerves II-XII intact Psych: judgement and insight appear normal, stable mood and affect, mental status Skin: no rashes  Data reviewed:  I have personally reviewed following labs and imaging studies Labs:  CBC: Recent Labs  Lab 04/12/20 0509 04/14/20 0615 04/17/20 0708  WBC 7.2 8.9 9.4  HGB 8.1* 8.0* 9.6*  HCT 26.6* 25.9* 31.2*  MCV 95.7 95.6 96.9  PLT 566* 461* 420*    Basic Metabolic Panel: Recent Labs  Lab 04/12/20 0509 04/12/20 0509 04/14/20 0615 04/17/20 0708  NA 139  --  141 137  K 4.1   < > 3.3* 4.0  CL 99  --  99 99  CO2 29  --  27 28  GLUCOSE 188*  --  168* 198*  BUN 24*  --  23 29*  CREATININE 0.45  --  0.67 0.62  CALCIUM 9.1  --  8.3* 8.6*   < > = values in this interval not displayed.   GFR CrCl cannot be calculated (Unknown ideal weight.). Liver Function Tests: Recent Labs  Lab 04/14/20 0615  AST 25  ALT 38  ALKPHOS 104  BILITOT 0.3  PROT 5.9*  ALBUMIN 2.2*   No results for input(s): LIPASE, AMYLASE in the last 168 hours. No results for input(s): AMMONIA in the last 168 hours. Coagulation profile No results for input(s): INR, PROTIME in the last 168  hours.  Cardiac Enzymes: No results for input(s): CKTOTAL, CKMB, CKMBINDEX, TROPONINI in the last 168 hours. BNP: Invalid input(s): POCBNP CBG: No results for input(s): GLUCAP in the last 168 hours. D-Dimer No results for input(s): DDIMER in the last 72 hours. Hgb A1c No results for input(s): HGBA1C in the last 72 hours. Lipid Profile No results for input(s): CHOL, HDL, LDLCALC, TRIG, CHOLHDL, LDLDIRECT in the last 72 hours. Thyroid function studies No results for input(s): TSH, T4TOTAL, T3FREE, THYROIDAB in the last 72 hours.  Invalid input(s): FREET3 Anemia work up No results for input(s): VITAMINB12, FOLATE, FERRITIN, TIBC, IRON, RETICCTPCT in the last 72 hours. Urinalysis    Component Value Date/Time   COLORURINE YELLOW  03/08/2020 1831   APPEARANCEUR HAZY (A) 03/08/2020 1831   LABSPEC >1.046 (H) 03/08/2020 1831   PHURINE 5.0 03/08/2020 1831   GLUCOSEU NEGATIVE 03/08/2020 1831   HGBUR SMALL (A) 03/08/2020 1831   BILIRUBINUR NEGATIVE 03/08/2020 1831   BILIRUBINUR n 03/05/2014 1355   KETONESUR 20 (A) 03/08/2020 1831   PROTEINUR 30 (A) 03/08/2020 1831   UROBILINOGEN negative 03/05/2014 1355   NITRITE NEGATIVE 03/08/2020 1831   LEUKOCYTESUR NEGATIVE 03/08/2020 1831     Microbiology No results found for this or any previous visit (from the past 240 hour(s)).     Inpatient Medications:   Please see MAR  Radiological Exams on Admission: No results found.  Impression/Recommendations Active Problems:   Acute on chronic respiratory failure with hypoxia (HCC)   COPD, severe (HCC)   Perforation of sigmoid colon (HCC)   Acute deep vein thrombosis (DVT) of brachial vein of right upper extremity (HCC)   Tracheostomy status (HCC) Intra-abdominal abscess Abdominal wound status post surgery Sepsis, resolved Rectal cancer Dysphagia  Acute on chronic respiratory failure with hypoxemia: Multifactorial.  She has severe COPD and then had a worsening respiratory failure  after abdominal surgery for gastric perforation.  Pulmonary following.  Further management per primary team and pulmonary.  Intra-abdominal abscess: Patient presented with sigmoid colon perforation and underwent surgery.  Subsequently has abdominal wound, ostomy, Intra-abdominal abscess.  She had CT of the abdomen and pelvis with contrast done on 04/12/2020 that showed that the fluid collections within the left abdomen significantly decreased compared to prior exams.  Currently on treatment with IV Zosyn.  Would recommend to continue treatment for another week and repeat CT of the abdomen and pelvis.  If she continues to have fluid collections then would recommend to switch to Cipro and Flagyl and treat for 1 week.  Sepsis: Patient reportedly had history of Pseudomonas on the cultures from the outside facility.  She also had sepsis likely intra-abdominal source from the intra-abdominal abscess after she presented with abdominal perforation.  She is on Zosyn.  Currently sepsis has resolved.  However, she is very high risk for recurrent sepsis.  If she starts having any worsening fevers, worsening leukocytosis would recommend to send for pan cultures.  Acute DVT of the right upper extremity brachial vein: Continue medication and management per the primary team.  Eliquis per the primary team.  Dysphagia: She currently has a Dobbhoff tube in place.  Further management per the primary team.  She is at risk for aspiration and aspiration pneumonia.  Rectal cancer: Patient apparently has newly diagnosed rectal cancer.  Needs follow-up after discharge once her acute issues have resolved.  Due to her complex medical problems she is high risk for worsening and decompensation.  Plan of care discussed with the patient, primary team and pharmacy.  Thank you for this consultation.    Yaakov Guthrie M.D. 04/18/2020, 6:54 PM

## 2020-04-18 NOTE — Progress Notes (Signed)
Pulmonary Critical Care Medicine [place]   PULMONARY CRITICAL CARE SERVICE  PROGRESS NOTE  Date of Service: 04/18/2020  Tracy [name]  [id number]  DOB: 07-06-1951   DOA: 04/05/2020  Referring Physician: Merton Border, MD  HPI: Tracy [name] is a 69 y.o. female seen for follow up of Acute on Chronic Respiratory Failure.  Patient right now is on T collar has been on 28% FiO2 with goal greater than 24 hours  Medications: Reviewed on Rounds  Physical Exam:  Vitals: Temperature is 97.8 pulse 83 respiratory 23 blood pressure is 142/79 saturations 92%  Ventilator Settings on T collar with an FiO2 28%  . General: Comfortable at this time . Eyes: Grossly normal lids, irises & conjunctiva . ENT: grossly tongue is normal . Neck: no obvious mass . Cardiovascular: S1 S2 normal no gallop . Respiratory: No rhonchi coarse breath sounds . Abdomen: soft . Skin: no rash seen on limited exam . Musculoskeletal: not rigid . Psychiatric:unable to assess . Neurologic: no seizure no involuntary movements         Lab Data:   Basic Metabolic Panel: Recent Labs  Lab 04/12/20 0509 04/14/20 0615 04/17/20 0708  NA 139 141 137  K 4.1 3.3* 4.0  CL 99 99 99  CO2 29 27 28   GLUCOSE 188* 168* 198*  BUN 24* 23 29*  CREATININE 0.45 0.67 0.62  CALCIUM 9.1 8.3* 8.6*    ABG: No results for input(s): PHART, PCO2ART, PO2ART, HCO3, O2SAT in the last 168 hours.  Liver Function Tests: Recent Labs  Lab 04/14/20 0615  AST 25  ALT 38  ALKPHOS 104  BILITOT 0.3  PROT 5.9*  ALBUMIN 2.2*   No results for input(s): LIPASE, AMYLASE in the last 168 hours. No results for input(s): AMMONIA in the last 168 hours.  CBC: Recent Labs  Lab 04/12/20 0509 04/14/20 0615 04/17/20 0708  WBC 7.2 8.9 9.4  HGB 8.1* 8.0* 9.6*  HCT 26.6* 25.9* 31.2*  MCV 95.7 95.6 96.9  PLT 566* 461* 420*    Cardiac Enzymes: No results for input(s): CKTOTAL, CKMB, CKMBINDEX, TROPONINI in the  last 168 hours.  BNP (last 3 results) Recent Labs    04/08/20 0714  BNP 42.9    ProBNP (last 3 results) No results for input(s): PROBNP in the last 8760 hours.  Radiological Exams: No results found.  Assessment/Plan Active Problems:   Acute on chronic respiratory failure with hypoxia (HCC)   COPD, severe (HCC)   Perforation of sigmoid colon (HCC)   Acute deep vein thrombosis (DVT) of brachial vein of right upper extremity ([place])   Tracheostomy status ([place])   1. Acute on chronic respiratory failure hypoxia we will continue with T collar trials patient currently is on 28% FiO2 good saturations are noted at this time. 2. Severe COPD at baseline we will continue to follow 3. Perforation of sigmoid no change supportive care 4. Acute DVT treated clinically improving 5. Tracheostomy remains in place   I have personally seen and evaluated the patient, evaluated laboratory and imaging results, formulated the assessment and plan and placed orders. The Patient requires high complexity decision making with multiple systems involvement.  Rounds were done with the Respiratory Therapy Director and Staff therapists and discussed with nursing staff also.  Allyne Gee, MD [facility] Pulmonary Critical Care Medicine Sleep Medicine

## 2020-04-19 NOTE — Progress Notes (Addendum)
Pulmonary Critical Care Medicine [place]   PULMONARY CRITICAL CARE SERVICE  PROGRESS NOTE  Date of Service: 04/19/2020  Tracy Tracy [name]  [id number]  DOB: 05/26/1951   DOA: 04/05/2020  Referring Physician: Merton Border, MD  HPI: Tracy Tracy [name] is a 69 y.o. female seen for follow up of Acute on Chronic Respiratory Failure.  Patient has been on ATC for 48 hours.  28% FIo2.    Medications: Reviewed on Rounds  Physical Exam:  Vitals: Pulse 55, resp 18, bp 139/78, o2 sat 100%, temp 96.8  Ventilator Settings 28% ATC  . General: Comfortable at this time . Eyes: Grossly normal lids, irises & conjunctiva . ENT: grossly tongue is normal . Neck: no obvious mass . Cardiovascular: S1 S2 normal no gallop . Respiratory: no rales or ronchi noted. . Abdomen: soft . Skin: no rash seen on limited exam . Musculoskeletal: not rigid . Psychiatric:unable to assess . Neurologic: no seizure no involuntary movements         Lab Data:   Basic Metabolic Panel: Recent Labs  Lab 04/14/20 0615 04/17/20 0708  NA 141 137  K 3.3* 4.0  CL 99 99  CO2 27 28  GLUCOSE 168* 198*  BUN 23 29*  CREATININE 0.67 0.62  CALCIUM 8.3* 8.6*    ABG: No results for input(s): PHART, PCO2ART, PO2ART, HCO3, O2SAT in the last 168 hours.  Liver Function Tests: Recent Labs  Lab 04/14/20 0615  AST 25  ALT 38  ALKPHOS 104  BILITOT 0.3  PROT 5.9*  ALBUMIN 2.2*   No results for input(s): LIPASE, AMYLASE in the last 168 hours. No results for input(s): AMMONIA in the last 168 hours.  CBC: Recent Labs  Lab 04/14/20 0615 04/17/20 0708  WBC 8.9 9.4  HGB 8.0* 9.6*  HCT 25.9* 31.2*  MCV 95.6 96.9  PLT 461* 420*    Cardiac Enzymes: No results for input(s): CKTOTAL, CKMB, CKMBINDEX, TROPONINI in the last 168 hours.  BNP (last 3 results) Recent Labs    04/08/20 0714  BNP 42.9    ProBNP (last 3 results) No results for input(s): PROBNP in the last 8760  hours.  Radiological Exams: No results found.  Assessment/Plan Active Problems:   Acute on chronic respiratory failure with hypoxia (HCC)   COPD, severe (HCC)   Perforation of sigmoid colon (HCC)   Acute deep vein thrombosis (DVT) of brachial vein of right upper extremity ([name])   Tracheostomy status ([name])   1. Acute on chronic respiratory failure hypoxia  He has a 48 hour goal on ATC 28%.  Continue with pulmonary toilet.  2. Severe COPD at baseline we will continue to follow 3. Perforation of sigmoid no change supportive care 4. Acute DVT treated clinically improving 5. Tracheostomy remains in place   I have personally seen and evaluated the patient, evaluated laboratory and imaging results, formulated the assessment and plan and placed orders. The Patient requires high complexity decision making with multiple systems involvement.  Rounds were done with the Respiratory Therapy Director and Staff therapists and discussed with nursing staff also.  Allyne Gee, MD [facility] Pulmonary Critical Care Medicine Sleep Medicine

## 2020-04-20 LAB — CBC
HCT: 35.1 % — ABNORMAL LOW (ref 36.0–46.0)
Hemoglobin: 11.3 g/dL — ABNORMAL LOW (ref 12.0–15.0)
MCH: 30.1 pg (ref 26.0–34.0)
MCHC: 32.2 g/dL (ref 30.0–36.0)
MCV: 93.6 fL (ref 80.0–100.0)
Platelets: 417 10*3/uL — ABNORMAL HIGH (ref 150–400)
RBC: 3.75 MIL/uL — ABNORMAL LOW (ref 3.87–5.11)
RDW: 14.9 % (ref 11.5–15.5)
WBC: 13.4 10*3/uL — ABNORMAL HIGH (ref 4.0–10.5)
nRBC: 0 % (ref 0.0–0.2)

## 2020-04-20 LAB — BASIC METABOLIC PANEL
Anion gap: 10 (ref 5–15)
BUN: 23 mg/dL (ref 8–23)
CO2: 29 mmol/L (ref 22–32)
Calcium: 8.9 mg/dL (ref 8.9–10.3)
Chloride: 97 mmol/L — ABNORMAL LOW (ref 98–111)
Creatinine, Ser: 0.62 mg/dL (ref 0.44–1.00)
GFR calc Af Amer: >60 mL/min (ref >60)
GFR calc non Af Amer: >60 mL/min (ref >60)
Glucose, Bld: 178 mg/dL — ABNORMAL HIGH (ref 70–99)
Potassium: 2.9 mmol/L — ABNORMAL LOW (ref 3.5–5.1)
Sodium: 136 mmol/L (ref 135–145)

## 2020-04-20 NOTE — Progress Notes (Signed)
Pulmonary Critical Care Medicine [place]   PULMONARY CRITICAL CARE SERVICE  PROGRESS NOTE  Date of Service: 04/20/2020  Tracy [name]  [id number]  DOB: [birth year]   DOA: 04/05/2020  Referring Physician: Merton Border, MD  HPI: Tracy [name] is a 69 y.o. female seen for follow up of Acute on Chronic Respiratory Failure.  Patient currently is on T collar has been on 28% FiO2 using PMV good saturations are noted  Medications: Reviewed on Rounds  Physical Exam:  Vitals: Temperature is 97.6 pulse 84 respiratory rate 15 blood pressure is 157/81 saturations 96%  Ventilator Settings on T collar with an FiO2 28% using PMV  . General: Comfortable at this time . Eyes: Grossly normal lids, irises & conjunctiva . ENT: grossly tongue is normal . Neck: no obvious mass . Cardiovascular: S1 S2 normal no gallop . Respiratory: Coarse breath sounds scattered rhonchi . Abdomen: soft . Skin: no rash seen on limited exam . Musculoskeletal: not rigid . Psychiatric:unable to assess . Neurologic: no seizure no involuntary movements         Lab Data:   Basic Metabolic Panel: Recent Labs  Lab 04/14/20 0615 04/17/20 0708 04/20/20 1024  NA 141 137 136  K 3.3* 4.0 2.9*  CL 99 99 97*  CO2 27 28 29   GLUCOSE 168* 198* 178*  BUN 23 29* 23  CREATININE 0.67 0.62 0.62  CALCIUM 8.3* 8.6* 8.9    ABG: No results for input(s): PHART, PCO2ART, PO2ART, HCO3, O2SAT in the last 168 hours.  Liver Function Tests: Recent Labs  Lab 04/14/20 0615  AST 25  ALT 38  ALKPHOS 104  BILITOT 0.3  PROT 5.9*  ALBUMIN 2.2*   No results for input(s): LIPASE, AMYLASE in the last 168 hours. No results for input(s): AMMONIA in the last 168 hours.  CBC: Recent Labs  Lab 04/14/20 0615 04/17/20 0708 04/20/20 1024  WBC 8.9 9.4 13.4*  HGB 8.0* 9.6* 11.3*  HCT 25.9* 31.2* 35.1*  MCV 95.6 96.9 93.6  PLT 461* 420* 417*    Cardiac Enzymes: No results for input(s): CKTOTAL,  CKMB, CKMBINDEX, TROPONINI in the last 168 hours.  BNP (last 3 results) Recent Labs    04/08/20 0714  BNP 42.9    ProBNP (last 3 results) No results for input(s): PROBNP in the last 8760 hours.  Radiological Exams: No results found.  Assessment/Plan Active Problems:   Acute on chronic respiratory failure with hypoxia (HCC)   COPD, severe (HCC)   Perforation of sigmoid colon (HCC)   Acute deep vein thrombosis (DVT) of brachial vein of right upper extremity ([place])   Tracheostomy status ([place])   1. Acute on chronic respiratory failure with hypoxia she is ready for the next level we will go ahead and switch the tracheostomy to a cuffless #6 and then begin with capping trials. 2. Severe COPD at baseline continue to monitor 3. Sigmoid perforation no change continue with supportive care 4. Acute DVT at baseline continue to monitor 5. Tracheostomy will be changed out to a cuffless #6   I have personally seen and evaluated the patient, evaluated laboratory and imaging results, formulated the assessment and plan and placed orders. The Patient requires high complexity decision making with multiple systems involvement.  Rounds were done with the Respiratory Therapy Director and Staff therapists and discussed with nursing staff also.  Allyne Gee, MD [facility] Pulmonary Critical Care Medicine Sleep Medicine

## 2020-04-21 LAB — POTASSIUM: Potassium: 3.4 mmol/L — ABNORMAL LOW (ref 3.5–5.1)

## 2020-04-21 NOTE — Progress Notes (Signed)
Pulmonary Critical Care Medicine [place]   PULMONARY CRITICAL CARE SERVICE  PROGRESS NOTE  Date of Service: 04/21/2020  Tracy [name]  [id number]  DOB: 1951/09/08   DOA: 04/05/2020  Referring Physician: Merton Border, MD  HPI: Tracy [name] is a 69 y.o. female seen for follow up of Acute on Chronic Respiratory Failure.  She is doing remarkably well has been capping currently is on 2 L of oxygen after her tracheostomy was changed out she actually did quite well tolerating the weaning.  Medications: Reviewed on Rounds  Physical Exam:  Vitals: Temperature 98.0 pulse 87 respiratory 18 blood pressure is 147/75 saturations 97%  Ventilator Settings capping currently on 2 L on goal of 24 hours  . General: Comfortable at this time . Eyes: Grossly normal lids, irises & conjunctiva . ENT: grossly tongue is normal . Neck: no obvious mass . Cardiovascular: S1 S2 normal no gallop . Respiratory: No rhonchi coarse breath sounds are noted at this time . Abdomen: soft . Skin: no rash seen on limited exam . Musculoskeletal: not rigid . Psychiatric:unable to assess . Neurologic: no seizure no involuntary movements         Lab Data:   Basic Metabolic Panel: Recent Labs  Lab 04/17/20 0708 04/20/20 1024  NA 137 136  K 4.0 2.9*  CL 99 97*  CO2 28 29  GLUCOSE 198* 178*  BUN 29* 23  CREATININE 0.62 0.62  CALCIUM 8.6* 8.9    ABG: No results for input(s): PHART, PCO2ART, PO2ART, HCO3, O2SAT in the last 168 hours.  Liver Function Tests: No results for input(s): AST, ALT, ALKPHOS, BILITOT, PROT, ALBUMIN in the last 168 hours. No results for input(s): LIPASE, AMYLASE in the last 168 hours. No results for input(s): AMMONIA in the last 168 hours.  CBC: Recent Labs  Lab 04/17/20 0708 04/20/20 1024  WBC 9.4 13.4*  HGB 9.6* 11.3*  HCT 31.2* 35.1*  MCV 96.9 93.6  PLT 420* 417*    Cardiac Enzymes: No results for input(s): CKTOTAL, CKMB, CKMBINDEX,  TROPONINI in the last 168 hours.  BNP (last 3 results) Recent Labs    04/08/20 0714  BNP 42.9    ProBNP (last 3 results) No results for input(s): PROBNP in the last 8760 hours.  Radiological Exams: No results found.  Assessment/Plan Active Problems:   Acute on chronic respiratory failure with hypoxia (HCC)   COPD, severe (HCC)   Perforation of sigmoid colon (HCC)   Acute deep vein thrombosis (DVT) of brachial vein of right upper extremity ([place])   Tracheostomy status ([place])   1. Acute on chronic respiratory failure hypoxia plan is to continue with capping trials patient is on 2 L of oxygen good saturations are noted. 2. Severe COPD at baseline continue with medical management 3. Sigmoid perforation status post resection we will continue supportive care 4. Acute DVT treated 5. Tracheostomy doing well with capping   I have personally seen and evaluated the patient, evaluated laboratory and imaging results, formulated the assessment and plan and placed orders. The Patient requires high complexity decision making with multiple systems involvement.  Rounds were done with the Respiratory Therapy Director and Staff therapists and discussed with nursing staff also.  Allyne Gee, MD [facility] Pulmonary Critical Care Medicine Sleep Medicine

## 2020-04-22 ENCOUNTER — Other Ambulatory Visit (HOSPITAL_COMMUNITY): Payer: Medicare HMO

## 2020-04-22 ENCOUNTER — Encounter: Payer: Self-pay | Admitting: Internal Medicine

## 2020-04-22 LAB — POTASSIUM: Potassium: 3.3 mmol/L — ABNORMAL LOW (ref 3.5–5.1)

## 2020-04-22 MED ORDER — IOHEXOL 300 MG/ML  SOLN
100.0000 mL | Freq: Once | INTRAMUSCULAR | Status: AC | PRN
  Administered 2020-04-22: 100 mL via INTRAVENOUS

## 2020-04-22 NOTE — Progress Notes (Signed)
Pulmonary Critical Care Medicine [place]   PULMONARY CRITICAL CARE SERVICE  PROGRESS NOTE  Date of Service: 04/22/2020  Tracy [name]  [id number]  DOB: 05/09/1951   DOA: 04/05/2020  Referring Physician: Merton Border, MD  HPI: Tracy [name] is a 69 y.o. female seen for follow up of Acute on Chronic Respiratory Failure.  Patient is capping she is doing very well today she is supposed to have a CT of the abdomen as well as swallowing assessment  Medications: Reviewed on Rounds  Physical Exam:  Vitals: Temperature 96.8 pulse 79 respiratory 24 blood pressure is 104/61 saturations 99%  Ventilator Settings on capping trials 2 L oxygen  . General: Comfortable at this time . Eyes: Grossly normal lids, irises & conjunctiva . ENT: grossly tongue is normal . Neck: no obvious mass . Cardiovascular: S1 S2 normal no gallop . Respiratory: No rhonchi coarse breath sounds are noted . Abdomen: soft . Skin: no rash seen on limited exam . Musculoskeletal: not rigid . Psychiatric:unable to assess . Neurologic: no seizure no involuntary movements         Lab Data:   Basic Metabolic Panel: Recent Labs  Lab 04/17/20 0708 04/20/20 1024 04/21/20 1553 04/22/20 0607  NA 137 136  --   --   K 4.0 2.9* 3.4* 3.3*  CL 99 97*  --   --   CO2 28 29  --   --   GLUCOSE 198* 178*  --   --   BUN 29* 23  --   --   CREATININE 0.62 0.62  --   --   CALCIUM 8.6* 8.9  --   --     ABG: No results for input(s): PHART, PCO2ART, PO2ART, HCO3, O2SAT in the last 168 hours.  Liver Function Tests: No results for input(s): AST, ALT, ALKPHOS, BILITOT, PROT, ALBUMIN in the last 168 hours. No results for input(s): LIPASE, AMYLASE in the last 168 hours. No results for input(s): AMMONIA in the last 168 hours.  CBC: Recent Labs  Lab 04/17/20 0708 04/20/20 1024  WBC 9.4 13.4*  HGB 9.6* 11.3*  HCT 31.2* 35.1*  MCV 96.9 93.6  PLT 420* 417*    Cardiac Enzymes: No results for  input(s): CKTOTAL, CKMB, CKMBINDEX, TROPONINI in the last 168 hours.  BNP (last 3 results) Recent Labs    04/08/20 0714  BNP 42.9    ProBNP (last 3 results) No results for input(s): PROBNP in the last 8760 hours.  Radiological Exams: No results found.  Assessment/Plan Active Problems:   Acute on chronic respiratory failure with hypoxia (HCC)   COPD, severe (HCC)   Perforation of sigmoid colon (HCC)   Acute deep vein thrombosis (DVT) of brachial vein of right upper extremity ([place])   Tracheostomy status ([place])   1. Acute on chronic respiratory failure hypoxia we will continue with capping trials patient doing fine with 2 L 2. Severe COPD at baseline we will continue to monitor 3. Sigmoid perforation status post resection 4. Acute DVT treated we will continue to follow she still has significant edema noted 5. Tracheostomy doing well with capping trials   I have personally seen and evaluated the patient, evaluated laboratory and imaging results, formulated the assessment and plan and placed orders. The Patient requires high complexity decision making with multiple systems involvement.  Rounds were done with the Respiratory Therapy Director and Staff therapists and discussed with nursing staff also.  Allyne Gee, MD [facility] Pulmonary Critical Care Medicine  Sleep Medicine

## 2020-04-23 ENCOUNTER — Other Ambulatory Visit (HOSPITAL_COMMUNITY): Payer: Medicare HMO

## 2020-04-23 HISTORY — PX: IR THORACENTESIS ASP PLEURAL SPACE W/IMG GUIDE: IMG5380

## 2020-04-23 LAB — BASIC METABOLIC PANEL
Anion gap: 9 (ref 5–15)
BUN: 22 mg/dL (ref 8–23)
CO2: 30 mmol/L (ref 22–32)
Calcium: 8.7 mg/dL — ABNORMAL LOW (ref 8.9–10.3)
Chloride: 98 mmol/L (ref 98–111)
Creatinine, Ser: 0.57 mg/dL (ref 0.44–1.00)
GFR calc Af Amer: >60 mL/min (ref >60)
GFR calc non Af Amer: >60 mL/min (ref >60)
Glucose, Bld: 178 mg/dL — ABNORMAL HIGH (ref 70–99)
Potassium: 3.4 mmol/L — ABNORMAL LOW (ref 3.5–5.1)
Sodium: 137 mmol/L (ref 135–145)

## 2020-04-23 LAB — CBC
HCT: 30.2 % — ABNORMAL LOW (ref 36.0–46.0)
Hemoglobin: 9.4 g/dL — ABNORMAL LOW (ref 12.0–15.0)
MCH: 29.5 pg (ref 26.0–34.0)
MCHC: 31.1 g/dL (ref 30.0–36.0)
MCV: 94.7 fL (ref 80.0–100.0)
Platelets: 298 10*3/uL (ref 150–400)
RBC: 3.19 MIL/uL — ABNORMAL LOW (ref 3.87–5.11)
RDW: 15.7 % — ABNORMAL HIGH (ref 11.5–15.5)
WBC: 16.7 10*3/uL — ABNORMAL HIGH (ref 4.0–10.5)
nRBC: 0 % (ref 0.0–0.2)

## 2020-04-23 MED ORDER — LIDOCAINE HCL 1 % IJ SOLN
INTRAMUSCULAR | Status: DC | PRN
  Administered 2020-04-23: 10 mL

## 2020-04-23 MED ORDER — LIDOCAINE HCL 1 % IJ SOLN
INTRAMUSCULAR | Status: AC
  Filled 2020-04-23: qty 20

## 2020-04-23 NOTE — Progress Notes (Signed)
Pulmonary Critical Care Medicine [place]   PULMONARY CRITICAL CARE SERVICE  PROGRESS NOTE  Date of Service: 04/23/2020  Tracy [name]  [id number]  DOB: June 23, 1951   DOA: 04/05/2020  Referring Physician: Merton Border, MD  HPI: Tracy [name] is a 69 y.o. female seen for follow up of Acute on Chronic Respiratory Failure.  Patient at this time is capping on 2 L today will be 72 hours she is supposed to have possible thoracentesis done as well as a swallowing assessment.  Medications: Reviewed on Rounds  Physical Exam:  Vitals: Temperature 97.5 pulse 72 respiratory 20 blood pressure is 162/91 saturations 100%  Ventilator Settings capping on 2 L of oxygen  . General: Comfortable at this time . Eyes: Grossly normal lids, irises & conjunctiva . ENT: grossly tongue is normal . Neck: no obvious mass . Cardiovascular: S1 S2 normal no gallop . Respiratory: No rhonchi no rales are noted at this time . Abdomen: soft . Skin: no rash seen on limited exam . Musculoskeletal: not rigid . Psychiatric:unable to assess . Neurologic: no seizure no involuntary movements         Lab Data:   Basic Metabolic Panel: Recent Labs  Lab 04/17/20 0708 04/20/20 1024 04/21/20 1553 04/22/20 0607 04/23/20 0624  NA 137 136  --   --  137  K 4.0 2.9* 3.4* 3.3* 3.4*  CL 99 97*  --   --  98  CO2 28 29  --   --  30  GLUCOSE 198* 178*  --   --  178*  BUN 29* 23  --   --  22  CREATININE 0.62 0.62  --   --  0.57  CALCIUM 8.6* 8.9  --   --  8.7*    ABG: No results for input(s): PHART, PCO2ART, PO2ART, HCO3, O2SAT in the last 168 hours.  Liver Function Tests: No results for input(s): AST, ALT, ALKPHOS, BILITOT, PROT, ALBUMIN in the last 168 hours. No results for input(s): LIPASE, AMYLASE in the last 168 hours. No results for input(s): AMMONIA in the last 168 hours.  CBC: Recent Labs  Lab 04/17/20 0708 04/20/20 1024 04/23/20 0624  WBC 9.4 13.4* 16.7*  HGB 9.6*  11.3* 9.4*  HCT 31.2* 35.1* 30.2*  MCV 96.9 93.6 94.7  PLT 420* 417* 298    Cardiac Enzymes: No results for input(s): CKTOTAL, CKMB, CKMBINDEX, TROPONINI in the last 168 hours.  BNP (last 3 results) Recent Labs    04/08/20 0714  BNP 42.9    ProBNP (last 3 results) No results for input(s): PROBNP in the last 8760 hours.  Radiological Exams: CT ABDOMEN PELVIS W CONTRAST  Result Date: 04/22/2020 CLINICAL DATA:  Ongoing diffuse abdominal pain. Clinical concern for abscess. EXAM: CT ABDOMEN AND PELVIS WITH CONTRAST TECHNIQUE: Multidetector CT imaging of the abdomen and pelvis was performed using the standard protocol following bolus administration of intravenous contrast. CONTRAST:  131mL OMNIPAQUE IOHEXOL 300 MG/ML  SOLN COMPARISON:  Abdominopelvic CT 04/12/2020 and 04/01/2020. FINDINGS: Lower chest: Enlarging dependent bilateral pleural effusions, now moderate in volume. Associated atelectasis at both lung bases. No significant pericardial fluid. Bilateral breast implants are noted. Hepatobiliary: Stable subcapsular fluid collection along the anterior aspect of the left hepatic lobe measuring 2.0 x 0.8 cm on image 19/3. No new or enlarging hepatic lesions. No evidence of gallstones, gallbladder wall thickening or biliary dilatation. Pancreas: Unremarkable. No pancreatic ductal dilatation or surrounding inflammatory changes. Spleen: Normal in size without focal abnormality. Adrenals/Urinary  Tract: Both adrenal glands appear normal. The kidneys, ureters and bladder demonstrate no significant findings. No hydronephrosis. Stomach/Bowel: No evidence of bowel wall thickening, distention or surrounding inflammatory change. Nasogastric tube terminates in the mid stomach. Stable postsurgical changes following left colectomy and transverse colostomy. Hartmann's pouch appears unchanged. No extravasated enteric contrast. Vascular/Lymphatic: There are no enlarged abdominal or pelvic lymph nodes. No acute  vascular findings. Stable mild aortic and branch vessel atherosclerosis. The portal, superior mesenteric and splenic veins are patent. Reproductive: The ureters and ovaries appear normal. Prominent fluid in the vagina. No adnexal mass. Other: Stable postsurgical changes in the anterior abdominal wall. Progressive anasarca with generalized edema throughout the subcutaneous fat. Mesenteric edema appears about the same. The left retroperitoneal percutaneous drain has been removed in the interval, without recurrent significant fluid collection. A small amount of free pelvic fluid is unchanged in volume, without suspicious peripheral enhancement. Musculoskeletal: No acute or significant osseous findings. IMPRESSION: 1. No recurrent intra-abdominal abscess status post interval removal of left retroperitoneal percutaneous drain. Stable small amount of free pelvic fluid. 2. Enlarging dependent bilateral pleural effusions, now moderate in volume. Associated atelectasis at both lung bases. 3. Progressive anasarca with generalized edema throughout the subcutaneous fat. 4. Stable small subcapsular fluid collection along the anterior aspect of the left hepatic lobe. 5. Aortic Atherosclerosis (ICD10-I70.0). Electronically Signed   By: Richardean Sale M.D.   On: 04/22/2020 14:04    Assessment/Plan Active Problems:   Acute on chronic respiratory failure with hypoxia (HCC)   COPD, severe (HCC)   Perforation of sigmoid colon (HCC)   Acute deep vein thrombosis (DVT) of brachial vein of right upper extremity ([place])   Tracheostomy status ([place])   1. Acute on chronic respiratory failure with hypoxia she is doing well with capping trial she is actually ready for decannulation but will hold off until the thoracentesis is done as well is swallowing assessment. 2. Severe COPD continue with medical management inhalers along with anxiety controlled 3. Perforation of sigmoid status post resection 4. Acute DVT treated still with  significant edema upper extremity 5. Tracheostomy doing well with capping waiting for decannulation   I have personally seen and evaluated the patient, evaluated laboratory and imaging results, formulated the assessment and plan and placed orders. The Patient requires high complexity decision making with multiple systems involvement.  Rounds were done with the Respiratory Therapy Director and Staff therapists and discussed with nursing staff also.  Allyne Gee, MD [facility] Pulmonary Critical Care Medicine Sleep Medicine

## 2020-04-23 NOTE — Procedures (Signed)
Ultrasound-guided therapeutic right sided thoracentesis performed yielding 400 mililiters of straw colored fluid. No immediate complications.  Follow-up chest x-ray pending. EBL is < 2 ml.

## 2020-04-24 NOTE — Progress Notes (Signed)
Pulmonary Critical Care Medicine [place]   PULMONARY CRITICAL CARE SERVICE  PROGRESS NOTE  Date of Service: 04/24/2020  Tracy Tracy [name]  [id number]  DOB: 1951/08/29   DOA: 04/05/2020  Referring Physician: Merton Border, MD  HPI: Tracy Tracy [name] is a 69 y.o. female seen for follow up of Acute on Chronic Respiratory Failure.  Tracy [name] currently is capping on room air ready for decannulation  Medications: Reviewed on Rounds  Physical Exam:  Vitals: Temperature is 96.4 pulse 79 respiratory 15 blood pressure is 156/92 saturations 98%  Ventilator Settings capping on room air  . General: Comfortable at this time . Eyes: Grossly normal lids, irises & conjunctiva . ENT: grossly tongue is normal . Neck: no obvious mass . Cardiovascular: S1 S2 normal no gallop . Respiratory: No rhonchi no rales are noted at this time . Abdomen: soft . Skin: no rash seen on limited exam . Musculoskeletal: not rigid . Psychiatric:unable to assess . Neurologic: no seizure no involuntary movements         Lab Data:   Basic Metabolic Panel: Recent Labs  Lab 04/20/20 1024 04/21/20 1553 04/22/20 0607 04/23/20 0624  NA 136  --   --  137  K 2.9* 3.4* 3.3* 3.4*  CL 97*  --   --  98  CO2 29  --   --  30  GLUCOSE 178*  --   --  178*  BUN 23  --   --  22  CREATININE 0.62  --   --  0.57  CALCIUM 8.9  --   --  8.7*    ABG: No results for input(s): PHART, PCO2ART, PO2ART, HCO3, O2SAT in the last 168 hours.  Liver Function Tests: No results for input(s): AST, ALT, ALKPHOS, BILITOT, PROT, ALBUMIN in the last 168 hours. No results for input(s): LIPASE, AMYLASE in the last 168 hours. No results for input(s): AMMONIA in the last 168 hours.  CBC: Recent Labs  Lab 04/20/20 1024 04/23/20 0624  WBC 13.4* 16.7*  HGB 11.3* 9.4*  HCT 35.1* 30.2*  MCV 93.6 94.7  PLT 417* 298    Cardiac Enzymes: No results for input(s): CKTOTAL, CKMB, CKMBINDEX, TROPONINI in the last 168  hours.  BNP (last 3 results) Recent Labs    04/08/20 0714  BNP 42.9    ProBNP (last 3 results) No results for input(s): PROBNP in the last 8760 hours.  Radiological Exams: CT ABDOMEN PELVIS W CONTRAST  Result Date: 04/22/2020 CLINICAL DATA:  Ongoing diffuse abdominal pain. Clinical concern for abscess. EXAM: CT ABDOMEN Tracy PELVIS WITH CONTRAST TECHNIQUE: Multidetector CT imaging of the abdomen Tracy pelvis was performed using the standard protocol following bolus administration of intravenous contrast. CONTRAST:  168mL OMNIPAQUE IOHEXOL 300 MG/ML  SOLN COMPARISON:  Abdominopelvic CT 04/12/2020 Tracy 04/01/2020. FINDINGS: Lower chest: Enlarging dependent bilateral pleural effusions, now moderate in volume. Associated atelectasis at both lung bases. No significant pericardial fluid. Bilateral breast implants are noted. Hepatobiliary: Stable subcapsular fluid collection along the anterior aspect of the left hepatic lobe measuring 2.0 x 0.8 cm on image 19/3. No new or enlarging hepatic lesions. No evidence of gallstones, gallbladder wall thickening or biliary dilatation. Pancreas: Unremarkable. No pancreatic ductal dilatation or surrounding inflammatory changes. Spleen: Normal in size without focal abnormality. Adrenals/Urinary Tract: Both adrenal glands appear normal. The kidneys, ureters Tracy bladder demonstrate no significant findings. No hydronephrosis. Stomach/Bowel: No evidence of bowel wall thickening, distention or surrounding inflammatory change. Nasogastric tube terminates in the mid stomach.  Stable postsurgical changes following left colectomy Tracy transverse colostomy. Hartmann's pouch appears unchanged. No extravasated enteric contrast. Vascular/Lymphatic: Tracy are no enlarged abdominal or pelvic lymph nodes. No acute vascular findings. Stable mild aortic Tracy branch vessel atherosclerosis. The portal, superior mesenteric Tracy splenic veins are patent. Reproductive: The ureters Tracy ovaries appear  normal. Prominent fluid in the vagina. No adnexal mass. Other: Stable postsurgical changes in the anterior abdominal wall. Progressive anasarca with generalized edema throughout the subcutaneous fat. Mesenteric edema appears about the same. The left retroperitoneal percutaneous drain has been removed in the interval, without recurrent significant fluid collection. A small amount of free pelvic fluid is unchanged in volume, without suspicious peripheral enhancement. Musculoskeletal: No acute or significant osseous findings. IMPRESSION: 1. No recurrent intra-abdominal abscess status post interval removal of left retroperitoneal percutaneous drain. Stable small amount of free pelvic fluid. 2. Enlarging dependent bilateral pleural effusions, now moderate in volume. Associated atelectasis at both lung bases. 3. Progressive anasarca with generalized edema throughout the subcutaneous fat. 4. Stable small subcapsular fluid collection along the anterior aspect of the left hepatic lobe. 5. Aortic Atherosclerosis (ICD10-I70.0). Electronically Signed   By: Richardean Sale M.D.   On: 04/22/2020 14:04   DG Chest Port 1 View  Result Date: 04/23/2020 CLINICAL DATA:  Status post thoracentesis EXAM: PORTABLE CHEST 1 VIEW COMPARISON:  04/09/2020 FINDINGS: Layering left pleural effusion. No visible right effusion. No pneumothorax following right thoracentesis. Tracheostomy Tracy right PICC line in place. Heart is normal size. IMPRESSION: Layering left pleural effusion with left base atelectasis. No pneumothorax following right thoracentesis. Electronically Signed   By: Rolm Baptise M.D.   On: 04/23/2020 14:49   IR THORACENTESIS ASP PLEURAL SPACE W/IMG GUIDE  Result Date: 04/23/2020 INDICATION: Tracy [name] with history of perforated sigmoid colon Tracy proximal rectal cancer found to have bilateral pleural effusion. Request is for therapeutic thoracentesis EXAM: ULTRASOUND GUIDED THERAPEUTIC THORACENTESIS MEDICATIONS: Lidocaine 1% 10 mL  COMPLICATIONS: None immediate. PROCEDURE: An ultrasound guided thoracentesis was thoroughly discussed with the Tracy [name] Tracy questions answered. The benefits, risks, alternatives Tracy complications were also discussed. The Tracy [name] understands Tracy wishes to proceed with the procedure. Written consent was obtained. Ultrasound was performed to localize Tracy mark an adequate pocket of fluid in the right chest. The area was then prepped Tracy draped in the normal sterile fashion. 1% Lidocaine was used for local anesthesia. Under ultrasound guidance a 6 Fr Safe-T-Centesis catheter was introduced. Thoracentesis was performed. The catheter was removed Tracy a dressing applied. FINDINGS: A total of approximately 400 mL of straw-colored fluid was removed. IMPRESSION: Successful ultrasound guided therapeutic right-sided thoracentesis yielding 400 mL of pleural fluid. Read by: Rushie Nyhan, NP Electronically Signed   By: Corrie Mckusick D.O.   On: 04/23/2020 16:12    Assessment/Plan Active Problems:   Acute on chronic respiratory failure with hypoxia (HCC)   COPD, severe (HCC)   Perforation of sigmoid colon (HCC)   Acute deep vein thrombosis (DVT) of brachial vein of right upper extremity ([place])   Tracheostomy status ([place])   1. Acute on chronic respiratory failure with hypoxia we will continue with capping trials as tolerated. 2. Severe COPD at baseline continue to monitor 3. Perforation of sigmoid no change continue supportive care 4. Acute DVT treated 5. Tracheostomy remains in place   I have personally seen Tracy evaluated the Tracy [name], evaluated laboratory Tracy imaging results, formulated the assessment Tracy plan Tracy placed orders. The Tracy [name] requires high complexity decision making with multiple systems involvement.  Rounds were  done with the Respiratory Therapy Director Tracy Staff therapists Tracy discussed with nursing staff also.  Allyne Gee, MD [facility] Pulmonary Critical Care Medicine Sleep Medicine

## 2020-04-25 NOTE — Progress Notes (Signed)
Pulmonary Critical Care Medicine [place]   PULMONARY CRITICAL CARE SERVICE  PROGRESS NOTE  Date of Service: 04/25/2020  Tracy [name]  [id number]  DOB: 12-29-1950   DOA: 04/05/2020  Referring Physician: Merton Border, MD  HPI: Tracy [name] is a 70 y.o. female seen for follow up of Acute on Chronic Respiratory Failure.  Patient is off the ventilator doing well right now decannulated yesterday.  She had an episode of choking it appears both times she thinks it was because she did not chew her food enough and she also ate too quickly.  She is now off of oxygen  Medications: Reviewed on Rounds  Physical Exam:  Vitals: Temperature 98.0 pulse 88 respiratory 22 blood pressure is 167/96 saturations 94%  Ventilator Settings decannulated on room air  . General: Comfortable at this time . Eyes: Grossly normal lids, irises & conjunctiva . ENT: grossly tongue is normal . Neck: no obvious mass . Cardiovascular: S1 S2 normal no gallop . Respiratory: No rhonchi no rales are noted at this time . Abdomen: soft . Skin: no rash seen on limited exam . Musculoskeletal: not rigid . Psychiatric:unable to assess . Neurologic: no seizure no involuntary movements         Lab Data:   Basic Metabolic Panel: Recent Labs  Lab 04/20/20 1024 04/21/20 1553 04/22/20 0607 04/23/20 0624  NA 136  --   --  137  K 2.9* 3.4* 3.3* 3.4*  CL 97*  --   --  98  CO2 29  --   --  30  GLUCOSE 178*  --   --  178*  BUN 23  --   --  22  CREATININE 0.62  --   --  0.57  CALCIUM 8.9  --   --  8.7*    ABG: No results for input(s): PHART, PCO2ART, PO2ART, HCO3, O2SAT in the last 168 hours.  Liver Function Tests: No results for input(s): AST, ALT, ALKPHOS, BILITOT, PROT, ALBUMIN in the last 168 hours. No results for input(s): LIPASE, AMYLASE in the last 168 hours. No results for input(s): AMMONIA in the last 168 hours.  CBC: Recent Labs  Lab 04/20/20 1024 04/23/20 0624  WBC  13.4* 16.7*  HGB 11.3* 9.4*  HCT 35.1* 30.2*  MCV 93.6 94.7  PLT 417* 298    Cardiac Enzymes: No results for input(s): CKTOTAL, CKMB, CKMBINDEX, TROPONINI in the last 168 hours.  BNP (last 3 results) Recent Labs    04/08/20 0714  BNP 42.9    ProBNP (last 3 results) No results for input(s): PROBNP in the last 8760 hours.  Radiological Exams: No results found.  Assessment/Plan Active Problems:   Acute on chronic respiratory failure with hypoxia (HCC)   COPD, severe (HCC)   Perforation of sigmoid colon (HCC)   Acute deep vein thrombosis (DVT) of brachial vein of right upper extremity ([place])   Tracheostomy status ([place])   1. Acute on chronic respiratory failure hypoxia we will continue with the supportive care oxygen as necessary. 2. Precautions as far as eating is concerned 3. Severe COPD and chronic respiratory failure patient right now is off oxygen will continue to assess 4. Perforated sigmoid status post repair 5. DVT still has a catheter in place on the right side could possibly be considered for removal 6. Tracheostomy removed   I have personally seen and evaluated the patient, evaluated laboratory and imaging results, formulated the assessment and plan and placed orders. The Patient requires high  complexity decision making with multiple systems involvement.  Rounds were done with the Respiratory Therapy Director and Staff therapists and discussed with nursing staff also.  Allyne Gee, MD [facility] Pulmonary Critical Care Medicine Sleep Medicine

## 2020-04-26 LAB — BASIC METABOLIC PANEL
Anion gap: 9 (ref 5–15)
BUN: 15 mg/dL (ref 8–23)
CO2: 28 mmol/L (ref 22–32)
Calcium: 8.3 mg/dL — ABNORMAL LOW (ref 8.9–10.3)
Chloride: 101 mmol/L (ref 98–111)
Creatinine, Ser: 0.48 mg/dL (ref 0.44–1.00)
GFR calc Af Amer: >60 mL/min (ref >60)
GFR calc non Af Amer: >60 mL/min (ref >60)
Glucose, Bld: 118 mg/dL — ABNORMAL HIGH (ref 70–99)
Potassium: 2.5 mmol/L — CL (ref 3.5–5.1)
Sodium: 138 mmol/L (ref 135–145)

## 2020-04-26 LAB — CBC
HCT: 29 % — ABNORMAL LOW (ref 36.0–46.0)
Hemoglobin: 9.4 g/dL — ABNORMAL LOW (ref 12.0–15.0)
MCH: 30.2 pg (ref 26.0–34.0)
MCHC: 32.4 g/dL (ref 30.0–36.0)
MCV: 93.2 fL (ref 80.0–100.0)
Platelets: 220 10*3/uL (ref 150–400)
RBC: 3.11 MIL/uL — ABNORMAL LOW (ref 3.87–5.11)
RDW: 15.9 % — ABNORMAL HIGH (ref 11.5–15.5)
WBC: 11 10*3/uL — ABNORMAL HIGH (ref 4.0–10.5)
nRBC: 0 % (ref 0.0–0.2)

## 2020-04-26 LAB — MAGNESIUM: Magnesium: 1.7 mg/dL (ref 1.7–2.4)

## 2020-04-26 NOTE — Progress Notes (Addendum)
Pulmonary Critical Care Medicine [place]   PULMONARY CRITICAL CARE SERVICE  PROGRESS NOTE  Date of Service: 04/26/2020  Tracy [name]  [id number]  DOB: [birth year]   DOA: 04/05/2020  Referring Physician: Merton Border, MD  HPI: Tracy [name] is a 69 y.o. female seen for follow up of Acute on Chronic Respiratory Failure.  Patient is currently on room air at this time satting well with no distress.  Medications: Reviewed on Rounds  Physical Exam:  Vitals: Pulse 82 respirations 16 BP 156/88 O2 sat 95% temp 97.6  Ventilator Settings room air  . General: Comfortable at this time . Eyes: Grossly normal lids, irises & conjunctiva . ENT: grossly tongue is normal . Neck: no obvious mass . Cardiovascular: S1 S2 normal no gallop . Respiratory: No rales or rhonchi noted . Abdomen: soft . Skin: no rash seen on limited exam . Musculoskeletal: not rigid . Psychiatric:unable to assess . Neurologic: no seizure no involuntary movements         Lab Data:   Basic Metabolic Panel: Recent Labs  Lab 04/20/20 1024 04/21/20 1553 04/22/20 0607 04/23/20 0624 04/26/20 0701  NA 136  --   --  137 138  K 2.9* 3.4* 3.3* 3.4* 2.5*  CL 97*  --   --  98 101  CO2 29  --   --  30 28  GLUCOSE 178*  --   --  178* 118*  BUN 23  --   --  22 15  CREATININE 0.62  --   --  0.57 0.48  CALCIUM 8.9  --   --  8.7* 8.3*  MG  --   --   --   --  1.7    ABG: No results for input(s): PHART, PCO2ART, PO2ART, HCO3, O2SAT in the last 168 hours.  Liver Function Tests: No results for input(s): AST, ALT, ALKPHOS, BILITOT, PROT, ALBUMIN in the last 168 hours. No results for input(s): LIPASE, AMYLASE in the last 168 hours. No results for input(s): AMMONIA in the last 168 hours.  CBC: Recent Labs  Lab 04/20/20 1024 04/23/20 0624 04/26/20 0701  WBC 13.4* 16.7* 11.0*  HGB 11.3* 9.4* 9.4*  HCT 35.1* 30.2* 29.0*  MCV 93.6 94.7 93.2  PLT 417* 298 220    Cardiac Enzymes: No  results for input(s): CKTOTAL, CKMB, CKMBINDEX, TROPONINI in the last 168 hours.  BNP (last 3 results) Recent Labs    04/08/20 0714  BNP 42.9    ProBNP (last 3 results) No results for input(s): PROBNP in the last 8760 hours.  Radiological Exams: No results found.  Assessment/Plan Active Problems:   Acute on chronic respiratory failure with hypoxia (HCC)   COPD, severe (HCC)   Perforation of sigmoid colon (HCC)   Acute deep vein thrombosis (DVT) of brachial vein of right upper extremity ([place])   Tracheostomy status ([place])   1. Acute on chronic respiratory failure hypoxia we will continue with the supportive care oxygen as necessary.  2. Precautions as far as eating is concerned 3. Severe COPD and chronic respiratory failure patient right now is off oxygen will continue to assess 4. Perforated sigmoid status post repair 5. DVT still has a catheter in place on the right side could possibly be considered for removal 6. Tracheostomy removed   I have personally seen and evaluated the patient, evaluated laboratory and imaging results, formulated the assessment and plan and placed orders. The Patient requires high complexity decision making with multiple systems involvement.  Rounds were done with the Respiratory Therapy Director and Staff therapists and discussed with nursing staff also.  Allyne Gee, MD [facility] Pulmonary Critical Care Medicine Sleep Medicine

## 2020-04-27 LAB — POTASSIUM: Potassium: 5 mmol/L (ref 3.5–5.1)

## 2020-04-27 NOTE — Progress Notes (Addendum)
Pulmonary Critical Care Medicine [place]   PULMONARY CRITICAL CARE SERVICE  PROGRESS NOTE  Date of Service: 04/27/2020  Tracy [name]  [id number]  DOB: 11-Apr-1951   DOA: 04/05/2020  Referring Physician: Merton Border, MD  HPI: Tracy [name] is a 69 y.o. female seen for follow up of Acute on Chronic Respiratory Failure.  Patient remains on room air at this time satting well no fever or distress.  Medications: Reviewed on Rounds  Physical Exam:  Vitals: Pulse 92 respirations 18 BP 131/80 O2 sat 97% temp 97.0  Ventilator Settings room air  . General: Comfortable at this time . Eyes: Grossly normal lids, irises & conjunctiva . ENT: grossly tongue is normal . Neck: no obvious mass . Cardiovascular: S1 S2 normal no gallop . Respiratory: No rales or rhonchi noted . Abdomen: soft . Skin: no rash seen on limited exam . Musculoskeletal: not rigid . Psychiatric:unable to assess . Neurologic: no seizure no involuntary movements         Lab Data:   Basic Metabolic Panel: Recent Labs  Lab 04/21/20 1553 04/22/20 0607 04/23/20 0624 04/26/20 0701 04/27/20 1010  NA  --   --  137 138  --   K 3.4* 3.3* 3.4* 2.5* 5.0  CL  --   --  98 101  --   CO2  --   --  30 28  --   GLUCOSE  --   --  178* 118*  --   BUN  --   --  22 15  --   CREATININE  --   --  0.57 0.48  --   CALCIUM  --   --  8.7* 8.3*  --   MG  --   --   --  1.7  --     ABG: No results for input(s): PHART, PCO2ART, PO2ART, HCO3, O2SAT in the last 168 hours.  Liver Function Tests: No results for input(s): AST, ALT, ALKPHOS, BILITOT, PROT, ALBUMIN in the last 168 hours. No results for input(s): LIPASE, AMYLASE in the last 168 hours. No results for input(s): AMMONIA in the last 168 hours.  CBC: Recent Labs  Lab 04/23/20 0624 04/26/20 0701  WBC 16.7* 11.0*  HGB 9.4* 9.4*  HCT 30.2* 29.0*  MCV 94.7 93.2  PLT 298 220    Cardiac Enzymes: No results for input(s): CKTOTAL, CKMB,  CKMBINDEX, TROPONINI in the last 168 hours.  BNP (last 3 results) Recent Labs    04/08/20 0714  BNP 42.9    ProBNP (last 3 results) No results for input(s): PROBNP in the last 8760 hours.  Radiological Exams: No results found.  Assessment/Plan Active Problems:   Acute on chronic respiratory failure with hypoxia (HCC)   COPD, severe (HCC)   Perforation of sigmoid colon (HCC)   Acute deep vein thrombosis (DVT) of brachial vein of right upper extremity ([place])   Tracheostomy status ([place])   1. Acute on chronic respiratory failure hypoxia we will continue with the supportive care oxygen as necessary.  2. Precautions as far as eating is concerned 3. Severe COPD and chronic respiratory failure patient right now is off oxygen will continue to assess 4. Perforated sigmoid status post repair 5. DVT still has a catheter in place on the right side could possibly be considered for removal 6. Tracheostomy removed   I have personally seen and evaluated the patient, evaluated laboratory and imaging results, formulated the assessment and plan and placed orders. The Patient requires high  complexity decision making with multiple systems involvement.  Rounds were done with the Respiratory Therapy Director and Staff therapists and discussed with nursing staff also.  Allyne Gee, MD [facility] Pulmonary Critical Care Medicine Sleep Medicine

## 2020-04-28 LAB — BASIC METABOLIC PANEL
Anion gap: 10 (ref 5–15)
BUN: 13 mg/dL (ref 8–23)
CO2: 28 mmol/L (ref 22–32)
Calcium: 8.8 mg/dL — ABNORMAL LOW (ref 8.9–10.3)
Chloride: 97 mmol/L — ABNORMAL LOW (ref 98–111)
Creatinine, Ser: 0.43 mg/dL — ABNORMAL LOW (ref 0.44–1.00)
GFR calc Af Amer: >60 mL/min (ref >60)
GFR calc non Af Amer: >60 mL/min (ref >60)
Glucose, Bld: 145 mg/dL — ABNORMAL HIGH (ref 70–99)
Potassium: 3.7 mmol/L (ref 3.5–5.1)
Sodium: 135 mmol/L (ref 135–145)

## 2020-04-28 LAB — CBC
HCT: 32.4 % — ABNORMAL LOW (ref 36.0–46.0)
Hemoglobin: 10.7 g/dL — ABNORMAL LOW (ref 12.0–15.0)
MCH: 30.1 pg (ref 26.0–34.0)
MCHC: 33 g/dL (ref 30.0–36.0)
MCV: 91.3 fL (ref 80.0–100.0)
Platelets: 236 10*3/uL (ref 150–400)
RBC: 3.55 MIL/uL — ABNORMAL LOW (ref 3.87–5.11)
RDW: 16 % — ABNORMAL HIGH (ref 11.5–15.5)
WBC: 11.4 10*3/uL — ABNORMAL HIGH (ref 4.0–10.5)
nRBC: 0 % (ref 0.0–0.2)

## 2020-04-29 LAB — BASIC METABOLIC PANEL
Anion gap: 6 (ref 5–15)
BUN: 13 mg/dL (ref 8–23)
CO2: 28 mmol/L (ref 22–32)
Calcium: 8.4 mg/dL — ABNORMAL LOW (ref 8.9–10.3)
Chloride: 101 mmol/L (ref 98–111)
Creatinine, Ser: 0.43 mg/dL — ABNORMAL LOW (ref 0.44–1.00)
GFR calc Af Amer: >60 mL/min (ref >60)
GFR calc non Af Amer: >60 mL/min (ref >60)
Glucose, Bld: 123 mg/dL — ABNORMAL HIGH (ref 70–99)
Potassium: 3.5 mmol/L (ref 3.5–5.1)
Sodium: 135 mmol/L (ref 135–145)

## 2020-04-29 NOTE — Progress Notes (Signed)
Pulmonary Critical Care Medicine [place]   PULMONARY CRITICAL CARE SERVICE  PROGRESS NOTE  Date of Service: 04/29/2020  Tracy [name]  [id number]  DOB: 05/18/1951   DOA: 04/05/2020  Referring Physician: Merton Border, MD  HPI: Tracy [name] is a 69 y.o. female seen for follow up of Acute on Chronic Respiratory Failure.  Patient is decannulated no issues right now patient remains on room air without distress  Medications: Reviewed on Rounds  Physical Exam:  Vitals: Temperature is 96.6 pulse 91 respiratory 24 blood pressure is 142/73 saturations 95%  Ventilator Settings room air decannulated  . General: Comfortable at this time . Eyes: Grossly normal lids, irises & conjunctiva . ENT: grossly tongue is normal . Neck: no obvious mass . Cardiovascular: S1 S2 normal no gallop . Respiratory: No rhonchi coarse breath sounds . Abdomen: soft . Skin: no rash seen on limited exam . Musculoskeletal: not rigid . Psychiatric:unable to assess . Neurologic: no seizure no involuntary movements         Lab Data:   Basic Metabolic Panel: Recent Labs  Lab 04/23/20 0624 04/26/20 0701 04/27/20 1010 04/28/20 0508 04/29/20 0431  NA 137 138  --  135 135  K 3.4* 2.5* 5.0 3.7 3.5  CL 98 101  --  97* 101  CO2 30 28  --  28 28  GLUCOSE 178* 118*  --  145* 123*  BUN 22 15  --  13 13  CREATININE 0.57 0.48  --  0.43* 0.43*  CALCIUM 8.7* 8.3*  --  8.8* 8.4*  MG  --  1.7  --   --   --     ABG: No results for input(s): PHART, PCO2ART, PO2ART, HCO3, O2SAT in the last 168 hours.  Liver Function Tests: No results for input(s): AST, ALT, ALKPHOS, BILITOT, PROT, ALBUMIN in the last 168 hours. No results for input(s): LIPASE, AMYLASE in the last 168 hours. No results for input(s): AMMONIA in the last 168 hours.  CBC: Recent Labs  Lab 04/23/20 0624 04/26/20 0701 04/28/20 0508  WBC 16.7* 11.0* 11.4*  HGB 9.4* 9.4* 10.7*  HCT 30.2* 29.0* 32.4*  MCV 94.7 93.2  91.3  PLT 298 220 236    Cardiac Enzymes: No results for input(s): CKTOTAL, CKMB, CKMBINDEX, TROPONINI in the last 168 hours.  BNP (last 3 results) Recent Labs    04/08/20 0714  BNP 42.9    ProBNP (last 3 results) No results for input(s): PROBNP in the last 8760 hours.  Radiological Exams: No results found.  Assessment/Plan Active Problems:   Acute on chronic respiratory failure with hypoxia (HCC)   COPD, severe (HCC)   Perforation of sigmoid colon (HCC)   Acute deep vein thrombosis (DVT) of brachial vein of right upper extremity ([place])   Tracheostomy status ([place])   1. Acute on chronic respiratory failure hypoxia patient has been successfully weaned and decannulated supportive care oxygen as needed 2. Severe COPD at baseline continue present management 3. Sigmoid perforation no change we will continue to follow 4. DVT resolving 5. Tracheostomy removed stoma closure   I have personally seen and evaluated the patient, evaluated laboratory and imaging results, formulated the assessment and plan and placed orders. The Patient requires high complexity decision making with multiple systems involvement.  Rounds were done with the Respiratory Therapy Director and Staff therapists and discussed with nursing staff also.  Allyne Gee, MD [facility] Pulmonary Critical Care Medicine Sleep Medicine

## 2020-04-29 NOTE — Progress Notes (Addendum)
Pulmonary Critical Care Medicine [place]   PULMONARY CRITICAL CARE SERVICE  PROGRESS NOTE  Date of Service: 04/28/2020  Tracy [name]  [id number]  DOB: [birth year]   DOA: 04/05/2020  Referring Physician: Merton Border, MD  HPI: Tracy [name] is a 69 y.o. female seen for follow up of Acute on Chronic Respiratory Failure.  Patient remains on 20% aerosol trach collar satting well with no fever or distress.  Medications: Reviewed on Rounds  Physical Exam:  Vitals: Pulse 96 respirations 24 BP 120/78 O2 sat 92% temp 98.6  Ventilator Settings room air at this time  . General: Comfortable at this time . Eyes: Grossly normal lids, irises & conjunctiva . ENT: grossly tongue is normal . Neck: no obvious mass . Cardiovascular: S1 S2 normal no gallop . Respiratory: No rales or rhonchi noted . Abdomen: soft . Skin: no rash seen on limited exam . Musculoskeletal: not rigid . Psychiatric:unable to assess . Neurologic: no seizure no involuntary movements         Lab Data:   Basic Metabolic Panel: Recent Labs  Lab 04/23/20 0624 04/26/20 0701 04/27/20 1010 04/28/20 0508 04/29/20 0431  NA 137 138  --  135 135  K 3.4* 2.5* 5.0 3.7 3.5  CL 98 101  --  97* 101  CO2 30 28  --  28 28  GLUCOSE 178* 118*  --  145* 123*  BUN 22 15  --  13 13  CREATININE 0.57 0.48  --  0.43* 0.43*  CALCIUM 8.7* 8.3*  --  8.8* 8.4*  MG  --  1.7  --   --   --     ABG: No results for input(s): PHART, PCO2ART, PO2ART, HCO3, O2SAT in the last 168 hours.  Liver Function Tests: No results for input(s): AST, ALT, ALKPHOS, BILITOT, PROT, ALBUMIN in the last 168 hours. No results for input(s): LIPASE, AMYLASE in the last 168 hours. No results for input(s): AMMONIA in the last 168 hours.  CBC: Recent Labs  Lab 04/23/20 0624 04/26/20 0701 04/28/20 0508  WBC 16.7* 11.0* 11.4*  HGB 9.4* 9.4* 10.7*  HCT 30.2* 29.0* 32.4*  MCV 94.7 93.2 91.3  PLT 298 220 236    Cardiac  Enzymes: No results for input(s): CKTOTAL, CKMB, CKMBINDEX, TROPONINI in the last 168 hours.  BNP (last 3 results) Recent Labs    04/08/20 0714  BNP 42.9    ProBNP (last 3 results) No results for input(s): PROBNP in the last 8760 hours.  Radiological Exams: No results found.  Assessment/Plan Active Problems:   Acute on chronic respiratory failure with hypoxia (HCC)   COPD, severe (HCC)   Perforation of sigmoid colon (HCC)   Acute deep vein thrombosis (DVT) of brachial vein of right upper extremity ([place])   Tracheostomy status ([place])   1. Acute on chronic respiratory failure with hypoxia.  Patient remains on room air at this time can use oxygen as needed.  Continue supportive measures and pulmonary toilet. 2. Precautions as far as eating is concerned 3. Severe COPD and chronic respiratory failure patient right now is off oxygen will continue to assess 4. Perforated sigmoid status post repair 5. DVT still has a catheter in place on the right side could possibly be considered for removal 6. Tracheostomy removed   I have personally seen and evaluated the patient, evaluated laboratory and imaging results, formulated the assessment and plan and placed orders. The Patient requires high complexity decision making with multiple systems involvement.  Rounds were done with the Respiratory Therapy Director and Staff therapists and discussed with nursing staff also.  Allyne Gee, MD [facility] Pulmonary Critical Care Medicine Sleep Medicine

## 2020-04-30 NOTE — Progress Notes (Signed)
Pulmonary Critical Care Medicine [place]   PULMONARY CRITICAL CARE SERVICE  PROGRESS NOTE  Date of Service: 04/30/2020  PAULITA VALLADARES  [id number]  DOB: 10-21-1951   DOA: 04/05/2020  Referring Physician: Merton Border, MD  HPI: ELISEA DELCOUR is a 69 y.o. female seen for follow up of Acute on Chronic Respiratory Failure.  She is doing very well she is decannulated stoma was almost closed  Medications: Reviewed on Rounds  Physical Exam:  Vitals: Temperature is 98.0 pulse 86 respiratory 20 blood pressure is 164/66 saturations 96%  Ventilator Settings decannulated  . General: Comfortable at this time . Eyes: Grossly normal lids, irises & conjunctiva . ENT: grossly tongue is normal . Neck: no obvious mass . Cardiovascular: S1 S2 normal no gallop . Respiratory: No rhonchi no rales . Abdomen: soft . Skin: no rash seen on limited exam . Musculoskeletal: not rigid . Psychiatric:unable to assess . Neurologic: no seizure no involuntary movements         Lab Data:   Basic Metabolic Panel: Recent Labs  Lab 04/26/20 0701 04/27/20 1010 04/28/20 0508 04/29/20 0431  NA 138  --  135 135  K 2.5* 5.0 3.7 3.5  CL 101  --  97* 101  CO2 28  --  28 28  GLUCOSE 118*  --  145* 123*  BUN 15  --  13 13  CREATININE 0.48  --  0.43* 0.43*  CALCIUM 8.3*  --  8.8* 8.4*  MG 1.7  --   --   --     ABG: No results for input(s): PHART, PCO2ART, PO2ART, HCO3, O2SAT in the last 168 hours.  Liver Function Tests: No results for input(s): AST, ALT, ALKPHOS, BILITOT, PROT, ALBUMIN in the last 168 hours. No results for input(s): LIPASE, AMYLASE in the last 168 hours. No results for input(s): AMMONIA in the last 168 hours.  CBC: Recent Labs  Lab 04/26/20 0701 04/28/20 0508  WBC 11.0* 11.4*  HGB 9.4* 10.7*  HCT 29.0* 32.4*  MCV 93.2 91.3  PLT 220 236    Cardiac Enzymes: No results for input(s): CKTOTAL, CKMB, CKMBINDEX, TROPONINI in the last 168  hours.  BNP (last 3 results) Recent Labs    04/08/20 0714  BNP 42.9    ProBNP (last 3 results) No results for input(s): PROBNP in the last 8760 hours.  Radiological Exams: No results found.  Assessment/Plan Active Problems:   Acute on chronic respiratory failure with hypoxia (HCC)   COPD, severe (HCC)   Perforation of sigmoid colon (HCC)   Acute deep vein thrombosis (DVT) of brachial vein of right upper extremity ([place])   1. Acute on chronic respiratory failure hypoxia plan is to continue with supportive care patient has been successfully weaned and decannulated stoma is closing 2. Severe COPD medical management 3. Perforated sigmoid status post resection 4. Acute DVT treated improving    I have personally seen and evaluated the patient, evaluated laboratory and imaging results, formulated the assessment and plan and placed orders. The Patient requires high complexity decision making with multiple systems involvement.  Rounds were done with the Respiratory Therapy Director and Staff therapists and discussed with nursing staff also.  Allyne Gee, MD [facility] Pulmonary Critical Care Medicine Sleep Medicine

## 2020-05-01 ENCOUNTER — Ambulatory Visit: Payer: Medicare HMO | Admitting: Family Medicine

## 2020-05-01 DIAGNOSIS — I82621 Acute embolism and thrombosis of deep veins of right upper extremity: Secondary | ICD-10-CM | POA: Diagnosis not present

## 2020-05-01 DIAGNOSIS — Z48815 Encounter for surgical aftercare following surgery on the digestive system: Secondary | ICD-10-CM | POA: Diagnosis not present

## 2020-05-01 DIAGNOSIS — R52 Pain, unspecified: Secondary | ICD-10-CM | POA: Diagnosis not present

## 2020-05-01 DIAGNOSIS — R1310 Dysphagia, unspecified: Secondary | ICD-10-CM | POA: Diagnosis not present

## 2020-05-01 DIAGNOSIS — C20 Malignant neoplasm of rectum: Secondary | ICD-10-CM | POA: Diagnosis not present

## 2020-05-01 DIAGNOSIS — A4152 Sepsis due to Pseudomonas: Secondary | ICD-10-CM | POA: Diagnosis not present

## 2020-05-01 DIAGNOSIS — K651 Peritoneal abscess: Secondary | ICD-10-CM | POA: Diagnosis not present

## 2020-05-01 DIAGNOSIS — M6281 Muscle weakness (generalized): Secondary | ICD-10-CM | POA: Diagnosis not present

## 2020-05-01 DIAGNOSIS — N179 Acute kidney failure, unspecified: Secondary | ICD-10-CM | POA: Diagnosis not present

## 2020-05-01 DIAGNOSIS — J9600 Acute respiratory failure, unspecified whether with hypoxia or hypercapnia: Secondary | ICD-10-CM | POA: Diagnosis not present

## 2020-05-01 DIAGNOSIS — R279 Unspecified lack of coordination: Secondary | ICD-10-CM | POA: Diagnosis not present

## 2020-05-01 DIAGNOSIS — Z743 Need for continuous supervision: Secondary | ICD-10-CM | POA: Diagnosis not present

## 2020-05-01 DIAGNOSIS — R69 Illness, unspecified: Secondary | ICD-10-CM | POA: Diagnosis not present

## 2020-05-01 DIAGNOSIS — J90 Pleural effusion, not elsewhere classified: Secondary | ICD-10-CM | POA: Diagnosis not present

## 2020-05-01 DIAGNOSIS — Z433 Encounter for attention to colostomy: Secondary | ICD-10-CM | POA: Diagnosis not present

## 2020-05-01 DIAGNOSIS — R1084 Generalized abdominal pain: Secondary | ICD-10-CM | POA: Diagnosis not present

## 2020-05-01 DIAGNOSIS — J449 Chronic obstructive pulmonary disease, unspecified: Secondary | ICD-10-CM | POA: Diagnosis not present

## 2020-05-01 DIAGNOSIS — E43 Unspecified severe protein-calorie malnutrition: Secondary | ICD-10-CM | POA: Diagnosis not present

## 2020-05-02 DIAGNOSIS — N179 Acute kidney failure, unspecified: Secondary | ICD-10-CM | POA: Diagnosis not present

## 2020-05-02 DIAGNOSIS — K651 Peritoneal abscess: Secondary | ICD-10-CM | POA: Diagnosis not present

## 2020-05-02 DIAGNOSIS — J9600 Acute respiratory failure, unspecified whether with hypoxia or hypercapnia: Secondary | ICD-10-CM | POA: Diagnosis not present

## 2020-05-02 DIAGNOSIS — C20 Malignant neoplasm of rectum: Secondary | ICD-10-CM | POA: Diagnosis not present

## 2020-05-03 DIAGNOSIS — J9600 Acute respiratory failure, unspecified whether with hypoxia or hypercapnia: Secondary | ICD-10-CM | POA: Diagnosis not present

## 2020-05-03 DIAGNOSIS — N179 Acute kidney failure, unspecified: Secondary | ICD-10-CM | POA: Diagnosis not present

## 2020-05-03 DIAGNOSIS — K651 Peritoneal abscess: Secondary | ICD-10-CM | POA: Diagnosis not present

## 2020-05-03 DIAGNOSIS — C20 Malignant neoplasm of rectum: Secondary | ICD-10-CM | POA: Diagnosis not present

## 2020-05-06 DIAGNOSIS — N179 Acute kidney failure, unspecified: Secondary | ICD-10-CM | POA: Diagnosis not present

## 2020-05-06 DIAGNOSIS — J9600 Acute respiratory failure, unspecified whether with hypoxia or hypercapnia: Secondary | ICD-10-CM | POA: Diagnosis not present

## 2020-05-06 DIAGNOSIS — C20 Malignant neoplasm of rectum: Secondary | ICD-10-CM | POA: Diagnosis not present

## 2020-05-06 DIAGNOSIS — K651 Peritoneal abscess: Secondary | ICD-10-CM | POA: Diagnosis not present

## 2020-05-07 DIAGNOSIS — C20 Malignant neoplasm of rectum: Secondary | ICD-10-CM | POA: Diagnosis not present

## 2020-05-07 DIAGNOSIS — J9600 Acute respiratory failure, unspecified whether with hypoxia or hypercapnia: Secondary | ICD-10-CM | POA: Diagnosis not present

## 2020-05-07 DIAGNOSIS — N179 Acute kidney failure, unspecified: Secondary | ICD-10-CM | POA: Diagnosis not present

## 2020-05-07 DIAGNOSIS — K651 Peritoneal abscess: Secondary | ICD-10-CM | POA: Diagnosis not present

## 2020-05-07 NOTE — Progress Notes (Signed)
I spoke with patient's husband Quillian Quince regarding referral from Dr. Marcello Moores.  She is currently in rehab and he is thinking she will be there at least 2 more weeks.  He desires to schedule for 6/17 at 3 pm with Dr. Burr Medico.  He is aware of our location.  He will call me back if something happens in the interim that he would need to reschedule.

## 2020-05-08 DIAGNOSIS — N179 Acute kidney failure, unspecified: Secondary | ICD-10-CM | POA: Diagnosis not present

## 2020-05-08 DIAGNOSIS — K651 Peritoneal abscess: Secondary | ICD-10-CM | POA: Diagnosis not present

## 2020-05-08 DIAGNOSIS — J9600 Acute respiratory failure, unspecified whether with hypoxia or hypercapnia: Secondary | ICD-10-CM | POA: Diagnosis not present

## 2020-05-08 DIAGNOSIS — C20 Malignant neoplasm of rectum: Secondary | ICD-10-CM | POA: Diagnosis not present

## 2020-05-10 DIAGNOSIS — K651 Peritoneal abscess: Secondary | ICD-10-CM | POA: Diagnosis not present

## 2020-05-10 DIAGNOSIS — N179 Acute kidney failure, unspecified: Secondary | ICD-10-CM | POA: Diagnosis not present

## 2020-05-10 DIAGNOSIS — J9600 Acute respiratory failure, unspecified whether with hypoxia or hypercapnia: Secondary | ICD-10-CM | POA: Diagnosis not present

## 2020-05-10 DIAGNOSIS — C20 Malignant neoplasm of rectum: Secondary | ICD-10-CM | POA: Diagnosis not present

## 2020-05-13 DIAGNOSIS — C20 Malignant neoplasm of rectum: Secondary | ICD-10-CM | POA: Diagnosis not present

## 2020-05-13 DIAGNOSIS — J9600 Acute respiratory failure, unspecified whether with hypoxia or hypercapnia: Secondary | ICD-10-CM | POA: Diagnosis not present

## 2020-05-13 DIAGNOSIS — N179 Acute kidney failure, unspecified: Secondary | ICD-10-CM | POA: Diagnosis not present

## 2020-05-13 DIAGNOSIS — K651 Peritoneal abscess: Secondary | ICD-10-CM | POA: Diagnosis not present

## 2020-05-14 DIAGNOSIS — J9600 Acute respiratory failure, unspecified whether with hypoxia or hypercapnia: Secondary | ICD-10-CM | POA: Diagnosis not present

## 2020-05-14 DIAGNOSIS — N179 Acute kidney failure, unspecified: Secondary | ICD-10-CM | POA: Diagnosis not present

## 2020-05-14 DIAGNOSIS — C20 Malignant neoplasm of rectum: Secondary | ICD-10-CM | POA: Diagnosis not present

## 2020-05-14 DIAGNOSIS — K651 Peritoneal abscess: Secondary | ICD-10-CM | POA: Diagnosis not present

## 2020-05-15 DIAGNOSIS — N179 Acute kidney failure, unspecified: Secondary | ICD-10-CM | POA: Diagnosis not present

## 2020-05-15 DIAGNOSIS — K651 Peritoneal abscess: Secondary | ICD-10-CM | POA: Diagnosis not present

## 2020-05-15 DIAGNOSIS — C20 Malignant neoplasm of rectum: Secondary | ICD-10-CM | POA: Diagnosis not present

## 2020-05-15 DIAGNOSIS — J9600 Acute respiratory failure, unspecified whether with hypoxia or hypercapnia: Secondary | ICD-10-CM | POA: Diagnosis not present

## 2020-05-16 DIAGNOSIS — N179 Acute kidney failure, unspecified: Secondary | ICD-10-CM | POA: Diagnosis not present

## 2020-05-16 DIAGNOSIS — C20 Malignant neoplasm of rectum: Secondary | ICD-10-CM | POA: Diagnosis not present

## 2020-05-16 DIAGNOSIS — K651 Peritoneal abscess: Secondary | ICD-10-CM | POA: Diagnosis not present

## 2020-05-16 DIAGNOSIS — J9600 Acute respiratory failure, unspecified whether with hypoxia or hypercapnia: Secondary | ICD-10-CM | POA: Diagnosis not present

## 2020-05-20 DIAGNOSIS — K651 Peritoneal abscess: Secondary | ICD-10-CM | POA: Diagnosis not present

## 2020-05-20 DIAGNOSIS — J90 Pleural effusion, not elsewhere classified: Secondary | ICD-10-CM | POA: Diagnosis not present

## 2020-05-20 DIAGNOSIS — M6281 Muscle weakness (generalized): Secondary | ICD-10-CM | POA: Diagnosis not present

## 2020-05-20 DIAGNOSIS — R1310 Dysphagia, unspecified: Secondary | ICD-10-CM | POA: Diagnosis not present

## 2020-05-20 DIAGNOSIS — N179 Acute kidney failure, unspecified: Secondary | ICD-10-CM | POA: Diagnosis not present

## 2020-05-20 DIAGNOSIS — R69 Illness, unspecified: Secondary | ICD-10-CM | POA: Diagnosis not present

## 2020-05-20 DIAGNOSIS — E43 Unspecified severe protein-calorie malnutrition: Secondary | ICD-10-CM | POA: Diagnosis not present

## 2020-05-20 DIAGNOSIS — J449 Chronic obstructive pulmonary disease, unspecified: Secondary | ICD-10-CM | POA: Diagnosis not present

## 2020-05-20 DIAGNOSIS — J9600 Acute respiratory failure, unspecified whether with hypoxia or hypercapnia: Secondary | ICD-10-CM | POA: Diagnosis not present

## 2020-05-20 DIAGNOSIS — I82621 Acute embolism and thrombosis of deep veins of right upper extremity: Secondary | ICD-10-CM | POA: Diagnosis not present

## 2020-05-20 DIAGNOSIS — C20 Malignant neoplasm of rectum: Secondary | ICD-10-CM | POA: Diagnosis not present

## 2020-05-21 DIAGNOSIS — N179 Acute kidney failure, unspecified: Secondary | ICD-10-CM | POA: Diagnosis not present

## 2020-05-21 DIAGNOSIS — K651 Peritoneal abscess: Secondary | ICD-10-CM | POA: Diagnosis not present

## 2020-05-21 DIAGNOSIS — C20 Malignant neoplasm of rectum: Secondary | ICD-10-CM | POA: Diagnosis not present

## 2020-05-21 DIAGNOSIS — J9600 Acute respiratory failure, unspecified whether with hypoxia or hypercapnia: Secondary | ICD-10-CM | POA: Diagnosis not present

## 2020-05-22 ENCOUNTER — Encounter: Payer: Self-pay | Admitting: Family Medicine

## 2020-05-22 ENCOUNTER — Other Ambulatory Visit: Payer: Self-pay

## 2020-05-22 ENCOUNTER — Ambulatory Visit (INDEPENDENT_AMBULATORY_CARE_PROVIDER_SITE_OTHER): Payer: Medicare HMO | Admitting: Family Medicine

## 2020-05-22 VITALS — BP 108/70 | HR 99 | Temp 97.6°F | Resp 16 | Ht 63.0 in | Wt 115.1 lb

## 2020-05-22 DIAGNOSIS — C20 Malignant neoplasm of rectum: Secondary | ICD-10-CM

## 2020-05-22 DIAGNOSIS — M25552 Pain in left hip: Secondary | ICD-10-CM | POA: Diagnosis not present

## 2020-05-22 DIAGNOSIS — J449 Chronic obstructive pulmonary disease, unspecified: Secondary | ICD-10-CM | POA: Diagnosis not present

## 2020-05-22 DIAGNOSIS — I82621 Acute embolism and thrombosis of deep veins of right upper extremity: Secondary | ICD-10-CM

## 2020-05-22 DIAGNOSIS — Z0000 Encounter for general adult medical examination without abnormal findings: Secondary | ICD-10-CM | POA: Diagnosis not present

## 2020-05-22 DIAGNOSIS — I10 Essential (primary) hypertension: Secondary | ICD-10-CM | POA: Diagnosis not present

## 2020-05-22 DIAGNOSIS — Z933 Colostomy status: Secondary | ICD-10-CM | POA: Diagnosis not present

## 2020-05-22 DIAGNOSIS — R69 Illness, unspecified: Secondary | ICD-10-CM | POA: Diagnosis not present

## 2020-05-22 DIAGNOSIS — F064 Anxiety disorder due to known physiological condition: Secondary | ICD-10-CM

## 2020-05-22 NOTE — Progress Notes (Addendum)
Patient ID: Tracy [name], female  DOB: August 11, 1951, 69 y.o.   MRN: [id number] Patient Care Team    Relationship Specialty Notifications Start End  [name], DO PCP - General Family Medicine  05/21/20   Jonnie Finner, RN Oncology Nurse Navigator  All results, Admissions 05/07/20    Comment: Phone:  [phone]  Fax:  [phone]  [name], [place] Physician Hematology All results, Admissions 05/07/20   [name], DO Consulting Physician Pulmonary Disease  05/27/20   [facility]    05/27/20   [name], MD Consulting Physician General Surgery  06/13/15   [name], MD Consulting Physician General Surgery  05/27/20     Chief Complaint  Patient presents with  . Establish Care    No prior PCP. Went to [facility] pulm. Just got out rehab yesterday. Still having weakness.      Subjective:  Tracy [name] is a 69 y.o.  female present for new patient establishment. All past medical history, surgical history, allergies, family history, immunizations, medications and social history were updated in the electronic medical record today. All recent labs, ED visits and hospitalizations within the last year were reviewed.  Patient has a rather extensive and complicated medical history.  She has just been discharged from an [facility] after complicated admission for perforated colon/rectal cancer and ultimately colostomy.  She was also admitted for Pseudomonas pneumonia with sepsis, leading to respiratory failure and tracheostomy.  She unfortunately developed a blood clot surrounding her PICC line.  Patient reports she has been progressing with physical therapy and LTAC and now can stand with a walker.  They have been attempting to sit up PT and skilled nursing home health, but reports they are having trouble secondary to her insurance coverage.  Her husband has been taking care of her colostomy bag (instructed by LTAC nurses).  Patient has been using MiraLAX and  Colace and attempt to gain appropriate consistency of her bowel movements.  Currently there still more of a sticky solid formation. She has appointments scheduled for Dr. Cammy Copa in her heme/on Dr. Burr Medico.  She is now tolerating regular diet.  She reports her status is much improved but she still has some weakness.  COPD, severe ([place]) Patient under the care of pulmonology.    Acute deep vein thrombosis (DVT) of brachial vein of right upper extremity [facility]) Experienced a DVT at the site of her PICC line.  She is compliant with Eliquis 5 mg twice daily.  She does have left lower extremity swelling which she states has been present since early hospitalization.  Essential hypertension Pt reports compliance with amlodipine 10 mg daily and lisinopril 5 mg daily. Blood pressures ranges at home low normal. Patient denies chest pain, shortness of breath or lower extremity edema.  Prescribed Eliquis.  She is not on a statin.  Patient reports she did not have hypertension prior to her hospitalization with rectal cancer.  Colostomy status (HCC)/Rectal cancer [facility]) Patient is followed closely by her oncology team.  Anxiety disorder due to medical condition Patient was started on Cymbalta and Xanax during her hospitalization.  At one point she was also prescribed Klonopin in place of Xanax.  Patient reports she is doing better now and does not plan to continue the Xanax.  She has not used since she has been home.  Discussed Cymbalta use with her today and she would like to continue.    No flowsheet data found. No flowsheet  data found.     No flowsheet data found.   Immunization History  Administered Date(s) Administered  . Fluad Quad(high Dose 65+) 10/27/2019  . Influenza Split 01/06/2013, 09/13/2013  . Influenza, High Dose Seasonal PF 10/10/2018  . Influenza,inj,Quad PF,6+ Mos 09/13/2014, 10/15/2015  . Influenza,inj,Quad PF,6-35 Mos 10/26/2016  . Pneumococcal Conjugate-13 07/12/2014   . Pneumococcal Polysaccharide-23 01/06/2013, 10/10/2018  . Tdap 05/15/2013  . Zoster 07/14/2013    No exam data present  Past Medical History:  Diagnosis Date  . Acute deep vein thrombosis (DVT) of brachial vein of right upper extremity ([place])   . Acute on chronic respiratory failure with hypoxia ([place])   . Acute respiratory failure with hypoxemia ([place])   . Asthma   . CH[place]EN[place], HX OF 01/06/2011   Qualifier: Diagnosis of  By: Charlett Blake MD, Erline Levine    . COPD (chronic obstructive pulmonary disease) ([place]) 2011   FeV1 31% predicted FeV1/FVX 47 %  . COPD, severe ([place])   . Hemorrhoid   . Open right radial fracture 2020  . Osteopenia   . Perforated sigmoid colon ([place]) 03/03/2020  . Seasonal allergies    takes Claritin daily prn  . Shock circulatory ([place])   . Tracheostomy status ([place])   . Vitamin D deficiency    takes Vit d every 14 days   Allergies  Allergen Reactions  . Codeine Swelling   Past Surgical History:  Procedure Laterality Date  . AUGMENTATION MAMMAPLASTY     saline  . EXAMINATION UNDER ANESTHESIA  10/14/2012   Procedure: EXAM UNDER ANESTHESIA;  Surgeon: Gayland Curry, MD,FACS;  Location: [facility];  Service: General;  Laterality: N/A;  rectal exam under anesthesia excisional hemorrhoidectomy hemorrhoidal banding x two  . EXAMINATION UNDER ANESTHESIA  02/03/2013   excision hemorrhoidal tissue  . FOOT SURGERY     left bunionectomy  . HEMORRHOIDECTOMY WITH HEMORRHOID BANDING  10/14/2012   Procedure: HEMORRHOIDECTOMY WITH HEMORRHOID BANDING;  Surgeon: Gayland Curry, MD,FACS;  Location: [facility];  Service: General;  Laterality: N/A;  . IR THORACENTESIS ASP PLEURAL SPACE W/IMG GUIDE  04/23/2020  . LAPAROTOMY N/A 03/03/2020   Procedure: low anterior resection end colostomy;  Surgeon: [name], MD;  Location: [facility];  Service: General;  Laterality: N/A;  . LAPAROTOMY N/A 03/12/2020   Procedure: EXPLORATORY LAPAROTOMY WITH BOWEL RESECTION AND COLOSTOMY;  Surgeon: [name], MD;   Location: [facility];  Service: General;  Laterality: N/A;  . OPEN REDUCTION INTERNAL FIXATION (ORIF) DISTAL RADIAL FRACTURE Right 11/01/2018   Procedure: OPEN REDUCTION INTERNAL FIXATION (ORIF) DISTAL RADIAL FRACTURE;  Surgeon: [name], MD;  Location: [facility];  Service: Orthopedics;  Laterality: Right;  . TONSILLECTOMY  1960   recurrent otitis media   Family History  Problem Relation Age of Onset  . Osteoporosis Mother   . Other Father        [place] in snow, broke ankle, blood clot to lungs  . Heart murmur Sister   . Other Brother        h/o being hit by lightening  . Parkinsonism Brother   . Asthma Son   . Allergies Son   . Otitis media Son   . Other Sister        CML  . Asthma Son   . Allergies Son   . Pancreatic cancer Brother        diag 31-Dec-2013-deceased 03/02/14   Social History   Social History Narrative   Marital status/children/pets: Married.  2 children.  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   Safety:      -smoke alarm in the home:Yes     - wears seatbelt: Yes     - Feels safe in their relationships: Yes    All past medical history, surgical history, allergies, family history, immunizations andmedications were updated in the EMR today and reviewed under the history and medication portions of their EMR.      ROS: 14 pt review of systems performed and negative (unless mentioned in an HPI)  Objective: BP 108/70 (BP Location: Right Arm, Patient Position: Sitting, Cuff Size: Normal)   Pulse 99   Temp 97.6 F (36.4 C) (Temporal)   Resp 16   Ht 5\' 3"  (1.6 m)   Wt 115 lb 2 oz (52.2 kg)   LMP 12/14/2000   SpO2 (!) 89%   BMI 20.39 kg/m  Gen: Afebrile. No acute distress. Nontoxic in appearance, thin appearing, very pleasant female. HENT: AT. [place].  Neck/lymp/endocrine: Supple, no lymphadenopathy CV: RRR no murmur, left 1+ edema, +2/4 P posterior tibialis pulses.  Chest: CTAB, no wheeze, rhonchi or crackles.  Normal respiratory  effort.  Good air movement. Abd: Colostomy bag in place and draining appropriately Skin:  Warm and well-perfused. Skin intact. Neuro/Msk: In wheelchair today due to weakness-but was able to stand on her own for weigh-in . PERLA. EOMi. Alert. Oriented x3.   Psych: Normal affect, dress and demeanor. Normal speech. Normal thought content and judgment.   Assessment/plan: LEEANA CREER is a 69 y.o. female present for est care COPD, severe ([place]) Continue follow-ups with Dr. Valeta Harms  Acute deep vein thrombosis (DVT) of brachial vein of right upper extremity (HCC) Continue Eliquis 5 mg twice daily. Heme/onc to weigh in on appropriate treatment timeline.  Briefly discussed provoked versus unprovoked blood clot formation and treatment plans with her and her husband today.  Essential hypertension Blood pressure low normal today. Continue amlodipine 10 mg daily. Encouraged them to hold the lisinopril 5 mg-for now.  Will arrange close follow-up to further evaluate   Colostomy status (HCC)/Rectal cancer [facility]) Continue routine follow-ups with her surgical team and gastroenterologist. Continue pantoprazole for now for prophylaxis.  Anxiety disorder due to medical condition Patient seems to be adjusting to her diagnoses and condition. DC Xanax and Klonopin Continue Cymbalta> patient agreeable to continue may attempt to taper off after a few months.  Encouraged her to stay on this medication for now with all the changes she is experiencing would be recommended.  She is agreeable.     Return in about 1 month (around 06/24/2020) for [facility] (30 min).  No orders of the defined types were placed in this encounter.  Meds ordered this encounter  Medications  . apixaban (ELIQUIS) 5 MG TABS tablet    Sig: Take 1 tablet (5 mg total) by mouth 2 (two) times daily.    Dispense:  180 tablet    Refill:  1    Please hold until patient request  . amLODipine (NORVASC) 10 MG tablet    Sig: Take 1 tablet (10 mg  total) by mouth daily.    Dispense:  90 tablet    Refill:  1    Please hold until patient requests  . pantoprazole (PROTONIX) 40 MG tablet    Sig: Take 1 tablet (40 mg total) by mouth daily.    Dispense:  90 tablet    Refill:  3    Please hold until patient requests  . DULoxetine (CYMBALTA) 30 MG capsule  Sig: Take 1 capsule (30 mg total) by mouth daily.    Dispense:  90 capsule    Refill:  1    Please hold until patient requests   Referral Orders  No referral(s) requested today   > 70 Minutes was dedicated to this patient's encounter to include pre-visit review of chart, face-to-face time with patient and post-visit work- which include documentation and prescribing medications and/or ordering test when necessary.    Note is dictated utilizing voice recognition software. Although note has been proof read prior to signing, occasional typographical errors still can be missed. If any questions arise, please do not hesitate to call for verification.  Electronically signed by: Howard Pouch, DO [place]

## 2020-05-22 NOTE — Patient Instructions (Signed)
It was a pleasure to meet you both today.  Follow up in 4 weeks.   We will refill the meds we spoke of today.    Keep [name] updated on the home health and BP (if <110 routinely).

## 2020-05-23 ENCOUNTER — Encounter: Payer: Self-pay | Admitting: Family Medicine

## 2020-05-23 DIAGNOSIS — C20 Malignant neoplasm of rectum: Secondary | ICD-10-CM | POA: Insufficient documentation

## 2020-05-23 DIAGNOSIS — Z0000 Encounter for general adult medical examination without abnormal findings: Secondary | ICD-10-CM | POA: Insufficient documentation

## 2020-05-23 DIAGNOSIS — C19 Malignant neoplasm of rectosigmoid junction: Secondary | ICD-10-CM | POA: Insufficient documentation

## 2020-05-23 DIAGNOSIS — F064 Anxiety disorder due to known physiological condition: Secondary | ICD-10-CM | POA: Insufficient documentation

## 2020-05-23 DIAGNOSIS — I10 Essential (primary) hypertension: Secondary | ICD-10-CM

## 2020-05-23 DIAGNOSIS — Z933 Colostomy status: Secondary | ICD-10-CM | POA: Insufficient documentation

## 2020-05-23 HISTORY — DX: Essential (primary) hypertension: I10

## 2020-05-23 MED ORDER — APIXABAN 5 MG PO TABS: 5.0000 mg | ORAL_TABLET | Freq: Two times a day (BID) | ORAL | 1 refills | Status: DC

## 2020-05-23 MED ORDER — AMLODIPINE BESYLATE 10 MG PO TABS: 10.0000 mg | ORAL_TABLET | Freq: Every day | ORAL | 1 refills | Status: DC

## 2020-05-23 MED ORDER — PANTOPRAZOLE SODIUM 40 MG PO TBEC: 40.0000 mg | DELAYED_RELEASE_TABLET | Freq: Every day | ORAL | 3 refills | Status: DC

## 2020-05-23 MED ORDER — DULOXETINE HCL 30 MG PO CPEP
30.0000 mg | ORAL_CAPSULE | Freq: Every day | ORAL | 1 refills | Status: DC
Start: 2020-05-23 — End: 2020-07-02

## 2020-05-24 ENCOUNTER — Inpatient Hospital Stay: Payer: Medicare HMO | Admitting: Adult Health

## 2020-05-26 DIAGNOSIS — R69 Illness, unspecified: Secondary | ICD-10-CM | POA: Diagnosis not present

## 2020-05-26 DIAGNOSIS — E43 Unspecified severe protein-calorie malnutrition: Secondary | ICD-10-CM | POA: Diagnosis not present

## 2020-05-26 DIAGNOSIS — I82621 Acute embolism and thrombosis of deep veins of right upper extremity: Secondary | ICD-10-CM | POA: Diagnosis not present

## 2020-05-26 DIAGNOSIS — C20 Malignant neoplasm of rectum: Secondary | ICD-10-CM | POA: Diagnosis not present

## 2020-05-26 DIAGNOSIS — Z433 Encounter for attention to colostomy: Secondary | ICD-10-CM | POA: Diagnosis not present

## 2020-05-26 DIAGNOSIS — R1310 Dysphagia, unspecified: Secondary | ICD-10-CM | POA: Diagnosis not present

## 2020-05-26 DIAGNOSIS — J449 Chronic obstructive pulmonary disease, unspecified: Secondary | ICD-10-CM | POA: Diagnosis not present

## 2020-05-26 DIAGNOSIS — I10 Essential (primary) hypertension: Secondary | ICD-10-CM | POA: Diagnosis not present

## 2020-05-26 DIAGNOSIS — Z48815 Encounter for surgical aftercare following surgery on the digestive system: Secondary | ICD-10-CM | POA: Diagnosis not present

## 2020-05-27 ENCOUNTER — Encounter: Payer: Self-pay | Admitting: Family Medicine

## 2020-05-28 NOTE — Progress Notes (Signed)
Tracy [name]   [phone] Fax:([phone] [place] Note   Patient Care Team: [name], DO as PCP - General (Family Medicine) Jonnie Finner, RN as Oncology Nurse Navigator [name], MD as Consulting Physician (Hematology) Icard, Octavio Graves, DO as Consulting Physician (Pulmonary Disease) [place] [name], MD as Consulting Physician (General Surgery) [name], MD as Consulting Physician (General Surgery)  Date of Service:  05/30/2020   CHIEF COMPLAINTS/PURPOSE OF CONSULTATION:  Newly Diagnosed rectal cancer  REFERRING PHYSICIAN:  Dr Marcello Moores  Oncology History  Rectal cancer [facility])  03/03/2020 - 04/05/2020 Hospital Admission   She was admitted to ED on 03/03/20 for abdominal pain and nausea. She had been having diarrhea for several weeks. During hospital stay she developed acute respiratory failure, AKI, RUE DVT from PICC line. Work up showed bowel perforation, small left liver mass and thickening of jejunum. She underwent emergent surgery on 03/03/20 for resection and colostomy placement. Her path showed invasive cancer, metastatic to 3/15 LNs. She had a NGT in placed but this was removed on POD 3. Post op her stoma became necrotic and septic. She had another bowel surgery on 03/12/20, which was NED with necrotic tissue.    03/03/2020 Imaging   CT AP 03/03/20  IMPRESSION: 1. Free intraperitoneal air consistent perforation. Most likely site of perforation is in the distal splenic flexure/proximal descending colon where there is a collection of air and gas measuring 6 centimeters. No obvious soft tissue mass identified in this region. At this site, there is abrupt transition of dilated, stool-filled colon to completely decompressed proximal descending colon. 2. Thickened, inflamed loops of jejunum are identified within the pelvis and are likely reactive. 3. Small hiatal hernia. 4. Benign-appearing 1.6  centimeter mass the LEFT hepatic lobe. Recommend comparison with prior studies if available. 5.  Emphysema (ICD10-J43.9). 6.  Aortic Atherosclerosis (ICD10-I70.0). 7. Bilateral renal scarring.   03/03/2020 Surgery   low anterior resection end colostomy by Dr Marcello Moores    03/03/2020 Initial Biopsy   FINAL MICROSCOPIC DIAGNOSIS: 03/03/20 A. RECTOSIGMOID COLON, LOW ANTERIOR RESECTION:  - Invasive colonic adenocarcinoma, 3.5 cm.  - Tumor invades the visceral peritoneum.  - Margins of resection are not involved.  - Metastatic carcinoma in (3) of (15) lymph nodes.  - See oncology table.   B. ADDITIONAL SIGMOID COLON, RESECTION:  - Colonic tissue, negative for carcinoma.  ADDENDUM:  Mismatch Repair Protein (IHC)   SUMMARY INTERPRETATION: NORMAL  There is preserved expression of the major MMR proteins. There is a very  low probability that microsatellite instability (MSI) is present.  However, certain clinically significant MMR protein mutations may result  in preservation of nuclear expression. It is recommended that the  preservation of protein expression be correlated with molecular based  MSI testing.   IHC EXPRESSION RESULTS  TEST           RESULT  MLH1:          Preserved nuclear expression  MSH2:          Preserved nuclear expression  MSH6:          Preserved nuclear expression  PMS2:          Preserved nuclear expression   03/08/2020 Imaging   CT AP 03/08/20 IMPRESSION: 1. Interval midline laparotomy with distal colon resection and diverting left lower quadrant colostomy. 2. Diffuse small bowel dilatation with gas fluid levels most consistent with postoperative ileus. 3. Trace ascites within  the abdomen and pelvis. No fluid collection or abscess at this time. Surgical drain within the lower pelvis. 4. Indeterminate 1.4 cm subcapsular liver hypodensity. In light of newly diagnosed rectal cancer, metastatic disease cannot be excluded. PET CT may be useful for further  evaluation. 5. Interval development of trace bilateral pleural effusions and diffuse body wall edema.   03/12/2020 Surgery   EXPLORATORY LAPAROTOMY WITH BOWEL RESECTION AND COLOSTOMY by Dr Hassell Done 03/12/20  FINAL MICROSCOPIC DIAGNOSIS: 03/12/20  A. COLON, SPLENIC FLEXURE, RESECTION:  - Segment of colon (37 cm) with perforation and associated inflammation  - Multiple mucosal ulcers with necrotizing inflammation  - No evidence of malignancy    03/12/2020 Imaging   CT AP WO contrast 03/12/20  IMPRESSION: 1. Exam is limited by lack of intravenous contrast, motion, and artifact from the patient's arms adjacent to the torso. Since 03/08/2020, there has been development of relatively large pockets of intraperitoneal free air. While intraperitoneal gas would not be unexpected on postoperative day 9, this gas is new since an intervening study of 03/08/2020 and given the relatively large volume certainly raises concern for bowel perforation. No source for the intraperitoneal free air is evident on this study. There is a surgical drain in the pelvis, but there is no free gas around the drain itself to suggest that it represents the source. 2. New circumferential wall thickening in the splenic flexure, descending colon and sigmoid colon leading into the end colostomy. Infection/inflammation would be a consideration. Ischemia cannot be excluded. 3. Relatively small volume intraperitoneal free fluid. High attenuation small fluid collections in the left upper abdomen may reflect hemorrhage, infection or residua from prior perforation. 4. Interval progression of diffuse body wall edema. 5. Residual contrast material in the renal parenchyma from prior imaging, compatible with renal dysfunction.    03/19/2020 Imaging   CT CAP 03/19/20  IMPRESSION: 1. 5.5 x 3.2 cm fluid collection is noted along the greater curvature of the proximal stomach. 2. Surgical drain is again noted in the pelvis with tip in  left lower quadrant. 3. Interval development of crescent-shaped fluid collection measuring 16.4 x 3.3 cm in the epigastric region and left upper quadrant of the abdomen which may extend into the left lower quadrant. Potentially this may represent abscess or developing abscess. Multiple other smaller fluid collections are noted which may represent small abscesses. 4. 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 5. Mild amount of free fluid is noted in the posterior pelvis. 6. Moderate anasarca is noted. Aortic Atherosclerosis (ICD10-I70.0).   04/12/2020 Imaging   CT AP 04/12/20  IMPRESSION: 1. Fluid collections within the LEFT abdomen have significantly decreased compared to previous CT exams, now nearly completely resolved. 2. Percutaneous drainage catheter with tip coiled posterior to the LEFT kidney, stable positioning compared to the previous study. The more anterior catheter has been removed. 3. Small amount of free fluid persists within the abdomen and pelvis. 4. Trace bibasilar pleural effusions. 5. Anasarca. 6. While reviewing today's study, comparing with a chest/abdomen/pelvis CT from earlier same month, there is question of thrombus in the LEFT internal jugular vein. Recommend ultrasound of the LEFT IJ to exclude DVT. This recommendation discussed with patient's hospitalist, Dr. Owens Shark, on 04/12/2020 at 4:20 p.m.   Emphysema (ICD10-J43.9).   04/22/2020 Imaging   CT AP 04/22/20  IMPRESSION: 1. No recurrent intra-abdominal abscess status post interval removal of left retroperitoneal percutaneous drain. Stable small amount of free pelvic fluid. 2. Enlarging dependent bilateral pleural effusions, now moderate in  volume. Associated atelectasis at both lung bases. 3. Progressive anasarca with generalized edema throughout the subcutaneous fat. 4. Stable small subcapsular fluid collection along the anterior aspect of the left hepatic lobe. 5. Aortic Atherosclerosis  (ICD10-I70.0).   05/23/2020 Initial Diagnosis   Rectal cancer (HCC)      HISTORY OF PRESENTING ILLNESS:  Tracy [name] 69 y.o. female is a here because of rectal cancer. The patient was referred by Dr Marcello Moores. The patient presents to the clinic today accompanied by her husband.  She was having lower stomach aches without N&V and more constipated loose stool. Eventually she started having blood in her stool. She was set to be seen for this but got sick (severe pain, N&V, Diarrhea) 4 days before visit and went to ED. She was admitted to ED on 03/03/20 for abdominal pain and nausea. She had been having diarrhea for several weeks. During hospital stay she developed acute respiratory failure, AKI, RUE DVT from PICC line. Work up showed bowel perforation, small left liver mass and thickening of jejunum. She underwent emergent surgery on 03/03/20 for resection and colostomy placement. Her path showed invasive cancer, metastatic to 3/15 LNs. She had a NGT in placed but this was removed on POD 3. Post op her stoma became necrotic and septic. She had another bowel surgery on 03/12/20, which was NED with necrotic tissue. She required Trach and had it removed before discharge. Her incision has healed well now, but whistled while healing. After hospital discharge she went to [facility] rehab where she did PT and OT. She has been home now for 10 days.  Today she notes no pain or nausea. She is not on pain medications or antiemetics. Her husband notes she had pleural effusion during hospitalization and had Thoracentesis. He wonders was this tested for cancer cells.   Socially she is married with 3 adult children. She use to drink wine 1-2 times a day, but no longer drinks alcohol since cancer. She use to smoked less 1/4 ppd for 20 years and quit 1992. She is retired from [employer].  They have a PMHx of chronic asthma which lead to COPD. She has never needed hospitalization or on oxygen for this. She is on inhaler and  seen by pulmonologist. She broke her right arm and needed surgery in 2019. Her brother has pancreatic cancer at age 85 and her sister had leukemia. I reviewed medication list, she was started on amlodipine and Eliquis during hospitalization. She notes she was more anxious during hospital stay. This has improved since being home. She has Cymbalta and Xanax.     REVIEW OF SYSTEMS:    Constitutional: Denies fevers, chills or abnormal night sweats Eyes: Denies blurriness of vision, double vision or watery eyes Ears, nose, mouth, throat, and face: Denies mucositis or sore throat Respiratory: Denies cough, dyspnea or wheezes Cardiovascular: Denies palpitation, chest discomfort or lower extremity swelling Gastrointestinal:  Denies nausea, heartburn or change in bowel habits Skin: Denies abnormal skin rashes Lymphatics: Denies new lymphadenopathy or easy bruising Neurological:Denies numbness, tingling (+) Improved leg weakness  Behavioral/Psych: Mood is stable, no new changes (+) Anxiety.  All other systems were reviewed with the patient and are negative.   MEDICAL HISTORY:  Past Medical History:  Diagnosis Date  . Acute deep vein thrombosis (DVT) of brachial vein of right upper extremity ([place])   . Acute on chronic respiratory failure with hypoxia ([place])   . Acute respiratory failure with hypoxemia ([place])   . Asthma   .  [place], HX OF 01/06/2011   Qualifier: Diagnosis of  By: Charlett Blake MD, Erline Levine    . COPD (chronic obstructive pulmonary disease) ([place]) 2011   FeV1 31% predicted FeV1/FVX 47 %  . COPD, severe ([place])   . Hemorrhoid   . Open right radial fracture 2020  . Osteopenia   . Perforated sigmoid colon ([place]) 03/03/2020  . Pleural effusion   . Pneumonia 2021  . Postoperative intra-abdominal abscess 2021  . Seasonal allergies    takes Claritin daily prn  . Shock circulatory ([place])   . Tracheostomy status ([place])   . Vitamin D deficiency    takes Vit d every 14 days    SURGICAL  HISTORY: Past Surgical History:  Procedure Laterality Date  . AUGMENTATION MAMMAPLASTY     saline  . EXAMINATION UNDER ANESTHESIA  10/14/2012   Procedure: EXAM UNDER ANESTHESIA;  Surgeon: Gayland Curry, MD,FACS;  Location: [facility];  Service: General;  Laterality: N/A;  rectal exam under anesthesia excisional hemorrhoidectomy hemorrhoidal banding x two  . EXAMINATION UNDER ANESTHESIA  02/03/2013   excision hemorrhoidal tissue  . FOOT SURGERY     left bunionectomy  . HEMORRHOIDECTOMY WITH HEMORRHOID BANDING  10/14/2012   Procedure: HEMORRHOIDECTOMY WITH HEMORRHOID BANDING;  Surgeon: Gayland Curry, MD,FACS;  Location: [facility];  Service: General;  Laterality: N/A;  . IR THORACENTESIS ASP PLEURAL SPACE W/IMG GUIDE  04/23/2020  . LAPAROTOMY N/A 03/03/2020   Procedure: low anterior resection end colostomy;  Surgeon: [name], MD;  Location: [facility];  Service: General;  Laterality: N/A;  . LAPAROTOMY N/A 03/12/2020   Procedure: EXPLORATORY LAPAROTOMY WITH BOWEL RESECTION AND COLOSTOMY;  Surgeon: [name], MD;  Location: [facility];  Service: General;  Laterality: N/A;  . OPEN REDUCTION INTERNAL FIXATION (ORIF) DISTAL RADIAL FRACTURE Right 11/01/2018   Procedure: OPEN REDUCTION INTERNAL FIXATION (ORIF) DISTAL RADIAL FRACTURE;  Surgeon: [name], MD;  Location: [facility];  Service: Orthopedics;  Laterality: Right;  . TONSILLECTOMY  1960   recurrent otitis media  . US ECHOCARDIOGRAPHY  02/2020    poor windows, normal LV function, severely dilated RV with moderately reduced function, RV volume and pressure overload, mildly dilated RA    SOCIAL HISTORY: Social History   Socioeconomic History  . Marital status: Married    Spouse name: Not on file  . Number of children: 3  . Years of education: Not on file  . Highest education level: Not on file  Occupational History  . Occupation: real Conservation officer, historic buildings   Tobacco Use  . Smoking status: Former Smoker    Packs/day: 0.25    Years:  20.00    Pack years: 5.00    Types: Cigarettes    Quit date: 09/14/1991    Years since quitting: 28.7  . Smokeless tobacco: Never Used  Vaping Use  . Vaping Use: Never used  Substance and Sexual Activity  . Alcohol use: Yes    Alcohol/week: 7.0 standard drinks    Types: 7 Glasses of wine per week    Comment: stopped in 02/2020  . Drug use: No  . Sexual activity: Yes    Partners: Male    Birth control/protection: Post-menopausal  Other Topics Concern  . Not on file  Social History Narrative   Marital status/children/pets: Married.  2 children.   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   Safety:      -smoke alarm in the home:Yes     - wears seatbelt: Yes     -  Feels safe in their relationships: Yes   Social Determinants of Health   Financial Resource Strain:   . Difficulty of Paying Living Expenses:   Food Insecurity:   . Worried About Charity fundraiser in the Last Year:   . Arboriculturist in the Last Year:   Transportation Needs:   . Film/video editor (Medical):   Marland Kitchen Lack of Transportation (Non-Medical):   Physical Activity:   . Days of Exercise per Week:   . Minutes of Exercise per Session:   Stress:   . Feeling of Stress :   Social Connections:   . Frequency of Communication with Friends and Family:   . Frequency of Social Gatherings with Friends and Family:   . Attends Religious Services:   . Active Member of Clubs or Organizations:   . Attends Archivist Meetings:   Marland Kitchen Marital Status:   Intimate Partner Violence:   . Fear of Current or Ex-Partner:   . Emotionally Abused:   Marland Kitchen Physically Abused:   . Sexually Abused:     FAMILY HISTORY: Family History  Problem Relation Age of Onset  . Osteoporosis Mother   . Other Father        [place] in snow, broke ankle, blood clot to lungs  . Heart murmur Sister   . Cancer Sister        leukemia   . Other Brother        h/o being hit by lightening  . Parkinsonism Brother   . Asthma Son    . Allergies Son   . Otitis media Son   . Other Sister        CML  . Asthma Son   . Allergies Son   . Pancreatic cancer Brother 34       diag 12-07-13-deceased 02/06/2014    ALLERGIES:  is allergic to codeine.  MEDICATIONS:  Current Outpatient Medications  Medication Sig Dispense Refill  . ALPRAZolam (XANAX) 0.25 MG tablet Take 1 tablet (0.25 mg total) by mouth 2 (two) times daily as needed for anxiety. 40 tablet 0  . amLODipine (NORVASC) 10 MG tablet Take 1 tablet (10 mg total) by mouth daily. 90 tablet 1  . apixaban (ELIQUIS) 5 MG TABS tablet Take 1 tablet (5 mg total) by mouth 2 (two) times daily. 180 tablet 1  . docusate (COLACE) 50 MG/5ML liquid Place 10 mLs (100 mg total) into feeding tube 2 (two) times daily. 100 mL 0  . DULoxetine (CYMBALTA) 30 MG capsule Take 1 capsule (30 mg total) by mouth daily. 90 capsule 1  . fexofenadine (ALLEGRA) 180 MG tablet Take 1 tablet (180 mg total) by mouth daily. 30 tablet 5  . fluticasone (FLONASE) 50 MCG/ACT nasal spray Place 2 sprays into both nostrils daily. 16 mL 5  . Fluticasone-Umeclidin-Vilant (TRELEGY ELLIPTA) 100-62.5-25 MCG/INH AEPB Inhale 1 puff into the lungs daily.    Marland Kitchen ipratropium-albuterol (DUONEB) 0.5-2.5 (3) MG/3ML SOLN Take 3 mLs by nebulization every 4 (four) hours as needed. 360 mL   . pantoprazole (PROTONIX) 40 MG tablet Take 1 tablet (40 mg total) by mouth daily. 90 tablet 3   No current facility-administered medications for this visit.    PHYSICAL EXAMINATION: ECOG PERFORMANCE STATUS: 3 - Symptomatic, >50% confined to bed  Vitals:   05/30/20 1512  BP: 109/72  Pulse: 97  Resp: 18  Temp: 99 F (37.2 C)  SpO2: 92%   Filed Weights   05/30/20 1512  Weight: 115 lb  6.4 oz (52.3 kg)    GENERAL:alert, no distress and comfortable SKIN: skin color, texture, turgor are normal, no rashes or significant lesions EYES: normal, Conjunctiva are pink and non-injected, sclera clear  NECK: supple, thyroid normal size,  non-tender, without nodularity LYMPH:  no palpable lymphadenopathy in the cervical, axillary  LUNGS: clear to auscultation and percussion with normal breathing effort. No pleural effusion HEART: regular rate & rhythm and no murmurs (+) B/l ankle swelling lower extremity edema ABDOMEN:abdomen soft, non-tender and normal bowel sounds (+) Permeant colostomy in place (+)small open surgical wound healing well.  Musculoskeletal:no cyanosis of digits and no clubbing  NEURO: alert & oriented x 3 with fluent speech, no focal motor/sensory deficits (+) unsteady gate  LABORATORY DATA:  I have reviewed the data as listed CBC Latest Ref Rng & Units 04/28/2020 04/26/2020 04/23/2020  WBC 4.0 - 10.5 K/uL 11.4(H) 11.0(H) 16.7(H)  Hemoglobin 12.0 - 15.0 g/dL 10.7(L) 9.4(L) 9.4(L)  Hematocrit 36 - 46 % 32.4(L) 29.0(L) 30.2(L)  Platelets 150 - 400 K/uL 236 220 298    CMP Latest Ref Rng & Units 04/29/2020 04/28/2020 04/27/2020  Glucose 70 - 99 mg/dL 123(H) 145(H) -  BUN 8 - 23 mg/dL 13 13 -  Creatinine 0.44 - 1.00 mg/dL 0.43(L) 0.43(L) -  Sodium 135 - 145 mmol/L 135 135 -  Potassium 3.5 - 5.1 mmol/L 3.5 3.7 5.0  Chloride 98 - 111 mmol/L 101 97(L) -  CO2 22 - 32 mmol/L 28 28 -  Calcium 8.9 - 10.3 mg/dL 8.4(L) 8.8(L) -  Total Protein 6.5 - 8.1 g/dL - - -  Total Bilirubin 0.3 - 1.2 mg/dL - - -  Alkaline Phos 38 - 126 U/L - - -  AST 15 - 41 U/L - - -  ALT 0 - 44 U/L - - -     RADIOGRAPHIC STUDIES: I have personally reviewed the radiological images as listed and agreed with the findings in the report. DG Chest 2 View  Result Date: 05/29/2020 CLINICAL DATA:  Pleural effusion. EXAM: CHEST - 2 VIEW COMPARISON:  Radiograph 04/23/2020. Chest CT 03/19/2020 FINDINGS: Tracheostomy tube is been removed. Previous left pleural effusion has near completely resolved. There is blunting of both costophrenic angles which is may be related to hyperinflation versus small pleural effusions, but significantly improved from prior  on the left. Underlying emphysema with upper lobe scarring. Normal heart size and mediastinal contours. No pneumothorax or pulmonary edema. The bones are under mineralized. IMPRESSION: 1. Near-complete resolution of left pleural effusion. Mild blunting of both costophrenic angles may be related to hyperinflation versus small residual pleural fluid. 2. Emphysema with upper lobe scarring. Electronically Signed   By: Keith Rake M.D.   On: 05/29/2020 15:52    ASSESSMENT & PLAN:  KYNADI DRAGOS is a 69 y.o. Caucasian female with a history of RUE DVT, COPD, osteopenia   1. Rectosigmoid cancer (perforated), Stage III, pT4aN1b, with peroration, MSI Stable -I reviewed and discussed her image findings and pathology in great detail with her and her husband. She presented to ED on 03/03/20 due to severe pain, N/V/D. Workup showed she had bowel perforation and underwent emergent surgery. Pathology showed invasive rectosigmoid adenocarcinoma and perforated bowel with 3/15 positive LN. Margins were clear. She has permeant colostomy bag in place. Her initial CT scan was negative for distant mets -After surgery she had complications of infection/sepsis of colostomy which required another bowel resection, and had acute respiratory failure with pleural effusion, AKI, and RUE DVT. She  required prolonged hospital stay and rehabilitation. She back at home now, recovering.  -I discussed based on her surgical pathology she has stage III colon cancer and with bowel perforation she has very high risk of cancer recurrence, above 50%.   -I discussed standard care is adjuvant chemotherapy to reduce her risk of recurrence, but given her complications after surgery and long recovery, it has been almost 3 months after her surgery, and she is no longer in beneficial window (3 months) of chemo. Plus her current poor PS and remain open wound, I do not recommend chemo at this point.  -I recommend proceeding with cancer surveillance  at this point. I discussed the risk of cancer recurrence in the future. I discussed the surveillance plan, which is a physical exam and lab test (including CBC, CMP and CEA) every 3 months for the first 2 years, then every 6-12 months, colonoscopy in one year, and surveillance CT scan every 6-12 month for up to 5 year. She is agreeable.  -I discussed the [place] which is a pilot study to see how much tumor cells in his blood remain. With higher cell burden there is high risk of recurrence and with low burden she would have low risk of recurrence. I discussed the test result may not change what we do. She is interested.    -Physical exam shows she has small open surgical wound, healing well. Will obtain baseline labs next week. -F/u in 3 months with next CT scan. She will f/u with Dr Marcello Moores in interim.   2. Weakness, Low Weight  -With hospitalization, colon surgery and complications she has lost a lot of weight.   -I will send dietician referral.  -After hospital discharge she had significant leg weakness from prior swelling. She went to rehab and had OT and PT.  -She is home now, I encouraged her to be active. She has walker which I recommend she use  -I encouraged her to work on eating well, gaining weight and strength.   3. Provoked RUE DVT -On 04/04/20, during extended hospital stay the patient was noted to have RUE edema.  A vascular duplex was ordered but not completed until 04/05/2020. This revealed a right subclavian and right axillary vein DVT associated with her PICC line.  -She was started on Eliquis during hospitalization. She has b/l ankle swelling but worse in left ankle for the past 3 weeks. I recommend she continue Eliquis for now, given she is not as mobile.    4. Comorbidities: Asthma, COPD, Anxiety  -She has had significant asthma since she was a child which developed into COPD.  -She has not been hospitalized for exacerbation or needed home oxygen  -She is on  inhaler and managed by pulmonologist.  -She has had anxiety before but this significantly worsened during her hospitalization. She feels better since being home. On Cymbalta and Xanax 0.15m 1-2 daily. I reviewed Xanax is a controlled substance and can cause dependence and reviewed use with her.  -I discussed the opportunity to speak with our [place] may refer her to a specialist. She is open to this later.   4. Anemia  -Her 04/28/20 Hg at 10.7. Secondary to extended hospitalization, surgery and infection -Not currently on oral iron  -Will check labs and iron panel next week.   PLAN:  -I refilled Xanax today  -Lab next week including GuardantReveal testing  -Send dietician and SW referral.  -F/u in 3 months with lab and CT CAP  w contrast a few days before  -F/u with Dr Marcello Moores    Orders Placed This Encounter  Procedures  . CT Abdomen Pelvis W Contrast    Standing Status:   Future    Standing Expiration Date:   05/30/2021    Order Specific Question:   If indicated for the ordered procedure, I authorize the administration of contrast media per Radiology protocol    Answer:   Yes    Order Specific Question:   Preferred imaging location?    Answer:   [facility]    Order Specific Question:   Release to patient    Answer:   Immediate    Order Specific Question:   Is Oral Contrast requested for this exam?    Answer:   Yes, Per Radiology protocol    Order Specific Question:   Radiology Contrast Protocol - do NOT remove file path    Answer:   \\[email or web address]  . CBC with Differential ([place] Only)    Standing Status:   Standing    Number of Occurrences:   30    Standing Expiration Date:   05/30/2021  . CMP ([place] only)    Standing Status:   Standing    Number of Occurrences:   30    Standing Expiration Date:   05/30/2021  . Guardant 360    GuardantReveal the week of 6/21    Standing Status:   Future    Standing  Expiration Date:   05/30/2021  . CEA (IN HOUSE-[facility])    Standing Status:   Standing    Number of Occurrences:   30    Standing Expiration Date:   05/30/2021  . Ferritin    Standing Status:   Standing    Number of Occurrences:   30    Standing Expiration Date:   05/30/2021  . Iron and TIBC    Standing Status:   Standing    Number of Occurrences:   30    Standing Expiration Date:   05/30/2021    All questions were answered. The patient knows to call the clinic with any problems, questions or concerns. The total time spent in the appointment was 60 minutes.     [name], MD 05/30/2020   I, Joslyn Devon, am acting as scribe for [name], MD.   I have reviewed the above documentation for accuracy and completeness, and I agree with the above.

## 2020-05-29 ENCOUNTER — Ambulatory Visit: Payer: Medicare HMO | Admitting: Adult Health

## 2020-05-29 ENCOUNTER — Ambulatory Visit (INDEPENDENT_AMBULATORY_CARE_PROVIDER_SITE_OTHER): Payer: Medicare HMO

## 2020-05-29 ENCOUNTER — Other Ambulatory Visit: Payer: Self-pay

## 2020-05-29 ENCOUNTER — Encounter: Payer: Self-pay | Admitting: Adult Health

## 2020-05-29 VITALS — BP 116/64 | HR 104 | Ht 62.0 in | Wt 113.0 lb

## 2020-05-29 DIAGNOSIS — J90 Pleural effusion, not elsewhere classified: Secondary | ICD-10-CM

## 2020-05-29 DIAGNOSIS — J189 Pneumonia, unspecified organism: Secondary | ICD-10-CM | POA: Diagnosis not present

## 2020-05-29 DIAGNOSIS — J449 Chronic obstructive pulmonary disease, unspecified: Secondary | ICD-10-CM

## 2020-05-29 DIAGNOSIS — J439 Emphysema, unspecified: Secondary | ICD-10-CM | POA: Diagnosis not present

## 2020-05-29 DIAGNOSIS — C20 Malignant neoplasm of rectum: Secondary | ICD-10-CM | POA: Diagnosis not present

## 2020-05-29 MED ORDER — FLUTICASONE PROPIONATE 50 MCG/ACT NA SUSP: 2.0000 | Freq: Every day | NASAL | 5 refills | Status: DC

## 2020-05-29 MED ORDER — FEXOFENADINE HCL 180 MG PO TABS: 180.0000 mg | ORAL_TABLET | Freq: Every day | ORAL | 5 refills | Status: DC

## 2020-05-29 NOTE — Assessment & Plan Note (Signed)
Very severe COPD with a recent hospitalization for acute respiratory failure. Patient needs to return back to her maintenance therapy.  Would discontinue theophylline.  Restart Trelegy inhaler as she has this at home. Use her albuterol inhaler or nebulizer as needed  Plan  Patient Instructions  Stop Theophylline and Pulmicort .  Restart TRELEGY 1 puff daily  Restart Allegra 180mg  daily  Restart Flonase Nasal 2 puff daily .  Mucinex DM Twice daily  As needed  Cough/congestion .  Follow up with Oncology as planned.  Chest xray today .  Follow up with Dr. Valeta Harms in 4 weeks and As needed   Please contact office for sooner follow up if symptoms do not improve or worsen or seek emergency care

## 2020-05-29 NOTE — Assessment & Plan Note (Signed)
Follow-up with oncology as planned and

## 2020-05-29 NOTE — Progress Notes (Signed)
@Patient  ID: Tracy [name], female    DOB: 04-08-1951, 69 y.o.   MRN: [id number]  Chief Complaint  Patient presents with  . 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, metastatic carcinoma in 3/15 lymph nodes  LE [name] 3/27 >> negative for DVT  ECHO 3/27 >> poor windows, normal LV function, severely dilated RV with moderately reduced function, RV volume and pressure overload, mildly dilated RA  V/Q Scan 3/30 >> low probability for PE  4/6 CT chest abdomen pelvis> 5.5x3.2 fluid collection along stomach, surgical drain again noted in pelvis, interval development of crescent shaped fluid collection measuring 16.4 x 3.3 in epigastric region and left upper quadrant of abdomen (could be abscess) multiple other smaller fluid collections, colostomy noted, mild free fluid in posterior pelvis Tracheal Aspirate 3/29 >> Saccharomyces cerevisiae Trach aspirate 4/2 >> saccharomyces cerevisiae abscess abdomen 4/7 >> pseudomonas  05/29/2020 Follow up : COPD  Patient returns for a follow-up visit.  She was last seen in October 05 2019.  Unfortunately since last visit patient has had a hospitalization for critical illness.  She had a prolonged hospitalization for critical illness and LTAC stay after she had a perforated colon due to colon and rectal cancer.  She has a colostomy now.  Her hospital stay was complicated by acute respiratory failure,  pneumonia with sepsis requiring tracheostomy and eventual decannulation.  She had a right upper extremity DVT felt secondary to PICC line.  She has now on anticoagulation.  It does appear that patient had bilateral pleural effusions during her LTAC stay and required a thoracentesis with 400 cc of pleural fluid  removed.  Unfortunately we do not have the records and do not see if cytology or fluid analysis was sent.  Patient's husband reports he was not notified of any results done on May 11.  She has significant physical deconditioning and is receiving physical therapy now at home.  Her husband is at home and is helping her.  She has an upcoming appointment with oncology Dr. Jacklynn Bue G Prior to hospitalization she had been taking Trelegy inhaler.  Since discharge she only has her albuterol nebulizer.  She is also been started on theophylline.  She was also started on budesonide and Brovana however does not appear that she has these. She says she gets very short of breath with minimal activity.  She denies any increased cough or congestion.  She is remains very weak and has to have assistance with standing.  Does use a walker for short distance but needs assistance with this. She is on ACE inhibitor but denies any increased cough.   Allergies  Allergen Reactions  . Codeine Swelling    Immunization History  Administered Date(s) Administered  . Fluad Quad(high Dose 65+) 10/27/2019  . Influenza Split 01/06/2013, 09/13/2013  . Influenza, High Dose Seasonal PF 10/10/2018  . Influenza,inj,Quad PF,6+ Mos 09/13/2014, 10/15/2015  . Influenza,inj,Quad PF,6-35 Mos 10/26/2016  . Moderna SARS-COVID-2 Vaccination 01/09/2020, 02/06/2020  . Pneumococcal Conjugate-13 07/12/2014  . Pneumococcal Polysaccharide-23 01/06/2013, 10/10/2018  . Tdap 05/15/2013  . Zoster 07/14/2013    Past Medical History:  Diagnosis Date  . Acute deep vein thrombosis (DVT) of brachial vein of right upper extremity ([place])   . Acute on chronic  respiratory failure with hypoxia ([place])   . Acute respiratory failure with hypoxemia ([place])   . Asthma   . CHICKENPOX, HX OF 01/06/2011   Qualifier: Diagnosis of  By: Charlett Blake MD, Erline Levine    . COPD (chronic obstructive pulmonary disease) ([place]) 2011   FeV1 31% predicted FeV1/FVX 47 %  . COPD, severe ([place])     . Hemorrhoid   . Open right radial fracture 2020  . Osteopenia   . Perforated sigmoid colon ([place]) 03/03/2020  . Seasonal allergies    takes Claritin daily prn  . Shock circulatory ([place])   . Tracheostomy status ([place])   . Vitamin D deficiency    takes Vit d every 14 days    Tobacco History: Social History   Tobacco Use  Smoking Status Former Smoker  . Packs/day: 0.50  . Years: 20.00  . Pack years: 10.00  . Types: Cigarettes  . Quit date: 09/14/1999  . Years since quitting: 20.7  Smokeless Tobacco Never Used   Counseling given: Not Answered   Outpatient Medications Prior to Visit  Medication Sig Dispense Refill  . amLODipine (NORVASC) 10 MG tablet Take 1 tablet (10 mg total) by mouth daily. 90 tablet 1  . apixaban (ELIQUIS) 5 MG TABS tablet Take 1 tablet (5 mg total) by mouth 2 (two) times daily. 180 tablet 1  . budesonide (PULMICORT) 0.5 MG/2ML nebulizer solution Take 2 mLs (0.5 mg total) by nebulization 2 (two) times daily.  12  . docusate (COLACE) 50 MG/5ML liquid Place 10 mLs (100 mg total) into feeding tube 2 (two) times daily. 100 mL 0  . DULoxetine (CYMBALTA) 30 MG capsule Take 1 capsule (30 mg total) by mouth daily. 90 capsule 1  . ipratropium-albuterol (DUONEB) 0.5-2.5 (3) MG/3ML SOLN Take 3 mLs by nebulization every 4 (four) hours as needed. 360 mL   . pantoprazole (PROTONIX) 40 MG tablet Take 1 tablet (40 mg total) by mouth daily. 90 tablet 3  . theophylline (UNIPHYL) 400 MG 24 hr tablet Take 200 mg by mouth daily.    Marland Kitchen arformoterol (BROVANA) 15 MCG/2ML NEBU Take 2 mLs (15 mcg total) by nebulization 2 (two) times daily. 120 mL   . polyethylene glycol (MIRALAX / GLYCOLAX) 17 g packet Place 17 g into feeding tube daily. 14 each 0   No facility-administered medications prior to visit.     Review of Systems:   Constitutional:   No  weight loss, night sweats,  Fevers, chills, + fatigue, or  lassitude.  HEENT:   No headaches,  Difficulty swallowing,  Tooth/dental  problems, or  Sore throat,                No sneezing, itching, ear ache, nasal congestion, post nasal drip,   CV:  No chest pain,  Orthopnea, PND, swelling in lower extremities, anasarca, dizziness, palpitations, syncope.   GI  No heartburn, indigestion, abdominal pain, nausea, vomiting, diarrhea, change in bowel habits, loss of appetite, bloody stools.   Resp:    No chest wall deformity  Skin: no rash or lesions.  GU: no dysuria, change in color of urine, no urgency or frequency.  No flank pain, no hematuria   MS:  No joint pain or swelling.  No decreased range of motion.  No back pain.    Physical Exam  BP 116/64 (BP Location: Right Arm, Cuff Size: Normal)   Pulse (!) 104   Ht 5\' 2"  (1.575 m)   Wt 113 lb (51.3 kg)   LMP  12/14/2000   SpO2 91%   BMI 20.67 kg/m   GEN: A/Ox3; pleasant , NAD, thin female in wheelchair   HEENT:  [place]/AT,   NOSE-clear, THROAT-clear, no lesions, no postnasal drip or exudate noted.   NECK:  Supple w/ fair ROM; no JVD; normal carotid impulses w/o bruits; no thyromegaly or nodules palpated; no lymphadenopathy.    RESP decreased breath sounds in the bases  no accessory muscle use, no dullness to percussion  CARD:  RRR, no m/r/g, no peripheral edema, pulses intact, no cyanosis or clubbing.  GI:   Soft & nt; nml bowel sounds; no organomegaly or masses detected.  Colostomy  Musco: Warm bil, no deformities or joint swelling noted.   Neuro: alert, no focal deficits noted.    Skin: Warm, no lesions or rashes    Lab Results:  CBC   BNP  ProBNP No results found for: PROBNP  Imaging: No results found.    No flowsheet data found.  No results found for: NITRICOXIDE      Assessment & Plan:   COPD, severe ([place]) Very severe COPD with a recent hospitalization for acute respiratory failure. Patient needs to return back to her maintenance therapy.  Would discontinue theophylline.  Restart Trelegy inhaler as she has this at home. Use  her albuterol inhaler or nebulizer as needed  Plan  Patient Instructions  Stop Theophylline and Pulmicort .  Restart TRELEGY 1 puff daily  Restart Allegra 180mg  daily  Restart Flonase Nasal 2 puff daily .  Mucinex DM Twice daily  As needed  Cough/congestion .  Follow up with Oncology as planned.  Chest xray today .  Follow up with Dr. Valeta Harms in 4 weeks and As needed   Please contact office for sooner follow up if symptoms do not improve or worsen or seek emergency care       Rectal cancer [facility]) Follow-up with oncology as planned and  Pneumonia Pneumonia clinically improved after antibiotics.  Will check chest x-ray today.  Pleural effusion Bilateral pleural effusion status post thoracentesis Apr 23, 2020-no data from fluid analysis or cytology was failed.  We will check chest x-ray to see if effusions are still present. Pending these results we will decide on the next step.     Rexene Edison, NP 05/29/2020

## 2020-05-29 NOTE — Assessment & Plan Note (Signed)
Pneumonia clinically improved after antibiotics.  Will check chest x-ray today.

## 2020-05-29 NOTE — Patient Instructions (Signed)
Stop Theophylline and Pulmicort .  Restart TRELEGY 1 puff daily  Restart Allegra 180mg  daily  Restart Flonase Nasal 2 puff daily .  Mucinex DM Twice daily  As needed  Cough/congestion .  Follow up with Oncology as planned.  Chest xray today .  Follow up with Dr. Valeta Harms in 4 weeks and As needed   Please contact office for sooner follow up if symptoms do not improve or worsen or seek emergency care

## 2020-05-29 NOTE — Assessment & Plan Note (Signed)
Bilateral pleural effusion status post thoracentesis Apr 23, 2020-no data from fluid analysis or cytology was failed.  We will check chest x-ray to see if effusions are still present. Pending these results we will decide on the next step.

## 2020-05-30 ENCOUNTER — Other Ambulatory Visit: Payer: Self-pay

## 2020-05-30 ENCOUNTER — Encounter: Payer: Self-pay | Admitting: Family Medicine

## 2020-05-30 ENCOUNTER — Inpatient Hospital Stay: Payer: Medicare HMO | Attending: Hematology | Admitting: Hematology

## 2020-05-30 VITALS — BP 109/72 | HR 97 | Temp 99.0°F | Resp 18 | Ht 62.0 in | Wt 115.4 lb

## 2020-05-30 DIAGNOSIS — M25471 Effusion, right ankle: Secondary | ICD-10-CM | POA: Diagnosis not present

## 2020-05-30 DIAGNOSIS — I82621 Acute embolism and thrombosis of deep veins of right upper extremity: Secondary | ICD-10-CM

## 2020-05-30 DIAGNOSIS — R531 Weakness: Secondary | ICD-10-CM | POA: Diagnosis not present

## 2020-05-30 DIAGNOSIS — Z87891 Personal history of nicotine dependence: Secondary | ICD-10-CM

## 2020-05-30 DIAGNOSIS — Z7901 Long term (current) use of anticoagulants: Secondary | ICD-10-CM | POA: Insufficient documentation

## 2020-05-30 DIAGNOSIS — Z933 Colostomy status: Secondary | ICD-10-CM | POA: Insufficient documentation

## 2020-05-30 DIAGNOSIS — D649 Anemia, unspecified: Secondary | ICD-10-CM | POA: Diagnosis not present

## 2020-05-30 DIAGNOSIS — Z800 Family history of malignant neoplasm of digestive organs: Secondary | ICD-10-CM | POA: Insufficient documentation

## 2020-05-30 DIAGNOSIS — J449 Chronic obstructive pulmonary disease, unspecified: Secondary | ICD-10-CM | POA: Diagnosis not present

## 2020-05-30 DIAGNOSIS — M8580 Other specified disorders of bone density and structure, unspecified site: Secondary | ICD-10-CM

## 2020-05-30 DIAGNOSIS — Z806 Family history of leukemia: Secondary | ICD-10-CM | POA: Diagnosis not present

## 2020-05-30 DIAGNOSIS — Z79899 Other long term (current) drug therapy: Secondary | ICD-10-CM

## 2020-05-30 DIAGNOSIS — C19 Malignant neoplasm of rectosigmoid junction: Secondary | ICD-10-CM

## 2020-05-30 DIAGNOSIS — F419 Anxiety disorder, unspecified: Secondary | ICD-10-CM | POA: Insufficient documentation

## 2020-05-30 DIAGNOSIS — R634 Abnormal weight loss: Secondary | ICD-10-CM

## 2020-05-30 DIAGNOSIS — M25472 Effusion, left ankle: Secondary | ICD-10-CM

## 2020-05-30 DIAGNOSIS — C20 Malignant neoplasm of rectum: Secondary | ICD-10-CM

## 2020-05-30 MED ORDER — ALPRAZOLAM 0.25 MG PO TABS
0.2500 mg | ORAL_TABLET | Freq: Two times a day (BID) | ORAL | 0 refills | Status: DC | PRN
Start: 2020-05-30 — End: 2020-07-12

## 2020-05-30 NOTE — Progress Notes (Signed)
Thanks for seeing her. Looks like she was de-cannulated from trach.  Glad to see her improving some.  [facility] 05/30/2020 11:03 AM

## 2020-05-30 NOTE — Progress Notes (Signed)
Met with patient and husband Quillian Quince at medical oncology consult with Dr. Burr Medico today.  I explained my role as nurse navigator and they were given my card with my direct contact information.  We talked about her nutritional intake.  She states it is coming back slowly.  I explained that during her long stay in long term care that she has lost a great deal of muscle mass and weight.  I suggested that she supplement her diet with increased protein in the form of homemade smoothies with adding a scoop of protein powder or prepackaged protein shakes.  She states she feels she would like the smoothies and will try them at home.

## 2020-05-31 ENCOUNTER — Telehealth: Payer: Self-pay | Admitting: Hematology

## 2020-05-31 ENCOUNTER — Telehealth: Payer: Self-pay | Admitting: General Practice

## 2020-05-31 ENCOUNTER — Other Ambulatory Visit: Payer: Self-pay

## 2020-05-31 DIAGNOSIS — Z433 Encounter for attention to colostomy: Secondary | ICD-10-CM | POA: Diagnosis not present

## 2020-05-31 DIAGNOSIS — I82621 Acute embolism and thrombosis of deep veins of right upper extremity: Secondary | ICD-10-CM | POA: Diagnosis not present

## 2020-05-31 DIAGNOSIS — R1310 Dysphagia, unspecified: Secondary | ICD-10-CM | POA: Diagnosis not present

## 2020-05-31 DIAGNOSIS — I10 Essential (primary) hypertension: Secondary | ICD-10-CM | POA: Diagnosis not present

## 2020-05-31 DIAGNOSIS — C20 Malignant neoplasm of rectum: Secondary | ICD-10-CM | POA: Diagnosis not present

## 2020-05-31 DIAGNOSIS — E43 Unspecified severe protein-calorie malnutrition: Secondary | ICD-10-CM | POA: Diagnosis not present

## 2020-05-31 DIAGNOSIS — J449 Chronic obstructive pulmonary disease, unspecified: Secondary | ICD-10-CM | POA: Diagnosis not present

## 2020-05-31 DIAGNOSIS — R69 Illness, unspecified: Secondary | ICD-10-CM | POA: Diagnosis not present

## 2020-05-31 DIAGNOSIS — Z48815 Encounter for surgical aftercare following surgery on the digestive system: Secondary | ICD-10-CM | POA: Diagnosis not present

## 2020-05-31 NOTE — Progress Notes (Signed)
Ambulatory referral to dietician and CSW made.

## 2020-05-31 NOTE — Telephone Encounter (Signed)
[place] CSW Progress Notes  Call to patient per referral from Dr Burr Medico.  Spoke w husband - he requested SW call back on Monday as patient has multiple home health providers visiting today.  Will ask CSW [place] to follow up early next week.  Edwyna Shell, LCSW Clinical Social Worker Phone:  [phone] Cell:  [phone]

## 2020-05-31 NOTE — Telephone Encounter (Signed)
Scheduled per 6/18 sch message. Pt requested phone visit for nutrition. Pt is aware of appt.

## 2020-06-01 DIAGNOSIS — Z933 Colostomy status: Secondary | ICD-10-CM | POA: Diagnosis not present

## 2020-06-03 ENCOUNTER — Encounter: Payer: Self-pay | Admitting: *Deleted

## 2020-06-03 ENCOUNTER — Telehealth: Payer: Self-pay

## 2020-06-03 NOTE — Progress Notes (Signed)
[place] Work  Clinical Social Work received referral from medical oncology for emotional support and resources.  CSW contacted patient at home to offer support and assess for needs.  CSW spoke with patient and patients husband.  Patient stated she was "doing ok for now".  Patient and husband stated they were still waiting to get all of the test results and treatment plan information, and could not identify any needs at this time.  CSW provided brief education on CSW role and information on the support team at [facility].  Patient and patients husband were appreciative of CSW call, and were agreeable to CSW mailing a packet with [facility] support team/resource information.  Patient and husband welcomed CSW check in's once treatment plan is established.    Johnnye Lana, MSW, LCSW, OSW-C Clinical Social Worker [facility] [phone]

## 2020-06-03 NOTE — Telephone Encounter (Signed)
Home health orders received 06/03/2020 for [place] health initiation orders: Cert and Plan of care  Home health re-certification orders: No  Patient last seen by ordering physician for this condition: 05/22/2020  Must be less than 90 days for re-certification and less than 30 days prior for initiation. Visit must have been for the condition the orders are being placed.  Patient meets criteria for Physician to sign orders: Yes        Current med list has been attached: yes        Orders placed on physicians desk for signature: 06/03/2020   Caroll Rancher LPN

## 2020-06-04 DIAGNOSIS — F329 Major depressive disorder, single episode, unspecified: Secondary | ICD-10-CM

## 2020-06-04 DIAGNOSIS — T8189XA Other complications of procedures, not elsewhere classified, initial encounter: Secondary | ICD-10-CM | POA: Diagnosis not present

## 2020-06-04 DIAGNOSIS — I82621 Acute embolism and thrombosis of deep veins of right upper extremity: Secondary | ICD-10-CM

## 2020-06-04 DIAGNOSIS — R69 Illness, unspecified: Secondary | ICD-10-CM | POA: Diagnosis not present

## 2020-06-04 DIAGNOSIS — Z9181 History of falling: Secondary | ICD-10-CM

## 2020-06-04 DIAGNOSIS — R1310 Dysphagia, unspecified: Secondary | ICD-10-CM

## 2020-06-04 DIAGNOSIS — I10 Essential (primary) hypertension: Secondary | ICD-10-CM

## 2020-06-04 DIAGNOSIS — R971 Elevated cancer antigen 125 [CA 125]: Secondary | ICD-10-CM

## 2020-06-04 DIAGNOSIS — C20 Malignant neoplasm of rectum: Secondary | ICD-10-CM | POA: Diagnosis not present

## 2020-06-04 DIAGNOSIS — K219 Gastro-esophageal reflux disease without esophagitis: Secondary | ICD-10-CM

## 2020-06-04 DIAGNOSIS — Z433 Encounter for attention to colostomy: Secondary | ICD-10-CM | POA: Diagnosis not present

## 2020-06-04 DIAGNOSIS — E43 Unspecified severe protein-calorie malnutrition: Secondary | ICD-10-CM

## 2020-06-04 DIAGNOSIS — Z48815 Encounter for surgical aftercare following surgery on the digestive system: Secondary | ICD-10-CM | POA: Diagnosis not present

## 2020-06-04 DIAGNOSIS — F419 Anxiety disorder, unspecified: Secondary | ICD-10-CM

## 2020-06-04 DIAGNOSIS — Z7901 Long term (current) use of anticoagulants: Secondary | ICD-10-CM

## 2020-06-04 DIAGNOSIS — J449 Chronic obstructive pulmonary disease, unspecified: Secondary | ICD-10-CM | POA: Diagnosis not present

## 2020-06-04 NOTE — Telephone Encounter (Signed)
Completed and returned to [place] work station.

## 2020-06-05 DIAGNOSIS — T8189XA Other complications of procedures, not elsewhere classified, initial encounter: Secondary | ICD-10-CM | POA: Diagnosis not present

## 2020-06-05 DIAGNOSIS — Z933 Colostomy status: Secondary | ICD-10-CM | POA: Diagnosis not present

## 2020-06-05 NOTE — Telephone Encounter (Signed)
Document faxed to [facility] agency. Sent copy for scan

## 2020-06-06 ENCOUNTER — Inpatient Hospital Stay: Payer: Medicare HMO

## 2020-06-06 ENCOUNTER — Other Ambulatory Visit: Payer: Self-pay

## 2020-06-06 DIAGNOSIS — Z933 Colostomy status: Secondary | ICD-10-CM | POA: Diagnosis not present

## 2020-06-06 DIAGNOSIS — J449 Chronic obstructive pulmonary disease, unspecified: Secondary | ICD-10-CM | POA: Diagnosis not present

## 2020-06-06 DIAGNOSIS — C19 Malignant neoplasm of rectosigmoid junction: Secondary | ICD-10-CM | POA: Diagnosis not present

## 2020-06-06 DIAGNOSIS — D649 Anemia, unspecified: Secondary | ICD-10-CM | POA: Diagnosis not present

## 2020-06-06 DIAGNOSIS — M8580 Other specified disorders of bone density and structure, unspecified site: Secondary | ICD-10-CM | POA: Diagnosis not present

## 2020-06-06 DIAGNOSIS — M25471 Effusion, right ankle: Secondary | ICD-10-CM | POA: Diagnosis not present

## 2020-06-06 DIAGNOSIS — Z87891 Personal history of nicotine dependence: Secondary | ICD-10-CM | POA: Diagnosis not present

## 2020-06-06 DIAGNOSIS — R634 Abnormal weight loss: Secondary | ICD-10-CM | POA: Diagnosis not present

## 2020-06-06 DIAGNOSIS — R531 Weakness: Secondary | ICD-10-CM | POA: Diagnosis not present

## 2020-06-06 DIAGNOSIS — M25472 Effusion, left ankle: Secondary | ICD-10-CM | POA: Diagnosis not present

## 2020-06-06 DIAGNOSIS — C20 Malignant neoplasm of rectum: Secondary | ICD-10-CM

## 2020-06-06 LAB — CBC WITH DIFFERENTIAL (CANCER CENTER ONLY)
Abs Immature Granulocytes: 0.02 10*3/uL (ref 0.00–0.07)
Basophils Absolute: 0.1 10*3/uL (ref 0.0–0.1)
Basophils Relative: 1 %
Eosinophils Absolute: 0.1 10*3/uL (ref 0.0–0.5)
Eosinophils Relative: 1 %
HCT: 38.3 % (ref 36.0–46.0)
Hemoglobin: 12 g/dL (ref 12.0–15.0)
Immature Granulocytes: 0 %
Lymphocytes Relative: 32 %
Lymphs Abs: 2.6 10*3/uL (ref 0.7–4.0)
MCH: 30.2 pg (ref 26.0–34.0)
MCHC: 31.3 g/dL (ref 30.0–36.0)
MCV: 96.5 fL (ref 80.0–100.0)
Monocytes Absolute: 0.7 10*3/uL (ref 0.1–1.0)
Monocytes Relative: 9 %
Neutro Abs: 4.8 10*3/uL (ref 1.7–7.7)
Neutrophils Relative %: 57 %
Platelet Count: 470 10*3/uL — ABNORMAL HIGH (ref 150–400)
RBC: 3.97 MIL/uL (ref 3.87–5.11)
RDW: 15.5 % (ref 11.5–15.5)
WBC Count: 8.3 10*3/uL (ref 4.0–10.5)
nRBC: 0 % (ref 0.0–0.2)

## 2020-06-06 LAB — IRON AND TIBC
Iron: 58 ug/dL (ref 41–142)
Saturation Ratios: 22 % (ref 21–57)
TIBC: 268 ug/dL (ref 236–444)
UIBC: 209 ug/dL (ref 120–384)

## 2020-06-06 LAB — CMP (CANCER CENTER ONLY)
ALT: 15 U/L (ref 0–44)
AST: 17 U/L (ref 15–41)
Albumin: 3.3 g/dL — ABNORMAL LOW (ref 3.5–5.0)
Alkaline Phosphatase: 79 U/L (ref 38–126)
Anion gap: 10 (ref 5–15)
BUN: 11 mg/dL (ref 8–23)
CO2: 26 mmol/L (ref 22–32)
Calcium: 9.5 mg/dL (ref 8.9–10.3)
Chloride: 105 mmol/L (ref 98–111)
Creatinine: 0.69 mg/dL (ref 0.44–1.00)
GFR, Est AFR Am: >60 mL/min (ref >60)
GFR, Estimated: >60 mL/min (ref >60)
Glucose, Bld: 92 mg/dL (ref 70–99)
Potassium: 3.3 mmol/L — ABNORMAL LOW (ref 3.5–5.1)
Sodium: 141 mmol/L (ref 135–145)
Total Bilirubin: 0.3 mg/dL (ref 0.3–1.2)
Total Protein: 6.8 g/dL (ref 6.5–8.1)

## 2020-06-06 LAB — CEA (IN HOUSE-CHCC): CEA (CHCC-In House): 2.78 ng/mL (ref 0.00–5.00)

## 2020-06-06 LAB — FERRITIN: Ferritin: 123 ng/mL (ref 11–307)

## 2020-06-10 ENCOUNTER — Telehealth: Payer: Self-pay | Admitting: Hematology

## 2020-06-10 DIAGNOSIS — E43 Unspecified severe protein-calorie malnutrition: Secondary | ICD-10-CM | POA: Diagnosis not present

## 2020-06-10 DIAGNOSIS — R69 Illness, unspecified: Secondary | ICD-10-CM | POA: Diagnosis not present

## 2020-06-10 DIAGNOSIS — C20 Malignant neoplasm of rectum: Secondary | ICD-10-CM | POA: Diagnosis not present

## 2020-06-10 DIAGNOSIS — I82621 Acute embolism and thrombosis of deep veins of right upper extremity: Secondary | ICD-10-CM | POA: Diagnosis not present

## 2020-06-10 DIAGNOSIS — I10 Essential (primary) hypertension: Secondary | ICD-10-CM | POA: Diagnosis not present

## 2020-06-10 DIAGNOSIS — Z433 Encounter for attention to colostomy: Secondary | ICD-10-CM | POA: Diagnosis not present

## 2020-06-10 DIAGNOSIS — Z48815 Encounter for surgical aftercare following surgery on the digestive system: Secondary | ICD-10-CM | POA: Diagnosis not present

## 2020-06-10 DIAGNOSIS — R1310 Dysphagia, unspecified: Secondary | ICD-10-CM | POA: Diagnosis not present

## 2020-06-10 DIAGNOSIS — J449 Chronic obstructive pulmonary disease, unspecified: Secondary | ICD-10-CM | POA: Diagnosis not present

## 2020-06-11 ENCOUNTER — Inpatient Hospital Stay: Payer: Medicare HMO | Admitting: Nutrition

## 2020-06-11 ENCOUNTER — Other Ambulatory Visit: Payer: Self-pay

## 2020-06-11 ENCOUNTER — Telehealth: Payer: Self-pay

## 2020-06-11 DIAGNOSIS — J449 Chronic obstructive pulmonary disease, unspecified: Secondary | ICD-10-CM | POA: Diagnosis not present

## 2020-06-11 DIAGNOSIS — E43 Unspecified severe protein-calorie malnutrition: Secondary | ICD-10-CM | POA: Diagnosis not present

## 2020-06-11 DIAGNOSIS — C20 Malignant neoplasm of rectum: Secondary | ICD-10-CM | POA: Diagnosis not present

## 2020-06-11 DIAGNOSIS — R1310 Dysphagia, unspecified: Secondary | ICD-10-CM | POA: Diagnosis not present

## 2020-06-11 DIAGNOSIS — Z433 Encounter for attention to colostomy: Secondary | ICD-10-CM | POA: Diagnosis not present

## 2020-06-11 DIAGNOSIS — I10 Essential (primary) hypertension: Secondary | ICD-10-CM | POA: Diagnosis not present

## 2020-06-11 DIAGNOSIS — R69 Illness, unspecified: Secondary | ICD-10-CM | POA: Diagnosis not present

## 2020-06-11 DIAGNOSIS — Z48815 Encounter for surgical aftercare following surgery on the digestive system: Secondary | ICD-10-CM | POA: Diagnosis not present

## 2020-06-11 DIAGNOSIS — I82621 Acute embolism and thrombosis of deep veins of right upper extremity: Secondary | ICD-10-CM | POA: Diagnosis not present

## 2020-06-11 NOTE — Telephone Encounter (Signed)
Faxed signed documents and sent to scan

## 2020-06-11 NOTE — Telephone Encounter (Signed)
Completed and returned to [place] work station.

## 2020-06-11 NOTE — Progress Notes (Signed)
69 year old female diagnosed with rectal cancer in March 2021.  She is status post a bowel perforation and colostomy.  She was diagnosed with Moderate malnutrition in the hospital and her surgical wound is slow healing.  During hospitalization, she had progressive anasarca and generalized edema which is improved since discharge per patient.  She also received both tube feeding and parenteral nutrition which was then discontinued when she was discharged.  There is no plan for chemotherapy at this time.  She is being followed by Dr. Burr Medico.  Past medical history includes COPD, chronic asthma, and ARF.  Labs include potassium 3.3 and albumin 3.3 on June 24.  Medications include Xanax, Colace, Cymbalta, and Protonix.  Height: 5 feet 2 inches. Weight: 115.4 pounds on June 17. Usual body weight: 127-129 pounds. BMI: 21.11.  Patient denies nausea and vomiting however is having constipation.  Reports she is having a bowel movement about every other day.  She has added prunes to her diet to help with this. Reports appetite is improved.  She is eating 2 meals a day.  Breakfast consists of an egg sandwich with scrambled eggs, bread and mayonnaise or a bagel and dinner is typically a protein like salmon or chicken with vegetables.  She has oralnutritionsupplements but hasn't tried them yet.  She reports milk upsets her stomach but she tolerates other dairy foods.  Nutrition diagnosis: Unintended weight loss related to cancer and associated treatments as evidenced by 10% weight loss from usual body weight.  Intervention: Patient was educated to increase oral intake 5-6 small meals/snacks daily consisting of higher calorie, higher protein foods. Reviewed ways to add calories and protein to present meal pattern. Reviewed strategies for improving constipation including increasing fluids and eating at regular scheduled times. Recommended begin 4 ounces of oral nutrition supplements daily and work up as  tolerated. Suggested patient begin a general multivitamin, 500 mg of vitamin C twice daily and 220 mg of zinc sulfate daily.  Recommended discontinue zinc sulfate after 2 weeks. Questions were answered.  Teach back method used. I will mail fact sheets and contact information.  Monitoring, evaluation, goals: Patient will tolerate increased calories and protein to promote healing.  Next visit: To be scheduled as needed.  **Disclaimer: This note was dictated with voice recognition software. Similar sounding words can inadvertently be transcribed and this note may contain transcription errors which may not have been corrected upon publication of note.**

## 2020-06-11 NOTE — Telephone Encounter (Signed)
Orders recieved from [facility] and placed on Dr Dierdre Highman desk to review.

## 2020-06-13 DIAGNOSIS — R1310 Dysphagia, unspecified: Secondary | ICD-10-CM | POA: Diagnosis not present

## 2020-06-13 DIAGNOSIS — E43 Unspecified severe protein-calorie malnutrition: Secondary | ICD-10-CM | POA: Diagnosis not present

## 2020-06-13 DIAGNOSIS — R69 Illness, unspecified: Secondary | ICD-10-CM | POA: Diagnosis not present

## 2020-06-13 DIAGNOSIS — C20 Malignant neoplasm of rectum: Secondary | ICD-10-CM | POA: Diagnosis not present

## 2020-06-13 DIAGNOSIS — J449 Chronic obstructive pulmonary disease, unspecified: Secondary | ICD-10-CM | POA: Diagnosis not present

## 2020-06-13 DIAGNOSIS — Z48815 Encounter for surgical aftercare following surgery on the digestive system: Secondary | ICD-10-CM | POA: Diagnosis not present

## 2020-06-13 DIAGNOSIS — I82621 Acute embolism and thrombosis of deep veins of right upper extremity: Secondary | ICD-10-CM | POA: Diagnosis not present

## 2020-06-13 DIAGNOSIS — I10 Essential (primary) hypertension: Secondary | ICD-10-CM | POA: Diagnosis not present

## 2020-06-13 DIAGNOSIS — Z433 Encounter for attention to colostomy: Secondary | ICD-10-CM | POA: Diagnosis not present

## 2020-06-19 DIAGNOSIS — I82621 Acute embolism and thrombosis of deep veins of right upper extremity: Secondary | ICD-10-CM | POA: Diagnosis not present

## 2020-06-19 DIAGNOSIS — R1310 Dysphagia, unspecified: Secondary | ICD-10-CM | POA: Diagnosis not present

## 2020-06-19 DIAGNOSIS — J9600 Acute respiratory failure, unspecified whether with hypoxia or hypercapnia: Secondary | ICD-10-CM | POA: Diagnosis not present

## 2020-06-19 DIAGNOSIS — R69 Illness, unspecified: Secondary | ICD-10-CM | POA: Diagnosis not present

## 2020-06-19 DIAGNOSIS — J90 Pleural effusion, not elsewhere classified: Secondary | ICD-10-CM | POA: Diagnosis not present

## 2020-06-19 DIAGNOSIS — J449 Chronic obstructive pulmonary disease, unspecified: Secondary | ICD-10-CM | POA: Diagnosis not present

## 2020-06-19 DIAGNOSIS — C20 Malignant neoplasm of rectum: Secondary | ICD-10-CM | POA: Diagnosis not present

## 2020-06-19 DIAGNOSIS — M6281 Muscle weakness (generalized): Secondary | ICD-10-CM | POA: Diagnosis not present

## 2020-06-19 DIAGNOSIS — E43 Unspecified severe protein-calorie malnutrition: Secondary | ICD-10-CM | POA: Diagnosis not present

## 2020-06-20 DIAGNOSIS — T8189XA Other complications of procedures, not elsewhere classified, initial encounter: Secondary | ICD-10-CM | POA: Diagnosis not present

## 2020-06-21 ENCOUNTER — Telehealth: Payer: Self-pay | Admitting: Adult Health

## 2020-06-21 MED ORDER — DOXYCYCLINE HYCLATE 100 MG PO TABS: 100.0000 mg | ORAL_TABLET | Freq: Two times a day (BID) | ORAL | 0 refills | Status: DC

## 2020-06-21 NOTE — Telephone Encounter (Signed)
Called and spoke with pt who stated 3 days ago she began to have complaints of clear nasal drainage, sore throat, cough with clear phlegm. Pt does have some sinus pressure and has some pain in face.  Pt does have some complaints of wheezing. Pt denies any current complaints of chest discomfort.  Pt is unsure if she has a temp as she does not have a thermometer but she states that she does feel like she might be running one.  Pt has tried flonase, allergy meds, as well as mucinex to help with symptoms.  Pt is using Trelegy daily as prescribed. Pt has had to use nebulizer 2-3 times a day. Pt states when she uses her nebulizer, it does help open up her bronchial tubes. Pt states that she has had some relief from the OTC meds.  Pt is requesting to have something prescribed to help with her symptoms as she does not want this to fully get into her chest with the weekend coming up.  Beth, please advise.

## 2020-06-21 NOTE — Telephone Encounter (Signed)
I will send in prescription for doxycycline, I would hold off on taking unless symptoms worsen.  Recommend saline/ocean nasal spray twice daily prior to flonase.

## 2020-06-21 NOTE — Telephone Encounter (Signed)
Called and spoke with pt letting her know the info stated by Beth and she verbalized understanding. Nothing further needed. 

## 2020-06-22 DIAGNOSIS — C20 Malignant neoplasm of rectum: Secondary | ICD-10-CM | POA: Diagnosis not present

## 2020-06-24 ENCOUNTER — Ambulatory Visit: Payer: Medicare HMO | Admitting: Family Medicine

## 2020-06-25 ENCOUNTER — Telehealth: Payer: Self-pay

## 2020-06-25 NOTE — Telephone Encounter (Signed)
Called patient and made her aware of Dr. Ernestina Penna message regarding lab results. Patient verbalized understanding.

## 2020-06-25 NOTE — Telephone Encounter (Signed)
-----   Message from [name], MD sent at 06/22/2020 12:22 PM EDT ----- Please let pt know her lab results, all WNL except K slightly low, encourage high K diet. Thanks   [name]  06/22/2020

## 2020-06-27 ENCOUNTER — Ambulatory Visit (INDEPENDENT_AMBULATORY_CARE_PROVIDER_SITE_OTHER): Payer: Medicare HMO | Admitting: Adult Health

## 2020-06-27 ENCOUNTER — Encounter: Payer: Self-pay | Admitting: Adult Health

## 2020-06-27 ENCOUNTER — Other Ambulatory Visit: Payer: Self-pay

## 2020-06-27 DIAGNOSIS — J449 Chronic obstructive pulmonary disease, unspecified: Secondary | ICD-10-CM | POA: Diagnosis not present

## 2020-06-27 DIAGNOSIS — C20 Malignant neoplasm of rectum: Secondary | ICD-10-CM

## 2020-06-27 MED ORDER — DOXYCYCLINE HYCLATE 100 MG PO TABS
100.0000 mg | ORAL_TABLET | Freq: Two times a day (BID) | ORAL | 0 refills | Status: DC
Start: 2020-06-27 — End: 2020-07-02

## 2020-06-27 NOTE — Patient Instructions (Addendum)
Extend Doxycyline 100mg  Twice daily for additional 3 days .  Robitussin DM As needed  Cough .  Chest xray.  Follow up with in 3 weeks and As needed   Follow up Dr. Valeta Harms in 2 months and As needed    Please contact office for sooner follow up if symptoms do not improve or worsen or seek emergency care

## 2020-06-27 NOTE — Progress Notes (Signed)
PCCM:  Thanks for seeing them   [name], DO [facility] 06/27/2020 11:00 AM

## 2020-06-27 NOTE — Progress Notes (Signed)
Virtual Visit via Telephone Note  I connected with Tracy [name] on 06/27/20 at 10:30 AM EDT by telephone and verified that I am speaking with the correct person using two identifiers.  Location: Patient: Home  Provider: Office    I discussed the limitations, risks, security and privacy concerns of performing an evaluation and management service by telephone and the availability of in person appointments. I also discussed with the patient that there may be a patient responsible charge related to this service. The patient expressed understanding and agreed to proceed.   History of Present Illness: 69 year old female former smoker followed for very severe COPD Medical history significant for colon/rectal cancer (invasive , metastatic)   Today's televisit is a 1 month follow-up.  Patient was seen last visit for a post hospitalization visit for recent critical illness (02/2020) where she had prolonged hospitalization and an LTAC stay after a perforated colon due to colon and rectal cancer.  She did have surgery with resection and colostomy.  Her hospital stay was complicated by acute respiratory failure pneumonia with sepsis , required ventilatory support , subsequent  tracheostomy and eventual decannulation.  She also had a right upper extremity DVT felt secondary to PICC line.  And is on anticoagulation.  Also had bilateral pleural effusions requiring thoracentesis.   Was seen by oncology last month, notes reviewed.  Patient has stage III colon cancer with bowel [place] she is a high risk for cancer recurrence about 50%.  Was felt that adjunctive chemotherapy would reduce her risk of recurrence however due to her complicated postop course and long recovery that she was not a candidate.  She has been recommended for cancer surveillance.  She has a planned CT scan September 2021.   Patient says since last visit she was doing well up until a week ago when she started developing cold-like symptoms with  cough congestion discolored mucus and increased general fatigue.  Her husband also had similar symptoms.  Both of had her Covid vaccines full courses.  She denies any hemoptysis, fever, loss of taste or smell.  No nausea vomiting or diarrhea.  Says her appetite is fair.  She had been doing physical therapy but has not been able to do this over the last week. She was called in doxycycline last week she says she is starting to feel better.  Today is the best she has felt.  But she has been very weak and has been in the bed mostly. She has 2 additional days of doxycycline left.  Patient Active Problem List   Diagnosis Date Noted  . Pneumonia 05/29/2020  . Pleural effusion 05/29/2020  . 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 Essential hypertension 05/23/2020  . 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 Rectal cancer ([place]) 05/23/2020  . COPD, severe ([place])   . Acute deep vein thrombosis (DVT) of brachial vein of right upper extremity ([place])   . HIP PAIN, LEFT 01/06/2011    Current Outpatient Medications on File Prior to Visit  Medication Sig Dispense Refill  . ALPRAZolam (XANAX) 0.25 MG tablet Take 1 tablet (0.25 mg total) by mouth 2 (two) times daily as needed for anxiety. 40 tablet 0  . amLODipine (NORVASC) 10 MG tablet Take 1 tablet (10 mg total) by mouth daily. 90 tablet 1  . apixaban (ELIQUIS) 5 MG TABS tablet Take 1 tablet (5 mg total) by mouth 2 (two) times daily. 180 tablet 1  . docusate (COLACE) 50 MG/5ML liquid Place 10 mLs (100  mg total) into feeding tube 2 (two) times daily. 100 mL 0  . doxycycline (VIBRA-TABS) 100 MG tablet Take 1 tablet (100 mg total) by mouth 2 (two) times daily. 14 tablet 0  . DULoxetine (CYMBALTA) 30 MG capsule Take 1 capsule (30 mg total) by mouth daily. 90 capsule 1  . fexofenadine (ALLEGRA) 180 MG tablet Take 1 tablet (180 mg total) by mouth daily. 30 tablet 5  . fluticasone (FLONASE) 50 MCG/ACT nasal spray Place 2 sprays into both nostrils daily. 16  mL 5  . Fluticasone-Umeclidin-Vilant (TRELEGY ELLIPTA) 100-62.5-25 MCG/INH AEPB Inhale 1 puff into the lungs daily.    Marland Kitchen ipratropium-albuterol (DUONEB) 0.5-2.5 (3) MG/3ML SOLN Take 3 mLs by nebulization every 4 (four) hours as needed. 360 mL   . pantoprazole (PROTONIX) 40 MG tablet Take 1 tablet (40 mg total) by mouth daily. 90 tablet 3   No current facility-administered medications on file prior to visit.     Observations/Objective: Speaks in full sentences.  Assessment and Plan: Acute COPD exacerbation  Check chest x-ray extend doxycycline for additional 3 days.  This will complete a 10-day course.  She has been unable to take Mucinex.  Advised her to use Robitussin-DM over-the-counter.  Increase fluid intake.  And recommend high-protein diet.  She may resume physical therapy when she is feeling better.  Plan  Patient Instructions  Extend Doxycyline 100mg  Twice daily for additional 3 days .  Robitussin DM As needed  Cough .  Chest xray.  Follow up with in 3 weeks and As needed   Follow up Dr. Valeta Harms in 2 months and As needed    Please contact office for sooner follow up if symptoms do not improve or worsen or seek emergency care        Follow Up Instructions:    I discussed the assessment and treatment plan with the patient. The patient was provided an opportunity to ask questions and all were answered. The patient agreed with the plan and demonstrated an understanding of the instructions.   The patient was advised to call back or seek an in-person evaluation if the symptoms worsen or if the condition fails to improve as anticipated.  I provided 25 minutes of non-face-to-face time during this encounter.   Rexene Edison, NP

## 2020-06-28 ENCOUNTER — Ambulatory Visit (INDEPENDENT_AMBULATORY_CARE_PROVIDER_SITE_OTHER)
Admission: RE | Admit: 2020-06-28 | Discharge: 2020-06-28 | Disposition: A | Discharge status: Home / Self Care | Payer: Medicare HMO | Source: Ambulatory Visit | Attending: Adult Health | Admitting: Adult Health

## 2020-06-28 ENCOUNTER — Other Ambulatory Visit: Payer: Self-pay

## 2020-06-28 ENCOUNTER — Telehealth: Payer: Self-pay

## 2020-06-28 DIAGNOSIS — J90 Pleural effusion, not elsewhere classified: Secondary | ICD-10-CM | POA: Diagnosis not present

## 2020-06-28 DIAGNOSIS — R69 Illness, unspecified: Secondary | ICD-10-CM | POA: Diagnosis not present

## 2020-06-28 DIAGNOSIS — J449 Chronic obstructive pulmonary disease, unspecified: Secondary | ICD-10-CM

## 2020-06-28 DIAGNOSIS — J439 Emphysema, unspecified: Secondary | ICD-10-CM | POA: Diagnosis not present

## 2020-06-28 DIAGNOSIS — R0602 Shortness of breath: Secondary | ICD-10-CM | POA: Diagnosis not present

## 2020-06-28 NOTE — Telephone Encounter (Signed)
Faxed and sent to scan.

## 2020-06-28 NOTE — Telephone Encounter (Signed)
Completed and returned to nursing station.

## 2020-06-28 NOTE — Telephone Encounter (Signed)
[employer] faxed order for wound supplies  Wound cleanser, 1 unit, for 30 days.   Placed on Dr Dierdre Highman desk to review and sign

## 2020-07-02 ENCOUNTER — Ambulatory Visit (INDEPENDENT_AMBULATORY_CARE_PROVIDER_SITE_OTHER): Payer: Medicare HMO | Admitting: Family Medicine

## 2020-07-02 ENCOUNTER — Encounter: Payer: Self-pay | Admitting: Family Medicine

## 2020-07-02 ENCOUNTER — Telehealth: Payer: Self-pay | Admitting: *Deleted

## 2020-07-02 ENCOUNTER — Other Ambulatory Visit: Payer: Self-pay

## 2020-07-02 VITALS — BP 145/83 | HR 89 | Temp 98.7°F | Ht 62.0 in | Wt 113.4 lb

## 2020-07-02 DIAGNOSIS — R69 Illness, unspecified: Secondary | ICD-10-CM | POA: Diagnosis not present

## 2020-07-02 DIAGNOSIS — J441 Chronic obstructive pulmonary disease with (acute) exacerbation: Secondary | ICD-10-CM

## 2020-07-02 DIAGNOSIS — J449 Chronic obstructive pulmonary disease, unspecified: Secondary | ICD-10-CM | POA: Diagnosis not present

## 2020-07-02 DIAGNOSIS — I10 Essential (primary) hypertension: Secondary | ICD-10-CM

## 2020-07-02 DIAGNOSIS — Z933 Colostomy status: Secondary | ICD-10-CM | POA: Diagnosis not present

## 2020-07-02 DIAGNOSIS — I82621 Acute embolism and thrombosis of deep veins of right upper extremity: Secondary | ICD-10-CM

## 2020-07-02 DIAGNOSIS — C20 Malignant neoplasm of rectum: Secondary | ICD-10-CM | POA: Diagnosis not present

## 2020-07-02 DIAGNOSIS — F064 Anxiety disorder due to known physiological condition: Secondary | ICD-10-CM

## 2020-07-02 MED ORDER — AZITHROMYCIN 500 MG PO TABS
500.0000 mg | ORAL_TABLET | Freq: Every day | ORAL | 0 refills | Status: DC
Start: 2020-07-02 — End: 2020-10-08

## 2020-07-02 MED ORDER — METHYLPREDNISOLONE ACETATE 80 MG/ML IJ SUSP
80.0000 mg | Freq: Once | INTRAMUSCULAR | Status: AC
  Administered 2020-07-02: 80 mg via INTRAMUSCULAR

## 2020-07-02 MED ORDER — PREDNISONE 50 MG PO TABS
50.0000 mg | ORAL_TABLET | Freq: Every day | ORAL | 0 refills | Status: DC
Start: 2020-07-02 — End: 2020-10-08

## 2020-07-02 NOTE — Progress Notes (Signed)
Patient ID: Tracy [name], female  DOB: 1951-01-26, 69 y.o.   MRN: [id number] Patient Care Team    Relationship Specialty Notifications Start End  [name], DO PCP - General Family Medicine  05/21/20   Jonnie Finner, RN Oncology Nurse Navigator  All results, Admissions 05/07/20    Comment: Phone:  [phone]  Fax:  [phone]  [name], [place] Physician Hematology All results, Admissions 05/07/20   [name], DO Consulting Physician Pulmonary Disease  05/27/20   [facility]    05/27/20   [name], MD Consulting Physician General Surgery  2/54/27   [name], MD Consulting Physician General Surgery  05/27/20     Chief Complaint  Patient presents with  . Hypertension  . Anxiety  . COPD    followed by pulm   . Medication Management    would like to make sure she needs to be on all meds.     Subjective: Tracy [name] is a 69 y.o.  female present for [facility] follow up COPD, severe ([place]) Patient under the care of pulmonology.    Acute deep vein thrombosis (DVT) of brachial vein of right upper extremity [facility]) Experienced a DVT at the site of her PICC line.  She has not taken her Eliquis for 2 to 3 days secondary to cost.  She has not discussed this with her oncologist recently.  Oncology's last note reflects continuation of Eliquis.  Essential hypertension Hypertension diagnosis initiated while in the hospital for rectal cancer.  She had been on amlodipine 10 mg and lisinopril 5 mg daily.  Lisinopril 5 mg daily was discontinued at hospital follow-up secondary to low end blood pressures.  Patient reports she stopped taking her amlodipine recently as well secondary to having swelling in her legs.  She reports the swelling has improved in her legs.  Colostomy status (HCC)/Rectal cancer [facility]) Patient is followed closely by her oncology team.  Anxiety disorder due to medical condition Patient was started on Cymbalta and Xanax  during her hospitalization.  Patient had desired to discontinue the Xanax and continue the Cymbalta when we last spoke.  Today she states she has discontinued the Cymbalta and has continued the Xanax.  Xanax is being managed by her oncology team.  She reports her anxiety is much better controlled now that she is out of the hospital and settled.  She uses the Xanax just a few times a week and feels she does not need a daily medication now.  COPD exacerbation: Patient was seen in 06/27/2020 at her pulmonology office for COPD with exacerbation.  He had been prescribed doxycycline prior for this exacerbation and it was extended to a full 10-day course.  She reports today she is still having some cough, wheezing and respiratory symptoms.  She denies fever, chills, nausea or vomit.  She is still mildly fatigued.  Pulmonology is managing and her inhalers.  Chest x-ray was normal.  Depression screen [facility] 2/9 07/02/2020  Decreased Interest 0  Down, Depressed, Hopeless 0  PHQ - 2 Score 0  Altered sleeping 0  Tired, decreased energy 0  Change in appetite 0  Feeling bad or failure about yourself  0  Trouble concentrating 0  Moving slowly or fidgety/restless 0  Suicidal thoughts 0  PHQ-9 Score 0  Difficult doing work/chores Not difficult at all   No flowsheet data found.     Fall Risk  07/02/2020  Falls in the past year? 0  Number falls in past yr: 0  Injury with Fall? 0     Immunization History  Administered Date(s) Administered  . Fluad Quad(high Dose 65+) 10/27/2019  . Influenza Split 01/06/2013, 09/13/2013  . Influenza, High Dose Seasonal PF 10/10/2018  . Influenza,inj,Quad PF,6+ Mos 09/13/2014, 10/15/2015  . Influenza,inj,Quad PF,6-35 Mos 10/26/2016  . Moderna SARS-COVID-2 Vaccination 01/09/2020, 02/06/2020  . Pneumococcal Conjugate-13 07/12/2014  . Pneumococcal Polysaccharide-23 01/06/2013, 10/10/2018  . Tdap 05/15/2013  . Zoster 07/14/2013    No exam data present  Past Medical  History:  Diagnosis Date  . Acute deep vein thrombosis (DVT) of brachial vein of right upper extremity ([place])   . Acute on chronic respiratory failure with hypoxia ([place])   . Acute respiratory failure with hypoxemia ([place])   . Asthma   . CH[place], HX OF 01/06/2011   Qualifier: Diagnosis of  By: Charlett Blake MD, Erline Levine    . COPD (chronic obstructive pulmonary disease) ([place]) 2011   FeV1 31% predicted FeV1/FVX 47 %  . COPD, severe ([place])   . Essential hypertension 05/23/2020  . Hemorrhoid   . Open right radial fracture 2020  . Osteopenia   . Perforated sigmoid colon ([place]) 03/03/2020  . Pleural effusion   . Pneumonia 2021  . Postoperative intra-abdominal abscess 2021  . Seasonal allergies    takes Claritin daily prn  . Shock circulatory ([place])   . Tracheostomy status ([place])   . Vitamin D deficiency    takes Vit d every 14 days   Allergies  Allergen Reactions  . Amlodipine     Leg swelling.   . Codeine Swelling   Past Surgical History:  Procedure Laterality Date  . AUGMENTATION MAMMAPLASTY     saline  . EXAMINATION UNDER ANESTHESIA  10/14/2012   Procedure: EXAM UNDER ANESTHESIA;  Surgeon: Gayland Curry, MD,FACS;  Location: [place];  Service: General;  Laterality: N/A;  rectal exam under anesthesia excisional hemorrhoidectomy hemorrhoidal banding x two  . EXAMINATION UNDER ANESTHESIA  02/03/2013   excision hemorrhoidal tissue  . FOOT SURGERY     left bunionectomy  . HEMORRHOIDECTOMY WITH HEMORRHOID BANDING  10/14/2012   Procedure: HEMORRHOIDECTOMY WITH HEMORRHOID BANDING;  Surgeon: Gayland Curry, MD,FACS;  Location: [place];  Service: General;  Laterality: N/A;  . IR THORACENTESIS ASP PLEURAL SPACE W/IMG GUIDE  04/23/2020  . LAPAROTOMY N/A 03/03/2020   Procedure: low anterior resection end colostomy;  Surgeon: [name], MD;  Location: [facility];  Service: General;  Laterality: N/A;  . LAPAROTOMY N/A 03/12/2020   Procedure: EXPLORATORY LAPAROTOMY WITH BOWEL RESECTION AND COLOSTOMY;  Surgeon:  [name], MD;  Location: [facility];  Service: General;  Laterality: N/A;  . OPEN REDUCTION INTERNAL FIXATION (ORIF) DISTAL RADIAL FRACTURE Right 11/01/2018   Procedure: OPEN REDUCTION INTERNAL FIXATION (ORIF) DISTAL RADIAL FRACTURE;  Surgeon: [name], MD;  Location: [place];  Service: Orthopedics;  Laterality: Right;  . TONSILLECTOMY  1960   recurrent otitis media  . US ECHOCARDIOGRAPHY  02/2020    poor windows, normal LV function, severely dilated RV with moderately reduced function, RV volume and pressure overload, mildly dilated RA   Family History  Problem Relation Age of Onset  . Osteoporosis Mother   . Other Father        [place] in snow, broke ankle, blood clot to lungs  . Heart murmur Sister   . Cancer Sister        leukemia   . Other Brother  h/o being hit by lightening  . Parkinsonism Brother   . Asthma Son   . Allergies Son   . Otitis media Son   . Other Sister        CML  . Asthma Son   . Allergies Son   . Pancreatic cancer Brother 49       diag 12-16-13-deceased 2014/02/15   Social History   Social History Narrative   Marital status/children/pets: Married.  2 children.   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   Safety:      -smoke alarm in the home:Yes     - wears seatbelt: Yes     - Feels safe in their relationships: Yes    All past medical history, surgical history, allergies, family history, immunizations andmedications were updated in the EMR today and reviewed under the history and medication portions of their EMR.      ROS: 14 pt review of systems performed and negative (unless mentioned in an HPI)  Objective: BP (!) 145/83   Pulse 89   Temp 98.7 F (37.1 C) (Temporal)   Ht 5\' 2"  (1.575 m)   Wt 113 lb 6.4 oz (51.4 kg)   LMP 12/14/2000   SpO2 (!) 89%   BMI 20.74 kg/m   Gen: Afebrile. No acute distress.  HENT: AT. [place].  Eyes:Pupils Equal Round Reactive to light, Extraocular movements intact,  Conjunctiva  without redness, discharge or icterus. CV: RRR no murmur, no edema, +2/4 P posterior tibialis pulses Chest: Mild wheezing present, otherwise clear to auscultation bilaterally.  Decreased lung sounds bilaterally. Neuro:  Normal gait. PERLA. EOMi. Alert. Oriented x3 . Psych: Normal affect, dress and demeanor. Normal speech. Normal thought content and judgment.   Assessment/plan: Tracy [name] is a 69 y.o. female present for  COPD, severe (HCC)/COPD exacerbation Patient still having symptoms of COPD exacerbation.  Very mild wheezing on exam today and diminished lung sounds otherwise. Start Zithromax and 500 mg x 3 days.  IM Depo-Medrol injection provided today. Start prednisone 50 mg x 4 days tomorrow. Inhalers per pulmonology team notes. Continue routine follow-ups with Dr. Valeta Harms  Acute deep vein thrombosis (DVT) of brachial vein of right upper extremity ([place]) Strongly encourage patient to call her heme/onc team today for advice on Eliquis.  Per the last note, she wanted her to continue the Eliquis secondary to decreased activity and cancer diagnosis.  Unfortunately, cost is a factor for them.  Patient is now more active.  Essential hypertension Resolved. Blood pressure normal today without patient being on medications for some time. Encouraged them to continue monitoring and if greater than 135/85 routinely, then I would encourage them to call and make an appointment for recheck.  Would avoid amlodipine use secondary to swelling if needing to add back another agent.  Colostomy status (HCC)/Rectal cancer [facility]) Continue routine follow-ups with her surgical team and gastroenterologist.  Anxiety disorder due to medical condition Patient seems to be adjusting to her diagnoses and condition. She appears well today and overall is much improved.  Agree she should be okay without a daily medication like Cymbalta-now that she is adjusted. We did discussed Xanax use today.  She is aware this can  be an addictive medication.  She is using infrequently as needed.  She is taking medication 2 -3x a week (sometimes less). Xanax prescription is being managed by her oncology team.    Return for yearly  for physcial.  Sooner if needed.   Patient will need to be seen  every 3 months if home health orders need to be continued.   No orders of the defined types were placed in this encounter.  Meds ordered this encounter  Medications  . azithromycin (ZITHROMAX) 500 MG tablet    Sig: Take 1 tablet (500 mg total) by mouth daily.    Dispense:  3 tablet    Refill:  0  . predniSONE (DELTASONE) 50 MG tablet    Sig: Take 1 tablet (50 mg total) by mouth daily with breakfast.    Dispense:  4 tablet    Refill:  0  . methylPREDNISolone acetate (DEPO-MEDROL) injection 80 mg   Referral Orders  No referral(s) requested today    Note is dictated utilizing voice recognition software. Although note has been proof read prior to signing, occasional typographical errors still can be missed. If any questions arise, please do not hesitate to call for verification.  Electronically signed by: Howard Pouch, DO [place]

## 2020-07-02 NOTE — Telephone Encounter (Signed)
Husband states Tracy [name] is due for a refill of eliquis. She is almost 100% back to normal activities. Wants to know if she needs to continue eliquis.

## 2020-07-02 NOTE — Telephone Encounter (Signed)
I think she has been on Eliquis for 3 months now, please confirm with pt. If she is back to her normal activities, OK to stop Eliquis.   [name] MD

## 2020-07-02 NOTE — Patient Instructions (Signed)
Start azithromycin once daily for 3 days.  Start prednisone 1 tab daily with food (starting tomorrow).  Monitor BP and if routinely over 135/85> then follow up. If it stays below you do not need BP medication.   Call oncology about eliquis. They can guide on continuing medication.

## 2020-07-03 DIAGNOSIS — J449 Chronic obstructive pulmonary disease, unspecified: Secondary | ICD-10-CM | POA: Diagnosis not present

## 2020-07-03 DIAGNOSIS — R69 Illness, unspecified: Secondary | ICD-10-CM | POA: Diagnosis not present

## 2020-07-03 DIAGNOSIS — C20 Malignant neoplasm of rectum: Secondary | ICD-10-CM | POA: Diagnosis not present

## 2020-07-03 DIAGNOSIS — Z48815 Encounter for surgical aftercare following surgery on the digestive system: Secondary | ICD-10-CM | POA: Diagnosis not present

## 2020-07-03 DIAGNOSIS — Z433 Encounter for attention to colostomy: Secondary | ICD-10-CM | POA: Diagnosis not present

## 2020-07-03 DIAGNOSIS — E43 Unspecified severe protein-calorie malnutrition: Secondary | ICD-10-CM | POA: Diagnosis not present

## 2020-07-03 DIAGNOSIS — I82621 Acute embolism and thrombosis of deep veins of right upper extremity: Secondary | ICD-10-CM | POA: Diagnosis not present

## 2020-07-03 DIAGNOSIS — R1310 Dysphagia, unspecified: Secondary | ICD-10-CM | POA: Diagnosis not present

## 2020-07-03 DIAGNOSIS — I10 Essential (primary) hypertension: Secondary | ICD-10-CM | POA: Diagnosis not present

## 2020-07-03 NOTE — Telephone Encounter (Signed)
Called and spoke with [name] and notified him that Dr. Burr Medico got the message and wants to know if she has been on the elquis for 3 months now? [name] confirmed that Tracy [name] has been and she is back to her normal activities so they have decided to discontinue the elquis at this time.

## 2020-07-04 ENCOUNTER — Encounter: Payer: Self-pay | Admitting: Family Medicine

## 2020-07-05 ENCOUNTER — Telehealth: Payer: Self-pay

## 2020-07-05 NOTE — Telephone Encounter (Signed)
On 7/20 spoke with Tracy [name] and informed him that per Dr. Burr Medico if Tracy [name] has resumed to activities 100% she is okay with holding the elquis for now. Tracy [name] verbalized understanding and confirmed that Tracy [name] returned back to normal activities and will hold the elquis.

## 2020-07-08 NOTE — Progress Notes (Signed)
[place]   Telephone:([phone]) [phone] [phone]) [phone]   Clinic Follow up Note   Patient Care Team: [name], DO as PCP - General (Family Medicine) Jonnie Finner, RN as Oncology Nurse Navigator [name], MD as Consulting Physician (Hematology) Icard, Octavio Graves, DO as Consulting Physician (Pulmonary Disease) McKinnon, P.A. [name], MD as Consulting Physician (General Surgery) [name], MD as Consulting Physician (General Surgery)  Date of Service:  07/12/2020  CHIEF COMPLAINT: F/u of rectal cancer   SUMMARY OF ONCOLOGIC HISTORY: Oncology History  Rectal cancer [facility])  03/03/2020 - 04/05/2020 Hospital Admission   She was admitted to ED on 03/03/20 for abdominal pain and nausea. She had been having diarrhea for several weeks. During hospital stay she developed acute respiratory failure, AKI, RUE DVT from PICC line. Work up showed bowel perforation, small left liver mass and thickening of jejunum. She underwent emergent surgery on 03/03/20 for resection and colostomy placement. Her path showed invasive cancer, metastatic to 3/15 LNs. She had a NGT in placed but this was removed on POD 3. Post op her stoma became necrotic and septic. She had another bowel surgery on 03/12/20, which was NED with necrotic tissue.    03/03/2020 Imaging   CT AP 03/03/20  IMPRESSION: 1. Free intraperitoneal air consistent perforation. Most likely site of perforation is in the distal splenic flexure/proximal descending colon where there is a collection of air and gas measuring 6 centimeters. No obvious soft tissue mass identified in this region. At this site, there is abrupt transition of dilated, stool-filled colon to completely decompressed proximal descending colon. 2. Thickened, inflamed loops of jejunum are identified within the pelvis and are likely reactive. 3. Small hiatal hernia. 4. Benign-appearing 1.6 centimeter mass the LEFT hepatic  lobe. Recommend comparison with prior studies if available. 5.  Emphysema (ICD10-J43.9). 6.  Aortic Atherosclerosis (ICD10-I70.0). 7. Bilateral renal scarring.   03/03/2020 Surgery   low anterior resection end colostomy by Dr Marcello Moores    03/03/2020 Initial Biopsy   FINAL MICROSCOPIC DIAGNOSIS: 03/03/20 A. RECTOSIGMOID COLON, LOW ANTERIOR RESECTION:  - Invasive colonic adenocarcinoma, 3.5 cm.  - Tumor invades the visceral peritoneum.  - Margins of resection are not involved.  - Metastatic carcinoma in (3) of (15) lymph nodes.  - See oncology table.   B. ADDITIONAL SIGMOID COLON, RESECTION:  - Colonic tissue, negative for carcinoma.  ADDENDUM:  Mismatch Repair Protein (IHC)   SUMMARY INTERPRETATION: NORMAL  There is preserved expression of the major MMR proteins. There is a very  low probability that microsatellite instability (MSI) is present.  However, certain clinically significant MMR protein mutations may result  in preservation of nuclear expression. It is recommended that the  preservation of protein expression be correlated with molecular based  MSI testing.   IHC EXPRESSION RESULTS  TEST           RESULT  MLH1:          Preserved nuclear expression  MSH2:          Preserved nuclear expression  MSH6:          Preserved nuclear expression  PMS2:          Preserved nuclear expression   03/08/2020 Imaging   CT AP 03/08/20 IMPRESSION: 1. Interval midline laparotomy with distal colon resection and diverting left lower quadrant colostomy. 2. Diffuse small bowel dilatation with gas fluid levels most consistent with postoperative ileus. 3. Trace ascites within the abdomen and pelvis. No  fluid collection or abscess at this time. Surgical drain within the lower pelvis. 4. Indeterminate 1.4 cm subcapsular liver hypodensity. In light of newly diagnosed rectal cancer, metastatic disease cannot be excluded. PET CT may be useful for further evaluation. 5. Interval development of  trace bilateral pleural effusions and diffuse body wall edema.   03/12/2020 Surgery   EXPLORATORY LAPAROTOMY WITH BOWEL RESECTION AND COLOSTOMY by Dr Hassell Done 03/12/20  FINAL MICROSCOPIC DIAGNOSIS: 03/12/20  A. COLON, SPLENIC FLEXURE, RESECTION:  - Segment of colon (37 cm) with perforation and associated inflammation  - Multiple mucosal ulcers with necrotizing inflammation  - No evidence of malignancy    03/12/2020 Imaging   CT AP WO contrast 03/12/20  IMPRESSION: 1. Exam is limited by lack of intravenous contrast, motion, and artifact from the patient's arms adjacent to the torso. Since 03/08/2020, there has been development of relatively large pockets of intraperitoneal free air. While intraperitoneal gas would not be unexpected on postoperative day 9, this gas is new since an intervening study of 03/08/2020 and given the relatively large volume certainly raises concern for bowel perforation. No source for the intraperitoneal free air is evident on this study. There is a surgical drain in the pelvis, but there is no free gas around the drain itself to suggest that it represents the source. 2. New circumferential wall thickening in the splenic flexure, descending colon and sigmoid colon leading into the end colostomy. Infection/inflammation would be a consideration. Ischemia cannot be excluded. 3. Relatively small volume intraperitoneal free fluid. High attenuation small fluid collections in the left upper abdomen may reflect hemorrhage, infection or residua from prior perforation. 4. Interval progression of diffuse body wall edema. 5. Residual contrast material in the renal parenchyma from prior imaging, compatible with renal dysfunction.    03/19/2020 Imaging   CT CAP 03/19/20  IMPRESSION: 1. 5.5 x 3.2 cm fluid collection is noted along the greater curvature of the proximal stomach. 2. Surgical drain is again noted in the pelvis with tip in left lower quadrant. 3. Interval  development of crescent-shaped fluid collection measuring 16.4 x 3.3 cm in the epigastric region and left upper quadrant of the abdomen which may extend into the left lower quadrant. Potentially this may represent abscess or developing abscess. Multiple other smaller fluid collections are noted which may represent small abscesses. 4. 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 5. Mild amount of free fluid is noted in the posterior pelvis. 6. Moderate anasarca is noted. Aortic Atherosclerosis (ICD10-I70.0).   04/12/2020 Imaging   CT AP 04/12/20  IMPRESSION: 1. Fluid collections within the LEFT abdomen have significantly decreased compared to previous CT exams, now nearly completely resolved. 2. Percutaneous drainage catheter with tip coiled posterior to the LEFT kidney, stable positioning compared to the previous study. The more anterior catheter has been removed. 3. Small amount of free fluid persists within the abdomen and pelvis. 4. Trace bibasilar pleural effusions. 5. Anasarca. 6. While reviewing today's study, comparing with a chest/abdomen/pelvis CT from earlier same month, there is question of thrombus in the LEFT internal jugular vein. Recommend ultrasound of the LEFT IJ to exclude DVT. This recommendation discussed with patient's hospitalist, Dr. Owens Shark, on 04/12/2020 at 4:20 p.m.   Emphysema (ICD10-J43.9).   04/22/2020 Imaging   CT AP 04/22/20  IMPRESSION: 1. No recurrent intra-abdominal abscess status post interval removal of left retroperitoneal percutaneous drain. Stable small amount of free pelvic fluid. 2. Enlarging dependent bilateral pleural effusions, now moderate in volume. Associated atelectasis at both  lung bases. 3. Progressive anasarca with generalized edema throughout the subcutaneous fat. 4. Stable small subcapsular fluid collection along the anterior aspect of the left hepatic lobe. 5. Aortic Atherosclerosis (ICD10-I70.0).   05/23/2020 Initial  Diagnosis   Rectal cancer ([place])      CURRENT THERAPY:  Surveillance   INTERVAL HISTORY:  Tracy [name] is here for a follow up. She presents to the clinic with her husband. She was recently seen by Dr. Marcello Moores and referred back to discuss adjuvant chemo again.   She notes she is walking better.  She notes her surgical wound is closed and healing and better than before. She noes Dr Marcello Moores released her from her care. She notes she is eating adequately but not able to gain weight. Her weight is stable. She notes her normal weight is 118-120. She tries to manage her colostomy. She had accidents or leakage a few times. She notes she caught infection from her 15 years old granddaughter. She notes when she was sick with coughing it would bust her colostomy bag. She notes she has recovered and has residual congestion. She notes she has been on Xanax for her anxiety since her cancer diagnosis. She only takes this as needed, about 3 times a week. I reviewed her medication list with her. She notes she has both her COVID19 vaccines.     REVIEW OF SYSTEMS:   Constitutional: Denies fevers, chills or abnormal weight loss Eyes: Denies blurriness of vision Ears, nose, mouth, throat, and face: Denies mucositis or sore throat Respiratory: Denies cough, dyspnea or wheezes (+) Mild congestion  Cardiovascular: Denies palpitation, chest discomfort or lower extremity swelling Gastrointestinal:  Denies nausea, heartburn or change in bowel habits (+) Occasional colostomy leakage Skin: Denies abnormal skin rashes Lymphatics: Denies new lymphadenopathy or easy bruising Neurological:Denies numbness, tingling or new weaknesses Behavioral/Psych: Mood is stable, no new changes  All other systems were reviewed with the patient and are negative.  MEDICAL HISTORY:  Past Medical History:  Diagnosis Date  . Acute deep vein thrombosis (DVT) of brachial vein of right upper extremity ([place])   . Acute on chronic respiratory  failure with hypoxia ([place])   . Acute respiratory failure with hypoxemia ([place])   . Asthma   . [place], HX OF 01/06/2011   Qualifier: Diagnosis of  By: Charlett Blake MD, Erline Levine    . COPD (chronic obstructive pulmonary disease) ([place]) 2011   FeV1 31% predicted FeV1/FVX 47 %  . COPD, severe ([place])   . Essential hypertension 05/23/2020  . Hemorrhoid   . Open right radial fracture 2020  . Osteopenia   . Perforated sigmoid colon ([place]) 03/03/2020  . Pleural effusion   . Pneumonia 2021  . Postoperative intra-abdominal abscess 2021  . Seasonal allergies    takes Claritin daily prn  . Shock circulatory ([place])   . Tracheostomy status ([place])   . Vitamin D deficiency    takes Vit d every 14 days    SURGICAL HISTORY: Past Surgical History:  Procedure Laterality Date  . AUGMENTATION MAMMAPLASTY     saline  . EXAMINATION UNDER ANESTHESIA  10/14/2012   Procedure: EXAM UNDER ANESTHESIA;  Surgeon: Gayland Curry, MD,FACS;  Location: [place];  Service: General;  Laterality: N/A;  rectal exam under anesthesia excisional hemorrhoidectomy hemorrhoidal banding x two  . EXAMINATION UNDER ANESTHESIA  02/03/2013   excision hemorrhoidal tissue  . FOOT SURGERY     left bunionectomy  . HEMORRHOIDECTOMY WITH HEMORRHOID BANDING  10/14/2012   Procedure: HEMORRHOIDECTOMY WITH  HEMORRHOID BANDING;  Surgeon: Gayland Curry, MD,FACS;  Location: [place];  Service: General;  Laterality: N/A;  . IR THORACENTESIS ASP PLEURAL SPACE W/IMG GUIDE  04/23/2020  . LAPAROTOMY N/A 03/03/2020   Procedure: low anterior resection end colostomy;  Surgeon: [name], MD;  Location: [facility];  Service: General;  Laterality: N/A;  . LAPAROTOMY N/A 03/12/2020   Procedure: EXPLORATORY LAPAROTOMY WITH BOWEL RESECTION AND COLOSTOMY;  Surgeon: [name], MD;  Location: [facility];  Service: General;  Laterality: N/A;  . OPEN REDUCTION INTERNAL FIXATION (ORIF) DISTAL RADIAL FRACTURE Right 11/01/2018   Procedure: OPEN REDUCTION INTERNAL FIXATION (ORIF)  DISTAL RADIAL FRACTURE;  Surgeon: [name], MD;  Location: [place];  Service: Orthopedics;  Laterality: Right;  . TONSILLECTOMY  1960   recurrent otitis media  . US ECHOCARDIOGRAPHY  02/2020    poor windows, normal LV function, severely dilated RV with moderately reduced function, RV volume and pressure overload, mildly dilated RA    I have reviewed the social history and family history with the patient and they are unchanged from previous note.  ALLERGIES:  is allergic to amlodipine and codeine.  MEDICATIONS:  Current Outpatient Medications  Medication Sig Dispense Refill  . albuterol (PROVENTIL) (2.5 MG/3ML) 0.083% nebulizer solution Take by nebulization.    Marland Kitchen ALPRAZolam (XANAX) 0.25 MG tablet Take 1 tablet (0.25 mg total) by mouth at bedtime as needed for anxiety. 30 tablet 0  . docusate sodium (COLACE) 50 MG capsule Take 50 mg by mouth daily as needed for mild constipation.    . fexofenadine (ALLEGRA) 180 MG tablet Take 1 tablet (180 mg total) by mouth daily. 30 tablet 5  . fluticasone (FLONASE) 50 MCG/ACT nasal spray Place 2 sprays into both nostrils daily. 16 mL 5  . Fluticasone-Umeclidin-Vilant (TRELEGY ELLIPTA) 100-62.5-25 MCG/INH AEPB Inhale 1 puff into the lungs daily.    Marland Kitchen ipratropium-albuterol (DUONEB) 0.5-2.5 (3) MG/3ML SOLN Take 3 mLs by nebulization every 4 (four) hours as needed. 360 mL   . apixaban (ELIQUIS) 5 MG TABS tablet Take 1 tablet (5 mg total) by mouth 2 (two) times daily. (Patient not taking: Reported on 07/02/2020) 180 tablet 1  . azithromycin (ZITHROMAX) 500 MG tablet Take 1 tablet (500 mg total) by mouth daily. 3 tablet 0  . predniSONE (DELTASONE) 50 MG tablet Take 1 tablet (50 mg total) by mouth daily with breakfast. 4 tablet 0   No current facility-administered medications for this visit.    PHYSICAL EXAMINATION: ECOG PERFORMANCE STATUS: 1 - Symptomatic but completely ambulatory  Vitals:   07/12/20 1429  BP: (!) 174/87  Pulse: 85   Resp: 18  Temp: 97.8 F (36.6 C)  SpO2: 93%   Filed Weights   07/12/20 1429  Weight: 112 lb 3.2 oz (50.9 kg)    GENERAL:alert, no distress and comfortable SKIN: skin color, texture, turgor are normal, no rashes or significant lesions EYES: normal, Conjunctiva are pink and non-injected, sclera clear  NECK: supple, thyroid normal size, non-tender, without nodularity LYMPH:  no palpable lymphadenopathy in the cervical, axillary  LUNGS: clear to auscultation and percussion with normal breathing effort HEART: regular rate & rhythm and no murmurs and no lower extremity edema ABDOMEN:abdomen soft, non-tender and normal bowel sounds (+) Surgical incision healed well, now closed with pinkish scar tissue Musculoskeletal:no cyanosis of digits and no clubbing  NEURO: alert & oriented x 3 with fluent speech, no focal motor/sensory deficits  LABORATORY DATA:  I have reviewed the data as listed CBC  Latest Ref Rng & Units 07/12/2020 06/06/2020 04/28/2020  WBC 4.0 - 10.5 K/uL 8.6 8.3 11.4(H)  Hemoglobin 12.0 - 15.0 g/dL 12.8 12.0 10.7(L)  Hematocrit 36 - 46 % 40.4 38.3 32.4(L)  Platelets 150 - 400 K/uL 421(H) 470(H) 236     CMP Latest Ref Rng & Units 07/12/2020 06/06/2020 04/29/2020  Glucose 70 - 99 mg/dL 94 92 123(H)  BUN 8 - 23 mg/dL _0 Creatinine 0.44 - 1.00 mg/dL 0.78 0.69 0.43(L)  Sodium 135 - 145 mmol/L 142 141 135  Potassium 3.5 - 5.1 mmol/L 3.7 3.3(L) 3.5  Chloride 98 - 111 mmol/L 104 105 101  CO2 22 - 32 mmol/L _1 Calcium 8.9 - 10.3 mg/dL 10.1 9.5 8.4(L)  Total Protein 6.5 - 8.1 g/dL 7.0 6.8 -  Total Bilirubin 0.3 - 1.2 mg/dL 0.4 0.3 -  Alkaline Phos 38 - 126 U/L 88 79 -  AST 15 - 41 U/L 22 17 -  ALT 0 - 44 U/L 17 15 -      RADIOGRAPHIC STUDIES: I have personally reviewed the radiological images as listed and agreed with the findings in the report. No results found.   ASSESSMENT & PLAN:  SKYELER SCALESE is a 69 y.o. female with    1. Rectosigmoid cancer  (perforated), Stage III, pT4aN1b, with peroration, MSI Stable -She was diagnosed in 02/2020. She presented to ED on 03/03/20 due to severe pain, N/V/D. Workup showed she had bowel perforation and underwent emergent surgery. Pathology showed invasive rectosigmoid adenocarcinoma and perforated bowel with 3/15 positive LN. Margins were clear. She has permenant colostomy bag in place. Her initial CT scan was negative for distant mets -After surgery she had complications of infection/sepsis of colostomy which required another bowel resection, and had acute respiratory failure with pleural effusion, AKI, and RUE DVT. She required prolonged hospital stay and rehabilitation. She back at home now, recovering.  -I discussed based on her surgical pathology she has stage III colon cancer and with bowel perforation she has very high risk of cancer recurrence, above 50%.   -I discussed standard care is adjuvant chemotherapy to reduce her risk of recurrence, but given her complications after surgery and long recovery, it has been 4 months after her surgery, and she is no longer in beneficial window (3 months post-op) of chemo. I do not recommend chemo at this point.  -I recommend proceeding with cancer surveillance at this point. -She has been recovering from surgery well now. She is eating better, more active and her surgical incision has healed with scar tissue.  -I discussed given very high risk of recurrence and she is out of standard beneficial window for chemo, she has the option of Guardant Reveal. Her first test was negative.  She will proceed with the second testing today.  -if her GuardantReveal became positive, I will do scan sooner and may start her on chemo  -F/u in 08/2020    2. Weakness, Low Weight  -With hospitalization, colon surgery and complications she has lost a lot of weight.   -I will send dietician referral.  -After hospital discharge she had significant leg weakness from prior swelling. She  went to rehab and had OT and PT.  -She is home now, I encouraged her to be active. She has walker which I recommend she use  -She is eating better and adequate. She is able to maintain wight. No Weight gain yet.   3. Provoked RUE DVT -On 04/04/20, during  extended hospital stay the patient was noted to have RUE edema. A vascular duplex was ordered but not completed until 04/05/2020. This revealed a right subclavian and right axillary vein DVT associated with her PICC line.  -She completed 3 months of Eliquis.   4. Comorbidities: Asthma, COPD, Anxiety  -She has had significant asthma since she was a child which developed into COPD.  -She has not been hospitalized for exacerbation or needed home oxygen  -She is on inhaler and managed by pulmonologist.  -She has had anxiety before but this significantly worsened during her hospitalization. She feels better since being home. On Cymbalta and Xanax 0.50m 1-2 daily. -I discussed the opportunity to speak with our [place] may refer her to a specialist. She is open to this later.   4. Anemia   -Her 04/28/20 Hg at 10.7. Secondary to extended hospitalization, surgery and infection -Not currently on oral iron  -Her 06/06/20 labs show Ferritin 123, Iron 58, TIBC 268, Sat ratios 22.  -Anemia resolved now. Iron panel still pending (07/12/20)   PLAN:  -I refilled Xanax today  -proceed with second GuardantReveal today  -f/u in Sep with CT scan as scheduled    No problem-specific Assessment & Plan notes found for this encounter.   No orders of the defined types were placed in this encounter.  All questions were answered. The patient knows to call the clinic with any problems, questions or concerns. No barriers to learning was detected. The total time spent in the appointment was 30 minutes.     YTruitt Merle MD 07/12/2020   I, AJoslyn Devon am acting as scribe for YTruitt Merle MD.   I have reviewed the above documentation for accuracy  and completeness, and I agree with the above.

## 2020-07-09 ENCOUNTER — Inpatient Hospital Stay: Payer: Medicare HMO

## 2020-07-10 ENCOUNTER — Ambulatory Visit: Payer: Medicare HMO | Admitting: Hematology

## 2020-07-11 LAB — GUARDANT 360

## 2020-07-12 ENCOUNTER — Other Ambulatory Visit: Payer: Self-pay

## 2020-07-12 ENCOUNTER — Encounter: Payer: Self-pay | Admitting: Hematology

## 2020-07-12 ENCOUNTER — Inpatient Hospital Stay: Payer: Medicare HMO | Attending: Hematology | Admitting: Hematology

## 2020-07-12 ENCOUNTER — Inpatient Hospital Stay: Payer: Medicare HMO

## 2020-07-12 VITALS — BP 174/87 | HR 85 | Temp 97.8°F | Resp 18 | Ht 62.0 in | Wt 112.2 lb

## 2020-07-12 DIAGNOSIS — Z86718 Personal history of other venous thrombosis and embolism: Secondary | ICD-10-CM | POA: Insufficient documentation

## 2020-07-12 DIAGNOSIS — Z933 Colostomy status: Secondary | ICD-10-CM | POA: Diagnosis not present

## 2020-07-12 DIAGNOSIS — M8580 Other specified disorders of bone density and structure, unspecified site: Secondary | ICD-10-CM | POA: Diagnosis not present

## 2020-07-12 DIAGNOSIS — J449 Chronic obstructive pulmonary disease, unspecified: Secondary | ICD-10-CM | POA: Insufficient documentation

## 2020-07-12 DIAGNOSIS — F419 Anxiety disorder, unspecified: Secondary | ICD-10-CM | POA: Insufficient documentation

## 2020-07-12 DIAGNOSIS — D649 Anemia, unspecified: Secondary | ICD-10-CM | POA: Insufficient documentation

## 2020-07-12 DIAGNOSIS — Z7901 Long term (current) use of anticoagulants: Secondary | ICD-10-CM | POA: Insufficient documentation

## 2020-07-12 DIAGNOSIS — C20 Malignant neoplasm of rectum: Secondary | ICD-10-CM

## 2020-07-12 DIAGNOSIS — I10 Essential (primary) hypertension: Secondary | ICD-10-CM | POA: Diagnosis not present

## 2020-07-12 DIAGNOSIS — C19 Malignant neoplasm of rectosigmoid junction: Secondary | ICD-10-CM | POA: Insufficient documentation

## 2020-07-12 DIAGNOSIS — Z7952 Long term (current) use of systemic steroids: Secondary | ICD-10-CM | POA: Diagnosis not present

## 2020-07-12 DIAGNOSIS — Z79899 Other long term (current) drug therapy: Secondary | ICD-10-CM | POA: Insufficient documentation

## 2020-07-12 DIAGNOSIS — R69 Illness, unspecified: Secondary | ICD-10-CM | POA: Diagnosis not present

## 2020-07-12 LAB — CBC WITH DIFFERENTIAL (CANCER CENTER ONLY)
Abs Immature Granulocytes: 0.02 10*3/uL (ref 0.00–0.07)
Basophils Absolute: 0.1 10*3/uL (ref 0.0–0.1)
Basophils Relative: 1 %
Eosinophils Absolute: 0.2 10*3/uL (ref 0.0–0.5)
Eosinophils Relative: 2 %
HCT: 40.4 % (ref 36.0–46.0)
Hemoglobin: 12.8 g/dL (ref 12.0–15.0)
Immature Granulocytes: 0 %
Lymphocytes Relative: 29 %
Lymphs Abs: 2.5 10*3/uL (ref 0.7–4.0)
MCH: 29.4 pg (ref 26.0–34.0)
MCHC: 31.7 g/dL (ref 30.0–36.0)
MCV: 92.9 fL (ref 80.0–100.0)
Monocytes Absolute: 0.7 10*3/uL (ref 0.1–1.0)
Monocytes Relative: 8 %
Neutro Abs: 5.2 10*3/uL (ref 1.7–7.7)
Neutrophils Relative %: 60 %
Platelet Count: 421 10*3/uL — ABNORMAL HIGH (ref 150–400)
RBC: 4.35 MIL/uL (ref 3.87–5.11)
RDW: 13.5 % (ref 11.5–15.5)
WBC Count: 8.6 10*3/uL (ref 4.0–10.5)
nRBC: 0 % (ref 0.0–0.2)

## 2020-07-12 LAB — CMP (CANCER CENTER ONLY)
ALT: 17 U/L (ref 0–44)
AST: 22 U/L (ref 15–41)
Albumin: 3.8 g/dL (ref 3.5–5.0)
Alkaline Phosphatase: 88 U/L (ref 38–126)
Anion gap: 10 (ref 5–15)
BUN: 9 mg/dL (ref 8–23)
CO2: 28 mmol/L (ref 22–32)
Calcium: 10.1 mg/dL (ref 8.9–10.3)
Chloride: 104 mmol/L (ref 98–111)
Creatinine: 0.78 mg/dL (ref 0.44–1.00)
GFR, Est AFR Am: >60 mL/min (ref >60)
GFR, Estimated: >60 mL/min (ref >60)
Glucose, Bld: 94 mg/dL (ref 70–99)
Potassium: 3.7 mmol/L (ref 3.5–5.1)
Sodium: 142 mmol/L (ref 135–145)
Total Bilirubin: 0.4 mg/dL (ref 0.3–1.2)
Total Protein: 7 g/dL (ref 6.5–8.1)

## 2020-07-12 MED ORDER — ALPRAZOLAM 0.25 MG PO TABS: 0.2500 mg | ORAL_TABLET | Freq: Every evening | ORAL | 0 refills | Status: DC | PRN

## 2020-07-13 ENCOUNTER — Encounter: Payer: Self-pay | Admitting: Hematology

## 2020-07-15 LAB — IRON AND TIBC
Iron: 101 ug/dL (ref 41–142)
Saturation Ratios: 37 % (ref 21–57)
TIBC: 275 ug/dL (ref 236–444)
UIBC: 174 ug/dL (ref 120–384)

## 2020-07-15 LAB — CEA (IN HOUSE-CHCC): CEA (CHCC-In House): 1.84 ng/mL (ref 0.00–5.00)

## 2020-07-15 LAB — FERRITIN: Ferritin: 64 ng/mL (ref 11–307)

## 2020-07-19 ENCOUNTER — Encounter: Payer: Self-pay | Admitting: Hematology

## 2020-07-19 LAB — GUARDANT 360

## 2020-07-20 DIAGNOSIS — J9600 Acute respiratory failure, unspecified whether with hypoxia or hypercapnia: Secondary | ICD-10-CM | POA: Diagnosis not present

## 2020-07-20 DIAGNOSIS — J449 Chronic obstructive pulmonary disease, unspecified: Secondary | ICD-10-CM | POA: Diagnosis not present

## 2020-07-20 DIAGNOSIS — R69 Illness, unspecified: Secondary | ICD-10-CM | POA: Diagnosis not present

## 2020-07-20 DIAGNOSIS — C20 Malignant neoplasm of rectum: Secondary | ICD-10-CM | POA: Diagnosis not present

## 2020-07-20 DIAGNOSIS — J90 Pleural effusion, not elsewhere classified: Secondary | ICD-10-CM | POA: Diagnosis not present

## 2020-07-20 DIAGNOSIS — E43 Unspecified severe protein-calorie malnutrition: Secondary | ICD-10-CM | POA: Diagnosis not present

## 2020-07-20 DIAGNOSIS — I82621 Acute embolism and thrombosis of deep veins of right upper extremity: Secondary | ICD-10-CM | POA: Diagnosis not present

## 2020-07-20 DIAGNOSIS — R1310 Dysphagia, unspecified: Secondary | ICD-10-CM | POA: Diagnosis not present

## 2020-07-20 DIAGNOSIS — M6281 Muscle weakness (generalized): Secondary | ICD-10-CM | POA: Diagnosis not present

## 2020-07-21 DIAGNOSIS — C20 Malignant neoplasm of rectum: Secondary | ICD-10-CM | POA: Diagnosis not present

## 2020-07-21 DIAGNOSIS — C187 Malignant neoplasm of sigmoid colon: Secondary | ICD-10-CM | POA: Diagnosis not present

## 2020-07-23 ENCOUNTER — Ambulatory Visit: Payer: Medicare HMO | Admitting: Adult Health

## 2020-07-23 DIAGNOSIS — R69 Illness, unspecified: Secondary | ICD-10-CM | POA: Diagnosis not present

## 2020-07-26 DIAGNOSIS — Z933 Colostomy status: Secondary | ICD-10-CM | POA: Diagnosis not present

## 2020-07-27 DIAGNOSIS — Z933 Colostomy status: Secondary | ICD-10-CM | POA: Diagnosis not present

## 2020-07-28 DIAGNOSIS — Z933 Colostomy status: Secondary | ICD-10-CM | POA: Diagnosis not present

## 2020-07-29 DIAGNOSIS — Z933 Colostomy status: Secondary | ICD-10-CM | POA: Diagnosis not present

## 2020-07-30 ENCOUNTER — Telehealth: Payer: Self-pay

## 2020-07-30 DIAGNOSIS — Z933 Colostomy status: Secondary | ICD-10-CM | POA: Diagnosis not present

## 2020-07-30 NOTE — Telephone Encounter (Signed)
I let MR and Mrs Payment know that her 2nd guardantreveal test was negative.  I also rescheduled her lab appt to the day of her CT scan.  They verbalized understanding.

## 2020-07-31 DIAGNOSIS — Z933 Colostomy status: Secondary | ICD-10-CM | POA: Diagnosis not present

## 2020-08-01 ENCOUNTER — Encounter: Payer: Self-pay | Admitting: Hematology

## 2020-08-01 DIAGNOSIS — Z933 Colostomy status: Secondary | ICD-10-CM | POA: Diagnosis not present

## 2020-08-02 DIAGNOSIS — Z933 Colostomy status: Secondary | ICD-10-CM | POA: Diagnosis not present

## 2020-08-03 DIAGNOSIS — Z933 Colostomy status: Secondary | ICD-10-CM | POA: Diagnosis not present

## 2020-08-04 DIAGNOSIS — Z933 Colostomy status: Secondary | ICD-10-CM | POA: Diagnosis not present

## 2020-08-13 ENCOUNTER — Telehealth: Payer: Self-pay | Admitting: Pulmonary Disease

## 2020-08-13 DIAGNOSIS — J449 Chronic obstructive pulmonary disease, unspecified: Secondary | ICD-10-CM

## 2020-08-13 NOTE — Telephone Encounter (Signed)
Called and spoke with pt who states she is needing a nebulizer prescription to be sent to [facility] at fax number [phone]. On the Rx, reference order needs to be written on there. Reference number provided by pt is [id number]  Pt stated her previous nebulizer machine is at least 67 years old and it has broken. Tammy, please advise if you are okay with [name] sending Rx for new nebulizer to provided place for pt.

## 2020-08-13 NOTE — Telephone Encounter (Signed)
Pt returning a phone call. Pt can be reached at [phone].

## 2020-08-13 NOTE — Telephone Encounter (Signed)
Rx for new nebulizer machine has been placed. Called and spoke with pt letting her know this had been done and she verbalized understanding.  Nothing further needed.

## 2020-08-13 NOTE — Telephone Encounter (Signed)
Yes

## 2020-08-13 NOTE — Telephone Encounter (Signed)
Left a voicemail for patient to call our office back regarding refill on neb meds.

## 2020-08-15 ENCOUNTER — Ambulatory Visit: Payer: Medicare HMO | Admitting: Pulmonary Disease

## 2020-08-15 ENCOUNTER — Ambulatory Visit: Payer: Medicare HMO | Admitting: Adult Health

## 2020-08-20 DIAGNOSIS — C20 Malignant neoplasm of rectum: Secondary | ICD-10-CM | POA: Diagnosis not present

## 2020-08-20 DIAGNOSIS — J449 Chronic obstructive pulmonary disease, unspecified: Secondary | ICD-10-CM | POA: Diagnosis not present

## 2020-08-20 DIAGNOSIS — M6281 Muscle weakness (generalized): Secondary | ICD-10-CM | POA: Diagnosis not present

## 2020-08-20 DIAGNOSIS — J90 Pleural effusion, not elsewhere classified: Secondary | ICD-10-CM | POA: Diagnosis not present

## 2020-08-20 DIAGNOSIS — R69 Illness, unspecified: Secondary | ICD-10-CM | POA: Diagnosis not present

## 2020-08-20 DIAGNOSIS — J9600 Acute respiratory failure, unspecified whether with hypoxia or hypercapnia: Secondary | ICD-10-CM | POA: Diagnosis not present

## 2020-08-20 DIAGNOSIS — I82621 Acute embolism and thrombosis of deep veins of right upper extremity: Secondary | ICD-10-CM | POA: Diagnosis not present

## 2020-08-20 DIAGNOSIS — R1310 Dysphagia, unspecified: Secondary | ICD-10-CM | POA: Diagnosis not present

## 2020-08-20 DIAGNOSIS — E43 Unspecified severe protein-calorie malnutrition: Secondary | ICD-10-CM | POA: Diagnosis not present

## 2020-08-26 DIAGNOSIS — R69 Illness, unspecified: Secondary | ICD-10-CM | POA: Diagnosis not present

## 2020-08-27 ENCOUNTER — Other Ambulatory Visit: Payer: Self-pay

## 2020-08-27 ENCOUNTER — Inpatient Hospital Stay: Payer: Medicare HMO | Attending: Hematology

## 2020-08-27 ENCOUNTER — Encounter (HOSPITAL_COMMUNITY): Payer: Self-pay

## 2020-08-27 ENCOUNTER — Ambulatory Visit (HOSPITAL_COMMUNITY)
Admission: RE | Admit: 2020-08-27 | Discharge: 2020-08-27 | Disposition: A | Discharge status: Home / Self Care | Payer: Medicare HMO | Source: Ambulatory Visit | Attending: Hematology | Admitting: Hematology

## 2020-08-27 DIAGNOSIS — C19 Malignant neoplasm of rectosigmoid junction: Secondary | ICD-10-CM | POA: Diagnosis not present

## 2020-08-27 DIAGNOSIS — J449 Chronic obstructive pulmonary disease, unspecified: Secondary | ICD-10-CM | POA: Diagnosis not present

## 2020-08-27 DIAGNOSIS — I10 Essential (primary) hypertension: Secondary | ICD-10-CM | POA: Insufficient documentation

## 2020-08-27 DIAGNOSIS — N12 Tubulo-interstitial nephritis, not specified as acute or chronic: Secondary | ICD-10-CM | POA: Diagnosis not present

## 2020-08-27 DIAGNOSIS — C20 Malignant neoplasm of rectum: Secondary | ICD-10-CM

## 2020-08-27 DIAGNOSIS — Z7901 Long term (current) use of anticoagulants: Secondary | ICD-10-CM | POA: Diagnosis not present

## 2020-08-27 DIAGNOSIS — Z86718 Personal history of other venous thrombosis and embolism: Secondary | ICD-10-CM | POA: Diagnosis not present

## 2020-08-27 DIAGNOSIS — K7689 Other specified diseases of liver: Secondary | ICD-10-CM | POA: Diagnosis not present

## 2020-08-27 DIAGNOSIS — Z933 Colostomy status: Secondary | ICD-10-CM | POA: Insufficient documentation

## 2020-08-27 DIAGNOSIS — Z79899 Other long term (current) drug therapy: Secondary | ICD-10-CM | POA: Diagnosis not present

## 2020-08-27 DIAGNOSIS — F419 Anxiety disorder, unspecified: Secondary | ICD-10-CM | POA: Insufficient documentation

## 2020-08-27 DIAGNOSIS — Z930 Tracheostomy status: Secondary | ICD-10-CM | POA: Insufficient documentation

## 2020-08-27 DIAGNOSIS — Z7952 Long term (current) use of systemic steroids: Secondary | ICD-10-CM | POA: Insufficient documentation

## 2020-08-27 DIAGNOSIS — C189 Malignant neoplasm of colon, unspecified: Secondary | ICD-10-CM | POA: Diagnosis not present

## 2020-08-27 DIAGNOSIS — M8580 Other specified disorders of bone density and structure, unspecified site: Secondary | ICD-10-CM | POA: Insufficient documentation

## 2020-08-27 DIAGNOSIS — R69 Illness, unspecified: Secondary | ICD-10-CM | POA: Diagnosis not present

## 2020-08-27 LAB — CBC WITH DIFFERENTIAL (CANCER CENTER ONLY)
Abs Immature Granulocytes: 0.02 10*3/uL (ref 0.00–0.07)
Basophils Absolute: 0.1 10*3/uL (ref 0.0–0.1)
Basophils Relative: 1 %
Eosinophils Absolute: 0.1 10*3/uL (ref 0.0–0.5)
Eosinophils Relative: 2 %
HCT: 45.8 % (ref 36.0–46.0)
Hemoglobin: 14.6 g/dL (ref 12.0–15.0)
Immature Granulocytes: 0 %
Lymphocytes Relative: 37 %
Lymphs Abs: 2.6 10*3/uL (ref 0.7–4.0)
MCH: 29.4 pg (ref 26.0–34.0)
MCHC: 31.9 g/dL (ref 30.0–36.0)
MCV: 92.3 fL (ref 80.0–100.0)
Monocytes Absolute: 0.7 10*3/uL (ref 0.1–1.0)
Monocytes Relative: 10 %
Neutro Abs: 3.5 10*3/uL (ref 1.7–7.7)
Neutrophils Relative %: 50 %
Platelet Count: 328 10*3/uL (ref 150–400)
RBC: 4.96 MIL/uL (ref 3.87–5.11)
RDW: 14.7 % (ref 11.5–15.5)
WBC Count: 7 10*3/uL (ref 4.0–10.5)
nRBC: 0 % (ref 0.0–0.2)

## 2020-08-27 LAB — CMP (CANCER CENTER ONLY)
ALT: 17 U/L (ref 0–44)
AST: 23 U/L (ref 15–41)
Albumin: 4.1 g/dL (ref 3.5–5.0)
Alkaline Phosphatase: 123 U/L (ref 38–126)
Anion gap: 9 (ref 5–15)
BUN: 16 mg/dL (ref 8–23)
CO2: 28 mmol/L (ref 22–32)
Calcium: 10 mg/dL (ref 8.9–10.3)
Chloride: 104 mmol/L (ref 98–111)
Creatinine: 0.91 mg/dL (ref 0.44–1.00)
GFR, Est AFR Am: >60 mL/min (ref >60)
GFR, Estimated: >60 mL/min (ref >60)
Glucose, Bld: 90 mg/dL (ref 70–99)
Potassium: 4.5 mmol/L (ref 3.5–5.1)
Sodium: 141 mmol/L (ref 135–145)
Total Bilirubin: 0.4 mg/dL (ref 0.3–1.2)
Total Protein: 7.8 g/dL (ref 6.5–8.1)

## 2020-08-27 LAB — FERRITIN: Ferritin: 34 ng/mL (ref 11–307)

## 2020-08-27 LAB — IRON AND TIBC
Iron: 84 ug/dL (ref 41–142)
Saturation Ratios: 23 % (ref 21–57)
TIBC: 366 ug/dL (ref 236–444)
UIBC: 282 ug/dL (ref 120–384)

## 2020-08-27 LAB — CEA (IN HOUSE-CHCC): CEA (CHCC-In House): 3.19 ng/mL (ref 0.00–5.00)

## 2020-08-27 MED ORDER — IOHEXOL 300 MG/ML  SOLN
100.0000 mL | Freq: Once | INTRAMUSCULAR | Status: AC | PRN
  Administered 2020-08-27: 100 mL via INTRAVENOUS

## 2020-08-29 ENCOUNTER — Telehealth: Payer: Self-pay

## 2020-08-29 DIAGNOSIS — R69 Illness, unspecified: Secondary | ICD-10-CM | POA: Diagnosis not present

## 2020-08-29 NOTE — Telephone Encounter (Signed)
I spoke with Tracy [name] regarding her CT Scan and the pocket of fluid seen left upper quadrant.  She denies fever and abd pain.

## 2020-08-30 ENCOUNTER — Other Ambulatory Visit: Payer: Medicare HMO

## 2020-08-30 DIAGNOSIS — Z933 Colostomy status: Secondary | ICD-10-CM | POA: Diagnosis not present

## 2020-08-30 NOTE — Progress Notes (Signed)
[place]   Telephone:([phone] Fax:(336) [phone]   Clinic Follow up Note   Patient Care Team: [name], DO as PCP - General (Family Medicine) Jonnie Finner, RN as Oncology Nurse Navigator [name], MD as Consulting Physician (Hematology) Icard, Octavio Graves, DO as Consulting Physician (Pulmonary Disease) [facility], P.A. [name], MD as Consulting Physician (General Surgery) [name], MD as Consulting Physician (General Surgery)  Date of Service:  09/02/2020  CHIEF COMPLAINT: F/u of rectal cancer   SUMMARY OF ONCOLOGIC HISTORY: Oncology History  Rectal cancer ([place])  03/03/2020 - 04/05/2020 Hospital Admission   She was admitted to ED on 03/03/20 for abdominal pain and nausea. She had been having diarrhea for several weeks. During hospital stay she developed acute respiratory failure, AKI, RUE DVT from PICC line. Work up showed bowel perforation, small left liver mass and thickening of jejunum. She underwent emergent surgery on 03/03/20 for resection and colostomy placement. Her path showed invasive cancer, metastatic to 3/15 LNs. She had a NGT in placed but this was removed on POD 3. Post op her stoma became necrotic and septic. She had another bowel surgery on 03/12/20, which was NED with necrotic tissue.    03/03/2020 Imaging   CT AP 03/03/20  IMPRESSION: 1. Free intraperitoneal air consistent perforation. Most likely site of perforation is in the distal splenic flexure/proximal descending colon where there is a collection of air and gas measuring 6 centimeters. No obvious soft tissue mass identified in this region. At this site, there is abrupt transition of dilated, stool-filled colon to completely decompressed proximal descending colon. 2. Thickened, inflamed loops of jejunum are identified within the pelvis and are likely reactive. 3. Small hiatal hernia. 4. Benign-appearing 1.6 centimeter mass the LEFT hepatic  lobe. Recommend comparison with prior studies if available. 5.  Emphysema (ICD10-J43.9). 6.  Aortic Atherosclerosis (ICD10-I70.0). 7. Bilateral renal scarring.   03/03/2020 Surgery   low anterior resection end colostomy by Dr Marcello Moores    03/03/2020 Initial Biopsy   FINAL MICROSCOPIC DIAGNOSIS: 03/03/20 A. RECTOSIGMOID COLON, LOW ANTERIOR RESECTION:  - Invasive colonic adenocarcinoma, 3.5 cm.  - Tumor invades the visceral peritoneum.  - Margins of resection are not involved.  - Metastatic carcinoma in (3) of (15) lymph nodes.  - See oncology table.   B. ADDITIONAL SIGMOID COLON, RESECTION:  - Colonic tissue, negative for carcinoma.  ADDENDUM:  Mismatch Repair Protein (IHC)   SUMMARY INTERPRETATION: NORMAL  There is preserved expression of the major MMR proteins. There is a very  low probability that microsatellite instability (MSI) is present.  However, certain clinically significant MMR protein mutations may result  in preservation of nuclear expression. It is recommended that the  preservation of protein expression be correlated with molecular based  MSI testing.   IHC EXPRESSION RESULTS  TEST           RESULT  MLH1:          Preserved nuclear expression  MSH2:          Preserved nuclear expression  MSH6:          Preserved nuclear expression  PMS2:          Preserved nuclear expression   03/08/2020 Imaging   CT AP 03/08/20 IMPRESSION: 1. Interval midline laparotomy with distal colon resection and diverting left lower quadrant colostomy. 2. Diffuse small bowel dilatation with gas fluid levels most consistent with postoperative ileus. 3. Trace ascites within the abdomen and pelvis. No  fluid collection or abscess at this time. Surgical drain within the lower pelvis. 4. Indeterminate 1.4 cm subcapsular liver hypodensity. In light of newly diagnosed rectal cancer, metastatic disease cannot be excluded. PET CT may be useful for further evaluation. 5. Interval development of  trace bilateral pleural effusions and diffuse body wall edema.   03/12/2020 Surgery   EXPLORATORY LAPAROTOMY WITH BOWEL RESECTION AND COLOSTOMY by Dr Hassell Done 03/12/20  FINAL MICROSCOPIC DIAGNOSIS: 03/12/20  A. COLON, SPLENIC FLEXURE, RESECTION:  - Segment of colon (37 cm) with perforation and associated inflammation  - Multiple mucosal ulcers with necrotizing inflammation  - No evidence of malignancy    03/12/2020 Imaging   CT AP WO contrast 03/12/20  IMPRESSION: 1. Exam is limited by lack of intravenous contrast, motion, and artifact from the patient's arms adjacent to the torso. Since 03/08/2020, there has been development of relatively large pockets of intraperitoneal free air. While intraperitoneal gas would not be unexpected on postoperative day 9, this gas is new since an intervening study of 03/08/2020 and given the relatively large volume certainly raises concern for bowel perforation. No source for the intraperitoneal free air is evident on this study. There is a surgical drain in the pelvis, but there is no free gas around the drain itself to suggest that it represents the source. 2. New circumferential wall thickening in the splenic flexure, descending colon and sigmoid colon leading into the end colostomy. Infection/inflammation would be a consideration. Ischemia cannot be excluded. 3. Relatively small volume intraperitoneal free fluid. High attenuation small fluid collections in the left upper abdomen may reflect hemorrhage, infection or residua from prior perforation. 4. Interval progression of diffuse body wall edema. 5. Residual contrast material in the renal parenchyma from prior imaging, compatible with renal dysfunction.    03/19/2020 Imaging   CT CAP 03/19/20  IMPRESSION: 1. 5.5 x 3.2 cm fluid collection is noted along the greater curvature of the proximal stomach. 2. Surgical drain is again noted in the pelvis with tip in left lower quadrant. 3. Interval  development of crescent-shaped fluid collection measuring 16.4 x 3.3 cm in the epigastric region and left upper quadrant of the abdomen which may extend into the left lower quadrant. Potentially this may represent abscess or developing abscess. Multiple other smaller fluid collections are noted which may represent small abscesses. 4. 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 5. Mild amount of free fluid is noted in the posterior pelvis. 6. Moderate anasarca is noted. Aortic Atherosclerosis (ICD10-I70.0).   04/12/2020 Imaging   CT AP 04/12/20  IMPRESSION: 1. Fluid collections within the LEFT abdomen have significantly decreased compared to previous CT exams, now nearly completely resolved. 2. Percutaneous drainage catheter with tip coiled posterior to the LEFT kidney, stable positioning compared to the previous study. The more anterior catheter has been removed. 3. Small amount of free fluid persists within the abdomen and pelvis. 4. Trace bibasilar pleural effusions. 5. Anasarca. 6. While reviewing today's study, comparing with a chest/abdomen/pelvis CT from earlier same month, there is question of thrombus in the LEFT internal jugular vein. Recommend ultrasound of the LEFT IJ to exclude DVT. This recommendation discussed with patient's hospitalist, Dr. Owens Shark, on 04/12/2020 at 4:20 p.m.   Emphysema (ICD10-J43.9).   04/22/2020 Imaging   CT AP 04/22/20  IMPRESSION: 1. No recurrent intra-abdominal abscess status post interval removal of left retroperitoneal percutaneous drain. Stable small amount of free pelvic fluid. 2. Enlarging dependent bilateral pleural effusions, now moderate in volume. Associated atelectasis at both  lung bases. 3. Progressive anasarca with generalized edema throughout the subcutaneous fat. 4. Stable small subcapsular fluid collection along the anterior aspect of the left hepatic lobe. 5. Aortic Atherosclerosis (ICD10-I70.0).   05/23/2020 Initial  Diagnosis   Rectal cancer ([place])      CURRENT THERAPY:  Surveillance   INTERVAL HISTORY:  Tracy [name] is here for a follow up. She presents to the clinic with her husband. She notes she is doing well. She continues to recover from surgery. She is not back to baseline yet but continues to improve. She is able to do laundry but her husband is cooking and most the cleaning. She has been able to gain more weight. She has started to walking more, but feels she can do more. She is up more than 50% of the day, but notes she does not have the strength to stand for long periods of time. She denies any abdominal pain or persistent bloating and no blood in stool output. I reviewed her medication list with her.  She notes she has nebulizer, but no rescue inhaler. She has not needed one so far. She last saw surgeon 2 months ago and does not plan to f/u with her.     REVIEW OF SYSTEMS:   Constitutional: Denies fevers, chills or abnormal weight loss Eyes: Denies blurriness of vision Ears, nose, mouth, throat, and face: Denies mucositis or sore throat Respiratory: Denies cough, dyspnea or wheezes Cardiovascular: Denies palpitation, chest discomfort or lower extremity swelling Gastrointestinal:  Denies nausea, heartburn or change in bowel habits Skin: Denies abnormal skin rashes Lymphatics: Denies new lymphadenopathy or easy bruising Neurological:Denies numbness, tingling or new weaknesses Behavioral/Psych: Mood is stable, no new changes  All other systems were reviewed with the patient and are negative.  MEDICAL HISTORY:  Past Medical History:  Diagnosis Date   Acute deep vein thrombosis (DVT) of brachial vein of right upper extremity (HCC)    Acute on chronic respiratory failure with hypoxia (HCC)    Acute respiratory failure with hypoxemia (HCC)    Asthma    Cancer ([place])    [place], HX OF 01/06/2011   Qualifier: Diagnosis of  By: Charlett Blake MD, Stacey     COPD (chronic obstructive  pulmonary disease) ([place]) 2011   FeV1 31% predicted FeV1/FVX 47 %   COPD, severe ([place])    Essential hypertension 05/23/2020   Hemorrhoid    Open right radial fracture 2020   Osteopenia    Perforated sigmoid colon ([place]) 03/03/2020   Pleural effusion    Pneumonia 2021   Postoperative intra-abdominal abscess 2021   Seasonal allergies    takes Claritin daily prn   Shock circulatory ([place])    Tracheostomy status ([place])    Vitamin D deficiency    takes Vit d every 14 days    SURGICAL HISTORY: Past Surgical History:  Procedure Laterality Date   AUGMENTATION MAMMAPLASTY     saline   EXAMINATION UNDER ANESTHESIA  10/14/2012   Procedure: EXAM UNDER ANESTHESIA;  Surgeon: Gayland Curry, MD,FACS;  Location: [facility];  Service: General;  Laterality: N/A;  rectal exam under anesthesia excisional hemorrhoidectomy hemorrhoidal banding x two   EXAMINATION UNDER ANESTHESIA  02/03/2013   excision hemorrhoidal tissue   FOOT SURGERY     left bunionectomy   HEMORRHOIDECTOMY WITH HEMORRHOID BANDING  10/14/2012   Procedure: HEMORRHOIDECTOMY WITH HEMORRHOID BANDING;  Surgeon: Gayland Curry, MD,FACS;  Location: [facility];  Service: General;  Laterality: N/A;   IR THORACENTESIS ASP PLEURAL SPACE  W/IMG GUIDE  04/23/2020   LAPAROTOMY N/A 03/03/2020   Procedure: low anterior resection end colostomy;  Surgeon: [name], MD;  Location: [facility];  Service: General;  Laterality: N/A;   LAPAROTOMY N/A 03/12/2020   Procedure: EXPLORATORY LAPAROTOMY WITH BOWEL RESECTION AND COLOSTOMY;  Surgeon: [name], MD;  Location: [facility];  Service: General;  Laterality: N/A;   OPEN REDUCTION INTERNAL FIXATION (ORIF) DISTAL RADIAL FRACTURE Right 11/01/2018   Procedure: OPEN REDUCTION INTERNAL FIXATION (ORIF) DISTAL RADIAL FRACTURE;  Surgeon: [name], MD;  Location: [facility];  Service: Orthopedics;  Laterality: Right;   TONSILLECTOMY  1960   recurrent otitis media   US ECHOCARDIOGRAPHY   02/2020    poor windows, normal LV function, severely dilated RV with moderately reduced function, RV volume and pressure overload, mildly dilated RA    I have reviewed the social history and family history with the patient and they are unchanged from previous note.  ALLERGIES:  is allergic to amlodipine and codeine.  MEDICATIONS:  Current Outpatient Medications  Medication Sig Dispense Refill   albuterol (PROVENTIL) (2.5 MG/3ML) 0.083% nebulizer solution Take by nebulization.     ALPRAZolam (XANAX) 0.25 MG tablet Take 1 tablet (0.25 mg total) by mouth at bedtime as needed for anxiety. 30 tablet 0   apixaban (ELIQUIS) 5 MG TABS tablet Take 1 tablet (5 mg total) by mouth 2 (two) times daily. (Patient not taking: Reported on 07/02/2020) 180 tablet 1   azithromycin (ZITHROMAX) 500 MG tablet Take 1 tablet (500 mg total) by mouth daily. 3 tablet 0   docusate sodium (COLACE) 50 MG capsule Take 50 mg by mouth daily as needed for mild constipation.     fexofenadine (ALLEGRA) 180 MG tablet Take 1 tablet (180 mg total) by mouth daily. 30 tablet 5   fluticasone (FLONASE) 50 MCG/ACT nasal spray Place 2 sprays into both nostrils daily. 16 mL 5   Fluticasone-Umeclidin-Vilant (TRELEGY ELLIPTA) 100-62.5-25 MCG/INH AEPB Inhale 1 puff into the lungs daily.     ipratropium-albuterol (DUONEB) 0.5-2.5 (3) MG/3ML SOLN Take 3 mLs by nebulization every 4 (four) hours as needed. 360 mL    predniSONE (DELTASONE) 50 MG tablet Take 1 tablet (50 mg total) by mouth daily with breakfast. 4 tablet 0   No current facility-administered medications for this visit.    PHYSICAL EXAMINATION: ECOG PERFORMANCE STATUS: 2 - Symptomatic, <50% confined to bed  Vitals:   09/02/20 0947  BP: (!) 152/96  Pulse: 89  Resp: 20  Temp: 97.8 F (36.6 C)  SpO2: 92%   Filed Weights   09/02/20 0947  Weight: 118 lb 12.8 oz (53.9 kg)    GENERAL:alert, no distress and comfortable SKIN: skin color, texture, turgor are  normal, no rashes or significant lesions EYES: normal, Conjunctiva are pink and non-injected, sclera clear  NECK: supple, thyroid normal size, non-tender, without nodularity LYMPH:  no palpable lymphadenopathy in the cervical, axillary  LUNGS: clear to auscultation and percussion (+) Decreased breath sounds HEART: regular rate & rhythm and no murmurs and no lower extremity edema ABDOMEN:abdomen soft, non-tender and normal bowel sounds (+) Surgical incision healed well (+) Colostomy bag in place and clean Musculoskeletal:no cyanosis of digits and no clubbing  NEURO: alert & oriented x 3 with fluent speech, no focal motor/sensory deficits  LABORATORY DATA:  I have reviewed the data as listed CBC Latest Ref Rng & Units 08/27/2020 07/12/2020 06/06/2020  WBC 4.0 - 10.5 K/uL 7.0 8.6 8.3  Hemoglobin 12.0 - 15.0 g/dL  14.6 12.8 12.0  Hematocrit 36 - 46 % 45.8 40.4 38.3  Platelets 150 - 400 K/uL 328 421(H) 470(H)     CMP Latest Ref Rng & Units 08/27/2020 07/12/2020 06/06/2020  Glucose 70 - 99 mg/dL 90 94 92  BUN 8 - 23 mg/dL _0 Creatinine 0.44 - 1.00 mg/dL 0.91 0.78 0.69  Sodium 135 - 145 mmol/L 141 142 141  Potassium 3.5 - 5.1 mmol/L 4.5 3.7 3.3(L)  Chloride 98 - 111 mmol/L 104 104 105  CO2 22 - 32 mmol/L _1 Calcium 8.9 - 10.3 mg/dL 10.0 10.1 9.5  Total Protein 6.5 - 8.1 g/dL 7.8 7.0 6.8  Total Bilirubin 0.3 - 1.2 mg/dL 0.4 0.4 0.3  Alkaline Phos 38 - 126 U/L 123 88 79  AST 15 - 41 U/L _2 ALT 0 - 44 U/L _3 RADIOGRAPHIC STUDIES: I have personally reviewed the radiological images as listed and agreed with the findings in the report. No results found.   ASSESSMENT & PLAN:  SHANEA KARNEY is a 69 y.o. female with    1. Rectosigmoid cancer(perforated),Stage III,pT4aN1b,with peroration,MSI Stable -She was diagnosed in 02/2020. Workup showed she had bowel perforation and underwent emergent surgery. Pathology showed invasiverectosigmoidadenocarcinoma and  perforatedbowel with3/15positiveLN.Margins were clear. She has permanent colostomy bag in place. Her initial CT scan was negative for distant mets. -She has stage III colon cancer and with bowel perforation she hasveryhigh risk of cancer recurrence, unfortunately she was not a candidate for adjuvant chemo after surgery due to very slow recovery -She did opt to proceed with Guardant Reveal study in 06/06/20 and 07/12/20 was negative.  -She continues to recover from surgery, she is not back to baseline yet. Labs reviewed from last week, CBC and CMP WNL. CEA normal. Physical exam unremarkable.  -I reviewed and discussed her 08/27/20 CT AP images which shows surgical changes at site such as resolving fluid collection, scaring and abdominal hernia, but no evidence of disease. Will continue surveillance.  -She has not had colonoscopy. Given her cancer, prior emergent colectomy and high risk of recurrence, I recommend surveillance colonoscopy. Will proceed in 12/2020 given her gap in her insurance now.  -Continue scans every 6 months for the first 1-2 years of 5 year surveillance plan.  -F/u in 3 months with lab    2. Weakness, Low Weight  -With prolonged hospitalization with complications she had lost significant weight and developed leg weakness.  -She is recovering and has received both OT and PT.  -She continues to regain strength and has gained weight form better eating. I encouraged her to continue to increase activity level to get back to baseline.   3.ProvokedRUE DVT on 04/04/20. S/p 3 months of Eliquis  4. Comorbidities: Asthma, COPD, Anxiety  -On nebulizer, Cymbalta and Xanax 0.60m 1-2 daily, Benadryl and Flonase  -Continue to f/u with pulmonologist and she can request rescue inhaler from them.   5. Financial support  -She notes she is currently in a gap of her [employer] She plans to talk with insurance this week.  -I suggest she see our financial aid office and advocate to  see if she is [facility]   6. Cancer Screening  -Her last Mammogram was in 2014, I recommend she proceed with scan this year and continue yearly. She is agreeable.  -I encouraged her to stay up to date on age appropriate cancer screenings.  PLAN: -lab and CT scan reviewed, NED -Lab and f/u in 3 months -I gave her our financial advocate's contact info, she will call -hold on colonoscopy for now due to her gap in insurance coverage     No problem-specific Assessment & Plan notes found for this encounter.   No orders of the defined types were placed in this encounter.  All questions were answered. The patient knows to call the clinic with any problems, questions or concerns. No barriers to learning was detected. The total time spent in the appointment was 30 minutes.     [name], MD 09/02/2020   I, Joslyn Devon, am acting as scribe for [name], MD.   I have reviewed the above documentation for accuracy and completeness, and I agree with the above.

## 2020-08-31 DIAGNOSIS — Z933 Colostomy status: Secondary | ICD-10-CM | POA: Diagnosis not present

## 2020-09-01 DIAGNOSIS — Z933 Colostomy status: Secondary | ICD-10-CM | POA: Diagnosis not present

## 2020-09-02 ENCOUNTER — Inpatient Hospital Stay: Payer: Medicare HMO | Admitting: Hematology

## 2020-09-02 ENCOUNTER — Telehealth: Payer: Self-pay | Admitting: Hematology

## 2020-09-02 ENCOUNTER — Other Ambulatory Visit: Payer: Self-pay

## 2020-09-02 ENCOUNTER — Encounter: Payer: Self-pay | Admitting: Hematology

## 2020-09-02 VITALS — BP 152/96 | HR 89 | Temp 97.8°F | Resp 20 | Ht 62.0 in | Wt 118.8 lb

## 2020-09-02 DIAGNOSIS — C20 Malignant neoplasm of rectum: Secondary | ICD-10-CM | POA: Diagnosis not present

## 2020-09-02 DIAGNOSIS — C19 Malignant neoplasm of rectosigmoid junction: Secondary | ICD-10-CM | POA: Diagnosis not present

## 2020-09-02 DIAGNOSIS — Z933 Colostomy status: Secondary | ICD-10-CM | POA: Diagnosis not present

## 2020-09-02 NOTE — Telephone Encounter (Signed)
Scheduled appointment per 9/20 los. Spoke with patient in person, patient is aware of upcoming appointments. Gave patient calendar print out.

## 2020-09-03 ENCOUNTER — Telehealth: Payer: Self-pay | Admitting: Pulmonary Disease

## 2020-09-03 DIAGNOSIS — Z933 Colostomy status: Secondary | ICD-10-CM | POA: Diagnosis not present

## 2020-09-03 MED ORDER — ALBUTEROL SULFATE HFA 108 (90 BASE) MCG/ACT IN AERS: 2.0000 | INHALATION_SPRAY | Freq: Four times a day (QID) | RESPIRATORY_TRACT | 5 refills | Status: DC | PRN

## 2020-09-03 MED ORDER — TRELEGY ELLIPTA 100-62.5-25 MCG/INH IN AEPB: 1.0000 | INHALATION_SPRAY | Freq: Every day | RESPIRATORY_TRACT | 5 refills | Status: DC

## 2020-09-03 NOTE — Telephone Encounter (Signed)
Assessment & Plan:   COPD, severe ([place]) Very severe COPD with a recent hospitalization for acute respiratory failure. Patient needs to return back to her maintenance therapy.  Would discontinue theophylline.  Restart Trelegy inhaler as she has this at home. Use her albuterol inhaler or nebulizer as needed  Plan  Patient Instructions  Stop Theophylline and Pulmicort .  Restart TRELEGY 1 puff daily  Restart Allegra 180mg  daily  Restart Flonase Nasal 2 puff daily .  Mucinex DM Twice daily  As needed  Cough/congestion .  Follow up with Oncology as planned.  Chest xray today .  Follow up with Dr. Valeta Harms in 4 weeks and As needed   Please contact office for sooner follow up if symptoms do not improve or worsen or seek emergency care   Rx for Trelegy and albuterol inhaler have been sent to preferred pharmacy for pt. Called and spoke with pt letting her know that this had been done and she verbalized understanding. Nothing further needed.

## 2020-09-04 DIAGNOSIS — Z933 Colostomy status: Secondary | ICD-10-CM | POA: Diagnosis not present

## 2020-09-05 DIAGNOSIS — Z933 Colostomy status: Secondary | ICD-10-CM | POA: Diagnosis not present

## 2020-09-06 ENCOUNTER — Telehealth: Payer: Self-pay

## 2020-09-06 ENCOUNTER — Other Ambulatory Visit: Payer: Self-pay

## 2020-09-06 DIAGNOSIS — Z933 Colostomy status: Secondary | ICD-10-CM | POA: Diagnosis not present

## 2020-09-06 DIAGNOSIS — C20 Malignant neoplasm of rectum: Secondary | ICD-10-CM

## 2020-09-06 NOTE — Telephone Encounter (Signed)
I left vm for Tracy [name] to call me regarding missed gardant reveal test at her last visit.

## 2020-09-07 DIAGNOSIS — Z933 Colostomy status: Secondary | ICD-10-CM | POA: Diagnosis not present

## 2020-09-08 DIAGNOSIS — Z933 Colostomy status: Secondary | ICD-10-CM | POA: Diagnosis not present

## 2020-09-10 ENCOUNTER — Inpatient Hospital Stay: Payer: Medicare HMO

## 2020-09-10 ENCOUNTER — Other Ambulatory Visit: Payer: Self-pay

## 2020-09-10 DIAGNOSIS — C20 Malignant neoplasm of rectum: Secondary | ICD-10-CM

## 2020-09-12 ENCOUNTER — Telehealth: Payer: Self-pay

## 2020-09-12 NOTE — Telephone Encounter (Signed)
Guardant Reveal results returned per Dr. Burr Medico no circulating tumor DNA in blood patient made aware

## 2020-09-19 DIAGNOSIS — C20 Malignant neoplasm of rectum: Secondary | ICD-10-CM | POA: Diagnosis not present

## 2020-09-19 DIAGNOSIS — R1310 Dysphagia, unspecified: Secondary | ICD-10-CM | POA: Diagnosis not present

## 2020-09-19 DIAGNOSIS — J449 Chronic obstructive pulmonary disease, unspecified: Secondary | ICD-10-CM | POA: Diagnosis not present

## 2020-09-19 DIAGNOSIS — R69 Illness, unspecified: Secondary | ICD-10-CM | POA: Diagnosis not present

## 2020-09-19 DIAGNOSIS — J9600 Acute respiratory failure, unspecified whether with hypoxia or hypercapnia: Secondary | ICD-10-CM | POA: Diagnosis not present

## 2020-09-19 DIAGNOSIS — I82621 Acute embolism and thrombosis of deep veins of right upper extremity: Secondary | ICD-10-CM | POA: Diagnosis not present

## 2020-09-19 DIAGNOSIS — E43 Unspecified severe protein-calorie malnutrition: Secondary | ICD-10-CM | POA: Diagnosis not present

## 2020-09-19 DIAGNOSIS — J90 Pleural effusion, not elsewhere classified: Secondary | ICD-10-CM | POA: Diagnosis not present

## 2020-09-19 DIAGNOSIS — M6281 Muscle weakness (generalized): Secondary | ICD-10-CM | POA: Diagnosis not present

## 2020-09-22 DIAGNOSIS — C20 Malignant neoplasm of rectum: Secondary | ICD-10-CM | POA: Diagnosis not present

## 2020-09-23 ENCOUNTER — Telehealth: Payer: Self-pay

## 2020-09-23 NOTE — Telephone Encounter (Signed)
Error

## 2020-09-24 ENCOUNTER — Encounter: Payer: Self-pay | Admitting: Hematology

## 2020-09-24 LAB — GUARDANT 360

## 2020-10-01 ENCOUNTER — Telehealth: Payer: Self-pay

## 2020-10-01 NOTE — Telephone Encounter (Signed)
I spoke with Ms Beske and let her know her last GuardantReveal testing did not detect any cancer dna in her blood.  She verbalized understanding.

## 2020-10-03 ENCOUNTER — Telehealth: Payer: Self-pay | Admitting: Family Medicine

## 2020-10-03 DIAGNOSIS — Z933 Colostomy status: Secondary | ICD-10-CM | POA: Diagnosis not present

## 2020-10-03 NOTE — Telephone Encounter (Signed)
Patient's husband dropped off letter and picture of rash around her stoma. They would like someone to call with advice. Information placed in Dr. Lucita Lora inbox at front desk.

## 2020-10-03 NOTE — Telephone Encounter (Signed)
Scheduled pt for OV on 10/08/20. Will place note and picture in box for review.

## 2020-10-08 ENCOUNTER — Ambulatory Visit (INDEPENDENT_AMBULATORY_CARE_PROVIDER_SITE_OTHER): Payer: Medicare HMO | Admitting: Family Medicine

## 2020-10-08 ENCOUNTER — Encounter: Payer: Self-pay | Admitting: Family Medicine

## 2020-10-08 ENCOUNTER — Other Ambulatory Visit: Payer: Self-pay

## 2020-10-08 VITALS — BP 117/75 | HR 86 | Temp 97.5°F | Ht 62.0 in | Wt 119.0 lb

## 2020-10-08 DIAGNOSIS — Z1231 Encounter for screening mammogram for malignant neoplasm of breast: Secondary | ICD-10-CM | POA: Diagnosis not present

## 2020-10-08 DIAGNOSIS — Z23 Encounter for immunization: Secondary | ICD-10-CM

## 2020-10-08 DIAGNOSIS — L24B3 Irritant contact dermatitis related to fecal or urinary stoma or fistula: Secondary | ICD-10-CM

## 2020-10-08 DIAGNOSIS — L929 Granulomatous disorder of the skin and subcutaneous tissue, unspecified: Secondary | ICD-10-CM | POA: Diagnosis not present

## 2020-10-08 DIAGNOSIS — Z933 Colostomy status: Secondary | ICD-10-CM

## 2020-10-08 NOTE — Progress Notes (Signed)
This visit occurred during the SARS-CoV-2 public health emergency.  Safety protocols were in place, including screening questions prior to the visit, additional usage of staff PPE, and extensive cleaning of exam room while observing appropriate contact time as indicated for disinfecting solutions.    Tracy [name] , [birth year], 69 y.o., female MRN: [id number] Patient Care Team    Relationship Specialty Notifications Start End  [name], DO PCP - General Family Medicine  05/21/20   Jonnie Finner, RN Oncology Nurse Navigator  All results, Admissions 05/07/20    Comment: Phone:  [phone]  Fax:  [phone]  [name], Gideon Physician Hematology All results, Admissions 05/07/20   [name], DO Consulting Physician Pulmonary Disease  05/27/20   [facility]    05/27/20   [name], MD Consulting Physician General Surgery  06/11/35   [name], MD Consulting Physician General Surgery  05/27/20     Chief Complaint  Patient presents with  . Rash    pt c/o rash on stomach x 3 wk; pt states that it is red and itching     Subjective: Pt presents for an OV with complaints of rash surrounding stoma of 3 weeks duration.  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  Her pathology showed invasive cancer, metastatic to 3/15 lymph nodes.  Her stoma at that time had become necrotic and she underwent second bowel surgery on 03/12/2020.  Overall, she has recovered well from her surgical procedures.  She has followed closely with her oncology team.  Associated symptoms include red and itchy rash. Her husband tends to her stoma care and has a regimen of every 3 days, after shower they dry stoma w/ towel, remove old adhesive with Brava adhesive removal-allow to  dry, Apply baza clear skin protectant moisture barrier ointment (which they feel can seep and sometimes and does not dry well), sometimes use a light dusting of brava stoma powder, then apply Brava moldable ring and sensuramio ostomy pouch(cut to fit).They have also been using Cavilon barrier spray. They have noticed improvement in rash over the last 3 days. They report he has not been placing the barrier ointment and using spray only the last 3 days. No fever, chills or nausea associated.   Depression screen [facility] 2/9 10/08/2020 07/02/2020  Decreased Interest 0 0  Down, Depressed, Hopeless 0 0  PHQ - 2 Score 0 0  Altered sleeping - 0  Tired, decreased energy - 0  Change in appetite - 0  Feeling bad or failure about yourself  - 0  Trouble concentrating - 0  Moving slowly or fidgety/restless - 0  Suicidal thoughts - 0  PHQ-9 Score - 0  Difficult doing work/chores - Not difficult at all    Allergies  Allergen Reactions  . Amlodipine     Leg swelling.   . Codeine Swelling   Social History   Social History Narrative   Marital status/children/pets: Married.  2 children.   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   Safety:      -smoke alarm in the home:Yes     - wears seatbelt: Yes     - Feels safe in their relationships: Yes   Past Medical History:  Diagnosis Date  . Acute deep vein thrombosis (DVT) of brachial vein of right upper  extremity ([place])   . Acute on chronic respiratory failure with hypoxia ([place])   . Acute respiratory failure with hypoxemia ([place])   . Asthma   . Cancer ([place])   . CH[place], HX OF 01/06/2011   Qualifier: Diagnosis of  By: Charlett Blake MD, Erline Levine    . COPD (chronic obstructive pulmonary disease) ([place]) 2011   FeV1 31% predicted FeV1/FVX 47 %  . COPD, severe ([place])   . Essential hypertension 05/23/2020  . Hemorrhoid   . Open right radial fracture 2020  . Osteopenia   . Perforated sigmoid colon ([place]) 03/03/2020  . Pleural effusion   . Pneumonia 2021  .  Postoperative intra-abdominal abscess 2021  . Seasonal allergies    takes Claritin daily prn  . Shock circulatory ([place])   . Tracheostomy status ([place])   . Vitamin D deficiency    takes Vit d every 14 days   Past Surgical History:  Procedure Laterality Date  . AUGMENTATION MAMMAPLASTY     saline  . EXAMINATION UNDER ANESTHESIA  10/14/2012   Procedure: EXAM UNDER ANESTHESIA;  Surgeon: Gayland Curry, MD,FACS;  Location: [place];  Service: General;  Laterality: N/A;  rectal exam under anesthesia excisional hemorrhoidectomy hemorrhoidal banding x two  . EXAMINATION UNDER ANESTHESIA  02/03/2013   excision hemorrhoidal tissue  . FOOT SURGERY     left bunionectomy  . HEMORRHOIDECTOMY WITH HEMORRHOID BANDING  10/14/2012   Procedure: HEMORRHOIDECTOMY WITH HEMORRHOID BANDING;  Surgeon: Gayland Curry, MD,FACS;  Location: [place];  Service: General;  Laterality: N/A;  . IR THORACENTESIS ASP PLEURAL SPACE W/IMG GUIDE  04/23/2020  . LAPAROTOMY N/A 03/03/2020   Procedure: low anterior resection end colostomy;  Surgeon: [name], MD;  Location: [facility];  Service: General;  Laterality: N/A;  . LAPAROTOMY N/A 03/12/2020   Procedure: EXPLORATORY LAPAROTOMY WITH BOWEL RESECTION AND COLOSTOMY;  Surgeon: [name], MD;  Location: [facility];  Service: General;  Laterality: N/A;  . OPEN REDUCTION INTERNAL FIXATION (ORIF) DISTAL RADIAL FRACTURE Right 11/01/2018   Procedure: OPEN REDUCTION INTERNAL FIXATION (ORIF) DISTAL RADIAL FRACTURE;  Surgeon: [name], MD;  Location: [place];  Service: Orthopedics;  Laterality: Right;  . TONSILLECTOMY  1960   recurrent otitis media  . US ECHOCARDIOGRAPHY  02/2020    poor windows, normal LV function, severely dilated RV with moderately reduced function, RV volume and pressure overload, mildly dilated RA   Family History  Problem Relation Age of Onset  . Osteoporosis Mother   . Other Father        [place] in snow, broke ankle, blood clot to lungs  .  Heart murmur Sister   . Cancer Sister        leukemia   . Other Brother        h/o being hit by lightening  . Parkinsonism Brother   . Asthma Son   . Allergies Son   . Otitis media Son   . Other Sister        CML  . Asthma Son   . Allergies Son   . Pancreatic cancer Brother 4       diag December 15, 2013-deceased February 14, 2014   Allergies as of 10/08/2020      Reactions   Amlodipine    Leg swelling.    Codeine Swelling      Medication List       Accurate as of October 08, 2020 11:59 PM. If you have any questions, ask your nurse or doctor.  STOP taking these medications   azithromycin 500 MG tablet Commonly known as: ZITHROMAX Stopped by: Howard Pouch, DO   predniSONE 50 MG tablet Commonly known as: DELTASONE Stopped by: Howard Pouch, DO     TAKE these medications   albuterol (2.5 MG/3ML) 0.083% nebulizer solution Commonly known as: PROVENTIL Take by nebulization.   albuterol 108 (90 Base) MCG/ACT inhaler Commonly known as: VENTOLIN HFA Inhale 2 puffs into the lungs every 6 (six) hours as needed for wheezing or shortness of breath.   ALPRAZolam 0.25 MG tablet Commonly known as: XANAX Take 1 tablet (0.25 mg total) by mouth at bedtime as needed for anxiety.   apixaban 5 MG Tabs tablet Commonly known as: ELIQUIS Take 1 tablet (5 mg total) by mouth 2 (two) times daily.   docusate sodium 50 MG capsule Commonly known as: COLACE Take 50 mg by mouth daily as needed for mild constipation.   fexofenadine 180 MG tablet Commonly known as: ALLEGRA Take 1 tablet (180 mg total) by mouth daily.   fluticasone 50 MCG/ACT nasal spray Commonly known as: FLONASE Place 2 sprays into both nostrils daily.   ipratropium-albuterol 0.5-2.5 (3) MG/3ML Soln Commonly known as: DUONEB Take 3 mLs by nebulization every 4 (four) hours as needed.   Trelegy Ellipta 100-62.5-25 MCG/INH Aepb Generic drug: Fluticasone-Umeclidin-Vilant Inhale 1 puff into the lungs daily.       All past  medical history, surgical history, allergies, family history, immunizations andmedications were updated in the EMR today and reviewed under the history and medication portions of their EMR.     ROS: Negative, with the exception of above mentioned in HPI   Objective:  BP 117/75   Pulse 86   Temp (!) 97.5 F (36.4 C) (Oral)   Ht 5\' 2"  (1.575 m)   Wt 119 lb (54 kg)   LMP 12/14/2000   SpO2 95%   BMI 21.77 kg/m  Body mass index is 21.77 kg/m. Gen: Afebrile. No acute distress. Nontoxic in appearance, well developed, well nourished.  HENT: AT. [place].  Eyes:Pupils Equal Round Reactive to light, Extraocular movements intact,  Conjunctiva without redness, discharge or icterus. Abd/skin: Soft.  Ventral hernia.  NTND. BS present.  Removal of colostomy bag performed today-stoma with in normal limits pink, moist no signs of infection necrosis.  Area surrounding stoma without erythema.  Multiple sites of granulation tissue with mild serosanguineous oozing after cleansing. Neuro: Normal gait. PERLA. EOMi. Alert. Oriented x3  Psych: Normal affect, dress and demeanor. Normal speech. Normal thought content and judgment.  No exam data present No results found. No results found for this or any previous visit (from the past 24 hour(s)).  Assessment/Plan: LURIA ROSARIO is a 69 y.o. female present for OV for  Colostomy status (HCC)/Irritant contact dermatitis associated with fecal stoma/Granulation tissue Stoma appears healthy today.  Area around stoma appears to be healing from a dermatitis.  There does not appear to be a skin infection in this area at this time.  However there is multiple sites of granulation tissue that will need care in order to prevent infection. -Discussed with patient and her husband today referral for granulation tissue surrounding her stoma.  We contacted wound clinic which stated they would not treat ostomy and recommended GI.  Currently patient does not have GI establishment, but  will need a colonoscopy per her oncology teams recommendations, therefore refer to GI today. - Ambulatory referral to Gastroenterology-urgently> if GI unable to care for granulation tissue will attempt to get  her back into see her surgeon-urgently. -In the meantime we discussed not using the barrier ointment since there seems to be some improvement since discontinuing that 3 days ago.  Continuing with the barrier spray only. -Close monitoring of wound and if it becomes more red, swollen, fever or appreciates drainage needs to be seen emergently.  They report understanding.  Need for influenza vaccination - Flu Vaccine QUAD High Dose(Fluad)  Breast cancer screening by mammogram - MM 3D SCREEN BREAST BILATERAL; Future   Reviewed expectations re: course of current medical issues.  Discussed self-management of symptoms.  Outlined signs and symptoms indicating need for more acute intervention.  Patient verbalized understanding and all questions were answered.  Patient received an After-Visit Summary.    Orders Placed This Encounter  Procedures  . MM 3D SCREEN BREAST BILATERAL  . Flu Vaccine QUAD High Dose(Fluad)  . Ambulatory referral to Gastroenterology   No orders of the defined types were placed in this encounter.   Referral Orders     Ambulatory referral to Gastroenterology   Note is dictated utilizing voice recognition software. Although note has been proof read prior to signing, occasional typographical errors still can be missed. If any questions arise, please do not hesitate to call for verification.   electronically signed by:  Howard Pouch, DO  [place]

## 2020-10-08 NOTE — Patient Instructions (Signed)
Use the Cavilon barrier spray- avoid ointment for now since there is improvement without.   We will call you once we get wound care arranged and discuss where/who at that time. I want to look into a few options.    The area does not looked infected currently. The granulation tissue will need monitored and possible silver nitrate application.

## 2020-10-15 ENCOUNTER — Telehealth: Payer: Self-pay

## 2020-10-15 NOTE — Telephone Encounter (Signed)
noted 

## 2020-10-15 NOTE — Telephone Encounter (Signed)
Spoke with [facility] to determine status of referral placed on 10/08/2020.  Confirmed providers evaluate/treat stoma issues.  Office to contact patient to schedule appointment.

## 2020-10-17 ENCOUNTER — Encounter: Payer: Self-pay | Admitting: Physician Assistant

## 2020-10-18 ENCOUNTER — Telehealth: Payer: Self-pay

## 2020-10-18 NOTE — Telephone Encounter (Signed)
Wanted to let Dr. Raoul Pitch know that her rash has healed completely. No need for referral.

## 2020-10-18 NOTE — Telephone Encounter (Signed)
Noted. Very glad to hear it has healed. GI referral was placed and she should still establish since she will need a colonoscopy

## 2020-10-18 NOTE — Telephone Encounter (Signed)
fyi

## 2020-10-20 DIAGNOSIS — J9600 Acute respiratory failure, unspecified whether with hypoxia or hypercapnia: Secondary | ICD-10-CM | POA: Diagnosis not present

## 2020-10-20 DIAGNOSIS — I82621 Acute embolism and thrombosis of deep veins of right upper extremity: Secondary | ICD-10-CM | POA: Diagnosis not present

## 2020-10-20 DIAGNOSIS — J90 Pleural effusion, not elsewhere classified: Secondary | ICD-10-CM | POA: Diagnosis not present

## 2020-10-20 DIAGNOSIS — M6281 Muscle weakness (generalized): Secondary | ICD-10-CM | POA: Diagnosis not present

## 2020-10-20 DIAGNOSIS — R1310 Dysphagia, unspecified: Secondary | ICD-10-CM | POA: Diagnosis not present

## 2020-10-20 DIAGNOSIS — C20 Malignant neoplasm of rectum: Secondary | ICD-10-CM | POA: Diagnosis not present

## 2020-10-20 DIAGNOSIS — R69 Illness, unspecified: Secondary | ICD-10-CM | POA: Diagnosis not present

## 2020-10-20 DIAGNOSIS — J449 Chronic obstructive pulmonary disease, unspecified: Secondary | ICD-10-CM | POA: Diagnosis not present

## 2020-10-20 DIAGNOSIS — E43 Unspecified severe protein-calorie malnutrition: Secondary | ICD-10-CM | POA: Diagnosis not present

## 2020-10-21 DIAGNOSIS — R69 Illness, unspecified: Secondary | ICD-10-CM | POA: Diagnosis not present

## 2020-10-29 ENCOUNTER — Telehealth: Payer: Self-pay | Admitting: Family Medicine

## 2020-10-29 NOTE — Telephone Encounter (Signed)
Please advise 

## 2020-10-29 NOTE — Telephone Encounter (Signed)
Patient and her husband presented last month with a rash around her colostomy.  We had called wound care which did not feel this is an area they would treat.  We referred her to gastroenterology at that time who stated this is something they would treat.  I have they established with gastroenterology?  They were supposed to be worked in quickly.  Please check on this referral as this is who she would be following with for this condition.  Thanks

## 2020-10-29 NOTE — Telephone Encounter (Signed)
Pt has a sched appt with GI Thursday and is aware that they will handle the rash

## 2020-10-29 NOTE — Telephone Encounter (Signed)
Patient's husband called to say the rash on her stomach is back. They would like to get that referral for wound care.

## 2020-10-30 ENCOUNTER — Telehealth: Payer: Self-pay | Admitting: Physician Assistant

## 2020-10-31 ENCOUNTER — Ambulatory Visit: Payer: Medicare HMO | Admitting: Physician Assistant

## 2020-10-31 DIAGNOSIS — Z933 Colostomy status: Secondary | ICD-10-CM | POA: Diagnosis not present

## 2020-11-01 DIAGNOSIS — Z933 Colostomy status: Secondary | ICD-10-CM | POA: Diagnosis not present

## 2020-11-02 DIAGNOSIS — Z933 Colostomy status: Secondary | ICD-10-CM | POA: Diagnosis not present

## 2020-11-03 DIAGNOSIS — Z933 Colostomy status: Secondary | ICD-10-CM | POA: Diagnosis not present

## 2020-11-04 ENCOUNTER — Encounter: Payer: Self-pay | Admitting: Family Medicine

## 2020-11-04 DIAGNOSIS — Z933 Colostomy status: Secondary | ICD-10-CM | POA: Diagnosis not present

## 2020-11-05 DIAGNOSIS — Z933 Colostomy status: Secondary | ICD-10-CM | POA: Diagnosis not present

## 2020-11-06 DIAGNOSIS — Z933 Colostomy status: Secondary | ICD-10-CM | POA: Diagnosis not present

## 2020-11-07 DIAGNOSIS — Z933 Colostomy status: Secondary | ICD-10-CM | POA: Diagnosis not present

## 2020-11-08 DIAGNOSIS — Z933 Colostomy status: Secondary | ICD-10-CM | POA: Diagnosis not present

## 2020-11-09 DIAGNOSIS — Z933 Colostomy status: Secondary | ICD-10-CM | POA: Diagnosis not present

## 2020-11-19 ENCOUNTER — Ambulatory Visit: Payer: Medicare HMO | Admitting: Gastroenterology

## 2020-11-19 DIAGNOSIS — J9600 Acute respiratory failure, unspecified whether with hypoxia or hypercapnia: Secondary | ICD-10-CM | POA: Diagnosis not present

## 2020-11-19 DIAGNOSIS — C20 Malignant neoplasm of rectum: Secondary | ICD-10-CM | POA: Diagnosis not present

## 2020-11-19 DIAGNOSIS — I82621 Acute embolism and thrombosis of deep veins of right upper extremity: Secondary | ICD-10-CM | POA: Diagnosis not present

## 2020-11-19 DIAGNOSIS — R1310 Dysphagia, unspecified: Secondary | ICD-10-CM | POA: Diagnosis not present

## 2020-11-19 DIAGNOSIS — R69 Illness, unspecified: Secondary | ICD-10-CM | POA: Diagnosis not present

## 2020-11-19 DIAGNOSIS — J90 Pleural effusion, not elsewhere classified: Secondary | ICD-10-CM | POA: Diagnosis not present

## 2020-11-19 DIAGNOSIS — J449 Chronic obstructive pulmonary disease, unspecified: Secondary | ICD-10-CM | POA: Diagnosis not present

## 2020-11-19 DIAGNOSIS — E43 Unspecified severe protein-calorie malnutrition: Secondary | ICD-10-CM | POA: Diagnosis not present

## 2020-11-19 DIAGNOSIS — M6281 Muscle weakness (generalized): Secondary | ICD-10-CM | POA: Diagnosis not present

## 2020-11-20 NOTE — Telephone Encounter (Signed)
No additional notes needed  

## 2020-11-22 NOTE — Progress Notes (Signed)
[place]   [phone] [phone]   Clinic Follow up Note   Patient Care Team: [name], DO as PCP - General (Family Medicine) Jonnie Finner, RN as Oncology Nurse Navigator [name], MD as Consulting Physician (Hematology) Icard, Octavio Graves, DO as Consulting Physician (Pulmonary Disease) [facility] [name], MD as Consulting Physician (General Surgery) [name], MD as Consulting Physician (General Surgery)  Date of Service:  11/25/2020  CHIEF COMPLAINT: F/u of rectal cancer  SUMMARY OF ONCOLOGIC HISTORY: Oncology History  Rectal cancer ([place])  03/03/2020 - 04/05/2020 Hospital Admission   She was admitted to ED on 03/03/20 for abdominal pain and nausea. She had been having diarrhea for several weeks. During hospital stay she developed acute respiratory failure, AKI, RUE DVT from PICC line. Work up showed bowel perforation, small left liver mass and thickening of jejunum. She underwent emergent surgery on 03/03/20 for resection and colostomy placement. Her path showed invasive cancer, metastatic to 3/15 LNs. She had a NGT in placed but this was removed on POD 3. Post op her stoma became necrotic and septic. She had another bowel surgery on 03/12/20, which was NED with necrotic tissue.    03/03/2020 Imaging   CT AP 03/03/20  IMPRESSION: 1. Free intraperitoneal air consistent perforation. Most likely site of perforation is in the distal splenic flexure/proximal descending colon where there is a collection of air and gas measuring 6 centimeters. No obvious soft tissue mass identified in this region. At this site, there is abrupt transition of dilated, stool-filled colon to completely decompressed proximal descending colon. 2. Thickened, inflamed loops of jejunum are identified within the pelvis and are likely reactive. 3. Small hiatal hernia. 4. Benign-appearing 1.6 centimeter mass the LEFT hepatic  lobe. Recommend comparison with prior studies if available. 5.  Emphysema (ICD10-J43.9). 6.  Aortic Atherosclerosis (ICD10-I70.0). 7. Bilateral renal scarring.   03/03/2020 Surgery   low anterior resection end colostomy by Dr Marcello Moores    03/03/2020 Initial Biopsy   FINAL MICROSCOPIC DIAGNOSIS: 03/03/20 A. RECTOSIGMOID COLON, LOW ANTERIOR RESECTION:  - Invasive colonic adenocarcinoma, 3.5 cm.  - Tumor invades the visceral peritoneum.  - Margins of resection are not involved.  - Metastatic carcinoma in (3) of (15) lymph nodes.  - See oncology table.   B. ADDITIONAL SIGMOID COLON, RESECTION:  - Colonic tissue, negative for carcinoma.  ADDENDUM:  Mismatch Repair Protein (IHC)   SUMMARY INTERPRETATION: NORMAL  There is preserved expression of the major MMR proteins. There is a very  low probability that microsatellite instability (MSI) is present.  However, certain clinically significant MMR protein mutations may result  in preservation of nuclear expression. It is recommended that the  preservation of protein expression be correlated with molecular based  MSI testing.   IHC EXPRESSION RESULTS  TEST           RESULT  MLH1:          Preserved nuclear expression  MSH2:          Preserved nuclear expression  MSH6:          Preserved nuclear expression  PMS2:          Preserved nuclear expression   03/08/2020 Imaging   CT AP 03/08/20 IMPRESSION: 1. Interval midline laparotomy with distal colon resection and diverting left lower quadrant colostomy. 2. Diffuse small bowel dilatation with gas fluid levels most consistent with postoperative ileus. 3. Trace ascites within the abdomen and pelvis. No fluid  collection or abscess at this time. Surgical drain within the lower pelvis. 4. Indeterminate 1.4 cm subcapsular liver hypodensity. In light of newly diagnosed rectal cancer, metastatic disease cannot be excluded. PET CT may be useful for further evaluation. 5. Interval development of  trace bilateral pleural effusions and diffuse body wall edema.   03/12/2020 Surgery   EXPLORATORY LAPAROTOMY WITH BOWEL RESECTION AND COLOSTOMY by Dr Hassell Done 03/12/20  FINAL MICROSCOPIC DIAGNOSIS: 03/12/20  A. COLON, SPLENIC FLEXURE, RESECTION:  - Segment of colon (37 cm) with perforation and associated inflammation  - Multiple mucosal ulcers with necrotizing inflammation  - No evidence of malignancy    03/12/2020 Imaging   CT AP WO contrast 03/12/20  IMPRESSION: 1. Exam is limited by lack of intravenous contrast, motion, and artifact from the patient's arms adjacent to the torso. Since 03/08/2020, there has been development of relatively large pockets of intraperitoneal free air. While intraperitoneal gas would not be unexpected on postoperative day 9, this gas is new since an intervening study of 03/08/2020 and given the relatively large volume certainly raises concern for bowel perforation. No source for the intraperitoneal free air is evident on this study. There is a surgical drain in the pelvis, but there is no free gas around the drain itself to suggest that it represents the source. 2. New circumferential wall thickening in the splenic flexure, descending colon and sigmoid colon leading into the end colostomy. Infection/inflammation would be a consideration. Ischemia cannot be excluded. 3. Relatively small volume intraperitoneal free fluid. High attenuation small fluid collections in the left upper abdomen may reflect hemorrhage, infection or residua from prior perforation. 4. Interval progression of diffuse body wall edema. 5. Residual contrast material in the renal parenchyma from prior imaging, compatible with renal dysfunction.    03/19/2020 Imaging   CT CAP 03/19/20  IMPRESSION: 1. 5.5 x 3.2 cm fluid collection is noted along the greater curvature of the proximal stomach. 2. Surgical drain is again noted in the pelvis with tip in left lower quadrant. 3. Interval  development of crescent-shaped fluid collection measuring 16.4 x 3.3 cm in the epigastric region and left upper quadrant of the abdomen which may extend into the left lower quadrant. Potentially this may represent abscess or developing abscess. Multiple other smaller fluid collections are noted which may represent small abscesses. 4. 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 5. Mild amount of free fluid is noted in the posterior pelvis. 6. Moderate anasarca is noted. Aortic Atherosclerosis (ICD10-I70.0).   04/12/2020 Imaging   CT AP 04/12/20  IMPRESSION: 1. Fluid collections within the LEFT abdomen have significantly decreased compared to previous CT exams, now nearly completely resolved. 2. Percutaneous drainage catheter with tip coiled posterior to the LEFT kidney, stable positioning compared to the previous study. The more anterior catheter has been removed. 3. Small amount of free fluid persists within the abdomen and pelvis. 4. Trace bibasilar pleural effusions. 5. Anasarca. 6. While reviewing today's study, comparing with a chest/abdomen/pelvis CT from earlier same month, there is question of thrombus in the LEFT internal jugular vein. Recommend ultrasound of the LEFT IJ to exclude DVT. This recommendation discussed with patient's hospitalist, Dr. Owens Shark, on 04/12/2020 at 4:20 p.m.   Emphysema (ICD10-J43.9).   04/22/2020 Imaging   CT AP 04/22/20  IMPRESSION: 1. No recurrent intra-abdominal abscess status post interval removal of left retroperitoneal percutaneous drain. Stable small amount of free pelvic fluid. 2. Enlarging dependent bilateral pleural effusions, now moderate in volume. Associated atelectasis at both lung  bases. 3. Progressive anasarca with generalized edema throughout the subcutaneous fat. 4. Stable small subcapsular fluid collection along the anterior aspect of the left hepatic lobe. 5. Aortic Atherosclerosis (ICD10-I70.0).   05/23/2020 Initial  Diagnosis   Rectal cancer ([place])      CURRENT THERAPY:  Surveillance  INTERVAL HISTORY:  Tracy [name] is here for a follow up. She presents to the clinic with her husband. She notes she is doing well. She notes she has finally recovered well from Surgery in March. She has been able to gain her weight back. She denies any stomach issues with pain, bloating or nausea. She notes her BM are consistent. She will have BM once a day with soft stool. She denies black or blood in stool. She plans to have colonoscopy soon. She notes she plans to set up mammogram soon as well. Her husband notes her stomach is more rounded lately. She notes to having LLQ hernia. This does not cause her pain. She notes recently having infection around her ostomy site. She also often has skin irration at ostomy site from adhesive.    REVIEW OF SYSTEMS:   Constitutional: Denies fevers, chills or abnormal weight loss Eyes: Denies blurriness of vision Ears, nose, mouth, throat, and face: Denies mucositis or sore throat Respiratory: Denies cough, dyspnea or wheezes Cardiovascular: Denies palpitation, chest discomfort or lower extremity swelling Gastrointestinal:  Denies nausea, heartburn or change in bowel habits Skin: Denies abnormal skin rashes Lymphatics: Denies new lymphadenopathy or easy bruising Neurological:Denies numbness, tingling or new weaknesses Behavioral/Psych: Mood is stable, no new changes  All other systems were reviewed with the patient and are negative.  MEDICAL HISTORY:  Past Medical History:  Diagnosis Date  . Acute deep vein thrombosis (DVT) of brachial vein of right upper extremity ([place])   . Acute on chronic respiratory failure with hypoxia ([place])   . Acute respiratory failure with hypoxemia ([place])   . Asthma   . Cancer ([place])   . CHICKENPO[place], HX OF 01/06/2011   Qualifier: Diagnosis of  By: Charlett Blake MD, Erline Levine    . COPD (chronic obstructive pulmonary disease) ([place]) 2011   FeV1 31% predicted  FeV1/FVX 47 %  . COPD, severe ([place])   . Essential hypertension 05/23/2020  . Hemorrhoid   . Open right radial fracture 2020  . Osteopenia   . Perforated sigmoid colon ([employer]) 03/03/2020  . Pleural effusion   . Pneumonia 2021  . Postoperative intra-abdominal abscess 2021  . Seasonal allergies    takes Claritin daily prn  . Shock circulatory ([place])   . Tracheostomy status ([place])   . Vitamin D deficiency    takes Vit d every 14 days    SURGICAL HISTORY: Past Surgical History:  Procedure Laterality Date  . AUGMENTATION MAMMAPLASTY     saline  . EXAMINATION UNDER ANESTHESIA  10/14/2012   Procedure: EXAM UNDER ANESTHESIA;  Surgeon: Gayland Curry, MD,FACS;  Location: [facility];  Service: General;  Laterality: N/A;  rectal exam under anesthesia excisional hemorrhoidectomy hemorrhoidal banding x two  . EXAMINATION UNDER ANESTHESIA  02/03/2013   excision hemorrhoidal tissue  . FOOT SURGERY     left bunionectomy  . HEMORRHOIDECTOMY WITH HEMORRHOID BANDING  10/14/2012   Procedure: HEMORRHOIDECTOMY WITH HEMORRHOID BANDING;  Surgeon: Gayland Curry, MD,FACS;  Location: [facility];  Service: General;  Laterality: N/A;  . IR THORACENTESIS ASP PLEURAL SPACE W/IMG GUIDE  04/23/2020  . LAPAROTOMY N/A 03/03/2020   Procedure: low anterior resection end colostomy;  Surgeon: [name],  MD;  Location: [facility];  Service: General;  Laterality: N/A;  . LAPAROTOMY N/A 03/12/2020   Procedure: EXPLORATORY LAPAROTOMY WITH BOWEL RESECTION AND COLOSTOMY;  Surgeon: [name], MD;  Location: [facility];  Service: General;  Laterality: N/A;  . OPEN REDUCTION INTERNAL FIXATION (ORIF) DISTAL RADIAL FRACTURE Right 11/01/2018   Procedure: OPEN REDUCTION INTERNAL FIXATION (ORIF) DISTAL RADIAL FRACTURE;  Surgeon: [name], MD;  Location: [facility];  Service: Orthopedics;  Laterality: Right;  . TONSILLECTOMY  1960   recurrent otitis media  . US ECHOCARDIOGRAPHY  02/2020    poor windows, normal LV function,  severely dilated RV with moderately reduced function, RV volume and pressure overload, mildly dilated RA    I have reviewed the social history and family history with the patient and they are unchanged from previous note.  ALLERGIES:  is allergic to amlodipine and codeine.  MEDICATIONS:  Current Outpatient Medications  Medication Sig Dispense Refill  . albuterol (PROVENTIL) (2.5 MG/3ML) 0.083% nebulizer solution Take by nebulization.    Marland Kitchen albuterol (VENTOLIN HFA) 108 (90 Base) MCG/ACT inhaler Inhale 2 puffs into the lungs every 6 (six) hours as needed for wheezing or shortness of breath. 8 g 5  . ALPRAZolam (XANAX) 0.25 MG tablet Take 1 tablet (0.25 mg total) by mouth at bedtime as needed for anxiety. 30 tablet 0  . diphenhydrAMINE (BENADRYL) 25 mg capsule Take 25 mg by mouth every 6 (six) hours as needed.    . docusate sodium (COLACE) 50 MG capsule Take 50 mg by mouth daily as needed for mild constipation.    . fluticasone (FLONASE) 50 MCG/ACT nasal spray Place 2 sprays into both nostrils daily. 16 mL 5  . Fluticasone-Umeclidin-Vilant (TRELEGY ELLIPTA) 100-62.5-25 MCG/INH AEPB Inhale 1 puff into the lungs daily. 60 each 5  . apixaban (ELIQUIS) 5 MG TABS tablet Take 1 tablet (5 mg total) by mouth 2 (two) times daily. (Patient not taking: No sig reported) 180 tablet 1  . fexofenadine (ALLEGRA) 180 MG tablet Take 1 tablet (180 mg total) by mouth daily. (Patient not taking: Reported on 11/25/2020) 30 tablet 5  . ipratropium-albuterol (DUONEB) 0.5-2.5 (3) MG/3ML SOLN Take 3 mLs by nebulization every 4 (four) hours as needed. (Patient not taking: Reported on 11/25/2020) 360 mL    No current facility-administered medications for this visit.    PHYSICAL EXAMINATION: ECOG PERFORMANCE STATUS: 1 - Symptomatic but completely ambulatory  Vitals:   11/25/20 0955  BP: (!) 154/73  Pulse: 78  Resp: 13  Temp: 98.1 F (36.7 C)  SpO2: 100%   Filed Weights   11/25/20 0955  Weight: 123 lb 9.6 oz  (56.1 kg)    GENERAL:alert, no distress and comfortable SKIN: skin color, texture, turgor are normal, no rashes or significant lesions EYES: normal, Conjunctiva are pink and non-injected, sclera clear  NECK: supple, thyroid normal size, non-tender, without nodularity LYMPH:  no palpable lymphadenopathy in the cervical, axillary  LUNGS: clear to auscultation and percussion with normal breathing effort HEART: regular rate & rhythm and no murmurs and no lower extremity edema ABDOMEN:abdomen soft, non-tender and normal bowel sounds (+) Colostomy at left lower abdomen (+) Midline surgical incision healed well with large scar tissue (+) LLQ Abdominal hernia below ostomy site.  Musculoskeletal:no cyanosis of digits and no clubbing  NEURO: alert & oriented x 3 with fluent speech, no focal motor/sensory deficits  LABORATORY DATA:  I have reviewed the data as listed CBC Latest Ref Rng & Units 11/25/2020 08/27/2020 07/12/2020  WBC 4.0 - 10.5 K/uL 7.8 7.0 8.6  Hemoglobin 12.0 - 15.0 g/dL 13.5 14.6 12.8  Hematocrit 36.0 - 46.0 % 41.6 45.8 40.4  Platelets 150 - 400 K/uL 372 328 421(H)     CMP Latest Ref Rng & Units 11/25/2020 08/27/2020 07/12/2020  Glucose 70 - 99 mg/dL 86 90 94  BUN 8 - 23 mg/dL 12 16 9   Creatinine 0.44 - 1.00 mg/dL 1.00 0.91 0.78  Sodium 135 - 145 mmol/L 139 141 142  Potassium 3.5 - 5.1 mmol/L 4.4 4.5 3.7  Chloride 98 - 111 mmol/L 105 104 104  CO2 22 - 32 mmol/L 27 28 28   Calcium 8.9 - 10.3 mg/dL 9.6 10.0 10.1  Total Protein 6.5 - 8.1 g/dL 7.2 7.8 7.0  Total Bilirubin 0.3 - 1.2 mg/dL 0.4 0.4 0.4  Alkaline Phos 38 - 126 U/L 105 123 88  AST 15 - 41 U/L 25 23 22   ALT 0 - 44 U/L 17 17 17       RADIOGRAPHIC STUDIES: I have personally reviewed the radiological images as listed and agreed with the findings in the report. No results found.   ASSESSMENT & PLAN:  CHANIQUE DUCA is a 69 y.o. female with    1. Rectosigmoid cancer(perforated),Stage III,pT4aN1b,with  peroration,MSI Stable -She was diagnosed in 02/2020.Workup showed she had bowel perforation and underwent emergent surgery. Pathology showed invasiverectosigmoidadenocarcinoma and perforatedbowel with3/15positiveLN.Margins were clear. She haspermanentcolostomy bag in place. Her initial CT scan was negative for distant mets. -She has stage III colon cancer and with bowel perforation she hasveryhigh risk of cancer recurrence, unfortunately she was not a candidate for adjuvant chemo after surgery due to very slow recovery.  -She did opt to proceed with Guardant Reveal study in 06/06/20, 07/12/20 and 09/10/20 was negative. I discussed repeating with any concerning symptoms or in 6 months.  -She is clinically doing well. She has recovered well from surgery. She is back to her baseline weight before surgery. She denies any recent major concerns. Labs reviewed, CBC and CMP WNL. CEA and iron panel still pending. Physical exam with LLQ abdominal hernia. Given her abdominal hernia is not causing her pain, will monitor for now.   -She does have recurrent skin irritation from colostomy adhesive. I reviewed management with her.  -I encouraged her to continue to drink more water. Her Cr is in normal range.  -She is almost 1 year since her cancer diagnosis. Continue surveillance. Next Surveillance colonoscopy in early 2022. Next CT scan in 3 months.  -F/u in 3 months with scan results    2.ProvokedRUE DVT on 04/04/20. S/p 3 months of Eliquis  3. Comorbidities: Asthma, COPD, Anxiety  -On nebulizer, Cymbalta and Xanax 0.3m 1-2 daily, Benadryl and Flonase  -Continue to f/u with pulmonologist and she can request rescue inhaler from them.   4. Financial support  -She notes she is currently in a gap of her [employer] She plans to talk with insurance this week.  -I suggest she see our financial aid office and advocate to see if she is [facility]   5. Cancer Screening   -Her last Mammogram was in 2014. She plans to proceed with mammogram in early 2022.  -I encouraged her to stay up to date on age appropriate cancer screenings.    PLAN: -F/u in 3 months with lab and CT CAP w contrast a few days before.    No problem-specific Assessment & Plan notes found for this encounter.  Orders Placed This Encounter  Procedures  . CT CHEST ABDOMEN PELVIS W CONTRAST    Standing Status:   Future    Standing Expiration Date:   11/25/2021    Order Specific Question:   If indicated for the ordered procedure, I authorize the administration of contrast media per Radiology protocol    Answer:   Yes    Order Specific Question:   Preferred imaging location?    Answer:   [facility]    Order Specific Question:   Release to patient    Answer:   Immediate    Order Specific Question:   Is Oral Contrast requested for this exam?    Answer:   Yes, Per Radiology protocol    Order Specific Question:   Reason for Exam (SYMPTOM  OR DIAGNOSIS REQUIRED)    Answer:   rule out recurrence   All questions were answered. The patient knows to call the clinic with any problems, questions or concerns. No barriers to learning was detected. The total time spent in the appointment was 30 minutes.     [name], MD 11/25/2020   I, Joslyn Devon, am acting as scribe for [name], MD.   I have reviewed the above documentation for accuracy and completeness, and I agree with the above.

## 2020-11-25 ENCOUNTER — Inpatient Hospital Stay: Payer: Medicare HMO | Attending: Hematology

## 2020-11-25 ENCOUNTER — Other Ambulatory Visit: Payer: Self-pay

## 2020-11-25 ENCOUNTER — Inpatient Hospital Stay: Payer: Medicare HMO | Admitting: Hematology

## 2020-11-25 ENCOUNTER — Encounter: Payer: Self-pay | Admitting: Hematology

## 2020-11-25 VITALS — BP 154/73 | HR 78 | Temp 98.1°F | Resp 13 | Ht 62.0 in | Wt 123.6 lb

## 2020-11-25 DIAGNOSIS — Z79899 Other long term (current) drug therapy: Secondary | ICD-10-CM | POA: Diagnosis not present

## 2020-11-25 DIAGNOSIS — J449 Chronic obstructive pulmonary disease, unspecified: Secondary | ICD-10-CM | POA: Insufficient documentation

## 2020-11-25 DIAGNOSIS — Z7901 Long term (current) use of anticoagulants: Secondary | ICD-10-CM | POA: Insufficient documentation

## 2020-11-25 DIAGNOSIS — R69 Illness, unspecified: Secondary | ICD-10-CM | POA: Diagnosis not present

## 2020-11-25 DIAGNOSIS — M8580 Other specified disorders of bone density and structure, unspecified site: Secondary | ICD-10-CM | POA: Insufficient documentation

## 2020-11-25 DIAGNOSIS — C19 Malignant neoplasm of rectosigmoid junction: Secondary | ICD-10-CM | POA: Diagnosis present

## 2020-11-25 DIAGNOSIS — C20 Malignant neoplasm of rectum: Secondary | ICD-10-CM

## 2020-11-25 DIAGNOSIS — Z86718 Personal history of other venous thrombosis and embolism: Secondary | ICD-10-CM | POA: Insufficient documentation

## 2020-11-25 DIAGNOSIS — Z933 Colostomy status: Secondary | ICD-10-CM | POA: Diagnosis not present

## 2020-11-25 DIAGNOSIS — I10 Essential (primary) hypertension: Secondary | ICD-10-CM | POA: Insufficient documentation

## 2020-11-25 DIAGNOSIS — F419 Anxiety disorder, unspecified: Secondary | ICD-10-CM | POA: Insufficient documentation

## 2020-11-25 LAB — CBC WITH DIFFERENTIAL (CANCER CENTER ONLY)
Abs Immature Granulocytes: 0.02 10*3/uL (ref 0.00–0.07)
Basophils Absolute: 0.1 10*3/uL (ref 0.0–0.1)
Basophils Relative: 1 %
Eosinophils Absolute: 0.1 10*3/uL (ref 0.0–0.5)
Eosinophils Relative: 1 %
HCT: 41.6 % (ref 36.0–46.0)
Hemoglobin: 13.5 g/dL (ref 12.0–15.0)
Immature Granulocytes: 0 %
Lymphocytes Relative: 26 %
Lymphs Abs: 2 10*3/uL (ref 0.7–4.0)
MCH: 31.3 pg (ref 26.0–34.0)
MCHC: 32.5 g/dL (ref 30.0–36.0)
MCV: 96.5 fL (ref 80.0–100.0)
Monocytes Absolute: 0.8 10*3/uL (ref 0.1–1.0)
Monocytes Relative: 10 %
Neutro Abs: 4.9 10*3/uL (ref 1.7–7.7)
Neutrophils Relative %: 62 %
Platelet Count: 372 10*3/uL (ref 150–400)
RBC: 4.31 MIL/uL (ref 3.87–5.11)
RDW: 12.7 % (ref 11.5–15.5)
WBC Count: 7.8 10*3/uL (ref 4.0–10.5)
nRBC: 0 % (ref 0.0–0.2)

## 2020-11-25 LAB — IRON AND TIBC
Iron: 158 ug/dL — ABNORMAL HIGH (ref 41–142)
Saturation Ratios: 50 % (ref 21–57)
TIBC: 319 ug/dL (ref 236–444)
UIBC: 160 ug/dL (ref 120–384)

## 2020-11-25 LAB — CMP (CANCER CENTER ONLY)
ALT: 17 U/L (ref 0–44)
AST: 25 U/L (ref 15–41)
Albumin: 4 g/dL (ref 3.5–5.0)
Alkaline Phosphatase: 105 U/L (ref 38–126)
Anion gap: 7 (ref 5–15)
BUN: 12 mg/dL (ref 8–23)
CO2: 27 mmol/L (ref 22–32)
Calcium: 9.6 mg/dL (ref 8.9–10.3)
Chloride: 105 mmol/L (ref 98–111)
Creatinine: 1 mg/dL (ref 0.44–1.00)
GFR, Estimated: >60 mL/min (ref >60)
Glucose, Bld: 86 mg/dL (ref 70–99)
Potassium: 4.4 mmol/L (ref 3.5–5.1)
Sodium: 139 mmol/L (ref 135–145)
Total Bilirubin: 0.4 mg/dL (ref 0.3–1.2)
Total Protein: 7.2 g/dL (ref 6.5–8.1)

## 2020-11-25 LAB — CEA (IN HOUSE-CHCC): CEA (CHCC-In House): 3.11 ng/mL (ref 0.00–5.00)

## 2020-11-25 LAB — FERRITIN: Ferritin: 50 ng/mL (ref 11–307)

## 2020-11-27 ENCOUNTER — Ambulatory Visit: Payer: Medicare HMO | Admitting: Nurse Practitioner

## 2020-11-27 ENCOUNTER — Encounter: Payer: Self-pay | Admitting: Nurse Practitioner

## 2020-11-27 VITALS — BP 122/76 | HR 72 | Ht 62.0 in | Wt 123.6 lb

## 2020-11-27 DIAGNOSIS — Z85038 Personal history of other malignant neoplasm of large intestine: Secondary | ICD-10-CM

## 2020-11-27 MED ORDER — SUPREP BOWEL PREP KIT 17.5-3.13-1.6 GM/177ML PO SOLN: ORAL | 0 refills | Status: DC

## 2020-11-27 NOTE — Progress Notes (Signed)
ASSESSMENT AND PLAN    # Rectosigmoid cancer,  stage III, [id number]. In March 2021 she presented with sigmoid perforation from obstructing rectal cancer. A week later required second colon resection for perforated transverse / descending colon. She has a permanent transverse colostomy. No distant mets on CT scan.  Three of 15 lymph nodes positive.  Wasn't a candidate for adjuvant chemotherapy after surgery due to slow recovery. Post -op course complicated by intraabdominal fluid collection requiring drain placement by IR, AKI, respiratory failure requiring tracheostomy, DVT.  Abdominal drains are out. She has hasd three negative Guardant Reveal studies ( detects recurrent / residual disease).  --Patient needs a colonoscopy, she has never had one.  The risks and benefits of colonoscopy with possible polypectomy / biopsies were discussed and the patient agrees to proceed.   # Intermittent peristomal skin irritation. This has improved for time being. Using Neosporin Has appliance on now but shows me a photo on phone of how stoma looked three days ago. Some areas of denuded skin around the stoma.  --If patient has ongoing / recurrent peristomal irritation then recommend seeing a Wound Ostomy Nurse as we do not generally manage these problems. We are happy to make a referral if she has recurrent problems.      HISTORY OF PRESENT ILLNESS     Primary Gastroenterologist : new - Upland Cellar, MD  Chief Complaint :  Recent peristomal irritation, also needs a colonoscopy. History of perforated rectal cancer.   Tracy [name] is a 69 y.o. female with PMH / [place] significant for,  but not necessarily limited to: colon cancer, asthma  Patient presented to the ED in March 2021 for evaluation of abdominal pain.  CT scan showed possible perforation at splenic flexure.  She was taken to the OR for a low anterior resection with end colostomy for an obstructing proximal rectal cancer with sigmoid  perforation.  Rectosigmoid pathology compatible with invasive adenocarcinoma, tumor invading the visceral peritoneum, metastatic carcinoma in 3 of 15 lymph nodes. A week later she developed increased drainage output, CT scan on 3/30 showed free intra-abdominal air concerning for ischemic bowel.  Patient was taken back to the OR for laparotomy with colostomy takedown, left colectomy, peritoneal irrigation and debridement, creation of new transverse end colostomy for perforated transverse and descending colon. Pathology c/w  multiple mucosal ulcers with necrotizing inflammation.  Her postop course was complicated by left sided intraabdominal fluid collection requiring percutaneous drain placement by IR, AKI, DVT, respiratory failure requiring intubation and eventually tracheostomy.      Patient keeps bowels moving with good hydration and prunes. She has no GI complaints. She has has intermittent problems with a peristomal rash / irritation. Currently the rash is better after using neosporin and allowing for some time without the appliance.   Previous Endoscopic Evaluations / Pertinent Studies:  none   Past Medical History:  Diagnosis Date  . Acute deep vein thrombosis (DVT) of brachial vein of right upper extremity ([place])   . Acute on chronic respiratory failure with hypoxia ([place])   . Acute respiratory failure with hypoxemia ([place])   . Asthma   . Cancer ([place])   . CH[place], HX OF 01/06/2011   Qualifier: Diagnosis of  By: Charlett Blake MD, Erline Levine    . COPD (chronic obstructive pulmonary disease) ([place]) 2011   FeV1 31% predicted FeV1/FVX 47 %  . COPD, severe ([place])   . Essential hypertension 05/23/2020  . Hemorrhoid   . Open right radial  fracture 2020  . Osteopenia   . Perforated sigmoid colon ([place]) 03/03/2020  . Pleural effusion   . Pneumonia 2021  . Postoperative intra-abdominal abscess 2021  . Seasonal allergies    takes Claritin daily prn  . Shock circulatory ([place])   . Tracheostomy status ([place])   .  Vitamin D deficiency    takes Vit d every 14 days     Past Surgical History:  Procedure Laterality Date  . AUGMENTATION MAMMAPLASTY     saline  . EXAMINATION UNDER ANESTHESIA  10/14/2012   Procedure: EXAM UNDER ANESTHESIA;  Surgeon: Gayland Curry, MD,FACS;  Location: [facility];  Service: General;  Laterality: N/A;  rectal exam under anesthesia excisional hemorrhoidectomy hemorrhoidal banding x two  . EXAMINATION UNDER ANESTHESIA  02/03/2013   excision hemorrhoidal tissue  . FOOT SURGERY     left bunionectomy  . HEMORRHOIDECTOMY WITH HEMORRHOID BANDING  10/14/2012   Procedure: HEMORRHOIDECTOMY WITH HEMORRHOID BANDING;  Surgeon: Gayland Curry, MD,FACS;  Location: [facility];  Service: General;  Laterality: N/A;  . IR THORACENTESIS ASP PLEURAL SPACE W/IMG GUIDE  04/23/2020  . LAPAROTOMY N/A 03/03/2020   Procedure: low anterior resection end colostomy;  Surgeon: [name], MD;  Location: [facility];  Service: General;  Laterality: N/A;  . LAPAROTOMY N/A 03/12/2020   Procedure: EXPLORATORY LAPAROTOMY WITH BOWEL RESECTION AND COLOSTOMY;  Surgeon: [name], MD;  Location: [facility];  Service: General;  Laterality: N/A;  . OPEN REDUCTION INTERNAL FIXATION (ORIF) DISTAL RADIAL FRACTURE Right 11/01/2018   Procedure: OPEN REDUCTION INTERNAL FIXATION (ORIF) DISTAL RADIAL FRACTURE;  Surgeon: [name], MD;  Location: [facility];  Service: Orthopedics;  Laterality: Right;  . TONSILLECTOMY  1960   recurrent otitis media  . US ECHOCARDIOGRAPHY  02/2020    poor windows, normal LV function, severely dilated RV with moderately reduced function, RV volume and pressure overload, mildly dilated RA   Family History  Problem Relation Age of Onset  . Osteoporosis Mother   . Other Father        [place] in snow, broke ankle, blood clot to lungs  . Heart murmur Sister   . Cancer Sister        leukemia   . Other Brother        h/o being hit by lightening  . Parkinsonism Brother   . Asthma Son   .  Allergies Son   . Otitis media Son   . Other Sister        CML  . Asthma Son   . Allergies Son   . Pancreatic cancer Brother 11       diag December 30, 2013-deceased 03-01-14   Social History   Tobacco Use  . Smoking status: Former Smoker    Packs/day: 0.25    Years: 20.00    Pack years: 5.00    Types: Cigarettes    Quit date: 09/14/1991    Years since quitting: 29.2  . Smokeless tobacco: Never Used  Vaping Use  . Vaping Use: Never used  Substance Use Topics  . Alcohol use: Yes    Alcohol/week: 7.0 standard drinks    Types: 7 Glasses of wine per week    Comment: stopped in 02/2020  . Drug use: No   Current Outpatient Medications  Medication Sig Dispense Refill  . albuterol (PROVENTIL) (2.5 MG/3ML) 0.083% nebulizer solution Take by nebulization.    Marland Kitchen albuterol (VENTOLIN HFA) 108 (90 Base) MCG/ACT inhaler Inhale 2 puffs into the lungs every 6 (six)  hours as needed for wheezing or shortness of breath. 8 g 5  . ALPRAZolam (XANAX) 0.25 MG tablet Take 1 tablet (0.25 mg total) by mouth at bedtime as needed for anxiety. 30 tablet 0  . diphenhydrAMINE (BENADRYL) 25 mg capsule Take 25 mg by mouth every 6 (six) hours as needed.    . docusate sodium (COLACE) 50 MG capsule Take 50 mg by mouth daily as needed for mild constipation.    . fluticasone (FLONASE) 50 MCG/ACT nasal spray Place 2 sprays into both nostrils daily. 16 mL 5  . Fluticasone-Umeclidin-Vilant (TRELEGY ELLIPTA) 100-62.5-25 MCG/INH AEPB Inhale 1 puff into the lungs daily. 60 each 5   No current facility-administered medications for this visit.   Allergies  Allergen Reactions  . Amlodipine     Leg swelling.   . Codeine Swelling     Review of Systems: All systems reviewed and negative except where noted in HPI.   PHYSICAL EXAM :    Wt Readings from Last 3 Encounters:  11/27/20 123 lb 9.6 oz (56.1 kg)  11/25/20 123 lb 9.6 oz (56.1 kg)  10/08/20 119 lb (54 kg)    BP 122/76   Pulse 72   Ht 5\' 2"  (1.575 m)   Wt 123 lb  9.6 oz (56.1 kg)   LMP 12/14/2000   BMI 22.61 kg/m  Constitutional:  Pleasant female in no acute distress. Psychiatric: Normal mood and affect. Behavior is normal. EENT: Pupils normal.  Conjunctivae are normal. No scleral icterus. Neck supple.  Cardiovascular: Normal rate, regular rhythm. No edema Pulmonary/chest: Effort normal and breath sounds normal. No wheezing, rales or rhonchi. Abdominal: Soft, nondistended, nontender.Large left abdominal hernia. Colostomy with liquid brown stool. Bowel sounds active throughout. There are no masses palpable. Neurological: Alert and oriented to person place and time. Skin: Skin is warm and dry. No rashes noted.  Tracy Savoy, NP  11/27/2020, 10:51 AM  Cc:  Referring Provider Raoul Pitch, Renee A, DO

## 2020-11-27 NOTE — Progress Notes (Signed)
Agree with assessment and plan as outlined.  

## 2020-11-27 NOTE — Patient Instructions (Signed)
You have been scheduled for a colonoscopy. Please follow written instructions given to you at your visit today.  Please pick up your prep supplies at the pharmacy within the next 1-3 days. If you use inhalers (even only as needed), please bring them with you on the day of your procedure.   If you are age 67 or older, your body mass index should be between 23-30. Your Body mass index is 22.61 kg/m. If this is out of the aforementioned range listed, please consider follow up with your Primary Care Provider.  If you are age 45 or younger, your body mass index should be between 19-25. Your Body mass index is 22.61 kg/m. If this is out of the aformentioned range listed, please consider follow up with your Primary Care Provider.    Due to recent changes in healthcare laws, you may see the results of your imaging and laboratory studies on MyChart before your provider has had a chance to review them.  We understand that in some cases there may be results that are confusing or concerning to you. Not all laboratory results come back in the same time frame and the provider may be waiting for multiple results in order to interpret others.  Please give [name] 48 hours in order for your provider to thoroughly review all the results before contacting the office for clarification of your results.   I appreciate the  opportunity to care for you  Thank You   Tye Savoy, NP

## 2020-11-29 ENCOUNTER — Telehealth (INDEPENDENT_AMBULATORY_CARE_PROVIDER_SITE_OTHER): Payer: Medicare HMO | Admitting: Family Medicine

## 2020-11-29 ENCOUNTER — Encounter: Payer: Self-pay | Admitting: Family Medicine

## 2020-11-29 DIAGNOSIS — J441 Chronic obstructive pulmonary disease with (acute) exacerbation: Secondary | ICD-10-CM

## 2020-11-29 MED ORDER — PREDNISONE 50 MG PO TABS: 50.0000 mg | ORAL_TABLET | Freq: Every day | ORAL | 0 refills | Status: DC

## 2020-11-29 MED ORDER — DOXYCYCLINE HYCLATE 100 MG PO TABS: 100.0000 mg | ORAL_TABLET | Freq: Two times a day (BID) | ORAL | 0 refills | Status: DC

## 2020-11-29 MED ORDER — HYDROCODONE-HOMATROPINE 5-1.5 MG/5ML PO SYRP: 5.0000 mL | ORAL_SOLUTION | Freq: Three times a day (TID) | ORAL | 0 refills | Status: DC | PRN

## 2020-11-29 NOTE — Progress Notes (Signed)
VIRTUAL VISIT VIA VIDEO  I connected with Tracy [name] on 11/29/20 at  4:00 PM EST by elemedicine application and verified that I am speaking with the correct person using two identifiers. Location patient: Home Location provider: [facility], Office Persons participating in the virtual visit: Patient, Dr. Raoul Pitch and Samul Dada, CMA  I discussed the limitations of evaluation and management by telemedicine and the availability of in person appointments. The patient expressed understanding and agreed to proceed.   SUBJECTIVE Chief Complaint  Patient presents with  . Cough    Pt c/o productive cough with yellowish/brownish tint, nasal congestion, runny nose, SOB, wheezing x 2 weeks     HPI: Tracy [name] is a 69 y.o. female present for acute concern.  Patient reports she has had a productive cough with yellowish/brown sputum production, nasal congestion, runny nose and wheezing for about 2 weeks.  She states she is compliant with her inhaler use.  She has needed to use her albuterol 1-2 times daily to help with wheezing.  She is becoming fatigued.  She denies fever, chills, nausea, vomit or GI complaints.  She feels winded with minimal activity.  She has grandchildren in which she can be exposed to viruses easily.  She denies loss of sense of taste or smell. Moderna series and booster completed. ROS: See pertinent positives and negatives per HPI.  Patient Active Problem List   Diagnosis Date Noted  . Colostomy status ([place]) 05/23/2020  . 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 Rectal cancer ([place]) 05/23/2020  . COPD, severe ([place])   . Acute deep vein thrombosis (DVT) of brachial vein of right upper extremity ([place])   . HIP PAIN, LEFT 01/06/2011    Social History   Tobacco Use  . Smoking status: Former Smoker    Packs/day: 0.25    Years: 20.00    Pack years: 5.00    Types: Cigarettes    Quit date: 09/14/1991    Years since quitting: 29.2  . Smokeless  tobacco: Never Used  Substance Use Topics  . Alcohol use: Yes    Alcohol/week: 7.0 standard drinks    Types: 7 Glasses of wine per week    Comment: stopped in 02/2020    Current Outpatient Medications:  .  albuterol (PROVENTIL) (2.5 MG/3ML) 0.083% nebulizer solution, Take by nebulization., Disp: , Rfl:  .  albuterol (VENTOLIN HFA) 108 (90 Base) MCG/ACT inhaler, Inhale 2 puffs into the lungs every 6 (six) hours as needed for wheezing or shortness of breath., Disp: 8 g, Rfl: 5 .  ALPRAZolam (XANAX) 0.25 MG tablet, Take 1 tablet (0.25 mg total) by mouth at bedtime as needed for anxiety., Disp: 30 tablet, Rfl: 0 .  diphenhydrAMINE (BENADRYL) 25 mg capsule, Take 25 mg by mouth every 6 (six) hours as needed., Disp: , Rfl:  .  docusate sodium (COLACE) 50 MG capsule, Take 50 mg by mouth daily as needed for mild constipation., Disp: , Rfl:  .  fluticasone (FLONASE) 50 MCG/ACT nasal spray, Place 2 sprays into both nostrils daily., Disp: 16 mL, Rfl: 5 .  Fluticasone-Umeclidin-Vilant (TRELEGY ELLIPTA) 100-62.5-25 MCG/INH AEPB, Inhale 1 puff into the lungs daily., Disp: 60 each, Rfl: 5 .  doxycycline (VIBRA-TABS) 100 MG tablet, Take 1 tablet (100 mg total) by mouth 2 (two) times daily., Disp: 20 tablet, Rfl: 0 .  HYDROcodone-homatropine (HYCODAN) 5-1.5 MG/5ML syrup, Take 5 mLs by mouth every 8 (eight) hours as needed for cough., Disp: 120 mL, Rfl:  0 .  Na Sulfate-K Sulfate-Mg Sulf (SUPREP BOWEL PREP KIT) 17.5-3.13-1.6 GM/177ML SOLN, 1 kit for colon prep (Patient not taking: Reported on 11/29/2020), Disp: 345 mL, Rfl: 0 .  predniSONE (DELTASONE) 50 MG tablet, Take 1 tablet (50 mg total) by mouth daily with breakfast., Disp: 5 tablet, Rfl: 0  Allergies  Allergen Reactions  . Amlodipine     Leg swelling.   . Codeine Swelling    OBJECTIVE: LMP 12/14/2000  Gen: No acute distress. Nontoxic in appearance.  HENT: AT. [place].  MMM.  Eyes:Pupils Equal Round Reactive to light, Extraocular movements intact,   Conjunctiva without redness, discharge or icterus. Chest: Cough was not on exam.  Mildly winded with activity. Skin: no rashes, purpura or petechiae.  Neuro:  Normal gait. Alert. Oriented x3  Psych: Normal affect and demeanor. Normal speech. Normal thought content and judgment.  ASSESSMENT AND PLAN: Tracy [name] is a 69 y.o. female present for  COPD exacerbation ([place]) Rest, hydrate.  Begin use of mucinex (DM if cough), nettie pot or nasal saline.  Doxycycline twice daily and prednisone burst prescribed, take until completed.  Hycodan cough syrup If cough present it can last up to 6-8 weeks.  F/U 2 weeks of not improved.    Howard Pouch, DO 11/29/2020   No follow-ups on file.  No orders of the defined types were placed in this encounter.  Meds ordered this encounter  Medications  . doxycycline (VIBRA-TABS) 100 MG tablet    Sig: Take 1 tablet (100 mg total) by mouth 2 (two) times daily.    Dispense:  20 tablet    Refill:  0  . predniSONE (DELTASONE) 50 MG tablet    Sig: Take 1 tablet (50 mg total) by mouth daily with breakfast.    Dispense:  5 tablet    Refill:  0  . HYDROcodone-homatropine (HYCODAN) 5-1.5 MG/5ML syrup    Sig: Take 5 mLs by mouth every 8 (eight) hours as needed for cough.    Dispense:  120 mL    Refill:  0   Referral Orders  No referral(s) requested today

## 2020-11-30 DIAGNOSIS — Z933 Colostomy status: Secondary | ICD-10-CM | POA: Diagnosis not present

## 2020-12-01 DIAGNOSIS — Z933 Colostomy status: Secondary | ICD-10-CM | POA: Diagnosis not present

## 2020-12-02 DIAGNOSIS — Z933 Colostomy status: Secondary | ICD-10-CM | POA: Diagnosis not present

## 2020-12-03 DIAGNOSIS — Z933 Colostomy status: Secondary | ICD-10-CM | POA: Diagnosis not present

## 2020-12-04 ENCOUNTER — Telehealth: Payer: Self-pay | Admitting: Family Medicine

## 2020-12-04 DIAGNOSIS — Z933 Colostomy status: Secondary | ICD-10-CM | POA: Diagnosis not present

## 2020-12-04 MED ORDER — HYDROCODONE-HOMATROPINE 5-1.5 MG/5ML PO SYRP
5.0000 mL | ORAL_SOLUTION | Freq: Three times a day (TID) | ORAL | 0 refills | Status: DC | PRN
Start: 2020-12-04 — End: 2021-01-01

## 2020-12-04 NOTE — Telephone Encounter (Signed)
Patient had video visit 11/29/20 and Dr. Raoul Pitch prescribed Hycodan syrup. Patient states the pharmacy tried, but has been unable to get that medication in stock. Please re-send RX to [facility], [phone] [place], [place].

## 2020-12-04 NOTE — Telephone Encounter (Signed)
OK hycodan eRx to requested pharmacy

## 2020-12-04 NOTE — Telephone Encounter (Signed)
Pt advised new Rx sent in.

## 2020-12-04 NOTE — Telephone Encounter (Signed)
Please advise 

## 2020-12-05 DIAGNOSIS — Z933 Colostomy status: Secondary | ICD-10-CM | POA: Diagnosis not present

## 2020-12-06 DIAGNOSIS — Z933 Colostomy status: Secondary | ICD-10-CM | POA: Diagnosis not present

## 2020-12-07 DIAGNOSIS — Z933 Colostomy status: Secondary | ICD-10-CM | POA: Diagnosis not present

## 2020-12-08 DIAGNOSIS — Z933 Colostomy status: Secondary | ICD-10-CM | POA: Diagnosis not present

## 2020-12-09 DIAGNOSIS — Z933 Colostomy status: Secondary | ICD-10-CM | POA: Diagnosis not present

## 2020-12-10 DIAGNOSIS — Z933 Colostomy status: Secondary | ICD-10-CM | POA: Diagnosis not present

## 2020-12-11 DIAGNOSIS — Z1231 Encounter for screening mammogram for malignant neoplasm of breast: Secondary | ICD-10-CM | POA: Diagnosis not present

## 2020-12-11 DIAGNOSIS — Z933 Colostomy status: Secondary | ICD-10-CM | POA: Diagnosis not present

## 2020-12-11 LAB — HM MAMMOGRAPHY

## 2020-12-12 DIAGNOSIS — Z933 Colostomy status: Secondary | ICD-10-CM | POA: Diagnosis not present

## 2020-12-13 DIAGNOSIS — Z933 Colostomy status: Secondary | ICD-10-CM | POA: Diagnosis not present

## 2020-12-14 DIAGNOSIS — Z933 Colostomy status: Secondary | ICD-10-CM | POA: Diagnosis not present

## 2020-12-17 ENCOUNTER — Telehealth: Payer: Self-pay | Admitting: Pulmonary Disease

## 2020-12-17 ENCOUNTER — Telehealth: Payer: Self-pay

## 2020-12-17 MED ORDER — ALBUTEROL SULFATE (2.5 MG/3ML) 0.083% IN NEBU: 2.5000 mg | INHALATION_SOLUTION | RESPIRATORY_TRACT | 5 refills | Status: DC | PRN

## 2020-12-17 NOTE — Telephone Encounter (Signed)
Spoke with pt, requesting albuterol neb to be sent to pharmacy.  This has been sent as requested. Nothing further needed at this time- will close encounter.

## 2020-12-17 NOTE — Telephone Encounter (Signed)
Patient advised of mammogram results.  °

## 2020-12-17 NOTE — Telephone Encounter (Signed)
ATC x1.  LVM to return call. 

## 2020-12-17 NOTE — Telephone Encounter (Signed)
Please inform patient mammogram is normal. 

## 2020-12-17 NOTE — Telephone Encounter (Signed)
Received Mammogram results from [facility] on 12/17/20. Placed on PCP desk for review.(after lunch will be on your desk)

## 2021-01-01 ENCOUNTER — Other Ambulatory Visit: Payer: Self-pay

## 2021-01-01 ENCOUNTER — Encounter: Payer: Self-pay | Admitting: Family Medicine

## 2021-01-01 ENCOUNTER — Ambulatory Visit (INDEPENDENT_AMBULATORY_CARE_PROVIDER_SITE_OTHER): Payer: Medicare HMO | Admitting: Family Medicine

## 2021-01-01 VITALS — BP 109/79 | HR 82 | Temp 97.4°F | Ht 61.25 in | Wt 126.0 lb

## 2021-01-01 DIAGNOSIS — Z131 Encounter for screening for diabetes mellitus: Secondary | ICD-10-CM | POA: Diagnosis not present

## 2021-01-01 DIAGNOSIS — Z13220 Encounter for screening for lipoid disorders: Secondary | ICD-10-CM

## 2021-01-01 DIAGNOSIS — Z0000 Encounter for general adult medical examination without abnormal findings: Secondary | ICD-10-CM | POA: Diagnosis not present

## 2021-01-01 DIAGNOSIS — L989 Disorder of the skin and subcutaneous tissue, unspecified: Secondary | ICD-10-CM

## 2021-01-01 DIAGNOSIS — Z1159 Encounter for screening for other viral diseases: Secondary | ICD-10-CM | POA: Diagnosis not present

## 2021-01-01 DIAGNOSIS — E2839 Other primary ovarian failure: Secondary | ICD-10-CM | POA: Diagnosis not present

## 2021-01-01 DIAGNOSIS — J449 Chronic obstructive pulmonary disease, unspecified: Secondary | ICD-10-CM | POA: Diagnosis not present

## 2021-01-01 DIAGNOSIS — I82621 Acute embolism and thrombosis of deep veins of right upper extremity: Secondary | ICD-10-CM | POA: Diagnosis not present

## 2021-01-01 DIAGNOSIS — Z933 Colostomy status: Secondary | ICD-10-CM

## 2021-01-01 DIAGNOSIS — Z1231 Encounter for screening mammogram for malignant neoplasm of breast: Secondary | ICD-10-CM

## 2021-01-01 DIAGNOSIS — C20 Malignant neoplasm of rectum: Secondary | ICD-10-CM

## 2021-01-01 LAB — LIPID PANEL
Cholesterol: 284 mg/dL — ABNORMAL HIGH (ref 0–200)
HDL: 116.7 mg/dL (ref >39.00)
LDL Cholesterol: 143 mg/dL — ABNORMAL HIGH (ref 0–99)
NonHDL: 167.08
Total CHOL/HDL Ratio: 2
Triglycerides: 121 mg/dL (ref 0.0–149.0)
VLDL: 24.2 mg/dL (ref 0.0–40.0)

## 2021-01-01 LAB — HEMOGLOBIN A1C: Hgb A1c MFr Bld: 5.8 % (ref 4.6–6.5)

## 2021-01-01 MED ORDER — ZOSTER VAC RECOMB ADJUVANTED 50 MCG/0.5ML IM SUSR: 0.5000 mL | Freq: Once | INTRAMUSCULAR | 1 refills | Status: AC

## 2021-01-01 NOTE — Patient Instructions (Signed)
Health Maintenance After Age 68 After age 68, you are at a higher risk for certain long-term diseases and infections as well as injuries from falls. Falls are a major cause of broken bones and head injuries in people who are older than age 68. Getting regular preventive care can help to keep you healthy and well. Preventive care includes getting regular testing and making lifestyle changes as recommended by your health care provider. Talk with your health care provider about:  Which screenings and tests you should have. A screening is a test that checks for a disease when you have no symptoms.  A diet and exercise plan that is right for you. What should I know about screenings and tests to prevent falls? Screening and testing are the best ways to find a health problem early. Early diagnosis and treatment give you the best chance of managing medical conditions that are common after age 68. Certain conditions and lifestyle choices may make you more likely to have a fall. Your health care provider may recommend:  Regular vision checks. Poor vision and conditions such as cataracts can make you more likely to have a fall. If you wear glasses, make sure to get your prescription updated if your vision changes.  Medicine review. Work with your health care provider to regularly review all of the medicines you are taking, including over-the-counter medicines. Ask your health care provider about any side effects that may make you more likely to have a fall. Tell your health care provider if any medicines that you take make you feel dizzy or sleepy.  Osteoporosis screening. Osteoporosis is a condition that causes the bones to get weaker. This can make the bones weak and cause them to break more easily.  Blood pressure screening. Blood pressure changes and medicines to control blood pressure can make you feel dizzy.  Strength and balance checks. Your health care provider may recommend certain tests to check your  strength and balance while standing, walking, or changing positions.  Foot health exam. Foot pain and numbness, as well as not wearing proper footwear, can make you more likely to have a fall.  Depression screening. You may be more likely to have a fall if you have a fear of falling, feel emotionally low, or feel unable to do activities that you used to do.  Alcohol use screening. Using too much alcohol can affect your balance and may make you more likely to have a fall. What actions can I take to lower my risk of falls? General instructions  Talk with your health care provider about your risks for falling. Tell your health care provider if: ? You fall. Be sure to tell your health care provider about all falls, even ones that seem minor. ? You feel dizzy, sleepy, or off-balance.  Take over-the-counter and prescription medicines only as told by your health care provider. These include any supplements.  Eat a healthy diet and maintain a healthy weight. A healthy diet includes low-fat dairy products, low-fat (lean) meats, and fiber from whole grains, beans, and lots of fruits and vegetables. Home safety  Remove any tripping hazards, such as rugs, cords, and clutter.  Install safety equipment such as grab bars in bathrooms and safety rails on stairs.  Keep rooms and walkways well-lit. Activity  Follow a regular exercise program to stay fit. This will help you maintain your balance. Ask your health care provider what types of exercise are appropriate for you.  If you need a cane or walker,   use it as recommended by your health care provider.  Wear supportive shoes that have nonskid soles.   Lifestyle  Do not drink alcohol if your health care provider tells you not to drink.  If you drink alcohol, limit how much you have: ? 0-1 drink a day for women. ? 0-2 drinks a day for men.  Be aware of how much alcohol is in your drink. In the [place] one drink equals one typical bottle of beer (12  oz), one-half glass of wine (5 oz), or one shot of hard liquor (1 oz).  Do not use any products that contain nicotine or tobacco, such as cigarettes and e-cigarettes. If you need help quitting, ask your health care provider. Summary  Having a healthy lifestyle and getting preventive care can help to protect your health and wellness after age 68.  Screening and testing are the best way to find a health problem early and help you avoid having a fall. Early diagnosis and treatment give you the best chance for managing medical conditions that are more common for people who are older than age 68.  Falls are a major cause of broken bones and head injuries in people who are older than age 68. Take precautions to prevent a fall at home.  Work with your health care provider to learn what changes you can make to improve your health and wellness and to prevent falls. This information is not intended to replace advice given to you by your health care provider. Make sure you discuss any questions you have with your health care provider. Document Revised: 03/23/2019 Document Reviewed: 10/13/2017 Elsevier Patient Education  2021 [employer]  

## 2021-01-01 NOTE — Progress Notes (Signed)
This visit occurred during the SARS-CoV-2 public health emergency.  Safety protocols were in place, including screening questions prior to the visit, additional usage of staff PPE, and extensive cleaning of exam room while observing appropriate contact time as indicated for disinfecting solutions.    Patient ID: Tracy [name], female  DOB: 1951-03-27, 70 y.o.   MRN: [id number] Patient Care Team    Relationship Specialty Notifications Start End  [name], DO PCP - General Family Medicine  05/21/20   Jonnie Finner, RN Oncology Nurse Navigator  All results, Admissions 05/07/20    Comment: Phone:  [phone]  Fax:  [phone]  [name], [place] Physician Hematology All results, Admissions 05/07/20   [name], DO Consulting Physician Pulmonary Disease  05/27/20   [facility]    05/27/20   [name], MD Consulting Physician General Surgery  08/30/90   [name], MD Consulting Physician General Surgery  05/27/20   [name], [name], MD Consulting Physician Gastroenterology  01/01/21     Chief Complaint  Patient presents with  . Annual Exam    Pt is fasting     Subjective:  Tracy [name] is a 70 y.o.  Female  present for CPE. All past medical history, surgical history, allergies, family history, immunizations, medications and social history were updated in the electronic medical record today. All recent labs, ED visits and hospitalizations within the last year were reviewed.  Health maintenance:  Colonoscopy: She has a colonoscopy scheduled with Dr. Havery Moros this coming Monday.  Patient has history of rectal cancer and colostomy. Mammogram: completed:11/2020, [facility] on [address]-and for 11/2021 Cervical cancer screening:>65 N/A Immunizations: tdap UTD 2014, Influenza UTD 2021 (encouraged yearly), PNA series 09/2018- due for prevnar today > unfortunately vaccines are still off-site due to snowstorms patient will make a nurse  visit next week to complete, shingrix printed for her today, covid series and booster completed Infectious disease screening: Hep C agreeable to test today.  DEXA: Patient has never had a bone density test in the past.  Estrogen deficient.  Ordered for her today at [facility] where she receives her mammograms Assistive device:none Oxygen TAV:WPVX Patient has a Dental home. Hospitalizations/ED visits: reviewed  Depression screen [facility] 2/9 01/01/2021 10/08/2020 07/02/2020  Decreased Interest 0 0 0  Down, Depressed, Hopeless 0 0 0  PHQ - 2 Score 0 0 0  Altered sleeping - - 0  Tired, decreased energy - - 0  Change in appetite - - 0  Feeling bad or failure about yourself  - - 0  Trouble concentrating - - 0  Moving slowly or fidgety/restless - - 0  Suicidal thoughts - - 0  PHQ-9 Score - - 0  Difficult doing work/chores - - Not difficult at all   No flowsheet data found.  Immunization History  Administered Date(s) Administered  . Fluad Quad(high Dose 65+) 10/27/2019, 10/08/2020  . Influenza Split 01/06/2013, 09/13/2013  . Influenza, High Dose Seasonal PF 10/10/2018  . Influenza,inj,Quad PF,6+ Mos 09/13/2014, 10/15/2015  . Influenza,inj,Quad PF,6-35 Mos 10/26/2016  . Moderna Sars-Covid-2 Vaccination 01/09/2020, 02/06/2020, 10/10/2020  . Pneumococcal Conjugate-13 07/12/2014  . Pneumococcal Polysaccharide-23 01/06/2013, 10/10/2018  . Tdap 05/15/2013  . Zoster 07/14/2013     Past Medical History:  Diagnosis Date  . Acute deep vein thrombosis (DVT) of brachial vein of right upper extremity ([place])   . Acute on chronic respiratory failure with hypoxia ([place])   . Asthma   . Cataract   .  [place], HX OF 01/06/2011   Qualifier: Diagnosis of  By: Charlett Blake MD, Erline Levine    . COPD (chronic obstructive pulmonary disease) ([place]) 2011   FeV1 31% predicted FeV1/FVX 47 %  . Essential hypertension 05/23/2020  . Hemorrhoid   . Open right radial fracture 2020  . Osteopenia   . Perforated sigmoid colon ([place])  03/03/2020  . Pleural effusion   . Pneumonia 2021  . Postoperative intra-abdominal abscess 2021  . Rectal cancer ([place])   . Seasonal allergies    takes Claritin daily prn  . Shock circulatory ([place])   . Tracheostomy status ([place])   . Vitamin D deficiency    takes Vit d every 14 days   Allergies  Allergen Reactions  . Amlodipine     Leg swelling.   . Codeine Swelling   Past Surgical History:  Procedure Laterality Date  . AUGMENTATION MAMMAPLASTY     saline  . EXAMINATION UNDER ANESTHESIA  10/14/2012   Procedure: EXAM UNDER ANESTHESIA;  Surgeon: Gayland Curry, MD,FACS;  Location: [place];  Service: General;  Laterality: N/A;  rectal exam under anesthesia excisional hemorrhoidectomy hemorrhoidal banding x two  . EXAMINATION UNDER ANESTHESIA  02/03/2013   excision hemorrhoidal tissue  . FOOT SURGERY     left bunionectomy  . HEMORRHOIDECTOMY WITH HEMORRHOID BANDING  10/14/2012   Procedure: HEMORRHOIDECTOMY WITH HEMORRHOID BANDING;  Surgeon: Gayland Curry, MD,FACS;  Location: [place];  Service: General;  Laterality: N/A;  . IR THORACENTESIS ASP PLEURAL SPACE W/IMG GUIDE  04/23/2020  . LAPAROTOMY N/A 03/03/2020   Procedure: low anterior resection end colostomy;  Surgeon: [name], MD;  Location: [facility];  Service: General;  Laterality: N/A;  . LAPAROTOMY N/A 03/12/2020   Procedure: EXPLORATORY LAPAROTOMY WITH BOWEL RESECTION AND COLOSTOMY;  Surgeon: [name], MD;  Location: [facility];  Service: General;  Laterality: N/A;  . OPEN REDUCTION INTERNAL FIXATION (ORIF) DISTAL RADIAL FRACTURE Right 11/01/2018   Procedure: OPEN REDUCTION INTERNAL FIXATION (ORIF) DISTAL RADIAL FRACTURE;  Surgeon: [name], MD;  Location: [place];  Service: Orthopedics;  Laterality: Right;  . TONSILLECTOMY  1960   recurrent otitis media  . US ECHOCARDIOGRAPHY  02/2020    poor windows, normal LV function, severely dilated RV with moderately reduced function, RV volume and pressure overload,  mildly dilated RA   Family History  Problem Relation Age of Onset  . Osteoporosis Mother   . Other Father        [place] in snow, broke ankle, blood clot to lungs  . Heart murmur Sister   . Cancer Sister        leukemia   . Other Brother        h/o being hit by lightening  . Parkinsonism Brother   . Asthma Son   . Allergies Son   . Otitis media Son   . Other Sister        CML  . Asthma Son   . Allergies Son   . Pancreatic cancer Brother 2       diag 11-Dec-2013-deceased 02/10/2014   Social History   Social History Narrative   Marital status/children/pets: Married.  2 children.   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   Safety:      -smoke alarm in the home:Yes     - wears seatbelt: Yes     - Feels safe in their relationships: Yes    Allergies as of 01/01/2021      Reactions   Amlodipine  Leg swelling.    Codeine Swelling      Medication List       Accurate as of January 01, 2021 12:45 PM. If you have any questions, ask your nurse or doctor.        STOP taking these medications   doxycycline 100 MG tablet Commonly known as: VIBRA-TABS Stopped by: Howard Pouch, DO   HYDROcodone-homatropine 5-1.5 MG/5ML syrup Commonly known as: HYCODAN Stopped by: Howard Pouch, DO   predniSONE 50 MG tablet Commonly known as: DELTASONE Stopped by: Howard Pouch, DO     TAKE these medications   albuterol 108 (90 Base) MCG/ACT inhaler Commonly known as: VENTOLIN HFA Inhale 2 puffs into the lungs every 6 (six) hours as needed for wheezing or shortness of breath.   albuterol (2.5 MG/3ML) 0.083% nebulizer solution Commonly known as: PROVENTIL Take 3 mLs (2.5 mg total) by nebulization every 4 (four) hours as needed for wheezing or shortness of breath. DX: J44.9   ALPRAZolam 0.25 MG tablet Commonly known as: XANAX Take 1 tablet (0.25 mg total) by mouth at bedtime as needed for anxiety.   diphenhydrAMINE 25 mg capsule Commonly known as: BENADRYL Take 25 mg by  mouth every 6 (six) hours as needed.   docusate sodium 50 MG capsule Commonly known as: COLACE Take 50 mg by mouth daily as needed for mild constipation.   fluticasone 50 MCG/ACT nasal spray Commonly known as: FLONASE Place 2 sprays into both nostrils daily.   Suprep Bowel Prep Kit 17.5-3.13-1.6 GM/177ML Soln Generic drug: Na Sulfate-K Sulfate-Mg Sulf 1 kit for colon prep   Trelegy Ellipta 100-62.5-25 MCG/INH Aepb Generic drug: Fluticasone-Umeclidin-Vilant Inhale 1 puff into the lungs daily.   Zoster Vaccine Adjuvanted injection Commonly known as: SHINGRIX Inject 0.5 mLs into the muscle once for 1 dose. Rpt dose in 2-6 months once. Started by: Howard Pouch, DO       All past medical history, surgical history, allergies, family history, immunizations andmedications were updated in the EMR today and reviewed under the history and medication portions of their EMR.       ROS: 14 pt review of systems performed and negative (unless mentioned in an HPI)  Objective: BP 109/79   Pulse 82   Temp (!) 97.4 F (36.3 C) (Oral)   Ht 5' 1.25" (1.556 m)   Wt 126 lb (57.2 kg)   LMP 12/14/2000   SpO2 96%   BMI 23.61 kg/m  Gen: Afebrile. No acute distress. Nontoxic in appearance, well-developed, well-nourished, pleasant female HENT: AT. [place]. Bilateral TM visualized and normal in appearance, normal external auditory canal. MMM, no oral lesions, adequate dentition. Bilateral nares within normal limits. Throat without erythema, ulcerations or exudates. no Cough on exam, no hoarseness on exam. Eyes:Pupils Equal Round Reactive to light, Extraocular movements intact,  Conjunctiva without redness, discharge or icterus. Neck/lymp/endocrine: Supple,no lymphadenopathy, no thyromegaly CV: RRR no murmur, noedema, +2/4 P posterior tibialis pulses.  Chest: CTAB, no wheeze, rhonchi or crackles.  Normal respiratory effort.  Good air movement. Abd: Soft.  Flat-colostomy bag in place. NTND. BS present.  No  masses palpated. No hepatosplenomegaly. No rebound tenderness or guarding. Skin: No rashes, purpura or petechiae. Warm and well-perfused. Skin intact. Neuro/Msk:  Normal gait. PERLA. EOMi. Alert. Oriented x3.  Cranial nerves II through XII intact. Muscle strength 5/5 upper/lower extremity. DTRs equal bilaterally. Psych: Normal affect, dress and demeanor. Normal speech. Normal thought content and judgment. .  No exam data present  Assessment/plan: KANAE IGNATOWSKI is a  70 y.o. female present for CPE History of acute deep vein thrombosis (DVT) of brachial vein of right upper extremity (HCC) No longer anticoagulated.  COPD, severe ([place]) Follows with pulmonology.  Colostomy status (HCC)/Rectal cancer [facility]) Currently colostomy is doing well. Follows with oncology routinely every 3 months. viewed most recent labs dated in December 2021  Diabetes mellitus screening - Hemoglobin A1c Lipid screening - Lipid panel Need for hepatitis C screening test - Hepatitis C antibody Estrogen deficiency - DG Bone Density; Future Skin lesion Likely AK, but cannot rule out skin cancer. - Ambulatory referral to Dermatology Encounter for screening mammogram for malignant neoplasm of breast - MM 3D SCREEN BREAST BILATERAL; Future Routine general medical examination at a health care facility Patient was encouraged to exercise greater than 150 minutes a week. Patient was encouraged to choose a diet filled with fresh fruits and vegetables, and lean meats. AVS provided to patient today for education/recommendation on gender specific health and safety maintenance. Colonoscopy: She has a colonoscopy scheduled with Dr. Havery Moros this coming Monday.  Patient has history of rectal cancer and colostomy. Mammogram: completed:11/2020, [facility] [address]-and for 11/2021 Cervical cancer screening:>65 N/A Immunizations: tdap UTD 2014, Influenza UTD 2021 (encouraged yearly), PNA series 09/2018- due for prevnar today >  unfortunately vaccines are still off-site due to snowstorms patient will make a nurse visit next week to complete, shingrix printed for her today, covid series and booster completed Infectious disease screening: Hep C agreeable to test today.  DEXA: Patient has never had a bone density test in the past.  Estrogen deficient.  Ordered for her today at [facility] where she receives her mammograms  Return in about 1 year (around 01/01/2022) for CPE (30 min).   Orders Placed This Encounter  Procedures  . DG Bone Density  . MM 3D SCREEN BREAST BILATERAL  . Hemoglobin A1c  . Lipid panel  . Hepatitis C antibody  . Ambulatory referral to Dermatology   Meds ordered this encounter  Medications  . Zoster Vaccine Adjuvanted [facility]) injection    Sig: Inject 0.5 mLs into the muscle once for 1 dose. Rpt dose in 2-6 months once.    Dispense:  0.5 mL    Refill:  1    Referral Orders     Ambulatory referral to Dermatology   Electronically signed by: Howard Pouch, DO [place]

## 2021-01-02 LAB — HEPATITIS C ANTIBODY
Hepatitis C Ab: NONREACTIVE
SIGNAL TO CUT-OFF: 0.01 (ref <1.00)

## 2021-01-06 ENCOUNTER — Ambulatory Visit (AMBULATORY_SURGERY_CENTER): Payer: Medicare HMO | Admitting: Gastroenterology

## 2021-01-06 ENCOUNTER — Other Ambulatory Visit: Payer: Self-pay

## 2021-01-06 ENCOUNTER — Encounter: Payer: Self-pay | Admitting: Gastroenterology

## 2021-01-06 VITALS — BP 105/66 | HR 86 | Temp 97.1°F | Resp 20 | Ht 62.0 in | Wt 123.0 lb

## 2021-01-06 DIAGNOSIS — Z85038 Personal history of other malignant neoplasm of large intestine: Secondary | ICD-10-CM | POA: Diagnosis not present

## 2021-01-06 DIAGNOSIS — D120 Benign neoplasm of cecum: Secondary | ICD-10-CM | POA: Diagnosis not present

## 2021-01-06 DIAGNOSIS — D121 Benign neoplasm of appendix: Secondary | ICD-10-CM

## 2021-01-06 DIAGNOSIS — D123 Benign neoplasm of transverse colon: Secondary | ICD-10-CM | POA: Diagnosis not present

## 2021-01-06 DIAGNOSIS — C181 Malignant neoplasm of appendix: Secondary | ICD-10-CM

## 2021-01-06 DIAGNOSIS — J449 Chronic obstructive pulmonary disease, unspecified: Secondary | ICD-10-CM | POA: Diagnosis not present

## 2021-01-06 DIAGNOSIS — J45909 Unspecified asthma, uncomplicated: Secondary | ICD-10-CM | POA: Diagnosis not present

## 2021-01-06 MED ORDER — SODIUM CHLORIDE 0.9 % IV SOLN: 500.0000 mL | Freq: Once | INTRAVENOUS | Status: DC

## 2021-01-06 NOTE — Patient Instructions (Signed)
YOU HAD AN ENDOSCOPIC PROCEDURE TODAY AT THE [facility]:   Refer to the procedure report that was given to you for any specific questions about what was found during the examination.  If the procedure report does not answer your questions, please call your gastroenterologist to clarify.  If you requested that your care partner not be given the details of your procedure findings, then the procedure report has been included in a sealed envelope for you to review at your convenience later.  YOU SHOULD EXPECT: Some feelings of bloating in the abdomen. Passage of more gas than usual.  Walking can help get rid of the air that was put into your GI tract during the procedure and reduce the bloating. If you had a lower endoscopy (such as a colonoscopy or flexible sigmoidoscopy) you may notice spotting of blood in your stool or on the toilet paper. If you underwent a bowel prep for your procedure, you may not have a normal bowel movement for a few days.  Please Note:  You might notice some irritation and congestion in your nose or some drainage.  This is from the oxygen used during your procedure.  There is no need for concern and it should clear up in a day or so.  SYMPTOMS TO REPORT IMMEDIATELY:   Following lower endoscopy (colonoscopy or flexible sigmoidoscopy):  Excessive amounts of blood in the stool  Significant tenderness or worsening of abdominal pains  Swelling of the abdomen that is new, acute  Fever of 100F or higher  For urgent or emergent issues, a gastroenterologist can be reached at any hour by calling [phone]. Do not use MyChart messaging for urgent concerns.    DIET:  We do recommend a small meal at first, but then you may proceed to your regular diet.  Drink plenty of fluids but you should avoid alcoholic beverages for 24 hours.  ACTIVITY:  You should plan to take it easy for the rest of today and you should NOT DRIVE or use heavy machinery until tomorrow (because  of the sedation medicines used during the test).    FOLLOW UP: Our staff will call the number listed on your records 48-72 hours following your procedure to check on you and address any questions or concerns that you may have regarding the information given to you following your procedure. If we do not reach you, we will leave a message.  We will attempt to reach you two times.  During this call, we will ask if you have developed any symptoms of COVID 19. If you develop any symptoms (ie: fever, flu-like symptoms, shortness of breath, cough etc.) before then, please call [phone].  If you test positive for Covid 19 in the 2 weeks post procedure, please call and report this information to us.    If any biopsies were taken you will be contacted by phone or by letter within the next 1-3 weeks.  Please call us at [phone] if you have not heard about the biopsies in 3 weeks.    SIGNATURES/CONFIDENTIALITY: You and/or your care partner have signed paperwork which will be entered into your electronic medical record.  These signatures attest to the fact that that the information above on your After Visit Summary has been reviewed and is understood.  Full responsibility of the confidentiality of this discharge information lies with you and/or your care-partner. 

## 2021-01-06 NOTE — Progress Notes (Signed)
Called to room to assist during endoscopic procedure.  Patient ID and intended procedure confirmed with present staff. Received instructions for my participation in the procedure from the performing physician.  

## 2021-01-06 NOTE — Op Note (Signed)
[place] Patient Name: Tracy [name] Procedure Date: 01/06/2021 2:16 PM MRN: [id number] Endoscopist: Remo Lipps P. Havery Moros , MD Age: 70 Referring MD:  Date of Birth: 03-09-1951 Gender: Female Account #: [id number] Procedure:                Colonoscopy Indications:              High risk colon cancer surveillance: Personal                            history of colon cancer - presented in March 2021                            with spontaneous bowel perforatin due to rectal                            cancer, had surgery to remove that with left sided                            colostomy, course complicated by another bowel                            perforation leading to addition left colon resectin                            and transverse colostomy, no prior colonoscopy Medicines:                Monitored Anesthesia Care Procedure:                Pre-Anesthesia Assessment:                           - Prior to the procedure, a History and Physical                            was performed, and patient medications and                            allergies were reviewed. The patient's tolerance of                            previous anesthesia was also reviewed. The risks                            and benefits of the procedure and the sedation                            options and risks were discussed with the patient.                            All questions were answered, and informed consent                            was obtained. Prior Anticoagulants: The patient has  taken no previous anticoagulant or antiplatelet                            agents. ASA Grade Assessment: III - A patient with                            severe systemic disease. After reviewing the risks                            and benefits, the patient was deemed in                            satisfactory condition to undergo the procedure.                           After  obtaining informed consent, the colonoscope                            was passed under direct vision. Throughout the                            procedure, the patient's blood pressure, pulse, and                            oxygen saturations were monitored continuously. The                            Olympus [id number]) Colonoscope was                            introduced through the transverse colostomy and                            advanced to the the terminal ileum, with                            identification of the appendiceal orifice and IC                            valve. The colonoscopy was technically difficult                            and complex due to post-surgical anatomy. The                            patient tolerated the procedure well. The quality                            of the bowel preparation was fair. The terminal                            ileum, the ileocecal valve and the appendiceal  orifice were photographed. Scope In: 2:32:29 PM Scope Out: 3:00:25 PM Scope Withdrawal Time: 0 hours 21 minutes 58 seconds  Total Procedure Duration: 0 hours 27 minutes 56 seconds  Findings:                 A polypoid lesion was found at the appendiceal                            orifice. The lesion was sessile and obliterating                            the AO. Hard to see the margins of it but perhaps 1                            cm in size or so. It was superficially ulcerated                            and friable. Biopsies were taken with a cold                            forceps for histology.                           The terminal ileum appeared normal.                           Two sessile polyps were found in the cecum. The                            polyps were large, roughly 15 to 25 mm in size.                            They were not removed today pending pathology                            results from appendiceal orifice  lesion. In                            addition, the right colon did not retain air well,                            and bowel prep was only fair there, visualization                            was difficult. If these lesions are ultimately                            removed endoscopically would favor performing EMR                            at the hospital.                           A 5 mm polyp was  found in the ileocecal valve. The                            polyp was sessile. The polyp was removed with a                            cold snare. Resection and retrieval were complete.                           A 3 mm polyp was found in the transverse colon. The                            polyp was sessile. The polyp was removed with a                            cold snare. Resection and retrieval were complete.                           There was residual stool in the right colon,                            difficult to completely lavage but mostly adequate                            views obtained. There was difficult time retaining                            air in the right colon due to colostomy which                            prolonged this exam. 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 The exam was otherwise without                            abnormality. Complications:            No immediate complications. Estimated blood loss:                            Minimal. Estimated Blood Loss:     Estimated blood loss was minimal. Impression:               - Preparation of the colon was fair, lavage                            performed with mostly adequate views.                           - Polypoid lesion obliterating appendiceal orifice.  Biopsied to evaluate for malignancy.                           - The examined portion of the ileum was normal.                            - Two large polyps in the cecum, not removed today                            pending path at appendiceal orifice, bowel prep. If                            removed endoscopically in the future would favor                            EMR to be done at the hospital.                           - One 5 mm polyp at the ileocecal valve, removed                            with a cold snare. Resected and retrieved.                           - One 3 mm polyp in the transverse colon, removed                            with a cold snare. Resected and retrieved.                           - Parastomal hernia making initial entry to the                            distal colon challenging.                           - The examination was otherwise normal. Recommendation:           - Patient has a contact number available for                            emergencies. The signs and symptoms of potential                            delayed complications were discussed with the                            patient. Return to normal activities tomorrow.                            Written discharge instructions were provided to the                            patient.                           -  Resume previous diet.                           - Continue present medications.                           - Await pathology results with further                            recommendations. Remo Lipps P. Nikeshia Keetch, MD 01/06/2021 3:13:48 PM This report has been signed electronically.

## 2021-01-06 NOTE — Progress Notes (Signed)
Medical history reviewed with no changes noted. VS assessed by J.K, RN

## 2021-01-06 NOTE — Progress Notes (Signed)
Report to PACU, RN, vss, BBS= Clear.  

## 2021-01-07 ENCOUNTER — Other Ambulatory Visit: Payer: Self-pay

## 2021-01-07 DIAGNOSIS — Z85038 Personal history of other malignant neoplasm of large intestine: Secondary | ICD-10-CM

## 2021-01-08 ENCOUNTER — Telehealth: Payer: Self-pay | Admitting: *Deleted

## 2021-01-08 NOTE — Telephone Encounter (Signed)
  Follow up Call-  Call back number 01/06/2021  Post procedure Call Back phone  # [phone]  Permission to leave phone message Yes  Some recent data might be hidden     Patient questions:  Do you have a fever, pain , or abdominal swelling? No. Pain Score  0 *  Have you tolerated food without any problems? Yes.    Have you been able to return to your normal activities? Yes.    Do you have any questions about your discharge instructions: Diet   No. Medications  No. Follow up visit  No.  Do you have questions or concerns about your Care? No.  Actions: * If pain score is 4 or above: No action needed, pain <4.  1. Have you developed a fever since your procedure? no  2.   Have you had an respiratory symptoms (SOB or cough) since your procedure? no  3.   Have you tested positive for COVID 19 since your procedure no  4.   Have you had any family members/close contacts diagnosed with the COVID 19 since your procedure?  no   If yes to any of these questions please route to Joylene John, RN and Joella Prince, RN

## 2021-01-09 ENCOUNTER — Telehealth: Payer: Self-pay | Admitting: Dermatology

## 2021-01-09 NOTE — Telephone Encounter (Signed)
Referral from Hessie Dibble, Lorenza Chick; doesn't want to wait until June. Please contact referrer to see if they can get her in elsewhere sooner.

## 2021-01-10 ENCOUNTER — Other Ambulatory Visit: Payer: Self-pay

## 2021-01-10 ENCOUNTER — Ambulatory Visit (INDEPENDENT_AMBULATORY_CARE_PROVIDER_SITE_OTHER): Payer: Medicare HMO

## 2021-01-10 DIAGNOSIS — Z933 Colostomy status: Secondary | ICD-10-CM | POA: Diagnosis not present

## 2021-01-10 DIAGNOSIS — Z23 Encounter for immunization: Secondary | ICD-10-CM

## 2021-01-11 DIAGNOSIS — Z933 Colostomy status: Secondary | ICD-10-CM | POA: Diagnosis not present

## 2021-01-12 DIAGNOSIS — Z933 Colostomy status: Secondary | ICD-10-CM | POA: Diagnosis not present

## 2021-01-13 DIAGNOSIS — Z933 Colostomy status: Secondary | ICD-10-CM | POA: Diagnosis not present

## 2021-01-13 NOTE — Telephone Encounter (Signed)
Notes documented in referral and routed back to referring office.

## 2021-01-14 ENCOUNTER — Other Ambulatory Visit: Payer: Self-pay

## 2021-01-14 ENCOUNTER — Ambulatory Visit (HOSPITAL_COMMUNITY)
Admission: RE | Admit: 2021-01-14 | Discharge: 2021-01-14 | Disposition: A | Discharge status: Home / Self Care | Payer: Medicare HMO | Source: Ambulatory Visit | Attending: Gastroenterology | Admitting: Gastroenterology

## 2021-01-14 DIAGNOSIS — K639 Disease of intestine, unspecified: Secondary | ICD-10-CM | POA: Diagnosis not present

## 2021-01-14 DIAGNOSIS — J432 Centrilobular emphysema: Secondary | ICD-10-CM | POA: Diagnosis not present

## 2021-01-14 DIAGNOSIS — Z85038 Personal history of other malignant neoplasm of large intestine: Secondary | ICD-10-CM

## 2021-01-14 DIAGNOSIS — Z933 Colostomy status: Secondary | ICD-10-CM | POA: Diagnosis not present

## 2021-01-14 DIAGNOSIS — J9811 Atelectasis: Secondary | ICD-10-CM | POA: Diagnosis not present

## 2021-01-14 DIAGNOSIS — C189 Malignant neoplasm of colon, unspecified: Secondary | ICD-10-CM | POA: Diagnosis not present

## 2021-01-14 DIAGNOSIS — I700 Atherosclerosis of aorta: Secondary | ICD-10-CM | POA: Diagnosis not present

## 2021-01-14 DIAGNOSIS — M47814 Spondylosis without myelopathy or radiculopathy, thoracic region: Secondary | ICD-10-CM | POA: Diagnosis not present

## 2021-01-14 LAB — POCT I-STAT CREATININE: Creatinine, Ser: 0.9 mg/dL (ref 0.44–1.00)

## 2021-01-14 MED ORDER — IOHEXOL 300 MG/ML  SOLN
100.0000 mL | Freq: Once | INTRAMUSCULAR | Status: AC | PRN
  Administered 2021-01-14: 100 mL via INTRAVENOUS

## 2021-01-15 ENCOUNTER — Other Ambulatory Visit: Payer: Self-pay

## 2021-01-15 ENCOUNTER — Inpatient Hospital Stay: Payer: Medicare HMO | Attending: Hematology | Admitting: Nurse Practitioner

## 2021-01-15 ENCOUNTER — Encounter: Payer: Self-pay | Admitting: Nurse Practitioner

## 2021-01-15 ENCOUNTER — Inpatient Hospital Stay: Payer: Medicare HMO

## 2021-01-15 VITALS — BP 133/63 | HR 87 | Temp 97.9°F | Resp 19 | Ht 62.0 in | Wt 128.9 lb

## 2021-01-15 DIAGNOSIS — I10 Essential (primary) hypertension: Secondary | ICD-10-CM | POA: Insufficient documentation

## 2021-01-15 DIAGNOSIS — Z79899 Other long term (current) drug therapy: Secondary | ICD-10-CM | POA: Insufficient documentation

## 2021-01-15 DIAGNOSIS — C20 Malignant neoplasm of rectum: Secondary | ICD-10-CM

## 2021-01-15 DIAGNOSIS — J439 Emphysema, unspecified: Secondary | ICD-10-CM | POA: Insufficient documentation

## 2021-01-15 DIAGNOSIS — C19 Malignant neoplasm of rectosigmoid junction: Secondary | ICD-10-CM | POA: Diagnosis present

## 2021-01-15 DIAGNOSIS — F419 Anxiety disorder, unspecified: Secondary | ICD-10-CM | POA: Insufficient documentation

## 2021-01-15 DIAGNOSIS — C181 Malignant neoplasm of appendix: Secondary | ICD-10-CM | POA: Insufficient documentation

## 2021-01-15 DIAGNOSIS — R69 Illness, unspecified: Secondary | ICD-10-CM | POA: Diagnosis not present

## 2021-01-15 DIAGNOSIS — Z86718 Personal history of other venous thrombosis and embolism: Secondary | ICD-10-CM | POA: Diagnosis not present

## 2021-01-15 DIAGNOSIS — Z933 Colostomy status: Secondary | ICD-10-CM | POA: Diagnosis not present

## 2021-01-15 DIAGNOSIS — Z930 Tracheostomy status: Secondary | ICD-10-CM | POA: Insufficient documentation

## 2021-01-15 DIAGNOSIS — M8580 Other specified disorders of bone density and structure, unspecified site: Secondary | ICD-10-CM | POA: Diagnosis not present

## 2021-01-15 LAB — CMP (CANCER CENTER ONLY)
ALT: 20 U/L (ref 0–44)
AST: 26 U/L (ref 15–41)
Albumin: 4.3 g/dL (ref 3.5–5.0)
Alkaline Phosphatase: 83 U/L (ref 38–126)
Anion gap: 10 (ref 5–15)
BUN: 18 mg/dL (ref 8–23)
CO2: 26 mmol/L (ref 22–32)
Calcium: 9.4 mg/dL (ref 8.9–10.3)
Chloride: 104 mmol/L (ref 98–111)
Creatinine: 0.89 mg/dL (ref 0.44–1.00)
GFR, Estimated: >60 mL/min (ref >60)
Glucose, Bld: 110 mg/dL — ABNORMAL HIGH (ref 70–99)
Potassium: 4.3 mmol/L (ref 3.5–5.1)
Sodium: 140 mmol/L (ref 135–145)
Total Bilirubin: 0.7 mg/dL (ref 0.3–1.2)
Total Protein: 7.1 g/dL (ref 6.5–8.1)

## 2021-01-15 LAB — CBC WITH DIFFERENTIAL (CANCER CENTER ONLY)
Abs Immature Granulocytes: 0.01 10*3/uL (ref 0.00–0.07)
Basophils Absolute: 0.1 10*3/uL (ref 0.0–0.1)
Basophils Relative: 1 %
Eosinophils Absolute: 0.1 10*3/uL (ref 0.0–0.5)
Eosinophils Relative: 1 %
HCT: 43.2 % (ref 36.0–46.0)
Hemoglobin: 13.7 g/dL (ref 12.0–15.0)
Immature Granulocytes: 0 %
Lymphocytes Relative: 25 %
Lymphs Abs: 1.9 10*3/uL (ref 0.7–4.0)
MCH: 31.2 pg (ref 26.0–34.0)
MCHC: 31.7 g/dL (ref 30.0–36.0)
MCV: 98.4 fL (ref 80.0–100.0)
Monocytes Absolute: 0.6 10*3/uL (ref 0.1–1.0)
Monocytes Relative: 7 %
Neutro Abs: 5 10*3/uL (ref 1.7–7.7)
Neutrophils Relative %: 66 %
Platelet Count: 276 10*3/uL (ref 150–400)
RBC: 4.39 MIL/uL (ref 3.87–5.11)
RDW: 12.6 % (ref 11.5–15.5)
WBC Count: 7.6 10*3/uL (ref 4.0–10.5)
nRBC: 0 % (ref 0.0–0.2)

## 2021-01-15 NOTE — Progress Notes (Signed)
The proposed treatment discussed in conference is for discussion purposes only and is not a binding recommendation.  The patients have not been physically examined, or presented with their treatment options.  Therefore, final treatment plans cannot be decided.   

## 2021-01-15 NOTE — Progress Notes (Addendum)
[place]   Telephone:([phone] Fax:([phone]   Clinic Follow up Note   Patient Care Team: [name], DO as PCP - General (Family Medicine) Jonnie Finner, RN as Oncology Nurse Navigator [name], MD as Consulting Physician (Hematology) Icard, Octavio Graves, DO as Consulting Physician (Pulmonary Disease) [place] [facility] [name], MD as Consulting Physician (General Surgery) [name], MD as Consulting Physician (General Surgery) [name], [name], MD as Consulting Physician (Gastroenterology) 01/15/2021  CHIEF COMPLAINT: Follow up new appendiceal cancer   SUMMARY OF ONCOLOGIC HISTORY: Oncology History  Rectal cancer [facility])  03/03/2020 - 04/05/2020 Hospital Admission   She was admitted to ED on 03/03/20 for abdominal pain and nausea. She had been having diarrhea for several weeks. During hospital stay she developed acute respiratory failure, AKI, RUE DVT from PICC line. Work up showed bowel perforation, small left liver mass and thickening of jejunum. She underwent emergent surgery on 03/03/20 for resection and colostomy placement. Her path showed invasive cancer, metastatic to 3/15 LNs. She had a NGT in placed but this was removed on POD 3. Post op her stoma became necrotic and septic. She had another bowel surgery on 03/12/20, which was NED with necrotic tissue.    03/03/2020 Imaging   CT AP 03/03/20  IMPRESSION: 1. Free intraperitoneal air consistent perforation. Most likely site of perforation is in the distal splenic flexure/proximal descending colon where there is a collection of air and gas measuring 6 centimeters. No obvious soft tissue mass identified in this region. At this site, there is abrupt transition of dilated, stool-filled colon to completely decompressed proximal descending colon. 2. Thickened, inflamed loops of jejunum are identified within the pelvis and are likely reactive. 3. Small hiatal hernia. 4.  Benign-appearing 1.6 centimeter mass the LEFT hepatic lobe. Recommend comparison with prior studies if available. 5.  Emphysema (ICD10-J43.9). 6.  Aortic Atherosclerosis (ICD10-I70.0). 7. Bilateral renal scarring.   03/03/2020 Surgery   low anterior resection end colostomy by Dr Marcello Moores    03/03/2020 Initial Biopsy   FINAL MICROSCOPIC DIAGNOSIS: 03/03/20 A. RECTOSIGMOID COLON, LOW ANTERIOR RESECTION:  - Invasive colonic adenocarcinoma, 3.5 cm.  - Tumor invades the visceral peritoneum.  - Margins of resection are not involved.  - Metastatic carcinoma in (3) of (15) lymph nodes.  - See oncology table.   B. ADDITIONAL SIGMOID COLON, RESECTION:  - Colonic tissue, negative for carcinoma.  ADDENDUM:  Mismatch Repair Protein (IHC)   SUMMARY INTERPRETATION: NORMAL  There is preserved expression of the major MMR proteins. There is a very  low probability that microsatellite instability (MSI) is present.  However, certain clinically significant MMR protein mutations may result  in preservation of nuclear expression. It is recommended that the  preservation of protein expression be correlated with molecular based  MSI testing.   IHC EXPRESSION RESULTS  TEST           RESULT  MLH1:          Preserved nuclear expression  MSH2:          Preserved nuclear expression  MSH6:          Preserved nuclear expression  PMS2:          Preserved nuclear expression   03/08/2020 Imaging   CT AP 03/08/20 IMPRESSION: 1. Interval midline laparotomy with distal colon resection and diverting left lower quadrant colostomy. 2. Diffuse small bowel dilatation with gas fluid levels most consistent with postoperative ileus. 3. Trace ascites within the  abdomen and pelvis. No fluid collection or abscess at this time. Surgical drain within the lower pelvis. 4. Indeterminate 1.4 cm subcapsular liver hypodensity. In light of newly diagnosed rectal cancer, metastatic disease cannot be excluded. PET CT may be useful  for further evaluation. 5. Interval development of trace bilateral pleural effusions and diffuse body wall edema.   03/12/2020 Surgery   EXPLORATORY LAPAROTOMY WITH BOWEL RESECTION AND COLOSTOMY by Dr Hassell Done 03/12/20  FINAL MICROSCOPIC DIAGNOSIS: 03/12/20  A. COLON, SPLENIC FLEXURE, RESECTION:  - Segment of colon (37 cm) with perforation and associated inflammation  - Multiple mucosal ulcers with necrotizing inflammation  - No evidence of malignancy    03/12/2020 Imaging   CT AP WO contrast 03/12/20  IMPRESSION: 1. Exam is limited by lack of intravenous contrast, motion, and artifact from the patient's arms adjacent to the torso. Since 03/08/2020, there has been development of relatively large pockets of intraperitoneal free air. While intraperitoneal gas would not be unexpected on postoperative day 9, this gas is new since an intervening study of 03/08/2020 and given the relatively large volume certainly raises concern for bowel perforation. No source for the intraperitoneal free air is evident on this study. There is a surgical drain in the pelvis, but there is no free gas around the drain itself to suggest that it represents the source. 2. New circumferential wall thickening in the splenic flexure, descending colon and sigmoid colon leading into the end colostomy. Infection/inflammation would be a consideration. Ischemia cannot be excluded. 3. Relatively small volume intraperitoneal free fluid. High attenuation small fluid collections in the left upper abdomen may reflect hemorrhage, infection or residua from prior perforation. 4. Interval progression of diffuse body wall edema. 5. Residual contrast material in the renal parenchyma from prior imaging, compatible with renal dysfunction.    03/19/2020 Imaging   CT CAP 03/19/20  IMPRESSION: 1. 5.5 x 3.2 cm fluid collection is noted along the greater curvature of the proximal stomach. 2. Surgical drain is again noted in the pelvis  with tip in left lower quadrant. 3. Interval development of crescent-shaped fluid collection measuring 16.4 x 3.3 cm in the epigastric region and left upper quadrant of the abdomen which may extend into the left lower quadrant. Potentially this may represent abscess or developing abscess. Multiple other smaller fluid collections are noted which may represent small abscesses. 4. 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 5. Mild amount of free fluid is noted in the posterior pelvis. 6. Moderate anasarca is noted. Aortic Atherosclerosis (ICD10-I70.0).   04/12/2020 Imaging   CT AP 04/12/20  IMPRESSION: 1. Fluid collections within the LEFT abdomen have significantly decreased compared to previous CT exams, now nearly completely resolved. 2. Percutaneous drainage catheter with tip coiled posterior to the LEFT kidney, stable positioning compared to the previous study. The more anterior catheter has been removed. 3. Small amount of free fluid persists within the abdomen and pelvis. 4. Trace bibasilar pleural effusions. 5. Anasarca. 6. While reviewing today's study, comparing with a chest/abdomen/pelvis CT from earlier same month, there is question of thrombus in the LEFT internal jugular vein. Recommend ultrasound of the LEFT IJ to exclude DVT. This recommendation discussed with patient's hospitalist, Dr. Owens Shark, on 04/12/2020 at 4:20 p.m.   Emphysema (ICD10-J43.9).   04/22/2020 Imaging   CT AP 04/22/20  IMPRESSION: 1. No recurrent intra-abdominal abscess status post interval removal of left retroperitoneal percutaneous drain. Stable small amount of free pelvic fluid. 2. Enlarging dependent bilateral pleural effusions, now moderate in volume.  Associated atelectasis at both lung bases. 3. Progressive anasarca with generalized edema throughout the subcutaneous fat. 4. Stable small subcapsular fluid collection along the anterior aspect of the left hepatic lobe. 5. Aortic  Atherosclerosis (ICD10-I70.0).   05/23/2020 Initial Diagnosis   Rectal cancer ([place])   01/14/2021 Imaging   CT CAP IMPRESSION: -5.0 cm soft tissue mass along the posterior aspect of the cecum at the base of the appendix, favored to reflect a peritoneal/serosal implant, corresponding to the patient's known adenocarcinoma. This is new from the prior. -Suspected additional pelvic implants along the uterus and right adnexa, poorly visualized, new/progressive from the prior. -Additional pericapsular lesion along the lateral spleen is mildly progressive, suspicious for additional peritoneal implant. -No evidence of metastatic disease in the chest.   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 - Polypoid lesion obliterating appendiceal orifice. Biopsied to evaluate for malignancy. - The examined portion of the ileum was normal. - Two large polyps in the cecum, not removed today pending path at appendiceal orifice, bowel prep. If removed endoscopically in the future would favor EMR to be done at the hospital. - One 5 mm polyp at the ileocecal valve, removed with a cold snare. Resected and retrieved. - One 3 mm polyp in the transverse colon, removed with a cold snare. Resected and retrieved. - Parastomal hernia making initial entry to the distal colon challenging. - The examination was otherwise normal.   01/06/2021 Initial Biopsy   Diagnosis 1. Colon, biopsy, appendiceal oraface - ADENOCARCINOMA. 2. Colon, polyp(s), ileocecal valve, transverse, x2 - TUBULAR ADENOMA, NEGATIVE FOR HIGH GRADE DYSPLASIA (X1). - COLONIC MUCOSA WITH UNDERLYING LYMPHOID AGGREGATE, NEGATIVE FOR DYSPLASIA (X1).  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 This is new from the prior. -Suspected additional pelvic implants along the uterus and right adnexa, poorly visualized, new/progressive from the prior. -Additional pericapsular lesion along the lateral spleen is mildly progressive, suspicious for additional peritoneal implant. -No evidence of metastatic disease in the chest.     CURRENT THERAPY: PENDING  INTERVAL HISTORY: Ms. Bark returns for follow up. She was last seen for routine surveillance viist in 11/2020. She underwent surveillance colonoscopy by Dr. Havery Moros 01/06/21 that showed several polyps throughout the colon, one in particular over the appendiceal orifice concerning for malignancy. Path of the appendiceal orifice confirmed adenocarcinoma, others were tubular adenomas.  Staging CT CAP unfortunately showed a 5 cm mass along posterior aspect of the cecum at base of the appendix favored to reflect peritoneal/serosal implant as well as additional pelvic implants along the uterus, right adnexa and lateral spleen. No distant metastasis in the chest.   Today she presents with her husband. She is [name] well in general. Appetite is adequate, no recent weight loss. Activity level is decent, able to do ADLs at home with rest but no exercise. Bowels moving normally per colostomy, keeps stools soft with colace and prunes. Denies n/v or abdominal pain. She has bloating from a large hernia. Exertional dyspnea is stable, no cough, chest pain, leg edema, fever, chills, or other concerns.    MEDICAL HISTORY:  Past Medical History:  Diagnosis Date  . Acute deep vein thrombosis (DVT) of brachial vein of right upper extremity ([place])   . Acute on chronic respiratory failure with hypoxia ([place])   . Asthma   . Cataract   . [place], HX OF 01/06/2011   Qualifier: Diagnosis of   By: Charlett Blake MD, Erline Levine    . COPD (chronic obstructive pulmonary disease) ([place]) 2011   FeV1 31% predicted FeV1/FVX 47 %  . Essential hypertension 05/23/2020  . Hemorrhoid   . Open right radial fracture 2020  . Osteopenia   . Perforated sigmoid colon ([place]) 03/03/2020  . Pleural effusion   . Pneumonia 2021  . Postoperative intra-abdominal abscess 2021  . Rectal cancer ([place])   . Seasonal allergies    takes Claritin daily prn  . Shock circulatory ([place])   . Tracheostomy status ([place])   . Vitamin D deficiency    takes Vit d every 14 days    SURGICAL HISTORY: Past Surgical History:  Procedure Laterality Date  . AUGMENTATION MAMMAPLASTY     saline  . EXAMINATION UNDER ANESTHESIA  10/14/2012   Procedure: EXAM UNDER ANESTHESIA;  Surgeon: Gayland Curry, MD,FACS;  Location: [facility];  Service: General;  Laterality: N/A;  rectal exam under anesthesia excisional hemorrhoidectomy hemorrhoidal banding x two  . EXAMINATION UNDER ANESTHESIA  02/03/2013   excision hemorrhoidal tissue  . FOOT SURGERY     left bunionectomy  . HEMORRHOIDECTOMY WITH HEMORRHOID BANDING  10/14/2012   Procedure: HEMORRHOIDECTOMY WITH HEMORRHOID BANDING;  Surgeon: Gayland Curry, MD,FACS;  Location: [facility];  Service: General;  Laterality: N/A;  . IR THORACENTESIS ASP PLEURAL SPACE W/IMG GUIDE  04/23/2020  . LAPAROTOMY N/A 03/03/2020   Procedure: low anterior resection end colostomy;  Surgeon: [name], MD;  Location: [facility];  Service: General;  Laterality: N/A;  . LAPAROTOMY N/A 03/12/2020   Procedure: EXPLORATORY LAPAROTOMY WITH BOWEL RESECTION AND COLOSTOMY;  Surgeon: [name], MD;  Location: [facility];  Service: General;  Laterality: N/A;  . OPEN REDUCTION INTERNAL FIXATION (ORIF) DISTAL RADIAL FRACTURE Right 11/01/2018   Procedure: OPEN REDUCTION INTERNAL FIXATION (ORIF) DISTAL RADIAL FRACTURE;  Surgeon: [name], MD;  Location: [facility];  Service: Orthopedics;  Laterality: Right;  . TONSILLECTOMY   1960   recurrent otitis media  . US ECHOCARDIOGRAPHY  02/2020    poor windows, normal LV function, severely dilated RV with moderately reduced function, RV volume and pressure overload, mildly dilated RA    I have reviewed the social history and family history with the patient and they are unchanged from previous note.  ALLERGIES:  is allergic to amlodipine and codeine.  MEDICATIONS:  Current Outpatient Medications  Medication Sig Dispense Refill  . albuterol (PROVENTIL) (2.5 MG/3ML) 0.083% nebulizer solution Take 3 mLs (2.5 mg total) by nebulization every 4 (four) hours as needed for wheezing or shortness of breath. DX: J44.9 (Patient not taking: Reported on 01/06/2021) 360 mL 5  . albuterol (VENTOLIN HFA) 108 (90 Base) MCG/ACT inhaler Inhale 2 puffs into the lungs every 6 (six) hours as needed for wheezing or shortness of breath. 8 g 5  . ALPRAZolam (XANAX) 0.25 MG tablet Take 1 tablet (0.25 mg total) by mouth at bedtime as needed for anxiety. 30 tablet 0  . diphenhydrAMINE (BENADRYL) 25 mg capsule Take 25 mg by mouth every 6 (six) hours as needed.    . docusate sodium (COLACE) 50 MG capsule Take 50 mg by mouth daily as needed for mild constipation. (Patient not taking:  Reported on 01/06/2021)    . fluticasone (FLONASE) 50 MCG/ACT nasal spray Place 2 sprays into both nostrils daily. (Patient not taking: Reported on 01/06/2021) 16 mL 5  . Fluticasone-Umeclidin-Vilant (TRELEGY ELLIPTA) 100-62.5-25 MCG/INH AEPB Inhale 1 puff into the lungs daily. 60 each 5   No current facility-administered medications for this visit.    PHYSICAL EXAMINATION: ECOG PERFORMANCE STATUS: 1-2   Vitals:   01/15/21 1444  BP: 133/63  Pulse: 87  Resp: 19  Temp: 97.9 F (36.6 C)  SpO2: 93%   Filed Weights   01/15/21 1444  Weight: 128 lb 14.4 oz (58.5 kg)    GENERAL:alert, no distress and comfortable SKIN: no rash  EYES: sclera clear NECK: without mass LYMPH:  no palpable cervical or supraclavicular  lymphadenopathy  LUNGS: diminished, normal breathing effort HEART: regular rate & rhythm, no lower extremity edema ABDOMEN:abdomen soft, non-tender and normal bowel sounds. Incision healed well with scar tissue. Left abdominal colostomy with brown stool in collection bag with LLQ hernia   NEURO: alert & oriented x 3 with fluent speech, no focal motor/sensory deficits   LABORATORY DATA:  I have reviewed the data as listed CBC Latest Ref Rng & Units 01/15/2021 11/25/2020 08/27/2020  WBC 4.0 - 10.5 K/uL 7.6 7.8 7.0  Hemoglobin 12.0 - 15.0 g/dL 13.7 13.5 14.6  Hematocrit 36.0 - 46.0 % 43.2 41.6 45.8  Platelets 150 - 400 K/uL 276 372 328     CMP Latest Ref Rng & Units 01/14/2021 11/25/2020 08/27/2020  Glucose 70 - 99 mg/dL - 86 90  BUN 8 - 23 mg/dL - 12 16  Creatinine 0.44 - 1.00 mg/dL 0.90 1.00 0.91  Sodium 135 - 145 mmol/L - 139 141  Potassium 3.5 - 5.1 mmol/L - 4.4 4.5  Chloride 98 - 111 mmol/L - 105 104  CO2 22 - 32 mmol/L - 27 28  Calcium 8.9 - 10.3 mg/dL - 9.6 10.0  Total Protein 6.5 - 8.1 g/dL - 7.2 7.8  Total Bilirubin 0.3 - 1.2 mg/dL - 0.4 0.4  Alkaline Phos 38 - 126 U/L - 105 123  AST 15 - 41 U/L - 25 23  ALT 0 - 44 U/L - 17 17      RADIOGRAPHIC STUDIES: I have personally reviewed the radiological images as listed and agreed with the findings in the report. CT CHEST ABDOMEN PELVIS W CONTRAST  Result Date: 01/14/2021 CLINICAL DATA:  Rectosigmoid colon cancer, status post low anterior 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 diagnosed adenocarcinoma at the appendiceal orifice. EXAM: CT CHEST, ABDOMEN, AND PELVIS WITH CONTRAST TECHNIQUE: Multidetector CT imaging of the chest, abdomen and pelvis was performed following the standard protocol during bolus administration of intravenous contrast. CONTRAST:  183m OMNIPAQUE IOHEXOL 300 MG/ML  SOLN COMPARISON:  CT abdomen/pelvis dated 08/27/2020 FINDINGS: CT CHEST FINDINGS Cardiovascular: The heart is normal in size. No  pericardial effusion. No evidence of thoracic aortic aneurysm. Atherosclerotic calcifications of the aortic arch. Mediastinum/Nodes: No suspicious mediastinal lymphadenopathy. Visualized thyroid is unremarkable. Lungs/Pleura: Moderate centrilobular and paraseptal emphysematous changes, upper lung predominant. No suspicious pulmonary nodules. Mild scarring/atelectasis in the right middle lobe and lingula. No focal consolidation. No pleural effusion or pneumothorax. Musculoskeletal: Bilateral breast augmentation. Mild degenerative changes of the lower thoracic spine. CT ABDOMEN PELVIS FINDINGS Hepatobiliary: Probable subcapsular cyst anteriorly in segment 4 (series 2/image 60). Gallbladder is unremarkable. No intrahepatic or extrahepatic ductal dilatation. Pancreas: Within normal limits. Spleen: Spleen is within normal limits. However, there is a 4.0  x 2.0 cm pericapsular lesion along the lateral spleen beneath the left hemidiaphragm (series 2/image 58), previously 3.0 x 1.3 cm when measured in a similar fashion, favored to be progressive. Adrenals/Urinary Tract: Adrenal glands are within normal limits. Kidneys are within normal limits.  No hydronephrosis. Bladder is within normal limits. Stomach/Bowel: Stomach is within normal limits. Status post low anterior resection with left mid abdominal colostomy. 2.5 x 5.0 cm soft tissue mass along the posterior aspect of the cecum (series 2/image 98), essentially new from the prior, favored to reflect a peritoneal/serosal implant. Dilated mid/distal appendix in the left pelvis (series 2/image 97), measuring up to 18 mm in diameter, new from the prior. Vascular/Lymphatic: No evidence of abdominal aortic aneurysm. Atherosclerotic calcifications of the abdominal aorta and branch vessels. No suspicious abdominopelvic lymphadenopathy. Reproductive: Soft tissue mass along the posterior cecum noted above abuts the anterior aspect of the uterus. Additional soft tissue nodularity  along the uterine fundus (series 2/image 94) and posteriorly along the right cervix (series 2/image 101), favored to reflect peritoneal implants in the pelvic cul-de-sac. Mild soft tissue prominence along the right adnexa (series 2/image 97), progressive, also raising concern for a pelvic implant. Other: No abdominopelvic ascites. Diastasis of the midline anterior abdominal wall containing multiple loops of small bowel and a loop of mid transverse colon, unchanged. Musculoskeletal: Lumbar spine is within normal limits, noting grade 1 anterolisthesis of L4 on L5. IMPRESSION: 5.0 cm soft tissue mass along the posterior aspect of the cecum at the base of the appendix, favored to reflect a peritoneal/serosal implant, corresponding to the patient's known adenocarcinoma. This is new from the prior. Suspected additional pelvic implants along the uterus and right adnexa, poorly visualized, new/progressive from the prior. Additional pericapsular lesion along the lateral spleen is mildly progressive, suspicious for additional peritoneal implant. No evidence of metastatic disease in the chest. Electronically Signed   By: Julian Hy M.D.   On: 01/14/2021 16:51     ASSESSMENT & PLAN: 70 yo female   1. 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 Havery Moros on 01/06/21 showed several polyps throughout the colon, one obliterating the appendiceal orifice. Path confirmed adenocarcinoma  -staging work up shows 5.0 cm mass along the posterior aspect of the cecum at the base of the appendix, likely peritoneal/serosal implant, and suspected additional peritoneal/pelvic implants along the uterus, right adnexa, and spleen c/w metastatic disease. No distant metastatic disease in the chest.  -The case was discussed in GI tumor board, this is likely second primary with metastatic disease vs recurrent metastatic rectal cancer.  -We discussed  biopsy of the peritoneum is difficult, we are referring her for PET scan.  -if these areas are PET positive, we will discuss with IR if biopsy is feasible, but may not be necessary -If confirmed metastatic disease, Dr. Marcello Moores would not offer surgery, and therefore her condition is not curable but still treatable. Primary treatment would be chemotherapy  -we briefly discussed single agent Xeloda. Her PS has improved and she is stronger now than after rectal cancer surgery and may be able to tolerate more intensive chemo. The goal would be palliative, for disease control, to improve QOL and prolong her life. She is interested  -we will obtain FO molecular studies to determine if she is a candidate for targeted therapy in addition to chemo.  -Given this new finding, she is a candidate for genetic testing to r/o mutation, she is interested and I  referred her.  -She agrees to baseline labs today, CBC is normal. CMP, CEA, and iron studies pending.  -we will call her after PET scan   2. [name], MMR normal  -diagnosed 02/2020, due to perforation underwent emergent surgery. Path showed invasive adenocarcinoma with perforation, +3/15 LNs, and clear margins. Initial CT scan negative for distant metastasis.  -due to bowel perforation and + LNs she has very high risk for recurrence, unfortunately she was not a candidate for adjuvant chemo due to very slow recovery from surgery -Guardant Reveal ctDNA were all negative x3 (06/06/20, 07/12/20, and 09/10/20).  -surveillance CT 08/27/20 showed a small density adjacent to the spleen that had decreased, no other evidence of recurrent or metastatic disease  -first surveillance colonoscopy by Dr. Havery Moros 01/06/21 showed new adenocarcinoma at appendiceal orifice and other tubular adenomas throughout the colon  -staging work up 01/14/21 concerning for peritoneal spread. PET scan pending   3. H/o provoked RUE DVT 04/04/20 s/p 3 months AC  (Eliquis)  4. Comorbidities: Asthma, COPD, anxiety -on nebulizer and trelegy, has not needed rescue inhaler recently  -f/up pulmonology and PCP    PLAN: -colonoscopy, path, and imaging reviewed  -lab today and PET scan in the next week, will call with results  -referrals to dietician and genetics  -OK to cancel f/up with Dr. Marcello Moores, we will take care of that -request FO    Orders Placed This Encounter  Procedures  . NM PET Image Initial (PI) Skull Base To Thigh    Standing Status:   Future    Standing Expiration Date:   01/15/2022    Order Specific Question:   If indicated for the ordered procedure, I authorize the administration of a radiopharmaceutical per Radiology protocol    Answer:   Yes    Order Specific Question:   Preferred imaging location?    Answer:   [facility]  . Amb Referral to Nutrition and Diabetic Education    Referral Priority:   Routine    Referral Type:   Consultation    Referral Reason:   Specialty Services Required    Number of Visits Requested:   1  . Ambulatory referral to Genetics    Referral Priority:   Routine    Referral Type:   Consultation    Referral Reason:   Specialty Services Required    Number of Visits Requested:   1   All questions were answered. The patient knows to call the clinic with any problems, questions or concerns. No barriers to learning were detected.     Tracy Feeling, NP 01/15/21   Addendum  I have seen the patient, examined her. I agree with the assessment and and plan and have edited the notes.   Tracy [name] is clinically doing well, no new symptoms. I have personally reviewed her recent CT scan images and colonoscopy findings. I have presented her case in GI tumor board. 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 Plan to get  PET, then will discuss if biopsy is feasible. We also discussed treatment plan for metastatic colon cancer.  Due to the likely metastatic cancer, Dr. Marcello Moores does not recommend upfront surgery . I will call her after her PET to decide next step.   [name]  01/15/2021

## 2021-01-16 DIAGNOSIS — C7880 Secondary malignant neoplasm of unspecified digestive organ: Secondary | ICD-10-CM | POA: Diagnosis not present

## 2021-01-16 DIAGNOSIS — C210 Malignant neoplasm of anus, unspecified: Secondary | ICD-10-CM | POA: Diagnosis not present

## 2021-01-16 DIAGNOSIS — C7889 Secondary malignant neoplasm of other digestive organs: Secondary | ICD-10-CM | POA: Diagnosis not present

## 2021-01-16 DIAGNOSIS — C19 Malignant neoplasm of rectosigmoid junction: Secondary | ICD-10-CM | POA: Diagnosis not present

## 2021-01-16 DIAGNOSIS — I739 Peripheral vascular disease, unspecified: Secondary | ICD-10-CM | POA: Diagnosis not present

## 2021-01-16 DIAGNOSIS — C20 Malignant neoplasm of rectum: Secondary | ICD-10-CM | POA: Diagnosis not present

## 2021-01-16 DIAGNOSIS — Z008 Encounter for other general examination: Secondary | ICD-10-CM | POA: Diagnosis not present

## 2021-01-16 DIAGNOSIS — J439 Emphysema, unspecified: Secondary | ICD-10-CM | POA: Diagnosis not present

## 2021-01-16 DIAGNOSIS — C785 Secondary malignant neoplasm of large intestine and rectum: Secondary | ICD-10-CM | POA: Diagnosis not present

## 2021-01-16 DIAGNOSIS — C7960 Secondary malignant neoplasm of unspecified ovary: Secondary | ICD-10-CM | POA: Diagnosis not present

## 2021-01-16 DIAGNOSIS — Z933 Colostomy status: Secondary | ICD-10-CM | POA: Diagnosis not present

## 2021-01-16 LAB — IRON AND TIBC
Iron: 70 ug/dL (ref 41–142)
Saturation Ratios: 20 % — ABNORMAL LOW (ref 21–57)
TIBC: 344 ug/dL (ref 236–444)
UIBC: 274 ug/dL (ref 120–384)

## 2021-01-16 LAB — CEA (IN HOUSE-CHCC): CEA (CHCC-In House): 5.29 ng/mL — ABNORMAL HIGH (ref 0.00–5.00)

## 2021-01-16 LAB — FERRITIN: Ferritin: 39 ng/mL (ref 11–307)

## 2021-01-17 DIAGNOSIS — Z933 Colostomy status: Secondary | ICD-10-CM | POA: Diagnosis not present

## 2021-01-18 DIAGNOSIS — Z933 Colostomy status: Secondary | ICD-10-CM | POA: Diagnosis not present

## 2021-01-19 DIAGNOSIS — Z933 Colostomy status: Secondary | ICD-10-CM | POA: Diagnosis not present

## 2021-01-20 ENCOUNTER — Telehealth: Payer: Self-pay | Admitting: Hematology

## 2021-01-20 DIAGNOSIS — R69 Illness, unspecified: Secondary | ICD-10-CM | POA: Diagnosis not present

## 2021-01-20 DIAGNOSIS — I82621 Acute embolism and thrombosis of deep veins of right upper extremity: Secondary | ICD-10-CM | POA: Diagnosis not present

## 2021-01-20 DIAGNOSIS — E43 Unspecified severe protein-calorie malnutrition: Secondary | ICD-10-CM | POA: Diagnosis not present

## 2021-01-20 DIAGNOSIS — R1310 Dysphagia, unspecified: Secondary | ICD-10-CM | POA: Diagnosis not present

## 2021-01-20 DIAGNOSIS — Z933 Colostomy status: Secondary | ICD-10-CM | POA: Diagnosis not present

## 2021-01-20 DIAGNOSIS — J9600 Acute respiratory failure, unspecified whether with hypoxia or hypercapnia: Secondary | ICD-10-CM | POA: Diagnosis not present

## 2021-01-20 DIAGNOSIS — M6281 Muscle weakness (generalized): Secondary | ICD-10-CM | POA: Diagnosis not present

## 2021-01-20 DIAGNOSIS — C20 Malignant neoplasm of rectum: Secondary | ICD-10-CM | POA: Diagnosis not present

## 2021-01-20 DIAGNOSIS — J449 Chronic obstructive pulmonary disease, unspecified: Secondary | ICD-10-CM | POA: Diagnosis not present

## 2021-01-20 DIAGNOSIS — J90 Pleural effusion, not elsewhere classified: Secondary | ICD-10-CM | POA: Diagnosis not present

## 2021-01-20 NOTE — Telephone Encounter (Signed)
Scheduled appointment per 2/7 sch msg. Spoke to patient who is aware of appointment date and time and that appointment is a virtual visit.

## 2021-01-21 DIAGNOSIS — Z933 Colostomy status: Secondary | ICD-10-CM | POA: Diagnosis not present

## 2021-01-22 DIAGNOSIS — Z933 Colostomy status: Secondary | ICD-10-CM | POA: Diagnosis not present

## 2021-01-23 DIAGNOSIS — Z933 Colostomy status: Secondary | ICD-10-CM | POA: Diagnosis not present

## 2021-01-24 DIAGNOSIS — Z933 Colostomy status: Secondary | ICD-10-CM | POA: Diagnosis not present

## 2021-01-27 ENCOUNTER — Other Ambulatory Visit: Payer: Self-pay

## 2021-01-27 ENCOUNTER — Ambulatory Visit (HOSPITAL_COMMUNITY)
Admission: RE | Admit: 2021-01-27 | Discharge: 2021-01-27 | Disposition: A | Discharge status: Home / Self Care | Payer: Medicare HMO | Source: Ambulatory Visit | Attending: Nurse Practitioner | Admitting: Nurse Practitioner

## 2021-01-27 DIAGNOSIS — J439 Emphysema, unspecified: Secondary | ICD-10-CM | POA: Insufficient documentation

## 2021-01-27 DIAGNOSIS — C786 Secondary malignant neoplasm of retroperitoneum and peritoneum: Secondary | ICD-10-CM | POA: Insufficient documentation

## 2021-01-27 DIAGNOSIS — I700 Atherosclerosis of aorta: Secondary | ICD-10-CM | POA: Diagnosis not present

## 2021-01-27 DIAGNOSIS — C181 Malignant neoplasm of appendix: Secondary | ICD-10-CM | POA: Diagnosis not present

## 2021-01-27 LAB — GLUCOSE, CAPILLARY: Glucose-Capillary: 105 mg/dL — ABNORMAL HIGH (ref 70–99)

## 2021-01-27 MED ORDER — FLUDEOXYGLUCOSE F - 18 (FDG) INJECTION
6.8000 | Freq: Once | INTRAVENOUS | Status: AC | PRN
  Administered 2021-01-27: 6.8 via INTRAVENOUS

## 2021-01-28 ENCOUNTER — Encounter: Payer: Self-pay | Admitting: Hematology

## 2021-01-28 ENCOUNTER — Telehealth: Payer: Self-pay | Admitting: Pharmacist

## 2021-01-28 ENCOUNTER — Inpatient Hospital Stay (HOSPITAL_BASED_OUTPATIENT_CLINIC_OR_DEPARTMENT_OTHER): Payer: Medicare HMO | Admitting: Hematology

## 2021-01-28 ENCOUNTER — Inpatient Hospital Stay: Payer: Medicare HMO | Admitting: Hematology

## 2021-01-28 ENCOUNTER — Telehealth: Payer: Self-pay | Admitting: Hematology

## 2021-01-28 ENCOUNTER — Other Ambulatory Visit (HOSPITAL_COMMUNITY): Payer: Self-pay | Admitting: Hematology

## 2021-01-28 ENCOUNTER — Telehealth: Payer: Self-pay

## 2021-01-28 DIAGNOSIS — C181 Malignant neoplasm of appendix: Secondary | ICD-10-CM | POA: Diagnosis not present

## 2021-01-28 MED ORDER — CAPECITABINE 500 MG PO TABS: 1000.0000 mg/m2 | ORAL_TABLET | Freq: Two times a day (BID) | ORAL | 0 refills | Status: DC

## 2021-01-28 NOTE — Telephone Encounter (Signed)
Oral Oncology Patient Advocate Encounter  Prior Authorization for Xeloda has been approved.    PA# [id number] Effective dates: 01/28/21 through 01/28/22  Patients co-pay is $367.79  Oral Oncology Clinic will continue to follow.    [place] [facility] [place] Phone [phone] Fax [phone] 01/28/2021 2:08 PM

## 2021-01-28 NOTE — Telephone Encounter (Signed)
Rescheduled appointment to a later time per provider's request. Patient is aware of changes.

## 2021-01-28 NOTE — Progress Notes (Signed)
[place]   [phone]) [phone] Fax:(336) [phone]   Clinic Follow up Note   Patient Care Team: [name], DO as PCP - General (Family Medicine) Jonnie Finner, RN as Oncology Nurse Navigator [name], MD as Consulting Physician (Hematology) Icard, Octavio Graves, DO as Consulting Physician (Pulmonary Disease) [place] [name], MD as Consulting Physician (General Surgery) [name], MD as Consulting Physician (General Surgery) [name], [name], MD as Consulting Physician (Gastroenterology)  Date of Service:  01/28/2021    I connected with Tracy [name] on 01/28/2021 at  9:15 AM EST by telephone visit and verified that I am speaking with the correct person using two identifiers.  I discussed the limitations, risks, security and privacy concerns of performing an evaluation and management service by telephone and the availability of in person appointments. I also discussed with the patient that there may be a patient responsible charge related to this service. The patient expressed understanding and agreed to proceed.   Other persons participating in the visit and their role in the encounter:  Her husband   Patient's location: His home  Provider's location: My Office   CHIEF COMPLAINT: Discuss PET scan findings   SUMMARY OF ONCOLOGIC HISTORY: Oncology History Overview Note  Cancer Staging Rectal cancer [facility]) Staging form: Colon and Rectum, AJCC 8th Edition - Pathologic stage from 03/03/2020: Stage IIIB (pT4a, pN1b, cM0) - Signed by [name], MD on 01/28/2021 Stage prefix: Initial diagnosis Histologic grading system: 4 grade system Histologic grade (G): G2 Residual tumor (R): R0 - None    Rectal cancer ([place])  03/03/2020 - 04/05/2020 Hospital Admission   She was admitted to ED on 03/03/20 for abdominal pain and nausea. She had been having diarrhea for several weeks. During hospital stay she developed acute  respiratory failure, AKI, RUE DVT from PICC line. Work up showed bowel perforation, small left liver mass and thickening of jejunum. She underwent emergent surgery on 03/03/20 for resection and colostomy placement. Her path showed invasive cancer, metastatic to 3/15 LNs. She had a NGT in placed but this was removed on POD 3. Post op her stoma became necrotic and septic. She had another bowel surgery on 03/12/20, which was NED with necrotic tissue.    03/03/2020 Imaging   CT AP 03/03/20  IMPRESSION: 1. Free intraperitoneal air consistent perforation. Most likely site of perforation is in the distal splenic flexure/proximal descending colon where there is a collection of air and gas measuring 6 centimeters. No obvious soft tissue mass identified in this region. At this site, there is abrupt transition of dilated, stool-filled colon to completely decompressed proximal descending colon. 2. Thickened, inflamed loops of jejunum are identified within the pelvis and are likely reactive. 3. Small hiatal hernia. 4. Benign-appearing 1.6 centimeter mass the LEFT hepatic lobe. Recommend comparison with prior studies if available. 5.  Emphysema (ICD10-J43.9). 6.  Aortic Atherosclerosis (ICD10-I70.0). 7. Bilateral renal scarring.   03/03/2020 Surgery   low anterior resection end colostomy by Dr Marcello Moores    03/03/2020 Initial Biopsy   FINAL MICROSCOPIC DIAGNOSIS: 03/03/20 A. RECTOSIGMOID COLON, LOW ANTERIOR RESECTION:  - Invasive colonic adenocarcinoma, 3.5 cm.  - Tumor invades the visceral peritoneum.  - Margins of resection are not involved.  - Metastatic carcinoma in (3) of (15) lymph nodes.  - See oncology table.   B. ADDITIONAL SIGMOID COLON, RESECTION:  - Colonic tissue, negative for carcinoma.  ADDENDUM:  Mismatch Repair Protein (IHC)   SUMMARY INTERPRETATION: NORMAL  There  is preserved expression of the major MMR proteins. There is a very  low probability that microsatellite instability (MSI)  is present.  However, certain clinically significant MMR protein mutations may result  in preservation of nuclear expression. It is recommended that the  preservation of protein expression be correlated with molecular based  MSI testing.   IHC EXPRESSION RESULTS  TEST           RESULT  MLH1:          Preserved nuclear expression  MSH2:          Preserved nuclear expression  MSH6:          Preserved nuclear expression  PMS2:          Preserved nuclear expression   03/03/2020 Cancer Staging   Staging form: Colon and Rectum, AJCC 8th Edition - Pathologic stage from 03/03/2020: Stage IIIB (pT4a, pN1b, cM0) - Signed by [name], MD on 01/28/2021 Stage prefix: Initial diagnosis Histologic grading system: 4 grade system Histologic grade (G): G2 Residual tumor (R): R0 - None   03/08/2020 Imaging   CT AP 03/08/20 IMPRESSION: 1. Interval midline laparotomy with distal colon resection and diverting left lower quadrant colostomy. 2. Diffuse small bowel dilatation with gas fluid levels most consistent with postoperative ileus. 3. Trace ascites within the abdomen and pelvis. No fluid collection or abscess at this time. Surgical drain within the lower pelvis. 4. Indeterminate 1.4 cm subcapsular liver hypodensity. In light of newly diagnosed rectal cancer, metastatic disease cannot be excluded. PET CT may be useful for further evaluation. 5. Interval development of trace bilateral pleural effusions and diffuse body wall edema.   03/12/2020 Surgery   EXPLORATORY LAPAROTOMY WITH BOWEL RESECTION AND COLOSTOMY by Dr Hassell Done 03/12/20  FINAL MICROSCOPIC DIAGNOSIS: 03/12/20  A. COLON, SPLENIC FLEXURE, RESECTION:  - Segment of colon (37 cm) with perforation and associated inflammation  - Multiple mucosal ulcers with necrotizing inflammation  - No evidence of malignancy    03/12/2020 Imaging   CT AP WO contrast 03/12/20  IMPRESSION: 1. Exam is limited by lack of intravenous contrast, motion,  and artifact from the patient's arms adjacent to the torso. Since 03/08/2020, there has been development of relatively large pockets of intraperitoneal free air. While intraperitoneal gas would not be unexpected on postoperative day 9, this gas is new since an intervening study of 03/08/2020 and given the relatively large volume certainly raises concern for bowel perforation. No source for the intraperitoneal free air is evident on this study. There is a surgical drain in the pelvis, but there is no free gas around the drain itself to suggest that it represents the source. 2. New circumferential wall thickening in the splenic flexure, descending colon and sigmoid colon leading into the end colostomy. Infection/inflammation would be a consideration. Ischemia cannot be excluded. 3. Relatively small volume intraperitoneal free fluid. High attenuation small fluid collections in the left upper abdomen may reflect hemorrhage, infection or residua from prior perforation. 4. Interval progression of diffuse body wall edema. 5. Residual contrast material in the renal parenchyma from prior imaging, compatible with renal dysfunction.    03/19/2020 Imaging   CT CAP 03/19/20  IMPRESSION: 1. 5.5 x 3.2 cm fluid collection is noted along the greater curvature of the proximal stomach. 2. Surgical drain is again noted in the pelvis with tip in left lower quadrant. 3. Interval development of crescent-shaped fluid collection measuring 16.4 x 3.3 cm in the epigastric region and left upper quadrant of the abdomen  which may extend into the left lower quadrant. Potentially this may represent abscess or developing abscess. Multiple other smaller fluid collections are noted which may represent small abscesses. 4. 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 5. Mild amount of free fluid is noted in the posterior pelvis. 6. Moderate anasarca is noted. Aortic Atherosclerosis (ICD10-I70.0).   04/12/2020  Imaging   CT AP 04/12/20  IMPRESSION: 1. Fluid collections within the LEFT abdomen have significantly decreased compared to previous CT exams, now nearly completely resolved. 2. Percutaneous drainage catheter with tip coiled posterior to the LEFT kidney, stable positioning compared to the previous study. The more anterior catheter has been removed. 3. Small amount of free fluid persists within the abdomen and pelvis. 4. Trace bibasilar pleural effusions. 5. Anasarca. 6. While reviewing today's study, comparing with a chest/abdomen/pelvis CT from earlier same month, there is question of thrombus in the LEFT internal jugular vein. Recommend ultrasound of the LEFT IJ to exclude DVT. This recommendation discussed with patient's hospitalist, Dr. Owens Shark, on 04/12/2020 at 4:20 p.m.   Emphysema (ICD10-J43.9).   04/22/2020 Imaging   CT AP 04/22/20  IMPRESSION: 1. No recurrent intra-abdominal abscess status post interval removal of left retroperitoneal percutaneous drain. Stable small amount of free pelvic fluid. 2. Enlarging dependent bilateral pleural effusions, now moderate in volume. Associated atelectasis at both lung bases. 3. Progressive anasarca with generalized edema throughout the subcutaneous fat. 4. Stable small subcapsular fluid collection along the anterior aspect of the left hepatic lobe. 5. Aortic Atherosclerosis (ICD10-I70.0).   05/23/2020 Initial Diagnosis   Rectal cancer ([place])   01/14/2021 Imaging   CT CAP IMPRESSION: -5.0 cm soft tissue mass along the posterior aspect of the cecum at the base of the appendix, favored to reflect a peritoneal/serosal implant, corresponding to the patient's known adenocarcinoma. This is new from the prior. -Suspected additional pelvic implants along the uterus and right adnexa, poorly visualized, new/progressive from the prior. -Additional pericapsular lesion along the lateral spleen is mildly progressive, suspicious for additional  peritoneal implant. -No evidence of metastatic disease in the chest.   01/27/2021 PET scan   IMPRESSION:  1. Extensive hypermetabolic peritoneal metastasis, as detailed  above.  2. No evidence of hypermetabolic supradiaphragmatic disease.  3. Diffuse low-level thyroid hypermetabolism can be seen in the  setting of thyroiditis. Consider correlation with thyroid function  labs.  4. Aortic atherosclerosis (ICD10-I70.0) and emphysema (ICD10-J43.9).    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 - Polypoid lesion obliterating appendiceal orifice. Biopsied to evaluate for malignancy. - The examined portion of the ileum was normal. - Two large polyps in the cecum, not removed today pending path at appendiceal orifice, bowel prep. If removed endoscopically in the future would favor EMR to be done at the hospital. - One 5 mm polyp at the ileocecal valve, removed with a cold snare. Resected and retrieved. - One 3 mm polyp in the transverse colon, removed with a cold snare. Resected and retrieved. - Parastomal hernia making initial entry to the distal colon challenging. - The examination was otherwise normal.   01/06/2021 Initial Biopsy   Diagnosis 1. Colon, biopsy, appendiceal oraface - ADENOCARCINOMA. 2. Colon, polyp(s), ileocecal valve, transverse, x2 - TUBULAR ADENOMA, NEGATIVE FOR HIGH GRADE DYSPLASIA (X1). - COLONIC MUCOSA WITH UNDERLYING LYMPHOID AGGREGATE, NEGATIVE FOR DYSPLASIA (X1).  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 This is new from the  prior. -Suspected additional pelvic implants along the uterus and right adnexa, poorly visualized, new/progressive from the prior. -Additional pericapsular lesion along the lateral spleen is mildly progressive, suspicious for additional peritoneal implant. -No evidence of metastatic disease in the chest.      CURRENT THERAPY:  Pending first line chemo CAPOX   INTERVAL HISTORY:  Tracy [name] is scheduled for a phone visit today to discuss her PET scan findings.  She verified her identity by date of birth and address, her husband participated the phone with her also. She is doing well overall, denies any pain or abdominal discomfort, no change of her energy and activity level, weight is stable.   All other systems were reviewed with the patient and are negative.  MEDICAL HISTORY:  Past Medical History:  Diagnosis Date  . Acute deep vein thrombosis (DVT) of brachial vein of right upper extremity ([place])   . Acute on chronic respiratory failure with hypoxia ([place])   . Asthma   . Cataract   . CHICKENPOX, HX OF 01/06/2011   Qualifier: Diagnosis of  By: Charlett Blake MD, Erline Levine    . COPD (chronic obstructive pulmonary disease) ([place]) 2011   FeV1 31% predicted FeV1/FVX 47 %  . Essential hypertension 05/23/2020  . Hemorrhoid   . Open right radial fracture 2020  . Osteopenia   . Perforated sigmoid colon ([place]) 03/03/2020  . Pleural effusion   . Pneumonia 2021  . Postoperative intra-abdominal abscess 2021  . Rectal cancer ([place])   . Seasonal allergies    takes Claritin daily prn  . Shock circulatory ([place])   . Tracheostomy status ([place])   . Vitamin D deficiency    takes Vit d every 14 days    SURGICAL HISTORY: Past Surgical History:  Procedure Laterality Date  . AUGMENTATION MAMMAPLASTY     saline  . EXAMINATION UNDER ANESTHESIA  10/14/2012   Procedure: EXAM UNDER ANESTHESIA;  Surgeon: Gayland Curry, MD,FACS;  Location: [place];  Service: General;  Laterality: N/A;  rectal exam under  anesthesia excisional hemorrhoidectomy hemorrhoidal banding x two  . EXAMINATION UNDER ANESTHESIA  02/03/2013   excision hemorrhoidal tissue  . FOOT SURGERY     left bunionectomy  . HEMORRHOIDECTOMY WITH HEMORRHOID BANDING  10/14/2012   Procedure: HEMORRHOIDECTOMY WITH HEMORRHOID BANDING;  Surgeon: Gayland Curry, MD,FACS;  Location: [place];  Service: General;  Laterality: N/A;  . IR THORACENTESIS ASP PLEURAL SPACE W/IMG GUIDE  04/23/2020  . LAPAROTOMY N/A 03/03/2020   Procedure: low anterior resection end colostomy;  Surgeon: [name], MD;  Location: [facility];  Service: General;  Laterality: N/A;  . LAPAROTOMY N/A 03/12/2020   Procedure: EXPLORATORY LAPAROTOMY WITH BOWEL RESECTION AND COLOSTOMY;  Surgeon: [name], MD;  Location: [facility];  Service: General;  Laterality: N/A;  . OPEN REDUCTION INTERNAL FIXATION (ORIF) DISTAL RADIAL FRACTURE Right 11/01/2018   Procedure: OPEN REDUCTION INTERNAL FIXATION (ORIF) DISTAL RADIAL FRACTURE;  Surgeon: [name], MD;  Location: [place];  Service: Orthopedics;  Laterality: Right;  . TONSILLECTOMY  1960   recurrent otitis media  . US ECHOCARDIOGRAPHY  02/2020    poor windows, normal LV function, severely dilated RV with moderately reduced function, RV volume  and pressure overload, mildly dilated RA    I have reviewed the social history and family history with the patient and they are unchanged from previous note.  ALLERGIES:  is allergic to amlodipine and codeine.  MEDICATIONS:  Current Outpatient Medications  Medication Sig Dispense Refill  . capecitabine (XELODA) 500 MG tablet Take 3 tablets (1,500 mg total) by mouth 2 (two) times daily after a meal. 84 tablet 0  . albuterol (PROVENTIL) (2.5 MG/3ML) 0.083% nebulizer solution Take 3 mLs (2.5 mg total) by nebulization every 4 (four) hours as needed for wheezing or shortness of breath. DX: J44.9 (Patient not taking: Reported on 01/06/2021) 360 mL 5  . albuterol (VENTOLIN HFA)  108 (90 Base) MCG/ACT inhaler Inhale 2 puffs into the lungs every 6 (six) hours as needed for wheezing or shortness of breath. 8 g 5  . ALPRAZolam (XANAX) 0.25 MG tablet Take 1 tablet (0.25 mg total) by mouth at bedtime as needed for anxiety. 30 tablet 0  . diphenhydrAMINE (BENADRYL) 25 mg capsule Take 25 mg by mouth every 6 (six) hours as needed.    . docusate sodium (COLACE) 50 MG capsule Take 50 mg by mouth daily as needed for mild constipation. (Patient not taking: Reported on 01/06/2021)    . fluticasone (FLONASE) 50 MCG/ACT nasal spray Place 2 sprays into both nostrils daily. (Patient not taking: Reported on 01/06/2021) 16 mL 5  . Fluticasone-Umeclidin-Vilant (TRELEGY ELLIPTA) 100-62.5-25 MCG/INH AEPB Inhale 1 puff into the lungs daily. 60 each 5   No current facility-administered medications for this visit.    PHYSICAL EXAMINATION: ECOG PERFORMANCE STATUS: 1 - Symptomatic but completely ambulatory  No exam today   LABORATORY DATA:  I have reviewed the data as listed CBC Latest Ref Rng & Units 01/15/2021 11/25/2020 08/27/2020  WBC 4.0 - 10.5 K/uL 7.6 7.8 7.0  Hemoglobin 12.0 - 15.0 g/dL 13.7 13.5 14.6  Hematocrit 36.0 - 46.0 % 43.2 41.6 45.8  Platelets 150 - 400 K/uL 276 372 328     CMP Latest Ref Rng & Units 01/15/2021 01/14/2021 11/25/2020  Glucose 70 - 99 mg/dL 110(H) - 86  BUN 8 - 23 mg/dL 18 - 12  Creatinine 0.44 - 1.00 mg/dL 0.89 0.90 1.00  Sodium 135 - 145 mmol/L 140 - 139  Potassium 3.5 - 5.1 mmol/L 4.3 - 4.4  Chloride 98 - 111 mmol/L 104 - 105  CO2 22 - 32 mmol/L 26 - 27  Calcium 8.9 - 10.3 mg/dL 9.4 - 9.6  Total Protein 6.5 - 8.1 g/dL 7.1 - 7.2  Total Bilirubin 0.3 - 1.2 mg/dL 0.7 - 0.4  Alkaline Phos 38 - 126 U/L 83 - 105  AST 15 - 41 U/L 26 - 25  ALT 0 - 44 U/L 20 - 17      RADIOGRAPHIC STUDIES: I have personally reviewed the radiological images as listed and agreed with the findings in the report. NM PET Image Initial (PI) Skull Base To Thigh  Result Date:  01/28/2021 CLINICAL DATA:  Initial treatment strategy for malignant neoplasm of appendix. Colon surgery 3/21. Colostomy. Prior low anterior resection for rectosigmoid colon cancer. EXAM: NUCLEAR MEDICINE PET SKULL BASE TO THIGH TECHNIQUE: 6.8 mCi F-18 FDG was injected intravenously. Full-ring PET imaging was performed from the skull base to thigh after the radiotracer. CT data was obtained and used for attenuation correction and anatomic localization. Fasting blood glucose: 105 mg/dl COMPARISON:  Chest abdomen and pelvic CTs of 01/14/2021. FINDINGS: Mediastinal blood pool activity: SUV max 2.4  Liver activity: SUV max NA NECK: Diffuse mild thyroid hypermetabolism at a S.U.V. max of 4.6. No cervical nodal hypermetabolism. Incidental CT findings: No cervical adenopathy. CHEST: No pulmonary parenchymal or thoracic nodal hypermetabolism. Incidental CT findings: Aortic atherosclerosis. Bilateral breast implants. Centrilobular emphysema. ABDOMEN/PELVIS: Multifocal serosal/peritoneal hypermetabolism. The most well-defined is along the capsule of the spleen 3.6 cm and a S.U.V. max of 10.8 on 95/4. Focal hypermetabolism along the small bowel the left side of the abdomen measures a S.U.V. max of 6.6 on 129/4. No well-defined CT correlate. Hypermetabolism corresponding to soft tissue mass posterior to the cecum. This measures a S.U.V. max of 13.4, including on 149/4. Difficult to differentiate adjacent uterus on this noncontrast CT. There is also hypermetabolism corresponding to the posterior right pelvic implant on prior CT. Example at a S.U.V. max of 10.4 on 153/4. Left greater than right pelvic foci of hypermetabolism which are suspicious for peritoneal implants but could also represent physiologic ureteric hypermetabolism, given location. Examples adjacent the dilated appendix including at a S.U.V. max of 11.3. Incidental CT findings: Deferred to recent diagnostic CT. Abdominal aortic atherosclerosis. Anterior abdominal  wall laxity in the setting of colostomy. SKELETON: No abnormal marrow activity. Incidental CT findings: Osteopenia. IMPRESSION: 1. Extensive hypermetabolic peritoneal metastasis, as detailed above. 2. No evidence of hypermetabolic supradiaphragmatic disease. 3. Diffuse low-level thyroid hypermetabolism can be seen in the setting of thyroiditis. Consider correlation with thyroid function labs. 4. Aortic atherosclerosis (ICD10-I70.0) and emphysema (ICD10-J43.9). Electronically Signed   By: Abigail Miyamoto M.D.   On: 01/28/2021 09:11     ASSESSMENT & PLAN:  Tracy [name] is a 70 y.o. female with    1. Rectosigmoid cancer(perforated),Stage III,pT4aN1b,with peroration,MSI Stable, peritoneal metastasis in 01/2021  -She was diagnosed in 02/2020.Workup showed she had bowel perforation and underwent emergent surgery. Pathology showed invasiverectosigmoidadenocarcinoma and perforatedbowel with3/15positiveLN.Margins were clear. She haspermanentcolostomy bag in place. Her initial CT scan was negative for distant mets. -She has stage III colon cancer and with bowel perforation she hasveryhigh risk of cancer recurrence, unfortunately she was not a candidate for adjuvant chemo after surgery due to very slow recovery.  -She did opt to proceed with Guardant Reveal study in 06/06/20, 07/12/20 and 09/10/20 which were all negative.  -Unfortunately her screening colonoscopy on January 06, 2021 which showed a mass in the appendiceal orifice and biopsy confirmed adenocarcinoma.  -I personally reviewed her PET scan images and discussed results with patient and her husband.  It showed multiple hypermetabolic peritoneal metastasis, no other liver or lung metastasis. I think the tumor in appendiceal orifice is likely peritoneal met growing into cecum.  -Her peritoneal metastasis is mainly in the pelvis, difficult to biopsy.  Her PET scan is pretty convincing for metastatic disease, I do not strongly feel she needs  biopsy to confirm. She agrees.  -We discussed the overall prognosis of her metastatic colon cancer, which is unfortunately curable at this stage. She is not a candidate for HIPEC surgery due to her medical comorbidities and previously for recovery from colon surgery.  She voiced good understanding. -I discussed the first-line chemotherapy options, including FOLFOX, [place] ox and FOLFIRI. Due to her medical comorbidities, I will start her on capecitabine for first two cycles, if she tolerates well, will add oxaliplatin for cycle 3 (will do CAPOX or FOLFOX) --Chemotherapy consent: Side effects including but does not not limited to, fatigue, nausea, vomiting, diarrhea, hair loss, neuropathy, fluid retention, renal and kidney dysfunction, neutropenic fever, needed for blood transfusion, bleeding, were discussed  with patient in great detail. She agrees to proceed. -The goal of chemotherapy is palliative, to prolong her life, and prevent cancer related symptoms -I have requested Foundation One on her previous biopsy sample, to see if she is a candidate for EGFR inhibitor, if not, will add on bevacizumab in future  -plan to start first cycle of Xeloda 1500 mg twice daily, on day 1-14 every 21 days, as soon as she receives the medicine -f/u the second week of xeloda    2.ProvokedRUE DVT on 04/04/20. S/p 3 months of Eliquis  3. Comorbidities: Asthma, COPD, Anxiety  -On nebulizer, Cymbalta and Xanax 0.78m 1-2 daily, Benadryl and Flonase  -Continue to f/u with pulmonologist and she can request rescue inhaler from them.   4. Goal of care discussion  -We again discussed the incurable nature of her cancer, and the overall poor prognosis, especially if she does not have good response to chemotherapy or progress on chemo -The patient understands the goal of care is palliative. -she is full code for now    PLAN: -PET scan findings discussed -Lab and chemo class this week -I called in Xeloda 1500 mg  twice daily for day 1-14, every 21 days, to [employer], plan to start asap -Lab and f/u in 2 weeks  -port placement in 2 weeks    No problem-specific Assessment & Plan notes found for this encounter.   Orders Placed This Encounter  Procedures  . IR IMAGING GUIDED PORT INSERTION    Standing Status:   Future    Standing Expiration Date:   01/28/2022    Order Specific Question:   Reason for Exam (SYMPTOM  OR DIAGNOSIS REQUIRED)    Answer:   chemotherapy    Order Specific Question:   [facility]    [facility]  All questions were answered. The patient knows to call the clinic with any problems, questions or concerns. No barriers to learning was detected. The total time spent in the appointment was 40 minutes.     YTruitt Merle MD 01/28/2021   I, AJoslyn Devon am acting as scribe for YTruitt Merle MD.   I have reviewed the above documentation for accuracy and completeness, and I agree with the above.

## 2021-01-28 NOTE — Telephone Encounter (Signed)
Oral Oncology Pharmacist Encounter  Received new prescription for Xeloda (capecitabine) for the treatment of metastatic colon cancer, per MD plan to start as monotherapy for first 2 cycles and then assess for addition of oxaliplatin with 3rd cycle, planned duration until disease progression or unacceptable drug toxicity.  Prescription dose and frequency assessed for appropriateness. Appropriate for therapy initiation.   CBC w/ Diff and CMP from 01/15/21 assessed, labs stable for treatment initiation.  Current medication list in Epic reviewed, no relevant/significant DDIs with Xeloda identified.  Evaluated chart and no patient barriers to medication adherence noted.   Prescription has been e-scribed to the [facility] for benefits analysis and approval.  Oral Oncology Clinic will continue to follow for insurance authorization, copayment issues, initial counseling and start date.  Leron Croak, PharmD, BCPS Hematology/Oncology Clinical Pharmacist [facility] [phone] 01/28/2021 12:21 PM

## 2021-01-28 NOTE — Telephone Encounter (Signed)
Scheduled follow-up appointments per 2/15 los. Patient is aware. °

## 2021-01-28 NOTE — Telephone Encounter (Signed)
Oral Oncology Patient Advocate Encounter  Received notification from [place] that prior authorization for Xeloda is required.  PA submitted on CoverMyMeds Key [id number] Status is pending  Oral Oncology Clinic will continue to follow.   [place] Patient [place] Phone [phone] Fax [phone] 01/28/2021 10:28 AM

## 2021-01-29 ENCOUNTER — Telehealth: Payer: Medicare HMO | Admitting: Hematology

## 2021-01-29 ENCOUNTER — Inpatient Hospital Stay: Payer: Medicare HMO

## 2021-01-29 ENCOUNTER — Other Ambulatory Visit: Payer: Self-pay

## 2021-01-29 DIAGNOSIS — Z86718 Personal history of other venous thrombosis and embolism: Secondary | ICD-10-CM | POA: Diagnosis not present

## 2021-01-29 DIAGNOSIS — Z79899 Other long term (current) drug therapy: Secondary | ICD-10-CM | POA: Diagnosis not present

## 2021-01-29 DIAGNOSIS — Z930 Tracheostomy status: Secondary | ICD-10-CM | POA: Diagnosis not present

## 2021-01-29 DIAGNOSIS — Z933 Colostomy status: Secondary | ICD-10-CM | POA: Diagnosis not present

## 2021-01-29 DIAGNOSIS — C19 Malignant neoplasm of rectosigmoid junction: Secondary | ICD-10-CM | POA: Diagnosis not present

## 2021-01-29 DIAGNOSIS — I10 Essential (primary) hypertension: Secondary | ICD-10-CM | POA: Diagnosis not present

## 2021-01-29 DIAGNOSIS — C181 Malignant neoplasm of appendix: Secondary | ICD-10-CM | POA: Diagnosis not present

## 2021-01-29 DIAGNOSIS — R69 Illness, unspecified: Secondary | ICD-10-CM | POA: Diagnosis not present

## 2021-01-29 DIAGNOSIS — J439 Emphysema, unspecified: Secondary | ICD-10-CM | POA: Diagnosis not present

## 2021-01-29 DIAGNOSIS — M8580 Other specified disorders of bone density and structure, unspecified site: Secondary | ICD-10-CM | POA: Diagnosis not present

## 2021-01-29 DIAGNOSIS — C20 Malignant neoplasm of rectum: Secondary | ICD-10-CM

## 2021-01-29 LAB — CMP (CANCER CENTER ONLY)
ALT: 13 U/L (ref 0–44)
AST: 17 U/L (ref 15–41)
Albumin: 4.2 g/dL (ref 3.5–5.0)
Alkaline Phosphatase: 107 U/L (ref 38–126)
Anion gap: 10 (ref 5–15)
BUN: 17 mg/dL (ref 8–23)
CO2: 26 mmol/L (ref 22–32)
Calcium: 9.6 mg/dL (ref 8.9–10.3)
Chloride: 106 mmol/L (ref 98–111)
Creatinine: 1.14 mg/dL — ABNORMAL HIGH (ref 0.44–1.00)
GFR, Estimated: 53 mL/min — ABNORMAL LOW (ref >60)
Glucose, Bld: 89 mg/dL (ref 70–99)
Potassium: 4.7 mmol/L (ref 3.5–5.1)
Sodium: 142 mmol/L (ref 135–145)
Total Bilirubin: 0.4 mg/dL (ref 0.3–1.2)
Total Protein: 7.7 g/dL (ref 6.5–8.1)

## 2021-01-29 LAB — CBC WITH DIFFERENTIAL (CANCER CENTER ONLY)
Abs Immature Granulocytes: 0.03 10*3/uL (ref 0.00–0.07)
Basophils Absolute: 0.1 10*3/uL (ref 0.0–0.1)
Basophils Relative: 1 %
Eosinophils Absolute: 0.1 10*3/uL (ref 0.0–0.5)
Eosinophils Relative: 1 %
HCT: 45.8 % (ref 36.0–46.0)
Hemoglobin: 14.6 g/dL (ref 12.0–15.0)
Immature Granulocytes: 0 %
Lymphocytes Relative: 21 %
Lymphs Abs: 2.3 10*3/uL (ref 0.7–4.0)
MCH: 31.5 pg (ref 26.0–34.0)
MCHC: 31.9 g/dL (ref 30.0–36.0)
MCV: 98.9 fL (ref 80.0–100.0)
Monocytes Absolute: 1.1 10*3/uL — ABNORMAL HIGH (ref 0.1–1.0)
Monocytes Relative: 10 %
Neutro Abs: 7.4 10*3/uL (ref 1.7–7.7)
Neutrophils Relative %: 67 %
Platelet Count: 354 10*3/uL (ref 150–400)
RBC: 4.63 MIL/uL (ref 3.87–5.11)
RDW: 12.6 % (ref 11.5–15.5)
WBC Count: 10.9 10*3/uL — ABNORMAL HIGH (ref 4.0–10.5)
nRBC: 0 % (ref 0.0–0.2)

## 2021-01-29 LAB — CEA (IN HOUSE-CHCC): CEA (CHCC-In House): 5.26 ng/mL — ABNORMAL HIGH (ref 0.00–5.00)

## 2021-01-29 LAB — IRON AND TIBC
Iron: 91 ug/dL (ref 41–142)
Saturation Ratios: 24 % (ref 21–57)
TIBC: 370 ug/dL (ref 236–444)
UIBC: 280 ug/dL (ref 120–384)

## 2021-01-29 LAB — FERRITIN: Ferritin: 37 ng/mL (ref 11–307)

## 2021-01-29 MED FILL — CAPECITABINE 500 MG TABLET: 500 | 21 days supply | Qty: 84 | Fill #0

## 2021-01-29 NOTE — Telephone Encounter (Signed)
Oral Chemotherapy Pharmacist Encounter  I spoke with patient for overview of: Xeloda (capecitabine) for the treatment of metastatic colon cancer, per MD plan to start as monotherapy for first 2 cycles and then assess for addition of oxaliplatin with 3rd cycle, planned duration until disease progression or unacceptable drug toxicity.  Counseled patient on administration, dosing, side effects, monitoring, drug-food interactions, safe handling, storage, and disposal.  Patient will take Xeloda 500mg  tablets, 3 tablets (1500mg ) by mouth in AM and 3 tabs (1500mg ) by mouth in PM, within 30 minutes of finishing meals, for 14 days on, 7 days off, repeated every 21 days.  Xeloda start date: 01/31/21  Adverse effects include but are not limited to: fatigue, decreased blood counts, GI upset, diarrhea, mouth sores, and hand-foot syndrome.  Patient will obtain anti diarrheal and alert the office of 4 or more loose stools above baseline.  Reviewed with patient importance of keeping a medication schedule and plan for any missed doses. No barriers to medication adherence identified.  Medication reconciliation performed and medication/allergy list updated.  This will ship from the [facility] on 01/29/21 to deliver to patient's home on 01/30/21.  Patient informed the pharmacy will reach out 5-7 days prior to needing next fill of Xeloda to coordinate continued medication acquisition to prevent break in therapy.  All questions answered.  Tracy [name] voiced understanding and appreciation.   Medication education handout placed in mail for patient. Patient knows to call the office with questions or concerns. Oral Chemotherapy Clinic phone number provided to patient.   Leron Croak, PharmD, BCPS Hematology/Oncology Clinical Pharmacist [facility] [phone] 01/29/2021 10:45 AM

## 2021-01-30 ENCOUNTER — Telehealth: Payer: Self-pay

## 2021-01-30 NOTE — Telephone Encounter (Signed)
Oral Oncology Patient Advocate Encounter  Met patient in lobby room to complete application for [place] in an effort to reduce patient's out of pocket expense for Xeloda to $0.    Application completed and faxed to [phone].   [employer] patient assistance phone number for follow up is [phone].   This encounter will be updated until final determination.   [place] Patient [place] Phone [phone] Fax [phone] 01/30/2021 10:16 AM

## 2021-01-30 NOTE — Telephone Encounter (Signed)
Spoke with pt reviewed most recent lab results encouraged pt to increase fluids f/u as needed and scheduled

## 2021-01-30 NOTE — Telephone Encounter (Signed)
-----   Message from [name], MD sent at 01/30/2021  8:00 AM EST ----- Let pt know her lab are good, but Cr slightly higher than before, make sure adequate hydration, thanks   [name]  01/30/2021

## 2021-01-31 ENCOUNTER — Other Ambulatory Visit: Payer: Self-pay | Admitting: Radiology

## 2021-02-03 ENCOUNTER — Other Ambulatory Visit: Payer: Self-pay

## 2021-02-03 ENCOUNTER — Ambulatory Visit (HOSPITAL_COMMUNITY)
Admission: RE | Admit: 2021-02-03 | Discharge: 2021-02-03 | Disposition: A | Discharge status: Home / Self Care | Payer: Medicare HMO | Source: Ambulatory Visit | Attending: Hematology | Admitting: Hematology

## 2021-02-03 ENCOUNTER — Encounter (HOSPITAL_COMMUNITY): Payer: Self-pay

## 2021-02-03 DIAGNOSIS — Z452 Encounter for adjustment and management of vascular access device: Secondary | ICD-10-CM | POA: Diagnosis not present

## 2021-02-03 DIAGNOSIS — J439 Emphysema, unspecified: Secondary | ICD-10-CM | POA: Diagnosis not present

## 2021-02-03 DIAGNOSIS — C181 Malignant neoplasm of appendix: Secondary | ICD-10-CM

## 2021-02-03 DIAGNOSIS — I700 Atherosclerosis of aorta: Secondary | ICD-10-CM | POA: Insufficient documentation

## 2021-02-03 DIAGNOSIS — Z888 Allergy status to other drugs, medicaments and biological substances status: Secondary | ICD-10-CM | POA: Diagnosis not present

## 2021-02-03 DIAGNOSIS — Z885 Allergy status to narcotic agent status: Secondary | ICD-10-CM | POA: Diagnosis not present

## 2021-02-03 HISTORY — PX: IR IMAGING GUIDED PORT INSERTION: IMG5740

## 2021-02-03 LAB — CBC WITH DIFFERENTIAL/PLATELET
Abs Immature Granulocytes: 0.03 10*3/uL (ref 0.00–0.07)
Basophils Absolute: 0 10*3/uL (ref 0.0–0.1)
Basophils Relative: 0 %
Eosinophils Absolute: 0 10*3/uL (ref 0.0–0.5)
Eosinophils Relative: 0 %
HCT: 44.8 % (ref 36.0–46.0)
Hemoglobin: 14.7 g/dL (ref 12.0–15.0)
Immature Granulocytes: 0 %
Lymphocytes Relative: 15 %
Lymphs Abs: 1.3 10*3/uL (ref 0.7–4.0)
MCH: 32.6 pg (ref 26.0–34.0)
MCHC: 32.8 g/dL (ref 30.0–36.0)
MCV: 99.3 fL (ref 80.0–100.0)
Monocytes Absolute: 0.7 10*3/uL (ref 0.1–1.0)
Monocytes Relative: 8 %
Neutro Abs: 6.8 10*3/uL (ref 1.7–7.7)
Neutrophils Relative %: 77 %
Platelets: 392 10*3/uL (ref 150–400)
RBC: 4.51 MIL/uL (ref 3.87–5.11)
RDW: 12.6 % (ref 11.5–15.5)
WBC: 8.8 10*3/uL (ref 4.0–10.5)
nRBC: 0 % (ref 0.0–0.2)

## 2021-02-03 LAB — PROTIME-INR
INR: 0.8 (ref 0.8–1.2)
Prothrombin Time: 11.2 seconds — ABNORMAL LOW (ref 11.4–15.2)

## 2021-02-03 MED ORDER — FENTANYL CITRATE (PF) 100 MCG/2ML IJ SOLN
INTRAMUSCULAR | Status: AC | PRN
  Administered 2021-02-03 (×2): 50 ug via INTRAVENOUS

## 2021-02-03 MED ORDER — HEPARIN SOD (PORK) LOCK FLUSH 100 UNIT/ML IV SOLN
INTRAVENOUS | Status: AC
  Filled 2021-02-03: qty 5

## 2021-02-03 MED ORDER — SODIUM CHLORIDE 0.9 % IV SOLN: INTRAVENOUS | Status: DC

## 2021-02-03 MED ORDER — MIDAZOLAM HCL 2 MG/2ML IJ SOLN
INTRAMUSCULAR | Status: AC | PRN
  Administered 2021-02-03 (×2): 1 mg via INTRAVENOUS

## 2021-02-03 MED ORDER — LIDOCAINE-EPINEPHRINE 1 %-1:100000 IJ SOLN
INTRAMUSCULAR | Status: AC
  Filled 2021-02-03: qty 1

## 2021-02-03 MED ORDER — LIDOCAINE-EPINEPHRINE 1 %-1:100000 IJ SOLN
INTRAMUSCULAR | Status: AC | PRN
  Administered 2021-02-03 (×2): 10 mL

## 2021-02-03 MED ORDER — MIDAZOLAM HCL 2 MG/2ML IJ SOLN
INTRAMUSCULAR | Status: AC
  Filled 2021-02-03: qty 4

## 2021-02-03 MED ORDER — FENTANYL CITRATE (PF) 100 MCG/2ML IJ SOLN
INTRAMUSCULAR | Status: AC
  Filled 2021-02-03: qty 2

## 2021-02-03 NOTE — Discharge Instructions (Signed)
Please call Interventional Radiology clinic [phone] with any questions or concerns.  You may remove your dressing and shower tomorrow.  DO NOT use EMLA cream for 2 weeks after port placement as this cream will remove surgical glue on your incision.   Implanted Port Insertion, Care After This sheet gives you information about how to care for yourself after your procedure. Your health care provider may also give you more specific instructions. If you have problems or questions, contact your health care provider. What can I expect after the procedure? After the procedure, it is common to have:  Discomfort at the port insertion site.  Bruising on the skin over the port. This should improve over 3-4 days. Follow these instructions at home: Port care  After your port is placed, you will get a manufacturer's information card. The card has information about your port. Keep this card with you at all times.  Take care of the port as told by your health care provider. Ask your health care provider if you or a family member can get training for taking care of the port at home. A home health care nurse may also take care of the port.  Make sure to remember what type of port you have. Incision care  Follow instructions from your health care provider about how to take care of your port insertion site. Make sure you: ? Wash your hands with soap and water before and after you change your bandage (dressing). If soap and water are not available, use hand sanitizer. ? Change your dressing as told by your health care provider. ? Leave stitches (sutures), skin glue, or adhesive strips in place. These skin closures may need to stay in place for 2 weeks or longer. If adhesive strip edges start to loosen and curl up, you may trim the loose edges. Do not remove adhesive strips completely unless your health care provider tells you to do that.  Check your port insertion site every day for signs of infection.  Check for: ? Redness, swelling, or pain. ? Fluid or blood. ? Warmth. ? Pus or a bad smell.      Activity  Return to your normal activities as told by your health care provider. Ask your health care provider what activities are safe for you.  Do not lift anything that is heavier than 10 lb (4.5 kg), or the limit that you are told, until your health care provider says that it is safe. General instructions  Take over-the-counter and prescription medicines only as told by your health care provider.  Do not take baths, swim, or use a hot tub until your health care provider approves. Ask your health care provider if you may take showers. You may only be allowed to take sponge baths.  Do not drive for 24 hours if you were given a sedative during your procedure.  Wear a medical alert bracelet in case of an emergency. This will tell any health care providers that you have a port.  Keep all follow-up visits as told by your health care provider. This is important. Contact a health care provider if:  You cannot flush your port with saline as directed, or you cannot draw blood from the port.  You have a fever or chills.  You have redness, swelling, or pain around your port insertion site.  You have fluid or blood coming from your port insertion site.  Your port insertion site feels warm to the touch.  You have pus or a   bad smell coming from the port insertion site. Get help right away if:  You have chest pain or shortness of breath.  You have bleeding from your port that you cannot control. Summary  Take care of the port as told by your health care provider. Keep the manufacturer's information card with you at all times.  Change your dressing as told by your health care provider.  Contact a health care provider if you have a fever or chills or if you have redness, swelling, or pain around your port insertion site.  Keep all follow-up visits as told by your health care  provider. This information is not intended to replace advice given to you by your health care provider. Make sure you discuss any questions you have with your health care provider. Document Revised: 06/28/2018 Document Reviewed: 06/28/2018 Elsevier Patient Education  2021 Elsevier Inc.    Moderate Conscious Sedation, Adult, Care After This sheet gives you information about how to care for yourself after your procedure. Your health care provider may also give you more specific instructions. If you have problems or questions, contact your health care provider. What can I expect after the procedure? After the procedure, it is common to have:  Sleepiness for several hours.  Impaired judgment for several hours.  Difficulty with balance.  Vomiting if you eat too soon. Follow these instructions at home: For the time period you were told by your health care provider:  Rest.  Do not participate in activities where you could fall or become injured.  Do not drive or use machinery.  Do not drink alcohol.  Do not take sleeping pills or medicines that cause drowsiness.  Do not make important decisions or sign legal documents.  Do not take care of children on your own.      Eating and drinking  Follow the diet recommended by your health care provider.  Drink enough fluid to keep your urine pale yellow.  If you vomit: ? Drink water, juice, or soup when you can drink without vomiting. ? Make sure you have little or no nausea before eating solid foods.   General instructions  Take over-the-counter and prescription medicines only as told by your health care provider.  Have a responsible adult stay with you for the time you are told. It is important to have someone help care for you until you are awake and alert.  Do not smoke.  Keep all follow-up visits as told by your health care provider. This is important. Contact a health care provider if:  You are still sleepy or having  trouble with balance after 24 hours.  You feel light-headed.  You keep feeling nauseous or you keep vomiting.  You develop a rash.  You have a fever.  You have redness or swelling around the IV site. Get help right away if:  You have trouble breathing.  You have new-onset confusion at home. Summary  After the procedure, it is common to feel sleepy, have impaired judgment, or feel nauseous if you eat too soon.  Rest after you get home. Know the things you should not do after the procedure.  Follow the diet recommended by your health care provider and drink enough fluid to keep your urine pale yellow.  Get help right away if you have trouble breathing or new-onset confusion at home. This information is not intended to replace advice given to you by your health care provider. Make sure you discuss any questions you have with your health   care provider. Document Revised: 03/29/2020 Document Reviewed: 10/26/2019 Elsevier Patient Education  2021 Elsevier Inc.  

## 2021-02-03 NOTE — Procedures (Signed)
Pre Procedure Dx: Metastatic appendiceal carcinoma Post Procedural Dx: Same  Successful placement of right IJ approach port-a-cath with tip at the superior caval atrial junction. The catheter is ready for immediate use.  Estimated Blood Loss: Minimal  Complications: None immediate.  Ronny Bacon, MD Pager #: [phone]

## 2021-02-03 NOTE — H&P (Signed)
Referring Physician(s): Feng,Yan  Supervising Physician: Sandi Mariscal  Patient Status:  [facility] OP  Chief Complaint: "I'm getting a port a cath"   Subjective: Patient familiar to IR service from left upper quadrant and left lower quadrant abdominal drain placements on 03/20/2020 and right thoracentesis on 04/23/2020. She has a history of colorectal cancer diagnosed in March 2021 with prior low anterior resection/ostomy. She has also had appendiceal carcinoma.  PET scan performed on 01/28/2021 revealed:  1. Extensive hypermetabolic peritoneal metastasis, as detailed above. 2. No evidence of hypermetabolic supradiaphragmatic disease. 3. Diffuse low-level thyroid hypermetabolism can be seen in the setting of thyroiditis. Consider correlation with thyroid function labs. 4. Aortic atherosclerosis (ICD10-I70.0) and emphysema  She presents today for Port-A-Cath placement to assist with treatment.  She currently denies fever, headache, chest pain, back pain, nausea, vomiting or bleeding.  She does have chronic dyspnea, occasional cough and intermittent abdominal discomfort.    Past Medical History:  Diagnosis Date  . Acute deep vein thrombosis (DVT) of brachial vein of right upper extremity ([place])   . Acute on chronic respiratory failure with hypoxia ([place])   . Asthma   . Cataract   . CHICKENPOX, HX OF 01/06/2011   Qualifier: Diagnosis of  By: Charlett Blake MD, Erline Levine    . COPD (chronic obstructive pulmonary disease) ([place]) 2011   FeV1 31% predicted FeV1/FVX 47 %  . Essential hypertension 05/23/2020  . Hemorrhoid   . Open right radial fracture 2020  . Osteopenia   . Perforated sigmoid colon ([place]) 03/03/2020  . Pleural effusion   . Pneumonia 2021  . Postoperative intra-abdominal abscess 2021  . Rectal cancer ([place])   . Seasonal allergies    takes Claritin daily prn  . Shock circulatory ([place])   . Tracheostomy status ([place])   . Vitamin D deficiency    takes Vit d every 14 days   Past Surgical  History:  Procedure Laterality Date  . AUGMENTATION MAMMAPLASTY     saline  . EXAMINATION UNDER ANESTHESIA  10/14/2012   Procedure: EXAM UNDER ANESTHESIA;  Surgeon: Gayland Curry, MD,FACS;  Location: [facility];  Service: General;  Laterality: N/A;  rectal exam under anesthesia excisional hemorrhoidectomy hemorrhoidal banding x two  . EXAMINATION UNDER ANESTHESIA  02/03/2013   excision hemorrhoidal tissue  . FOOT SURGERY     left bunionectomy  . HEMORRHOIDECTOMY WITH HEMORRHOID BANDING  10/14/2012   Procedure: HEMORRHOIDECTOMY WITH HEMORRHOID BANDING;  Surgeon: Gayland Curry, MD,FACS;  Location: [facility];  Service: General;  Laterality: N/A;  . IR THORACENTESIS ASP PLEURAL SPACE W/IMG GUIDE  04/23/2020  . LAPAROTOMY N/A 03/03/2020   Procedure: low anterior resection end colostomy;  Surgeon: [name], MD;  [facility];  Service: General;  Laterality: N/A;  . LAPAROTOMY N/A 03/12/2020   Procedure: EXPLORATORY LAPAROTOMY WITH BOWEL RESECTION AND COLOSTOMY;  Surgeon: [name], MD;  [facility];  Service: General;  Laterality: N/A;  . OPEN REDUCTION INTERNAL FIXATION (ORIF) DISTAL RADIAL FRACTURE Right 11/01/2018   Procedure: OPEN REDUCTION INTERNAL FIXATION (ORIF) DISTAL RADIAL FRACTURE;  Surgeon: [name], MD;  Location: [facility];  Service: Orthopedics;  Laterality: Right;  . TONSILLECTOMY  1960   recurrent otitis media  . US ECHOCARDIOGRAPHY  02/2020    poor windows, normal LV function, severely dilated RV with moderately reduced function, RV volume and pressure overload, mildly dilated RA     Allergies: Amlodipine and Codeine  Medications: Prior to Admission medications   Medication Sig  Start Date End Date Taking? Authorizing Provider  albuterol (PROVENTIL) (2.5 MG/3ML) 0.083% nebulizer solution Take 3 mLs (2.5 mg total) by nebulization every 4 (four) hours as needed for wheezing or shortness of breath. DX: J44.9 12/17/20  Yes Icard, Octavio Graves, DO   ALPRAZolam (XANAX) 0.25 MG tablet Take 1 tablet (0.25 mg total) by mouth at bedtime as needed for anxiety. 07/12/20  Yes [name], MD  capecitabine (XELODA) 500 MG tablet Take 3 tablets (1,500 mg total) by mouth 2 (two) times daily after a meal. Take for 14 days on, 7 days off, repeat every 21 days. 01/28/21  Yes [name], MD  diphenhydrAMINE (BENADRYL) 25 mg capsule Take 25 mg by mouth every 6 (six) hours as needed.   Yes [provider]  docusate sodium (COLACE) 50 MG capsule Take 50 mg by mouth daily as needed for mild constipation.   Yes [provider]  fluticasone (FLONASE) 50 MCG/ACT nasal spray Place 2 sprays into both nostrils daily. 05/29/20  Yes Parrett, Tammy S, NP  Fluticasone-Umeclidin-Vilant (TRELEGY ELLIPTA) 100-62.5-25 MCG/INH AEPB Inhale 1 puff into the lungs daily. 09/03/20  Yes Icard, Bradley L, DO  albuterol (VENTOLIN HFA) 108 (90 Base) MCG/ACT inhaler Inhale 2 puffs into the lungs every 6 (six) hours as needed for wheezing or shortness of breath. 09/03/20   [name], DO     Vital Signs:pending  LMP 12/14/2000   Physical Exam awake, alert.  Chest with distant breath sounds bilaterally.  Heart with regular rate and rhythm.  Abdomen soft, positive bowel sounds, intact ostomy, mild tenderness to palpation, no lower extremity edema.  Imaging: No results found.  Labs:  CBC: Recent Labs    11/25/20 0929 01/15/21 1605 01/29/21 0731 02/03/21 1322  WBC 7.8 7.6 10.9* 8.8  HGB 13.5 13.7 14.6 14.7  HCT 41.6 43.2 45.8 44.8  PLT 372 276 354 392    COAGS: Recent Labs    03/09/20 1752  INR 1.3*    BMP: Recent Labs    04/29/20 0431 04/29/20 0431 06/06/20 0957 07/12/20 1407 08/27/20 0857 11/25/20 0929 01/14/21 1309 01/15/21 1605 01/29/21 0731  NA 135  --  141 142 141 139  --  140 142  K 3.5  --  3.3* 3.7 4.5 4.4  --  4.3 4.7  CL 101  --  105 104 104 105  --  104 106  CO2 28  --  26 28 28 27   --  26 26  GLUCOSE 123*  --  92 94 90 86   --  110* 89  BUN 13  --  11 9 16 12   --  18 17  CALCIUM 8.4*  --  9.5 10.1 10.0 9.6  --  9.4 9.6  CREATININE 0.43*   < > 0.69 0.78 0.91 1.00 0.90 0.89 1.14*  GFRNONAA >60   < > >60 >60 >60 >60  --  >60 53*  GFRAA >60  --  >60 >60 >60  --   --   --   --    < > = values in this interval not displayed.    LIVER FUNCTION TESTS: Recent Labs    08/27/20 0857 11/25/20 0929 01/15/21 1605 01/29/21 0731  BILITOT 0.4 0.4 0.7 0.4  AST 23 25 26 17   ALT 17 17 20 13   ALKPHOS 123 105 83 107  PROT 7.8 7.2 7.1 7.7  ALBUMIN 4.1 4.0 4.3 4.2    Assessment and Plan: Patient familiar to IR service from  left upper quadrant and left lower quadrant abdominal drain placements on 03/20/2020 and right thoracentesis on 04/23/2020. She has a history of colorectal cancer diagnosed in March 2021 with prior low anterior resection/ostomy. She has also had appendiceal carcinoma.  PET scan performed on 01/28/2021 revealed:  1. Extensive hypermetabolic peritoneal metastasis, as detailed above. 2. No evidence of hypermetabolic supradiaphragmatic disease. 3. Diffuse low-level thyroid hypermetabolism can be seen in the setting of thyroiditis. Consider correlation with thyroid function labs. 4. Aortic atherosclerosis (ICD10-I70.0) and emphysema  She presents today for Port-A-Cath placement to assist with treatment. Risks and benefits of image guided port-a-catheter placement was discussed with the patient including, but not limited to bleeding, infection, pneumothorax, or fibrin sheath development and need for additional procedures.  All of the patient's questions were answered, patient is agreeable to proceed. Consent signed and in chart.     Electronically Signed: D. Rowe Robert, PA-C 02/03/2021, 1:39 PM   I spent a total of 25 minutes at the the patient's bedside AND on the patient's hospital floor or unit, greater than 50% of which was counseling/coordinating care for Port-A-Cath placement

## 2021-02-04 NOTE — Telephone Encounter (Signed)
Patient is approved for Xeloda at no cost from [facility] 02/03/21-until.  [place] Patient [place] Phone [phone] Fax [phone] 02/04/2021 9:15 AM

## 2021-02-05 ENCOUNTER — Telehealth: Payer: Self-pay | Admitting: Family Medicine

## 2021-02-05 NOTE — Telephone Encounter (Signed)
Attempted to schedule AWV. Unable to LVM.  Will try at later time.   Called patient to schedule Annual Wellness Visit.  Please schedule with Nurse Health Advisor Martha Stanley, RN at [facility]  

## 2021-02-05 NOTE — Telephone Encounter (Signed)
Patient returning call.  She declined scheduling appt.  She stated that she has already had her medicare well visit for this year.  Another conversation not related to this call. She requested a doctors note for [employer] for husband.  Her husband is not a patient here.  I told her Dr.Kuneff is not going to write a note for a non patient to get out of jury duty. She stated that she is taking chemo and he is her care taker.  To give patient another option, I told her to call her oncologist to see if they would provide a note for patient's spouse to get excused for jury duty.

## 2021-02-10 ENCOUNTER — Other Ambulatory Visit: Payer: Self-pay

## 2021-02-10 DIAGNOSIS — Z933 Colostomy status: Secondary | ICD-10-CM | POA: Diagnosis not present

## 2021-02-10 DIAGNOSIS — C181 Malignant neoplasm of appendix: Secondary | ICD-10-CM

## 2021-02-10 MED ORDER — CAPECITABINE 500 MG PO TABS: 1000.0000 mg/m2 | ORAL_TABLET | Freq: Two times a day (BID) | ORAL | 0 refills | Status: DC

## 2021-02-10 NOTE — Progress Notes (Addendum)
[facility]   [phone] Fax:([phone]   Clinic Follow up Note   Patient Care Team: Natalia Leatherwood, DO as PCP - General (Family Medicine) Radonna Ricker, RN as Oncology Nurse Navigator Malachy Mood, MD as Consulting Physician (Hematology) Icard, Rachel Bo, DO as Consulting Physician (Pulmonary Disease) [facility] Luretha Murphy, MD as Consulting Physician (General Surgery) Romie Levee, MD as Consulting Physician (General Surgery) [name], Willaim Rayas, MD as Consulting Physician (Gastroenterology)  Date of Service:  02/13/2021  CHIEF COMPLAINT: F/u of rectal cancer  SUMMARY OF ONCOLOGIC HISTORY: Oncology History Overview Note  Cancer Staging Rectal cancer [facility]) Staging form: Colon and Rectum, AJCC 8th Edition - Pathologic stage from 03/03/2020: Stage IIIB (pT4a, pN1b, cM0) - Signed by Malachy Mood, MD on 01/28/2021 Stage prefix: Initial diagnosis Histologic grading system: 4 grade system Histologic grade (G): G2 Residual tumor (R): R0 - None    Rectal cancer (HCC)  03/03/2020 - 04/05/2020 Hospital Admission   She was admitted to ED on 03/03/20 for abdominal pain and nausea. She had been having diarrhea for several weeks. During hospital stay she developed acute respiratory failure, AKI, RUE DVT from PICC line. Work up showed bowel perforation, small left liver mass and thickening of jejunum. She underwent emergent surgery on 03/03/20 for resection and colostomy placement. Her path showed invasive cancer, metastatic to 3/15 LNs. She had a NGT in placed but this was removed on POD 3. Post op her stoma became necrotic and septic. She had another bowel surgery on 03/12/20, which was NED with necrotic tissue.    03/03/2020 Imaging   CT AP 03/03/20  IMPRESSION: 1. Free intraperitoneal air consistent perforation. Most likely site of perforation is in the distal splenic flexure/proximal descending colon where there is a collection of air  and gas measuring 6 centimeters. No obvious soft tissue mass identified in this region. At this site, there is abrupt transition of dilated, stool-filled colon to completely decompressed proximal descending colon. 2. Thickened, inflamed loops of jejunum are identified within the pelvis and are likely reactive. 3. Small hiatal hernia. 4. Benign-appearing 1.6 centimeter mass the LEFT hepatic lobe. Recommend comparison with prior studies if available. 5.  Emphysema (ICD10-J43.9). 6.  Aortic Atherosclerosis (ICD10-I70.0). 7. Bilateral renal scarring.   03/03/2020 Surgery   low anterior resection end colostomy by Dr Maisie Fus    03/03/2020 Initial Biopsy   FINAL MICROSCOPIC DIAGNOSIS: 03/03/20 A. RECTOSIGMOID COLON, LOW ANTERIOR RESECTION:  - Invasive colonic adenocarcinoma, 3.5 cm.  - Tumor invades the visceral peritoneum.  - Margins of resection are not involved.  - Metastatic carcinoma in (3) of (15) lymph nodes.  - See oncology table.   B. ADDITIONAL SIGMOID COLON, RESECTION:  - Colonic tissue, negative for carcinoma.  ADDENDUM:  Mismatch Repair Protein (IHC)   SUMMARY INTERPRETATION: NORMAL  There is preserved expression of the major MMR proteins. There is a very  low probability that microsatellite instability (MSI) is present.  However, certain clinically significant MMR protein mutations may result  in preservation of nuclear expression. It is recommended that the  preservation of protein expression be correlated with molecular based  MSI testing.   IHC EXPRESSION RESULTS  TEST           RESULT  MLH1:          Preserved nuclear expression  MSH2:          Preserved nuclear expression  MSH6:          Preserved  nuclear expression  PMS2:          Preserved nuclear expression   03/03/2020 Cancer Staging   Staging form: Colon and Rectum, AJCC 8th Edition - Pathologic stage from 03/03/2020: Stage IIIB (pT4a, pN1b, cM0) - Signed by [name], MD on 01/28/2021 Stage prefix: Initial  diagnosis Histologic grading system: 4 grade system Histologic grade (G): G2 Residual tumor (R): R0 - None   03/08/2020 Imaging   CT AP 03/08/20 IMPRESSION: 1. Interval midline laparotomy with distal colon resection and diverting left lower quadrant colostomy. 2. Diffuse small bowel dilatation with gas fluid levels most consistent with postoperative ileus. 3. Trace ascites within the abdomen and pelvis. No fluid collection or abscess at this time. Surgical drain within the lower pelvis. 4. Indeterminate 1.4 cm subcapsular liver hypodensity. In light of newly diagnosed rectal cancer, metastatic disease cannot be excluded. PET CT may be useful for further evaluation. 5. Interval development of trace bilateral pleural effusions and diffuse body wall edema.   03/12/2020 Surgery   EXPLORATORY LAPAROTOMY WITH BOWEL RESECTION AND COLOSTOMY by Dr Hassell Done 03/12/20  FINAL MICROSCOPIC DIAGNOSIS: 03/12/20  A. COLON, SPLENIC FLEXURE, RESECTION:  - Segment of colon (37 cm) with perforation and associated inflammation  - Multiple mucosal ulcers with necrotizing inflammation  - No evidence of malignancy    03/12/2020 Imaging   CT AP WO contrast 03/12/20  IMPRESSION: 1. Exam is limited by lack of intravenous contrast, motion, and artifact from the patient's arms adjacent to the torso. Since 03/08/2020, there has been development of relatively large pockets of intraperitoneal free air. While intraperitoneal gas would not be unexpected on postoperative day 9, this gas is new since an intervening study of 03/08/2020 and given the relatively large volume certainly raises concern for bowel perforation. No source for the intraperitoneal free air is evident on this study. There is a surgical drain in the pelvis, but there is no free gas around the drain itself to suggest that it represents the source. 2. New circumferential wall thickening in the splenic flexure, descending colon and sigmoid colon  leading into the end colostomy. Infection/inflammation would be a consideration. Ischemia cannot be excluded. 3. Relatively small volume intraperitoneal free fluid. High attenuation small fluid collections in the left upper abdomen may reflect hemorrhage, infection or residua from prior perforation. 4. Interval progression of diffuse body wall edema. 5. Residual contrast material in the renal parenchyma from prior imaging, compatible with renal dysfunction.    03/19/2020 Imaging   CT CAP 03/19/20  IMPRESSION: 1. 5.5 x 3.2 cm fluid collection is noted along the greater curvature of the proximal stomach. 2. Surgical drain is again noted in the pelvis with tip in left lower quadrant. 3. Interval development of crescent-shaped fluid collection measuring 16.4 x 3.3 cm in the epigastric region and left upper quadrant of the abdomen which may extend into the left lower quadrant. Potentially this may represent abscess or developing abscess. Multiple other smaller fluid collections are noted which may represent small abscesses. 4. 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 5. Mild amount of free fluid is noted in the posterior pelvis. 6. Moderate anasarca is noted. Aortic Atherosclerosis (ICD10-I70.0).   04/12/2020 Imaging   CT AP 04/12/20  IMPRESSION: 1. Fluid collections within the LEFT abdomen have significantly decreased compared to previous CT exams, now nearly completely resolved. 2. Percutaneous drainage catheter with tip coiled posterior to the LEFT kidney, stable positioning compared to the previous study. The more anterior catheter has been removed.  3. Small amount of free fluid persists within the abdomen and pelvis. 4. Trace bibasilar pleural effusions. 5. Anasarca. 6. While reviewing today's study, comparing with a chest/abdomen/pelvis CT from earlier same month, there is question of thrombus in the LEFT internal jugular vein. Recommend ultrasound of the LEFT IJ to  exclude DVT. This recommendation discussed with patient's hospitalist, Dr. Owens Shark, on 04/12/2020 at 4:20 p.m.   Emphysema (ICD10-J43.9).   04/22/2020 Imaging   CT AP 04/22/20  IMPRESSION: 1. No recurrent intra-abdominal abscess status post interval removal of left retroperitoneal percutaneous drain. Stable small amount of free pelvic fluid. 2. Enlarging dependent bilateral pleural effusions, now moderate in volume. Associated atelectasis at both lung bases. 3. Progressive anasarca with generalized edema throughout the subcutaneous fat. 4. Stable small subcapsular fluid collection along the anterior aspect of the left hepatic lobe. 5. Aortic Atherosclerosis (ICD10-I70.0).   05/23/2020 Initial Diagnosis   Rectal cancer ([place])   01/14/2021 Imaging   CT CAP IMPRESSION: -5.0 cm soft tissue mass along the posterior aspect of the cecum at the base of the appendix, favored to reflect a peritoneal/serosal implant, corresponding to the patient's known adenocarcinoma. This is new from the prior. -Suspected additional pelvic implants along the uterus and right adnexa, poorly visualized, new/progressive from the prior. -Additional pericapsular lesion along the lateral spleen is mildly progressive, suspicious for additional peritoneal implant. -No evidence of metastatic disease in the chest.   01/27/2021 PET scan   IMPRESSION:  1. Extensive hypermetabolic peritoneal metastasis, as detailed  above.  2. No evidence of hypermetabolic supradiaphragmatic disease.  3. Diffuse low-level thyroid hypermetabolism can be seen in the  setting of thyroiditis. Consider correlation with thyroid function  labs.  4. Aortic atherosclerosis (ICD10-I70.0) and emphysema (ICD10-J43.9).    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 - Polypoid lesion  obliterating appendiceal orifice. Biopsied to evaluate for malignancy. - The examined portion of the ileum was normal. - Two large polyps in the cecum, not removed today pending path at appendiceal orifice, bowel prep. If removed endoscopically in the future would favor EMR to be done at the hospital. - One 5 mm polyp at the ileocecal valve, removed with a cold snare. Resected and retrieved. - One 3 mm polyp in the transverse colon, removed with a cold snare. Resected and retrieved. - Parastomal hernia making initial entry to the distal colon challenging. - The examination was otherwise normal.   01/06/2021 Initial Biopsy   Diagnosis 1. Colon, biopsy, appendiceal oraface - ADENOCARCINOMA. 2. Colon, polyp(s), ileocecal valve, transverse, x2 - TUBULAR ADENOMA, NEGATIVE FOR HIGH GRADE DYSPLASIA (X1). - COLONIC MUCOSA WITH UNDERLYING LYMPHOID AGGREGATE, NEGATIVE FOR DYSPLASIA (X1).  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 This is new from the prior. -Suspected additional pelvic implants along the uterus and right adnexa, poorly visualized, new/progressive from the prior. -Additional pericapsular lesion along the lateral spleen is mildly progressive, suspicious for additional peritoneal implant. -No evidence of metastatic disease in the chest.     CURRENT THERAPY:  First line chemo Xeloda 1500 mg twice daily for day 1-14, every 21 days, started on  02/01/2021, change to day 1-7 every 14 days and add bevacizumab from second cycle 3/14  INTERVAL HISTORY:  LEQUITA MEADOWCROFT is here for a follow up. She presents to the clinic with her husband. She started Xeloda on February 01, 2021.  She has been tolerating well  overall, no nausea, diarrhea, skin toxicity.  She does notice more fatigue, her husband feels her energy level has dropped by half since she started chemo, especially from day 5 or 6.  She takes naps during the day, still able to get up and move around at home, does little light housework, and spends most time in couch during day. No pain or other complains.   All other systems were reviewed with the patient and are negative.  MEDICAL HISTORY:  Past Medical History:  Diagnosis Date  . Acute deep vein thrombosis (DVT) of brachial vein of right upper extremity ([place])   . Acute on chronic respiratory failure with hypoxia ([place])   . Asthma   . Cataract   . CHICKENPOX, HX OF 01/06/2011   Qualifier: Diagnosis of  By: Charlett Blake MD, Erline Levine    . COPD (chronic obstructive pulmonary disease) ([place]) 2011   FeV1 31% predicted FeV1/FVX 47 %  . Essential hypertension 05/23/2020  . Hemorrhoid   . Open right radial fracture 2020  . Osteopenia   . Perforated sigmoid colon ([place]) 03/03/2020  . Pleural effusion   . Pneumonia 2021  . Postoperative intra-abdominal abscess 2021  . Rectal cancer ([place])   . Seasonal allergies    takes Claritin daily prn  . Shock circulatory ([place])   . Tracheostomy status ([place])   . Vitamin D deficiency    takes Vit d every 14 days    SURGICAL HISTORY: Past Surgical History:  Procedure Laterality Date  . AUGMENTATION MAMMAPLASTY     saline  . EXAMINATION UNDER ANESTHESIA  10/14/2012   Procedure: EXAM UNDER ANESTHESIA;  Surgeon: Gayland Curry, MD,FACS;  Location: [place];  Service: General;  Laterality: N/A;  rectal exam under anesthesia excisional hemorrhoidectomy hemorrhoidal banding x two  . EXAMINATION UNDER ANESTHESIA  02/03/2013   excision hemorrhoidal tissue  . FOOT SURGERY     left bunionectomy  . HEMORRHOIDECTOMY WITH HEMORRHOID BANDING  10/14/2012   Procedure: HEMORRHOIDECTOMY WITH HEMORRHOID BANDING;  Surgeon: Gayland Curry, MD,FACS;  Location: [place];  Service: General;   Laterality: N/A;  . IR IMAGING GUIDED PORT INSERTION  02/03/2021  . IR THORACENTESIS ASP PLEURAL SPACE W/IMG GUIDE  04/23/2020  . LAPAROTOMY N/A 03/03/2020   Procedure: low anterior resection end colostomy;  Surgeon: [name], MD;  Location: [facility];  Service: General;  Laterality: N/A;  . LAPAROTOMY N/A 03/12/2020   Procedure: EXPLORATORY LAPAROTOMY WITH BOWEL RESECTION AND COLOSTOMY;  Surgeon: [name], MD;  Location: [facility];  Service: General;  Laterality: N/A;  . OPEN REDUCTION INTERNAL FIXATION (ORIF) DISTAL RADIAL FRACTURE Right 11/01/2018   Procedure: OPEN REDUCTION INTERNAL FIXATION (ORIF) DISTAL RADIAL FRACTURE;  Surgeon: [name], MD;  Location: [place];  Service: Orthopedics;  Laterality: Right;  . TONSILLECTOMY  1960   recurrent otitis media  . US ECHOCARDIOGRAPHY  02/2020    poor windows, normal LV function, severely dilated RV with moderately reduced function, RV volume and pressure overload, mildly dilated RA    I have reviewed the social history and family history with the patient and they are unchanged from previous note.  ALLERGIES:  is allergic to amlodipine and codeine.  MEDICATIONS:  Current Outpatient Medications  Medication Sig Dispense Refill  . lidocaine-prilocaine (EMLA) cream Apply 1 application topically as needed. 30 g 0  . albuterol (PROVENTIL) (2.5 MG/3ML) 0.083% nebulizer solution Take 3 mLs (2.5 mg total) by nebulization every 4 (four) hours as needed for wheezing or shortness of breath. DX: J44.9 360 mL 5  . albuterol (VENTOLIN HFA) 108 (90 Base) MCG/ACT inhaler Inhale 2 puffs into the lungs every 6 (six) hours as needed for wheezing or shortness of breath. 8 g 5  . ALPRAZolam (XANAX) 0.25 MG tablet Take 1 tablet (0.25 mg total) by mouth at bedtime as needed for anxiety. 30 tablet 0  . capecitabine (XELODA) 500 MG tablet Take 3 tablets (1,500 mg total) by mouth 2 (two) times daily after a meal. Take for 7 days on, 7 days off,  repeat every 14 days. 84 tablet 0  . diphenhydrAMINE (BENADRYL) 25 mg capsule Take 25 mg by mouth every 6 (six) hours as needed.    . docusate sodium (COLACE) 50 MG capsule Take 50 mg by mouth daily as needed for mild constipation.    . fluticasone (FLONASE) 50 MCG/ACT nasal spray Place 2 sprays into both nostrils daily. 16 mL 5  . Fluticasone-Umeclidin-Vilant (TRELEGY ELLIPTA) 100-62.5-25 MCG/INH AEPB Inhale 1 puff into the lungs daily. 60 each 5   No current facility-administered medications for this visit.    PHYSICAL EXAMINATION: ECOG PERFORMANCE STATUS: 3 - Symptomatic, >50% confined to bed  Vitals:   02/13/21 1111  BP: (!) 148/88  Pulse: 96  Resp: 18  Temp: (!) 97 F (36.1 C)  SpO2: 91%   Filed Weights   02/13/21 1111  Weight: 131 lb 8 oz (59.6 kg)    GENERAL:alert, no distress and comfortable SKIN: skin color, texture, turgor are normal, no rashes or significant lesions EYES: normal, Conjunctiva are pink and non-injected, sclera clear NECK: supple, thyroid normal size, non-tender, without nodularity LYMPH:  no palpable lymphadenopathy in the cervical, axillary  LUNGS: clear to auscultation and percussion with normal breathing effort HEART: regular rate & rhythm and no murmurs and no lower extremity edema ABDOMEN:abdomen soft, non-tender and normal bowel sounds Musculoskeletal:no cyanosis of digits and no clubbing  NEURO: alert & oriented x 3 with fluent speech, no focal motor/sensory deficits  LABORATORY DATA:  I have reviewed the data as listed CBC Latest Ref Rng & Units 02/13/2021 02/03/2021 01/29/2021  WBC 4.0 - 10.5 K/uL 8.5 8.8 10.9(H)  Hemoglobin 12.0 - 15.0 g/dL 14.0 14.7 14.6  Hematocrit 36.0 - 46.0 % 43.0 44.8 45.8  Platelets 150 - 400 K/uL 381 392 354     CMP Latest Ref Rng & Units 02/13/2021 01/29/2021 01/15/2021  Glucose 70 - 99 mg/dL 102(H) 89 110(H)  BUN 8 - 23 mg/dL 24(H) 17 18  Creatinine 0.44 - 1.00 mg/dL 0.98 1.14(H) 0.89  Sodium 135 - 145 mmol/L 141  142 140  Potassium 3.5 - 5.1 mmol/L 4.7 4.7 4.3  Chloride 98 - 111 mmol/L 104 106 104  CO2 22 - 32 mmol/L $RemoveB'26 26 26  'QfzkhXPJ$ Calcium 8.9 - 10.3 mg/dL 9.7 9.6 9.4  Total Protein 6.5 - 8.1 g/dL 7.8 7.7 7.1  Total Bilirubin 0.3 - 1.2 mg/dL 0.4 0.4 0.7  Alkaline Phos 38 - 126 U/L 103 107 83  AST 15 - 41 U/L 33 17 26  ALT 0 - 44 U/L $Remo'21 13 20      'WbxNT$ RADIOGRAPHIC STUDIES: I have personally reviewed the radiological images as listed and agreed with the  findings in the report. No results found.   ASSESSMENT & PLAN:  Tracy [name] is a 70 y.o. female with   1. Rectosigmoid cancer(perforated),Stage III,pT4aN1b,with peroration,MSI Stable, peritoneal metastasis in 01/2021  -She was diagnosed in 02/2020.Workup showed she had bowel perforation and underwent emergent surgery. Pathology showed invasiverectosigmoidadenocarcinoma and perforatedbowel with3/15positiveLN.Margins were clear. She haspermanentcolostomy bag in place. Her initial CT scan was negative for distant mets. -She hasstage III colon cancer and with bowel perforation she hasveryhigh risk of cancer recurrence,unfortunately she was not a candidate for adjuvant chemo after surgery due to very slow recovery.  -She did opt to proceed withGuardant Revealstudy in 06/06/20, 07/12/20 and 09/10/20 which were all negative.  -Unfortunately her screening colonoscopy on January 06, 2021 which showed a mass in the appendiceal orifice and biopsy confirmed adenocarcinoma. 01/27/21 PET showed tumor in appendiceal orifice is likely peritoneal met growing into cecum.  -Her peritoneal metastasis is mainly in the pelvis, difficult to biopsy.  Her PET scan is pretty convincing for metastatic disease, I do not strongly feel she needs biopsy to confirm.  -I recommended first line CAPOX q3weeks with Xeloda 1500 mg twice daily, on day 1-14 every 21 days, to control her disease and prolong her life. Started first cycle with Xeloda only.  She tolerated well, with  moderate fatigue.  Due to her poor performance status, I recommend changing Xeloda to 1 week on and 1 week off from cycle 2, and add Avastin.  If she tolerates this very well, may consider dose oxaliplatin from cycle 3 or 4. -I discussed the potential side effect from bevacizumab, especially hypertension, proteinuria, thrombosis, hemorrhage, bowel perforation, etc, she voiced good understanding and agrees to proceed. -FO results still pending. -will return on 3/14 for cycle 2    2.ProvokedRUE DVTon 04/04/20. S/p 3 months of Eliquis  3. Comorbidities: Asthma, COPD, Anxiety -On nebulizer, Cymbalta andXanax 0.25mg  1-2 daily, Benadryl and Flonase  -Continue to f/u with pulmonologist and she can request rescue inhaler from them.   4. Goal of care discussion  -We again discussed the incurable nature of her cancer, and the overall poor prognosis, especially if she does not have good response to chemotherapy or progress on chemo -The patient understands the goal of care is palliative. -she is full code for now    PLAN: -Conitnue first cycle Xeloda 1500 mg twice daily until tomorrow to complete 14 days treatment, then off -Return on February 24, 2021 for cycle 2.  Due to her fatigue, will change Xeloda to 1500 mg twice daily 7 days on, and 7 days off.  Plan to add Avastin from cycle 2.  No problem-specific Assessment & Plan notes found for this encounter.   No orders of the defined types were placed in this encounter.  All questions were answered. The patient knows to call the clinic with any problems, questions or concerns. No barriers to learning was detected. The total time spent in the appointment was 30 minutes.     [name], MD 02/13/2021   I, Joslyn Devon, am acting as scribe for [name], MD.   I have reviewed the above documentation for accuracy and completeness, and I agree with the above.

## 2021-02-11 DIAGNOSIS — Z933 Colostomy status: Secondary | ICD-10-CM | POA: Diagnosis not present

## 2021-02-12 DIAGNOSIS — Z933 Colostomy status: Secondary | ICD-10-CM | POA: Diagnosis not present

## 2021-02-13 ENCOUNTER — Inpatient Hospital Stay: Payer: Medicare HMO | Admitting: Hematology

## 2021-02-13 ENCOUNTER — Inpatient Hospital Stay: Payer: Medicare HMO | Attending: Hematology

## 2021-02-13 ENCOUNTER — Encounter: Payer: Self-pay | Admitting: Hematology

## 2021-02-13 ENCOUNTER — Telehealth: Payer: Self-pay | Admitting: Hematology

## 2021-02-13 ENCOUNTER — Other Ambulatory Visit: Payer: Self-pay

## 2021-02-13 VITALS — BP 148/88 | HR 96 | Temp 97.0°F | Resp 18 | Ht 62.0 in | Wt 131.5 lb

## 2021-02-13 DIAGNOSIS — J45909 Unspecified asthma, uncomplicated: Secondary | ICD-10-CM | POA: Diagnosis not present

## 2021-02-13 DIAGNOSIS — C19 Malignant neoplasm of rectosigmoid junction: Secondary | ICD-10-CM | POA: Insufficient documentation

## 2021-02-13 DIAGNOSIS — F419 Anxiety disorder, unspecified: Secondary | ICD-10-CM | POA: Diagnosis not present

## 2021-02-13 DIAGNOSIS — C181 Malignant neoplasm of appendix: Secondary | ICD-10-CM

## 2021-02-13 DIAGNOSIS — Z933 Colostomy status: Secondary | ICD-10-CM | POA: Diagnosis not present

## 2021-02-13 DIAGNOSIS — C786 Secondary malignant neoplasm of retroperitoneum and peritoneum: Secondary | ICD-10-CM | POA: Insufficient documentation

## 2021-02-13 DIAGNOSIS — Z5112 Encounter for antineoplastic immunotherapy: Secondary | ICD-10-CM | POA: Insufficient documentation

## 2021-02-13 DIAGNOSIS — M8580 Other specified disorders of bone density and structure, unspecified site: Secondary | ICD-10-CM | POA: Diagnosis not present

## 2021-02-13 DIAGNOSIS — I10 Essential (primary) hypertension: Secondary | ICD-10-CM | POA: Insufficient documentation

## 2021-02-13 DIAGNOSIS — R69 Illness, unspecified: Secondary | ICD-10-CM | POA: Diagnosis not present

## 2021-02-13 DIAGNOSIS — Z86718 Personal history of other venous thrombosis and embolism: Secondary | ICD-10-CM | POA: Insufficient documentation

## 2021-02-13 DIAGNOSIS — C20 Malignant neoplasm of rectum: Secondary | ICD-10-CM | POA: Diagnosis not present

## 2021-02-13 LAB — CMP (CANCER CENTER ONLY)
ALT: 21 U/L (ref 0–44)
AST: 33 U/L (ref 15–41)
Albumin: 4.4 g/dL (ref 3.5–5.0)
Alkaline Phosphatase: 103 U/L (ref 38–126)
Anion gap: 11 (ref 5–15)
BUN: 24 mg/dL — ABNORMAL HIGH (ref 8–23)
CO2: 26 mmol/L (ref 22–32)
Calcium: 9.7 mg/dL (ref 8.9–10.3)
Chloride: 104 mmol/L (ref 98–111)
Creatinine: 0.98 mg/dL (ref 0.44–1.00)
GFR, Estimated: >60 mL/min (ref >60)
Glucose, Bld: 102 mg/dL — ABNORMAL HIGH (ref 70–99)
Potassium: 4.7 mmol/L (ref 3.5–5.1)
Sodium: 141 mmol/L (ref 135–145)
Total Bilirubin: 0.4 mg/dL (ref 0.3–1.2)
Total Protein: 7.8 g/dL (ref 6.5–8.1)

## 2021-02-13 LAB — CBC WITH DIFFERENTIAL (CANCER CENTER ONLY)
Abs Immature Granulocytes: 0.02 10*3/uL (ref 0.00–0.07)
Basophils Absolute: 0.1 10*3/uL (ref 0.0–0.1)
Basophils Relative: 1 %
Eosinophils Absolute: 0.1 10*3/uL (ref 0.0–0.5)
Eosinophils Relative: 1 %
HCT: 43 % (ref 36.0–46.0)
Hemoglobin: 14 g/dL (ref 12.0–15.0)
Immature Granulocytes: 0 %
Lymphocytes Relative: 23 %
Lymphs Abs: 1.9 10*3/uL (ref 0.7–4.0)
MCH: 32 pg (ref 26.0–34.0)
MCHC: 32.6 g/dL (ref 30.0–36.0)
MCV: 98.4 fL (ref 80.0–100.0)
Monocytes Absolute: 0.7 10*3/uL (ref 0.1–1.0)
Monocytes Relative: 8 %
Neutro Abs: 5.8 10*3/uL (ref 1.7–7.7)
Neutrophils Relative %: 67 %
Platelet Count: 381 10*3/uL (ref 150–400)
RBC: 4.37 MIL/uL (ref 3.87–5.11)
RDW: 12.8 % (ref 11.5–15.5)
WBC Count: 8.5 10*3/uL (ref 4.0–10.5)
nRBC: 0 % (ref 0.0–0.2)

## 2021-02-13 LAB — IRON AND TIBC
Iron: 180 ug/dL — ABNORMAL HIGH (ref 41–142)
Saturation Ratios: 45 % (ref 21–57)
TIBC: 399 ug/dL (ref 236–444)
UIBC: 218 ug/dL (ref 120–384)

## 2021-02-13 LAB — FERRITIN: Ferritin: 78 ng/mL (ref 11–307)

## 2021-02-13 LAB — CEA (IN HOUSE-CHCC): CEA (CHCC-In House): 5.48 ng/mL — ABNORMAL HIGH (ref 0.00–5.00)

## 2021-02-13 MED ORDER — ALPRAZOLAM 0.25 MG PO TABS: 0.2500 mg | ORAL_TABLET | Freq: Every evening | ORAL | 0 refills | Status: DC | PRN

## 2021-02-13 MED ORDER — LIDOCAINE-PRILOCAINE 2.5-2.5 % EX CREA: 1.0000 "application " | TOPICAL_CREAM | CUTANEOUS | 0 refills | Status: DC | PRN

## 2021-02-13 MED ORDER — CAPECITABINE 500 MG PO TABS: 1000.0000 mg/m2 | ORAL_TABLET | Freq: Two times a day (BID) | ORAL | 0 refills | Status: DC

## 2021-02-13 NOTE — Progress Notes (Signed)
START OFF PATHWAY REGIMEN - Other   [id number]:CapeOx + Bevacizumab (Capecitabine PO BID D1-14 + Oxaliplatin IV D1 + Bevacizumab IV D1) q21 Days:   A cycle is every 21 days:     Capecitabine      Bevacizumab-xxxx      Oxaliplatin   **Always confirm dose/schedule in your pharmacy ordering system**  Patient Characteristics: Intent of Therapy: Non-Curative / Palliative Intent, Discussed with Patient

## 2021-02-13 NOTE — Telephone Encounter (Signed)
Scheduled appointments per 3/3 los. Spoke to patient who is aware of appointments date and times. Gave patient calendar print out.

## 2021-02-14 DIAGNOSIS — Z933 Colostomy status: Secondary | ICD-10-CM | POA: Diagnosis not present

## 2021-02-15 DIAGNOSIS — Z933 Colostomy status: Secondary | ICD-10-CM | POA: Diagnosis not present

## 2021-02-16 DIAGNOSIS — Z933 Colostomy status: Secondary | ICD-10-CM | POA: Diagnosis not present

## 2021-02-17 DIAGNOSIS — E43 Unspecified severe protein-calorie malnutrition: Secondary | ICD-10-CM | POA: Diagnosis not present

## 2021-02-17 DIAGNOSIS — J90 Pleural effusion, not elsewhere classified: Secondary | ICD-10-CM | POA: Diagnosis not present

## 2021-02-17 DIAGNOSIS — Z933 Colostomy status: Secondary | ICD-10-CM | POA: Diagnosis not present

## 2021-02-17 DIAGNOSIS — I82621 Acute embolism and thrombosis of deep veins of right upper extremity: Secondary | ICD-10-CM | POA: Diagnosis not present

## 2021-02-17 DIAGNOSIS — J9600 Acute respiratory failure, unspecified whether with hypoxia or hypercapnia: Secondary | ICD-10-CM | POA: Diagnosis not present

## 2021-02-17 DIAGNOSIS — M6281 Muscle weakness (generalized): Secondary | ICD-10-CM | POA: Diagnosis not present

## 2021-02-17 DIAGNOSIS — J449 Chronic obstructive pulmonary disease, unspecified: Secondary | ICD-10-CM | POA: Diagnosis not present

## 2021-02-17 DIAGNOSIS — R1310 Dysphagia, unspecified: Secondary | ICD-10-CM | POA: Diagnosis not present

## 2021-02-17 DIAGNOSIS — C20 Malignant neoplasm of rectum: Secondary | ICD-10-CM | POA: Diagnosis not present

## 2021-02-17 DIAGNOSIS — R69 Illness, unspecified: Secondary | ICD-10-CM | POA: Diagnosis not present

## 2021-02-17 NOTE — Progress Notes (Signed)
Pharmacist Chemotherapy Monitoring - Initial Assessment    Anticipated start date: 02/24/21   Regimen:  . Are orders appropriate based on the patient's diagnosis, regimen, and cycle? Yes . Does the plan date match the patient's scheduled date? Yes . Is the sequencing of drugs appropriate? Yes . Are the premedications appropriate for the patient's regimen? Yes . Prior Authorization for treatment is: Not Started o If applicable, is the correct biosimilar selected based on the patient's insurance? Will f/u  Organ Function and Labs: Marland Kitchen Are dose adjustments needed based on the patient's renal function, hepatic function, or hematologic function? No . Are appropriate labs ordered prior to the start of patient's treatment? Yes . Other organ system assessment, if indicated: N/A . The following baseline labs, if indicated, have been ordered: bevacizumab: urine protein  Dose Assessment: . Are the drug doses appropriate? Yes . Are the following correct: o Drug concentrations Yes o IV fluid compatible with drug Yes o Administration routes Yes o Timing of therapy Yes . If applicable, does the patient have documented access for treatment and/or plans for port-a-cath placement? yes . If applicable, have lifetime cumulative doses been properly documented and assessed? not applicable Lifetime Dose Tracking  No doses have been documented on this patient for the following tracked chemicals: Doxorubicin, Epirubicin, Idarubicin, Daunorubicin, Mitoxantrone, Bleomycin, Oxaliplatin, Carboplatin, Liposomal Doxorubicin  o   Toxicity Monitoring/Prevention: . The patient has the following take home antiemetics prescribed: N/A . The patient has the following take home medications prescribed: N/A . Medication allergies and previous infusion related reactions, if applicable, have been reviewed and addressed. Yes . The patient's current medication list has been assessed for drug-drug interactions with their  chemotherapy regimen. no significant drug-drug interactions were identified on review.  Order Review: . Are the treatment plan orders signed? Yes . Is the patient scheduled to see a provider prior to their treatment? Yes  I verify that I have reviewed each item in the above checklist and answered each question accordingly.  Tracy [name], , BCPS, BCOP 02/17/2021  11:17 AM

## 2021-02-18 DIAGNOSIS — Z933 Colostomy status: Secondary | ICD-10-CM | POA: Diagnosis not present

## 2021-02-19 DIAGNOSIS — Z933 Colostomy status: Secondary | ICD-10-CM | POA: Diagnosis not present

## 2021-02-20 DIAGNOSIS — Z933 Colostomy status: Secondary | ICD-10-CM | POA: Diagnosis not present

## 2021-02-21 DIAGNOSIS — Z933 Colostomy status: Secondary | ICD-10-CM | POA: Diagnosis not present

## 2021-02-22 DIAGNOSIS — Z933 Colostomy status: Secondary | ICD-10-CM | POA: Diagnosis not present

## 2021-02-23 DIAGNOSIS — Z933 Colostomy status: Secondary | ICD-10-CM | POA: Diagnosis not present

## 2021-02-23 NOTE — Addendum Note (Signed)
Addended by: [name] on: 02/23/2021 02:49 PM   Modules accepted: Orders

## 2021-02-23 NOTE — Progress Notes (Signed)
[facility]   [phone] [phone]   Clinic Follow up Note   Patient Care Team: Kuneff, Renee A, DO as PCP - General (Family Medicine) Anderson, Cheryl L, RN as Oncology Nurse Navigator Feng, Yan, MD as Consulting Physician (Hematology) Icard, Bradley L, DO as Consulting Physician (Pulmonary Disease) [facility] Martin, Matthew, MD as Consulting Physician (General Surgery) Thomas, Alicia, MD as Consulting Physician (General Surgery) [name], Steven P, MD as Consulting Physician (Gastroenterology) 02/24/2021  CHIEF COMPLAINT: Follow up rectal cancer    SUMMARY OF ONCOLOGIC HISTORY: Oncology History Overview Note  Cancer Staging Rectal cancer (HCC) Staging form: Colon and Rectum, AJCC 8th Edition - Pathologic stage from 03/03/2020: Stage IIIB (pT4a, pN1b, cM0) - Signed by Feng, Yan, MD on 01/28/2021 Stage prefix: Initial diagnosis Histologic grading system: 4 grade system Histologic grade (G): G2 Residual tumor (R): R0 - None    Rectal cancer (HCC)  03/03/2020 - 04/05/2020 Hospital Admission   She was admitted to ED on 03/03/20 for abdominal pain and nausea. She had been having diarrhea for several weeks. During hospital stay she developed acute respiratory failure, AKI, RUE DVT from PICC line. Work up showed bowel perforation, small left liver mass and thickening of jejunum. She underwent emergent surgery on 03/03/20 for resection and colostomy placement. Her path showed invasive cancer, metastatic to 3/15 LNs. She had a NGT in placed but this was removed on POD 3. Post op her stoma became necrotic and septic. She had another bowel surgery on 03/12/20, which was NED with necrotic tissue.    03/03/2020 Imaging   CT AP 03/03/20  IMPRESSION: 1. Free intraperitoneal air consistent perforation. Most likely site of perforation is in the distal splenic flexure/proximal descending colon where there is a collection of air and gas  measuring 6 centimeters. No obvious soft tissue mass identified in this region. At this site, there is abrupt transition of dilated, stool-filled colon to completely decompressed proximal descending colon. 2. Thickened, inflamed loops of jejunum are identified within the pelvis and are likely reactive. 3. Small hiatal hernia. 4. Benign-appearing 1.6 centimeter mass the LEFT hepatic lobe. Recommend comparison with prior studies if available. 5.  Emphysema (ICD10-J43.9). 6.  Aortic Atherosclerosis (ICD10-I70.0). 7. Bilateral renal scarring.   03/03/2020 Surgery   low anterior resection end colostomy by Dr Thomas    03/03/2020 Initial Biopsy   FINAL MICROSCOPIC DIAGNOSIS: 03/03/20 A. RECTOSIGMOID COLON, LOW ANTERIOR RESECTION:  - Invasive colonic adenocarcinoma, 3.5 cm.  - Tumor invades the visceral peritoneum.  - Margins of resection are not involved.  - Metastatic carcinoma in (3) of (15) lymph nodes.  - See oncology table.   B. ADDITIONAL SIGMOID COLON, RESECTION:  - Colonic tissue, negative for carcinoma.  ADDENDUM:  Mismatch Repair Protein (IHC)   SUMMARY INTERPRETATION: NORMAL  There is preserved expression of the major MMR proteins. There is a very  low probability that microsatellite instability (MSI) is present.  However, certain clinically significant MMR protein mutations may result  in preservation of nuclear expression. It is recommended that the  preservation of protein expression be correlated with molecular based  MSI testing.   IHC EXPRESSION RESULTS  TEST           RESULT  MLH1:          Preserved nuclear expression  MSH2:          Preserved nuclear expression  MSH6:          Preserved nuclear expression    PMS2:          Preserved nuclear expression   03/03/2020 Cancer Staging   Staging form: Colon and Rectum, AJCC 8th Edition - Pathologic stage from 03/03/2020: Stage IIIB (pT4a, pN1b, cM0) - Signed by Feng, Yan, MD on 01/28/2021 Stage prefix: Initial  diagnosis Histologic grading system: 4 grade system Histologic grade (G): G2 Residual tumor (R): R0 - None   03/08/2020 Imaging   CT AP 03/08/20 IMPRESSION: 1. Interval midline laparotomy with distal colon resection and diverting left lower quadrant colostomy. 2. Diffuse small bowel dilatation with gas fluid levels most consistent with postoperative ileus. 3. Trace ascites within the abdomen and pelvis. No fluid collection or abscess at this time. Surgical drain within the lower pelvis. 4. Indeterminate 1.4 cm subcapsular liver hypodensity. In light of newly diagnosed rectal cancer, metastatic disease cannot be excluded. PET CT may be useful for further evaluation. 5. Interval development of trace bilateral pleural effusions and diffuse body wall edema.   03/12/2020 Surgery   EXPLORATORY LAPAROTOMY WITH BOWEL RESECTION AND COLOSTOMY by Dr Martin 03/12/20  FINAL MICROSCOPIC DIAGNOSIS: 03/12/20  A. COLON, SPLENIC FLEXURE, RESECTION:  - Segment of colon (37 cm) with perforation and associated inflammation  - Multiple mucosal ulcers with necrotizing inflammation  - No evidence of malignancy    03/12/2020 Imaging   CT AP WO contrast 03/12/20  IMPRESSION: 1. Exam is limited by lack of intravenous contrast, motion, and artifact from the patient's arms adjacent to the torso. Since 03/08/2020, there has been development of relatively large pockets of intraperitoneal free air. While intraperitoneal gas would not be unexpected on postoperative day 9, this gas is new since an intervening study of 03/08/2020 and given the relatively large volume certainly raises concern for bowel perforation. No source for the intraperitoneal free air is evident on this study. There is a surgical drain in the pelvis, but there is no free gas around the drain itself to suggest that it represents the source. 2. New circumferential wall thickening in the splenic flexure, descending colon and sigmoid colon  leading into the end colostomy. Infection/inflammation would be a consideration. Ischemia cannot be excluded. 3. Relatively small volume intraperitoneal free fluid. High attenuation small fluid collections in the left upper abdomen may reflect hemorrhage, infection or residua from prior perforation. 4. Interval progression of diffuse body wall edema. 5. Residual contrast material in the renal parenchyma from prior imaging, compatible with renal dysfunction.    03/19/2020 Imaging   CT CAP 03/19/20  IMPRESSION: 1. 5.5 x 3.2 cm fluid collection is noted along the greater curvature of the proximal stomach. 2. Surgical drain is again noted in the pelvis with tip in left lower quadrant. 3. Interval development of crescent-shaped fluid collection measuring 16.4 x 3.3 cm in the epigastric region and left upper quadrant of the abdomen which may extend into the left lower quadrant. Potentially this may represent abscess or developing abscess. Multiple other smaller fluid collections are noted which may represent small abscesses. 4. 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 5. Mild amount of free fluid is noted in the posterior pelvis. 6. Moderate anasarca is noted. Aortic Atherosclerosis (ICD10-I70.0).   04/12/2020 Imaging   CT AP 04/12/20  IMPRESSION: 1. Fluid collections within the LEFT abdomen have significantly decreased compared to previous CT exams, now nearly completely resolved. 2. Percutaneous drainage catheter with tip coiled posterior to the LEFT kidney, stable positioning compared to the previous study. The more anterior catheter has been removed. 3. Small amount   of free fluid persists within the abdomen and pelvis. 4. Trace bibasilar pleural effusions. 5. Anasarca. 6. While reviewing today's study, comparing with a chest/abdomen/pelvis CT from earlier same month, there is question of thrombus in the LEFT internal jugular vein. Recommend ultrasound of the LEFT IJ to  exclude DVT. This recommendation discussed with patient's hospitalist, Dr. Brown, on 04/12/2020 at 4:20 p.m.   Emphysema (ICD10-J43.9).   04/22/2020 Imaging   CT AP 04/22/20  IMPRESSION: 1. No recurrent intra-abdominal abscess status post interval removal of left retroperitoneal percutaneous drain. Stable small amount of free pelvic fluid. 2. Enlarging dependent bilateral pleural effusions, now moderate in volume. Associated atelectasis at both lung bases. 3. Progressive anasarca with generalized edema throughout the subcutaneous fat. 4. Stable small subcapsular fluid collection along the anterior aspect of the left hepatic lobe. 5. Aortic Atherosclerosis (ICD10-I70.0).   05/23/2020 Initial Diagnosis   Rectal cancer (HCC)   01/14/2021 Imaging   CT CAP IMPRESSION: -5.0 cm soft tissue mass along the posterior aspect of the cecum at the base of the appendix, favored to reflect a peritoneal/serosal implant, corresponding to the patient's known adenocarcinoma. This is new from the prior. -Suspected additional pelvic implants along the uterus and right adnexa, poorly visualized, new/progressive from the prior. -Additional pericapsular lesion along the lateral spleen is mildly progressive, suspicious for additional peritoneal implant. -No evidence of metastatic disease in the chest.   01/27/2021 PET scan   IMPRESSION:  1. Extensive hypermetabolic peritoneal metastasis, as detailed  above.  2. No evidence of hypermetabolic supradiaphragmatic disease.  3. Diffuse low-level thyroid hypermetabolism can be seen in the  setting of thyroiditis. Consider correlation with thyroid function  labs.  4. Aortic atherosclerosis (ICD10-I70.0) and emphysema (ICD10-J43.9).    02/24/2021 -  Chemotherapy      Patient is on Antibody Plan: COLORECTAL BEVACIZUMAB Q14D    Malignant neoplasm of appendix (HCC)  01/06/2021 Procedure   (surveillance colonoscopy for h/o rectal cancer) Impression:  -  Preparation of the colon was fair, lavage performed with mostly adequate views. - Polypoid lesion obliterating appendiceal orifice. Biopsied to evaluate for malignancy. - The examined portion of the ileum was normal. - Two large polyps in the cecum, not removed today pending path at appendiceal orifice, bowel prep. If removed endoscopically in the future would favor EMR to be done at the hospital. - One 5 mm polyp at the ileocecal valve, removed with a cold snare. Resected and retrieved. - One 3 mm polyp in the transverse colon, removed with a cold snare. Resected and retrieved. - Parastomal hernia making initial entry to the distal colon challenging. - The examination was otherwise normal.   01/06/2021 Initial Biopsy   Diagnosis 1. Colon, biopsy, appendiceal oraface - ADENOCARCINOMA. 2. Colon, polyp(s), ileocecal valve, transverse, x2 - TUBULAR ADENOMA, NEGATIVE FOR HIGH GRADE DYSPLASIA (X1). - COLONIC MUCOSA WITH UNDERLYING LYMPHOID AGGREGATE, NEGATIVE FOR DYSPLASIA (X1).  MMR normal   01/06/2021 Initial Diagnosis   Malignant neoplasm of appendix (HCC)   01/14/2021 Imaging   CT CAP IMPRESSION: -5.0 cm soft tissue mass along the posterior aspect of the cecum at the base of the appendix, favored to reflect a peritoneal/serosal implant, corresponding to the patient's known adenocarcinoma. This is new from the prior. -Suspected additional pelvic implants along the uterus and right adnexa, poorly visualized, new/progressive from the prior. -Additional pericapsular lesion along the lateral spleen is mildly progressive, suspicious for additional peritoneal implant. -No evidence of metastatic disease in the chest.     CURRENT THERAPY:    First line chemo Xeloda 1500 mg twice daily for day 1-14, every 21 days, started on 02/01/2021, change to day 1-7 every 14 days and add bevacizumab from second cycle 3/14  INTERVAL HISTORY: Ms. Hession returns for follow up and treatment as scheduled. She  was last seen 02/13/21 and completed first cycle of Xeloda.  On her week off she was able to sleep better at night, therefore had more daytime energy and was more active.  She is eating and drinking.  Denies mucositis, diarrhea, hand-foot syndrome, nausea/vomiting.  Her baseline dyspnea is stable, no new cough, chest pain, fever, or chills.  She has intermittent left lower quadrant pain for a week or so, managed with Tylenol as needed.  She started cycle 2 Xeloda 1 week on/1 week off this morning.    MEDICAL HISTORY:  Past Medical History:  Diagnosis Date  . Acute deep vein thrombosis (DVT) of brachial vein of right upper extremity (HCC)   . Acute on chronic respiratory failure with hypoxia (HCC)   . Asthma   . Cataract   . CHICKENPOX, HX OF 01/06/2011   Qualifier: Diagnosis of  By: Blyth MD, Stacey    . COPD (chronic obstructive pulmonary disease) (HCC) 2011   FeV1 31% predicted FeV1/FVX 47 %  . Essential hypertension 05/23/2020  . Hemorrhoid   . Open right radial fracture 2020  . Osteopenia   . Perforated sigmoid colon (HCC) 03/03/2020  . Pleural effusion   . Pneumonia 2021  . Postoperative intra-abdominal abscess 2021  . Rectal cancer (HCC)   . Seasonal allergies    takes Claritin daily prn  . Shock circulatory (HCC)   . Tracheostomy status (HCC)   . Vitamin D deficiency    takes Vit d every 14 days    SURGICAL HISTORY: Past Surgical History:  Procedure Laterality Date  . AUGMENTATION MAMMAPLASTY     saline  . EXAMINATION UNDER ANESTHESIA  10/14/2012   Procedure: EXAM UNDER ANESTHESIA;  Surgeon: Eric M Wilson, MD,FACS;  Location: [facility] OR;  Service: General;  Laterality: N/A;  rectal exam under anesthesia excisional hemorrhoidectomy hemorrhoidal banding x two  . EXAMINATION UNDER ANESTHESIA  02/03/2013   excision hemorrhoidal tissue  . FOOT SURGERY     left bunionectomy  . HEMORRHOIDECTOMY WITH HEMORRHOID BANDING  10/14/2012   Procedure: HEMORRHOIDECTOMY WITH HEMORRHOID BANDING;   Surgeon: Eric M Wilson, MD,FACS;  Location: [facility] OR;  Service: General;  Laterality: N/A;  . IR IMAGING GUIDED PORT INSERTION  02/03/2021  . IR THORACENTESIS ASP PLEURAL SPACE W/IMG GUIDE  04/23/2020  . LAPAROTOMY N/A 03/03/2020   Procedure: low anterior resection end colostomy;  Surgeon: Thomas, Alicia, MD;  Location: [facility] ORS;  Service: General;  Laterality: N/A;  . LAPAROTOMY N/A 03/12/2020   Procedure: EXPLORATORY LAPAROTOMY WITH BOWEL RESECTION AND COLOSTOMY;  Surgeon: Martin, Matthew, MD;  Location: [facility];  Service: General;  Laterality: N/A;  . OPEN REDUCTION INTERNAL FIXATION (ORIF) DISTAL RADIAL FRACTURE Right 11/01/2018   Procedure: OPEN REDUCTION INTERNAL FIXATION (ORIF) DISTAL RADIAL FRACTURE;  Surgeon: Kuzma, Kevin, MD;  Location: [facility];  Service: Orthopedics;  Laterality: Right;  . TONSILLECTOMY  1960   recurrent otitis media  . US ECHOCARDIOGRAPHY  02/2020    poor windows, normal LV function, severely dilated RV with moderately reduced function, RV volume and pressure overload, mildly dilated RA    I have reviewed the social history and family history with the patient and they are unchanged from previous note.  ALLERGIES:    is allergic to amlodipine and codeine.  MEDICATIONS:  Current Outpatient Medications  Medication Sig Dispense Refill  . ondansetron (ZOFRAN) 8 MG tablet Take 1 tablet (8 mg total) by mouth every 8 (eight) hours as needed for nausea or vomiting. 20 tablet 2  . albuterol (PROVENTIL) (2.5 MG/3ML) 0.083% nebulizer solution Take 3 mLs (2.5 mg total) by nebulization every 4 (four) hours as needed for wheezing or shortness of breath. DX: J44.9 360 mL 5  . albuterol (VENTOLIN HFA) 108 (90 Base) MCG/ACT inhaler Inhale 2 puffs into the lungs every 6 (six) hours as needed for wheezing or shortness of breath. 8 g 5  . ALPRAZolam (XANAX) 0.25 MG tablet Take 1 tablet (0.25 mg total) by mouth at bedtime as needed for anxiety. 30 tablet 0  . capecitabine  (XELODA) 500 MG tablet Take 3 tablets (1,500 mg total) by mouth 2 (two) times daily after a meal. Take for 7 days on, 7 days off, repeat every 14 days. 84 tablet 0  . diphenhydrAMINE (BENADRYL) 25 mg capsule Take 25 mg by mouth every 6 (six) hours as needed.    . docusate sodium (COLACE) 50 MG capsule Take 50 mg by mouth daily as needed for mild constipation.    . fluticasone (FLONASE) 50 MCG/ACT nasal spray Place 2 sprays into both nostrils daily. 16 mL 5  . Fluticasone-Umeclidin-Vilant (TRELEGY ELLIPTA) 100-62.5-25 MCG/INH AEPB Inhale 1 puff into the lungs daily. 60 each 5  . lidocaine-prilocaine (EMLA) cream Apply 1 application topically as needed. 30 g 0   No current facility-administered medications for this visit.   Facility-Administered Medications Ordered in Other Visits  Medication Dose Route Frequency Provider Last Rate Last Admin  . bevacizumab-bvzr (ZIRABEV) 300 mg in sodium chloride 0.9 % 100 mL chemo infusion  5 mg/kg (Treatment Plan Recorded) Intravenous Once [name], MD      . heparin lock flush 100 unit/mL  500 Units Intracatheter Once PRN [name], MD      . sodium chloride flush (NS) 0.9 % injection 10 mL  10 mL Intracatheter PRN [name], MD        PHYSICAL EXAMINATION: ECOG PERFORMANCE STATUS: 1 - Symptomatic but completely ambulatory  Vitals:   02/24/21 0932  BP: (!) 152/71  Pulse: 89  Resp: 20  Temp: (!) 97 F (36.1 C)  SpO2: 92%   Filed Weights   02/24/21 0932  Weight: 135 lb 11.2 oz (61.6 kg)    GENERAL:alert, no distress and comfortable SKIN: No rash.  Palms without erythema EYES: sclera clear LUNGS:  normal breathing effort HEART: regular rate & rhythm.  Lower extremities obscured by knee-high boots ABDOMEN:abdomen soft, non-tender and normal bowel sounds.  No palpable soft tissue nodules.  Left lower quadrant colostomy noted with brown stool in collection bag NEURO: alert & oriented x 3 with fluent speech, no focal motor/sensory deficits PAC  without erythema  LABORATORY DATA:  I have reviewed the data as listed CBC Latest Ref Rng & Units 02/24/2021 02/13/2021 02/03/2021  WBC 4.0 - 10.5 K/uL 7.2 8.5 8.8  Hemoglobin 12.0 - 15.0 g/dL 12.7 14.0 14.7  Hematocrit 36.0 - 46.0 % 38.4 43.0 44.8  Platelets 150 - 400 K/uL PLATELET CLUMPS NOTED ON SMEAR, UNABLE TO ESTIMATE 381 392     CMP Latest Ref Rng & Units 02/24/2021 02/13/2021 01/29/2021  Glucose 70 - 99 mg/dL 92 102(H) 89  BUN 8 - 23 mg/dL 25(H) 24(H) 17  Creatinine 0.44 - 1.00 mg/dL 0.91 0.98 1.14(H)  Sodium 135 - 145 mmol/L 137 141 142  Potassium 3.5 - 5.1 mmol/L 4.2 4.7 4.7  Chloride 98 - 111 mmol/L 105 104 106  CO2 22 - 32 mmol/L _0 Calcium 8.9 - 10.3 mg/dL 9.2 9.7 9.6  Total Protein 6.5 - 8.1 g/dL 6.8 7.8 7.7  Total Bilirubin 0.3 - 1.2 mg/dL 0.5 0.4 0.4  Alkaline Phos 38 - 126 U/L 81 103 107  AST 15 - 41 U/L 25 33 17  ALT 0 - 44 U/L _1 RADIOGRAPHIC STUDIES: I have personally reviewed the radiological images as listed and agreed with the findings in the report. No results found.   ASSESSMENT & PLAN: 70 yo female   1. [name], MMR normal, peritoneal metastasis in 01/2021 -diagnosed 02/2020, due to perforation underwent emergent surgery. Path showed invasive adenocarcinoma with perforation, +3/15 LNs, and clear margins. Initial CT scan negative for distant metastasis.  -due to bowel perforation and + LNs she has very high risk for recurrence, unfortunately she was not a candidate for adjuvant chemo due to very slow recovery from surgery -Guardant Reveal ctDNA were all negative x3 (06/06/20, 07/12/20, and 09/10/20).  -surveillance CT 08/27/20 showed a small density adjacent to the spleen that had decreased, no other evidence of recurrent or metastatic disease  -first surveillance colonoscopy by Dr. Havery Moros 01/06/21 showed new adenocarcinoma at appendiceal orifice and other tubular adenomas throughout the colon.  Biopsy  confirmed adenocarcinoma -01/27/2021 PET showed tumor in appendiceal orifice is likely peritoneal metastasis growing into the cecum.  Her peritoneal implants are mainly in the pelvis, difficult to biopsy, Dr. Burr Medico did not feel strongly that she needed a biopsy to confirm -Dr. Burr Medico recommended first-line Capox, she began single agent Xeloda with cycle 1 on 2/19.  Due to low performance status with cycle 2 was changed to 1 week on/1 week off -Start cycle 2 today 02/24/2021, add bevacizumab  3. H/o provoked RUE DVT 04/04/20 s/p 3 months AC (Eliquis)  4. Comorbidities: Asthma, COPD, anxiety -on nebulizer and trelegy, has not needed rescue inhaler recently  -f/up pulmonology and PCP     Disposition: Ms. Sigler appears stable.  She completed 1 cycle of single agent Xeloda.  She tolerated well except mainly fatigue.  We reviewed symptom management.  She was able to recover well on her week off and function well.  She has intermittent left lower quadrant pain.  We reviewed her PET scan.  Pain is likely from peritoneal implants in the left abdomen and pelvis.  Currently managed with Tylenol.  Will monitor closely on therapy.  Labs reviewed, adequate to proceed with cycle 2 Xeloda and add bevacizumab today.  Her CEA normalized after cycle 1, suggesting a clinical response to treatment.  She will proceed with cycle 2 Xeloda changed to 1500 mg twice daily for 1 week on and 1 week off, and add bevacizumab today.  We again reviewed potential side effects, symptom management, and she is ready to proceed.  Follow-up in 2 weeks with next cycle.  The plan is to add oxaliplatin in the future if she tolerates Xeloda and Avastin well.  All questions were answered. The patient knows to call the clinic with any problems, questions or concerns. No barriers to learning were detected.  Total encounter time is 30 minutes.     Alla Feeling, NP 02/24/21

## 2021-02-24 ENCOUNTER — Other Ambulatory Visit: Payer: Self-pay | Admitting: *Deleted

## 2021-02-24 ENCOUNTER — Inpatient Hospital Stay: Payer: Medicare HMO | Admitting: Nurse Practitioner

## 2021-02-24 ENCOUNTER — Encounter: Payer: Self-pay | Admitting: Nurse Practitioner

## 2021-02-24 ENCOUNTER — Other Ambulatory Visit: Payer: Self-pay

## 2021-02-24 ENCOUNTER — Inpatient Hospital Stay: Payer: Medicare HMO

## 2021-02-24 VITALS — BP 110/48

## 2021-02-24 VITALS — BP 152/71 | HR 89 | Temp 97.0°F | Resp 20 | Ht 62.0 in | Wt 135.7 lb

## 2021-02-24 DIAGNOSIS — Z5112 Encounter for antineoplastic immunotherapy: Secondary | ICD-10-CM | POA: Diagnosis not present

## 2021-02-24 DIAGNOSIS — Z95828 Presence of other vascular implants and grafts: Secondary | ICD-10-CM

## 2021-02-24 DIAGNOSIS — Z933 Colostomy status: Secondary | ICD-10-CM | POA: Diagnosis not present

## 2021-02-24 DIAGNOSIS — I10 Essential (primary) hypertension: Secondary | ICD-10-CM | POA: Diagnosis not present

## 2021-02-24 DIAGNOSIS — C20 Malignant neoplasm of rectum: Secondary | ICD-10-CM | POA: Diagnosis not present

## 2021-02-24 DIAGNOSIS — C786 Secondary malignant neoplasm of retroperitoneum and peritoneum: Secondary | ICD-10-CM | POA: Diagnosis not present

## 2021-02-24 DIAGNOSIS — C19 Malignant neoplasm of rectosigmoid junction: Secondary | ICD-10-CM | POA: Diagnosis not present

## 2021-02-24 DIAGNOSIS — R69 Illness, unspecified: Secondary | ICD-10-CM | POA: Diagnosis not present

## 2021-02-24 DIAGNOSIS — M8580 Other specified disorders of bone density and structure, unspecified site: Secondary | ICD-10-CM | POA: Diagnosis not present

## 2021-02-24 DIAGNOSIS — Z86718 Personal history of other venous thrombosis and embolism: Secondary | ICD-10-CM | POA: Diagnosis not present

## 2021-02-24 DIAGNOSIS — J45909 Unspecified asthma, uncomplicated: Secondary | ICD-10-CM | POA: Diagnosis not present

## 2021-02-24 DIAGNOSIS — C181 Malignant neoplasm of appendix: Secondary | ICD-10-CM | POA: Diagnosis not present

## 2021-02-24 LAB — CMP (CANCER CENTER ONLY)
ALT: 17 U/L (ref 0–44)
AST: 25 U/L (ref 15–41)
Albumin: 4.2 g/dL (ref 3.5–5.0)
Alkaline Phosphatase: 81 U/L (ref 38–126)
Anion gap: 8 (ref 5–15)
BUN: 25 mg/dL — ABNORMAL HIGH (ref 8–23)
CO2: 24 mmol/L (ref 22–32)
Calcium: 9.2 mg/dL (ref 8.9–10.3)
Chloride: 105 mmol/L (ref 98–111)
Creatinine: 0.91 mg/dL (ref 0.44–1.00)
GFR, Estimated: >60 mL/min (ref >60)
Glucose, Bld: 92 mg/dL (ref 70–99)
Potassium: 4.2 mmol/L (ref 3.5–5.1)
Sodium: 137 mmol/L (ref 135–145)
Total Bilirubin: 0.5 mg/dL (ref 0.3–1.2)
Total Protein: 6.8 g/dL (ref 6.5–8.1)

## 2021-02-24 LAB — CEA (IN HOUSE-CHCC): CEA (CHCC-In House): 4.09 ng/mL (ref 0.00–5.00)

## 2021-02-24 LAB — CBC WITH DIFFERENTIAL (CANCER CENTER ONLY)
Abs Immature Granulocytes: 0.02 10*3/uL (ref 0.00–0.07)
Basophils Absolute: 0.1 10*3/uL (ref 0.0–0.1)
Basophils Relative: 1 %
Eosinophils Absolute: 0.1 10*3/uL (ref 0.0–0.5)
Eosinophils Relative: 1 %
HCT: 38.4 % (ref 36.0–46.0)
Hemoglobin: 12.7 g/dL (ref 12.0–15.0)
Immature Granulocytes: 0 %
Lymphocytes Relative: 26 %
Lymphs Abs: 1.8 10*3/uL (ref 0.7–4.0)
MCH: 32.9 pg (ref 26.0–34.0)
MCHC: 33.1 g/dL (ref 30.0–36.0)
MCV: 99.5 fL (ref 80.0–100.0)
Monocytes Absolute: 0.8 10*3/uL (ref 0.1–1.0)
Monocytes Relative: 11 %
Neutro Abs: 4.5 10*3/uL (ref 1.7–7.7)
Neutrophils Relative %: 61 %
Platelet Count: UNDETERMINED 10*3/uL (ref 150–400)
RBC: 3.86 MIL/uL — ABNORMAL LOW (ref 3.87–5.11)
RDW: 14.1 % (ref 11.5–15.5)
WBC Count: 7.2 10*3/uL (ref 4.0–10.5)
nRBC: 0 % (ref 0.0–0.2)

## 2021-02-24 LAB — FERRITIN: Ferritin: 46 ng/mL (ref 11–307)

## 2021-02-24 LAB — IRON AND TIBC
Iron: 149 ug/dL — ABNORMAL HIGH (ref 41–142)
Saturation Ratios: 37 % (ref 21–57)
TIBC: 400 ug/dL (ref 236–444)
UIBC: 251 ug/dL (ref 120–384)

## 2021-02-24 LAB — PLATELET COUNT (CANCER CENTER ONLY): Platelet Count: 304 10*3/uL (ref 150–400)

## 2021-02-24 LAB — TOTAL PROTEIN, URINE DIPSTICK: Protein, ur: NEGATIVE mg/dL

## 2021-02-24 MED ORDER — ONDANSETRON HCL 8 MG PO TABS: 8.0000 mg | ORAL_TABLET | Freq: Three times a day (TID) | ORAL | 2 refills | Status: DC | PRN

## 2021-02-24 MED ORDER — SODIUM CHLORIDE 0.9 % IV SOLN
5.0000 mg/kg | Freq: Once | INTRAVENOUS | Status: AC
Start: 2021-02-24 — End: 2021-02-24
  Administered 2021-02-24: 300 mg via INTRAVENOUS
  Filled 2021-02-24: qty 12

## 2021-02-24 MED ORDER — HEPARIN SOD (PORK) LOCK FLUSH 100 UNIT/ML IV SOLN
500.0000 [IU] | Freq: Once | INTRAVENOUS | Status: AC | PRN
  Administered 2021-02-24: 500 [IU]
  Filled 2021-02-24: qty 5

## 2021-02-24 MED ORDER — SODIUM CHLORIDE 0.9% FLUSH
10.0000 mL | INTRAVENOUS | Status: DC | PRN
  Administered 2021-02-24: 10 mL
  Filled 2021-02-24: qty 10

## 2021-02-24 MED ORDER — SODIUM CHLORIDE 0.9% FLUSH
10.0000 mL | INTRAVENOUS | Status: DC | PRN
  Administered 2021-02-24: 10 mL via INTRAVENOUS
  Filled 2021-02-24: qty 10

## 2021-02-24 MED ORDER — OCTREOTIDE ACETATE 20 MG IM KIT
PACK | INTRAMUSCULAR | Status: AC
  Filled 2021-02-24: qty 1

## 2021-02-24 MED ORDER — SODIUM CHLORIDE 0.9 % IV SOLN
Freq: Once | INTRAVENOUS | Status: AC
  Filled 2021-02-24: qty 250

## 2021-02-24 NOTE — Patient Instructions (Signed)
[facility] Discharge Instructions for Patients Receiving Chemotherapy  Today you received the following chemotherapy agents Bevacizumab  To help prevent nausea and vomiting after your treatment, we encourage you to take your nausea medication as directed.    If you develop nausea and vomiting that is not controlled by your nausea medication, call the clinic.   BELOW ARE SYMPTOMS THAT SHOULD BE REPORTED IMMEDIATELY:  *FEVER GREATER THAN 100.5 F  *CHILLS WITH OR WITHOUT FEVER  NAUSEA AND VOMITING THAT IS NOT CONTROLLED WITH YOUR NAUSEA MEDICATION  *UNUSUAL SHORTNESS OF BREATH  *UNUSUAL BRUISING OR BLEEDING  TENDERNESS IN MOUTH AND THROAT WITH OR WITHOUT PRESENCE OF ULCERS  *URINARY PROBLEMS  *BOWEL PROBLEMS  UNUSUAL RASH Items with * indicate a potential emergency and should be followed up as soon as possible.  Feel free to call the clinic should you have any questions or concerns. The clinic phone number is [phone].  Please show the CHEMO ALERT CARD at check-in to the Emergency Department and triage nurse.  Bevacizumab injection What is this medicine? BEVACIZUMAB (be va SIZ yoo mab) is a monoclonal antibody. It is used to treat many types of cancer. This medicine may be used for other purposes; ask your health care provider or pharmacist if you have questions. COMMON BRAND NAME(S): Avastin, MVASI, Zirabev What should I tell my health care provider before I take this medicine? They need to know if you have any of these conditions:  diabetes  heart disease  high blood pressure  history of coughing up blood  prior anthracycline chemotherapy (e.g., doxorubicin, daunorubicin, epirubicin)  recent or ongoing radiation therapy  recent or planning to have surgery  stroke  an unusual or allergic reaction to bevacizumab, hamster proteins, mouse proteins, other medicines, foods, dyes, or preservatives  pregnant or trying to get  pregnant  breast-feeding How should I use this medicine? This medicine is for infusion into a vein. It is given by a health care professional in a hospital or clinic setting. Talk to your pediatrician regarding the use of this medicine in children. Special care may be needed. Overdosage: If you think you have taken too much of this medicine contact a poison control center or emergency room at once. NOTE: This medicine is only for you. Do not share this medicine with others. What if I miss a dose? It is important not to miss your dose. Call your doctor or health care professional if you are unable to keep an appointment. What may interact with this medicine? Interactions are not expected. This list may not describe all possible interactions. Give your health care provider a list of all the medicines, herbs, non-prescription drugs, or dietary supplements you use. Also tell them if you smoke, drink alcohol, or use illegal drugs. Some items may interact with your medicine. What should I watch for while using this medicine? Your condition will be monitored carefully while you are receiving this medicine. You will need important blood work and urine testing done while you are taking this medicine. This medicine may increase your risk to bruise or bleed. Call your doctor or health care professional if you notice any unusual bleeding. Before having surgery, talk to your health care provider to make sure it is ok. This drug can increase the risk of poor healing of your surgical site or wound. You will need to stop this drug for 28 days before surgery. After surgery, wait at least 28 days before restarting this drug. Make sure the   surgical site or wound is healed enough before restarting this drug. Talk to your health care provider if questions. Do not become pregnant while taking this medicine or for 6 months after stopping it. Women should inform their doctor if they wish to become pregnant or think they  might be pregnant. There is a potential for serious side effects to an unborn child. Talk to your health care professional or pharmacist for more information. Do not breast-feed an infant while taking this medicine and for 6 months after the last dose. This medicine has caused ovarian failure in some women. This medicine may interfere with the ability to have a child. You should talk to your doctor or health care professional if you are concerned about your fertility. What side effects may I notice from receiving this medicine? Side effects that you should report to your doctor or health care professional as soon as possible:  allergic reactions like skin rash, itching or hives, swelling of the face, lips, or tongue  chest pain or chest tightness  chills  coughing up blood  high fever  seizures  severe constipation  signs and symptoms of bleeding such as bloody or black, tarry stools; red or dark-brown urine; spitting up blood or brown material that looks like coffee grounds; red spots on the skin; unusual bruising or bleeding from the eye, gums, or nose  signs and symptoms of a blood clot such as breathing problems; chest pain; severe, sudden headache; pain, swelling, warmth in the leg  signs and symptoms of a stroke like changes in vision; confusion; trouble speaking or understanding; severe headaches; sudden numbness or weakness of the face, arm or leg; trouble walking; dizziness; loss of balance or coordination  stomach pain  sweating  swelling of legs or ankles  vomiting  weight gain Side effects that usually do not require medical attention (report to your doctor or health care professional if they continue or are bothersome):  back pain  changes in taste  decreased appetite  dry skin  nausea  tiredness This list may not describe all possible side effects. Call your doctor for medical advice about side effects. You may report side effects to FDA at  1-800-FDA-1088. Where should I keep my medicine? This drug is given in a hospital or clinic and will not be stored at home. NOTE: This sheet is a summary. It may not cover all possible information. If you have questions about this medicine, talk to your doctor, pharmacist, or health care provider.  2021 [employer] Standard (2019-09-27 10:50:46)

## 2021-02-24 NOTE — Patient Instructions (Signed)
Tracy [name], if you tolerate Xeloda and avastin well, Dr. Burr Medico plans to add Oxaliplatin in the next few weeks as an additional cancer therapy. Here is the information:  Oxaliplatin Injection What is this medicine? OXALIPLATIN (ox AL i PLA tin) is a chemotherapy drug. It targets fast dividing cells, like cancer cells, and causes these cells to die. This medicine is used to treat cancers of the colon and rectum, and many other cancers. This medicine may be used for other purposes; ask your health care provider or pharmacist if you have questions. COMMON BRAND NAME(S): Eloxatin What should I tell my health care provider before I take this medicine? They need to know if you have any of these conditions:  heart disease  history of irregular heartbeat  liver disease  low blood counts, like white cells, platelets, or red blood cells  lung or breathing disease, like asthma  take medicines that treat or prevent blood clots  tingling of the fingers or toes, or other nerve disorder  an unusual or allergic reaction to oxaliplatin, other chemotherapy, other medicines, foods, dyes, or preservatives  pregnant or trying to get pregnant  breast-feeding How should I use this medicine? This drug is given as an infusion into a vein. It is administered in a hospital or clinic by a specially trained health care professional. Talk to your pediatrician regarding the use of this medicine in children. Special care may be needed. Overdosage: If you think you have taken too much of this medicine contact a poison control center or emergency room at once. NOTE: This medicine is only for you. Do not share this medicine with others. What if I miss a dose? It is important not to miss a dose. Call your doctor or health care professional if you are unable to keep an appointment. What may interact with this medicine? Do not take this medicine with any of the following  medications:  cisapride  dronedarone  pimozide  thioridazine This medicine may also interact with the following medications:  aspirin and aspirin-like medicines  certain medicines that treat or prevent blood clots like warfarin, apixaban, dabigatran, and rivaroxaban  cisplatin  cyclosporine  diuretics  medicines for infection like acyclovir, adefovir, amphotericin B, bacitracin, cidofovir, foscarnet, ganciclovir, gentamicin, pentamidine, vancomycin  NSAIDs, medicines for pain and inflammation, like ibuprofen or naproxen  other medicines that prolong the QT interval (an abnormal heart rhythm)  pamidronate  zoledronic acid This list may not describe all possible interactions. Give your health care provider a list of all the medicines, herbs, non-prescription drugs, or dietary supplements you use. Also tell them if you smoke, drink alcohol, or use illegal drugs. Some items may interact with your medicine. What should I watch for while using this medicine? Your condition will be monitored carefully while you are receiving this medicine. You may need blood work done while you are taking this medicine. This medicine may make you feel generally unwell. This is not uncommon as chemotherapy can affect healthy cells as well as cancer cells. Report any side effects. Continue your course of treatment even though you feel ill unless your healthcare professional tells you to stop. This medicine can make you more sensitive to cold. Do not drink cold drinks or use ice. Cover exposed skin before coming in contact with cold temperatures or cold objects. When out in cold weather wear warm clothing and cover your mouth and nose to warm the air that goes into your lungs. Tell your doctor if you get sensitive  to the cold. Do not become pregnant while taking this medicine or for 9 months after stopping it. Women should inform their health care professional if they wish to become pregnant or think they  might be pregnant. Men should not father a child while taking this medicine and for 6 months after stopping it. There is potential for serious side effects to an unborn child. Talk to your health care professional for more information. Do not breast-feed a child while taking this medicine or for 3 months after stopping it. This medicine has caused ovarian failure in some women. This medicine may make it more difficult to get pregnant. Talk to your health care professional if you are concerned about your fertility. This medicine has caused decreased sperm counts in some men. This may make it more difficult to father a child. Talk to your health care professional if you are concerned about your fertility. This medicine may increase your risk of getting an infection. Call your health care professional for advice if you get a fever, chills, or sore throat, or other symptoms of a cold or flu. Do not treat yourself. Try to avoid being around people who are sick. Avoid taking medicines that contain aspirin, acetaminophen, ibuprofen, naproxen, or ketoprofen unless instructed by your health care professional. These medicines may hide a fever. Be careful brushing or flossing your teeth or using a toothpick because you may get an infection or bleed more easily. If you have any dental work done, tell your dentist you are receiving this medicine. What side effects may I notice from receiving this medicine? Side effects that you should report to your doctor or health care professional as soon as possible:  allergic reactions like skin rash, itching or hives, swelling of the face, lips, or tongue  breathing problems  cough  low blood counts - this medicine may decrease the number of white blood cells, red blood cells, and platelets. You may be at increased risk for infections and bleeding  nausea, vomiting  pain, redness, or irritation at site where injected  pain, tingling, numbness in the hands or  feet  signs and symptoms of bleeding such as bloody or black, tarry stools; red or dark brown urine; spitting up blood or brown material that looks like coffee grounds; red spots on the skin; unusual bruising or bleeding from the eyes, gums, or nose  signs and symptoms of a dangerous change in heartbeat or heart rhythm like chest pain; dizziness; fast, irregular heartbeat; palpitations; feeling faint or lightheaded; falls  signs and symptoms of infection like fever; chills; cough; sore throat; pain or trouble passing urine  signs and symptoms of liver injury like dark yellow or brown urine; general ill feeling or flu-like symptoms; light-colored stools; loss of appetite; nausea; right upper belly pain; unusually weak or tired; yellowing of the eyes or skin  signs and symptoms of low red blood cells or anemia such as unusually weak or tired; feeling faint or lightheaded; falls  signs and symptoms of muscle injury like dark urine; trouble passing urine or change in the amount of urine; unusually weak or tired; muscle pain; back pain Side effects that usually do not require medical attention (report to your doctor or health care professional if they continue or are bothersome):  changes in taste  diarrhea  gas  hair loss  loss of appetite  mouth sores This list may not describe all possible side effects. Call your doctor for medical advice about side effects. You may  report side effects to FDA at 1-800-FDA-1088. Where should I keep my medicine? This drug is given in a hospital or clinic and will not be stored at home. NOTE: This sheet is a summary. It may not cover all possible information. If you have questions about this medicine, talk to your doctor, pharmacist, or health care provider.  2021 [employer] Standard (2019-04-19 12:20:35)

## 2021-02-24 NOTE — Progress Notes (Signed)
OK to treat without platelet results per Dr Burr Medico

## 2021-02-24 NOTE — Progress Notes (Signed)
Platelets ordered  

## 2021-02-24 NOTE — Progress Notes (Incomplete)
[place]   [phone] [phone]   Clinic Follow up Note   Patient Care Team: [name], DO as PCP - General (Family Medicine) Jonnie Finner, RN as Oncology Nurse Navigator [name], MD as Consulting Physician (Hematology) Icard, Octavio Graves, DO as Consulting Physician (Pulmonary Disease) [facility] [name], MD as Consulting Physician (General Surgery) [name], MD as Consulting Physician (General Surgery) [name], [name], MD as Consulting Physician (Gastroenterology)  Date of Service:  02/24/2021  CHIEF COMPLAINT: ***  SUMMARY OF ONCOLOGIC HISTORY: Oncology History Overview Note  Cancer Staging Rectal cancer [facility]) Staging form: Colon and Rectum, AJCC 8th Edition - Pathologic stage from 03/03/2020: Stage IIIB (pT4a, pN1b, cM0) - Signed by [name], MD on 01/28/2021 Stage prefix: Initial diagnosis Histologic grading system: 4 grade system Histologic grade (G): G2 Residual tumor (R): R0 - None    Rectal cancer ([place])  03/03/2020 - 04/05/2020 Hospital Admission   She was admitted to ED on 03/03/20 for abdominal pain and nausea. She had been having diarrhea for several weeks. During hospital stay she developed acute respiratory failure, AKI, RUE DVT from PICC line. Work up showed bowel perforation, small left liver mass and thickening of jejunum. She underwent emergent surgery on 03/03/20 for resection and colostomy placement. Her path showed invasive cancer, metastatic to 3/15 LNs. She had a NGT in placed but this was removed on POD 3. Post op her stoma became necrotic and septic. She had another bowel surgery on 03/12/20, which was NED with necrotic tissue.    03/03/2020 Imaging   CT AP 03/03/20  IMPRESSION: 1. Free intraperitoneal air consistent perforation. Most likely site of perforation is in the distal splenic flexure/proximal descending colon where there is a collection of air and gas measuring  6 centimeters. No obvious soft tissue mass identified in this region. At this site, there is abrupt transition of dilated, stool-filled colon to completely decompressed proximal descending colon. 2. Thickened, inflamed loops of jejunum are identified within the pelvis and are likely reactive. 3. Small hiatal hernia. 4. Benign-appearing 1.6 centimeter mass the LEFT hepatic lobe. Recommend comparison with prior studies if available. 5.  Emphysema (ICD10-J43.9). 6.  Aortic Atherosclerosis (ICD10-I70.0). 7. Bilateral renal scarring.   03/03/2020 Surgery   low anterior resection end colostomy by Dr Marcello Moores    03/03/2020 Initial Biopsy   FINAL MICROSCOPIC DIAGNOSIS: 03/03/20 A. RECTOSIGMOID COLON, LOW ANTERIOR RESECTION:  - Invasive colonic adenocarcinoma, 3.5 cm.  - Tumor invades the visceral peritoneum.  - Margins of resection are not involved.  - Metastatic carcinoma in (3) of (15) lymph nodes.  - See oncology table.   B. ADDITIONAL SIGMOID COLON, RESECTION:  - Colonic tissue, negative for carcinoma.  ADDENDUM:  Mismatch Repair Protein (IHC)   SUMMARY INTERPRETATION: NORMAL  There is preserved expression of the major MMR proteins. There is a very  low probability that microsatellite instability (MSI) is present.  However, certain clinically significant MMR protein mutations may result  in preservation of nuclear expression. It is recommended that the  preservation of protein expression be correlated with molecular based  MSI testing.   IHC EXPRESSION RESULTS  TEST           RESULT  MLH1:          Preserved nuclear expression  MSH2:          Preserved nuclear expression  MSH6:          Preserved nuclear expression  PMS2:          Preserved nuclear expression   03/03/2020 Cancer Staging   Staging form: Colon and Rectum, AJCC 8th Edition - Pathologic stage from 03/03/2020: Stage IIIB (pT4a, pN1b, cM0) - Signed by Feng, Yan, MD on 01/28/2021 Stage prefix: Initial  diagnosis Histologic grading system: 4 grade system Histologic grade (G): G2 Residual tumor (R): R0 - None   03/08/2020 Imaging   CT AP 03/08/20 IMPRESSION: 1. Interval midline laparotomy with distal colon resection and diverting left lower quadrant colostomy. 2. Diffuse small bowel dilatation with gas fluid levels most consistent with postoperative ileus. 3. Trace ascites within the abdomen and pelvis. No fluid collection or abscess at this time. Surgical drain within the lower pelvis. 4. Indeterminate 1.4 cm subcapsular liver hypodensity. In light of newly diagnosed rectal cancer, metastatic disease cannot be excluded. PET CT may be useful for further evaluation. 5. Interval development of trace bilateral pleural effusions and diffuse body wall edema.   03/12/2020 Surgery   EXPLORATORY LAPAROTOMY WITH BOWEL RESECTION AND COLOSTOMY by Dr Martin 03/12/20  FINAL MICROSCOPIC DIAGNOSIS: 03/12/20  A. COLON, SPLENIC FLEXURE, RESECTION:  - Segment of colon (37 cm) with perforation and associated inflammation  - Multiple mucosal ulcers with necrotizing inflammation  - No evidence of malignancy    03/12/2020 Imaging   CT AP WO contrast 03/12/20  IMPRESSION: 1. Exam is limited by lack of intravenous contrast, motion, and artifact from the patient's arms adjacent to the torso. Since 03/08/2020, there has been development of relatively large pockets of intraperitoneal free air. While intraperitoneal gas would not be unexpected on postoperative day 9, this gas is new since an intervening study of 03/08/2020 and given the relatively large volume certainly raises concern for bowel perforation. No source for the intraperitoneal free air is evident on this study. There is a surgical drain in the pelvis, but there is no free gas around the drain itself to suggest that it represents the source. 2. New circumferential wall thickening in the splenic flexure, descending colon and sigmoid colon  leading into the end colostomy. Infection/inflammation would be a consideration. Ischemia cannot be excluded. 3. Relatively small volume intraperitoneal free fluid. High attenuation small fluid collections in the left upper abdomen may reflect hemorrhage, infection or residua from prior perforation. 4. Interval progression of diffuse body wall edema. 5. Residual contrast material in the renal parenchyma from prior imaging, compatible with renal dysfunction.    03/19/2020 Imaging   CT CAP 03/19/20  IMPRESSION: 1. 5.5 x 3.2 cm fluid collection is noted along the greater curvature of the proximal stomach. 2. Surgical drain is again noted in the pelvis with tip in left lower quadrant. 3. Interval development of crescent-shaped fluid collection measuring 16.4 x 3.3 cm in the epigastric region and left upper quadrant of the abdomen which may extend into the left lower quadrant. Potentially this may represent abscess or developing abscess. Multiple other smaller fluid collections are noted which may represent small abscesses. 4. 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 5. Mild amount of free fluid is noted in the posterior pelvis. 6. Moderate anasarca is noted. Aortic Atherosclerosis (ICD10-I70.0).   04/12/2020 Imaging   CT AP 04/12/20  IMPRESSION: 1. Fluid collections within the LEFT abdomen have significantly decreased compared to previous CT exams, now nearly completely resolved. 2. Percutaneous drainage catheter with tip coiled posterior to the LEFT kidney, stable positioning compared to the previous study. The more anterior catheter has been removed. 3. Small amount   of free fluid persists within the abdomen and pelvis. 4. Trace bibasilar pleural effusions. 5. Anasarca. 6. While reviewing today's study, comparing with a chest/abdomen/pelvis CT from earlier same month, there is question of thrombus in the LEFT internal jugular vein. Recommend ultrasound of the LEFT IJ to  exclude DVT. This recommendation discussed with patient's hospitalist, Dr. Manson Passey, on 04/12/2020 at 4:20 p.m.   Emphysema (ICD10-J43.9).   04/22/2020 Imaging   CT AP 04/22/20  IMPRESSION: 1. No recurrent intra-abdominal abscess status post interval removal of left retroperitoneal percutaneous drain. Stable small amount of free pelvic fluid. 2. Enlarging dependent bilateral pleural effusions, now moderate in volume. Associated atelectasis at both lung bases. 3. Progressive anasarca with generalized edema throughout the subcutaneous fat. 4. Stable small subcapsular fluid collection along the anterior aspect of the left hepatic lobe. 5. Aortic Atherosclerosis (ICD10-I70.0).   05/23/2020 Initial Diagnosis   Rectal cancer (HCC)   01/14/2021 Imaging   CT CAP IMPRESSION: -5.0 cm soft tissue mass along the posterior aspect of the cecum at the base of the appendix, favored to reflect a peritoneal/serosal implant, corresponding to the patient's known adenocarcinoma. This is new from the prior. -Suspected additional pelvic implants along the uterus and right adnexa, poorly visualized, new/progressive from the prior. -Additional pericapsular lesion along the lateral spleen is mildly progressive, suspicious for additional peritoneal implant. -No evidence of metastatic disease in the chest.   01/27/2021 PET scan   IMPRESSION:  1. Extensive hypermetabolic peritoneal metastasis, as detailed  above.  2. No evidence of hypermetabolic supradiaphragmatic disease.  3. Diffuse low-level thyroid hypermetabolism can be seen in the  setting of thyroiditis. Consider correlation with thyroid function  labs.  4. Aortic atherosclerosis (ICD10-I70.0) and emphysema (ICD10-J43.9).    02/24/2021 -  Chemotherapy      Patient is on Antibody Plan: COLORECTAL BEVACIZUMAB Q14D    Malignant neoplasm of appendix (HCC)  01/06/2021 Procedure   (surveillance colonoscopy for h/o rectal cancer) Impression:  -  Preparation of the colon was fair, lavage performed with mostly adequate views. - Polypoid lesion obliterating appendiceal orifice. Biopsied to evaluate for malignancy. - The examined portion of the ileum was normal. - Two large polyps in the cecum, not removed today pending path at appendiceal orifice, bowel prep. If removed endoscopically in the future would favor EMR to be done at the hospital. - One 5 mm polyp at the ileocecal valve, removed with a cold snare. Resected and retrieved. - One 3 mm polyp in the transverse colon, removed with a cold snare. Resected and retrieved. - Parastomal hernia making initial entry to the distal colon challenging. - The examination was otherwise normal.   01/06/2021 Initial Biopsy   Diagnosis 1. Colon, biopsy, appendiceal oraface - ADENOCARCINOMA. 2. Colon, polyp(s), ileocecal valve, transverse, x2 - TUBULAR ADENOMA, NEGATIVE FOR HIGH GRADE DYSPLASIA (X1). - COLONIC MUCOSA WITH UNDERLYING LYMPHOID AGGREGATE, NEGATIVE FOR DYSPLASIA (X1).  MMR normal   01/06/2021 Initial Diagnosis   Malignant neoplasm of appendix (HCC)   01/14/2021 Imaging   CT CAP IMPRESSION: -5.0 cm soft tissue mass along the posterior aspect of the cecum at the base of the appendix, favored to reflect a peritoneal/serosal implant, corresponding to the patient's known adenocarcinoma. This is new from the prior. -Suspected additional pelvic implants along the uterus and right adnexa, poorly visualized, new/progressive from the prior. -Additional pericapsular lesion along the lateral spleen is mildly progressive, suspicious for additional peritoneal implant. -No evidence of metastatic disease in the chest.      CURRENT  THERAPY:  ***  INTERVAL HISTORY: *** Tracy [name] is here for a follow up ***  REVIEW OF SYSTEMS:  *** Constitutional: Denies fevers, chills or abnormal weight loss Eyes: Denies blurriness of vision Ears, nose, mouth, throat, and face: Denies mucositis  or sore throat Respiratory: Denies cough, dyspnea or wheezes Cardiovascular: Denies palpitation, chest discomfort or lower extremity swelling Gastrointestinal:  Denies nausea, heartburn or change in bowel habits Skin: Denies abnormal skin rashes Lymphatics: Denies new lymphadenopathy or easy bruising Neurological:Denies numbness, tingling or new weaknesses Behavioral/Psych: Mood is stable, no new changes  All other systems were reviewed with the patient and are negative.  MEDICAL HISTORY:  Past Medical History:  Diagnosis Date  . Acute deep vein thrombosis (DVT) of brachial vein of right upper extremity ([place])   . Acute on chronic respiratory failure with hypoxia ([place])   . Asthma   . Cataract   . [place], HX OF 01/06/2011   Qualifier: Diagnosis of  By: Charlett Blake MD, Erline Levine    . COPD (chronic obstructive pulmonary disease) ([place]) 2011   FeV1 31% predicted FeV1/FVX 47 %  . Essential hypertension 05/23/2020  . Hemorrhoid   . Open right radial fracture 2020  . Osteopenia   . Perforated sigmoid colon ([place]) 03/03/2020  . Pleural effusion   . Pneumonia 2021  . Postoperative intra-abdominal abscess 2021  . Rectal cancer ([place])   . Seasonal allergies    takes Claritin daily prn  . Shock circulatory ([place])   . Tracheostomy status ([place])   . Vitamin D deficiency    takes Vit d every 14 days    SURGICAL HISTORY: Past Surgical History:  Procedure Laterality Date  . AUGMENTATION MAMMAPLASTY     saline  . EXAMINATION UNDER ANESTHESIA  10/14/2012   Procedure: EXAM UNDER ANESTHESIA;  Surgeon: Gayland Curry, MD,FACS;  Location: [place];  Service: General;  Laterality: N/A;  rectal exam under anesthesia excisional hemorrhoidectomy hemorrhoidal banding x two  . EXAMINATION UNDER ANESTHESIA  02/03/2013   excision hemorrhoidal tissue  . FOOT SURGERY     left bunionectomy  . HEMORRHOIDECTOMY WITH HEMORRHOID BANDING  10/14/2012   Procedure: HEMORRHOIDECTOMY WITH HEMORRHOID BANDING;  Surgeon: Gayland Curry, MD,FACS;  Location: [place];  Service: General;  Laterality: N/A;  . IR IMAGING GUIDED PORT INSERTION  02/03/2021  . IR THORACENTESIS ASP PLEURAL SPACE W/IMG GUIDE  04/23/2020  . LAPAROTOMY N/A 03/03/2020   Procedure: low anterior resection end colostomy;  Surgeon: [name], MD;  Location: [facility];  Service: General;  Laterality: N/A;  . LAPAROTOMY N/A 03/12/2020   Procedure: EXPLORATORY LAPAROTOMY WITH BOWEL RESECTION AND COLOSTOMY;  Surgeon: [name], MD;  Location: [facility];  Service: General;  Laterality: N/A;  . OPEN REDUCTION INTERNAL FIXATION (ORIF) DISTAL RADIAL FRACTURE Right 11/01/2018   Procedure: OPEN REDUCTION INTERNAL FIXATION (ORIF) DISTAL RADIAL FRACTURE;  Surgeon: [name], MD;  Location: [place];  Service: Orthopedics;  Laterality: Right;  . TONSILLECTOMY  1960   recurrent otitis media  . US ECHOCARDIOGRAPHY  02/2020    poor windows, normal LV function, severely dilated RV with moderately reduced function, RV volume and pressure overload, mildly dilated RA    I have reviewed the social history and family history with the patient and they are unchanged from previous note.  ALLERGIES:  is allergic to amlodipine and codeine.  MEDICATIONS:  Current Outpatient Medications  Medication Sig Dispense Refill  . albuterol (PROVENTIL) (2.5 MG/3ML) 0.083% nebulizer solution Take 3  mLs (2.5 mg total) by nebulization every 4 (four) hours as needed for wheezing or shortness of breath. DX: J44.9 360 mL 5  . albuterol (VENTOLIN HFA) 108 (90 Base) MCG/ACT inhaler Inhale 2 puffs into the lungs every 6 (six) hours as needed for wheezing or shortness of breath. 8 g 5  . ALPRAZolam (XANAX) 0.25 MG tablet Take 1 tablet (0.25 mg total) by mouth at bedtime as needed for anxiety. 30 tablet 0  . capecitabine (XELODA) 500 MG tablet Take 3 tablets (1,500 mg total) by mouth 2 (two) times daily after a meal. Take for 7 days on, 7 days off, repeat every 14 days. 84 tablet  0  . diphenhydrAMINE (BENADRYL) 25 mg capsule Take 25 mg by mouth every 6 (six) hours as needed.    . docusate sodium (COLACE) 50 MG capsule Take 50 mg by mouth daily as needed for mild constipation.    . fluticasone (FLONASE) 50 MCG/ACT nasal spray Place 2 sprays into both nostrils daily. 16 mL 5  . Fluticasone-Umeclidin-Vilant (TRELEGY ELLIPTA) 100-62.5-25 MCG/INH AEPB Inhale 1 puff into the lungs daily. 60 each 5  . lidocaine-prilocaine (EMLA) cream Apply 1 application topically as needed. 30 g 0  . ondansetron (ZOFRAN) 8 MG tablet Take 1 tablet (8 mg total) by mouth every 8 (eight) hours as needed for nausea or vomiting. 20 tablet 2   No current facility-administered medications for this visit.   Facility-Administered Medications Ordered in Other Visits  Medication Dose Route Frequency Jatavia Keltner Last Rate Last Admin  . heparin lock flush 100 unit/mL  500 Units Intracatheter Once PRN [name], MD      . sodium chloride flush (NS) 0.9 % injection 10 mL  10 mL Intracatheter PRN [name], MD        PHYSICAL EXAMINATION: ECOG PERFORMANCE STATUS: {CHL ONC ECOG VO:[id number]}  There were no vitals filed for this visit. There were no vitals filed for this visit. *** GENERAL:alert, no distress and comfortable SKIN: skin color, texture, turgor are normal, no rashes or significant lesions EYES: normal, Conjunctiva are pink and non-injected, sclera clear {OROPHARYNX:no exudate, no erythema and lips, buccal mucosa, and tongue normal}  NECK: supple, thyroid normal size, non-tender, without nodularity LYMPH:  no palpable lymphadenopathy in the cervical, axillary {or inguinal} LUNGS: clear to auscultation and percussion with normal breathing effort HEART: regular rate & rhythm and no murmurs and no lower extremity edema ABDOMEN:abdomen soft, non-tender and normal bowel sounds Musculoskeletal:no cyanosis of digits and no clubbing  NEURO: alert & oriented x 3 with fluent speech, no focal  motor/sensory deficits  LABORATORY DATA:  I have reviewed the data as listed CBC Latest Ref Rng & Units 02/24/2021 02/24/2021 02/13/2021  WBC 4.0 - 10.5 K/uL - 7.2 8.5  Hemoglobin 12.0 - 15.0 g/dL - 12.7 14.0  Hematocrit 36.0 - 46.0 % - 38.4 43.0  Platelets 150 - 400 K/uL 304 PLATELET CLUMPS NOTED ON SMEAR, UNABLE TO ESTIMATE 381     CMP Latest Ref Rng & Units 02/24/2021 02/13/2021 01/29/2021  Glucose 70 - 99 mg/dL 92 102(H) 89  BUN 8 - 23 mg/dL 25(H) 24(H) 17  Creatinine 0.44 - 1.00 mg/dL 0.91 0.98 1.14(H)  Sodium 135 - 145 mmol/L 137 141 142  Potassium 3.5 - 5.1 mmol/L 4.2 4.7 4.7  Chloride 98 - 111 mmol/L 105 104 106  CO2 22 - 32 mmol/L $RemoveB'24 26 26  'CigXzRxK$ Calcium 8.9 - 10.3 mg/dL 9.2 9.7 9.6  Total Protein 6.5 - 8.1 g/dL 6.8  7.8 7.7  Total Bilirubin 0.3 - 1.2 mg/dL 0.5 0.4 0.4  Alkaline Phos 38 - 126 U/L 81 103 107  AST 15 - 41 U/L 25 33 17  ALT 0 - 44 U/L $Remo'17 21 13      'FRGLV$ RADIOGRAPHIC STUDIES: I have personally reviewed the radiological images as listed and agreed with the findings in the report. No results found.   ASSESSMENT & PLAN:  ERIYANA SWEETEN is a 70 y.o. female with      No problem-specific Assessment & Plan notes found for this encounter.   No orders of the defined types were placed in this encounter.  All questions were answered. The patient knows to call the clinic with any problems, questions or concerns. No barriers to learning was detected. The total time spent in the appointment was {CHL ONC TIME VISIT - XHFSF:[id number]}.     Joslyn Devon 02/24/2021   Oneal Deputy, am acting as scribe for [name], MD.   {Add scribe attestation statement}

## 2021-02-24 NOTE — Patient Instructions (Signed)

## 2021-02-25 ENCOUNTER — Other Ambulatory Visit: Payer: Medicare HMO

## 2021-02-25 ENCOUNTER — Ambulatory Visit: Payer: Medicare HMO

## 2021-02-25 ENCOUNTER — Ambulatory Visit: Payer: Medicare HMO | Admitting: Nurse Practitioner

## 2021-02-26 ENCOUNTER — Other Ambulatory Visit: Payer: Self-pay | Admitting: Hematology

## 2021-02-26 ENCOUNTER — Telehealth: Payer: Self-pay | Admitting: Hematology

## 2021-02-26 ENCOUNTER — Telehealth: Payer: Self-pay

## 2021-02-26 MED ORDER — TRAMADOL HCL 50 MG PO TABS: 50.0000 mg | ORAL_TABLET | Freq: Four times a day (QID) | ORAL | 0 refills | Status: DC | PRN

## 2021-02-26 NOTE — Telephone Encounter (Signed)
Ms Tracy [name] called stating her lower abdominal pain is no longer controlled with tylenol.  She is requesting something stronger.

## 2021-02-26 NOTE — Telephone Encounter (Signed)
Scheduled follow-up appointments per 3/14 los. Patient is aware. ?

## 2021-02-27 ENCOUNTER — Inpatient Hospital Stay: Payer: Medicare HMO | Admitting: Hematology

## 2021-03-06 ENCOUNTER — Other Ambulatory Visit: Payer: Self-pay

## 2021-03-06 ENCOUNTER — Telehealth: Payer: Self-pay

## 2021-03-06 DIAGNOSIS — J019 Acute sinusitis, unspecified: Secondary | ICD-10-CM

## 2021-03-06 MED ORDER — AMOXICILLIN-POT CLAVULANATE 875-125 MG PO TABS: 1.0000 | ORAL_TABLET | Freq: Two times a day (BID) | ORAL | 0 refills | Status: DC

## 2021-03-06 NOTE — Telephone Encounter (Signed)
Tracy [name] called stating she has a sinus infection.  She c/o sinus pain and pressure, coughing up yellow phlemp, and she has runny nose.  Her voice his hoarse.  She states she feels like she has a low grade fever but she has not taken her temperature. She denies covid exposre.  She is taking flonase, benadryl and tylenol.  She is requesting an antibiotic

## 2021-03-06 NOTE — Telephone Encounter (Signed)
Dr Burr Medico reviewed Tracy [name]'s symptoms.  Dr Burr Medico recommends covid testing , Tracy [name] is reluctant.  I let Tracy [name] know Dr Burr Medico send rx for Autmentin 1 tab twiced daily for 7 days.  She verbalized understanding

## 2021-03-07 NOTE — Progress Notes (Signed)
Tracy [name]   [phone] Fax:([phone]   Clinic Follow up Note   Patient Care Team: [name], DO as PCP - General (Family Medicine) Jonnie Finner, RN as Oncology Nurse Navigator [name], MD as Consulting Physician (Hematology) Icard, Octavio Graves, DO as Consulting Physician (Pulmonary Disease) [place], P.A. [name], MD as Consulting Physician (General Surgery) [name], MD as Consulting Physician (General Surgery) [name], [name], MD as Consulting Physician (Gastroenterology)  Date of Service:  03/10/2021  CHIEF COMPLAINT: F/u of rectal cancer  SUMMARY OF ONCOLOGIC HISTORY: Oncology History Overview Note  Cancer Staging Rectal cancer [facility]) Staging form: Colon and Rectum, AJCC 8th Edition - Pathologic stage from 03/03/2020: Stage IIIB (pT4a, pN1b, cM0) - Signed by [name], MD on 01/28/2021 Stage prefix: Initial diagnosis Histologic grading system: 4 grade system Histologic grade (G): G2 Residual tumor (R): R0 - None    Rectal cancer ([place])  03/03/2020 - 04/05/2020 Hospital Admission   She was admitted to ED on 03/03/20 for abdominal pain and nausea. She had been having diarrhea for several weeks. During hospital stay she developed acute respiratory failure, AKI, RUE DVT from PICC line. Work up showed bowel perforation, small left liver mass and thickening of jejunum. She underwent emergent surgery on 03/03/20 for resection and colostomy placement. Her path showed invasive cancer, metastatic to 3/15 LNs. She had a NGT in placed but this was removed on POD 3. Post op her stoma became necrotic and septic. She had another bowel surgery on 03/12/20, which was NED with necrotic tissue.    03/03/2020 Imaging   CT AP 03/03/20  IMPRESSION: 1. Free intraperitoneal air consistent perforation. Most likely site of perforation is in the distal splenic flexure/proximal descending colon where there is a collection of air  and gas measuring 6 centimeters. No obvious soft tissue mass identified in this region. At this site, there is abrupt transition of dilated, stool-filled colon to completely decompressed proximal descending colon. 2. Thickened, inflamed loops of jejunum are identified within the pelvis and are likely reactive. 3. Small hiatal hernia. 4. Benign-appearing 1.6 centimeter mass the LEFT hepatic lobe. Recommend comparison with prior studies if available. 5.  Emphysema (ICD10-J43.9). 6.  Aortic Atherosclerosis (ICD10-I70.0). 7. Bilateral renal scarring.   03/03/2020 Surgery   low anterior resection end colostomy by Dr Marcello Moores    03/03/2020 Initial Biopsy   FINAL MICROSCOPIC DIAGNOSIS: 03/03/20 A. RECTOSIGMOID COLON, LOW ANTERIOR RESECTION:  - Invasive colonic adenocarcinoma, 3.5 cm.  - Tumor invades the visceral peritoneum.  - Margins of resection are not involved.  - Metastatic carcinoma in (3) of (15) lymph nodes.  - See oncology table.   B. ADDITIONAL SIGMOID COLON, RESECTION:  - Colonic tissue, negative for carcinoma.  ADDENDUM:  Mismatch Repair Protein (IHC)   SUMMARY INTERPRETATION: NORMAL  There is preserved expression of the major MMR proteins. There is a very  low probability that microsatellite instability (MSI) is present.  However, certain clinically significant MMR protein mutations may result  in preservation of nuclear expression. It is recommended that the  preservation of protein expression be correlated with molecular based  MSI testing.   IHC EXPRESSION RESULTS  TEST           RESULT  MLH1:          Preserved nuclear expression  MSH2:          Preserved nuclear expression  MSH6:          Preserved  nuclear expression  PMS2:          Preserved nuclear expression   03/03/2020 Cancer Staging   Staging form: Colon and Rectum, AJCC 8th Edition - Pathologic stage from 03/03/2020: Stage IIIB (pT4a, pN1b, cM0) - Signed by [name], MD on 01/28/2021 Stage prefix: Initial  diagnosis Histologic grading system: 4 grade system Histologic grade (G): G2 Residual tumor (R): R0 - None   03/08/2020 Imaging   CT AP 03/08/20 IMPRESSION: 1. Interval midline laparotomy with distal colon resection and diverting left lower quadrant colostomy. 2. Diffuse small bowel dilatation with gas fluid levels most consistent with postoperative ileus. 3. Trace ascites within the abdomen and pelvis. No fluid collection or abscess at this time. Surgical drain within the lower pelvis. 4. Indeterminate 1.4 cm subcapsular liver hypodensity. In light of newly diagnosed rectal cancer, metastatic disease cannot be excluded. PET CT may be useful for further evaluation. 5. Interval development of trace bilateral pleural effusions and diffuse body wall edema.   03/12/2020 Surgery   EXPLORATORY LAPAROTOMY WITH BOWEL RESECTION AND COLOSTOMY by Dr Hassell Done 03/12/20  FINAL MICROSCOPIC DIAGNOSIS: 03/12/20  A. COLON, SPLENIC FLEXURE, RESECTION:  - Segment of colon (37 cm) with perforation and associated inflammation  - Multiple mucosal ulcers with necrotizing inflammation  - No evidence of malignancy    03/12/2020 Imaging   CT AP WO contrast 03/12/20  IMPRESSION: 1. Exam is limited by lack of intravenous contrast, motion, and artifact from the patient's arms adjacent to the torso. Since 03/08/2020, there has been development of relatively large pockets of intraperitoneal free air. While intraperitoneal gas would not be unexpected on postoperative day 9, this gas is new since an intervening study of 03/08/2020 and given the relatively large volume certainly raises concern for bowel perforation. No source for the intraperitoneal free air is evident on this study. There is a surgical drain in the pelvis, but there is no free gas around the drain itself to suggest that it represents the source. 2. New circumferential wall thickening in the splenic flexure, descending colon and sigmoid colon  leading into the end colostomy. Infection/inflammation would be a consideration. Ischemia cannot be excluded. 3. Relatively small volume intraperitoneal free fluid. High attenuation small fluid collections in the left upper abdomen may reflect hemorrhage, infection or residua from prior perforation. 4. Interval progression of diffuse body wall edema. 5. Residual contrast material in the renal parenchyma from prior imaging, compatible with renal dysfunction.    03/19/2020 Imaging   CT CAP 03/19/20  IMPRESSION: 1. 5.5 x 3.2 cm fluid collection is noted along the greater curvature of the proximal stomach. 2. Surgical drain is again noted in the pelvis with tip in left lower quadrant. 3. Interval development of crescent-shaped fluid collection measuring 16.4 x 3.3 cm in the epigastric region and left upper quadrant of the abdomen which may extend into the left lower quadrant. Potentially this may represent abscess or developing abscess. Multiple other smaller fluid collections are noted which may represent small abscesses. 4. 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 5. Mild amount of free fluid is noted in the posterior pelvis. 6. Moderate anasarca is noted. Aortic Atherosclerosis (ICD10-I70.0).   04/12/2020 Imaging   CT AP 04/12/20  IMPRESSION: 1. Fluid collections within the LEFT abdomen have significantly decreased compared to previous CT exams, now nearly completely resolved. 2. Percutaneous drainage catheter with tip coiled posterior to the LEFT kidney, stable positioning compared to the previous study. The more anterior catheter has been removed.  3. Small amount of free fluid persists within the abdomen and pelvis. 4. Trace bibasilar pleural effusions. 5. Anasarca. 6. While reviewing today's study, comparing with a chest/abdomen/pelvis CT from earlier same month, there is question of thrombus in the LEFT internal jugular vein. Recommend ultrasound of the LEFT IJ to  exclude DVT. This recommendation discussed with patient's hospitalist, Dr. Owens Shark, on 04/12/2020 at 4:20 p.m.   Emphysema (ICD10-J43.9).   04/22/2020 Imaging   CT AP 04/22/20  IMPRESSION: 1. No recurrent intra-abdominal abscess status post interval removal of left retroperitoneal percutaneous drain. Stable small amount of free pelvic fluid. 2. Enlarging dependent bilateral pleural effusions, now moderate in volume. Associated atelectasis at both lung bases. 3. Progressive anasarca with generalized edema throughout the subcutaneous fat. 4. Stable small subcapsular fluid collection along the anterior aspect of the left hepatic lobe. 5. Aortic Atherosclerosis (ICD10-I70.0).   05/23/2020 Initial Diagnosis   Rectal cancer ([place])   01/14/2021 Imaging   CT CAP IMPRESSION: -5.0 cm soft tissue mass along the posterior aspect of the cecum at the base of the appendix, favored to reflect a peritoneal/serosal implant, corresponding to the patient's known adenocarcinoma. This is new from the prior. -Suspected additional pelvic implants along the uterus and right adnexa, poorly visualized, new/progressive from the prior. -Additional pericapsular lesion along the lateral spleen is mildly progressive, suspicious for additional peritoneal implant. -No evidence of metastatic disease in the chest.   01/27/2021 PET scan   IMPRESSION:  1. Extensive hypermetabolic peritoneal metastasis, as detailed  above.  2. No evidence of hypermetabolic supradiaphragmatic disease.  3. Diffuse low-level thyroid hypermetabolism can be seen in the  setting of thyroiditis. Consider correlation with thyroid function  labs.  4. Aortic atherosclerosis (ICD10-I70.0) and emphysema (ICD10-J43.9).    02/01/2021 -  Chemotherapy   First line chemo Xeloda 1500 mg twice daily for day 1-14, every 21 days, started on 02/01/2021. Reduced to 1 week on/ 1 week off and added bevacizumab q2weeks from C2 on 02/24/21. 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 - Polypoid lesion obliterating appendiceal orifice. Biopsied to evaluate for malignancy. - The examined portion of the ileum was normal. - Two large polyps in the cecum, not removed today pending path at appendiceal orifice, bowel prep. If removed endoscopically in the future would favor EMR to be done at the hospital. - One 5 mm polyp at the ileocecal valve, removed with a cold snare. Resected and retrieved. - One 3 mm polyp in the transverse colon, removed with a cold snare. Resected and retrieved. - Parastomal hernia making initial entry to the distal colon challenging. - The examination was otherwise normal.   01/06/2021 Initial Biopsy   Diagnosis 1. Colon, biopsy, appendiceal oraface - ADENOCARCINOMA. 2. Colon, polyp(s), ileocecal valve, transverse, x2 - TUBULAR ADENOMA, NEGATIVE FOR HIGH GRADE DYSPLASIA (X1). - COLONIC MUCOSA WITH UNDERLYING LYMPHOID AGGREGATE, NEGATIVE FOR DYSPLASIA (X1).  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 This is new from the prior. -Suspected additional pelvic implants along the uterus and right adnexa, poorly visualized, new/progressive from the prior. -Additional pericapsular lesion along the lateral spleen is mildly progressive, suspicious for additional peritoneal implant. -No evidence of metastatic  disease in the chest.      CURRENT THERAPY:  First line chemo Xeloda 1500 mg twice daily for day 1-14, every 21 days, started on 02/01/2021. Reduced to 1 week on/ 1 week off and added bevacizumab q2weeks from C2 on 02/24/21.   INTERVAL HISTORY:  Tracy [name] is here for a follow up. She presents to the clinic with her husband. She notes she is doing well. She notes her recent sinus infection symptoms have much improved on antibiotics, will continue until completion. She notes she had 7 BM in 5-6 days with watery stool. She attributes this to her recent antibiotics. I reviewed her medication list with her. She does not use Tramadol anymore.     REVIEW OF SYSTEMS:   Constitutional: Denies fevers, chills or abnormal weight loss Eyes: Denies blurriness of vision Ears, nose, mouth, throat, and face: Denies mucositis or sore throat Respiratory: Denies cough, dyspnea or wheezes Cardiovascular: Denies palpitation, chest discomfort or lower extremity swelling Gastrointestinal:  Denies nausea, heartburn or change in bowel habits Skin: Denies abnormal skin rashes Lymphatics: Denies new lymphadenopathy or easy bruising Neurological:Denies numbness, tingling or new weaknesses Behavioral/Psych: Mood is stable, no new changes  All other systems were reviewed with the patient and are negative.  MEDICAL HISTORY:  Past Medical History:  Diagnosis Date  . Acute deep vein thrombosis (DVT) of brachial vein of right upper extremity ([place])   . Acute on chronic respiratory failure with hypoxia ([employer])   . Asthma   . Cataract   . [place], HX OF 01/06/2011   Qualifier: Diagnosis of  By: Charlett Blake MD, Erline Levine    . COPD (chronic obstructive pulmonary disease) ([place]) 2011   FeV1 31% predicted FeV1/FVX 47 %  . Essential hypertension 05/23/2020  . Hemorrhoid   . Open right radial fracture 2020  . Osteopenia   . Perforated sigmoid colon ([place]) 03/03/2020  . Pleural effusion   . Pneumonia 2021  . Postoperative  intra-abdominal abscess 2021  . Rectal cancer ([place])   . Seasonal allergies    takes Claritin daily prn  . Shock circulatory ([place])   . Tracheostomy status ([place])   . Vitamin D deficiency    takes Vit d every 14 days    SURGICAL HISTORY: Past Surgical History:  Procedure Laterality Date  . AUGMENTATION MAMMAPLASTY     saline  . EXAMINATION UNDER ANESTHESIA  10/14/2012   Procedure: EXAM UNDER ANESTHESIA;  Surgeon: Gayland Curry, MD,FACS;  Location: [facility];  Service: General;  Laterality: N/A;  rectal exam under anesthesia excisional hemorrhoidectomy hemorrhoidal banding x two  . EXAMINATION UNDER ANESTHESIA  02/03/2013   excision hemorrhoidal tissue  . FOOT SURGERY     left bunionectomy  . HEMORRHOIDECTOMY WITH HEMORRHOID BANDING  10/14/2012   Procedure: HEMORRHOIDECTOMY WITH HEMORRHOID BANDING;  Surgeon: Gayland Curry, MD,FACS;  Location: [name];  Service: General;  Laterality: N/A;  . IR IMAGING GUIDED PORT INSERTION  02/03/2021  . IR THORACENTESIS ASP PLEURAL SPACE W/IMG GUIDE  04/23/2020  . LAPAROTOMY N/A 03/03/2020   Procedure: low anterior resection end colostomy;  Surgeon: [name], MD;  Location: [facility];  Service: General;  Laterality: N/A;  . LAPAROTOMY N/A 03/12/2020  Procedure: EXPLORATORY LAPAROTOMY WITH BOWEL RESECTION AND COLOSTOMY;  Surgeon: [name], MD;  Location: [facility];  Service: General;  Laterality: N/A;  . OPEN REDUCTION INTERNAL FIXATION (ORIF) DISTAL RADIAL FRACTURE Right 11/01/2018   Procedure: OPEN REDUCTION INTERNAL FIXATION (ORIF) DISTAL RADIAL FRACTURE;  Surgeon: [name], MD;  Location: [facility];  Service: Orthopedics;  Laterality: Right;  . TONSILLECTOMY  1960   recurrent otitis media  . US ECHOCARDIOGRAPHY  02/2020    poor windows, normal LV function, severely dilated RV with moderately reduced function, RV volume and pressure overload, mildly dilated RA    I have reviewed the social history and family history with the patient  and they are unchanged from previous note.  ALLERGIES:  is allergic to amlodipine and codeine.  MEDICATIONS:  Current Outpatient Medications  Medication Sig Dispense Refill  . albuterol (PROVENTIL) (2.5 MG/3ML) 0.083% nebulizer solution Take 3 mLs (2.5 mg total) by nebulization every 4 (four) hours as needed for wheezing or shortness of breath. DX: J44.9 360 mL 5  . albuterol (VENTOLIN HFA) 108 (90 Base) MCG/ACT inhaler Inhale 2 puffs into the lungs every 6 (six) hours as needed for wheezing or shortness of breath. 8 g 5  . ALPRAZolam (XANAX) 0.25 MG tablet Take 1 tablet (0.25 mg total) by mouth at bedtime as needed for anxiety. 30 tablet 0  . amoxicillin-clavulanate (AUGMENTIN) 875-125 MG tablet Take 1 tablet by mouth 2 (two) times daily. 14 tablet 0  . capecitabine (XELODA) 500 MG tablet Take 3 tablets (1,500 mg total) by mouth 2 (two) times daily after a meal. Take for 7 days on, 7 days off, repeat every 14 days. 84 tablet 0  . diphenhydrAMINE (BENADRYL) 25 mg capsule Take 25 mg by mouth every 6 (six) hours as needed.    . docusate sodium (COLACE) 50 MG capsule Take 50 mg by mouth daily as needed for mild constipation.    . fluticasone (FLONASE) 50 MCG/ACT nasal spray Place 2 sprays into both nostrils daily. 16 mL 5  . Fluticasone-Umeclidin-Vilant (TRELEGY ELLIPTA) 100-62.5-25 MCG/INH AEPB Inhale 1 puff into the lungs daily. 60 each 5  . lidocaine-prilocaine (EMLA) cream Apply 1 application topically as needed. 30 g 0  . ondansetron (ZOFRAN) 8 MG tablet Take 1 tablet (8 mg total) by mouth every 8 (eight) hours as needed for nausea or vomiting. 20 tablet 2  . traMADol (ULTRAM) 50 MG tablet Take 1 tablet (50 mg total) by mouth every 6 (six) hours as needed. 30 tablet 0   No current facility-administered medications for this visit.    PHYSICAL EXAMINATION: ECOG PERFORMANCE STATUS: 2 - Symptomatic, <50% confined to bed  Vitals:   03/10/21 1340  BP: 136/78  Pulse: 94  Resp: 18  Temp:  98.2 F (36.8 C)  SpO2: 93%   Filed Weights   03/10/21 1340  Weight: 132 lb 1.6 oz (59.9 kg)    Due to COVID19 we will limit examination to appearance. Patient had no complaints.  GENERAL:alert, no distress and comfortable SKIN: skin color normal, no rashes or significant lesions EYES: normal, Conjunctiva are pink and non-injected, sclera clear  NEURO: alert & oriented x 3 with fluent speech   LABORATORY DATA:  I have reviewed the data as listed CBC Latest Ref Rng & Units 03/10/2021 02/24/2021 02/24/2021  WBC 4.0 - 10.5 K/uL 9.8 - 7.2  Hemoglobin 12.0 - 15.0 g/dL 13.4 - 12.7  Hematocrit 36.0 - 46.0 % 40.7 - 38.4  Platelets 150 -  400 K/uL 369 304 PLATELET CLUMPS NOTED ON SMEAR, UNABLE TO ESTIMATE     CMP Latest Ref Rng & Units 03/10/2021 02/24/2021 02/13/2021  Glucose 70 - 99 mg/dL 107(H) 92 102(H)  BUN 8 - 23 mg/dL 22 25(H) 24(H)  Creatinine 0.44 - 1.00 mg/dL 0.97 0.91 0.98  Sodium 135 - 145 mmol/L 140 137 141  Potassium 3.5 - 5.1 mmol/L 4.5 4.2 4.7  Chloride 98 - 111 mmol/L 101 105 104  CO2 22 - 32 mmol/L _0 Calcium 8.9 - 10.3 mg/dL 9.3 9.2 9.7  Total Protein 6.5 - 8.1 g/dL 7.6 6.8 7.8  Total Bilirubin 0.3 - 1.2 mg/dL 0.4 0.5 0.4  Alkaline Phos 38 - 126 U/L 110 81 103  AST 15 - 41 U/L 26 25 33  ALT 0 - 44 U/L _1 RADIOGRAPHIC STUDIES: I have personally reviewed the radiological images as listed and agreed with the findings in the report. No results found.   ASSESSMENT & PLAN:  AKEIBA AXELSON is a 70 y.o. female with    1. Rectosigmoid cancer(perforated),Stage III,pT4aN1b,with peroration,MSI Stable, peritoneal metastasis in 01/2021 -She was diagnosed in 02/2020.Workup showed she had bowel perforation and underwent emergent surgery. Pathology showed invasiverectosigmoidadenocarcinoma and perforatedbowel with3/15positiveLN.Margins were clear. She haspermanentcolostomy bag in place. Her initial CT scan was negative for distant mets. -She  hasstage III colon cancer and with bowel perforation she hasveryhigh risk of cancer recurrence,unfortunately she was not a candidate for adjuvant chemo after surgery due to very slow recovery.  -She did opt to proceed withGuardant Revealstudy in 06/06/20, 07/12/20 and 9/28/21which were allnegative.  -Unfortunately her screening colonoscopy on January 06, 2021 which showed a mass in theappendiceal orificeandbiopsy confirmed adenocarcinoma.01/27/21 PET showed tumor inappendiceal orificeis likely peritoneal met growing into cecum.  -Her peritoneal metastasis is mainly in the pelvis, difficult to biopsy.Her PET scan ispretty convincing for metastatic disease, I do not strongly feel she needs biopsy to confirm. -I started her on First-line chemo Xeloda 1500 mg twice daily for day 1-14, every 21 days, started on 02/01/2021. Reduced to 1 week on/ 1 week off and added bevacizumab q2weeks from C2 on 02/24/21. If she continues to tolerate will add Oxaliplatin for better disease control with next cycle. Plan to do CAPOX evey 2 weeks with low dose Oxaliplaitn  -Labs reviewed and adequate to proceed with Beva today. Will start C3 Xeloda today at same dose.  -F/u in 2 weeks. Will add oxaliplatin then   2.ProvokedRUE DVTon 04/04/20. S/p 3 months of Eliquis  3. Comorbidities: Asthma, COPD, Anxiety -On nebulizer, Cymbalta andXanax 0.76m 1-2 daily, Benadryl and Flonase  -Continue to f/u with pulmonologist and she can request rescue inhaler from them.   4.Goal of care discussion  -The patient understands the goal of care is palliative. -she is full code for now  5. Recent Sinus infection  -She had recent sinus infection. I started her on Augmentin (03/06/21). She will complete  -her symptoms have much improved. She has had more loose stool recently. Likely from antibiotics   PLAN: -Labs reviewed and adequate to proceed with Beva today.  -Start C3 Xeloda 15074mBID 1 week on/1 week off  today. I refilled today  -Lab, flush, f/u and Oxaliplatin and Beva in 2 weeks    No problem-specific Assessment & Plan notes found for this encounter.   No orders of the defined types were placed in this encounter.  All questions were answered. The patient knows  to call the clinic with any problems, questions or concerns. No barriers to learning was detected. The total time spent in the appointment was 30 minutes.     [name], MD 03/10/2021   I, Joslyn Devon, am acting as scribe for [name], MD.   I have reviewed the above documentation for accuracy and completeness, and I agree with the above.

## 2021-03-10 ENCOUNTER — Inpatient Hospital Stay: Payer: Medicare HMO | Admitting: Hematology

## 2021-03-10 ENCOUNTER — Inpatient Hospital Stay: Payer: Medicare HMO

## 2021-03-10 ENCOUNTER — Other Ambulatory Visit: Payer: Self-pay

## 2021-03-10 ENCOUNTER — Encounter: Payer: Self-pay | Admitting: Hematology

## 2021-03-10 VITALS — BP 147/60 | HR 63

## 2021-03-10 VITALS — BP 136/78 | HR 94 | Temp 98.2°F | Resp 18 | Ht 62.0 in | Wt 132.1 lb

## 2021-03-10 DIAGNOSIS — C19 Malignant neoplasm of rectosigmoid junction: Secondary | ICD-10-CM | POA: Diagnosis not present

## 2021-03-10 DIAGNOSIS — I10 Essential (primary) hypertension: Secondary | ICD-10-CM | POA: Diagnosis not present

## 2021-03-10 DIAGNOSIS — C181 Malignant neoplasm of appendix: Secondary | ICD-10-CM | POA: Diagnosis not present

## 2021-03-10 DIAGNOSIS — C20 Malignant neoplasm of rectum: Secondary | ICD-10-CM | POA: Diagnosis not present

## 2021-03-10 DIAGNOSIS — J45909 Unspecified asthma, uncomplicated: Secondary | ICD-10-CM | POA: Diagnosis not present

## 2021-03-10 DIAGNOSIS — R69 Illness, unspecified: Secondary | ICD-10-CM | POA: Diagnosis not present

## 2021-03-10 DIAGNOSIS — Z933 Colostomy status: Secondary | ICD-10-CM | POA: Diagnosis not present

## 2021-03-10 DIAGNOSIS — Z86718 Personal history of other venous thrombosis and embolism: Secondary | ICD-10-CM | POA: Diagnosis not present

## 2021-03-10 DIAGNOSIS — M8580 Other specified disorders of bone density and structure, unspecified site: Secondary | ICD-10-CM | POA: Diagnosis not present

## 2021-03-10 DIAGNOSIS — Z5112 Encounter for antineoplastic immunotherapy: Secondary | ICD-10-CM | POA: Diagnosis not present

## 2021-03-10 DIAGNOSIS — C786 Secondary malignant neoplasm of retroperitoneum and peritoneum: Secondary | ICD-10-CM | POA: Diagnosis not present

## 2021-03-10 DIAGNOSIS — Z95828 Presence of other vascular implants and grafts: Secondary | ICD-10-CM

## 2021-03-10 DIAGNOSIS — J449 Chronic obstructive pulmonary disease, unspecified: Secondary | ICD-10-CM

## 2021-03-10 LAB — CBC WITH DIFFERENTIAL (CANCER CENTER ONLY)
Abs Immature Granulocytes: 0.04 10*3/uL (ref 0.00–0.07)
Basophils Absolute: 0.1 10*3/uL (ref 0.0–0.1)
Basophils Relative: 1 %
Eosinophils Absolute: 0.1 10*3/uL (ref 0.0–0.5)
Eosinophils Relative: 1 %
HCT: 40.7 % (ref 36.0–46.0)
Hemoglobin: 13.4 g/dL (ref 12.0–15.0)
Immature Granulocytes: 0 %
Lymphocytes Relative: 21 %
Lymphs Abs: 2 10*3/uL (ref 0.7–4.0)
MCH: 33.3 pg (ref 26.0–34.0)
MCHC: 32.9 g/dL (ref 30.0–36.0)
MCV: 101 fL — ABNORMAL HIGH (ref 80.0–100.0)
Monocytes Absolute: 0.7 10*3/uL (ref 0.1–1.0)
Monocytes Relative: 7 %
Neutro Abs: 6.9 10*3/uL (ref 1.7–7.7)
Neutrophils Relative %: 70 %
Platelet Count: 369 10*3/uL (ref 150–400)
RBC: 4.03 MIL/uL (ref 3.87–5.11)
RDW: 14.5 % (ref 11.5–15.5)
WBC Count: 9.8 10*3/uL (ref 4.0–10.5)
nRBC: 0 % (ref 0.0–0.2)

## 2021-03-10 LAB — TOTAL PROTEIN, URINE DIPSTICK: Protein, ur: NEGATIVE mg/dL

## 2021-03-10 LAB — CMP (CANCER CENTER ONLY)
ALT: 18 U/L (ref 0–44)
AST: 26 U/L (ref 15–41)
Albumin: 4 g/dL (ref 3.5–5.0)
Alkaline Phosphatase: 110 U/L (ref 38–126)
Anion gap: 15 (ref 5–15)
BUN: 22 mg/dL (ref 8–23)
CO2: 24 mmol/L (ref 22–32)
Calcium: 9.3 mg/dL (ref 8.9–10.3)
Chloride: 101 mmol/L (ref 98–111)
Creatinine: 0.97 mg/dL (ref 0.44–1.00)
GFR, Estimated: >60 mL/min (ref >60)
Glucose, Bld: 107 mg/dL — ABNORMAL HIGH (ref 70–99)
Potassium: 4.5 mmol/L (ref 3.5–5.1)
Sodium: 140 mmol/L (ref 135–145)
Total Bilirubin: 0.4 mg/dL (ref 0.3–1.2)
Total Protein: 7.6 g/dL (ref 6.5–8.1)

## 2021-03-10 LAB — IRON AND TIBC
Iron: 151 ug/dL — ABNORMAL HIGH (ref 41–142)
Saturation Ratios: 40 % (ref 21–57)
TIBC: 379 ug/dL (ref 236–444)
UIBC: 227 ug/dL (ref 120–384)

## 2021-03-10 LAB — FERRITIN: Ferritin: 167 ng/mL (ref 11–307)

## 2021-03-10 LAB — CEA (IN HOUSE-CHCC): CEA (CHCC-In House): 3.26 ng/mL (ref 0.00–5.00)

## 2021-03-10 MED ORDER — ALTEPLASE 2 MG IJ SOLR
2.0000 mg | Freq: Once | INTRAMUSCULAR | Status: AC | PRN
  Administered 2021-03-10: 2 mg
  Filled 2021-03-10: qty 2

## 2021-03-10 MED ORDER — SODIUM CHLORIDE 0.9% FLUSH
10.0000 mL | INTRAVENOUS | Status: DC | PRN
  Administered 2021-03-10: 10 mL
  Filled 2021-03-10: qty 10

## 2021-03-10 MED ORDER — SODIUM CHLORIDE 0.9 % IV SOLN
5.0000 mg/kg | Freq: Once | INTRAVENOUS | Status: AC
  Administered 2021-03-10: 300 mg via INTRAVENOUS
  Filled 2021-03-10: qty 12

## 2021-03-10 MED ORDER — SODIUM CHLORIDE 0.9% FLUSH
10.0000 mL | INTRAVENOUS | Status: DC | PRN
  Administered 2021-03-10: 10 mL via INTRAVENOUS
  Filled 2021-03-10: qty 10

## 2021-03-10 MED ORDER — CAPECITABINE 500 MG PO TABS: 1000.0000 mg/m2 | ORAL_TABLET | Freq: Two times a day (BID) | ORAL | 0 refills | Status: DC

## 2021-03-10 MED ORDER — SODIUM CHLORIDE 0.9 % IV SOLN
Freq: Once | INTRAVENOUS | Status: AC
  Filled 2021-03-10: qty 250

## 2021-03-10 MED ORDER — HEPARIN SOD (PORK) LOCK FLUSH 100 UNIT/ML IV SOLN
500.0000 [IU] | Freq: Once | INTRAVENOUS | Status: AC | PRN
  Administered 2021-03-10: 500 [IU]
  Filled 2021-03-10: qty 5

## 2021-03-10 MED ORDER — ALTEPLASE 2 MG IJ SOLR
INTRAMUSCULAR | Status: AC
  Filled 2021-03-10: qty 2

## 2021-03-10 NOTE — Patient Instructions (Signed)
Implanted Port Insertion, Care After This sheet gives you information about how to care for yourself after your procedure. Your health care provider may also give you more specific instructions. If you have problems or questions, contact your health care provider. What can I expect after the procedure? After the procedure, it is common to have:  Discomfort at the port insertion site.  Bruising on the skin over the port. This should improve over 3-4 days. Follow these instructions at home: Port care  After your port is placed, you will get a manufacturer's information card. The card has information about your port. Keep this card with you at all times.  Take care of the port as told by your health care provider. Ask your health care provider if you or a family member can get training for taking care of the port at home. A home health care nurse may also take care of the port.  Make sure to remember what type of port you have. Incision care  Follow instructions from your health care provider about how to take care of your port insertion site. Make sure you: ? Wash your hands with soap and water before and after you change your bandage (dressing). If soap and water are not available, use hand sanitizer. ? Change your dressing as told by your health care provider. ? Leave stitches (sutures), skin glue, or adhesive strips in place. These skin closures may need to stay in place for 2 weeks or longer. If adhesive strip edges start to loosen and curl up, you may trim the loose edges. Do not remove adhesive strips completely unless your health care provider tells you to do that.  Check your port insertion site every day for signs of infection. Check for: ? Redness, swelling, or pain. ? Fluid or blood. ? Warmth. ? Pus or a bad smell.      Activity  Return to your normal activities as told by your health care provider. Ask your health care provider what activities are safe for you.  Do not  lift anything that is heavier than 10 lb (4.5 kg), or the limit that you are told, until your health care provider says that it is safe. General instructions  Take over-the-counter and prescription medicines only as told by your health care provider.  Do not take baths, swim, or use a hot tub until your health care provider approves. Ask your health care provider if you may take showers. You may only be allowed to take sponge baths.  Do not drive for 24 hours if you were given a sedative during your procedure.  Wear a medical alert bracelet in case of an emergency. This will tell any health care providers that you have a port.  Keep all follow-up visits as told by your health care provider. This is important. Contact a health care provider if:  You cannot flush your port with saline as directed, or you cannot draw blood from the port.  You have a fever or chills.  You have redness, swelling, or pain around your port insertion site.  You have fluid or blood coming from your port insertion site.  Your port insertion site feels warm to the touch.  You have pus or a bad smell coming from the port insertion site. Get help right away if:  You have chest pain or shortness of breath.  You have bleeding from your port that you cannot control. Summary  Take care of the port as told by your   health care provider. Keep the manufacturer's information card with you at all times.  Change your dressing as told by your health care provider.  Contact a health care provider if you have a fever or chills or if you have redness, swelling, or pain around your port insertion site.  Keep all follow-up visits as told by your health care provider. This information is not intended to replace advice given to you by your health care provider. Make sure you discuss any questions you have with your health care provider. Document Revised: 06/28/2018 Document Reviewed: 06/28/2018 Elsevier Patient Education   2021 [employer]  

## 2021-03-10 NOTE — Progress Notes (Signed)
Unable to get blood return from pt's port. Cathflo was given at 1321 by Corene Cornea, [place]. Pt sent back to lab.

## 2021-03-10 NOTE — Patient Instructions (Signed)
[facility] Discharge Instructions for Patients Receiving Chemotherapy  Today you received the following chemotherapy agents Bevacizumab  To help prevent nausea and vomiting after your treatment, we encourage you to take your nausea medication as directed.    If you develop nausea and vomiting that is not controlled by your nausea medication, call the clinic.   BELOW ARE SYMPTOMS THAT SHOULD BE REPORTED IMMEDIATELY:  *FEVER GREATER THAN 100.5 F  *CHILLS WITH OR WITHOUT FEVER  NAUSEA AND VOMITING THAT IS NOT CONTROLLED WITH YOUR NAUSEA MEDICATION  *UNUSUAL SHORTNESS OF BREATH  *UNUSUAL BRUISING OR BLEEDING  TENDERNESS IN MOUTH AND THROAT WITH OR WITHOUT PRESENCE OF ULCERS  *URINARY PROBLEMS  *BOWEL PROBLEMS  UNUSUAL RASH Items with * indicate a potential emergency and should be followed up as soon as possible.  Feel free to call the clinic should you have any questions or concerns. The clinic phone number is [phone].  Please show the [facility] at check-in to the Emergency Department and triage nurse.  Bevacizumab injection What is this medicine? BEVACIZUMAB (be va SIZ yoo mab) is a monoclonal antibody. It is used to treat many types of cancer. This medicine may be used for other purposes; ask your health care provider or pharmacist if you have questions. COMMON BRAND NAME(S): Avastin, MVASI, Zirabev What should I tell my health care provider before I take this medicine? They need to know if you have any of these conditions:  diabetes  heart disease  high blood pressure  history of coughing up blood  prior anthracycline chemotherapy (e.g., doxorubicin, daunorubicin, epirubicin)  recent or ongoing radiation therapy  recent or planning to have surgery  stroke  an unusual or allergic reaction to bevacizumab, hamster proteins, mouse proteins, other medicines, foods, dyes, or preservatives  pregnant or trying to get  pregnant  breast-feeding How should I use this medicine? This medicine is for infusion into a vein. It is given by a health care professional in a hospital or clinic setting. Talk to your pediatrician regarding the use of this medicine in children. Special care may be needed. Overdosage: If you think you have taken too much of this medicine contact a poison control center or emergency room at once. NOTE: This medicine is only for you. Do not share this medicine with others. What if I miss a dose? It is important not to miss your dose. Call your doctor or health care professional if you are unable to keep an appointment. What may interact with this medicine? Interactions are not expected. This list may not describe all possible interactions. Give your health care provider a list of all the medicines, herbs, non-prescription drugs, or dietary supplements you use. Also tell them if you smoke, drink alcohol, or use illegal drugs. Some items may interact with your medicine. What should I watch for while using this medicine? Your condition will be monitored carefully while you are receiving this medicine. You will need important blood work and urine testing done while you are taking this medicine. This medicine may increase your risk to bruise or bleed. Call your doctor or health care professional if you notice any unusual bleeding. Before having surgery, talk to your health care provider to make sure it is ok. This drug can increase the risk of poor healing of your surgical site or wound. You will need to stop this drug for 28 days before surgery. After surgery, wait at least 28 days before restarting this drug. Make sure the  surgical site or wound is healed enough before restarting this drug. Talk to your health care provider if questions. Do not become pregnant while taking this medicine or for 6 months after stopping it. Women should inform their doctor if they wish to become pregnant or think they  might be pregnant. There is a potential for serious side effects to an unborn child. Talk to your health care professional or pharmacist for more information. Do not breast-feed an infant while taking this medicine and for 6 months after the last dose. This medicine has caused ovarian failure in some women. This medicine may interfere with the ability to have a child. You should talk to your doctor or health care professional if you are concerned about your fertility. What side effects may I notice from receiving this medicine? Side effects that you should report to your doctor or health care professional as soon as possible:  allergic reactions like skin rash, itching or hives, swelling of the face, lips, or tongue  chest pain or chest tightness  chills  coughing up blood  high fever  seizures  severe constipation  signs and symptoms of bleeding such as bloody or black, tarry stools; red or dark-brown urine; spitting up blood or brown material that looks like coffee grounds; red spots on the skin; unusual bruising or bleeding from the eye, gums, or nose  signs and symptoms of a blood clot such as breathing problems; chest pain; severe, sudden headache; pain, swelling, warmth in the leg  signs and symptoms of a stroke like changes in vision; confusion; trouble speaking or understanding; severe headaches; sudden numbness or weakness of the face, arm or leg; trouble walking; dizziness; loss of balance or coordination  stomach pain  sweating  swelling of legs or ankles  vomiting  weight gain Side effects that usually do not require medical attention (report to your doctor or health care professional if they continue or are bothersome):  back pain  changes in taste  decreased appetite  dry skin  nausea  tiredness This list may not describe all possible side effects. Call your doctor for medical advice about side effects. You may report side effects to FDA at  1-800-FDA-1088. Where should I keep my medicine? This drug is given in a hospital or clinic and will not be stored at home. NOTE: This sheet is a summary. It may not cover all possible information. If you have questions about this medicine, talk to your doctor, pharmacist, or health care provider.  2021 Elsevier/Gold Standard (2019-09-27 10:50:46)

## 2021-03-11 ENCOUNTER — Encounter: Payer: Self-pay | Admitting: Family Medicine

## 2021-03-11 DIAGNOSIS — C799 Secondary malignant neoplasm of unspecified site: Secondary | ICD-10-CM | POA: Insufficient documentation

## 2021-03-13 ENCOUNTER — Other Ambulatory Visit (HOSPITAL_COMMUNITY): Payer: Self-pay

## 2021-03-15 DIAGNOSIS — Z933 Colostomy status: Secondary | ICD-10-CM | POA: Diagnosis not present

## 2021-03-16 DIAGNOSIS — Z933 Colostomy status: Secondary | ICD-10-CM | POA: Diagnosis not present

## 2021-03-17 DIAGNOSIS — Z933 Colostomy status: Secondary | ICD-10-CM | POA: Diagnosis not present

## 2021-03-18 DIAGNOSIS — Z933 Colostomy status: Secondary | ICD-10-CM | POA: Diagnosis not present

## 2021-03-19 DIAGNOSIS — Z933 Colostomy status: Secondary | ICD-10-CM | POA: Diagnosis not present

## 2021-03-20 DIAGNOSIS — J9600 Acute respiratory failure, unspecified whether with hypoxia or hypercapnia: Secondary | ICD-10-CM | POA: Diagnosis not present

## 2021-03-20 DIAGNOSIS — R69 Illness, unspecified: Secondary | ICD-10-CM | POA: Diagnosis not present

## 2021-03-20 DIAGNOSIS — R1310 Dysphagia, unspecified: Secondary | ICD-10-CM | POA: Diagnosis not present

## 2021-03-20 DIAGNOSIS — C20 Malignant neoplasm of rectum: Secondary | ICD-10-CM | POA: Diagnosis not present

## 2021-03-20 DIAGNOSIS — J90 Pleural effusion, not elsewhere classified: Secondary | ICD-10-CM | POA: Diagnosis not present

## 2021-03-20 DIAGNOSIS — J449 Chronic obstructive pulmonary disease, unspecified: Secondary | ICD-10-CM | POA: Diagnosis not present

## 2021-03-20 DIAGNOSIS — Z933 Colostomy status: Secondary | ICD-10-CM | POA: Diagnosis not present

## 2021-03-20 DIAGNOSIS — E43 Unspecified severe protein-calorie malnutrition: Secondary | ICD-10-CM | POA: Diagnosis not present

## 2021-03-20 DIAGNOSIS — M6281 Muscle weakness (generalized): Secondary | ICD-10-CM | POA: Diagnosis not present

## 2021-03-20 DIAGNOSIS — I82621 Acute embolism and thrombosis of deep veins of right upper extremity: Secondary | ICD-10-CM | POA: Diagnosis not present

## 2021-03-20 NOTE — Progress Notes (Signed)
[place]   Telephone:([phone]) [phone] [phone]   Clinic Follow up Note   Patient Care Team: [name], DO as PCP - General (Family Medicine) Jonnie Finner, RN as Oncology Nurse Navigator [name], MD as Consulting Physician (Hematology) Icard, Octavio Graves, DO as Consulting Physician (Pulmonary Disease) Pe Ell, P.A. [name], MD as Consulting Physician (General Surgery) [name], MD as Consulting Physician (General Surgery) [name], [name], MD as Consulting Physician (Gastroenterology) 03/24/2021  CHIEF COMPLAINT: Follow up rectal cancer   SUMMARY OF ONCOLOGIC HISTORY: Oncology History Overview Note  Cancer Staging Rectal cancer [facility]) Staging form: Colon and Rectum, AJCC 8th Edition - Pathologic stage from 03/03/2020: Stage IIIB (pT4a, pN1b, cM0) - Signed by [name], MD on 01/28/2021 Stage prefix: Initial diagnosis Histologic grading system: 4 grade system Histologic grade (G): G2 Residual tumor (R): R0 - None    Rectal cancer ([place])  03/03/2020 - 04/05/2020 Hospital Admission   She was admitted to ED on 03/03/20 for abdominal pain and nausea. She had been having diarrhea for several weeks. During hospital stay she developed acute respiratory failure, AKI, RUE DVT from PICC line. Work up showed bowel perforation, small left liver mass and thickening of jejunum. She underwent emergent surgery on 03/03/20 for resection and colostomy placement. Her path showed invasive cancer, metastatic to 3/15 LNs. She had a NGT in placed but this was removed on POD 3. Post op her stoma became necrotic and septic. She had another bowel surgery on 03/12/20, which was NED with necrotic tissue.    03/03/2020 Imaging   CT AP 03/03/20  IMPRESSION: 1. Free intraperitoneal air consistent perforation. Most likely site of perforation is in the distal splenic flexure/proximal descending colon where there is a collection of air and gas measuring  6 centimeters. No obvious soft tissue mass identified in this region. At this site, there is abrupt transition of dilated, stool-filled colon to completely decompressed proximal descending colon. 2. Thickened, inflamed loops of jejunum are identified within the pelvis and are likely reactive. 3. Small hiatal hernia. 4. Benign-appearing 1.6 centimeter mass the LEFT hepatic lobe. Recommend comparison with prior studies if available. 5.  Emphysema (ICD10-J43.9). 6.  Aortic Atherosclerosis (ICD10-I70.0). 7. Bilateral renal scarring.   03/03/2020 Surgery   low anterior resection end colostomy by Dr Marcello Moores    03/03/2020 Initial Biopsy   FINAL MICROSCOPIC DIAGNOSIS: 03/03/20 A. RECTOSIGMOID COLON, LOW ANTERIOR RESECTION:  - Invasive colonic adenocarcinoma, 3.5 cm.  - Tumor invades the visceral peritoneum.  - Margins of resection are not involved.  - Metastatic carcinoma in (3) of (15) lymph nodes.  - See oncology table.   B. ADDITIONAL SIGMOID COLON, RESECTION:  - Colonic tissue, negative for carcinoma.  ADDENDUM:  Mismatch Repair Protein (IHC)   SUMMARY INTERPRETATION: NORMAL  There is preserved expression of the major MMR proteins. There is a very  low probability that microsatellite instability (MSI) is present.  However, certain clinically significant MMR protein mutations may result  in preservation of nuclear expression. It is recommended that the  preservation of protein expression be correlated with molecular based  MSI testing.   IHC EXPRESSION RESULTS  TEST           RESULT  MLH1:          Preserved nuclear expression  MSH2:          Preserved nuclear expression  MSH6:          Preserved nuclear expression  PMS2:  Preserved nuclear expression   03/03/2020 Cancer Staging   Staging form: Colon and Rectum, AJCC 8th Edition - Pathologic stage from 03/03/2020: Stage IIIB (pT4a, pN1b, cM0) - Signed by [name], MD on 01/28/2021 Stage prefix: Initial  diagnosis Histologic grading system: 4 grade system Histologic grade (G): G2 Residual tumor (R): R0 - None   03/08/2020 Imaging   CT AP 03/08/20 IMPRESSION: 1. Interval midline laparotomy with distal colon resection and diverting left lower quadrant colostomy. 2. Diffuse small bowel dilatation with gas fluid levels most consistent with postoperative ileus. 3. Trace ascites within the abdomen and pelvis. No fluid collection or abscess at this time. Surgical drain within the lower pelvis. 4. Indeterminate 1.4 cm subcapsular liver hypodensity. In light of newly diagnosed rectal cancer, metastatic disease cannot be excluded. PET CT may be useful for further evaluation. 5. Interval development of trace bilateral pleural effusions and diffuse body wall edema.   03/12/2020 Surgery   EXPLORATORY LAPAROTOMY WITH BOWEL RESECTION AND COLOSTOMY by Dr Hassell Done 03/12/20  FINAL MICROSCOPIC DIAGNOSIS: 03/12/20  A. COLON, SPLENIC FLEXURE, RESECTION:  - Segment of colon (37 cm) with perforation and associated inflammation  - Multiple mucosal ulcers with necrotizing inflammation  - No evidence of malignancy    03/12/2020 Imaging   CT AP WO contrast 03/12/20  IMPRESSION: 1. Exam is limited by lack of intravenous contrast, motion, and artifact from the patient's arms adjacent to the torso. Since 03/08/2020, there has been development of relatively large pockets of intraperitoneal free air. While intraperitoneal gas would not be unexpected on postoperative day 9, this gas is new since an intervening study of 03/08/2020 and given the relatively large volume certainly raises concern for bowel perforation. No source for the intraperitoneal free air is evident on this study. There is a surgical drain in the pelvis, but there is no free gas around the drain itself to suggest that it represents the source. 2. New circumferential wall thickening in the splenic flexure, descending colon and sigmoid colon  leading into the end colostomy. Infection/inflammation would be a consideration. Ischemia cannot be excluded. 3. Relatively small volume intraperitoneal free fluid. High attenuation small fluid collections in the left upper abdomen may reflect hemorrhage, infection or residua from prior perforation. 4. Interval progression of diffuse body wall edema. 5. Residual contrast material in the renal parenchyma from prior imaging, compatible with renal dysfunction.    03/19/2020 Imaging   CT CAP 03/19/20  IMPRESSION: 1. 5.5 x 3.2 cm fluid collection is noted along the greater curvature of the proximal stomach. 2. Surgical drain is again noted in the pelvis with tip in left lower quadrant. 3. Interval development of crescent-shaped fluid collection measuring 16.4 x 3.3 cm in the epigastric region and left upper quadrant of the abdomen which may extend into the left lower quadrant. Potentially this may represent abscess or developing abscess. Multiple other smaller fluid collections are noted which may represent small abscesses. 4. 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 5. Mild amount of free fluid is noted in the posterior pelvis. 6. Moderate anasarca is noted. Aortic Atherosclerosis (ICD10-I70.0).   04/12/2020 Imaging   CT AP 04/12/20  IMPRESSION: 1. Fluid collections within the LEFT abdomen have significantly decreased compared to previous CT exams, now nearly completely resolved. 2. Percutaneous drainage catheter with tip coiled posterior to the LEFT kidney, stable positioning compared to the previous study. The more anterior catheter has been removed. 3. Small amount of free fluid persists within the abdomen and pelvis. 4.  Trace bibasilar pleural effusions. 5. Anasarca. 6. While reviewing today's study, comparing with a chest/abdomen/pelvis CT from earlier same month, there is question of thrombus in the LEFT internal jugular vein. Recommend ultrasound of the LEFT IJ to  exclude DVT. This recommendation discussed with patient's hospitalist, Dr. Owens Shark, on 04/12/2020 at 4:20 p.m.   Emphysema (ICD10-J43.9).   04/22/2020 Imaging   CT AP 04/22/20  IMPRESSION: 1. No recurrent intra-abdominal abscess status post interval removal of left retroperitoneal percutaneous drain. Stable small amount of free pelvic fluid. 2. Enlarging dependent bilateral pleural effusions, now moderate in volume. Associated atelectasis at both lung bases. 3. Progressive anasarca with generalized edema throughout the subcutaneous fat. 4. Stable small subcapsular fluid collection along the anterior aspect of the left hepatic lobe. 5. Aortic Atherosclerosis (ICD10-I70.0).   05/23/2020 Initial Diagnosis   Rectal cancer ([place])   01/14/2021 Imaging   CT CAP IMPRESSION: -5.0 cm soft tissue mass along the posterior aspect of the cecum at the base of the appendix, favored to reflect a peritoneal/serosal implant, corresponding to the patient's known adenocarcinoma. This is new from the prior. -Suspected additional pelvic implants along the uterus and right adnexa, poorly visualized, new/progressive from the prior. -Additional pericapsular lesion along the lateral spleen is mildly progressive, suspicious for additional peritoneal implant. -No evidence of metastatic disease in the chest.   01/27/2021 PET scan   IMPRESSION:  1. Extensive hypermetabolic peritoneal metastasis, as detailed  above.  2. No evidence of hypermetabolic supradiaphragmatic disease.  3. Diffuse low-level thyroid hypermetabolism can be seen in the  setting of thyroiditis. Consider correlation with thyroid function  labs.  4. Aortic atherosclerosis (ICD10-I70.0) and emphysema (ICD10-J43.9).    02/01/2021 -  Chemotherapy   First line chemo Xeloda 1500 mg twice daily for day 1-14, every 21 days, started on 02/01/2021. Reduced to 1 week on/ 1 week off and added bevacizumab q2weeks from C2 on 02/24/21. 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 - Polypoid lesion obliterating appendiceal orifice. Biopsied to evaluate for malignancy. - The examined portion of the ileum was normal. - Two large polyps in the cecum, not removed today pending path at appendiceal orifice, bowel prep. If removed endoscopically in the future would favor EMR to be done at the hospital. - One 5 mm polyp at the ileocecal valve, removed with a cold snare. Resected and retrieved. - One 3 mm polyp in the transverse colon, removed with a cold snare. Resected and retrieved. - Parastomal hernia making initial entry to the distal colon challenging. - The examination was otherwise normal.   01/06/2021 Initial Biopsy   Diagnosis 1. Colon, biopsy, appendiceal oraface - ADENOCARCINOMA. 2. Colon, polyp(s), ileocecal valve, transverse, x2 - TUBULAR ADENOMA, NEGATIVE FOR HIGH GRADE DYSPLASIA (X1). - COLONIC MUCOSA WITH UNDERLYING LYMPHOID AGGREGATE, NEGATIVE FOR DYSPLASIA (X1).  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 This is new from  the prior. -Suspected additional pelvic implants along the uterus and right adnexa, poorly visualized, new/progressive from the prior. -Additional pericapsular lesion along the lateral spleen is mildly progressive, suspicious for additional peritoneal implant. -No evidence of metastatic  disease in the chest.     CURRENT THERAPY: First line chemoXeloda1500 mg twice daily for day 1-14, every 21 days, started on 02/01/2021. Reduced to 1 week on/ 1 week off and added bevacizumab q2weeks from C2 on 02/24/21.   INTERVAL HISTORY: Tracy [name] returns for follow up as scheduled.  Completed cycle 3 Xeloda and Avastin. The plan is to add oxaliplatin today with cycle 4, she started Xeloda this morning.  She tolerates oral chemo well, no mucositis or hand-foot syndrome. Stools are more liquid on chemo, empties bag 2-3 times per day.  Energy and appetite are adequate, she "felt so good I forgot I was sick."  Denies fever, chills, cough, chest pain, dyspnea, bleeding, pain, neuropathy, or other new concerns.   MEDICAL HISTORY:  Past Medical History:  Diagnosis Date  . Acute deep vein thrombosis (DVT) of brachial vein of right upper extremity ([place])   . Acute on chronic respiratory failure with hypoxia ([place])   . Asthma   . Cataract   . CHICKENPOX, HX OF 01/06/2011   Qualifier: Diagnosis of  By: Charlett Blake MD, Erline Levine    . COPD (chronic obstructive pulmonary disease) ([place]) 2011   FeV1 31% predicted FeV1/FVX 47 %  . Essential hypertension 05/23/2020  . Hemorrhoid   . Open right radial fracture 2020  . Osteopenia   . Perforated sigmoid colon ([place]) 03/03/2020  . Pleural effusion   . Pneumonia 2021  . Postoperative intra-abdominal abscess 2021  . Rectal cancer ([place])   . Seasonal allergies    takes Claritin daily prn  . Shock circulatory ([place])   . Tracheostomy status ([place])   . Vitamin D deficiency    takes Vit d every 14 days    SURGICAL HISTORY: Past Surgical History:  Procedure Laterality Date  . AUGMENTATION MAMMAPLASTY     saline  . EXAMINATION UNDER ANESTHESIA  10/14/2012   Procedure: EXAM UNDER ANESTHESIA;  Surgeon: Gayland Curry, MD,FACS;  Location: [facility];  Service: General;  Laterality: N/A;  rectal exam under anesthesia excisional hemorrhoidectomy hemorrhoidal banding x two  .  EXAMINATION UNDER ANESTHESIA  02/03/2013   excision hemorrhoidal tissue  . FOOT SURGERY     left bunionectomy  . HEMORRHOIDECTOMY WITH HEMORRHOID BANDING  10/14/2012   Procedure: HEMORRHOIDECTOMY WITH HEMORRHOID BANDING;  Surgeon: Gayland Curry, MD,FACS;  Location: [facility];  Service: General;  Laterality: N/A;  . IR IMAGING GUIDED PORT INSERTION  02/03/2021  . IR THORACENTESIS ASP PLEURAL SPACE W/IMG GUIDE  04/23/2020  . LAPAROTOMY N/A 03/03/2020   Procedure: low anterior resection end colostomy;  Surgeon: [name], MD;  Location: [facility];  Service: General;  Laterality: N/A;  . LAPAROTOMY N/A 03/12/2020   Procedure: EXPLORATORY LAPAROTOMY WITH BOWEL RESECTION AND COLOSTOMY;  Surgeon: [name], MD;  Location: [facility];  Service: General;  Laterality: N/A;  . OPEN REDUCTION INTERNAL FIXATION (ORIF) DISTAL RADIAL FRACTURE Right 11/01/2018   Procedure: OPEN REDUCTION INTERNAL FIXATION (ORIF) DISTAL RADIAL FRACTURE;  Surgeon: [name], MD;  Location: [facility];  Service: Orthopedics;  Laterality: Right;  . TONSILLECTOMY  1960   recurrent otitis media  . US ECHOCARDIOGRAPHY  02/2020    poor windows, normal LV function, severely dilated RV with moderately reduced function, RV volume and pressure  overload, mildly dilated RA    I have reviewed the social history and family history with the patient and they are unchanged from previous note.  ALLERGIES:  is allergic to amlodipine and codeine.  MEDICATIONS:  Current Outpatient Medications  Medication Sig Dispense Refill  . albuterol (PROVENTIL) (2.5 MG/3ML) 0.083% nebulizer solution Take 3 mLs (2.5 mg total) by nebulization every 4 (four) hours as needed for wheezing or shortness of breath. DX: J44.9 360 mL 5  . albuterol (VENTOLIN HFA) 108 (90 Base) MCG/ACT inhaler Inhale 2 puffs into the lungs every 6 (six) hours as needed for wheezing or shortness of breath. 8 g 5  . ALPRAZolam (XANAX) 0.25 MG tablet Take 1 tablet (0.25 mg  total) by mouth at bedtime as needed for anxiety. 30 tablet 0  . capecitabine (XELODA) 500 MG tablet Take 3 tablets (1,500 mg total) by mouth 2 (two) times daily after a meal. Take for 7 days on, 7 days off, repeat every 14 days. 84 tablet 0  . diphenhydrAMINE (BENADRYL) 25 mg capsule Take 25 mg by mouth every 6 (six) hours as needed.    . docusate sodium (COLACE) 50 MG capsule Take 50 mg by mouth daily as needed for mild constipation.    . fluticasone (FLONASE) 50 MCG/ACT nasal spray Place 2 sprays into both nostrils daily. 16 mL 5  . Fluticasone-Umeclidin-Vilant (TRELEGY ELLIPTA) 100-62.5-25 MCG/INH AEPB Inhale 1 puff into the lungs daily. 60 each 5  . lidocaine-prilocaine (EMLA) cream Apply 1 application topically as needed. 30 g 0  . ondansetron (ZOFRAN) 8 MG tablet Take 1 tablet (8 mg total) by mouth every 8 (eight) hours as needed for nausea or vomiting. 20 tablet 2  . traMADol (ULTRAM) 50 MG tablet Take 1 tablet (50 mg total) by mouth every 6 (six) hours as needed. 30 tablet 0   No current facility-administered medications for this visit.    PHYSICAL EXAMINATION: ECOG PERFORMANCE STATUS: 0 - Asymptomatic  Vitals:   03/24/21 1009  BP: 117/67  Pulse: 79  Resp: 17  Temp: (!) 96 F (35.6 C)  SpO2: 98%   Filed Weights   03/24/21 1009  Weight: 132 lb 12.8 oz (60.2 kg)    GENERAL:alert, no distress and comfortable SKIN: Palms without erythema.  2 round slightly raised patchy skin eruptions to the left inner and posterior thigh EYES: sclera clear OROPHARYNX: No thrush or ulcers LUNGS:  normal breathing effort HEART: no lower extremity edema NEURO: alert & oriented x 3 with fluent speech, no focal motor/sensory deficits PAC without erythema  LABORATORY DATA:  I have reviewed the data as listed CBC Latest Ref Rng & Units 03/24/2021 03/10/2021 02/24/2021  WBC 4.0 - 10.5 K/uL 6.8 9.8 -  Hemoglobin 12.0 - 15.0 g/dL 13.0 13.4 -  Hematocrit 36.0 - 46.0 % 38.1 40.7 -  Platelets 150 -  400 K/uL 296 369 304     CMP Latest Ref Rng & Units 03/24/2021 03/10/2021 02/24/2021  Glucose 70 - 99 mg/dL 90 107(H) 92  BUN 8 - 23 mg/dL 14 22 25(H)  Creatinine 0.44 - 1.00 mg/dL 1.03(H) 0.97 0.91  Sodium 135 - 145 mmol/L 138 140 137  Potassium 3.5 - 5.1 mmol/L 4.4 4.5 4.2  Chloride 98 - 111 mmol/L 103 101 105  CO2 22 - 32 mmol/L 23 24 24   Calcium 8.9 - 10.3 mg/dL 9.0 9.3 9.2  Total Protein 6.5 - 8.1 g/dL 7.2 7.6 6.8  Total Bilirubin 0.3 - 1.2 mg/dL 0.6 0.4 0.5  Alkaline Phos 38 - 126 U/L 102 110 81  AST 15 - 41 U/L 29 26 25   ALT 0 - 44 U/L 16 18 17       RADIOGRAPHIC STUDIES: I have personally reviewed the radiological images as listed and agreed with the findings in the report. No results found.   ASSESSMENT & PLAN: 70 yo female   1. [name], MMR normal, peritoneal metastasis in 01/2021 -diagnosed 02/2020, due to perforation underwent emergent surgery. Path showed invasive adenocarcinoma with perforation, +3/15 LNs, and clear margins. Initial CT scan negative for distant metastasis.  -due to bowel perforation and + LNs she has very high risk for recurrence, unfortunately she was not a candidate for adjuvant chemo due to very slow recovery from surgery -Guardant Reveal ctDNA were all negative x3 (06/06/20, 07/12/20, and 09/10/20).  -surveillance CT 08/27/20 showed a small density adjacent to the spleen that had decreased, no other evidence of recurrent or metastatic disease  -first surveillance colonoscopy by Dr. Havery Moros 01/06/21 showed new adenocarcinoma at appendiceal orifice and other tubular adenomas throughout the colon.  Biopsy confirmed adenocarcinoma -01/27/2021 PET showed tumor in appendiceal orifice is likely peritoneal metastasis growing into the cecum.  Her peritoneal implants are mainly in the pelvis, difficult to biopsy, Dr. Burr Medico did not feel strongly that she needed a biopsy to confirm -Dr. Burr Medico recommended first-line Capox, she  began single agent Xeloda with cycle 1 on 2/19.  Due to low performance status with cycle 2 was changed to 1 week on/1 week off, and beva was added. She tolerates this well  -adding low dose oxaliplatin with C4  2. H/o provoked RUE DVT 04/04/20 s/p 3 months AC (Eliquis)  3. Comorbidities: Asthma, COPD, anxiety -on nebulizer and trelegy, has not needed rescue inhaler recently  -f/up pulmonology and PCP     Disposition: Tracy [name] appears stable.  She completed 3 cycles of Xeloda (1 week on/1 week off) and continues q2 week bevacizumab.  She tolerates treatment well without significant toxicities.  She is able to recover and function well.  Labs reviewed, adequate to proceed with cycle 4 Xeloda 1500 mg twice daily 1 week on/1 week off, continue bevacizumab 5 mg/kg, and add low-dose oxaliplatin today as planned.  We reviewed the rationale, potential side effects, symptom management, and goal of adding oxaliplatin.  Her CEA normalized after initiating therapy, level is pending from today.  She feels well overall.  This is consistent with a treatment response.  Likely restage after 6 cycles of treatment.  All questions were answered. The patient knows to call the clinic with any problems, questions or concerns. No barriers to learning were detected.     Alla Feeling, NP 03/24/21

## 2021-03-21 DIAGNOSIS — Z933 Colostomy status: Secondary | ICD-10-CM | POA: Diagnosis not present

## 2021-03-22 DIAGNOSIS — Z933 Colostomy status: Secondary | ICD-10-CM | POA: Diagnosis not present

## 2021-03-23 DIAGNOSIS — Z933 Colostomy status: Secondary | ICD-10-CM | POA: Diagnosis not present

## 2021-03-24 ENCOUNTER — Other Ambulatory Visit: Payer: Self-pay

## 2021-03-24 ENCOUNTER — Inpatient Hospital Stay: Payer: Medicare HMO | Admitting: Nurse Practitioner

## 2021-03-24 ENCOUNTER — Encounter: Payer: Self-pay | Admitting: Nurse Practitioner

## 2021-03-24 ENCOUNTER — Inpatient Hospital Stay: Payer: Medicare HMO

## 2021-03-24 ENCOUNTER — Inpatient Hospital Stay: Payer: Medicare HMO | Attending: Hematology

## 2021-03-24 VITALS — BP 117/67 | HR 79 | Temp 96.0°F | Resp 17 | Ht 62.0 in | Wt 132.8 lb

## 2021-03-24 DIAGNOSIS — C20 Malignant neoplasm of rectum: Secondary | ICD-10-CM

## 2021-03-24 DIAGNOSIS — J449 Chronic obstructive pulmonary disease, unspecified: Secondary | ICD-10-CM | POA: Insufficient documentation

## 2021-03-24 DIAGNOSIS — Z79899 Other long term (current) drug therapy: Secondary | ICD-10-CM | POA: Insufficient documentation

## 2021-03-24 DIAGNOSIS — Z933 Colostomy status: Secondary | ICD-10-CM | POA: Insufficient documentation

## 2021-03-24 DIAGNOSIS — C786 Secondary malignant neoplasm of retroperitoneum and peritoneum: Secondary | ICD-10-CM | POA: Insufficient documentation

## 2021-03-24 DIAGNOSIS — Z5189 Encounter for other specified aftercare: Secondary | ICD-10-CM | POA: Diagnosis not present

## 2021-03-24 DIAGNOSIS — Z5112 Encounter for antineoplastic immunotherapy: Secondary | ICD-10-CM | POA: Diagnosis not present

## 2021-03-24 DIAGNOSIS — Z95828 Presence of other vascular implants and grafts: Secondary | ICD-10-CM

## 2021-03-24 DIAGNOSIS — C19 Malignant neoplasm of rectosigmoid junction: Secondary | ICD-10-CM | POA: Diagnosis not present

## 2021-03-24 LAB — IRON AND TIBC
Iron: 134 ug/dL (ref 41–142)
Saturation Ratios: 34 % (ref 21–57)
TIBC: 389 ug/dL (ref 236–444)
UIBC: 255 ug/dL (ref 120–384)

## 2021-03-24 LAB — CMP (CANCER CENTER ONLY)
ALT: 16 U/L (ref 0–44)
AST: 29 U/L (ref 15–41)
Albumin: 4.2 g/dL (ref 3.5–5.0)
Alkaline Phosphatase: 102 U/L (ref 38–126)
Anion gap: 12 (ref 5–15)
BUN: 14 mg/dL (ref 8–23)
CO2: 23 mmol/L (ref 22–32)
Calcium: 9 mg/dL (ref 8.9–10.3)
Chloride: 103 mmol/L (ref 98–111)
Creatinine: 1.03 mg/dL — ABNORMAL HIGH (ref 0.44–1.00)
GFR, Estimated: 60 mL/min — ABNORMAL LOW (ref >60)
Glucose, Bld: 90 mg/dL (ref 70–99)
Potassium: 4.4 mmol/L (ref 3.5–5.1)
Sodium: 138 mmol/L (ref 135–145)
Total Bilirubin: 0.6 mg/dL (ref 0.3–1.2)
Total Protein: 7.2 g/dL (ref 6.5–8.1)

## 2021-03-24 LAB — CBC WITH DIFFERENTIAL (CANCER CENTER ONLY)
Abs Immature Granulocytes: 0.02 10*3/uL (ref 0.00–0.07)
Basophils Absolute: 0 10*3/uL (ref 0.0–0.1)
Basophils Relative: 0 %
Eosinophils Absolute: 0.1 10*3/uL (ref 0.0–0.5)
Eosinophils Relative: 1 %
HCT: 38.1 % (ref 36.0–46.0)
Hemoglobin: 13 g/dL (ref 12.0–15.0)
Immature Granulocytes: 0 %
Lymphocytes Relative: 27 %
Lymphs Abs: 1.8 10*3/uL (ref 0.7–4.0)
MCH: 33.9 pg (ref 26.0–34.0)
MCHC: 34.1 g/dL (ref 30.0–36.0)
MCV: 99.5 fL (ref 80.0–100.0)
Monocytes Absolute: 0.7 10*3/uL (ref 0.1–1.0)
Monocytes Relative: 10 %
Neutro Abs: 4.2 10*3/uL (ref 1.7–7.7)
Neutrophils Relative %: 62 %
Platelet Count: 296 10*3/uL (ref 150–400)
RBC: 3.83 MIL/uL — ABNORMAL LOW (ref 3.87–5.11)
RDW: 15.3 % (ref 11.5–15.5)
WBC Count: 6.8 10*3/uL (ref 4.0–10.5)
nRBC: 0 % (ref 0.0–0.2)

## 2021-03-24 LAB — FERRITIN: Ferritin: 91 ng/mL (ref 11–307)

## 2021-03-24 LAB — CEA (IN HOUSE-CHCC): CEA (CHCC-In House): 2.81 ng/mL (ref 0.00–5.00)

## 2021-03-24 LAB — TOTAL PROTEIN, URINE DIPSTICK: Protein, ur: NEGATIVE mg/dL

## 2021-03-24 MED ORDER — PALONOSETRON HCL INJECTION 0.25 MG/5ML
0.2500 mg | Freq: Once | INTRAVENOUS | Status: AC
  Administered 2021-03-24: 0.25 mg via INTRAVENOUS

## 2021-03-24 MED ORDER — DEXTROSE 5 % IV SOLN
Freq: Once | INTRAVENOUS | Status: AC
  Filled 2021-03-24: qty 250

## 2021-03-24 MED ORDER — SODIUM CHLORIDE 0.9% FLUSH
10.0000 mL | Freq: Once | INTRAVENOUS | Status: AC
  Administered 2021-03-24: 10 mL via INTRAVENOUS
  Filled 2021-03-24: qty 10

## 2021-03-24 MED ORDER — HEPARIN SOD (PORK) LOCK FLUSH 100 UNIT/ML IV SOLN
500.0000 [IU] | Freq: Once | INTRAVENOUS | Status: AC | PRN
  Administered 2021-03-24: 500 [IU]
  Filled 2021-03-24: qty 5

## 2021-03-24 MED ORDER — SODIUM CHLORIDE 0.9 % IV SOLN
10.0000 mg | Freq: Once | INTRAVENOUS | Status: AC
  Administered 2021-03-24: 10 mg via INTRAVENOUS
  Filled 2021-03-24: qty 10

## 2021-03-24 MED ORDER — OXALIPLATIN CHEMO INJECTION 100 MG/20ML
50.0000 mg/m2 | Freq: Once | INTRAVENOUS | Status: AC
  Administered 2021-03-24: 80 mg via INTRAVENOUS
  Filled 2021-03-24: qty 16

## 2021-03-24 MED ORDER — SODIUM CHLORIDE 0.9% FLUSH
10.0000 mL | INTRAVENOUS | Status: DC | PRN
  Administered 2021-03-24: 10 mL
  Filled 2021-03-24: qty 10

## 2021-03-24 MED ORDER — SODIUM CHLORIDE 0.9 % IV SOLN
5.0000 mg/kg | Freq: Once | INTRAVENOUS | Status: AC
  Administered 2021-03-24: 300 mg via INTRAVENOUS
  Filled 2021-03-24: qty 12

## 2021-03-24 MED ORDER — PALONOSETRON HCL INJECTION 0.25 MG/5ML
INTRAVENOUS | Status: AC
  Filled 2021-03-24: qty 5

## 2021-03-24 MED ORDER — SODIUM CHLORIDE 0.9 % IV SOLN
Freq: Once | INTRAVENOUS | Status: AC
  Filled 2021-03-24: qty 250

## 2021-03-24 NOTE — Patient Instructions (Signed)

## 2021-03-24 NOTE — Patient Instructions (Signed)
[facility] Discharge Instructions for Patients Receiving Chemotherapy  Today you received the following chemotherapy agents:  Bevacizumab and Oxaliplatin  To help prevent nausea and vomiting after your treatment, we encourage you to take your nausea medication as directed.    If you develop nausea and vomiting that is not controlled by your nausea medication, call the clinic.   BELOW ARE SYMPTOMS THAT SHOULD BE REPORTED IMMEDIATELY:  *FEVER GREATER THAN 100.5 F  *CHILLS WITH OR WITHOUT FEVER  NAUSEA AND VOMITING THAT IS NOT CONTROLLED WITH YOUR NAUSEA MEDICATION  *UNUSUAL SHORTNESS OF BREATH  *UNUSUAL BRUISING OR BLEEDING  TENDERNESS IN MOUTH AND THROAT WITH OR WITHOUT PRESENCE OF ULCERS  *URINARY PROBLEMS  *BOWEL PROBLEMS  UNUSUAL RASH Items with * indicate a potential emergency and should be followed up as soon as possible.  Feel free to call the clinic should you have any questions or concerns. The clinic phone number is [phone].  Please show the [place] at check-in to the Emergency Department and triage nurse.  Bevacizumab injection What is this medicine? BEVACIZUMAB (be va SIZ yoo mab) is a monoclonal antibody. It is used to treat many types of cancer. This medicine may be used for other purposes; ask your health care provider or pharmacist if you have questions. COMMON BRAND NAME(S): Avastin, MVASI, Zirabev What should I tell my health care provider before I take this medicine? They need to know if you have any of these conditions:  diabetes  heart disease  high blood pressure  history of coughing up blood  prior anthracycline chemotherapy (e.g., doxorubicin, daunorubicin, epirubicin)  recent or ongoing radiation therapy  recent or planning to have surgery  stroke  an unusual or allergic reaction to bevacizumab, hamster proteins, mouse proteins, other medicines, foods, dyes, or preservatives  pregnant or trying to get  pregnant  breast-feeding How should I use this medicine? This medicine is for infusion into a vein. It is given by a health care professional in a hospital or clinic setting. Talk to your pediatrician regarding the use of this medicine in children. Special care may be needed. Overdosage: If you think you have taken too much of this medicine contact a poison control center or emergency room at once. NOTE: This medicine is only for you. Do not share this medicine with others. What if I miss a dose? It is important not to miss your dose. Call your doctor or health care professional if you are unable to keep an appointment. What may interact with this medicine? Interactions are not expected. This list may not describe all possible interactions. Give your health care provider a list of all the medicines, herbs, non-prescription drugs, or dietary supplements you use. Also tell them if you smoke, drink alcohol, or use illegal drugs. Some items may interact with your medicine. What should I watch for while using this medicine? Your condition will be monitored carefully while you are receiving this medicine. You will need important blood work and urine testing done while you are taking this medicine. This medicine may increase your risk to bruise or bleed. Call your doctor or health care professional if you notice any unusual bleeding. Before having surgery, talk to your health care provider to make sure it is ok. This drug can increase the risk of poor healing of your surgical site or wound. You will need to stop this drug for 28 days before surgery. After surgery, wait at least 28 days before restarting this drug.  Make sure the surgical site or wound is healed enough before restarting this drug. Talk to your health care provider if questions. Do not become pregnant while taking this medicine or for 6 months after stopping it. Women should inform their doctor if they wish to become pregnant or think they  might be pregnant. There is a potential for serious side effects to an unborn child. Talk to your health care professional or pharmacist for more information. Do not breast-feed an infant while taking this medicine and for 6 months after the last dose. This medicine has caused ovarian failure in some women. This medicine may interfere with the ability to have a child. You should talk to your doctor or health care professional if you are concerned about your fertility. What side effects may I notice from receiving this medicine? Side effects that you should report to your doctor or health care professional as soon as possible:  allergic reactions like skin rash, itching or hives, swelling of the face, lips, or tongue  chest pain or chest tightness  chills  coughing up blood  high fever  seizures  severe constipation  signs and symptoms of bleeding such as bloody or black, tarry stools; red or dark-brown urine; spitting up blood or brown material that looks like coffee grounds; red spots on the skin; unusual bruising or bleeding from the eye, gums, or nose  signs and symptoms of a blood clot such as breathing problems; chest pain; severe, sudden headache; pain, swelling, warmth in the leg  signs and symptoms of a stroke like changes in vision; confusion; trouble speaking or understanding; severe headaches; sudden numbness or weakness of the face, arm or leg; trouble walking; dizziness; loss of balance or coordination  stomach pain  sweating  swelling of legs or ankles  vomiting  weight gain Side effects that usually do not require medical attention (report to your doctor or health care professional if they continue or are bothersome):  back pain  changes in taste  decreased appetite  dry skin  nausea  tiredness This list may not describe all possible side effects. Call your doctor for medical advice about side effects. You may report side effects to FDA at  1-800-FDA-1088. Where should I keep my medicine? This drug is given in a hospital or clinic and will not be stored at home. NOTE: This sheet is a summary. It may not cover all possible information. If you have questions about this medicine, talk to your doctor, pharmacist, or health care provider.  2021 [employer]vier/Gold Standard (2019-09-27 10:50:46)  Oxaliplatin Injection What is this medicine? OXALIPLATIN (ox AL i PLA tin) is a chemotherapy drug. It targets fast dividing cells, like cancer cells, and causes these cells to die. This medicine is used to treat cancers of the colon and rectum, and many other cancers. This medicine may be used for other purposes; ask your health care provider or pharmacist if you have questions. COMMON BRAND NAME(S): Eloxatin What should I tell my health care provider before I take this medicine? They need to know if you have any of these conditions:  heart disease  history of irregular heartbeat  liver disease  low blood counts, like white cells, platelets, or red blood cells  lung or breathing disease, like asthma  take medicines that treat or prevent blood clots  tingling of the fingers or toes, or other nerve disorder  an unusual or allergic reaction to oxaliplatin, other chemotherapy, other medicines, foods, dyes, or preservatives  pregnant  or trying to get pregnant  breast-feeding How should I use this medicine? This drug is given as an infusion into a vein. It is administered in a hospital or clinic by a specially trained health care professional. Talk to your pediatrician regarding the use of this medicine in children. Special care may be needed. Overdosage: If you think you have taken too much of this medicine contact a poison control center or emergency room at once. NOTE: This medicine is only for you. Do not share this medicine with others. What if I miss a dose? It is important not to miss a dose. Call your doctor or health care  professional if you are unable to keep an appointment. What may interact with this medicine? Do not take this medicine with any of the following medications:  cisapride  dronedarone  pimozide  thioridazine This medicine may also interact with the following medications:  aspirin and aspirin-like medicines  certain medicines that treat or prevent blood clots like warfarin, apixaban, dabigatran, and rivaroxaban  cisplatin  cyclosporine  diuretics  medicines for infection like acyclovir, adefovir, amphotericin B, bacitracin, cidofovir, foscarnet, ganciclovir, gentamicin, pentamidine, vancomycin  NSAIDs, medicines for pain and inflammation, like ibuprofen or naproxen  other medicines that prolong the QT interval (an abnormal heart rhythm)  pamidronate  zoledronic acid This list may not describe all possible interactions. Give your health care provider a list of all the medicines, herbs, non-prescription drugs, or dietary supplements you use. Also tell them if you smoke, drink alcohol, or use illegal drugs. Some items may interact with your medicine. What should I watch for while using this medicine? Your condition will be monitored carefully while you are receiving this medicine. You may need blood work done while you are taking this medicine. This medicine may make you feel generally unwell. This is not uncommon as chemotherapy can affect healthy cells as well as cancer cells. Report any side effects. Continue your course of treatment even though you feel ill unless your healthcare professional tells you to stop. This medicine can make you more sensitive to cold. Do not drink cold drinks or use ice. Cover exposed skin before coming in contact with cold temperatures or cold objects. When out in cold weather wear warm clothing and cover your mouth and nose to warm the air that goes into your lungs. Tell your doctor if you get sensitive to the cold. Do not become pregnant while taking  this medicine or for 9 months after stopping it. Women should inform their health care professional if they wish to become pregnant or think they might be pregnant. Men should not father a child while taking this medicine and for 6 months after stopping it. There is potential for serious side effects to an unborn child. Talk to your health care professional for more information. Do not breast-feed a child while taking this medicine or for 3 months after stopping it. This medicine has caused ovarian failure in some women. This medicine may make it more difficult to get pregnant. Talk to your health care professional if you are concerned about your fertility. This medicine has caused decreased sperm counts in some men. This may make it more difficult to father a child. Talk to your health care professional if you are concerned about your fertility. This medicine may increase your risk of getting an infection. Call your health care professional for advice if you get a fever, chills, or sore throat, or other symptoms of a cold or flu. Do  not treat yourself. Try to avoid being around people who are sick. Avoid taking medicines that contain aspirin, acetaminophen, ibuprofen, naproxen, or ketoprofen unless instructed by your health care professional. These medicines may hide a fever. Be careful brushing or flossing your teeth or using a toothpick because you may get an infection or bleed more easily. If you have any dental work done, tell your dentist you are receiving this medicine. What side effects may I notice from receiving this medicine? Side effects that you should report to your doctor or health care professional as soon as possible:  allergic reactions like skin rash, itching or hives, swelling of the face, lips, or tongue  breathing problems  cough  low blood counts - this medicine may decrease the number of white blood cells, red blood cells, and platelets. You may be at increased risk for  infections and bleeding  nausea, vomiting  pain, redness, or irritation at site where injected  pain, tingling, numbness in the hands or feet  signs and symptoms of bleeding such as bloody or black, tarry stools; red or dark brown urine; spitting up blood or brown material that looks like coffee grounds; red spots on the skin; unusual bruising or bleeding from the eyes, gums, or nose  signs and symptoms of a dangerous change in heartbeat or heart rhythm like chest pain; dizziness; fast, irregular heartbeat; palpitations; feeling faint or lightheaded; falls  signs and symptoms of infection like fever; chills; cough; sore throat; pain or trouble passing urine  signs and symptoms of liver injury like dark yellow or brown urine; general ill feeling or flu-like symptoms; light-colored stools; loss of appetite; nausea; right upper belly pain; unusually weak or tired; yellowing of the eyes or skin  signs and symptoms of low red blood cells or anemia such as unusually weak or tired; feeling faint or lightheaded; falls  signs and symptoms of muscle injury like dark urine; trouble passing urine or change in the amount of urine; unusually weak or tired; muscle pain; back pain Side effects that usually do not require medical attention (report to your doctor or health care professional if they continue or are bothersome):  changes in taste  diarrhea  gas  hair loss  loss of appetite  mouth sores This list may not describe all possible side effects. Call your doctor for medical advice about side effects. You may report side effects to FDA at 1-800-FDA-1088. Where should I keep my medicine? This drug is given in a hospital or clinic and will not be stored at home. NOTE: This sheet is a summary. It may not cover all possible information. If you have questions about this medicine, talk to your doctor, pharmacist, or health care provider.  2021 [employer]/Gold Standard (2019-04-19 12:20:35)

## 2021-03-25 DIAGNOSIS — Z933 Colostomy status: Secondary | ICD-10-CM | POA: Diagnosis not present

## 2021-03-26 DIAGNOSIS — Z933 Colostomy status: Secondary | ICD-10-CM | POA: Diagnosis not present

## 2021-03-27 DIAGNOSIS — Z933 Colostomy status: Secondary | ICD-10-CM | POA: Diagnosis not present

## 2021-03-28 ENCOUNTER — Telehealth: Payer: Self-pay | Admitting: Hematology

## 2021-03-28 DIAGNOSIS — Z933 Colostomy status: Secondary | ICD-10-CM | POA: Diagnosis not present

## 2021-03-28 NOTE — Telephone Encounter (Signed)
Scheduled follow-up appointment per 4/11 los. Patient is aware.

## 2021-03-29 DIAGNOSIS — Z933 Colostomy status: Secondary | ICD-10-CM | POA: Diagnosis not present

## 2021-04-02 NOTE — Progress Notes (Signed)
[place]   [phone] Fax:([phone]) [phone]   Clinic Follow up Note   Patient Care Team: [name], DO as PCP - General (Family Medicine) Jonnie Finner, RN as Oncology Nurse Navigator [name], MD as Consulting Physician (Hematology) Icard, Octavio Graves, DO as Consulting Physician (Pulmonary Disease) Floris, P.A. [name], MD as Consulting Physician (General Surgery) [name], MD as Consulting Physician (General Surgery) [name], [name], MD as Consulting Physician (Gastroenterology)   Date of Service:  04/07/2021   CHIEF COMPLAINT:  F/u of rectal cancer  SUMMARY OF ONCOLOGIC HISTORY: Oncology History Overview Note  Cancer Staging Rectal cancer [facility]) Staging form: Colon and Rectum, AJCC 8th Edition - Pathologic stage from 03/03/2020: Stage IIIB (pT4a, pN1b, cM0) - Signed by [name], MD on 01/28/2021 Stage prefix: Initial diagnosis Histologic grading system: 4 grade system Histologic grade (G): G2 Residual tumor (R): R0 - None    Rectal cancer ([place])  03/03/2020 - 04/05/2020 Hospital Admission   She was admitted to ED on 03/03/20 for abdominal pain and nausea. She had been having diarrhea for several weeks. During hospital stay she developed acute respiratory failure, AKI, RUE DVT from PICC line. Work up showed bowel perforation, small left liver mass and thickening of jejunum. She underwent emergent surgery on 03/03/20 for resection and colostomy placement. Her path showed invasive cancer, metastatic to 3/15 LNs. She had a NGT in placed but this was removed on POD 3. Post op her stoma became necrotic and septic. She had another bowel surgery on 03/12/20, which was NED with necrotic tissue.    03/03/2020 Imaging   CT AP 03/03/20  IMPRESSION: 1. Free intraperitoneal air consistent perforation. Most likely site of perforation is in the distal splenic flexure/proximal descending colon where there is a collection of  air and gas measuring 6 centimeters. No obvious soft tissue mass identified in this region. At this site, there is abrupt transition of dilated, stool-filled colon to completely decompressed proximal descending colon. 2. Thickened, inflamed loops of jejunum are identified within the pelvis and are likely reactive. 3. Small hiatal hernia. 4. Benign-appearing 1.6 centimeter mass the LEFT hepatic lobe. Recommend comparison with prior studies if available. 5.  Emphysema (ICD10-J43.9). 6.  Aortic Atherosclerosis (ICD10-I70.0). 7. Bilateral renal scarring.   03/03/2020 Surgery   low anterior resection end colostomy by Dr Marcello Moores    03/03/2020 Initial Biopsy   FINAL MICROSCOPIC DIAGNOSIS: 03/03/20 A. RECTOSIGMOID COLON, LOW ANTERIOR RESECTION:  - Invasive colonic adenocarcinoma, 3.5 cm.  - Tumor invades the visceral peritoneum.  - Margins of resection are not involved.  - Metastatic carcinoma in (3) of (15) lymph nodes.  - See oncology table.   B. ADDITIONAL SIGMOID COLON, RESECTION:  - Colonic tissue, negative for carcinoma.  ADDENDUM:  Mismatch Repair Protein (IHC)   SUMMARY INTERPRETATION: NORMAL  There is preserved expression of the major MMR proteins. There is a very  low probability that microsatellite instability (MSI) is present.  However, certain clinically significant MMR protein mutations may result  in preservation of nuclear expression. It is recommended that the  preservation of protein expression be correlated with molecular based  MSI testing.   IHC EXPRESSION RESULTS  TEST           RESULT  MLH1:          Preserved nuclear expression  MSH2:          Preserved nuclear expression  MSH6:  Preserved nuclear expression  PMS2:          Preserved nuclear expression   03/03/2020 Cancer Staging   Staging form: Colon and Rectum, AJCC 8th Edition - Pathologic stage from 03/03/2020: Stage IIIB (pT4a, pN1b, cM0) - Signed by [name], MD on 01/28/2021 Stage prefix:  Initial diagnosis Histologic grading system: 4 grade system Histologic grade (G): G2 Residual tumor (R): R0 - None   03/08/2020 Imaging   CT AP 03/08/20 IMPRESSION: 1. Interval midline laparotomy with distal colon resection and diverting left lower quadrant colostomy. 2. Diffuse small bowel dilatation with gas fluid levels most consistent with postoperative ileus. 3. Trace ascites within the abdomen and pelvis. No fluid collection or abscess at this time. Surgical drain within the lower pelvis. 4. Indeterminate 1.4 cm subcapsular liver hypodensity. In light of newly diagnosed rectal cancer, metastatic disease cannot be excluded. PET CT may be useful for further evaluation. 5. Interval development of trace bilateral pleural effusions and diffuse body wall edema.   03/12/2020 Surgery   EXPLORATORY LAPAROTOMY WITH BOWEL RESECTION AND COLOSTOMY by Dr Hassell Done 03/12/20  FINAL MICROSCOPIC DIAGNOSIS: 03/12/20  A. COLON, SPLENIC FLEXURE, RESECTION:  - Segment of colon (37 cm) with perforation and associated inflammation  - Multiple mucosal ulcers with necrotizing inflammation  - No evidence of malignancy    03/12/2020 Imaging   CT AP WO contrast 03/12/20  IMPRESSION: 1. Exam is limited by lack of intravenous contrast, motion, and artifact from the patient's arms adjacent to the torso. Since 03/08/2020, there has been development of relatively large pockets of intraperitoneal free air. While intraperitoneal gas would not be unexpected on postoperative day 9, this gas is new since an intervening study of 03/08/2020 and given the relatively large volume certainly raises concern for bowel perforation. No source for the intraperitoneal free air is evident on this study. There is a surgical drain in the pelvis, but there is no free gas around the drain itself to suggest that it represents the source. 2. New circumferential wall thickening in the splenic flexure, descending colon and sigmoid  colon leading into the end colostomy. Infection/inflammation would be a consideration. Ischemia cannot be excluded. 3. Relatively small volume intraperitoneal free fluid. High attenuation small fluid collections in the left upper abdomen may reflect hemorrhage, infection or residua from prior perforation. 4. Interval progression of diffuse body wall edema. 5. Residual contrast material in the renal parenchyma from prior imaging, compatible with renal dysfunction.    03/19/2020 Imaging   CT CAP 03/19/20  IMPRESSION: 1. 5.5 x 3.2 cm fluid collection is noted along the greater curvature of the proximal stomach. 2. Surgical drain is again noted in the pelvis with tip in left lower quadrant. 3. Interval development of crescent-shaped fluid collection measuring 16.4 x 3.3 cm in the epigastric region and left upper quadrant of the abdomen which may extend into the left lower quadrant. Potentially this may represent abscess or developing abscess. Multiple other smaller fluid collections are noted which may represent small abscesses. 4. 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 5. Mild amount of free fluid is noted in the posterior pelvis. 6. Moderate anasarca is noted. Aortic Atherosclerosis (ICD10-I70.0).   04/12/2020 Imaging   CT AP 04/12/20  IMPRESSION: 1. Fluid collections within the LEFT abdomen have significantly decreased compared to previous CT exams, now nearly completely resolved. 2. Percutaneous drainage catheter with tip coiled posterior to the LEFT kidney, stable positioning compared to the previous study. The more anterior catheter has been  removed. 3. Small amount of free fluid persists within the abdomen and pelvis. 4. Trace bibasilar pleural effusions. 5. Anasarca. 6. While reviewing today's study, comparing with a chest/abdomen/pelvis CT from earlier same month, there is question of thrombus in the LEFT internal jugular vein. Recommend ultrasound of the LEFT IJ  to exclude DVT. This recommendation discussed with patient's hospitalist, Dr. Owens Shark, on 04/12/2020 at 4:20 p.m.   Emphysema (ICD10-J43.9).   04/22/2020 Imaging   CT AP 04/22/20  IMPRESSION: 1. No recurrent intra-abdominal abscess status post interval removal of left retroperitoneal percutaneous drain. Stable small amount of free pelvic fluid. 2. Enlarging dependent bilateral pleural effusions, now moderate in volume. Associated atelectasis at both lung bases. 3. Progressive anasarca with generalized edema throughout the subcutaneous fat. 4. Stable small subcapsular fluid collection along the anterior aspect of the left hepatic lobe. 5. Aortic Atherosclerosis (ICD10-I70.0).   05/23/2020 Initial Diagnosis   Rectal cancer ([place])   01/14/2021 Imaging   CT CAP IMPRESSION: -5.0 cm soft tissue mass along the posterior aspect of the cecum at the base of the appendix, favored to reflect a peritoneal/serosal implant, corresponding to the patient's known adenocarcinoma. This is new from the prior. -Suspected additional pelvic implants along the uterus and right adnexa, poorly visualized, new/progressive from the prior. -Additional pericapsular lesion along the lateral spleen is mildly progressive, suspicious for additional peritoneal implant. -No evidence of metastatic disease in the chest.   01/27/2021 PET scan   IMPRESSION:  1. Extensive hypermetabolic peritoneal metastasis, as detailed  above.  2. No evidence of hypermetabolic supradiaphragmatic disease.  3. Diffuse low-level thyroid hypermetabolism can be seen in the  setting of thyroiditis. Consider correlation with thyroid function  labs.  4. Aortic atherosclerosis (ICD10-I70.0) and emphysema (ICD10-J43.9).    02/01/2021 -  Chemotherapy    First-line chemo Xeloda 1500 mg twice daily for day 1-14, every 21 days, started on 02/01/2021. Reduced to 1 week on/ 1 week off and added bevacizumab q2weeks from C2 on 02/24/21.  ----Given good  tolerance, I changed to CAPOX and bevacizumab q2weeks from C4 on 03/24/21 with Xeloda 1526m in the AM and 10075min the PM 1 week on/1 week off.     Malignant neoplasm of appendix ([facility] 01/06/2021 Procedure   (surveillance colonoscopy for h/o rectal cancer) Impression:  - Preparation of the colon was fair, lavage performed with mostly adequate views. - Polypoid lesion obliterating appendiceal orifice. Biopsied to evaluate for malignancy. - The examined portion of the ileum was normal. - Two large polyps in the cecum, not removed today pending path at appendiceal orifice, bowel prep. If removed endoscopically in the future would favor EMR to be done at the hospital. - One 5 mm polyp at the ileocecal valve, removed with a cold snare. Resected and retrieved. - One 3 mm polyp in the transverse colon, removed with a cold snare. Resected and retrieved. - Parastomal hernia making initial entry to the distal colon challenging. - The examination was otherwise normal.   01/06/2021 Initial Biopsy   Diagnosis 1. Colon, biopsy, appendiceal oraface - ADENOCARCINOMA. 2. Colon, polyp(s), ileocecal valve, transverse, x2 - TUBULAR ADENOMA, NEGATIVE FOR HIGH GRADE DYSPLASIA (X1). - COLONIC MUCOSA WITH UNDERLYING LYMPHOID AGGREGATE, NEGATIVE FOR DYSPLASIA (X1).  MMR normal   01/06/2021 Initial Diagnosis   Malignant [place] of appendix ([facility] [place]  01/14/2021 Imaging   CT CAP IMPRESSION: -5.0 cm soft tissue mass along the posterior aspect of the cecum at the base of the appendix, favored to reflect a peritoneal/serosal  implant, corresponding to the patient's known adenocarcinoma. This is new from the prior. -Suspected additional pelvic implants along the uterus and right adnexa, poorly visualized, new/progressive from the prior. -Additional pericapsular lesion along the lateral spleen is mildly progressive, suspicious for additional peritoneal implant. -No evidence of metastatic disease in the chest.       CURRENT THERAPY:   First-line chemo Xeloda1500 mg twice daily for day 1-14, every 21 days, started on 02/01/2021. Reduced to 1 week on/ 1 week off and added bevacizumab q2weeks from C2 on 02/24/21. Given good tolerance, I changed to CAPOX and bevacizumab q2weeks from C4 on 03/24/21 with Xeloda 1523m in the AM and 1001min the PM 1 week on/1 week off.   INTERVAL HISTORY:  DoJOVANKA WESTGATEresents for a virtual follow up. She notes she tolerated last treatment well. She denies cold sensitivity or change in appetite. She is taking Xeloda 1 week on/1 week off and started current cycle today. She notes she needs refill through her mail.    REVIEW OF SYSTEMS:   Constitutional: Denies fevers, chills or abnormal weight loss Eyes: Denies blurriness of vision Ears, nose, mouth, throat, and face: Denies mucositis or sore throat Respiratory: Denies cough, dyspnea or wheezes Cardiovascular: Denies palpitation, chest discomfort or lower extremity swelling Gastrointestinal:  Denies nausea, heartburn or change in bowel habits Skin: Denies abnormal skin rashes Lymphatics: Denies new lymphadenopathy or easy bruising Neurological:Denies numbness, tingling or new weaknesses Behavioral/Psych: Mood is stable, no new changes  All other systems were reviewed with the patient and are negative.  MEDICAL HISTORY:  Past Medical History:  Diagnosis Date  . Acute deep vein thrombosis (DVT) of brachial vein of right upper extremity ([facility]CD[facility]  . Acute on chronic respiratory failure with hypoxia ([facility]  . Asthma   . Cataract   . [place], HX OF 01/06/2011   Qualifier: Diagnosis of  By: BlCharlett BlakeD, StErline Levine  . COPD (chronic obstructive pulmonary disease) ([facility]CDover2011   FeV1 31% predicted FeV1/FVX 47 %  . Essential hypertension 05/23/2020  . Hemorrhoid   . Open right radial fracture 2020  . Osteopenia   . Perforated sigmoid colon ([facility]CL[facility]3/21/2021  . Pleural effusion   . Pneumonia 2021  . Postoperative  intra-abdominal abscess 2021  . Rectal cancer ([facility] [place]  . Seasonal allergies    takes Claritin daily prn  . Shock circulatory ([facility] [place]  . Tracheostomy status ([facility]  . Vitamin D deficiency    takes Vit d every 14 days    SURGICAL HISTORY: Past Surgical History:  Procedure Laterality Date  . AUGMENTATION MAMMAPLASTY     saline  . EXAMINATION UNDER ANESTHESIA  10/14/2012   Procedure: EXAM UNDER ANESTHESIA;  Surgeon: ErGayland CurryMD,FACS;  [facility] Service: General;  Laterality: N/A;  rectal exam under anesthesia excisional hemorrhoidectomy hemorrhoidal banding x two  . EXAMINATION UNDER ANESTHESIA  02/03/2013   excision hemorrhoidal tissue  . FOOT SURGERY     left bunionectomy  . HEMORRHOIDECTOMY WITH HEMORRHOID BANDING  10/14/2012   Procedure: HEMORRHOIDECTOMY WITH HEMORRHOID BANDING;  Surgeon: ErGayland CurryMD,FACS;  [place] [facility] Service: General;  Laterality: N/A;  . IR IMAGING GUIDED PORT INSERTION  02/03/2021  . IR THORACENTESIS ASP PLEURAL SPACE W/IMG GUIDE  04/23/2020  . LAPAROTOMY N/A 03/03/2020   Procedure: low anterior resection end colostomy;  Surgeon: ThLeighton RuffMD;  [facility];  Service: General;  Laterality: N/A;  . LAPAROTOMY N/A  03/12/2020   Procedure: EXPLORATORY LAPAROTOMY WITH BOWEL RESECTION AND COLOSTOMY;  Surgeon: [name], MD;  Location: [facility];  Service: General;  Laterality: N/A;  . OPEN REDUCTION INTERNAL FIXATION (ORIF) DISTAL RADIAL FRACTURE Right 11/01/2018   Procedure: OPEN REDUCTION INTERNAL FIXATION (ORIF) DISTAL RADIAL FRACTURE;  Surgeon: [name], MD;  Location: [place];  Service: Orthopedics;  Laterality: Right;  . TONSILLECTOMY  1960   recurrent otitis media  . US ECHOCARDIOGRAPHY  02/2020    poor windows, normal LV function, severely dilated RV with moderately reduced function, RV volume and pressure overload, mildly dilated RA    I have reviewed the social history and family history with the patient  and they are unchanged from previous note.  ALLERGIES:  is allergic to amlodipine and codeine.  MEDICATIONS:  Current Outpatient Medications  Medication Sig Dispense Refill  . albuterol (PROVENTIL) (2.5 MG/3ML) 0.083% nebulizer solution Take 3 mLs (2.5 mg total) by nebulization every 4 (four) hours as needed for wheezing or shortness of breath. DX: J44.9 360 mL 5  . albuterol (VENTOLIN HFA) 108 (90 Base) MCG/ACT inhaler Inhale 2 puffs into the lungs every 6 (six) hours as needed for wheezing or shortness of breath. 8 g 5  . ALPRAZolam (XANAX) 0.25 MG tablet Take 1 tablet (0.25 mg total) by mouth at bedtime as needed for anxiety. 30 tablet 0  . capecitabine (XELODA) 500 MG tablet Take 3 tablets (1,500 mg total) by mouth 2 (two) times daily after a meal. Take for 7 days on, 7 days off, repeat every 14 days. 84 tablet 0  . diphenhydrAMINE (BENADRYL) 25 mg capsule Take 25 mg by mouth every 6 (six) hours as needed.    . docusate sodium (COLACE) 50 MG capsule Take 50 mg by mouth daily as needed for mild constipation.    . fluticasone (FLONASE) 50 MCG/ACT nasal spray Place 2 sprays into both nostrils daily. 16 mL 5  . Fluticasone-Umeclidin-Vilant (TRELEGY ELLIPTA) 100-62.5-25 MCG/INH AEPB Inhale 1 puff into the lungs daily. 60 each 5  . lidocaine-prilocaine (EMLA) cream Apply 1 application topically as needed. 30 g 0  . ondansetron (ZOFRAN) 8 MG tablet Take 1 tablet (8 mg total) by mouth every 8 (eight) hours as needed for nausea or vomiting. 20 tablet 2  . traMADol (ULTRAM) 50 MG tablet Take 1 tablet (50 mg total) by mouth every 6 (six) hours as needed. 30 tablet 0   No current facility-administered medications for this visit.    PHYSICAL EXAMINATION: ECOG PERFORMANCE STATUS: 1 - Symptomatic but completely ambulatory Blood pressure 162/8062, heart rate 63, respirate of 17, temperature 36.7, pulse ox 96% on room air GENERAL:alert, no distress and comfortable SKIN: skin color, texture, turgor are  normal, no rashes or significant lesions EYES: normal, Conjunctiva are pink and non-injected, sclera clear  NECK: supple, thyroid normal size, non-tender, without nodularity LYMPH:  no palpable lymphadenopathy in the cervical, axillary  LUNGS: clear to auscultation and percussion with normal breathing effort HEART: regular rate & rhythm and no murmurs and no lower extremity edema ABDOMEN:abdomen soft, non-tender and normal bowel sounds Musculoskeletal:no cyanosis of digits and no clubbing  NEURO: alert & oriented x 3 with fluent speech, no focal motor/sensory deficits   LABORATORY DATA:  I have reviewed the data as listed CBC Latest Ref Rng & Units 04/07/2021 03/24/2021 03/10/2021  WBC 4.0 - 10.5 K/uL 8.2 6.8 9.8  Hemoglobin 12.0 - 15.0 g/dL 12.6 13.0 13.4  Hematocrit 36.0 - 46.0 % 37.8  38.1 40.7  Platelets 150 - 400 K/uL 245 296 369     CMP Latest Ref Rng & Units 04/07/2021 03/24/2021 03/10/2021  Glucose 70 - 99 mg/dL 96 90 107(H)  BUN 8 - 23 mg/dL _0 Creatinine 0.44 - 1.00 mg/dL 1.00 1.03(H) 0.97  Sodium 135 - 145 mmol/L 140 138 140  Potassium 3.5 - 5.1 mmol/L 4.4 4.4 4.5  Chloride 98 - 111 mmol/L 103 103 101  CO2 22 - 32 mmol/L _1 Calcium 8.9 - 10.3 mg/dL 9.2 9.0 9.3  Total Protein 6.5 - 8.1 g/dL 6.8 7.2 7.6  Total Bilirubin 0.3 - 1.2 mg/dL 0.6 0.6 0.4  Alkaline Phos 38 - 126 U/L 96 102 110  AST 15 - 41 U/L _2 ALT 0 - 44 U/L _3 RADIOGRAPHIC STUDIES: I have personally reviewed the radiological images as listed and agreed with the findings in the report. No results found.   ASSESSMENT & PLAN:  Tracy [name] is a 70 y.o. female with    1. Rectosigmoid cancer(perforated),Stage III,pT4aN1b,with peroration,MSI Stable, peritoneal metastasis in 01/2021 -She was diagnosed in 02/2020.Workup showed she had bowel perforation and underwent emergent surgery. Pathology showed invasiverectosigmoidadenocarcinoma and perforatedbowel  with3/15positiveLN.Margins were clear. She haspermanentcolostomy bag in place. Her initial CT scan was negative for distant mets. -She hasstage III colon cancer and with bowel perforation she hasveryhigh risk of cancer recurrence,unfortunately she was not a candidate for adjuvant chemo after surgery due to very slow recovery.  -She did opt to proceed withGuardant Revealstudy in 06/06/20, 07/12/20 and 9/28/21which were allnegative.  -Unfortunately her screening colonoscopy on January 06, 2021 which showed a mass in theappendiceal orificeandbiopsy confirmed adenocarcinoma.01/27/21 PET showedtumor inappendiceal orificeis likely peritoneal met growing into cecum.  -Her peritoneal metastasis is mainly in the pelvis, difficult to biopsy.Her PET scan ispretty convincing for metastatic disease, I do not strongly feel she needs biopsy to confirm. -I started her on First-line chemo Xeloda1500 mg twice daily for day 1-14, every 21 days, started on 02/01/2021. Reduced to 1 week on/ 1 week off and added bevacizumab q2weeks from C2 on 02/24/21. Given good tolerance, I changed to CAPOX and bevacizumab q2weeks from C4 on 03/24/21 with Xeloda 1512m in the AM and 10062min the PM, 1 week on/1 week off.  -Plan to scan her again in 04/2021.  -She tolerating the addition of low dose Oxaliplatin well with no major issues. Labs reviewed and adequate to proceed with Oxaliplatin and Beva today. Will increase dose of Oxaliplatin today to 7071m2 given good tolerance.  -She will start C5 Xeloda today at 1500m65m the AM and 1000mg46mthe PM, 1 week on/1 week off -F/u in 2 weeks. -restaging CT in 2 or 4 weeks     2.ProvokedRUE DVTon 04/04/20. S/p 3 months of Eliquis  3. Comorbidities: Asthma, COPD, Anxiety -On nebulizer, Cymbalta andXanax 0.25mg 66mdaily, Benadryl and Flonase  -Continue to f/u with pulmonologist and she can request rescue inhaler from them.   4.Goal of care discussion  -The  patient understands the goal of care is palliative. -she is full code for now   PLAN: -Labs reviewed and adequate to proceed with Beva and Oxaliplatin today -Start C5 Xeloda today at 1500mg i67me AM and 1000mg in62m PM, 1 week on/1 week off -Lab, flush, f/u, Oxaliplatin and Beva in 2 weeks  -will order restaging CT CAP w contrast to be done in 2  or 4 weeks    No problem-specific Assessment & Plan notes found for this encounter.   Orders Placed This Encounter  Procedures  . CT CHEST ABDOMEN PELVIS W CONTRAST    Standing Status:   Future    Standing Expiration Date:   04/07/2022    Order Specific Question:   If indicated for the ordered procedure, I authorize the administration of contrast media per Radiology protocol    Answer:   Yes    Order Specific Question:   Preferred imaging location?    Answer:   [facility]    Order Specific Question:   Release to patient    Answer:   Immediate    Order Specific Question:   Is Oral Contrast requested for this exam?    Answer:   Yes, Per Radiology protocol    Order Specific Question:   Reason for Exam (SYMPTOM  OR DIAGNOSIS REQUIRED)    Answer:   evaluate response to chemo   All questions were answered. The patient knows to call the clinic with any problems, questions or concerns. No barriers to learning was detected. The total time spent in the appointment was 30 minutes.    [name], MD 04/07/2021   I, Joslyn Devon, am acting as scribe for [name], MD.   I have reviewed the above documentation for accuracy and completeness, and I agree with the above.

## 2021-04-07 ENCOUNTER — Inpatient Hospital Stay: Payer: Medicare HMO

## 2021-04-07 ENCOUNTER — Encounter: Payer: Self-pay | Admitting: Hematology

## 2021-04-07 ENCOUNTER — Inpatient Hospital Stay: Payer: Medicare HMO | Admitting: Hematology

## 2021-04-07 ENCOUNTER — Other Ambulatory Visit: Payer: Self-pay

## 2021-04-07 VITALS — BP 100/59 | HR 63 | Temp 98.0°F | Resp 17

## 2021-04-07 DIAGNOSIS — C20 Malignant neoplasm of rectum: Secondary | ICD-10-CM

## 2021-04-07 DIAGNOSIS — Z79899 Other long term (current) drug therapy: Secondary | ICD-10-CM | POA: Diagnosis not present

## 2021-04-07 DIAGNOSIS — Z933 Colostomy status: Secondary | ICD-10-CM | POA: Diagnosis not present

## 2021-04-07 DIAGNOSIS — Z5112 Encounter for antineoplastic immunotherapy: Secondary | ICD-10-CM | POA: Diagnosis not present

## 2021-04-07 DIAGNOSIS — J449 Chronic obstructive pulmonary disease, unspecified: Secondary | ICD-10-CM | POA: Diagnosis not present

## 2021-04-07 DIAGNOSIS — C786 Secondary malignant neoplasm of retroperitoneum and peritoneum: Secondary | ICD-10-CM | POA: Diagnosis not present

## 2021-04-07 DIAGNOSIS — Z5189 Encounter for other specified aftercare: Secondary | ICD-10-CM | POA: Diagnosis not present

## 2021-04-07 DIAGNOSIS — C19 Malignant neoplasm of rectosigmoid junction: Secondary | ICD-10-CM | POA: Diagnosis not present

## 2021-04-07 LAB — TOTAL PROTEIN, URINE DIPSTICK: Protein, ur: NEGATIVE mg/dL

## 2021-04-07 LAB — CBC WITH DIFFERENTIAL (CANCER CENTER ONLY)
Abs Immature Granulocytes: 0.01 10*3/uL (ref 0.00–0.07)
Basophils Absolute: 0 10*3/uL (ref 0.0–0.1)
Basophils Relative: 0 %
Eosinophils Absolute: 0.1 10*3/uL (ref 0.0–0.5)
Eosinophils Relative: 1 %
HCT: 37.8 % (ref 36.0–46.0)
Hemoglobin: 12.6 g/dL (ref 12.0–15.0)
Immature Granulocytes: 0 %
Lymphocytes Relative: 22 %
Lymphs Abs: 1.8 10*3/uL (ref 0.7–4.0)
MCH: 34.4 pg — ABNORMAL HIGH (ref 26.0–34.0)
MCHC: 33.3 g/dL (ref 30.0–36.0)
MCV: 103.3 fL — ABNORMAL HIGH (ref 80.0–100.0)
Monocytes Absolute: 0.8 10*3/uL (ref 0.1–1.0)
Monocytes Relative: 9 %
Neutro Abs: 5.5 10*3/uL (ref 1.7–7.7)
Neutrophils Relative %: 68 %
Platelet Count: 245 10*3/uL (ref 150–400)
RBC: 3.66 MIL/uL — ABNORMAL LOW (ref 3.87–5.11)
RDW: 15.8 % — ABNORMAL HIGH (ref 11.5–15.5)
WBC Count: 8.2 10*3/uL (ref 4.0–10.5)
nRBC: 0 % (ref 0.0–0.2)

## 2021-04-07 LAB — CMP (CANCER CENTER ONLY)
ALT: 16 U/L (ref 0–44)
AST: 28 U/L (ref 15–41)
Albumin: 4.1 g/dL (ref 3.5–5.0)
Alkaline Phosphatase: 96 U/L (ref 38–126)
Anion gap: 12 (ref 5–15)
BUN: 19 mg/dL (ref 8–23)
CO2: 25 mmol/L (ref 22–32)
Calcium: 9.2 mg/dL (ref 8.9–10.3)
Chloride: 103 mmol/L (ref 98–111)
Creatinine: 1 mg/dL (ref 0.44–1.00)
GFR, Estimated: >60 mL/min (ref >60)
Glucose, Bld: 96 mg/dL (ref 70–99)
Potassium: 4.4 mmol/L (ref 3.5–5.1)
Sodium: 140 mmol/L (ref 135–145)
Total Bilirubin: 0.6 mg/dL (ref 0.3–1.2)
Total Protein: 6.8 g/dL (ref 6.5–8.1)

## 2021-04-07 MED ORDER — SODIUM CHLORIDE 0.9 % IV SOLN
Freq: Once | INTRAVENOUS | Status: AC
  Filled 2021-04-07: qty 250

## 2021-04-07 MED ORDER — SODIUM CHLORIDE 0.9 % IV SOLN
5.0000 mg/kg | Freq: Once | INTRAVENOUS | Status: AC
  Administered 2021-04-07: 300 mg via INTRAVENOUS
  Filled 2021-04-07: qty 12

## 2021-04-07 MED ORDER — HEPARIN SOD (PORK) LOCK FLUSH 100 UNIT/ML IV SOLN
500.0000 [IU] | Freq: Once | INTRAVENOUS | Status: AC | PRN
  Administered 2021-04-07: 500 [IU]
  Filled 2021-04-07: qty 5

## 2021-04-07 MED ORDER — OXALIPLATIN CHEMO INJECTION 100 MG/20ML
50.0000 mg/m2 | Freq: Once | INTRAVENOUS | Status: DC
  Filled 2021-04-07: qty 16

## 2021-04-07 MED ORDER — DEXTROSE 5 % IV SOLN
Freq: Once | INTRAVENOUS | Status: AC
  Filled 2021-04-07: qty 250

## 2021-04-07 MED ORDER — DEXTROSE 5 % IV SOLN
70.0000 mg/m2 | Freq: Once | INTRAVENOUS | Status: AC
Start: 2021-04-07 — End: 2021-04-07
  Administered 2021-04-07: 115 mg via INTRAVENOUS
  Filled 2021-04-07: qty 10

## 2021-04-07 MED ORDER — PALONOSETRON HCL INJECTION 0.25 MG/5ML
INTRAVENOUS | Status: AC
  Filled 2021-04-07: qty 5

## 2021-04-07 MED ORDER — PALONOSETRON HCL INJECTION 0.25 MG/5ML
0.2500 mg | Freq: Once | INTRAVENOUS | Status: AC
  Administered 2021-04-07: 0.25 mg via INTRAVENOUS

## 2021-04-07 MED ORDER — SODIUM CHLORIDE 0.9 % IV SOLN
10.0000 mg | Freq: Once | INTRAVENOUS | Status: AC
  Administered 2021-04-07: 10 mg via INTRAVENOUS
  Filled 2021-04-07: qty 10

## 2021-04-07 MED ORDER — SODIUM CHLORIDE 0.9% FLUSH
10.0000 mL | INTRAVENOUS | Status: DC | PRN
  Administered 2021-04-07: 10 mL
  Filled 2021-04-07: qty 10

## 2021-04-07 NOTE — Patient Instructions (Signed)

## 2021-04-07 NOTE — Patient Instructions (Signed)
[facility]  Discharge Instructions: °Thank you for choosing [facility] to provide your oncology and hematology care.  ° °If you have a lab appointment with the Cancer Center, please go directly to the Cancer Center and check in at the registration area. °  °Wear comfortable clothing and clothing appropriate for easy access to any Portacath or PICC line.  ° °We strive to give you quality time with your provider. You may need to reschedule your appointment if you arrive late (15 or more minutes).  Arriving late affects you and other patients whose appointments are after yours.  Also, if you miss three or more appointments without notifying the office, you may be dismissed from the clinic at the provider’s discretion.    °  °For prescription refill requests, have your pharmacy contact our office and allow 72 hours for refills to be completed.   ° °Today you received the following chemotherapy and/or immunotherapy agents: Bevacizumab, Oxaliplatin.    °  °To help prevent nausea and vomiting after your treatment, we encourage you to take your nausea medication as directed. ° °BELOW ARE SYMPTOMS THAT SHOULD BE REPORTED IMMEDIATELY: °*FEVER GREATER THAN 100.4 F (38 °C) OR HIGHER °*CHILLS OR SWEATING °*NAUSEA AND VOMITING THAT IS NOT CONTROLLED WITH YOUR NAUSEA MEDICATION °*UNUSUAL SHORTNESS OF BREATH °*UNUSUAL BRUISING OR BLEEDING °*URINARY PROBLEMS (pain or burning when urinating, or frequent urination) °*BOWEL PROBLEMS (unusual diarrhea, constipation, pain near the anus) °TENDERNESS IN MOUTH AND THROAT WITH OR WITHOUT PRESENCE OF ULCERS (sore throat, sores in mouth, or a toothache) °UNUSUAL RASH, SWELLING OR PAIN  °UNUSUAL VAGINAL DISCHARGE OR ITCHING  ° °Items with * indicate a potential emergency and should be followed up as soon as possible or go to the Emergency Department if any problems should occur. ° °Please show the CHEMOTHERAPY ALERT CARD or IMMUNOTHERAPY ALERT CARD  at check-in to the Emergency Department and triage nurse. ° °Should you have questions after your visit or need to cancel or reschedule your appointment, please contact [facility]  Dept: [phone]  and follow the prompts.  Office hours are 8:00 a.m. to 4:30 p.m. Monday - Friday. Please note that voicemails left after 4:00 p.m. may not be returned until the following business day.  We are closed weekends and major holidays. You have access to a nurse at all times for urgent questions. Please call the main number to the clinic Dept: [phone] and follow the prompts. ° ° °For any non-urgent questions, you may also contact your provider using MyChart. We now offer e-Visits for anyone 18 and older to request care online for non-urgent symptoms. For details visit [email or web address]. °  °Also download the MyChart app! Go to the app store, search "MyChart", open the app, select [facility], and log in with your MyChart username and password. ° °Due to Covid, a mask is required upon entering the hospital/clinic. If you do not have a mask, one will be given to you upon arrival. For doctor visits, patients may have 1 support person aged 18 or older with them. For treatment visits, patients cannot have anyone with them due to current Covid guidelines and our immunocompromised population.  ° °

## 2021-04-08 ENCOUNTER — Telehealth: Payer: Self-pay | Admitting: Hematology

## 2021-04-08 NOTE — Telephone Encounter (Signed)
Scheduled follow-up appointment per 4/25 los. Patient is aware. ?

## 2021-04-14 ENCOUNTER — Telehealth: Payer: Self-pay

## 2021-04-14 ENCOUNTER — Other Ambulatory Visit: Payer: Self-pay | Admitting: Hematology

## 2021-04-14 DIAGNOSIS — C181 Malignant neoplasm of appendix: Secondary | ICD-10-CM

## 2021-04-14 MED ORDER — CAPECITABINE 500 MG PO TABS: 1000.0000 mg/m2 | ORAL_TABLET | Freq: Two times a day (BID) | ORAL | 1 refills | Status: DC

## 2021-04-14 NOTE — Telephone Encounter (Signed)
Received a call from [facility] requesting new prescription for Xeloda.

## 2021-04-17 ENCOUNTER — Telehealth: Payer: Self-pay

## 2021-04-17 NOTE — Telephone Encounter (Signed)
Ms Rollyson called stating she has had nausea, vomiting, and diarrhea since yesterday.  She states her grandchildren have been sick with the same symptoms and she feels this is a virus and not related to her chemotherapy as this is her off week.  She is tolerating liquids,  She is afebrile today but yesterday she had chills. She did not take her temperature yesterday. She has been taking zofran for her nausea.  Per Dr Burr Medico she is to take imodium for the diarrhea.  She is to call if the symptoms get worse.  She verbalized understanding.

## 2021-04-18 ENCOUNTER — Ambulatory Visit (HOSPITAL_COMMUNITY): Payer: Medicare HMO

## 2021-04-19 DIAGNOSIS — M6281 Muscle weakness (generalized): Secondary | ICD-10-CM | POA: Diagnosis not present

## 2021-04-19 DIAGNOSIS — J90 Pleural effusion, not elsewhere classified: Secondary | ICD-10-CM | POA: Diagnosis not present

## 2021-04-19 DIAGNOSIS — C20 Malignant neoplasm of rectum: Secondary | ICD-10-CM | POA: Diagnosis not present

## 2021-04-19 DIAGNOSIS — I82621 Acute embolism and thrombosis of deep veins of right upper extremity: Secondary | ICD-10-CM | POA: Diagnosis not present

## 2021-04-19 DIAGNOSIS — J449 Chronic obstructive pulmonary disease, unspecified: Secondary | ICD-10-CM | POA: Diagnosis not present

## 2021-04-19 DIAGNOSIS — J9600 Acute respiratory failure, unspecified whether with hypoxia or hypercapnia: Secondary | ICD-10-CM | POA: Diagnosis not present

## 2021-04-19 DIAGNOSIS — E43 Unspecified severe protein-calorie malnutrition: Secondary | ICD-10-CM | POA: Diagnosis not present

## 2021-04-19 DIAGNOSIS — R1310 Dysphagia, unspecified: Secondary | ICD-10-CM | POA: Diagnosis not present

## 2021-04-19 DIAGNOSIS — R69 Illness, unspecified: Secondary | ICD-10-CM | POA: Diagnosis not present

## 2021-04-20 NOTE — Progress Notes (Signed)
[place]   [phone] [phone]   Clinic Follow up Note   Patient Care Team: [name], DO as PCP - General (Family Medicine) Jonnie Finner, RN as Oncology Nurse Navigator [name], MD as Consulting Physician (Hematology) Icard, Octavio Graves, DO as Consulting Physician (Pulmonary Disease) [place], P.A. [name], MD as Consulting Physician (General Surgery) [name], MD as Consulting Physician (General Surgery) [name], [name], MD as Consulting Physician (Gastroenterology) 04/21/2021  CHIEF COMPLAINT: Follow up rectal cancer   SUMMARY OF ONCOLOGIC HISTORY: Oncology History Overview Note  Cancer Staging Rectal cancer [facility]) Staging form: Colon and Rectum, AJCC 8th Edition - Pathologic stage from 03/03/2020: Stage IIIB (pT4a, pN1b, cM0) - Signed by [name], MD on 01/28/2021 Stage prefix: Initial diagnosis Histologic grading system: 4 grade system Histologic grade (G): G2 Residual tumor (R): R0 - None    Rectal cancer ([place])  03/03/2020 - 04/05/2020 Hospital Admission   She was admitted to ED on 03/03/20 for abdominal pain and nausea. She had been having diarrhea for several weeks. During hospital stay she developed acute respiratory failure, AKI, RUE DVT from PICC line. Work up showed bowel perforation, small left liver mass and thickening of jejunum. She underwent emergent surgery on 03/03/20 for resection and colostomy placement. Her path showed invasive cancer, metastatic to 3/15 LNs. She had a NGT in placed but this was removed on POD 3. Post op her stoma became necrotic and septic. She had another bowel surgery on 03/12/20, which was NED with necrotic tissue.    03/03/2020 Imaging   CT AP 03/03/20  IMPRESSION: 1. Free intraperitoneal air consistent perforation. Most likely site of perforation is in the distal splenic flexure/proximal descending colon where there is a collection of air and gas measuring  6 centimeters. No obvious soft tissue mass identified in this region. At this site, there is abrupt transition of dilated, stool-filled colon to completely decompressed proximal descending colon. 2. Thickened, inflamed loops of jejunum are identified within the pelvis and are likely reactive. 3. Small hiatal hernia. 4. Benign-appearing 1.6 centimeter mass the LEFT hepatic lobe. Recommend comparison with prior studies if available. 5.  Emphysema (ICD10-J43.9). 6.  Aortic Atherosclerosis (ICD10-I70.0). 7. Bilateral renal scarring.   03/03/2020 Surgery   low anterior resection end colostomy by Dr Marcello Moores    03/03/2020 Initial Biopsy   FINAL MICROSCOPIC DIAGNOSIS: 03/03/20 A. RECTOSIGMOID COLON, LOW ANTERIOR RESECTION:  - Invasive colonic adenocarcinoma, 3.5 cm.  - Tumor invades the visceral peritoneum.  - Margins of resection are not involved.  - Metastatic carcinoma in (3) of (15) lymph nodes.  - See oncology table.   B. ADDITIONAL SIGMOID COLON, RESECTION:  - Colonic tissue, negative for carcinoma.  ADDENDUM:  Mismatch Repair Protein (IHC)   SUMMARY INTERPRETATION: NORMAL  There is preserved expression of the major MMR proteins. There is a very  low probability that microsatellite instability (MSI) is present.  However, certain clinically significant MMR protein mutations may result  in preservation of nuclear expression. It is recommended that the  preservation of protein expression be correlated with molecular based  MSI testing.   IHC EXPRESSION RESULTS  TEST           RESULT  MLH1:          Preserved nuclear expression  MSH2:          Preserved nuclear expression  MSH6:          Preserved nuclear expression  PMS2:  Preserved nuclear expression   03/03/2020 Cancer Staging   Staging form: Colon and Rectum, AJCC 8th Edition - Pathologic stage from 03/03/2020: Stage IIIB (pT4a, pN1b, cM0) - Signed by [name], MD on 01/28/2021 Stage prefix: Initial  diagnosis Histologic grading system: 4 grade system Histologic grade (G): G2 Residual tumor (R): R0 - None   03/08/2020 Imaging   CT AP 03/08/20 IMPRESSION: 1. Interval midline laparotomy with distal colon resection and diverting left lower quadrant colostomy. 2. Diffuse small bowel dilatation with gas fluid levels most consistent with postoperative ileus. 3. Trace ascites within the abdomen and pelvis. No fluid collection or abscess at this time. Surgical drain within the lower pelvis. 4. Indeterminate 1.4 cm subcapsular liver hypodensity. In light of newly diagnosed rectal cancer, metastatic disease cannot be excluded. PET CT may be useful for further evaluation. 5. Interval development of trace bilateral pleural effusions and diffuse body wall edema.   03/12/2020 Surgery   EXPLORATORY LAPAROTOMY WITH BOWEL RESECTION AND COLOSTOMY by Dr Hassell Done 03/12/20  FINAL MICROSCOPIC DIAGNOSIS: 03/12/20  A. COLON, SPLENIC FLEXURE, RESECTION:  - Segment of colon (37 cm) with perforation and associated inflammation  - Multiple mucosal ulcers with necrotizing inflammation  - No evidence of malignancy    03/12/2020 Imaging   CT AP WO contrast 03/12/20  IMPRESSION: 1. Exam is limited by lack of intravenous contrast, motion, and artifact from the patient's arms adjacent to the torso. Since 03/08/2020, there has been development of relatively large pockets of intraperitoneal free air. While intraperitoneal gas would not be unexpected on postoperative day 9, this gas is new since an intervening study of 03/08/2020 and given the relatively large volume certainly raises concern for bowel perforation. No source for the intraperitoneal free air is evident on this study. There is a surgical drain in the pelvis, but there is no free gas around the drain itself to suggest that it represents the source. 2. New circumferential wall thickening in the splenic flexure, descending colon and sigmoid colon  leading into the end colostomy. Infection/inflammation would be a consideration. Ischemia cannot be excluded. 3. Relatively small volume intraperitoneal free fluid. High attenuation small fluid collections in the left upper abdomen may reflect hemorrhage, infection or residua from prior perforation. 4. Interval progression of diffuse body wall edema. 5. Residual contrast material in the renal parenchyma from prior imaging, compatible with renal dysfunction.    03/19/2020 Imaging   CT CAP 03/19/20  IMPRESSION: 1. 5.5 x 3.2 cm fluid collection is noted along the greater curvature of the proximal stomach. 2. Surgical drain is again noted in the pelvis with tip in left lower quadrant. 3. Interval development of crescent-shaped fluid collection measuring 16.4 x 3.3 cm in the epigastric region and left upper quadrant of the abdomen which may extend into the left lower quadrant. Potentially this may represent abscess or developing abscess. Multiple other smaller fluid collections are noted which may represent small abscesses. 4. 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 5. Mild amount of free fluid is noted in the posterior pelvis. 6. Moderate anasarca is noted. Aortic Atherosclerosis (ICD10-I70.0).   04/12/2020 Imaging   CT AP 04/12/20  IMPRESSION: 1. Fluid collections within the LEFT abdomen have significantly decreased compared to previous CT exams, now nearly completely resolved. 2. Percutaneous drainage catheter with tip coiled posterior to the LEFT kidney, stable positioning compared to the previous study. The more anterior catheter has been removed. 3. Small amount of free fluid persists within the abdomen and pelvis. 4.  Trace bibasilar pleural effusions. 5. Anasarca. 6. While reviewing today's study, comparing with a chest/abdomen/pelvis CT from earlier same month, there is question of thrombus in the LEFT internal jugular vein. Recommend ultrasound of the LEFT IJ to  exclude DVT. This recommendation discussed with patient's hospitalist, Dr. Owens Shark, on 04/12/2020 at 4:20 p.m.   Emphysema (ICD10-J43.9).   04/22/2020 Imaging   CT AP 04/22/20  IMPRESSION: 1. No recurrent intra-abdominal abscess status post interval removal of left retroperitoneal percutaneous drain. Stable small amount of free pelvic fluid. 2. Enlarging dependent bilateral pleural effusions, now moderate in volume. Associated atelectasis at both lung bases. 3. Progressive anasarca with generalized edema throughout the subcutaneous fat. 4. Stable small subcapsular fluid collection along the anterior aspect of the left hepatic lobe. 5. Aortic Atherosclerosis (ICD10-I70.0).   05/23/2020 Initial Diagnosis   Rectal cancer ([place])   01/14/2021 Imaging   CT CAP IMPRESSION: -5.0 cm soft tissue mass along the posterior aspect of the cecum at the base of the appendix, favored to reflect a peritoneal/serosal implant, corresponding to the patient's known adenocarcinoma. This is new from the prior. -Suspected additional pelvic implants along the uterus and right adnexa, poorly visualized, new/progressive from the prior. -Additional pericapsular lesion along the lateral spleen is mildly progressive, suspicious for additional peritoneal implant. -No evidence of metastatic disease in the chest.   01/27/2021 PET scan   IMPRESSION:  1. Extensive hypermetabolic peritoneal metastasis, as detailed  above.  2. No evidence of hypermetabolic supradiaphragmatic disease.  3. Diffuse low-level thyroid hypermetabolism can be seen in the  setting of thyroiditis. Consider correlation with thyroid function  labs.  4. Aortic atherosclerosis (ICD10-I70.0) and emphysema (ICD10-J43.9).    02/01/2021 -  Chemotherapy    First-line chemo Xeloda 1500 mg twice daily for day 1-14, every 21 days, started on 02/01/2021. Reduced to 1 week on/ 1 week off and added bevacizumab q2weeks from C2 on 02/24/21.  ----Given good  tolerance, I changed to CAPOX and bevacizumab q2weeks from C4 on 03/24/21 with Xeloda 1580m in the AM and 10040min the PM 1 week on/1 week off.     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 - Polypoid lesion obliterating appendiceal orifice. Biopsied to evaluate for malignancy. - The examined portion of the ileum was normal. - Two large polyps in the cecum, not removed today pending path at appendiceal orifice, bowel prep. If removed endoscopically in the future would favor EMR to be done at the hospital. - One 5 mm polyp at the ileocecal valve, removed with a cold snare. Resected and retrieved. - One 3 mm polyp in the transverse colon, removed with a cold snare. Resected and retrieved. - Parastomal hernia making initial entry to the distal colon challenging. - The examination was otherwise normal.   01/06/2021 Initial Biopsy   Diagnosis 1. Colon, biopsy, appendiceal oraface - ADENOCARCINOMA. 2. Colon, polyp(s), ileocecal valve, transverse, x2 - TUBULAR ADENOMA, NEGATIVE FOR HIGH GRADE DYSPLASIA (X1). - COLONIC MUCOSA WITH UNDERLYING LYMPHOID AGGREGATE, NEGATIVE FOR DYSPLASIA (X1).  MMR normal   01/06/2021 Initial Diagnosis   Malignant neoplasm of appendix ([facility]  01/14/2021 Imaging   CT CAP IMPRESSION: -5.0 cm soft tissue mass along the posterior aspect of the cecum at the base of the appendix, favored to reflect a peritoneal/serosal implant, corresponding to the patient's known adenocarcinoma. This is new from the prior. -Suspected  additional pelvic implants along the uterus and right adnexa, poorly visualized, new/progressive from the prior. -Additional pericapsular lesion along the lateral spleen is mildly progressive, suspicious for additional peritoneal implant. -No evidence of metastatic disease in the chest.      CURRENT THERAPY: First-line chemoXeloda1500 mg twice daily for day 1-14, every 21 days, started on 02/01/2021. Reduced to 1 week on/ 1 week offand addedbevacizumab q2weeksfrom C2 on3/14/22.Given good tolerance, I changed to CAPOX and bevacizumab q2weeks from C4 on 03/24/21 with Xeloda 1512m in the AM and 10037min the PM 1 week on/1 week off.   INTERVAL HISTORY: Ms. OgKaganeturns for follow up and treatment as scheduled. She completed cycle 2 CAPOX starting 04/07/21. She missed CT on 5/6 due to GI virus, it is rescheduled to 5/17.  She got nausea/vomiting/diarrhea and fever with chills from her granddaughter, it went through the family.  She has recovered well.  She tolerates being on Xeloda without significant side effects.  Cold sensitivity improves on her week off, denies neuropathy in the absence of cold exposure.  She is able to eat and drink well.  Denies mucositis.  Denies new/worsening pain, chest pain, dyspnea, or any new issues.  MEDICAL HISTORY:  Past Medical History:  Diagnosis Date  . Acute deep vein thrombosis (DVT) of brachial vein of right upper extremity ([facility]  . Acute on chronic respiratory failure with hypoxia ([facility]CT[facility] [place]  . Asthma   . Cataract   . CHICKEN[place]X, HX OF 01/06/2011   Qualifier: Diagnosis of  By: BlCharlett BlakeD, StErline Levine  . COPD (chronic obstructive pulmonary disease) ([facility]dent[facility]2011   FeV1 31% predicted FeV1/FVX 47 %  . Essential hypertension 05/23/2020  . Hemorrhoid   . Open right radial fracture 2020  . Osteopenia   . Perforated sigmoid colon (HCDepew3/21/2021  . Pleural effusion   . Pneumonia 2021  . Postoperative intra-abdominal abscess 2021  . Rectal cancer ([facility]CA[place]  . Seasonal allergies    takes Claritin daily prn  . Shock circulatory (HCSt. Bernice  . Tracheostomy status (HCRose[place]  . Vitamin D deficiency    takes Vit d every 14 days    SURGICAL HISTORY: Past Surgical History:  Procedure Laterality Date  . AUGMENTATION MAMMAPLASTY     saline  . EXAMINATION  UNDER ANESTHESIA  10/14/2012   Procedure: EXAM UNDER ANESTHESIA;  Surgeon: ErGayland CurryMD,FACS;  [facility];  Laterality: N/A;  rectal exam under anesthesia excisional hemorrhoidectomy hemorrhoidal banding x two  . EXAMINATION UNDER ANESTHESIA  02/03/2013   excision hemorrhoidal tissue  . FOOT SURGERY     left bunionectomy  . HEMORRHOIDECTOMY WITH HEMORRHOID BANDING  10/14/2012   Procedure: HEMORRHOIDECTOMY WITH HEMORRHOID BANDING;  Surgeon: ErGayland CurryMD,FACS;  [facility]: General;  Laterality: N/A;  . IR IMAGING GUIDED PORT INSERTION  02/03/2021  . IR THORACENTESIS ASP PLEURAL SPACE W/IMG GUIDE  04/23/2020  . LAPAROTOMY N/A 03/03/2020   Procedure: low anterior resection end colostomy;  Surgeon: ThLeighton RuffMD;  Location: [facility];  Service: General;  Laterality: N/A;  . LAPAROTOMY N/A 03/12/2020   Procedure: EXPLORATORY LAPAROTOMY WITH BOWEL RESECTION AND COLOSTOMY;  Surgeon: MaJohnathan HausenMD;  Location: [facility];  Service: General;  Laterality: N/A;  . OPEN REDUCTION INTERNAL FIXATION (ORIF) DISTAL RADIAL FRACTURE Right 11/01/2018   Procedure: OPEN REDUCTION INTERNAL FIXATION (ORIF) DISTAL RADIAL FRACTURE;  Surgeon: KuLeanora CoverMD;  [place] [facility] Service: Orthopedics;  Laterality: Right;  . TONSILLECTOMY  1960   recurrent otitis media  . US ECHOCARDIOGRAPHY  02/2020    poor windows, normal LV function, severely dilated RV with moderately reduced function, RV volume and pressure overload, mildly dilated RA    I have reviewed the social history and family history with the patient and they are unchanged from previous note.  ALLERGIES:  is allergic to amlodipine and codeine.  MEDICATIONS:  Current Outpatient Medications  Medication Sig Dispense Refill  . albuterol (PROVENTIL) (2.5 MG/3ML) 0.083% nebulizer solution Take 3 mLs (2.5 mg total) by nebulization every 4 (four) hours as needed for wheezing or shortness of breath.  DX: J44.9 360 mL 5  . albuterol (VENTOLIN HFA) 108 (90 Base) MCG/ACT inhaler Inhale 2 puffs into the lungs every 6 (six) hours as needed for wheezing or shortness of breath. 8 g 5  . ALPRAZolam (XANAX) 0.25 MG tablet Take 1 tablet (0.25 mg total) by mouth at bedtime as needed for anxiety. 30 tablet 0  . capecitabine (XELODA) 500 MG tablet Take 3 tablets (1,500 mg total) by mouth 2 (two) times daily after a meal. Take for 7 days on, 7 days off, repeat every 14 days. 84 tablet 1  . diphenhydrAMINE (BENADRYL) 25 mg capsule Take 25 mg by mouth every 6 (six) hours as needed.    . docusate sodium (COLACE) 50 MG capsule Take 50 mg by mouth daily as needed for mild constipation.    . fluticasone (FLONASE) 50 MCG/ACT nasal spray Place 2 sprays into both nostrils daily. 16 mL 5  . Fluticasone-Umeclidin-Vilant (TRELEGY ELLIPTA) 100-62.5-25 MCG/INH AEPB Inhale 1 puff into the lungs daily. 60 each 5  . lidocaine-prilocaine (EMLA) cream Apply 1 application topically as needed. 30 g 0  . ondansetron (ZOFRAN) 8 MG tablet Take 1 tablet (8 mg total) by mouth every 8 (eight) hours as needed for nausea or vomiting. 20 tablet 2  . traMADol (ULTRAM) 50 MG tablet Take 1 tablet (50 mg total) by mouth every 6 (six) hours as needed. 30 tablet 0   No current facility-administered medications for this visit.    PHYSICAL EXAMINATION: ECOG PERFORMANCE STATUS: 1 - Symptomatic but completely ambulatory  Vitals:   04/21/21 1107  BP: (!) 162/85  Pulse: 92  Resp: 16  Temp: (!) 97.2 F (36.2 C)  SpO2: 99%   Filed Weights   04/21/21 1107  Weight: 133 lb 4.8 oz (60.5 kg)    GENERAL:alert, no distress and comfortable SKIN: No rash, Palms without erythema EYES: sclera clear OROPHARYNX: No thrush or ulcers LUNGS:  normal breathing effort HEART:  no lower extremity edema NEURO: alert & oriented x 3 with fluent speech, no focal motor/sensory deficits PAC without erythema  LABORATORY DATA:  I have reviewed the data as  listed CBC Latest Ref Rng & Units 04/21/2021 04/07/2021 03/24/2021  WBC 4.0 - 10.5 K/uL 8.9 8.2 6.8  Hemoglobin 12.0 - 15.0 g/dL 12.2 12.6 13.0  Hematocrit 36.0 - 46.0 % 37.1 37.8 38.1  Platelets 150 - 400 K/uL 256 245 296     CMP Latest Ref Rng & Units 04/21/2021 04/07/2021 03/24/2021  Glucose 70 - 99 mg/dL 94 96 90  BUN 8 - 23 mg/dL 15 19 14   Creatinine 0.44 - 1.00 mg/dL 0.94 1.00 1.03(H)  Sodium 135 - 145 mmol/L 141 140 138  Potassium 3.5 - 5.1 mmol/L 3.6 4.4 4.4  Chloride 98 - 111 mmol/L 105 103 103  CO2 22 - 32 mmol/L 24 25 23  Calcium 8.9 - 10.3 mg/dL 9.0 9.2 9.0  Total Protein 6.5 - 8.1 g/dL 6.7 6.8 7.2  Total Bilirubin 0.3 - 1.2 mg/dL 0.5 0.6 0.6  Alkaline Phos 38 - 126 U/L 103 96 102  AST 15 - 41 U/L 31 28 29   ALT 0 - 44 U/L 22 16 16       RADIOGRAPHIC STUDIES: I have personally reviewed the radiological images as listed and agreed with the findings in the report. No results found.   ASSESSMENT & PLAN: 70 yo female   1. [name], MMR normal,peritoneal metastasis in 01/2021 -diagnosed 02/2020, due to perforation underwent emergent surgery. Path showed invasive adenocarcinoma with perforation, +3/15 LNs, and clear margins. Initial CT scan negative for distant metastasis.  -due to bowel perforation and + LNs she has very high risk for recurrence, unfortunately she was not a candidate for adjuvant chemo due to very slow recovery from surgery -Guardant Reveal ctDNA were all negative x3 (06/06/20, 07/12/20, and 09/10/20).  -surveillance CT 08/27/20 showed a small density adjacent to the spleen that had decreased, no other evidence of recurrent or metastatic disease  -first surveillance colonoscopy by Dr. Havery Moros 01/06/21 showed new adenocarcinoma at appendiceal orifice and other tubular adenomas throughout the colon.Biopsy confirmed adenocarcinoma -01/27/2021 PET showed tumor in appendiceal orifice is likely peritoneal metastasis growing into  the cecum.Her peritoneal implants are mainly in the pelvis, difficult to biopsy, Dr. Burr Medico did not feel strongly that she needed a biopsy to confirm -Dr. Burr Medico recommended first-line Capox,she began single agent Xeloda with cycle 1 on 2/19.Due to low performance status with cycle 2 was changed to 1 week on/1 week off, and beva was added. She tolerates this well -PS improved and low dose oxaliplatin was added with C4, now on Capeox/beva q2 weeks, with Xeloda 1 week on/1 week off.  -restage 04/29/21  2. H/o provoked RUE DVT 04/04/20 s/p 3 months AC (Eliquis)  3. Comorbidities: Asthma, COPD, anxiety -on nebulizer and trelegy, has not needed rescue inhaler recently  -f/up pulmonology and PCP   Disposition: Ms. Linck appears well.  She completed another cycle of bevacizumab and Capeox with Xeloda 1500 mg BID 1 week on/1 week off.  Tolerates treatment well with mild cold sensitivity, no other significant toxicities.  Side effects are well managed at home.  She is able to recover and function well.  There is no clinical evidence of disease progression.  Labs reviewed, she will proceed with another cycle of Capeox and bevacizumab today as planned. She is scheduled for restaging CT on 04/29/21. She will return for f/up and next cycle on 05/05/21.    All questions were answered. The patient knows to call the clinic with any problems, questions or concerns. No barriers to learning were detected.     Alla Feeling, NP 04/21/21

## 2021-04-21 ENCOUNTER — Inpatient Hospital Stay: Payer: Medicare HMO | Attending: Hematology

## 2021-04-21 ENCOUNTER — Other Ambulatory Visit: Payer: Self-pay

## 2021-04-21 ENCOUNTER — Inpatient Hospital Stay: Payer: Medicare HMO | Admitting: Nurse Practitioner

## 2021-04-21 ENCOUNTER — Inpatient Hospital Stay: Payer: Medicare HMO

## 2021-04-21 ENCOUNTER — Encounter: Payer: Self-pay | Admitting: Nurse Practitioner

## 2021-04-21 ENCOUNTER — Other Ambulatory Visit: Payer: Medicare HMO

## 2021-04-21 VITALS — BP 162/85 | HR 92 | Temp 97.2°F | Resp 16 | Ht 62.0 in | Wt 133.3 lb

## 2021-04-21 DIAGNOSIS — C20 Malignant neoplasm of rectum: Secondary | ICD-10-CM | POA: Diagnosis not present

## 2021-04-21 DIAGNOSIS — Z79899 Other long term (current) drug therapy: Secondary | ICD-10-CM | POA: Insufficient documentation

## 2021-04-21 DIAGNOSIS — C786 Secondary malignant neoplasm of retroperitoneum and peritoneum: Secondary | ICD-10-CM | POA: Insufficient documentation

## 2021-04-21 DIAGNOSIS — C19 Malignant neoplasm of rectosigmoid junction: Secondary | ICD-10-CM | POA: Insufficient documentation

## 2021-04-21 DIAGNOSIS — Z7901 Long term (current) use of anticoagulants: Secondary | ICD-10-CM | POA: Diagnosis not present

## 2021-04-21 DIAGNOSIS — Z933 Colostomy status: Secondary | ICD-10-CM | POA: Diagnosis not present

## 2021-04-21 DIAGNOSIS — Z86718 Personal history of other venous thrombosis and embolism: Secondary | ICD-10-CM | POA: Diagnosis not present

## 2021-04-21 DIAGNOSIS — F419 Anxiety disorder, unspecified: Secondary | ICD-10-CM | POA: Diagnosis not present

## 2021-04-21 DIAGNOSIS — R69 Illness, unspecified: Secondary | ICD-10-CM | POA: Diagnosis not present

## 2021-04-21 DIAGNOSIS — J449 Chronic obstructive pulmonary disease, unspecified: Secondary | ICD-10-CM | POA: Diagnosis not present

## 2021-04-21 DIAGNOSIS — Z5112 Encounter for antineoplastic immunotherapy: Secondary | ICD-10-CM | POA: Diagnosis not present

## 2021-04-21 DIAGNOSIS — Z5111 Encounter for antineoplastic chemotherapy: Secondary | ICD-10-CM | POA: Diagnosis not present

## 2021-04-21 DIAGNOSIS — Z95828 Presence of other vascular implants and grafts: Secondary | ICD-10-CM

## 2021-04-21 LAB — CBC WITH DIFFERENTIAL (CANCER CENTER ONLY)
Abs Immature Granulocytes: 0.03 10*3/uL (ref 0.00–0.07)
Basophils Absolute: 0.1 10*3/uL (ref 0.0–0.1)
Basophils Relative: 1 %
Eosinophils Absolute: 0.1 10*3/uL (ref 0.0–0.5)
Eosinophils Relative: 1 %
HCT: 37.1 % (ref 36.0–46.0)
Hemoglobin: 12.2 g/dL (ref 12.0–15.0)
Immature Granulocytes: 0 %
Lymphocytes Relative: 25 %
Lymphs Abs: 2.2 10*3/uL (ref 0.7–4.0)
MCH: 34.2 pg — ABNORMAL HIGH (ref 26.0–34.0)
MCHC: 32.9 g/dL (ref 30.0–36.0)
MCV: 103.9 fL — ABNORMAL HIGH (ref 80.0–100.0)
Monocytes Absolute: 1 10*3/uL (ref 0.1–1.0)
Monocytes Relative: 12 %
Neutro Abs: 5.5 10*3/uL (ref 1.7–7.7)
Neutrophils Relative %: 61 %
Platelet Count: 256 10*3/uL (ref 150–400)
RBC: 3.57 MIL/uL — ABNORMAL LOW (ref 3.87–5.11)
RDW: 15.7 % — ABNORMAL HIGH (ref 11.5–15.5)
WBC Count: 8.9 10*3/uL (ref 4.0–10.5)
nRBC: 0 % (ref 0.0–0.2)

## 2021-04-21 LAB — CMP (CANCER CENTER ONLY)
ALT: 22 U/L (ref 0–44)
AST: 31 U/L (ref 15–41)
Albumin: 3.7 g/dL (ref 3.5–5.0)
Alkaline Phosphatase: 103 U/L (ref 38–126)
Anion gap: 12 (ref 5–15)
BUN: 15 mg/dL (ref 8–23)
CO2: 24 mmol/L (ref 22–32)
Calcium: 9 mg/dL (ref 8.9–10.3)
Chloride: 105 mmol/L (ref 98–111)
Creatinine: 0.94 mg/dL (ref 0.44–1.00)
GFR, Estimated: >60 mL/min (ref >60)
Glucose, Bld: 94 mg/dL (ref 70–99)
Potassium: 3.6 mmol/L (ref 3.5–5.1)
Sodium: 141 mmol/L (ref 135–145)
Total Bilirubin: 0.5 mg/dL (ref 0.3–1.2)
Total Protein: 6.7 g/dL (ref 6.5–8.1)

## 2021-04-21 LAB — IRON AND TIBC
Iron: 63 ug/dL (ref 41–142)
Saturation Ratios: 17 % — ABNORMAL LOW (ref 21–57)
TIBC: 361 ug/dL (ref 236–444)
UIBC: 299 ug/dL (ref 120–384)

## 2021-04-21 LAB — FERRITIN: Ferritin: 198 ng/mL (ref 11–307)

## 2021-04-21 LAB — TOTAL PROTEIN, URINE DIPSTICK: Protein, ur: NEGATIVE mg/dL

## 2021-04-21 MED ORDER — ALTEPLASE 2 MG IJ SOLR
2.0000 mg | Freq: Once | INTRAMUSCULAR | Status: AC
  Administered 2021-04-21: 2 mg
  Filled 2021-04-21: qty 2

## 2021-04-21 MED ORDER — SODIUM CHLORIDE 0.9 % IV SOLN
Freq: Once | INTRAVENOUS | Status: AC
  Filled 2021-04-21: qty 250

## 2021-04-21 MED ORDER — TRELEGY ELLIPTA 100-62.5-25 MCG/INH IN AEPB: 1.0000 | INHALATION_SPRAY | Freq: Every day | RESPIRATORY_TRACT | 2 refills | Status: DC

## 2021-04-21 MED ORDER — SODIUM CHLORIDE 0.9% FLUSH
10.0000 mL | INTRAVENOUS | Status: DC | PRN
  Filled 2021-04-21: qty 10

## 2021-04-21 MED ORDER — SODIUM CHLORIDE 0.9% FLUSH
10.0000 mL | INTRAVENOUS | Status: DC | PRN
  Administered 2021-04-21: 10 mL
  Filled 2021-04-21: qty 10

## 2021-04-21 MED ORDER — SODIUM CHLORIDE 0.9 % IV SOLN
10.0000 mg | Freq: Once | INTRAVENOUS | Status: AC
  Administered 2021-04-21: 10 mg via INTRAVENOUS
  Filled 2021-04-21: qty 10

## 2021-04-21 MED ORDER — PALONOSETRON HCL INJECTION 0.25 MG/5ML
0.2500 mg | Freq: Once | INTRAVENOUS | Status: AC
  Administered 2021-04-21: 0.25 mg via INTRAVENOUS

## 2021-04-21 MED ORDER — DEXTROSE 5 % IV SOLN
Freq: Once | INTRAVENOUS | Status: AC
Start: 2021-04-21 — End: 2021-04-21
  Filled 2021-04-21: qty 250

## 2021-04-21 MED ORDER — PALONOSETRON HCL INJECTION 0.25 MG/5ML
INTRAVENOUS | Status: AC
  Filled 2021-04-21: qty 5

## 2021-04-21 MED ORDER — OXALIPLATIN CHEMO INJECTION 100 MG/20ML
70.0000 mg/m2 | Freq: Once | INTRAVENOUS | Status: AC
  Administered 2021-04-21: 115 mg via INTRAVENOUS
  Filled 2021-04-21: qty 20

## 2021-04-21 MED ORDER — ALTEPLASE 2 MG IJ SOLR
INTRAMUSCULAR | Status: AC
  Filled 2021-04-21: qty 2

## 2021-04-21 MED ORDER — HEPARIN SOD (PORK) LOCK FLUSH 100 UNIT/ML IV SOLN
500.0000 [IU] | Freq: Once | INTRAVENOUS | Status: AC | PRN
  Administered 2021-04-21: 500 [IU]
  Filled 2021-04-21: qty 5

## 2021-04-21 MED ORDER — SODIUM CHLORIDE 0.9 % IV SOLN
5.0000 mg/kg | Freq: Once | INTRAVENOUS | Status: AC
  Administered 2021-04-21: 300 mg via INTRAVENOUS
  Filled 2021-04-21: qty 12

## 2021-04-21 NOTE — Patient Instructions (Signed)
Implanted Port Insertion, Care After This sheet gives you information about how to care for yourself after your procedure. Your health care provider may also give you more specific instructions. If you have problems or questions, contact your health care provider. What can I expect after the procedure? After the procedure, it is common to have:  Discomfort at the port insertion site.  Bruising on the skin over the port. This should improve over 3-4 days. Follow these instructions at home: Port care  After your port is placed, you will get a manufacturer's information card. The card has information about your port. Keep this card with you at all times.  Take care of the port as told by your health care provider. Ask your health care provider if you or a family member can get training for taking care of the port at home. A home health care nurse may also take care of the port.  Make sure to remember what type of port you have. Incision care  Follow instructions from your health care provider about how to take care of your port insertion site. Make sure you: ? Wash your hands with soap and water before and after you change your bandage (dressing). If soap and water are not available, use hand sanitizer. ? Change your dressing as told by your health care provider. ? Leave stitches (sutures), skin glue, or adhesive strips in place. These skin closures may need to stay in place for 2 weeks or longer. If adhesive strip edges start to loosen and curl up, you may trim the loose edges. Do not remove adhesive strips completely unless your health care provider tells you to do that.  Check your port insertion site every day for signs of infection. Check for: ? Redness, swelling, or pain. ? Fluid or blood. ? Warmth. ? Pus or a bad smell.      Activity  Return to your normal activities as told by your health care provider. Ask your health care provider what activities are safe for you.  Do not  lift anything that is heavier than 10 lb (4.5 kg), or the limit that you are told, until your health care provider says that it is safe. General instructions  Take over-the-counter and prescription medicines only as told by your health care provider.  Do not take baths, swim, or use a hot tub until your health care provider approves. Ask your health care provider if you may take showers. You may only be allowed to take sponge baths.  Do not drive for 24 hours if you were given a sedative during your procedure.  Wear a medical alert bracelet in case of an emergency. This will tell any health care providers that you have a port.  Keep all follow-up visits as told by your health care provider. This is important. Contact a health care provider if:  You cannot flush your port with saline as directed, or you cannot draw blood from the port.  You have a fever or chills.  You have redness, swelling, or pain around your port insertion site.  You have fluid or blood coming from your port insertion site.  Your port insertion site feels warm to the touch.  You have pus or a bad smell coming from the port insertion site. Get help right away if:  You have chest pain or shortness of breath.  You have bleeding from your port that you cannot control. Summary  Take care of the port as told by your   health care provider. Keep the manufacturer's information card with you at all times.  Change your dressing as told by your health care provider.  Contact a health care provider if you have a fever or chills or if you have redness, swelling, or pain around your port insertion site.  Keep all follow-up visits as told by your health care provider. This information is not intended to replace advice given to you by your health care provider. Make sure you discuss any questions you have with your health care provider. Document Revised: 06/28/2018 Document Reviewed: 06/28/2018 Elsevier Patient Education   2021 [employer]  

## 2021-04-21 NOTE — Progress Notes (Signed)
Ok to treat with elevated BP per [name], MD

## 2021-04-21 NOTE — Patient Instructions (Signed)
[facility]COL[facility]  Discharge Instructions: Thank you for choosing [facility] to provide your oncology and hematology care.   If you have a lab appointment with the [facility], please go directly to the [facility] and check in at the registration area.   Wear comfortable clothing and clothing appropriate for easy access to any Portacath or PICC line.   We strive to give you quality time with your provider. You may need to reschedule your appointment if you arrive late (15 or more minutes).  Arriving late affects you and other patients whose appointments are after yours.  Also, if you miss three or more appointments without notifying the office, you may be dismissed from the clinic at the provider's discretion.      For prescription refill requests, have your pharmacy contact our office and allow 72 hours for refills to be completed.    Today you received the following chemotherapy and/or immunotherapy agents avastin and oxaliplatin     To help prevent nausea and vomiting after your treatment, we encourage you to take your nausea medication as directed.  BELOW ARE SYMPTOMS THAT SHOULD BE REPORTED IMMEDIATELY: . *FEVER GREATER THAN 100.4 F (38 C) OR HIGHER . *CHILLS OR SWEATING . *NAUSEA AND VOMITING THAT IS NOT CONTROLLED WITH YOUR NAUSEA MEDICATION . *UNUSUAL SHORTNESS OF BREATH . *UNUSUAL BRUISING OR BLEEDING . *URINARY PROBLEMS (pain or burning when urinating, or frequent urination) . *BOWEL PROBLEMS (unusual diarrhea, constipation, pain near the anus) . TENDERNESS IN MOUTH AND THROAT WITH OR WITHOUT PRESENCE OF ULCERS (sore throat, sores in mouth, or a toothache) . UNUSUAL RASH, SWELLING OR PAIN  . UNUSUAL VAGINAL DISCHARGE OR ITCHING   Items with * indicate a potential emergency and should be followed up as soon as possible or go to the Emergency Department if any problems should occur.  Please show the CHEMOTHERAPY ALERT CARD or  IMMUNOTHERAPY ALERT CARD at check-in to the Emergency Department and triage nurse.  Should you have questions after your visit or need to cancel or reschedule your appointment, please contact [facility]  Dept: [phone]  and follow the prompts.  Office hours are 8:00 a.m. to 4:30 p.m. Monday - Friday. Please note that voicemails left after 4:00 p.m. may not be returned until the following business day.  We are closed weekends and major holidays. You have access to a nurse at all times for urgent questions. Please call the main number to the clinic Dept: [phone] and follow the prompts.   For any non-urgent questions, you may also contact your provider using MyChart. We now offer e-Visits for anyone 13 and older to request care online for non-urgent symptoms. For details visit [email or web address].   Also download the MyChart app! Go to the app store, search "MyChart", open the app, select [facility], and log in with your MyChart username and password.  Due to Covid, a mask is required upon entering the hospital/clinic. If you do not have a mask, one will be given to you upon arrival. For doctor visits, patients may have 1 support person aged 26 or older with them. For treatment visits, patients cannot have anyone with them due to current Covid guidelines and our immunocompromised population.

## 2021-04-21 NOTE — Progress Notes (Signed)
Patient tolerated dose increase of Oxaliplatin to 70mg /m2 very well. Continue Oxaliplatin 70mg /m2. Orders changed as requested.  Raul [place], [place], BCPS, BCOP 04/21/2021 12:59 PM

## 2021-04-23 ENCOUNTER — Telehealth: Payer: Self-pay | Admitting: Hematology

## 2021-04-23 NOTE — Telephone Encounter (Signed)
Scheduled follow-up appointment per 5/9 los. Patient is aware. ?

## 2021-04-24 ENCOUNTER — Telehealth: Payer: Self-pay

## 2021-04-24 ENCOUNTER — Other Ambulatory Visit: Payer: Self-pay | Admitting: Hematology

## 2021-04-24 DIAGNOSIS — C181 Malignant neoplasm of appendix: Secondary | ICD-10-CM

## 2021-04-24 NOTE — Telephone Encounter (Signed)
Reviewed the need to change CT scan to PET scan.  She verbalized understanding.  She states she has been constipated.  She has tried colace without much success.  I recommended mag citrate for acute constipation and miralax for prophylaxis.  She verbalized understanding.  She is going to start with miralax.

## 2021-04-28 ENCOUNTER — Ambulatory Visit (HOSPITAL_COMMUNITY): Payer: Medicare HMO

## 2021-04-29 ENCOUNTER — Ambulatory Visit (HOSPITAL_COMMUNITY): Payer: Medicare HMO

## 2021-04-30 NOTE — Progress Notes (Signed)
Tracy [name]   [phone] [phone]) [phone]   Clinic Follow up Note   Patient Care Team: [name], DO as PCP - General (Family Medicine) [name], MD as Consulting Physician (Hematology) Icard, Octavio Graves, DO as Consulting Physician (Pulmonary Disease) [place], P.A. [name], MD as Consulting Physician (General Surgery) [name], MD as Consulting Physician (General Surgery) [name], [name], MD as Consulting Physician (Gastroenterology)  Date of Service:  05/05/2021  CHIEF COMPLAINT: F/u of rectal cancer  SUMMARY OF ONCOLOGIC HISTORY: Oncology History Overview Note  Cancer Staging Rectal cancer [facility]) Staging form: Colon and Rectum, AJCC 8th Edition - Pathologic stage from 03/03/2020: Stage IIIB (pT4a, pN1b, cM0) - Signed by [name], MD on 01/28/2021 Stage prefix: Initial diagnosis Histologic grading system: 4 grade system Histologic grade (G): G2 Residual tumor (R): R0 - None    Rectal cancer ([place])  03/03/2020 - 04/05/2020 Hospital Admission   She was admitted to ED on 03/03/20 for abdominal pain and nausea. She had been having diarrhea for several weeks. During hospital stay she developed acute respiratory failure, AKI, RUE DVT from PICC line. Work up showed bowel perforation, small left liver mass and thickening of jejunum. She underwent emergent surgery on 03/03/20 for resection and colostomy placement. Her path showed invasive cancer, metastatic to 3/15 LNs. She had a NGT in placed but this was removed on POD 3. Post op her stoma became necrotic and septic. She had another bowel surgery on 03/12/20, which was NED with necrotic tissue.    03/03/2020 Imaging   CT AP 03/03/20  IMPRESSION: 1. Free intraperitoneal air consistent perforation. Most likely site of perforation is in the distal splenic flexure/proximal descending colon where there is a collection of air and gas measuring 6 centimeters. No obvious soft  tissue mass identified in this region. At this site, there is abrupt transition of dilated, stool-filled colon to completely decompressed proximal descending colon. 2. Thickened, inflamed loops of jejunum are identified within the pelvis and are likely reactive. 3. Small hiatal hernia. 4. Benign-appearing 1.6 centimeter mass the LEFT hepatic lobe. Recommend comparison with prior studies if available. 5.  Emphysema (ICD10-J43.9). 6.  Aortic Atherosclerosis (ICD10-I70.0). 7. Bilateral renal scarring.   03/03/2020 Surgery   low anterior resection end colostomy by Dr Marcello Moores    03/03/2020 Initial Biopsy   FINAL MICROSCOPIC DIAGNOSIS: 03/03/20 A. RECTOSIGMOID COLON, LOW ANTERIOR RESECTION:  - Invasive colonic adenocarcinoma, 3.5 cm.  - Tumor invades the visceral peritoneum.  - Margins of resection are not involved.  - Metastatic carcinoma in (3) of (15) lymph nodes.  - See oncology table.   B. ADDITIONAL SIGMOID COLON, RESECTION:  - Colonic tissue, negative for carcinoma.  ADDENDUM:  Mismatch Repair Protein (IHC)   SUMMARY INTERPRETATION: NORMAL  There is preserved expression of the major MMR proteins. There is a very  low probability that microsatellite instability (MSI) is present.  However, certain clinically significant MMR protein mutations may result  in preservation of nuclear expression. It is recommended that the  preservation of protein expression be correlated with molecular based  MSI testing.   IHC EXPRESSION RESULTS  TEST           RESULT  MLH1:          Preserved nuclear expression  MSH2:          Preserved nuclear expression  MSH6:          Preserved nuclear expression  PMS2:  Preserved nuclear expression   03/03/2020 Cancer Staging   Staging form: Colon and Rectum, AJCC 8th Edition - Pathologic stage from 03/03/2020: Stage IIIB (pT4a, pN1b, cM0) - Signed by [name], MD on 01/28/2021 Stage prefix: Initial diagnosis Histologic grading system: 4 grade  system Histologic grade (G): G2 Residual tumor (R): R0 - None   03/08/2020 Imaging   CT AP 03/08/20 IMPRESSION: 1. Interval midline laparotomy with distal colon resection and diverting left lower quadrant colostomy. 2. Diffuse small bowel dilatation with gas fluid levels most consistent with postoperative ileus. 3. Trace ascites within the abdomen and pelvis. No fluid collection or abscess at this time. Surgical drain within the lower pelvis. 4. Indeterminate 1.4 cm subcapsular liver hypodensity. In light of newly diagnosed rectal cancer, metastatic disease cannot be excluded. PET CT may be useful for further evaluation. 5. Interval development of trace bilateral pleural effusions and diffuse body wall edema.   03/12/2020 Surgery   EXPLORATORY LAPAROTOMY WITH BOWEL RESECTION AND COLOSTOMY by Dr Hassell Done 03/12/20  FINAL MICROSCOPIC DIAGNOSIS: 03/12/20  A. COLON, SPLENIC FLEXURE, RESECTION:  - Segment of colon (37 cm) with perforation and associated inflammation  - Multiple mucosal ulcers with necrotizing inflammation  - No evidence of malignancy    03/12/2020 Imaging   CT AP WO contrast 03/12/20  IMPRESSION: 1. Exam is limited by lack of intravenous contrast, motion, and artifact from the patient's arms adjacent to the torso. Since 03/08/2020, there has been development of relatively large pockets of intraperitoneal free air. While intraperitoneal gas would not be unexpected on postoperative day 9, this gas is new since an intervening study of 03/08/2020 and given the relatively large volume certainly raises concern for bowel perforation. No source for the intraperitoneal free air is evident on this study. There is a surgical drain in the pelvis, but there is no free gas around the drain itself to suggest that it represents the source. 2. New circumferential wall thickening in the splenic flexure, descending colon and sigmoid colon leading into the end  colostomy. Infection/inflammation would be a consideration. Ischemia cannot be excluded. 3. Relatively small volume intraperitoneal free fluid. High attenuation small fluid collections in the left upper abdomen may reflect hemorrhage, infection or residua from prior perforation. 4. Interval progression of diffuse body wall edema. 5. Residual contrast material in the renal parenchyma from prior imaging, compatible with renal dysfunction.    03/19/2020 Imaging   CT CAP 03/19/20  IMPRESSION: 1. 5.5 x 3.2 cm fluid collection is noted along the greater curvature of the proximal stomach. 2. Surgical drain is again noted in the pelvis with tip in left lower quadrant. 3. Interval development of crescent-shaped fluid collection measuring 16.4 x 3.3 cm in the epigastric region and left upper quadrant of the abdomen which may extend into the left lower quadrant. Potentially this may represent abscess or developing abscess. Multiple other smaller fluid collections are noted which may represent small abscesses. 4. 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 5. Mild amount of free fluid is noted in the posterior pelvis. 6. Moderate anasarca is noted. Aortic Atherosclerosis (ICD10-I70.0).   04/12/2020 Imaging   CT AP 04/12/20  IMPRESSION: 1. Fluid collections within the LEFT abdomen have significantly decreased compared to previous CT exams, now nearly completely resolved. 2. Percutaneous drainage catheter with tip coiled posterior to the LEFT kidney, stable positioning compared to the previous study. The more anterior catheter has been removed. 3. Small amount of free fluid persists within the abdomen and pelvis. 4.  Trace bibasilar pleural effusions. 5. Anasarca. 6. While reviewing today's study, comparing with a chest/abdomen/pelvis CT from earlier same month, there is question of thrombus in the LEFT internal jugular vein. Recommend ultrasound of the LEFT IJ to exclude DVT. This  recommendation discussed with patient's hospitalist, Dr. Owens Shark, on 04/12/2020 at 4:20 p.m.   Emphysema (ICD10-J43.9).   04/22/2020 Imaging   CT AP 04/22/20  IMPRESSION: 1. No recurrent intra-abdominal abscess status post interval removal of left retroperitoneal percutaneous drain. Stable small amount of free pelvic fluid. 2. Enlarging dependent bilateral pleural effusions, now moderate in volume. Associated atelectasis at both lung bases. 3. Progressive anasarca with generalized edema throughout the subcutaneous fat. 4. Stable small subcapsular fluid collection along the anterior aspect of the left hepatic lobe. 5. Aortic Atherosclerosis (ICD10-I70.0).   05/23/2020 Initial Diagnosis   Rectal cancer ([place])   01/14/2021 Imaging   CT CAP IMPRESSION: -5.0 cm soft tissue mass along the posterior aspect of the cecum at the base of the appendix, favored to reflect a peritoneal/serosal implant, corresponding to the patient's known adenocarcinoma. This is new from the prior. -Suspected additional pelvic implants along the uterus and right adnexa, poorly visualized, new/progressive from the prior. -Additional pericapsular lesion along the lateral spleen is mildly progressive, suspicious for additional peritoneal implant. -No evidence of metastatic disease in the chest.   01/27/2021 PET scan   IMPRESSION:  1. Extensive hypermetabolic peritoneal metastasis, as detailed  above.  2. No evidence of hypermetabolic supradiaphragmatic disease.  3. Diffuse low-level thyroid hypermetabolism can be seen in the  setting of thyroiditis. Consider correlation with thyroid function  labs.  4. Aortic atherosclerosis (ICD10-I70.0) and emphysema (ICD10-J43.9).    02/01/2021 -  Chemotherapy    First-line chemo Xeloda 1500 mg twice daily for day 1-14, every 21 days, started on 02/01/2021. Reduced to 1 week on/ 1 week off and added bevacizumab q2weeks from C2 on 02/24/21.  ----Given good tolerance, I changed to  CAPOX and bevacizumab q2weeks from C4 on 03/24/21 with Xeloda $RemoveBef'1500mg'FRWXffpnju$  in the AM and $Remo'1000mg'qNJcl$  in the PM 1 week on/1 week off.     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 - Polypoid lesion obliterating appendiceal orifice. Biopsied to evaluate for malignancy. - The examined portion of the ileum was normal. - Two large polyps in the cecum, not removed today pending path at appendiceal orifice, bowel prep. If removed endoscopically in the future would favor EMR to be done at the hospital. - One 5 mm polyp at the ileocecal valve, removed with a cold snare. Resected and retrieved. - One 3 mm polyp in the transverse colon, removed with a cold snare. Resected and retrieved. - Parastomal hernia making initial entry to the distal colon challenging. - The examination was otherwise normal.   01/06/2021 Initial Biopsy   Diagnosis 1. Colon, biopsy, appendiceal oraface - ADENOCARCINOMA. 2. Colon, polyp(s), ileocecal valve, transverse, x2 - TUBULAR ADENOMA, NEGATIVE FOR HIGH GRADE DYSPLASIA (X1). - COLONIC MUCOSA WITH UNDERLYING LYMPHOID AGGREGATE, NEGATIVE FOR DYSPLASIA (X1).  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 This is new from the prior. -Suspected  additional pelvic implants along the uterus and right adnexa, poorly visualized, new/progressive from the prior. -Additional pericapsular lesion along the lateral spleen is mildly progressive, suspicious for additional peritoneal implant. -No evidence of metastatic disease in the chest.      CURRENT THERAPY:    First-line chemoXeloda1500 mg twice daily for day 1-14, every 21 days, started on 02/01/2021. Reduced to 1 week on/ 1 week offand addedbevacizumab q2weeksfrom C2 on3/14/22.Given good tolerance, I changed to CAPOX and bevacizumab q2weeks from C4 on 03/24/21 with Xeloda $RemoveBef'1500mg'CzMXihAdnk$  in the AM and $Remo'1000mg'WHuLF$  in the PM 1 week on/1 week off.   INTERVAL HISTORY:  Tracy [name] is here for a follow up. She was last seen by me 04/07/21. She presents to the clinic alone. She notes she is doing well. She notes only mild fatigue and cold sensitivity. She was able to recover on her off week. She notes she is eating adequately. I reviewed her medication list with her. She denies recent fever.    REVIEW OF SYSTEMS:   Constitutional: Denies fevers, chills or abnormal weight loss Eyes: Denies blurriness of vision Ears, nose, mouth, throat, and face: Denies mucositis or sore throat Respiratory: Denies cough, dyspnea or wheezes Cardiovascular: Denies palpitation, chest discomfort or lower extremity swelling Gastrointestinal:  Denies nausea, heartburn or change in bowel habits Skin: Denies abnormal skin rashes Lymphatics: Denies new lymphadenopathy or easy bruising Neurological:Denies numbness, tingling or new weaknesses Behavioral/Psych: Mood is stable, no new changes  All other systems were reviewed with the patient and are negative.  MEDICAL HISTORY:  Past Medical History:  Diagnosis Date  . Acute deep vein thrombosis (DVT) of brachial vein of right upper extremity ([place])   . Acute on chronic respiratory failure with hypoxia ([place])   . Asthma   . Cataract   . [place], HX OF 01/06/2011   Qualifier: Diagnosis of  By: Charlett Blake MD, Erline Levine    . COPD (chronic obstructive pulmonary disease) ([place]) 2011   FeV1 31% predicted FeV1/FVX 47 %  . Essential hypertension 05/23/2020  . Hemorrhoid   . Open right radial fracture 2020  . Osteopenia   . Perforated sigmoid colon ([place]) 03/03/2020  . Pleural effusion   .  Pneumonia 2021  . Postoperative intra-abdominal abscess 2021  . Rectal cancer ([place])   . Seasonal allergies    takes Claritin daily prn  . Shock circulatory ([place])   . Tracheostomy status ([place])   . Vitamin D deficiency    takes Vit d every 14 days    SURGICAL HISTORY: Past Surgical History:  Procedure Laterality Date  . AUGMENTATION MAMMAPLASTY     saline  . EXAMINATION UNDER ANESTHESIA  10/14/2012   Procedure: EXAM UNDER ANESTHESIA;  Surgeon: Gayland Curry, MD,FACS;  Location: [facility];  Service: General;  Laterality: N/A;  rectal exam under anesthesia excisional hemorrhoidectomy hemorrhoidal banding x two  . EXAMINATION UNDER ANESTHESIA  02/03/2013   excision hemorrhoidal tissue  . FOOT SURGERY     left bunionectomy  . HEMORRHOIDECTOMY WITH HEMORRHOID BANDING  10/14/2012   Procedure: HEMORRHOIDECTOMY WITH HEMORRHOID BANDING;  Surgeon: Gayland Curry, MD,FACS;  Location: [facility];  Service: General;  Laterality: N/A;  . IR IMAGING GUIDED PORT INSERTION  02/03/2021  . IR THORACENTESIS ASP PLEURAL SPACE W/IMG GUIDE  04/23/2020  . LAPAROTOMY N/A 03/03/2020   Procedure: low anterior resection end colostomy;  Surgeon: [name], MD;  Location: [facility];  Service: General;  Laterality: N/A;  . LAPAROTOMY N/A 03/12/2020   Procedure:  EXPLORATORY LAPAROTOMY WITH BOWEL RESECTION AND COLOSTOMY;  Surgeon: [name], MD;  Location: [facility];  Service: General;  Laterality: N/A;  . OPEN REDUCTION INTERNAL FIXATION (ORIF) DISTAL RADIAL FRACTURE Right 11/01/2018   Procedure: OPEN REDUCTION INTERNAL FIXATION (ORIF) DISTAL RADIAL FRACTURE;  Surgeon: [name], MD;  Location: [facility];  Service: Orthopedics;  Laterality: Right;  . TONSILLECTOMY  1960   recurrent otitis media  . US ECHOCARDIOGRAPHY  02/2020    poor windows, normal LV function, severely dilated RV with moderately reduced function, RV volume and pressure overload, mildly dilated RA    I have reviewed the social history  and family history with the patient and they are unchanged from previous note.  ALLERGIES:  is allergic to amlodipine and codeine.  MEDICATIONS:  Current Outpatient Medications  Medication Sig Dispense Refill  . albuterol (PROVENTIL) (2.5 MG/3ML) 0.083% nebulizer solution Take 3 mLs (2.5 mg total) by nebulization every 4 (four) hours as needed for wheezing or shortness of breath. DX: J44.9 360 mL 5  . albuterol (VENTOLIN HFA) 108 (90 Base) MCG/ACT inhaler Inhale 2 puffs into the lungs every 6 (six) hours as needed for wheezing or shortness of breath. 8 g 5  . ALPRAZolam (XANAX) 0.25 MG tablet Take 1 tablet (0.25 mg total) by mouth at bedtime as needed for anxiety. 30 tablet 0  . capecitabine (XELODA) 500 MG tablet Take 3 tablets (1,500 mg total) by mouth 2 (two) times daily after a meal. Take for 7 days on, 7 days off, repeat every 14 days. 84 tablet 1  . diphenhydrAMINE (BENADRYL) 25 mg capsule Take 25 mg by mouth every 6 (six) hours as needed.    . docusate sodium (COLACE) 50 MG capsule Take 50 mg by mouth daily as needed for mild constipation.    . fluticasone (FLONASE) 50 MCG/ACT nasal spray Place 2 sprays into both nostrils daily. 16 mL 5  . Fluticasone-Umeclidin-Vilant (TRELEGY ELLIPTA) 100-62.5-25 MCG/INH AEPB Inhale 1 puff into the lungs daily. 60 each 2  . lidocaine-prilocaine (EMLA) cream Apply 1 application topically as needed. 30 g 0  . ondansetron (ZOFRAN) 8 MG tablet Take 1 tablet (8 mg total) by mouth every 8 (eight) hours as needed for nausea or vomiting. 20 tablet 2  . traMADol (ULTRAM) 50 MG tablet Take 1 tablet (50 mg total) by mouth every 6 (six) hours as needed. 30 tablet 0   No current facility-administered medications for this visit.    PHYSICAL EXAMINATION: ECOG PERFORMANCE STATUS: 2 - Symptomatic, <50% confined to bed  Vitals:   05/05/21 1111  BP: 131/81  Pulse: 64  Resp: 16  Temp: 97.8 F (36.6 C)  SpO2: 98%   Filed Weights   05/05/21 1111  Weight: 131 lb  9.6 oz (59.7 kg)    GENERAL:alert, no distress and comfortable SKIN: skin color, texture, turgor are normal, no rashes or significant lesions EYES: normal, Conjunctiva are pink and non-injected, sclera clear  NECK: supple, thyroid normal size, non-tender, without nodularity LYMPH:  no palpable lymphadenopathy in the cervical, axillary  LUNGS: clear to auscultation and percussion with normal breathing effort HEART: regular rate & rhythm and no murmurs and no lower extremity edema ABDOMEN:abdomen soft, non-tender and normal bowel sounds (+) Left colostomy bag in place, clean (+) Abdominal hernia Musculoskeletal:no cyanosis of digits and no clubbing  NEURO: alert & oriented x 3 with fluent speech, no focal motor/sensory deficits  LABORATORY DATA:  I have reviewed the data as listed CBC Latest  Ref Rng & Units 05/05/2021 04/21/2021 04/07/2021  WBC 4.0 - 10.5 K/uL 13.6(H) 8.9 8.2  Hemoglobin 12.0 - 15.0 g/dL 12.1 12.2 12.6  Hematocrit 36.0 - 46.0 % 36.0 37.1 37.8  Platelets 150 - 400 K/uL 255 256 245     CMP Latest Ref Rng & Units 04/21/2021 04/07/2021 03/24/2021  Glucose 70 - 99 mg/dL 94 96 90  BUN 8 - 23 mg/dL 15 19 14   Creatinine 0.44 - 1.00 mg/dL 0.94 1.00 1.03(H)  Sodium 135 - 145 mmol/L 141 140 138  Potassium 3.5 - 5.1 mmol/L 3.6 4.4 4.4  Chloride 98 - 111 mmol/L 105 103 103  CO2 22 - 32 mmol/L 24 25 23   Calcium 8.9 - 10.3 mg/dL 9.0 9.2 9.0  Total Protein 6.5 - 8.1 g/dL 6.7 6.8 7.2  Total Bilirubin 0.3 - 1.2 mg/dL 0.5 0.6 0.6  Alkaline Phos 38 - 126 U/L 103 96 102  AST 15 - 41 U/L 31 28 29   ALT 0 - 44 U/L 22 16 16       RADIOGRAPHIC STUDIES: I have personally reviewed the radiological images as listed and agreed with the findings in the report. No results found.   ASSESSMENT & PLAN:  Tracy [name] is a 70 y.o. female with    1. Rectosigmoid cancer(perforated),Stage III,pT4aN1b,with peroration,MSI Stable, peritoneal metastasis in 01/2021 -She was diagnosed in  02/2020.Workup showed she had bowel perforation and underwent emergent surgery. Pathology showed invasiverectosigmoidadenocarcinoma and perforatedbowel with3/15positiveLN.Margins were clear. She haspermanentcolostomy bag in place. Her initial CT scan was negative for distant mets. -She hasstage III colon cancer and with bowel perforation she hasveryhigh risk of cancer recurrence,unfortunately she was not a candidate for adjuvant chemo after surgery due to very slow recovery.  -She did opt to proceed withGuardant Revealstudy in 06/06/20, 07/12/20 and 9/28/21which were allnegative.  -Unfortunately her screening colonoscopy on January 06, 2021 which showed a mass in theappendiceal orificeandbiopsy confirmed adenocarcinoma.01/27/21 PET showedtumor inappendiceal orificeis likely peritoneal met growing into cecum.  -Her peritoneal metastasis is mainly in the pelvis, difficult to biopsy.Her PET scan ispretty convincing for metastatic disease, I do not strongly feel she needs biopsy to confirm. -I started her on First-line chemoXeloda1500 mg twice daily for day 1-14, every 21 days, started on 02/01/2021. Reduced to 1 week on/ 1 week offand addedbevacizumab q2weeksfrom C2 on 02/24/21. Given good tolerance, I changed to CAPOX and bevacizumab q2weeks from C4 on 03/24/21 with Xeloda 1500mg  in the AM and 1000mg  in the PM, 1 week on/1 week off.  -Next PET scan on 05/08/21. I will call her with results.  -She continues to tolerate treatment well so far with manageable fatigue and mild cold sensitivity. Labs reviewed, WBC 13.6, ANC 10.5. Overall adequate to proceed with C4 Oxaliplatin and Beva today.  -She will start Xeloda today at 1500mg  in the AM and 1000mg  in the PM, 1 week on/1 week off -F/u in 2 weeks.   2.ProvokedRUE DVTon 04/04/20. S/p 3 months of Eliquis  3. Comorbidities: Asthma, COPD, Anxiety -On nebulizer, Cymbalta andXanax 0.25mg  1-2 daily, Benadryl and Flonase  -Continue  to f/u with pulmonologist and she can request rescue inhaler from them.   4.Goal of care discussion  -The patient understands the goal of care is palliative. -she is full code for now  5. Abdominal hernia  -S/p colon surgery, she has abdominal hernia -I discussed surgery would be an option, if not for her metastatic colon cancer. I recommend she avoid heavy lifting    PLAN: -Labs  reviewed and adequate to proceed with Beva and Oxaliplatin today at same dose  -Start Xeloda today at $Remove'1500mg'uOHgYXL$  in the AM and $Remo'1000mg'ZXWXE$  in the PM, 1 week on/1 week off -Lab, flush, f/u, Oxaliplatin and Beva in 2 and 4 weeks  -PET scan on 05/08/21, I will call her with results   No problem-specific Assessment & Plan notes found for this encounter.   No orders of the defined types were placed in this encounter.  All questions were answered. The patient knows to call the clinic with any problems, questions or concerns. No barriers to learning was detected. The total time spent in the appointment was 30 minutes.     [name], MD 05/05/2021   I, Joslyn Devon, am acting as scribe for [name], MD.   I have reviewed the above documentation for accuracy and completeness, and I agree with the above.

## 2021-05-05 ENCOUNTER — Inpatient Hospital Stay: Payer: Medicare HMO

## 2021-05-05 ENCOUNTER — Inpatient Hospital Stay: Payer: Medicare HMO | Admitting: Hematology

## 2021-05-05 ENCOUNTER — Encounter: Payer: Self-pay | Admitting: Hematology

## 2021-05-05 ENCOUNTER — Other Ambulatory Visit: Payer: Self-pay

## 2021-05-05 VITALS — BP 134/66 | HR 76

## 2021-05-05 VITALS — BP 131/81 | HR 64 | Temp 97.8°F | Resp 16 | Ht 62.0 in | Wt 131.6 lb

## 2021-05-05 DIAGNOSIS — C20 Malignant neoplasm of rectum: Secondary | ICD-10-CM | POA: Diagnosis not present

## 2021-05-05 DIAGNOSIS — I82621 Acute embolism and thrombosis of deep veins of right upper extremity: Secondary | ICD-10-CM

## 2021-05-05 DIAGNOSIS — Z95828 Presence of other vascular implants and grafts: Secondary | ICD-10-CM

## 2021-05-05 DIAGNOSIS — R69 Illness, unspecified: Secondary | ICD-10-CM | POA: Diagnosis not present

## 2021-05-05 DIAGNOSIS — Z5112 Encounter for antineoplastic immunotherapy: Secondary | ICD-10-CM | POA: Diagnosis not present

## 2021-05-05 DIAGNOSIS — C786 Secondary malignant neoplasm of retroperitoneum and peritoneum: Secondary | ICD-10-CM | POA: Diagnosis not present

## 2021-05-05 DIAGNOSIS — J449 Chronic obstructive pulmonary disease, unspecified: Secondary | ICD-10-CM | POA: Diagnosis not present

## 2021-05-05 DIAGNOSIS — Z933 Colostomy status: Secondary | ICD-10-CM | POA: Diagnosis not present

## 2021-05-05 DIAGNOSIS — Z5111 Encounter for antineoplastic chemotherapy: Secondary | ICD-10-CM | POA: Diagnosis not present

## 2021-05-05 DIAGNOSIS — C19 Malignant neoplasm of rectosigmoid junction: Secondary | ICD-10-CM | POA: Diagnosis not present

## 2021-05-05 DIAGNOSIS — Z79899 Other long term (current) drug therapy: Secondary | ICD-10-CM | POA: Diagnosis not present

## 2021-05-05 DIAGNOSIS — Z7901 Long term (current) use of anticoagulants: Secondary | ICD-10-CM | POA: Diagnosis not present

## 2021-05-05 DIAGNOSIS — Z86718 Personal history of other venous thrombosis and embolism: Secondary | ICD-10-CM | POA: Diagnosis not present

## 2021-05-05 LAB — CMP (CANCER CENTER ONLY)
ALT: 12 U/L (ref 0–44)
AST: 18 U/L (ref 15–41)
Albumin: 3.4 g/dL — ABNORMAL LOW (ref 3.5–5.0)
Alkaline Phosphatase: 97 U/L (ref 38–126)
Anion gap: 11 (ref 5–15)
BUN: 15 mg/dL (ref 8–23)
CO2: 26 mmol/L (ref 22–32)
Calcium: 9.4 mg/dL (ref 8.9–10.3)
Chloride: 103 mmol/L (ref 98–111)
Creatinine: 0.86 mg/dL (ref 0.44–1.00)
GFR, Estimated: >60 mL/min
Glucose, Bld: 108 mg/dL — ABNORMAL HIGH (ref 70–99)
Potassium: 4.3 mmol/L (ref 3.5–5.1)
Sodium: 140 mmol/L (ref 135–145)
Total Bilirubin: 0.7 mg/dL (ref 0.3–1.2)
Total Protein: 6.8 g/dL (ref 6.5–8.1)

## 2021-05-05 LAB — CBC WITH DIFFERENTIAL (CANCER CENTER ONLY)
Abs Immature Granulocytes: 0.04 10*3/uL (ref 0.00–0.07)
Basophils Absolute: 0.1 10*3/uL (ref 0.0–0.1)
Basophils Relative: 0 %
Eosinophils Absolute: 0 10*3/uL (ref 0.0–0.5)
Eosinophils Relative: 0 %
HCT: 36 % (ref 36.0–46.0)
Hemoglobin: 12.1 g/dL (ref 12.0–15.0)
Immature Granulocytes: 0 %
Lymphocytes Relative: 11 %
Lymphs Abs: 1.5 10*3/uL (ref 0.7–4.0)
MCH: 34.6 pg — ABNORMAL HIGH (ref 26.0–34.0)
MCHC: 33.6 g/dL (ref 30.0–36.0)
MCV: 102.9 fL — ABNORMAL HIGH (ref 80.0–100.0)
Monocytes Absolute: 1.4 10*3/uL — ABNORMAL HIGH (ref 0.1–1.0)
Monocytes Relative: 10 %
Neutro Abs: 10.5 10*3/uL — ABNORMAL HIGH (ref 1.7–7.7)
Neutrophils Relative %: 79 %
Platelet Count: 255 10*3/uL (ref 150–400)
RBC: 3.5 MIL/uL — ABNORMAL LOW (ref 3.87–5.11)
RDW: 14.9 % (ref 11.5–15.5)
WBC Count: 13.6 10*3/uL — ABNORMAL HIGH (ref 4.0–10.5)
nRBC: 0 % (ref 0.0–0.2)

## 2021-05-05 LAB — IRON AND TIBC
Iron: 60 ug/dL (ref 41–142)
Saturation Ratios: 17 % — ABNORMAL LOW (ref 21–57)
TIBC: 360 ug/dL (ref 236–444)
UIBC: 300 ug/dL (ref 120–384)

## 2021-05-05 LAB — CEA (IN HOUSE-CHCC): CEA (CHCC-In House): 2.28 ng/mL (ref 0.00–5.00)

## 2021-05-05 LAB — FERRITIN: Ferritin: 183 ng/mL (ref 11–307)

## 2021-05-05 LAB — TOTAL PROTEIN, URINE DIPSTICK: Protein, ur: 30 mg/dL — AB

## 2021-05-05 MED ORDER — HEPARIN SOD (PORK) LOCK FLUSH 100 UNIT/ML IV SOLN
500.0000 [IU] | Freq: Once | INTRAVENOUS | Status: DC
  Filled 2021-05-05: qty 5

## 2021-05-05 MED ORDER — DEXTROSE 5 % IV SOLN
Freq: Once | INTRAVENOUS | Status: AC
  Filled 2021-05-05: qty 250

## 2021-05-05 MED ORDER — SODIUM CHLORIDE 0.9% FLUSH
10.0000 mL | INTRAVENOUS | Status: DC | PRN
  Administered 2021-05-05: 10 mL
  Filled 2021-05-05: qty 10

## 2021-05-05 MED ORDER — PALONOSETRON HCL INJECTION 0.25 MG/5ML
INTRAVENOUS | Status: AC
  Filled 2021-05-05: qty 5

## 2021-05-05 MED ORDER — SODIUM CHLORIDE 0.9 % IV SOLN
5.0000 mg/kg | Freq: Once | INTRAVENOUS | Status: AC
  Administered 2021-05-05: 300 mg via INTRAVENOUS
  Filled 2021-05-05: qty 12

## 2021-05-05 MED ORDER — SODIUM CHLORIDE 0.9% FLUSH
10.0000 mL | INTRAVENOUS | Status: DC | PRN
  Administered 2021-05-05: 10 mL via INTRAVENOUS
  Filled 2021-05-05: qty 10

## 2021-05-05 MED ORDER — SODIUM CHLORIDE 0.9 % IV SOLN
10.0000 mg | Freq: Once | INTRAVENOUS | Status: AC
  Administered 2021-05-05: 10 mg via INTRAVENOUS
  Filled 2021-05-05: qty 10

## 2021-05-05 MED ORDER — OXALIPLATIN CHEMO INJECTION 100 MG/20ML
70.0000 mg/m2 | Freq: Once | INTRAVENOUS | Status: AC
  Administered 2021-05-05: 115 mg via INTRAVENOUS
  Filled 2021-05-05: qty 23

## 2021-05-05 MED ORDER — PALONOSETRON HCL INJECTION 0.25 MG/5ML
0.2500 mg | Freq: Once | INTRAVENOUS | Status: AC
  Administered 2021-05-05: 0.25 mg via INTRAVENOUS

## 2021-05-05 MED ORDER — SODIUM CHLORIDE 0.9 % IV SOLN
Freq: Once | INTRAVENOUS | Status: AC
Start: 2021-05-05 — End: 2021-05-05
  Filled 2021-05-05: qty 250

## 2021-05-05 MED ORDER — HEPARIN SOD (PORK) LOCK FLUSH 100 UNIT/ML IV SOLN
500.0000 [IU] | Freq: Once | INTRAVENOUS | Status: AC | PRN
  Administered 2021-05-05: 500 [IU]
  Filled 2021-05-05: qty 5

## 2021-05-05 NOTE — Patient Instructions (Signed)
[facility]  Discharge Instructions: Thank you for choosing [facility] to provide your oncology and hematology care.   If you have a lab appointment with the Cancer Center, please go directly to the Cancer Center and check in at the registration area.   Wear comfortable clothing and clothing appropriate for easy access to any Portacath or PICC line.   We strive to give you quality time with your provider. You may need to reschedule your appointment if you arrive late (15 or more minutes).  Arriving late affects you and other patients whose appointments are after yours.  Also, if you miss three or more appointments without notifying the office, you may be dismissed from the clinic at the provider's discretion.      For prescription refill requests, have your pharmacy contact our office and allow 72 hours for refills to be completed.    Today you received the following chemotherapy and/or immunotherapy agents: Bevacizumab and Oxaliplatin      To help prevent nausea and vomiting after your treatment, we encourage you to take your nausea medication as directed.  BELOW ARE SYMPTOMS THAT SHOULD BE REPORTED IMMEDIATELY: . *FEVER GREATER THAN 100.4 F (38 C) OR HIGHER . *CHILLS OR SWEATING . *NAUSEA AND VOMITING THAT IS NOT CONTROLLED WITH YOUR NAUSEA MEDICATION . *UNUSUAL SHORTNESS OF BREATH . *UNUSUAL BRUISING OR BLEEDING . *URINARY PROBLEMS (pain or burning when urinating, or frequent urination) . *BOWEL PROBLEMS (unusual diarrhea, constipation, pain near the anus) . TENDERNESS IN MOUTH AND THROAT WITH OR WITHOUT PRESENCE OF ULCERS (sore throat, sores in mouth, or a toothache) . UNUSUAL RASH, SWELLING OR PAIN  . UNUSUAL VAGINAL DISCHARGE OR ITCHING   Items with * indicate a potential emergency and should be followed up as soon as possible or go to the Emergency Department if any problems should occur.  Please show the CHEMOTHERAPY ALERT CARD or  IMMUNOTHERAPY ALERT CARD at check-in to the Emergency Department and triage nurse.  Should you have questions after your visit or need to cancel or reschedule your appointment, please contact [facility]  Dept: [phone]  and follow the prompts.  Office hours are 8:00 a.m. to 4:30 p.m. Monday - Friday. Please note that voicemails left after 4:00 p.m. may not be returned until the following business day.  We are closed weekends and major holidays. You have access to a nurse at all times for urgent questions. Please call the main number to the clinic Dept: [phone] and follow the prompts.   For any non-urgent questions, you may also contact your provider using MyChart. We now offer e-Visits for anyone 18 and older to request care online for non-urgent symptoms. For details visit [email or web address].   Also download the MyChart app! Go to the app store, search "MyChart", open the app, select , and log in with your MyChart username and password.  Due to Covid, a mask is required upon entering the hospital/clinic. If you do not have a mask, one will be given to you upon arrival. For doctor visits, patients may have 1 support person aged 18 or older with them. For treatment visits, patients cannot have anyone with them due to current Covid guidelines and our immunocompromised population.   

## 2021-05-05 NOTE — Patient Instructions (Signed)
Implanted Port Insertion, Care After This sheet gives you information about how to care for yourself after your procedure. Your health care provider may also give you more specific instructions. If you have problems or questions, contact your health care provider. What can I expect after the procedure? After the procedure, it is common to have:  Discomfort at the port insertion site.  Bruising on the skin over the port. This should improve over 3-4 days. Follow these instructions at home: Port care  After your port is placed, you will get a manufacturer's information card. The card has information about your port. Keep this card with you at all times.  Take care of the port as told by your health care provider. Ask your health care provider if you or a family member can get training for taking care of the port at home. A home health care nurse may also take care of the port.  Make sure to remember what type of port you have. Incision care  Follow instructions from your health care provider about how to take care of your port insertion site. Make sure you: ? Wash your hands with soap and water before and after you change your bandage (dressing). If soap and water are not available, use hand sanitizer. ? Change your dressing as told by your health care provider. ? Leave stitches (sutures), skin glue, or adhesive strips in place. These skin closures may need to stay in place for 2 weeks or longer. If adhesive strip edges start to loosen and curl up, you may trim the loose edges. Do not remove adhesive strips completely unless your health care provider tells you to do that.  Check your port insertion site every day for signs of infection. Check for: ? Redness, swelling, or pain. ? Fluid or blood. ? Warmth. ? Pus or a bad smell.      Activity  Return to your normal activities as told by your health care provider. Ask your health care provider what activities are safe for you.  Do not  lift anything that is heavier than 10 lb (4.5 kg), or the limit that you are told, until your health care provider says that it is safe. General instructions  Take over-the-counter and prescription medicines only as told by your health care provider.  Do not take baths, swim, or use a hot tub until your health care provider approves. Ask your health care provider if you may take showers. You may only be allowed to take sponge baths.  Do not drive for 24 hours if you were given a sedative during your procedure.  Wear a medical alert bracelet in case of an emergency. This will tell any health care providers that you have a port.  Keep all follow-up visits as told by your health care provider. This is important. Contact a health care provider if:  You cannot flush your port with saline as directed, or you cannot draw blood from the port.  You have a fever or chills.  You have redness, swelling, or pain around your port insertion site.  You have fluid or blood coming from your port insertion site.  Your port insertion site feels warm to the touch.  You have pus or a bad smell coming from the port insertion site. Get help right away if:  You have chest pain or shortness of breath.  You have bleeding from your port that you cannot control. Summary  Take care of the port as told by your   health care provider. Keep the manufacturer's information card with you at all times.  Change your dressing as told by your health care provider.  Contact a health care provider if you have a fever or chills or if you have redness, swelling, or pain around your port insertion site.  Keep all follow-up visits as told by your health care provider. This information is not intended to replace advice given to you by your health care provider. Make sure you discuss any questions you have with your health care provider. Document Revised: 06/28/2018 Document Reviewed: 06/28/2018 Elsevier Patient Education   2021 [employer]  

## 2021-05-08 ENCOUNTER — Other Ambulatory Visit: Payer: Self-pay

## 2021-05-08 ENCOUNTER — Ambulatory Visit (HOSPITAL_COMMUNITY)
Admission: RE | Admit: 2021-05-08 | Discharge: 2021-05-08 | Disposition: A | Discharge status: Home / Self Care | Payer: Medicare HMO | Source: Ambulatory Visit | Attending: Hematology | Admitting: Hematology

## 2021-05-08 DIAGNOSIS — C181 Malignant neoplasm of appendix: Secondary | ICD-10-CM | POA: Insufficient documentation

## 2021-05-08 DIAGNOSIS — Z79899 Other long term (current) drug therapy: Secondary | ICD-10-CM | POA: Diagnosis not present

## 2021-05-08 DIAGNOSIS — C19 Malignant neoplasm of rectosigmoid junction: Secondary | ICD-10-CM | POA: Diagnosis not present

## 2021-05-08 LAB — GLUCOSE, CAPILLARY: Glucose-Capillary: 99 mg/dL (ref 70–99)

## 2021-05-08 MED ORDER — FLUDEOXYGLUCOSE F - 18 (FDG) INJECTION
7.0000 | Freq: Once | INTRAVENOUS | Status: AC | PRN
  Administered 2021-05-08: 6.5 via INTRAVENOUS

## 2021-05-14 DIAGNOSIS — U071 COVID-19: Secondary | ICD-10-CM

## 2021-05-14 HISTORY — DX: COVID-19: U07.1

## 2021-05-14 NOTE — Progress Notes (Signed)
[place]   Telephone:(336) [phone] Fax:(336) [phone]   Clinic Follow up Note   Patient Care Team: [name], DO as PCP - General (Family Medicine) [name], MD as Consulting Physician (Hematology) Icard, Octavio Graves, DO as Consulting Physician (Pulmonary Disease) [facility] [name], MD as Consulting Physician (General Surgery) [name], MD as Consulting Physician (General Surgery) [name], [name], MD as Consulting Physician (Gastroenterology)  Date of Service:  05/19/2021  CHIEF COMPLAINT: F/u of rectal cancer  SUMMARY OF ONCOLOGIC HISTORY: Oncology History Overview Note  Cancer Staging Rectal cancer [facility]) Staging form: Colon and Rectum, AJCC 8th Edition - Pathologic stage from 03/03/2020: Stage IIIB (pT4a, pN1b, cM0) - Signed by [name], MD on 01/28/2021 Stage prefix: Initial diagnosis Histologic grading system: 4 grade system Histologic grade (G): G2 Residual tumor (R): R0 - None    Rectal cancer ([place])  03/03/2020 - 04/05/2020 Hospital Admission   She was admitted to ED on 03/03/20 for abdominal pain and nausea. She had been having diarrhea for several weeks. During hospital stay she developed acute respiratory failure, AKI, RUE DVT from PICC line. Work up showed bowel perforation, small left liver mass and thickening of jejunum. She underwent emergent surgery on 03/03/20 for resection and colostomy placement. Her path showed invasive cancer, metastatic to 3/15 LNs. She had a NGT in placed but this was removed on POD 3. Post op her stoma became necrotic and septic. She had another bowel surgery on 03/12/20, which was NED with necrotic tissue.    03/03/2020 Imaging   CT AP 03/03/20  IMPRESSION: 1. Free intraperitoneal air consistent perforation. Most likely site of perforation is in the distal splenic flexure/proximal descending colon where there is a collection of air and gas measuring 6 centimeters. No obvious soft  tissue mass identified in this region. At this site, there is abrupt transition of dilated, stool-filled colon to completely decompressed proximal descending colon. 2. Thickened, inflamed loops of jejunum are identified within the pelvis and are likely reactive. 3. Small hiatal hernia. 4. Benign-appearing 1.6 centimeter mass the LEFT hepatic lobe. Recommend comparison with prior studies if available. 5.  Emphysema (ICD10-J43.9). 6.  Aortic Atherosclerosis (ICD10-I70.0). 7. Bilateral renal scarring.   03/03/2020 Surgery   low anterior resection end colostomy by Dr Marcello Moores    03/03/2020 Initial Biopsy   FINAL MICROSCOPIC DIAGNOSIS: 03/03/20 A. RECTOSIGMOID COLON, LOW ANTERIOR RESECTION:  - Invasive colonic adenocarcinoma, 3.5 cm.  - Tumor invades the visceral peritoneum.  - Margins of resection are not involved.  - Metastatic carcinoma in (3) of (15) lymph nodes.  - See oncology table.   B. ADDITIONAL SIGMOID COLON, RESECTION:  - Colonic tissue, negative for carcinoma.  ADDENDUM:  Mismatch Repair Protein (IHC)   SUMMARY INTERPRETATION: NORMAL  There is preserved expression of the major MMR proteins. There is a very  low probability that microsatellite instability (MSI) is present.  However, certain clinically significant MMR protein mutations may result  in preservation of nuclear expression. It is recommended that the  preservation of protein expression be correlated with molecular based  MSI testing.   IHC EXPRESSION RESULTS  TEST           RESULT  MLH1:          Preserved nuclear expression  MSH2:          Preserved nuclear expression  MSH6:          Preserved nuclear expression  PMS2:  Preserved nuclear expression   03/03/2020 Cancer Staging   Staging form: Colon and Rectum, AJCC 8th Edition - Pathologic stage from 03/03/2020: Stage IIIB (pT4a, pN1b, cM0) - Signed by [name], MD on 01/28/2021 Stage prefix: Initial diagnosis Histologic grading system: 4 grade  system Histologic grade (G): G2 Residual tumor (R): R0 - None   03/08/2020 Imaging   CT AP 03/08/20 IMPRESSION: 1. Interval midline laparotomy with distal colon resection and diverting left lower quadrant colostomy. 2. Diffuse small bowel dilatation with gas fluid levels most consistent with postoperative ileus. 3. Trace ascites within the abdomen and pelvis. No fluid collection or abscess at this time. Surgical drain within the lower pelvis. 4. Indeterminate 1.4 cm subcapsular liver hypodensity. In light of newly diagnosed rectal cancer, metastatic disease cannot be excluded. PET CT may be useful for further evaluation. 5. Interval development of trace bilateral pleural effusions and diffuse body wall edema.   03/12/2020 Surgery   EXPLORATORY LAPAROTOMY WITH BOWEL RESECTION AND COLOSTOMY by Dr Hassell Done 03/12/20  FINAL MICROSCOPIC DIAGNOSIS: 03/12/20  A. COLON, SPLENIC FLEXURE, RESECTION:  - Segment of colon (37 cm) with perforation and associated inflammation  - Multiple mucosal ulcers with necrotizing inflammation  - No evidence of malignancy    03/12/2020 Imaging   CT AP WO contrast 03/12/20  IMPRESSION: 1. Exam is limited by lack of intravenous contrast, motion, and artifact from the patient's arms adjacent to the torso. Since 03/08/2020, there has been development of relatively large pockets of intraperitoneal free air. While intraperitoneal gas would not be unexpected on postoperative day 9, this gas is new since an intervening study of 03/08/2020 and given the relatively large volume certainly raises concern for bowel perforation. No source for the intraperitoneal free air is evident on this study. There is a surgical drain in the pelvis, but there is no free gas around the drain itself to suggest that it represents the source. 2. New circumferential wall thickening in the splenic flexure, descending colon and sigmoid colon leading into the end  colostomy. Infection/inflammation would be a consideration. Ischemia cannot be excluded. 3. Relatively small volume intraperitoneal free fluid. High attenuation small fluid collections in the left upper abdomen may reflect hemorrhage, infection or residua from prior perforation. 4. Interval progression of diffuse body wall edema. 5. Residual contrast material in the renal parenchyma from prior imaging, compatible with renal dysfunction.    03/19/2020 Imaging   CT CAP 03/19/20  IMPRESSION: 1. 5.5 x 3.2 cm fluid collection is noted along the greater curvature of the proximal stomach. 2. Surgical drain is again noted in the pelvis with tip in left lower quadrant. 3. Interval development of crescent-shaped fluid collection measuring 16.4 x 3.3 cm in the epigastric region and left upper quadrant of the abdomen which may extend into the left lower quadrant. Potentially this may represent abscess or developing abscess. Multiple other smaller fluid collections are noted which may represent small abscesses. 4. 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 5. Mild amount of free fluid is noted in the posterior pelvis. 6. Moderate anasarca is noted. Aortic Atherosclerosis (ICD10-I70.0).   04/12/2020 Imaging   CT AP 04/12/20  IMPRESSION: 1. Fluid collections within the LEFT abdomen have significantly decreased compared to previous CT exams, now nearly completely resolved. 2. Percutaneous drainage catheter with tip coiled posterior to the LEFT kidney, stable positioning compared to the previous study. The more anterior catheter has been removed. 3. Small amount of free fluid persists within the abdomen and pelvis. 4.  Trace bibasilar pleural effusions. 5. Anasarca. 6. While reviewing today's study, comparing with a chest/abdomen/pelvis CT from earlier same month, there is question of thrombus in the LEFT internal jugular vein. Recommend ultrasound of the LEFT IJ to exclude DVT. This  recommendation discussed with patient's hospitalist, Dr. Owens Shark, on 04/12/2020 at 4:20 p.m.   Emphysema (ICD10-J43.9).   04/22/2020 Imaging   CT AP 04/22/20  IMPRESSION: 1. No recurrent intra-abdominal abscess status post interval removal of left retroperitoneal percutaneous drain. Stable small amount of free pelvic fluid. 2. Enlarging dependent bilateral pleural effusions, now moderate in volume. Associated atelectasis at both lung bases. 3. Progressive anasarca with generalized edema throughout the subcutaneous fat. 4. Stable small subcapsular fluid collection along the anterior aspect of the left hepatic lobe. 5. Aortic Atherosclerosis (ICD10-I70.0).   05/23/2020 Initial Diagnosis   Rectal cancer ([place])   01/14/2021 Imaging   CT CAP IMPRESSION: -5.0 cm soft tissue mass along the posterior aspect of the cecum at the base of the appendix, favored to reflect a peritoneal/serosal implant, corresponding to the patient's known adenocarcinoma. This is new from the prior. -Suspected additional pelvic implants along the uterus and right adnexa, poorly visualized, new/progressive from the prior. -Additional pericapsular lesion along the lateral spleen is mildly progressive, suspicious for additional peritoneal implant. -No evidence of metastatic disease in the chest.   01/27/2021 PET scan   IMPRESSION:  1. Extensive hypermetabolic peritoneal metastasis, as detailed  above.  2. No evidence of hypermetabolic supradiaphragmatic disease.  3. Diffuse low-level thyroid hypermetabolism can be seen in the  setting of thyroiditis. Consider correlation with thyroid function  labs.  4. Aortic atherosclerosis (ICD10-I70.0) and emphysema (ICD10-J43.9).    02/01/2021 -  Chemotherapy    First-line chemo Xeloda 1500 mg twice daily for day 1-14, every 21 days, started on 02/01/2021. Reduced to 1 week on/ 1 week off and added bevacizumab q2weeks from C2 on 02/24/21.  ----Given good tolerance, I changed to  CAPOX and bevacizumab q2weeks from C4 on 03/24/21 with Xeloda 1570m in the AM and 100103min the PM 1 week on/1 week off.     05/08/2021 Imaging   PET  IMPRESSION: 1. There is been interval development of several FDG avid pulmonary nodules within both lungs. Additionally, there is new FDG avid low left paratracheal lymph node. Thoracic metastasis cannot be excluded. 2. Interval improvement in multifocal FDG avid peritoneal metastasis as detailed above.     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 - Polypoid lesion obliterating appendiceal orifice. Biopsied to evaluate for malignancy. - The examined portion of the ileum was normal. - Two large polyps in the cecum, not removed today pending path at appendiceal orifice, bowel prep. If removed endoscopically in the future would favor EMR to be done at the hospital. - One 5 mm polyp at the ileocecal valve, removed with a cold snare. Resected and retrieved. - One 3 mm polyp in the transverse colon, removed with a cold snare. Resected and retrieved. - Parastomal hernia making initial entry to the distal colon challenging. - The examination was otherwise normal.   01/06/2021 Initial Biopsy   Diagnosis 1. Colon, biopsy, appendiceal oraface - ADENOCARCINOMA. 2. Colon, polyp(s), ileocecal valve, transverse, x2 - TUBULAR ADENOMA, NEGATIVE FOR HIGH GRADE DYSPLASIA (X1). - COLONIC MUCOSA WITH UNDERLYING LYMPHOID AGGREGATE, NEGATIVE FOR DYSPLASIA (X1).  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 This is new from  the prior. -Suspected additional pelvic implants along the uterus and right adnexa, poorly visualized, new/progressive from the prior. -Additional pericapsular lesion along the lateral spleen is mildly progressive, suspicious for additional peritoneal implant. -No evidence of metastatic disease in the chest.      CURRENT THERAPY:  First-line chemoXeloda1500 mg twice daily for day 1-14, every 21 days, started on 02/01/2021. Reduced to 1 week on/ 1 week offand addedbevacizumab q2weeksfrom C2 on3/14/22.Given good tolerance, I changed to CAPOX and bevacizumab q2weeks from C4 on 03/24/21 with Xeloda 1561m in the AM and 10026min the PM 1 week on/1 week off.  INTERVAL HISTORY:  DoCARENA STREAMs here for a follow up. She was last seen by me 05/05/21. She presents to the clinic alone.  She is overall doing well.  She has noticed slightly more fatigue after chemo, but is still able to function well at home.  DoKarynand her husband takes care of their 1-46ear-old granddaughter at home during the day.  No significant calluses today or neuropathy.  She reports productive cough for the past 3 to 4 days, no fever, chills, wheezing or worsening shortness of breath.  She has not seen her pulmonologist for a few years.  She is eating well, weight is stable.  All other systems were reviewed with the patient and are negative.  MEDICAL HISTORY:  Past Medical History:  Diagnosis Date  . Acute deep vein thrombosis (DVT) of brachial vein of right upper extremity ([facility]  . Acute on chronic respiratory failure with hypoxia ([facility]  . Asthma   . Cataract   . CH[place], HX OF 01/06/2011   Qualifier: Diagnosis of  By: BlCharlett BlakeD, StErline Levine  . COPD (chronic obstructive pulmonary disease) ([facility]   FeV1 31% predicted FeV1/FVX 47 %  . Essential hypertension 05/23/2020  . Hemorrhoid   . Open right radial fracture 2020  . Osteopenia   . Perforated sigmoid colon ([facility]3/21/2021  . Pleural effusion   .  Pneumonia 2021  . Postoperative intra-abdominal abscess 2021  . Rectal cancer ([facility]  . Seasonal allergies    takes Claritin daily prn  . Shock circulatory ([facility]  . Tracheostomy status ([facility]  . Vitamin D deficiency    takes Vit d every 14 days    SURGICAL HISTORY: Past Surgical History:  Procedure Laterality Date  . AUGMENTATION MAMMAPLASTY     saline  . EXAMINATION UNDER ANESTHESIA  10/14/2012   Procedure: EXAM UNDER ANESTHESIA;  Surgeon: ErGayland CurryMD,FACS;  [facility] Service: General;  Laterality: N/A;  rectal exam under anesthesia excisional hemorrhoidectomy hemorrhoidal banding x two  . EXAMINATION UNDER ANESTHESIA  02/03/2013   excision hemorrhoidal tissue  . FOOT SURGERY     left bunionectomy  . HEMORRHOIDECTOMY WITH HEMORRHOID BANDING  10/14/2012   Procedure: HEMORRHOIDECTOMY WITH HEMORRHOID BANDING;  Surgeon: ErGayland CurryMD,FACS;  [facility] Service: General;  Laterality: N/A;  . IR IMAGING GUIDED PORT INSERTION  02/03/2021  . IR THORACENTESIS ASP PLEURAL SPACE W/IMG GUIDE  04/23/2020  . LAPAROTOMY N/A 03/03/2020   Procedure: low anterior resection end colostomy;  Surgeon: ThLeighton RuffMD;  Location: [facility];  Service: General;  Laterality: N/A;  . LAPAROTOMY N/A 03/12/2020   Procedure: EXPLORATORY LAPAROTOMY WITH BOWEL RESECTION AND COLOSTOMY;  Surgeon: [name], MD;  Location: [facility];  Service: General;  Laterality: N/A;  . OPEN REDUCTION INTERNAL FIXATION (ORIF) DISTAL RADIAL FRACTURE Right 11/01/2018   Procedure: OPEN REDUCTION INTERNAL FIXATION (ORIF) DISTAL RADIAL FRACTURE;  Surgeon: [name], MD;  Location: [place];  Service: Orthopedics;  Laterality: Right;  . TONSILLECTOMY  1960   recurrent otitis media  . US ECHOCARDIOGRAPHY  02/2020    poor windows, normal LV function, severely dilated RV with moderately reduced function, RV volume and pressure overload, mildly dilated RA    I have reviewed the social history  and family history with the patient and they are unchanged from previous note.  ALLERGIES:  is allergic to amlodipine and codeine.  MEDICATIONS:  Current Outpatient Medications  Medication Sig Dispense Refill  . amoxicillin-clavulanate (AUGMENTIN) 875-125 MG tablet Take 1 tablet by mouth 2 (two) times daily. 14 tablet 0  . albuterol (PROVENTIL) (2.5 MG/3ML) 0.083% nebulizer solution Take 3 mLs (2.5 mg total) by nebulization every 4 (four) hours as needed for wheezing or shortness of breath. DX: J44.9 360 mL 5  . albuterol (VENTOLIN HFA) 108 (90 Base) MCG/ACT inhaler Inhale 2 puffs into the lungs every 6 (six) hours as needed for wheezing or shortness of breath. 8 g 5  . ALPRAZolam (XANAX) 0.25 MG tablet Take 1 tablet (0.25 mg total) by mouth at bedtime as needed for anxiety. 30 tablet 0  . capecitabine (XELODA) 500 MG tablet Take 3 tablets (1,500 mg total) by mouth 2 (two) times daily after a meal. Take for 7 days on, 7 days off, repeat every 14 days. 84 tablet 1  . diphenhydrAMINE (BENADRYL) 25 mg capsule Take 25 mg by mouth every 6 (six) hours as needed.    . docusate sodium (COLACE) 50 MG capsule Take 50 mg by mouth daily as needed for mild constipation.    . fluticasone (FLONASE) 50 MCG/ACT nasal spray Place 2 sprays into both nostrils daily. 16 mL 5  . Fluticasone-Umeclidin-Vilant (TRELEGY ELLIPTA) 100-62.5-25 MCG/INH AEPB Inhale 1 puff into the lungs daily. 60 each 2  . lidocaine-prilocaine (EMLA) cream Apply 1 application topically as needed. 30 g 0  . ondansetron (ZOFRAN) 8 MG tablet Take 1 tablet (8 mg total) by mouth every 8 (eight) hours as needed for nausea or vomiting. 20 tablet 2  . traMADol (ULTRAM) 50 MG tablet Take 1 tablet (50 mg total) by mouth every 6 (six) hours as needed. 30 tablet 0   No current facility-administered medications for this visit.   Facility-Administered Medications Ordered in Other Visits  Medication Dose Route Frequency Provider Last Rate Last Admin  .  heparin lock flush 100 unit/mL  500 Units Intracatheter Once PRN [name], MD      . oxaliplatin (ELOXATIN) 115 mg in dextrose 5 % 500 mL chemo infusion  70 mg/m2 (Treatment Plan Recorded) Intravenous Once [name], MD 262 mL/hr at 05/19/21 1416 115 mg at 05/19/21 1416  . sodium chloride flush (NS) 0.9 % injection 10 mL  10 mL Intracatheter PRN [name], MD        PHYSICAL EXAMINATION: ECOG PERFORMANCE STATUS: 2 - Symptomatic, <50% confined to bed  Vitals:   05/19/21 1239  BP: (!) 156/89  Pulse: (!) 108  Resp: 18  Temp: 97.6 F (36.4 C)  SpO2: 93%   Filed Weights  05/19/21 1239  Weight: 127 lb 8 oz (57.8 kg)    GENERAL:alert, no distress and comfortable SKIN: skin color, texture, turgor are normal, no rashes or significant lesions EYES: normal, Conjunctiva are pink and non-injected, sclera clear NECK: supple, thyroid normal size, non-tender, without nodularity LYMPH:  no palpable lymphadenopathy in the cervical, axillary  LUNGS: clear to auscultation and percussion with normal breathing effort, decreased bilateral breath sound, no wheezing or rails. HEART: regular rate & rhythm and no murmurs and no lower extremity edema ABDOMEN:abdomen soft, non-tender and normal bowel sounds Musculoskeletal:no cyanosis of digits and no clubbing  NEURO: alert & oriented x 3 with fluent speech, no focal motor/sensory deficits  LABORATORY DATA:  I have reviewed the data as listed CBC Latest Ref Rng & Units 05/19/2021 05/05/2021 04/21/2021  WBC 4.0 - 10.5 K/uL 9.7 13.6(H) 8.9  Hemoglobin 12.0 - 15.0 g/dL 12.6 12.1 12.2  Hematocrit 36.0 - 46.0 % 38.8 36.0 37.1  Platelets 150 - 400 K/uL 286 255 256     CMP Latest Ref Rng & Units 05/19/2021 05/05/2021 04/21/2021  Glucose 70 - 99 mg/dL 90 108(H) 94  BUN 8 - 23 mg/dL _0 Creatinine 0.44 - 1.00 mg/dL 1.04(H) 0.86 0.94  Sodium 135 - 145 mmol/L 142 140 141  Potassium 3.5 - 5.1 mmol/L 4.1 4.3 3.6  Chloride 98 - 111 mmol/L 107 103 105  CO2 22 -  32 mmol/L _1 Calcium 8.9 - 10.3 mg/dL 9.7 9.4 9.0  Total Protein 6.5 - 8.1 g/dL 7.2 6.8 6.7  Total Bilirubin 0.3 - 1.2 mg/dL 0.3 0.7 0.5  Alkaline Phos 38 - 126 U/L 99 97 103  AST 15 - 41 U/L _2 ALT 0 - 44 U/L _3 RADIOGRAPHIC STUDIES: I have personally reviewed the radiological images as listed and agreed with the findings in the report. No results found.   ASSESSMENT & PLAN:  JANIA STEINKE is a 70 y.o. female with   1. Rectosigmoid cancer(perforated),Stage III,pT4aN1b,with peroration,MSI Stable, peritoneal metastasis in 01/2021 -She was diagnosed in 02/2020.Workup showed she had bowel perforation and underwent emergent surgery. Pathology showed invasiverectosigmoidadenocarcinoma and perforatedbowel with3/15positiveLN.Margins were clear. She haspermanentcolostomy bag in place. Her initial CT scan was negative for distant mets. -She hasstage III colon cancer and with bowel perforation she hasveryhigh risk of cancer recurrence,unfortunately she was not a candidate for adjuvant chemo after surgery due to very slow recovery.  -She did opt to proceed withGuardant Revealstudy in 06/06/20, 07/12/20 and 9/28/21which were allnegative.  -Unfortunately her screening colonoscopy on January 06, 2021 which showed a mass in theappendiceal orificeandbiopsy confirmed adenocarcinoma.01/27/21 PET showedtumor inappendiceal orificeis likely peritoneal met growing into cecum.  -Her peritoneal metastasis is mainly in the pelvis, difficult to biopsy.Her PET scan ispretty convincing for metastatic disease, I do not strongly feel she needs biopsy to confirm. -I started her on First-line chemoXeloda1500 mg twice daily for day 1-14, every 21 days, started on 02/01/2021. Reduced to 1 week on/ 1 week offand addedbevacizumab q2weeksfrom C2 on 02/24/21. Given good tolerance, I changed to CAPOX and bevacizumab q2weeks from C4 on 03/24/21 with Xeloda 1541m in the AM  and 10042min the PM, 1 week on/1 week off.  -I personally reviewed her recent restaging PET scan from last week, which showed good response in peritoneal metastasis.  She has developed several FDG avid pulmonary small nodules, and hypermetabolic left paratracheal lymph node, thoracic metastasis is not ruled  out, but it could be inflammatory especially given her recent respiratory symptoms and underlying COPD. -I will give her a course of Augmentin to see if her productive cough improves -Plan to repeat CT chest in 2 months for close follow-up -I recommend continue current treatment with Xeloda and oxaliplatin, bevacizumab.  She is overall tolerating well. -Lab reviewed, adequate for treatment, will proceed with treatment today.  2.ProvokedRUE DVTon 04/04/20. S/p 3 months of Eliquis  3. Comorbidities: Asthma, COPD, Anxiety -On nebulizer, Cymbalta andXanax 0.13m 1-2 daily, Benadryl and Flonase  -Continue to f/u with pulmonologist and she can request rescue inhaler from them.   4.Goal of care discussion  -The patient understands the goal of care is palliative. -she is full code for now  5. Abdominal hernia  -S/p colon surgery, she has abdominal hernia -I discussed surgery would be an option, if not for her metastatic colon cancer. I recommend she avoid heavy lifting    PLAN: -PET scan reviewed, and discussed with patient -I recommend continue current regimen with low dose CAPOX and beva every 2 weeks -Lab reviewed, adequate for treatment, will proceed to treatment today -I called in Augmentin for 1 week for her bronchitis  No problem-specific Assessment & Plan notes found for this encounter.   No orders of the defined types were placed in this encounter.  All questions were answered. The patient knows to call the clinic with any problems, questions or concerns. No barriers to learning was detected. The total time spent in the appointment was 30 minutes.     YTruitt Merle  MD 05/19/2021   I, AJoslyn Devon am acting as scribe for YTruitt Merle MD.   I have reviewed the above documentation for accuracy and completeness, and I agree with the above.

## 2021-05-16 ENCOUNTER — Telehealth: Payer: Self-pay

## 2021-05-16 NOTE — Telephone Encounter (Signed)
I spoke with Tracy [name] per Dr Burr Medico I relayed her PET scan results.  She verbalized understanding

## 2021-05-19 ENCOUNTER — Other Ambulatory Visit: Payer: Self-pay

## 2021-05-19 ENCOUNTER — Inpatient Hospital Stay: Payer: Medicare HMO | Admitting: Hematology

## 2021-05-19 ENCOUNTER — Inpatient Hospital Stay: Payer: Medicare HMO | Attending: Hematology

## 2021-05-19 ENCOUNTER — Inpatient Hospital Stay: Payer: Medicare HMO

## 2021-05-19 ENCOUNTER — Encounter: Payer: Self-pay | Admitting: Hematology

## 2021-05-19 VITALS — BP 156/89 | HR 108 | Temp 97.6°F | Resp 18 | Ht 62.0 in | Wt 127.5 lb

## 2021-05-19 VITALS — BP 103/41 | HR 97

## 2021-05-19 DIAGNOSIS — C786 Secondary malignant neoplasm of retroperitoneum and peritoneum: Secondary | ICD-10-CM | POA: Insufficient documentation

## 2021-05-19 DIAGNOSIS — C20 Malignant neoplasm of rectum: Secondary | ICD-10-CM

## 2021-05-19 DIAGNOSIS — C7989 Secondary malignant neoplasm of other specified sites: Secondary | ICD-10-CM | POA: Diagnosis not present

## 2021-05-19 DIAGNOSIS — Z933 Colostomy status: Secondary | ICD-10-CM | POA: Diagnosis not present

## 2021-05-19 DIAGNOSIS — Z79899 Other long term (current) drug therapy: Secondary | ICD-10-CM | POA: Diagnosis not present

## 2021-05-19 DIAGNOSIS — Z5112 Encounter for antineoplastic immunotherapy: Secondary | ICD-10-CM | POA: Insufficient documentation

## 2021-05-19 DIAGNOSIS — C181 Malignant neoplasm of appendix: Secondary | ICD-10-CM | POA: Diagnosis not present

## 2021-05-19 DIAGNOSIS — I10 Essential (primary) hypertension: Secondary | ICD-10-CM | POA: Diagnosis not present

## 2021-05-19 DIAGNOSIS — Z5111 Encounter for antineoplastic chemotherapy: Secondary | ICD-10-CM | POA: Insufficient documentation

## 2021-05-19 DIAGNOSIS — C799 Secondary malignant neoplasm of unspecified site: Secondary | ICD-10-CM

## 2021-05-19 DIAGNOSIS — M8580 Other specified disorders of bone density and structure, unspecified site: Secondary | ICD-10-CM | POA: Diagnosis not present

## 2021-05-19 DIAGNOSIS — J439 Emphysema, unspecified: Secondary | ICD-10-CM | POA: Diagnosis not present

## 2021-05-19 DIAGNOSIS — Z85048 Personal history of other malignant neoplasm of rectum, rectosigmoid junction, and anus: Secondary | ICD-10-CM | POA: Diagnosis not present

## 2021-05-19 LAB — CBC WITH DIFFERENTIAL (CANCER CENTER ONLY)
Abs Immature Granulocytes: 0.03 10*3/uL (ref 0.00–0.07)
Basophils Absolute: 0 10*3/uL (ref 0.0–0.1)
Basophils Relative: 0 %
Eosinophils Absolute: 0.1 10*3/uL (ref 0.0–0.5)
Eosinophils Relative: 1 %
HCT: 38.8 % (ref 36.0–46.0)
Hemoglobin: 12.6 g/dL (ref 12.0–15.0)
Immature Granulocytes: 0 %
Lymphocytes Relative: 18 %
Lymphs Abs: 1.7 10*3/uL (ref 0.7–4.0)
MCH: 34.5 pg — ABNORMAL HIGH (ref 26.0–34.0)
MCHC: 32.5 g/dL (ref 30.0–36.0)
MCV: 106.3 fL — ABNORMAL HIGH (ref 80.0–100.0)
Monocytes Absolute: 1.2 10*3/uL — ABNORMAL HIGH (ref 0.1–1.0)
Monocytes Relative: 12 %
Neutro Abs: 6.6 10*3/uL (ref 1.7–7.7)
Neutrophils Relative %: 69 %
Platelet Count: 286 10*3/uL (ref 150–400)
RBC: 3.65 MIL/uL — ABNORMAL LOW (ref 3.87–5.11)
RDW: 15.4 % (ref 11.5–15.5)
WBC Count: 9.7 10*3/uL (ref 4.0–10.5)
nRBC: 0 % (ref 0.0–0.2)

## 2021-05-19 LAB — CMP (CANCER CENTER ONLY)
ALT: 11 U/L (ref 0–44)
AST: 19 U/L (ref 15–41)
Albumin: 3.6 g/dL (ref 3.5–5.0)
Alkaline Phosphatase: 99 U/L (ref 38–126)
Anion gap: 12 (ref 5–15)
BUN: 17 mg/dL (ref 8–23)
CO2: 23 mmol/L (ref 22–32)
Calcium: 9.7 mg/dL (ref 8.9–10.3)
Chloride: 107 mmol/L (ref 98–111)
Creatinine: 1.04 mg/dL — ABNORMAL HIGH (ref 0.44–1.00)
GFR, Estimated: 59 mL/min — ABNORMAL LOW (ref >60)
Glucose, Bld: 90 mg/dL (ref 70–99)
Potassium: 4.1 mmol/L (ref 3.5–5.1)
Sodium: 142 mmol/L (ref 135–145)
Total Bilirubin: 0.3 mg/dL (ref 0.3–1.2)
Total Protein: 7.2 g/dL (ref 6.5–8.1)

## 2021-05-19 LAB — TOTAL PROTEIN, URINE DIPSTICK: Protein, ur: 30 mg/dL — AB

## 2021-05-19 LAB — CEA (IN HOUSE-CHCC): CEA (CHCC-In House): 2.7 ng/mL (ref 0.00–5.00)

## 2021-05-19 LAB — IRON AND TIBC
Iron: 88 ug/dL (ref 41–142)
Saturation Ratios: 25 % (ref 21–57)
TIBC: 360 ug/dL (ref 236–444)
UIBC: 272 ug/dL (ref 120–384)

## 2021-05-19 LAB — FERRITIN: Ferritin: 206 ng/mL (ref 11–307)

## 2021-05-19 MED ORDER — OXALIPLATIN CHEMO INJECTION 100 MG/20ML
70.0000 mg/m2 | Freq: Once | INTRAVENOUS | Status: AC
  Administered 2021-05-19: 115 mg via INTRAVENOUS
  Filled 2021-05-19: qty 20

## 2021-05-19 MED ORDER — SODIUM CHLORIDE 0.9 % IV SOLN
Freq: Once | INTRAVENOUS | Status: AC
  Filled 2021-05-19: qty 250

## 2021-05-19 MED ORDER — ALTEPLASE 2 MG IJ SOLR
INTRAMUSCULAR | Status: AC
  Filled 2021-05-19: qty 2

## 2021-05-19 MED ORDER — AMOXICILLIN-POT CLAVULANATE 875-125 MG PO TABS: 1.0000 | ORAL_TABLET | Freq: Two times a day (BID) | ORAL | 0 refills | Status: DC

## 2021-05-19 MED ORDER — HEPARIN SOD (PORK) LOCK FLUSH 100 UNIT/ML IV SOLN
500.0000 [IU] | Freq: Once | INTRAVENOUS | Status: AC | PRN
  Administered 2021-05-19: 500 [IU]
  Filled 2021-05-19: qty 5

## 2021-05-19 MED ORDER — SODIUM CHLORIDE 0.9% FLUSH
10.0000 mL | Freq: Once | INTRAVENOUS | Status: AC
Start: 2021-05-19 — End: 2021-05-19
  Administered 2021-05-19: 10 mL
  Filled 2021-05-19: qty 10

## 2021-05-19 MED ORDER — PALONOSETRON HCL INJECTION 0.25 MG/5ML
INTRAVENOUS | Status: AC
  Filled 2021-05-19: qty 5

## 2021-05-19 MED ORDER — SODIUM CHLORIDE 0.9 % IV SOLN
5.0000 mg/kg | Freq: Once | INTRAVENOUS | Status: AC
  Administered 2021-05-19: 300 mg via INTRAVENOUS
  Filled 2021-05-19: qty 12

## 2021-05-19 MED ORDER — SODIUM CHLORIDE 0.9% FLUSH
10.0000 mL | INTRAVENOUS | Status: DC | PRN
  Administered 2021-05-19: 10 mL
  Filled 2021-05-19: qty 10

## 2021-05-19 MED ORDER — CAPECITABINE 500 MG PO TABS: 1000.0000 mg/m2 | ORAL_TABLET | Freq: Two times a day (BID) | ORAL | 1 refills | Status: DC

## 2021-05-19 MED ORDER — ALTEPLASE 2 MG IJ SOLR
2.0000 mg | Freq: Once | INTRAMUSCULAR | Status: AC
  Administered 2021-05-19: 2 mg
  Filled 2021-05-19: qty 2

## 2021-05-19 MED ORDER — SODIUM CHLORIDE 0.9 % IV SOLN
10.0000 mg | Freq: Once | INTRAVENOUS | Status: AC
  Administered 2021-05-19: 10 mg via INTRAVENOUS
  Filled 2021-05-19: qty 10

## 2021-05-19 MED ORDER — PALONOSETRON HCL INJECTION 0.25 MG/5ML
0.2500 mg | Freq: Once | INTRAVENOUS | Status: AC
  Administered 2021-05-19: 0.25 mg via INTRAVENOUS

## 2021-05-19 MED ORDER — DEXTROSE 5 % IV SOLN
Freq: Once | INTRAVENOUS | Status: AC
Start: 2021-05-19 — End: 2021-05-19
  Filled 2021-05-19: qty 250

## 2021-05-19 NOTE — Patient Instructions (Signed)
[facility]  Discharge Instructions: Thank you for choosing [facility] to provide your oncology and hematology care.   If you have a lab appointment with the Cancer Center, please go directly to the Cancer Center and check in at the registration area.   Wear comfortable clothing and clothing appropriate for easy access to any Portacath or PICC line.   We strive to give you quality time with your provider. You may need to reschedule your appointment if you arrive late (15 or more minutes).  Arriving late affects you and other patients whose appointments are after yours.  Also, if you miss three or more appointments without notifying the office, you may be dismissed from the clinic at the provider's discretion.      For prescription refill requests, have your pharmacy contact our office and allow 72 hours for refills to be completed.    Today you received the following chemotherapy and/or immunotherapy agents: Bevacizumab and Oxaliplatin      To help prevent nausea and vomiting after your treatment, we encourage you to take your nausea medication as directed.  BELOW ARE SYMPTOMS THAT SHOULD BE REPORTED IMMEDIATELY: . *FEVER GREATER THAN 100.4 F (38 C) OR HIGHER . *CHILLS OR SWEATING . *NAUSEA AND VOMITING THAT IS NOT CONTROLLED WITH YOUR NAUSEA MEDICATION . *UNUSUAL SHORTNESS OF BREATH . *UNUSUAL BRUISING OR BLEEDING . *URINARY PROBLEMS (pain or burning when urinating, or frequent urination) . *BOWEL PROBLEMS (unusual diarrhea, constipation, pain near the anus) . TENDERNESS IN MOUTH AND THROAT WITH OR WITHOUT PRESENCE OF ULCERS (sore throat, sores in mouth, or a toothache) . UNUSUAL RASH, SWELLING OR PAIN  . UNUSUAL VAGINAL DISCHARGE OR ITCHING   Items with * indicate a potential emergency and should be followed up as soon as possible or go to the Emergency Department if any problems should occur.  Please show the CHEMOTHERAPY ALERT CARD or  IMMUNOTHERAPY ALERT CARD at check-in to the Emergency Department and triage nurse.  Should you have questions after your visit or need to cancel or reschedule your appointment, please contact [facility]  Dept: [phone]  and follow the prompts.  Office hours are 8:00 a.m. to 4:30 p.m. Monday - Friday. Please note that voicemails left after 4:00 p.m. may not be returned until the following business day.  We are closed weekends and major holidays. You have access to a nurse at all times for urgent questions. Please call the main number to the clinic Dept: [phone] and follow the prompts.   For any non-urgent questions, you may also contact your provider using MyChart. We now offer e-Visits for anyone 18 and older to request care online for non-urgent symptoms. For details visit [email or web address].   Also download the MyChart app! Go to the app store, search "MyChart", open the app, select [facility], and log in with your MyChart username and password.  Due to Covid, a mask is required upon entering the hospital/clinic. If you do not have a mask, one will be given to you upon arrival. For doctor visits, patients may have 1 support person aged 18 or older with them. For treatment visits, patients cannot have anyone with them due to current Covid guidelines and our immunocompromised population.   

## 2021-05-20 DIAGNOSIS — J90 Pleural effusion, not elsewhere classified: Secondary | ICD-10-CM | POA: Diagnosis not present

## 2021-05-20 DIAGNOSIS — R69 Illness, unspecified: Secondary | ICD-10-CM | POA: Diagnosis not present

## 2021-05-20 DIAGNOSIS — M6281 Muscle weakness (generalized): Secondary | ICD-10-CM | POA: Diagnosis not present

## 2021-05-20 DIAGNOSIS — C20 Malignant neoplasm of rectum: Secondary | ICD-10-CM | POA: Diagnosis not present

## 2021-05-20 DIAGNOSIS — R1310 Dysphagia, unspecified: Secondary | ICD-10-CM | POA: Diagnosis not present

## 2021-05-20 DIAGNOSIS — J9600 Acute respiratory failure, unspecified whether with hypoxia or hypercapnia: Secondary | ICD-10-CM | POA: Diagnosis not present

## 2021-05-20 DIAGNOSIS — I82621 Acute embolism and thrombosis of deep veins of right upper extremity: Secondary | ICD-10-CM | POA: Diagnosis not present

## 2021-05-20 DIAGNOSIS — E43 Unspecified severe protein-calorie malnutrition: Secondary | ICD-10-CM | POA: Diagnosis not present

## 2021-05-20 DIAGNOSIS — J449 Chronic obstructive pulmonary disease, unspecified: Secondary | ICD-10-CM | POA: Diagnosis not present

## 2021-05-21 ENCOUNTER — Encounter: Payer: Self-pay | Admitting: Hematology

## 2021-05-21 ENCOUNTER — Other Ambulatory Visit (HOSPITAL_COMMUNITY): Payer: Self-pay

## 2021-05-21 ENCOUNTER — Telehealth: Payer: Self-pay | Admitting: Hematology

## 2021-05-21 ENCOUNTER — Other Ambulatory Visit: Payer: Self-pay | Admitting: Hematology

## 2021-05-21 ENCOUNTER — Telehealth: Payer: Self-pay

## 2021-05-21 MED ORDER — PAXLOVID 20 X 150 MG & 10 X 100MG PO TBPK
3.0000 | ORAL_TABLET | Freq: Two times a day (BID) | ORAL | 0 refills | Status: DC
  Filled 2021-05-21 (×2): qty 30, 5d supply, fill #0

## 2021-05-21 NOTE — Telephone Encounter (Signed)
Tracy [name] called today and informed us that she was tested positive for COVID today.  She started having nasal and chest congestion in the morning of 6/5, with productive cough, overall symptom is mild, no fever, worsening shortness of breath, or other new symptoms.  I saw her 2 days ago and she received chemo.  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  Potential benefit and side effects discussed with her, I called into [facility] pharmacy, drug drug interaction was checked, her GFR is about 60.  Patient agrees to pick up today, and start tonight.  She knows to go to emergency room if she has developed a severe dyspnea or hypoxia.  She will update [name] next week about her recovery, to see if she needs postpone her next treatment.  [name]  05/21/2021

## 2021-05-21 NOTE — Telephone Encounter (Signed)
This patient called and stated that she tested positive for COVID this morning.  Patient stated symptoms began Sunday morning.  She remains afebrile and has nasal congestion and cough.  Patient is taking Augmentin that was prescribed on 05/19/21.  MD made aware.  Will follow up.

## 2021-05-22 ENCOUNTER — Other Ambulatory Visit (HOSPITAL_COMMUNITY): Payer: Self-pay

## 2021-05-29 ENCOUNTER — Telehealth: Payer: Self-pay

## 2021-05-29 NOTE — Telephone Encounter (Signed)
Tracy [name] called wanting to cancel her appts/treatment scheduled for 6/27.  Appts cancelled

## 2021-06-02 ENCOUNTER — Other Ambulatory Visit: Payer: Medicare HMO

## 2021-06-02 ENCOUNTER — Ambulatory Visit: Payer: Medicare HMO | Admitting: Hematology

## 2021-06-02 ENCOUNTER — Ambulatory Visit: Payer: Medicare HMO

## 2021-06-05 ENCOUNTER — Ambulatory Visit (HOSPITAL_COMMUNITY)
Admission: RE | Admit: 2021-06-05 | Discharge: 2021-06-05 | Disposition: A | Discharge status: Home / Self Care | Payer: Medicare HMO | Source: Ambulatory Visit | Attending: Nurse Practitioner | Admitting: Nurse Practitioner

## 2021-06-05 ENCOUNTER — Other Ambulatory Visit: Payer: Self-pay

## 2021-06-05 ENCOUNTER — Telehealth: Payer: Self-pay | Admitting: Nurse Practitioner

## 2021-06-05 DIAGNOSIS — R059 Cough, unspecified: Secondary | ICD-10-CM

## 2021-06-05 DIAGNOSIS — Z7189 Other specified counseling: Secondary | ICD-10-CM

## 2021-06-05 DIAGNOSIS — C20 Malignant neoplasm of rectum: Secondary | ICD-10-CM

## 2021-06-05 DIAGNOSIS — J439 Emphysema, unspecified: Secondary | ICD-10-CM | POA: Diagnosis not present

## 2021-06-05 MED ORDER — DOXYCYCLINE HYCLATE 100 MG PO TABS: 100.0000 mg | ORAL_TABLET | Freq: Two times a day (BID) | ORAL | 0 refills | Status: DC

## 2021-06-05 NOTE — Progress Notes (Signed)
Tracy [name] called stating that she had diarrhea for 2 weeks while she was on antivirals for covid.  The diarrhea has subsided but she has skin breakdown around her stoma secondary to leaking appliance.  She is concerned this is "infected".  They will send a picture of the area. I have placed a referral to the ostomy clinic.  She also states she has a productive cough with green sputum.  She has sinus congestion producing clear drainage.  She is requesting an antibiotic. I reviewed with Tracy Rue, NP. Order for CXR 2 view placed.  Tracy [name] notified

## 2021-06-05 NOTE — Telephone Encounter (Signed)
I called [name] to review her symptoms. She feels very tired. Initial cough from covid-19 infection resolved but she has a new cough started last week. Sputum was yellow now green. Baseline dyspnea is unchanged. She has subjective fever but not recorded. Her skin around stoma is also really red, irritated, and painful, with minimal drainage. Diarrhea from Paxlovid has subsided. I let her know I am concerned her condition could deteriorate quickly. She declined to come in today to be seen. Chest xray looks normal. Given potential respiratory and skin infection sources, will treat with doxycycline. Allergies reviewed. She will start today. Family plans to visit this weekend, I reviewed precautions. She verbalized understanding to go to ER if her condition worsens or she fails to improve. Will ask my nurse to call her tomorrow.   Tracy Rue, NP

## 2021-06-09 ENCOUNTER — Ambulatory Visit: Payer: Medicare HMO | Admitting: Hematology

## 2021-06-09 ENCOUNTER — Other Ambulatory Visit: Payer: Medicare HMO

## 2021-06-09 ENCOUNTER — Ambulatory Visit: Payer: Medicare HMO

## 2021-06-10 ENCOUNTER — Encounter: Payer: Self-pay | Admitting: Nurse Practitioner

## 2021-06-17 ENCOUNTER — Other Ambulatory Visit: Payer: Medicare HMO

## 2021-06-17 ENCOUNTER — Ambulatory Visit: Payer: Medicare HMO

## 2021-06-17 ENCOUNTER — Ambulatory Visit: Payer: Medicare HMO | Admitting: Nurse Practitioner

## 2021-06-17 DIAGNOSIS — Z933 Colostomy status: Secondary | ICD-10-CM | POA: Diagnosis not present

## 2021-06-18 DIAGNOSIS — Z933 Colostomy status: Secondary | ICD-10-CM | POA: Diagnosis not present

## 2021-06-19 DIAGNOSIS — I82621 Acute embolism and thrombosis of deep veins of right upper extremity: Secondary | ICD-10-CM | POA: Diagnosis not present

## 2021-06-19 DIAGNOSIS — E43 Unspecified severe protein-calorie malnutrition: Secondary | ICD-10-CM | POA: Diagnosis not present

## 2021-06-19 DIAGNOSIS — J449 Chronic obstructive pulmonary disease, unspecified: Secondary | ICD-10-CM | POA: Diagnosis not present

## 2021-06-19 DIAGNOSIS — M6281 Muscle weakness (generalized): Secondary | ICD-10-CM | POA: Diagnosis not present

## 2021-06-19 DIAGNOSIS — C20 Malignant neoplasm of rectum: Secondary | ICD-10-CM | POA: Diagnosis not present

## 2021-06-19 DIAGNOSIS — J9600 Acute respiratory failure, unspecified whether with hypoxia or hypercapnia: Secondary | ICD-10-CM | POA: Diagnosis not present

## 2021-06-19 DIAGNOSIS — J90 Pleural effusion, not elsewhere classified: Secondary | ICD-10-CM | POA: Diagnosis not present

## 2021-06-19 DIAGNOSIS — R1310 Dysphagia, unspecified: Secondary | ICD-10-CM | POA: Diagnosis not present

## 2021-06-19 DIAGNOSIS — R69 Illness, unspecified: Secondary | ICD-10-CM | POA: Diagnosis not present

## 2021-06-19 DIAGNOSIS — Z933 Colostomy status: Secondary | ICD-10-CM | POA: Diagnosis not present

## 2021-06-20 DIAGNOSIS — Z933 Colostomy status: Secondary | ICD-10-CM | POA: Diagnosis not present

## 2021-06-21 DIAGNOSIS — Z933 Colostomy status: Secondary | ICD-10-CM | POA: Diagnosis not present

## 2021-06-22 ENCOUNTER — Other Ambulatory Visit: Payer: Self-pay | Admitting: Nurse Practitioner

## 2021-06-22 DIAGNOSIS — Z933 Colostomy status: Secondary | ICD-10-CM | POA: Diagnosis not present

## 2021-06-22 DIAGNOSIS — C20 Malignant neoplasm of rectum: Secondary | ICD-10-CM

## 2021-06-22 NOTE — Progress Notes (Signed)
[place]   [phone] [phone]   Clinic Follow up Note   Patient Care Team: [name], DO as PCP - General (Family Medicine) [name], MD as Consulting Physician (Hematology) Icard, Octavio Graves, DO as Consulting Physician (Pulmonary Disease) Strum, P.A. [name], MD as Consulting Physician (General Surgery) [name], MD as Consulting Physician (General Surgery) [name], [name], MD as Consulting Physician (Gastroenterology) 06/23/2021  CHIEF COMPLAINT: Follow up rectal cancer  SUMMARY OF ONCOLOGIC HISTORY: Oncology History Overview Note  Cancer Staging Rectal cancer [facility]) Staging form: Colon and Rectum, AJCC 8th Edition - Pathologic stage from 03/03/2020: Stage IIIB (pT4a, pN1b, cM0) - Signed by [name], MD on 01/28/2021 Stage prefix: Initial diagnosis Histologic grading system: 4 grade system Histologic grade (G): G2 Residual tumor (R): R0 - None    Rectal cancer ([place])  03/03/2020 - 04/05/2020 Hospital Admission   She was admitted to ED on 03/03/20 for abdominal pain and nausea. She had been having diarrhea for several weeks. During hospital stay she developed acute respiratory failure, AKI, RUE DVT from PICC line. Work up showed bowel perforation, small left liver mass and thickening of jejunum. She underwent emergent surgery on 03/03/20 for resection and colostomy placement. Her path showed invasive cancer, metastatic to 3/15 LNs. She had a NGT in placed but this was removed on POD 3. Post op her stoma became necrotic and septic. She had another bowel surgery on 03/12/20, which was NED with necrotic tissue.    03/03/2020 Imaging   CT AP 03/03/20  IMPRESSION: 1. Free intraperitoneal air consistent perforation. Most likely site of perforation is in the distal splenic flexure/proximal descending colon where there is a collection of air and gas measuring 6 centimeters. No obvious soft tissue mass  identified in this region. At this site, there is abrupt transition of dilated, stool-filled colon to completely decompressed proximal descending colon. 2. Thickened, inflamed loops of jejunum are identified within the pelvis and are likely reactive. 3. Small hiatal hernia. 4. Benign-appearing 1.6 centimeter mass the LEFT hepatic lobe. Recommend comparison with prior studies if available. 5.  Emphysema (ICD10-J43.9). 6.  Aortic Atherosclerosis (ICD10-I70.0). 7. Bilateral renal scarring.   03/03/2020 Surgery   low anterior resection end colostomy by Dr Marcello Moores    03/03/2020 Initial Biopsy   FINAL MICROSCOPIC DIAGNOSIS: 03/03/20 A. RECTOSIGMOID COLON, LOW ANTERIOR RESECTION:  - Invasive colonic adenocarcinoma, 3.5 cm.  - Tumor invades the visceral peritoneum.  - Margins of resection are not involved.  - Metastatic carcinoma in (3) of (15) lymph nodes.  - See oncology table.   B. ADDITIONAL SIGMOID COLON, RESECTION:  - Colonic tissue, negative for carcinoma.  ADDENDUM:  Mismatch Repair Protein (IHC)   SUMMARY INTERPRETATION: NORMAL  There is preserved expression of the major MMR proteins. There is a very  low probability that microsatellite instability (MSI) is present.  However, certain clinically significant MMR protein mutations may result  in preservation of nuclear expression. It is recommended that the  preservation of protein expression be correlated with molecular based  MSI testing.   IHC EXPRESSION RESULTS  TEST           RESULT  MLH1:          Preserved nuclear expression  MSH2:          Preserved nuclear expression  MSH6:          Preserved nuclear expression  PMS2:  Preserved nuclear expression   03/03/2020 Cancer Staging   Staging form: Colon and Rectum, AJCC 8th Edition - Pathologic stage from 03/03/2020: Stage IIIB (pT4a, pN1b, cM0) - Signed by [name], MD on 01/28/2021  Stage prefix: Initial diagnosis  Histologic grading system: 4 grade  system  Histologic grade (G): G2  Residual tumor (R): R0 - None    03/08/2020 Imaging   CT AP 03/08/20 IMPRESSION: 1. Interval midline laparotomy with distal colon resection and diverting left lower quadrant colostomy. 2. Diffuse small bowel dilatation with gas fluid levels most consistent with postoperative ileus. 3. Trace ascites within the abdomen and pelvis. No fluid collection or abscess at this time. Surgical drain within the lower pelvis. 4. Indeterminate 1.4 cm subcapsular liver hypodensity. In light of newly diagnosed rectal cancer, metastatic disease cannot be excluded. PET CT may be useful for further evaluation. 5. Interval development of trace bilateral pleural effusions and diffuse body wall edema.   03/12/2020 Surgery   EXPLORATORY LAPAROTOMY WITH BOWEL RESECTION AND COLOSTOMY by Dr Hassell Done 03/12/20  FINAL MICROSCOPIC DIAGNOSIS: 03/12/20  A. COLON, SPLENIC FLEXURE, RESECTION:  - Segment of colon (37 cm) with perforation and associated inflammation  - Multiple mucosal ulcers with necrotizing inflammation  - No evidence of malignancy    03/12/2020 Imaging   CT AP WO contrast 03/12/20  IMPRESSION: 1. Exam is limited by lack of intravenous contrast, motion, and artifact from the patient's arms adjacent to the torso. Since 03/08/2020, there has been development of relatively large pockets of intraperitoneal free air. While intraperitoneal gas would not be unexpected on postoperative day 9, this gas is new since an intervening study of 03/08/2020 and given the relatively large volume certainly raises concern for bowel perforation. No source for the intraperitoneal free air is evident on this study. There is a surgical drain in the pelvis, but there is no free gas around the drain itself to suggest that it represents the source. 2. New circumferential wall thickening in the splenic flexure, descending colon and sigmoid colon leading into the end  colostomy. Infection/inflammation would be a consideration. Ischemia cannot be excluded. 3. Relatively small volume intraperitoneal free fluid. High attenuation small fluid collections in the left upper abdomen may reflect hemorrhage, infection or residua from prior perforation. 4. Interval progression of diffuse body wall edema. 5. Residual contrast material in the renal parenchyma from prior imaging, compatible with renal dysfunction.    03/19/2020 Imaging   CT CAP 03/19/20  IMPRESSION: 1. 5.5 x 3.2 cm fluid collection is noted along the greater curvature of the proximal stomach. 2. Surgical drain is again noted in the pelvis with tip in left lower quadrant. 3. Interval development of crescent-shaped fluid collection measuring 16.4 x 3.3 cm in the epigastric region and left upper quadrant of the abdomen which may extend into the left lower quadrant. Potentially this may represent abscess or developing abscess. Multiple other smaller fluid collections are noted which may represent small abscesses. 4. 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 5. Mild amount of free fluid is noted in the posterior pelvis. 6. Moderate anasarca is noted. Aortic Atherosclerosis (ICD10-I70.0).   04/12/2020 Imaging   CT AP 04/12/20  IMPRESSION: 1. Fluid collections within the LEFT abdomen have significantly decreased compared to previous CT exams, now nearly completely resolved. 2. Percutaneous drainage catheter with tip coiled posterior to the LEFT kidney, stable positioning compared to the previous study. The more anterior catheter has been removed. 3. Small amount of free fluid persists within  the abdomen and pelvis. 4. Trace bibasilar pleural effusions. 5. Anasarca. 6. While reviewing today's study, comparing with a chest/abdomen/pelvis CT from earlier same month, there is question of thrombus in the LEFT internal jugular vein. Recommend ultrasound of the LEFT IJ to exclude DVT. This  recommendation discussed with patient's hospitalist, Dr. Owens Shark, on 04/12/2020 at 4:20 p.m.   Emphysema (ICD10-J43.9).   04/22/2020 Imaging   CT AP 04/22/20  IMPRESSION: 1. No recurrent intra-abdominal abscess status post interval removal of left retroperitoneal percutaneous drain. Stable small amount of free pelvic fluid. 2. Enlarging dependent bilateral pleural effusions, now moderate in volume. Associated atelectasis at both lung bases. 3. Progressive anasarca with generalized edema throughout the subcutaneous fat. 4. Stable small subcapsular fluid collection along the anterior aspect of the left hepatic lobe. 5. Aortic Atherosclerosis (ICD10-I70.0).   05/23/2020 Initial Diagnosis   Rectal cancer ([place])   01/14/2021 Imaging   CT CAP IMPRESSION: -5.0 cm soft tissue mass along the posterior aspect of the cecum at the base of the appendix, favored to reflect a peritoneal/serosal implant, corresponding to the patient's known adenocarcinoma. This is new from the prior. -Suspected additional pelvic implants along the uterus and right adnexa, poorly visualized, new/progressive from the prior. -Additional pericapsular lesion along the lateral spleen is mildly progressive, suspicious for additional peritoneal implant. -No evidence of metastatic disease in the chest.   01/27/2021 PET scan   IMPRESSION:  1. Extensive hypermetabolic peritoneal metastasis, as detailed  above.  2. No evidence of hypermetabolic supradiaphragmatic disease.  3. Diffuse low-level thyroid hypermetabolism can be seen in the  setting of thyroiditis. Consider correlation with thyroid function  labs.  4. Aortic atherosclerosis (ICD10-I70.0) and emphysema (ICD10-J43.9).    02/01/2021 -  Chemotherapy    First-line chemo Xeloda 1500 mg twice daily for day 1-14, every 21 days, started on 02/01/2021. Reduced to 1 week on/ 1 week off and added bevacizumab q2weeks from C2 on 02/24/21.  ----Given good tolerance, I changed to  CAPOX and bevacizumab q2weeks from C4 on 03/24/21 with Xeloda 1572m in the AM and 10047min the PM 1 week on/1 week off.     05/08/2021 Imaging   PET  IMPRESSION: 1. There is been interval development of several FDG avid pulmonary nodules within both lungs. Additionally, there is new FDG avid low left paratracheal lymph node. Thoracic metastasis cannot be excluded. 2. Interval improvement in multifocal FDG avid peritoneal metastasis as detailed above.     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 - Polypoid lesion obliterating appendiceal orifice. Biopsied to evaluate for malignancy. - The examined portion of the ileum was normal. - Two large polyps in the cecum, not removed today pending path at appendiceal orifice, bowel prep. If removed endoscopically in the future would favor EMR to be done at the hospital. - One 5 mm polyp at the ileocecal valve, removed with a cold snare. Resected and retrieved. - One 3 mm polyp in the transverse colon, removed with a cold snare. Resected and retrieved. - Parastomal hernia making initial entry to the distal colon challenging. - The examination was otherwise normal.   01/06/2021 Initial Biopsy   Diagnosis 1. Colon, biopsy, appendiceal oraface - ADENOCARCINOMA. 2. Colon, polyp(s), ileocecal valve, transverse, x2 - TUBULAR ADENOMA, NEGATIVE FOR HIGH GRADE DYSPLASIA (X1). - COLONIC MUCOSA WITH UNDERLYING LYMPHOID AGGREGATE, NEGATIVE FOR DYSPLASIA (X1).  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 This is new from  the prior. -Suspected additional pelvic implants along the uterus and right adnexa, poorly visualized, new/progressive from the prior. -Additional pericapsular lesion along the lateral spleen is mildly progressive, suspicious for additional peritoneal implant. -No evidence of metastatic disease in the chest.     CURRENT THERAPY:  First-line chemo Xeloda 1500 mg twice daily for day 1-14, every 21 days, started on 02/01/2021. Reduced to 1 week on/ 1 week off and added bevacizumab q2weeks from C2 on 02/24/21. Given good tolerance, changed to CAPOX and bevacizumab q2weeks from C4 on 03/24/21 with Xeloda 1524m in the AM and 10051min the PM 1 week on/1 week off.   INTERVAL HISTORY: Ms. OgYamamotoeturns for follow up and treatment as scheduled. She was last seen 05/19/21 and proceeded with C5 CAPOX and received another cycle of Beva. She was prescribed Augmentin for perceived bronchitis. She tested positive for COVID-19 on 05/21/21, treated with Paxlovid. She called my nurse 6/23 and I spoke to her for c/o fatigue and new productive cough. She also had concerns about possible skin infection at the stoma. She was given doxycycline and felt better.  She presents today to begin a new treatment cycle, but forgot Xeloda this morning.  She is not doing great.  After Doxy, her stoma irritation completely resolved but she has persistent/worsening cough with green sputum.  She had a 20-minute coughing fit last night.  Her baseline dyspnea is slightly worse.  She cannot do much around the house.  She is using respiratory meds and Benadryl to dry up nasal drainage.  She has not called pulmonology.  Denies fever, chills, chest pain.  She denies other specific complaints such as new pain, nausea/vomiting.  Bowels moving well.   MEDICAL HISTORY:  Past Medical History:  Diagnosis Date   Acute deep vein thrombosis (DVT) of brachial vein of right upper extremity (HCC)    Acute on chronic respiratory failure with hypoxia  (HCC)    Asthma    Cataract    CHICKENPOX, HX OF 01/06/2011   Qualifier: Diagnosis of  By: BlCharlett BlakeD, Stacey     COPD (chronic obstructive pulmonary disease) ([facility]   FeV1 31% predicted FeV1/FVX 47 %   Essential hypertension 05/23/2020   Hemorrhoid    Open right radial fracture 2020   Osteopenia    Perforated sigmoid colon ([facility]3/21/2021   Pleural effusion    Pneumonia 2021   Postoperative intra-abdominal abscess 2021   Rectal cancer (HCC)    Seasonal allergies    takes Claritin daily prn   Shock circulatory ([facility]   Tracheostomy status ([facility]   Vitamin D deficiency    takes Vit d every 14 days    SURGICAL HISTORY: Past Surgical History:  Procedure Laterality Date   AUGMENTATION MAMMAPLASTY     saline   EXAMINATION UNDER ANESTHESIA  10/14/2012   Procedure: EXAM UNDER ANESTHESIA;  Surgeon: ErGayland CurryMD,FACS;  [place] [facility] Service: General;  Laterality: N/A;  rectal exam under anesthesia excisional hemorrhoidectomy hemorrhoidal banding x two   EXAMINATION UNDER ANESTHESIA  02/03/2013   excision hemorrhoidal tissue   FOOT SURGERY     left bunionectomy  HEMORRHOIDECTOMY WITH HEMORRHOID BANDING  10/14/2012   Procedure: [id number] WITH HEMORRHOID BANDING;  Surgeon: Gayland Curry, MD,FACS;  Location: [facility];  Service: General;  Laterality: N/A;   IR IMAGING GUIDED PORT INSERTION  02/03/2021   IR THORACENTESIS ASP PLEURAL SPACE W/IMG GUIDE  04/23/2020   LAPAROTOMY N/A 03/03/2020   Procedure: low anterior resection end colostomy;  Surgeon: [name], MD;  Location: [facility];  Service: General;  Laterality: N/A;   LAPAROTOMY N/A 03/12/2020   Procedure: EXPLORATORY LAPAROTOMY WITH BOWEL RESECTION AND COLOSTOMY;  Surgeon: [name], MD;  Location: [facility];  Service: General;  Laterality: N/A;   OPEN REDUCTION INTERNAL FIXATION (ORIF) DISTAL RADIAL FRACTURE Right 11/01/2018   Procedure: OPEN REDUCTION INTERNAL FIXATION (ORIF) DISTAL RADIAL FRACTURE;  Surgeon: [name], MD;  Location: [facility];  Service: Orthopedics;  Laterality: Right;   TONSILLECTOMY  1960   recurrent otitis media   US ECHOCARDIOGRAPHY  02/2020    poor windows, normal LV function, severely dilated RV with moderately reduced function, RV volume and pressure overload, mildly dilated RA    I have reviewed the social history and family history with the patient and they are unchanged from previous note.  ALLERGIES:  is allergic to amlodipine and codeine.  MEDICATIONS:  Current Outpatient Medications  Medication Sig Dispense Refill   HYDROcodone bit-homatropine (HYDROMET) 5-1.5 MG/5ML syrup Take 5 mLs by mouth every 6 (six) hours as needed for cough. 120 mL 0   predniSONE (DELTASONE) 10 MG tablet Take 4 tablets (40 mg total) by mouth daily with breakfast for 5 days. 20 tablet 0   albuterol (PROVENTIL) (2.5 MG/3ML) 0.083% nebulizer solution Take 3 mLs (2.5 mg total) by nebulization every 4 (four) hours as needed for wheezing or shortness of breath. DX: J44.9 360 mL 5   albuterol (VENTOLIN HFA) 108 (90 Base) MCG/ACT inhaler Inhale 2 puffs into the lungs every 6 (six) hours as needed for wheezing or shortness of breath. 8 g 5   ALPRAZolam (XANAX) 0.25 MG tablet Take 1 tablet (0.25 mg total) by mouth at bedtime as needed for anxiety. 30 tablet 0   amoxicillin-clavulanate (AUGMENTIN) 875-125 MG tablet Take 1 tablet by mouth 2 (two) times daily. 14 tablet 0   capecitabine (XELODA) 500 MG tablet Take 3 tablets (1,500 mg total) by mouth 2 (two) times daily after a meal. Take for 7 days on, 7 days off, repeat every 14 days. 84 tablet 1   diphenhydrAMINE (BENADRYL) 25 mg capsule Take 25 mg by mouth every 6 (six) hours as needed.     docusate sodium (COLACE) 50 MG capsule Take 50 mg by mouth daily as needed for mild constipation.     doxycycline (VIBRA-TABS) 100 MG tablet Take 1 tablet (100 mg total) by mouth 2 (two) times daily. 14 tablet 0   fluticasone (FLONASE) 50 MCG/ACT nasal  spray Place 2 sprays into both nostrils daily. 16 mL 5   Fluticasone-Umeclidin-Vilant (TRELEGY ELLIPTA) 100-62.5-25 MCG/INH AEPB Inhale 1 puff into the lungs daily. 60 each 2   lidocaine-prilocaine (EMLA) cream Apply 1 application topically as needed. 30 g 0   Nirmatrelvir & Ritonavir (PAXLOVID) 20 x 150 MG & 10 x 100MG TBPK Take 3 tablets by mouth 2 (two) times daily. 30 tablet 0   ondansetron (ZOFRAN) 8 MG tablet Take 1 tablet (8 mg total) by mouth every 8 (eight) hours as needed for nausea or vomiting. 20 tablet 2   traMADol (ULTRAM) 50 MG tablet Take 1  tablet (50 mg total) by mouth every 6 (six) hours as needed. 30 tablet 0   No current facility-administered medications for this visit.   Facility-Administered Medications Ordered in Other Visits  Medication Dose Route Frequency Provider Last Rate Last Admin   heparin lock flush 100 unit/mL  500 Units Intracatheter Once PRN [name], MD       oxaliplatin (ELOXATIN) 115 mg in dextrose 5 % 500 mL chemo infusion  70 mg/m2 (Treatment Plan Recorded) Intravenous Once [name], MD 262 mL/hr at 06/23/21 1401 115 mg at 06/23/21 1401   sodium chloride flush (NS) 0.9 % injection 10 mL  10 mL Intracatheter PRN [name], MD        PHYSICAL EXAMINATION: ECOG PERFORMANCE STATUS: 2 - Symptomatic, <50% confined to bed  Vitals:   06/23/21 1035  BP: (!) 143/81  Pulse: 98  Resp: 18  Temp: 97.6 F (36.4 C)  SpO2: 98%   Filed Weights   06/23/21 1035  Weight: 123 lb 9.6 oz (56.1 kg)    GENERAL:alert, no distress and comfortable SKIN: mild chest erythema/flushing EYES:  sclera clear LUNGS: decreased throughout, normal breathing effort HEART: regular rate & rhythm, no lower extremity edema ABDOMEN:abdomen soft, non-tender and normal bowel sounds. Small amount brown stool in collection bag NEURO: alert & oriented x 3 with fluent speech, no focal motor/sensory deficits PAC without erythema   LABORATORY DATA:  I have reviewed the data as  listed CBC Latest Ref Rng & Units 06/23/2021 05/19/2021 05/05/2021  WBC 4.0 - 10.5 K/uL 8.7 9.7 13.6(H)  Hemoglobin 12.0 - 15.0 g/dL 12.4 12.6 12.1  Hematocrit 36.0 - 46.0 % 36.8 38.8 36.0  Platelets 150 - 400 K/uL 288 286 255     CMP Latest Ref Rng & Units 06/23/2021 05/19/2021 05/05/2021  Glucose 70 - 99 mg/dL 90 90 108(H)  BUN 8 - 23 mg/dL 15 17 15   Creatinine 0.44 - 1.00 mg/dL 0.83 1.04(H) 0.86  Sodium 135 - 145 mmol/L 140 142 140  Potassium 3.5 - 5.1 mmol/L 4.5 4.1 4.3  Chloride 98 - 111 mmol/L 106 107 103  CO2 22 - 32 mmol/L 25 23 26   Calcium 8.9 - 10.3 mg/dL 9.9 9.7 9.4  Total Protein 6.5 - 8.1 g/dL 7.0 7.2 6.8  Total Bilirubin 0.3 - 1.2 mg/dL 0.3 0.3 0.7  Alkaline Phos 38 - 126 U/L 105 99 97  AST 15 - 41 U/L 22 19 18   ALT 0 - 44 U/L 10 11 12       RADIOGRAPHIC STUDIES: I have personally reviewed the radiological images as listed and agreed with the findings in the report. DG Chest 2 View  Result Date: 06/23/2021 CLINICAL DATA:  Cough after 2 rounds of antibiotics. History of metastatic rectal cancer EXAM: CHEST - 2 VIEW COMPARISON:  06/05/2021 FINDINGS: Emphysema with basal predominance. There is no edema, consolidation, effusion, or pneumothorax. Pulmonary nodules reported on recent PET-CT. Normal heart size and mediastinal contours. Porta catheter with tip at the upper cavoatrial junction. IMPRESSION: 1. No acute finding. 2. Emphysema. Electronically Signed   By: Monte Fantasia M.D.   On: 06/23/2021 11:48     ASSESSMENT & PLAN: 70 yo female   1. [name], MMR normal, peritoneal metastasis in 01/2021 -diagnosed 02/2020, due to perforation underwent emergent surgery. Path showed invasive adenocarcinoma with perforation, +3/15 LNs, and clear margins. Initial CT scan negative for distant metastasis. -due to bowel perforation and + LNs she has very high risk  for recurrence, unfortunately she was not a candidate for adjuvant chemo due to very  slow recovery from surgery -Guardant Reveal ctDNA were all negative x3 (06/06/20, 07/12/20, and 09/10/20). -surveillance CT 08/27/20 showed a small density adjacent to the spleen that had decreased, no other evidence of recurrent or metastatic disease -first surveillance colonoscopy by Dr. Havery Moros 01/06/21 showed new adenocarcinoma at appendiceal orifice and other tubular adenomas throughout the colon.  Biopsy confirmed adenocarcinoma -01/27/2021 PET showed tumor in appendiceal orifice is likely peritoneal metastasis growing into the cecum.  Her peritoneal implants are mainly in the pelvis, difficult to biopsy, Dr. Burr Medico did not feel strongly that she needed a biopsy to confirm -Dr. Burr Medico recommended first-line Capox, she began single agent Xeloda with cycle 1 on 2/19.  Due to low performance status with cycle 2 was changed to 1 week on/1 week off, and beva was added. She tolerates this well -PS improved and low dose oxaliplatin was added with C4, now on Capeox/beva q2 weeks, with Xeloda 1 week on/1 week off. -PET scan 05/08/2021 showed good response and peritoneal metastasis, with the development of several FDG avid pulmonary nodules, and hypermetabolic left paratracheal node, the differential includes infectious/inflammatory versus thoracic metastasis -she was given augmentin in the event it could be infectious -She subsequently developed COVID-19 and respiratory infection last chemo 05/19/2021  2. COVID-19 + 05/21/21 and respiratory infection -she received CAPOX/beva on 05/19/21, tested positive on 05/21/21 -treated with paxlovid -cough improved initially then worsened and became productive, cxr 06/05/21 showed no acute change -she was recently on Augmentin, with new sx on 06/05/21 I prescribed doxycycline -she has persistent cough since then, cxr today again negative except emphysema -will refer back to pulmonology    2. H/o provoked RUE DVT 04/04/20 s/p 3 months AC (Eliquis)   3. Comorbidities: Asthma,  COPD, anxiety -on nebulizer and trelegy, has not needed rescue inhaler recently -f/up pulmonology and PCP  Disposition: Ms. Freiman appears stable, last CAPOX/Beva 05/19/21.  She subsequently tested positive for COVID-19 and completed Paxlovid. She developed productive cough and completed doxycycline. She tolerated last cycle and has recovered from COVID-19 except persistent cough despite 2 courses of antibiotics. There is no clinical evidence of cancer progression.   Chest x-ray from today shows stable emphysema, no other acute process.  I recommend to follow-up with pulmonology. Case discussed with Dr. Valeta Harms who recommends short course of prednisone 40 mg daily x5 days, Rx sent today. We will prescribe a trial of cough suppressant which has helped her in the past and repeat CT chest before next cycle.   Labs reviewed, CBC and CMP are stable, CEA remains normal.  Urine protein and labs adequate to proceed with oxali/bevacizumab today as planned, and begin Xeloda this evening, she takes 1000 mg AM/1500 mg PM 1 week on and 1 week off.  F/up in 2 weeks with next cycle for close monitoring.    Orders Placed This Encounter  Procedures   DG Chest 2 View    Standing Status:   Future    Number of Occurrences:   1    Standing Expiration Date:   06/23/2022    Order Specific Question:   Reason for Exam (SYMPTOM  OR DIAGNOSIS REQUIRED)    Answer:   persistent cough s/p 2 round abx, emphysema. metastatic rectal cancer    Order Specific Question:   Preferred imaging location?    Answer:   [facility]   CT Chest W Contrast    Standing Status:  Future    Standing Expiration Date:   06/23/2022    Order Specific Question:   If indicated for the ordered procedure, I authorize the administration of contrast media per Radiology protocol    Answer:   Yes    Order Specific Question:   Preferred imaging location?    Answer:   [facility]   All questions were answered. The patient knows to  call the clinic with any problems, questions or concerns. No barriers to learning were detected.  Total encounter time was 30 minutes.     Alla Feeling, NP 06/23/21

## 2021-06-23 ENCOUNTER — Inpatient Hospital Stay: Payer: Medicare HMO

## 2021-06-23 ENCOUNTER — Other Ambulatory Visit: Payer: Self-pay

## 2021-06-23 ENCOUNTER — Ambulatory Visit (HOSPITAL_COMMUNITY)
Admission: RE | Admit: 2021-06-23 | Discharge: 2021-06-23 | Disposition: A | Discharge status: Home / Self Care | Payer: Medicare HMO | Source: Ambulatory Visit | Attending: Nurse Practitioner | Admitting: Nurse Practitioner

## 2021-06-23 ENCOUNTER — Encounter: Payer: Self-pay | Admitting: Nurse Practitioner

## 2021-06-23 ENCOUNTER — Inpatient Hospital Stay: Payer: Medicare HMO | Attending: Hematology

## 2021-06-23 ENCOUNTER — Inpatient Hospital Stay: Payer: Medicare HMO | Admitting: Nurse Practitioner

## 2021-06-23 VITALS — BP 143/81 | HR 98 | Temp 97.6°F | Resp 18 | Wt 123.6 lb

## 2021-06-23 DIAGNOSIS — C20 Malignant neoplasm of rectum: Secondary | ICD-10-CM

## 2021-06-23 DIAGNOSIS — Z79899 Other long term (current) drug therapy: Secondary | ICD-10-CM | POA: Insufficient documentation

## 2021-06-23 DIAGNOSIS — C799 Secondary malignant neoplasm of unspecified site: Secondary | ICD-10-CM | POA: Diagnosis not present

## 2021-06-23 DIAGNOSIS — Z7901 Long term (current) use of anticoagulants: Secondary | ICD-10-CM | POA: Diagnosis not present

## 2021-06-23 DIAGNOSIS — C786 Secondary malignant neoplasm of retroperitoneum and peritoneum: Secondary | ICD-10-CM | POA: Diagnosis not present

## 2021-06-23 DIAGNOSIS — M8580 Other specified disorders of bone density and structure, unspecified site: Secondary | ICD-10-CM | POA: Diagnosis not present

## 2021-06-23 DIAGNOSIS — C181 Malignant neoplasm of appendix: Secondary | ICD-10-CM | POA: Diagnosis present

## 2021-06-23 DIAGNOSIS — J439 Emphysema, unspecified: Secondary | ICD-10-CM | POA: Diagnosis not present

## 2021-06-23 DIAGNOSIS — Z86718 Personal history of other venous thrombosis and embolism: Secondary | ICD-10-CM | POA: Insufficient documentation

## 2021-06-23 DIAGNOSIS — Z5111 Encounter for antineoplastic chemotherapy: Secondary | ICD-10-CM | POA: Diagnosis present

## 2021-06-23 DIAGNOSIS — Z933 Colostomy status: Secondary | ICD-10-CM | POA: Diagnosis not present

## 2021-06-23 DIAGNOSIS — Z5112 Encounter for antineoplastic immunotherapy: Secondary | ICD-10-CM | POA: Diagnosis not present

## 2021-06-23 DIAGNOSIS — C7800 Secondary malignant neoplasm of unspecified lung: Secondary | ICD-10-CM | POA: Diagnosis not present

## 2021-06-23 DIAGNOSIS — R059 Cough, unspecified: Secondary | ICD-10-CM | POA: Diagnosis not present

## 2021-06-23 DIAGNOSIS — Z7952 Long term (current) use of systemic steroids: Secondary | ICD-10-CM | POA: Insufficient documentation

## 2021-06-23 DIAGNOSIS — J449 Chronic obstructive pulmonary disease, unspecified: Secondary | ICD-10-CM | POA: Insufficient documentation

## 2021-06-23 DIAGNOSIS — I10 Essential (primary) hypertension: Secondary | ICD-10-CM | POA: Insufficient documentation

## 2021-06-23 DIAGNOSIS — R911 Solitary pulmonary nodule: Secondary | ICD-10-CM | POA: Diagnosis not present

## 2021-06-23 LAB — CBC WITH DIFFERENTIAL (CANCER CENTER ONLY)
Abs Immature Granulocytes: 0.03 10*3/uL (ref 0.00–0.07)
Basophils Absolute: 0 10*3/uL (ref 0.0–0.1)
Basophils Relative: 1 %
Eosinophils Absolute: 0.1 10*3/uL (ref 0.0–0.5)
Eosinophils Relative: 1 %
HCT: 36.8 % (ref 36.0–46.0)
Hemoglobin: 12.4 g/dL (ref 12.0–15.0)
Immature Granulocytes: 0 %
Lymphocytes Relative: 16 %
Lymphs Abs: 1.4 10*3/uL (ref 0.7–4.0)
MCH: 34.7 pg — ABNORMAL HIGH (ref 26.0–34.0)
MCHC: 33.7 g/dL (ref 30.0–36.0)
MCV: 103.1 fL — ABNORMAL HIGH (ref 80.0–100.0)
Monocytes Absolute: 0.8 10*3/uL (ref 0.1–1.0)
Monocytes Relative: 9 %
Neutro Abs: 6.5 10*3/uL (ref 1.7–7.7)
Neutrophils Relative %: 73 %
Platelet Count: 288 10*3/uL (ref 150–400)
RBC: 3.57 MIL/uL — ABNORMAL LOW (ref 3.87–5.11)
RDW: 13.7 % (ref 11.5–15.5)
WBC Count: 8.7 10*3/uL (ref 4.0–10.5)
nRBC: 0 % (ref 0.0–0.2)

## 2021-06-23 LAB — CMP (CANCER CENTER ONLY)
ALT: 10 U/L (ref 0–44)
AST: 22 U/L (ref 15–41)
Albumin: 3.3 g/dL — ABNORMAL LOW (ref 3.5–5.0)
Alkaline Phosphatase: 105 U/L (ref 38–126)
Anion gap: 9 (ref 5–15)
BUN: 15 mg/dL (ref 8–23)
CO2: 25 mmol/L (ref 22–32)
Calcium: 9.9 mg/dL (ref 8.9–10.3)
Chloride: 106 mmol/L (ref 98–111)
Creatinine: 0.83 mg/dL (ref 0.44–1.00)
GFR, Estimated: >60 mL/min (ref >60)
Glucose, Bld: 90 mg/dL (ref 70–99)
Potassium: 4.5 mmol/L (ref 3.5–5.1)
Sodium: 140 mmol/L (ref 135–145)
Total Bilirubin: 0.3 mg/dL (ref 0.3–1.2)
Total Protein: 7 g/dL (ref 6.5–8.1)

## 2021-06-23 LAB — CEA (IN HOUSE-CHCC): CEA (CHCC-In House): 2.65 ng/mL (ref 0.00–5.00)

## 2021-06-23 LAB — TOTAL PROTEIN, URINE DIPSTICK: Protein, ur: NEGATIVE mg/dL

## 2021-06-23 MED ORDER — PALONOSETRON HCL INJECTION 0.25 MG/5ML
0.2500 mg | Freq: Once | INTRAVENOUS | Status: AC
  Administered 2021-06-23: 0.25 mg via INTRAVENOUS

## 2021-06-23 MED ORDER — SODIUM CHLORIDE 0.9 % IV SOLN
5.0000 mg/kg | Freq: Once | INTRAVENOUS | Status: AC
  Administered 2021-06-23: 300 mg via INTRAVENOUS
  Filled 2021-06-23: qty 12

## 2021-06-23 MED ORDER — PALONOSETRON HCL INJECTION 0.25 MG/5ML
INTRAVENOUS | Status: AC
  Filled 2021-06-23: qty 5

## 2021-06-23 MED ORDER — SODIUM CHLORIDE 0.9 % IV SOLN
Freq: Once | INTRAVENOUS | Status: AC
  Filled 2021-06-23: qty 250

## 2021-06-23 MED ORDER — DEXTROSE 5 % IV SOLN
Freq: Once | INTRAVENOUS | Status: AC
  Filled 2021-06-23: qty 250

## 2021-06-23 MED ORDER — HEPARIN SOD (PORK) LOCK FLUSH 100 UNIT/ML IV SOLN
500.0000 [IU] | Freq: Once | INTRAVENOUS | Status: AC | PRN
  Administered 2021-06-23: 500 [IU]
  Filled 2021-06-23: qty 5

## 2021-06-23 MED ORDER — HYDROCODONE BIT-HOMATROP MBR 5-1.5 MG/5ML PO SOLN: 5.0000 mL | Freq: Four times a day (QID) | ORAL | 0 refills | Status: DC | PRN

## 2021-06-23 MED ORDER — SODIUM CHLORIDE 0.9% FLUSH
10.0000 mL | Freq: Once | INTRAVENOUS | Status: AC
  Administered 2021-06-23: 10 mL
  Filled 2021-06-23: qty 10

## 2021-06-23 MED ORDER — OXALIPLATIN CHEMO INJECTION 100 MG/20ML
70.0000 mg/m2 | Freq: Once | INTRAVENOUS | Status: AC
  Administered 2021-06-23: 115 mg via INTRAVENOUS
  Filled 2021-06-23: qty 20

## 2021-06-23 MED ORDER — SODIUM CHLORIDE 0.9% FLUSH
10.0000 mL | INTRAVENOUS | Status: DC | PRN
  Administered 2021-06-23: 10 mL
  Filled 2021-06-23: qty 10

## 2021-06-23 MED ORDER — DEXAMETHASONE SODIUM PHOSPHATE 100 MG/10ML IJ SOLN
10.0000 mg | Freq: Once | INTRAMUSCULAR | Status: AC
  Administered 2021-06-23: 10 mg via INTRAVENOUS
  Filled 2021-06-23: qty 10

## 2021-06-23 MED ORDER — PREDNISONE 10 MG PO TABS: 40.0000 mg | ORAL_TABLET | Freq: Every day | ORAL | 0 refills | Status: AC

## 2021-06-23 NOTE — Patient Instructions (Signed)
[facility]  Discharge Instructions: Thank you for choosing [facility] to provide your oncology and hematology care.   If you have a lab appointment with the Cancer Center, please go directly to the Cancer Center and check in at the registration area.   Wear comfortable clothing and clothing appropriate for easy access to any Portacath or PICC line.   We strive to give you quality time with your provider. You may need to reschedule your appointment if you arrive late (15 or more minutes).  Arriving late affects you and other patients whose appointments are after yours.  Also, if you miss three or more appointments without notifying the office, you may be dismissed from the clinic at the provider's discretion.      For prescription refill requests, have your pharmacy contact our office and allow 72 hours for refills to be completed.    Today you received the following chemotherapy and/or immunotherapy agents: Bevacizumab and Oxaliplatin      To help prevent nausea and vomiting after your treatment, we encourage you to take your nausea medication as directed.  BELOW ARE SYMPTOMS THAT SHOULD BE REPORTED IMMEDIATELY: . *FEVER GREATER THAN 100.4 F (38 C) OR HIGHER . *CHILLS OR SWEATING . *NAUSEA AND VOMITING THAT IS NOT CONTROLLED WITH YOUR NAUSEA MEDICATION . *UNUSUAL SHORTNESS OF BREATH . *UNUSUAL BRUISING OR BLEEDING . *URINARY PROBLEMS (pain or burning when urinating, or frequent urination) . *BOWEL PROBLEMS (unusual diarrhea, constipation, pain near the anus) . TENDERNESS IN MOUTH AND THROAT WITH OR WITHOUT PRESENCE OF ULCERS (sore throat, sores in mouth, or a toothache) . UNUSUAL RASH, SWELLING OR PAIN  . UNUSUAL VAGINAL DISCHARGE OR ITCHING   Items with * indicate a potential emergency and should be followed up as soon as possible or go to the Emergency Department if any problems should occur.  Please show the CHEMOTHERAPY ALERT CARD or  IMMUNOTHERAPY ALERT CARD at check-in to the Emergency Department and triage nurse.  Should you have questions after your visit or need to cancel or reschedule your appointment, please contact [facility]  Dept: [phone]  and follow the prompts.  Office hours are 8:00 a.m. to 4:30 p.m. Monday - Friday. Please note that voicemails left after 4:00 p.m. may not be returned until the following business day.  We are closed weekends and major holidays. You have access to a nurse at all times for urgent questions. Please call the main number to the clinic Dept: [phone] and follow the prompts.   For any non-urgent questions, you may also contact your provider using MyChart. We now offer e-Visits for anyone 18 and older to request care online for non-urgent symptoms. For details visit [email or web address].   Also download the MyChart app! Go to the app store, search "MyChart", open the app, select [facility], and log in with your MyChart username and password.  Due to Covid, a mask is required upon entering the hospital/clinic. If you do not have a mask, one will be given to you upon arrival. For doctor visits, patients may have 1 support person aged 18 or older with them. For treatment visits, patients cannot have anyone with them due to current Covid guidelines and our immunocompromised population.   

## 2021-06-24 DIAGNOSIS — Z933 Colostomy status: Secondary | ICD-10-CM | POA: Diagnosis not present

## 2021-06-25 ENCOUNTER — Telehealth: Payer: Self-pay | Admitting: Hematology

## 2021-06-25 DIAGNOSIS — Z933 Colostomy status: Secondary | ICD-10-CM | POA: Diagnosis not present

## 2021-06-25 NOTE — Telephone Encounter (Signed)
Scheduled follow-up appointments per 7/11 los. Patient is aware. 

## 2021-06-26 DIAGNOSIS — Z933 Colostomy status: Secondary | ICD-10-CM | POA: Diagnosis not present

## 2021-06-27 DIAGNOSIS — Z933 Colostomy status: Secondary | ICD-10-CM | POA: Diagnosis not present

## 2021-06-28 DIAGNOSIS — Z933 Colostomy status: Secondary | ICD-10-CM | POA: Diagnosis not present

## 2021-06-29 DIAGNOSIS — Z933 Colostomy status: Secondary | ICD-10-CM | POA: Diagnosis not present

## 2021-06-30 DIAGNOSIS — Z933 Colostomy status: Secondary | ICD-10-CM | POA: Diagnosis not present

## 2021-07-01 DIAGNOSIS — Z933 Colostomy status: Secondary | ICD-10-CM | POA: Diagnosis not present

## 2021-07-07 ENCOUNTER — Encounter: Payer: Self-pay | Admitting: Hematology

## 2021-07-07 ENCOUNTER — Other Ambulatory Visit: Payer: Self-pay

## 2021-07-07 ENCOUNTER — Inpatient Hospital Stay: Payer: Medicare HMO

## 2021-07-07 ENCOUNTER — Inpatient Hospital Stay: Payer: Medicare HMO | Admitting: Hematology

## 2021-07-07 VITALS — BP 143/74 | HR 86 | Temp 97.7°F | Resp 18 | Wt 123.9 lb

## 2021-07-07 DIAGNOSIS — Z5112 Encounter for antineoplastic immunotherapy: Secondary | ICD-10-CM | POA: Diagnosis not present

## 2021-07-07 DIAGNOSIS — C786 Secondary malignant neoplasm of retroperitoneum and peritoneum: Secondary | ICD-10-CM | POA: Diagnosis not present

## 2021-07-07 DIAGNOSIS — C20 Malignant neoplasm of rectum: Secondary | ICD-10-CM | POA: Diagnosis not present

## 2021-07-07 DIAGNOSIS — I10 Essential (primary) hypertension: Secondary | ICD-10-CM | POA: Diagnosis not present

## 2021-07-07 DIAGNOSIS — C799 Secondary malignant neoplasm of unspecified site: Secondary | ICD-10-CM

## 2021-07-07 DIAGNOSIS — C181 Malignant neoplasm of appendix: Secondary | ICD-10-CM | POA: Diagnosis not present

## 2021-07-07 DIAGNOSIS — Z5111 Encounter for antineoplastic chemotherapy: Secondary | ICD-10-CM | POA: Diagnosis not present

## 2021-07-07 DIAGNOSIS — Z86718 Personal history of other venous thrombosis and embolism: Secondary | ICD-10-CM | POA: Diagnosis not present

## 2021-07-07 DIAGNOSIS — Z7901 Long term (current) use of anticoagulants: Secondary | ICD-10-CM | POA: Diagnosis not present

## 2021-07-07 DIAGNOSIS — J449 Chronic obstructive pulmonary disease, unspecified: Secondary | ICD-10-CM | POA: Diagnosis not present

## 2021-07-07 DIAGNOSIS — C7800 Secondary malignant neoplasm of unspecified lung: Secondary | ICD-10-CM | POA: Diagnosis not present

## 2021-07-07 DIAGNOSIS — M8580 Other specified disorders of bone density and structure, unspecified site: Secondary | ICD-10-CM | POA: Diagnosis not present

## 2021-07-07 LAB — CBC WITH DIFFERENTIAL (CANCER CENTER ONLY)
Abs Immature Granulocytes: 0.04 10*3/uL (ref 0.00–0.07)
Basophils Absolute: 0 10*3/uL (ref 0.0–0.1)
Basophils Relative: 0 %
Eosinophils Absolute: 0 10*3/uL (ref 0.0–0.5)
Eosinophils Relative: 0 %
HCT: 33.8 % — ABNORMAL LOW (ref 36.0–46.0)
Hemoglobin: 11.2 g/dL — ABNORMAL LOW (ref 12.0–15.0)
Immature Granulocytes: 1 %
Lymphocytes Relative: 15 %
Lymphs Abs: 1.2 10*3/uL (ref 0.7–4.0)
MCH: 33.9 pg (ref 26.0–34.0)
MCHC: 33.1 g/dL (ref 30.0–36.0)
MCV: 102.4 fL — ABNORMAL HIGH (ref 80.0–100.0)
Monocytes Absolute: 0.9 10*3/uL (ref 0.1–1.0)
Monocytes Relative: 11 %
Neutro Abs: 6 10*3/uL (ref 1.7–7.7)
Neutrophils Relative %: 73 %
Platelet Count: 279 10*3/uL (ref 150–400)
RBC: 3.3 MIL/uL — ABNORMAL LOW (ref 3.87–5.11)
RDW: 14.2 % (ref 11.5–15.5)
WBC Count: 8.2 10*3/uL (ref 4.0–10.5)
nRBC: 0 % (ref 0.0–0.2)

## 2021-07-07 LAB — CMP (CANCER CENTER ONLY)
ALT: 15 U/L (ref 0–44)
AST: 23 U/L (ref 15–41)
Albumin: 3.2 g/dL — ABNORMAL LOW (ref 3.5–5.0)
Alkaline Phosphatase: 105 U/L (ref 38–126)
Anion gap: 8 (ref 5–15)
BUN: 17 mg/dL (ref 8–23)
CO2: 25 mmol/L (ref 22–32)
Calcium: 9.2 mg/dL (ref 8.9–10.3)
Chloride: 105 mmol/L (ref 98–111)
Creatinine: 0.79 mg/dL (ref 0.44–1.00)
GFR, Estimated: >60 mL/min (ref >60)
Glucose, Bld: 98 mg/dL (ref 70–99)
Potassium: 3.9 mmol/L (ref 3.5–5.1)
Sodium: 138 mmol/L (ref 135–145)
Total Bilirubin: 0.4 mg/dL (ref 0.3–1.2)
Total Protein: 6.5 g/dL (ref 6.5–8.1)

## 2021-07-07 LAB — CEA (IN HOUSE-CHCC): CEA (CHCC-In House): 2.78 ng/mL (ref 0.00–5.00)

## 2021-07-07 LAB — TOTAL PROTEIN, URINE DIPSTICK: Protein, ur: 30 mg/dL — AB

## 2021-07-07 MED ORDER — PALONOSETRON HCL INJECTION 0.25 MG/5ML
INTRAVENOUS | Status: AC
  Filled 2021-07-07: qty 5

## 2021-07-07 MED ORDER — HEPARIN SOD (PORK) LOCK FLUSH 100 UNIT/ML IV SOLN
500.0000 [IU] | Freq: Once | INTRAVENOUS | Status: AC | PRN
  Administered 2021-07-07: 500 [IU]
  Filled 2021-07-07: qty 5

## 2021-07-07 MED ORDER — CAPECITABINE 500 MG PO TABS: 1000.0000 mg/m2 | ORAL_TABLET | Freq: Two times a day (BID) | ORAL | 1 refills | Status: DC

## 2021-07-07 MED ORDER — SODIUM CHLORIDE 0.9 % IV SOLN
10.0000 mg | Freq: Once | INTRAVENOUS | Status: AC
  Administered 2021-07-07: 10 mg via INTRAVENOUS
  Filled 2021-07-07: qty 10

## 2021-07-07 MED ORDER — SODIUM CHLORIDE 0.9% FLUSH
10.0000 mL | Freq: Once | INTRAVENOUS | Status: AC
  Administered 2021-07-07: 10 mL
  Filled 2021-07-07: qty 10

## 2021-07-07 MED ORDER — SODIUM CHLORIDE 0.9 % IV SOLN
5.0000 mg/kg | Freq: Once | INTRAVENOUS | Status: AC
  Administered 2021-07-07: 300 mg via INTRAVENOUS
  Filled 2021-07-07: qty 12

## 2021-07-07 MED ORDER — SODIUM CHLORIDE 0.9 % IV SOLN
Freq: Once | INTRAVENOUS | Status: AC
  Filled 2021-07-07: qty 250

## 2021-07-07 MED ORDER — OXALIPLATIN CHEMO INJECTION 100 MG/20ML
70.0000 mg/m2 | Freq: Once | INTRAVENOUS | Status: AC
  Administered 2021-07-07: 115 mg via INTRAVENOUS
  Filled 2021-07-07: qty 20

## 2021-07-07 MED ORDER — PALONOSETRON HCL INJECTION 0.25 MG/5ML
0.2500 mg | Freq: Once | INTRAVENOUS | Status: AC
  Administered 2021-07-07: 0.25 mg via INTRAVENOUS

## 2021-07-07 MED ORDER — SODIUM CHLORIDE 0.9% FLUSH
10.0000 mL | INTRAVENOUS | Status: DC | PRN
  Administered 2021-07-07: 10 mL
  Filled 2021-07-07: qty 10

## 2021-07-07 MED ORDER — DEXTROSE 5 % IV SOLN
Freq: Once | INTRAVENOUS | Status: AC
Start: 2021-07-07 — End: 2021-07-07
  Filled 2021-07-07: qty 250

## 2021-07-07 NOTE — Patient Instructions (Signed)
[facility]  Discharge Instructions: Thank you for choosing [facility] to provide your oncology and hematology care.   If you have a lab appointment with the [facility], please go directly to the [facility] and check in at the registration area.   Wear comfortable clothing and clothing appropriate for easy access to any Portacath or PICC line.   We strive to give you quality time with your provider. You may need to reschedule your appointment if you arrive late (15 or more minutes).  Arriving late affects you and other patients whose appointments are after yours.  Also, if you miss three or more appointments without notifying the office, you may be dismissed from the clinic at the provider's discretion.      For prescription refill requests, have your pharmacy contact our office and allow 72 hours for refills to be completed.    Today you received the following chemotherapy and/or immunotherapy agents Avastin and Oxaliplatin      To help prevent nausea and vomiting after your treatment, we encourage you to take your nausea medication as directed.  BELOW ARE SYMPTOMS THAT SHOULD BE REPORTED IMMEDIATELY: *FEVER GREATER THAN 100.4 F (38 C) OR HIGHER *CHILLS OR SWEATING *NAUSEA AND VOMITING THAT IS NOT CONTROLLED WITH YOUR NAUSEA MEDICATION *UNUSUAL SHORTNESS OF BREATH *UNUSUAL BRUISING OR BLEEDING *URINARY PROBLEMS (pain or burning when urinating, or frequent urination) *BOWEL PROBLEMS (unusual diarrhea, constipation, pain near the anus) TENDERNESS IN MOUTH AND THROAT WITH OR WITHOUT PRESENCE OF ULCERS (sore throat, sores in mouth, or a toothache) UNUSUAL RASH, SWELLING OR PAIN  UNUSUAL VAGINAL DISCHARGE OR ITCHING   Items with * indicate a potential emergency and should be followed up as soon as possible or go to the Emergency Department if any problems should occur.  Please show the CHEMOTHERAPY ALERT CARD or IMMUNOTHERAPY ALERT CARD at  check-in to the Emergency Department and triage nurse.  Should you have questions after your visit or need to cancel or reschedule your appointment, please contact [facility]  Dept: [phone]  and follow the prompts.  Office hours are 8:00 a.m. to 4:30 p.m. Monday - Friday. Please note that voicemails left after 4:00 p.m. may not be returned until the following business day.  We are closed weekends and major holidays. You have access to a nurse at all times for urgent questions. Please call the main number to the clinic Dept: [phone] and follow the prompts.   For any non-urgent questions, you may also contact your provider using MyChart. We now offer e-Visits for anyone 62 and older to request care online for non-urgent symptoms. For details visit [email or web address].   Also download the MyChart app! Go to the app store, search "MyChart", open the app, select [facility], and log in with your MyChart username and password.  Due to Covid, a mask is required upon entering the hospital/clinic. If you do not have a mask, one will be given to you upon arrival. For doctor visits, patients may have 1 support person aged 66 or older with them. For treatment visits, patients cannot have anyone with them due to current Covid guidelines and our immunocompromised population.

## 2021-07-07 NOTE — Progress Notes (Signed)
Protein noted in urine.  Per Dr. Burr Medico "it's 38, still OK to treat."

## 2021-07-07 NOTE — Progress Notes (Signed)
[place]   [phone] [phone]   Clinic Follow up Note   Patient Care Team: [name], DO as PCP - General (Family Medicine) [name], MD as Consulting Physician (Hematology) Icard, Octavio Graves, DO as Consulting Physician (Pulmonary Disease) [facility] [name], MD as Consulting Physician (General Surgery) [name], MD as Consulting Physician (General Surgery) [name], [name], MD as Consulting Physician (Gastroenterology)  Date of Service:  07/07/2021  CHIEF COMPLAINT: f/u of rectal cancer  SUMMARY OF ONCOLOGIC HISTORY: Oncology History Overview Note  Cancer Staging Rectal cancer [facility]) Staging form: Colon and Rectum, AJCC 8th Edition - Pathologic stage from 03/03/2020: Stage IIIB (pT4a, pN1b, cM0) - Signed by [name], MD on 01/28/2021 Stage prefix: Initial diagnosis Histologic grading system: 4 grade system Histologic grade (G): G2 Residual tumor (R): R0 - None    Rectal cancer ([place])  03/03/2020 - 04/05/2020 Hospital Admission   She was admitted to ED on 03/03/20 for abdominal pain and nausea. She had been having diarrhea for several weeks. During hospital stay she developed acute respiratory failure, AKI, RUE DVT from PICC line. Work up showed bowel perforation, small left liver mass and thickening of jejunum. She underwent emergent surgery on 03/03/20 for resection and colostomy placement. Her path showed invasive cancer, metastatic to 3/15 LNs. She had a NGT in placed but this was removed on POD 3. Post op her stoma became necrotic and septic. She had another bowel surgery on 03/12/20, which was NED with necrotic tissue.    03/03/2020 Imaging   CT AP 03/03/20  IMPRESSION: 1. Free intraperitoneal air consistent perforation. Most likely site of perforation is in the distal splenic flexure/proximal descending colon where there is a collection of air and gas measuring 6 centimeters. No obvious soft  tissue mass identified in this region. At this site, there is abrupt transition of dilated, stool-filled colon to completely decompressed proximal descending colon. 2. Thickened, inflamed loops of jejunum are identified within the pelvis and are likely reactive. 3. Small hiatal hernia. 4. Benign-appearing 1.6 centimeter mass the LEFT hepatic lobe. Recommend comparison with prior studies if available. 5.  Emphysema (ICD10-J43.9). 6.  Aortic Atherosclerosis (ICD10-I70.0). 7. Bilateral renal scarring.   03/03/2020 Surgery   low anterior resection end colostomy by Dr Marcello Moores    03/03/2020 Initial Biopsy   FINAL MICROSCOPIC DIAGNOSIS: 03/03/20 A. RECTOSIGMOID COLON, LOW ANTERIOR RESECTION:  - Invasive colonic adenocarcinoma, 3.5 cm.  - Tumor invades the visceral peritoneum.  - Margins of resection are not involved.  - Metastatic carcinoma in (3) of (15) lymph nodes.  - See oncology table.   B. ADDITIONAL SIGMOID COLON, RESECTION:  - Colonic tissue, negative for carcinoma.  ADDENDUM:  Mismatch Repair Protein (IHC)   SUMMARY INTERPRETATION: NORMAL  There is preserved expression of the major MMR proteins. There is a very  low probability that microsatellite instability (MSI) is present.  However, certain clinically significant MMR protein mutations may result  in preservation of nuclear expression. It is recommended that the  preservation of protein expression be correlated with molecular based  MSI testing.   IHC EXPRESSION RESULTS  TEST           RESULT  MLH1:          Preserved nuclear expression  MSH2:          Preserved nuclear expression  MSH6:          Preserved nuclear expression  PMS2:  Preserved nuclear expression   03/03/2020 Cancer Staging   Staging form: Colon and Rectum, AJCC 8th Edition - Pathologic stage from 03/03/2020: Stage IIIB (pT4a, pN1b, cM0) - Signed by [name], MD on 01/28/2021  Stage prefix: Initial diagnosis  Histologic grading system: 4 grade  system  Histologic grade (G): G2  Residual tumor (R): R0 - None    03/08/2020 Imaging   CT AP 03/08/20 IMPRESSION: 1. Interval midline laparotomy with distal colon resection and diverting left lower quadrant colostomy. 2. Diffuse small bowel dilatation with gas fluid levels most consistent with postoperative ileus. 3. Trace ascites within the abdomen and pelvis. No fluid collection or abscess at this time. Surgical drain within the lower pelvis. 4. Indeterminate 1.4 cm subcapsular liver hypodensity. In light of newly diagnosed rectal cancer, metastatic disease cannot be excluded. PET CT may be useful for further evaluation. 5. Interval development of trace bilateral pleural effusions and diffuse body wall edema.   03/12/2020 Surgery   EXPLORATORY LAPAROTOMY WITH BOWEL RESECTION AND COLOSTOMY by Dr Hassell Done 03/12/20  FINAL MICROSCOPIC DIAGNOSIS: 03/12/20  A. COLON, SPLENIC FLEXURE, RESECTION:  - Segment of colon (37 cm) with perforation and associated inflammation  - Multiple mucosal ulcers with necrotizing inflammation  - No evidence of malignancy    03/12/2020 Imaging   CT AP WO contrast 03/12/20  IMPRESSION: 1. Exam is limited by lack of intravenous contrast, motion, and artifact from the patient's arms adjacent to the torso. Since 03/08/2020, there has been development of relatively large pockets of intraperitoneal free air. While intraperitoneal gas would not be unexpected on postoperative day 9, this gas is new since an intervening study of 03/08/2020 and given the relatively large volume certainly raises concern for bowel perforation. No source for the intraperitoneal free air is evident on this study. There is a surgical drain in the pelvis, but there is no free gas around the drain itself to suggest that it represents the source. 2. New circumferential wall thickening in the splenic flexure, descending colon and sigmoid colon leading into the end  colostomy. Infection/inflammation would be a consideration. Ischemia cannot be excluded. 3. Relatively small volume intraperitoneal free fluid. High attenuation small fluid collections in the left upper abdomen may reflect hemorrhage, infection or residua from prior perforation. 4. Interval progression of diffuse body wall edema. 5. Residual contrast material in the renal parenchyma from prior imaging, compatible with renal dysfunction.    03/19/2020 Imaging   CT CAP 03/19/20  IMPRESSION: 1. 5.5 x 3.2 cm fluid collection is noted along the greater curvature of the proximal stomach. 2. Surgical drain is again noted in the pelvis with tip in left lower quadrant. 3. Interval development of crescent-shaped fluid collection measuring 16.4 x 3.3 cm in the epigastric region and left upper quadrant of the abdomen which may extend into the left lower quadrant. Potentially this may represent abscess or developing abscess. Multiple other smaller fluid collections are noted which may represent small abscesses. 4. 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 5. Mild amount of free fluid is noted in the posterior pelvis. 6. Moderate anasarca is noted. Aortic Atherosclerosis (ICD10-I70.0).   04/12/2020 Imaging   CT AP 04/12/20  IMPRESSION: 1. Fluid collections within the LEFT abdomen have significantly decreased compared to previous CT exams, now nearly completely resolved. 2. Percutaneous drainage catheter with tip coiled posterior to the LEFT kidney, stable positioning compared to the previous study. The more anterior catheter has been removed. 3. Small amount of free fluid persists within  the abdomen and pelvis. 4. Trace bibasilar pleural effusions. 5. Anasarca. 6. While reviewing today's study, comparing with a chest/abdomen/pelvis CT from earlier same month, there is question of thrombus in the LEFT internal jugular vein. Recommend ultrasound of the LEFT IJ to exclude DVT. This  recommendation discussed with patient's hospitalist, Dr. Owens Shark, on 04/12/2020 at 4:20 p.m.   Emphysema (ICD10-J43.9).   04/22/2020 Imaging   CT AP 04/22/20  IMPRESSION: 1. No recurrent intra-abdominal abscess status post interval removal of left retroperitoneal percutaneous drain. Stable small amount of free pelvic fluid. 2. Enlarging dependent bilateral pleural effusions, now moderate in volume. Associated atelectasis at both lung bases. 3. Progressive anasarca with generalized edema throughout the subcutaneous fat. 4. Stable small subcapsular fluid collection along the anterior aspect of the left hepatic lobe. 5. Aortic Atherosclerosis (ICD10-I70.0).   05/23/2020 Initial Diagnosis   Rectal cancer ([place])   01/14/2021 Imaging   CT CAP IMPRESSION: -5.0 cm soft tissue mass along the posterior aspect of the cecum at the base of the appendix, favored to reflect a peritoneal/serosal implant, corresponding to the patient's known adenocarcinoma. This is new from the prior. -Suspected additional pelvic implants along the uterus and right adnexa, poorly visualized, new/progressive from the prior. -Additional pericapsular lesion along the lateral spleen is mildly progressive, suspicious for additional peritoneal implant. -No evidence of metastatic disease in the chest.   01/27/2021 PET scan   IMPRESSION:  1. Extensive hypermetabolic peritoneal metastasis, as detailed  above.  2. No evidence of hypermetabolic supradiaphragmatic disease.  3. Diffuse low-level thyroid hypermetabolism can be seen in the  setting of thyroiditis. Consider correlation with thyroid function  labs.  4. Aortic atherosclerosis (ICD10-I70.0) and emphysema (ICD10-J43.9).    02/01/2021 -  Chemotherapy    First-line chemo Xeloda 1500 mg twice daily for day 1-14, every 21 days, started on 02/01/2021. Reduced to 1 week on/ 1 week off and added bevacizumab q2weeks from C2 on 02/24/21.  ----Given good tolerance, I changed to  CAPOX and bevacizumab q2weeks from C4 on 03/24/21 with Xeloda 1563m in the AM and 10061min the PM 1 week on/1 week off.     05/08/2021 Imaging   PET  IMPRESSION: 1. There is been interval development of several FDG avid pulmonary nodules within both lungs. Additionally, there is new FDG avid low left paratracheal lymph node. Thoracic metastasis cannot be excluded. 2. Interval improvement in multifocal FDG avid peritoneal metastasis as detailed above.     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 - Polypoid lesion obliterating appendiceal orifice. Biopsied to evaluate for malignancy. - The examined portion of the ileum was normal. - Two large polyps in the cecum, not removed today pending path at appendiceal orifice, bowel prep. If removed endoscopically in the future would favor EMR to be done at the hospital. - One 5 mm polyp at the ileocecal valve, removed with a cold snare. Resected and retrieved. - One 3 mm polyp in the transverse colon, removed with a cold snare. Resected and retrieved. - Parastomal hernia making initial entry to the distal colon challenging. - The examination was otherwise normal.   01/06/2021 Initial Biopsy   Diagnosis 1. Colon, biopsy, appendiceal oraface - ADENOCARCINOMA. 2. Colon, polyp(s), ileocecal valve, transverse, x2 - TUBULAR ADENOMA, NEGATIVE FOR HIGH GRADE DYSPLASIA (X1). - COLONIC MUCOSA WITH UNDERLYING LYMPHOID AGGREGATE, NEGATIVE FOR DYSPLASIA (X1).  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 This is new from  the prior. -Suspected additional pelvic implants along the uterus and right adnexa, poorly visualized, new/progressive from the prior. -Additional pericapsular lesion along the lateral spleen is mildly progressive, suspicious for additional peritoneal implant. -No evidence of metastatic disease in the chest.      CURRENT THERAPY:  First-line chemo Xeloda 1500 mg twice daily for day 1-14, every 21 days, started on 02/01/2021. Reduced to 1 week on/ 1 week off and added bevacizumab q2weeks from C2 on 02/24/21. Given good tolerance, changed to CAPOX and bevacizumab q2weeks from C4 on 03/24/21 with Xeloda 1546m in the AM and 1009min the PM 1 week on/1 week off.   INTERVAL HISTORY:  DoCOLBY CATANESEs here for a follow up of rectal cancer. She was last seen by me on 05/19/21 and by NP Lacie on 06/23/21 in the interim. She presents to the clinic alone. She reports doing well overall. She denies any neuropathy from chemo. She reports a mild residual cough from Covid. She [place] her [place] before they go back to school.   All other systems were reviewed with the patient and are negative.  MEDICAL HISTORY:  Past Medical History:  Diagnosis Date   Acute deep vein thrombosis (DVT) of brachial vein of right upper extremity (HCC)    Acute on chronic respiratory failure with hypoxia (HCC)    Asthma    Cataract    CHICK[place], HX OF 01/06/2011   Qualifier: Diagnosis of  By: BlCharlett BlakeD, Stacey     COPD (chronic obstructive pulmonary disease) ([facility]   FeV1 31% predicted FeV1/FVX 47 %   Essential hypertension 05/23/2020   Hemorrhoid    Open right radial fracture 2020   Osteopenia    Perforated sigmoid colon ([facility]3/21/2021   Pleural effusion    Pneumonia 2021   Postoperative intra-abdominal abscess 2021   Rectal cancer (HCC)    Seasonal allergies    takes Claritin daily prn   Shock circulatory ([facility]   Tracheostomy status ([facility]   Vitamin D  deficiency    takes Vit d every 14 days    SURGICAL HISTORY: Past Surgical History:  Procedure Laterality Date   AUGMENTATION MAMMAPLASTY     saline   EXAMINATION UNDER ANESTHESIA  10/14/2012   Procedure: EXAM UNDER ANESTHESIA;  Surgeon: ErGayland CurryMD,FACS;  [facility] Service: General;  Laterality: N/A;  rectal exam under anesthesia excisional hemorrhoidectomy hemorrhoidal banding x two   EXAMINATION UNDER ANESTHESIA  02/03/2013   excision hemorrhoidal tissue   FOOT SURGERY     left bunionectomy   HEMORRHOIDECTOMY WITH HEMORRHOID BANDING  10/14/2012   Procedure: HEMORRHOIDECTOMY WITH HEMORRHOID BANDING;  Surgeon: ErGayland CurryMD,FACS;  [place] [facility] Service: General;  Laterality: N/A;   IR IMAGING GUIDED PORT INSERTION  02/03/2021   IR THORACENTESIS ASP PLEURAL SPACE W/IMG GUIDE  04/23/2020   LAPAROTOMY N/A 03/03/2020   Procedure: low anterior resection end colostomy;  Surgeon: ThLeighton RuffMD;  Location: [facility];  Service: General;  Laterality: N/A;   LAPAROTOMY N/A 03/12/2020   Procedure: EXPLORATORY LAPAROTOMY WITH BOWEL RESECTION AND COLOSTOMY;  Surgeon: [name], MD;  Location: [facility];  Service: General;  Laterality: N/A;   OPEN REDUCTION INTERNAL FIXATION (ORIF) DISTAL RADIAL FRACTURE Right 11/01/2018   Procedure: OPEN REDUCTION INTERNAL FIXATION (ORIF) DISTAL RADIAL FRACTURE;  Surgeon: [name], MD;  Location: [place];  Service: Orthopedics;  Laterality: Right;   TONSILLECTOMY  1960   recurrent otitis media   US ECHOCARDIOGRAPHY  02/2020    poor windows, normal LV function, severely dilated RV with moderately reduced function, RV volume and pressure overload, mildly dilated RA    I have reviewed the social history and family history with the patient and they are unchanged from previous note.  ALLERGIES:  is allergic to amlodipine and codeine.  MEDICATIONS:  Current Outpatient Medications  Medication Sig Dispense Refill   albuterol  (PROVENTIL) (2.5 MG/3ML) 0.083% nebulizer solution Take 3 mLs (2.5 mg total) by nebulization every 4 (four) hours as needed for wheezing or shortness of breath. DX: J44.9 360 mL 5   albuterol (VENTOLIN HFA) 108 (90 Base) MCG/ACT inhaler Inhale 2 puffs into the lungs every 6 (six) hours as needed for wheezing or shortness of breath. 8 g 5   ALPRAZolam (XANAX) 0.25 MG tablet Take 1 tablet (0.25 mg total) by mouth at bedtime as needed for anxiety. 30 tablet 0   capecitabine (XELODA) 500 MG tablet Take 3 tablets (1,500 mg total) by mouth 2 (two) times daily after a meal. Take for 7 days on, 7 days off, repeat every 14 days. 84 tablet 1   diphenhydrAMINE (BENADRYL) 25 mg capsule Take 25 mg by mouth every 6 (six) hours as needed.     docusate sodium (COLACE) 50 MG capsule Take 50 mg by mouth daily as needed for mild constipation.     fluticasone (FLONASE) 50 MCG/ACT nasal spray Place 2 sprays into both nostrils daily. 16 mL 5   Fluticasone-Umeclidin-Vilant (TRELEGY ELLIPTA) 100-62.5-25 MCG/INH AEPB Inhale 1 puff into the lungs daily. 60 each 2   HYDROcodone bit-homatropine (HYDROMET) 5-1.5 MG/5ML syrup Take 5 mLs by mouth every 6 (six) hours as needed for cough. 120 mL 0   lidocaine-prilocaine (EMLA) cream Apply 1 application topically as needed. 30 g 0   ondansetron (ZOFRAN) 8 MG tablet Take 1 tablet (8 mg total) by mouth every 8 (eight) hours as needed for nausea or vomiting. 20 tablet 2   traMADol (ULTRAM) 50 MG tablet Take 1 tablet (50 mg total) by mouth every 6 (six) hours as needed. 30 tablet 0   No current facility-administered medications for this visit.   Facility-Administered Medications Ordered in Other Visits  Medication Dose Route Frequency Provider Last Rate Last Admin   sodium chloride flush (NS) 0.9 % injection 10 mL  10 mL Intracatheter PRN [name], MD   10 mL at 07/07/21 1634    PHYSICAL EXAMINATION: ECOG PERFORMANCE STATUS: 1 - Symptomatic but completely ambulatory  Vitals:    07/07/21 1133  BP: (!) 143/74  Pulse: 86  Resp: 18  Temp: 97.7 F (36.5 C)  SpO2: 96%   Filed Weights   07/07/21 1133  Weight: 123 lb 14.4 oz (56.2 kg)    Due to COVID19 we will limit examination to appearance. Patient had no complaints.  GENERAL:alert, no distress and comfortable SKIN: skin color normal, no rashes or significant lesions EYES: normal, Conjunctiva are pink and non-injected, sclera clear  NEURO: alert & oriented x 3 with fluent speech  LABORATORY DATA:  I have reviewed the data as listed CBC  Latest Ref Rng & Units 07/07/2021 06/23/2021 05/19/2021  WBC 4.0 - 10.5 K/uL 8.2 8.7 9.7  Hemoglobin 12.0 - 15.0 g/dL 11.2(L) 12.4 12.6  Hematocrit 36.0 - 46.0 % 33.8(L) 36.8 38.8  Platelets 150 - 400 K/uL 279 288 286     CMP Latest Ref Rng & Units 07/07/2021 06/23/2021 05/19/2021  Glucose 70 - 99 mg/dL 98 90 90  BUN 8 - 23 mg/dL 17 15 17   Creatinine 0.44 - 1.00 mg/dL 0.79 0.83 1.04(H)  Sodium 135 - 145 mmol/L 138 140 142  Potassium 3.5 - 5.1 mmol/L 3.9 4.5 4.1  Chloride 98 - 111 mmol/L 105 106 107  CO2 22 - 32 mmol/L 25 25 23   Calcium 8.9 - 10.3 mg/dL 9.2 9.9 9.7  Total Protein 6.5 - 8.1 g/dL 6.5 7.0 7.2  Total Bilirubin 0.3 - 1.2 mg/dL 0.4 0.3 0.3  Alkaline Phos 38 - 126 U/L 105 105 99  AST 15 - 41 U/L 23 22 19   ALT 0 - 44 U/L 15 10 11       RADIOGRAPHIC STUDIES: I have personally reviewed the radiological images as listed and agreed with the findings in the report. No results found.   ASSESSMENT & PLAN:  Tracy [name] is a 70 y.o. female with   1. [name], MMR normal, peritoneal metastasis in 01/2021 -diagnosed 02/2020, due to perforation underwent emergent surgery. Path showed invasive adenocarcinoma with perforation, +3/15 LNs, and clear margins. Initial CT scan negative for distant metastasis. -due to bowel perforation and + LNs she has very high risk for recurrence, unfortunately she was not a candidate for adjuvant  chemo due to very slow recovery from surgery -Guardant Reveal ctDNA were all negative x3 (06/06/20, 07/12/20, and 09/10/20). -surveillance CT 08/27/20 showed a small density adjacent to the spleen that had decreased, no other evidence of recurrent or metastatic disease -first surveillance colonoscopy by Dr. Havery Moros 01/06/21 showed new adenocarcinoma at appendiceal orifice and other tubular adenomas throughout the colon.  Biopsy confirmed adenocarcinoma -01/27/21 PET showed tumor in appendiceal orifice is likely peritoneal metastasis growing into the cecum.  Her peritoneal implants are mainly in the pelvis, difficult to biopsy. -She began single agent Xeloda with cycle 1 on 02/01/21.  Due to low performance status with cycle 2 was changed to 1 week on/1 week off, and beva was added. She tolerates this well -PS improved and low dose oxaliplatin was added with C4, now on Capeox/beva q2 weeks, with Xeloda 1 week on/1 week off. -PET scan 05/08/2021 showed good response and peritoneal metastasis, with the development of several FDG avid pulmonary nodules, and hypermetabolic left paratracheal node, the differential includes infectious/inflammatory versus thoracic metastasis -She subsequently developed COVID-19 and respiratory infection which has resolved now and chemo restarted  -She tolerated last cycle CAPOX well.  Lab reviewed, adequate for treatment, will proceed oxaliplatin and bevacizumab today.  She started Xeloda this morning. -we will restage with CT CAP in a month   2. COVID-19+ 05/21/21 and respiratory infection -she received CAPOX/beva on 05/19/21, tested positive on 05/21/21 -treated with paxlovid and subsequently doxycycline, augmentin, and prednisone -chest x-ray 06/23/21 showed only emphysema -mostly resolved, only mild residual cough   2. H/o provoked RUE DVT 04/04/20 s/p 3 months AC (Eliquis)   3. Comorbidities: Asthma, COPD, anxiety -on nebulizer and trelegy, has not needed rescue inhaler  recently -f/up pulmonology and PCP   PLAN: -She is clinically doing well.  Lab reviewed, will proceed CAPOX and bevacizumab today  -  labs, f/u, and next cycle CAPOX on 07/21/21 -CT CAP in 3-4 weeks    No problem-specific Assessment & Plan notes found for this encounter.   Orders Placed This Encounter  Procedures   CT Abdomen Pelvis W Contrast    Standing Status:   Future    Standing Expiration Date:   07/07/2022    Order Specific Question:   If indicated for the ordered procedure, I authorize the administration of contrast media per Radiology protocol    Answer:   Yes    Order Specific Question:   Preferred imaging location?    Answer:   [facility]    Order Specific Question:   Release to patient    Answer:   Immediate    Order Specific Question:   Is Oral Contrast requested for this exam?    Answer:   No oral contrast    Order Specific Question:   Reason for No Oral Contrast    Answer:   Other    Order Specific Question:   Please answer why no oral contrast is requested    Answer:   severe diarrhea from oral cotnrast   All questions were answered. The patient knows to call the clinic with any problems, questions or concerns. No barriers to learning was detected. The total time spent in the appointment was 30 minutes.     [name], MD 07/07/2021   I, Wilburn Mylar, am acting as scribe for [name], MD.   I have reviewed the above documentation for accuracy and completeness, and I agree with the above.

## 2021-07-08 ENCOUNTER — Telehealth: Payer: Self-pay | Admitting: Hematology

## 2021-07-08 ENCOUNTER — Encounter: Payer: Self-pay | Admitting: Family Medicine

## 2021-07-08 NOTE — Telephone Encounter (Signed)
Scheduled follow-up appointments per 7/25 los. Patient is aware. 

## 2021-07-20 DIAGNOSIS — R1310 Dysphagia, unspecified: Secondary | ICD-10-CM | POA: Diagnosis not present

## 2021-07-20 DIAGNOSIS — J9600 Acute respiratory failure, unspecified whether with hypoxia or hypercapnia: Secondary | ICD-10-CM | POA: Diagnosis not present

## 2021-07-20 DIAGNOSIS — J449 Chronic obstructive pulmonary disease, unspecified: Secondary | ICD-10-CM | POA: Diagnosis not present

## 2021-07-20 DIAGNOSIS — E43 Unspecified severe protein-calorie malnutrition: Secondary | ICD-10-CM | POA: Diagnosis not present

## 2021-07-20 DIAGNOSIS — I82621 Acute embolism and thrombosis of deep veins of right upper extremity: Secondary | ICD-10-CM | POA: Diagnosis not present

## 2021-07-20 DIAGNOSIS — J90 Pleural effusion, not elsewhere classified: Secondary | ICD-10-CM | POA: Diagnosis not present

## 2021-07-20 DIAGNOSIS — C20 Malignant neoplasm of rectum: Secondary | ICD-10-CM | POA: Diagnosis not present

## 2021-07-20 DIAGNOSIS — M6281 Muscle weakness (generalized): Secondary | ICD-10-CM | POA: Diagnosis not present

## 2021-07-20 DIAGNOSIS — R69 Illness, unspecified: Secondary | ICD-10-CM | POA: Diagnosis not present

## 2021-07-21 ENCOUNTER — Inpatient Hospital Stay: Payer: Medicare HMO

## 2021-07-21 ENCOUNTER — Other Ambulatory Visit: Payer: Self-pay

## 2021-07-21 ENCOUNTER — Inpatient Hospital Stay: Payer: Medicare HMO | Attending: Hematology

## 2021-07-21 ENCOUNTER — Inpatient Hospital Stay: Payer: Medicare HMO | Admitting: Hematology

## 2021-07-21 VITALS — BP 147/81 | HR 97 | Temp 97.5°F | Resp 18 | Wt 123.1 lb

## 2021-07-21 DIAGNOSIS — R69 Illness, unspecified: Secondary | ICD-10-CM | POA: Diagnosis not present

## 2021-07-21 DIAGNOSIS — J449 Chronic obstructive pulmonary disease, unspecified: Secondary | ICD-10-CM | POA: Diagnosis not present

## 2021-07-21 DIAGNOSIS — C20 Malignant neoplasm of rectum: Secondary | ICD-10-CM

## 2021-07-21 DIAGNOSIS — Z86718 Personal history of other venous thrombosis and embolism: Secondary | ICD-10-CM | POA: Insufficient documentation

## 2021-07-21 DIAGNOSIS — Z79899 Other long term (current) drug therapy: Secondary | ICD-10-CM | POA: Insufficient documentation

## 2021-07-21 DIAGNOSIS — Z5111 Encounter for antineoplastic chemotherapy: Secondary | ICD-10-CM | POA: Insufficient documentation

## 2021-07-21 DIAGNOSIS — F419 Anxiety disorder, unspecified: Secondary | ICD-10-CM | POA: Insufficient documentation

## 2021-07-21 DIAGNOSIS — C181 Malignant neoplasm of appendix: Secondary | ICD-10-CM | POA: Insufficient documentation

## 2021-07-21 DIAGNOSIS — M8580 Other specified disorders of bone density and structure, unspecified site: Secondary | ICD-10-CM | POA: Insufficient documentation

## 2021-07-21 DIAGNOSIS — Z7901 Long term (current) use of anticoagulants: Secondary | ICD-10-CM | POA: Diagnosis not present

## 2021-07-21 DIAGNOSIS — Z5112 Encounter for antineoplastic immunotherapy: Secondary | ICD-10-CM | POA: Diagnosis not present

## 2021-07-21 DIAGNOSIS — Z933 Colostomy status: Secondary | ICD-10-CM | POA: Diagnosis not present

## 2021-07-21 DIAGNOSIS — I10 Essential (primary) hypertension: Secondary | ICD-10-CM | POA: Insufficient documentation

## 2021-07-21 DIAGNOSIS — C786 Secondary malignant neoplasm of retroperitoneum and peritoneum: Secondary | ICD-10-CM | POA: Insufficient documentation

## 2021-07-21 DIAGNOSIS — C799 Secondary malignant neoplasm of unspecified site: Secondary | ICD-10-CM

## 2021-07-21 LAB — CBC WITH DIFFERENTIAL (CANCER CENTER ONLY)
Abs Immature Granulocytes: 0.02 10*3/uL (ref 0.00–0.07)
Basophils Absolute: 0 10*3/uL (ref 0.0–0.1)
Basophils Relative: 0 %
Eosinophils Absolute: 0 10*3/uL (ref 0.0–0.5)
Eosinophils Relative: 0 %
HCT: 35.6 % — ABNORMAL LOW (ref 36.0–46.0)
Hemoglobin: 12.1 g/dL (ref 12.0–15.0)
Immature Granulocytes: 0 %
Lymphocytes Relative: 13 %
Lymphs Abs: 1.1 10*3/uL (ref 0.7–4.0)
MCH: 34.2 pg — ABNORMAL HIGH (ref 26.0–34.0)
MCHC: 34 g/dL (ref 30.0–36.0)
MCV: 100.6 fL — ABNORMAL HIGH (ref 80.0–100.0)
Monocytes Absolute: 0.8 10*3/uL (ref 0.1–1.0)
Monocytes Relative: 10 %
Neutro Abs: 6.4 10*3/uL (ref 1.7–7.7)
Neutrophils Relative %: 77 %
Platelet Count: 228 10*3/uL (ref 150–400)
RBC: 3.54 MIL/uL — ABNORMAL LOW (ref 3.87–5.11)
RDW: 15.8 % — ABNORMAL HIGH (ref 11.5–15.5)
WBC Count: 8.4 10*3/uL (ref 4.0–10.5)
nRBC: 0 % (ref 0.0–0.2)

## 2021-07-21 LAB — CMP (CANCER CENTER ONLY)
ALT: 18 U/L (ref 0–44)
AST: 27 U/L (ref 15–41)
Albumin: 3.6 g/dL (ref 3.5–5.0)
Alkaline Phosphatase: 118 U/L (ref 38–126)
Anion gap: 12 (ref 5–15)
BUN: 17 mg/dL (ref 8–23)
CO2: 22 mmol/L (ref 22–32)
Calcium: 9.3 mg/dL (ref 8.9–10.3)
Chloride: 108 mmol/L (ref 98–111)
Creatinine: 0.83 mg/dL (ref 0.44–1.00)
GFR, Estimated: >60 mL/min (ref >60)
Glucose, Bld: 94 mg/dL (ref 70–99)
Potassium: 3.9 mmol/L (ref 3.5–5.1)
Sodium: 142 mmol/L (ref 135–145)
Total Bilirubin: 0.5 mg/dL (ref 0.3–1.2)
Total Protein: 6.8 g/dL (ref 6.5–8.1)

## 2021-07-21 LAB — TOTAL PROTEIN, URINE DIPSTICK: Protein, ur: 30 mg/dL — AB

## 2021-07-21 LAB — CEA (IN HOUSE-CHCC): CEA (CHCC-In House): 3.43 ng/mL (ref 0.00–5.00)

## 2021-07-21 MED ORDER — OXALIPLATIN CHEMO INJECTION 100 MG/20ML
70.0000 mg/m2 | Freq: Once | INTRAVENOUS | Status: AC
  Administered 2021-07-21: 115 mg via INTRAVENOUS
  Filled 2021-07-21: qty 20

## 2021-07-21 MED ORDER — SODIUM CHLORIDE 0.9 % IV SOLN
Freq: Once | INTRAVENOUS | Status: AC
  Filled 2021-07-21: qty 250

## 2021-07-21 MED ORDER — SODIUM CHLORIDE 0.9% FLUSH
10.0000 mL | INTRAVENOUS | Status: DC | PRN
  Administered 2021-07-21: 10 mL
  Filled 2021-07-21: qty 10

## 2021-07-21 MED ORDER — DEXTROSE 5 % IV SOLN
Freq: Once | INTRAVENOUS | Status: AC
  Filled 2021-07-21: qty 250

## 2021-07-21 MED ORDER — SODIUM CHLORIDE 0.9% FLUSH
10.0000 mL | Freq: Once | INTRAVENOUS | Status: AC
  Administered 2021-07-21: 10 mL
  Filled 2021-07-21: qty 10

## 2021-07-21 MED ORDER — ALTEPLASE 2 MG IJ SOLR
INTRAMUSCULAR | Status: AC
  Filled 2021-07-21: qty 2

## 2021-07-21 MED ORDER — HEPARIN SOD (PORK) LOCK FLUSH 100 UNIT/ML IV SOLN
500.0000 [IU] | Freq: Once | INTRAVENOUS | Status: AC | PRN
  Administered 2021-07-21: 500 [IU]
  Filled 2021-07-21: qty 5

## 2021-07-21 MED ORDER — PALONOSETRON HCL INJECTION 0.25 MG/5ML
0.2500 mg | Freq: Once | INTRAVENOUS | Status: AC
  Administered 2021-07-21: 0.25 mg via INTRAVENOUS

## 2021-07-21 MED ORDER — SODIUM CHLORIDE 0.9 % IV SOLN
5.0000 mg/kg | Freq: Once | INTRAVENOUS | Status: AC
  Administered 2021-07-21: 300 mg via INTRAVENOUS
  Filled 2021-07-21: qty 12

## 2021-07-21 MED ORDER — PALONOSETRON HCL INJECTION 0.25 MG/5ML
INTRAVENOUS | Status: AC
  Filled 2021-07-21: qty 5

## 2021-07-21 MED ORDER — SODIUM CHLORIDE 0.9 % IV SOLN
10.0000 mg | Freq: Once | INTRAVENOUS | Status: AC
  Administered 2021-07-21: 10 mg via INTRAVENOUS
  Filled 2021-07-21: qty 10

## 2021-07-21 MED ORDER — ALTEPLASE 2 MG IJ SOLR
2.0000 mg | Freq: Once | INTRAMUSCULAR | Status: AC | PRN
  Administered 2021-07-21: 2 mg
  Filled 2021-07-21: qty 2

## 2021-07-21 NOTE — Patient Instructions (Addendum)
[facility]  Discharge Instructions: Thank you for choosing [facility] to provide your oncology and hematology care.   If you have a lab appointment with the [facility], please go directly to the [facility] and check in at the registration area.   Wear comfortable clothing and clothing appropriate for easy access to any Portacath or PICC line.   We strive to give you quality time with your provider. You may need to reschedule your appointment if you arrive late (15 or more minutes).  Arriving late affects you and other patients whose appointments are after yours.  Also, if you miss three or more appointments without notifying the office, you may be dismissed from the clinic at the provider's discretion.      For prescription refill requests, have your pharmacy contact our office and allow 72 hours for refills to be completed.    Today you received the following chemotherapy and/or immunotherapy agents: Zirabev, Oxaliplatin    To help prevent nausea and vomiting after your treatment, we encourage you to take your nausea medication as directed.  BELOW ARE SYMPTOMS THAT SHOULD BE REPORTED IMMEDIATELY: *FEVER GREATER THAN 100.4 F (38 C) OR HIGHER *CHILLS OR SWEATING *NAUSEA AND VOMITING THAT IS NOT CONTROLLED WITH YOUR NAUSEA MEDICATION *UNUSUAL SHORTNESS OF BREATH *UNUSUAL BRUISING OR BLEEDING *URINARY PROBLEMS (pain or burning when urinating, or frequent urination) *BOWEL PROBLEMS (unusual diarrhea, constipation, pain near the anus) TENDERNESS IN MOUTH AND THROAT WITH OR WITHOUT PRESENCE OF ULCERS (sore throat, sores in mouth, or a toothache) UNUSUAL RASH, SWELLING OR PAIN  UNUSUAL VAGINAL DISCHARGE OR ITCHING   Items with * indicate a potential emergency and should be followed up as soon as possible or go to the Emergency Department if any problems should occur.  Please show the CHEMOTHERAPY ALERT CARD or IMMUNOTHERAPY ALERT CARD at  check-in to the Emergency Department and triage nurse.  Should you have questions after your visit or need to cancel or reschedule your appointment, please contact [facility]  Dept: [phone]  and follow the prompts.  Office hours are 8:00 a.m. to 4:30 p.m. Monday - Friday. Please note that voicemails left after 4:00 p.m. may not be returned until the following business day.  We are closed weekends and major holidays. You have access to a nurse at all times for urgent questions. Please call the main number to the clinic Dept: [phone] and follow the prompts.   For any non-urgent questions, you may also contact your provider using MyChart. We now offer e-Visits for anyone 68 and older to request care online for non-urgent symptoms. For details visit [email or web address].   Also download the MyChart app! Go to the app store, search "MyChart", open the app, select [facility], and log in with your MyChart username and password.  Due to Covid, a mask is required upon entering the hospital/clinic. If you do not have a mask, one will be given to you upon arrival. For doctor visits, patients may have 1 support person aged 7 or older with them. For treatment visits, patients cannot have anyone with them due to current Covid guidelines and our immunocompromised population.

## 2021-07-21 NOTE — Progress Notes (Addendum)
[place]   Telephone:(336) [phone] Fax:(336) [phone]   Clinic Follow up Note   Patient Care Team: [name], DO as PCP - General (Family Medicine) [name], MD as Consulting Physician (Hematology) Icard, Octavio Graves, DO as Consulting Physician (Pulmonary Disease) [place], P.A. [name], MD as Consulting Physician (General Surgery) [name], MD as Consulting Physician (General Surgery) [name], [name], MD as Consulting Physician (Gastroenterology)  Date of Service:  07/21/2021  CHIEF COMPLAINT: f/u of rectal cancer  SUMMARY OF ONCOLOGIC HISTORY: Oncology History Overview Note  Cancer Staging Rectal cancer [facility]) Staging form: Colon and Rectum, AJCC 8th Edition - Pathologic stage from 03/03/2020: Stage IIIB (pT4a, pN1b, cM0) - Signed by [name], MD on 01/28/2021 Stage prefix: Initial diagnosis Histologic grading system: 4 grade system Histologic grade (G): G2 Residual tumor (R): R0 - None    Rectal cancer ([place])  03/03/2020 - 04/05/2020 Hospital Admission   She was admitted to ED on 03/03/20 for abdominal pain and nausea. She had been having diarrhea for several weeks. During hospital stay she developed acute respiratory failure, AKI, RUE DVT from PICC line. Work up showed bowel perforation, small left liver mass and thickening of jejunum. She underwent emergent surgery on 03/03/20 for resection and colostomy placement. Her path showed invasive cancer, metastatic to 3/15 LNs. She had a NGT in placed but this was removed on POD 3. Post op her stoma became necrotic and septic. She had another bowel surgery on 03/12/20, which was NED with necrotic tissue.    03/03/2020 Imaging   CT AP 03/03/20  IMPRESSION: 1. Free intraperitoneal air consistent perforation. Most likely site of perforation is in the distal splenic flexure/proximal descending colon where there is a collection of air and gas measuring 6 centimeters. No obvious soft  tissue mass identified in this region. At this site, there is abrupt transition of dilated, stool-filled colon to completely decompressed proximal descending colon. 2. Thickened, inflamed loops of jejunum are identified within the pelvis and are likely reactive. 3. Small hiatal hernia. 4. Benign-appearing 1.6 centimeter mass the LEFT hepatic lobe. Recommend comparison with prior studies if available. 5.  Emphysema (ICD10-J43.9). 6.  Aortic Atherosclerosis (ICD10-I70.0). 7. Bilateral renal scarring.   03/03/2020 Surgery   low anterior resection end colostomy by Dr Marcello Moores    03/03/2020 Initial Biopsy   FINAL MICROSCOPIC DIAGNOSIS: 03/03/20 A. RECTOSIGMOID COLON, LOW ANTERIOR RESECTION:  - Invasive colonic adenocarcinoma, 3.5 cm.  - Tumor invades the visceral peritoneum.  - Margins of resection are not involved.  - Metastatic carcinoma in (3) of (15) lymph nodes.  - See oncology table.   B. ADDITIONAL SIGMOID COLON, RESECTION:  - Colonic tissue, negative for carcinoma.  ADDENDUM:  Mismatch Repair Protein (IHC)   SUMMARY INTERPRETATION: NORMAL  There is preserved expression of the major MMR proteins. There is a very  low probability that microsatellite instability (MSI) is present.  However, certain clinically significant MMR protein mutations may result  in preservation of nuclear expression. It is recommended that the  preservation of protein expression be correlated with molecular based  MSI testing.   IHC EXPRESSION RESULTS  TEST           RESULT  MLH1:          Preserved nuclear expression  MSH2:          Preserved nuclear expression  MSH6:          Preserved nuclear expression  PMS2:  Preserved nuclear expression   03/03/2020 Cancer Staging   Staging form: Colon and Rectum, AJCC 8th Edition - Pathologic stage from 03/03/2020: Stage IIIB (pT4a, pN1b, cM0) - Signed by [name], MD on 01/28/2021  Stage prefix: Initial diagnosis  Histologic grading system: 4 grade  system  Histologic grade (G): G2  Residual tumor (R): R0 - None    03/08/2020 Imaging   CT AP 03/08/20 IMPRESSION: 1. Interval midline laparotomy with distal colon resection and diverting left lower quadrant colostomy. 2. Diffuse small bowel dilatation with gas fluid levels most consistent with postoperative ileus. 3. Trace ascites within the abdomen and pelvis. No fluid collection or abscess at this time. Surgical drain within the lower pelvis. 4. Indeterminate 1.4 cm subcapsular liver hypodensity. In light of newly diagnosed rectal cancer, metastatic disease cannot be excluded. PET CT may be useful for further evaluation. 5. Interval development of trace bilateral pleural effusions and diffuse body wall edema.   03/12/2020 Surgery   EXPLORATORY LAPAROTOMY WITH BOWEL RESECTION AND COLOSTOMY by Dr Hassell Done 03/12/20  FINAL MICROSCOPIC DIAGNOSIS: 03/12/20  A. COLON, SPLENIC FLEXURE, RESECTION:  - Segment of colon (37 cm) with perforation and associated inflammation  - Multiple mucosal ulcers with necrotizing inflammation  - No evidence of malignancy    03/12/2020 Imaging   CT AP WO contrast 03/12/20  IMPRESSION: 1. Exam is limited by lack of intravenous contrast, motion, and artifact from the patient's arms adjacent to the torso. Since 03/08/2020, there has been development of relatively large pockets of intraperitoneal free air. While intraperitoneal gas would not be unexpected on postoperative day 9, this gas is new since an intervening study of 03/08/2020 and given the relatively large volume certainly raises concern for bowel perforation. No source for the intraperitoneal free air is evident on this study. There is a surgical drain in the pelvis, but there is no free gas around the drain itself to suggest that it represents the source. 2. New circumferential wall thickening in the splenic flexure, descending colon and sigmoid colon leading into the end  colostomy. Infection/inflammation would be a consideration. Ischemia cannot be excluded. 3. Relatively small volume intraperitoneal free fluid. High attenuation small fluid collections in the left upper abdomen may reflect hemorrhage, infection or residua from prior perforation. 4. Interval progression of diffuse body wall edema. 5. Residual contrast material in the renal parenchyma from prior imaging, compatible with renal dysfunction.    03/19/2020 Imaging   CT CAP 03/19/20  IMPRESSION: 1. 5.5 x 3.2 cm fluid collection is noted along the greater curvature of the proximal stomach. 2. Surgical drain is again noted in the pelvis with tip in left lower quadrant. 3. Interval development of crescent-shaped fluid collection measuring 16.4 x 3.3 cm in the epigastric region and left upper quadrant of the abdomen which may extend into the left lower quadrant. Potentially this may represent abscess or developing abscess. Multiple other smaller fluid collections are noted which may represent small abscesses. 4. 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 5. Mild amount of free fluid is noted in the posterior pelvis. 6. Moderate anasarca is noted. Aortic Atherosclerosis (ICD10-I70.0).   04/12/2020 Imaging   CT AP 04/12/20  IMPRESSION: 1. Fluid collections within the LEFT abdomen have significantly decreased compared to previous CT exams, now nearly completely resolved. 2. Percutaneous drainage catheter with tip coiled posterior to the LEFT kidney, stable positioning compared to the previous study. The more anterior catheter has been removed. 3. Small amount of free fluid persists within  the abdomen and pelvis. 4. Trace bibasilar pleural effusions. 5. Anasarca. 6. While reviewing today's study, comparing with a chest/abdomen/pelvis CT from earlier same month, there is question of thrombus in the LEFT internal jugular vein. Recommend ultrasound of the LEFT IJ to exclude DVT. This  recommendation discussed with patient's hospitalist, Dr. Owens Shark, on 04/12/2020 at 4:20 p.m.   Emphysema (ICD10-J43.9).   04/22/2020 Imaging   CT AP 04/22/20  IMPRESSION: 1. No recurrent intra-abdominal abscess status post interval removal of left retroperitoneal percutaneous drain. Stable small amount of free pelvic fluid. 2. Enlarging dependent bilateral pleural effusions, now moderate in volume. Associated atelectasis at both lung bases. 3. Progressive anasarca with generalized edema throughout the subcutaneous fat. 4. Stable small subcapsular fluid collection along the anterior aspect of the left hepatic lobe. 5. Aortic Atherosclerosis (ICD10-I70.0).   05/23/2020 Initial Diagnosis   Rectal cancer ([place])   01/14/2021 Imaging   CT CAP IMPRESSION: -5.0 cm soft tissue mass along the posterior aspect of the cecum at the base of the appendix, favored to reflect a peritoneal/serosal implant, corresponding to the patient's known adenocarcinoma. This is new from the prior. -Suspected additional pelvic implants along the uterus and right adnexa, poorly visualized, new/progressive from the prior. -Additional pericapsular lesion along the lateral spleen is mildly progressive, suspicious for additional peritoneal implant. -No evidence of metastatic disease in the chest.   01/27/2021 PET scan   IMPRESSION:  1. Extensive hypermetabolic peritoneal metastasis, as detailed  above.  2. No evidence of hypermetabolic supradiaphragmatic disease.  3. Diffuse low-level thyroid hypermetabolism can be seen in the  setting of thyroiditis. Consider correlation with thyroid function  labs.  4. Aortic atherosclerosis (ICD10-I70.0) and emphysema (ICD10-J43.9).    02/01/2021 -  Chemotherapy    First-line chemo Xeloda 1500 mg twice daily for day 1-14, every 21 days, started on 02/01/2021. Reduced to 1 week on/ 1 week off and added bevacizumab q2weeks from C2 on 02/24/21.  ----Given good tolerance, I changed to  CAPOX and bevacizumab q2weeks from C4 on 03/24/21 with Xeloda 1521m in the AM and 10079min the PM 1 week on/1 week off.     05/08/2021 Imaging   PET  IMPRESSION: 1. There is been interval development of several FDG avid pulmonary nodules within both lungs. Additionally, there is new FDG avid low left paratracheal lymph node. Thoracic metastasis cannot be excluded. 2. Interval improvement in multifocal FDG avid peritoneal metastasis as detailed above.     Malignant neoplasm of [place] ([place] 01/06/2021 Procedure   (surveillance colonoscopy for h/o rectal cancer) Impression:  - Preparation of the colon was fair, lavage performed with mostly adequate views. - Polypoid lesion obliterating appendiceal orifice. Biopsied to evaluate for malignancy. - The examined portion of the ileum was normal. - Two large polyps in the cecum, not removed today pending path at appendiceal orifice, bowel prep. If removed endoscopically in the future would favor EMR to be done at the hospital. - One 5 mm polyp at the ileocecal valve, removed with a cold snare. Resected and retrieved. - One 3 mm polyp in the transverse colon, removed with a cold snare. Resected and retrieved. - Parastomal hernia making initial entry to the distal colon challenging. - The examination was otherwise normal.   01/06/2021 Initial Biopsy   Diagnosis 1. Colon, biopsy, appendiceal oraface - ADENOCARCINOMA. 2. Colon, polyp(s), ileocecal valve, transverse, x2 - TUBULAR ADENOMA, NEGATIVE FOR HIGH GRADE DYSPLASIA (X1). - COLONIC MUCOSA WITH UNDERLYING LYMPHOID AGGREGATE, NEGATIVE FOR DYSPLASIA (X1).  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 This is new from  the prior. -Suspected additional pelvic implants along the uterus and right adnexa, poorly visualized, new/progressive from the prior. -Additional pericapsular lesion along the lateral spleen is mildly progressive, suspicious for additional peritoneal implant. -No evidence of metastatic disease in the chest.      CURRENT THERAPY:  First-line chemo Xeloda 1500 mg twice daily for day 1-14, every 21 days, started on 02/01/2021. Reduced to 1 week on/ 1 week off and added bevacizumab q2weeks from C2 on 02/24/21. Given good tolerance, changed to CAPOX and bevacizumab q2weeks from C4 on 03/24/21 with Xeloda 1547m in the AM and 10080min the PM 1 week on/1 week off.  INTERVAL HISTORY:  DoELLIONNA BUCKBEEs here for a follow up of rectal cancer. She was last seen by me on 07/07/21. She presents to the clinic alone. She reports she tolerated her last chemo cycle well. She notes cold sensitivity that diminishes over 4-5 days. She denies any numbness or tingling. She also denies any issues with Xeloda; she notes she began her next cycle today. She denies fever, chills, or pain.   All other systems were reviewed with the patient and are negative.  MEDICAL HISTORY:  Past Medical History:  Diagnosis Date   Acute deep vein thrombosis (DVT) of brachial vein of right upper extremity (HCC)    Acute on chronic respiratory failure with hypoxia (HCC)    Asthma    Cataract    CHICKENPOX, HX OF 01/06/2011   Qualifier: Diagnosis of  By: BlCharlett BlakeD, Stacey     COPD (chronic obstructive pulmonary disease) ([facility]2011   FeV1 31% predicted FeV1/FVX 47 %   COVID-19 05/2021   Essential hypertension 05/23/2020   Hemorrhoid    Open right radial fracture 2020   Osteopenia    Perforated sigmoid colon ([facility]03/21/2021   Pleural effusion    Pneumonia 2021   Postoperative intra-abdominal abscess 2021   Rectal cancer (HCC)    Seasonal allergies    takes Claritin daily prn   [place] circulatory [place] [place]   Tracheostomy  status ([facility]   Vitamin D deficiency    takes Vit d every 14 days    SURGICAL HISTORY: Past Surgical History:  Procedure Laterality Date   AUGMENTATION MAMMAPLASTY     saline   EXAMINATION UNDER ANESTHESIA  10/14/2012   Procedure: EXAM UNDER ANESTHESIA;  Surgeon: ErGayland CurryMD,FACS;  [facility] Service: General;  Laterality: N/A;  rectal exam under anesthesia excisional hemorrhoidectomy hemorrhoidal banding x two   EXAMINATION UNDER ANESTHESIA  02/03/2013   excision hemorrhoidal tissue   FOOT SURGERY     left bunionectomy   HEMORRHOIDECTOMY WITH HEMORRHOID BANDING  10/14/2012   Procedure: HEMORRHOIDECTOMY WITH HEMORRHOID BANDING;  Surgeon: ErGayland CurryMD,FACS;  [facility]: [facility];  Laterality: N/A;   IR IMAGING GUIDED PORT INSERTION  02/03/2021   IR THORACENTESIS ASP PLEURAL SPACE W/IMG GUIDE  04/23/2020   LAPAROTOMY N/A 03/03/2020   Procedure: low anterior resection end colostomy;  Surgeon: ThLeighton RuffMD;  Location: [facility];  Service: General;  Laterality: N/A;   LAPAROTOMY N/A 03/12/2020   Procedure: EXPLORATORY LAPAROTOMY WITH BOWEL RESECTION  AND COLOSTOMY;  Surgeon: [name], MD;  Location: [facility];  Service: General;  Laterality: N/A;   OPEN REDUCTION INTERNAL FIXATION (ORIF) DISTAL RADIAL FRACTURE Right 11/01/2018   Procedure: OPEN REDUCTION INTERNAL FIXATION (ORIF) DISTAL RADIAL FRACTURE;  Surgeon: [name], MD;  Location: [facility];  Service: Orthopedics;  Laterality: Right;   TONSILLECTOMY  1960   recurrent otitis media   US ECHOCARDIOGRAPHY  02/2020    poor windows, normal LV function, severely dilated RV with moderately reduced function, RV volume and pressure overload, mildly dilated RA    I have reviewed the social history and family history with the patient and they are unchanged from previous note.  ALLERGIES:  is allergic to amlodipine and codeine.  MEDICATIONS:  Current Outpatient Medications  Medication Sig  Dispense Refill   albuterol (PROVENTIL) (2.5 MG/3ML) 0.083% nebulizer solution Take 3 mLs (2.5 mg total) by nebulization every 4 (four) hours as needed for wheezing or shortness of breath. DX: J44.9 360 mL 5   albuterol (VENTOLIN HFA) 108 (90 Base) MCG/ACT inhaler Inhale 2 puffs into the lungs every 6 (six) hours as needed for wheezing or shortness of breath. 8 g 5   ALPRAZolam (XANAX) 0.25 MG tablet Take 1 tablet (0.25 mg total) by mouth at bedtime as needed for anxiety. 30 tablet 0   capecitabine (XELODA) 500 MG tablet Take 3 tablets (1,500 mg total) by mouth 2 (two) times daily after a meal. Take for 7 days on, 7 days off, repeat every 14 days. 84 tablet 1   diphenhydrAMINE (BENADRYL) 25 mg capsule Take 25 mg by mouth every 6 (six) hours as needed.     docusate sodium (COLACE) 50 MG capsule Take 50 mg by mouth daily as needed for mild constipation.     fluticasone (FLONASE) 50 MCG/ACT nasal spray Place 2 sprays into both nostrils daily. 16 mL 5   Fluticasone-Umeclidin-Vilant (TRELEGY ELLIPTA) 100-62.5-25 MCG/INH AEPB Inhale 1 puff into the lungs daily. 60 each 2   HYDROcodone bit-homatropine (HYDROMET) 5-1.5 MG/5ML syrup Take 5 mLs by mouth every 6 (six) hours as needed for cough. 120 mL 0   lidocaine-prilocaine (EMLA) cream Apply 1 application topically as needed. 30 g 0   ondansetron (ZOFRAN) 8 MG tablet Take 1 tablet (8 mg total) by mouth every 8 (eight) hours as needed for nausea or vomiting. 20 tablet 2   traMADol (ULTRAM) 50 MG tablet Take 1 tablet (50 mg total) by mouth every 6 (six) hours as needed. 30 tablet 0   No current facility-administered medications for this visit.    PHYSICAL EXAMINATION: ECOG PERFORMANCE STATUS: 1 - Symptomatic but completely ambulatory  There were no vitals filed for this visit. Wt Readings from Last 3 Encounters:  07/07/21 123 lb 14.4 oz (56.2 kg)  06/23/21 123 lb 9.6 oz (56.1 kg)  05/19/21 127 lb 8 oz (57.8 kg)     GENERAL:alert, no distress and  comfortable SKIN: skin color normal, no rashes or significant lesions EYES: normal, Conjunctiva are pink and non-injected, sclera clear  NEURO: alert & oriented x 3 with fluent speech  LABORATORY DATA:  I have reviewed the data as listed CBC Latest Ref Rng & Units 07/07/2021 06/23/2021 05/19/2021  WBC 4.0 - 10.5 K/uL 8.2 8.7 9.7  Hemoglobin 12.0 - 15.0 g/dL 11.2(L) 12.4 12.6  Hematocrit 36.0 - 46.0 % 33.8(L) 36.8 38.8  Platelets 150 - 400 K/uL 279 288 286     CMP Latest Ref Rng & Units 07/07/2021 06/23/2021  05/19/2021  Glucose 70 - 99 mg/dL 98 90 90  BUN 8 - 23 mg/dL 17 15 17   Creatinine 0.44 - 1.00 mg/dL 0.79 0.83 1.04(H)  Sodium 135 - 145 mmol/L 138 140 142  Potassium 3.5 - 5.1 mmol/L 3.9 4.5 4.1  Chloride 98 - 111 mmol/L 105 106 107  CO2 22 - 32 mmol/L 25 25 23   Calcium 8.9 - 10.3 mg/dL 9.2 9.9 9.7  Total Protein 6.5 - 8.1 g/dL 6.5 7.0 7.2  Total Bilirubin 0.3 - 1.2 mg/dL 0.4 0.3 0.3  Alkaline Phos 38 - 126 U/L 105 105 99  AST 15 - 41 U/L 23 22 19   ALT 0 - 44 U/L 15 10 11       RADIOGRAPHIC STUDIES: I have personally reviewed the radiological images as listed and agreed with the findings in the report. No results found.   ASSESSMENT & PLAN:  Tracy [name] is a 70 y.o. female with   1. [name], MMR normal, peritoneal metastasis in 01/2021 -diagnosed 02/2020, due to perforation underwent emergent surgery. Path showed invasive adenocarcinoma with perforation, +3/15 LNs, and clear margins. Initial CT scan negative for distant metastasis. -due to bowel perforation and + LNs she has very high risk for recurrence, unfortunately she was not a candidate for adjuvant chemo due to very slow recovery from surgery -Guardant Reveal ctDNA were all negative x3 (06/06/20, 07/12/20, and 09/10/20). -surveillance CT 08/27/20 showed a small density adjacent to the spleen that had decreased, no other evidence of recurrent or metastatic disease -first  surveillance colonoscopy by Dr. Havery Moros 01/06/21 showed new adenocarcinoma at appendiceal orifice and other tubular adenomas throughout the colon.  Biopsy confirmed adenocarcinoma -01/27/21 PET showed tumor in appendiceal orifice is likely peritoneal metastasis growing into the cecum.  Her peritoneal implants are mainly in the pelvis, difficult to biopsy. -She began single agent Xeloda with cycle 1 on 02/01/21.  Due to low performance status with cycle 2 was changed to 1 week on/1 week off, and beva was added. She tolerates this well -PS improved and low dose oxaliplatin was added with C4, now on CAPOX/beva q2 weeks, with Xeloda 1 week on/1 week off. -PET scan 05/08/21 showed good response to peritoneal metastasis, with the development of several FDG avid pulmonary nodules, and hypermetabolic left paratracheal node, the differential includes infectious/inflammatory versus thoracic metastasis. Given her COPD and good response in abdomen, I think this is likely inflammatory  -Chemo held briefly due to COVID-19 and respiratory infection -She tolerated last cycle CAPOX well.  Lab reviewed, adequate for treatment, will proceed oxaliplatin and bevacizumab today.  She started Xeloda this morning. -we will restage with CT CAP in early 08/2021   2. COVID-19+ 05/21/21 and respiratory infection -she received CAPOX/beva on 05/19/21, tested positive on 05/21/21 -treated with paxlovid and subsequently doxycycline, augmentin, and prednisone -chest x-ray 06/23/21 showed only emphysema -resolved   2. H/o provoked RUE DVT 04/04/20 s/p 3 months AC (Eliquis)   3. Comorbidities: Asthma, COPD, anxiety -on nebulizer and trelegy, has not needed rescue inhaler recently -f/u with pulmonology and PCP     PLAN: -proceed with C8 CAPOX/bevacizumab today -labs, f/u, and next cycle CAPOX on 08/04/21 -plan for CT CAP prior to Labor day weekend    No problem-specific Assessment & Plan notes found for this encounter.   Orders  Placed This Encounter  Procedures   CT CHEST ABDOMEN PELVIS W CONTRAST    Standing Status:   Future    Standing  Expiration Date:   07/21/2022    Order Specific Question:   If indicated for the ordered procedure, I authorize the administration of contrast media per Radiology protocol    Answer:   Yes    Order Specific Question:   Preferred imaging location?    Answer:   [facility]    Order Specific Question:   Release to patient    Answer:   Immediate    Order Specific Question:   Is Oral Contrast requested for this exam?    Answer:   No oral contrast    Order Specific Question:   Reason for No Oral Contrast    Answer:   Other    Order Specific Question:   Please answer why no oral contrast is requested    Answer:   poor tolerance to oral contrast    Order Specific Question:   Reason for Exam (SYMPTOM  OR DIAGNOSIS REQUIRED)    Answer:   evaluate chemo response   All questions were answered. The patient knows to call the clinic with any problems, questions or concerns. No barriers to learning was detected. The total time spent in the appointment was 30 minutes.     [name], MD 07/21/2021   I, Wilburn Mylar, am acting as scribe for [name], MD.   I have reviewed the above documentation for accuracy and completeness, and I agree with the above.

## 2021-07-26 ENCOUNTER — Encounter: Payer: Self-pay | Admitting: Hematology

## 2021-07-29 ENCOUNTER — Telehealth: Payer: Self-pay

## 2021-07-29 NOTE — Telephone Encounter (Signed)
This nurse received call from patient stating that she scheduled her CT scan and spoke with them about using her port.  She was told that she needed to reach out to [name] to have the port accessed.  This nurse advised patient that we can schedule her to have the port accessed prior to going to appointment.  Added a port flush appointment at 915 am on 08/14/21 for CT.  Patient is in agreement.  No further questions or concerns at this time.

## 2021-08-01 MED FILL — Dexamethasone Sodium Phosphate Inj 100 MG/10ML: INTRAMUSCULAR | Qty: 1 | Status: AC

## 2021-08-04 ENCOUNTER — Inpatient Hospital Stay: Payer: Medicare HMO

## 2021-08-04 ENCOUNTER — Encounter: Payer: Self-pay | Admitting: Hematology

## 2021-08-04 ENCOUNTER — Other Ambulatory Visit: Payer: Self-pay

## 2021-08-04 ENCOUNTER — Inpatient Hospital Stay: Payer: Medicare HMO | Admitting: Hematology

## 2021-08-04 VITALS — BP 138/82 | HR 94 | Temp 97.6°F | Resp 21 | Ht 62.0 in | Wt 123.3 lb

## 2021-08-04 DIAGNOSIS — C20 Malignant neoplasm of rectum: Secondary | ICD-10-CM

## 2021-08-04 DIAGNOSIS — Z7901 Long term (current) use of anticoagulants: Secondary | ICD-10-CM | POA: Diagnosis not present

## 2021-08-04 DIAGNOSIS — Z86718 Personal history of other venous thrombosis and embolism: Secondary | ICD-10-CM | POA: Diagnosis not present

## 2021-08-04 DIAGNOSIS — C181 Malignant neoplasm of appendix: Secondary | ICD-10-CM

## 2021-08-04 DIAGNOSIS — C786 Secondary malignant neoplasm of retroperitoneum and peritoneum: Secondary | ICD-10-CM | POA: Diagnosis not present

## 2021-08-04 DIAGNOSIS — R69 Illness, unspecified: Secondary | ICD-10-CM | POA: Diagnosis not present

## 2021-08-04 DIAGNOSIS — C799 Secondary malignant neoplasm of unspecified site: Secondary | ICD-10-CM

## 2021-08-04 DIAGNOSIS — Z5111 Encounter for antineoplastic chemotherapy: Secondary | ICD-10-CM | POA: Diagnosis not present

## 2021-08-04 DIAGNOSIS — M8580 Other specified disorders of bone density and structure, unspecified site: Secondary | ICD-10-CM | POA: Diagnosis not present

## 2021-08-04 DIAGNOSIS — I10 Essential (primary) hypertension: Secondary | ICD-10-CM | POA: Diagnosis not present

## 2021-08-04 DIAGNOSIS — Z79899 Other long term (current) drug therapy: Secondary | ICD-10-CM | POA: Diagnosis not present

## 2021-08-04 DIAGNOSIS — Z5112 Encounter for antineoplastic immunotherapy: Secondary | ICD-10-CM | POA: Diagnosis not present

## 2021-08-04 LAB — CMP (CANCER CENTER ONLY)
ALT: 13 U/L (ref 0–44)
AST: 18 U/L (ref 15–41)
Albumin: 3.6 g/dL (ref 3.5–5.0)
Alkaline Phosphatase: 109 U/L (ref 38–126)
Anion gap: 12 (ref 5–15)
BUN: 13 mg/dL (ref 8–23)
CO2: 24 mmol/L (ref 22–32)
Calcium: 9.3 mg/dL (ref 8.9–10.3)
Chloride: 103 mmol/L (ref 98–111)
Creatinine: 1.01 mg/dL — ABNORMAL HIGH (ref 0.44–1.00)
GFR, Estimated: >60 mL/min (ref >60)
Glucose, Bld: 97 mg/dL (ref 70–99)
Potassium: 4.1 mmol/L (ref 3.5–5.1)
Sodium: 139 mmol/L (ref 135–145)
Total Bilirubin: 0.5 mg/dL (ref 0.3–1.2)
Total Protein: 7.1 g/dL (ref 6.5–8.1)

## 2021-08-04 LAB — CBC WITH DIFFERENTIAL (CANCER CENTER ONLY)
Abs Immature Granulocytes: 0.02 10*3/uL (ref 0.00–0.07)
Basophils Absolute: 0.1 10*3/uL (ref 0.0–0.1)
Basophils Relative: 1 %
Eosinophils Absolute: 0.1 10*3/uL (ref 0.0–0.5)
Eosinophils Relative: 1 %
HCT: 38.7 % (ref 36.0–46.0)
Hemoglobin: 12.8 g/dL (ref 12.0–15.0)
Immature Granulocytes: 0 %
Lymphocytes Relative: 32 %
Lymphs Abs: 2.8 10*3/uL (ref 0.7–4.0)
MCH: 33.8 pg (ref 26.0–34.0)
MCHC: 33.1 g/dL (ref 30.0–36.0)
MCV: 102.1 fL — ABNORMAL HIGH (ref 80.0–100.0)
Monocytes Absolute: 1.2 10*3/uL — ABNORMAL HIGH (ref 0.1–1.0)
Monocytes Relative: 13 %
Neutro Abs: 4.7 10*3/uL (ref 1.7–7.7)
Neutrophils Relative %: 53 %
Platelet Count: 261 10*3/uL (ref 150–400)
RBC: 3.79 MIL/uL — ABNORMAL LOW (ref 3.87–5.11)
RDW: 15.9 % — ABNORMAL HIGH (ref 11.5–15.5)
WBC Count: 8.7 10*3/uL (ref 4.0–10.5)
nRBC: 0 % (ref 0.0–0.2)

## 2021-08-04 LAB — TOTAL PROTEIN, URINE DIPSTICK: Protein, ur: 30 mg/dL — AB

## 2021-08-04 MED ORDER — DEXTROSE 5 % IV SOLN: Freq: Once | INTRAVENOUS | Status: AC

## 2021-08-04 MED ORDER — SODIUM CHLORIDE 0.9% FLUSH
10.0000 mL | Freq: Once | INTRAVENOUS | Status: AC
  Administered 2021-08-04: 10 mL

## 2021-08-04 MED ORDER — HEPARIN SOD (PORK) LOCK FLUSH 100 UNIT/ML IV SOLN
500.0000 [IU] | Freq: Once | INTRAVENOUS | Status: AC | PRN
  Administered 2021-08-04: 500 [IU]

## 2021-08-04 MED ORDER — OXALIPLATIN CHEMO INJECTION 100 MG/20ML
70.0000 mg/m2 | Freq: Once | INTRAVENOUS | Status: AC
  Administered 2021-08-04: 115 mg via INTRAVENOUS
  Filled 2021-08-04: qty 20

## 2021-08-04 MED ORDER — CAPECITABINE 500 MG PO TABS: 1000.0000 mg/m2 | ORAL_TABLET | Freq: Two times a day (BID) | ORAL | 1 refills | Status: DC

## 2021-08-04 MED ORDER — PALONOSETRON HCL INJECTION 0.25 MG/5ML
0.2500 mg | Freq: Once | INTRAVENOUS | Status: AC
  Administered 2021-08-04: 0.25 mg via INTRAVENOUS
  Filled 2021-08-04: qty 5

## 2021-08-04 MED ORDER — BEVACIZUMAB-BVZR CHEMO INJECTION 400 MG/16ML
5.0000 mg/kg | Freq: Once | INTRAVENOUS | Status: AC
  Administered 2021-08-04: 300 mg via INTRAVENOUS
  Filled 2021-08-04: qty 12

## 2021-08-04 MED ORDER — ALTEPLASE 2 MG IJ SOLR
2.0000 mg | Freq: Once | INTRAMUSCULAR | Status: AC
  Administered 2021-08-04: 2 mg
  Filled 2021-08-04: qty 2

## 2021-08-04 MED ORDER — SODIUM CHLORIDE 0.9 % IV SOLN: Freq: Once | INTRAVENOUS | Status: AC

## 2021-08-04 MED ORDER — DEXAMETHASONE SODIUM PHOSPHATE 100 MG/10ML IJ SOLN
10.0000 mg | Freq: Once | INTRAMUSCULAR | Status: AC
  Administered 2021-08-04: 10 mg via INTRAVENOUS
  Filled 2021-08-04: qty 10

## 2021-08-04 MED ORDER — SODIUM CHLORIDE 0.9% FLUSH
10.0000 mL | INTRAVENOUS | Status: DC | PRN
  Administered 2021-08-04: 10 mL

## 2021-08-04 NOTE — Patient Instructions (Signed)
[facility]  Discharge Instructions: Thank you for choosing [facility] to provide your oncology and hematology care.   If you have a lab appointment with the [facility], please go directly to the [facility] and check in at the registration area.   Wear comfortable clothing and clothing appropriate for easy access to any Portacath or PICC line.   We strive to give you quality time with your provider. You may need to reschedule your appointment if you arrive late (15 or more minutes).  Arriving late affects you and other patients whose appointments are after yours.  Also, if you miss three or more appointments without notifying the office, you may be dismissed from the clinic at the provider's discretion.      For prescription refill requests, have your pharmacy contact our office and allow 72 hours for refills to be completed.    Today you received the following chemotherapy and/or immunotherapy agents: Zirabev, Oxaliplatin    To help prevent nausea and vomiting after your treatment, we encourage you to take your nausea medication as directed.  BELOW ARE SYMPTOMS THAT SHOULD BE REPORTED IMMEDIATELY: *FEVER GREATER THAN 100.4 F (38 C) OR HIGHER *CHILLS OR SWEATING *NAUSEA AND VOMITING THAT IS NOT CONTROLLED WITH YOUR NAUSEA MEDICATION *UNUSUAL SHORTNESS OF BREATH *UNUSUAL BRUISING OR BLEEDING *URINARY PROBLEMS (pain or burning when urinating, or frequent urination) *BOWEL PROBLEMS (unusual diarrhea, constipation, pain near the anus) TENDERNESS IN MOUTH AND THROAT WITH OR WITHOUT PRESENCE OF ULCERS (sore throat, sores in mouth, or a toothache) UNUSUAL RASH, SWELLING OR PAIN  UNUSUAL VAGINAL DISCHARGE OR ITCHING   Items with * indicate a potential emergency and should be followed up as soon as possible or go to the Emergency Department if any problems should occur.  Please show the CHEMOTHERAPY ALERT CARD or IMMUNOTHERAPY ALERT CARD at  check-in to the Emergency Department and triage nurse.  Should you have questions after your visit or need to cancel or reschedule your appointment, please contact [facility]  Dept: [phone]  and follow the prompts.  Office hours are 8:00 a.m. to 4:30 p.m. Monday - Friday. Please note that voicemails left after 4:00 p.m. may not be returned until the following business day.  We are closed weekends and major holidays. You have access to a nurse at all times for urgent questions. Please call the main number to the clinic Dept: [phone] and follow the prompts.   For any non-urgent questions, you may also contact your provider using MyChart. We now offer e-Visits for anyone 40 and older to request care online for non-urgent symptoms. For details visit [email or web address].   Also download the MyChart app! Go to the app store, search "MyChart", open the app, select [facility], and log in with your MyChart username and password.  Due to Covid, a mask is required upon entering the hospital/clinic. If you do not have a mask, one will be given to you upon arrival. For doctor visits, patients may have 1 support person aged 68 or older with them. For treatment visits, patients cannot have anyone with them due to current Covid guidelines and our immunocompromised population.

## 2021-08-04 NOTE — Progress Notes (Signed)
[place]   [phone] [phone]   Clinic Follow up Note   Patient Care Team: [name], DO as PCP - General (Family Medicine) [name], MD as Consulting Physician (Hematology) Icard, Octavio Graves, DO as Consulting Physician (Pulmonary Disease) Idalou, P.A. [name], MD as Consulting Physician (General Surgery) [name], MD as Consulting Physician (General Surgery) [name], [name], MD as Consulting Physician (Gastroenterology)  Date of Service:  08/04/2021  CHIEF COMPLAINT: f/u of rectal cancer  SUMMARY OF ONCOLOGIC HISTORY: Oncology History Overview Note  Cancer Staging Rectal cancer [facility]) Staging form: Colon and Rectum, AJCC 8th Edition - Pathologic stage from 03/03/2020: Stage IIIB (pT4a, pN1b, cM0) - Signed by [name], MD on 01/28/2021 Stage prefix: Initial diagnosis Histologic grading system: 4 grade system Histologic grade (G): G2 Residual tumor (R): R0 - None    Rectal cancer ([place])  03/03/2020 - 04/05/2020 Hospital Admission   She was admitted to ED on 03/03/20 for abdominal pain and nausea. She had been having diarrhea for several weeks. During hospital stay she developed acute respiratory failure, AKI, RUE DVT from PICC line. Work up showed bowel perforation, small left liver mass and thickening of jejunum. She underwent emergent surgery on 03/03/20 for resection and colostomy placement. Her path showed invasive cancer, metastatic to 3/15 LNs. She had a NGT in placed but this was removed on POD 3. Post op her stoma became necrotic and septic. She had another bowel surgery on 03/12/20, which was NED with necrotic tissue.    03/03/2020 Imaging   CT AP 03/03/20  IMPRESSION: 1. Free intraperitoneal air consistent perforation. Most likely site of perforation is in the distal splenic flexure/proximal descending colon where there is a collection of air and gas measuring 6 centimeters. No obvious soft  tissue mass identified in this region. At this site, there is abrupt transition of dilated, stool-filled colon to completely decompressed proximal descending colon. 2. Thickened, inflamed loops of jejunum are identified within the pelvis and are likely reactive. 3. Small hiatal hernia. 4. Benign-appearing 1.6 centimeter mass the LEFT hepatic lobe. Recommend comparison with prior studies if available. 5.  Emphysema (ICD10-J43.9). 6.  Aortic Atherosclerosis (ICD10-I70.0). 7. Bilateral renal scarring.   03/03/2020 Surgery   low anterior resection end colostomy by Dr Marcello Moores    03/03/2020 Initial Biopsy   FINAL MICROSCOPIC DIAGNOSIS: 03/03/20 A. RECTOSIGMOID COLON, LOW ANTERIOR RESECTION:  - Invasive colonic adenocarcinoma, 3.5 cm.  - Tumor invades the visceral peritoneum.  - Margins of resection are not involved.  - Metastatic carcinoma in (3) of (15) lymph nodes.  - See oncology table.   B. ADDITIONAL SIGMOID COLON, RESECTION:  - Colonic tissue, negative for carcinoma.  ADDENDUM:  Mismatch Repair Protein (IHC)   SUMMARY INTERPRETATION: NORMAL  There is preserved expression of the major MMR proteins. There is a very  low probability that microsatellite instability (MSI) is present.  However, certain clinically significant MMR protein mutations may result  in preservation of nuclear expression. It is recommended that the  preservation of protein expression be correlated with molecular based  MSI testing.   IHC EXPRESSION RESULTS  TEST           RESULT  MLH1:          Preserved nuclear expression  MSH2:          Preserved nuclear expression  MSH6:          Preserved nuclear expression  PMS2:  Preserved nuclear expression   03/03/2020 Cancer Staging   Staging form: Colon and Rectum, AJCC 8th Edition - Pathologic stage from 03/03/2020: Stage IIIB (pT4a, pN1b, cM0) - Signed by [name], MD on 01/28/2021 Stage prefix: Initial diagnosis Histologic grading system: 4 grade  system Histologic grade (G): G2 Residual tumor (R): R0 - None   03/08/2020 Imaging   CT AP 03/08/20 IMPRESSION: 1. Interval midline laparotomy with distal colon resection and diverting left lower quadrant colostomy. 2. Diffuse small bowel dilatation with gas fluid levels most consistent with postoperative ileus. 3. Trace ascites within the abdomen and pelvis. No fluid collection or abscess at this time. Surgical drain within the lower pelvis. 4. Indeterminate 1.4 cm subcapsular liver hypodensity. In light of newly diagnosed rectal cancer, metastatic disease cannot be excluded. PET CT may be useful for further evaluation. 5. Interval development of trace bilateral pleural effusions and diffuse body wall edema.   03/12/2020 Surgery   EXPLORATORY LAPAROTOMY WITH BOWEL RESECTION AND COLOSTOMY by Dr Hassell Done 03/12/20  FINAL MICROSCOPIC DIAGNOSIS: 03/12/20  A. COLON, SPLENIC FLEXURE, RESECTION:  - Segment of colon (37 cm) with perforation and associated inflammation  - Multiple mucosal ulcers with necrotizing inflammation  - No evidence of malignancy    03/12/2020 Imaging   CT AP WO contrast 03/12/20  IMPRESSION: 1. Exam is limited by lack of intravenous contrast, motion, and artifact from the patient's arms adjacent to the torso. Since 03/08/2020, there has been development of relatively large pockets of intraperitoneal free air. While intraperitoneal gas would not be unexpected on postoperative day 9, this gas is new since an intervening study of 03/08/2020 and given the relatively large volume certainly raises concern for bowel perforation. No source for the intraperitoneal free air is evident on this study. There is a surgical drain in the pelvis, but there is no free gas around the drain itself to suggest that it represents the source. 2. New circumferential wall thickening in the splenic flexure, descending colon and sigmoid colon leading into the end  colostomy. Infection/inflammation would be a consideration. Ischemia cannot be excluded. 3. Relatively small volume intraperitoneal free fluid. High attenuation small fluid collections in the left upper abdomen may reflect hemorrhage, infection or residua from prior perforation. 4. Interval progression of diffuse body wall edema. 5. Residual contrast material in the renal parenchyma from prior imaging, compatible with renal dysfunction.    03/19/2020 Imaging   CT CAP 03/19/20  IMPRESSION: 1. 5.5 x 3.2 cm fluid collection is noted along the greater curvature of the proximal stomach. 2. Surgical drain is again noted in the pelvis with tip in left lower quadrant. 3. Interval development of crescent-shaped fluid collection measuring 16.4 x 3.3 cm in the epigastric region and left upper quadrant of the abdomen which may extend into the left lower quadrant. Potentially this may represent abscess or developing abscess. Multiple other smaller fluid collections are noted which may represent small abscesses. 4. 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 5. Mild amount of free fluid is noted in the posterior pelvis. 6. Moderate anasarca is noted. Aortic Atherosclerosis (ICD10-I70.0).   04/12/2020 Imaging   CT AP 04/12/20  IMPRESSION: 1. Fluid collections within the LEFT abdomen have significantly decreased compared to previous CT exams, now nearly completely resolved. 2. Percutaneous drainage catheter with tip coiled posterior to the LEFT kidney, stable positioning compared to the previous study. The more anterior catheter has been removed. 3. Small amount of free fluid persists within the abdomen and pelvis. 4.  Trace bibasilar pleural effusions. 5. Anasarca. 6. While reviewing today's study, comparing with a chest/abdomen/pelvis CT from earlier same month, there is question of thrombus in the LEFT internal jugular vein. Recommend ultrasound of the LEFT IJ to exclude DVT. This  recommendation discussed with patient's hospitalist, Dr. Owens Shark, on 04/12/2020 at 4:20 p.m.   Emphysema (ICD10-J43.9).   04/22/2020 Imaging   CT AP 04/22/20  IMPRESSION: 1. No recurrent intra-abdominal abscess status post interval removal of left retroperitoneal percutaneous drain. Stable small amount of free pelvic fluid. 2. Enlarging dependent bilateral pleural effusions, now moderate in volume. Associated atelectasis at both lung bases. 3. Progressive anasarca with generalized edema throughout the subcutaneous fat. 4. Stable small subcapsular fluid collection along the anterior aspect of the left hepatic lobe. 5. Aortic Atherosclerosis (ICD10-I70.0).   05/23/2020 Initial Diagnosis   Rectal cancer ([place])   01/14/2021 Imaging   CT CAP IMPRESSION: -5.0 cm soft tissue mass along the posterior aspect of the cecum at the base of the appendix, favored to reflect a peritoneal/serosal implant, corresponding to the patient's known adenocarcinoma. This is new from the prior. -Suspected additional pelvic implants along the uterus and right adnexa, poorly visualized, new/progressive from the prior. -Additional pericapsular lesion along the lateral spleen is mildly progressive, suspicious for additional peritoneal implant. -No evidence of metastatic disease in the chest.   01/27/2021 PET scan   IMPRESSION:  1. Extensive hypermetabolic peritoneal metastasis, as detailed  above.  2. No evidence of hypermetabolic supradiaphragmatic disease.  3. Diffuse low-level thyroid hypermetabolism can be seen in the  setting of thyroiditis. Consider correlation with thyroid function  labs.  4. Aortic atherosclerosis (ICD10-I70.0) and emphysema (ICD10-J43.9).    02/01/2021 -  Chemotherapy    First-line chemo Xeloda 1500 mg twice daily for day 1-14, every 21 days, started on 02/01/2021. Reduced to 1 week on/ 1 week off and added bevacizumab q2weeks from C2 on 02/24/21.  ----Given good tolerance, I changed to  CAPOX and bevacizumab q2weeks from C4 on 03/24/21 with Xeloda 1570m in the AM and 100103min the PM 1 week on/1 week off.     05/08/2021 Imaging   PET  IMPRESSION: 1. There is been interval development of several FDG avid pulmonary nodules within both lungs. Additionally, there is new FDG avid low left paratracheal lymph node. Thoracic metastasis cannot be excluded. 2. Interval improvement in multifocal FDG avid peritoneal metastasis as detailed above.     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 - Polypoid lesion obliterating appendiceal orifice. Biopsied to evaluate for malignancy. - The examined portion of the ileum was normal. - Two large polyps in the cecum, not removed today pending path at appendiceal orifice, bowel prep. If removed endoscopically in the future would favor EMR to be done at the hospital. - One 5 mm polyp at the ileocecal valve, removed with a cold snare. Resected and retrieved. - One 3 mm polyp in the transverse colon, removed with a cold snare. Resected and retrieved. - Parastomal hernia making initial entry to the distal colon challenging. - The examination was otherwise normal.   01/06/2021 Initial Biopsy   Diagnosis 1. Colon, biopsy, appendiceal oraface - ADENOCARCINOMA. 2. Colon, polyp(s), ileocecal valve, transverse, x2 - TUBULAR ADENOMA, NEGATIVE FOR HIGH GRADE DYSPLASIA (X1). - COLONIC MUCOSA WITH UNDERLYING LYMPHOID AGGREGATE, NEGATIVE FOR DYSPLASIA (X1).  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 This is new from  the prior. -Suspected additional pelvic implants along the uterus and right adnexa, poorly visualized, new/progressive from the prior. -Additional pericapsular lesion along the lateral spleen is mildly progressive, suspicious for additional peritoneal implant. -No evidence of metastatic disease in the chest.      CURRENT THERAPY:  First-line chemo Xeloda 1500 mg twice daily for day 1-14, every 21 days, started on 02/01/2021. Reduced to 1 week on/ 1 week off and added bevacizumab q2weeks from C2 on 02/24/21. Given good tolerance, changed to CAPOX and bevacizumab q2weeks from C4 on 03/24/21 with Xeloda 157m in the AM and 10090min the PM 1 week on/1 week off.  INTERVAL HISTORY:  Tracy FARABAUGHs here for a follow up of rectal cancer. She was last seen by me on 07/21/21. She presents to the clinic alone. She reports constipation from the Xeloda. She endorses using Colace once a day and wonders if she can increase it.   All other systems were reviewed with the patient and are negative.  MEDICAL HISTORY:  Past Medical History:  Diagnosis Date   Acute deep vein thrombosis (DVT) of brachial vein of right upper extremity (HCC)    Acute on chronic respiratory failure with hypoxia (HCC)    Asthma    Cataract    [place], HX OF 01/06/2011   Qualifier: Diagnosis of  By: BlCharlett BlakeD, Stacey     COPD (chronic obstructive pulmonary disease) ([facility]   FeV1 31% predicted FeV1/FVX 47 %   COVID-19 05/2021   Essential hypertension 05/23/2020   Hemorrhoid    Open right radial fracture 2020   Osteopenia    Perforated sigmoid colon ([facility]/21/2021   Pleural effusion    Pneumonia 2021   Postoperative intra-abdominal abscess 2021   Rectal cancer (HCC)    Seasonal allergies    takes Claritin daily prn   Shock circulatory ([facility]   Tracheostomy status ([facility]   Vitamin D deficiency    takes Vit d every 14 days    SURGICAL HISTORY: Past Surgical History:  Procedure Laterality Date    AUGMENTATION MAMMAPLASTY     saline   EXAMINATION UNDER ANESTHESIA  10/14/2012   Procedure: EXAM UNDER ANESTHESIA;  Surgeon: ErGayland CurryMD,FACS;  [facility] Service: General;  Laterality: N/A;  rectal exam under anesthesia excisional hemorrhoidectomy hemorrhoidal banding x two   EXAMINATION UNDER ANESTHESIA  02/03/2013   excision hemorrhoidal tissue   FOOT SURGERY     left bunionectomy   HEMORRHOIDECTOMY WITH HEMORRHOID BANDING  10/14/2012   Procedure: HEMORRHOIDECTOMY WITH HEMORRHOID BANDING;  Surgeon: ErGayland CurryMD,FACS;  [facility] Service: General;  Laterality: N/A;   IR IMAGING GUIDED PORT INSERTION  02/03/2021   IR THORACENTESIS ASP PLEURAL SPACE W/IMG GUIDE  04/23/2020   LAPAROTOMY N/A 03/03/2020   Procedure: low anterior resection end colostomy;  Surgeon: ThLeighton RuffMD;  Location: [facility];  Service: General;  Laterality: N/A;   LAPAROTOMY N/A 03/12/2020   Procedure: EXPLORATORY LAPAROTOMY WITH BOWEL RESECTION AND COLOSTOMY;  Surgeon: MaJohnathan HausenMD;  Location: [facility];  Service: General;  Laterality: N/A;   OPEN REDUCTION INTERNAL FIXATION (ORIF) DISTAL RADIAL FRACTURE Right 11/01/2018  Procedure: OPEN REDUCTION INTERNAL FIXATION (ORIF) DISTAL RADIAL FRACTURE;  Surgeon: [name], MD;  Location: [facility];  Service: Orthopedics;  Laterality: Right;   TONSILLECTOMY  1960   recurrent otitis media   US ECHOCARDIOGRAPHY  02/2020    poor windows, normal LV function, severely dilated RV with moderately reduced function, RV volume and pressure overload, mildly dilated RA    I have reviewed the social history and family history with the patient and they are unchanged from previous note.  ALLERGIES:  is allergic to amlodipine and codeine.  MEDICATIONS:  Current Outpatient Medications  Medication Sig Dispense Refill   albuterol (PROVENTIL) (2.5 MG/3ML) 0.083% nebulizer solution Take 3 mLs (2.5 mg total) by nebulization every 4 (four) hours as  needed for wheezing or shortness of breath. DX: J44.9 360 mL 5   albuterol (VENTOLIN HFA) 108 (90 Base) MCG/ACT inhaler Inhale 2 puffs into the lungs every 6 (six) hours as needed for wheezing or shortness of breath. 8 g 5   ALPRAZolam (XANAX) 0.25 MG tablet Take 1 tablet (0.25 mg total) by mouth at bedtime as needed for anxiety. 30 tablet 0   capecitabine (XELODA) 500 MG tablet Take 3 tablets (1,500 mg total) by mouth 2 (two) times daily after a meal. Take for 7 days on, 7 days off, repeat every 14 days. 84 tablet 1   diphenhydrAMINE (BENADRYL) 25 mg capsule Take 25 mg by mouth every 6 (six) hours as needed.     docusate sodium (COLACE) 50 MG capsule Take 50 mg by mouth daily as needed for mild constipation.     fluticasone (FLONASE) 50 MCG/ACT nasal spray Place 2 sprays into both nostrils daily. 16 mL 5   Fluticasone-Umeclidin-Vilant (TRELEGY ELLIPTA) 100-62.5-25 MCG/INH AEPB Inhale 1 puff into the lungs daily. 60 each 2   HYDROcodone bit-homatropine (HYDROMET) 5-1.5 MG/5ML syrup Take 5 mLs by mouth every 6 (six) hours as needed for cough. 120 mL 0   lidocaine-prilocaine (EMLA) cream Apply 1 application topically as needed. 30 g 0   ondansetron (ZOFRAN) 8 MG tablet Take 1 tablet (8 mg total) by mouth every 8 (eight) hours as needed for nausea or vomiting. 20 tablet 2   traMADol (ULTRAM) 50 MG tablet Take 1 tablet (50 mg total) by mouth every 6 (six) hours as needed. 30 tablet 0   No current facility-administered medications for this visit.   Facility-Administered Medications Ordered in Other Visits  Medication Dose Route Frequency Provider Last Rate Last Admin   sodium chloride flush (NS) 0.9 % injection 10 mL  10 mL Intracatheter PRN [name], MD   10 mL at 08/04/21 1517    PHYSICAL EXAMINATION: ECOG PERFORMANCE STATUS: 1 - Symptomatic but completely ambulatory  Vitals:   08/04/21 1113  BP: 138/82  Pulse: 94  Resp: (!) 21  Temp: 97.6 F (36.4 C)  SpO2: 93%   Wt Readings from Last  3 Encounters:  08/04/21 123 lb 4.8 oz (55.9 kg)  07/21/21 123 lb 1.6 oz (55.8 kg)  07/07/21 123 lb 14.4 oz (56.2 kg)     GENERAL:alert, no distress and comfortable SKIN: skin color normal, no rashes or significant lesions EYES: normal, Conjunctiva are pink and non-injected, sclera clear  NEURO: alert & oriented x 3 with fluent speech  LABORATORY DATA:  I have reviewed the data as listed CBC Latest Ref Rng & Units 08/04/2021 07/21/2021 07/07/2021  WBC 4.0 - 10.5 K/uL 8.7 8.4 8.2  Hemoglobin 12.0 - 15.0 g/dL 12.8 12.1  11.2(L)  Hematocrit 36.0 - 46.0 % 38.7 35.6(L) 33.8(L)  Platelets 150 - 400 K/uL 261 228 279     CMP Latest Ref Rng & Units 08/04/2021 07/21/2021 07/07/2021  Glucose 70 - 99 mg/dL 97 94 98  BUN 8 - 23 mg/dL _0 Creatinine 0.44 - 1.00 mg/dL 1.01(H) 0.83 0.79  Sodium 135 - 145 mmol/L 139 142 138  Potassium 3.5 - 5.1 mmol/L 4.1 3.9 3.9  Chloride 98 - 111 mmol/L 103 108 105  CO2 22 - 32 mmol/L _1 Calcium 8.9 - 10.3 mg/dL 9.3 9.3 9.2  Total Protein 6.5 - 8.1 g/dL 7.1 6.8 6.5  Total Bilirubin 0.3 - 1.2 mg/dL 0.5 0.5 0.4  Alkaline Phos 38 - 126 U/L 109 118 105  AST 15 - 41 U/L _2 ALT 0 - 44 U/L _3 RADIOGRAPHIC STUDIES: I have personally reviewed the radiological images as listed and agreed with the findings in the report. No results found.   ASSESSMENT & PLAN:  CLAUDETTA SALLIE is a 70 y.o. female with   1. [name], MMR normal, peritoneal metastasis in 01/2021 -diagnosed 02/2020, due to perforation underwent emergent surgery. Path showed invasive adenocarcinoma with perforation, +3/15 LNs, and clear margins. Initial CT scan negative for distant metastasis. -due to bowel perforation and + LNs she has very high risk for recurrence, unfortunately she was not a candidate for adjuvant chemo due to very slow recovery from surgery -Guardant Reveal ctDNA were all negative x3 (06/06/20, 07/12/20, and  09/10/20). -surveillance CT 08/27/20 showed a small density adjacent to the spleen that had decreased, no other evidence of recurrent or metastatic disease -first surveillance colonoscopy by Dr. Havery Moros 01/06/21 showed new adenocarcinoma at appendiceal orifice and other tubular adenomas throughout the colon.  Biopsy confirmed adenocarcinoma -01/27/21 PET showed tumor in appendiceal orifice is likely peritoneal metastasis growing into the cecum.  Her peritoneal implants are mainly in the pelvis, difficult to biopsy. -She began single agent Xeloda with cycle 1 on 02/01/21.  Due to low performance status with cycle 2 was changed to 1 week on/1 week off, and beva was added. She tolerates this well -PS improved and low dose oxaliplatin was added with C4, now on CAPOX/beva q2 weeks, with Xeloda 1 week on/1 week off. -PET scan 05/08/21 showed good response to peritoneal metastasis, with the development of several FDG avid pulmonary nodules, and hypermetabolic left paratracheal node, the differential includes infectious/inflammatory versus thoracic metastasis. Given her COPD and good response in abdomen, I think this is likely inflammatory  -Chemo held briefly due to COVID-19 and respiratory infection -she is clinically doing well. Lab reviewed, adequate for treatment, will proceed oxaliplatin and bevacizumab today. -she is scheduled for restaging CT CAP on 08/14/21.   2. COVID-19+ 05/21/21 and respiratory infection -she received CAPOX/beva on 05/19/21, tested positive on 05/21/21 -treated with paxlovid and subsequently doxycycline, augmentin, and prednisone -chest x-ray 06/23/21 showed only emphysema -resolved   2. H/o provoked RUE DVT 04/04/20 s/p 3 months AC (Eliquis)   3. Comorbidities: Asthma, COPD, anxiety -on nebulizer and trelegy, has not needed rescue inhaler recently -f/u with pulmonology and PCP     PLAN: -proceed with C9 CAPOX/bevacizumab today -I refilled Xeloda -CT CAP on 08/14/21  (scheduled) -labs, f/u with NP Lacie, and C10 CAPOX/bevacizumab on 08/19/21    No problem-specific Assessment & Plan notes found for this encounter.   No orders  of the defined types were placed in this encounter.  All questions were answered. The patient knows to call the clinic with any problems, questions or concerns. No barriers to learning was detected. The total time spent in the appointment was 30 minutes.     [name], MD 08/04/2021   I, Wilburn Mylar, am acting as scribe for [name], MD.   I have reviewed the above documentation for accuracy and completeness, and I agree with the above.

## 2021-08-14 ENCOUNTER — Inpatient Hospital Stay: Payer: Medicare HMO | Attending: Hematology

## 2021-08-14 ENCOUNTER — Other Ambulatory Visit: Payer: Self-pay | Admitting: Hematology

## 2021-08-14 ENCOUNTER — Encounter: Payer: Self-pay | Admitting: Hematology

## 2021-08-14 ENCOUNTER — Ambulatory Visit (HOSPITAL_COMMUNITY)
Admission: RE | Admit: 2021-08-14 | Discharge: 2021-08-14 | Disposition: A | Discharge status: Home / Self Care | Payer: Medicare HMO | Source: Ambulatory Visit | Attending: Hematology | Admitting: Hematology

## 2021-08-14 ENCOUNTER — Other Ambulatory Visit: Payer: Self-pay

## 2021-08-14 ENCOUNTER — Other Ambulatory Visit (HOSPITAL_COMMUNITY): Payer: Self-pay

## 2021-08-14 DIAGNOSIS — I2699 Other pulmonary embolism without acute cor pulmonale: Secondary | ICD-10-CM | POA: Diagnosis not present

## 2021-08-14 DIAGNOSIS — C19 Malignant neoplasm of rectosigmoid junction: Secondary | ICD-10-CM | POA: Insufficient documentation

## 2021-08-14 DIAGNOSIS — Z5111 Encounter for antineoplastic chemotherapy: Secondary | ICD-10-CM | POA: Insufficient documentation

## 2021-08-14 DIAGNOSIS — C786 Secondary malignant neoplasm of retroperitoneum and peritoneum: Secondary | ICD-10-CM | POA: Insufficient documentation

## 2021-08-14 DIAGNOSIS — C20 Malignant neoplasm of rectum: Secondary | ICD-10-CM | POA: Insufficient documentation

## 2021-08-14 DIAGNOSIS — Z79899 Other long term (current) drug therapy: Secondary | ICD-10-CM | POA: Diagnosis not present

## 2021-08-14 DIAGNOSIS — K7689 Other specified diseases of liver: Secondary | ICD-10-CM | POA: Diagnosis not present

## 2021-08-14 DIAGNOSIS — Z86718 Personal history of other venous thrombosis and embolism: Secondary | ICD-10-CM | POA: Diagnosis not present

## 2021-08-14 DIAGNOSIS — Z7901 Long term (current) use of anticoagulants: Secondary | ICD-10-CM | POA: Diagnosis not present

## 2021-08-14 DIAGNOSIS — K439 Ventral hernia without obstruction or gangrene: Secondary | ICD-10-CM | POA: Diagnosis not present

## 2021-08-14 DIAGNOSIS — C799 Secondary malignant neoplasm of unspecified site: Secondary | ICD-10-CM

## 2021-08-14 DIAGNOSIS — I10 Essential (primary) hypertension: Secondary | ICD-10-CM | POA: Insufficient documentation

## 2021-08-14 DIAGNOSIS — Z5112 Encounter for antineoplastic immunotherapy: Secondary | ICD-10-CM | POA: Insufficient documentation

## 2021-08-14 DIAGNOSIS — I700 Atherosclerosis of aorta: Secondary | ICD-10-CM | POA: Diagnosis not present

## 2021-08-14 DIAGNOSIS — C181 Malignant neoplasm of appendix: Secondary | ICD-10-CM | POA: Diagnosis present

## 2021-08-14 DIAGNOSIS — C189 Malignant neoplasm of colon, unspecified: Secondary | ICD-10-CM | POA: Diagnosis not present

## 2021-08-14 DIAGNOSIS — J439 Emphysema, unspecified: Secondary | ICD-10-CM | POA: Diagnosis not present

## 2021-08-14 MED ORDER — IOHEXOL 350 MG/ML SOLN
80.0000 mL | Freq: Once | INTRAVENOUS | Status: AC | PRN
  Administered 2021-08-14: 80 mL via INTRAVENOUS

## 2021-08-14 MED ORDER — HEPARIN SOD (PORK) LOCK FLUSH 100 UNIT/ML IV SOLN
500.0000 [IU] | Freq: Once | INTRAVENOUS | Status: AC
  Administered 2021-08-14: 500 [IU] via INTRAVENOUS

## 2021-08-14 MED ORDER — HEPARIN SOD (PORK) LOCK FLUSH 100 UNIT/ML IV SOLN
INTRAVENOUS | Status: AC
  Filled 2021-08-14: qty 5

## 2021-08-14 MED ORDER — RIVAROXABAN (XARELTO) VTE STARTER PACK (15 & 20 MG)
ORAL_TABLET | ORAL | 0 refills | Status: DC
  Filled 2021-08-14: qty 51, 30d supply, fill #0

## 2021-08-14 MED ORDER — SODIUM CHLORIDE 0.9% FLUSH
10.0000 mL | Freq: Once | INTRAVENOUS | Status: AC
  Administered 2021-08-14: 10 mL

## 2021-08-15 ENCOUNTER — Telehealth: Payer: Self-pay

## 2021-08-15 MED FILL — Dexamethasone Sodium Phosphate Inj 100 MG/10ML: INTRAMUSCULAR | Qty: 1 | Status: AC

## 2021-08-15 NOTE — Telephone Encounter (Signed)
This nurse spoke with patient and made aware that she has a small blood clot in her lungs.  Patient denies any unusual SOB, denies pain and no swelling of her extremities.  Patient made aware that MD has called in an order for Xarelto to [facility] for pickup.  Patient acknowledges understanding.  No further questions or concerns at this time.  Patient know to call clinic if there are any concerns or problems.

## 2021-08-18 NOTE — Progress Notes (Signed)
[name]   [phone] [phone]   Clinic Follow up Note   Patient Care Team: [name], DO as PCP - General (Family Medicine) [name], MD as Consulting Physician (Hematology) Icard, Octavio Graves, DO as Consulting Physician (Pulmonary Disease) [facility] [name], MD as Consulting Physician (General Surgery) [name], MD as Consulting Physician (General Surgery) [name], [name], MD as Consulting Physician (Gastroenterology) 08/19/2021  CHIEF COMPLAINT: F/up rectal cancer   SUMMARY OF ONCOLOGIC HISTORY: Oncology History Overview Note  Cancer Staging Rectal cancer [facility]) Staging form: Colon and Rectum, AJCC 8th Edition - Pathologic stage from 03/03/2020: Stage IIIB (pT4a, pN1b, cM0) - Signed by [name], MD on 01/28/2021 Stage prefix: Initial diagnosis Histologic grading system: 4 grade system Histologic grade (G): G2 Residual tumor (R): R0 - None    Rectal cancer ([place])  03/03/2020 - 04/05/2020 Hospital Admission   She was admitted to ED on 03/03/20 for abdominal pain and nausea. She had been having diarrhea for several weeks. During hospital stay she developed acute respiratory failure, AKI, RUE DVT from PICC line. Work up showed bowel perforation, small left liver mass and thickening of jejunum. She underwent emergent surgery on 03/03/20 for resection and colostomy placement. Her path showed invasive cancer, metastatic to 3/15 LNs. She had a NGT in placed but this was removed on POD 3. Post op her stoma became necrotic and septic. She had another bowel surgery on 03/12/20, which was NED with necrotic tissue.    03/03/2020 Imaging   CT AP 03/03/20  IMPRESSION: 1. Free intraperitoneal air consistent perforation. Most likely site of perforation is in the distal splenic flexure/proximal descending colon where there is a collection of air and gas measuring 6 centimeters. No obvious soft tissue mass identified in  this region. At this site, there is abrupt transition of dilated, stool-filled colon to completely decompressed proximal descending colon. 2. Thickened, inflamed loops of jejunum are identified within the pelvis and are likely reactive. 3. Small hiatal hernia. 4. Benign-appearing 1.6 centimeter mass the LEFT hepatic lobe. Recommend comparison with prior studies if available. 5.  Emphysema (ICD10-J43.9). 6.  Aortic Atherosclerosis (ICD10-I70.0). 7. Bilateral renal scarring.   03/03/2020 Surgery   low anterior resection end colostomy by Dr Marcello Moores    03/03/2020 Initial Biopsy   FINAL MICROSCOPIC DIAGNOSIS: 03/03/20 A. RECTOSIGMOID COLON, LOW ANTERIOR RESECTION:  - Invasive colonic adenocarcinoma, 3.5 cm.  - Tumor invades the visceral peritoneum.  - Margins of resection are not involved.  - Metastatic carcinoma in (3) of (15) lymph nodes.  - See oncology table.   B. ADDITIONAL SIGMOID COLON, RESECTION:  - Colonic tissue, negative for carcinoma.  ADDENDUM:  Mismatch Repair Protein (IHC)   SUMMARY INTERPRETATION: NORMAL  There is preserved expression of the major MMR proteins. There is a very  low probability that microsatellite instability (MSI) is present.  However, certain clinically significant MMR protein mutations may result  in preservation of nuclear expression. It is recommended that the  preservation of protein expression be correlated with molecular based  MSI testing.   IHC EXPRESSION RESULTS  TEST           RESULT  MLH1:          Preserved nuclear expression  MSH2:          Preserved nuclear expression  MSH6:          Preserved nuclear expression  PMS2:  Preserved nuclear expression   03/03/2020 Cancer Staging   Staging form: Colon and Rectum, AJCC 8th Edition - Pathologic stage from 03/03/2020: Stage IIIB (pT4a, pN1b, cM0) - Signed by [name], MD on 01/28/2021 Stage prefix: Initial diagnosis Histologic grading system: 4 grade system Histologic grade (G):  G2 Residual tumor (R): R0 - None   03/08/2020 Imaging   CT AP 03/08/20 IMPRESSION: 1. Interval midline laparotomy with distal colon resection and diverting left lower quadrant colostomy. 2. Diffuse small bowel dilatation with gas fluid levels most consistent with postoperative ileus. 3. Trace ascites within the abdomen and pelvis. No fluid collection or abscess at this time. Surgical drain within the lower pelvis. 4. Indeterminate 1.4 cm subcapsular liver hypodensity. In light of newly diagnosed rectal cancer, metastatic disease cannot be excluded. PET CT may be useful for further evaluation. 5. Interval development of trace bilateral pleural effusions and diffuse body wall edema.   03/12/2020 Surgery   EXPLORATORY LAPAROTOMY WITH BOWEL RESECTION AND COLOSTOMY by Dr Hassell Done 03/12/20  FINAL MICROSCOPIC DIAGNOSIS: 03/12/20  A. COLON, SPLENIC FLEXURE, RESECTION:  - Segment of colon (37 cm) with perforation and associated inflammation  - Multiple mucosal ulcers with necrotizing inflammation  - No evidence of malignancy    03/12/2020 Imaging   CT AP WO contrast 03/12/20  IMPRESSION: 1. Exam is limited by lack of intravenous contrast, motion, and artifact from the patient's arms adjacent to the torso. Since 03/08/2020, there has been development of relatively large pockets of intraperitoneal free air. While intraperitoneal gas would not be unexpected on postoperative day 9, this gas is new since an intervening study of 03/08/2020 and given the relatively large volume certainly raises concern for bowel perforation. No source for the intraperitoneal free air is evident on this study. There is a surgical drain in the pelvis, but there is no free gas around the drain itself to suggest that it represents the source. 2. New circumferential wall thickening in the splenic flexure, descending colon and sigmoid colon leading into the end colostomy. Infection/inflammation would be a consideration.  Ischemia cannot be excluded. 3. Relatively small volume intraperitoneal free fluid. High attenuation small fluid collections in the left upper abdomen may reflect hemorrhage, infection or residua from prior perforation. 4. Interval progression of diffuse body wall edema. 5. Residual contrast material in the renal parenchyma from prior imaging, compatible with renal dysfunction.    03/19/2020 Imaging   CT CAP 03/19/20  IMPRESSION: 1. 5.5 x 3.2 cm fluid collection is noted along the greater curvature of the proximal stomach. 2. Surgical drain is again noted in the pelvis with tip in left lower quadrant. 3. Interval development of crescent-shaped fluid collection measuring 16.4 x 3.3 cm in the epigastric region and left upper quadrant of the abdomen which may extend into the left lower quadrant. Potentially this may represent abscess or developing abscess. Multiple other smaller fluid collections are noted which may represent small abscesses. 4. 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 5. Mild amount of free fluid is noted in the posterior pelvis. 6. Moderate anasarca is noted. Aortic Atherosclerosis (ICD10-I70.0).   04/12/2020 Imaging   CT AP 04/12/20  IMPRESSION: 1. Fluid collections within the LEFT abdomen have significantly decreased compared to previous CT exams, now nearly completely resolved. 2. Percutaneous drainage catheter with tip coiled posterior to the LEFT kidney, stable positioning compared to the previous study. The more anterior catheter has been removed. 3. Small amount of free fluid persists within the abdomen and pelvis. 4.  Trace bibasilar pleural effusions. 5. Anasarca. 6. While reviewing today's study, comparing with a chest/abdomen/pelvis CT from earlier same month, there is question of thrombus in the LEFT internal jugular vein. Recommend ultrasound of the LEFT IJ to exclude DVT. This recommendation discussed with patient's hospitalist, Dr. Owens Shark, on  04/12/2020 at 4:20 p.m.   Emphysema (ICD10-J43.9).   04/22/2020 Imaging   CT AP 04/22/20  IMPRESSION: 1. No recurrent intra-abdominal abscess status post interval removal of left retroperitoneal percutaneous drain. Stable small amount of free pelvic fluid. 2. Enlarging dependent bilateral pleural effusions, now moderate in volume. Associated atelectasis at both lung bases. 3. Progressive anasarca with generalized edema throughout the subcutaneous fat. 4. Stable small subcapsular fluid collection along the anterior aspect of the left hepatic lobe. 5. Aortic Atherosclerosis (ICD10-I70.0).   05/23/2020 Initial Diagnosis   Rectal cancer ([place])   01/14/2021 Imaging   CT CAP IMPRESSION: -5.0 cm soft tissue mass along the posterior aspect of the cecum at the base of the appendix, favored to reflect a peritoneal/serosal implant, corresponding to the patient's known adenocarcinoma. This is new from the prior. -Suspected additional pelvic implants along the uterus and right adnexa, poorly visualized, new/progressive from the prior. -Additional pericapsular lesion along the lateral spleen is mildly progressive, suspicious for additional peritoneal implant. -No evidence of metastatic disease in the chest.   01/27/2021 PET scan   IMPRESSION:  1. Extensive hypermetabolic peritoneal metastasis, as detailed  above.  2. No evidence of hypermetabolic supradiaphragmatic disease.  3. Diffuse low-level thyroid hypermetabolism can be seen in the  setting of thyroiditis. Consider correlation with thyroid function  labs.  4. Aortic atherosclerosis (ICD10-I70.0) and emphysema (ICD10-J43.9).    02/01/2021 -  Chemotherapy    First-line chemo Xeloda 1500 mg twice daily for day 1-14, every 21 days, started on 02/01/2021. Reduced to 1 week on/ 1 week off and added bevacizumab q2weeks from C2 on 02/24/21.  ----Given good tolerance, I changed to CAPOX and bevacizumab q2weeks from C4 on 03/24/21 with Xeloda 1532m  in the AM and 10051min the PM 1 week on/1 week off.     05/08/2021 Imaging   PET  IMPRESSION: 1. There is been interval development of several FDG avid pulmonary nodules within both lungs. Additionally, there is new FDG avid low left paratracheal lymph node. Thoracic metastasis cannot be excluded. 2. Interval improvement in multifocal FDG avid peritoneal metastasis as detailed above.     Malign[place] 01/06/2021 Procedure   (surveillance colonoscopy for h/o rectal cancer) Impression:  - Preparation of the colon was fair, lavage performed with mostly adequate views. - Polypoid lesion obliterating appendiceal orifice. Biopsied to evaluate for malignancy. - The examined portion of the ileum was normal. - Two large polyps in the cecum, not removed today pending path at appendiceal orifice, bowel prep. If removed endoscopically in the future would favor EMR to be done at the hospital. - One 5 mm polyp at the ileocecal valve, removed with a cold snare. Resected and retrieved. - One 3 mm polyp in the transverse colon, removed with a cold snare. Resected and retrieved. - Parastomal hernia making initial entry to the distal colon challenging. - The examination was otherwise normal.   01/06/2021 Initial Biopsy   Diagnosis 1. Colon, biopsy, appendiceal oraface - ADENOCARCINOMA. 2. Colon, polyp(s), ileocecal valve, transverse, x2 - TUBULAR ADENOMA, NEGATIVE FOR HIGH GRADE DYSPLASIA (X1). - COLONIC MUCOSA WITH UNDERLYING LYMPHOID AGGREGATE, NEGATIVE FOR DYSPLASIA (X1).  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 This is new from the prior. -Suspected additional pelvic implants along the uterus  and right adnexa, poorly visualized, new/progressive from the prior. -Additional pericapsular lesion along the lateral spleen is mildly progressive, suspicious for additional peritoneal implant. -No evidence of metastatic disease in the chest.   08/14/2021 Imaging   CT CAP IMPRESSION: Resolution of previously seen small bilateral pulmonary nodules since previous study. Stable 8 mm mediastinal lymph node in the AP window. No evidence of metastatic disease within the abdomen or pelvis. Nonocclusive pulmonary embolism in bilateral central lower lobe pulmonary arteries, which appears subacute in age.  Aortic Atherosclerosis (ICD10-I70.0) and Emphysema (ICD10-J43.9).       CURRENT THERAPY:  First-line chemo Xeloda 1500 mg twice daily for day 1-14, every 21 days, started on 02/01/2021. Reduced to 1 week on/ 1 week off and added bevacizumab q2weeks from C2 on 02/24/21. Given good tolerance, changed to CAPOX and bevacizumab q2weeks from C4 on 03/24/21 with Xeloda 1541m in the AM and 10016min the PM 1 week on/1 week off.  INTERVAL HISTORY: Ms. OgBracewelleturns for follow up and treatment as scheduled. Last seen 08/04/21 and began another cycle of CAPOX/beva. She underwent restaging CT on 08/14/21 that showed small incidental PE, she started xarelto.  Her breathing is at baseline, denies worsening cough, new chest pain, increased dyspnea, calf pain, or leg swelling.  Denies bleeding on Xarelto.  She started another cycle of Xeloda on 9/5, 3 tabs twice daily for 1 week on and 1 week off.  She gets nauseous thinking about the evening dose, Zofran does not help.  Manages constipation after oxali with Colace, up to twice per day.  Cold sensitivity lasts 1 week, denies residual neuropathy in the absence of cold exposure.  Denies mucositis, hand foot redness, pain, or peeling/cracks, rash, fever, chills, new/worsening pain, or other concerns.   MEDICAL HISTORY:  Past Medical History:  Diagnosis Date   Acute  deep vein thrombosis (DVT) of brachial vein of right upper extremity (HCC)    Acute on chronic respiratory failure with hypoxia (HCC)    Asthma    Cataract    CHICKENPOX, HX OF 01/06/2011   Qualifier: Diagnosis of  By: BlCharlett BlakeD, Stacey     COPD (chronic obstructive pulmonary disease) ([facility]   FeV1 31% predicted FeV1/FVX 47 %   COVID-19 05/2021   Essential hypertension 05/23/2020   Hemorrhoid    Open right radial fracture 2020   Osteopenia    Perforated sigmoid colon ([facility]03/21/2021   Pleural effusion    Pneumonia 2021   Postoperative intra-abdominal abscess 2021   Rectal cancer (HCC)    Seasonal allergies    takes Claritin daily prn   Shock circulatory (HCMagazine   Tracheostomy status ([facility]   Vitamin D deficiency    takes Vit d every 14 days    SURGICAL HISTORY: Past Surgical History:  Procedure Laterality Date   AUGMENTATION MAMMAPLASTY     saline   EXAMINATION UNDER ANESTHESIA  10/14/2012   Procedure: EXAM UNDER ANESTHESIA;  Surgeon: ErGayland CurryMD,FACS;  [place] Service: General;  Laterality: N/A;  rectal exam under anesthesia excisional hemorrhoidectomy hemorrhoidal banding x two   EXAMINATION UNDER ANESTHESIA  02/03/2013   excision hemorrhoidal tissue   FOOT SURGERY     left bunionectomy   HEMORRHOIDECTOMY WITH HEMORRHOID BANDING  10/14/2012   Procedure: HEMORRHOIDECTOMY WITH HEMORRHOID BANDING;  Surgeon: Gayland Curry, MD,FACS;  Location: [place];  Service: General;  Laterality: N/A;   IR IMAGING GUIDED PORT INSERTION  02/03/2021   IR THORACENTESIS ASP PLEURAL SPACE W/IMG GUIDE  04/23/2020   LAPAROTOMY N/A 03/03/2020   Procedure: low anterior resection end colostomy;  Surgeon: [name], MD;  Location: [facility];  Service: General;  Laterality: N/A;   LAPAROTOMY N/A 03/12/2020   Procedure: EXPLORATORY LAPAROTOMY WITH BOWEL RESECTION AND COLOSTOMY;  Surgeon: [name], MD;  Location: [facility];  Service: General;  Laterality: N/A;   OPEN REDUCTION INTERNAL  FIXATION (ORIF) DISTAL RADIAL FRACTURE Right 11/01/2018   Procedure: OPEN REDUCTION INTERNAL FIXATION (ORIF) DISTAL RADIAL FRACTURE;  Surgeon: [name], MD;  Location: [place];  Service: Orthopedics;  Laterality: Right;   TONSILLECTOMY  1960   recurrent otitis media   US ECHOCARDIOGRAPHY  02/2020    poor windows, normal LV function, severely dilated RV with moderately reduced function, RV volume and pressure overload, mildly dilated RA    I have reviewed the social history and family history with the patient and they are unchanged from previous note.  ALLERGIES:  is allergic to amlodipine and codeine.  MEDICATIONS:  Current Outpatient Medications  Medication Sig Dispense Refill   prochlorperazine (COMPAZINE) 10 MG tablet Take 1 tablet (10 mg total) by mouth every 6 (six) hours as needed for nausea or vomiting. 30 tablet 2   albuterol (PROVENTIL) (2.5 MG/3ML) 0.083% nebulizer solution Take 3 mLs (2.5 mg total) by nebulization every 4 (four) hours as needed for wheezing or shortness of breath. DX: J44.9 360 mL 5   albuterol (VENTOLIN HFA) 108 (90 Base) MCG/ACT inhaler Inhale 2 puffs into the lungs every 6 (six) hours as needed for wheezing or shortness of breath. 8 g 5   ALPRAZolam (XANAX) 0.25 MG tablet Take 1 tablet (0.25 mg total) by mouth at bedtime as needed for anxiety. 30 tablet 0   capecitabine (XELODA) 500 MG tablet Take 3 tablets (1,500 mg total) by mouth 2 (two) times daily after a meal. Take for 7 days on, 7 days off, repeat every 14 days. 84 tablet 1   diphenhydrAMINE (BENADRYL) 25 mg capsule Take 25 mg by mouth every 6 (six) hours as needed.     docusate sodium (COLACE) 50 MG capsule Take 50 mg by mouth daily as needed for mild constipation.     fluticasone (FLONASE) 50 MCG/ACT nasal spray Place 2 sprays into both nostrils daily. 16 mL 5   Fluticasone-Umeclidin-Vilant (TRELEGY ELLIPTA) 100-62.5-25 MCG/INH AEPB Inhale 1 puff into the lungs daily. 60 each 2    HYDROcodone bit-homatropine (HYDROMET) 5-1.5 MG/5ML syrup Take 5 mLs by mouth every 6 (six) hours as needed for cough. 120 mL 0   lidocaine-prilocaine (EMLA) cream Apply 1 application topically as needed. 30 g 0   ondansetron (ZOFRAN) 8 MG tablet Take 1 tablet (8 mg total) by mouth every 8 (eight) hours as needed for nausea or vomiting. 20 tablet 2   RIVAROXABAN (XARELTO) VTE STARTER PACK (15 & 20 MG) Follow package directions: Take one 39m tablet by mouth twice a day. On day 22, switch to one 27mtablet once a day. Take with food. 51 each 0   traMADol (ULTRAM) 50 MG tablet Take 1 tablet (50 mg total) by mouth  every 6 (six) hours as needed. 30 tablet 0   No current facility-administered medications for this visit.   Facility-Administered Medications Ordered in Other Visits  Medication Dose Route Frequency Provider Last Rate Last Admin   heparin lock flush 100 unit/mL  500 Units Intracatheter Once PRN [name], MD       oxaliplatin (ELOXATIN) 115 mg in dextrose 5 % 500 mL chemo infusion  70 mg/m2 (Treatment Plan Recorded) Intravenous Once [name], MD       sodium chloride flush (NS) 0.9 % injection 10 mL  10 mL Intracatheter PRN [name], MD        PHYSICAL EXAMINATION: ECOG PERFORMANCE STATUS: 1 - Symptomatic but completely ambulatory  Vitals:   08/19/21 0938  BP: (!) 153/86  Pulse: 93  Resp: 18  Temp: 97.6 F (36.4 C)  SpO2: 95%   Filed Weights   08/19/21 0938  Weight: 122 lb 12.8 oz (55.7 kg)    GENERAL:alert, no distress and comfortable SKIN: No rash.  Palms without significant erythema EYES: sclera clear OROPHARYNX: No thrush or ulcers LUNGS: Decreased with normal breathing effort HEART: regular rate & rhythm, no lower extremity edema ABDOMEN:abdomen soft, non-tender and normal bowel sounds.  Light-colored liquid stool in collection bag NEURO: alert & oriented x 3 with fluent speech, no focal motor/sensory deficits PAC without erythema  LABORATORY DATA:  I have  reviewed the data as listed CBC Latest Ref Rng & Units 08/19/2021 08/04/2021 07/21/2021  WBC 4.0 - 10.5 K/uL 5.9 8.7 8.4  Hemoglobin 12.0 - 15.0 g/dL 12.3 12.8 12.1  Hematocrit 36.0 - 46.0 % 35.9(L) 38.7 35.6(L)  Platelets 150 - 400 K/uL 247 261 228     CMP Latest Ref Rng & Units 08/19/2021 08/04/2021 07/21/2021  Glucose 70 - 99 mg/dL 104(H) 97 94  BUN 8 - 23 mg/dL 17 13 17   Creatinine 0.44 - 1.00 mg/dL 0.81 1.01(H) 0.83  Sodium 135 - 145 mmol/L 141 139 142  Potassium 3.5 - 5.1 mmol/L 3.7 4.1 3.9  Chloride 98 - 111 mmol/L 106 103 108  CO2 22 - 32 mmol/L 23 24 22   Calcium 8.9 - 10.3 mg/dL 9.4 9.3 9.3  Total Protein 6.5 - 8.1 g/dL 6.8 7.1 6.8  Total Bilirubin 0.3 - 1.2 mg/dL 0.4 0.5 0.5  Alkaline Phos 38 - 126 U/L 93 109 118  AST 15 - 41 U/L 22 18 27   ALT 0 - 44 U/L 11 13 18       RADIOGRAPHIC STUDIES: I have personally reviewed the radiological images as listed and agreed with the findings in the report. No results found.   ASSESSMENT & PLAN: 70 yo female   1. [name], MMR normal, peritoneal metastasis in 01/2021 -diagnosed 02/2020, due to perforation underwent emergent surgery. Path showed invasive adenocarcinoma with perforation, +3/15 LNs, and clear margins. Initial CT scan negative for distant metastasis. -due to bowel perforation and + LNs she has very high risk for recurrence, unfortunately she was not a candidate for adjuvant chemo due to very slow recovery from surgery -Guardant Reveal ctDNA were all negative x3 (06/06/20, 07/12/20, and 09/10/20). -surveillance CT 08/27/20 showed a small density adjacent to the spleen that had decreased, no other evidence of recurrent or metastatic disease -first surveillance colonoscopy by Dr. Havery Moros 01/06/21 showed new adenocarcinoma at appendiceal orifice and other tubular adenomas throughout the colon.  Biopsy confirmed adenocarcinoma -01/27/2021 PET showed tumor in appendiceal orifice is likely peritoneal  metastasis growing into the  cecum.  Her peritoneal implants are mainly in the pelvis, difficult to biopsy -Dr. Burr Medico recommended first-line Capox, she began single agent Xeloda with cycle 1 on 2/19.  Due to low performance status with cycle 2 was changed to 1 week on/1 week off, and beva was added. She tolerates this well -PS improved and low dose oxaliplatin was added with C4, now on Capeox/beva q2 weeks, with Xeloda 1 week on/1 week off. -PET scan 05/08/2021 showed good response and peritoneal metastasis, with the development of several FDG avid pulmonary nodules, and hypermetabolic left paratracheal node, the differential includes infectious/inflammatory versus thoracic metastasis -She subsequently developed COVID-19 and respiratory infection land chemo was held briefly, she recovered well -Restaging CT CAP 08/14/21 resolved bilat pulmonary nodules, stable 8 mm LN in AP window, and no peritoneal masses. Overall good clinical response to chemo -Continue CAPOX with xeloda 1500 mg BID 1 week on/1 week off and oxali q2 weeks  2. Bilateral PE, found on restaging CT CAP 08/14/21  -no hypoxia, stable respiratory status  -no need for inpatient management, she began xarelto 08/15/21 -hold Beva for 1-2 months given recent PE   3. COVID-19 + 05/21/21 and respiratory infection -she received CAPOX/beva on 05/19/21, tested positive on 05/21/21 -treated with paxlovid and subsequently doxycycline, augment, and prednisone  -CXR 06/23/21 showed only emphysema -resolved    4. H/o provoked RUE DVT 04/04/20 s/p 3 months AC (Eliquis)   5. Comorbidities: Asthma, COPD, anxiety -on nebulizer and trelegy, has not needed rescue inhaler recently -f/up pulmonology and PCP  Disposition: Ms. Brumm appears stable.  She continues Capox q2 weeks, Xeloda 1500 mg twice daily 1 week on and 1 week off, and bevacizumab q2 weeks.  She tolerates treatment well with mild nausea, constipation and cold sensitivity.  Side effects are partially  managed with supportive care at home. I sent in Compazine Rx. She is able to recover and function well.  I reviewed her restaging CT CAP personally and with Dr. Burr Medico and discussed with Ms. Loving today.  She had incidental finding of nonocclusive bilateral PE.  She is not hypoxic, no new/worsening pulmonary symptoms.  She has started Xarelto.  Will hold bevacizumab for now.  Previously seen bilateral pulmonary nodules resolved (never biopsy proven, differential included infectious vs malignant), no peritoneal disease was identified.  An 8 mm mediastinal lymph node is stable.  Overall no signs of disease progression, she has responded well to chemo.  The plan is to continue CAPOX for now, we discussed discontinuing oxaliplatin in the future. Labs reviewed, adequate for treatment.   She will return for f/up and next cycle CAPOX in 2 weeks.   All questions were answered. The patient knows to call the clinic with any problems, questions or concerns. No barriers to learning were detected. Total encounter time was 30 minutes.      Alla Feeling, NP 08/19/21

## 2021-08-19 ENCOUNTER — Inpatient Hospital Stay (HOSPITAL_BASED_OUTPATIENT_CLINIC_OR_DEPARTMENT_OTHER): Payer: Medicare HMO | Admitting: Nurse Practitioner

## 2021-08-19 ENCOUNTER — Inpatient Hospital Stay: Payer: Medicare HMO

## 2021-08-19 ENCOUNTER — Encounter: Payer: Self-pay | Admitting: Nurse Practitioner

## 2021-08-19 ENCOUNTER — Other Ambulatory Visit: Payer: Self-pay

## 2021-08-19 VITALS — BP 153/86 | HR 93 | Temp 97.6°F | Resp 18 | Wt 122.8 lb

## 2021-08-19 DIAGNOSIS — Z7901 Long term (current) use of anticoagulants: Secondary | ICD-10-CM | POA: Diagnosis not present

## 2021-08-19 DIAGNOSIS — Z86718 Personal history of other venous thrombosis and embolism: Secondary | ICD-10-CM | POA: Diagnosis not present

## 2021-08-19 DIAGNOSIS — C799 Secondary malignant neoplasm of unspecified site: Secondary | ICD-10-CM

## 2021-08-19 DIAGNOSIS — Z5112 Encounter for antineoplastic immunotherapy: Secondary | ICD-10-CM | POA: Diagnosis not present

## 2021-08-19 DIAGNOSIS — C181 Malignant neoplasm of appendix: Secondary | ICD-10-CM | POA: Diagnosis not present

## 2021-08-19 DIAGNOSIS — I10 Essential (primary) hypertension: Secondary | ICD-10-CM | POA: Diagnosis not present

## 2021-08-19 DIAGNOSIS — C20 Malignant neoplasm of rectum: Secondary | ICD-10-CM

## 2021-08-19 DIAGNOSIS — Z79899 Other long term (current) drug therapy: Secondary | ICD-10-CM | POA: Diagnosis not present

## 2021-08-19 DIAGNOSIS — Z5111 Encounter for antineoplastic chemotherapy: Secondary | ICD-10-CM | POA: Diagnosis not present

## 2021-08-19 LAB — CBC WITH DIFFERENTIAL (CANCER CENTER ONLY)
Abs Immature Granulocytes: 0.01 10*3/uL (ref 0.00–0.07)
Basophils Absolute: 0 10*3/uL (ref 0.0–0.1)
Basophils Relative: 1 %
Eosinophils Absolute: 0 10*3/uL (ref 0.0–0.5)
Eosinophils Relative: 1 %
HCT: 35.9 % — ABNORMAL LOW (ref 36.0–46.0)
Hemoglobin: 12.3 g/dL (ref 12.0–15.0)
Immature Granulocytes: 0 %
Lymphocytes Relative: 20 %
Lymphs Abs: 1.2 10*3/uL (ref 0.7–4.0)
MCH: 34.1 pg — ABNORMAL HIGH (ref 26.0–34.0)
MCHC: 34.3 g/dL (ref 30.0–36.0)
MCV: 99.4 fL (ref 80.0–100.0)
Monocytes Absolute: 0.6 10*3/uL (ref 0.1–1.0)
Monocytes Relative: 11 %
Neutro Abs: 4 10*3/uL (ref 1.7–7.7)
Neutrophils Relative %: 67 %
Platelet Count: 247 10*3/uL (ref 150–400)
RBC: 3.61 MIL/uL — ABNORMAL LOW (ref 3.87–5.11)
RDW: 15.6 % — ABNORMAL HIGH (ref 11.5–15.5)
WBC Count: 5.9 10*3/uL (ref 4.0–10.5)
nRBC: 0 % (ref 0.0–0.2)

## 2021-08-19 LAB — CMP (CANCER CENTER ONLY)
ALT: 11 U/L (ref 0–44)
AST: 22 U/L (ref 15–41)
Albumin: 3.5 g/dL (ref 3.5–5.0)
Alkaline Phosphatase: 93 U/L (ref 38–126)
Anion gap: 12 (ref 5–15)
BUN: 17 mg/dL (ref 8–23)
CO2: 23 mmol/L (ref 22–32)
Calcium: 9.4 mg/dL (ref 8.9–10.3)
Chloride: 106 mmol/L (ref 98–111)
Creatinine: 0.81 mg/dL (ref 0.44–1.00)
GFR, Estimated: >60 mL/min (ref >60)
Glucose, Bld: 104 mg/dL — ABNORMAL HIGH (ref 70–99)
Potassium: 3.7 mmol/L (ref 3.5–5.1)
Sodium: 141 mmol/L (ref 135–145)
Total Bilirubin: 0.4 mg/dL (ref 0.3–1.2)
Total Protein: 6.8 g/dL (ref 6.5–8.1)

## 2021-08-19 LAB — CEA (IN HOUSE-CHCC): CEA (CHCC-In House): 3.43 ng/mL (ref 0.00–5.00)

## 2021-08-19 LAB — TOTAL PROTEIN, URINE DIPSTICK: Protein, ur: 30 mg/dL — AB

## 2021-08-19 MED ORDER — OXALIPLATIN CHEMO INJECTION 100 MG/20ML
70.0000 mg/m2 | Freq: Once | INTRAVENOUS | Status: AC
  Administered 2021-08-19: 115 mg via INTRAVENOUS
  Filled 2021-08-19: qty 20

## 2021-08-19 MED ORDER — SODIUM CHLORIDE 0.9% FLUSH
10.0000 mL | INTRAVENOUS | Status: DC | PRN
  Administered 2021-08-19: 10 mL

## 2021-08-19 MED ORDER — HEPARIN SOD (PORK) LOCK FLUSH 100 UNIT/ML IV SOLN
500.0000 [IU] | Freq: Once | INTRAVENOUS | Status: AC | PRN
  Administered 2021-08-19: 500 [IU]

## 2021-08-19 MED ORDER — PALONOSETRON HCL INJECTION 0.25 MG/5ML
0.2500 mg | Freq: Once | INTRAVENOUS | Status: AC
  Administered 2021-08-19: 0.25 mg via INTRAVENOUS
  Filled 2021-08-19: qty 5

## 2021-08-19 MED ORDER — SODIUM CHLORIDE 0.9 % IV SOLN
10.0000 mg | Freq: Once | INTRAVENOUS | Status: AC
  Administered 2021-08-19: 10 mg via INTRAVENOUS
  Filled 2021-08-19: qty 10

## 2021-08-19 MED ORDER — DEXTROSE 5 % IV SOLN: Freq: Once | INTRAVENOUS | Status: AC

## 2021-08-19 MED ORDER — PROCHLORPERAZINE MALEATE 10 MG PO TABS: 10.0000 mg | ORAL_TABLET | Freq: Four times a day (QID) | ORAL | 2 refills | Status: DC | PRN

## 2021-08-19 MED ORDER — SODIUM CHLORIDE 0.9% FLUSH
10.0000 mL | Freq: Once | INTRAVENOUS | Status: AC
  Administered 2021-08-19: 10 mL

## 2021-08-19 NOTE — Progress Notes (Signed)
Per Cira Rue, NP no bevacizumab today only oxaliplatin.

## 2021-08-19 NOTE — Patient Instructions (Signed)
[facility]   ?Discharge Instructions: ?Thank you for choosing [facility] to provide your oncology and hematology care.  ? ?If you have a lab appointment with the Cancer Center, please go directly to the Cancer Center and check in at the registration area. ?  ?Wear comfortable clothing and clothing appropriate for easy access to any Portacath or PICC line.  ? ?We strive to give you quality time with your provider. You may need to reschedule your appointment if you arrive late (15 or more minutes).  Arriving late affects you and other patients whose appointments are after yours.  Also, if you miss three or more appointments without notifying the office, you may be dismissed from the clinic at the provider?s discretion.    ?  ?For prescription refill requests, have your pharmacy contact our office and allow 72 hours for refills to be completed.   ? ?Today you received the following chemotherapy and/or immunotherapy agents: oxaliplatin    ?  ?To help prevent nausea and vomiting after your treatment, we encourage you to take your nausea medication as directed. ? ?BELOW ARE SYMPTOMS THAT SHOULD BE REPORTED IMMEDIATELY: ?*FEVER GREATER THAN 100.4 F (38 ?C) OR HIGHER ?*CHILLS OR SWEATING ?*NAUSEA AND VOMITING THAT IS NOT CONTROLLED WITH YOUR NAUSEA MEDICATION ?*UNUSUAL SHORTNESS OF BREATH ?*UNUSUAL BRUISING OR BLEEDING ?*URINARY PROBLEMS (pain or burning when urinating, or frequent urination) ?*BOWEL PROBLEMS (unusual diarrhea, constipation, pain near the anus) ?TENDERNESS IN MOUTH AND THROAT WITH OR WITHOUT PRESENCE OF ULCERS (sore throat, sores in mouth, or a toothache) ?UNUSUAL RASH, SWELLING OR PAIN  ?UNUSUAL VAGINAL DISCHARGE OR ITCHING  ? ?Items with * indicate a potential emergency and should be followed up as soon as possible or go to the Emergency Department if any problems should occur. ? ?Please show the CHEMOTHERAPY ALERT CARD or IMMUNOTHERAPY ALERT CARD at check-in  to the Emergency Department and triage nurse. ? ?Should you have questions after your visit or need to cancel or reschedule your appointment, please contact [facility]  Dept: [phone]  and follow the prompts.  Office hours are 8:00 a.m. to 4:30 p.m. Monday - Friday. Please note that voicemails left after 4:00 p.m. may not be returned until the following business day.  We are closed weekends and major holidays. You have access to a nurse at all times for urgent questions. Please call the main number to the clinic Dept: [phone] and follow the prompts. ? ? ?For any non-urgent questions, you may also contact your provider using MyChart. We now offer e-Visits for anyone 18 and older to request care online for non-urgent symptoms. For details visit [email or web address]. ?  ?Also download the MyChart app! Go to the app store, search "MyChart", open the app, select , and log in with your MyChart username and password. ? ?Due to Covid, a mask is required upon entering the hospital/clinic. If you do not have a mask, one will be given to you upon arrival. For doctor visits, patients may have 1 support person aged 18 or older with them. For treatment visits, patients cannot have anyone with them due to current Covid guidelines and our immunocompromised population.  ? ?

## 2021-08-20 ENCOUNTER — Telehealth: Payer: Self-pay | Admitting: Hematology

## 2021-08-20 ENCOUNTER — Telehealth: Payer: Self-pay | Admitting: Pulmonary Disease

## 2021-08-20 DIAGNOSIS — M6281 Muscle weakness (generalized): Secondary | ICD-10-CM | POA: Diagnosis not present

## 2021-08-20 DIAGNOSIS — R69 Illness, unspecified: Secondary | ICD-10-CM | POA: Diagnosis not present

## 2021-08-20 DIAGNOSIS — J90 Pleural effusion, not elsewhere classified: Secondary | ICD-10-CM | POA: Diagnosis not present

## 2021-08-20 DIAGNOSIS — I82621 Acute embolism and thrombosis of deep veins of right upper extremity: Secondary | ICD-10-CM | POA: Diagnosis not present

## 2021-08-20 DIAGNOSIS — J9600 Acute respiratory failure, unspecified whether with hypoxia or hypercapnia: Secondary | ICD-10-CM | POA: Diagnosis not present

## 2021-08-20 DIAGNOSIS — E43 Unspecified severe protein-calorie malnutrition: Secondary | ICD-10-CM | POA: Diagnosis not present

## 2021-08-20 DIAGNOSIS — C20 Malignant neoplasm of rectum: Secondary | ICD-10-CM | POA: Diagnosis not present

## 2021-08-20 DIAGNOSIS — R1310 Dysphagia, unspecified: Secondary | ICD-10-CM | POA: Diagnosis not present

## 2021-08-20 DIAGNOSIS — J449 Chronic obstructive pulmonary disease, unspecified: Secondary | ICD-10-CM | POA: Diagnosis not present

## 2021-08-20 MED ORDER — TRELEGY ELLIPTA 100-62.5-25 MCG/INH IN AEPB: 1.0000 | INHALATION_SPRAY | Freq: Every day | RESPIRATORY_TRACT | 0 refills | Status: DC

## 2021-08-20 NOTE — Telephone Encounter (Signed)
Scheduled follow-up appointments per 9/6 los. Patient is aware. 

## 2021-08-20 NOTE — Telephone Encounter (Signed)
Called and spoke with Patient.  Trelegy refill sent to requested [facility] pharmacy.  Patient has 1 year follow up scheduled 09/24/21, with Dr. Valeta Harms.  Nothing further at this time.

## 2021-08-29 MED FILL — Dexamethasone Sodium Phosphate Inj 100 MG/10ML: INTRAMUSCULAR | Qty: 1 | Status: AC

## 2021-09-01 ENCOUNTER — Other Ambulatory Visit: Payer: Self-pay

## 2021-09-01 ENCOUNTER — Inpatient Hospital Stay (HOSPITAL_BASED_OUTPATIENT_CLINIC_OR_DEPARTMENT_OTHER): Payer: Medicare HMO | Admitting: Hematology

## 2021-09-01 ENCOUNTER — Inpatient Hospital Stay: Payer: Medicare HMO

## 2021-09-01 VITALS — BP 144/93 | HR 96 | Resp 19 | Ht 62.0 in | Wt 120.3 lb

## 2021-09-01 DIAGNOSIS — C20 Malignant neoplasm of rectum: Secondary | ICD-10-CM

## 2021-09-01 DIAGNOSIS — C799 Secondary malignant neoplasm of unspecified site: Secondary | ICD-10-CM

## 2021-09-01 DIAGNOSIS — Z7901 Long term (current) use of anticoagulants: Secondary | ICD-10-CM | POA: Diagnosis not present

## 2021-09-01 DIAGNOSIS — I10 Essential (primary) hypertension: Secondary | ICD-10-CM | POA: Diagnosis not present

## 2021-09-01 DIAGNOSIS — Z5111 Encounter for antineoplastic chemotherapy: Secondary | ICD-10-CM | POA: Diagnosis not present

## 2021-09-01 DIAGNOSIS — Z86718 Personal history of other venous thrombosis and embolism: Secondary | ICD-10-CM | POA: Diagnosis not present

## 2021-09-01 DIAGNOSIS — C181 Malignant neoplasm of appendix: Secondary | ICD-10-CM | POA: Diagnosis not present

## 2021-09-01 DIAGNOSIS — Z5112 Encounter for antineoplastic immunotherapy: Secondary | ICD-10-CM | POA: Diagnosis not present

## 2021-09-01 DIAGNOSIS — Z79899 Other long term (current) drug therapy: Secondary | ICD-10-CM | POA: Diagnosis not present

## 2021-09-01 LAB — CMP (CANCER CENTER ONLY)
ALT: 15 U/L (ref 0–44)
AST: 25 U/L (ref 15–41)
Albumin: 3.8 g/dL (ref 3.5–5.0)
Alkaline Phosphatase: 106 U/L (ref 38–126)
Anion gap: 12 (ref 5–15)
BUN: 16 mg/dL (ref 8–23)
CO2: 23 mmol/L (ref 22–32)
Calcium: 9.3 mg/dL (ref 8.9–10.3)
Chloride: 103 mmol/L (ref 98–111)
Creatinine: 0.85 mg/dL (ref 0.44–1.00)
GFR, Estimated: >60 mL/min (ref >60)
Glucose, Bld: 112 mg/dL — ABNORMAL HIGH (ref 70–99)
Potassium: 3.7 mmol/L (ref 3.5–5.1)
Sodium: 138 mmol/L (ref 135–145)
Total Bilirubin: 0.5 mg/dL (ref 0.3–1.2)
Total Protein: 7 g/dL (ref 6.5–8.1)

## 2021-09-01 LAB — CBC WITH DIFFERENTIAL (CANCER CENTER ONLY)
Abs Immature Granulocytes: 0.02 10*3/uL (ref 0.00–0.07)
Basophils Absolute: 0 10*3/uL (ref 0.0–0.1)
Basophils Relative: 0 %
Eosinophils Absolute: 0.1 10*3/uL (ref 0.0–0.5)
Eosinophils Relative: 1 %
HCT: 38.3 % (ref 36.0–46.0)
Hemoglobin: 12.7 g/dL (ref 12.0–15.0)
Immature Granulocytes: 0 %
Lymphocytes Relative: 28 %
Lymphs Abs: 2.3 10*3/uL (ref 0.7–4.0)
MCH: 33.4 pg (ref 26.0–34.0)
MCHC: 33.2 g/dL (ref 30.0–36.0)
MCV: 100.8 fL — ABNORMAL HIGH (ref 80.0–100.0)
Monocytes Absolute: 1.1 10*3/uL — ABNORMAL HIGH (ref 0.1–1.0)
Monocytes Relative: 14 %
Neutro Abs: 4.5 10*3/uL (ref 1.7–7.7)
Neutrophils Relative %: 57 %
Platelet Count: 233 10*3/uL (ref 150–400)
RBC: 3.8 MIL/uL — ABNORMAL LOW (ref 3.87–5.11)
RDW: 15.8 % — ABNORMAL HIGH (ref 11.5–15.5)
WBC Count: 8.1 10*3/uL (ref 4.0–10.5)
nRBC: 0 % (ref 0.0–0.2)

## 2021-09-01 LAB — TOTAL PROTEIN, URINE DIPSTICK: Protein, ur: 30 mg/dL — AB

## 2021-09-01 MED ORDER — SODIUM CHLORIDE 0.9% FLUSH
10.0000 mL | INTRAVENOUS | Status: DC | PRN
  Administered 2021-09-01: 10 mL

## 2021-09-01 MED ORDER — SODIUM CHLORIDE 0.9 % IV SOLN
5.0000 mg/kg | Freq: Once | INTRAVENOUS | Status: AC
  Administered 2021-09-01: 300 mg via INTRAVENOUS
  Filled 2021-09-01: qty 12

## 2021-09-01 MED ORDER — HEPARIN SOD (PORK) LOCK FLUSH 100 UNIT/ML IV SOLN
500.0000 [IU] | Freq: Once | INTRAVENOUS | Status: AC | PRN
  Administered 2021-09-01: 500 [IU]

## 2021-09-01 MED ORDER — SODIUM CHLORIDE 0.9 % IV SOLN: Freq: Once | INTRAVENOUS | Status: AC

## 2021-09-01 MED ORDER — SODIUM CHLORIDE 0.9% FLUSH
10.0000 mL | Freq: Once | INTRAVENOUS | Status: AC
  Administered 2021-09-01: 10 mL

## 2021-09-01 NOTE — Patient Instructions (Signed)
[facility]  Discharge Instructions: Thank you for choosing [facility] to provide your oncology and hematology care.   If you have a lab appointment with the Cancer Center, please go directly to the Cancer Center and check in at the registration area.   Wear comfortable clothing and clothing appropriate for easy access to any Portacath or PICC line.   We strive to give you quality time with your provider. You may need to reschedule your appointment if you arrive late (15 or more minutes).  Arriving late affects you and other patients whose appointments are after yours.  Also, if you miss three or more appointments without notifying the office, you may be dismissed from the clinic at the provider's discretion.      For prescription refill requests, have your pharmacy contact our office and allow 72 hours for refills to be completed.    Today you received the following chemotherapy and/or immunotherapy agents [id number] (Zirabev).      To help prevent nausea and vomiting after your treatment, we encourage you to take your nausea medication as directed.  BELOW ARE SYMPTOMS THAT SHOULD BE REPORTED IMMEDIATELY: *FEVER GREATER THAN 100.4 F (38 C) OR HIGHER *CHILLS OR SWEATING *NAUSEA AND VOMITING THAT IS NOT CONTROLLED WITH YOUR NAUSEA MEDICATION *UNUSUAL SHORTNESS OF BREATH *UNUSUAL BRUISING OR BLEEDING *URINARY PROBLEMS (pain or burning when urinating, or frequent urination) *BOWEL PROBLEMS (unusual diarrhea, constipation, pain near the anus) TENDERNESS IN MOUTH AND THROAT WITH OR WITHOUT PRESENCE OF ULCERS (sore throat, sores in mouth, or a toothache) UNUSUAL RASH, SWELLING OR PAIN  UNUSUAL VAGINAL DISCHARGE OR ITCHING   Items with * indicate a potential emergency and should be followed up as soon as possible or go to the Emergency Department if any problems should occur.  Please show the CHEMOTHERAPY ALERT CARD or IMMUNOTHERAPY ALERT  CARD at check-in to the Emergency Department and triage nurse.  Should you have questions after your visit or need to cancel or reschedule your appointment, please contact [facility]  Dept: [phone]  and follow the prompts.  Office hours are 8:00 a.m. to 4:30 p.m. Monday - Friday. Please note that voicemails left after 4:00 p.m. may not be returned until the following business day.  We are closed weekends and major holidays. You have access to a nurse at all times for urgent questions. Please call the main number to the clinic Dept: [phone] and follow the prompts.   For any non-urgent questions, you may also contact your provider using MyChart. We now offer e-Visits for anyone 18 and older to request care online for non-urgent symptoms. For details visit [email or web address].   Also download the MyChart app! Go to the app store, search "MyChart", open the app, select [facility], and log in with your MyChart username and password.  Due to Covid, a mask is required upon entering the hospital/clinic. If you do not have a mask, one will be given to you upon arrival. For doctor visits, patients may have 1 support person aged 18 or older with them. For treatment visits, patients cannot have anyone with them due to current Covid guidelines and our immunocompromised population.   

## 2021-09-01 NOTE — Progress Notes (Addendum)
[place]   [phone] [phone]) [phone]   Clinic Follow up Note   Patient Care Team: [name], DO as PCP - General (Family Medicine) [name], MD as Consulting Physician (Hematology) Icard, Octavio Graves, DO as Consulting Physician (Pulmonary Disease) Nelson, P.A. [name], MD as Consulting Physician (General Surgery) [name], MD as Consulting Physician (General Surgery) [name], [name], MD as Consulting Physician (Gastroenterology)  Date of Service:  09/01/2021  CHIEF COMPLAINT: f/u of rectal [name]  CURRENT THERAPY:  First-line chemo Xeloda 1500 mg twice daily for day 1-14, every 21 days, started on 02/01/2021. Reduced to 1 week on/ 1 week off and added bevacizumab q2weeks from C2 on 02/24/21. Given good tolerance, changed to CAPOX and bevacizumab q2weeks from C4 on 03/24/21 with Xeloda 1582m in the AM and 10053min the PM 1 week on/1 week off.  ASSESSMENT & PLAN:  Tracy [name] a [age].o. female with   1. Tracy [name] pT4aN1b stage III, MMR normal, peritoneal metastasis in 01/2021 -diagnosed 02/2020, due to perforation underwent emergent surgery. Path showed invasive adenocarcinoma with perforation, +3/15 LNs, and clear margins. Initial CT scan negative for distant metastasis. -due to bowel perforation and + LNs she has very high risk for recurrence, unfortunately she was not a candidate for adjuvant chemo due to very slow recovery from surgery -Guardant Reveal ctDNA were all negative x3 (06/06/20, 07/12/20, and 09/10/20). -surveillance CT 08/27/20 showed a small density adjacent to the spleen that had decreased, no other evidence of recurrent or metastatic disease -first surveillance colonoscopy by Dr. ArHavery Moros/24/22 showed new adenocarcinoma at appendiceal orifice and other tubular adenomas throughout the colon.  Biopsy confirmed adenocarcinoma -01/27/21 PET showed tumor in appendiceal  orifice is likely peritoneal metastasis growing into the cecum.  Her peritoneal implants are mainly in the pelvis, difficult to biopsy. -She began single agent Xeloda with cycle 1 on 02/01/21.  Due to low performance status with cycle 2 was changed to 1 week on/1 week off, and beva was added. She tolerates this well -PS improved and low dose oxaliplatin was added with C4, now on CAPOX/beva q2 weeks, with Xeloda 1 week on/1 week off. -PET scan 05/08/21 showed good response to peritoneal metastasis, with the development of several FDG avid pulmonary nodules, and hypermetabolic left paratracheal node, the differential includes infectious/inflammatory versus thoracic metastasis.  -Chemo held briefly due to COVID-19 and respiratory infection -restaging CT CAP on 08/14/21 showed resolution of pulmonary nodules, stable mediastinal node, no clear radiographic metastatic disease. -She has developed worsening cold sensitivity and fatigue, which has not resolved since last cycle.  We will hold oxaliplatin today, and likely reduced dose in future -We will proceed bevacizumab today and continue Xeloda.  2. Bilateral PE, found on restaging CT CAP 08/14/21  -no hypoxia, stable respiratory status  -no need for inpatient management, she began xarelto 08/15/21 -hold Beva for 1-2 months given recent PE   3. COVID-19+ 05/21/21 and respiratory infection -she received CAPOX/beva on 05/19/21, tested positive on 05/21/21 -treated with paxlovid and subsequently doxycycline, augmentin, and prednisone -chest x-ray 06/23/21 showed only emphysema -resolved   4. H/o provoked RUE DVT 04/04/20 s/p 3 months AC (Eliquis)   5. Comorbidities: Asthma, COPD, anxiety -on nebulizer and trelegy, has not needed rescue inhaler recently -f/u with pulmonology and PCP     PLAN: -Due to her persistent cold sensitivity and fatigue, will hold on oxaliplatin today, and proceed bevacizumab.  -She will continue  Xeloda -labs, f/u, and CAPOX/beva as  scheduled in 2 and 4 weeks.   No problem-specific Assessment & Plan notes found for this encounter.   SUMMARY OF ONCOLOGIC HISTORY: Oncology History Overview Note  [name] Staging Rectal [name] [facility]) Staging form: Colon and Rectum, AJCC 8th Edition - Pathologic stage from 03/03/2020: Stage IIIB (pT4a, pN1b, cM0) - Signed by [name], MD on 01/28/2021 Stage prefix: Initial diagnosis Histologic grading system: 4 grade system Histologic grade (G): G2 Residual tumor (R): R0 - None    Rectal [name] ([place])  03/03/2020 - 04/05/2020 Hospital Admission   She was admitted to ED on 03/03/20 for abdominal pain and nausea. She had been having diarrhea for several weeks. During hospital stay she developed acute respiratory failure, AKI, RUE DVT from PICC line. Work up showed bowel perforation, small left liver mass and thickening of jejunum. She underwent emergent surgery on 03/03/20 for resection and colostomy placement. Her path showed invasive [name], metastatic to 3/15 LNs. She had a NGT in placed but this was removed on POD 3. Post op her stoma became necrotic and septic. She had another bowel surgery on 03/12/20, which was NED with necrotic tissue.    03/03/2020 Imaging   CT AP 03/03/20  IMPRESSION: 1. Free intraperitoneal air consistent perforation. Most likely site of perforation is in the distal splenic flexure/proximal descending colon where there is a collection of air and gas measuring 6 centimeters. No obvious soft tissue mass identified in this region. At this site, there is abrupt transition of dilated, stool-filled colon to completely decompressed proximal descending colon. 2. Thickened, inflamed loops of jejunum are identified within the pelvis and are likely reactive. 3. Small hiatal hernia. 4. Benign-appearing 1.6 centimeter mass the LEFT hepatic lobe. Recommend comparison with prior studies if available. 5.  Emphysema (ICD10-J43.9). 6.  Aortic Atherosclerosis (ICD10-I70.0). 7.  Bilateral renal scarring.   03/03/2020 Surgery   low anterior resection end colostomy by Dr Marcello Moores    03/03/2020 Initial Biopsy   FINAL MICROSCOPIC DIAGNOSIS: 03/03/20 A. RECTOSIGMOID COLON, LOW ANTERIOR RESECTION:  - Invasive colonic adenocarcinoma, 3.5 cm.  - Tumor invades the visceral peritoneum.  - Margins of resection are not involved.  - Metastatic carcinoma in (3) of (15) lymph nodes.  - See oncology table.   B. ADDITIONAL SIGMOID COLON, RESECTION:  - Colonic tissue, negative for carcinoma.  ADDENDUM:  Mismatch Repair Protein (IHC)   SUMMARY INTERPRETATION: NORMAL  There is preserved expression of the major MMR proteins. There is a very  low probability that microsatellite instability (MSI) is present.  However, certain clinically significant MMR protein mutations may result  in preservation of nuclear expression. It is recommended that the  preservation of protein expression be correlated with molecular based  MSI testing.   IHC EXPRESSION RESULTS  TEST           RESULT  MLH1:          Preserved nuclear expression  MSH2:          Preserved nuclear expression  MSH6:          Preserved nuclear expression  PMS2:          Preserved nuclear expression   03/03/2020 [name] Staging   Staging form: Colon and Rectum, AJCC 8th Edition - Pathologic stage from 03/03/2020: Stage IIIB (pT4a, pN1b, cM0) - Signed by [name], MD on 01/28/2021 Stage prefix: Initial diagnosis Histologic grading system: 4 grade system Histologic grade (G): G2 Residual tumor (R): R0 - None  03/08/2020 Imaging   CT AP 03/08/20 IMPRESSION: 1. Interval midline laparotomy with distal colon resection and diverting left lower quadrant colostomy. 2. Diffuse small bowel dilatation with gas fluid levels most consistent with postoperative ileus. 3. Trace ascites within the abdomen and pelvis. No fluid collection or abscess at this time. Surgical drain within the lower pelvis. 4. Indeterminate 1.4 cm  subcapsular liver hypodensity. In light of newly diagnosed rectal [name], metastatic disease cannot be excluded. PET CT may be useful for further evaluation. 5. Interval development of trace bilateral pleural effusions and diffuse body wall edema.   03/12/2020 Surgery   EXPLORATORY LAPAROTOMY WITH BOWEL RESECTION AND COLOSTOMY by Dr Hassell Done 03/12/20  FINAL MICROSCOPIC DIAGNOSIS: 03/12/20  A. COLON, SPLENIC FLEXURE, RESECTION:  - Segment of colon (37 cm) with perforation and associated inflammation  - Multiple mucosal ulcers with necrotizing inflammation  - No evidence of malignancy    03/12/2020 Imaging   CT AP WO contrast 03/12/20  IMPRESSION: 1. Exam is limited by lack of intravenous contrast, motion, and artifact from the patient's arms adjacent to the torso. Since 03/08/2020, there has been development of relatively large pockets of intraperitoneal free air. While intraperitoneal gas would not be unexpected on postoperative day 9, this gas is new since an intervening study of 03/08/2020 and given the relatively large volume certainly raises concern for bowel perforation. No source for the intraperitoneal free air is evident on this study. There is a surgical drain in the pelvis, but there is no free gas around the drain itself to suggest that it represents the source. 2. New circumferential wall thickening in the splenic flexure, descending colon and sigmoid colon leading into the end colostomy. Infection/inflammation would be a consideration. Ischemia cannot be excluded. 3. Relatively small volume intraperitoneal free fluid. High attenuation small fluid collections in the left upper abdomen may reflect hemorrhage, infection or residua from prior perforation. 4. Interval progression of diffuse body wall edema. 5. Residual contrast material in the renal parenchyma from prior imaging, compatible with renal dysfunction.    03/19/2020 Imaging   CT CAP 03/19/20  IMPRESSION: 1. 5.5 x  3.2 cm fluid collection is noted along the greater curvature of the proximal stomach. 2. Surgical drain is again noted in the pelvis with tip in left lower quadrant. 3. Interval development of crescent-shaped fluid collection measuring 16.4 x 3.3 cm in the epigastric region and left upper quadrant of the abdomen which may extend into the left lower quadrant. Potentially this may represent abscess or developing abscess. Multiple other smaller fluid collections are noted which may represent small abscesses. 4. 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 5. Mild amount of free fluid is noted in the posterior pelvis. 6. Moderate anasarca is noted. Aortic Atherosclerosis (ICD10-I70.0).   04/12/2020 Imaging   CT AP 04/12/20  IMPRESSION: 1. Fluid collections within the LEFT abdomen have significantly decreased compared to previous CT exams, now nearly completely resolved. 2. Percutaneous drainage catheter with tip coiled posterior to the LEFT kidney, stable positioning compared to the previous study. The more anterior catheter has been removed. 3. Small amount of free fluid persists within the abdomen and pelvis. 4. Trace bibasilar pleural effusions. 5. Anasarca. 6. While reviewing today's study, comparing with a chest/abdomen/pelvis CT from earlier same month, there is question of thrombus in the LEFT internal jugular vein. Recommend ultrasound of the LEFT IJ to exclude DVT. This recommendation discussed with patient's hospitalist, Dr. Owens Shark, on 04/12/2020 at 4:20 p.m.   Emphysema (ICD10-J43.9).  04/22/2020 Imaging   CT AP 04/22/20  IMPRESSION: 1. No recurrent intra-abdominal abscess status post interval removal of left retroperitoneal percutaneous drain. Stable small amount of free pelvic fluid. 2. Enlarging dependent bilateral pleural effusions, now moderate in volume. Associated atelectasis at both lung bases. 3. Progressive anasarca with generalized edema throughout  the subcutaneous fat. 4. Stable small subcapsular fluid collection along the anterior aspect of the left hepatic lobe. 5. Aortic Atherosclerosis (ICD10-I70.0).   05/23/2020 Initial Diagnosis   Rectal [name] ([place])   01/14/2021 Imaging   CT CAP IMPRESSION: -5.0 cm soft tissue mass along the posterior aspect of the cecum at the base of the appendix, favored to reflect a peritoneal/serosal implant, corresponding to the patient's known adenocarcinoma. This is new from the prior. -Suspected additional pelvic implants along the uterus and right adnexa, poorly visualized, new/progressive from the prior. -Additional pericapsular lesion along the lateral spleen is mildly progressive, suspicious for additional peritoneal implant. -No evidence of metastatic disease in the chest.   01/27/2021 PET scan   IMPRESSION:  1. Extensive hypermetabolic peritoneal metastasis, as detailed  above.  2. No evidence of hypermetabolic supradiaphragmatic disease.  3. Diffuse low-level thyroid hypermetabolism can be seen in the  setting of thyroiditis. Consider correlation with thyroid function  labs.  4. Aortic atherosclerosis (ICD10-I70.0) and emphysema (ICD10-J43.9).    02/01/2021 -  Chemotherapy    First-line chemo Xeloda 1500 mg twice daily for day 1-14, every 21 days, started on 02/01/2021. Reduced to 1 week on/ 1 week off and added bevacizumab q2weeks from C2 on 02/24/21.  ----Given good tolerance, I changed to CAPOX and bevacizumab q2weeks from C4 on 03/24/21 with Xeloda 1558m in the AM and 10014min the PM 1 week on/1 week off.     05/08/2021 Imaging   PET  IMPRESSION: 1. There is been interval development of several FDG avid pulmonary nodules within both lungs. Additionally, there is new FDG avid low left paratracheal lymph node. Thoracic metastasis cannot be excluded. 2. Interval improvement in multifocal FDG avid peritoneal metastasis as detailed above.     08/14/2021 Imaging   CT  CAP  IMPRESSION: Resolution of previously seen small bilateral pulmonary nodules since previous study.   Stable 8 mm mediastinal lymph node in the AP window.   No evidence of metastatic disease within the abdomen or pelvis.   Nonocclusive pulmonary embolism in bilateral central lower lobe pulmonary arteries, which appears subacute in age.   Malignant neoplasm of appendix ([facility] 01/06/2021 Procedure   (surveillance colonoscopy for h/o rectal [name]) Impression:  - Preparation of the colon was fair, lavage performed with mostly adequate views. - Polypoid lesion obliterating appendiceal orifice. Biopsied to evaluate for malignancy. - The examined portion of the ileum was normal. - Two large polyps in the cecum, not removed today pending path at appendiceal orifice, bowel prep. If removed endoscopically in the future would favor EMR to be done at the hospital. - One 5 mm polyp at the ileocecal valve, removed with a cold snare. Resected and retrieved. - One 3 mm polyp in the transverse colon, removed with a cold snare. Resected and retrieved. - Parastomal hernia making initial entry to the distal colon challenging. - The examination was otherwise normal.   01/06/2021 Initial Biopsy   Diagnosis 1. Colon, biopsy, appendiceal oraface - ADENOCARCINOMA. 2. Colon, polyp(s), ileocecal valve, transverse, x2 - TUBULAR ADENOMA, NEGATIVE FOR HIGH GRADE DYSPLASIA (X1). - COLONIC MUCOSA WITH UNDERLYING LYMPHOID AGGREGATE, NEGATIVE FOR DYSPLASIA (X1).  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 This is new from the prior. -Suspected additional pelvic implants along the uterus and right adnexa, poorly visualized,  new/progressive from the prior. -Additional pericapsular lesion along the lateral spleen is mildly progressive, suspicious for additional peritoneal implant. -No evidence of metastatic disease in the chest.   08/14/2021 Imaging   CT CAP IMPRESSION: Resolution of previously seen small bilateral pulmonary nodules since previous study. Stable 8 mm mediastinal lymph node in the AP window. No evidence of metastatic disease within the abdomen or pelvis. Nonocclusive pulmonary embolism in bilateral central lower lobe pulmonary arteries, which appears subacute in age.  Aortic Atherosclerosis (ICD10-I70.0) and Emphysema (ICD10-J43.9).     08/14/2021 Imaging   CT CAP  IMPRESSION: Resolution of previously seen small bilateral pulmonary nodules since previous study.   Stable 8 mm mediastinal lymph node in the AP window.   No evidence of metastatic disease within the abdomen or pelvis.   Nonocclusive pulmonary embolism in bilateral central lower lobe pulmonary arteries, which appears subacute in age.      INTERVAL HISTORY:  Tracy [name] is here for a follow up of rectal [name]. She was last seen by me on 08/04/21 and by NP Lacie in the interim. She presents to the clinic alone. She reports continued cold sensitivity, it has been lasting longer, it has not resolved completely so far.  She is also more fatigued, but is still able to function and do her ADLs at home.  No other new complaints.   All other systems were reviewed with the patient and are negative.  MEDICAL HISTORY:  Past Medical History:  Diagnosis Date   Acute deep vein thrombosis (DVT) of brachial vein of right upper extremity (HCC)    Acute on chronic respiratory failure with hypoxia (HCC)    Asthma    Cataract    CHICKENPOX, HX OF 01/06/2011   Qualifier: Diagnosis of  By: Charlett Blake MD, Stacey     COPD (chronic obstructive pulmonary disease) ([place]) 2011   FeV1 31% predicted FeV1/FVX 47 %   COVID-19 05/2021   Essential  hypertension 05/23/2020   Hemorrhoid    Open right radial fracture 2020   Osteopenia    Tracy sigmoid colon ([place]) 03/03/2020   Pleural effusion    Pneumonia 2021   Postoperative intra-abdominal abscess 2021   Rectal [name] (HCC)    Seasonal allergies    takes Claritin daily prn   Shock circulatory ([place])    Tracheostomy status ([place])    Vitamin D deficiency    takes Vit d every 14 days    SURGICAL HISTORY: Past Surgical History:  Procedure Laterality Date   AUGMENTATION MAMMAPLASTY     saline   EXAMINATION UNDER ANESTHESIA  10/14/2012   Procedure: EXAM UNDER ANESTHESIA;  Surgeon: Gayland Curry, MD,FACS;  Location: [place];  Service: General;  Laterality: N/A;  rectal exam under anesthesia excisional hemorrhoidectomy hemorrhoidal banding x two   EXAMINATION UNDER ANESTHESIA  02/03/2013   excision hemorrhoidal tissue   FOOT SURGERY     left bunionectomy   HEMORRHOIDECTOMY WITH HEMORRHOID BANDING  10/14/2012   Procedure: HEMORRHOIDECTOMY WITH HEMORRHOID BANDING;  Surgeon: Gayland Curry, MD,FACS;  Location: [place];  Service: General;  Laterality: N/A;  IR IMAGING GUIDED PORT INSERTION  02/03/2021   IR THORACENTESIS ASP PLEURAL SPACE W/IMG GUIDE  04/23/2020   LAPAROTOMY N/A 03/03/2020   Procedure: low anterior resection end colostomy;  Surgeon: [name], MD;  Location: [facility];  Service: General;  Laterality: N/A;   LAPAROTOMY N/A 03/12/2020   Procedure: EXPLORATORY LAPAROTOMY WITH BOWEL RESECTION AND COLOSTOMY;  Surgeon: [name], MD;  Location: [facility];  Service: General;  Laterality: N/A;   OPEN REDUCTION INTERNAL FIXATION (ORIF) DISTAL RADIAL FRACTURE Right 11/01/2018   Procedure: OPEN REDUCTION INTERNAL FIXATION (ORIF) DISTAL RADIAL FRACTURE;  Surgeon: [name], MD;  Location: [place];  Service: Orthopedics;  Laterality: Right;   TONSILLECTOMY  1960   recurrent otitis media   US ECHOCARDIOGRAPHY  02/2020    poor windows, normal LV function, severely  dilated RV with moderately reduced function, RV volume and pressure overload, mildly dilated RA    I have reviewed the social history and family history with the patient and they are unchanged from previous note.  ALLERGIES:  is allergic to amlodipine and codeine.  MEDICATIONS:  Current Outpatient Medications  Medication Sig Dispense Refill   albuterol (PROVENTIL) (2.5 MG/3ML) 0.083% nebulizer solution Take 3 mLs (2.5 mg total) by nebulization every 4 (four) hours as needed for wheezing or shortness of breath. DX: J44.9 360 mL 5   albuterol (VENTOLIN HFA) 108 (90 Base) MCG/ACT inhaler Inhale 2 puffs into the lungs every 6 (six) hours as needed for wheezing or shortness of breath. 8 g 5   ALPRAZolam (XANAX) 0.25 MG tablet Take 1 tablet (0.25 mg total) by mouth at bedtime as needed for anxiety. 30 tablet 0   capecitabine (XELODA) 500 MG tablet Take 3 tablets (1,500 mg total) by mouth 2 (two) times daily after a meal. Take for 7 days on, 7 days off, repeat every 14 days. 84 tablet 1   diphenhydrAMINE (BENADRYL) 25 mg capsule Take 25 mg by mouth every 6 (six) hours as needed.     docusate sodium (COLACE) 50 MG capsule Take 50 mg by mouth daily as needed for mild constipation.     fluticasone (FLONASE) 50 MCG/ACT nasal spray Place 2 sprays into both nostrils daily. 16 mL 5   Fluticasone-Umeclidin-Vilant (TRELEGY ELLIPTA) 100-62.5-25 MCG/INH AEPB Inhale 1 puff into the lungs daily. 60 each 0   HYDROcodone bit-homatropine (HYDROMET) 5-1.5 MG/5ML syrup Take 5 mLs by mouth every 6 (six) hours as needed for cough. 120 mL 0   lidocaine-prilocaine (EMLA) cream Apply 1 application topically as needed. 30 g 0   ondansetron (ZOFRAN) 8 MG tablet Take 1 tablet (8 mg total) by mouth every 8 (eight) hours as needed for nausea or vomiting. 20 tablet 2   prochlorperazine (COMPAZINE) 10 MG tablet Take 1 tablet (10 mg total) by mouth every 6 (six) hours as needed for nausea or vomiting. 30 tablet 2   RIVAROXABAN  (XARELTO) VTE STARTER PACK (15 & 20 MG) Follow package directions: Take one 79m tablet by mouth twice a day. On day 22, switch to one 267mtablet once a day. Take with food. 51 each 0   traMADol (ULTRAM) 50 MG tablet Take 1 tablet (50 mg total) by mouth every 6 (six) hours as needed. 30 tablet 0   No current facility-administered medications for this visit.    PHYSICAL EXAMINATION: ECOG PERFORMANCE STATUS: 2 - Symptomatic, <50% confined to bed  Vitals:   09/01/21 1231  BP: (!) 144/93  Pulse: 96  Resp: 19  SpO2: 94%   Wt Readings from Last 3 Encounters:  09/01/21 120 lb 4.8 oz (54.6 kg)  08/19/21 122 lb 12.8 oz (55.7 kg)  08/04/21 123 lb 4.8 oz (55.9 kg)     GENERAL:alert, no distress and comfortable SKIN: skin color normal, no rashes or significant lesions EYES: normal, Conjunctiva are pink and non-injected, sclera clear  NEURO: alert & oriented x 3 with fluent speech  LABORATORY DATA:  I have reviewed the data as listed CBC Latest Ref Rng & Units 08/19/2021 08/04/2021 07/21/2021  WBC 4.0 - 10.5 K/uL 5.9 8.7 8.4  Hemoglobin 12.0 - 15.0 g/dL 12.3 12.8 12.1  Hematocrit 36.0 - 46.0 % 35.9(L) 38.7 35.6(L)  Platelets 150 - 400 K/uL 247 261 228     CMP Latest Ref Rng & Units 08/19/2021 08/04/2021 07/21/2021  Glucose 70 - 99 mg/dL 104(H) 97 94  BUN 8 - 23 mg/dL 17 13 17   Creatinine 0.44 - 1.00 mg/dL 0.81 1.01(H) 0.83  Sodium 135 - 145 mmol/L 141 139 142  Potassium 3.5 - 5.1 mmol/L 3.7 4.1 3.9  Chloride 98 - 111 mmol/L 106 103 108  CO2 22 - 32 mmol/L 23 24 22   Calcium 8.9 - 10.3 mg/dL 9.4 9.3 9.3  Total Protein 6.5 - 8.1 g/dL 6.8 7.1 6.8  Total Bilirubin 0.3 - 1.2 mg/dL 0.4 0.5 0.5  Alkaline Phos 38 - 126 U/L 93 109 118  AST 15 - 41 U/L 22 18 27   ALT 0 - 44 U/L 11 13 18       RADIOGRAPHIC STUDIES: I have personally reviewed the radiological images as listed and agreed with the findings in the report. No results found.    No orders of the defined types were placed in this  encounter.  All questions were answered. The patient knows to call the clinic with any problems, questions or concerns. No barriers to learning was detected. The total time spent in the appointment was 30 minutes.     [name], MD 09/01/2021   I, Wilburn Mylar, am acting as scribe for [name], MD.   I have reviewed the above documentation for accuracy and completeness, and I agree with the above.

## 2021-09-02 ENCOUNTER — Encounter: Payer: Self-pay | Admitting: Hematology

## 2021-09-12 MED FILL — Dexamethasone Sodium Phosphate Inj 100 MG/10ML: INTRAMUSCULAR | Qty: 1 | Status: AC

## 2021-09-14 NOTE — Progress Notes (Deleted)
Tracy [name]   [phone] [phone]   Clinic Follow up Note   Patient Care Team: [name], DO as PCP - General (Family Medicine) [name], MD as Consulting Physician (Hematology) Icard, Octavio Graves, DO as Consulting Physician (Pulmonary Disease) Gorham, P.A. [name], MD as Consulting Physician (General Surgery) [name], MD as Consulting Physician (General Surgery) [name], [name], MD as Consulting Physician (Gastroenterology) 09/14/2021  CHIEF COMPLAINT: Follow up rectal cancer   SUMMARY OF ONCOLOGIC HISTORY: Oncology History Overview Note  Cancer Staging Rectal cancer [facility]) Staging form: Colon and Rectum, AJCC 8th Edition - Pathologic stage from 03/03/2020: Stage IIIB (pT4a, pN1b, cM0) - Signed by [name], MD on 01/28/2021 Stage prefix: Initial diagnosis Histologic grading system: 4 grade system Histologic grade (G): G2 Residual tumor (R): R0 - None    Rectal cancer ([place])  03/03/2020 - 04/05/2020 Hospital Admission   She was admitted to ED on 03/03/20 for abdominal pain and nausea. She had been having diarrhea for several weeks. During hospital stay she developed acute respiratory failure, AKI, RUE DVT from PICC line. Work up showed bowel perforation, small left liver mass and thickening of jejunum. She underwent emergent surgery on 03/03/20 for resection and colostomy placement. Her path showed invasive cancer, metastatic to 3/15 LNs. She had a NGT in placed but this was removed on POD 3. Post op her stoma became necrotic and septic. She had another bowel surgery on 03/12/20, which was NED with necrotic tissue.    03/03/2020 Imaging   CT AP 03/03/20  IMPRESSION: 1. Free intraperitoneal air consistent perforation. Most likely site of perforation is in the distal splenic flexure/proximal descending colon where there is a collection of air and gas measuring 6 centimeters. No obvious soft tissue mass  identified in this region. At this site, there is abrupt transition of dilated, stool-filled colon to completely decompressed proximal descending colon. 2. Thickened, inflamed loops of jejunum are identified within the pelvis and are likely reactive. 3. Small hiatal hernia. 4. Benign-appearing 1.6 centimeter mass the LEFT hepatic lobe. Recommend comparison with prior studies if available. 5.  Emphysema (ICD10-J43.9). 6.  Aortic Atherosclerosis (ICD10-I70.0). 7. Bilateral renal scarring.   03/03/2020 Surgery   low anterior resection end colostomy by Dr Marcello Moores    03/03/2020 Initial Biopsy   FINAL MICROSCOPIC DIAGNOSIS: 03/03/20 A. RECTOSIGMOID COLON, LOW ANTERIOR RESECTION:  - Invasive colonic adenocarcinoma, 3.5 cm.  - Tumor invades the visceral peritoneum.  - Margins of resection are not involved.  - Metastatic carcinoma in (3) of (15) lymph nodes.  - See oncology table.   B. ADDITIONAL SIGMOID COLON, RESECTION:  - Colonic tissue, negative for carcinoma.  ADDENDUM:  Mismatch Repair Protein (IHC)   SUMMARY INTERPRETATION: NORMAL  There is preserved expression of the major MMR proteins. There is a very  low probability that microsatellite instability (MSI) is present.  However, certain clinically significant MMR protein mutations may result  in preservation of nuclear expression. It is recommended that the  preservation of protein expression be correlated with molecular based  MSI testing.   IHC EXPRESSION RESULTS  TEST           RESULT  MLH1:          Preserved nuclear expression  MSH2:          Preserved nuclear expression  MSH6:          Preserved nuclear expression  PMS2:  Preserved nuclear expression   03/03/2020 Cancer Staging   Staging form: Colon and Rectum, AJCC 8th Edition - Pathologic stage from 03/03/2020: Stage IIIB (pT4a, pN1b, cM0) - Signed by [name], MD on 01/28/2021 Stage prefix: Initial diagnosis Histologic grading system: 4 grade  system Histologic grade (G): G2 Residual tumor (R): R0 - None   03/08/2020 Imaging   CT AP 03/08/20 IMPRESSION: 1. Interval midline laparotomy with distal colon resection and diverting left lower quadrant colostomy. 2. Diffuse small bowel dilatation with gas fluid levels most consistent with postoperative ileus. 3. Trace ascites within the abdomen and pelvis. No fluid collection or abscess at this time. Surgical drain within the lower pelvis. 4. Indeterminate 1.4 cm subcapsular liver hypodensity. In light of newly diagnosed rectal cancer, metastatic disease cannot be excluded. PET CT may be useful for further evaluation. 5. Interval development of trace bilateral pleural effusions and diffuse body wall edema.   03/12/2020 Surgery   EXPLORATORY LAPAROTOMY WITH BOWEL RESECTION AND COLOSTOMY by Dr Hassell Done 03/12/20  FINAL MICROSCOPIC DIAGNOSIS: 03/12/20  A. COLON, SPLENIC FLEXURE, RESECTION:  - Segment of colon (37 cm) with perforation and associated inflammation  - Multiple mucosal ulcers with necrotizing inflammation  - No evidence of malignancy    03/12/2020 Imaging   CT AP WO contrast 03/12/20  IMPRESSION: 1. Exam is limited by lack of intravenous contrast, motion, and artifact from the patient's arms adjacent to the torso. Since 03/08/2020, there has been development of relatively large pockets of intraperitoneal free air. While intraperitoneal gas would not be unexpected on postoperative day 9, this gas is new since an intervening study of 03/08/2020 and given the relatively large volume certainly raises concern for bowel perforation. No source for the intraperitoneal free air is evident on this study. There is a surgical drain in the pelvis, but there is no free gas around the drain itself to suggest that it represents the source. 2. New circumferential wall thickening in the splenic flexure, descending colon and sigmoid colon leading into the end  colostomy. Infection/inflammation would be a consideration. Ischemia cannot be excluded. 3. Relatively small volume intraperitoneal free fluid. High attenuation small fluid collections in the left upper abdomen may reflect hemorrhage, infection or residua from prior perforation. 4. Interval progression of diffuse body wall edema. 5. Residual contrast material in the renal parenchyma from prior imaging, compatible with renal dysfunction.    03/19/2020 Imaging   CT CAP 03/19/20  IMPRESSION: 1. 5.5 x 3.2 cm fluid collection is noted along the greater curvature of the proximal stomach. 2. Surgical drain is again noted in the pelvis with tip in left lower quadrant. 3. Interval development of crescent-shaped fluid collection measuring 16.4 x 3.3 cm in the epigastric region and left upper quadrant of the abdomen which may extend into the left lower quadrant. Potentially this may represent abscess or developing abscess. Multiple other smaller fluid collections are noted which may represent small abscesses. 4. 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 5. Mild amount of free fluid is noted in the posterior pelvis. 6. Moderate anasarca is noted. Aortic Atherosclerosis (ICD10-I70.0).   04/12/2020 Imaging   CT AP 04/12/20  IMPRESSION: 1. Fluid collections within the LEFT abdomen have significantly decreased compared to previous CT exams, now nearly completely resolved. 2. Percutaneous drainage catheter with tip coiled posterior to the LEFT kidney, stable positioning compared to the previous study. The more anterior catheter has been removed. 3. Small amount of free fluid persists within the abdomen and pelvis. 4.  Trace bibasilar pleural effusions. 5. Anasarca. 6. While reviewing today's study, comparing with a chest/abdomen/pelvis CT from earlier same month, there is question of thrombus in the LEFT internal jugular vein. Recommend ultrasound of the LEFT IJ to exclude DVT. This  recommendation discussed with patient's hospitalist, Dr. Owens Shark, on 04/12/2020 at 4:20 p.m.   Emphysema (ICD10-J43.9).   04/22/2020 Imaging   CT AP 04/22/20  IMPRESSION: 1. No recurrent intra-abdominal abscess status post interval removal of left retroperitoneal percutaneous drain. Stable small amount of free pelvic fluid. 2. Enlarging dependent bilateral pleural effusions, now moderate in volume. Associated atelectasis at both lung bases. 3. Progressive anasarca with generalized edema throughout the subcutaneous fat. 4. Stable small subcapsular fluid collection along the anterior aspect of the left hepatic lobe. 5. Aortic Atherosclerosis (ICD10-I70.0).   05/23/2020 Initial Diagnosis   Rectal cancer ([place])   01/14/2021 Imaging   CT CAP IMPRESSION: -5.0 cm soft tissue mass along the posterior aspect of the cecum at the base of the appendix, favored to reflect a peritoneal/serosal implant, corresponding to the patient's known adenocarcinoma. This is new from the prior. -Suspected additional pelvic implants along the uterus and right adnexa, poorly visualized, new/progressive from the prior. -Additional pericapsular lesion along the lateral spleen is mildly progressive, suspicious for additional peritoneal implant. -No evidence of metastatic disease in the chest.   01/27/2021 PET scan   IMPRESSION:  1. Extensive hypermetabolic peritoneal metastasis, as detailed  above.  2. No evidence of hypermetabolic supradiaphragmatic disease.  3. Diffuse low-level thyroid hypermetabolism can be seen in the  setting of thyroiditis. Consider correlation with thyroid function  labs.  4. Aortic atherosclerosis (ICD10-I70.0) and emphysema (ICD10-J43.9).    02/01/2021 -  Chemotherapy    First-line chemo Xeloda 1500 mg twice daily for day 1-14, every 21 days, started on 02/01/2021. Reduced to 1 week on/ 1 week off and added bevacizumab q2weeks from C2 on 02/24/21.  ----Given good tolerance, I changed to  CAPOX and bevacizumab q2weeks from C4 on 03/24/21 with Xeloda $RemoveBef'1500mg'vJvJTUTQUQ$  in the AM and $Remo'1000mg'ppcvQ$  in the PM 1 week on/1 week off.     05/08/2021 Imaging   PET  IMPRESSION: 1. There is been interval development of several FDG avid pulmonary nodules within both lungs. Additionally, there is new FDG avid low left paratracheal lymph node. Thoracic metastasis cannot be excluded. 2. Interval improvement in multifocal FDG avid peritoneal metastasis as detailed above.     08/14/2021 Imaging   CT CAP  IMPRESSION: Resolution of previously seen small bilateral pulmonary nodules since previous study.   Stable 8 mm mediastinal lymph node in the AP window.   No evidence of metastatic disease within the abdomen or pelvis.   Nonocclusive pulmonary embolism in bilateral central lower lobe pulmonary arteries, which appears subacute in age.   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 - Polypoid lesion obliterating appendiceal orifice. Biopsied to evaluate for malignancy. - The examined portion of the ileum was normal. - Two large polyps in the cecum, not removed today pending path at appendiceal orifice, bowel prep. If removed endoscopically in the future would favor EMR to be done at the hospital. - One 5 mm polyp at the ileocecal valve, removed with a cold snare. Resected and retrieved. - One 3 mm polyp in the transverse colon, removed with a cold snare. Resected and retrieved. - Parastomal hernia making  initial entry to the distal colon challenging. - The examination was otherwise normal.   01/06/2021 Initial Biopsy   Diagnosis 1. Colon, biopsy, appendiceal oraface - ADENOCARCINOMA. 2. Colon, polyp(s), ileocecal valve, transverse, x2 - TUBULAR ADENOMA, NEGATIVE FOR HIGH GRADE DYSPLASIA (X1). - COLONIC MUCOSA WITH UNDERLYING LYMPHOID  AGGREGATE, NEGATIVE FOR DYSPLASIA (X1).  MMR normal   01/06/2021 Initial Diagnosis   Malignant neoplasm of appendix ([facility])   01/14/2021 Imaging   CT CAP IMPRESSION: -5.0 cm soft tissue mass along the posterior aspect of the cecum at the base of the appendix, favored to reflect a peritoneal/serosal implant, corresponding to the patient's known adenocarcinoma. This is new from the prior. -Suspected additional pelvic implants along the uterus and right adnexa, poorly visualized, new/progressive from the prior. -Additional pericapsular lesion along the lateral spleen is mildly progressive, suspicious for additional peritoneal implant. -No evidence of metastatic disease in the chest.   08/14/2021 Imaging   CT CAP IMPRESSION: Resolution of previously seen small bilateral pulmonary nodules since previous study. Stable 8 mm mediastinal lymph node in the AP window. No evidence of metastatic disease within the abdomen or pelvis. Nonocclusive pulmonary embolism in bilateral central lower lobe pulmonary arteries, which appears subacute in age.  Aortic Atherosclerosis (ICD10-I70.0) and Emphysema (ICD10-J43.9).     08/14/2021 Imaging   CT CAP  IMPRESSION: Resolution of previously seen small bilateral pulmonary nodules since previous study.   Stable 8 mm mediastinal lymph node in the AP window.   No evidence of metastatic disease within the abdomen or pelvis.   Nonocclusive pulmonary embolism in bilateral central lower lobe pulmonary arteries, which appears subacute in age.     CURRENT THERAPY: First-line chemo Xeloda 1500 mg twice daily for day 1-14, every 21 days, started on 02/01/2021. Reduced to 1 week on/ 1 week off and added bevacizumab q2weeks from C2 on 02/24/21. Given good tolerance, changed to CAPOX and bevacizumab q2weeks from C4 on 03/24/21 with Xeloda 1500mg  in the AM and 1000mg  in the PM 1 week on/1 week off.  INTERVAL HISTORY: Tracy [name] returns for follow up and treatment as  scheduled. She was last seen 09/01/21 and completed another cycle of bevacizumab. Oxali was held for fatigue and persistent cold sensitivity. She continues xeloda ***. She is here today for eval prior to next CAPOX with low dose oxali.    REVIEW OF SYSTEMS:   Constitutional: Denies fevers, chills or abnormal weight loss Eyes: Denies blurriness of vision Ears, nose, mouth, throat, and face: Denies mucositis or sore throat Respiratory: Denies cough, dyspnea or wheezes Cardiovascular: Denies palpitation, chest discomfort or lower extremity swelling Gastrointestinal:  Denies nausea, heartburn or change in bowel habits Skin: Denies abnormal skin rashes Lymphatics: Denies new lymphadenopathy or easy bruising Neurological:Denies numbness, tingling or new weaknesses Behavioral/Psych: Mood is stable, no new changes  All other systems were reviewed with the patient and are negative.  MEDICAL HISTORY:  Past Medical History:  Diagnosis Date   Acute deep vein thrombosis (DVT) of brachial vein of right upper extremity (HCC)    Acute on chronic respiratory failure with hypoxia (HCC)    Asthma    Cataract    [place], HX OF 01/06/2011   Qualifier: Diagnosis of  By: 09/03/21 MD, Stacey     COPD (chronic obstructive pulmonary disease) (HCC) 2011   FeV1 31% predicted FeV1/FVX 47 %   COVID-19 05/2021   Essential hypertension 05/23/2020   Hemorrhoid    Open right radial fracture 2020   Osteopenia  Perforated sigmoid colon ([place]) 03/03/2020   Pleural effusion    Pneumonia 2021   Postoperative intra-abdominal abscess 2021   Rectal cancer (HCC)    Seasonal allergies    takes Claritin daily prn   Shock circulatory ([place])    Tracheostomy status ([place])    Vitamin D deficiency    takes Vit d every 14 days    SURGICAL HISTORY: Past Surgical History:  Procedure Laterality Date   AUGMENTATION MAMMAPLASTY     saline   EXAMINATION UNDER ANESTHESIA  10/14/2012   Procedure: EXAM UNDER ANESTHESIA;   Surgeon: Gayland Curry, MD,FACS;  Location: [facility];  Service: General;  Laterality: N/A;  rectal exam under anesthesia excisional hemorrhoidectomy hemorrhoidal banding x two   EXAMINATION UNDER ANESTHESIA  02/03/2013   excision hemorrhoidal tissue   FOOT SURGERY     left bunionectomy   HEMORRHOIDECTOMY WITH HEMORRHOID BANDING  10/14/2012   Procedure: HEMORRHOIDECTOMY WITH HEMORRHOID BANDING;  Surgeon: Gayland Curry, MD,FACS;  Location: [facility];  Service: General;  Laterality: N/A;   IR IMAGING GUIDED PORT INSERTION  02/03/2021   IR THORACENTESIS ASP PLEURAL SPACE W/IMG GUIDE  04/23/2020   LAPAROTOMY N/A 03/03/2020   Procedure: low anterior resection end colostomy;  Surgeon: [name], MD;  Location: [facility];  Service: General;  Laterality: N/A;   LAPAROTOMY N/A 03/12/2020   Procedure: EXPLORATORY LAPAROTOMY WITH BOWEL RESECTION AND COLOSTOMY;  Surgeon: [name], MD;  Location: [facility];  Service: General;  Laterality: N/A;   OPEN REDUCTION INTERNAL FIXATION (ORIF) DISTAL RADIAL FRACTURE Right 11/01/2018   Procedure: OPEN REDUCTION INTERNAL FIXATION (ORIF) DISTAL RADIAL FRACTURE;  Surgeon: [name], MD;  Location: [facility];  Service: Orthopedics;  Laterality: Right;   TONSILLECTOMY  1960   recurrent otitis media   US ECHOCARDIOGRAPHY  02/2020    poor windows, normal LV function, severely dilated RV with moderately reduced function, RV volume and pressure overload, mildly dilated RA    I have reviewed the social history and family history with the patient and they are unchanged from previous note.  ALLERGIES:  is allergic to amlodipine and codeine.  MEDICATIONS:  Current Outpatient Medications  Medication Sig Dispense Refill   albuterol (PROVENTIL) (2.5 MG/3ML) 0.083% nebulizer solution Take 3 mLs (2.5 mg total) by nebulization every 4 (four) hours as needed for wheezing or shortness of breath. DX: J44.9 360 mL 5   albuterol (VENTOLIN HFA) 108 (90 Base) MCG/ACT inhaler  Inhale 2 puffs into the lungs every 6 (six) hours as needed for wheezing or shortness of breath. 8 g 5   ALPRAZolam (XANAX) 0.25 MG tablet Take 1 tablet (0.25 mg total) by mouth at bedtime as needed for anxiety. 30 tablet 0   capecitabine (XELODA) 500 MG tablet Take 3 tablets (1,500 mg total) by mouth 2 (two) times daily after a meal. Take for 7 days on, 7 days off, repeat every 14 days. 84 tablet 1   diphenhydrAMINE (BENADRYL) 25 mg capsule Take 25 mg by mouth every 6 (six) hours as needed.     docusate sodium (COLACE) 50 MG capsule Take 50 mg by mouth daily as needed for mild constipation.     fluticasone (FLONASE) 50 MCG/ACT nasal spray Place 2 sprays into both nostrils daily. 16 mL 5   Fluticasone-Umeclidin-Vilant (TRELEGY ELLIPTA) 100-62.5-25 MCG/INH AEPB Inhale 1 puff into the lungs daily. 60 each 0   HYDROcodone bit-homatropine (HYDROMET) 5-1.5 MG/5ML syrup Take 5 mLs by mouth every 6 (six) hours as needed  for cough. 120 mL 0   lidocaine-prilocaine (EMLA) cream Apply 1 application topically as needed. 30 g 0   ondansetron (ZOFRAN) 8 MG tablet Take 1 tablet (8 mg total) by mouth every 8 (eight) hours as needed for nausea or vomiting. 20 tablet 2   prochlorperazine (COMPAZINE) 10 MG tablet Take 1 tablet (10 mg total) by mouth every 6 (six) hours as needed for nausea or vomiting. 30 tablet 2   RIVAROXABAN (XARELTO) VTE STARTER PACK (15 & 20 MG) Follow package directions: Take one $Remove'15mg'nTjShoR$  tablet by mouth twice a day. On day 22, switch to one $Remo'20mg'pSeSV$  tablet once a day. Take with food. 51 each 0   traMADol (ULTRAM) 50 MG tablet Take 1 tablet (50 mg total) by mouth every 6 (six) hours as needed. 30 tablet 0   No current facility-administered medications for this visit.    PHYSICAL EXAMINATION: ECOG PERFORMANCE STATUS: {CHL ONC ECOG PS:[phone]}  There were no vitals filed for this visit. There were no vitals filed for this visit.  GENERAL:alert, no distress and comfortable SKIN: skin color,  texture, turgor are normal, no rashes or significant lesions EYES: normal, Conjunctiva are pink and non-injected, sclera clear OROPHARYNX:no exudate, no erythema and lips, buccal mucosa, and tongue normal  NECK: supple, thyroid normal size, non-tender, without nodularity LYMPH:  no palpable lymphadenopathy in the cervical, axillary or inguinal LUNGS: clear to auscultation and percussion with normal breathing effort HEART: regular rate & rhythm and no murmurs and no lower extremity edema ABDOMEN:abdomen soft, non-tender and normal bowel sounds Musculoskeletal:no cyanosis of digits and no clubbing  NEURO: alert & oriented x 3 with fluent speech, no focal motor/sensory deficits  LABORATORY DATA:  I have reviewed the data as listed CBC Latest Ref Rng & Units 09/01/2021 08/19/2021 08/04/2021  WBC 4.0 - 10.5 K/uL 8.1 5.9 8.7  Hemoglobin 12.0 - 15.0 g/dL 12.7 12.3 12.8  Hematocrit 36.0 - 46.0 % 38.3 35.9(L) 38.7  Platelets 150 - 400 K/uL 233 247 261     CMP Latest Ref Rng & Units 09/01/2021 08/19/2021 08/04/2021  Glucose 70 - 99 mg/dL 112(H) 104(H) 97  BUN 8 - 23 mg/dL $Remove'16 17 13  'aIzYFYO$ Creatinine 0.44 - 1.00 mg/dL 0.85 0.81 1.01(H)  Sodium 135 - 145 mmol/L 138 141 139  Potassium 3.5 - 5.1 mmol/L 3.7 3.7 4.1  Chloride 98 - 111 mmol/L 103 106 103  CO2 22 - 32 mmol/L $RemoveB'23 23 24  'ACLuZmMQ$ Calcium 8.9 - 10.3 mg/dL 9.3 9.4 9.3  Total Protein 6.5 - 8.1 g/dL 7.0 6.8 7.1  Total Bilirubin 0.3 - 1.2 mg/dL 0.5 0.4 0.5  Alkaline Phos 38 - 126 U/L 106 93 109  AST 15 - 41 U/L $Remo'25 22 18  'qkDFo$ ALT 0 - 44 U/L $Remo'15 11 13      'Qwefh$ RADIOGRAPHIC STUDIES: I have personally reviewed the radiological images as listed and agreed with the findings in the report. No results found.   ASSESSMENT & PLAN:  No problem-specific Assessment & Plan notes found for this encounter.   No orders of the defined types were placed in this encounter.  All questions were answered. The patient knows to call the clinic with any problems, questions or  concerns. No barriers to learning was detected. I spent {CHL ONC TIME VISIT - RKYHC:[id number]} counseling the patient face to face. The total time spent in the appointment was {CHL ONC TIME VISIT - VVOHY:[id number]} and more than 50% was on counseling and review of test results  Alla Feeling, NP 09/14/21

## 2021-09-15 ENCOUNTER — Inpatient Hospital Stay: Payer: Medicare HMO

## 2021-09-15 ENCOUNTER — Other Ambulatory Visit: Payer: Self-pay

## 2021-09-15 ENCOUNTER — Telehealth (HOSPITAL_BASED_OUTPATIENT_CLINIC_OR_DEPARTMENT_OTHER): Payer: Self-pay

## 2021-09-15 ENCOUNTER — Telehealth: Payer: Self-pay

## 2021-09-15 ENCOUNTER — Ambulatory Visit (HOSPITAL_BASED_OUTPATIENT_CLINIC_OR_DEPARTMENT_OTHER)
Admission: RE | Admit: 2021-09-15 | Discharge: 2021-09-15 | Disposition: A | Discharge status: Home / Self Care | Payer: Medicare HMO | Source: Ambulatory Visit | Attending: Hematology | Admitting: Hematology

## 2021-09-15 ENCOUNTER — Encounter: Payer: Self-pay | Admitting: Hematology

## 2021-09-15 ENCOUNTER — Inpatient Hospital Stay: Payer: Medicare HMO | Admitting: Hematology

## 2021-09-15 ENCOUNTER — Inpatient Hospital Stay: Payer: Medicare HMO | Attending: Hematology

## 2021-09-15 VITALS — BP 153/83 | HR 106 | Temp 97.8°F | Resp 20 | Ht 62.0 in | Wt 117.2 lb

## 2021-09-15 DIAGNOSIS — Z933 Colostomy status: Secondary | ICD-10-CM | POA: Diagnosis not present

## 2021-09-15 DIAGNOSIS — Z7901 Long term (current) use of anticoagulants: Secondary | ICD-10-CM | POA: Diagnosis not present

## 2021-09-15 DIAGNOSIS — Z86718 Personal history of other venous thrombosis and embolism: Secondary | ICD-10-CM | POA: Diagnosis not present

## 2021-09-15 DIAGNOSIS — C20 Malignant neoplasm of rectum: Secondary | ICD-10-CM

## 2021-09-15 DIAGNOSIS — Z7952 Long term (current) use of systemic steroids: Secondary | ICD-10-CM | POA: Diagnosis not present

## 2021-09-15 DIAGNOSIS — Z86711 Personal history of pulmonary embolism: Secondary | ICD-10-CM | POA: Diagnosis not present

## 2021-09-15 DIAGNOSIS — C786 Secondary malignant neoplasm of retroperitoneum and peritoneum: Secondary | ICD-10-CM | POA: Insufficient documentation

## 2021-09-15 DIAGNOSIS — C181 Malignant neoplasm of appendix: Secondary | ICD-10-CM | POA: Diagnosis present

## 2021-09-15 DIAGNOSIS — Z79899 Other long term (current) drug therapy: Secondary | ICD-10-CM | POA: Insufficient documentation

## 2021-09-15 DIAGNOSIS — I82621 Acute embolism and thrombosis of deep veins of right upper extremity: Secondary | ICD-10-CM

## 2021-09-15 DIAGNOSIS — I10 Essential (primary) hypertension: Secondary | ICD-10-CM | POA: Insufficient documentation

## 2021-09-15 DIAGNOSIS — Z5111 Encounter for antineoplastic chemotherapy: Secondary | ICD-10-CM | POA: Diagnosis not present

## 2021-09-15 DIAGNOSIS — C799 Secondary malignant neoplasm of unspecified site: Secondary | ICD-10-CM

## 2021-09-15 DIAGNOSIS — I700 Atherosclerosis of aorta: Secondary | ICD-10-CM | POA: Diagnosis not present

## 2021-09-15 LAB — CMP (CANCER CENTER ONLY)
ALT: 12 U/L (ref 0–44)
AST: 22 U/L (ref 15–41)
Albumin: 3.8 g/dL (ref 3.5–5.0)
Alkaline Phosphatase: 114 U/L (ref 38–126)
Anion gap: 12 (ref 5–15)
BUN: 16 mg/dL (ref 8–23)
CO2: 23 mmol/L (ref 22–32)
Calcium: 9.7 mg/dL (ref 8.9–10.3)
Chloride: 105 mmol/L (ref 98–111)
Creatinine: 0.83 mg/dL (ref 0.44–1.00)
GFR, Estimated: >60 mL/min
Glucose, Bld: 107 mg/dL — ABNORMAL HIGH (ref 70–99)
Potassium: 3.2 mmol/L — ABNORMAL LOW (ref 3.5–5.1)
Sodium: 140 mmol/L (ref 135–145)
Total Bilirubin: 0.7 mg/dL (ref 0.3–1.2)
Total Protein: 7 g/dL (ref 6.5–8.1)

## 2021-09-15 LAB — CBC WITH DIFFERENTIAL (CANCER CENTER ONLY)
Abs Immature Granulocytes: 0.01 K/uL (ref 0.00–0.07)
Basophils Absolute: 0 K/uL (ref 0.0–0.1)
Basophils Relative: 1 %
Eosinophils Absolute: 0.1 K/uL (ref 0.0–0.5)
Eosinophils Relative: 1 %
HCT: 37.8 % (ref 36.0–46.0)
Hemoglobin: 12.5 g/dL (ref 12.0–15.0)
Immature Granulocytes: 0 %
Lymphocytes Relative: 21 %
Lymphs Abs: 1.4 K/uL (ref 0.7–4.0)
MCH: 33.5 pg (ref 26.0–34.0)
MCHC: 33.1 g/dL (ref 30.0–36.0)
MCV: 101.3 fL — ABNORMAL HIGH (ref 80.0–100.0)
Monocytes Absolute: 0.9 K/uL (ref 0.1–1.0)
Monocytes Relative: 14 %
Neutro Abs: 4.1 K/uL (ref 1.7–7.7)
Neutrophils Relative %: 63 %
Platelet Count: 224 K/uL (ref 150–400)
RBC: 3.73 MIL/uL — ABNORMAL LOW (ref 3.87–5.11)
RDW: 16.3 % — ABNORMAL HIGH (ref 11.5–15.5)
WBC Count: 6.5 K/uL (ref 4.0–10.5)
nRBC: 0 % (ref 0.0–0.2)

## 2021-09-15 LAB — TOTAL PROTEIN, URINE DIPSTICK: Protein, ur: <30 mg/dL — AB

## 2021-09-15 LAB — CEA (IN HOUSE-CHCC): CEA (CHCC-In House): 3.46 ng/mL (ref 0.00–5.00)

## 2021-09-15 MED ORDER — IOHEXOL 300 MG/ML  SOLN
75.0000 mL | Freq: Once | INTRAMUSCULAR | Status: AC | PRN
  Administered 2021-09-15: 75 mL via INTRAVENOUS

## 2021-09-15 MED ORDER — RIVAROXABAN 20 MG PO TABS: 20.0000 mg | ORAL_TABLET | Freq: Every day | ORAL | 0 refills | Status: DC

## 2021-09-15 MED ORDER — SODIUM CHLORIDE 0.9% FLUSH
10.0000 mL | Freq: Once | INTRAVENOUS | Status: AC
  Administered 2021-09-15: 10 mL

## 2021-09-15 NOTE — Telephone Encounter (Signed)
This nurse spoke with patient and made aware that Dr. Burr Medico has ordered a stat CT on her to be done today.  Made patient of [place] location and address.  Patient acknowledged understanding.  No further questions or concerns at this time.

## 2021-09-15 NOTE — Progress Notes (Signed)
[place]   [phone] [phone]   Clinic Follow up Note   Patient Care Team: [name], DO as PCP - General (Family Medicine) [name], MD as Consulting Physician (Hematology) Icard, Octavio Graves, DO as Consulting Physician (Pulmonary Disease) [facility] [name], MD as Consulting Physician (General Surgery) [name], MD as Consulting Physician (General Surgery) [name], [name], MD as Consulting Physician (Gastroenterology)  Date of Service:  09/15/2021  CHIEF COMPLAINT: f/u of rectal cancer  CURRENT THERAPY:  First-line chemo Xeloda 1500 mg twice daily for day 1-14, every 21 days, started on 02/01/2021. Reduced to 1 week on/ 1 week off and added bevacizumab q2weeks from C2 on 02/24/21. Given good tolerance, changed to CAPOX and bevacizumab q2weeks from C4 on 03/24/21 with Xeloda 1531m in the AM and 10058min the PM 1 week on/1 week off.  ASSESSMENT & PLAN:  DoHANEEFAH VENTURINIs a [age].o. female with   1. [name], MMR normal, peritoneal metastasis in 01/2021 -diagnosed 02/2020, due to perforation underwent emergent surgery. Path showed invasive adenocarcinoma with perforation, +3/15 LNs, and clear margins. Initial CT scan negative for distant metastasis. -due to bowel perforation and + LNs she has very high risk for recurrence, unfortunately she was not a candidate for adjuvant chemo due to very slow recovery from surgery -Guardant Reveal ctDNA were all negative x3 (06/06/20, 07/12/20, and 09/10/20). -surveillance CT 08/27/20 showed a small density adjacent to the spleen that had decreased, no other evidence of recurrent or metastatic disease -first surveillance colonoscopy by Dr. ArHavery Moros/24/22 showed new adenocarcinoma at appendiceal orifice and other tubular adenomas throughout the colon.  Biopsy confirmed adenocarcinoma -01/27/21 PET showed tumor in appendiceal  orifice is likely peritoneal metastasis growing into the cecum.  Her peritoneal implants are mainly in the pelvis, difficult to biopsy. -She began single agent Xeloda with cycle 1 on 02/01/21.  Due to low performance status with cycle 2 was changed to 1 week on/1 week off, and beva was added. She tolerates this well -PS improved and low dose oxaliplatin was added with C4, now on CAPOX/beva q2 weeks, with Xeloda 1 week on/1 week off. -PET scan 05/08/21 showed good response to peritoneal metastasis, with the development of several FDG avid pulmonary nodules, and hypermetabolic left paratracheal node, the differential includes infectious/inflammatory versus thoracic metastasis.  -Chemo held briefly due to COVID-19 and respiratory infection -restaging CT CAP on 08/14/21 showed resolution of pulmonary nodules, stable mediastinal node, no clear radiographic metastatic disease. -Oxaliplatin was held to 2 weeks ago due to her worsening cold sensitivity and fatigue -Due to her worsening dyspnea today, I will get a CTA today to rule out PE, and hold chemotherapy today.   2. Bilateral PE, found on restaging CT CAP 08/14/21  -no hypoxia, stable respiratory status  -no need for inpatient management, she began xarelto 08/15/21 -hold Beva for 1-2 months given recent PE   3. COVID-19+ 05/21/21 and respiratory infection -she received CAPOX/beva on 05/19/21, tested positive on 05/21/21 -treated with paxlovid and subsequently doxycycline, augmentin, and prednisone -chest x-ray 06/23/21 showed only emphysema -resolved   4. H/o provoked RUE DVT 04/04/20 s/p 3 months AC (Eliquis)   5. Comorbidities: Asthma, COPD, anxiety -on nebulizer and trelegy, has not needed rescue inhaler recently -f/u with pulmonology and PCP -she has had increased SOB recently.     PLAN: -Due to her significant dyspnea on exertion and mild hypoxia on exertion (pulse  ox dropped to 88%) today, will hold on chemotherapy, and proceeded with CTA chest to  rule out PE -if CT scan negative, will resume chemo next Monday, hold Beva for 2 months due to her recent PE   Addendum Her CT chest with contrast was negative for PE today, I called patient and left her message.  I refilled her Xarelto 20 mg daily today.  We will restart Capox next Monday, hold bevacizumab.   No problem-specific Assessment & Plan notes found for this encounter.   SUMMARY OF ONCOLOGIC HISTORY: Oncology History Overview Note  Cancer Staging Rectal cancer [facility]) Staging form: Colon and Rectum, AJCC 8th Edition - Pathologic stage from 03/03/2020: Stage IIIB (pT4a, pN1b, cM0) - Signed by [name], MD on 01/28/2021 Stage prefix: Initial diagnosis Histologic grading system: 4 grade system Histologic grade (G): G2 Residual tumor (R): R0 - None    Rectal cancer (Tracy [name])  03/03/2020 - 04/05/2020 Hospital Admission   She was admitted to ED on 03/03/20 for abdominal pain and nausea. She had been having diarrhea for several weeks. During hospital stay she developed acute respiratory failure, AKI, RUE DVT from PICC line. Work up showed bowel perforation, small left liver mass and thickening of jejunum. She underwent emergent surgery on 03/03/20 for resection and colostomy placement. Her path showed invasive cancer, metastatic to 3/15 LNs. She had a NGT in placed but this was removed on POD 3. Post op her stoma became necrotic and septic. She had another bowel surgery on 03/12/20, which was NED with necrotic tissue.    03/03/2020 Imaging   CT AP 03/03/20  IMPRESSION: 1. Free intraperitoneal air consistent perforation. Most likely site of perforation is in the distal splenic flexure/proximal descending colon where there is a collection of air and gas measuring 6 centimeters. No obvious soft tissue mass identified in this region. At this site, there is abrupt transition of dilated, stool-filled colon to completely decompressed proximal descending colon. 2. Thickened, inflamed loops of jejunum  are identified within the pelvis and are likely reactive. 3. Small hiatal hernia. 4. Benign-appearing 1.6 centimeter mass the LEFT hepatic lobe. Recommend comparison with prior studies if available. 5.  Emphysema (ICD10-J43.9). 6.  Aortic Atherosclerosis (ICD10-I70.0). 7. Bilateral renal scarring.   03/03/2020 Surgery   low anterior resection end colostomy by Dr Marcello Moores    03/03/2020 Initial Biopsy   FINAL MICROSCOPIC DIAGNOSIS: 03/03/20 A. RECTOSIGMOID COLON, LOW ANTERIOR RESECTION:  - Invasive colonic adenocarcinoma, 3.5 cm.  - Tumor invades the visceral peritoneum.  - Margins of resection are not involved.  - Metastatic carcinoma in (3) of (15) lymph nodes.  - See oncology table.   B. ADDITIONAL SIGMOID COLON, RESECTION:  - Colonic tissue, negative for carcinoma.  ADDENDUM:  Mismatch Repair Protein (IHC)   SUMMARY INTERPRETATION: NORMAL  There is preserved expression of the major MMR proteins. There is a very  low probability that microsatellite instability (MSI) is present.  However, certain clinically significant MMR protein mutations may result  in preservation of nuclear expression. It is recommended that the  preservation of protein expression be correlated with molecular based  MSI testing.   IHC EXPRESSION RESULTS  TEST           RESULT  MLH1:          Preserved nuclear expression  MSH2:          Preserved nuclear expression  MSH6:          Preserved nuclear expression  PMS2:  Preserved nuclear expression   03/03/2020 Cancer Staging   Staging form: Colon and Rectum, AJCC 8th Edition - Pathologic stage from 03/03/2020: Stage IIIB (pT4a, pN1b, cM0) - Signed by [name], MD on 01/28/2021 Stage prefix: Initial diagnosis Histologic grading system: 4 grade system Histologic grade (G): G2 Residual tumor (R): R0 - None   03/08/2020 Imaging   CT AP 03/08/20 IMPRESSION: 1. Interval midline laparotomy with distal colon resection and diverting left lower quadrant  colostomy. 2. Diffuse small bowel dilatation with gas fluid levels most consistent with postoperative ileus. 3. Trace ascites within the abdomen and pelvis. No fluid collection or abscess at this time. Surgical drain within the lower pelvis. 4. Indeterminate 1.4 cm subcapsular liver hypodensity. In light of newly diagnosed rectal cancer, metastatic disease cannot be excluded. PET CT may be useful for further evaluation. 5. Interval development of trace bilateral pleural effusions and diffuse body wall edema.   03/12/2020 Surgery   EXPLORATORY LAPAROTOMY WITH BOWEL RESECTION AND COLOSTOMY by Dr Hassell Done 03/12/20  FINAL MICROSCOPIC DIAGNOSIS: 03/12/20  A. COLON, SPLENIC FLEXURE, RESECTION:  - Segment of colon (37 cm) with perforation and associated inflammation  - Multiple mucosal ulcers with necrotizing inflammation  - No evidence of malignancy    03/12/2020 Imaging   CT AP WO contrast 03/12/20  IMPRESSION: 1. Exam is limited by lack of intravenous contrast, motion, and artifact from the patient's arms adjacent to the torso. Since 03/08/2020, there has been development of relatively large pockets of intraperitoneal free air. While intraperitoneal gas would not be unexpected on postoperative day 9, this gas is new since an intervening study of 03/08/2020 and given the relatively large volume certainly raises concern for bowel perforation. No source for the intraperitoneal free air is evident on this study. There is a surgical drain in the pelvis, but there is no free gas around the drain itself to suggest that it represents the source. 2. New circumferential wall thickening in the splenic flexure, descending colon and sigmoid colon leading into the end colostomy. Infection/inflammation would be a consideration. Ischemia cannot be excluded. 3. Relatively small volume intraperitoneal free fluid. High attenuation small fluid collections in the left upper abdomen may reflect hemorrhage,  infection or residua from prior perforation. 4. Interval progression of diffuse body wall edema. 5. Residual contrast material in the renal parenchyma from prior imaging, compatible with renal dysfunction.    03/19/2020 Imaging   CT CAP 03/19/20  IMPRESSION: 1. 5.5 x 3.2 cm fluid collection is noted along the greater curvature of the proximal stomach. 2. Surgical drain is again noted in the pelvis with tip in left lower quadrant. 3. Interval development of crescent-shaped fluid collection measuring 16.4 x 3.3 cm in the epigastric region and left upper quadrant of the abdomen which may extend into the left lower quadrant. Potentially this may represent abscess or developing abscess. Multiple other smaller fluid collections are noted which may represent small abscesses. 4. 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 5. Mild amount of free fluid is noted in the posterior pelvis. 6. Moderate anasarca is noted. Aortic Atherosclerosis (ICD10-I70.0).   04/12/2020 Imaging   CT AP 04/12/20  IMPRESSION: 1. Fluid collections within the LEFT abdomen have significantly decreased compared to previous CT exams, now nearly completely resolved. 2. Percutaneous drainage catheter with tip coiled posterior to the LEFT kidney, stable positioning compared to the previous study. The more anterior catheter has been removed. 3. Small amount of free fluid persists within the abdomen and pelvis. 4.  Trace bibasilar pleural effusions. 5. Anasarca. 6. While reviewing today's study, comparing with a chest/abdomen/pelvis CT from earlier same month, there is question of thrombus in the LEFT internal jugular vein. Recommend ultrasound of the LEFT IJ to exclude DVT. This recommendation discussed with patient's hospitalist, Dr. Owens Shark, on 04/12/2020 at 4:20 p.m.   Emphysema (ICD10-J43.9).   04/22/2020 Imaging   CT AP 04/22/20  IMPRESSION: 1. No recurrent intra-abdominal abscess status post interval removal of  left retroperitoneal percutaneous drain. Stable small amount of free pelvic fluid. 2. Enlarging dependent bilateral pleural effusions, now moderate in volume. Associated atelectasis at both lung bases. 3. Progressive anasarca with generalized edema throughout the subcutaneous fat. 4. Stable small subcapsular fluid collection along the anterior aspect of the left hepatic lobe. 5. Aortic Atherosclerosis (ICD10-I70.0).   05/23/2020 Initial Diagnosis   Rectal cancer ([place])   01/14/2021 Imaging   CT CAP IMPRESSION: -5.0 cm soft tissue mass along the posterior aspect of the cecum at the base of the appendix, favored to reflect a peritoneal/serosal implant, corresponding to the patient's known adenocarcinoma. This is new from the prior. -Suspected additional pelvic implants along the uterus and right adnexa, poorly visualized, new/progressive from the prior. -Additional pericapsular lesion along the lateral spleen is mildly progressive, suspicious for additional peritoneal implant. -No evidence of metastatic disease in the chest.   01/27/2021 PET scan   IMPRESSION:  1. Extensive hypermetabolic peritoneal metastasis, as detailed  above.  2. No evidence of hypermetabolic supradiaphragmatic disease.  3. Diffuse low-level thyroid hypermetabolism can be seen in the  setting of thyroiditis. Consider correlation with thyroid function  labs.  4. Aortic atherosclerosis (ICD10-I70.0) and emphysema (ICD10-J43.9).    02/01/2021 -  Chemotherapy    First-line chemo Xeloda 1500 mg twice daily for day 1-14, every 21 days, started on 02/01/2021. Reduced to 1 week on/ 1 week off and added bevacizumab q2weeks from C2 on 02/24/21.  ----Given good tolerance, I changed to CAPOX and bevacizumab q2weeks from C4 on 03/24/21 with Xeloda 1575m in the AM and 10057min the PM 1 week on/1 week off.     05/08/2021 Imaging   PET  IMPRESSION: 1. There is been interval development of several FDG avid pulmonary nodules  within both lungs. Additionally, there is new FDG avid low left paratracheal lymph node. Thoracic metastasis cannot be excluded. 2. Interval improvement in multifocal FDG avid peritoneal metastasis as detailed above.     08/14/2021 Imaging   CT CAP  IMPRESSION: Resolution of previously seen small bilateral pulmonary nodules since previous study.   Stable 8 mm mediastinal lymph node in the AP window.   No evidence of metastatic disease within the abdomen or pelvis.   Nonocclusive pulmonary embolism in bilateral central lower lobe pulmonary arteries, which appears subacute in age.   [place] 01[place] Procedure   (surveillance colonoscopy for h/o rectal cancer) Impression:  - Preparation of the colon was fair, lavage performed with mostly adequate views. - Polypoid lesion obliterating appendiceal orifice. Biopsied to evaluate for malignancy. - The examined portion of the ileum was normal. - Two large polyps in the cecum, not removed today pending path at appendiceal orifice, bowel prep. If removed endoscopically in the future would favor EMR to be done at the hospital. - One 5 mm polyp at the ileocecal valve, removed with a cold snare. Resected and retrieved. - One 3 mm polyp in the transverse colon, removed with a cold snare. Resected and retrieved. - Parastomal hernia making  initial entry to the distal colon challenging. - The examination was otherwise normal.   01/06/2021 Initial Biopsy   Diagnosis 1. Colon, biopsy, appendiceal oraface - ADENOCARCINOMA. 2. Colon, polyp(s), ileocecal valve, transverse, x2 - TUBULAR ADENOMA, NEGATIVE FOR HIGH GRADE DYSPLASIA (X1). - COLONIC MUCOSA WITH UNDERLYING LYMPHOID AGGREGATE, NEGATIVE FOR DYSPLASIA (X1).  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 This is new from the prior. -Suspected additional pelvic implants along the uterus and right adnexa, poorly visualized, new/progressive from the prior. -Additional pericapsular lesion along the lateral spleen is mildly progressive, suspicious for additional peritoneal implant. -No evidence of metastatic disease in the chest.   08/14/2021 Imaging   CT CAP IMPRESSION: Resolution of previously seen small bilateral pulmonary nodules since previous study. Stable 8 mm mediastinal lymph node in the AP window. No evidence of metastatic disease within the abdomen or pelvis. Nonocclusive pulmonary embolism in bilateral central lower lobe pulmonary arteries, which appears subacute in age.  Aortic Atherosclerosis (ICD10-I70.0) and Emphysema (ICD10-J43.9).     08/14/2021 Imaging   CT CAP  IMPRESSION: Resolution of previously seen small bilateral pulmonary nodules since previous study.   Stable 8 mm mediastinal lymph node in the AP window.   No evidence of metastatic disease within the abdomen or pelvis.   Nonocclusive pulmonary embolism in bilateral central lower lobe pulmonary arteries, which appears subacute in age.      INTERVAL HISTORY:  Tracy [name] is here for a follow up of rectal cancer. She was last seen by me on 09/01/21. She presents to the clinic alone. She reports she is very short of breath, which she notes began a few days ago. She wonders if it's related to her allergies. She notes she takes benadryl for her allergies, which she notes helps her runny nose/post-nasal drip. She denies chest pain, fever, or chills. She notes she is unable to lay flat after walking. She reports she had an episode of stomach pains last week, which resolved with medication.   All other systems were reviewed with the patient and are negative.  MEDICAL HISTORY:  Past Medical  History:  Diagnosis Date   Acute deep vein thrombosis (DVT) of brachial vein of right upper extremity (HCC)    Acute on chronic respiratory failure with hypoxia (HCC)    Asthma    Cataract    CHICKENPOX, HX OF 01/06/2011   Qualifier: Diagnosis of  By: Charlett Blake MD, St[place]     COPD (chronic obstructive pulmonary disease) ([place]) 2011   FeV1 31% predicted FeV1/FVX 47 %   COVID-19 05/2021   Essential hypertension 05/23/2020   Hemorrhoid    Open right radial fracture 2020   Osteopenia    Perforated sigmoid colon ([place]) 03/03/2020   Pleural effusion    Pneumonia 2021   Postoperative intra-abdominal abscess 2021   Rectal cancer (HCC)    Seasonal allergies    takes Claritin daily prn   Shock circulatory ([place])    Tracheostomy status ([place])    Vitamin D deficiency    takes Vit d every 14 days    SURGICAL HISTORY: Past Surgical History:  Procedure Laterality Date   AUGMENTATION MAMMAPLASTY     saline   EXAMINATION UNDER ANESTHESIA  10/14/2012  Procedure: EXAM UNDER ANESTHESIA;  Surgeon: Gayland Curry, MD,FACS;  Location: [place];  Service: General;  Laterality: N/A;  rectal exam under anesthesia excisional hemorrhoidectomy hemorrhoidal banding x two   EXAMINATION UNDER ANESTHESIA  02/03/2013   excision hemorrhoidal tissue   FOOT SURGERY     left bunionectomy   HEMORRHOIDECTOMY WITH HEMORRHOID BANDING  10/14/2012   Procedure: HEMORRHOIDECTOMY WITH HEMORRHOID BANDING;  Surgeon: Gayland Curry, MD,FACS;  Location: [place];  Service: General;  Laterality: N/A;   IR IMAGING GUIDED PORT INSERTION  02/03/2021   IR THORACENTESIS ASP PLEURAL SPACE W/IMG GUIDE  04/23/2020   LAPAROTOMY N/A 03/03/2020   Procedure: low anterior resection end colostomy;  Surgeon: [name], MD;  Location: [facility];  Service: General;  Laterality: N/A;   LAPAROTOMY N/A 03/12/2020   Procedure: EXPLORATORY LAPAROTOMY WITH BOWEL RESECTION AND COLOSTOMY;  Surgeon: [name], MD;  Location: [facility];  Service: General;   Laterality: N/A;   OPEN REDUCTION INTERNAL FIXATION (ORIF) DISTAL RADIAL FRACTURE Right 11/01/2018   Procedure: OPEN REDUCTION INTERNAL FIXATION (ORIF) DISTAL RADIAL FRACTURE;  Surgeon: [name], MD;  Location: [place];  Service: Orthopedics;  Laterality: Right;   TONSILLECTOMY  1960   recurrent otitis media   US ECHOCARDIOGRAPHY  02/2020    poor windows, normal LV function, severely dilated RV with moderately reduced function, RV volume and pressure overload, mildly dilated RA    I have reviewed the social history and family history with the patient and they are unchanged from previous note.  ALLERGIES:  is allergic to amlodipine and codeine.  MEDICATIONS:  Current Outpatient Medications  Medication Sig Dispense Refill   albuterol (PROVENTIL) (2.5 MG/3ML) 0.083% nebulizer solution Take 3 mLs (2.5 mg total) by nebulization every 4 (four) hours as needed for wheezing or shortness of breath. DX: J44.9 360 mL 5   albuterol (VENTOLIN HFA) 108 (90 Base) MCG/ACT inhaler Inhale 2 puffs into the lungs every 6 (six) hours as needed for wheezing or shortness of breath. 8 g 5   ALPRAZolam (XANAX) 0.25 MG tablet Take 1 tablet (0.25 mg total) by mouth at bedtime as needed for anxiety. 30 tablet 0   capecitabine (XELODA) 500 MG tablet Take 3 tablets (1,500 mg total) by mouth 2 (two) times daily after a meal. Take for 7 days on, 7 days off, repeat every 14 days. 84 tablet 1   diphenhydrAMINE (BENADRYL) 25 mg capsule Take 25 mg by mouth every 6 (six) hours as needed.     docusate sodium (COLACE) 50 MG capsule Take 50 mg by mouth daily as needed for mild constipation.     fluticasone (FLONASE) 50 MCG/ACT nasal spray Place 2 sprays into both nostrils daily. 16 mL 5   Fluticasone-Umeclidin-Vilant (TRELEGY ELLIPTA) 100-62.5-25 MCG/INH AEPB Inhale 1 puff into the lungs daily. 60 each 0   HYDROcodone bit-homatropine (HYDROMET) 5-1.5 MG/5ML syrup Take 5 mLs by mouth every 6 (six) hours as  needed for cough. 120 mL 0   lidocaine-prilocaine (EMLA) cream Apply 1 application topically as needed. 30 g 0   ondansetron (ZOFRAN) 8 MG tablet Take 1 tablet (8 mg total) by mouth every 8 (eight) hours as needed for nausea or vomiting. 20 tablet 2   prochlorperazine (COMPAZINE) 10 MG tablet Take 1 tablet (10 mg total) by mouth every 6 (six) hours as needed for nausea or vomiting. 30 tablet 2   RIVAROXABAN (XARELTO) VTE STARTER PACK (15 & 20 MG) Follow package directions: Take one 48m tablet  by mouth twice a day. On day 22, switch to one 83m tablet once a day. Take with food. 51 each 0   traMADol (ULTRAM) 50 MG tablet Take 1 tablet (50 mg total) by mouth every 6 (six) hours as needed. 30 tablet 0   No current facility-administered medications for this visit.    PHYSICAL EXAMINATION: ECOG PERFORMANCE STATUS: 3 - Symptomatic, >50% confined to bed  There were no vitals filed for this visit. Wt Readings from Last 3 Encounters:  09/01/21 120 lb 4.8 oz (54.6 kg)  08/19/21 122 lb 12.8 oz (55.7 kg)  08/04/21 123 lb 4.8 oz (55.9 kg)     GENERAL:alert, no distress and comfortable SKIN: skin color, texture, turgor are normal, no rashes or significant lesions EYES: normal, Conjunctiva are pink and non-injected, sclera clear OROPHARYNX: no erythema, lips and tongue normal  LUNGS: clear to auscultation and percussion with normal breathing effort HEART: regular rate & rhythm and no murmurs and no lower extremity edema ABDOMEN:abdomen soft, non-tender and normal bowel sounds Musculoskeletal:no cyanosis of digits and no clubbing  NEURO: alert & oriented x 3 with fluent speech, no focal motor/sensory deficits  LABORATORY DATA:  I have reviewed the data as listed CBC Latest Ref Rng & Units 09/01/2021 08/19/2021 08/04/2021  WBC 4.0 - 10.5 K/uL 8.1 5.9 8.7  Hemoglobin 12.0 - 15.0 g/dL 12.7 12.3 12.8  Hematocrit 36.0 - 46.0 % 38.3 35.9(L) 38.7  Platelets 150 - 400 K/uL 233 247 261     CMP Latest Ref  Rng & Units 09/01/2021 08/19/2021 08/04/2021  Glucose 70 - 99 mg/dL 112(H) 104(H) 97  BUN 8 - 23 mg/dL 16 17 13   Creatinine 0.44 - 1.00 mg/dL 0.85 0.81 1.01(H)  Sodium 135 - 145 mmol/L 138 141 139  Potassium 3.5 - 5.1 mmol/L 3.7 3.7 4.1  Chloride 98 - 111 mmol/L 103 106 103  CO2 22 - 32 mmol/L 23 23 24   Calcium 8.9 - 10.3 mg/dL 9.3 9.4 9.3  Total Protein 6.5 - 8.1 g/dL 7.0 6.8 7.1  Total Bilirubin 0.3 - 1.2 mg/dL 0.5 0.4 0.5  Alkaline Phos 38 - 126 U/L 106 93 109  AST 15 - 41 U/L 25 22 18   ALT 0 - 44 U/L 15 11 13       RADIOGRAPHIC STUDIES: I have personally reviewed the radiological images as listed and agreed with the findings in the report. No results found.    No orders of the defined types were placed in this encounter.  All questions were answered. The patient knows to call the clinic with any problems, questions or concerns. No barriers to learning was detected. The total time spent in the appointment was 40 minutes.     KAurea Graff10/02/2021   I, KWilburn Mylar am acting as scribe for YTruitt Merle MD.   I have reviewed the above documentation for accuracy and completeness, and I agree with the above.

## 2021-09-16 ENCOUNTER — Ambulatory Visit (HOSPITAL_BASED_OUTPATIENT_CLINIC_OR_DEPARTMENT_OTHER): Admission: RE | Admit: 2021-09-16 | Payer: Medicare HMO | Source: Ambulatory Visit

## 2021-09-16 ENCOUNTER — Telehealth: Payer: Self-pay

## 2021-09-16 ENCOUNTER — Other Ambulatory Visit: Payer: Self-pay | Admitting: Hematology

## 2021-09-16 ENCOUNTER — Ambulatory Visit (HOSPITAL_COMMUNITY): Admission: RE | Admit: 2021-09-16 | Payer: Medicare HMO | Source: Ambulatory Visit

## 2021-09-16 DIAGNOSIS — Z933 Colostomy status: Secondary | ICD-10-CM | POA: Diagnosis not present

## 2021-09-16 NOTE — Telephone Encounter (Signed)
This nurse spoke with patient, made aware of CTA results per MD and recommendations.  Confirmed port was de-accessed.  No further questions or concerns at this time. Patient knows to call clinic with any concerns or problems.

## 2021-09-16 NOTE — Telephone Encounter (Signed)
-----   Message from [name], MD sent at 09/15/2021  8:46 PM EDT ----- Tracy [name],  Her CTA was negative for PE and other acute findings, I left her a VM this evening. Please call her to confirm that she has received my message, make sure her port was de-accessed. Let her know will hold chemo this week, and I have sent a schedule message to schedule her lab, f/u and chemo next Monday or Tuesday

## 2021-09-17 DIAGNOSIS — Z933 Colostomy status: Secondary | ICD-10-CM | POA: Diagnosis not present

## 2021-09-18 DIAGNOSIS — Z933 Colostomy status: Secondary | ICD-10-CM | POA: Diagnosis not present

## 2021-09-19 DIAGNOSIS — C20 Malignant neoplasm of rectum: Secondary | ICD-10-CM | POA: Diagnosis not present

## 2021-09-19 DIAGNOSIS — J90 Pleural effusion, not elsewhere classified: Secondary | ICD-10-CM | POA: Diagnosis not present

## 2021-09-19 DIAGNOSIS — M6281 Muscle weakness (generalized): Secondary | ICD-10-CM | POA: Diagnosis not present

## 2021-09-19 DIAGNOSIS — Z933 Colostomy status: Secondary | ICD-10-CM | POA: Diagnosis not present

## 2021-09-19 DIAGNOSIS — E43 Unspecified severe protein-calorie malnutrition: Secondary | ICD-10-CM | POA: Diagnosis not present

## 2021-09-19 DIAGNOSIS — I82621 Acute embolism and thrombosis of deep veins of right upper extremity: Secondary | ICD-10-CM | POA: Diagnosis not present

## 2021-09-19 DIAGNOSIS — R69 Illness, unspecified: Secondary | ICD-10-CM | POA: Diagnosis not present

## 2021-09-19 DIAGNOSIS — J449 Chronic obstructive pulmonary disease, unspecified: Secondary | ICD-10-CM | POA: Diagnosis not present

## 2021-09-19 DIAGNOSIS — R1310 Dysphagia, unspecified: Secondary | ICD-10-CM | POA: Diagnosis not present

## 2021-09-19 DIAGNOSIS — J9600 Acute respiratory failure, unspecified whether with hypoxia or hypercapnia: Secondary | ICD-10-CM | POA: Diagnosis not present

## 2021-09-20 DIAGNOSIS — Z933 Colostomy status: Secondary | ICD-10-CM | POA: Diagnosis not present

## 2021-09-21 DIAGNOSIS — Z933 Colostomy status: Secondary | ICD-10-CM | POA: Diagnosis not present

## 2021-09-22 DIAGNOSIS — Z933 Colostomy status: Secondary | ICD-10-CM | POA: Diagnosis not present

## 2021-09-23 DIAGNOSIS — Z933 Colostomy status: Secondary | ICD-10-CM | POA: Diagnosis not present

## 2021-09-24 ENCOUNTER — Ambulatory Visit (INDEPENDENT_AMBULATORY_CARE_PROVIDER_SITE_OTHER): Payer: Medicare HMO | Admitting: Pulmonary Disease

## 2021-09-24 ENCOUNTER — Other Ambulatory Visit: Payer: Self-pay

## 2021-09-24 ENCOUNTER — Encounter: Payer: Self-pay | Admitting: Pulmonary Disease

## 2021-09-24 VITALS — BP 124/68 | HR 90 | Temp 97.5°F | Ht 62.0 in | Wt 120.8 lb

## 2021-09-24 DIAGNOSIS — J449 Chronic obstructive pulmonary disease, unspecified: Secondary | ICD-10-CM

## 2021-09-24 DIAGNOSIS — C20 Malignant neoplasm of rectum: Secondary | ICD-10-CM | POA: Diagnosis not present

## 2021-09-24 DIAGNOSIS — Z933 Colostomy status: Secondary | ICD-10-CM | POA: Diagnosis not present

## 2021-09-24 DIAGNOSIS — Z9889 Other specified postprocedural states: Secondary | ICD-10-CM | POA: Diagnosis not present

## 2021-09-24 MED ORDER — PREDNISONE 10 MG PO TABS: ORAL_TABLET | ORAL | 0 refills | Status: DC

## 2021-09-24 MED ORDER — HYDROCODONE BIT-HOMATROP MBR 5-1.5 MG/5ML PO SOLN: 5.0000 mL | Freq: Four times a day (QID) | ORAL | 0 refills | Status: DC | PRN

## 2021-09-24 MED ORDER — DOXYCYCLINE HYCLATE 100 MG PO TABS: 100.0000 mg | ORAL_TABLET | Freq: Two times a day (BID) | ORAL | 0 refills | Status: DC

## 2021-09-24 NOTE — Patient Instructions (Addendum)
Thank you for visiting Dr. Valeta Harms at [facility]. Today we recommend the following:  Meds ordered this encounter  Medications   predniSONE (DELTASONE) 10 MG tablet    Sig: Take 4 tabs by mouth once daily x4 days, then 3 tabs x4 days, 2 tabs x4 days, 1 tab x4 days and stop.    Dispense:  40 tablet    Refill:  0   doxycycline (VIBRA-TABS) 100 MG tablet    Sig: Take 1 tablet (100 mg total) by mouth 2 (two) times daily.    Dispense:  14 tablet    Refill:  0   HYDROcodone bit-homatropine (HYCODAN) 5-1.5 MG/5ML syrup    Sig: Take 5 mLs by mouth every 6 (six) hours as needed for cough.    Dispense:  240 mL    Refill:  0   Handicap sticker application  Continue trelegy and albuterol   Return in about 1 year (around 09/24/2022), or if symptoms worsen or fail to improve.    Please do your part to reduce the spread of COVID-19.

## 2021-09-24 NOTE — Progress Notes (Signed)
Synopsis: Referred in August 2020 to establish care former patient of [name], DO  Subjective:   PATIENT ID: Tracy [name] GENDER: female DOB: [birth year], MRN: [id number]  Chief Complaint  Patient presents with   Follow-up    Pt. Needs refills on medicine.    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 Lenna Gilford.  Last seen in the office August 2018.  At the time was trying to get established with Medicare.  Currently doing well on Stiolto plus nebulizer plus albuterol rescue inhaler.  Within a years time was treated with antibiotics for an exacerbation.  She gets most of her medications from a mail order pharmacy in [place].  Dr. Lenna Gilford wanted follow-up pulmonary function tests on last office visit 2018 but patient declined.  Overall here today for medication refills.  She is having difficulty with affording the Stiolto and would like to try something else.  She has not had pulmonary function test completed in the past or anytime soon.  She would like to wait until most of the COVID coronavirus issues have declined.  She wanted to meet me today before getting new prescriptions or medications.  Her respiratory symptoms are stable.  She does have significant shortness of breath or chest tightness when she is outside or the weather is extremely hot or humid.  She denies hemoptysis cough fever sputum production.  OV 09/24/2021: Patient was last seen in the office by me in 2020.  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  She had a significantly prolonged hospitalization was on mechanical life support ultimately transition to tracheostomy tube.  I placed her tracheostomy when she was in the hospital.  Thankfully she was decannulated and ultimately discharged home to rehabilitation and now she is back living independently.  She is seeing medical oncology and receiving  treatments for her colon cancer.  Overall doing much better.  From a COPD standpoint she is currently managed with Trelegy and as needed albuterol.  She has somewhat made a miraculous recovery after having such a significant event and prolonged hospitalization.    Past Medical History:  Diagnosis Date   Acute deep vein thrombosis (DVT) of brachial vein of right upper extremity (HCC)    Acute on chronic respiratory failure with hypoxia (HCC)    Asthma    Cataract    CHICKENPOX, HX OF 01/06/2011   Qualifier: Diagnosis of  By: Charlett Blake MD, Stacey     COPD (chronic obstructive pulmonary disease) ([place]) 2011   FeV1 31% predicted FeV1/FVX 47 %   COVID-19 05/2021   Essential hypertension 05/23/2020   Hemorrhoid    Open right radial fracture 2020   Osteopenia    Perforated sigmoid colon ([place]) 03/03/2020   Pleural effusion    Pneumonia 2021   Postoperative intra-abdominal abscess 2021   Rectal cancer (HCC)    Seasonal allergies    takes Claritin daily prn   Shock circulatory ([place])    Tracheostomy status ([place])    Vitamin D deficiency    takes Vit d every 14 days     Family History  Problem Relation Age of Onset   Osteoporosis Mother    Other Father        [place] in snow, broke ankle, blood clot to lungs   Heart murmur Sister    Cancer Sister        leukemia  Other Brother        h/o being hit by lightening   Parkinsonism Brother    Asthma Son    Allergies Son    Otitis media Son    Other Sister        CML   Asthma Son    Allergies Son    Pancreatic cancer Brother 46       diag December 19, 2013-deceased 02-18-2014   Prostate cancer Brother    Prostate cancer Paternal Uncle    Colon cancer Neg Hx    Rectal cancer Neg Hx    Stomach cancer Neg Hx      Past Surgical History:  Procedure Laterality Date   AUGMENTATION MAMMAPLASTY     saline   EXAMINATION UNDER ANESTHESIA  10/14/2012   Procedure: EXAM UNDER ANESTHESIA;  Surgeon: Gayland Curry, MD,FACS;  Location: [facility];  Service:  General;  Laterality: N/A;  rectal exam under anesthesia excisional hemorrhoidectomy hemorrhoidal banding x two   EXAMINATION UNDER ANESTHESIA  02/03/2013   excision hemorrhoidal tissue   FOOT SURGERY     left bunionectomy   HEMORRHOIDECTOMY WITH HEMORRHOID BANDING  10/14/2012   Procedure: HEMORRHOIDECTOMY WITH HEMORRHOID BANDING;  Surgeon: Gayland Curry, MD,FACS;  Location: [facility];  Service: General;  Laterality: N/A;   IR IMAGING GUIDED PORT INSERTION  02/03/2021   IR THORACENTESIS ASP PLEURAL SPACE W/IMG GUIDE  04/23/2020   LAPAROTOMY N/A 03/03/2020   Procedure: low anterior resection end colostomy;  Surgeon: [name], MD;  Location: [facility];  Service: General;  Laterality: N/A;   LAPAROTOMY N/A 03/12/2020   Procedure: EXPLORATORY LAPAROTOMY WITH BOWEL RESECTION AND COLOSTOMY;  Surgeon: [name], MD;  Location: [facility];  Service: General;  Laterality: N/A;   OPEN REDUCTION INTERNAL FIXATION (ORIF) DISTAL RADIAL FRACTURE Right 11/01/2018   Procedure: OPEN REDUCTION INTERNAL FIXATION (ORIF) DISTAL RADIAL FRACTURE;  Surgeon: [name], MD;  Location: [facility];  Service: Orthopedics;  Laterality: Right;   TONSILLECTOMY  1960   recurrent otitis media   US ECHOCARDIOGRAPHY  02/2020    poor windows, normal LV function, severely dilated RV with moderately reduced function, RV volume and pressure overload, mildly dilated RA    Social History   Socioeconomic History   Marital status: Married    Spouse name: Not on file   Number of children: 3   Years of education: Not on file   Highest education level: Not on file  Occupational History   Occupation: real estate agent   Tobacco Use   Smoking status: Former    Packs/day: 0.25    Years: 20.00    Pack years: 5.00    Types: Cigarettes    Quit date: 09/14/1991    Years since quitting: 30.0   Smokeless tobacco: Never  Vaping Use   Vaping Use: Never used  Substance and Sexual Activity   Alcohol use: Yes     Alcohol/week: 7.0 standard drinks    Types: 7 Glasses of wine per week    Comment: stopped in 02/2020   Drug use: No   Sexual activity: Yes    Partners: Male    Birth control/protection: Post-menopausal  Other Topics Concern   Not on file  Social History Narrative   Marital status/children/pets: Married.  2 children.   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   Safety:      -smoke alarm in the home:Yes     - wears seatbelt: Yes     - Feels safe in  their relationships: Yes   Social Determinants of Radio broadcast assistant Strain: Not on file  Food Insecurity: Not on file  Transportation Needs: Not on file  Physical Activity: Not on file  Stress: Not on file  Social Connections: Not on file  Intimate Partner Violence: Not on file     Allergies  Allergen Reactions   Amlodipine     Leg swelling.    Codeine Swelling     Outpatient Medications Prior to Visit  Medication Sig Dispense Refill   albuterol (PROVENTIL) (2.5 MG/3ML) 0.083% nebulizer solution Take 3 mLs (2.5 mg total) by nebulization every 4 (four) hours as needed for wheezing or shortness of breath. DX: J44.9 360 mL 5   albuterol (VENTOLIN HFA) 108 (90 Base) MCG/ACT inhaler Inhale 2 puffs into the lungs every 6 (six) hours as needed for wheezing or shortness of breath. 8 g 5   ALPRAZolam (XANAX) 0.25 MG tablet Take 1 tablet (0.25 mg total) by mouth at bedtime as needed for anxiety. 30 tablet 0   capecitabine (XELODA) 500 MG tablet Take 3 tablets (1,500 mg total) by mouth 2 (two) times daily after a meal. Take for 7 days on, 7 days off, repeat every 14 days. 84 tablet 1   diphenhydrAMINE (BENADRYL) 25 mg capsule Take 25 mg by mouth every 6 (six) hours as needed.     docusate sodium (COLACE) 50 MG capsule Take 50 mg by mouth daily as needed for mild constipation.     fluticasone (FLONASE) 50 MCG/ACT nasal spray Place 2 sprays into both nostrils daily. 16 mL 5   Fluticasone-Umeclidin-Vilant (TRELEGY ELLIPTA)  100-62.5-25 MCG/INH AEPB Inhale 1 puff into the lungs daily. 60 each 0   HYDROcodone bit-homatropine (HYDROMET) 5-1.5 MG/5ML syrup Take 5 mLs by mouth every 6 (six) hours as needed for cough. 120 mL 0   lidocaine-prilocaine (EMLA) cream Apply 1 application topically as needed. 30 g 0   ondansetron (ZOFRAN) 8 MG tablet Take 1 tablet (8 mg total) by mouth every 8 (eight) hours as needed for nausea or vomiting. 20 tablet 2   prochlorperazine (COMPAZINE) 10 MG tablet Take 1 tablet (10 mg total) by mouth every 6 (six) hours as needed for nausea or vomiting. 30 tablet 2   rivaroxaban (XARELTO) 20 MG TABS tablet Take 1 tablet (20 mg total) by mouth daily with supper. 30 tablet 0   traMADol (ULTRAM) 50 MG tablet Take 1 tablet (50 mg total) by mouth every 6 (six) hours as needed. 30 tablet 0   No facility-administered medications prior to visit.    Review of Systems  Constitutional:  Negative for chills, fever, malaise/fatigue and weight loss.  HENT:  Negative for hearing loss, sore throat and tinnitus.   Eyes:  Negative for blurred vision and double vision.  Respiratory:  Positive for cough and shortness of breath. Negative for hemoptysis, sputum production, wheezing and stridor.   Cardiovascular:  Negative for chest pain, palpitations, orthopnea, leg swelling and PND.  Gastrointestinal:  Negative for abdominal pain, constipation, diarrhea, heartburn, nausea and vomiting.  Genitourinary:  Negative for dysuria, hematuria and urgency.  Musculoskeletal:  Negative for joint pain and myalgias.  Skin:  Negative for itching and rash.  Neurological:  Negative for dizziness, tingling, weakness and headaches.  Endo/Heme/Allergies:  Negative for environmental allergies. Does not bruise/bleed easily.  Psychiatric/Behavioral:  Negative for depression. The patient is not nervous/anxious and does not have insomnia.   All other systems reviewed and are negative.   Objective:  Physical Exam Vitals reviewed.   Constitutional:      General: She is not in acute distress.    Appearance: She is well-developed.  HENT:     Head: Normocephalic and atraumatic.     Mouth/Throat:     Pharynx: No oropharyngeal exudate.     Comments: Tracheostomy scar Eyes:     Conjunctiva/sclera: Conjunctivae normal.     Pupils: Pupils are equal, round, and reactive to light.  Neck:     Vascular: No JVD.     Trachea: No tracheal deviation.     Comments: Loss of supraclavicular fat Cardiovascular:     Rate and Rhythm: Normal rate and regular rhythm.     Heart sounds: S1 normal and S2 normal.     Comments: Distant heart tones Pulmonary:     Effort: No tachypnea or accessory muscle usage.     Breath sounds: No stridor. Decreased breath sounds (throughout all lung fields) present. No wheezing, rhonchi or rales.  Abdominal:     General: Bowel sounds are normal. There is no distension.     Palpations: Abdomen is soft.     Tenderness: There is no abdominal tenderness.     Comments: Status post ostomy  Musculoskeletal:        General: Deformity (muscle wasting ) present.  Skin:    General: Skin is warm and dry.     Capillary Refill: Capillary refill takes less than 2 seconds.     Findings: No rash.  Neurological:     Mental Status: She is alert and oriented to person, place, and time.  Psychiatric:        Behavior: Behavior normal.     Vitals:   09/24/21 1203  BP: 124/68  Pulse: 90  Temp: (!) 97.5 F (36.4 C)  TempSrc: Oral  SpO2: 95%  Weight: 120 lb 12.8 oz (54.8 kg)  Height: 5\' 2"  (1.575 m)   95% on RA BMI Readings from Last 3 Encounters:  09/24/21 22.09 kg/m  09/15/21 21.44 kg/m  09/01/21 22.00 kg/m   Wt Readings from Last 3 Encounters:  09/24/21 120 lb 12.8 oz (54.8 kg)  09/15/21 117 lb 3.2 oz (53.2 kg)  09/01/21 120 lb 4.8 oz (54.6 kg)     CBC    Component Value Date/Time   WBC 6.5 09/15/2021 1009   WBC 8.8 02/03/2021 1322   RBC 3.73 (L) 09/15/2021 1009   HGB 12.5 09/15/2021  1009   HCT 37.8 09/15/2021 1009   PLT 224 09/15/2021 1009   MCV 101.3 (H) 09/15/2021 1009   MCH 33.5 09/15/2021 1009   MCHC 33.1 09/15/2021 1009   RDW 16.3 (H) 09/15/2021 1009   LYMPHSABS 1.4 09/15/2021 1009   MONOABS 0.9 09/15/2021 1009   EOSABS 0.1 09/15/2021 1009   BASOSABS 0.0 09/15/2021 1009     Chest Imaging: January 2014: Evidence of emphysema on chest x-ray The patient's images have been independently reviewed by me.    Pulmonary Functions Testing Results: No flowsheet data found.  FeNO: None  Pathology: None  Echocardiogram: None  Heart Catheterization: None    Assessment & Plan:     ICD-10-CM   1. COPD, severe ([place])  J44.9     2. Rectal cancer ([place])  C20     3. COPD with chronic bronchitis ([place])  J44.9     4. H/O tracheostomy  Z98.890       Discussion:  This is a 70 year old female, severe COPD previous FEV1 of 30% predicted, former smoker.  Currently managed on triple therapy inhaler regimen with Trelegy plus as needed albuterol along prolonged hospitalization with a perforated colon cancer status post tracheostomy now decannulated.  She has a history of DVT/PE.  She had a recent CTA of the chest that was negative for PE.  She was on Xarelto that had been stopped by oncology.  And they recommended restarting it recently.  It was too expensive for her.  She is seeing oncology again next week.  Plan: She is somewhat debilitated short of breath unable to walk greater than 200 feet without feeling short of breath she is limited by her lung disease. Paperwork filled out today for handicap placard. We gave her medications to keep at home for treatment as a rescue pack for her COPD. Usually once a year she develops chest tightness shortness of breath and ultimately needs antibiotics and steroids. Patient was given doxycycline and prednisone to keep on at home as well as a prescription for her cough medicine. With her bowel surgery she has had an abdominal  hernia has significant amount of pain when she has any chronic cough. Will try to help palliate the symptoms as much as we can. Follow-up with oncology regarding anticoagulation. See [name] in 1 year or as needed   Current Outpatient Medications:    albuterol (PROVENTIL) (2.5 MG/3ML) 0.083% nebulizer solution, Take 3 mLs (2.5 mg total) by nebulization every 4 (four) hours as needed for wheezing or shortness of breath. DX: J44.9, Disp: 360 mL, Rfl: 5   albuterol (VENTOLIN HFA) 108 (90 Base) MCG/ACT inhaler, Inhale 2 puffs into the lungs every 6 (six) hours as needed for wheezing or shortness of breath., Disp: 8 g, Rfl: 5   ALPRAZolam (XANAX) 0.25 MG tablet, Take 1 tablet (0.25 mg total) by mouth at bedtime as needed for anxiety., Disp: 30 tablet, Rfl: 0   capecitabine (XELODA) 500 MG tablet, Take 3 tablets (1,500 mg total) by mouth 2 (two) times daily after a meal. Take for 7 days on, 7 days off, repeat every 14 days., Disp: 84 tablet, Rfl: 1   diphenhydrAMINE (BENADRYL) 25 mg capsule, Take 25 mg by mouth every 6 (six) hours as needed., Disp: , Rfl:    docusate sodium (COLACE) 50 MG capsule, Take 50 mg by mouth daily as needed for mild constipation., Disp: , Rfl:    doxycycline (VIBRA-TABS) 100 MG tablet, Take 1 tablet (100 mg total) by mouth 2 (two) times daily., Disp: 14 tablet, Rfl: 0   fluticasone (FLONASE) 50 MCG/ACT nasal spray, Place 2 sprays into both nostrils daily., Disp: 16 mL, Rfl: 5   Fluticasone-Umeclidin-Vilant (TRELEGY ELLIPTA) 100-62.5-25 MCG/INH AEPB, Inhale 1 puff into the lungs daily., Disp: 60 each, Rfl: 0   HYDROcodone bit-homatropine (HYCODAN) 5-1.5 MG/5ML syrup, Take 5 mLs by mouth every 6 (six) hours as needed for cough., Disp: 240 mL, Rfl: 0   HYDROcodone bit-homatropine (HYDROMET) 5-1.5 MG/5ML syrup, Take 5 mLs by mouth every 6 (six) hours as needed for cough., Disp: 120 mL, Rfl: 0   lidocaine-prilocaine (EMLA) cream, Apply 1 application topically as needed., Disp: 30 g, Rfl:  0   ondansetron (ZOFRAN) 8 MG tablet, Take 1 tablet (8 mg total) by mouth every 8 (eight) hours as needed for nausea or vomiting., Disp: 20 tablet, Rfl: 2   predniSONE (DELTASONE) 10 MG tablet, Take 4 tabs by mouth once daily x4 days, then 3 tabs x4 days, 2 tabs x4 days, 1 tab x4 days and stop., Disp: 40 tablet, Rfl: 0  prochlorperazine (COMPAZINE) 10 MG tablet, Take 1 tablet (10 mg total) by mouth every 6 (six) hours as needed for nausea or vomiting., Disp: 30 tablet, Rfl: 2   rivaroxaban (XARELTO) 20 MG TABS tablet, Take 1 tablet (20 mg total) by mouth daily with supper., Disp: 30 tablet, Rfl: 0   traMADol (ULTRAM) 50 MG tablet, Take 1 tablet (50 mg total) by mouth every 6 (six) hours as needed., Disp: 30 tablet, Rfl: 0   [name], DO [facility] 09/24/2021 12:33 PM

## 2021-09-25 DIAGNOSIS — Z933 Colostomy status: Secondary | ICD-10-CM | POA: Diagnosis not present

## 2021-09-26 ENCOUNTER — Other Ambulatory Visit: Payer: Self-pay | Admitting: Pulmonary Disease

## 2021-09-26 DIAGNOSIS — Z933 Colostomy status: Secondary | ICD-10-CM | POA: Diagnosis not present

## 2021-09-26 MED ORDER — ALBUTEROL SULFATE (2.5 MG/3ML) 0.083% IN NEBU: 2.5000 mg | INHALATION_SOLUTION | RESPIRATORY_TRACT | 5 refills | Status: DC | PRN

## 2021-09-26 MED ORDER — TRELEGY ELLIPTA 100-62.5-25 MCG/INH IN AEPB: 1.0000 | INHALATION_SPRAY | Freq: Every day | RESPIRATORY_TRACT | 5 refills | Status: DC

## 2021-09-26 MED FILL — Dexamethasone Sodium Phosphate Inj 100 MG/10ML: INTRAMUSCULAR | Qty: 1 | Status: AC

## 2021-09-26 NOTE — Addendum Note (Signed)
Addended by: Dessie Coma on: 09/26/2021 10:50 AM   Modules accepted: Orders

## 2021-09-27 DIAGNOSIS — Z933 Colostomy status: Secondary | ICD-10-CM | POA: Diagnosis not present

## 2021-09-28 DIAGNOSIS — Z933 Colostomy status: Secondary | ICD-10-CM | POA: Diagnosis not present

## 2021-09-29 ENCOUNTER — Inpatient Hospital Stay: Payer: Medicare HMO

## 2021-09-29 ENCOUNTER — Encounter: Payer: Self-pay | Admitting: Hematology

## 2021-09-29 ENCOUNTER — Inpatient Hospital Stay (HOSPITAL_BASED_OUTPATIENT_CLINIC_OR_DEPARTMENT_OTHER): Payer: Medicare HMO | Admitting: Hematology

## 2021-09-29 ENCOUNTER — Other Ambulatory Visit: Payer: Self-pay

## 2021-09-29 VITALS — BP 150/76 | HR 93 | Temp 97.8°F | Resp 20 | Ht 62.0 in | Wt 118.9 lb

## 2021-09-29 VITALS — BP 141/77

## 2021-09-29 DIAGNOSIS — C20 Malignant neoplasm of rectum: Secondary | ICD-10-CM

## 2021-09-29 DIAGNOSIS — Z5111 Encounter for antineoplastic chemotherapy: Secondary | ICD-10-CM | POA: Diagnosis not present

## 2021-09-29 DIAGNOSIS — Z86718 Personal history of other venous thrombosis and embolism: Secondary | ICD-10-CM | POA: Diagnosis not present

## 2021-09-29 DIAGNOSIS — Z7952 Long term (current) use of systemic steroids: Secondary | ICD-10-CM | POA: Diagnosis not present

## 2021-09-29 DIAGNOSIS — Z7901 Long term (current) use of anticoagulants: Secondary | ICD-10-CM | POA: Diagnosis not present

## 2021-09-29 DIAGNOSIS — Z79899 Other long term (current) drug therapy: Secondary | ICD-10-CM | POA: Diagnosis not present

## 2021-09-29 DIAGNOSIS — Z86711 Personal history of pulmonary embolism: Secondary | ICD-10-CM | POA: Diagnosis not present

## 2021-09-29 DIAGNOSIS — C181 Malignant neoplasm of appendix: Secondary | ICD-10-CM | POA: Diagnosis not present

## 2021-09-29 DIAGNOSIS — I10 Essential (primary) hypertension: Secondary | ICD-10-CM | POA: Diagnosis not present

## 2021-09-29 DIAGNOSIS — C786 Secondary malignant neoplasm of retroperitoneum and peritoneum: Secondary | ICD-10-CM | POA: Diagnosis not present

## 2021-09-29 DIAGNOSIS — C799 Secondary malignant neoplasm of unspecified site: Secondary | ICD-10-CM

## 2021-09-29 DIAGNOSIS — Z933 Colostomy status: Secondary | ICD-10-CM | POA: Diagnosis not present

## 2021-09-29 LAB — CMP (CANCER CENTER ONLY)
ALT: 11 U/L (ref 0–44)
AST: 22 U/L (ref 15–41)
Albumin: 3.4 g/dL — ABNORMAL LOW (ref 3.5–5.0)
Alkaline Phosphatase: 132 U/L — ABNORMAL HIGH (ref 38–126)
Anion gap: 11 (ref 5–15)
BUN: 13 mg/dL (ref 8–23)
CO2: 24 mmol/L (ref 22–32)
Calcium: 9.5 mg/dL (ref 8.9–10.3)
Chloride: 105 mmol/L (ref 98–111)
Creatinine: 0.81 mg/dL (ref 0.44–1.00)
GFR, Estimated: >60 mL/min (ref >60)
Glucose, Bld: 91 mg/dL (ref 70–99)
Potassium: 4.1 mmol/L (ref 3.5–5.1)
Sodium: 140 mmol/L (ref 135–145)
Total Bilirubin: 0.7 mg/dL (ref 0.3–1.2)
Total Protein: 6.9 g/dL (ref 6.5–8.1)

## 2021-09-29 LAB — CBC WITH DIFFERENTIAL (CANCER CENTER ONLY)
Abs Immature Granulocytes: 0.01 10*3/uL (ref 0.00–0.07)
Basophils Absolute: 0 10*3/uL (ref 0.0–0.1)
Basophils Relative: 0 %
Eosinophils Absolute: 0 10*3/uL (ref 0.0–0.5)
Eosinophils Relative: 0 %
HCT: 37.5 % (ref 36.0–46.0)
Hemoglobin: 12.3 g/dL (ref 12.0–15.0)
Immature Granulocytes: 0 %
Lymphocytes Relative: 16 %
Lymphs Abs: 1.1 10*3/uL (ref 0.7–4.0)
MCH: 33.9 pg (ref 26.0–34.0)
MCHC: 32.8 g/dL (ref 30.0–36.0)
MCV: 103.3 fL — ABNORMAL HIGH (ref 80.0–100.0)
Monocytes Absolute: 0.6 10*3/uL (ref 0.1–1.0)
Monocytes Relative: 9 %
Neutro Abs: 4.9 10*3/uL (ref 1.7–7.7)
Neutrophils Relative %: 75 %
Platelet Count: 233 10*3/uL (ref 150–400)
RBC: 3.63 MIL/uL — ABNORMAL LOW (ref 3.87–5.11)
RDW: 14.7 % (ref 11.5–15.5)
WBC Count: 6.7 10*3/uL (ref 4.0–10.5)
nRBC: 0 % (ref 0.0–0.2)

## 2021-09-29 LAB — TOTAL PROTEIN, URINE DIPSTICK: Protein, ur: 30 mg/dL — AB

## 2021-09-29 MED ORDER — PALONOSETRON HCL INJECTION 0.25 MG/5ML
0.2500 mg | Freq: Once | INTRAVENOUS | Status: AC
  Administered 2021-09-29: 0.25 mg via INTRAVENOUS
  Filled 2021-09-29: qty 5

## 2021-09-29 MED ORDER — DEXTROSE 5 % IV SOLN: Freq: Once | INTRAVENOUS | Status: AC

## 2021-09-29 MED ORDER — SODIUM CHLORIDE 0.9% FLUSH
10.0000 mL | Freq: Once | INTRAVENOUS | Status: AC
  Administered 2021-09-29: 10 mL

## 2021-09-29 MED ORDER — SODIUM CHLORIDE 0.9 % IV SOLN
10.0000 mg | Freq: Once | INTRAVENOUS | Status: AC
  Administered 2021-09-29: 10 mg via INTRAVENOUS
  Filled 2021-09-29: qty 10

## 2021-09-29 MED ORDER — SODIUM CHLORIDE 0.9% FLUSH
10.0000 mL | INTRAVENOUS | Status: DC | PRN
  Administered 2021-09-29: 10 mL

## 2021-09-29 MED ORDER — HEPARIN SOD (PORK) LOCK FLUSH 100 UNIT/ML IV SOLN
500.0000 [IU] | Freq: Once | INTRAVENOUS | Status: AC | PRN
  Administered 2021-09-29: 500 [IU]

## 2021-09-29 MED ORDER — DEXTROSE 5 % IV SOLN
50.0000 mg/m2 | Freq: Once | INTRAVENOUS | Status: AC
  Administered 2021-09-29: 80 mg via INTRAVENOUS
  Filled 2021-09-29: qty 16

## 2021-09-29 MED ORDER — CAPECITABINE 500 MG PO TABS: 1000.0000 mg/m2 | ORAL_TABLET | Freq: Two times a day (BID) | ORAL | 1 refills | Status: DC

## 2021-09-29 NOTE — Patient Instructions (Signed)
[facility]  Discharge Instructions: Thank you for choosing [facility] to provide your oncology and hematology care.   If you have a lab appointment with the Cancer Center, please go directly to the Cancer Center and check in at the registration area.   Wear comfortable clothing and clothing appropriate for easy access to any Portacath or PICC line.   We strive to give you quality time with your provider. You may need to reschedule your appointment if you arrive late (15 or more minutes).  Arriving late affects you and other patients whose appointments are after yours.  Also, if you miss three or more appointments without notifying the office, you may be dismissed from the clinic at the provider's discretion.      For prescription refill requests, have your pharmacy contact our office and allow 72 hours for refills to be completed.    Today you received the following chemotherapy and/or immunotherapy agents: Oxaliplatin.       To help prevent nausea and vomiting after your treatment, we encourage you to take your nausea medication as directed.  BELOW ARE SYMPTOMS THAT SHOULD BE REPORTED IMMEDIATELY: *FEVER GREATER THAN 100.4 F (38 C) OR HIGHER *CHILLS OR SWEATING *NAUSEA AND VOMITING THAT IS NOT CONTROLLED WITH YOUR NAUSEA MEDICATION *UNUSUAL SHORTNESS OF BREATH *UNUSUAL BRUISING OR BLEEDING *URINARY PROBLEMS (pain or burning when urinating, or frequent urination) *BOWEL PROBLEMS (unusual diarrhea, constipation, pain near the anus) TENDERNESS IN MOUTH AND THROAT WITH OR WITHOUT PRESENCE OF ULCERS (sore throat, sores in mouth, or a toothache) UNUSUAL RASH, SWELLING OR PAIN  UNUSUAL VAGINAL DISCHARGE OR ITCHING   Items with * indicate a potential emergency and should be followed up as soon as possible or go to the Emergency Department if any problems should occur.  Please show the CHEMOTHERAPY ALERT CARD or IMMUNOTHERAPY ALERT CARD at check-in  to the Emergency Department and triage nurse.  Should you have questions after your visit or need to cancel or reschedule your appointment, please contact [facility]  Dept: [phone]  and follow the prompts.  Office hours are 8:00 a.m. to 4:30 p.m. Monday - Friday. Please note that voicemails left after 4:00 p.m. may not be returned until the following business day.  We are closed weekends and major holidays. You have access to a nurse at all times for urgent questions. Please call the main number to the clinic Dept: [phone] and follow the prompts.   For any non-urgent questions, you may also contact your provider using MyChart. We now offer e-Visits for anyone 18 and older to request care online for non-urgent symptoms. For details visit [email or web address].   Also download the MyChart app! Go to the app store, search "MyChart", open the app, select [facility], and log in with your MyChart username and password.  Due to Covid, a mask is required upon entering the hospital/clinic. If you do not have a mask, one will be given to you upon arrival. For doctor visits, patients may have 1 support person aged 18 or older with them. For treatment visits, patients cannot have anyone with them due to current Covid guidelines and our immunocompromised population.   

## 2021-09-29 NOTE — Progress Notes (Signed)
Received a msg from the nursing staff that pt needed assistance paying for Xarelto.  I gave the pt her portion to complete and received Dr. Ernestina Penna portion.  Once I receive the completed application and required docs from the pt I will fax to [employer] for processing.

## 2021-09-29 NOTE — Progress Notes (Signed)
[name]   [phone] [phone]   Clinic Follow up Note   Patient Care Team: [name], DO as PCP - General (Family Medicine) [name], MD as Consulting Physician (Hematology) Icard, Octavio Graves, DO as Consulting Physician (Pulmonary Disease) Colt, P.A. [name], MD as Consulting Physician (General Surgery) [name], MD as Consulting Physician (General Surgery) [name], [name], MD as Consulting Physician (Gastroenterology)  Date of Service:  09/29/2021  CHIEF COMPLAINT: f/u of rectal cancer  CURRENT THERAPY:  First-line chemo Xeloda 1500 mg twice daily for day 1-14, every 21 days, started on 02/01/2021. Reduced to 1 week on/ 1 week off and added bevacizumab q2weeks from C2 on 02/24/21. Given good tolerance, changed to CAPOX and bevacizumab q2weeks from C4 on 03/24/21 with Xeloda 1562m in the AM and 10034min the PM 1 week on/1 week off. Changed oxali to q3weeks and hold beva from C10 on 09/29/21  ASSESSMENT & PLAN:  [name] a [age].o. female with   1. [name], MMR normal, peritoneal metastasis in 01/2021 -diagnosed 02/2020, due to perforation underwent emergent surgery. Path showed invasive adenocarcinoma with perforation, +3/15 LNs, and clear margins. Initial CT scan negative for distant metastasis. -due to bowel perforation and + LNs she has very high risk for recurrence, unfortunately she was not a candidate for adjuvant chemo due to very slow recovery from surgery -Guardant Reveal ctDNA were all negative x3 (06/06/20, 07/12/20, and 09/10/20). -surveillance CT 08/27/20 showed a small density adjacent to the spleen that had decreased, no other evidence of recurrent or metastatic disease -first surveillance colonoscopy by Dr. ArHavery Moros/24/22 showed new adenocarcinoma at appendiceal orifice and other tubular adenomas throughout the colon.  Biopsy confirmed  adenocarcinoma -01/27/21 PET showed tumor in appendiceal orifice is likely peritoneal metastasis growing into the cecum.  Her peritoneal implants are mainly in the pelvis, difficult to biopsy. -She began single agent Xeloda with cycle 1 on 02/01/21.  Due to low performance status with cycle 2 was changed to 1 week on/1 week off, and beva was added. She tolerates this well -PS improved and low dose oxaliplatin was added with C4, now on CAPOX/beva q2 weeks, with Xeloda 1 week on/1 week off. -PET scan 05/08/21 showed good response to peritoneal metastasis, with the development of several FDG avid pulmonary nodules, and hypermetabolic left paratracheal node, the differential includes infectious/inflammatory versus thoracic metastasis.  -restaging CT CAP on 08/14/21 showed resolution of pulmonary nodules, stable mediastinal node, no clear radiographic metastatic disease. -labs reviewed, adequate to proceed with oxaliplatin alone today, continue to hold Beva due to her recent PE. We will also extend her oxali to q3weeks from this cycle since she has been on it for a while and slightly worse COPD lately    2. Bilateral PE, found on restaging CT CAP 08/14/21  -no hypoxia, stable respiratory status  -no need for inpatient management, she began xarelto 08/15/21 -hold Beva for 1-2 months given recent PE   3. COVID-19+ 05/21/21 and respiratory infection -she received CAPOX/beva on 05/19/21, tested positive on 05/21/21 -treated with paxlovid and subsequently doxycycline, augmentin, and prednisone -chest x-ray 06/23/21 showed only emphysema -resolved   4. H/o provoked RUE DVT 04/04/20 s/p 3 months AC (Eliquis)   5. Comorbidities: Asthma, COPD, anxiety -on nebulizer and trelegy, has not needed rescue inhaler recently -f/u with pulmonology and PCP -she has had increased SOB recently. Chest CT 09/15/21 was negative for PE.  She was prescribed doxycycline and prednisone on 09/24/21, breathing better.     PLAN: -proceed with  oxaliplatin alone today  -continue holding Beva -continue Xeloda, 1500 mg in AM and 1000 mg in PM, 7 days on/7 days off -lab, flush, f/u, and C12 Oxali in 3 weeks -will check the cost and financial acids for her anticoagulation (Xarelto, Eliquis and Lovenox) and restart one of them if she can afford   No problem-specific Assessment & Plan notes found for this encounter.   SUMMARY OF ONCOLOGIC HISTORY: Oncology History Overview Note  Cancer Staging Rectal cancer [facility]) Staging form: Colon and Rectum, AJCC 8th Edition - Pathologic stage from 03/03/2020: Stage IIIB (pT4a, pN1b, cM0) - Signed by [name], MD on 01/28/2021 Stage prefix: Initial diagnosis Histologic grading system: 4 grade system Histologic grade (G): G2 Residual tumor (R): R0 - None    Rectal cancer ([place])  03/03/2020 - 04/05/2020 Hospital Admission   She was admitted to ED on 03/03/20 for abdominal pain and nausea. She had been having diarrhea for several weeks. During hospital stay she developed acute respiratory failure, AKI, RUE DVT from PICC line. Work up showed bowel perforation, small left liver mass and thickening of jejunum. She underwent emergent surgery on 03/03/20 for resection and colostomy placement. Her path showed invasive cancer, metastatic to 3/15 LNs. She had a NGT in placed but this was removed on POD 3. Post op her stoma became necrotic and septic. She had another bowel surgery on 03/12/20, which was NED with necrotic tissue.    03/03/2020 Imaging   CT AP 03/03/20  IMPRESSION: 1. Free intraperitoneal air consistent perforation. Most likely site of perforation is in the distal splenic flexure/proximal descending colon where there is a collection of air and gas measuring 6 centimeters. No obvious soft tissue mass identified in this region. At this site, there is abrupt transition of dilated, stool-filled colon to completely decompressed proximal descending colon. 2. Thickened, inflamed loops of jejunum are  identified within the pelvis and are likely reactive. 3. Small hiatal hernia. 4. Benign-appearing 1.6 centimeter mass the LEFT hepatic lobe. Recommend comparison with prior studies if available. 5.  Emphysema (ICD10-J43.9). 6.  Aortic Atherosclerosis (ICD10-I70.0). 7. Bilateral renal scarring.   03/03/2020 Surgery   low anterior resection end colostomy by Dr Marcello Moores    03/03/2020 Initial Biopsy   FINAL MICROSCOPIC DIAGNOSIS: 03/03/20 A. RECTOSIGMOID COLON, LOW ANTERIOR RESECTION:  - Invasive colonic adenocarcinoma, 3.5 cm.  - Tumor invades the visceral peritoneum.  - Margins of resection are not involved.  - Metastatic carcinoma in (3) of (15) lymph nodes.  - See oncology table.   B. ADDITIONAL SIGMOID COLON, RESECTION:  - Colonic tissue, negative for carcinoma.  ADDENDUM:  Mismatch Repair Protein (IHC)   SUMMARY INTERPRETATION: NORMAL  There is preserved expression of the major MMR proteins. There is a very  low probability that microsatellite instability (MSI) is present.  However, certain clinically significant MMR protein mutations may result  in preservation of nuclear expression. It is recommended that the  preservation of protein expression be correlated with molecular based  MSI testing.   IHC EXPRESSION RESULTS  TEST           RESULT  MLH1:          Preserved nuclear expression  MSH2:          Preserved nuclear expression  MSH6:          Preserved nuclear expression  PMS2:  Preserved nuclear expression   03/03/2020 Cancer Staging   Staging form: Colon and Rectum, AJCC 8th Edition - Pathologic stage from 03/03/2020: Stage IIIB (pT4a, pN1b, cM0) - Signed by [name], MD on 01/28/2021 Stage prefix: Initial diagnosis Histologic grading system: 4 grade system Histologic grade (G): G2 Residual tumor (R): R0 - None   03/08/2020 Imaging   CT AP 03/08/20 IMPRESSION: 1. Interval midline laparotomy with distal colon resection and diverting left lower quadrant  colostomy. 2. Diffuse small bowel dilatation with gas fluid levels most consistent with postoperative ileus. 3. Trace ascites within the abdomen and pelvis. No fluid collection or abscess at this time. Surgical drain within the lower pelvis. 4. Indeterminate 1.4 cm subcapsular liver hypodensity. In light of newly diagnosed rectal cancer, metastatic disease cannot be excluded. PET CT may be useful for further evaluation. 5. Interval development of trace bilateral pleural effusions and diffuse body wall edema.   03/12/2020 Surgery   EXPLORATORY LAPAROTOMY WITH BOWEL RESECTION AND COLOSTOMY by Dr Hassell Done 03/12/20  FINAL MICROSCOPIC DIAGNOSIS: 03/12/20  A. COLON, SPLENIC FLEXURE, RESECTION:  - Segment of colon (37 cm) with perforation and associated inflammation  - Multiple mucosal ulcers with necrotizing inflammation  - No evidence of malignancy    03/12/2020 Imaging   CT AP WO contrast 03/12/20  IMPRESSION: 1. Exam is limited by lack of intravenous contrast, motion, and artifact from the patient's arms adjacent to the torso. Since 03/08/2020, there has been development of relatively large pockets of intraperitoneal free air. While intraperitoneal gas would not be unexpected on postoperative day 9, this gas is new since an intervening study of 03/08/2020 and given the relatively large volume certainly raises concern for bowel perforation. No source for the intraperitoneal free air is evident on this study. There is a surgical drain in the pelvis, but there is no free gas around the drain itself to suggest that it represents the source. 2. New circumferential wall thickening in the splenic flexure, descending colon and sigmoid colon leading into the end colostomy. Infection/inflammation would be a consideration. Ischemia cannot be excluded. 3. Relatively small volume intraperitoneal free fluid. High attenuation small fluid collections in the left upper abdomen may reflect hemorrhage,  infection or residua from prior perforation. 4. Interval progression of diffuse body wall edema. 5. Residual contrast material in the renal parenchyma from prior imaging, compatible with renal dysfunction.    03/19/2020 Imaging   CT CAP 03/19/20  IMPRESSION: 1. 5.5 x 3.2 cm fluid collection is noted along the greater curvature of the proximal stomach. 2. Surgical drain is again noted in the pelvis with tip in left lower quadrant. 3. Interval development of crescent-shaped fluid collection measuring 16.4 x 3.3 cm in the epigastric region and left upper quadrant of the abdomen which may extend into the left lower quadrant. Potentially this may represent abscess or developing abscess. Multiple other smaller fluid collections are noted which may represent small abscesses. 4. 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 5. Mild amount of free fluid is noted in the posterior pelvis. 6. Moderate anasarca is noted. Aortic Atherosclerosis (ICD10-I70.0).   04/12/2020 Imaging   CT AP 04/12/20  IMPRESSION: 1. Fluid collections within the LEFT abdomen have significantly decreased compared to previous CT exams, now nearly completely resolved. 2. Percutaneous drainage catheter with tip coiled posterior to the LEFT kidney, stable positioning compared to the previous study. The more anterior catheter has been removed. 3. Small amount of free fluid persists within the abdomen and pelvis. 4.  Trace bibasilar pleural effusions. 5. Anasarca. 6. While reviewing today's study, comparing with a chest/abdomen/pelvis CT from earlier same month, there is question of thrombus in the LEFT internal jugular vein. Recommend ultrasound of the LEFT IJ to exclude DVT. This recommendation discussed with patient's hospitalist, Dr. Owens Shark, on 04/12/2020 at 4:20 p.m.   Emphysema (ICD10-J43.9).   04/22/2020 Imaging   CT AP 04/22/20  IMPRESSION: 1. No recurrent intra-abdominal abscess status post interval removal of  left retroperitoneal percutaneous drain. Stable small amount of free pelvic fluid. 2. Enlarging dependent bilateral pleural effusions, now moderate in volume. Associated atelectasis at both lung bases. 3. Progressive anasarca with generalized edema throughout the subcutaneous fat. 4. Stable small subcapsular fluid collection along the anterior aspect of the left hepatic lobe. 5. Aortic Atherosclerosis (ICD10-I70.0).   05/23/2020 Initial Diagnosis   Rectal cancer ([place])   01/14/2021 Imaging   CT CAP IMPRESSION: -5.0 cm soft tissue mass along the posterior aspect of the cecum at the base of the appendix, favored to reflect a peritoneal/serosal implant, corresponding to the patient's known adenocarcinoma. This is new from the prior. -Suspected additional pelvic implants along the uterus and right adnexa, poorly visualized, new/progressive from the prior. -Additional pericapsular lesion along the lateral spleen is mildly progressive, suspicious for additional peritoneal implant. -No evidence of metastatic disease in the chest.   01/27/2021 PET scan   IMPRESSION:  1. Extensive hypermetabolic peritoneal metastasis, as detailed  above.  2. No evidence of hypermetabolic supradiaphragmatic disease.  3. Diffuse low-level thyroid hypermetabolism can be seen in the  setting of thyroiditis. Consider correlation with thyroid function  labs.  4. Aortic atherosclerosis (ICD10-I70.0) and emphysema (ICD10-J43.9).    02/01/2021 -  Chemotherapy    First-line chemo Xeloda 1500 mg twice daily for day 1-14, every 21 days, started on 02/01/2021. Reduced to 1 week on/ 1 week off and added bevacizumab q2weeks from C2 on 02/24/21.  ----Given good tolerance, I changed to CAPOX and bevacizumab q2weeks from C4 on 03/24/21 with Xeloda 1572m in the AM and 10031min the PM 1 week on/1 week off.     05/08/2021 Imaging   PET  IMPRESSION: 1. There is been interval development of several FDG avid pulmonary nodules  within both lungs. Additionally, there is new FDG avid low left paratracheal lymph node. Thoracic metastasis cannot be excluded. 2. Interval improvement in multifocal FDG avid peritoneal metastasis as detailed above.     08/14/2021 Imaging   CT CAP  IMPRESSION: Resolution of previously seen small bilateral pulmonary nodules since previous study.   Stable 8 mm mediastinal lymph node in the AP window.   No evidence of metastatic disease within the abdomen or pelvis.   Nonocclusive pulmonary embolism in bilateral central lower lobe pulmonary arteries, which appears subacute in age.   [place] 01/06/20[place] Procedure   (surveillance colonoscopy for h/o rectal cancer) Impression:  - Preparation of the colon was fair, lavage performed with mostly adequate views. - Polypoid lesion obliterating appendiceal orifice. Biopsied to evaluate for malignancy. - The examined portion of the ileum was normal. - Two large polyps in the cecum, not removed today pending path at appendiceal orifice, bowel prep. If removed endoscopically in the future would favor EMR to be done at the hospital. - One 5 mm polyp at the ileocecal valve, removed with a cold snare. Resected and retrieved. - One 3 mm polyp in the transverse colon, removed with a cold snare. Resected and retrieved. - Parastomal hernia making  initial entry to the distal colon challenging. - The examination was otherwise normal.   01/06/2021 Initial Biopsy   Diagnosis 1. Colon, biopsy, appendiceal oraface - ADENOCARCINOMA. 2. Colon, polyp(s), ileocecal valve, transverse, x2 - TUBULAR ADENOMA, NEGATIVE FOR HIGH GRADE DYSPLASIA (X1). - COLONIC MUCOSA WITH UNDERLYING LYMPHOID AGGREGATE, NEGATIVE FOR DYSPLASIA (X1).  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 This is new from the prior. -Suspected additional pelvic implants along the uterus and right adnexa, poorly visualized, new/progressive from the prior. -Additional pericapsular lesion along the lateral spleen is mildly progressive, suspicious for additional peritoneal implant. -No evidence of metastatic disease in the chest.   08/14/2021 Imaging   CT CAP IMPRESSION: Resolution of previously seen small bilateral pulmonary nodules since previous study. Stable 8 mm mediastinal lymph node in the AP window. No evidence of metastatic disease within the abdomen or pelvis. Nonocclusive pulmonary embolism in bilateral central lower lobe pulmonary arteries, which appears subacute in age.  Aortic Atherosclerosis (ICD10-I70.0) and Emphysema (ICD10-J43.9).     08/14/2021 Imaging   CT CAP  IMPRESSION: Resolution of previously seen small bilateral pulmonary nodules since previous study.   Stable 8 mm mediastinal lymph node in the AP window.   No evidence of metastatic disease within the abdomen or pelvis.   Nonocclusive pulmonary embolism in bilateral central lower lobe pulmonary arteries, which appears subacute in age.      INTERVAL HISTORY:  Tracy [name] is here for a follow up of rectal cancer. She was last seen by me on 09/15/21. She presents to the clinic alone. She reports her breathing is better. She was prescribed multiple medications by Dr. Valeta Harms on 09/24/21. She notes she began this cycle of Xeloda this morning.   All other systems were reviewed with the patient and are negative.  MEDICAL HISTORY:  Past Medical History:  Diagnosis Date   Acute deep vein thrombosis (DVT) of brachial vein of right upper extremity (HCC)    Acute on chronic respiratory failure with hypoxia (HCC)    Asthma    Cataract    CHICKENPOX, HX OF 01/06/2011   Qualifier: Diagnosis of   By: Charlett Blake MD, Stacey     COPD (chronic obstructive pulmonary disease) ([place]) 2011   FeV1 31% predicted FeV1/FVX 47 %   COVID-19 05/2021   Essential hypertension 05/23/2020   Hemorrhoid    Open right radial fracture 2020   Osteopenia    Perforated sigmoid colon ([place]) 03/03/2020   Pleural effusion    Pneumonia 2021   Postoperative intra-abdominal abscess 2021   Rectal cancer (HCC)    Seasonal allergies    takes Claritin daily prn   Shock circulatory ([place])    Tracheostomy status ([place])    Vitamin D deficiency    takes Vit d every 14 days    SURGICAL HISTORY: Past Surgical History:  Procedure Laterality Date   AUGMENTATION MAMMAPLASTY     saline   EXAMINATION UNDER ANESTHESIA  10/14/2012   Procedure: EXAM UNDER ANESTHESIA;  Surgeon: Gayland Curry, MD,FACS;  Location: [facility];  Service: General;  Laterality: N/A;  rectal exam under anesthesia excisional hemorrhoidectomy hemorrhoidal banding x two   EXAMINATION UNDER ANESTHESIA  02/03/2013   excision hemorrhoidal tissue   FOOT  SURGERY     left bunionectomy   HEMORRHOIDECTOMY WITH HEMORRHOID BANDING  10/14/2012   Procedure: HEMORRHOIDECTOMY WITH HEMORRHOID BANDING;  Surgeon: Gayland Curry, MD,FACS;  Location: [facility];  Service: General;  Laterality: N/A;   IR IMAGING GUIDED PORT INSERTION  02/03/2021   IR THORACENTESIS ASP PLEURAL SPACE W/IMG GUIDE  04/23/2020   LAPAROTOMY N/A 03/03/2020   Procedure: low anterior resection end colostomy;  Surgeon: [name], MD;  Location: [facility];  Service: General;  Laterality: N/A;   LAPAROTOMY N/A 03/12/2020   Procedure: EXPLORATORY LAPAROTOMY WITH BOWEL RESECTION AND COLOSTOMY;  Surgeon: [name], MD;  Location: [facility];  Service: General;  Laterality: N/A;   OPEN REDUCTION INTERNAL FIXATION (ORIF) DISTAL RADIAL FRACTURE Right 11/01/2018   Procedure: OPEN REDUCTION INTERNAL FIXATION (ORIF) DISTAL RADIAL FRACTURE;  Surgeon: [name], MD;  Location: [facility];  Service:  Orthopedics;  Laterality: Right;   TONSILLECTOMY  1960   recurrent otitis media   US ECHOCARDIOGRAPHY  02/2020    poor windows, normal LV function, severely dilated RV with moderately reduced function, RV volume and pressure overload, mildly dilated RA    I have reviewed the social history and family history with the patient and they are unchanged from previous note.  ALLERGIES:  is allergic to amlodipine and codeine.  MEDICATIONS:  Current Outpatient Medications  Medication Sig Dispense Refill   albuterol (PROVENTIL) (2.5 MG/3ML) 0.083% nebulizer solution Take 3 mLs (2.5 mg total) by nebulization every 4 (four) hours as needed for wheezing or shortness of breath. DX: J44.9 360 mL 5   albuterol (VENTOLIN HFA) 108 (90 Base) MCG/ACT inhaler Inhale 2 puffs into the lungs every 6 (six) hours as needed for wheezing or shortness of breath. 8 g 5   ALPRAZolam (XANAX) 0.25 MG tablet Take 1 tablet (0.25 mg total) by mouth at bedtime as needed for anxiety. 30 tablet 0   capecitabine (XELODA) 500 MG tablet Take 3 tablets (1,500 mg total) by mouth 2 (two) times daily after a meal. Take for 7 days on, 7 days off, repeat every 14 days. 84 tablet 1   diphenhydrAMINE (BENADRYL) 25 mg capsule Take 25 mg by mouth every 6 (six) hours as needed.     docusate sodium (COLACE) 50 MG capsule Take 50 mg by mouth daily as needed for mild constipation.     doxycycline (VIBRA-TABS) 100 MG tablet Take 1 tablet (100 mg total) by mouth 2 (two) times daily. 14 tablet 0   fluticasone (FLONASE) 50 MCG/ACT nasal spray Place 2 sprays into both nostrils daily. 16 mL 5   Fluticasone-Umeclidin-Vilant (TRELEGY ELLIPTA) 100-62.5-25 MCG/INH AEPB Inhale 1 puff into the lungs daily. 60 each 5   HYDROcodone bit-homatropine (HYCODAN) 5-1.5 MG/5ML syrup Take 5 mLs by mouth every 6 (six) hours as needed for cough. 240 mL 0   HYDROcodone bit-homatropine (HYDROMET) 5-1.5 MG/5ML syrup Take 5 mLs by mouth every 6 (six) hours as needed for  cough. 120 mL 0   lidocaine-prilocaine (EMLA) cream Apply 1 application topically as needed. 30 g 0   ondansetron (ZOFRAN) 8 MG tablet Take 1 tablet (8 mg total) by mouth every 8 (eight) hours as needed for nausea or vomiting. 20 tablet 2   predniSONE (DELTASONE) 10 MG tablet Take 4 tabs by mouth once daily x4 days, then 3 tabs x4 days, 2 tabs x4 days, 1 tab x4 days and stop. 40 tablet 0   prochlorperazine (COMPAZINE) 10 MG tablet Take 1 tablet (10 mg  total) by mouth every 6 (six) hours as needed for nausea or vomiting. 30 tablet 2   rivaroxaban (XARELTO) 20 MG TABS tablet Take 1 tablet (20 mg total) by mouth daily with supper. 30 tablet 0   traMADol (ULTRAM) 50 MG tablet Take 1 tablet (50 mg total) by mouth every 6 (six) hours as needed. 30 tablet 0   No current facility-administered medications for this visit.   Facility-Administered Medications Ordered in Other Visits  Medication Dose Route Frequency Provider Last Rate Last Admin   dexamethasone (DECADRON) 10 mg in sodium chloride 0.9 % 50 mL IVPB  10 mg Intravenous Once [name], MD       dextrose 5 % solution   Intravenous Once [name], MD       heparin lock flush 100 unit/mL  500 Units Intracatheter Once PRN [name], MD       oxaliplatin (ELOXATIN) 80 mg in dextrose 5 % 500 mL chemo infusion  50 mg/m2 (Treatment Plan Recorded) Intravenous Once [name], MD       palonosetron (ALOXI) injection 0.25 mg  0.25 mg Intravenous Once [name], MD       sodium chloride flush (NS) 0.9 % injection 10 mL  10 mL Intracatheter PRN [name], MD        PHYSICAL EXAMINATION: ECOG PERFORMANCE STATUS: 2 - Symptomatic, <50% confined to bed  Vitals:   09/29/21 1042  BP: (!) 150/76  Pulse: 93  Resp: 20  Temp: 97.8 F (36.6 C)  SpO2: 98%   Wt Readings from Last 3 Encounters:  09/29/21 118 lb 14.4 oz (53.9 kg)  09/24/21 120 lb 12.8 oz (54.8 kg)  09/15/21 117 lb 3.2 oz (53.2 kg)     GENERAL:alert, no distress and comfortable SKIN: skin  color, texture, turgor are normal, no rashes or significant lesions EYES: normal, Conjunctiva are pink and non-injected, sclera clear  NECK: supple, thyroid normal size, non-tender, without nodularity LYMPH:  no palpable lymphadenopathy in the cervical, axillary  LUNGS: clear to auscultation and percussion with normal breathing effort HEART: regular rate & rhythm and no murmurs and no lower extremity edema ABDOMEN:abdomen soft, non-tender and normal bowel sounds Musculoskeletal:no cyanosis of digits and no clubbing  NEURO: alert & oriented x 3 with fluent speech, no focal motor/sensory deficits  LABORATORY DATA:  I have reviewed the data as listed CBC Latest Ref Rng & Units 09/29/2021 09/15/2021 09/01/2021  WBC 4.0 - 10.5 K/uL 6.7 6.5 8.1  Hemoglobin 12.0 - 15.0 g/dL 12.3 12.5 12.7  Hematocrit 36.0 - 46.0 % 37.5 37.8 38.3  Platelets 150 - 400 K/uL 233 224 233     CMP Latest Ref Rng & Units 09/29/2021 09/15/2021 09/01/2021  Glucose 70 - 99 mg/dL 91 107(H) 112(H)  BUN 8 - 23 mg/dL 13 16 16   Creatinine 0.44 - 1.00 mg/dL 0.81 0.83 0.85  Sodium 135 - 145 mmol/L 140 140 138  Potassium 3.5 - 5.1 mmol/L 4.1 3.2(L) 3.7  Chloride 98 - 111 mmol/L 105 105 103  CO2 22 - 32 mmol/L 24 23 23   Calcium 8.9 - 10.3 mg/dL 9.5 9.7 9.3  Total Protein 6.5 - 8.1 g/dL 6.9 7.0 7.0  Total Bilirubin 0.3 - 1.2 mg/dL 0.7 0.7 0.5  Alkaline Phos 38 - 126 U/L 132(H) 114 106  AST 15 - 41 U/L 22 22 25   ALT 0 - 44 U/L 11 12 15       RADIOGRAPHIC STUDIES: I have personally reviewed the radiological images as  listed and agreed with the findings in the report. No results found.    No orders of the defined types were placed in this encounter.  All questions were answered. The patient knows to call the clinic with any problems, questions or concerns. No barriers to learning was detected. The total time spent in the appointment was 30 minutes.     [name], MD 09/29/2021   I, Wilburn Mylar, am acting as  scribe for [name], MD.   I have reviewed the above documentation for accuracy and completeness, and I agree with the above.

## 2021-10-06 ENCOUNTER — Telehealth: Payer: Self-pay

## 2021-10-06 NOTE — Telephone Encounter (Signed)
Pt called indicating that she decided she will pay out of pocket for her Xarelto rather than applying for financial assistance.  Pt requested to have prescription sent to [facility].  Informed pt that the [facility] on [facility].  Pt stated she and her husband will pick up the prescription from [facility] today.

## 2021-10-07 DIAGNOSIS — H35413 Lattice degeneration of retina, bilateral: Secondary | ICD-10-CM | POA: Diagnosis not present

## 2021-10-07 DIAGNOSIS — H04123 Dry eye syndrome of bilateral lacrimal glands: Secondary | ICD-10-CM | POA: Diagnosis not present

## 2021-10-07 DIAGNOSIS — Z9889 Other specified postprocedural states: Secondary | ICD-10-CM | POA: Diagnosis not present

## 2021-10-07 DIAGNOSIS — H25813 Combined forms of age-related cataract, bilateral: Secondary | ICD-10-CM | POA: Diagnosis not present

## 2021-10-17 ENCOUNTER — Encounter: Payer: Self-pay | Admitting: Hematology

## 2021-10-17 ENCOUNTER — Other Ambulatory Visit: Payer: Self-pay

## 2021-10-17 ENCOUNTER — Encounter (INDEPENDENT_AMBULATORY_CARE_PROVIDER_SITE_OTHER): Payer: Medicare HMO | Admitting: Ophthalmology

## 2021-10-17 DIAGNOSIS — H2513 Age-related nuclear cataract, bilateral: Secondary | ICD-10-CM

## 2021-10-17 DIAGNOSIS — H33303 Unspecified retinal break, bilateral: Secondary | ICD-10-CM | POA: Diagnosis not present

## 2021-10-17 DIAGNOSIS — H35413 Lattice degeneration of retina, bilateral: Secondary | ICD-10-CM | POA: Diagnosis not present

## 2021-10-17 DIAGNOSIS — H43813 Vitreous degeneration, bilateral: Secondary | ICD-10-CM

## 2021-10-17 MED FILL — Dexamethasone Sodium Phosphate Inj 100 MG/10ML: INTRAMUSCULAR | Qty: 1 | Status: AC

## 2021-10-20 ENCOUNTER — Inpatient Hospital Stay: Payer: Medicare HMO | Attending: Hematology

## 2021-10-20 ENCOUNTER — Inpatient Hospital Stay (HOSPITAL_BASED_OUTPATIENT_CLINIC_OR_DEPARTMENT_OTHER): Payer: Medicare HMO | Admitting: Hematology

## 2021-10-20 ENCOUNTER — Other Ambulatory Visit: Payer: Self-pay

## 2021-10-20 ENCOUNTER — Encounter: Payer: Self-pay | Admitting: Hematology

## 2021-10-20 ENCOUNTER — Inpatient Hospital Stay: Payer: Medicare HMO

## 2021-10-20 DIAGNOSIS — C20 Malignant neoplasm of rectum: Secondary | ICD-10-CM

## 2021-10-20 DIAGNOSIS — Z7901 Long term (current) use of anticoagulants: Secondary | ICD-10-CM | POA: Diagnosis not present

## 2021-10-20 DIAGNOSIS — Z5111 Encounter for antineoplastic chemotherapy: Secondary | ICD-10-CM | POA: Diagnosis present

## 2021-10-20 DIAGNOSIS — Z79899 Other long term (current) drug therapy: Secondary | ICD-10-CM | POA: Diagnosis not present

## 2021-10-20 DIAGNOSIS — Z86711 Personal history of pulmonary embolism: Secondary | ICD-10-CM | POA: Insufficient documentation

## 2021-10-20 DIAGNOSIS — Z7952 Long term (current) use of systemic steroids: Secondary | ICD-10-CM | POA: Diagnosis not present

## 2021-10-20 DIAGNOSIS — R69 Illness, unspecified: Secondary | ICD-10-CM | POA: Diagnosis not present

## 2021-10-20 DIAGNOSIS — I82621 Acute embolism and thrombosis of deep veins of right upper extremity: Secondary | ICD-10-CM | POA: Diagnosis not present

## 2021-10-20 DIAGNOSIS — J90 Pleural effusion, not elsewhere classified: Secondary | ICD-10-CM | POA: Diagnosis not present

## 2021-10-20 DIAGNOSIS — Z86718 Personal history of other venous thrombosis and embolism: Secondary | ICD-10-CM | POA: Diagnosis not present

## 2021-10-20 DIAGNOSIS — C181 Malignant neoplasm of appendix: Secondary | ICD-10-CM | POA: Insufficient documentation

## 2021-10-20 DIAGNOSIS — J9600 Acute respiratory failure, unspecified whether with hypoxia or hypercapnia: Secondary | ICD-10-CM | POA: Diagnosis not present

## 2021-10-20 DIAGNOSIS — C799 Secondary malignant neoplasm of unspecified site: Secondary | ICD-10-CM

## 2021-10-20 DIAGNOSIS — R1310 Dysphagia, unspecified: Secondary | ICD-10-CM | POA: Diagnosis not present

## 2021-10-20 DIAGNOSIS — E43 Unspecified severe protein-calorie malnutrition: Secondary | ICD-10-CM | POA: Diagnosis not present

## 2021-10-20 DIAGNOSIS — M8580 Other specified disorders of bone density and structure, unspecified site: Secondary | ICD-10-CM | POA: Insufficient documentation

## 2021-10-20 DIAGNOSIS — J449 Chronic obstructive pulmonary disease, unspecified: Secondary | ICD-10-CM | POA: Diagnosis not present

## 2021-10-20 DIAGNOSIS — M6281 Muscle weakness (generalized): Secondary | ICD-10-CM | POA: Diagnosis not present

## 2021-10-20 LAB — CBC WITH DIFFERENTIAL (CANCER CENTER ONLY)
Abs Immature Granulocytes: 0.01 10*3/uL (ref 0.00–0.07)
Basophils Absolute: 0 10*3/uL (ref 0.0–0.1)
Basophils Relative: 1 %
Eosinophils Absolute: 0 10*3/uL (ref 0.0–0.5)
Eosinophils Relative: 1 %
HCT: 37.6 % (ref 36.0–46.0)
Hemoglobin: 12.4 g/dL (ref 12.0–15.0)
Immature Granulocytes: 0 %
Lymphocytes Relative: 30 %
Lymphs Abs: 1.5 10*3/uL (ref 0.7–4.0)
MCH: 34.1 pg — ABNORMAL HIGH (ref 26.0–34.0)
MCHC: 33 g/dL (ref 30.0–36.0)
MCV: 103.3 fL — ABNORMAL HIGH (ref 80.0–100.0)
Monocytes Absolute: 0.4 10*3/uL (ref 0.1–1.0)
Monocytes Relative: 9 %
Neutro Abs: 3 10*3/uL (ref 1.7–7.7)
Neutrophils Relative %: 59 %
Platelet Count: 248 10*3/uL (ref 150–400)
RBC: 3.64 MIL/uL — ABNORMAL LOW (ref 3.87–5.11)
RDW: 13.7 % (ref 11.5–15.5)
WBC Count: 5 10*3/uL (ref 4.0–10.5)
nRBC: 0 % (ref 0.0–0.2)

## 2021-10-20 LAB — CMP (CANCER CENTER ONLY)
ALT: 13 U/L (ref 0–44)
AST: 20 U/L (ref 15–41)
Albumin: 3.9 g/dL (ref 3.5–5.0)
Alkaline Phosphatase: 120 U/L (ref 38–126)
Anion gap: 10 (ref 5–15)
BUN: 17 mg/dL (ref 8–23)
CO2: 23 mmol/L (ref 22–32)
Calcium: 9.2 mg/dL (ref 8.9–10.3)
Chloride: 105 mmol/L (ref 98–111)
Creatinine: 0.81 mg/dL (ref 0.44–1.00)
GFR, Estimated: >60 mL/min (ref >60)
Glucose, Bld: 98 mg/dL (ref 70–99)
Potassium: 4 mmol/L (ref 3.5–5.1)
Sodium: 138 mmol/L (ref 135–145)
Total Bilirubin: 0.7 mg/dL (ref 0.3–1.2)
Total Protein: 7.2 g/dL (ref 6.5–8.1)

## 2021-10-20 LAB — CEA (IN HOUSE-CHCC): CEA (CHCC-In House): 3.56 ng/mL (ref 0.00–5.00)

## 2021-10-20 MED ORDER — CAPECITABINE 500 MG PO TABS: 1000.0000 mg/m2 | ORAL_TABLET | Freq: Two times a day (BID) | ORAL | 1 refills | Status: DC

## 2021-10-20 MED ORDER — SODIUM CHLORIDE 0.9 % IV SOLN
10.0000 mg | Freq: Once | INTRAVENOUS | Status: AC
  Administered 2021-10-20: 10 mg via INTRAVENOUS
  Filled 2021-10-20: qty 10
  Filled 2021-10-20: qty 1

## 2021-10-20 MED ORDER — OXALIPLATIN CHEMO INJECTION 100 MG/20ML
50.0000 mg/m2 | Freq: Once | INTRAVENOUS | Status: AC
  Administered 2021-10-20: 80 mg via INTRAVENOUS
  Filled 2021-10-20: qty 16

## 2021-10-20 MED ORDER — DEXTROSE 5 % IV SOLN: Freq: Once | INTRAVENOUS | Status: AC

## 2021-10-20 MED ORDER — SODIUM CHLORIDE 0.9% FLUSH
10.0000 mL | Freq: Once | INTRAVENOUS | Status: AC
  Administered 2021-10-20: 10 mL

## 2021-10-20 MED ORDER — SODIUM CHLORIDE 0.9% FLUSH
10.0000 mL | INTRAVENOUS | Status: DC | PRN
  Administered 2021-10-20: 10 mL

## 2021-10-20 MED ORDER — HEPARIN SOD (PORK) LOCK FLUSH 100 UNIT/ML IV SOLN
500.0000 [IU] | Freq: Once | INTRAVENOUS | Status: AC | PRN
  Administered 2021-10-20: 500 [IU]

## 2021-10-20 MED ORDER — PALONOSETRON HCL INJECTION 0.25 MG/5ML
0.2500 mg | Freq: Once | INTRAVENOUS | Status: AC
  Administered 2021-10-20: 0.25 mg via INTRAVENOUS
  Filled 2021-10-20: qty 5

## 2021-10-20 NOTE — Patient Instructions (Signed)
[facility]  Discharge Instructions: Thank you for choosing [facility] to provide your oncology and hematology care.   If you have a lab appointment with the Cancer Center, please go directly to the Cancer Center and check in at the registration area.   Wear comfortable clothing and clothing appropriate for easy access to any Portacath or PICC line.   We strive to give you quality time with your provider. You may need to reschedule your appointment if you arrive late (15 or more minutes).  Arriving late affects you and other patients whose appointments are after yours.  Also, if you miss three or more appointments without notifying the office, you may be dismissed from the clinic at the provider's discretion.      For prescription refill requests, have your pharmacy contact our office and allow 72 hours for refills to be completed.    Today you received the following chemotherapy and/or immunotherapy agents: Oxaliplatin.       To help prevent nausea and vomiting after your treatment, we encourage you to take your nausea medication as directed.  BELOW ARE SYMPTOMS THAT SHOULD BE REPORTED IMMEDIATELY: *FEVER GREATER THAN 100.4 F (38 C) OR HIGHER *CHILLS OR SWEATING *NAUSEA AND VOMITING THAT IS NOT CONTROLLED WITH YOUR NAUSEA MEDICATION *UNUSUAL SHORTNESS OF BREATH *UNUSUAL BRUISING OR BLEEDING *URINARY PROBLEMS (pain or burning when urinating, or frequent urination) *BOWEL PROBLEMS (unusual diarrhea, constipation, pain near the anus) TENDERNESS IN MOUTH AND THROAT WITH OR WITHOUT PRESENCE OF ULCERS (sore throat, sores in mouth, or a toothache) UNUSUAL RASH, SWELLING OR PAIN  UNUSUAL VAGINAL DISCHARGE OR ITCHING   Items with * indicate a potential emergency and should be followed up as soon as possible or go to the Emergency Department if any problems should occur.  Please show the CHEMOTHERAPY ALERT CARD or IMMUNOTHERAPY ALERT CARD at check-in  to the Emergency Department and triage nurse.  Should you have questions after your visit or need to cancel or reschedule your appointment, please contact [facility]  Dept: [phone]  and follow the prompts.  Office hours are 8:00 a.m. to 4:30 p.m. Monday - Friday. Please note that voicemails left after 4:00 p.m. may not be returned until the following business day.  We are closed weekends and major holidays. You have access to a nurse at all times for urgent questions. Please call the main number to the clinic Dept: [phone] and follow the prompts.   For any non-urgent questions, you may also contact your provider using MyChart. We now offer e-Visits for anyone 18 and older to request care online for non-urgent symptoms. For details visit [email or web address].   Also download the MyChart app! Go to the app store, search "MyChart", open the app, select [facility], and log in with your MyChart username and password.  Due to Covid, a mask is required upon entering the hospital/clinic. If you do not have a mask, one will be given to you upon arrival. For doctor visits, patients may have 1 support person aged 18 or older with them. For treatment visits, patients cannot have anyone with them due to current Covid guidelines and our immunocompromised population.   

## 2021-10-20 NOTE — Patient Instructions (Signed)

## 2021-10-20 NOTE — Progress Notes (Addendum)
[place]   [phone] [phone]   Clinic Follow up Note   Patient Care Team: [name], Tracy [name] as PCP - General (Family Medicine) Tracy Merle, Tracy [name] as Consulting Physician (Hematology) Icard, Tracy Graves, Tracy [name] as Consulting Physician (Pulmonary Disease) Tracy [name], P.A. Tracy Hausen, Tracy [name] as Consulting Physician (General Surgery) Tracy Ruff, Tracy [name] as Consulting Physician (General Surgery) [name], Tracy Raspberry, Tracy [name] as Consulting Physician (Gastroenterology)  Date of Service:  10/20/2021  CHIEF COMPLAINT: f/u of rectal cancer  CURRENT THERAPY:  First-line chemo Xeloda 1500 mg twice daily for day 1-14, every 21 days, started on 02/01/2021. Reduced to 1 week on/ 1 week off and added bevacizumab q2weeks from C2 on 02/24/21. Given good tolerance, changed to CAPOX and bevacizumab q2weeks from C4 on 03/24/21 with Xeloda 1571m in the AM and 10022min the PM 1 week on/1 week off. Changed oxali to q3weeks and hold beva from C10 on 09/29/21  ASSESSMENT & PLAN:  DoSHREYA [name] a [age].o. female with   1. [name], MMR normal, peritoneal metastasis in 01/2021 -diagnosed 02/2020, due to perforation underwent emergent surgery. Path showed invasive adenocarcinoma with perforation, +3/15 LNs, and clear margins. Initial CT scan negative for distant metastasis. -due to bowel perforation and + LNs she has very high risk for recurrence, unfortunately she was not a candidate for adjuvant chemo due to very slow recovery from surgery -Guardant Reveal ctDNA were all negative x3 (06/06/20, 07/12/20, and 09/10/20). -surveillance CT 08/27/20 showed a small density adjacent to the spleen that had decreased, no other evidence of recurrent or metastatic disease -first surveillance colonoscopy by Dr. ArHavery Moros/24/22 showed new adenocarcinoma at appendiceal orifice and other tubular adenomas throughout the colon.  Biopsy confirmed  adenocarcinoma -01/27/21 PET showed tumor in appendiceal orifice is likely peritoneal metastasis growing into the cecum.  Her peritoneal implants are mainly in the pelvis, difficult to biopsy. -She began single agent Xeloda with cycle 1 on 02/01/21.  Due to low performance status with cycle 2 was changed to 1 week on/1 week off, and beva was added. She tolerates this well -PS improved and low dose oxaliplatin was added with C4, now on CAPOX/beva q2 weeks, with Xeloda 1 week on/1 week off. -PET scan 05/08/21 showed good response to peritoneal metastasis, with the development of several FDG avid pulmonary nodules, and hypermetabolic left paratracheal node, the differential includes infectious/inflammatory versus thoracic metastasis.  -restaging CT CAP on 08/14/21 showed resolution of pulmonary nodules, stable mediastinal node, no clear radiographic metastatic disease. -labs reviewed, adequate to proceed with oxaliplatin alone today, continue to hold Beva due to her recent PE. She is now receiving oxali every 3 weeks. -plan to repeat restaging scan in mid Dec    2. Bilateral PE, found on restaging CT CAP 08/14/21  -no hypoxia, stable respiratory status  -no need for inpatient management, she began xarelto 08/15/21. She continues despite the cost. -hold Beva given recent PE, plan to restart after Christmas   3. COVID-19+ 05/21/21 and respiratory infection -she received CAPOX/beva on 05/19/21, tested positive on 05/21/21 -treated with paxlovid and subsequently doxycycline, augmentin, and prednisone -chest x-ray 06/23/21 showed only emphysema -resolved   4. H/o provoked RUE DVT 04/04/20 s/p 3 months AC (Eliquis)   5. Comorbidities: Asthma, COPD, anxiety -on nebulizer and trelegy, has not needed rescue inhaler recently -f/u with pulmonology and PCP -she has had increased SOB recently. Chest CT 09/15/21 was negative for PE. She was  prescribed doxycycline and prednisone on 09/24/21, breathing better.      PLAN: -proceed with oxaliplatin alone today             -continue holding Beva due to recent PE -continue Xeloda, 1500 mg in AM and 1000 mg in PM, 7 days on/7 days off -lab, flush, f/u, and Oxali in 3 weeks, will order restaging CT on next visit    No problem-specific Assessment & Plan notes found for this encounter.   SUMMARY OF ONCOLOGIC HISTORY: Oncology History Overview Note  Cancer Staging Rectal cancer [facility]) Staging form: Colon and Rectum, AJCC 8th Edition - Pathologic stage from 03/03/2020: Stage IIIB (pT4a, pN1b, cM0) - Signed by Tracy Merle, Tracy [name] on 01/28/2021 Stage prefix: Initial diagnosis Histologic grading system: 4 grade system Histologic grade (G): G2 Residual tumor (R): R0 - None    Rectal cancer ([place])  03/03/2020 - 04/05/2020 Hospital Admission   She was admitted to ED on 03/03/20 for abdominal pain and nausea. She had been having diarrhea for several weeks. During hospital stay she developed acute respiratory failure, AKI, RUE DVT from PICC line. Work up showed bowel perforation, small left liver mass and thickening of jejunum. She underwent emergent surgery on 03/03/20 for resection and colostomy placement. Her path showed invasive cancer, metastatic to 3/15 LNs. She had a NGT in placed but this was removed on POD 3. Post op her stoma became necrotic and septic. She had another bowel surgery on 03/12/20, which was NED with necrotic tissue.    03/03/2020 Imaging   CT AP 03/03/20  IMPRESSION: 1. Free intraperitoneal air consistent perforation. Most likely site of perforation is in the distal splenic flexure/proximal descending colon where there is a collection of air and gas measuring 6 centimeters. No obvious soft tissue mass identified in this region. At this site, there is abrupt transition of dilated, stool-filled colon to completely decompressed proximal descending colon. 2. Thickened, inflamed loops of jejunum are identified within the pelvis and are likely  reactive. 3. Small hiatal hernia. 4. Benign-appearing 1.6 centimeter mass the LEFT hepatic lobe. Recommend comparison with prior studies if available. 5.  Emphysema (ICD10-J43.9). 6.  Aortic Atherosclerosis (ICD10-I70.0). 7. Bilateral renal scarring.   03/03/2020 Surgery   low anterior resection end colostomy by Dr Marcello Moores    03/03/2020 Initial Biopsy   FINAL MICROSCOPIC DIAGNOSIS: 03/03/20 A. RECTOSIGMOID COLON, LOW ANTERIOR RESECTION:  - Invasive colonic adenocarcinoma, 3.5 cm.  - Tumor invades the visceral peritoneum.  - Margins of resection are not involved.  - Metastatic carcinoma in (3) of (15) lymph nodes.  - See oncology table.   B. ADDITIONAL SIGMOID COLON, RESECTION:  - Colonic tissue, negative for carcinoma.  ADDENDUM:  Mismatch Repair Protein (IHC)   SUMMARY INTERPRETATION: NORMAL  There is preserved expression of the major MMR proteins. There is a very  low probability that microsatellite instability (MSI) is present.  However, certain clinically significant MMR protein mutations may result  in preservation of nuclear expression. It is recommended that the  preservation of protein expression be correlated with molecular based  MSI testing.   IHC EXPRESSION RESULTS  TEST           RESULT  MLH1:          Preserved nuclear expression  MSH2:          Preserved nuclear expression  MSH6:          Preserved nuclear expression  PMS2:  Preserved nuclear expression   03/03/2020 Cancer Staging   Staging form: Colon and Rectum, AJCC 8th Edition - Pathologic stage from 03/03/2020: Stage IIIB (pT4a, pN1b, cM0) - Signed by Tracy Merle, Tracy [name] on 01/28/2021 Stage prefix: Initial diagnosis Histologic grading system: 4 grade system Histologic grade (G): G2 Residual tumor (R): R0 - None    03/08/2020 Imaging   CT AP 03/08/20 IMPRESSION: 1. Interval midline laparotomy with distal colon resection and diverting left lower quadrant colostomy. 2. Diffuse small bowel dilatation  with gas fluid levels most consistent with postoperative ileus. 3. Trace ascites within the abdomen and pelvis. No fluid collection or abscess at this time. Surgical drain within the lower pelvis. 4. Indeterminate 1.4 cm subcapsular liver hypodensity. In light of newly diagnosed rectal cancer, metastatic disease cannot be excluded. PET CT may be useful for further evaluation. 5. Interval development of trace bilateral pleural effusions and diffuse body wall edema.   03/12/2020 Surgery   EXPLORATORY LAPAROTOMY WITH BOWEL RESECTION AND COLOSTOMY by Dr Hassell Done 03/12/20  FINAL MICROSCOPIC DIAGNOSIS: 03/12/20  A. COLON, SPLENIC FLEXURE, RESECTION:  - Segment of colon (37 cm) with perforation and associated inflammation  - Multiple mucosal ulcers with necrotizing inflammation  - No evidence of malignancy    03/12/2020 Imaging   CT AP WO contrast 03/12/20  IMPRESSION: 1. Exam is limited by lack of intravenous contrast, motion, and artifact from the patient's arms adjacent to the torso. Since 03/08/2020, there has been development of relatively large pockets of intraperitoneal free air. While intraperitoneal gas would not be unexpected on postoperative day 9, this gas is new since an intervening study of 03/08/2020 and given the relatively large volume certainly raises concern for bowel perforation. No source for the intraperitoneal free air is evident on this study. There is a surgical drain in the pelvis, but there is no free gas around the drain itself to suggest that it represents the source. 2. New circumferential wall thickening in the splenic flexure, descending colon and sigmoid colon leading into the end colostomy. Infection/inflammation would be a consideration. Ischemia cannot be excluded. 3. Relatively small volume intraperitoneal free fluid. High attenuation small fluid collections in the left upper abdomen may reflect hemorrhage, infection or residua from prior perforation. 4.  Interval progression of diffuse body wall edema. 5. Residual contrast material in the renal parenchyma from prior imaging, compatible with renal dysfunction.    03/19/2020 Imaging   CT CAP 03/19/20  IMPRESSION: 1. 5.5 x 3.2 cm fluid collection is noted along the greater curvature of the proximal stomach. 2. Surgical drain is again noted in the pelvis with tip in left lower quadrant. 3. Interval development of crescent-shaped fluid collection measuring 16.4 x 3.3 cm in the epigastric region and left upper quadrant of the abdomen which may extend into the left lower quadrant. Potentially this may represent abscess or developing abscess. Multiple other smaller fluid collections are noted which may represent small abscesses. 4. 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 5. Mild amount of free fluid is noted in the posterior pelvis. 6. Moderate anasarca is noted. Aortic Atherosclerosis (ICD10-I70.0).   04/12/2020 Imaging   CT AP 04/12/20  IMPRESSION: 1. Fluid collections within the LEFT abdomen have significantly decreased compared to previous CT exams, now nearly completely resolved. 2. Percutaneous drainage catheter with tip coiled posterior to the LEFT kidney, stable positioning compared to the previous study. The more anterior catheter has been removed. 3. Small amount of free fluid persists within the abdomen and pelvis.  4. Trace bibasilar pleural effusions. 5. Anasarca. 6. While reviewing today's study, comparing with a chest/abdomen/pelvis CT from earlier same month, there is question of thrombus in the LEFT internal jugular vein. Recommend ultrasound of the LEFT IJ to exclude DVT. This recommendation discussed with patient's hospitalist, Dr. Owens Shark, on 04/12/2020 at 4:20 p.m.   Emphysema (ICD10-J43.9).   04/22/2020 Imaging   CT AP 04/22/20  IMPRESSION: 1. No recurrent intra-abdominal abscess status post interval removal of left retroperitoneal percutaneous drain. Stable  small amount of free pelvic fluid. 2. Enlarging dependent bilateral pleural effusions, now moderate in volume. Associated atelectasis at both lung bases. 3. Progressive anasarca with generalized edema throughout the subcutaneous fat. 4. Stable small subcapsular fluid collection along the anterior aspect of the left hepatic lobe. 5. Aortic Atherosclerosis (ICD10-I70.0).   05/23/2020 Initial Diagnosis   Rectal cancer ([place])   01/14/2021 Imaging   CT CAP IMPRESSION: -5.0 cm soft tissue mass along the posterior aspect of the cecum at the base of the appendix, favored to reflect a peritoneal/serosal implant, corresponding to the patient's known adenocarcinoma. This is new from the prior. -Suspected additional pelvic implants along the uterus and right adnexa, poorly visualized, new/progressive from the prior. -Additional pericapsular lesion along the lateral spleen is mildly progressive, suspicious for additional peritoneal implant. -No evidence of metastatic disease in the chest.   01/27/2021 PET scan   IMPRESSION:  1. Extensive hypermetabolic peritoneal metastasis, as detailed  above.  2. No evidence of hypermetabolic supradiaphragmatic disease.  3. Diffuse low-level thyroid hypermetabolism can be seen in the  setting of thyroiditis. Consider correlation with thyroid function  labs.  4. Aortic atherosclerosis (ICD10-I70.0) and emphysema (ICD10-J43.9).    02/01/2021 -  Chemotherapy    First-line chemo Xeloda 1500 mg twice daily for day 1-14, every 21 days, started on 02/01/2021. Reduced to 1 week on/ 1 week off and added bevacizumab q2weeks from C2 on 02/24/21.  ----Given good tolerance, I changed to CAPOX and bevacizumab q2weeks from C4 on 03/24/21 with Xeloda 1562m in the AM and 10063min the PM 1 week on/1 week off.     05/08/2021 Imaging   PET  IMPRESSION: 1. There is been interval development of several FDG avid pulmonary nodules within both lungs. Additionally, there is new FDG  avid low left paratracheal lymph node. Thoracic metastasis cannot be excluded. 2. Interval improvement in multifocal FDG avid peritoneal metastasis as detailed above.     08/14/2021 Imaging   CT CAP  IMPRESSION: Resolution of previously seen small bilateral pulmonary nodules since previous study.   Stable 8 mm mediastinal lymph node in the AP window.   No evidence of metastatic disease within the abdomen or pelvis.   Nonocclusive pulmonary embolism in bilateral central lower lobe pulmonary arteries, which appears subacute in age.   [place] 01/06/2021 Procedure   (surveillance colonoscopy for h/o rectal cancer) Impression:  - Preparation of the colon was fair, lavage performed with mostly adequate views. - Polypoid lesion obliterating appendiceal orifice. Biopsied to evaluate for malignancy. - The examined portion of the ileum was normal. - Two large polyps in the cecum, not removed today pending path at appendiceal orifice, bowel prep. If removed endoscopically in the future would favor EMR to be done at the hospital. - One 5 mm polyp at the ileocecal valve, removed with a cold snare. Resected and retrieved. - One 3 mm polyp in the transverse colon, removed with a cold snare. Resected and retrieved. - Parastomal hernia  making initial entry to the distal colon challenging. - The examination was otherwise normal.   01/06/2021 Initial Biopsy   Diagnosis 1. Colon, biopsy, appendiceal oraface - ADENOCARCINOMA. 2. Colon, polyp(s), ileocecal valve, transverse, x2 - TUBULAR ADENOMA, NEGATIVE FOR HIGH GRADE DYSPLASIA (X1). - COLONIC MUCOSA WITH UNDERLYING LYMPHOID AGGREGATE, NEGATIVE FOR DYSPLASIA (X1).  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 This is new from the prior. -Suspected additional pelvic implants along the uterus and right adnexa, poorly visualized, new/progressive from the prior. -Additional pericapsular lesion along the lateral spleen is mildly progressive, suspicious for additional peritoneal implant. -No evidence of metastatic disease in the chest.   08/14/2021 Imaging   CT CAP IMPRESSION: Resolution of previously seen small bilateral pulmonary nodules since previous study. Stable 8 mm mediastinal lymph node in the AP window. No evidence of metastatic disease within the abdomen or pelvis. Nonocclusive pulmonary embolism in bilateral central lower lobe pulmonary arteries, which appears subacute in age.  Aortic Atherosclerosis (ICD10-I70.0) and Emphysema (ICD10-J43.9).     08/14/2021 Imaging   CT CAP  IMPRESSION: Resolution of previously seen small bilateral pulmonary nodules since previous study.   Stable 8 mm mediastinal lymph node in the AP window.   No evidence of metastatic disease within the abdomen or pelvis.   Nonocclusive pulmonary embolism in bilateral central lower lobe pulmonary arteries, which appears subacute in age.      INTERVAL HISTORY:  Tracy [name] is here for a follow up of rectal cancer. She was last seen by me on 09/29/21. She presents to the clinic alone. She reports doing well overall. She reports her breathing is stable, aside from her allergies. She denies any side effects from oxali. She notes maybe some minimal tingling in her fingers. She reports she had emergent eye surgery to her left eye due to a tear. She notes she plans to have cataract removal next year.   All other systems were reviewed with the patient and are negative.  MEDICAL HISTORY:  Past Medical History:  Diagnosis Date   Acute deep vein thrombosis (DVT) of brachial vein of right upper extremity (HCC)    Acute on chronic  respiratory failure with hypoxia (HCC)    Asthma    Cataract    CHICKENPOX, HX OF 01/06/2011   Qualifier: Diagnosis of  By: Charlett Blake Tracy [name], Stacey     COPD (chronic obstructive pulmonary disease) ([place]) 2011   FeV1 31% predicted FeV1/FVX 47 %   COVID-19 05/2021   Essential hypertension 05/23/2020   Hemorrhoid    Open right radial fracture 2020   Osteopenia    Perforated sigmoid colon ([place]) 03/03/2020   Pleural effusion    Pneumonia 2021   Postoperative intra-abdominal abscess 2021   Rectal cancer (HCC)    Seasonal allergies    takes Claritin daily prn   Shock circulatory ([place])    Tracheostomy status ([place])    Vitamin D deficiency    takes Vit d every 14 days    SURGICAL HISTORY: Past Surgical History:  Procedure Laterality Date   AUGMENTATION MAMMAPLASTY     saline   EXAMINATION UNDER ANESTHESIA  10/14/2012   Procedure: EXAM UNDER ANESTHESIA;  Surgeon: Gayland Curry, Tracy [name],FACS;  Location: [place];  Service: General;  Laterality: N/A;  rectal exam under anesthesia excisional hemorrhoidectomy hemorrhoidal banding x two   EXAMINATION UNDER ANESTHESIA  02/03/2013   excision hemorrhoidal tissue   FOOT SURGERY     left bunionectomy   HEMORRHOIDECTOMY WITH HEMORRHOID BANDING  10/14/2012   Procedure: HEMORRHOIDECTOMY WITH HEMORRHOID BANDING;  Surgeon: Gayland Curry, Tracy [name],FACS;  Location: [place];  Service: General;  Laterality: N/A;   IR IMAGING GUIDED PORT INSERTION  02/03/2021   IR THORACENTESIS ASP PLEURAL SPACE W/IMG GUIDE  04/23/2020   LAPAROTOMY N/A 03/03/2020   Procedure: low anterior resection end colostomy;  Surgeon: Tracy Ruff, Tracy [name];  Location: [facility];  Service: General;  Laterality: N/A;   LAPAROTOMY N/A 03/12/2020   Procedure: EXPLORATORY LAPAROTOMY WITH BOWEL RESECTION AND COLOSTOMY;  Surgeon: Tracy Hausen, Tracy [name];  Location: [facility];  Service: General;  Laterality: N/A;   OPEN REDUCTION INTERNAL FIXATION (ORIF) DISTAL RADIAL FRACTURE Right 11/01/2018   Procedure: OPEN REDUCTION INTERNAL  FIXATION (ORIF) DISTAL RADIAL FRACTURE;  Surgeon: [name], Tracy [name];  Location: [place];  Service: Orthopedics;  Laterality: Right;   TONSILLECTOMY  1960   recurrent otitis media   US ECHOCARDIOGRAPHY  02/2020    poor windows, normal LV function, severely dilated RV with moderately reduced function, RV volume and pressure overload, mildly dilated RA    I have reviewed the social history and family history with the patient and they are unchanged from previous note.  ALLERGIES:  is allergic to amlodipine and codeine.  MEDICATIONS:  Current Outpatient Medications  Medication Sig Dispense Refill   albuterol (PROVENTIL) (2.5 MG/3ML) 0.083% nebulizer solution Take 3 mLs (2.5 mg total) by nebulization every 4 (four) hours as needed for wheezing or shortness of breath. DX: J44.9 360 mL 5   albuterol (VENTOLIN HFA) 108 (90 Base) MCG/ACT inhaler Inhale 2 puffs into the lungs every 6 (six) hours as needed for wheezing or shortness of breath. 8 g 5   ALPRAZolam (XANAX) 0.25 MG tablet Take 1 tablet (0.25 mg total) by mouth at bedtime as needed for anxiety. 30 tablet 0   capecitabine (XELODA) 500 MG tablet Take 3 tablets (1,500 mg total) by mouth 2 (two) times daily after a meal. Take for 7 days on, 7 days off, repeat every 14 days. 84 tablet 1   diphenhydrAMINE (BENADRYL) 25 mg capsule Take 25 mg by mouth every 6 (six) hours as needed.     docusate sodium (COLACE) 50 MG capsule Take 50 mg by mouth daily as needed for mild constipation.     doxycycline (VIBRA-TABS) 100 MG tablet Take 1 tablet (100 mg total) by mouth 2 (two) times daily. 14 tablet 0   fluticasone (FLONASE) 50 MCG/ACT nasal spray Place 2 sprays into both nostrils daily. 16 mL 5   Fluticasone-Umeclidin-Vilant (TRELEGY ELLIPTA) 100-62.5-25 MCG/INH AEPB Inhale 1 puff into the lungs daily. 60 each 5   HYDROcodone bit-homatropine (HYCODAN) 5-1.5 MG/5ML syrup Take 5 mLs by mouth every 6 (six) hours as needed for cough. 240 mL 0    HYDROcodone bit-homatropine (HYDROMET) 5-1.5 MG/5ML syrup Take 5 mLs by mouth every 6 (six) hours as needed for cough. 120 mL 0   lidocaine-prilocaine (EMLA) cream Apply 1 application topically as needed. 30 g 0   ondansetron (ZOFRAN) 8 MG tablet Take 1 tablet (8 mg total) by mouth every 8 (eight) hours as needed for nausea or vomiting. 20 tablet 2   predniSONE (DELTASONE) 10 MG tablet Take 4 tabs by mouth once daily x4  days, then 3 tabs x4 days, 2 tabs x4 days, 1 tab x4 days and stop. 40 tablet 0   prochlorperazine (COMPAZINE) 10 MG tablet Take 1 tablet (10 mg total) by mouth every 6 (six) hours as needed for nausea or vomiting. 30 tablet 2   rivaroxaban (XARELTO) 20 MG TABS tablet Take 1 tablet (20 mg total) by mouth daily with supper. 30 tablet 0   traMADol (ULTRAM) 50 MG tablet Take 1 tablet (50 mg total) by mouth every 6 (six) hours as needed. 30 tablet 0   No current facility-administered medications for this visit.   Facility-Administered Medications Ordered in Other Visits  Medication Dose Route Frequency Provider Last Rate Last Admin   sodium chloride flush (NS) 0.9 % injection 10 mL  10 mL Intracatheter PRN Tracy Merle, Tracy [name]   10 mL at 10/20/21 1718    PHYSICAL EXAMINATION: ECOG PERFORMANCE STATUS: 2 - Symptomatic, <50% confined to bed  Vitals:   10/20/21 1254  BP: (!) 142/85  Pulse: 88  Resp: 18  Temp: 97.8 F (36.6 C)  SpO2: 100%   Wt Readings from Last 3 Encounters:  10/20/21 119 lb 1.6 oz (54 kg)  09/29/21 118 lb 14.4 oz (53.9 kg)  09/24/21 120 lb 12.8 oz (54.8 kg)     GENERAL:alert, no distress and comfortable SKIN: skin color normal, no rashes or significant lesions EYES: normal, Conjunctiva are pink and non-injected, sclera clear  NEURO: alert & oriented x 3 with fluent speech  LABORATORY DATA:  I have reviewed the data as listed CBC Latest Ref Rng & Units 10/20/2021 09/29/2021 09/15/2021  WBC 4.0 - 10.5 K/uL 5.0 6.7 6.5  Hemoglobin 12.0 - 15.0 g/dL 12.4 12.3 12.5   Hematocrit 36.0 - 46.0 % 37.6 37.5 37.8  Platelets 150 - 400 K/uL 248 233 224     CMP Latest Ref Rng & Units 10/20/2021 09/29/2021 09/15/2021  Glucose 70 - 99 mg/dL 98 91 107(H)  BUN 8 - 23 mg/dL 17 13 16   Creatinine 0.44 - 1.00 mg/dL 0.81 0.81 0.83  Sodium 135 - 145 mmol/L 138 140 140  Potassium 3.5 - 5.1 mmol/L 4.0 4.1 3.2(L)  Chloride 98 - 111 mmol/L 105 105 105  CO2 22 - 32 mmol/L 23 24 23   Calcium 8.9 - 10.3 mg/dL 9.2 9.5 9.7  Total Protein 6.5 - 8.1 g/dL 7.2 6.9 7.0  Total Bilirubin 0.3 - 1.2 mg/dL 0.7 0.7 0.7  Alkaline Phos 38 - 126 U/L 120 132(H) 114  AST 15 - 41 U/L 20 22 22   ALT 0 - 44 U/L 13 11 12       RADIOGRAPHIC STUDIES: I have personally reviewed the radiological images as listed and agreed with the findings in the report. No results found.    No orders of the defined types were placed in this encounter.  All questions were answered. The patient knows to call the clinic with any problems, questions or concerns. No barriers to learning was detected. The total time spent in the appointment was 30 minutes.     Tracy Merle, Tracy [name] 10/20/2021   I, Wilburn Mylar, am acting as scribe for Tracy Merle, Tracy [name].   I have reviewed the above documentation for accuracy and completeness, and I agree with the above.

## 2021-10-21 ENCOUNTER — Telehealth: Payer: Self-pay | Admitting: Hematology

## 2021-10-21 NOTE — Telephone Encounter (Signed)
Scheduled follow-up appointment per 11/7 los. Patient is aware.

## 2021-10-24 ENCOUNTER — Other Ambulatory Visit: Payer: Self-pay

## 2021-10-24 ENCOUNTER — Encounter (INDEPENDENT_AMBULATORY_CARE_PROVIDER_SITE_OTHER): Payer: Medicare HMO | Admitting: Ophthalmology

## 2021-10-24 DIAGNOSIS — H33301 Unspecified retinal break, right eye: Secondary | ICD-10-CM | POA: Diagnosis not present

## 2021-10-31 ENCOUNTER — Encounter (INDEPENDENT_AMBULATORY_CARE_PROVIDER_SITE_OTHER): Payer: Medicare HMO | Admitting: Ophthalmology

## 2021-10-31 ENCOUNTER — Other Ambulatory Visit: Payer: Self-pay

## 2021-10-31 DIAGNOSIS — H33303 Unspecified retinal break, bilateral: Secondary | ICD-10-CM

## 2021-11-03 ENCOUNTER — Other Ambulatory Visit: Payer: Self-pay | Admitting: Hematology

## 2021-11-07 MED FILL — Dexamethasone Sodium Phosphate Inj 100 MG/10ML: INTRAMUSCULAR | Qty: 1 | Status: AC

## 2021-11-10 ENCOUNTER — Other Ambulatory Visit: Payer: Self-pay

## 2021-11-10 ENCOUNTER — Inpatient Hospital Stay: Payer: Medicare HMO

## 2021-11-10 ENCOUNTER — Inpatient Hospital Stay (HOSPITAL_BASED_OUTPATIENT_CLINIC_OR_DEPARTMENT_OTHER): Payer: Medicare HMO | Admitting: Hematology

## 2021-11-10 ENCOUNTER — Encounter: Payer: Self-pay | Admitting: Hematology

## 2021-11-10 VITALS — BP 146/89 | HR 80 | Temp 97.6°F | Resp 19 | Ht 62.0 in | Wt 119.3 lb

## 2021-11-10 DIAGNOSIS — C20 Malignant neoplasm of rectum: Secondary | ICD-10-CM

## 2021-11-10 DIAGNOSIS — Z5111 Encounter for antineoplastic chemotherapy: Secondary | ICD-10-CM | POA: Diagnosis not present

## 2021-11-10 DIAGNOSIS — C181 Malignant neoplasm of appendix: Secondary | ICD-10-CM | POA: Diagnosis not present

## 2021-11-10 DIAGNOSIS — Z79899 Other long term (current) drug therapy: Secondary | ICD-10-CM | POA: Diagnosis not present

## 2021-11-10 DIAGNOSIS — Z7952 Long term (current) use of systemic steroids: Secondary | ICD-10-CM | POA: Diagnosis not present

## 2021-11-10 DIAGNOSIS — Z7901 Long term (current) use of anticoagulants: Secondary | ICD-10-CM | POA: Diagnosis not present

## 2021-11-10 DIAGNOSIS — Z86718 Personal history of other venous thrombosis and embolism: Secondary | ICD-10-CM | POA: Diagnosis not present

## 2021-11-10 DIAGNOSIS — C799 Secondary malignant neoplasm of unspecified site: Secondary | ICD-10-CM

## 2021-11-10 DIAGNOSIS — Z86711 Personal history of pulmonary embolism: Secondary | ICD-10-CM | POA: Diagnosis not present

## 2021-11-10 DIAGNOSIS — M8580 Other specified disorders of bone density and structure, unspecified site: Secondary | ICD-10-CM | POA: Diagnosis not present

## 2021-11-10 LAB — CMP (CANCER CENTER ONLY)
ALT: 13 U/L (ref 0–44)
AST: 26 U/L (ref 15–41)
Albumin: 3.9 g/dL (ref 3.5–5.0)
Alkaline Phosphatase: 127 U/L — ABNORMAL HIGH (ref 38–126)
Anion gap: 11 (ref 5–15)
BUN: 14 mg/dL (ref 8–23)
CO2: 24 mmol/L (ref 22–32)
Calcium: 9.5 mg/dL (ref 8.9–10.3)
Chloride: 104 mmol/L (ref 98–111)
Creatinine: 0.98 mg/dL (ref 0.44–1.00)
GFR, Estimated: >60 mL/min (ref >60)
Glucose, Bld: 83 mg/dL (ref 70–99)
Potassium: 4.1 mmol/L (ref 3.5–5.1)
Sodium: 139 mmol/L (ref 135–145)
Total Bilirubin: 0.6 mg/dL (ref 0.3–1.2)
Total Protein: 7.2 g/dL (ref 6.5–8.1)

## 2021-11-10 LAB — CBC WITH DIFFERENTIAL (CANCER CENTER ONLY)
Abs Immature Granulocytes: 0.02 10*3/uL (ref 0.00–0.07)
Basophils Absolute: 0 10*3/uL (ref 0.0–0.1)
Basophils Relative: 0 %
Eosinophils Absolute: 0.1 10*3/uL (ref 0.0–0.5)
Eosinophils Relative: 1 %
HCT: 36.9 % (ref 36.0–46.0)
Hemoglobin: 12.5 g/dL (ref 12.0–15.0)
Immature Granulocytes: 0 %
Lymphocytes Relative: 19 %
Lymphs Abs: 1.3 10*3/uL (ref 0.7–4.0)
MCH: 35.3 pg — ABNORMAL HIGH (ref 26.0–34.0)
MCHC: 33.9 g/dL (ref 30.0–36.0)
MCV: 104.2 fL — ABNORMAL HIGH (ref 80.0–100.0)
Monocytes Absolute: 0.8 10*3/uL (ref 0.1–1.0)
Monocytes Relative: 11 %
Neutro Abs: 4.8 10*3/uL (ref 1.7–7.7)
Neutrophils Relative %: 69 %
Platelet Count: 265 10*3/uL (ref 150–400)
RBC: 3.54 MIL/uL — ABNORMAL LOW (ref 3.87–5.11)
RDW: 15.5 % (ref 11.5–15.5)
WBC Count: 7.1 10*3/uL (ref 4.0–10.5)
nRBC: 0 % (ref 0.0–0.2)

## 2021-11-10 LAB — CEA (IN HOUSE-CHCC): CEA (CHCC-In House): 4.47 ng/mL (ref 0.00–5.00)

## 2021-11-10 LAB — TOTAL PROTEIN, URINE DIPSTICK: Protein, ur: NEGATIVE mg/dL

## 2021-11-10 MED ORDER — OXALIPLATIN CHEMO INJECTION 100 MG/20ML
50.0000 mg/m2 | Freq: Once | INTRAVENOUS | Status: AC
  Administered 2021-11-10: 13:00:00 80 mg via INTRAVENOUS
  Filled 2021-11-10: qty 16

## 2021-11-10 MED ORDER — HEPARIN SOD (PORK) LOCK FLUSH 100 UNIT/ML IV SOLN
500.0000 [IU] | Freq: Once | INTRAVENOUS | Status: AC | PRN
  Administered 2021-11-10: 15:00:00 500 [IU]

## 2021-11-10 MED ORDER — DEXTROSE 5 % IV SOLN: Freq: Once | INTRAVENOUS | Status: AC

## 2021-11-10 MED ORDER — SODIUM CHLORIDE 0.9 % IV SOLN
10.0000 mg | Freq: Once | INTRAVENOUS | Status: AC
  Administered 2021-11-10: 11:00:00 10 mg via INTRAVENOUS
  Filled 2021-11-10: qty 10

## 2021-11-10 MED ORDER — PALONOSETRON HCL INJECTION 0.25 MG/5ML
0.2500 mg | Freq: Once | INTRAVENOUS | Status: AC
  Administered 2021-11-10: 11:00:00 0.25 mg via INTRAVENOUS
  Filled 2021-11-10: qty 5

## 2021-11-10 MED ORDER — SODIUM CHLORIDE 0.9% FLUSH
10.0000 mL | Freq: Once | INTRAVENOUS | Status: AC
  Administered 2021-11-10: 10:00:00 10 mL

## 2021-11-10 MED ORDER — SODIUM CHLORIDE 0.9% FLUSH
10.0000 mL | INTRAVENOUS | Status: DC | PRN
  Administered 2021-11-10: 15:00:00 10 mL

## 2021-11-10 NOTE — Progress Notes (Signed)
[place]   Telephone:([phone]) [phone] Fax:([phone]) [phone]   Clinic Follow up Note   Patient Care Team: [name], DO as PCP - General (Family Medicine) [name], MD as Consulting Physician (Hematology) Icard, Octavio Graves, DO as Consulting Physician (Pulmonary Disease) [place] [name], MD as Consulting Physician (General Surgery) [name], MD as Consulting Physician (General Surgery) [name], [name], MD as Consulting Physician (Gastroenterology)  Date of Service:  11/10/2021  CHIEF COMPLAINT: f/u of rectal cancer  CURRENT THERAPY:  First-line chemo Xeloda 1500 mg twice daily for day 1-14, every 21 days, started on 02/01/2021. Reduced to 1 week on/ 1 week off and added bevacizumab q2weeks from C2 on 02/24/21. Given good tolerance, changed to CAPOX and bevacizumab q2weeks from C4 on 03/24/21 with Xeloda 1580m in the AM and 10085min the PM 1 week on/1 week off. Changed oxali to q3weeks and hold beva from C10 on 09/29/21  ASSESSMENT & PLAN:  DoASHLYND MICHNAs a [age].o. female with   1. [name], MMR normal, peritoneal metastasis in 01/2021 -diagnosed 02/2020, due to perforation underwent emergent surgery. Path showed invasive adenocarcinoma with perforation, +3/15 LNs, and clear margins. Initial CT scan negative for distant metastasis. -due to bowel perforation and + LNs she has very high risk for recurrence, unfortunately she was not a candidate for adjuvant chemo due to very slow recovery from surgery -Guardant Reveal ctDNA were all negative x3 (06/06/20, 07/12/20, and 09/10/20). -surveillance CT 08/27/20 showed a small density adjacent to the spleen that had decreased, no other evidence of recurrent or metastatic disease -first surveillance colonoscopy by Dr. ArHavery Moros/24/22 showed new adenocarcinoma at appendiceal orifice and other tubular adenomas throughout the colon.  Biopsy confirmed  adenocarcinoma -01/27/21 PET showed tumor in appendiceal orifice is likely peritoneal metastasis growing into the cecum.  Her peritoneal implants are mainly in the pelvis, difficult to biopsy. -She began single agent Xeloda with cycle 1 on 02/01/21.  Due to low performance status with cycle 2 was changed to 1 week on/1 week off, and beva was added. She tolerates this well -PS improved and low dose oxaliplatin was added with C4, now on CAPOX/beva q2 weeks, with Xeloda 1 week on/1 week off. -PET scan 05/08/21 showed good response to peritoneal metastasis, with the development of several FDG avid pulmonary nodules, and hypermetabolic left paratracheal node, the differential includes infectious/inflammatory versus thoracic metastasis.  -restaging CT CAP on 08/14/21 showed resolution of pulmonary nodules, stable mediastinal node, no clear radiographic metastatic disease. -labs reviewed, adequate to proceed with oxaliplatin alone today, continue to hold Beva due to her recent PE. She is now receiving oxali every 3 weeks. -plan to repeat restaging scan prior to visit in January    2. Bilateral PE, found on restaging CT CAP 08/14/21  -no hypoxia, stable respiratory status  -no need for inpatient management, she began xarelto 08/15/21. She continues despite the cost. -hold Beva given recent PE, plan to restart after Christmas   3. COVID-19+ 05/21/21 and respiratory infection -she received CAPOX/beva on 05/19/21, tested positive on 05/21/21 -treated with paxlovid and subsequently doxycycline, augmentin, and prednisone -chest x-ray 06/23/21 showed only emphysema -resolved   4. H/o provoked RUE DVT 04/04/20 s/p 3 months AC (Eliquis)   5. Comorbidities: Asthma, COPD, anxiety -on nebulizer and trelegy, has not needed rescue inhaler recently -f/u with pulmonology and PCP -she has had increased SOB recently. Chest CT 09/15/21 was negative for PE.  She was prescribed doxycycline and prednisone on 09/24/21, breathing better.      PLAN: -proceed with oxaliplatin alone today             -continue holding Beva due to recent PE -continue Xeloda, 1500 mg in AM and 1000 mg in PM, 7 days on/7 days off -lab, flush, f/u, and Oxali in 4 and 7 weeks -restaging CT to be done in 6-7 weeks   No problem-specific Assessment & Plan notes found for this encounter.   SUMMARY OF ONCOLOGIC HISTORY: Oncology History Overview Note  Cancer Staging Rectal cancer [facility]) Staging form: Colon and Rectum, AJCC 8th Edition - Pathologic stage from 03/03/2020: Stage IIIB (pT4a, pN1b, cM0) - Signed by [name], MD on 01/28/2021 Stage prefix: Initial diagnosis Histologic grading system: 4 grade system Histologic grade (G): G2 Residual tumor (R): R0 - None    Rectal cancer ([place])  03/03/2020 - 04/05/2020 Hospital Admission   She was admitted to ED on 03/03/20 for abdominal pain and nausea. She had been having diarrhea for several weeks. During hospital stay she developed acute respiratory failure, AKI, RUE DVT from PICC line. Work up showed bowel perforation, small left liver mass and thickening of jejunum. She underwent emergent surgery on 03/03/20 for resection and colostomy placement. Her path showed invasive cancer, metastatic to 3/15 LNs. She had a NGT in placed but this was removed on POD 3. Post op her stoma became necrotic and septic. She had another bowel surgery on 03/12/20, which was NED with necrotic tissue.    03/03/2020 Imaging   CT AP 03/03/20  IMPRESSION: 1. Free intraperitoneal air consistent perforation. Most likely site of perforation is in the distal splenic flexure/proximal descending colon where there is a collection of air and gas measuring 6 centimeters. No obvious soft tissue mass identified in this region. At this site, there is abrupt transition of dilated, stool-filled colon to completely decompressed proximal descending colon. 2. Thickened, inflamed loops of jejunum are identified within the pelvis and are likely  reactive. 3. Small hiatal hernia. 4. Benign-appearing 1.6 centimeter mass the LEFT hepatic lobe. Recommend comparison with prior studies if available. 5.  Emphysema (ICD10-J43.9). 6.  Aortic Atherosclerosis (ICD10-I70.0). 7. Bilateral renal scarring.   03/03/2020 Surgery   low anterior resection end colostomy by Dr Marcello Moores    03/03/2020 Initial Biopsy   FINAL MICROSCOPIC DIAGNOSIS: 03/03/20 A. RECTOSIGMOID COLON, LOW ANTERIOR RESECTION:  - Invasive colonic adenocarcinoma, 3.5 cm.  - Tumor invades the visceral peritoneum.  - Margins of resection are not involved.  - Metastatic carcinoma in (3) of (15) lymph nodes.  - See oncology table.   B. ADDITIONAL SIGMOID COLON, RESECTION:  - Colonic tissue, negative for carcinoma.  ADDENDUM:  Mismatch Repair Protein (IHC)   SUMMARY INTERPRETATION: NORMAL  There is preserved expression of the major MMR proteins. There is a very  low probability that microsatellite instability (MSI) is present.  However, certain clinically significant MMR protein mutations may result  in preservation of nuclear expression. It is recommended that the  preservation of protein expression be correlated with molecular based  MSI testing.   IHC EXPRESSION RESULTS  TEST           RESULT  MLH1:          Preserved nuclear expression  MSH2:          Preserved nuclear expression  MSH6:          Preserved nuclear expression  PMS2:  Preserved nuclear expression   03/03/2020 Cancer Staging   Staging form: Colon and Rectum, AJCC 8th Edition - Pathologic stage from 03/03/2020: Stage IIIB (pT4a, pN1b, cM0) - Signed by [name], MD on 01/28/2021 Stage prefix: Initial diagnosis Histologic grading system: 4 grade system Histologic grade (G): G2 Residual tumor (R): R0 - None    03/08/2020 Imaging   CT AP 03/08/20 IMPRESSION: 1. Interval midline laparotomy with distal colon resection and diverting left lower quadrant colostomy. 2. Diffuse small bowel dilatation  with gas fluid levels most consistent with postoperative ileus. 3. Trace ascites within the abdomen and pelvis. No fluid collection or abscess at this time. Surgical drain within the lower pelvis. 4. Indeterminate 1.4 cm subcapsular liver hypodensity. In light of newly diagnosed rectal cancer, metastatic disease cannot be excluded. PET CT may be useful for further evaluation. 5. Interval development of trace bilateral pleural effusions and diffuse body wall edema.   03/12/2020 Surgery   EXPLORATORY LAPAROTOMY WITH BOWEL RESECTION AND COLOSTOMY by Dr Hassell Done 03/12/20  FINAL MICROSCOPIC DIAGNOSIS: 03/12/20  A. COLON, SPLENIC FLEXURE, RESECTION:  - Segment of colon (37 cm) with perforation and associated inflammation  - Multiple mucosal ulcers with necrotizing inflammation  - No evidence of malignancy    03/12/2020 Imaging   CT AP WO contrast 03/12/20  IMPRESSION: 1. Exam is limited by lack of intravenous contrast, motion, and artifact from the patient's arms adjacent to the torso. Since 03/08/2020, there has been development of relatively large pockets of intraperitoneal free air. While intraperitoneal gas would not be unexpected on postoperative day 9, this gas is new since an intervening study of 03/08/2020 and given the relatively large volume certainly raises concern for bowel perforation. No source for the intraperitoneal free air is evident on this study. There is a surgical drain in the pelvis, but there is no free gas around the drain itself to suggest that it represents the source. 2. New circumferential wall thickening in the splenic flexure, descending colon and sigmoid colon leading into the end colostomy. Infection/inflammation would be a consideration. Ischemia cannot be excluded. 3. Relatively small volume intraperitoneal free fluid. High attenuation small fluid collections in the left upper abdomen may reflect hemorrhage, infection or residua from prior perforation. 4.  Interval progression of diffuse body wall edema. 5. Residual contrast material in the renal parenchyma from prior imaging, compatible with renal dysfunction.    03/19/2020 Imaging   CT CAP 03/19/20  IMPRESSION: 1. 5.5 x 3.2 cm fluid collection is noted along the greater curvature of the proximal stomach. 2. Surgical drain is again noted in the pelvis with tip in left lower quadrant. 3. Interval development of crescent-shaped fluid collection measuring 16.4 x 3.3 cm in the epigastric region and left upper quadrant of the abdomen which may extend into the left lower quadrant. Potentially this may represent abscess or developing abscess. Multiple other smaller fluid collections are noted which may represent small abscesses. 4. 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 5. Mild amount of free fluid is noted in the posterior pelvis. 6. Moderate anasarca is noted. Aortic Atherosclerosis (ICD10-I70.0).   04/12/2020 Imaging   CT AP 04/12/20  IMPRESSION: 1. Fluid collections within the LEFT abdomen have significantly decreased compared to previous CT exams, now nearly completely resolved. 2. Percutaneous drainage catheter with tip coiled posterior to the LEFT kidney, stable positioning compared to the previous study. The more anterior catheter has been removed. 3. Small amount of free fluid persists within the abdomen and pelvis.  4. Trace bibasilar pleural effusions. 5. Anasarca. 6. While reviewing today's study, comparing with a chest/abdomen/pelvis CT from earlier same month, there is question of thrombus in the LEFT internal jugular vein. Recommend ultrasound of the LEFT IJ to exclude DVT. This recommendation discussed with patient's hospitalist, Dr. Owens Shark, on 04/12/2020 at 4:20 p.m.   Emphysema (ICD10-J43.9).   04/22/2020 Imaging   CT AP 04/22/20  IMPRESSION: 1. No recurrent intra-abdominal abscess status post interval removal of left retroperitoneal percutaneous drain. Stable  small amount of free pelvic fluid. 2. Enlarging dependent bilateral pleural effusions, now moderate in volume. Associated atelectasis at both lung bases. 3. Progressive anasarca with generalized edema throughout the subcutaneous fat. 4. Stable small subcapsular fluid collection along the anterior aspect of the left hepatic lobe. 5. Aortic Atherosclerosis (ICD10-I70.0).   05/23/2020 Initial Diagnosis   Rectal cancer ([place])   01/14/2021 Imaging   CT CAP IMPRESSION: -5.0 cm soft tissue mass along the posterior aspect of the cecum at the base of the appendix, favored to reflect a peritoneal/serosal implant, corresponding to the patient's known adenocarcinoma. This is new from the prior. -Suspected additional pelvic implants along the uterus and right adnexa, poorly visualized, new/progressive from the prior. -Additional pericapsular lesion along the lateral spleen is mildly progressive, suspicious for additional peritoneal implant. -No evidence of metastatic disease in the chest.   01/27/2021 PET scan   IMPRESSION:  1. Extensive hypermetabolic peritoneal metastasis, as detailed  above.  2. No evidence of hypermetabolic supradiaphragmatic disease.  3. Diffuse low-level thyroid hypermetabolism can be seen in the  setting of thyroiditis. Consider correlation with thyroid function  labs.  4. Aortic atherosclerosis (ICD10-I70.0) and emphysema (ICD10-J43.9).    02/01/2021 -  Chemotherapy    First-line chemo Xeloda 1500 mg twice daily for day 1-14, every 21 days, started on 02/01/2021. Reduced to 1 week on/ 1 week off and added bevacizumab q2weeks from C2 on 02/24/21.  ----Given good tolerance, I changed to CAPOX and bevacizumab q2weeks from C4 on 03/24/21 with Xeloda 1534m in the AM and 10027min the PM 1 week on/1 week off.     05/08/2021 Imaging   PET  IMPRESSION: 1. There is been interval development of several FDG avid pulmonary nodules within both lungs. Additionally, there is new FDG  avid low left paratracheal lymph node. Thoracic metastasis cannot be excluded. 2. Interval improvement in multifocal FDG avid peritoneal metastasis as detailed above.     08/14/2021 Imaging   CT CAP  IMPRESSION: Resolution of previously seen small bilateral pulmonary nodules since previous study.   Stable 8 mm mediastinal lymph node in the AP window.   No evidence of metastatic disease within the abdomen or pelvis.   Nonocclusive pulmonary embolism in bilateral central lower lobe pulmonary arteries, which appears subacute in age.   Malignant neoplasm of appendix ([facility] [place] 01/06/2021 Procedure   (surveillance colonoscopy for h/o rectal cancer) Impression:  - Preparation of the colon was fair, lavage performed with mostly adequate views. - Polypoid lesion obliterating appendiceal orifice. Biopsied to evaluate for malignancy. - The examined portion of the ileum was normal. - Two large polyps in the cecum, not removed today pending path at appendiceal orifice, bowel prep. If removed endoscopically in the future would favor EMR to be done at the hospital. - One 5 mm polyp at the ileocecal valve, removed with a cold snare. Resected and retrieved. - One 3 mm polyp in the transverse colon, removed with a cold snare. Resected and retrieved. - Parastomal hernia  making initial entry to the distal colon challenging. - The examination was otherwise normal.   01/06/2021 Initial Biopsy   Diagnosis 1. Colon, biopsy, appendiceal oraface - ADENOCARCINOMA. 2. Colon, polyp(s), ileocecal valve, transverse, x2 - TUBULAR ADENOMA, NEGATIVE FOR HIGH GRADE DYSPLASIA (X1). - COLONIC MUCOSA WITH UNDERLYING LYMPHOID AGGREGATE, NEGATIVE FOR DYSPLASIA (X1).  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 This is new from the prior. -Suspected additional pelvic implants along the uterus and right adnexa, poorly visualized, new/progressive from the prior. -Additional pericapsular lesion along the lateral spleen is mildly progressive, suspicious for additional peritoneal implant. -No evidence of metastatic disease in the chest.   08/14/2021 Imaging   CT CAP IMPRESSION: Resolution of previously seen small bilateral pulmonary nodules since previous study. Stable 8 mm mediastinal lymph node in the AP window. No evidence of metastatic disease within the abdomen or pelvis. Nonocclusive pulmonary embolism in bilateral central lower lobe pulmonary arteries, which appears subacute in age.  Aortic Atherosclerosis (ICD10-I70.0) and Emphysema (ICD10-J43.9).     08/14/2021 Imaging   CT CAP  IMPRESSION: Resolution of previously seen small bilateral pulmonary nodules since previous study.   Stable 8 mm mediastinal lymph node in the AP window.   No evidence of metastatic disease within the abdomen or pelvis.   Nonocclusive pulmonary embolism in bilateral central lower lobe pulmonary arteries, which appears subacute in age.      INTERVAL HISTORY:  Tracy [name] is here for a follow up of rectal cancer. She was last seen by me on 10/20/21. She presents to the clinic alone. She reports doing well overall.  She notes she plans to have cataract surgery in March or April.   All other systems were reviewed with the patient and are negative.  MEDICAL HISTORY:  Past Medical History:  Diagnosis Date   Acute deep vein thrombosis (DVT) of brachial vein of right upper extremity (HCC)    Acute on chronic respiratory failure with hypoxia (HCC)    Asthma    Cataract    CHICKENPOX, HX OF 01/06/2011   Qualifier: Diagnosis of  By: Charlett Blake MD, Stacey     COPD (chronic obstructive pulmonary disease) ([place]) 2011   FeV1 31%  predicted FeV1/FVX 47 %   COVID-19 05/2021   Essential hypertension 05/23/2020   Hemorrhoid    Open right radial fracture 2020   Osteopenia    Perforated sigmoid colon ([place]) 03/03/2020   Pleural effusion    Pneumonia 2021   Postoperative intra-abdominal abscess 2021   Rectal cancer (HCC)    Seasonal allergies    takes Claritin daily prn   Shock circulatory ([place])    Tracheostomy status ([place])    Vitamin D deficiency    takes Vit d every 14 days    SURGICAL HISTORY: Past Surgical History:  Procedure Laterality Date   AUGMENTATION MAMMAPLASTY     saline   EXAMINATION UNDER ANESTHESIA  10/14/2012   Procedure: EXAM UNDER ANESTHESIA;  Surgeon: Gayland Curry, MD,FACS;  Location: [place];  Service: General;  Laterality: N/A;  rectal exam under anesthesia excisional hemorrhoidectomy hemorrhoidal banding x two   EXAMINATION UNDER ANESTHESIA  02/03/2013   excision hemorrhoidal tissue   FOOT SURGERY     left bunionectomy  HEMORRHOIDECTOMY WITH HEMORRHOID BANDING  10/14/2012   Procedure: [id number] WITH HEMORRHOID BANDING;  Surgeon: Gayland Curry, MD,FACS;  Location: [place];  Service: General;  Laterality: N/A;   IR IMAGING GUIDED PORT INSERTION  02/03/2021   IR THORACENTESIS ASP PLEURAL SPACE W/IMG GUIDE  04/23/2020   LAPAROTOMY N/A 03/03/2020   Procedure: low anterior resection end colostomy;  Surgeon: [name], MD;  Location: [facility];  Service: General;  Laterality: N/A;   LAPAROTOMY N/A 03/12/2020   Procedure: EXPLORATORY LAPAROTOMY WITH BOWEL RESECTION AND COLOSTOMY;  Surgeon: [name], MD;  Location: [facility];  Service: General;  Laterality: N/A;   OPEN REDUCTION INTERNAL FIXATION (ORIF) DISTAL RADIAL FRACTURE Right 11/01/2018   Procedure: OPEN REDUCTION INTERNAL FIXATION (ORIF) DISTAL RADIAL FRACTURE;  Surgeon: [name], MD;  Location: [facility];  Service: Orthopedics;  Laterality: Right;   TONSILLECTOMY  1960   recurrent otitis media   US  ECHOCARDIOGRAPHY  02/2020    poor windows, normal LV function, severely dilated RV with moderately reduced function, RV volume and pressure overload, mildly dilated RA    I have reviewed the social history and family history with the patient and they are unchanged from previous note.  ALLERGIES:  is allergic to amlodipine and codeine.  MEDICATIONS:  Current Outpatient Medications  Medication Sig Dispense Refill   albuterol (PROVENTIL) (2.5 MG/3ML) 0.083% nebulizer solution Take 3 mLs (2.5 mg total) by nebulization every 4 (four) hours as needed for wheezing or shortness of breath. DX: J44.9 360 mL 5   albuterol (VENTOLIN HFA) 108 (90 Base) MCG/ACT inhaler Inhale 2 puffs into the lungs every 6 (six) hours as needed for wheezing or shortness of breath. 8 g 5   ALPRAZolam (XANAX) 0.25 MG tablet Take 1 tablet (0.25 mg total) by mouth at bedtime as needed for anxiety. 30 tablet 0   capecitabine (XELODA) 500 MG tablet Take 3 tablets (1,500 mg total) by mouth 2 (two) times daily after a meal. Take for 7 days on, 7 days off, repeat every 14 days. 84 tablet 1   diphenhydrAMINE (BENADRYL) 25 mg capsule Take 25 mg by mouth every 6 (six) hours as needed.     docusate sodium (COLACE) 50 MG capsule Take 50 mg by mouth daily as needed for mild constipation.     fluticasone (FLONASE) 50 MCG/ACT nasal spray Place 2 sprays into both nostrils daily. 16 mL 5   Fluticasone-Umeclidin-Vilant (TRELEGY ELLIPTA) 100-62.5-25 MCG/INH AEPB Inhale 1 puff into the lungs daily. 60 each 5   HYDROcodone bit-homatropine (HYCODAN) 5-1.5 MG/5ML syrup Take 5 mLs by mouth every 6 (six) hours as needed for cough. 240 mL 0   HYDROcodone bit-homatropine (HYDROMET) 5-1.5 MG/5ML syrup Take 5 mLs by mouth every 6 (six) hours as needed for cough. 120 mL 0   lidocaine-prilocaine (EMLA) cream Apply 1 application topically as needed. 30 g 0   ondansetron (ZOFRAN) 8 MG tablet Take 1 tablet (8 mg total) by mouth every 8 (eight) hours as needed  for nausea or vomiting. 20 tablet 2   prochlorperazine (COMPAZINE) 10 MG tablet Take 1 tablet (10 mg total) by mouth every 6 (six) hours as needed for nausea or vomiting. 30 tablet 2   XARELTO 20 MG TABS tablet TAKE 1 TABLET BY MOUTH DAILY WITH SUPPER. 30 tablet 0   No current facility-administered medications for this visit.    PHYSICAL EXAMINATION: ECOG PERFORMANCE STATUS: 2 - Symptomatic, <50% confined to bed  Vitals:   11/10/21 1051  BP: (!) 146/89  Pulse: 80  Resp: 19  Temp: 97.6 F (36.4 C)  SpO2: 94%   Wt Readings from Last 3 Encounters:  11/10/21 119 lb 4.8 oz (54.1 kg)  10/20/21 119 lb 1.6 oz (54 kg)  09/29/21 118 lb 14.4 oz (53.9 kg)     GENERAL:alert, no distress and comfortable SKIN: skin color normal, no rashes or significant lesions EYES: normal, Conjunctiva are pink and non-injected, sclera clear  NEURO: alert & oriented x 3 with fluent speech  LABORATORY DATA:  I have reviewed the data as listed CBC Latest Ref Rng & Units 11/10/2021 10/20/2021 09/29/2021  WBC 4.0 - 10.5 K/uL 7.1 5.0 6.7  Hemoglobin 12.0 - 15.0 g/dL 12.5 12.4 12.3  Hematocrit 36.0 - 46.0 % 36.9 37.6 37.5  Platelets 150 - 400 K/uL 265 248 233     CMP Latest Ref Rng & Units 11/10/2021 10/20/2021 09/29/2021  Glucose 70 - 99 mg/dL 83 98 91  BUN 8 - 23 mg/dL 14 17 13   Creatinine 0.44 - 1.00 mg/dL 0.98 0.81 0.81  Sodium 135 - 145 mmol/L 139 138 140  Potassium 3.5 - 5.1 mmol/L 4.1 4.0 4.1  Chloride 98 - 111 mmol/L 104 105 105  CO2 22 - 32 mmol/L 24 23 24   Calcium 8.9 - 10.3 mg/dL 9.5 9.2 9.5  Total Protein 6.5 - 8.1 g/dL 7.2 7.2 6.9  Total Bilirubin 0.3 - 1.2 mg/dL 0.6 0.7 0.7  Alkaline Phos 38 - 126 U/L 127(H) 120 132(H)  AST 15 - 41 U/L 26 20 22   ALT 0 - 44 U/L 13 13 11       RADIOGRAPHIC STUDIES: I have personally reviewed the radiological images as listed and agreed with the findings in the report. No results found.    Orders Placed This Encounter  Procedures   CT CHEST  ABDOMEN PELVIS W CONTRAST    Standing Status:   Future    Standing Expiration Date:   11/10/2022    Order Specific Question:   Preferred imaging location?    Answer:   [facility]    Order Specific Question:   Release to patient    Answer:   Immediate    Order Specific Question:   Is Oral Contrast requested for this exam?    Answer:   Yes, Per Radiology protocol   All questions were answered. The patient knows to call the clinic with any problems, questions or concerns. No barriers to learning was detected. The total time spent in the appointment was 30 minutes.     [name], MD 11/10/2021   I, Wilburn Mylar, am acting as scribe for [name], MD.   I have reviewed the above documentation for accuracy and completeness, and I agree with the above.

## 2021-11-11 ENCOUNTER — Telehealth: Payer: Self-pay | Admitting: Hematology

## 2021-11-11 NOTE — Telephone Encounter (Signed)
Scheduled follow-up appointment per 11/28 los. Patient is aware.

## 2021-12-04 ENCOUNTER — Other Ambulatory Visit: Payer: Self-pay | Admitting: Hematology

## 2021-12-10 ENCOUNTER — Inpatient Hospital Stay: Payer: Medicare HMO | Attending: Hematology

## 2021-12-10 ENCOUNTER — Other Ambulatory Visit: Payer: Self-pay

## 2021-12-10 ENCOUNTER — Inpatient Hospital Stay: Payer: Medicare HMO

## 2021-12-10 ENCOUNTER — Inpatient Hospital Stay (HOSPITAL_BASED_OUTPATIENT_CLINIC_OR_DEPARTMENT_OTHER): Payer: Medicare HMO | Admitting: Hematology

## 2021-12-10 VITALS — BP 125/75 | HR 78 | Temp 97.7°F | Resp 19 | Wt 117.0 lb

## 2021-12-10 DIAGNOSIS — Z79899 Other long term (current) drug therapy: Secondary | ICD-10-CM | POA: Diagnosis not present

## 2021-12-10 DIAGNOSIS — C181 Malignant neoplasm of appendix: Secondary | ICD-10-CM | POA: Diagnosis not present

## 2021-12-10 DIAGNOSIS — C799 Secondary malignant neoplasm of unspecified site: Secondary | ICD-10-CM

## 2021-12-10 DIAGNOSIS — C20 Malignant neoplasm of rectum: Secondary | ICD-10-CM

## 2021-12-10 DIAGNOSIS — Z7901 Long term (current) use of anticoagulants: Secondary | ICD-10-CM | POA: Insufficient documentation

## 2021-12-10 DIAGNOSIS — R69 Illness, unspecified: Secondary | ICD-10-CM | POA: Diagnosis not present

## 2021-12-10 DIAGNOSIS — Z5111 Encounter for antineoplastic chemotherapy: Secondary | ICD-10-CM | POA: Diagnosis not present

## 2021-12-10 DIAGNOSIS — Z86718 Personal history of other venous thrombosis and embolism: Secondary | ICD-10-CM | POA: Diagnosis not present

## 2021-12-10 DIAGNOSIS — F419 Anxiety disorder, unspecified: Secondary | ICD-10-CM | POA: Insufficient documentation

## 2021-12-10 DIAGNOSIS — C786 Secondary malignant neoplasm of retroperitoneum and peritoneum: Secondary | ICD-10-CM | POA: Diagnosis not present

## 2021-12-10 DIAGNOSIS — Z86711 Personal history of pulmonary embolism: Secondary | ICD-10-CM | POA: Insufficient documentation

## 2021-12-10 DIAGNOSIS — M8580 Other specified disorders of bone density and structure, unspecified site: Secondary | ICD-10-CM | POA: Diagnosis not present

## 2021-12-10 LAB — CMP (CANCER CENTER ONLY)
ALT: 12 U/L (ref 0–44)
AST: 24 U/L (ref 15–41)
Albumin: 4.1 g/dL (ref 3.5–5.0)
Alkaline Phosphatase: 126 U/L (ref 38–126)
Anion gap: 11 (ref 5–15)
BUN: 19 mg/dL (ref 8–23)
CO2: 24 mmol/L (ref 22–32)
Calcium: 9.4 mg/dL (ref 8.9–10.3)
Chloride: 101 mmol/L (ref 98–111)
Creatinine: 1.13 mg/dL — ABNORMAL HIGH (ref 0.44–1.00)
GFR, Estimated: 53 mL/min — ABNORMAL LOW (ref >60)
Glucose, Bld: 121 mg/dL — ABNORMAL HIGH (ref 70–99)
Potassium: 3.8 mmol/L (ref 3.5–5.1)
Sodium: 136 mmol/L (ref 135–145)
Total Bilirubin: 0.7 mg/dL (ref 0.3–1.2)
Total Protein: 7.3 g/dL (ref 6.5–8.1)

## 2021-12-10 LAB — CBC WITH DIFFERENTIAL (CANCER CENTER ONLY)
Abs Immature Granulocytes: 0.02 10*3/uL (ref 0.00–0.07)
Basophils Absolute: 0.1 10*3/uL (ref 0.0–0.1)
Basophils Relative: 1 %
Eosinophils Absolute: 0.1 10*3/uL (ref 0.0–0.5)
Eosinophils Relative: 1 %
HCT: 39.4 % (ref 36.0–46.0)
Hemoglobin: 13.1 g/dL (ref 12.0–15.0)
Immature Granulocytes: 0 %
Lymphocytes Relative: 12 %
Lymphs Abs: 0.8 10*3/uL (ref 0.7–4.0)
MCH: 34.8 pg — ABNORMAL HIGH (ref 26.0–34.0)
MCHC: 33.2 g/dL (ref 30.0–36.0)
MCV: 104.8 fL — ABNORMAL HIGH (ref 80.0–100.0)
Monocytes Absolute: 0.4 10*3/uL (ref 0.1–1.0)
Monocytes Relative: 7 %
Neutro Abs: 5.1 10*3/uL (ref 1.7–7.7)
Neutrophils Relative %: 79 %
Platelet Count: 238 10*3/uL (ref 150–400)
RBC: 3.76 MIL/uL — ABNORMAL LOW (ref 3.87–5.11)
RDW: 15.7 % — ABNORMAL HIGH (ref 11.5–15.5)
WBC Count: 6.4 10*3/uL (ref 4.0–10.5)
nRBC: 0 % (ref 0.0–0.2)

## 2021-12-10 LAB — CEA (IN HOUSE-CHCC): CEA (CHCC-In House): 5.1 ng/mL — ABNORMAL HIGH (ref 0.00–5.00)

## 2021-12-10 LAB — TOTAL PROTEIN, URINE DIPSTICK: Protein, ur: 30 mg/dL — AB

## 2021-12-10 MED ORDER — HEPARIN SOD (PORK) LOCK FLUSH 100 UNIT/ML IV SOLN: 500.0000 [IU] | Freq: Once | INTRAVENOUS | Status: DC | PRN

## 2021-12-10 MED ORDER — SODIUM CHLORIDE 0.9 % IV SOLN: Freq: Once | INTRAVENOUS | Status: AC

## 2021-12-10 MED ORDER — SODIUM CHLORIDE 0.9% FLUSH
10.0000 mL | Freq: Once | INTRAVENOUS | Status: AC
  Administered 2021-12-10: 12:00:00 10 mL

## 2021-12-10 MED ORDER — DEXTROSE 5 % IV SOLN: Freq: Once | INTRAVENOUS | Status: AC

## 2021-12-10 MED ORDER — PALONOSETRON HCL INJECTION 0.25 MG/5ML
0.2500 mg | Freq: Once | INTRAVENOUS | Status: AC
  Administered 2021-12-10: 14:00:00 0.25 mg via INTRAVENOUS
  Filled 2021-12-10: qty 5

## 2021-12-10 MED ORDER — SODIUM CHLORIDE 0.9 % IV SOLN
10.0000 mg | Freq: Once | INTRAVENOUS | Status: AC
  Administered 2021-12-10: 13:00:00 10 mg via INTRAVENOUS
  Filled 2021-12-10: qty 10

## 2021-12-10 MED ORDER — SODIUM CHLORIDE 0.9 % IV SOLN: 5.0000 mg/kg | Freq: Once | INTRAVENOUS | Status: DC

## 2021-12-10 MED ORDER — SODIUM CHLORIDE 0.9% FLUSH: 10.0000 mL | INTRAVENOUS | Status: DC | PRN

## 2021-12-10 MED ORDER — OXALIPLATIN CHEMO INJECTION 100 MG/20ML
50.0000 mg/m2 | Freq: Once | INTRAVENOUS | Status: AC
  Administered 2021-12-10: 15:00:00 80 mg via INTRAVENOUS
  Filled 2021-12-10: qty 16

## 2021-12-10 NOTE — Progress Notes (Signed)
Tracy [name]   [phone] [phone]   Clinic Follow up Note   Patient Care Team: [name], DO as PCP - General (Family Medicine) [name], MD as Consulting Physician (Hematology) Icard, Octavio Graves, DO as Consulting Physician (Pulmonary Disease) [facility] [name], MD as Consulting Physician (General Surgery) [name], MD as Consulting Physician (General Surgery) [name], [name], MD as Consulting Physician (Gastroenterology)  Date of Service:  12/10/2021  CHIEF COMPLAINT: f/u of rectal cancer  CURRENT THERAPY:  First-line chemo Xeloda 1500 mg twice daily for day 1-14, every 21 days, started on 02/01/2021. Reduced to 1 week on/ 1 week off and added bevacizumab q2weeks from C2 on 02/24/21. Given good tolerance, changed to CAPOX and bevacizumab q2weeks from C4 on 03/24/21 with Xeloda 1512m in the AM and 10066min the PM 1 week on/1 week off. Changed oxali to q3weeks and hold beva from C10 on 09/29/21  ASSESSMENT & PLAN:  Tracy LORINOs a [age].o. female with   1. [name], MMR normal, peritoneal metastasis in 01/2021 -diagnosed 02/2020, due to perforation underwent emergent surgery. Path showed invasive adenocarcinoma with perforation, +3/15 LNs, and clear margins. Initial CT scan negative for distant metastasis. -due to bowel perforation and + LNs she has very high risk for recurrence, unfortunately she was not a candidate for adjuvant chemo due to very slow recovery from surgery -Guardant Reveal ctDNA were all negative x3 (06/06/20, 07/12/20, and 09/10/20). -surveillance CT 08/27/20 showed a small density adjacent to the spleen that had decreased, no other evidence of recurrent or metastatic disease -first surveillance colonoscopy by Dr. ArHavery Moros/24/22 showed new adenocarcinoma at appendiceal orifice and other tubular adenomas throughout the colon.  Biopsy confirmed  adenocarcinoma -01/27/21 PET showed tumor in appendiceal orifice is likely peritoneal metastasis growing into the cecum.  Her peritoneal implants are mainly in the pelvis, difficult to biopsy. -She began single agent Xeloda with cycle 1 on 02/01/21.  Due to low performance status with cycle 2 was changed to 1 week on/1 week off, and beva was added. She tolerates this well -PS improved and low dose oxaliplatin was added with C4, now on CAPOX/beva q2 weeks, with Xeloda 1 week on/1 week off. -PET scan 05/08/21 showed good response to peritoneal metastasis, with the development of several FDG avid pulmonary nodules, and hypermetabolic left paratracheal node, the differential includes infectious/inflammatory versus thoracic metastasis.  -restaging CT CAP on 08/14/21 showed resolution of pulmonary nodules, stable mediastinal node, no clear radiographic metastatic disease. -labs reviewed, adequate to proceed with oxaliplatin alone today, continue to hold Beva due to her recent PE. She is now receiving oxali every 3 weeks. She reports today she is taking Xeloda 1500 mg BID. -plan to repeat restaging scan prior to next visit in January  -lab reviewed and discussed with pt, CEA slightly elevated today, will continue monitoring    2. Bilateral PE, found on restaging CT CAP 08/14/21  -no hypoxia, stable respiratory status  -no need for inpatient management, she began xarelto 08/15/21. She continues despite the cost. -hold Beva given recent PE, plan to restart after New Year    3. COVID-19+ 05/21/21 and respiratory infection -she received CAPOX/beva on 05/19/21, tested positive on 05/21/21 -treated with paxlovid and subsequently doxycycline, augmentin, and prednisone -chest x-ray 06/23/21 showed only emphysema -resolved   4. H/o provoked RUE DVT 04/04/20 s/p 3 months AC (Eliquis)   5. Comorbidities: Asthma, COPD, anxiety -  on nebulizer and trelegy, has not needed rescue inhaler recently -f/u with pulmonology and PCP -she  has had increased SOB recently. Chest CT 09/15/21 was negative for PE. She was prescribed doxycycline and prednisone on 09/24/21, breathing better.     PLAN: -proceed with oxaliplatin alone today             -continue holding Beva due to recent PE -continue Xeloda, 1500 mg BID, 7 days on/7 days off -will schedule CT before next visit -lab, flush, f/u, and Oxali and restart Beva on 12/29/21   No problem-specific Assessment & Plan notes found for this encounter.   SUMMARY OF ONCOLOGIC HISTORY: Oncology History Overview Note  Cancer Staging Rectal cancer [facility] Staging form: Colon and Rectum, AJCC 8th Edition - Pathologic stage from 03/03/2020: Stage IIIB (pT4a, pN1b, cM0) - Signed by [name], MD on 01/28/2021 Stage prefix: Initial diagnosis Histologic grading system: 4 grade system Histologic grade (G): G2 Residual tumor (R): R0 - None    Rectal cancer ([place])  03/03/2020 - 04/05/2020 Hospital Admission   She was admitted to ED on 03/03/20 for abdominal pain and nausea. She had been having diarrhea for several weeks. During hospital stay she developed acute respiratory failure, AKI, RUE DVT from PICC line. Work up showed bowel perforation, small left liver mass and thickening of jejunum. She underwent emergent surgery on 03/03/20 for resection and colostomy placement. Her path showed invasive cancer, metastatic to 3/15 LNs. She had a NGT in placed but this was removed on POD 3. Post op her stoma became necrotic and septic. She had another bowel surgery on 03/12/20, which was NED with necrotic tissue.    03/03/2020 Imaging   CT AP 03/03/20  IMPRESSION: 1. Free intraperitoneal air consistent perforation. Most likely site of perforation is in the distal splenic flexure/proximal descending colon where there is a collection of air and gas measuring 6 centimeters. No obvious soft tissue mass identified in this region. At this site, there is abrupt transition of dilated, stool-filled colon to  completely decompressed proximal descending colon. 2. Thickened, inflamed loops of jejunum are identified within the pelvis and are likely reactive. 3. Small hiatal hernia. 4. Benign-appearing 1.6 centimeter mass the LEFT hepatic lobe. Recommend comparison with prior studies if available. 5.  Emphysema (ICD10-J43.9). 6.  Aortic Atherosclerosis (ICD10-I70.0). 7. Bilateral renal scarring.   03/03/2020 Surgery   low anterior resection end colostomy by Dr Marcello Moores    03/03/2020 Initial Biopsy   FINAL MICROSCOPIC DIAGNOSIS: 03/03/20 A. RECTOSIGMOID COLON, LOW ANTERIOR RESECTION:  - Invasive colonic adenocarcinoma, 3.5 cm.  - Tumor invades the visceral peritoneum.  - Margins of resection are not involved.  - Metastatic carcinoma in (3) of (15) lymph nodes.  - See oncology table.   B. ADDITIONAL SIGMOID COLON, RESECTION:  - Colonic tissue, negative for carcinoma.  ADDENDUM:  Mismatch Repair Protein (IHC)   SUMMARY INTERPRETATION: NORMAL  There is preserved expression of the major MMR proteins. There is a very  low probability that microsatellite instability (MSI) is present.  However, certain clinically significant MMR protein mutations may result  in preservation of nuclear expression. It is recommended that the  preservation of protein expression be correlated with molecular based  MSI testing.   IHC EXPRESSION RESULTS  TEST           RESULT  MLH1:          Preserved nuclear expression  MSH2:          Preserved nuclear expression  Preserved nuclear expression  °PMS2:          Preserved nuclear expression °  °03/03/2020 Cancer Staging  ° Staging form: Colon and Rectum, AJCC 8th Edition °- Pathologic stage from 03/03/2020: Stage IIIB (pT4a, pN1b, cM0) - Signed by , , MD on 01/28/2021 °Stage prefix: Initial diagnosis °Histologic grading system: 4 grade system °Histologic grade (G): G2 °Residual tumor (R): R0 - None ° °  °03/08/2020 Imaging  ° CT AP  03/08/20 °IMPRESSION: °1. Interval midline laparotomy with distal colon resection and °diverting left lower quadrant colostomy. °2. Diffuse small bowel dilatation with gas fluid levels most °consistent with postoperative ileus. °3. Trace ascites within the abdomen and pelvis. No fluid collection °or abscess at this time. Surgical drain within the lower pelvis. °4. Indeterminate 1.4 cm subcapsular liver hypodensity. In light of °newly diagnosed rectal cancer, metastatic disease cannot be °excluded. PET CT may be useful for further evaluation. °5. Interval development of trace bilateral pleural effusions and °diffuse body wall edema. °  °03/12/2020 Surgery  ° EXPLORATORY LAPAROTOMY WITH BOWEL RESECTION AND COLOSTOMY by Dr Martin 03/12/20 ° °FINAL MICROSCOPIC DIAGNOSIS: 03/12/20 ° °A. COLON, SPLENIC FLEXURE, RESECTION:  °- Segment of colon (37 cm) with perforation and associated inflammation  °- Multiple mucosal ulcers with necrotizing inflammation  °- No evidence of malignancy  °  °03/12/2020 Imaging  ° CT AP WO contrast 03/12/20  °IMPRESSION: °1. Exam is limited by lack of intravenous contrast, motion, and °artifact from the patient's arms adjacent to the torso. Since °03/08/2020, there has been development of relatively large pockets °of intraperitoneal free air. While intraperitoneal gas would not be °unexpected on postoperative day 9, this gas is new since an °intervening study of 03/08/2020 and given the relatively large °volume certainly raises concern for bowel perforation. No source for °the intraperitoneal free air is evident on this study. There is a °surgical drain in the pelvis, but there is no free gas around the °drain itself to suggest that it represents the source. °2. New circumferential wall thickening in the splenic flexure, °descending colon and sigmoid colon leading into the end colostomy. °Infection/inflammation would be a consideration. Ischemia cannot be °excluded. °3. Relatively small volume  intraperitoneal free fluid. High °attenuation small fluid collections in the left upper abdomen may °reflect hemorrhage, infection or residua from prior perforation. °4. Interval progression of diffuse body wall edema. °5. Residual contrast material in the renal parenchyma from prior °imaging, compatible with renal dysfunction. ° °  °03/19/2020 Imaging  ° CT CAP 03/19/20  °IMPRESSION: °1. 5.5 x 3.2 cm fluid collection is noted along the greater °curvature of the proximal stomach. °2. Surgical drain is again noted in the pelvis with tip in left °lower quadrant. °3. Interval development of crescent-shaped fluid collection °measuring 16.4 x 3.3 cm in the epigastric region and left upper °quadrant of the abdomen which may extend into the left lower °quadrant. Potentially this may represent abscess or developing °abscess. Multiple other smaller fluid collections are noted which °may represent small abscesses. °4. 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 °5. Mild amount of free fluid is noted in the posterior pelvis. °6. Moderate anasarca is noted. °Aortic Atherosclerosis (ICD10-I70.0). °  °04/12/2020 Imaging  ° CT AP 04/12/20  °IMPRESSION: °1. Fluid collections within the LEFT abdomen have significantly °decreased compared to previous CT exams, now nearly completely °resolved. °2. Percutaneous drainage catheter with tip coiled posterior to the °LEFT kidney, stable positioning compared to the previous study. The °more anterior catheter has   been removed. °3. Small amount of free fluid persists within the abdomen and °pelvis. °4. Trace bibasilar pleural effusions. °5. Anasarca. °6. While reviewing today's study, comparing with a °chest/abdomen/pelvis CT from earlier same month, there is question °of thrombus in the LEFT internal jugular vein. Recommend ultrasound °of the LEFT IJ to exclude DVT. This recommendation discussed with °patient's hospitalist, Dr. Brown, on 04/12/2020 at 4:20 p.m. °  °Emphysema (ICD10-J43.9). °   °04/22/2020 Imaging  ° CT AP 04/22/20  °IMPRESSION: °1. No recurrent intra-abdominal abscess status post interval removal °of left retroperitoneal percutaneous drain. Stable small amount of °free pelvic fluid. °2. Enlarging dependent bilateral pleural effusions, now moderate in °volume. Associated atelectasis at both lung bases. °3. Progressive anasarca with generalized edema throughout the °subcutaneous fat. °4. Stable small subcapsular fluid collection along the anterior °aspect of the left hepatic lobe. °5. Aortic Atherosclerosis (ICD10-I70.0). °  °05/23/2020 Initial Diagnosis  ° Rectal cancer (HCC) °  °01/14/2021 Imaging  ° CT CAP IMPRESSION: °-5.0 cm soft tissue mass along the posterior aspect of the cecum at °the base of the appendix, favored to reflect a peritoneal/serosal °implant, corresponding to the patient's known adenocarcinoma. This °is new from the prior. °-Suspected additional pelvic implants along the uterus and right °adnexa, poorly visualized, new/progressive from the prior. °-Additional pericapsular lesion along the lateral spleen is mildly °progressive, suspicious for additional peritoneal implant. °-No evidence of metastatic disease in the chest. °  °01/27/2021 PET scan  ° IMPRESSION:  °1. Extensive hypermetabolic peritoneal metastasis, as detailed  °above.  °2. No evidence of hypermetabolic supradiaphragmatic disease.  °3. Diffuse low-level thyroid hypermetabolism can be seen in the  °setting of thyroiditis. Consider correlation with thyroid function  °labs.  °4. Aortic atherosclerosis (ICD10-I70.0) and emphysema (ICD10-J43.9).  °  °02/01/2021 -  Chemotherapy  °  First-line chemo Xeloda 1500 mg twice daily for day 1-14, every 21 days, started on 02/01/2021. Reduced to 1 week on/ 1 week off and added bevacizumab q2weeks from C2 on 02/24/21.  °----Given good tolerance, I changed to CAPOX and bevacizumab q2weeks from C4 on 03/24/21 with Xeloda 1500mg in the AM and 1000mg in the PM 1 week on/1 week off.   ° °  °05/08/2021 Imaging  ° PET  °IMPRESSION: °1. There is been interval development of several FDG avid pulmonary °nodules within both lungs. Additionally, there is new FDG avid low °left paratracheal lymph node. Thoracic metastasis cannot be °excluded. °2. Interval improvement in multifocal FDG avid peritoneal metastasis °as detailed above. °  °  °08/14/2021 Imaging  ° CT CAP ° °IMPRESSION: °Resolution of previously seen small bilateral pulmonary nodules °since previous study. °  °Stable 8 mm mediastinal lymph node in the AP window. °  °No evidence of metastatic disease within the abdomen or pelvis. °  °Nonocclusive pulmonary embolism in bilateral central lower lobe °pulmonary arteries, which appears subacute in age. °  °Malignant neoplasm of appendix (HCC)  °01/06/2021 Procedure  ° (surveillance colonoscopy for h/o rectal cancer) Impression:  °- Preparation of the colon was fair, lavage performed with mostly adequate views. °- Polypoid lesion obliterating appendiceal orifice. Biopsied to evaluate for malignancy. °- The examined portion of the ileum was normal. °- Two large polyps in the cecum, not removed today pending path at appendiceal orifice, bowel prep. If removed endoscopically in the future would favor EMR to be done at the hospital. °- One 5 mm polyp at the ileocecal valve, removed with a cold snare. Resected and retrieved. °- One 3 mm polyp in   snare. Resected and retrieved. - One 3 mm polyp in the transverse colon, removed with a cold snare. Resected and retrieved. - Parastomal hernia making initial entry to the distal colon challenging. - The examination was otherwise normal.   01/06/2021 Initial Biopsy   Diagnosis 1. Colon, biopsy, appendiceal oraface - ADENOCARCINOMA. 2. Colon, polyp(s), ileocecal valve, transverse, x2 - TUBULAR ADENOMA, NEGATIVE FOR HIGH GRADE DYSPLASIA (X1). - COLONIC MUCOSA WITH UNDERLYING LYMPHOID AGGREGATE, NEGATIVE FOR DYSPLASIA (X1).  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 This is new from the prior. -Suspected additional pelvic implants along the uterus and right adnexa, poorly visualized, new/progressive from the prior. -Additional pericapsular lesion along the lateral spleen is mildly progressive, suspicious for additional peritoneal implant. -No evidence of metastatic disease in the chest.   08/14/2021 Imaging   CT CAP IMPRESSION: Resolution of previously seen small bilateral pulmonary nodules since previous study. Stable 8 mm mediastinal lymph node in the AP window. No evidence of metastatic disease within the abdomen or pelvis. Nonocclusive pulmonary embolism in bilateral central lower lobe pulmonary arteries, which appears subacute in age.  Aortic Atherosclerosis (ICD10-I70.0) and Emphysema (ICD10-J43.9).     08/14/2021 Imaging   CT CAP  IMPRESSION: Resolution of previously seen small bilateral pulmonary nodules since previous study.   Stable 8 mm mediastinal lymph node in the AP window.   No evidence of metastatic disease within the abdomen or pelvis.   Nonocclusive pulmonary embolism in bilateral central lower lobe pulmonary arteries, which appears subacute in age.      INTERVAL HISTORY:  Tracy [name] is here for a follow up of rectal cancer. She was last seen by me on 11/10/21. She was seen in the infusion area. She reports itching to the skin on the back of her neck and her stomach. She denies rash.   All other systems were reviewed with the patient and are negative.  MEDICAL HISTORY:  Past Medical History:  Diagnosis Date   Acute deep vein thrombosis (DVT) of brachial vein of right upper extremity (HCC)    Acute on chronic respiratory failure with hypoxia (HCC)    Asthma    Cataract     CHICKENPOX, HX OF 01/06/2011   Qualifier: Diagnosis of  By: Charlett Blake MD, Stacey     COPD (chronic obstructive pulmonary disease) ([place]) 2011   FeV1 31% predicted FeV1/FVX 47 %   COVID-19 05/2021   Essential hypertension 05/23/2020   Hemorrhoid    Open right radial fracture 2020   Osteopenia    Perforated sigmoid colon ([place]) 03/03/2020   Pleural effusion    Pneumonia 2021   Postoperative intra-abdominal abscess 2021   Rectal cancer (HCC)    Seasonal allergies    takes Claritin daily prn   Shock circulatory ([place])    Tracheostomy status ([place])    Vitamin D deficiency    takes Vit d every 14 days    SURGICAL HISTORY: Past Surgical History:  Procedure Laterality Date   AUGMENTATION MAMMAPLASTY     saline   EXAMINATION UNDER ANESTHESIA  10/14/2012   Procedure: EXAM UNDER ANESTHESIA;  Surgeon: Gayland Curry, MD,FACS;  Location: [facility];  Service: General;  Laterality: N/A;  rectal exam under anesthesia excisional hemorrhoidectomy hemorrhoidal  excision hemorrhoidal tissue  ° FOOT SURGERY    ° left bunionectomy  ° HEMORRHOIDECTOMY WITH HEMORRHOID BANDING  10/14/2012  ° Procedure: HEMORRHOIDECTOMY WITH HEMORRHOID BANDING;  Surgeon: Eric M Wilson, MD,FACS;  Location: [facility];  Service: General;  Laterality: N/A;  ° IR IMAGING GUIDED PORT INSERTION  02/03/2021  ° IR THORACENTESIS ASP PLEURAL SPACE W/IMG GUIDE  04/23/2020  ° LAPAROTOMY N/A 03/03/2020  ° Procedure: low anterior resection end colostomy;  Surgeon: Thomas, Alicia, MD;  Location: [facility];  Service: General;  Laterality: N/A;  ° LAPAROTOMY N/A 03/12/2020  ° Procedure: EXPLORATORY LAPAROTOMY WITH BOWEL RESECTION AND COLOSTOMY;  Surgeon: Martin, Matthew, MD;  Location: [facility];  Service: General;  Laterality: N/A;  ° OPEN REDUCTION INTERNAL FIXATION (ORIF) DISTAL RADIAL FRACTURE Right 11/01/2018  ° Procedure: OPEN REDUCTION INTERNAL FIXATION (ORIF) DISTAL RADIAL FRACTURE;  Surgeon: Kuzma, Kevin, MD;   Location: [facility];  Service: Orthopedics;  Laterality: Right;  ° TONSILLECTOMY  1960  ° recurrent otitis media  ° US ECHOCARDIOGRAPHY  02/2020  °  poor windows, normal LV function, severely dilated RV with moderately reduced function, RV volume and pressure overload, mildly dilated RA  ° ° °I have reviewed the social history and family history with the patient and they are unchanged from previous note. ° °ALLERGIES:  is allergic to amlodipine and codeine. ° °MEDICATIONS:  °Current Outpatient Medications  °Medication Sig Dispense Refill  ° albuterol (PROVENTIL) (2.5 MG/3ML) 0.083% nebulizer solution Take 3 mLs (2.5 mg total) by nebulization every 4 (four) hours as needed for wheezing or shortness of breath. DX: J44.9 360 mL 5  ° albuterol (VENTOLIN HFA) 108 (90 Base) MCG/ACT inhaler Inhale 2 puffs into the lungs every 6 (six) hours as needed for wheezing or shortness of breath. 8 g 5  ° ALPRAZolam (XANAX) 0.25 MG tablet Take 1 tablet (0.25 mg total) by mouth at bedtime as needed for anxiety. 30 tablet 0  ° capecitabine (XELODA) 500 MG tablet Take 3 tablets (1,500 mg total) by mouth 2 (two) times daily after a meal. Take for 7 days on, 7 days off, repeat every 14 days. 84 tablet 1  ° diphenhydrAMINE (BENADRYL) 25 mg capsule Take 25 mg by mouth every 6 (six) hours as needed.    ° docusate sodium (COLACE) 50 MG capsule Take 50 mg by mouth daily as needed for mild constipation.    ° fluticasone (FLONASE) 50 MCG/ACT nasal spray Place 2 sprays into both nostrils daily. 16 mL 5  ° Fluticasone-Umeclidin-Vilant (TRELEGY ELLIPTA) 100-62.5-25 MCG/INH AEPB Inhale 1 puff into the lungs daily. 60 each 5  ° HYDROcodone bit-homatropine (HYCODAN) 5-1.5 MG/5ML syrup Take 5 mLs by mouth every 6 (six) hours as needed for cough. 240 mL 0  ° HYDROcodone bit-homatropine (HYDROMET) 5-1.5 MG/5ML syrup Take 5 mLs by mouth every 6 (six) hours as needed for cough. 120 mL 0  ° lidocaine-prilocaine (EMLA) cream Apply 1  application topically as needed. 30 g 0  ° ondansetron (ZOFRAN) 8 MG tablet Take 1 tablet (8 mg total) by mouth every 8 (eight) hours as needed for nausea or vomiting. 20 tablet 2  ° prochlorperazine (COMPAZINE) 10 MG tablet Take 1 tablet (10 mg total) by mouth every 6 (six) hours as needed for nausea or vomiting. 30 tablet 2  ° XARELTO 20 MG TABS tablet TAKE 1 TABLET BY MOUTH DAILY WITH SUPPER 30 tablet 0  ° °No current facility-administered medications for this visit.  ° ° °PHYSICAL EXAMINATION: °ECOG PERFORMANCE   STATUS: 2 - Symptomatic, <50% confined to bed ° °There were no vitals filed for this visit. °Wt Readings from Last 3 Encounters:  °12/10/21 117 lb (53.1 kg)  °11/10/21 119 lb 4.8 oz (54.1 kg)  °10/20/21 119 lb 1.6 oz (54 kg)  °  ° °GENERAL:alert, no distress and comfortable °SKIN: skin color normal, no rashes or significant lesions °EYES: normal, Conjunctiva are pink and non-injected, sclera clear  °NEURO: alert & oriented x 3 with fluent speech ° °LABORATORY DATA:  °I have reviewed the data as listed °CBC Latest Ref Rng & Units 12/10/2021 11/10/2021 10/20/2021  °WBC 4.0 - 10.5 K/uL 6.4 7.1 5.0  °Hemoglobin 12.0 - 15.0 g/dL 13.1 12.5 12.4  °Hematocrit 36.0 - 46.0 % 39.4 36.9 37.6  °Platelets 150 - 400 K/uL 238 265 248  ° ° ° °CMP Latest Ref Rng & Units 12/10/2021 11/10/2021 10/20/2021  °Glucose 70 - 99 mg/dL 121(H) 83 98  °BUN 8 - 23 mg/dL 19 14 17  °Creatinine 0.44 - 1.00 mg/dL 1.13(H) 0.98 0.81  °Sodium 135 - 145 mmol/L 136 139 138  °Potassium 3.5 - 5.1 mmol/L 3.8 4.1 4.0  °Chloride 98 - 111 mmol/L 101 104 105  °CO2 22 - 32 mmol/L 24 24 23  °Calcium 8.9 - 10.3 mg/dL 9.4 9.5 9.2  °Total Protein 6.5 - 8.1 g/dL 7.3 7.2 7.2  °Total Bilirubin 0.3 - 1.2 mg/dL 0.7 0.6 0.7  °Alkaline Phos 38 - 126 U/L 126 127(H) 120  °AST 15 - 41 U/L 24 26 20  °ALT 0 - 44 U/L 12 13 13  ° ° ° ° °RADIOGRAPHIC STUDIES: °I have personally reviewed the radiological images as listed and agreed with the findings in the report. °No  results found.  ° ° °No orders of the defined types were placed in this encounter. ° °All questions were answered. The patient knows to call the clinic with any problems, questions or concerns. No barriers to learning was detected. °The total time spent in the appointment was 30 minutes. ° °  °  , MD °12/10/2021  ° °I, Katie Daubenspeck, am acting as scribe for  , MD.  ° °I have reviewed the above documentation for accuracy and completeness, and I agree with the above. °  ° °  °

## 2021-12-10 NOTE — Patient Instructions (Signed)
[facility]   Discharge Instructions: Thank you for choosing [facility] to provide your oncology and hematology care.   If you have a lab appointment with the [facility], please go directly to the [facility] and check in at the registration area.   Wear comfortable clothing and clothing appropriate for easy access to any Portacath or PICC line.   We strive to give you quality time with your provider. You may need to reschedule your appointment if you arrive late (15 or more minutes).  Arriving late affects you and other patients whose appointments are after yours.  Also, if you miss three or more appointments without notifying the office, you may be dismissed from the clinic at the providers discretion.      For prescription refill requests, have your pharmacy contact our office and allow 72 hours for refills to be completed.    Today you received the following chemotherapy and/or immunotherapy agents: oxaliplatin and bevacizumab-bvvr      To help prevent nausea and vomiting after your treatment, we encourage you to take your nausea medication as directed.  BELOW ARE SYMPTOMS THAT SHOULD BE REPORTED IMMEDIATELY: *FEVER GREATER THAN 100.4 F (38 C) OR HIGHER *CHILLS OR SWEATING *NAUSEA AND VOMITING THAT IS NOT CONTROLLED WITH YOUR NAUSEA MEDICATION *UNUSUAL SHORTNESS OF BREATH *UNUSUAL BRUISING OR BLEEDING *URINARY PROBLEMS (pain or burning when urinating, or frequent urination) *BOWEL PROBLEMS (unusual diarrhea, constipation, pain near the anus) TENDERNESS IN MOUTH AND THROAT WITH OR WITHOUT PRESENCE OF ULCERS (sore throat, sores in mouth, or a toothache) UNUSUAL RASH, SWELLING OR PAIN  UNUSUAL VAGINAL DISCHARGE OR ITCHING   Items with * indicate a potential emergency and should be followed up as soon as possible or go to the Emergency Department if any problems should occur.  Please show the CHEMOTHERAPY ALERT CARD or IMMUNOTHERAPY  ALERT CARD at check-in to the Emergency Department and triage nurse.  Should you have questions after your visit or need to cancel or reschedule your appointment, please contact [facility]  Dept: [phone]  and follow the prompts.  Office hours are 8:00 a.m. to 4:30 p.m. Monday - Friday. Please note that voicemails left after 4:00 p.m. may not be returned until the following business day.  We are closed weekends and major holidays. You have access to a nurse at all times for urgent questions. Please call the main number to the clinic Dept: [phone] and follow the prompts.   For any non-urgent questions, you may also contact your provider using MyChart. We now offer e-Visits for anyone 41 and older to request care online for non-urgent symptoms. For details visit [email or web address].   Also download the MyChart app! Go to the app store, search "MyChart", open the app, select [facility], and log in with your MyChart username and password.  Due to Covid, a mask is required upon entering the hospital/clinic. If you do not have a mask, one will be given to you upon arrival. For doctor visits, patients may have 1 support person aged 12 or older with them. For treatment visits, patients cannot have anyone with them due to current Covid guidelines and our immunocompromised population.

## 2021-12-10 NOTE — Progress Notes (Signed)
Per Dr. Burr Medico, will hold Bevacizumab until 12/21/21 (next cycle of Oxaliplatin).  Kennith Center, Pharm.D., CPP 12/10/2021@1 :33 PM

## 2021-12-11 ENCOUNTER — Encounter: Payer: Self-pay | Admitting: Hematology

## 2021-12-18 DIAGNOSIS — Z933 Colostomy status: Secondary | ICD-10-CM | POA: Diagnosis not present

## 2021-12-19 ENCOUNTER — Encounter: Payer: Self-pay | Admitting: Gastroenterology

## 2021-12-19 DIAGNOSIS — Z933 Colostomy status: Secondary | ICD-10-CM | POA: Diagnosis not present

## 2021-12-25 ENCOUNTER — Ambulatory Visit (HOSPITAL_COMMUNITY)
Admission: RE | Admit: 2021-12-25 | Discharge: 2021-12-25 | Disposition: A | Discharge status: Home / Self Care | Payer: Medicare HMO | Source: Ambulatory Visit | Attending: Hematology | Admitting: Hematology

## 2021-12-25 ENCOUNTER — Other Ambulatory Visit: Payer: Self-pay

## 2021-12-25 DIAGNOSIS — C19 Malignant neoplasm of rectosigmoid junction: Secondary | ICD-10-CM | POA: Diagnosis not present

## 2021-12-25 DIAGNOSIS — J984 Other disorders of lung: Secondary | ICD-10-CM | POA: Diagnosis not present

## 2021-12-25 DIAGNOSIS — I700 Atherosclerosis of aorta: Secondary | ICD-10-CM | POA: Diagnosis not present

## 2021-12-25 DIAGNOSIS — C20 Malignant neoplasm of rectum: Secondary | ICD-10-CM | POA: Insufficient documentation

## 2021-12-25 DIAGNOSIS — C189 Malignant neoplasm of colon, unspecified: Secondary | ICD-10-CM | POA: Diagnosis not present

## 2021-12-25 DIAGNOSIS — K439 Ventral hernia without obstruction or gangrene: Secondary | ICD-10-CM | POA: Diagnosis not present

## 2021-12-25 DIAGNOSIS — D7389 Other diseases of spleen: Secondary | ICD-10-CM | POA: Diagnosis not present

## 2021-12-25 MED ORDER — IOHEXOL 9 MG/ML PO SOLN
500.0000 mL | ORAL | Status: AC
  Administered 2021-12-25 (×2): 500 mL via ORAL

## 2021-12-25 MED ORDER — IOHEXOL 300 MG/ML  SOLN
100.0000 mL | Freq: Once | INTRAMUSCULAR | Status: AC | PRN
  Administered 2021-12-25: 100 mL via INTRAVENOUS

## 2021-12-29 ENCOUNTER — Inpatient Hospital Stay: Payer: Medicare HMO

## 2021-12-29 ENCOUNTER — Inpatient Hospital Stay: Payer: Medicare HMO | Admitting: Hematology

## 2021-12-29 ENCOUNTER — Encounter: Payer: Self-pay | Admitting: Hematology

## 2021-12-29 ENCOUNTER — Other Ambulatory Visit: Payer: Self-pay

## 2021-12-29 ENCOUNTER — Inpatient Hospital Stay: Payer: Medicare HMO | Attending: Hematology

## 2021-12-29 VITALS — Temp 97.7°F | Resp 20

## 2021-12-29 VITALS — BP 157/85 | HR 80 | Resp 21 | Ht 62.0 in | Wt 120.3 lb

## 2021-12-29 DIAGNOSIS — R69 Illness, unspecified: Secondary | ICD-10-CM | POA: Diagnosis not present

## 2021-12-29 DIAGNOSIS — Z5111 Encounter for antineoplastic chemotherapy: Secondary | ICD-10-CM | POA: Diagnosis present

## 2021-12-29 DIAGNOSIS — C181 Malignant neoplasm of appendix: Secondary | ICD-10-CM | POA: Insufficient documentation

## 2021-12-29 DIAGNOSIS — C786 Secondary malignant neoplasm of retroperitoneum and peritoneum: Secondary | ICD-10-CM | POA: Insufficient documentation

## 2021-12-29 DIAGNOSIS — Z85048 Personal history of other malignant neoplasm of rectum, rectosigmoid junction, and anus: Secondary | ICD-10-CM | POA: Insufficient documentation

## 2021-12-29 DIAGNOSIS — Z86718 Personal history of other venous thrombosis and embolism: Secondary | ICD-10-CM | POA: Diagnosis not present

## 2021-12-29 DIAGNOSIS — Z79899 Other long term (current) drug therapy: Secondary | ICD-10-CM | POA: Diagnosis not present

## 2021-12-29 DIAGNOSIS — F419 Anxiety disorder, unspecified: Secondary | ICD-10-CM | POA: Insufficient documentation

## 2021-12-29 DIAGNOSIS — Z86711 Personal history of pulmonary embolism: Secondary | ICD-10-CM | POA: Insufficient documentation

## 2021-12-29 DIAGNOSIS — Z5112 Encounter for antineoplastic immunotherapy: Secondary | ICD-10-CM | POA: Diagnosis not present

## 2021-12-29 DIAGNOSIS — Z933 Colostomy status: Secondary | ICD-10-CM | POA: Diagnosis not present

## 2021-12-29 DIAGNOSIS — C799 Secondary malignant neoplasm of unspecified site: Secondary | ICD-10-CM

## 2021-12-29 DIAGNOSIS — C20 Malignant neoplasm of rectum: Secondary | ICD-10-CM | POA: Diagnosis not present

## 2021-12-29 DIAGNOSIS — Z7901 Long term (current) use of anticoagulants: Secondary | ICD-10-CM | POA: Diagnosis not present

## 2021-12-29 DIAGNOSIS — J439 Emphysema, unspecified: Secondary | ICD-10-CM | POA: Insufficient documentation

## 2021-12-29 LAB — CBC WITH DIFFERENTIAL (CANCER CENTER ONLY)
Abs Immature Granulocytes: 0.02 10*3/uL (ref 0.00–0.07)
Basophils Absolute: 0 10*3/uL (ref 0.0–0.1)
Basophils Relative: 1 %
Eosinophils Absolute: 0.1 10*3/uL (ref 0.0–0.5)
Eosinophils Relative: 2 %
HCT: 37.1 % (ref 36.0–46.0)
Hemoglobin: 12.6 g/dL (ref 12.0–15.0)
Immature Granulocytes: 0 %
Lymphocytes Relative: 20 %
Lymphs Abs: 1.1 10*3/uL (ref 0.7–4.0)
MCH: 35.6 pg — ABNORMAL HIGH (ref 26.0–34.0)
MCHC: 34 g/dL (ref 30.0–36.0)
MCV: 104.8 fL — ABNORMAL HIGH (ref 80.0–100.0)
Monocytes Absolute: 0.7 10*3/uL (ref 0.1–1.0)
Monocytes Relative: 13 %
Neutro Abs: 3.4 10*3/uL (ref 1.7–7.7)
Neutrophils Relative %: 64 %
Platelet Count: 160 10*3/uL (ref 150–400)
RBC: 3.54 MIL/uL — ABNORMAL LOW (ref 3.87–5.11)
RDW: 15.3 % (ref 11.5–15.5)
WBC Count: 5.3 10*3/uL (ref 4.0–10.5)
nRBC: 0 % (ref 0.0–0.2)

## 2021-12-29 LAB — CMP (CANCER CENTER ONLY)
ALT: 13 U/L (ref 0–44)
AST: 22 U/L (ref 15–41)
Albumin: 3.8 g/dL (ref 3.5–5.0)
Alkaline Phosphatase: 113 U/L (ref 38–126)
Anion gap: 8 (ref 5–15)
BUN: 20 mg/dL (ref 8–23)
CO2: 24 mmol/L (ref 22–32)
Calcium: 8.6 mg/dL — ABNORMAL LOW (ref 8.9–10.3)
Chloride: 101 mmol/L (ref 98–111)
Creatinine: 0.97 mg/dL (ref 0.44–1.00)
GFR, Estimated: >60 mL/min (ref >60)
Glucose, Bld: 96 mg/dL (ref 70–99)
Potassium: 3.6 mmol/L (ref 3.5–5.1)
Sodium: 133 mmol/L — ABNORMAL LOW (ref 135–145)
Total Bilirubin: 1.1 mg/dL (ref 0.3–1.2)
Total Protein: 6.6 g/dL (ref 6.5–8.1)

## 2021-12-29 LAB — TOTAL PROTEIN, URINE DIPSTICK: Protein, ur: <30 mg/dL — AB

## 2021-12-29 LAB — CEA (IN HOUSE-CHCC): CEA (CHCC-In House): 9.15 ng/mL — ABNORMAL HIGH (ref 0.00–5.00)

## 2021-12-29 MED ORDER — CAPECITABINE 500 MG PO TABS: 1000.0000 mg/m2 | ORAL_TABLET | Freq: Two times a day (BID) | ORAL | 1 refills | Status: DC

## 2021-12-29 MED ORDER — DEXTROSE 5 % IV SOLN: Freq: Once | INTRAVENOUS | Status: AC

## 2021-12-29 MED ORDER — SODIUM CHLORIDE 0.9 % IV SOLN: Freq: Once | INTRAVENOUS | Status: AC

## 2021-12-29 MED ORDER — SODIUM CHLORIDE 0.9% FLUSH
10.0000 mL | Freq: Once | INTRAVENOUS | Status: AC
  Administered 2021-12-29: 10 mL

## 2021-12-29 MED ORDER — OXALIPLATIN CHEMO INJECTION 100 MG/20ML
50.0000 mg/m2 | Freq: Once | INTRAVENOUS | Status: AC
  Administered 2021-12-29: 80 mg via INTRAVENOUS
  Filled 2021-12-29: qty 16

## 2021-12-29 MED ORDER — SODIUM CHLORIDE 0.9 % IV SOLN: 7.5000 mg/kg | Freq: Once | INTRAVENOUS | Status: DC

## 2021-12-29 MED ORDER — SODIUM CHLORIDE 0.9 % IV SOLN
7.5000 mg/kg | Freq: Once | INTRAVENOUS | Status: AC
  Administered 2021-12-29: 400 mg via INTRAVENOUS
  Filled 2021-12-29: qty 16

## 2021-12-29 MED ORDER — PALONOSETRON HCL INJECTION 0.25 MG/5ML
0.2500 mg | Freq: Once | INTRAVENOUS | Status: AC
  Administered 2021-12-29: 0.25 mg via INTRAVENOUS
  Filled 2021-12-29: qty 5

## 2021-12-29 MED ORDER — SODIUM CHLORIDE 0.9 % IV SOLN
10.0000 mg | Freq: Once | INTRAVENOUS | Status: AC
  Administered 2021-12-29: 10 mg via INTRAVENOUS
  Filled 2021-12-29: qty 10

## 2021-12-29 MED ORDER — SODIUM CHLORIDE 0.9% FLUSH
10.0000 mL | INTRAVENOUS | Status: DC | PRN
  Administered 2021-12-29: 10 mL

## 2021-12-29 MED ORDER — HEPARIN SOD (PORK) LOCK FLUSH 100 UNIT/ML IV SOLN
500.0000 [IU] | Freq: Once | INTRAVENOUS | Status: AC | PRN
  Administered 2021-12-29: 500 [IU]

## 2021-12-29 NOTE — Patient Instructions (Signed)
[facility]   Discharge Instructions: Thank you for choosing [facility] to provide your oncology and hematology care.   If you have a lab appointment with the [facility], please go directly to the [facility] and check in at the registration area.   Wear comfortable clothing and clothing appropriate for easy access to any Portacath or PICC line.   We strive to give you quality time with your provider. You may need to reschedule your appointment if you arrive late (15 or more minutes).  Arriving late affects you and other patients whose appointments are after yours.  Also, if you miss three or more appointments without notifying the office, you may be dismissed from the clinic at the providers discretion.      For prescription refill requests, have your pharmacy contact our office and allow 72 hours for refills to be completed.    Today you received the following chemotherapy and/or immunotherapy agents: oxaliplatin and bevacizumab-bvvr      To help prevent nausea and vomiting after your treatment, we encourage you to take your nausea medication as directed.  BELOW ARE SYMPTOMS THAT SHOULD BE REPORTED IMMEDIATELY: *FEVER GREATER THAN 100.4 F (38 C) OR HIGHER *CHILLS OR SWEATING *NAUSEA AND VOMITING THAT IS NOT CONTROLLED WITH YOUR NAUSEA MEDICATION *UNUSUAL SHORTNESS OF BREATH *UNUSUAL BRUISING OR BLEEDING *URINARY PROBLEMS (pain or burning when urinating, or frequent urination) *BOWEL PROBLEMS (unusual diarrhea, constipation, pain near the anus) TENDERNESS IN MOUTH AND THROAT WITH OR WITHOUT PRESENCE OF ULCERS (sore throat, sores in mouth, or a toothache) UNUSUAL RASH, SWELLING OR PAIN  UNUSUAL VAGINAL DISCHARGE OR ITCHING   Items with * indicate a potential emergency and should be followed up as soon as possible or go to the Emergency Department if any problems should occur.  Please show the CHEMOTHERAPY ALERT CARD or IMMUNOTHERAPY  ALERT CARD at check-in to the Emergency Department and triage nurse.  Should you have questions after your visit or need to cancel or reschedule your appointment, please contact [facility]  Dept: [phone]  and follow the prompts.  Office hours are 8:00 a.m. to 4:30 p.m. Monday - Friday. Please note that voicemails left after 4:00 p.m. may not be returned until the following business day.  We are closed weekends and major holidays. You have access to a nurse at all times for urgent questions. Please call the main number to the clinic Dept: [phone] and follow the prompts.   For any non-urgent questions, you may also contact your provider using MyChart. We now offer e-Visits for anyone 69 and older to request care online for non-urgent symptoms. For details visit [email or web address].   Also download the MyChart app! Go to the app store, search "MyChart", open the app, select [facility], and log in with your MyChart username and password.  Due to Covid, a mask is required upon entering the hospital/clinic. If you do not have a mask, one will be given to you upon arrival. For doctor visits, patients may have 1 support person aged 14 or older with them. For treatment visits, patients cannot have anyone with them due to current Covid guidelines and our immunocompromised population.

## 2021-12-29 NOTE — Progress Notes (Signed)
[place]   [phone] [phone]   Clinic Follow up Note   Patient Care Team: [name], DO as PCP - General (Family Medicine) [name], MD as Consulting Physician (Hematology) Icard, Octavio Graves, DO as Consulting Physician (Pulmonary Disease) Brecon, P.A. [name], MD as Consulting Physician (General Surgery) [name], MD as Consulting Physician (General Surgery) [name], [name], MD as Consulting Physician (Gastroenterology)  Date of Service:  12/29/2021  CHIEF COMPLAINT: f/u of rectal cancer  CURRENT THERAPY:  CAPOX -Xeloda started 02/01/21, currently 1500 mg BID 7 days on/7 days off -Bevacizumab q2weeks added with C2 02/24/21  -Beva held 09/02/21-12/28/21 d/t PE -Oxaliplatin added with C4 03/24/21, currently q3weeks  ASSESSMENT & PLAN:  Tracy [name] is a 71 y.o. female with   1. [name], MMR normal, peritoneal metastasis in 01/2021 -diagnosed 02/2020, due to perforation underwent emergent surgery. Path showed invasive adenocarcinoma with perforation, +3/15 LNs, and clear margins. Initial CT scan negative for distant metastasis. -due to bowel perforation and + LNs she has very high risk for recurrence, unfortunately she was not a candidate for adjuvant chemo due to very slow recovery from surgery -Guardant Reveal ctDNA were all negative x3 (06/06/20, 07/12/20, and 09/10/20). -surveillance CT 08/27/20 showed a small density adjacent to the spleen that had decreased, no other evidence of recurrent or metastatic disease -first surveillance colonoscopy by Dr. Havery Moros 01/06/21 showed new adenocarcinoma at appendiceal orifice and other tubular adenomas throughout the colon.  Biopsy confirmed adenocarcinoma -01/27/21 PET showed tumor in appendiceal orifice is likely peritoneal metastasis growing into the cecum.  Her peritoneal implants are mainly in the pelvis, difficult to  biopsy. -She began single agent Xeloda with cycle 1 on 02/01/21.  Due to low performance status with cycle 2 was changed to 1 week on/1 week off, and beva was added. She tolerates this well -PS improved and low dose oxaliplatin was added with C4, now on CAPOX/beva q2 weeks, with Xeloda 1 week on/1 week off. -CT CAP 12/25/21 showed stabe cecal mass but otherwise NED. I reviewed the results with her today. -labs reviewed, adequate to proceed with treatment today, will restart Beva. She is now receiving oxali every 3 weeks and taking Xeloda 1500 mg BID.   2. Bilateral PE, found on restaging CT CAP 08/14/21  -no hypoxia, stable respiratory status  -no need for inpatient management, she began xarelto 08/15/21. She continues despite the cost.   3. COVID-19+ 05/21/21 and respiratory infection -she received CAPOX/beva on 05/19/21, tested positive on 05/21/21 -treated with paxlovid and subsequently doxycycline, augmentin, and prednisone   4. H/o provoked RUE DVT 04/04/20 s/p 3 months AC (Eliquis)   5. Comorbidities: Asthma, COPD, anxiety -on nebulizer and trelegy, has not needed rescue inhaler recently -f/u with pulmonology and PCP -she has had increased SOB recently. Chest CT 09/15/21 was negative for PE. She was prescribed doxycycline and prednisone on 09/24/21, breathing better.     PLAN: -proceed with oxaliplatin and restart beva today -continue Xeloda, 1500 mg BID, 7 days on/7 days off -lab, flush, f/u, and Oxali/Beva in 3 and 6 weeks   No problem-specific Assessment & Plan notes found for this encounter.   SUMMARY OF ONCOLOGIC HISTORY: Oncology History Overview Note  Cancer Staging Rectal cancer [facility]) Staging form: Colon and Rectum, AJCC 8th Edition - Pathologic stage from 03/03/2020: Stage IIIB (pT4a, pN1b, cM0) - Signed by [name], MD on 01/28/2021 Stage prefix: Initial diagnosis Histologic  grading system: 4 grade system Histologic grade (G): G2 Residual tumor (R): R0 - None    Rectal  cancer ([place])  03/03/2020 - 04/05/2020 Hospital Admission   She was admitted to ED on 03/03/20 for abdominal pain and nausea. She had been having diarrhea for several weeks. During hospital stay she developed acute respiratory failure, AKI, RUE DVT from PICC line. Work up showed bowel perforation, small left liver mass and thickening of jejunum. She underwent emergent surgery on 03/03/20 for resection and colostomy placement. Her path showed invasive cancer, metastatic to 3/15 LNs. She had a NGT in placed but this was removed on POD 3. Post op her stoma became necrotic and septic. She had another bowel surgery on 03/12/20, which was NED with necrotic tissue.    03/03/2020 Imaging   CT AP 03/03/20  IMPRESSION: 1. Free intraperitoneal air consistent perforation. Most likely site of perforation is in the distal splenic flexure/proximal descending colon where there is a collection of air and gas measuring 6 centimeters. No obvious soft tissue mass identified in this region. At this site, there is abrupt transition of dilated, stool-filled colon to completely decompressed proximal descending colon. 2. Thickened, inflamed loops of jejunum are identified within the pelvis and are likely reactive. 3. Small hiatal hernia. 4. Benign-appearing 1.6 centimeter mass the LEFT hepatic lobe. Recommend comparison with prior studies if available. 5.  Emphysema (ICD10-J43.9). 6.  Aortic Atherosclerosis (ICD10-I70.0). 7. Bilateral renal scarring.   03/03/2020 Surgery   low anterior resection end colostomy by Dr Marcello Moores    03/03/2020 Initial Biopsy   FINAL MICROSCOPIC DIAGNOSIS: 03/03/20 A. RECTOSIGMOID COLON, LOW ANTERIOR RESECTION:  - Invasive colonic adenocarcinoma, 3.5 cm.  - Tumor invades the visceral peritoneum.  - Margins of resection are not involved.  - Metastatic carcinoma in (3) of (15) lymph nodes.  - See oncology table.   B. ADDITIONAL SIGMOID COLON, RESECTION:  - Colonic tissue, negative for  carcinoma.  ADDENDUM:  Mismatch Repair Protein (IHC)   SUMMARY INTERPRETATION: NORMAL  There is preserved expression of the major MMR proteins. There is a very  low probability that microsatellite instability (MSI) is present.  However, certain clinically significant MMR protein mutations may result  in preservation of nuclear expression. It is recommended that the  preservation of protein expression be correlated with molecular based  MSI testing.   IHC EXPRESSION RESULTS  TEST           RESULT  MLH1:          Preserved nuclear expression  MSH2:          Preserved nuclear expression  MSH6:          Preserved nuclear expression  PMS2:          Preserved nuclear expression   03/03/2020 Cancer Staging   Staging form: Colon and Rectum, AJCC 8th Edition - Pathologic stage from 03/03/2020: Stage IIIB (pT4a, pN1b, cM0) - Signed by [name], MD on 01/28/2021 Stage prefix: Initial diagnosis Histologic grading system: 4 grade system Histologic grade (G): G2 Residual tumor (R): R0 - None    03/08/2020 Imaging   CT AP 03/08/20 IMPRESSION: 1. Interval midline laparotomy with distal colon resection and diverting left lower quadrant colostomy. 2. Diffuse small bowel dilatation with gas fluid levels most consistent with postoperative ileus. 3. Trace ascites within the abdomen and pelvis. No fluid collection or abscess at this time. Surgical drain within the lower pelvis. 4. Indeterminate 1.4 cm subcapsular liver hypodensity. In light of newly  diagnosed rectal cancer, metastatic disease cannot be excluded. PET CT may be useful for further evaluation. 5. Interval development of trace bilateral pleural effusions and diffuse body wall edema.   03/12/2020 Surgery   EXPLORATORY LAPAROTOMY WITH BOWEL RESECTION AND COLOSTOMY by Dr Hassell Done 03/12/20  FINAL MICROSCOPIC DIAGNOSIS: 03/12/20  A. COLON, SPLENIC FLEXURE, RESECTION:  - Segment of colon (37 cm) with perforation and associated inflammation   - Multiple mucosal ulcers with necrotizing inflammation  - No evidence of malignancy    03/12/2020 Imaging   CT AP WO contrast 03/12/20  IMPRESSION: 1. Exam is limited by lack of intravenous contrast, motion, and artifact from the patient's arms adjacent to the torso. Since 03/08/2020, there has been development of relatively large pockets of intraperitoneal free air. While intraperitoneal gas would not be unexpected on postoperative day 9, this gas is new since an intervening study of 03/08/2020 and given the relatively large volume certainly raises concern for bowel perforation. No source for the intraperitoneal free air is evident on this study. There is a surgical drain in the pelvis, but there is no free gas around the drain itself to suggest that it represents the source. 2. New circumferential wall thickening in the splenic flexure, descending colon and sigmoid colon leading into the end colostomy. Infection/inflammation would be a consideration. Ischemia cannot be excluded. 3. Relatively small volume intraperitoneal free fluid. High attenuation small fluid collections in the left upper abdomen may reflect hemorrhage, infection or residua from prior perforation. 4. Interval progression of diffuse body wall edema. 5. Residual contrast material in the renal parenchyma from prior imaging, compatible with renal dysfunction.    03/19/2020 Imaging   CT CAP 03/19/20  IMPRESSION: 1. 5.5 x 3.2 cm fluid collection is noted along the greater curvature of the proximal stomach. 2. Surgical drain is again noted in the pelvis with tip in left lower quadrant. 3. Interval development of crescent-shaped fluid collection measuring 16.4 x 3.3 cm in the epigastric region and left upper quadrant of the abdomen which may extend into the left lower quadrant. Potentially this may represent abscess or developing abscess. Multiple other smaller fluid collections are noted which may represent small  abscesses. 4. 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 5. Mild amount of free fluid is noted in the posterior pelvis. 6. Moderate anasarca is noted. Aortic Atherosclerosis (ICD10-I70.0).   04/12/2020 Imaging   CT AP 04/12/20  IMPRESSION: 1. Fluid collections within the LEFT abdomen have significantly decreased compared to previous CT exams, now nearly completely resolved. 2. Percutaneous drainage catheter with tip coiled posterior to the LEFT kidney, stable positioning compared to the previous study. The more anterior catheter has been removed. 3. Small amount of free fluid persists within the abdomen and pelvis. 4. Trace bibasilar pleural effusions. 5. Anasarca. 6. While reviewing today's study, comparing with a chest/abdomen/pelvis CT from earlier same month, there is question of thrombus in the LEFT internal jugular vein. Recommend ultrasound of the LEFT IJ to exclude DVT. This recommendation discussed with patient's hospitalist, Dr. Owens Shark, on 04/12/2020 at 4:20 p.m.   Emphysema (ICD10-J43.9).   04/22/2020 Imaging   CT AP 04/22/20  IMPRESSION: 1. No recurrent intra-abdominal abscess status post interval removal of left retroperitoneal percutaneous drain. Stable small amount of free pelvic fluid. 2. Enlarging dependent bilateral pleural effusions, now moderate in volume. Associated atelectasis at both lung bases. 3. Progressive anasarca with generalized edema throughout the subcutaneous fat. 4. Stable small subcapsular fluid collection along the anterior aspect of the  left hepatic lobe. 5. Aortic Atherosclerosis (ICD10-I70.0).   05/23/2020 Initial Diagnosis   Rectal cancer ([place])   01/14/2021 Imaging   CT CAP IMPRESSION: -5.0 cm soft tissue mass along the posterior aspect of the cecum at the base of the appendix, favored to reflect a peritoneal/serosal implant, corresponding to the patient's known adenocarcinoma. This is new from the prior. -Suspected additional  pelvic implants along the uterus and right adnexa, poorly visualized, new/progressive from the prior. -Additional pericapsular lesion along the lateral spleen is mildly progressive, suspicious for additional peritoneal implant. -No evidence of metastatic disease in the chest.   01/27/2021 PET scan   IMPRESSION:  1. Extensive hypermetabolic peritoneal metastasis, as detailed  above.  2. No evidence of hypermetabolic supradiaphragmatic disease.  3. Diffuse low-level thyroid hypermetabolism can be seen in the  setting of thyroiditis. Consider correlation with thyroid function  labs.  4. Aortic atherosclerosis (ICD10-I70.0) and emphysema (ICD10-J43.9).    02/01/2021 -  Chemotherapy    First-line chemo Xeloda 1500 mg twice daily for day 1-14, every 21 days, started on 02/01/2021. Reduced to 1 week on/ 1 week off and added bevacizumab q2weeks from C2 on 02/24/21.  ----Given good tolerance, I changed to CAPOX and bevacizumab q2weeks from C4 on 03/24/21 with Xeloda 1539m in the AM and 10029min the PM 1 week on/1 week off.     05/08/2021 Imaging   PET  IMPRESSION: 1. There is been interval development of several FDG avid pulmonary nodules within both lungs. Additionally, there is new FDG avid low left paratracheal lymph node. Thoracic metastasis cannot be excluded. 2. Interval improvement in multifocal FDG avid peritoneal metastasis as detailed above.     08/14/2021 Imaging   CT CAP  IMPRESSION: Resolution of previously seen small bilateral pulmonary nodules since previous study.   Stable 8 mm mediastinal lymph node in the AP window.   No evidence of metastatic disease within the abdomen or pelvis.   Nonocclusive pulmonary embolism in bilateral central lower lobe pulmonary arteries, which appears subacute in age.   12/25/2021 Imaging   EXAM: CT CHEST, ABDOMEN, AND PELVIS WITH CONTRAST  IMPRESSION: 1. Minimal, bandlike residua of previously seen FDG avid pulmonary nodules,  consistent with treated metastases. No new pulmonary nodules. 2. Unchanged post treatment appearance of the cecal base, with soft tissue thickening about the cecal base and adjacent pelvis, previously FDG avid and in keeping with known appendiceal orifice malignancy. 3. Unchanged central fluid attenuation lesions about the spleen, left iliopsoas, and left pelvic sidewall, previously FDG avid, consistent with treated peritoneal implants. 4. No evidence of new metastatic disease in the chest, abdomen, or pelvis 5. Status post rectosigmoid colon resection with left lower quadrant end colostomy.   Aortic Atherosclerosis (ICD10-I70.0).   [place] 01/06/2021 Procedure   (surveillance colonoscopy for h/o rectal cancer) Impression:  - Preparation of the colon was fair, lavage performed with mostly adequate views. - Polypoid lesion obliterating appendiceal orifice. Biopsied to evaluate for malignancy. - The examined portion of the ileum was normal. - Two large polyps in the cecum, not removed today pending path at appendiceal orifice, bowel prep. If removed endoscopically in the future would favor EMR to be done at the hospital. - One 5 mm polyp at the ileocecal valve, removed with a cold snare. Resected and retrieved. - One 3 mm polyp in the transverse colon, removed with a cold snare. Resected and retrieved. - Parastomal hernia making initial entry to the distal colon challenging. -  The examination was otherwise normal.   01/06/2021 Initial Biopsy   Diagnosis 1. Colon, biopsy, appendiceal oraface - ADENOCARCINOMA. 2. Colon, polyp(s), ileocecal valve, transverse, x2 - TUBULAR ADENOMA, NEGATIVE FOR HIGH GRADE DYSPLASIA (X1). - COLONIC MUCOSA WITH UNDERLYING LYMPHOID AGGREGATE, NEGATIVE FOR DYSPLASIA (X1).  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 This is new from the prior. -Suspected additional pelvic implants along the uterus and right adnexa, poorly visualized, new/progressive from the prior. -Additional pericapsular lesion along the lateral spleen is mildly progressive, suspicious for additional peritoneal implant. -No evidence of metastatic disease in the chest.   08/14/2021 Imaging   CT CAP IMPRESSION: Resolution of previously seen small bilateral pulmonary nodules since previous study. Stable 8 mm mediastinal lymph node in the AP window. No evidence of metastatic disease within the abdomen or pelvis. Nonocclusive pulmonary embolism in bilateral central lower lobe pulmonary arteries, which appears subacute in age.  Aortic Atherosclerosis (ICD10-I70.0) and Emphysema (ICD10-J43.9).     08/14/2021 Imaging   CT CAP  IMPRESSION: Resolution of previously seen small bilateral pulmonary nodules since previous study.   Stable 8 mm mediastinal lymph node in the AP window.   No evidence of metastatic disease within the abdomen or pelvis.   Nonocclusive pulmonary embolism in bilateral central lower lobe pulmonary arteries, which appears subacute in age.   12/25/2021 Imaging   EXAM: CT CHEST, ABDOMEN, AND PELVIS WITH CONTRAST  IMPRESSION: 1. Minimal, bandlike residua of previously seen FDG avid pulmonary nodules, consistent with treated metastases. No new pulmonary nodules. 2. Unchanged post treatment appearance of the cecal base, with soft tissue thickening about the cecal base and adjacent pelvis, previously FDG avid and in keeping with known appendiceal orifice malignancy. 3.  Unchanged central fluid attenuation lesions about the spleen, left iliopsoas, and left pelvic sidewall, previously FDG avid, consistent with treated peritoneal implants. 4. No evidence of new metastatic disease in the chest, abdomen, or pelvis 5. Status post rectosigmoid colon resection with left lower quadrant end colostomy.   Aortic Atherosclerosis (ICD10-I70.0).      INTERVAL HISTORY:  Tracy [name] is here for a follow up of rectal cancer. She was last seen by me on 12/10/21. She presents to the clinic alone.   All other systems were reviewed with the patient and are negative.  MEDICAL HISTORY:  Past Medical History:  Diagnosis Date   Acute deep vein thrombosis (DVT) of brachial vein of right upper extremity (HCC)    Acute on chronic respiratory failure with hypoxia (HCC)    Asthma    Cataract    CHICKENPOX, HX OF 01/06/2011   Qualifier: Diagnosis of  By: Charlett Blake MD, [place]     Colon cancer [facility])    COPD (chronic obstructive pulmonary disease) ([place]) 2011   FeV1 31% predicted FeV1/FVX 47 %   COVID-19 05/2021   Essential hypertension 05/23/2020   Hemorrhoid    Open right radial fracture 2020   Osteopenia    Perforated sigmoid colon ([place]) 03/03/2020   Pleural effusion    Pneumonia 2021  Postoperative intra-abdominal abscess 2021   Rectal cancer (HCC)    Seasonal allergies    takes Claritin daily prn   Shock circulatory ([place])    Tracheostomy status ([place])    Vitamin D deficiency    takes Vit d every 14 days    SURGICAL HISTORY: Past Surgical History:  Procedure Laterality Date   AUGMENTATION MAMMAPLASTY     saline   EXAMINATION UNDER ANESTHESIA  10/14/2012   Procedure: EXAM UNDER ANESTHESIA;  Surgeon: Gayland Curry, MD,FACS;  Location:  [place];  Service: General;  Laterality: N/A;  rectal exam under anesthesia excisional hemorrhoidectomy hemorrhoidal banding x two   EXAMINATION UNDER ANESTHESIA  02/03/2013   excision hemorrhoidal tissue   FOOT SURGERY     left  bunionectomy   HEMORRHOIDECTOMY WITH HEMORRHOID BANDING  10/14/2012   Procedure: HEMORRHOIDECTOMY WITH HEMORRHOID BANDING;  Surgeon: Gayland Curry, MD,FACS;  Location: [place];  Service: General;  Laterality: N/A;   IR IMAGING GUIDED PORT INSERTION  02/03/2021   IR THORACENTESIS ASP PLEURAL SPACE W/IMG GUIDE  04/23/2020   LAPAROTOMY N/A 03/03/2020   Procedure: low anterior resection end colostomy;  Surgeon: [name], MD;  Location: [facility];  Service: General;  Laterality: N/A;   LAPAROTOMY N/A 03/12/2020   Procedure: EXPLORATORY LAPAROTOMY WITH BOWEL RESECTION AND COLOSTOMY;  Surgeon: [name], MD;  Location: [facility];  Service: General;  Laterality: N/A;   OPEN REDUCTION INTERNAL FIXATION (ORIF) DISTAL RADIAL FRACTURE Right 11/01/2018   Procedure: OPEN REDUCTION INTERNAL FIXATION (ORIF) DISTAL RADIAL FRACTURE;  Surgeon: [name], MD;  Location: [place] [place];  Service: Orthopedics;  Laterality: Right;   TONSILLECTOMY  1960   recurrent otitis media   US ECHOCARDIOGRAPHY  02/2020    poor windows, normal LV function, severely dilated RV with moderately reduced function, RV volume and pressure overload, mildly dilated RA    I have reviewed the social history and family history with the patient and they are unchanged from previous note.  ALLERGIES:  is allergic to amlodipine and codeine.  MEDICATIONS:  Current Outpatient Medications  Medication Sig Dispense Refill   albuterol (PROVENTIL) (2.5 MG/3ML) 0.083% nebulizer solution Take 3 mLs (2.5 mg total) by nebulization every 4 (four) hours as needed for wheezing or shortness of breath. DX: J44.9 360 mL 5   albuterol (VENTOLIN HFA) 108 (90 Base) MCG/ACT inhaler Inhale 2 puffs into the lungs every 6 (six) hours as needed for wheezing or shortness of breath. 8 g 5   ALPRAZolam (XANAX) 0.25 MG tablet Take 1 tablet (0.25 mg total) by mouth at bedtime as needed for anxiety. 30 tablet 0   capecitabine (XELODA) 500 MG tablet Take 3  tablets (1,500 mg total) by mouth 2 (two) times daily after a meal. Take for 7 days on, 7 days off, repeat every 14 days. 84 tablet 1   diphenhydrAMINE (BENADRYL) 25 mg capsule Take 25 mg by mouth every 6 (six) hours as needed.     docusate sodium (COLACE) 50 MG capsule Take 50 mg by mouth daily as needed for mild constipation.     fluticasone (FLONASE) 50 MCG/ACT nasal spray Place 2 sprays into both nostrils daily. 16 mL 5   Fluticasone-Umeclidin-Vilant (TRELEGY ELLIPTA) 100-62.5-25 MCG/INH AEPB Inhale 1 puff into the lungs daily. 60 each 5   HYDROcodone bit-homatropine (HYCODAN) 5-1.5 MG/5ML syrup Take 5 mLs by mouth every 6 (six) hours as needed for cough. 240 mL 0   HYDROcodone bit-homatropine (HYDROMET) 5-1.5 MG/5ML syrup Take 5 mLs  by mouth every 6 (six) hours as needed for cough. 120 mL 0   lidocaine-prilocaine (EMLA) cream Apply 1 application topically as needed. 30 g 0   ondansetron (ZOFRAN) 8 MG tablet Take 1 tablet (8 mg total) by mouth every 8 (eight) hours as needed for nausea or vomiting. 20 tablet 2   prochlorperazine (COMPAZINE) 10 MG tablet Take 1 tablet (10 mg total) by mouth every 6 (six) hours as needed for nausea or vomiting. 30 tablet 2   XARELTO 20 MG TABS tablet TAKE 1 TABLET BY MOUTH DAILY WITH SUPPER 30 tablet 0   No current facility-administered medications for this visit.    PHYSICAL EXAMINATION: ECOG PERFORMANCE STATUS: 2 - Symptomatic, <50% confined to bed  Vitals:   12/29/21 1100  BP: (!) 157/85  Pulse: 80  Resp: (!) 21  SpO2: 98%   Wt Readings from Last 3 Encounters:  12/29/21 120 lb 4.8 oz (54.6 kg)  12/10/21 117 lb (53.1 kg)  11/10/21 119 lb 4.8 oz (54.1 kg)     GENERAL:alert, no distress and comfortable SKIN: skin color normal, no rashes or significant lesions EYES: normal, Conjunctiva are pink and non-injected, sclera clear  NEURO: alert & oriented x 3 with fluent speech  LABORATORY DATA:  I have reviewed the data as listed CBC Latest Ref Rng  & Units 12/29/2021 12/10/2021 11/10/2021  WBC 4.0 - 10.5 K/uL 5.3 6.4 7.1  Hemoglobin 12.0 - 15.0 g/dL 12.6 13.1 12.5  Hematocrit 36.0 - 46.0 % 37.1 39.4 36.9  Platelets 150 - 400 K/uL 160 238 265     CMP Latest Ref Rng & Units 12/29/2021 12/10/2021 11/10/2021  Glucose 70 - 99 mg/dL 96 121(H) 83  BUN 8 - 23 mg/dL _0 Creatinine 0.44 - 1.00 mg/dL 0.97 1.13(H) 0.98  Sodium 135 - 145 mmol/L 133(L) 136 139  Potassium 3.5 - 5.1 mmol/L 3.6 3.8 4.1  Chloride 98 - 111 mmol/L 101 101 104  CO2 22 - 32 mmol/L _1 Calcium 8.9 - 10.3 mg/dL 8.6(L) 9.4 9.5  Total Protein 6.5 - 8.1 g/dL 6.6 7.3 7.2  Total Bilirubin 0.3 - 1.2 mg/dL 1.1 0.7 0.6  Alkaline Phos 38 - 126 U/L 113 126 127(H)  AST 15 - 41 U/L _2 ALT 0 - 44 U/L _3 RADIOGRAPHIC STUDIES: I have personally reviewed the radiological images as listed and agreed with the findings in the report. No results found.    No orders of the defined types were placed in this encounter.  All questions were answered. The patient knows to call the clinic with any problems, questions or concerns. No barriers to learning was detected. The total time spent in the appointment was 30 minutes.     [name], MD 12/29/2021   I, Wilburn Mylar, am acting as scribe for [name], MD.   I have reviewed the above documentation for accuracy and completeness, and I agree with the above.

## 2021-12-31 ENCOUNTER — Telehealth: Payer: Self-pay | Admitting: Hematology

## 2021-12-31 NOTE — Telephone Encounter (Signed)
Scheduled follow-up appointments per 1/16 los. Patient is aware. °

## 2022-01-01 ENCOUNTER — Telehealth: Payer: Self-pay

## 2022-01-01 NOTE — Telephone Encounter (Signed)
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 Recent scan 12/25/21 was stable with no new disease, so we will watch this closely now that she has restarted beva. Let us know if she has questions/concerns. If it continues to rise we'll repeat scan sooner.  Thanks, Regan Rakers, NP

## 2022-01-01 NOTE — Telephone Encounter (Signed)
This nurse attempted to reach patient related information below.  Left a message to return call to clinic or to check her My Chart.  No further concerns at this time.

## 2022-01-06 ENCOUNTER — Other Ambulatory Visit: Payer: Self-pay | Admitting: Hematology

## 2022-01-15 DIAGNOSIS — Z933 Colostomy status: Secondary | ICD-10-CM | POA: Diagnosis not present

## 2022-01-16 MED FILL — Dexamethasone Sodium Phosphate Inj 100 MG/10ML: INTRAMUSCULAR | Qty: 1 | Status: AC

## 2022-01-18 NOTE — Progress Notes (Signed)
[place]   [phone]) [phone] [phone]) [phone]   Clinic Follow up Note   Patient Care Team: [name], DO as PCP - General (Family Medicine) [name], MD as Consulting Physician (Hematology) Icard, Octavio Graves, DO as Consulting Physician (Pulmonary Disease) Wilderness Rim, P.A. [name], MD as Consulting Physician (General Surgery) [name], MD as Consulting Physician (General Surgery) [name], [name], MD as Consulting Physician (Gastroenterology) 01/19/2022  CHIEF COMPLAINT: Follow up colon cancer   SUMMARY OF ONCOLOGIC HISTORY: Oncology History Overview Note  Cancer Staging Rectal cancer [facility]) Staging form: Colon and Rectum, AJCC 8th Edition - Pathologic stage from 03/03/2020: Stage IIIB (pT4a, pN1b, cM0) - Signed by [name], MD on 01/28/2021 Stage prefix: Initial diagnosis Histologic grading system: 4 grade system Histologic grade (G): G2 Residual tumor (R): R0 - None    Rectal cancer ([place])  03/03/2020 - 04/05/2020 Hospital Admission   She was admitted to ED on 03/03/20 for abdominal pain and nausea. She had been having diarrhea for several weeks. During hospital stay she developed acute respiratory failure, AKI, RUE DVT from PICC line. Work up showed bowel perforation, small left liver mass and thickening of jejunum. She underwent emergent surgery on 03/03/20 for resection and colostomy placement. Her path showed invasive cancer, metastatic to 3/15 LNs. She had a NGT in placed but this was removed on POD 3. Post op her stoma became necrotic and septic. She had another bowel surgery on 03/12/20, which was NED with necrotic tissue.    03/03/2020 Imaging   CT AP 03/03/20  IMPRESSION: 1. Free intraperitoneal air consistent perforation. Most likely site of perforation is in the distal splenic flexure/proximal descending colon where there is a collection of air and gas measuring 6 centimeters. No obvious soft tissue mass  identified in this region. At this site, there is abrupt transition of dilated, stool-filled colon to completely decompressed proximal descending colon. 2. Thickened, inflamed loops of jejunum are identified within the pelvis and are likely reactive. 3. Small hiatal hernia. 4. Benign-appearing 1.6 centimeter mass the LEFT hepatic lobe. Recommend comparison with prior studies if available. 5.  Emphysema (ICD10-J43.9). 6.  Aortic Atherosclerosis (ICD10-I70.0). 7. Bilateral renal scarring.   03/03/2020 Surgery   low anterior resection end colostomy by Dr Marcello Moores    03/03/2020 Initial Biopsy   FINAL MICROSCOPIC DIAGNOSIS: 03/03/20 A. RECTOSIGMOID COLON, LOW ANTERIOR RESECTION:  - Invasive colonic adenocarcinoma, 3.5 cm.  - Tumor invades the visceral peritoneum.  - Margins of resection are not involved.  - Metastatic carcinoma in (3) of (15) lymph nodes.  - See oncology table.   B. ADDITIONAL SIGMOID COLON, RESECTION:  - Colonic tissue, negative for carcinoma.  ADDENDUM:  Mismatch Repair Protein (IHC)   SUMMARY INTERPRETATION: NORMAL  There is preserved expression of the major MMR proteins. There is a very  low probability that microsatellite instability (MSI) is present.  However, certain clinically significant MMR protein mutations may result  in preservation of nuclear expression. It is recommended that the  preservation of protein expression be correlated with molecular based  MSI testing.   IHC EXPRESSION RESULTS  TEST           RESULT  MLH1:          Preserved nuclear expression  MSH2:          Preserved nuclear expression  MSH6:          Preserved nuclear expression  PMS2:  Preserved nuclear expression   03/03/2020 Cancer Staging   Staging form: Colon and Rectum, AJCC 8th Edition - Pathologic stage from 03/03/2020: Stage IIIB (pT4a, pN1b, cM0) - Signed by [name], MD on 01/28/2021 Stage prefix: Initial diagnosis Histologic grading system: 4 grade  system Histologic grade (G): G2 Residual tumor (R): R0 - None    03/08/2020 Imaging   CT AP 03/08/20 IMPRESSION: 1. Interval midline laparotomy with distal colon resection and diverting left lower quadrant colostomy. 2. Diffuse small bowel dilatation with gas fluid levels most consistent with postoperative ileus. 3. Trace ascites within the abdomen and pelvis. No fluid collection or abscess at this time. Surgical drain within the lower pelvis. 4. Indeterminate 1.4 cm subcapsular liver hypodensity. In light of newly diagnosed rectal cancer, metastatic disease cannot be excluded. PET CT may be useful for further evaluation. 5. Interval development of trace bilateral pleural effusions and diffuse body wall edema.   03/12/2020 Surgery   EXPLORATORY LAPAROTOMY WITH BOWEL RESECTION AND COLOSTOMY by Dr Hassell Done 03/12/20  FINAL MICROSCOPIC DIAGNOSIS: 03/12/20  A. COLON, SPLENIC FLEXURE, RESECTION:  - Segment of colon (37 cm) with perforation and associated inflammation  - Multiple mucosal ulcers with necrotizing inflammation  - No evidence of malignancy    03/12/2020 Imaging   CT AP WO contrast 03/12/20  IMPRESSION: 1. Exam is limited by lack of intravenous contrast, motion, and artifact from the patient's arms adjacent to the torso. Since 03/08/2020, there has been development of relatively large pockets of intraperitoneal free air. While intraperitoneal gas would not be unexpected on postoperative day 9, this gas is new since an intervening study of 03/08/2020 and given the relatively large volume certainly raises concern for bowel perforation. No source for the intraperitoneal free air is evident on this study. There is a surgical drain in the pelvis, but there is no free gas around the drain itself to suggest that it represents the source. 2. New circumferential wall thickening in the splenic flexure, descending colon and sigmoid colon leading into the end  colostomy. Infection/inflammation would be a consideration. Ischemia cannot be excluded. 3. Relatively small volume intraperitoneal free fluid. High attenuation small fluid collections in the left upper abdomen may reflect hemorrhage, infection or residua from prior perforation. 4. Interval progression of diffuse body wall edema. 5. Residual contrast material in the renal parenchyma from prior imaging, compatible with renal dysfunction.    03/19/2020 Imaging   CT CAP 03/19/20  IMPRESSION: 1. 5.5 x 3.2 cm fluid collection is noted along the greater curvature of the proximal stomach. 2. Surgical drain is again noted in the pelvis with tip in left lower quadrant. 3. Interval development of crescent-shaped fluid collection measuring 16.4 x 3.3 cm in the epigastric region and left upper quadrant of the abdomen which may extend into the left lower quadrant. Potentially this may represent abscess or developing abscess. Multiple other smaller fluid collections are noted which may represent small abscesses. 4. 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 5. Mild amount of free fluid is noted in the posterior pelvis. 6. Moderate anasarca is noted. Aortic Atherosclerosis (ICD10-I70.0).   04/12/2020 Imaging   CT AP 04/12/20  IMPRESSION: 1. Fluid collections within the LEFT abdomen have significantly decreased compared to previous CT exams, now nearly completely resolved. 2. Percutaneous drainage catheter with tip coiled posterior to the LEFT kidney, stable positioning compared to the previous study. The more anterior catheter has been removed. 3. Small amount of free fluid persists within the abdomen and pelvis.  4. Trace bibasilar pleural effusions. 5. Anasarca. 6. While reviewing today's study, comparing with a chest/abdomen/pelvis CT from earlier same month, there is question of thrombus in the LEFT internal jugular vein. Recommend ultrasound of the LEFT IJ to exclude DVT. This  recommendation discussed with patient's hospitalist, Dr. Owens Shark, on 04/12/2020 at 4:20 p.m.   Emphysema (ICD10-J43.9).   04/22/2020 Imaging   CT AP 04/22/20  IMPRESSION: 1. No recurrent intra-abdominal abscess status post interval removal of left retroperitoneal percutaneous drain. Stable small amount of free pelvic fluid. 2. Enlarging dependent bilateral pleural effusions, now moderate in volume. Associated atelectasis at both lung bases. 3. Progressive anasarca with generalized edema throughout the subcutaneous fat. 4. Stable small subcapsular fluid collection along the anterior aspect of the left hepatic lobe. 5. Aortic Atherosclerosis (ICD10-I70.0).   05/23/2020 Initial Diagnosis   Rectal cancer ([place])   01/14/2021 Imaging   CT CAP IMPRESSION: -5.0 cm soft tissue mass along the posterior aspect of the cecum at the base of the appendix, favored to reflect a peritoneal/serosal implant, corresponding to the patient's known adenocarcinoma. This is new from the prior. -Suspected additional pelvic implants along the uterus and right adnexa, poorly visualized, new/progressive from the prior. -Additional pericapsular lesion along the lateral spleen is mildly progressive, suspicious for additional peritoneal implant. -No evidence of metastatic disease in the chest.   01/27/2021 PET scan   IMPRESSION:  1. Extensive hypermetabolic peritoneal metastasis, as detailed  above.  2. No evidence of hypermetabolic supradiaphragmatic disease.  3. Diffuse low-level thyroid hypermetabolism can be seen in the  setting of thyroiditis. Consider correlation with thyroid function  labs.  4. Aortic atherosclerosis (ICD10-I70.0) and emphysema (ICD10-J43.9).    02/01/2021 -  Chemotherapy    First-line chemo Xeloda 1500 mg twice daily for day 1-14, every 21 days, started on 02/01/2021. Reduced to 1 week on/ 1 week off and added bevacizumab q2weeks from C2 on 02/24/21.  ----Given good tolerance, I changed to  CAPOX and bevacizumab q2weeks from C4 on 03/24/21 with Xeloda 1577m in the AM and 10049min the PM 1 week on/1 week off.     05/08/2021 Imaging   PET  IMPRESSION: 1. There is been interval development of several FDG avid pulmonary nodules within both lungs. Additionally, there is new FDG avid low left paratracheal lymph node. Thoracic metastasis cannot be excluded. 2. Interval improvement in multifocal FDG avid peritoneal metastasis as detailed above.     08/14/2021 Imaging   CT CAP  IMPRESSION: Resolution of previously seen small bilateral pulmonary nodules since previous study.   Stable 8 mm mediastinal lymph node in the AP window.   No evidence of metastatic disease within the abdomen or pelvis.   Nonocclusive pulmonary embolism in bilateral central lower lobe pulmonary arteries, which appears subacute in age.   12/25/2021 Imaging   EXAM: CT CHEST, ABDOMEN, AND PELVIS WITH CONTRAST  IMPRESSION: 1. Minimal, bandlike residua of previously seen FDG avid pulmonary nodules, consistent with treated metastases. No new pulmonary nodules. 2. Unchanged post treatment appearance of the cecal base, with soft tissue thickening about the cecal base and adjacent pelvis, previously FDG avid and in keeping with known appendiceal orifice malignancy. 3. Unchanged central fluid attenuation lesions about the spleen, left iliopsoas, and left pelvic sidewall, previously FDG avid, consistent with treated peritoneal implants. 4. No evidence of new metastatic disease in the chest, abdomen, or pelvis 5. Status post rectosigmoid colon resection with left lower quadrant end colostomy.   Aortic Atherosclerosis (ICD10-I70.0).   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 - Polypoid lesion obliterating appendiceal orifice. Biopsied to  evaluate for malignancy. - The examined portion of the ileum was normal. - Two large polyps in the cecum, not removed today pending path at appendiceal orifice, bowel prep. If removed endoscopically in the future would favor EMR to be done at the hospital. - One 5 mm polyp at the ileocecal valve, removed with a cold snare. Resected and retrieved. - One 3 mm polyp in the transverse colon, removed with a cold snare. Resected and retrieved. - Parastomal hernia making initial entry to the distal colon challenging. - The examination was otherwise normal.   01/06/2021 Initial Biopsy   Diagnosis 1. Colon, biopsy, appendiceal oraface - ADENOCARCINOMA. 2. Colon, polyp(s), ileocecal valve, transverse, x2 - TUBULAR ADENOMA, NEGATIVE FOR HIGH GRADE DYSPLASIA (X1). - COLONIC MUCOSA WITH UNDERLYING LYMPHOID AGGREGATE, NEGATIVE FOR DYSPLASIA (X1).  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 This is new from the prior. -Suspected additional pelvic implants along the uterus and right adnexa, poorly visualized, new/progressive from the prior. -Additional pericapsular lesion along the lateral spleen is mildly progressive, suspicious for additional peritoneal implant. -No evidence of metastatic disease in the chest.   08/14/2021 Imaging   CT CAP IMPRESSION: Resolution of previously seen small bilateral pulmonary nodules since previous study. Stable 8 mm mediastinal lymph node in the AP window. No evidence of metastatic disease within the abdomen or  pelvis. Nonocclusive pulmonary embolism in bilateral central lower lobe pulmonary arteries, which appears subacute in age.  Aortic Atherosclerosis (ICD10-I70.0) and Emphysema (ICD10-J43.9).     08/14/2021 Imaging   CT CAP  IMPRESSION: Resolution of previously seen small bilateral pulmonary nodules since previous study.   Stable 8 mm mediastinal lymph node in the AP window.   No evidence of metastatic disease within the abdomen or pelvis.   Nonocclusive pulmonary embolism in bilateral central lower lobe pulmonary arteries, which appears subacute in age.   12/25/2021 Imaging   EXAM: CT CHEST, ABDOMEN, AND PELVIS WITH CONTRAST  IMPRESSION: 1. Minimal, bandlike residua of previously seen FDG avid pulmonary nodules, consistent with treated metastases. No new pulmonary nodules. 2. Unchanged post treatment appearance of the cecal base, with soft tissue thickening about the cecal base and adjacent pelvis, previously FDG avid and in keeping with known appendiceal orifice malignancy. 3. Unchanged central fluid attenuation lesions about the spleen, left iliopsoas, and left pelvic sidewall, previously FDG avid, consistent with treated peritoneal implants. 4. No evidence of new metastatic disease in the chest, abdomen, or pelvis 5. Status post rectosigmoid colon resection with left lower quadrant end colostomy.   Aortic Atherosclerosis (ICD10-I70.0).     CURRENT THERAPY:  CAPOX -Xeloda started 02/01/21, currently 1500 mg BID 7 days on/7 days off -Bevacizumab q2weeks added with C2 02/24/21             -Beva held 09/02/21-12/28/21 d/t PE -Oxaliplatin added  with C4 03/24/21, currently q3weeks  INTERVAL HISTORY: Ms. Moragne returns for follow up and treatment as scheduled. Last seen by Dr. Burr Medico 12/29/21, she restarted beva and continues oxali q3 weeks with xeloda 1 week on/1 week off. She started the current cycle this morning.  She is doing well overall, no new complaints except itching at the  base of her head/neck and pelvic area.  There is no rash or redness. She takes a Benadryl daily for allergies.  Bowels stay slightly loose which she prefers, better for her colostomy bag.  Denies nausea/vomiting or any abdominal pain.  Cough and dyspnea are at baseline, compliant with Xarelto.  She has some bruising on the lower legs, denies bleeding.  Denies any fever, chills, chest pain, worsening neuropathy, or any other specific complaints.   MEDICAL HISTORY:  Past Medical History:  Diagnosis Date   Acute deep vein thrombosis (DVT) of brachial vein of right upper extremity (HCC)    Acute on chronic respiratory failure with hypoxia (HCC)    Asthma    Cataract    CHICKENPOX, HX OF 01/06/2011   Qualifier: Diagnosis of  By: Charlett Blake MD, Stacey     Colon cancer [facility])    COPD (chronic obstructive pulmonary disease) ([place]) 2011   FeV1 31% predicted FeV1/FVX 47 %   COVID-19 05/2021   Essential hypertension 05/23/2020   Hemorrhoid    Open right radial fracture 2020   Osteopenia    Perforated sigmoid colon ([place]) 03/03/2020   Pleural effusion    Pneumonia 2021   Postoperative intra-abdominal abscess 2021   Rectal cancer (HCC)    Seasonal allergies    takes Claritin daily prn   Shock circulatory ([place])    Tracheostomy status ([place])    Vitamin D deficiency    takes Vit d every 14 days    SURGICAL HISTORY: Past Surgical History:  Procedure Laterality Date   AUGMENTATION MAMMAPLASTY     saline   EXAMINATION UNDER ANESTHESIA  10/14/2012   Procedure: EXAM UNDER ANESTHESIA;  Surgeon: Gayland Curry, MD,FACS;  Location: [place];  Service: General;  Laterality: N/A;  rectal exam under anesthesia excisional hemorrhoidectomy hemorrhoidal banding x two   EXAMINATION UNDER ANESTHESIA  02/03/2013   excision hemorrhoidal tissue   FOOT SURGERY     left bunionectomy   HEMORRHOIDECTOMY WITH HEMORRHOID BANDING  10/14/2012   Procedure: HEMORRHOIDECTOMY WITH HEMORRHOID BANDING;  Surgeon: Gayland Curry, MD,FACS;   Location: [place];  Service: General;  Laterality: N/A;   IR IMAGING GUIDED PORT INSERTION  02/03/2021   IR THORACENTESIS ASP PLEURAL SPACE W/IMG GUIDE  04/23/2020   LAPAROTOMY N/A 03/03/2020   Procedure: low anterior resection end colostomy;  Surgeon: [name], MD;  Location: [facility];  Service: General;  Laterality: N/A;   LAPAROTOMY N/A 03/12/2020   Procedure: EXPLORATORY LAPAROTOMY WITH BOWEL RESECTION AND COLOSTOMY;  Surgeon: [name], MD;  Location: [facility];  Service: General;  Laterality: N/A;   OPEN REDUCTION INTERNAL FIXATION (ORIF) DISTAL RADIAL FRACTURE Right 11/01/2018   Procedure: OPEN REDUCTION INTERNAL FIXATION (ORIF) DISTAL RADIAL FRACTURE;  Surgeon: [name], MD;  Location: [place];  Service: Orthopedics;  Laterality: Right;   TONSILLECTOMY  1960   recurrent otitis media   US ECHOCARDIOGRAPHY  02/2020    poor windows, normal LV function, severely dilated RV with moderately reduced function, RV volume and pressure overload, mildly dilated RA    I have reviewed the social history and family history with the patient and  they are unchanged from previous note.  ALLERGIES:  is allergic to amlodipine and codeine.  MEDICATIONS:  Current Outpatient Medications  Medication Sig Dispense Refill   albuterol (PROVENTIL) (2.5 MG/3ML) 0.083% nebulizer solution Take 3 mLs (2.5 mg total) by nebulization every 4 (four) hours as needed for wheezing or shortness of breath. DX: J44.9 360 mL 5   albuterol (VENTOLIN HFA) 108 (90 Base) MCG/ACT inhaler Inhale 2 puffs into the lungs every 6 (six) hours as needed for wheezing or shortness of breath. 8 g 5   ALPRAZolam (XANAX) 0.25 MG tablet Take 1 tablet (0.25 mg total) by mouth at bedtime as needed for anxiety. 30 tablet 0   capecitabine (XELODA) 500 MG tablet Take 3 tablets (1,500 mg total) by mouth 2 (two) times daily after a meal. Take for 7 days on, 7 days off, repeat every 14 days. 84 tablet 1   diphenhydrAMINE  (BENADRYL) 25 mg capsule Take 25 mg by mouth every 6 (six) hours as needed.     docusate sodium (COLACE) 50 MG capsule Take 50 mg by mouth daily as needed for mild constipation.     fluticasone (FLONASE) 50 MCG/ACT nasal spray Place 2 sprays into both nostrils daily. 16 mL 5   Fluticasone-Umeclidin-Vilant (TRELEGY ELLIPTA) 100-62.5-25 MCG/INH AEPB Inhale 1 puff into the lungs daily. 60 each 5   HYDROcodone bit-homatropine (HYCODAN) 5-1.5 MG/5ML syrup Take 5 mLs by mouth every 6 (six) hours as needed for cough. 240 mL 0   HYDROcodone bit-homatropine (HYDROMET) 5-1.5 MG/5ML syrup Take 5 mLs by mouth every 6 (six) hours as needed for cough. 120 mL 0   lidocaine-prilocaine (EMLA) cream Apply 1 application topically as needed. 30 g 0   ondansetron (ZOFRAN) 8 MG tablet Take 1 tablet (8 mg total) by mouth every 8 (eight) hours as needed for nausea or vomiting. 20 tablet 2   prochlorperazine (COMPAZINE) 10 MG tablet Take 1 tablet (10 mg total) by mouth every 6 (six) hours as needed for nausea or vomiting. 30 tablet 2   XARELTO 20 MG TABS tablet TAKE 1 TABLET BY MOUTH DAILY WITH SUPPER 30 tablet 0   No current facility-administered medications for this visit.   Facility-Administered Medications Ordered in Other Visits  Medication Dose Route Frequency Provider Last Rate Last Admin   dextrose 5 % solution   Intravenous Once [name], MD       heparin lock flush 100 unit/mL  500 Units Intracatheter Once PRN [name], MD       oxaliplatin (ELOXATIN) 80 mg in dextrose 5 % 500 mL chemo infusion  50 mg/m2 (Treatment Plan Recorded) Intravenous Once [name], MD       sodium chloride flush (NS) 0.9 % injection 10 mL  10 mL Intracatheter PRN [name], MD        PHYSICAL EXAMINATION: ECOG PERFORMANCE STATUS: 1 - Symptomatic but completely ambulatory  Vitals:   01/19/22 1034  BP: (!) 154/89  Pulse: 76  Resp: 15  Temp: 97.7 F (36.5 C)  SpO2: 97%   Filed Weights   01/19/22 1034  Weight: 118 lb 8 oz  (53.8 kg)    GENERAL:alert, no distress and comfortable SKIN: no rash  EYES: sclera clear LUNGS: Decreased throughout with normal breathing effort HEART: regular rate & rhythm, no lower extremity edema ABDOMEN:abdomen soft, non-tender and normal bowel sounds NEURO: alert & oriented x 3 with fluent speech, no focal motor/sensory deficits.  Intact peripheral vibratory sense over the fingertips per tuning fork  exam PAC without erythema   LABORATORY DATA:  I have reviewed the data as listed CBC Latest Ref Rng & Units 01/19/2022 12/29/2021 12/10/2021  WBC 4.0 - 10.5 K/uL 5.0 5.3 6.4  Hemoglobin 12.0 - 15.0 g/dL 12.2 12.6 13.1  Hematocrit 36.0 - 46.0 % 36.3 37.1 39.4  Platelets 150 - 400 K/uL 290 160 238     CMP Latest Ref Rng & Units 01/19/2022 12/29/2021 12/10/2021  Glucose 70 - 99 mg/dL 97 96 121(H)  BUN 8 - 23 mg/dL 16 20 19   Creatinine 0.44 - 1.00 mg/dL 0.96 0.97 1.13(H)  Sodium 135 - 145 mmol/L 139 133(L) 136  Potassium 3.5 - 5.1 mmol/L 3.8 3.6 3.8  Chloride 98 - 111 mmol/L 104 101 101  CO2 22 - 32 mmol/L 26 24 24   Calcium 8.9 - 10.3 mg/dL 9.5 8.6(L) 9.4  Total Protein 6.5 - 8.1 g/dL 6.8 6.6 7.3  Total Bilirubin 0.3 - 1.2 mg/dL 0.6 1.1 0.7  Alkaline Phos 38 - 126 U/L 129(H) 113 126  AST 15 - 41 U/L 24 22 24   ALT 0 - 44 U/L 12 13 12       RADIOGRAPHIC STUDIES: I have personally reviewed the radiological images as listed and agreed with the findings in the report. No results found.   ASSESSMENT & PLAN: 71 yo female   1. [name], MMR normal, peritoneal metastasis in 01/2021 -diagnosed 02/2020, due to perforation underwent emergent surgery. Path showed invasive adenocarcinoma with perforation, +3/15 LNs, and clear margins. Initial CT scan negative for distant metastasis. -due to bowel perforation and + LNs she has very high risk for recurrence, unfortunately she was not a candidate for adjuvant chemo due to very slow recovery from  surgery -Guardant Reveal ctDNA were all negative x3 (06/06/20, 07/12/20, and 09/10/20). -surveillance CT 08/27/20 showed a small density adjacent to the spleen that had decreased, no other evidence of recurrent or metastatic disease -first surveillance colonoscopy by Dr. Havery Moros 01/06/21 showed new adenocarcinoma at appendiceal orifice and other tubular adenomas throughout the colon.  Biopsy confirmed adenocarcinoma -01/27/2021 PET showed tumor in appendiceal orifice is likely peritoneal metastasis growing into the cecum.  Her peritoneal implants are mainly in the pelvis, difficult to biopsy -She began single agent Xeloda with cycle 1 on 2/19.  Due to low performance status with cycle 2 was changed to 1 week on/1 week off, and beva was added. She tolerates this well -PS improved and low dose oxaliplatin was added with C4, now on Capeox/beva q2 weeks, with Xeloda 1 week on/1 week off. -she is responding well. She is now receiving oxali q3 weeks. 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  -Beva was held for PE found on CT 08/2021, she is on xarelto. Resumed beva q3 weeks 12/29/21   2. Bilateral PE, found on restaging CT CAP 08/14/21  -no hypoxia, stable respiratory status  -no need for inpatient management, she began xarelto 08/15/21 -Beva was on hold, resumed 12/29/2021   3. COVID-19 + 05/21/21 and respiratory infection -she received CAPOX/beva on 05/19/21, tested positive on 05/21/21 -treated with paxlovid and subsequently doxycycline, augment, and prednisone  -CXR 06/23/21 showed only emphysema -resolved    4. H/o provoked RUE DVT 04/04/20 s/p 3 months AC (Eliquis)   5. Comorbidities: Asthma, COPD, anxiety -on nebulizer and trelegy, has not needed rescue inhaler recently -f/up pulmonology and PCP   Disposition: Ms. Guardado appears stable.  She continues Xeloda 1500 mg twice  daily 1 week on and 1 week off, oxaliplatin every 3 weeks, and resumed bevacizumab 12/29/2021.  She is  tolerating treatment well with stable mild neuropathy, otherwise no significant side effects.  She has mild skin itching without rash, on Benadryl for chronic allergies.  I recommend to add Pepcid.  She is able to recover and function well from treatment.  There is no clinical evidence of disease progression.  Labs reviewed, adequate to proceed with oxaliplatin and bevacizumab today as scheduled.  She will continue the current Xeloda cycle which she began this morning. CEA was normal until it began fluctuating in the last month, 5.1 -- 9.15 -- down to 6.6 today. Will watch it closely.   Follow-up in exercycle in 3 weeks, or sooner if needed.  All questions were answered. The patient knows to call the clinic with any problems, questions or concerns. No barriers to learning was detected. I spent 20 minutes counseling the patient face to face. The total time spent in the appointment was 30 minutes and more than 50% was on counseling and review of test results     Alla Feeling, NP 01/19/22

## 2022-01-19 ENCOUNTER — Other Ambulatory Visit: Payer: Self-pay

## 2022-01-19 ENCOUNTER — Encounter: Payer: Self-pay | Admitting: Nurse Practitioner

## 2022-01-19 ENCOUNTER — Inpatient Hospital Stay: Payer: Medicare HMO

## 2022-01-19 ENCOUNTER — Inpatient Hospital Stay: Payer: Medicare HMO | Attending: Hematology

## 2022-01-19 ENCOUNTER — Inpatient Hospital Stay: Payer: Medicare HMO | Admitting: Nurse Practitioner

## 2022-01-19 VITALS — BP 154/89 | HR 76 | Temp 97.7°F | Resp 15 | Ht 62.0 in | Wt 118.5 lb

## 2022-01-19 DIAGNOSIS — Z86711 Personal history of pulmonary embolism: Secondary | ICD-10-CM | POA: Insufficient documentation

## 2022-01-19 DIAGNOSIS — Z5112 Encounter for antineoplastic immunotherapy: Secondary | ICD-10-CM | POA: Diagnosis present

## 2022-01-19 DIAGNOSIS — Z933 Colostomy status: Secondary | ICD-10-CM | POA: Diagnosis not present

## 2022-01-19 DIAGNOSIS — C786 Secondary malignant neoplasm of retroperitoneum and peritoneum: Secondary | ICD-10-CM | POA: Diagnosis not present

## 2022-01-19 DIAGNOSIS — M8580 Other specified disorders of bone density and structure, unspecified site: Secondary | ICD-10-CM | POA: Diagnosis not present

## 2022-01-19 DIAGNOSIS — C799 Secondary malignant neoplasm of unspecified site: Secondary | ICD-10-CM

## 2022-01-19 DIAGNOSIS — C181 Malignant neoplasm of appendix: Secondary | ICD-10-CM | POA: Insufficient documentation

## 2022-01-19 DIAGNOSIS — Z79899 Other long term (current) drug therapy: Secondary | ICD-10-CM | POA: Insufficient documentation

## 2022-01-19 DIAGNOSIS — Z7901 Long term (current) use of anticoagulants: Secondary | ICD-10-CM | POA: Insufficient documentation

## 2022-01-19 DIAGNOSIS — Z5111 Encounter for antineoplastic chemotherapy: Secondary | ICD-10-CM | POA: Diagnosis present

## 2022-01-19 DIAGNOSIS — Z86718 Personal history of other venous thrombosis and embolism: Secondary | ICD-10-CM | POA: Diagnosis not present

## 2022-01-19 DIAGNOSIS — I10 Essential (primary) hypertension: Secondary | ICD-10-CM | POA: Diagnosis not present

## 2022-01-19 DIAGNOSIS — C20 Malignant neoplasm of rectum: Secondary | ICD-10-CM

## 2022-01-19 LAB — CBC WITH DIFFERENTIAL (CANCER CENTER ONLY)
Abs Immature Granulocytes: 0.02 10*3/uL (ref 0.00–0.07)
Basophils Absolute: 0 10*3/uL (ref 0.0–0.1)
Basophils Relative: 1 %
Eosinophils Absolute: 0.1 10*3/uL (ref 0.0–0.5)
Eosinophils Relative: 2 %
HCT: 36.3 % (ref 36.0–46.0)
Hemoglobin: 12.2 g/dL (ref 12.0–15.0)
Immature Granulocytes: 0 %
Lymphocytes Relative: 24 %
Lymphs Abs: 1.2 10*3/uL (ref 0.7–4.0)
MCH: 35.6 pg — ABNORMAL HIGH (ref 26.0–34.0)
MCHC: 33.6 g/dL (ref 30.0–36.0)
MCV: 105.8 fL — ABNORMAL HIGH (ref 80.0–100.0)
Monocytes Absolute: 0.8 10*3/uL (ref 0.1–1.0)
Monocytes Relative: 15 %
Neutro Abs: 2.9 10*3/uL (ref 1.7–7.7)
Neutrophils Relative %: 58 %
Platelet Count: 290 10*3/uL (ref 150–400)
RBC: 3.43 MIL/uL — ABNORMAL LOW (ref 3.87–5.11)
RDW: 15.6 % — ABNORMAL HIGH (ref 11.5–15.5)
WBC Count: 5 10*3/uL (ref 4.0–10.5)
nRBC: 0 % (ref 0.0–0.2)

## 2022-01-19 LAB — CMP (CANCER CENTER ONLY)
ALT: 12 U/L (ref 0–44)
AST: 24 U/L (ref 15–41)
Albumin: 4 g/dL (ref 3.5–5.0)
Alkaline Phosphatase: 129 U/L — ABNORMAL HIGH (ref 38–126)
Anion gap: 9 (ref 5–15)
BUN: 16 mg/dL (ref 8–23)
CO2: 26 mmol/L (ref 22–32)
Calcium: 9.5 mg/dL (ref 8.9–10.3)
Chloride: 104 mmol/L (ref 98–111)
Creatinine: 0.96 mg/dL (ref 0.44–1.00)
GFR, Estimated: >60 mL/min (ref >60)
Glucose, Bld: 97 mg/dL (ref 70–99)
Potassium: 3.8 mmol/L (ref 3.5–5.1)
Sodium: 139 mmol/L (ref 135–145)
Total Bilirubin: 0.6 mg/dL (ref 0.3–1.2)
Total Protein: 6.8 g/dL (ref 6.5–8.1)

## 2022-01-19 LAB — TOTAL PROTEIN, URINE DIPSTICK: Protein, ur: <30 mg/dL — AB

## 2022-01-19 LAB — CEA (IN HOUSE-CHCC): CEA (CHCC-In House): 6.6 ng/mL — ABNORMAL HIGH (ref 0.00–5.00)

## 2022-01-19 MED ORDER — SODIUM CHLORIDE 0.9% FLUSH
10.0000 mL | INTRAVENOUS | Status: DC | PRN
  Administered 2022-01-19: 10 mL

## 2022-01-19 MED ORDER — SODIUM CHLORIDE 0.9 % IV SOLN: Freq: Once | INTRAVENOUS | Status: AC

## 2022-01-19 MED ORDER — DEXTROSE 5 % IV SOLN: Freq: Once | INTRAVENOUS | Status: AC

## 2022-01-19 MED ORDER — SODIUM CHLORIDE 0.9 % IV SOLN
10.0000 mg | Freq: Once | INTRAVENOUS | Status: AC
  Administered 2022-01-19: 10 mg via INTRAVENOUS
  Filled 2022-01-19: qty 10

## 2022-01-19 MED ORDER — SODIUM CHLORIDE 0.9 % IV SOLN
7.5000 mg/kg | Freq: Once | INTRAVENOUS | Status: AC
  Administered 2022-01-19: 400 mg via INTRAVENOUS
  Filled 2022-01-19: qty 16

## 2022-01-19 MED ORDER — PALONOSETRON HCL INJECTION 0.25 MG/5ML
0.2500 mg | Freq: Once | INTRAVENOUS | Status: AC
  Administered 2022-01-19: 0.25 mg via INTRAVENOUS
  Filled 2022-01-19: qty 5

## 2022-01-19 MED ORDER — SODIUM CHLORIDE 0.9% FLUSH
10.0000 mL | Freq: Once | INTRAVENOUS | Status: AC
  Administered 2022-01-19: 10 mL

## 2022-01-19 MED ORDER — OXALIPLATIN CHEMO INJECTION 100 MG/20ML
50.0000 mg/m2 | Freq: Once | INTRAVENOUS | Status: AC
  Administered 2022-01-19: 80 mg via INTRAVENOUS
  Filled 2022-01-19: qty 16

## 2022-01-19 MED ORDER — HEPARIN SOD (PORK) LOCK FLUSH 100 UNIT/ML IV SOLN
500.0000 [IU] | Freq: Once | INTRAVENOUS | Status: AC | PRN
  Administered 2022-01-19: 500 [IU]

## 2022-01-19 NOTE — Patient Instructions (Signed)
[facility]   Discharge Instructions: Thank you for choosing [facility] to provide your oncology and hematology care.   If you have a lab appointment with the [facility], please go directly to the [facility] and check in at the registration area.   Wear comfortable clothing and clothing appropriate for easy access to any Portacath or PICC line.   We strive to give you quality time with your provider. You may need to reschedule your appointment if you arrive late (15 or more minutes).  Arriving late affects you and other patients whose appointments are after yours.  Also, if you miss three or more appointments without notifying the office, you may be dismissed from the clinic at the providers discretion.      For prescription refill requests, have your pharmacy contact our office and allow 72 hours for refills to be completed.    Today you received the following chemotherapy and/or immunotherapy agents: oxaliplatin and bevacizumab-bvvr      To help prevent nausea and vomiting after your treatment, we encourage you to take your nausea medication as directed.  BELOW ARE SYMPTOMS THAT SHOULD BE REPORTED IMMEDIATELY: *FEVER GREATER THAN 100.4 F (38 C) OR HIGHER *CHILLS OR SWEATING *NAUSEA AND VOMITING THAT IS NOT CONTROLLED WITH YOUR NAUSEA MEDICATION *UNUSUAL SHORTNESS OF BREATH *UNUSUAL BRUISING OR BLEEDING *URINARY PROBLEMS (pain or burning when urinating, or frequent urination) *BOWEL PROBLEMS (unusual diarrhea, constipation, pain near the anus) TENDERNESS IN MOUTH AND THROAT WITH OR WITHOUT PRESENCE OF ULCERS (sore throat, sores in mouth, or a toothache) UNUSUAL RASH, SWELLING OR PAIN  UNUSUAL VAGINAL DISCHARGE OR ITCHING   Items with * indicate a potential emergency and should be followed up as soon as possible or go to the Emergency Department if any problems should occur.  Please show the CHEMOTHERAPY ALERT CARD or IMMUNOTHERAPY  ALERT CARD at check-in to the Emergency Department and triage nurse.  Should you have questions after your visit or need to cancel or reschedule your appointment, please contact [facility]  Dept: [phone]  and follow the prompts.  Office hours are 8:00 a.m. to 4:30 p.m. Monday - Friday. Please note that voicemails left after 4:00 p.m. may not be returned until the following business day.  We are closed weekends and major holidays. You have access to a nurse at all times for urgent questions. Please call the main number to the clinic Dept: [phone] and follow the prompts.   For any non-urgent questions, you may also contact your provider using MyChart. We now offer e-Visits for anyone 8 and older to request care online for non-urgent symptoms. For details visit [email or web address].   Also download the MyChart app! Go to the app store, search "MyChart", open the app, select [facility], and log in with your MyChart username and password.  Due to Covid, a mask is required upon entering the hospital/clinic. If you do not have a mask, one will be given to you upon arrival. For doctor visits, patients may have 1 support person aged 38 or older with them. For treatment visits, patients cannot have anyone with them due to current Covid guidelines and our immunocompromised population.

## 2022-01-20 ENCOUNTER — Telehealth: Payer: Self-pay | Admitting: Hematology

## 2022-01-20 NOTE — Telephone Encounter (Signed)
Scheduled follow-up appointment per 2/6 los. Patient is aware. °

## 2022-02-06 MED FILL — Dexamethasone Sodium Phosphate Inj 100 MG/10ML: INTRAMUSCULAR | Qty: 1 | Status: AC

## 2022-02-07 ENCOUNTER — Other Ambulatory Visit: Payer: Self-pay | Admitting: Hematology

## 2022-02-09 ENCOUNTER — Inpatient Hospital Stay: Payer: Medicare HMO | Admitting: Hematology

## 2022-02-09 ENCOUNTER — Encounter: Payer: Self-pay | Admitting: Hematology

## 2022-02-09 ENCOUNTER — Inpatient Hospital Stay: Payer: Medicare HMO

## 2022-02-09 ENCOUNTER — Other Ambulatory Visit: Payer: Self-pay

## 2022-02-09 VITALS — BP 129/64 | HR 67 | Resp 17

## 2022-02-09 VITALS — BP 158/93 | HR 93 | Resp 18 | Ht 62.0 in | Wt 117.7 lb

## 2022-02-09 DIAGNOSIS — C181 Malignant neoplasm of appendix: Secondary | ICD-10-CM

## 2022-02-09 DIAGNOSIS — C20 Malignant neoplasm of rectum: Secondary | ICD-10-CM

## 2022-02-09 DIAGNOSIS — Z86718 Personal history of other venous thrombosis and embolism: Secondary | ICD-10-CM | POA: Diagnosis not present

## 2022-02-09 DIAGNOSIS — Z5111 Encounter for antineoplastic chemotherapy: Secondary | ICD-10-CM | POA: Diagnosis not present

## 2022-02-09 DIAGNOSIS — M8580 Other specified disorders of bone density and structure, unspecified site: Secondary | ICD-10-CM | POA: Diagnosis not present

## 2022-02-09 DIAGNOSIS — Z7901 Long term (current) use of anticoagulants: Secondary | ICD-10-CM | POA: Diagnosis not present

## 2022-02-09 DIAGNOSIS — Z933 Colostomy status: Secondary | ICD-10-CM | POA: Diagnosis not present

## 2022-02-09 DIAGNOSIS — I10 Essential (primary) hypertension: Secondary | ICD-10-CM | POA: Diagnosis not present

## 2022-02-09 DIAGNOSIS — Z5112 Encounter for antineoplastic immunotherapy: Secondary | ICD-10-CM | POA: Diagnosis not present

## 2022-02-09 DIAGNOSIS — Z79899 Other long term (current) drug therapy: Secondary | ICD-10-CM | POA: Diagnosis not present

## 2022-02-09 DIAGNOSIS — C799 Secondary malignant neoplasm of unspecified site: Secondary | ICD-10-CM

## 2022-02-09 DIAGNOSIS — Z86711 Personal history of pulmonary embolism: Secondary | ICD-10-CM | POA: Diagnosis not present

## 2022-02-09 DIAGNOSIS — C786 Secondary malignant neoplasm of retroperitoneum and peritoneum: Secondary | ICD-10-CM | POA: Diagnosis not present

## 2022-02-09 LAB — CBC WITH DIFFERENTIAL (CANCER CENTER ONLY)
Abs Immature Granulocytes: 0.01 10*3/uL (ref 0.00–0.07)
Basophils Absolute: 0 10*3/uL (ref 0.0–0.1)
Basophils Relative: 1 %
Eosinophils Absolute: 0.1 10*3/uL (ref 0.0–0.5)
Eosinophils Relative: 3 %
HCT: 36.8 % (ref 36.0–46.0)
Hemoglobin: 12.3 g/dL (ref 12.0–15.0)
Immature Granulocytes: 0 %
Lymphocytes Relative: 26 %
Lymphs Abs: 0.9 10*3/uL (ref 0.7–4.0)
MCH: 35.4 pg — ABNORMAL HIGH (ref 26.0–34.0)
MCHC: 33.4 g/dL (ref 30.0–36.0)
MCV: 106.1 fL — ABNORMAL HIGH (ref 80.0–100.0)
Monocytes Absolute: 0.4 10*3/uL (ref 0.1–1.0)
Monocytes Relative: 11 %
Neutro Abs: 2.1 10*3/uL (ref 1.7–7.7)
Neutrophils Relative %: 59 %
Platelet Count: 240 10*3/uL (ref 150–400)
RBC: 3.47 MIL/uL — ABNORMAL LOW (ref 3.87–5.11)
RDW: 14.6 % (ref 11.5–15.5)
WBC Count: 3.6 10*3/uL — ABNORMAL LOW (ref 4.0–10.5)
nRBC: 0 % (ref 0.0–0.2)

## 2022-02-09 LAB — CEA (IN HOUSE-CHCC): CEA (CHCC-In House): 6.2 ng/mL — ABNORMAL HIGH (ref 0.00–5.00)

## 2022-02-09 LAB — CMP (CANCER CENTER ONLY)
ALT: 10 U/L (ref 0–44)
AST: 20 U/L (ref 15–41)
Albumin: 4 g/dL (ref 3.5–5.0)
Alkaline Phosphatase: 113 U/L (ref 38–126)
Anion gap: 8 (ref 5–15)
BUN: 19 mg/dL (ref 8–23)
CO2: 25 mmol/L (ref 22–32)
Calcium: 9.2 mg/dL (ref 8.9–10.3)
Chloride: 101 mmol/L (ref 98–111)
Creatinine: 0.92 mg/dL (ref 0.44–1.00)
GFR, Estimated: >60 mL/min (ref >60)
Glucose, Bld: 114 mg/dL — ABNORMAL HIGH (ref 70–99)
Potassium: 3.9 mmol/L (ref 3.5–5.1)
Sodium: 134 mmol/L — ABNORMAL LOW (ref 135–145)
Total Bilirubin: 0.8 mg/dL (ref 0.3–1.2)
Total Protein: 6.9 g/dL (ref 6.5–8.1)

## 2022-02-09 LAB — TOTAL PROTEIN, URINE DIPSTICK: Protein, ur: 30 mg/dL — AB

## 2022-02-09 MED ORDER — SODIUM CHLORIDE 0.9 % IV SOLN
400.0000 mg | Freq: Once | INTRAVENOUS | Status: AC
  Administered 2022-02-09: 400 mg via INTRAVENOUS
  Filled 2022-02-09: qty 16

## 2022-02-09 MED ORDER — SODIUM CHLORIDE 0.9 % IV SOLN
10.0000 mg | Freq: Once | INTRAVENOUS | Status: AC
  Administered 2022-02-09: 10 mg via INTRAVENOUS
  Filled 2022-02-09: qty 10

## 2022-02-09 MED ORDER — SODIUM CHLORIDE 0.9% FLUSH
10.0000 mL | Freq: Once | INTRAVENOUS | Status: AC
  Administered 2022-02-09: 10 mL

## 2022-02-09 MED ORDER — HEPARIN SOD (PORK) LOCK FLUSH 100 UNIT/ML IV SOLN
500.0000 [IU] | Freq: Once | INTRAVENOUS | Status: AC | PRN
  Administered 2022-02-09: 500 [IU]

## 2022-02-09 MED ORDER — DEXTROSE 5 % IV SOLN: Freq: Once | INTRAVENOUS | Status: AC

## 2022-02-09 MED ORDER — SODIUM CHLORIDE 0.9 % IV SOLN: Freq: Once | INTRAVENOUS | Status: AC

## 2022-02-09 MED ORDER — OXALIPLATIN CHEMO INJECTION 100 MG/20ML
50.0000 mg/m2 | Freq: Once | INTRAVENOUS | Status: AC
  Administered 2022-02-09: 80 mg via INTRAVENOUS
  Filled 2022-02-09: qty 16

## 2022-02-09 MED ORDER — CAPECITABINE 500 MG PO TABS: 1000.0000 mg/m2 | ORAL_TABLET | Freq: Two times a day (BID) | ORAL | 1 refills | Status: DC

## 2022-02-09 MED ORDER — PALONOSETRON HCL INJECTION 0.25 MG/5ML
0.2500 mg | Freq: Once | INTRAVENOUS | Status: AC
  Administered 2022-02-09: 0.25 mg via INTRAVENOUS
  Filled 2022-02-09: qty 5

## 2022-02-09 MED ORDER — SODIUM CHLORIDE 0.9% FLUSH
10.0000 mL | INTRAVENOUS | Status: DC | PRN
  Administered 2022-02-09: 10 mL

## 2022-02-09 NOTE — Patient Instructions (Signed)

## 2022-02-09 NOTE — Patient Instructions (Signed)
[facility]  Discharge Instructions: Thank you for choosing [facility] to provide your oncology and hematology care.   If you have a lab appointment with the [facility], please go directly to the [facility] and check in at the registration area.   Wear comfortable clothing and clothing appropriate for easy access to any Portacath or PICC line.   We strive to give you quality time with your provider. You may need to reschedule your appointment if you arrive late (15 or more minutes).  Arriving late affects you and other patients whose appointments are after yours.  Also, if you miss three or more appointments without notifying the office, you may be dismissed from the clinic at the providers discretion.      For prescription refill requests, have your pharmacy contact our office and allow 72 hours for refills to be completed.    Today you received the following chemotherapy and/or immunotherapy agents Zirabev & Oxaliplatin      To help prevent nausea and vomiting after your treatment, we encourage you to take your nausea medication as directed.  BELOW ARE SYMPTOMS THAT SHOULD BE REPORTED IMMEDIATELY: *FEVER GREATER THAN 100.4 F (38 C) OR HIGHER *CHILLS OR SWEATING *NAUSEA AND VOMITING THAT IS NOT CONTROLLED WITH YOUR NAUSEA MEDICATION *UNUSUAL SHORTNESS OF BREATH *UNUSUAL BRUISING OR BLEEDING *URINARY PROBLEMS (pain or burning when urinating, or frequent urination) *BOWEL PROBLEMS (unusual diarrhea, constipation, pain near the anus) TENDERNESS IN MOUTH AND THROAT WITH OR WITHOUT PRESENCE OF ULCERS (sore throat, sores in mouth, or a toothache) UNUSUAL RASH, SWELLING OR PAIN  UNUSUAL VAGINAL DISCHARGE OR ITCHING   Items with * indicate a potential emergency and should be followed up as soon as possible or go to the Emergency Department if any problems should occur.  Please show the CHEMOTHERAPY ALERT CARD or IMMUNOTHERAPY ALERT CARD at  check-in to the Emergency Department and triage nurse.  Should you have questions after your visit or need to cancel or reschedule your appointment, please contact [facility]  Dept: [phone]  and follow the prompts.  Office hours are 8:00 a.m. to 4:30 p.m. Monday - Friday. Please note that voicemails left after 4:00 p.m. may not be returned until the following business day.  We are closed weekends and major holidays. You have access to a nurse at all times for urgent questions. Please call the main number to the clinic Dept: [phone] and follow the prompts.   For any non-urgent questions, you may also contact your provider using MyChart. We now offer e-Visits for anyone 79 and older to request care online for non-urgent symptoms. For details visit [email or web address].   Also download the MyChart app! Go to the app store, search "MyChart", open the app, select [facility], and log in with your MyChart username and password.  Due to Covid, a mask is required upon entering the hospital/clinic. If you do not have a mask, one will be given to you upon arrival. For doctor visits, patients may have 1 support person aged 62 or older with them. For treatment visits, patients cannot have anyone with them due to current Covid guidelines and our immunocompromised population.

## 2022-02-09 NOTE — Progress Notes (Signed)
[place]   [phone] [phone]   Clinic Follow up Note   Patient Care Team: [name], DO as PCP - General (Family Medicine) [name], MD as Consulting Physician (Hematology) Icard, Octavio Graves, DO as Consulting Physician (Pulmonary Disease) [place], P.A. [name], MD as Consulting Physician (General Surgery) [name], MD as Consulting Physician (General Surgery) [name], [name], MD as Consulting Physician (Gastroenterology)  Date of Service:  02/09/2022  CHIEF COMPLAINT: f/u of colon cancer  CURRENT THERAPY:  CAPOX -Xeloda started 02/01/21, currently 1500 mg BID 7 days on/7 days off -Bevacizumab q2weeks added with C2 02/24/21             -Beva held 09/02/21-12/28/21 d/t PE -Oxaliplatin added with C4 03/24/21, currently q3weeks  ASSESSMENT & PLAN:  Tracy [name] is a 71 y.o. female with   1. [name], MMR normal, peritoneal metastasis in 01/2021 -diagnosed 02/2020, due to perforation underwent emergent surgery. Path showed invasive adenocarcinoma with perforation, +3/15 LNs, and clear margins. Initial CT scan negative for distant metastasis. -due to bowel perforation and + LNs she has very high risk for recurrence, unfortunately she was not a candidate for adjuvant chemo due to very slow recovery from surgery -Guardant Reveal ctDNA were all negative x3 (06/06/20, 07/12/20, and 09/10/20). -surveillance CT 08/27/20 showed a small density adjacent to the spleen that had decreased, no other evidence of recurrent or metastatic disease -first surveillance colonoscopy by Dr. Havery Moros 01/06/21 showed new adenocarcinoma at appendiceal orifice and other tubular adenomas throughout the colon.  Biopsy confirmed adenocarcinoma -01/27/21 PET showed tumor in appendiceal orifice is likely peritoneal metastasis growing into the cecum.  Her peritoneal implants are mainly in the pelvis,  difficult to biopsy -She began single agent Xeloda with cycle 1 on 2/19.  Due to low performance status with cycle 2 was changed to 1 week on/1 week off, and beva was added. She tolerates this well -PS improved and low dose oxaliplatin was added with C4, now on Capeox/beva q2 weeks, with Xeloda 1 week on/1 week off. -she is responding well. She is now receiving oxali q3 weeks. 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  -Beva was held for PE found on CT 08/2021, she is on xarelto. Resumed beva q3 weeks 12/29/21 -labs reviewed, overall stable and adequate to proceed with treatment. Will plan for restaging scan after next cycle.   2. Bilateral PE, found on restaging CT CAP 08/14/21  -no hypoxia, stable respiratory status  -no need for inpatient management, she began xarelto 08/15/21 -Beva was on hold, resumed 12/29/2021   3. COVID-19 + 05/21/21 and respiratory infection -she received CAPOX/beva on 05/19/21, tested positive on 05/21/21 -treated with paxlovid and subsequently doxycycline, augment, and prednisone  -CXR 06/23/21 showed only emphysema -resolved    4. H/o provoked RUE DVT 04/04/20 s/p 3 months AC (Eliquis)   5. Comorbidities: Asthma, COPD, anxiety -on nebulizer and trelegy, has not needed rescue inhaler recently -f/up pulmonology and PCP   PLAN: -proceed with oxali/Beva today -lab, flush, f/u, and oxali/beva in 3 weeks as scheduled  -will order restaging scan at that visit   No problem-specific Assessment & Plan notes found for this encounter.   SUMMARY OF ONCOLOGIC HISTORY: Oncology History Overview Note  Cancer Staging Rectal cancer [facility]) Staging form: Colon and Rectum, AJCC 8th Edition - Pathologic stage from 03/03/2020: Stage IIIB (pT4a, pN1b, cM0) - Signed by [name],  MD on 01/28/2021 Stage prefix: Initial diagnosis Histologic grading system: 4 grade system Histologic grade (G): G2 Residual tumor (R): R0 - None    Rectal cancer ([place])   03/03/2020 - 04/05/2020 Hospital Admission   She was admitted to ED on 03/03/20 for abdominal pain and nausea. She had been having diarrhea for several weeks. During hospital stay she developed acute respiratory failure, AKI, RUE DVT from PICC line. Work up showed bowel perforation, small left liver mass and thickening of jejunum. She underwent emergent surgery on 03/03/20 for resection and colostomy placement. Her path showed invasive cancer, metastatic to 3/15 LNs. She had a NGT in placed but this was removed on POD 3. Post op her stoma became necrotic and septic. She had another bowel surgery on 03/12/20, which was NED with necrotic tissue.    03/03/2020 Imaging   CT AP 03/03/20  IMPRESSION: 1. Free intraperitoneal air consistent perforation. Most likely site of perforation is in the distal splenic flexure/proximal descending colon where there is a collection of air and gas measuring 6 centimeters. No obvious soft tissue mass identified in this region. At this site, there is abrupt transition of dilated, stool-filled colon to completely decompressed proximal descending colon. 2. Thickened, inflamed loops of jejunum are identified within the pelvis and are likely reactive. 3. Small hiatal hernia. 4. Benign-appearing 1.6 centimeter mass the LEFT hepatic lobe. Recommend comparison with prior studies if available. 5.  Emphysema (ICD10-J43.9). 6.  Aortic Atherosclerosis (ICD10-I70.0). 7. Bilateral renal scarring.   03/03/2020 Surgery   low anterior resection end colostomy by Dr Marcello Moores    03/03/2020 Initial Biopsy   FINAL MICROSCOPIC DIAGNOSIS: 03/03/20 A. RECTOSIGMOID COLON, LOW ANTERIOR RESECTION:  - Invasive colonic adenocarcinoma, 3.5 cm.  - Tumor invades the visceral peritoneum.  - Margins of resection are not involved.  - Metastatic carcinoma in (3) of (15) lymph nodes.  - See oncology table.   B. ADDITIONAL SIGMOID COLON, RESECTION:  - Colonic tissue, negative for  carcinoma.  ADDENDUM:  Mismatch Repair Protein (IHC)   SUMMARY INTERPRETATION: NORMAL  There is preserved expression of the major MMR proteins. There is a very  low probability that microsatellite instability (MSI) is present.  However, certain clinically significant MMR protein mutations may result  in preservation of nuclear expression. It is recommended that the  preservation of protein expression be correlated with molecular based  MSI testing.   IHC EXPRESSION RESULTS  TEST           RESULT  MLH1:          Preserved nuclear expression  MSH2:          Preserved nuclear expression  MSH6:          Preserved nuclear expression  PMS2:          Preserved nuclear expression   03/03/2020 Cancer Staging   Staging form: Colon and Rectum, AJCC 8th Edition - Pathologic stage from 03/03/2020: Stage IIIB (pT4a, pN1b, cM0) - Signed by [name], MD on 01/28/2021 Stage prefix: Initial diagnosis Histologic grading system: 4 grade system Histologic grade (G): G2 Residual tumor (R): R0 - None    03/08/2020 Imaging   CT AP 03/08/20 IMPRESSION: 1. Interval midline laparotomy with distal colon resection and diverting left lower quadrant colostomy. 2. Diffuse small bowel dilatation with gas fluid levels most consistent with postoperative ileus. 3. Trace ascites within the abdomen and pelvis. No fluid collection or abscess at this time. Surgical drain within the lower pelvis. 4. Indeterminate 1.4  cm subcapsular liver hypodensity. In light of newly diagnosed rectal cancer, metastatic disease cannot be excluded. PET CT may be useful for further evaluation. 5. Interval development of trace bilateral pleural effusions and diffuse body wall edema.   03/12/2020 Surgery   EXPLORATORY LAPAROTOMY WITH BOWEL RESECTION AND COLOSTOMY by Dr Hassell Done 03/12/20  FINAL MICROSCOPIC DIAGNOSIS: 03/12/20  A. COLON, SPLENIC FLEXURE, RESECTION:  - Segment of colon (37 cm) with perforation and associated inflammation   - Multiple mucosal ulcers with necrotizing inflammation  - No evidence of malignancy    03/12/2020 Imaging   CT AP WO contrast 03/12/20  IMPRESSION: 1. Exam is limited by lack of intravenous contrast, motion, and artifact from the patient's arms adjacent to the torso. Since 03/08/2020, there has been development of relatively large pockets of intraperitoneal free air. While intraperitoneal gas would not be unexpected on postoperative day 9, this gas is new since an intervening study of 03/08/2020 and given the relatively large volume certainly raises concern for bowel perforation. No source for the intraperitoneal free air is evident on this study. There is a surgical drain in the pelvis, but there is no free gas around the drain itself to suggest that it represents the source. 2. New circumferential wall thickening in the splenic flexure, descending colon and sigmoid colon leading into the end colostomy. Infection/inflammation would be a consideration. Ischemia cannot be excluded. 3. Relatively small volume intraperitoneal free fluid. High attenuation small fluid collections in the left upper abdomen may reflect hemorrhage, infection or residua from prior perforation. 4. Interval progression of diffuse body wall edema. 5. Residual contrast material in the renal parenchyma from prior imaging, compatible with renal dysfunction.    03/19/2020 Imaging   CT CAP 03/19/20  IMPRESSION: 1. 5.5 x 3.2 cm fluid collection is noted along the greater curvature of the proximal stomach. 2. Surgical drain is again noted in the pelvis with tip in left lower quadrant. 3. Interval development of crescent-shaped fluid collection measuring 16.4 x 3.3 cm in the epigastric region and left upper quadrant of the abdomen which may extend into the left lower quadrant. Potentially this may represent abscess or developing abscess. Multiple other smaller fluid collections are noted which may represent small  abscesses. 4. 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 5. Mild amount of free fluid is noted in the posterior pelvis. 6. Moderate anasarca is noted. Aortic Atherosclerosis (ICD10-I70.0).   04/12/2020 Imaging   CT AP 04/12/20  IMPRESSION: 1. Fluid collections within the LEFT abdomen have significantly decreased compared to previous CT exams, now nearly completely resolved. 2. Percutaneous drainage catheter with tip coiled posterior to the LEFT kidney, stable positioning compared to the previous study. The more anterior catheter has been removed. 3. Small amount of free fluid persists within the abdomen and pelvis. 4. Trace bibasilar pleural effusions. 5. Anasarca. 6. While reviewing today's study, comparing with a chest/abdomen/pelvis CT from earlier same month, there is question of thrombus in the LEFT internal jugular vein. Recommend ultrasound of the LEFT IJ to exclude DVT. This recommendation discussed with patient's hospitalist, Dr. Owens Shark, on 04/12/2020 at 4:20 p.m.   Emphysema (ICD10-J43.9).   04/22/2020 Imaging   CT AP 04/22/20  IMPRESSION: 1. No recurrent intra-abdominal abscess status post interval removal of left retroperitoneal percutaneous drain. Stable small amount of free pelvic fluid. 2. Enlarging dependent bilateral pleural effusions, now moderate in volume. Associated atelectasis at both lung bases. 3. Progressive anasarca with generalized edema throughout the subcutaneous fat. 4. Stable small subcapsular  fluid collection along the anterior aspect of the left hepatic lobe. 5. Aortic Atherosclerosis (ICD10-I70.0).   05/23/2020 Initial Diagnosis   Rectal cancer ([place])   01/14/2021 Imaging   CT CAP IMPRESSION: -5.0 cm soft tissue mass along the posterior aspect of the cecum at the base of the appendix, favored to reflect a peritoneal/serosal implant, corresponding to the patient's known adenocarcinoma. This is new from the prior. -Suspected additional  pelvic implants along the uterus and right adnexa, poorly visualized, new/progressive from the prior. -Additional pericapsular lesion along the lateral spleen is mildly progressive, suspicious for additional peritoneal implant. -No evidence of metastatic disease in the chest.   01/27/2021 PET scan   IMPRESSION:  1. Extensive hypermetabolic peritoneal metastasis, as detailed  above.  2. No evidence of hypermetabolic supradiaphragmatic disease.  3. Diffuse low-level thyroid hypermetabolism can be seen in the  setting of thyroiditis. Consider correlation with thyroid function  labs.  4. Aortic atherosclerosis (ICD10-I70.0) and emphysema (ICD10-J43.9).    02/01/2021 -  Chemotherapy    First-line chemo Xeloda 1500 mg twice daily for day 1-14, every 21 days, started on 02/01/2021. Reduced to 1 week on/ 1 week off and added bevacizumab q2weeks from C2 on 02/24/21.  ----Given good tolerance, I changed to CAPOX and bevacizumab q2weeks from C4 on 03/24/21 with Xeloda 1547m in the AM and 10025min the PM 1 week on/1 week off.     05/08/2021 Imaging   PET  IMPRESSION: 1. There is been interval development of several FDG avid pulmonary nodules within both lungs. Additionally, there is new FDG avid low left paratracheal lymph node. Thoracic metastasis cannot be excluded. 2. Interval improvement in multifocal FDG avid peritoneal metastasis as detailed above.     08/14/2021 Imaging   CT CAP  IMPRESSION: Resolution of previously seen small bilateral pulmonary nodules since previous study.   Stable 8 mm mediastinal lymph node in the AP window.   No evidence of metastatic disease within the abdomen or pelvis.   Nonocclusive pulmonary embolism in bilateral central lower lobe pulmonary arteries, which appears subacute in age.   12/25/2021 Imaging   EXAM: CT CHEST, ABDOMEN, AND PELVIS WITH CONTRAST  IMPRESSION: 1. Minimal, bandlike residua of previously seen FDG avid pulmonary nodules,  consistent with treated metastases. No new pulmonary nodules. 2. Unchanged post treatment appearance of the cecal base, with soft tissue thickening about the cecal base and adjacent pelvis, previously FDG avid and in keeping with known appendiceal orifice malignancy. 3. Unchanged central fluid attenuation lesions about the spleen, left iliopsoas, and left pelvic sidewall, previously FDG avid, consistent with treated peritoneal implants. 4. No evidence of new metastatic disease in the chest, abdomen, or pelvis 5. Status post rectosigmoid colon resection with left lower quadrant end colostomy.   Aortic Atherosclerosis (ICD10-I70.0).   Malignant neoplasm of appendix ([facility] [place] 01/06/2021 Procedure   (surveillance colonoscopy for h/o rectal cancer) Impression:  - Preparation of the colon was fair, lavage performed with mostly adequate views. - Polypoid lesion obliterating appendiceal orifice. Biopsied to evaluate for malignancy. - The examined portion of the ileum was normal. - Two large polyps in the cecum, not removed today pending path at appendiceal orifice, bowel prep. If removed endoscopically in the future would favor EMR to be done at the hospital. - One 5 mm polyp at the ileocecal valve, removed with a cold snare. Resected and retrieved. - One 3 mm polyp in the transverse colon, removed with a cold snare. Resected and retrieved. - Parastomal hernia making  initial entry to the distal colon challenging. - The examination was otherwise normal.   01/06/2021 Initial Biopsy   Diagnosis 1. Colon, biopsy, appendiceal oraface - ADENOCARCINOMA. 2. Colon, polyp(s), ileocecal valve, transverse, x2 - TUBULAR ADENOMA, NEGATIVE FOR HIGH GRADE DYSPLASIA (X1). - COLONIC MUCOSA WITH UNDERLYING LYMPHOID AGGREGATE, NEGATIVE FOR DYSPLASIA (X1).  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 This is new from the prior. -Suspected additional pelvic implants along the uterus and right adnexa, poorly visualized, new/progressive from the prior. -Additional pericapsular lesion along the lateral spleen is mildly progressive, suspicious for additional peritoneal implant. -No evidence of metastatic disease in the chest.   08/14/2021 Imaging   CT CAP IMPRESSION: Resolution of previously seen small bilateral pulmonary nodules since previous study. Stable 8 mm mediastinal lymph node in the AP window. No evidence of metastatic disease within the abdomen or pelvis. Nonocclusive pulmonary embolism in bilateral central lower lobe pulmonary arteries, which appears subacute in age.  Aortic Atherosclerosis (ICD10-I70.0) and Emphysema (ICD10-J43.9).     08/14/2021 Imaging   CT CAP  IMPRESSION: Resolution of previously seen small bilateral pulmonary nodules since previous study.   Stable 8 mm mediastinal lymph node in the AP window.   No evidence of metastatic disease within the abdomen or pelvis.   Nonocclusive pulmonary embolism in bilateral central lower lobe pulmonary arteries, which appears subacute in age.   12/25/2021 Imaging   EXAM: CT CHEST, ABDOMEN, AND PELVIS WITH CONTRAST  IMPRESSION: 1. Minimal, bandlike residua of previously seen FDG avid pulmonary nodules, consistent with treated metastases. No new pulmonary nodules. 2. Unchanged post treatment appearance of the cecal base, with soft tissue thickening about the cecal base and adjacent pelvis, previously FDG avid and in keeping with known appendiceal orifice malignancy. 3.  Unchanged central fluid attenuation lesions about the spleen, left iliopsoas, and left pelvic sidewall, previously FDG avid, consistent with treated peritoneal implants. 4. No evidence of new metastatic disease in the chest, abdomen, or pelvis 5. Status post rectosigmoid colon resection with left lower quadrant end colostomy.   Aortic Atherosclerosis (ICD10-I70.0).      INTERVAL HISTORY:  Tracy [name] is here for a follow up of colon cancer. She was last seen by NP Lacie on 01/19/22. She presents to the clinic alone. She reports she is doing well overall. She denies neuropathy.   All other systems were reviewed with the patient and are negative.  MEDICAL HISTORY:  Past Medical History:  Diagnosis Date   Acute deep vein thrombosis (DVT) of brachial vein of right upper extremity (HCC)    Acute on chronic respiratory failure with hypoxia (HCC)    Asthma    Cataract    CHICKENPOX, HX OF 01/06/2011   Qualifier: Diagnosis of  By: Charlett Blake MD, Stacey     Colon cancer [facility])    COPD (chronic obstructive pulmonary disease) ([place]) 2011   FeV1 31% predicted FeV1/FVX 47 %   COVID-19 05/2021   Essential hypertension 05/23/2020   Hemorrhoid    Open right radial fracture 2020  Osteopenia    Perforated sigmoid colon ([place]) 03/03/2020   Pleural effusion    Pneumonia 2021   Postoperative intra-abdominal abscess 2021   Rectal cancer (HCC)    Seasonal allergies    takes Claritin daily prn   Shock circulatory ([place])    Tracheostomy status ([place])    Vitamin D deficiency    takes Vit d every 14 days    SURGICAL HISTORY: Past Surgical History:  Procedure Laterality Date   AUGMENTATION MAMMAPLASTY     saline   EXAMINATION UNDER ANESTHESIA  10/14/2012   Procedure: EXAM UNDER ANESTHESIA;  Surgeon: Gayland Curry, MD,FACS;  Location: [place];  Service: General;  Laterality: N/A;  rectal exam under anesthesia excisional hemorrhoidectomy hemorrhoidal banding x two   EXAMINATION UNDER ANESTHESIA   02/03/2013   excision hemorrhoidal tissue   FOOT SURGERY     left bunionectomy   HEMORRHOIDECTOMY WITH HEMORRHOID BANDING  10/14/2012   Procedure: HEMORRHOIDECTOMY WITH HEMORRHOID BANDING;  Surgeon: Gayland Curry, MD,FACS;  Location: [place];  Service: General;  Laterality: N/A;   IR IMAGING GUIDED PORT INSERTION  02/03/2021   IR THORACENTESIS ASP PLEURAL SPACE W/IMG GUIDE  04/23/2020   LAPAROTOMY N/A 03/03/2020   Procedure: low anterior resection end colostomy;  Surgeon: [name], MD;  Location: [facility];  Service: General;  Laterality: N/A;   LAPAROTOMY N/A 03/12/2020   Procedure: EXPLORATORY LAPAROTOMY WITH BOWEL RESECTION AND COLOSTOMY;  Surgeon: [name], MD;  Location: [facility];  Service: General;  Laterality: N/A;   OPEN REDUCTION INTERNAL FIXATION (ORIF) DISTAL RADIAL FRACTURE Right 11/01/2018   Procedure: OPEN REDUCTION INTERNAL FIXATION (ORIF) DISTAL RADIAL FRACTURE;  Surgeon: [name], MD;  Location: [place];  Service: Orthopedics;  Laterality: Right;   TONSILLECTOMY  1960   recurrent otitis media   US ECHOCARDIOGRAPHY  02/2020    poor windows, normal LV function, severely dilated RV with moderately reduced function, RV volume and pressure overload, mildly dilated RA    I have reviewed the social history and family history with the patient and they are unchanged from previous note.  ALLERGIES:  is allergic to amlodipine and codeine.  MEDICATIONS:  Current Outpatient Medications  Medication Sig Dispense Refill   albuterol (PROVENTIL) (2.5 MG/3ML) 0.083% nebulizer solution Take 3 mLs (2.5 mg total) by nebulization every 4 (four) hours as needed for wheezing or shortness of breath. DX: J44.9 360 mL 5   albuterol (VENTOLIN HFA) 108 (90 Base) MCG/ACT inhaler Inhale 2 puffs into the lungs every 6 (six) hours as needed for wheezing or shortness of breath. 8 g 5   ALPRAZolam (XANAX) 0.25 MG tablet Take 1 tablet (0.25 mg total) by mouth at bedtime as needed for  anxiety. 30 tablet 0   capecitabine (XELODA) 500 MG tablet Take 3 tablets (1,500 mg total) by mouth 2 (two) times daily after a meal. Take for 7 days on, 7 days off, repeat every 14 days. 84 tablet 1   diphenhydrAMINE (BENADRYL) 25 mg capsule Take 25 mg by mouth every 6 (six) hours as needed.     docusate sodium (COLACE) 50 MG capsule Take 50 mg by mouth daily as needed for mild constipation.     fluticasone (FLONASE) 50 MCG/ACT nasal spray Place 2 sprays into both nostrils daily. 16 mL 5   Fluticasone-Umeclidin-Vilant (TRELEGY ELLIPTA) 100-62.5-25 MCG/INH AEPB Inhale 1 puff into the lungs daily. 60 each 5   lidocaine-prilocaine (EMLA) cream Apply 1 application topically as needed. 30 g 0  ondansetron (ZOFRAN) 8 MG tablet Take 1 tablet (8 mg total) by mouth every 8 (eight) hours as needed for nausea or vomiting. 20 tablet 2   prochlorperazine (COMPAZINE) 10 MG tablet Take 1 tablet (10 mg total) by mouth every 6 (six) hours as needed for nausea or vomiting. 30 tablet 2   XARELTO 20 MG TABS tablet TAKE 1 TABLET BY MOUTH DAILY WITH SUPPER 30 tablet 0   No current facility-administered medications for this visit.   Facility-Administered Medications Ordered in Other Visits  Medication Dose Route Frequency Provider Last Rate Last Admin   heparin lock flush 100 unit/mL  500 Units Intracatheter Once PRN [name], MD       oxaliplatin (ELOXATIN) 80 mg in dextrose 5 % 500 mL chemo infusion  50 mg/m2 (Treatment Plan Recorded) Intravenous Once [name], MD 258 mL/hr at 02/09/22 1346 80 mg at 02/09/22 1346   sodium chloride flush (NS) 0.9 % injection 10 mL  10 mL Intracatheter PRN [name], MD        PHYSICAL EXAMINATION: ECOG PERFORMANCE STATUS: 2 - Symptomatic, <50% confined to bed  Vitals:   02/09/22 1128  BP: (!) 158/93  Pulse: 93  Resp: 18  SpO2: 96%   Wt Readings from Last 3 Encounters:  02/09/22 117 lb 11.2 oz (53.4 kg)  01/19/22 118 lb 8 oz (53.8 kg)  12/29/21 120 lb 4.8 oz (54.6 kg)      GENERAL:alert, no distress and comfortable SKIN: skin color normal, no rashes or significant lesions EYES: normal, Conjunctiva are pink and non-injected, sclera clear  NEURO: alert & oriented x 3 with fluent speech  LABORATORY DATA:  I have reviewed the data as listed CBC Latest Ref Rng & Units 02/09/2022 01/19/2022 12/29/2021  WBC 4.0 - 10.5 K/uL 3.6(L) 5.0 5.3  Hemoglobin 12.0 - 15.0 g/dL 12.3 12.2 12.6  Hematocrit 36.0 - 46.0 % 36.8 36.3 37.1  Platelets 150 - 400 K/uL 240 290 160     CMP Latest Ref Rng & Units 02/09/2022 01/19/2022 12/29/2021  Glucose 70 - 99 mg/dL 114(H) 97 96  BUN 8 - 23 mg/dL 19 16 20   Creatinine 0.44 - 1.00 mg/dL 0.92 0.96 0.97  Sodium 135 - 145 mmol/L 134(L) 139 133(L)  Potassium 3.5 - 5.1 mmol/L 3.9 3.8 3.6  Chloride 98 - 111 mmol/L 101 104 101  CO2 22 - 32 mmol/L 25 26 24   Calcium 8.9 - 10.3 mg/dL 9.2 9.5 8.6(L)  Total Protein 6.5 - 8.1 g/dL 6.9 6.8 6.6  Total Bilirubin 0.3 - 1.2 mg/dL 0.8 0.6 1.1  Alkaline Phos 38 - 126 U/L 113 129(H) 113  AST 15 - 41 U/L 20 24 22   ALT 0 - 44 U/L 10 12 13       RADIOGRAPHIC STUDIES: I have personally reviewed the radiological images as listed and agreed with the findings in the report. No results found.    No orders of the defined types were placed in this encounter.  All questions were answered. The patient knows to call the clinic with any problems, questions or concerns. No barriers to learning was detected. The total time spent in the appointment was 30 minutes.     [name], MD 02/09/2022   I, Wilburn Mylar, am acting as scribe for [name], MD.   I have reviewed the above documentation for accuracy and completeness, and I agree with the above.

## 2022-02-10 ENCOUNTER — Other Ambulatory Visit (HOSPITAL_COMMUNITY): Payer: Self-pay

## 2022-02-11 ENCOUNTER — Encounter (INDEPENDENT_AMBULATORY_CARE_PROVIDER_SITE_OTHER): Payer: Medicare HMO | Admitting: Ophthalmology

## 2022-02-11 ENCOUNTER — Encounter: Payer: Self-pay | Admitting: Hematology

## 2022-02-11 ENCOUNTER — Other Ambulatory Visit: Payer: Self-pay

## 2022-02-11 DIAGNOSIS — H33303 Unspecified retinal break, bilateral: Secondary | ICD-10-CM | POA: Diagnosis not present

## 2022-02-11 DIAGNOSIS — H43813 Vitreous degeneration, bilateral: Secondary | ICD-10-CM | POA: Diagnosis not present

## 2022-02-11 DIAGNOSIS — H2513 Age-related nuclear cataract, bilateral: Secondary | ICD-10-CM | POA: Diagnosis not present

## 2022-02-16 IMAGING — CT CT ABD-PELV W/ CM
2 of 5 series · 15 of 46 positions shown, 17 images · IV contrast (APPLIED)
Comparison: April 22, 2020

CLINICAL DATA: Colorectal cancer surveillance

EXAM:
CT ABDOMEN AND PELVIS WITH CONTRAST
TECHNIQUE: Multidetector CT imaging of the abdomen and pelvis was performed
using the standard protocol following bolus administration of
intravenous contrast.
CONTRAST:  100mL OMNIPAQUE IOHEXOL 300 MG/ML  SOLN

[Series 2: axial st · axial · 0.73mm/px · z∈[-198,+137]mm · 12 of 79 slices shown, 14 images]
[im 6/79  soft-tissue]
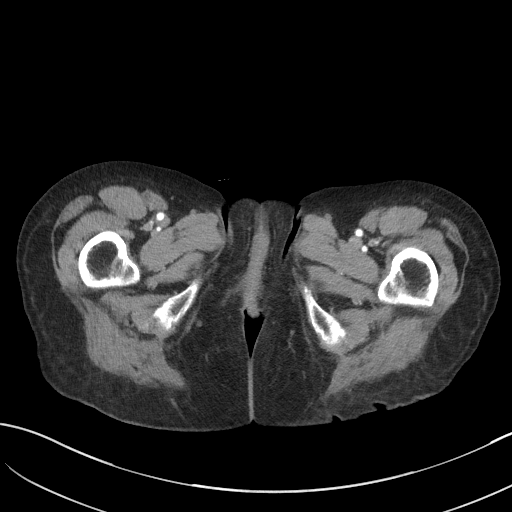
[im 6/79  bone]
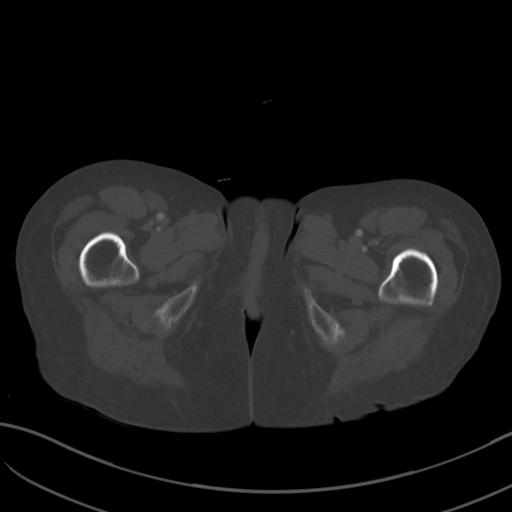
[im 12/79  soft-tissue]
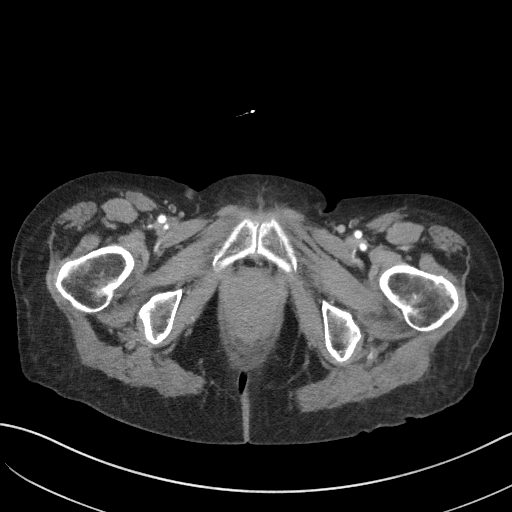
[im 17/79  soft-tissue]
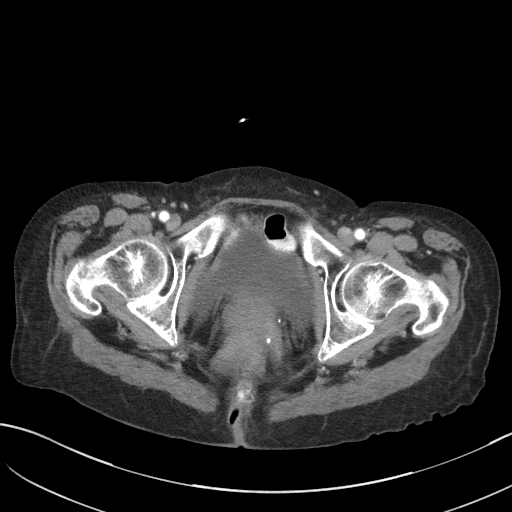
[im 23/79  soft-tissue]
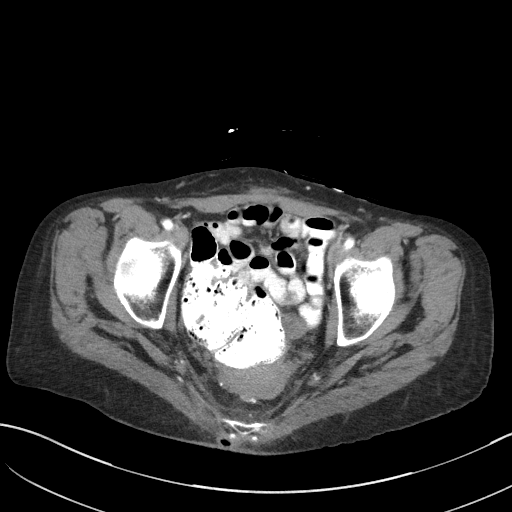
[im 28/79  soft-tissue]
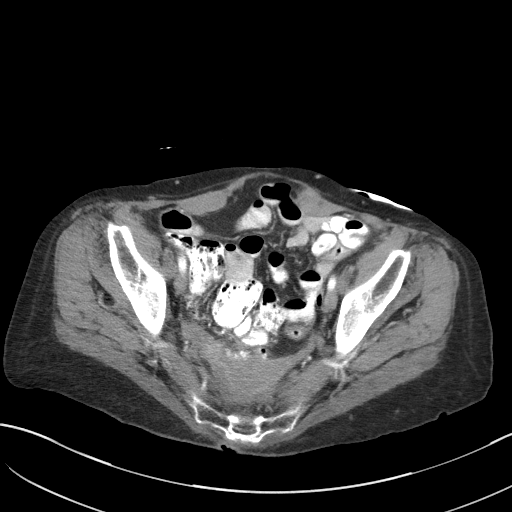
[im 34/79  soft-tissue]
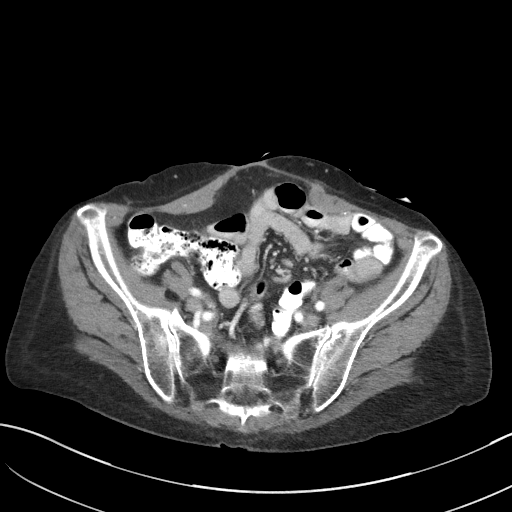
[im 45/79  soft-tissue]
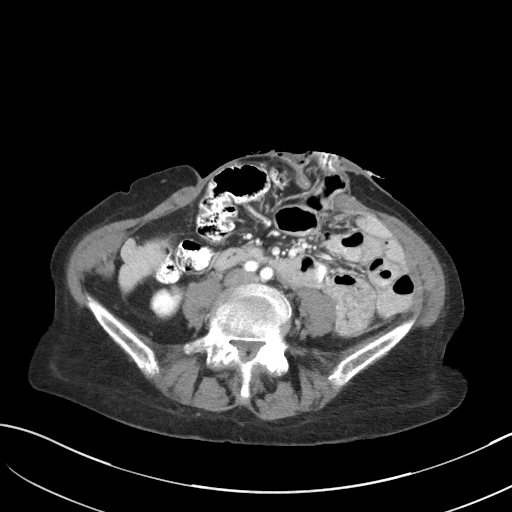
[im 51/79  soft-tissue]
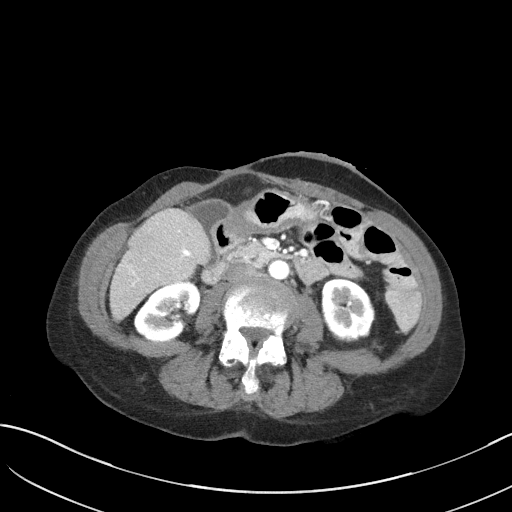
[im 56/79  soft-tissue]
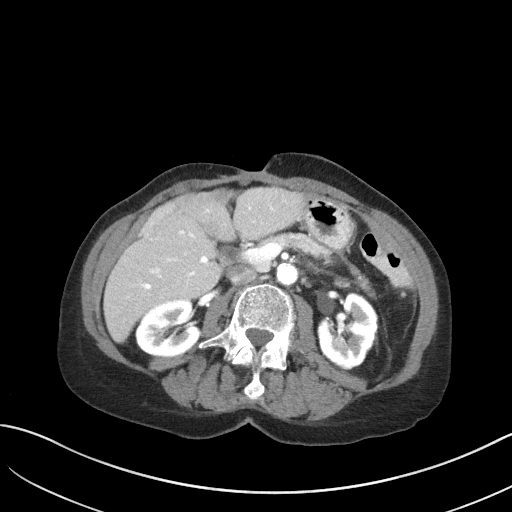
[im 56/79  bone]
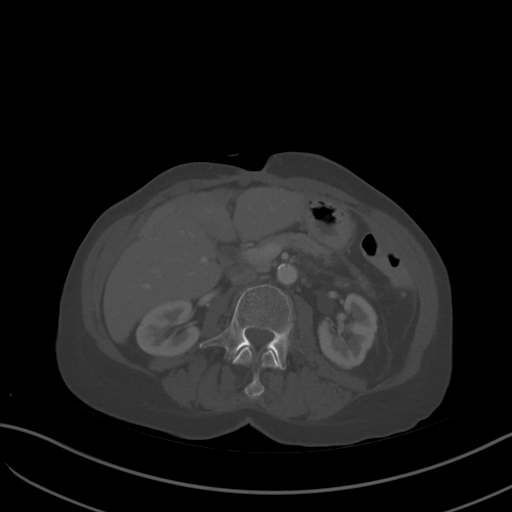
[im 62/79  soft-tissue]
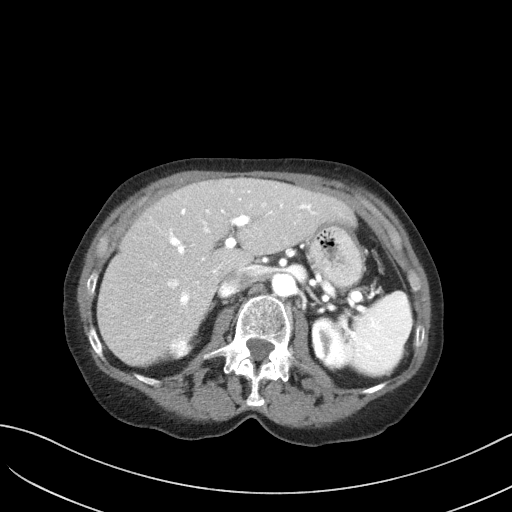
[im 67/79  soft-tissue]
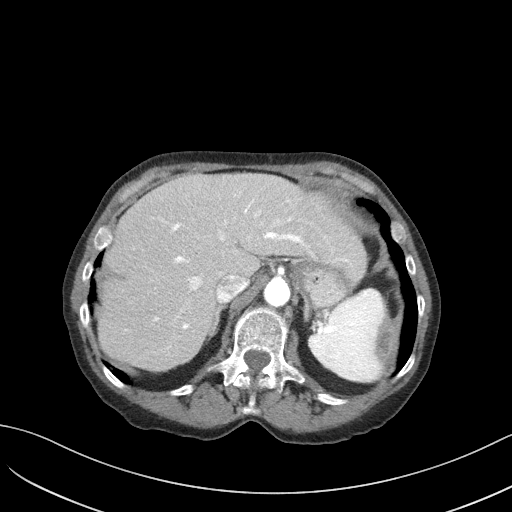
[im 73/79  soft-tissue]
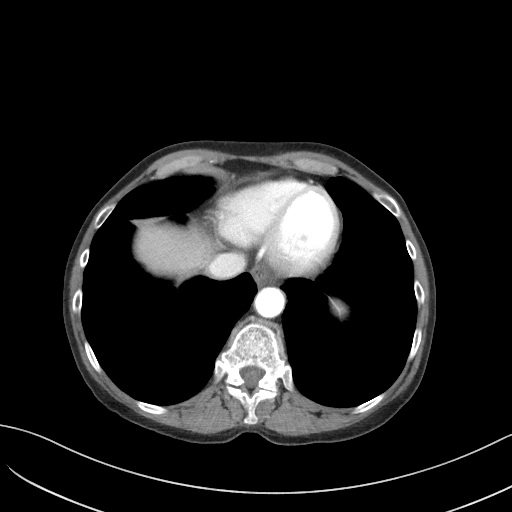

[Series 5: coronal st · coronal · 0.68mm/px · 3 of 78 slices shown]
[im 26/78  soft-tissue]
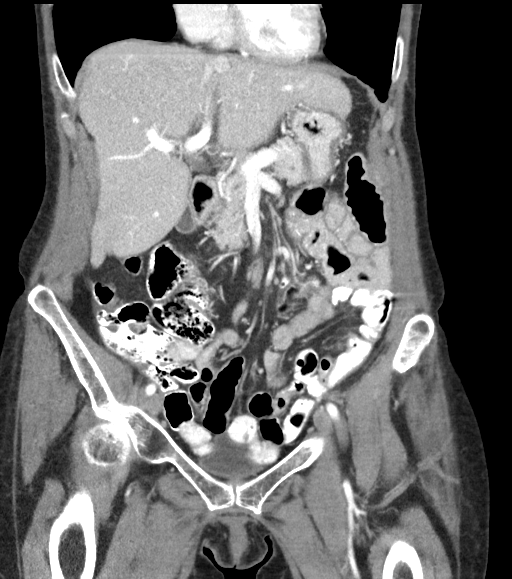
[im 35/78  soft-tissue]
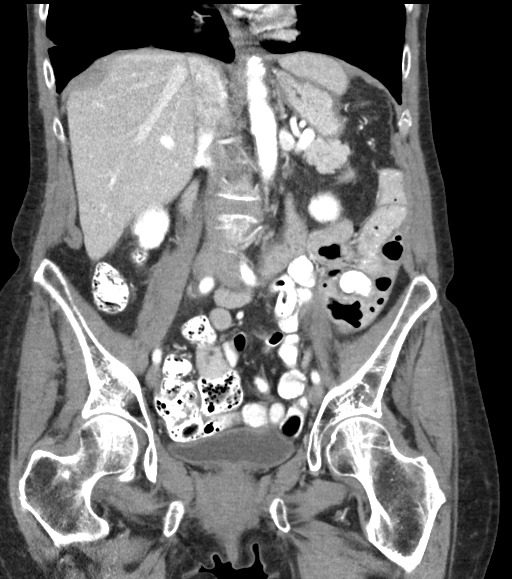
[im 43/78  soft-tissue]
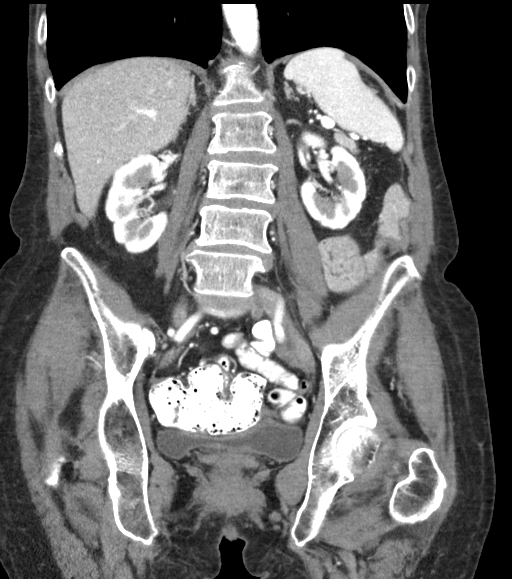

[15 of 46 positions shown; findings below may reference images not displayed]

FINDINGS: Lower chest: Severe pulmonary emphysema and subpleural reticulation.
Resolution of effusions that were seen on previous imaging.

Hepatobiliary: Low-density lesion along the anterior surface of the
liver measuring 1.5 x 0.7 cm previously 2.0 x 0.7 cm. No focal, new
hepatic lesion. The portal vein is patent. Gallbladder is normal. No
biliary duct distension. Cm

Pancreas: Pancreas is normal without ductal dilation or sign of
inflammation.

Spleen: Spleen normal in size and contour. There is a small
pericapsular area of low attenuation along the margin of the spleen
measuring 2.1 x 1.2 cm better organized than on previous imaging
studies where there was fluid density in this area measuring
approximately 8 mm greatest thickness.

Adrenals/Urinary Tract: Adrenal glands are normal.

Symmetric renal enhancement. No hydronephrosis. Urinary bladder is
normal. Renal cortical scarring bilaterally with mild striated
nephrogram, some overlying cortical scarring and areas of stray shin
on the delayed phase. No focal renal lesion

Stomach/Bowel: Post partial colonic resection with LEFT lower
quadrant colostomy. No acute small bowel process. Appendix is
normal. Colon is largely stool filled. No adjacent colonic
stranding.

Vascular/Lymphatic: Calcified and noncalcified atheromatous plaque
of the abdominal aorta. There is no gastrohepatic or hepatoduodenal
ligament lymphadenopathy. No retroperitoneal or mesenteric
lymphadenopathy.

No pelvic sidewall lymphadenopathy.

Reproductive: CT appearance of uterus and adnexa is unremarkable.
The urinary bladder is under distended limiting assessment.

Other: Ventral abdominal wall laxity and rectus diastasis showing a
similar appearance to previous imaging. No overt peritoneal
nodularity. No abdominal or pelvic ascites.

Musculoskeletal: No acute musculoskeletal process. Spinal
degenerative changes.
IMPRESSION: 1. Small area of organized fluid with some peripheral enhancement at
the site of previous postoperative fluid in the LEFT
subdiaphragmatic region adjacent to the spleen. This has diminished
in size since prior studies. Correlate with any LEFT upper quadrant
symptoms or increased white count to suggest infection. This is
nonspecific and may simply represent evolution of expected
postoperative changes. Would also suggest attention on follow-up to
exclude the possibility of peritoneal disease in this location.
2. Diminished size of hypodense area along the anterior margin of
the liver likely reflecting resolving postoperative changes.
3. Renal cortical scarring bilaterally with mild striated
nephrogram, some overlying cortical scarring and areas of stray shin
on the delayed phase. Correlate with any clinical or laboratory
evidence of pyelonephritis.
4. Severe pulmonary emphysema and subpleural reticulation.
Resolution of effusions that were seen on previous imaging.
5. Ventral abdominal wall laxity and rectus diastasis showing a
similar appearance to previous imaging.
6. Emphysema and aortic atherosclerosis.

These results will be called to the ordering clinician or
representative by the Radiologist Assistant, and communication
documented in the PACS or [REDACTED].

Aortic Atherosclerosis (4DHRX-AVE.E) and Emphysema ([id number]-1B7.Z).

## 2022-02-17 ENCOUNTER — Encounter: Payer: Self-pay | Admitting: Gastroenterology

## 2022-03-02 ENCOUNTER — Other Ambulatory Visit: Payer: Self-pay

## 2022-03-02 ENCOUNTER — Other Ambulatory Visit: Payer: Self-pay | Admitting: *Deleted

## 2022-03-02 ENCOUNTER — Inpatient Hospital Stay: Payer: Medicare HMO | Admitting: Hematology

## 2022-03-02 ENCOUNTER — Inpatient Hospital Stay: Payer: Medicare HMO

## 2022-03-02 ENCOUNTER — Inpatient Hospital Stay: Payer: Medicare HMO | Attending: Hematology

## 2022-03-02 VITALS — BP 173/90 | HR 75 | Temp 97.5°F | Resp 20 | Ht 62.0 in | Wt 119.6 lb

## 2022-03-02 DIAGNOSIS — I10 Essential (primary) hypertension: Secondary | ICD-10-CM | POA: Insufficient documentation

## 2022-03-02 DIAGNOSIS — C181 Malignant neoplasm of appendix: Secondary | ICD-10-CM | POA: Diagnosis not present

## 2022-03-02 DIAGNOSIS — Z5112 Encounter for antineoplastic immunotherapy: Secondary | ICD-10-CM | POA: Insufficient documentation

## 2022-03-02 DIAGNOSIS — J439 Emphysema, unspecified: Secondary | ICD-10-CM | POA: Insufficient documentation

## 2022-03-02 DIAGNOSIS — Z933 Colostomy status: Secondary | ICD-10-CM | POA: Diagnosis not present

## 2022-03-02 DIAGNOSIS — M8580 Other specified disorders of bone density and structure, unspecified site: Secondary | ICD-10-CM | POA: Insufficient documentation

## 2022-03-02 DIAGNOSIS — Z79899 Other long term (current) drug therapy: Secondary | ICD-10-CM | POA: Insufficient documentation

## 2022-03-02 DIAGNOSIS — Z86711 Personal history of pulmonary embolism: Secondary | ICD-10-CM | POA: Insufficient documentation

## 2022-03-02 DIAGNOSIS — Z86718 Personal history of other venous thrombosis and embolism: Secondary | ICD-10-CM | POA: Insufficient documentation

## 2022-03-02 DIAGNOSIS — C786 Secondary malignant neoplasm of retroperitoneum and peritoneum: Secondary | ICD-10-CM | POA: Diagnosis not present

## 2022-03-02 DIAGNOSIS — Z5111 Encounter for antineoplastic chemotherapy: Secondary | ICD-10-CM | POA: Diagnosis present

## 2022-03-02 DIAGNOSIS — C20 Malignant neoplasm of rectum: Secondary | ICD-10-CM

## 2022-03-02 DIAGNOSIS — R69 Illness, unspecified: Secondary | ICD-10-CM | POA: Diagnosis not present

## 2022-03-02 DIAGNOSIS — C799 Secondary malignant neoplasm of unspecified site: Secondary | ICD-10-CM

## 2022-03-02 DIAGNOSIS — F419 Anxiety disorder, unspecified: Secondary | ICD-10-CM | POA: Diagnosis not present

## 2022-03-02 DIAGNOSIS — Z7901 Long term (current) use of anticoagulants: Secondary | ICD-10-CM | POA: Diagnosis not present

## 2022-03-02 LAB — TOTAL PROTEIN, URINE DIPSTICK: Protein, ur: NEGATIVE mg/dL

## 2022-03-02 LAB — CMP (CANCER CENTER ONLY)
ALT: 8 U/L (ref 0–44)
AST: 17 U/L (ref 15–41)
Albumin: 3.6 g/dL (ref 3.5–5.0)
Alkaline Phosphatase: 118 U/L (ref 38–126)
Anion gap: 7 (ref 5–15)
BUN: 16 mg/dL (ref 8–23)
CO2: 28 mmol/L (ref 22–32)
Calcium: 9.1 mg/dL (ref 8.9–10.3)
Chloride: 104 mmol/L (ref 98–111)
Creatinine: 0.88 mg/dL (ref 0.44–1.00)
GFR, Estimated: >60 mL/min (ref >60)
Glucose, Bld: 89 mg/dL (ref 70–99)
Potassium: 3.4 mmol/L — ABNORMAL LOW (ref 3.5–5.1)
Sodium: 139 mmol/L (ref 135–145)
Total Bilirubin: 0.6 mg/dL (ref 0.3–1.2)
Total Protein: 6.5 g/dL (ref 6.5–8.1)

## 2022-03-02 LAB — CBC WITH DIFFERENTIAL (CANCER CENTER ONLY)
Abs Immature Granulocytes: 0.02 10*3/uL (ref 0.00–0.07)
Basophils Absolute: 0 10*3/uL (ref 0.0–0.1)
Basophils Relative: 1 %
Eosinophils Absolute: 0.1 10*3/uL (ref 0.0–0.5)
Eosinophils Relative: 2 %
HCT: 33.7 % — ABNORMAL LOW (ref 36.0–46.0)
Hemoglobin: 11.3 g/dL — ABNORMAL LOW (ref 12.0–15.0)
Immature Granulocytes: 0 %
Lymphocytes Relative: 23 %
Lymphs Abs: 1.1 10*3/uL (ref 0.7–4.0)
MCH: 35.9 pg — ABNORMAL HIGH (ref 26.0–34.0)
MCHC: 33.5 g/dL (ref 30.0–36.0)
MCV: 107 fL — ABNORMAL HIGH (ref 80.0–100.0)
Monocytes Absolute: 0.7 10*3/uL (ref 0.1–1.0)
Monocytes Relative: 13 %
Neutro Abs: 3 10*3/uL (ref 1.7–7.7)
Neutrophils Relative %: 61 %
Platelet Count: 246 10*3/uL (ref 150–400)
RBC: 3.15 MIL/uL — ABNORMAL LOW (ref 3.87–5.11)
RDW: 14.9 % (ref 11.5–15.5)
WBC Count: 5 10*3/uL (ref 4.0–10.5)
nRBC: 0 % (ref 0.0–0.2)

## 2022-03-02 LAB — CEA (IN HOUSE-CHCC): CEA (CHCC-In House): 7.86 ng/mL — ABNORMAL HIGH (ref 0.00–5.00)

## 2022-03-02 MED ORDER — SODIUM CHLORIDE 0.9% FLUSH
10.0000 mL | Freq: Once | INTRAVENOUS | Status: AC
  Administered 2022-03-02: 10 mL

## 2022-03-02 MED ORDER — CAPECITABINE 500 MG PO TABS: 1000.0000 mg/m2 | ORAL_TABLET | Freq: Two times a day (BID) | ORAL | 1 refills | Status: DC

## 2022-03-02 MED ORDER — SODIUM CHLORIDE 0.9% FLUSH
10.0000 mL | INTRAVENOUS | Status: DC | PRN
  Administered 2022-03-02: 10 mL

## 2022-03-02 MED ORDER — LOSARTAN POTASSIUM 25 MG PO TABS: 25.0000 mg | ORAL_TABLET | Freq: Every day | ORAL | 1 refills | Status: DC

## 2022-03-02 MED ORDER — SODIUM CHLORIDE 0.9 % IV SOLN
400.0000 mg | Freq: Once | INTRAVENOUS | Status: AC
  Administered 2022-03-02: 400 mg via INTRAVENOUS
  Filled 2022-03-02: qty 16

## 2022-03-02 MED ORDER — SODIUM CHLORIDE 0.9 % IV SOLN: Freq: Once | INTRAVENOUS | Status: AC

## 2022-03-02 MED ORDER — PALONOSETRON HCL INJECTION 0.25 MG/5ML
0.2500 mg | Freq: Once | INTRAVENOUS | Status: AC
  Administered 2022-03-02: 0.25 mg via INTRAVENOUS
  Filled 2022-03-02: qty 5

## 2022-03-02 MED ORDER — SODIUM CHLORIDE 0.9 % IV SOLN
10.0000 mg | Freq: Once | INTRAVENOUS | Status: AC
  Administered 2022-03-02: 10 mg via INTRAVENOUS
  Filled 2022-03-02: qty 10

## 2022-03-02 MED ORDER — DEXTROSE 5 % IV SOLN: Freq: Once | INTRAVENOUS | Status: AC

## 2022-03-02 MED ORDER — SODIUM CHLORIDE 0.9% FLUSH: 10.0000 mL | INTRAVENOUS | Status: DC | PRN

## 2022-03-02 MED ORDER — HEPARIN SOD (PORK) LOCK FLUSH 100 UNIT/ML IV SOLN
500.0000 [IU] | Freq: Once | INTRAVENOUS | Status: AC | PRN
  Administered 2022-03-02: 500 [IU]

## 2022-03-02 MED ORDER — LIDOCAINE-PRILOCAINE 2.5-2.5 % EX CREA: 1.0000 "application " | TOPICAL_CREAM | CUTANEOUS | 2 refills | Status: DC | PRN

## 2022-03-02 MED ORDER — OXALIPLATIN CHEMO INJECTION 100 MG/20ML
50.0000 mg/m2 | Freq: Once | INTRAVENOUS | Status: AC
  Administered 2022-03-02: 80 mg via INTRAVENOUS
  Filled 2022-03-02: qty 16

## 2022-03-02 NOTE — Progress Notes (Signed)
?[name]   ?[phone] [phone]   ?Clinic Follow up Note  ? ?Patient Care Team: ?[name], DO as PCP - General (Family Medicine) ?[name], MD as Consulting Physician (Hematology) ?[name], DO as Consulting Physician (Pulmonary Disease) ?[facility] [employer] [facility] ?[name], MD as Consulting Physician (General Surgery) ?[name], MD as Consulting Physician (General Surgery) ?[name], [name], MD as Consulting Physician (Gastroenterology) ? ?Date of Service:  03/02/2022 ? ?CHIEF COMPLAINT: f/u of colon cancer ? ?CURRENT THERAPY:  ?CAPOX ?-Xeloda started 02/01/21, currently 1500 mg BID 7 days on/7 days off ?-Bevacizumab q2weeks added with C2 02/24/21 ?            -Beva held 09/02/21-12/28/21 d/t PE ?-Oxaliplatin added with C4 03/24/21, currently q3weeks ? ?ASSESSMENT & PLAN:  ?Tracy [name] is a 71 y.o. female with  ? ?1. [name], MMR normal, peritoneal metastasis in 01/2021 ?-diagnosed 02/2020, due to perforation she underwent emergent surgery. Path showed invasive adenocarcinoma with perforation, +3/15 LNs, and clear margins. Initial CT scan negative for distant metastasis. ?-she was not a candidate for adjuvant chemo due to very slow recovery from surgery ?-Guardant Reveal ctDNA were negative (06/06/20, 07/12/20, and 09/10/20). ?-surveillance CT 08/27/20 showed a small density adjacent to the spleen that had decreased, no other evidence of recurrent or metastatic disease ?-first surveillance colonoscopy by Dr. Havery Moros 01/06/21 showed new adenocarcinoma at appendiceal orifice and other tubular adenomas throughout the colon.  Biopsy confirmed adenocarcinoma ?-01/27/21 PET showed tumor in appendiceal orifice is likely peritoneal metastasis growing into the cecum.  Her peritoneal implants are mainly in the pelvis, difficult to biopsy ?-She began single agent Xeloda with cycle 1 on 02/01/21.  Due to low  performance status with cycle 2 was changed to 1 week on/1 week off, and beva was added. She tolerates this well ?-PS improved and low dose oxaliplatin was added with C4, now on Capeox/beva q2 weeks, with Xeloda 1 week on/1 week off. ?-she is responding well. She is now receiving oxali q3 weeks. 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  ?-Beva was held for PE found on CT 08/2021, she is on xarelto. Resumed beva q3 weeks 12/29/21 ?-labs reviewed, overall stable and adequate to proceed with treatment. Her potassium is a bit low; I advised her to increase potassium intake with banana or orange. Will plan for restaging scan after this cycle. ? ?2. HTN ?-secondary to Beva ?-she has an intolerance listed for amlodipine. I will call in losartan 52m daily for her ?- I advised her to get a BP cuff to monitor at home. ?  ?3. Bilateral PE, found on restaging CT CAP 08/14/21  ?-no hypoxia, stable respiratory status. She began xarelto 08/15/21 ?-Beva was on hold, CT 12/25/21 was clear, Beva resumed 12/29/21 ?  ?4. COVID-19 + 05/21/21 and respiratory infection ?-she received CAPOX/beva on 05/19/21, tested positive on 05/21/21 ?-treated with paxlovid and subsequently doxycycline, augment, and prednisone. Recovered well. ?  ?5. Comorbidities: Asthma, COPD, anxiety ?-on nebulizer and trelegy, has not needed rescue inhaler recently ?-f/up pulmonology and PCP ?  ?  ?PLAN: ?-proceed with oxali/Beva today ?-lab, flush, f/u, and oxali/beva in 3 and 6 weeks ?-restaging CT to be done before chemo in 6 weeks ? ? ?No problem-specific Assessment & Plan notes found for this encounter. ? ? ?SUMMARY OF ONCOLOGIC HISTORY: ?Oncology History Overview Note  ?Cancer Staging ?Rectal cancer ([facility] ?Staging form: Colon  and Rectum, AJCC 8th Edition ?- Pathologic stage from 03/03/2020: Stage IIIB (pT4a, pN1b, cM0) - Signed by [name], MD on 01/28/2021 ?Stage prefix: Initial diagnosis ?Histologic grading system: 4 grade system ?Histologic  grade (G): G2 ?Residual tumor (R): R0 - None ? ?  ?Rectal cancer ([place])  ?03/03/2020 - 04/05/2020 Hospital Admission  ? She was admitted to ED on 03/03/20 for abdominal pain and nausea. She had been having diarrhea for several weeks. During hospital stay she developed acute respiratory failure, AKI, RUE DVT from PICC line. Work up showed bowel perforation, small left liver mass and thickening of jejunum. She underwent emergent surgery on 03/03/20 for resection and colostomy placement. Her path showed invasive cancer, metastatic to 3/15 LNs. She had a NGT in placed but this was removed on POD 3. Post op her stoma became necrotic and septic. She had another bowel surgery on 03/12/20, which was NED with necrotic tissue.  ?  ?03/03/2020 Imaging  ? CT AP 03/03/20  ?IMPRESSION: ?1. Free intraperitoneal air consistent perforation. Most likely site ?of perforation is in the distal splenic flexure/proximal descending ?colon where there is a collection of air and gas measuring 6 ?centimeters. No obvious soft tissue mass identified in this region. ?At this site, there is abrupt transition of dilated, stool-filled ?colon to completely decompressed proximal descending colon. ?2. Thickened, inflamed loops of jejunum are identified within the ?pelvis and are likely reactive. ?3. Small hiatal hernia. ?4. Benign-appearing 1.6 centimeter mass the LEFT hepatic lobe. ?Recommend comparison with prior studies if available. ?5.  Emphysema (ICD10-J43.9). ?6.  Aortic Atherosclerosis (ICD10-I70.0). ?7. Bilateral renal scarring. ?  ?03/03/2020 Surgery  ? low anterior resection end colostomy by Dr Marcello Moores  ?  ?03/03/2020 Initial Biopsy  ? FINAL MICROSCOPIC DIAGNOSIS: 03/03/20 ?A. RECTOSIGMOID COLON, LOW ANTERIOR RESECTION:  ?- Invasive colonic adenocarcinoma, 3.5 cm.  ?- Tumor invades the visceral peritoneum.  ?- Margins of resection are not involved.  ?- Metastatic carcinoma in (3) of (15) lymph nodes.  ?- See oncology table.  ? ?B. ADDITIONAL SIGMOID  COLON, RESECTION:  ?- Colonic tissue, negative for carcinoma. ? ?ADDENDUM:  ?Mismatch Repair Protein (IHC)  ? ?SUMMARY INTERPRETATION: NORMAL  ?There is preserved expression of the major MMR proteins. There is a very  ?low probability that microsatellite instability (MSI) is present.  ?However, certain clinically significant MMR protein mutations may result  ?in preservation of nuclear expression. It is recommended that the  ?preservation of protein expression be correlated with molecular based  ?MSI testing.  ? ?IHC EXPRESSION RESULTS  ?TEST           RESULT  ?MLH1:          Preserved nuclear expression  ?MSH2:          Preserved nuclear expression  ?MSH6:          Preserved nuclear expression  ?PMS2:          Preserved nuclear expression ?  ?03/03/2020 Cancer Staging  ? Staging form: Colon and Rectum, AJCC 8th Edition ?- Pathologic stage from 03/03/2020: Stage IIIB (pT4a, pN1b, cM0) - Signed by [name], MD on 01/28/2021 ?Stage prefix: Initial diagnosis ?Histologic grading system: 4 grade system ?Histologic grade (G): G2 ?Residual tumor (R): R0 - None ? ?  ?03/08/2020 Imaging  ? CT AP 03/08/20 ?IMPRESSION: ?1. Interval midline laparotomy with distal colon resection and ?diverting left lower quadrant colostomy. ?2. Diffuse small bowel dilatation with gas fluid levels most ?consistent with postoperative ileus. ?3. Trace ascites within the  abdomen and pelvis. No fluid collection ?or abscess at this time. Surgical drain within the lower pelvis. ?4. Indeterminate 1.4 cm subcapsular liver hypodensity. In light of ?newly diagnosed rectal cancer, metastatic disease cannot be ?excluded. PET CT may be useful for further evaluation. ?5. Interval development of trace bilateral pleural effusions and ?diffuse body wall edema. ?  ?03/12/2020 Surgery  ? EXPLORATORY LAPAROTOMY WITH BOWEL RESECTION AND COLOSTOMY by Dr Hassell Done 03/12/20 ? ?FINAL MICROSCOPIC DIAGNOSIS: 03/12/20 ? ?A. COLON, SPLENIC FLEXURE, RESECTION:  ?- Segment of colon (37  cm) with perforation and associated inflammation  ?- Multiple mucosal ulcers with necrotizing inflammation  ?- No evidence of malignancy  ?  ?03/12/2020 Imaging  ? CT AP WO contrast 03/12/20  ?IMPRESSION: ?1

## 2022-03-02 NOTE — Patient Instructions (Signed)
Tracy [name]  Discharge Instructions: ?Thank you for choosing [facility] to provide your oncology and hematology care.  ? ?If you have a lab appointment with the [facility], please go directly to the [facility] and check in at the registration area. ?  ?Wear comfortable clothing and clothing appropriate for easy access to any Portacath or PICC line.  ? ?We strive to give you quality time with your provider. You may need to reschedule your appointment if you arrive late (15 or more minutes).  Arriving late affects you and other patients whose appointments are after yours.  Also, if you miss three or more appointments without notifying the office, you may be dismissed from the clinic at the provider?s discretion.    ?  ?For prescription refill requests, have your pharmacy contact our office and allow 72 hours for refills to be completed.   ? ?Today you received the following chemotherapy and/or immunotherapy agents Zirabev & Oxaliplatin    ?  ?To help prevent nausea and vomiting after your treatment, we encourage you to take your nausea medication as directed. ? ?BELOW ARE SYMPTOMS THAT SHOULD BE REPORTED IMMEDIATELY: ?*FEVER GREATER THAN 100.4 F (38 ?C) OR HIGHER ?*CHILLS OR SWEATING ?*NAUSEA AND VOMITING THAT IS NOT CONTROLLED WITH YOUR NAUSEA MEDICATION ?*UNUSUAL SHORTNESS OF BREATH ?*UNUSUAL BRUISING OR BLEEDING ?*URINARY PROBLEMS (pain or burning when urinating, or frequent urination) ?*BOWEL PROBLEMS (unusual diarrhea, constipation, pain near the anus) ?TENDERNESS IN MOUTH AND THROAT WITH OR WITHOUT PRESENCE OF ULCERS (sore throat, sores in mouth, or a toothache) ?UNUSUAL RASH, SWELLING OR PAIN  ?UNUSUAL VAGINAL DISCHARGE OR ITCHING  ? ?Items with * indicate a potential emergency and should be followed up as soon as possible or go to the Emergency Department if any problems should occur. ? ?Please show the CHEMOTHERAPY ALERT CARD or IMMUNOTHERAPY ALERT CARD at  check-in to the Emergency Department and triage nurse. ? ?Should you have questions after your visit or need to cancel or reschedule your appointment, please contact [facility]  Dept: [phone]  and follow the prompts.  Office hours are 8:00 a.m. to 4:30 p.m. Monday - Friday. Please note that voicemails left after 4:00 p.m. may not be returned until the following business day.  We are closed weekends and major holidays. You have access to a nurse at all times for urgent questions. Please call the main number to the clinic Dept: [phone] and follow the prompts. ? ? ?For any non-urgent questions, you may also contact your provider using MyChart. We now offer e-Visits for anyone 94 and older to request care online for non-urgent symptoms. For details visit [email or web address]. ?  ?Also download the MyChart app! Go to the app store, search "MyChart", open the app, select [facility], and log in with your MyChart username and password. ? ?Due to Covid, a mask is required upon entering the hospital/clinic. If you do not have a mask, one will be given to you upon arrival. For doctor visits, patients may have 1 support person aged 68 or older with them. For treatment visits, patients cannot have anyone with them due to current Covid guidelines and our immunocompromised population.  ? ?

## 2022-03-02 NOTE — Progress Notes (Signed)
Urine protein for Bevacizumab tx ordered ?

## 2022-03-03 ENCOUNTER — Other Ambulatory Visit: Payer: Self-pay | Admitting: Hematology

## 2022-03-04 ENCOUNTER — Telehealth: Payer: Self-pay | Admitting: Hematology

## 2022-03-04 NOTE — Telephone Encounter (Signed)
Scheduled follow-up appointments per 3/20 los. Patient is aware. ?

## 2022-03-10 ENCOUNTER — Other Ambulatory Visit: Payer: Self-pay | Admitting: Hematology

## 2022-03-17 DIAGNOSIS — Z933 Colostomy status: Secondary | ICD-10-CM | POA: Diagnosis not present

## 2022-03-18 ENCOUNTER — Telehealth: Payer: Self-pay | Admitting: Pulmonary Disease

## 2022-03-18 NOTE — Telephone Encounter (Signed)
Called patient but she did not answer. Left message for her to call us back.  

## 2022-03-19 MED ORDER — PREDNISONE 10 MG PO TABS: ORAL_TABLET | ORAL | 0 refills | Status: DC

## 2022-03-19 MED ORDER — DOXYCYCLINE HYCLATE 100 MG PO TABS: 100.0000 mg | ORAL_TABLET | Freq: Two times a day (BID) | ORAL | 0 refills | Status: DC

## 2022-03-19 NOTE — Telephone Encounter (Signed)
Patient is returning phone call. Patient phone number is [phone]. ?

## 2022-03-19 NOTE — Progress Notes (Signed)
[facility] ?OFFICE PROGRESS NOTE ? ?Kuneff, Renee A, DO ?[address] ?[place] [address] ? ?DIAGNOSIS: f/u of colon cancer ?Oncology History Overview Note  ?Cancer Staging ?Rectal cancer ([place]) ?Staging form: Colon and Rectum, AJCC 8th Edition ?- Pathologic stage from 03/03/2020: Stage IIIB (pT4a, pN1b, cM0) - Signed by [name], MD on 01/28/2021 ?Stage prefix: Initial diagnosis ?Histologic grading system: 4 grade system ?Histologic grade (G): G2 ?Residual tumor (R): R0 - None ? ?  ?Rectal cancer ([place])  ?03/03/2020 - 04/05/2020 Hospital Admission  ? She was admitted to ED on 03/03/20 for abdominal pain and nausea. She had been having diarrhea for several weeks. During hospital stay she developed acute respiratory failure, AKI, RUE DVT from PICC line. Work up showed bowel perforation, small left liver mass and thickening of jejunum. She underwent emergent surgery on 03/03/20 for resection and colostomy placement. Her path showed invasive cancer, metastatic to 3/15 LNs. She had a NGT in placed but this was removed on POD 3. Post op her stoma became necrotic and septic. She had another bowel surgery on 03/12/20, which was NED with necrotic tissue.  ?  ?03/03/2020 Imaging  ? CT AP 03/03/20  ?IMPRESSION: ?1. Free intraperitoneal air consistent perforation. Most likely site ?of perforation is in the distal splenic flexure/proximal descending ?colon where there is a collection of air and gas measuring 6 ?centimeters. No obvious soft tissue mass identified in this region. ?At this site, there is abrupt transition of dilated, stool-filled ?colon to completely decompressed proximal descending colon. ?2. Thickened, inflamed loops of jejunum are identified within the ?pelvis and are likely reactive. ?3. Small hiatal hernia. ?4. Benign-appearing 1.6 centimeter mass the LEFT hepatic lobe. ?Recommend comparison with prior studies if available. ?5.  Emphysema (ICD10-J43.9). ?6.  Aortic Atherosclerosis (ICD10-I70.0). ?7.  Bilateral renal scarring. ?  ?03/03/2020 Surgery  ? low anterior resection end colostomy by Dr Marcello Moores  ?  ?03/03/2020 Initial Biopsy  ? FINAL MICROSCOPIC DIAGNOSIS: 03/03/20 ?A. RECTOSIGMOID COLON, LOW ANTERIOR RESECTION:  ?- Invasive colonic adenocarcinoma, 3.5 cm.  ?- Tumor invades the visceral peritoneum.  ?- Margins of resection are not involved.  ?- Metastatic carcinoma in (3) of (15) lymph nodes.  ?- See oncology table.  ? ?B. ADDITIONAL SIGMOID COLON, RESECTION:  ?- Colonic tissue, negative for carcinoma. ? ?ADDENDUM:  ?Mismatch Repair Protein (IHC)  ? ?SUMMARY INTERPRETATION: NORMAL  ?There is preserved expression of the major MMR proteins. There is a very  ?low probability that microsatellite instability (MSI) is present.  ?However, certain clinically significant MMR protein mutations may result  ?in preservation of nuclear expression. It is recommended that the  ?preservation of protein expression be correlated with molecular based  ?MSI testing.  ? ?IHC EXPRESSION RESULTS  ?TEST           RESULT  ?MLH1:          Preserved nuclear expression  ?MSH2:          Preserved nuclear expression  ?MSH6:          Preserved nuclear expression  ?PMS2:          Preserved nuclear expression ?  ?03/03/2020 Cancer Staging  ? Staging form: Colon and Rectum, AJCC 8th Edition ?- Pathologic stage from 03/03/2020: Stage IIIB (pT4a, pN1b, cM0) - Signed by [name], MD on 01/28/2021 ?Stage prefix: Initial diagnosis ?Histologic grading system: 4 grade system ?Histologic grade (G): G2 ?Residual tumor (R): R0 - None ? ?  ?03/08/2020 Imaging  ?  CT AP 03/08/20 ?IMPRESSION: ?1. Interval midline laparotomy with distal colon resection and ?diverting left lower quadrant colostomy. ?2. Diffuse small bowel dilatation with gas fluid levels most ?consistent with postoperative ileus. ?3. Trace ascites within the abdomen and pelvis. No fluid collection ?or abscess at this time. Surgical drain within the lower pelvis. ?4. Indeterminate 1.4 cm  subcapsular liver hypodensity. In light of ?newly diagnosed rectal cancer, metastatic disease cannot be ?excluded. PET CT may be useful for further evaluation. ?5. Interval development of trace bilateral pleural effusions and ?diffuse body wall edema. ?  ?03/12/2020 Surgery  ? EXPLORATORY LAPAROTOMY WITH BOWEL RESECTION AND COLOSTOMY by Dr Hassell Done 03/12/20 ? ?FINAL MICROSCOPIC DIAGNOSIS: 03/12/20 ? ?A. COLON, SPLENIC FLEXURE, RESECTION:  ?- Segment of colon (37 cm) with perforation and associated inflammation  ?- Multiple mucosal ulcers with necrotizing inflammation  ?- No evidence of malignancy  ?  ?03/12/2020 Imaging  ? CT AP WO contrast 03/12/20  ?IMPRESSION: ?1. Exam is limited by lack of intravenous contrast, motion, and ?artifact from the patient's arms adjacent to the torso. Since ?03/08/2020, there has been development of relatively large pockets ?of intraperitoneal free air. While intraperitoneal gas would not be ?unexpected on postoperative day 9, this gas is new since an ?intervening study of 03/08/2020 and given the relatively large ?volume certainly raises concern for bowel perforation. No source for ?the intraperitoneal free air is evident on this study. There is a ?surgical drain in the pelvis, but there is no free gas around the ?drain itself to suggest that it represents the source. ?2. New circumferential wall thickening in the splenic flexure, ?descending colon and sigmoid colon leading into the end colostomy. ?Infection/inflammation would be a consideration. Ischemia cannot be ?excluded. ?3. Relatively small volume intraperitoneal free fluid. High ?attenuation small fluid collections in the left upper abdomen may ?reflect hemorrhage, infection or residua from prior perforation. ?4. Interval progression of diffuse body wall edema. ?5. Residual contrast material in the renal parenchyma from prior ?imaging, compatible with renal dysfunction. ? ?  ?03/19/2020 Imaging  ? CT CAP 03/19/20  ?IMPRESSION: ?1. 5.5 x  3.2 cm fluid collection is noted along the greater ?curvature of the proximal stomach. ?2. Surgical drain is again noted in the pelvis with tip in left ?lower quadrant. ?3. Interval development of crescent-shaped fluid collection ?measuring 16.4 x 3.3 cm in the epigastric region and left upper ?quadrant of the abdomen which may extend into the left lower ?quadrant. Potentially this may represent abscess or developing ?abscess. Multiple other smaller fluid collections are noted which ?may represent small abscesses. ?4. 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 ?5. Mild amount of free fluid is noted in the posterior pelvis. ?6. Moderate anasarca is noted. ?Aortic Atherosclerosis (ICD10-I70.0). ?  ?04/12/2020 Imaging  ? CT AP 04/12/20  ?IMPRESSION: ?1. Fluid collections within the LEFT abdomen have significantly ?decreased compared to previous CT exams, now nearly completely ?resolved. ?2. Percutaneous drainage catheter with tip coiled posterior to the ?LEFT kidney, stable positioning compared to the previous study. The ?more anterior catheter has been removed. ?3. Small amount of free fluid persists within the abdomen and ?pelvis. ?4. Trace bibasilar pleural effusions. ?5. Anasarca. ?6. While reviewing today's study, comparing with a ?chest/abdomen/pelvis CT from earlier same month, there is question ?of thrombus in the LEFT internal jugular vein. Recommend ultrasound ?of the LEFT IJ to exclude DVT. This recommendation discussed with ?patient's hospitalist, Dr. Owens Shark, on 04/12/2020 at 4:20 p.m. ?  ?Emphysema (ICD10-J43.9). ?  ?04/22/2020 Imaging  ?  CT AP 04/22/20  ?IMPRESSION: ?1. No recurrent intra-abdominal abscess status post interval removal ?of left retroperitoneal percutaneous drain. Stable small amount of ?free pelvic fluid. ?2. Enlarging dependent bilateral pleural effusions, now moderate in ?volume. Associated atelectasis at both lung bases. ?3. Progressive anasarca with generalized edema throughout  the ?subcutaneous fat. ?4. Stable small subcapsular fluid collection along the anterior ?aspect of the left hepatic lobe. ?5. Aortic Atherosclerosis (ICD10-I70.0). ?  ?05/23/2020 Initial Diagnosis  ? Rectal cancer ([facility])

## 2022-03-19 NOTE — Telephone Encounter (Signed)
Spoke with the pt  ?She is requesting rx for prednisone and doxy  ?She says having increased SOB, wheezing, cough with green sputum x 1 wk  ?She feels her allergies have caused her to develop a sinus infection  ?She has had some PND  ?No fevers, aches, night sweats ?She takes trelegy, benadryl, flonase, albuterol  ?I offered appt but she refused, just wants rxs sent since this is her usual flare in the spring  ?Please advise, thanks! ? ?Allergies  ?Allergen Reactions  ? Amlodipine   ?  Leg swelling.   ? Codeine Swelling  ? ? ?

## 2022-03-19 NOTE — Telephone Encounter (Signed)
[name], DO ?to Me  [facility] Triage Pool   ?   4:49 PM ? Doxy X 7 days  ?Pred taper '40mg'$  X 4 days, drop by 10 every 4 days  ? ?[name], DO  ?[facility]  ?03/19/2022 4:49 PM  ? ?I called and spoke with the pt and notified of response per Dr Valeta Harms. She verbalized understanding and rxs were sent to pharm ?

## 2022-03-23 ENCOUNTER — Inpatient Hospital Stay: Payer: Medicare HMO

## 2022-03-23 ENCOUNTER — Inpatient Hospital Stay: Payer: Medicare HMO | Attending: Hematology

## 2022-03-23 ENCOUNTER — Inpatient Hospital Stay: Payer: Medicare HMO | Admitting: Physician Assistant

## 2022-03-23 ENCOUNTER — Other Ambulatory Visit: Payer: Self-pay

## 2022-03-23 VITALS — BP 159/79 | HR 100 | Temp 97.6°F | Resp 20 | Ht 62.0 in | Wt 116.4 lb

## 2022-03-23 DIAGNOSIS — Z79899 Other long term (current) drug therapy: Secondary | ICD-10-CM | POA: Insufficient documentation

## 2022-03-23 DIAGNOSIS — C799 Secondary malignant neoplasm of unspecified site: Secondary | ICD-10-CM

## 2022-03-23 DIAGNOSIS — Z5112 Encounter for antineoplastic immunotherapy: Secondary | ICD-10-CM | POA: Diagnosis present

## 2022-03-23 DIAGNOSIS — C20 Malignant neoplasm of rectum: Secondary | ICD-10-CM | POA: Diagnosis present

## 2022-03-23 DIAGNOSIS — I10 Essential (primary) hypertension: Secondary | ICD-10-CM | POA: Diagnosis not present

## 2022-03-23 DIAGNOSIS — Z7901 Long term (current) use of anticoagulants: Secondary | ICD-10-CM | POA: Diagnosis not present

## 2022-03-23 DIAGNOSIS — F419 Anxiety disorder, unspecified: Secondary | ICD-10-CM | POA: Diagnosis not present

## 2022-03-23 DIAGNOSIS — Z86711 Personal history of pulmonary embolism: Secondary | ICD-10-CM | POA: Diagnosis not present

## 2022-03-23 DIAGNOSIS — Z7952 Long term (current) use of systemic steroids: Secondary | ICD-10-CM | POA: Insufficient documentation

## 2022-03-23 DIAGNOSIS — J449 Chronic obstructive pulmonary disease, unspecified: Secondary | ICD-10-CM | POA: Insufficient documentation

## 2022-03-23 DIAGNOSIS — Z933 Colostomy status: Secondary | ICD-10-CM | POA: Diagnosis not present

## 2022-03-23 DIAGNOSIS — Z5111 Encounter for antineoplastic chemotherapy: Secondary | ICD-10-CM | POA: Diagnosis present

## 2022-03-23 DIAGNOSIS — C786 Secondary malignant neoplasm of retroperitoneum and peritoneum: Secondary | ICD-10-CM | POA: Insufficient documentation

## 2022-03-23 DIAGNOSIS — R69 Illness, unspecified: Secondary | ICD-10-CM | POA: Diagnosis not present

## 2022-03-23 LAB — CMP (CANCER CENTER ONLY)
ALT: 11 U/L (ref 0–44)
AST: 21 U/L (ref 15–41)
Albumin: 4.1 g/dL (ref 3.5–5.0)
Alkaline Phosphatase: 120 U/L (ref 38–126)
Anion gap: 10 (ref 5–15)
BUN: 21 mg/dL (ref 8–23)
CO2: 24 mmol/L (ref 22–32)
Calcium: 9.6 mg/dL (ref 8.9–10.3)
Chloride: 101 mmol/L (ref 98–111)
Creatinine: 1.05 mg/dL — ABNORMAL HIGH (ref 0.44–1.00)
GFR, Estimated: 58 mL/min — ABNORMAL LOW (ref >60)
Glucose, Bld: 133 mg/dL — ABNORMAL HIGH (ref 70–99)
Potassium: 3.6 mmol/L (ref 3.5–5.1)
Sodium: 135 mmol/L (ref 135–145)
Total Bilirubin: 0.7 mg/dL (ref 0.3–1.2)
Total Protein: 7.3 g/dL (ref 6.5–8.1)

## 2022-03-23 LAB — CBC WITH DIFFERENTIAL (CANCER CENTER ONLY)
Abs Immature Granulocytes: 0.01 10*3/uL (ref 0.00–0.07)
Basophils Absolute: 0 10*3/uL (ref 0.0–0.1)
Basophils Relative: 0 %
Eosinophils Absolute: 0 10*3/uL (ref 0.0–0.5)
Eosinophils Relative: 0 %
HCT: 36.1 % (ref 36.0–46.0)
Hemoglobin: 12.5 g/dL (ref 12.0–15.0)
Immature Granulocytes: 0 %
Lymphocytes Relative: 15 %
Lymphs Abs: 0.6 10*3/uL — ABNORMAL LOW (ref 0.7–4.0)
MCH: 36.2 pg — ABNORMAL HIGH (ref 26.0–34.0)
MCHC: 34.6 g/dL (ref 30.0–36.0)
MCV: 104.6 fL — ABNORMAL HIGH (ref 80.0–100.0)
Monocytes Absolute: 0.1 10*3/uL (ref 0.1–1.0)
Monocytes Relative: 3 %
Neutro Abs: 3.5 10*3/uL (ref 1.7–7.7)
Neutrophils Relative %: 82 %
Platelet Count: 268 10*3/uL (ref 150–400)
RBC: 3.45 MIL/uL — ABNORMAL LOW (ref 3.87–5.11)
RDW: 13.7 % (ref 11.5–15.5)
WBC Count: 4.2 10*3/uL (ref 4.0–10.5)
nRBC: 0 % (ref 0.0–0.2)

## 2022-03-23 LAB — CEA (IN HOUSE-CHCC): CEA (CHCC-In House): 8.48 ng/mL — ABNORMAL HIGH (ref 0.00–5.00)

## 2022-03-23 LAB — TOTAL PROTEIN, URINE DIPSTICK: Protein, ur: 30 mg/dL — AB

## 2022-03-23 MED ORDER — LIDOCAINE-PRILOCAINE 2.5-2.5 % EX CREA: 1.0000 "application " | TOPICAL_CREAM | CUTANEOUS | 2 refills | Status: DC | PRN

## 2022-03-23 MED ORDER — DEXTROSE 5 % IV SOLN: Freq: Once | INTRAVENOUS | Status: AC

## 2022-03-23 MED ORDER — PALONOSETRON HCL INJECTION 0.25 MG/5ML
0.2500 mg | Freq: Once | INTRAVENOUS | Status: AC
  Administered 2022-03-23: 0.25 mg via INTRAVENOUS
  Filled 2022-03-23: qty 5

## 2022-03-23 MED ORDER — OXALIPLATIN CHEMO INJECTION 100 MG/20ML
50.0000 mg/m2 | Freq: Once | INTRAVENOUS | Status: AC
  Administered 2022-03-23: 80 mg via INTRAVENOUS
  Filled 2022-03-23: qty 16

## 2022-03-23 MED ORDER — SODIUM CHLORIDE 0.9% FLUSH
10.0000 mL | INTRAVENOUS | Status: DC | PRN
  Administered 2022-03-23: 10 mL

## 2022-03-23 MED ORDER — SODIUM CHLORIDE 0.9 % IV SOLN
10.0000 mg | Freq: Once | INTRAVENOUS | Status: AC
  Administered 2022-03-23: 10 mg via INTRAVENOUS
  Filled 2022-03-23: qty 10

## 2022-03-23 MED ORDER — SODIUM CHLORIDE 0.9 % IV SOLN
400.0000 mg | Freq: Once | INTRAVENOUS | Status: AC
  Administered 2022-03-23: 400 mg via INTRAVENOUS
  Filled 2022-03-23: qty 16

## 2022-03-23 MED ORDER — SODIUM CHLORIDE 0.9% FLUSH
10.0000 mL | Freq: Once | INTRAVENOUS | Status: AC
  Administered 2022-03-23: 10 mL

## 2022-03-23 MED ORDER — SODIUM CHLORIDE 0.9 % IV SOLN: Freq: Once | INTRAVENOUS | Status: AC

## 2022-03-23 MED ORDER — HEPARIN SOD (PORK) LOCK FLUSH 100 UNIT/ML IV SOLN
500.0000 [IU] | Freq: Once | INTRAVENOUS | Status: AC | PRN
  Administered 2022-03-23: 500 [IU]

## 2022-03-23 NOTE — Patient Instructions (Signed)
[facility]  Discharge Instructions: ?Thank you for choosing [facility] to provide your oncology and hematology care.  ? ?If you have a lab appointment with the [facility], please go directly to the [facility] and check in at the registration area. ?  ?Wear comfortable clothing and clothing appropriate for easy access to any Portacath or PICC line.  ? ?We strive to give you quality time with your provider. You may need to reschedule your appointment if you arrive late (15 or more minutes).  Arriving late affects you and other patients whose appointments are after yours.  Also, if you miss three or more appointments without notifying the office, you may be dismissed from the clinic at the provider?s discretion.    ?  ?For prescription refill requests, have your pharmacy contact our office and allow 72 hours for refills to be completed.   ? ?Today you received the following chemotherapy and/or immunotherapy agents Zirabev & Oxaliplatin    ?  ?To help prevent nausea and vomiting after your treatment, we encourage you to take your nausea medication as directed. ? ?BELOW ARE SYMPTOMS THAT SHOULD BE REPORTED IMMEDIATELY: ?*FEVER GREATER THAN 100.4 F (38 ?C) OR HIGHER ?*CHILLS OR SWEATING ?*NAUSEA AND VOMITING THAT IS NOT CONTROLLED WITH YOUR NAUSEA MEDICATION ?*UNUSUAL SHORTNESS OF BREATH ?*UNUSUAL BRUISING OR BLEEDING ?*URINARY PROBLEMS (pain or burning when urinating, or frequent urination) ?*BOWEL PROBLEMS (unusual diarrhea, constipation, pain near the anus) ?TENDERNESS IN MOUTH AND THROAT WITH OR WITHOUT PRESENCE OF ULCERS (sore throat, sores in mouth, or a toothache) ?UNUSUAL RASH, SWELLING OR PAIN  ?UNUSUAL VAGINAL DISCHARGE OR ITCHING  ? ?Items with * indicate a potential emergency and should be followed up as soon as possible or go to the Emergency Department if any problems should occur. ? ?Please show the CHEMOTHERAPY ALERT CARD or IMMUNOTHERAPY ALERT CARD at  check-in to the Emergency Department and triage nurse. ? ?Should you have questions after your visit or need to cancel or reschedule your appointment, please contact [facility]  Dept: [phone]  and follow the prompts.  Office hours are 8:00 a.m. to 4:30 p.m. Monday - Friday. Please note that voicemails left after 4:00 p.m. may not be returned until the following business day.  We are closed weekends and major holidays. You have access to a nurse at all times for urgent questions. Please call the main number to the clinic Dept: [phone] and follow the prompts. ? ? ?For any non-urgent questions, you may also contact your provider using MyChart. We now offer e-Visits for anyone 31 and older to request care online for non-urgent symptoms. For details visit [email or web address]. ?  ?Also download the MyChart app! Go to the app store, search "MyChart", open the app, select [facility], and log in with your MyChart username and password. ? ?Due to Covid, a mask is required upon entering the hospital/clinic. If you do not have a mask, one will be given to you upon arrival. For doctor visits, patients may have 1 support person aged 8 or older with them. For treatment visits, patients cannot have anyone with them due to current Covid guidelines and our immunocompromised population.  ? ?

## 2022-03-26 ENCOUNTER — Other Ambulatory Visit: Payer: Self-pay | Admitting: Hematology

## 2022-03-27 ENCOUNTER — Other Ambulatory Visit: Payer: Self-pay

## 2022-03-27 DIAGNOSIS — C181 Malignant neoplasm of appendix: Secondary | ICD-10-CM

## 2022-03-27 MED ORDER — CAPECITABINE 500 MG PO TABS: 1000.0000 mg/m2 | ORAL_TABLET | Freq: Two times a day (BID) | ORAL | 1 refills | Status: DC

## 2022-03-31 DIAGNOSIS — H25812 Combined forms of age-related cataract, left eye: Secondary | ICD-10-CM | POA: Diagnosis not present

## 2022-03-31 DIAGNOSIS — H04123 Dry eye syndrome of bilateral lacrimal glands: Secondary | ICD-10-CM | POA: Diagnosis not present

## 2022-03-31 DIAGNOSIS — H35413 Lattice degeneration of retina, bilateral: Secondary | ICD-10-CM | POA: Diagnosis not present

## 2022-04-09 ENCOUNTER — Ambulatory Visit (HOSPITAL_COMMUNITY)
Admission: RE | Admit: 2022-04-09 | Discharge: 2022-04-09 | Disposition: A | Discharge status: Home / Self Care | Payer: Medicare HMO | Source: Ambulatory Visit | Attending: Hematology | Admitting: Hematology

## 2022-04-09 DIAGNOSIS — C20 Malignant neoplasm of rectum: Secondary | ICD-10-CM | POA: Insufficient documentation

## 2022-04-09 DIAGNOSIS — M47816 Spondylosis without myelopathy or radiculopathy, lumbar region: Secondary | ICD-10-CM | POA: Diagnosis not present

## 2022-04-09 DIAGNOSIS — K6389 Other specified diseases of intestine: Secondary | ICD-10-CM | POA: Diagnosis not present

## 2022-04-09 DIAGNOSIS — D7389 Other diseases of spleen: Secondary | ICD-10-CM | POA: Diagnosis not present

## 2022-04-09 DIAGNOSIS — J984 Other disorders of lung: Secondary | ICD-10-CM | POA: Diagnosis not present

## 2022-04-09 DIAGNOSIS — M19012 Primary osteoarthritis, left shoulder: Secondary | ICD-10-CM | POA: Diagnosis not present

## 2022-04-09 DIAGNOSIS — M4316 Spondylolisthesis, lumbar region: Secondary | ICD-10-CM | POA: Diagnosis not present

## 2022-04-09 DIAGNOSIS — K439 Ventral hernia without obstruction or gangrene: Secondary | ICD-10-CM | POA: Diagnosis not present

## 2022-04-09 MED ORDER — IOHEXOL 300 MG/ML  SOLN
80.0000 mL | Freq: Once | INTRAMUSCULAR | Status: AC | PRN
  Administered 2022-04-09: 80 mL via INTRAVENOUS

## 2022-04-09 MED ORDER — SODIUM CHLORIDE (PF) 0.9 % IJ SOLN
INTRAMUSCULAR | Status: AC
  Filled 2022-04-09: qty 50

## 2022-04-09 MED ORDER — HEPARIN SOD (PORK) LOCK FLUSH 100 UNIT/ML IV SOLN
INTRAVENOUS | Status: AC
  Administered 2022-04-09: 500 [IU]
  Filled 2022-04-09: qty 5

## 2022-04-13 ENCOUNTER — Other Ambulatory Visit: Payer: Self-pay

## 2022-04-13 ENCOUNTER — Inpatient Hospital Stay: Payer: Medicare HMO | Attending: Hematology

## 2022-04-13 ENCOUNTER — Telehealth: Payer: Self-pay

## 2022-04-13 ENCOUNTER — Encounter: Payer: Self-pay | Admitting: Hematology

## 2022-04-13 ENCOUNTER — Inpatient Hospital Stay: Payer: Medicare HMO

## 2022-04-13 ENCOUNTER — Inpatient Hospital Stay: Payer: Medicare HMO | Admitting: Hematology

## 2022-04-13 ENCOUNTER — Other Ambulatory Visit: Payer: Self-pay | Admitting: Hematology

## 2022-04-13 VITALS — BP 122/85 | HR 97 | Temp 97.5°F | Resp 18

## 2022-04-13 VITALS — BP 83/67

## 2022-04-13 DIAGNOSIS — Z5111 Encounter for antineoplastic chemotherapy: Secondary | ICD-10-CM | POA: Insufficient documentation

## 2022-04-13 DIAGNOSIS — F419 Anxiety disorder, unspecified: Secondary | ICD-10-CM | POA: Diagnosis not present

## 2022-04-13 DIAGNOSIS — I10 Essential (primary) hypertension: Secondary | ICD-10-CM | POA: Diagnosis not present

## 2022-04-13 DIAGNOSIS — C181 Malignant neoplasm of appendix: Secondary | ICD-10-CM

## 2022-04-13 DIAGNOSIS — Z79899 Other long term (current) drug therapy: Secondary | ICD-10-CM | POA: Diagnosis not present

## 2022-04-13 DIAGNOSIS — J439 Emphysema, unspecified: Secondary | ICD-10-CM | POA: Diagnosis not present

## 2022-04-13 DIAGNOSIS — Z86718 Personal history of other venous thrombosis and embolism: Secondary | ICD-10-CM | POA: Diagnosis not present

## 2022-04-13 DIAGNOSIS — Z933 Colostomy status: Secondary | ICD-10-CM | POA: Insufficient documentation

## 2022-04-13 DIAGNOSIS — Z7952 Long term (current) use of systemic steroids: Secondary | ICD-10-CM | POA: Insufficient documentation

## 2022-04-13 DIAGNOSIS — Z5112 Encounter for antineoplastic immunotherapy: Secondary | ICD-10-CM | POA: Insufficient documentation

## 2022-04-13 DIAGNOSIS — C20 Malignant neoplasm of rectum: Secondary | ICD-10-CM

## 2022-04-13 DIAGNOSIS — C799 Secondary malignant neoplasm of unspecified site: Secondary | ICD-10-CM

## 2022-04-13 DIAGNOSIS — R531 Weakness: Secondary | ICD-10-CM | POA: Diagnosis not present

## 2022-04-13 DIAGNOSIS — Z86711 Personal history of pulmonary embolism: Secondary | ICD-10-CM | POA: Insufficient documentation

## 2022-04-13 DIAGNOSIS — Z7901 Long term (current) use of anticoagulants: Secondary | ICD-10-CM | POA: Insufficient documentation

## 2022-04-13 DIAGNOSIS — R058 Other specified cough: Secondary | ICD-10-CM | POA: Diagnosis not present

## 2022-04-13 DIAGNOSIS — C786 Secondary malignant neoplasm of retroperitoneum and peritoneum: Secondary | ICD-10-CM | POA: Insufficient documentation

## 2022-04-13 LAB — CMP (CANCER CENTER ONLY)
ALT: 10 U/L (ref 0–44)
AST: 17 U/L (ref 15–41)
Albumin: 3.8 g/dL (ref 3.5–5.0)
Alkaline Phosphatase: 115 U/L (ref 38–126)
Anion gap: 8 (ref 5–15)
BUN: 15 mg/dL (ref 8–23)
CO2: 23 mmol/L (ref 22–32)
Calcium: 8.8 mg/dL — ABNORMAL LOW (ref 8.9–10.3)
Chloride: 101 mmol/L (ref 98–111)
Creatinine: 1.02 mg/dL — ABNORMAL HIGH (ref 0.44–1.00)
GFR, Estimated: 60 mL/min — ABNORMAL LOW (ref >60)
Glucose, Bld: 100 mg/dL — ABNORMAL HIGH (ref 70–99)
Potassium: 4.1 mmol/L (ref 3.5–5.1)
Sodium: 132 mmol/L — ABNORMAL LOW (ref 135–145)
Total Bilirubin: 0.6 mg/dL (ref 0.3–1.2)
Total Protein: 7 g/dL (ref 6.5–8.1)

## 2022-04-13 LAB — CBC WITH DIFFERENTIAL (CANCER CENTER ONLY)
Abs Immature Granulocytes: 0.02 10*3/uL (ref 0.00–0.07)
Basophils Absolute: 0 10*3/uL (ref 0.0–0.1)
Basophils Relative: 0 %
Eosinophils Absolute: 0.1 10*3/uL (ref 0.0–0.5)
Eosinophils Relative: 2 %
HCT: 35.3 % — ABNORMAL LOW (ref 36.0–46.0)
Hemoglobin: 12 g/dL (ref 12.0–15.0)
Immature Granulocytes: 0 %
Lymphocytes Relative: 11 %
Lymphs Abs: 0.8 10*3/uL (ref 0.7–4.0)
MCH: 36 pg — ABNORMAL HIGH (ref 26.0–34.0)
MCHC: 34 g/dL (ref 30.0–36.0)
MCV: 106 fL — ABNORMAL HIGH (ref 80.0–100.0)
Monocytes Absolute: 0.7 10*3/uL (ref 0.1–1.0)
Monocytes Relative: 11 %
Neutro Abs: 5.2 10*3/uL (ref 1.7–7.7)
Neutrophils Relative %: 76 %
Platelet Count: 229 10*3/uL (ref 150–400)
RBC: 3.33 MIL/uL — ABNORMAL LOW (ref 3.87–5.11)
RDW: 14.7 % (ref 11.5–15.5)
WBC Count: 6.8 10*3/uL (ref 4.0–10.5)
nRBC: 0 % (ref 0.0–0.2)

## 2022-04-13 LAB — TOTAL PROTEIN, URINE DIPSTICK: Protein, ur: 30 mg/dL — AB

## 2022-04-13 LAB — CEA (IN HOUSE-CHCC): CEA (CHCC-In House): 5.83 ng/mL — ABNORMAL HIGH (ref 0.00–5.00)

## 2022-04-13 MED ORDER — SODIUM CHLORIDE 0.9 % IV SOLN: Freq: Once | INTRAVENOUS | Status: AC

## 2022-04-13 MED ORDER — SODIUM CHLORIDE 0.9% FLUSH
10.0000 mL | Freq: Once | INTRAVENOUS | Status: AC
  Administered 2022-04-13: 10 mL

## 2022-04-13 MED ORDER — DOXYCYCLINE HYCLATE 100 MG PO TABS
100.0000 mg | ORAL_TABLET | Freq: Two times a day (BID) | ORAL | 0 refills | Status: DC
Start: 2022-04-13 — End: 2022-10-12

## 2022-04-13 MED ORDER — CAPECITABINE 500 MG PO TABS: ORAL_TABLET | ORAL | 1 refills | Status: DC

## 2022-04-13 MED ORDER — SODIUM CHLORIDE 0.9 % IV SOLN: Freq: Once | INTRAVENOUS | Status: DC

## 2022-04-13 MED ORDER — SODIUM CHLORIDE 0.9 % IV SOLN
400.0000 mg | Freq: Once | INTRAVENOUS | Status: AC
  Administered 2022-04-13: 400 mg via INTRAVENOUS
  Filled 2022-04-13: qty 16

## 2022-04-13 MED ORDER — SODIUM CHLORIDE 0.9% FLUSH
10.0000 mL | INTRAVENOUS | Status: DC | PRN
  Administered 2022-04-13: 10 mL

## 2022-04-13 MED ORDER — HEPARIN SOD (PORK) LOCK FLUSH 100 UNIT/ML IV SOLN
500.0000 [IU] | Freq: Once | INTRAVENOUS | Status: AC | PRN
  Administered 2022-04-13: 500 [IU]

## 2022-04-13 NOTE — Telephone Encounter (Signed)
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  Asked Holly to please contact Dr. Ernestina Penna office regarding pt's upcoming Cataract surgery.  Pt is actively receiving chemotherapy (Oxaliplatin) and Dr. Burr Medico would like to coordinate care with the surgeon.  Dr. Burr Medico has NOT cleared this pt for surgery.  Asked is Earnest Bailey or someone from [employer] could please contact Dr. Ernestina Penna office regarding this matter. ?

## 2022-04-13 NOTE — Telephone Encounter (Signed)
Spoke with pt to inform her that Dr. Burr Medico would like for her to stop taking her Losartan for now d/t her BP being low.  Informed pt that Dr. Burr Medico will instruct her on when to restart her Losartan.  Pt verbalized understanding and has no further questions or concerns at this time. ?

## 2022-04-13 NOTE — Progress Notes (Signed)
Per Dr. Burr Medico patient will receive 526m of IVF and Bevacizumab today. No Oxaliplatin today due to low BP and patient reporting dizziness and overall lethargy.  ?

## 2022-04-13 NOTE — Patient Instructions (Signed)
[facility]  Discharge Instructions: °Thank you for choosing [facility] to provide your oncology and hematology care.  ° °If you have a lab appointment with the Cancer Center, please go directly to the Cancer Center and check in at the registration area. °  °Wear comfortable clothing and clothing appropriate for easy access to any Portacath or PICC line.  ° °We strive to give you quality time with your provider. You may need to reschedule your appointment if you arrive late (15 or more minutes).  Arriving late affects you and other patients whose appointments are after yours.  Also, if you miss three or more appointments without notifying the office, you may be dismissed from the clinic at the provider’s discretion.    °  °For prescription refill requests, have your pharmacy contact our office and allow 72 hours for refills to be completed.   ° °Today you received the following chemotherapy and/or immunotherapy agents: Zirabev    °  °To help prevent nausea and vomiting after your treatment, we encourage you to take your nausea medication as directed. ° °BELOW ARE SYMPTOMS THAT SHOULD BE REPORTED IMMEDIATELY: °*FEVER GREATER THAN 100.4 F (38 °C) OR HIGHER °*CHILLS OR SWEATING °*NAUSEA AND VOMITING THAT IS NOT CONTROLLED WITH YOUR NAUSEA MEDICATION °*UNUSUAL SHORTNESS OF BREATH °*UNUSUAL BRUISING OR BLEEDING °*URINARY PROBLEMS (pain or burning when urinating, or frequent urination) °*BOWEL PROBLEMS (unusual diarrhea, constipation, pain near the anus) °TENDERNESS IN MOUTH AND THROAT WITH OR WITHOUT PRESENCE OF ULCERS (sore throat, sores in mouth, or a toothache) °UNUSUAL RASH, SWELLING OR PAIN  °UNUSUAL VAGINAL DISCHARGE OR ITCHING  ° °Items with * indicate a potential emergency and should be followed up as soon as possible or go to the Emergency Department if any problems should occur. ° °Please show the CHEMOTHERAPY ALERT CARD or IMMUNOTHERAPY ALERT CARD at check-in to  the Emergency Department and triage nurse. ° °Should you have questions after your visit or need to cancel or reschedule your appointment, please contact [facility]  Dept: [phone]  and follow the prompts.  Office hours are 8:00 a.m. to 4:30 p.m. Monday - Friday. Please note that voicemails left after 4:00 p.m. may not be returned until the following business day.  We are closed weekends and major holidays. You have access to a nurse at all times for urgent questions. Please call the main number to the clinic Dept: [phone] and follow the prompts. ° ° °For any non-urgent questions, you may also contact your provider using MyChart. We now offer e-Visits for anyone 18 and older to request care online for non-urgent symptoms. For details visit [email or web address]. °  °Also download the MyChart app! Go to the app store, search "MyChart", open the app, select [facility], and log in with your MyChart username and password. ° °Due to Covid, a mask is required upon entering the hospital/clinic. If you do not have a mask, one will be given to you upon arrival. For doctor visits, patients may have 1 support person aged 18 or older with them. For treatment visits, patients cannot have anyone with them due to current Covid guidelines and our immunocompromised population.  ° °

## 2022-04-13 NOTE — Progress Notes (Signed)
[facility]   [phone] [phone]   Clinic Follow up Note   Patient Care Team: Natalia Leatherwood, DO as PCP - General (Family Medicine) Malachy Mood, MD as Consulting Physician (Hematology) Icard, Rachel Bo, DO as Consulting Physician (Pulmonary Disease) [facility] Luretha Murphy, MD as Consulting Physician (General Surgery) Romie Levee, MD as Consulting Physician (General Surgery) [name], Willaim Rayas, MD as Consulting Physician (Gastroenterology)  Date of Service:  04/13/2022  CHIEF COMPLAINT: f/u of colon cancer  CURRENT THERAPY:  CAPOX -Xeloda started 02/01/21, currently 1500 mg BID 7 days on/7 days off -Bevacizumab q2weeks added with C2 02/24/21             -Beva held 09/02/21-12/28/21 d/t PE -Oxaliplatin added with C4 03/24/21, currently q3weeks  ASSESSMENT & PLAN:  Tracy [name] is a 71 y.o. female with   1. [name], MMR normal, peritoneal metastasis in 01/2021 -diagnosed 02/2020, due to perforation she underwent emergent surgery. Path showed invasive adenocarcinoma with perforation, +3/15 LNs, and clear margins. Initial CT scan negative for distant metastasis. She was not a candidate for adjuvant chemo due to very slow recovery from surgery -Guardant Reveal ctDNA were negative (06/06/20, 07/12/20, and 09/10/20). -surveillance colonoscopy by Dr. Adela Lank 01/06/21 showed new adenocarcinoma at appendiceal orifice. Biopsy confirmed adenocarcinoma -01/27/21 PET showed tumor in appendiceal orifice is likely peritoneal metastasis growing into the cecum.  Her peritoneal implants are mainly in the pelvis, difficult to biopsy -She began single agent Xeloda with cycle 1 on 02/01/21.  Due to low performance status with cycle 2 was changed to 1 week on/1 week off, and beva was added. She tolerates this well -PS improved and low dose oxaliplatin was added with C4, now on Capeox/beva q3 weeks, with  Xeloda 1 week on/1 week off. -Beva was held for PE found on CT 08/2021, she is on xarelto. Resumed beva q3 weeks 12/29/21 -restaging CT CAP on 04/09/22 was stable, with no new or progressive findings. I reviewed the results with her today. -labs reviewed, overall stable, urine protein 30, adequate to proceed with beva. -Due to her borderline hypotension (likely related to medication), and recurrent productive cough, I will hold on oxaliplatin this cycle, and postpone her Xeloda for 1 week, she will start next Monday if she feels better. -Follow-up in 3 weeks.  2. Productive Cough, Asthma -she takes trelegy, benadryl, flonase, and albuterol for her allergies/asthma. -she completed a cycle of doxycycline/prednisone in 03/2022 for a possible sinus infection following allergies. She initially noted improvement. -CT CAP on 04/09/22 was stable, no new nodules or masses. -she reports she is still having a productive cough, producing green/black mucus. I refilled her doxycycline today.   3. HTN, New weakness -secondary to Beva -she has an intolerance listed for amlodipine. She started losartan 25mg  daily on 03/02/22. -she reports feeling weak this morning. We will recheck her BP.   4. Bilateral PE, found on restaging CT CAP 08/14/21  -no hypoxia, stable respiratory status. She began xarelto 08/15/21 -Beva was on hold, CT 12/25/21 was clear, Beva resumed 12/29/21   5. COVID-19 + 05/21/21 and respiratory infection -she received CAPOX/beva on 05/19/21, tested positive on 05/21/21 -treated with paxlovid and subsequently doxycycline, augment, and prednisone. Recovered well.   6. Comorbidities: Asthma, COPD, anxiety -on nebulizer and trelegy, has not needed rescue inhaler recently -f/up pulmonology and PCP.      PLAN: -proceed with Beva today, will give 500 cc normal saline today  due to her orthostatic hypotension -Hold oxaliplatin today, postpone Xeloda till next Monday -I refilled doxycyline and Xeloda  today -lab, flush, f/u, and oxali/beva in 3 and 6 weeks   No problem-specific Assessment & Plan notes found for this encounter.   SUMMARY OF ONCOLOGIC HISTORY: Oncology History Overview Note  Cancer Staging Rectal cancer [facility]) Staging form: Colon and Rectum, AJCC 8th Edition - Pathologic stage from 03/03/2020: Stage IIIB (pT4a, pN1b, cM0) - Signed by Malachy Mood, MD on 01/28/2021 Stage prefix: Initial diagnosis Histologic grading system: 4 grade system Histologic grade (G): G2 Residual tumor (R): R0 - None    Rectal cancer (HCC)  03/03/2020 - 04/05/2020 Hospital Admission   She was admitted to ED on 03/03/20 for abdominal pain and nausea. She had been having diarrhea for several weeks. During hospital stay she developed acute respiratory failure, AKI, RUE DVT from PICC line. Work up showed bowel perforation, small left liver mass and thickening of jejunum. She underwent emergent surgery on 03/03/20 for resection and colostomy placement. Her path showed invasive cancer, metastatic to 3/15 LNs. She had a NGT in placed but this was removed on POD 3. Post op her stoma became necrotic and septic. She had another bowel surgery on 03/12/20, which was NED with necrotic tissue.    03/03/2020 Imaging   CT AP 03/03/20  IMPRESSION: 1. Free intraperitoneal air consistent perforation. Most likely site of perforation is in the distal splenic flexure/proximal descending colon where there is a collection of air and gas measuring 6 centimeters. No obvious soft tissue mass identified in this region. At this site, there is abrupt transition of dilated, stool-filled colon to completely decompressed proximal descending colon. 2. Thickened, inflamed loops of jejunum are identified within the pelvis and are likely reactive. 3. Small hiatal hernia. 4. Benign-appearing 1.6 centimeter mass the LEFT hepatic lobe. Recommend comparison with prior studies if available. 5.  Emphysema (ICD10-J43.9). 6.  Aortic  Atherosclerosis (ICD10-I70.0). 7. Bilateral renal scarring.   03/03/2020 Surgery   low anterior resection end colostomy by Dr Maisie Fus    03/03/2020 Initial Biopsy   FINAL MICROSCOPIC DIAGNOSIS: 03/03/20 A. RECTOSIGMOID COLON, LOW ANTERIOR RESECTION:  - Invasive colonic adenocarcinoma, 3.5 cm.  - Tumor invades the visceral peritoneum.  - Margins of resection are not involved.  - Metastatic carcinoma in (3) of (15) lymph nodes.  - See oncology table.   B. ADDITIONAL SIGMOID COLON, RESECTION:  - Colonic tissue, negative for carcinoma.  ADDENDUM:  Mismatch Repair Protein (IHC)   SUMMARY INTERPRETATION: NORMAL  There is preserved expression of the major MMR proteins. There is a very  low probability that microsatellite instability (MSI) is present.  However, certain clinically significant MMR protein mutations may result  in preservation of nuclear expression. It is recommended that the  preservation of protein expression be correlated with molecular based  MSI testing.   IHC EXPRESSION RESULTS  TEST           RESULT  MLH1:          Preserved nuclear expression  MSH2:          Preserved nuclear expression  MSH6:          Preserved nuclear expression  PMS2:          Preserved nuclear expression   03/03/2020 Cancer Staging   Staging form: Colon and Rectum, AJCC 8th Edition - Pathologic stage from 03/03/2020: Stage IIIB (pT4a, pN1b, cM0) - Signed by Malachy Mood, MD on 01/28/2021 Stage prefix: Initial diagnosis  Histologic grading system: 4 grade system Histologic grade (G): G2 Residual tumor (R): R0 - None    03/08/2020 Imaging   CT AP 03/08/20 IMPRESSION: 1. Interval midline laparotomy with distal colon resection and diverting left lower quadrant colostomy. 2. Diffuse small bowel dilatation with gas fluid levels most consistent with postoperative ileus. 3. Trace ascites within the abdomen and pelvis. No fluid collection or abscess at this time. Surgical drain within the lower  pelvis. 4. Indeterminate 1.4 cm subcapsular liver hypodensity. In light of newly diagnosed rectal cancer, metastatic disease cannot be excluded. PET CT may be useful for further evaluation. 5. Interval development of trace bilateral pleural effusions and diffuse body wall edema.   03/12/2020 Surgery   EXPLORATORY LAPAROTOMY WITH BOWEL RESECTION AND COLOSTOMY by Dr Daphine Deutscher 03/12/20  FINAL MICROSCOPIC DIAGNOSIS: 03/12/20  A. COLON, SPLENIC FLEXURE, RESECTION:  - Segment of colon (37 cm) with perforation and associated inflammation  - Multiple mucosal ulcers with necrotizing inflammation  - No evidence of malignancy    03/12/2020 Imaging   CT AP WO contrast 03/12/20  IMPRESSION: 1. Exam is limited by lack of intravenous contrast, motion, and artifact from the patient's arms adjacent to the torso. Since 03/08/2020, there has been development of relatively large pockets of intraperitoneal free air. While intraperitoneal gas would not be unexpected on postoperative day 9, this gas is new since an intervening study of 03/08/2020 and given the relatively large volume certainly raises concern for bowel perforation. No source for the intraperitoneal free air is evident on this study. There is a surgical drain in the pelvis, but there is no free gas around the drain itself to suggest that it represents the source. 2. New circumferential wall thickening in the splenic flexure, descending colon and sigmoid colon leading into the end colostomy. Infection/inflammation would be a consideration. Ischemia cannot be excluded. 3. Relatively small volume intraperitoneal free fluid. High attenuation small fluid collections in the left upper abdomen may reflect hemorrhage, infection or residua from prior perforation. 4. Interval progression of diffuse body wall edema. 5. Residual contrast material in the renal parenchyma from prior imaging, compatible with renal dysfunction.    03/19/2020 Imaging   CT  CAP 03/19/20  IMPRESSION: 1. 5.5 x 3.2 cm fluid collection is noted along the greater curvature of the proximal stomach. 2. Surgical drain is again noted in the pelvis with tip in left lower quadrant. 3. Interval development of crescent-shaped fluid collection measuring 16.4 x 3.3 cm in the epigastric region and left upper quadrant of the abdomen which may extend into the left lower quadrant. Potentially this may represent abscess or developing abscess. Multiple other smaller fluid collections are noted which may represent small abscesses. 4. 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 5. Mild amount of free fluid is noted in the posterior pelvis. 6. Moderate anasarca is noted. Aortic Atherosclerosis (ICD10-I70.0).   04/12/2020 Imaging   CT AP 04/12/20  IMPRESSION: 1. Fluid collections within the LEFT abdomen have significantly decreased compared to previous CT exams, now nearly completely resolved. 2. Percutaneous drainage catheter with tip coiled posterior to the LEFT kidney, stable positioning compared to the previous study. The more anterior catheter has been removed. 3. Small amount of free fluid persists within the abdomen and pelvis. 4. Trace bibasilar pleural effusions. 5. Anasarca. 6. While reviewing today's study, comparing with a chest/abdomen/pelvis CT from earlier same month, there is question of thrombus in the LEFT internal jugular vein. Recommend ultrasound of the LEFT IJ to exclude  DVT. This recommendation discussed with patient's hospitalist, Dr. Manson Passey, on 04/12/2020 at 4:20 p.m.   Emphysema (ICD10-J43.9).   04/22/2020 Imaging   CT AP 04/22/20  IMPRESSION: 1. No recurrent intra-abdominal abscess status post interval removal of left retroperitoneal percutaneous drain. Stable small amount of free pelvic fluid. 2. Enlarging dependent bilateral pleural effusions, now moderate in volume. Associated atelectasis at both lung bases. 3. Progressive anasarca with  generalized edema throughout the subcutaneous fat. 4. Stable small subcapsular fluid collection along the anterior aspect of the left hepatic lobe. 5. Aortic Atherosclerosis (ICD10-I70.0).   05/23/2020 Initial Diagnosis   Rectal cancer (HCC)   01/14/2021 Imaging   CT CAP IMPRESSION: -5.0 cm soft tissue mass along the posterior aspect of the cecum at the base of the appendix, favored to reflect a peritoneal/serosal implant, corresponding to the patient's known adenocarcinoma. This is new from the prior. -Suspected additional pelvic implants along the uterus and right adnexa, poorly visualized, new/progressive from the prior. -Additional pericapsular lesion along the lateral spleen is mildly progressive, suspicious for additional peritoneal implant. -No evidence of metastatic disease in the chest.   01/27/2021 PET scan   IMPRESSION:  1. Extensive hypermetabolic peritoneal metastasis, as detailed  above.  2. No evidence of hypermetabolic supradiaphragmatic disease.  3. Diffuse low-level thyroid hypermetabolism can be seen in the  setting of thyroiditis. Consider correlation with thyroid function  labs.  4. Aortic atherosclerosis (ICD10-I70.0) and emphysema (ICD10-J43.9).    02/01/2021 -  Chemotherapy    First-line chemo Xeloda 1500 mg twice daily for day 1-14, every 21 days, started on 02/01/2021. Reduced to 1 week on/ 1 week off and added bevacizumab q2weeks from C2 on 02/24/21.  ----Given good tolerance, I changed to CAPOX and bevacizumab q2weeks from C4 on 03/24/21 with Xeloda 1500mg  in the AM and 1000mg  in the PM 1 week on/1 week off.     05/08/2021 Imaging   PET  IMPRESSION: 1. There is been interval development of several FDG avid pulmonary nodules within both lungs. Additionally, there is new FDG avid low left paratracheal lymph node. Thoracic metastasis cannot be excluded. 2. Interval improvement in multifocal FDG avid peritoneal metastasis as detailed above.     08/14/2021  Imaging   CT CAP  IMPRESSION: Resolution of previously seen small bilateral pulmonary nodules since previous study.   Stable 8 mm mediastinal lymph node in the AP window.   No evidence of metastatic disease within the abdomen or pelvis.   Nonocclusive pulmonary embolism in bilateral central lower lobe pulmonary arteries, which appears subacute in age.   12/25/2021 Imaging   EXAM: CT CHEST, ABDOMEN, AND PELVIS WITH CONTRAST  IMPRESSION: 1. Minimal, bandlike residua of previously seen FDG avid pulmonary nodules, consistent with treated metastases. No new pulmonary nodules. 2. Unchanged post treatment appearance of the cecal base, with soft tissue thickening about the cecal base and adjacent pelvis, previously FDG avid and in keeping with known appendiceal orifice malignancy. 3. Unchanged central fluid attenuation lesions about the spleen, left iliopsoas, and left pelvic sidewall, previously FDG avid, consistent with treated peritoneal implants. 4. No evidence of new metastatic disease in the chest, abdomen, or pelvis 5. Status post rectosigmoid colon resection with left lower quadrant end colostomy.   Aortic Atherosclerosis (ICD10-I70.0).   Malignant neoplasm of appendix (HCC)  01/06/2021 Procedure   (surveillance colonoscopy for h/o rectal cancer) Impression:  - Preparation of the colon was fair, lavage performed with mostly adequate views. - Polypoid lesion obliterating appendiceal orifice. Biopsied to evaluate for  malignancy. - The examined portion of the ileum was normal. - Two large polyps in the cecum, not removed today pending path at appendiceal orifice, bowel prep. If removed endoscopically in the future would favor EMR to be done at the hospital. - One 5 mm polyp at the ileocecal valve, removed with a cold snare. Resected and retrieved. - One 3 mm polyp in the transverse colon, removed with a cold snare. Resected and retrieved. - Parastomal hernia making initial entry  to the distal colon challenging. - The examination was otherwise normal.   01/06/2021 Initial Biopsy   Diagnosis 1. Colon, biopsy, appendiceal oraface - ADENOCARCINOMA. 2. Colon, polyp(s), ileocecal valve, transverse, x2 - TUBULAR ADENOMA, NEGATIVE FOR HIGH GRADE DYSPLASIA (X1). - COLONIC MUCOSA WITH UNDERLYING LYMPHOID AGGREGATE, NEGATIVE FOR DYSPLASIA (X1).  MMR normal   01/06/2021 Initial Diagnosis   Malignant neoplasm of appendix (HCC)   01/06/2021 Cancer Staging   Staging form: Appendix - Carcinoma, AJCC 8th Edition - Clinical stage from 01/06/2021: Stage IVC (cTX, cNX, cM1c) - Signed by Malachy Mood, MD on 12/29/2021    01/14/2021 Imaging   CT CAP IMPRESSION: -5.0 cm soft tissue mass along the posterior aspect of the cecum at the base of the appendix, favored to reflect a peritoneal/serosal implant, corresponding to the patient's known adenocarcinoma. This is new from the prior. -Suspected additional pelvic implants along the uterus and right adnexa, poorly visualized, new/progressive from the prior. -Additional pericapsular lesion along the lateral spleen is mildly progressive, suspicious for additional peritoneal implant. -No evidence of metastatic disease in the chest.   08/14/2021 Imaging   CT CAP IMPRESSION: Resolution of previously seen small bilateral pulmonary nodules since previous study. Stable 8 mm mediastinal lymph node in the AP window. No evidence of metastatic disease within the abdomen or pelvis. Nonocclusive pulmonary embolism in bilateral central lower lobe pulmonary arteries, which appears subacute in age.  Aortic Atherosclerosis (ICD10-I70.0) and Emphysema (ICD10-J43.9).     08/14/2021 Imaging   CT CAP  IMPRESSION: Resolution of previously seen small bilateral pulmonary nodules since previous study.   Stable 8 mm mediastinal lymph node in the AP window.   No evidence of metastatic disease within the abdomen or pelvis.   Nonocclusive pulmonary  embolism in bilateral central lower lobe pulmonary arteries, which appears subacute in age.   12/25/2021 Imaging   EXAM: CT CHEST, ABDOMEN, AND PELVIS WITH CONTRAST  IMPRESSION: 1. Minimal, bandlike residua of previously seen FDG avid pulmonary nodules, consistent with treated metastases. No new pulmonary nodules. 2. Unchanged post treatment appearance of the cecal base, with soft tissue thickening about the cecal base and adjacent pelvis, previously FDG avid and in keeping with known appendiceal orifice malignancy. 3. Unchanged central fluid attenuation lesions about the spleen, left iliopsoas, and left pelvic sidewall, previously FDG avid, consistent with treated peritoneal implants. 4. No evidence of new metastatic disease in the chest, abdomen, or pelvis 5. Status post rectosigmoid colon resection with left lower quadrant end colostomy.   Aortic Atherosclerosis (ICD10-I70.0).      INTERVAL HISTORY:  Tracy [name] is here for a follow up of colon cancer. She was last seen by PA Cassie on 03/23/22. She presents to the clinic alone. She reports feeling weak this morning. She reports she also has a productive cough, producing green to black mucus. She notes she finished her course of antibiotics about 2 weeks ago and reports the coughing started after that. She notes she is having cataract surgery soon.   All other  systems were reviewed with the patient and are negative.  MEDICAL HISTORY:  Past Medical History:  Diagnosis Date   Acute deep vein thrombosis (DVT) of brachial vein of right upper extremity (HCC)    Acute on chronic respiratory failure with hypoxia (HCC)    Asthma    Cataract    CHICKENPOX, HX OF 01/06/2011   Qualifier: Diagnosis of  By: Abner Greenspan MD, Stacey     Colon cancer [facility])    COPD (chronic obstructive pulmonary disease) (HCC) 2011   FeV1 31% predicted FeV1/FVX 47 %   COVID-19 05/2021   Essential hypertension 05/23/2020   Hemorrhoid    Open right radial  fracture 2020   Osteopenia    Perforated sigmoid colon (HCC) 03/03/2020   Pleural effusion    Pneumonia 2021   Postoperative intra-abdominal abscess 2021   Rectal cancer (HCC)    Seasonal allergies    takes Claritin daily prn   Shock circulatory (HCC)    Tracheostomy status (HCC)    Vitamin D deficiency    takes Vit d every 14 days    SURGICAL HISTORY: Past Surgical History:  Procedure Laterality Date   AUGMENTATION MAMMAPLASTY     saline   EXAMINATION UNDER ANESTHESIA  10/14/2012   Procedure: EXAM UNDER ANESTHESIA;  Surgeon: Atilano Ina, MD,FACS;  Location: [facility] OR;  Service: General;  Laterality: N/A;  rectal exam under anesthesia excisional hemorrhoidectomy hemorrhoidal banding x two   EXAMINATION UNDER ANESTHESIA  02/03/2013   excision hemorrhoidal tissue   FOOT SURGERY     left bunionectomy   HEMORRHOIDECTOMY WITH HEMORRHOID BANDING  10/14/2012   Procedure: HEMORRHOIDECTOMY WITH HEMORRHOID BANDING;  Surgeon: Atilano Ina, MD,FACS;  Location: [facility] OR;  Service: General;  Laterality: N/A;   IR IMAGING GUIDED PORT INSERTION  02/03/2021   IR THORACENTESIS ASP PLEURAL SPACE W/IMG GUIDE  04/23/2020   LAPAROTOMY N/A 03/03/2020   Procedure: low anterior resection end colostomy;  Surgeon: Romie Levee, MD;  Location: [facility] ORS;  Service: General;  Laterality: N/A;   LAPAROTOMY N/A 03/12/2020   Procedure: EXPLORATORY LAPAROTOMY WITH BOWEL RESECTION AND COLOSTOMY;  Surgeon: Luretha Murphy, MD;  Location: [facility];  Service: General;  Laterality: N/A;   OPEN REDUCTION INTERNAL FIXATION (ORIF) DISTAL RADIAL FRACTURE Right 11/01/2018   Procedure: OPEN REDUCTION INTERNAL FIXATION (ORIF) DISTAL RADIAL FRACTURE;  Surgeon: Betha Loa, MD;  Location: [facility];  Service: Orthopedics;  Laterality: Right;   TONSILLECTOMY  1960   recurrent otitis media   US ECHOCARDIOGRAPHY  02/2020    poor windows, normal LV function, severely dilated RV with moderately reduced function, RV volume and  pressure overload, mildly dilated RA    I have reviewed the social history and family history with the patient and they are unchanged from previous note.  ALLERGIES:  is allergic to amlodipine and codeine.  MEDICATIONS:  Current Outpatient Medications  Medication Sig Dispense Refill   albuterol (PROVENTIL) (2.5 MG/3ML) 0.083% nebulizer solution Take 3 mLs (2.5 mg total) by nebulization every 4 (four) hours as needed for wheezing or shortness of breath. DX: J44.9 360 mL 5   albuterol (VENTOLIN HFA) 108 (90 Base) MCG/ACT inhaler Inhale 2 puffs into the lungs every 6 (six) hours as needed for wheezing or shortness of breath. 8 g 5   ALPRAZolam (XANAX) 0.25 MG tablet Take 1 tablet (0.25 mg total) by mouth at bedtime as needed for anxiety. 30 tablet 0   capecitabine (XELODA) 500 MG tablet Take for 7 days on,  7 days off, repeat every 14 days. 84 tablet 1   docusate sodium (COLACE) 50 MG capsule Take 50 mg by mouth daily as needed for mild constipation.     doxycycline (VIBRA-TABS) 100 MG tablet Take 1 tablet (100 mg total) by mouth 2 (two) times daily. 14 tablet 0   fluticasone (FLONASE) 50 MCG/ACT nasal spray Place 2 sprays into both nostrils daily. 16 mL 5   Fluticasone-Umeclidin-Vilant (TRELEGY ELLIPTA) 100-62.5-25 MCG/INH AEPB Inhale 1 puff into the lungs daily. 60 each 5   lidocaine-prilocaine (EMLA) cream Apply 1 application. topically as needed. 30 g 2   loratadine (CLARITIN) 10 MG tablet Take 10 mg by mouth daily.     losartan (COZAAR) 25 MG tablet TAKE 1 TABLET (25 MG TOTAL) BY MOUTH DAILY. 30 tablet 1   ondansetron (ZOFRAN) 8 MG tablet Take 1 tablet (8 mg total) by mouth every 8 (eight) hours as needed for nausea or vomiting. 20 tablet 2   predniSONE (DELTASONE) 10 MG tablet 4 x 4 days, 3 x 4 days, 2 x 4 days, 1 x 4 days, and then stop 40 tablet 0   prochlorperazine (COMPAZINE) 10 MG tablet Take 1 tablet (10 mg total) by mouth every 6 (six) hours as needed for nausea or vomiting. 30 tablet  2   XARELTO 20 MG TABS tablet TAKE 1 TABLET BY MOUTH DAILY WITH SUPPER 30 tablet 0   No current facility-administered medications for this visit.   Facility-Administered Medications Ordered in Other Visits  Medication Dose Route Frequency Provider Last Rate Last Admin   0.9 %  sodium chloride infusion   Intravenous Once Malachy Mood, MD       sodium chloride flush (NS) 0.9 % injection 10 mL  10 mL Intracatheter PRN Malachy Mood, MD   10 mL at 04/13/22 1305    PHYSICAL EXAMINATION: ECOG PERFORMANCE STATUS: 2 - Symptomatic, <50% confined to bed  Vitals:   04/13/22 1104 04/13/22 1106  BP: (!) 122/56 (!) 83/67   Wt Readings from Last 3 Encounters:  03/23/22 116 lb 6 oz (52.8 kg)  03/02/22 119 lb 9.6 oz (54.3 kg)  02/09/22 117 lb 11.2 oz (53.4 kg)     GENERAL:alert, no distress and comfortable SKIN: skin color, texture, turgor are normal, no rashes or significant lesions EYES: normal, Conjunctiva are pink and non-injected, sclera clear  LUNGS: clear to auscultation and percussion with normal breathing effort HEART: regular rate & rhythm and no murmurs and no lower extremity edema NEURO: alert & oriented x 3 with fluent speech, no focal motor/sensory deficits  LABORATORY DATA:  I have reviewed the data as listed    Latest Ref Rng & Units 04/13/2022   10:32 AM 03/23/2022   10:38 AM 03/02/2022   11:17 AM  CBC  WBC 4.0 - 10.5 K/uL 6.8   4.2   5.0    Hemoglobin 12.0 - 15.0 g/dL 54.0   98.1   19.1    Hematocrit 36.0 - 46.0 % 35.3   36.1   33.7    Platelets 150 - 400 K/uL 229   268   246          Latest Ref Rng & Units 04/13/2022   10:32 AM 03/23/2022   10:38 AM 03/02/2022   11:17 AM  CMP  Glucose 70 - 99 mg/dL 478   295   89    BUN 8 - 23 mg/dL 15   21   16     Creatinine 0.44 -  1.00 mg/dL 2.13   0.86   5.78    Sodium 135 - 145 mmol/L 132   135   139    Potassium 3.5 - 5.1 mmol/L 4.1   3.6   3.4    Chloride 98 - 111 mmol/L 101   101   104    CO2 22 - 32 mmol/L 23   24   28     Calcium  8.9 - 10.3 mg/dL 8.8   9.6   9.1    Total Protein 6.5 - 8.1 g/dL 7.0   7.3   6.5    Total Bilirubin 0.3 - 1.2 mg/dL 0.6   0.7   0.6    Alkaline Phos 38 - 126 U/L 115   120   118    AST 15 - 41 U/L 17   21   17     ALT 0 - 44 U/L 10   11   8         RADIOGRAPHIC STUDIES: I have personally reviewed the radiological images as listed and agreed with the findings in the report. No results found.    No orders of the defined types were placed in this encounter.  All questions were answered. The patient knows to call the clinic with any problems, questions or concerns. No barriers to learning was detected. The total time spent in the appointment was 30 minutes.     Malachy Mood, MD 04/13/2022   I, Mickie Bail, am acting as scribe for Malachy Mood, MD.   I have reviewed the above documentation for accuracy and completeness, and I agree with the above.

## 2022-04-24 ENCOUNTER — Other Ambulatory Visit: Payer: Self-pay | Admitting: Hematology

## 2022-05-01 ENCOUNTER — Telehealth: Payer: Self-pay | Admitting: Pulmonary Disease

## 2022-05-01 MED ORDER — TRELEGY ELLIPTA 100-62.5-25 MCG/ACT IN AEPB
1.0000 | INHALATION_SPRAY | Freq: Every day | RESPIRATORY_TRACT | 3 refills | Status: DC
Start: 2022-05-01 — End: 2022-06-08

## 2022-05-01 MED FILL — Dexamethasone Sodium Phosphate Inj 100 MG/10ML: INTRAMUSCULAR | Qty: 1 | Status: AC

## 2022-05-01 NOTE — Telephone Encounter (Signed)
Called and informed patient that we refilled her Trelegy and sent it to [facility] in [place] on [address]. Patient verbalized understanding. Nothing further needed at this time

## 2022-05-04 ENCOUNTER — Inpatient Hospital Stay: Payer: Medicare HMO | Admitting: Hematology

## 2022-05-04 ENCOUNTER — Inpatient Hospital Stay: Payer: Medicare HMO

## 2022-05-04 ENCOUNTER — Other Ambulatory Visit: Payer: Self-pay

## 2022-05-04 VITALS — BP 148/69 | HR 87 | Temp 97.7°F | Resp 19 | Ht 62.0 in | Wt 115.8 lb

## 2022-05-04 DIAGNOSIS — Z933 Colostomy status: Secondary | ICD-10-CM | POA: Diagnosis not present

## 2022-05-04 DIAGNOSIS — Z79899 Other long term (current) drug therapy: Secondary | ICD-10-CM | POA: Diagnosis not present

## 2022-05-04 DIAGNOSIS — Z7901 Long term (current) use of anticoagulants: Secondary | ICD-10-CM | POA: Diagnosis not present

## 2022-05-04 DIAGNOSIS — Z86718 Personal history of other venous thrombosis and embolism: Secondary | ICD-10-CM | POA: Diagnosis not present

## 2022-05-04 DIAGNOSIS — F419 Anxiety disorder, unspecified: Secondary | ICD-10-CM | POA: Diagnosis not present

## 2022-05-04 DIAGNOSIS — I10 Essential (primary) hypertension: Secondary | ICD-10-CM | POA: Diagnosis not present

## 2022-05-04 DIAGNOSIS — R531 Weakness: Secondary | ICD-10-CM | POA: Diagnosis not present

## 2022-05-04 DIAGNOSIS — R058 Other specified cough: Secondary | ICD-10-CM | POA: Diagnosis not present

## 2022-05-04 DIAGNOSIS — Z86711 Personal history of pulmonary embolism: Secondary | ICD-10-CM | POA: Diagnosis not present

## 2022-05-04 DIAGNOSIS — C799 Secondary malignant neoplasm of unspecified site: Secondary | ICD-10-CM

## 2022-05-04 DIAGNOSIS — C20 Malignant neoplasm of rectum: Secondary | ICD-10-CM | POA: Diagnosis not present

## 2022-05-04 DIAGNOSIS — Z7952 Long term (current) use of systemic steroids: Secondary | ICD-10-CM | POA: Diagnosis not present

## 2022-05-04 DIAGNOSIS — Z5112 Encounter for antineoplastic immunotherapy: Secondary | ICD-10-CM | POA: Diagnosis not present

## 2022-05-04 DIAGNOSIS — C181 Malignant neoplasm of appendix: Secondary | ICD-10-CM

## 2022-05-04 DIAGNOSIS — J439 Emphysema, unspecified: Secondary | ICD-10-CM | POA: Diagnosis not present

## 2022-05-04 DIAGNOSIS — C786 Secondary malignant neoplasm of retroperitoneum and peritoneum: Secondary | ICD-10-CM | POA: Diagnosis not present

## 2022-05-04 DIAGNOSIS — Z5111 Encounter for antineoplastic chemotherapy: Secondary | ICD-10-CM | POA: Diagnosis not present

## 2022-05-04 LAB — CBC WITH DIFFERENTIAL (CANCER CENTER ONLY)
Abs Immature Granulocytes: 0.01 10*3/uL (ref 0.00–0.07)
Basophils Absolute: 0.1 10*3/uL (ref 0.0–0.1)
Basophils Relative: 1 %
Eosinophils Absolute: 0.1 10*3/uL (ref 0.0–0.5)
Eosinophils Relative: 2 %
HCT: 37.5 % (ref 36.0–46.0)
Hemoglobin: 12.8 g/dL (ref 12.0–15.0)
Immature Granulocytes: 0 %
Lymphocytes Relative: 21 %
Lymphs Abs: 1.3 10*3/uL (ref 0.7–4.0)
MCH: 36 pg — ABNORMAL HIGH (ref 26.0–34.0)
MCHC: 34.1 g/dL (ref 30.0–36.0)
MCV: 105.3 fL — ABNORMAL HIGH (ref 80.0–100.0)
Monocytes Absolute: 0.6 10*3/uL (ref 0.1–1.0)
Monocytes Relative: 10 %
Neutro Abs: 4.1 10*3/uL (ref 1.7–7.7)
Neutrophils Relative %: 66 %
Platelet Count: 278 10*3/uL (ref 150–400)
RBC: 3.56 MIL/uL — ABNORMAL LOW (ref 3.87–5.11)
RDW: 14.6 % (ref 11.5–15.5)
WBC Count: 6.1 10*3/uL (ref 4.0–10.5)
nRBC: 0 % (ref 0.0–0.2)

## 2022-05-04 LAB — CMP (CANCER CENTER ONLY)
ALT: 13 U/L (ref 0–44)
AST: 26 U/L (ref 15–41)
Albumin: 3.8 g/dL (ref 3.5–5.0)
Alkaline Phosphatase: 121 U/L (ref 38–126)
Anion gap: 7 (ref 5–15)
BUN: 17 mg/dL (ref 8–23)
CO2: 26 mmol/L (ref 22–32)
Calcium: 9.4 mg/dL (ref 8.9–10.3)
Chloride: 104 mmol/L (ref 98–111)
Creatinine: 1.06 mg/dL — ABNORMAL HIGH (ref 0.44–1.00)
GFR, Estimated: 57 mL/min — ABNORMAL LOW (ref >60)
Glucose, Bld: 99 mg/dL (ref 70–99)
Potassium: 3.9 mmol/L (ref 3.5–5.1)
Sodium: 137 mmol/L (ref 135–145)
Total Bilirubin: 0.5 mg/dL (ref 0.3–1.2)
Total Protein: 7.1 g/dL (ref 6.5–8.1)

## 2022-05-04 LAB — TOTAL PROTEIN, URINE DIPSTICK: Protein, ur: NEGATIVE mg/dL

## 2022-05-04 LAB — CEA (IN HOUSE-CHCC): CEA (CHCC-In House): 8.26 ng/mL — ABNORMAL HIGH (ref 0.00–5.00)

## 2022-05-04 MED ORDER — SODIUM CHLORIDE 0.9% FLUSH
10.0000 mL | Freq: Once | INTRAVENOUS | Status: AC
  Administered 2022-05-04: 10 mL

## 2022-05-04 MED ORDER — SODIUM CHLORIDE 0.9% FLUSH
10.0000 mL | INTRAVENOUS | Status: DC | PRN
  Administered 2022-05-04: 10 mL

## 2022-05-04 MED ORDER — OXALIPLATIN CHEMO INJECTION 100 MG/20ML
50.0000 mg/m2 | Freq: Once | INTRAVENOUS | Status: AC
  Administered 2022-05-04: 80 mg via INTRAVENOUS
  Filled 2022-05-04: qty 16

## 2022-05-04 MED ORDER — DEXTROSE 5 % IV SOLN: Freq: Once | INTRAVENOUS | Status: AC

## 2022-05-04 MED ORDER — SODIUM CHLORIDE 0.9 % IV SOLN
10.0000 mg | Freq: Once | INTRAVENOUS | Status: AC
  Administered 2022-05-04: 10 mg via INTRAVENOUS
  Filled 2022-05-04: qty 10
  Filled 2022-05-04: qty 1

## 2022-05-04 MED ORDER — PALONOSETRON HCL INJECTION 0.25 MG/5ML
0.2500 mg | Freq: Once | INTRAVENOUS | Status: AC
  Administered 2022-05-04: 0.25 mg via INTRAVENOUS
  Filled 2022-05-04: qty 5

## 2022-05-04 MED ORDER — SODIUM CHLORIDE 0.9 % IV SOLN
400.0000 mg | Freq: Once | INTRAVENOUS | Status: AC
  Administered 2022-05-04: 400 mg via INTRAVENOUS
  Filled 2022-05-04: qty 16

## 2022-05-04 MED ORDER — HEPARIN SOD (PORK) LOCK FLUSH 100 UNIT/ML IV SOLN
500.0000 [IU] | Freq: Once | INTRAVENOUS | Status: AC | PRN
  Administered 2022-05-04: 500 [IU]

## 2022-05-04 MED ORDER — SODIUM CHLORIDE 0.9 % IV SOLN: Freq: Once | INTRAVENOUS | Status: AC

## 2022-05-04 NOTE — Patient Instructions (Signed)
[facility]  Discharge Instructions: Thank you for choosing [facility] to provide your oncology and hematology care.   If you have a lab appointment with the [facility], please go directly to the [facility] and check in at the registration area.   Wear comfortable clothing and clothing appropriate for easy access to any Portacath or PICC line.   We strive to give you quality time with your provider. You may need to reschedule your appointment if you arrive late (15 or more minutes).  Arriving late affects you and other patients whose appointments are after yours.  Also, if you miss three or more appointments without notifying the office, you may be dismissed from the clinic at the provider's discretion.      For prescription refill requests, have your pharmacy contact our office and allow 72 hours for refills to be completed.    Today you received the following chemotherapy and/or immunotherapy agents: bevacizumab/oxaliplatin.      To help prevent nausea and vomiting after your treatment, we encourage you to take your nausea medication as directed.  BELOW ARE SYMPTOMS THAT SHOULD BE REPORTED IMMEDIATELY: *FEVER GREATER THAN 100.4 F (38 C) OR HIGHER *CHILLS OR SWEATING *NAUSEA AND VOMITING THAT IS NOT CONTROLLED WITH YOUR NAUSEA MEDICATION *UNUSUAL SHORTNESS OF BREATH *UNUSUAL BRUISING OR BLEEDING *URINARY PROBLEMS (pain or burning when urinating, or frequent urination) *BOWEL PROBLEMS (unusual diarrhea, constipation, pain near the anus) TENDERNESS IN MOUTH AND THROAT WITH OR WITHOUT PRESENCE OF ULCERS (sore throat, sores in mouth, or a toothache) UNUSUAL RASH, SWELLING OR PAIN  UNUSUAL VAGINAL DISCHARGE OR ITCHING   Items with * indicate a potential emergency and should be followed up as soon as possible or go to the Emergency Department if any problems should occur.  Please show the CHEMOTHERAPY ALERT CARD or IMMUNOTHERAPY ALERT CARD  at check-in to the Emergency Department and triage nurse.  Should you have questions after your visit or need to cancel or reschedule your appointment, please contact [facility]  Dept: [phone]  and follow the prompts.  Office hours are 8:00 a.m. to 4:30 p.m. Monday - Friday. Please note that voicemails left after 4:00 p.m. may not be returned until the following business day.  We are closed weekends and major holidays. You have access to a nurse at all times for urgent questions. Please call the main number to the clinic Dept: [phone] and follow the prompts.   For any non-urgent questions, you may also contact your provider using MyChart. We now offer e-Visits for anyone 20 and older to request care online for non-urgent symptoms. For details visit [email or web address].   Also download the MyChart app! Go to the app store, search "MyChart", open the app, select [facility], and log in with your MyChart username and password.  Due to Covid, a mask is required upon entering the hospital/clinic. If you do not have a mask, one will be given to you upon arrival. For doctor visits, patients may have 1 support person aged 46 or older with them. For treatment visits, patients cannot have anyone with them due to current Covid guidelines and our immunocompromised population.

## 2022-05-04 NOTE — Progress Notes (Signed)
Bevacizumab dose basis: 7.5 mg/kg   Kennith Center, Pharm.D., CPP 05/04/2022'@12'$ :26 PM

## 2022-05-04 NOTE — Progress Notes (Addendum)
[place]   [phone] [phone]   Clinic Follow up Note   Patient Care Team: [name], DO as PCP - General (Family Medicine) [name], MD as Consulting Physician (Hematology) Icard, Octavio Graves, DO as Consulting Physician (Pulmonary Disease) Rosemead, P.A. [name], MD as Consulting Physician (General Surgery) [name], MD as Consulting Physician (General Surgery) [name], [name], MD as Consulting Physician (Gastroenterology)  Date of Service:  05/04/2022  CHIEF COMPLAINT: f/u of colon cancer  CURRENT THERAPY:  CAPOX -Xeloda started 02/01/21, currently 1500 mg BID 7 days on/7 days off -Bevacizumab q2-3 weeks added with C2 02/24/21             -Beva held 09/02/21-12/28/21 d/t PE -Oxaliplatin added with C4 03/24/21, currently q3weeks  ASSESSMENT & PLAN:  Tracy [name] is a 71 y.o. female with   1. [name], MMR normal, peritoneal metastasis in 01/2021 -diagnosed 02/2020, due to perforation she underwent emergent surgery. Path showed invasive adenocarcinoma with perforation, +3/15 LNs, and clear margins. Initial CT scan negative for distant metastasis. She was not a candidate for adjuvant chemo due to very slow recovery from surgery -Guardant Reveal ctDNA were negative (06/06/20, 07/12/20, and 09/10/20). -surveillance colonoscopy by Dr. Havery Moros 01/06/21 showed new adenocarcinoma at appendiceal orifice. Biopsy confirmed adenocarcinoma -01/27/21 PET showed tumor in appendiceal orifice is likely peritoneal metastasis growing into the cecum.  Her peritoneal implants are mainly in the pelvis, difficult to biopsy -She began single agent Xeloda with cycle 1 on 02/01/21.  Due to low performance status with cycle 2 was changed to 1 week on/1 week off, and beva was added. She tolerates this well -PS improved and low dose oxaliplatin was added with C4, now on Capeox/beva q3 weeks, with  Xeloda 1 week on/1 week off. -Beva was held for PE found on CT 08/2021, she is on xarelto. Resumed beva q3 weeks 12/29/21 -restaging CT CAP on 04/09/22 was stable, with no new or progressive findings.  -labs reviewed, overall stable, urine protein negative, adequate to proceed with beva and resume oxali today  -Follow-up in 3 weeks.   2. Productive Cough, Asthma -she takes trelegy, benadryl, flonase, and albuterol for her allergies/asthma. -she completed a cycle of doxycycline/prednisone in 03/2022 for a possible sinus infection following allergies. She initially noted improvement. -CT CAP on 04/09/22 was stable, no new nodules or masses. -improving, she notes some residual congestion today.   3. HTN -secondary to Beva -she has an intolerance listed for amlodipine. She started losartan 32m daily on 03/02/22.   4. Bilateral PE, found on restaging CT CAP 08/14/21  -no hypoxia, stable respiratory status. She began xarelto 08/15/21 -Beva was held, CT 12/25/21 was clear, Beva resumed 12/29/21   5. COVID-19 + 05/21/21 and respiratory infection -she received CAPOX/beva on 05/19/21, tested positive on 05/21/21 -treated with paxlovid and subsequently doxycycline, augment, and prednisone. Recovered well.   6. Comorbidities: Asthma, COPD, anxiety -on nebulizer and trelegy, has not needed rescue inhaler recently -f/up pulmonology and PCP.      PLAN: -proceed with Beva and oxali at same dose  -continue Xeloda at same dose; she started this morning -lab, flush, f/u, and oxali/beva in 3 and 6 weeks   No problem-specific Assessment & Plan notes found for this encounter.   SUMMARY OF ONCOLOGIC HISTORY: Oncology History Overview Note  Cancer Staging Rectal cancer ([facility] Staging form: Colon and Rectum, AJCC 8th Edition - Pathologic stage from 03/03/2020: Stage  IIIB (pT4a, pN1b, cM0) - Signed by [name], MD on 01/28/2021 Stage prefix: Initial diagnosis Histologic grading system: 4 grade system Histologic  grade (G): G2 Residual tumor (R): R0 - None    Rectal cancer ([place])  03/03/2020 - 04/05/2020 Hospital Admission   She was admitted to ED on 03/03/20 for abdominal pain and nausea. She had been having diarrhea for several weeks. During hospital stay she developed acute respiratory failure, AKI, RUE DVT from PICC line. Work up showed bowel perforation, small left liver mass and thickening of jejunum. She underwent emergent surgery on 03/03/20 for resection and colostomy placement. Her path showed invasive cancer, metastatic to 3/15 LNs. She had a NGT in placed but this was removed on POD 3. Post op her stoma became necrotic and septic. She had another bowel surgery on 03/12/20, which was NED with necrotic tissue.    03/03/2020 Imaging   CT AP 03/03/20  IMPRESSION: 1. Free intraperitoneal air consistent perforation. Most likely site of perforation is in the distal splenic flexure/proximal descending colon where there is a collection of air and gas measuring 6 centimeters. No obvious soft tissue mass identified in this region. At this site, there is abrupt transition of dilated, stool-filled colon to completely decompressed proximal descending colon. 2. Thickened, inflamed loops of jejunum are identified within the pelvis and are likely reactive. 3. Small hiatal hernia. 4. Benign-appearing 1.6 centimeter mass the LEFT hepatic lobe. Recommend comparison with prior studies if available. 5.  Emphysema (ICD10-J43.9). 6.  Aortic Atherosclerosis (ICD10-I70.0). 7. Bilateral renal scarring.   03/03/2020 Surgery   low anterior resection end colostomy by Dr Marcello Moores    03/03/2020 Initial Biopsy   FINAL MICROSCOPIC DIAGNOSIS: 03/03/20 A. RECTOSIGMOID COLON, LOW ANTERIOR RESECTION:  - Invasive colonic adenocarcinoma, 3.5 cm.  - Tumor invades the visceral peritoneum.  - Margins of resection are not involved.  - Metastatic carcinoma in (3) of (15) lymph nodes.  - See oncology table.   B. ADDITIONAL SIGMOID  COLON, RESECTION:  - Colonic tissue, negative for carcinoma.  ADDENDUM:  Mismatch Repair Protein (IHC)   SUMMARY INTERPRETATION: NORMAL  There is preserved expression of the major MMR proteins. There is a very  low probability that microsatellite instability (MSI) is present.  However, certain clinically significant MMR protein mutations may result  in preservation of nuclear expression. It is recommended that the  preservation of protein expression be correlated with molecular based  MSI testing.   IHC EXPRESSION RESULTS  TEST           RESULT  MLH1:          Preserved nuclear expression  MSH2:          Preserved nuclear expression  MSH6:          Preserved nuclear expression  PMS2:          Preserved nuclear expression   03/03/2020 Cancer Staging   Staging form: Colon and Rectum, AJCC 8th Edition - Pathologic stage from 03/03/2020: Stage IIIB (pT4a, pN1b, cM0) - Signed by [name], MD on 01/28/2021 Stage prefix: Initial diagnosis Histologic grading system: 4 grade system Histologic grade (G): G2 Residual tumor (R): R0 - None    03/08/2020 Imaging   CT AP 03/08/20 IMPRESSION: 1. Interval midline laparotomy with distal colon resection and diverting left lower quadrant colostomy. 2. Diffuse small bowel dilatation with gas fluid levels most consistent with postoperative ileus. 3. Trace ascites within the abdomen and pelvis. No fluid collection or abscess at this time.  Surgical drain within the lower pelvis. 4. Indeterminate 1.4 cm subcapsular liver hypodensity. In light of newly diagnosed rectal cancer, metastatic disease cannot be excluded. PET CT may be useful for further evaluation. 5. Interval development of trace bilateral pleural effusions and diffuse body wall edema.   03/12/2020 Surgery   EXPLORATORY LAPAROTOMY WITH BOWEL RESECTION AND COLOSTOMY by Dr Hassell Done 03/12/20  FINAL MICROSCOPIC DIAGNOSIS: 03/12/20  A. COLON, SPLENIC FLEXURE, RESECTION:  - Segment of colon (37  cm) with perforation and associated inflammation  - Multiple mucosal ulcers with necrotizing inflammation  - No evidence of malignancy    03/12/2020 Imaging   CT AP WO contrast 03/12/20  IMPRESSION: 1. Exam is limited by lack of intravenous contrast, motion, and artifact from the patient's arms adjacent to the torso. Since 03/08/2020, there has been development of relatively large pockets of intraperitoneal free air. While intraperitoneal gas would not be unexpected on postoperative day 9, this gas is new since an intervening study of 03/08/2020 and given the relatively large volume certainly raises concern for bowel perforation. No source for the intraperitoneal free air is evident on this study. There is a surgical drain in the pelvis, but there is no free gas around the drain itself to suggest that it represents the source. 2. New circumferential wall thickening in the splenic flexure, descending colon and sigmoid colon leading into the end colostomy. Infection/inflammation would be a consideration. Ischemia cannot be excluded. 3. Relatively small volume intraperitoneal free fluid. High attenuation small fluid collections in the left upper abdomen may reflect hemorrhage, infection or residua from prior perforation. 4. Interval progression of diffuse body wall edema. 5. Residual contrast material in the renal parenchyma from prior imaging, compatible with renal dysfunction.    03/19/2020 Imaging   CT CAP 03/19/20  IMPRESSION: 1. 5.5 x 3.2 cm fluid collection is noted along the greater curvature of the proximal stomach. 2. Surgical drain is again noted in the pelvis with tip in left lower quadrant. 3. Interval development of crescent-shaped fluid collection measuring 16.4 x 3.3 cm in the epigastric region and left upper quadrant of the abdomen which may extend into the left lower quadrant. Potentially this may represent abscess or developing abscess. Multiple other smaller fluid  collections are noted which may represent small abscesses. 4. 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 5. Mild amount of free fluid is noted in the posterior pelvis. 6. Moderate anasarca is noted. Aortic Atherosclerosis (ICD10-I70.0).   04/12/2020 Imaging   CT AP 04/12/20  IMPRESSION: 1. Fluid collections within the LEFT abdomen have significantly decreased compared to previous CT exams, now nearly completely resolved. 2. Percutaneous drainage catheter with tip coiled posterior to the LEFT kidney, stable positioning compared to the previous study. The more anterior catheter has been removed. 3. Small amount of free fluid persists within the abdomen and pelvis. 4. Trace bibasilar pleural effusions. 5. Anasarca. 6. While reviewing today's study, comparing with a chest/abdomen/pelvis CT from earlier same month, there is question of thrombus in the LEFT internal jugular vein. Recommend ultrasound of the LEFT IJ to exclude DVT. This recommendation discussed with patient's hospitalist, Dr. Owens Shark, on 04/12/2020 at 4:20 p.m.   Emphysema (ICD10-J43.9).   04/22/2020 Imaging   CT AP 04/22/20  IMPRESSION: 1. No recurrent intra-abdominal abscess status post interval removal of left retroperitoneal percutaneous drain. Stable small amount of free pelvic fluid. 2. Enlarging dependent bilateral pleural effusions, now moderate in volume. Associated atelectasis at both lung bases. 3. Progressive anasarca with generalized  edema throughout the subcutaneous fat. 4. Stable small subcapsular fluid collection along the anterior aspect of the left hepatic lobe. 5. Aortic Atherosclerosis (ICD10-I70.0).   05/23/2020 Initial Diagnosis   Rectal cancer ([place])   01/14/2021 Imaging   CT CAP IMPRESSION: -5.0 cm soft tissue mass along the posterior aspect of the cecum at the base of the appendix, favored to reflect a peritoneal/serosal implant, corresponding to the patient's known adenocarcinoma.  This is new from the prior. -Suspected additional pelvic implants along the uterus and right adnexa, poorly visualized, new/progressive from the prior. -Additional pericapsular lesion along the lateral spleen is mildly progressive, suspicious for additional peritoneal implant. -No evidence of metastatic disease in the chest.   01/27/2021 PET scan   IMPRESSION:  1. Extensive hypermetabolic peritoneal metastasis, as detailed  above.  2. No evidence of hypermetabolic supradiaphragmatic disease.  3. Diffuse low-level thyroid hypermetabolism can be seen in the  setting of thyroiditis. Consider correlation with thyroid function  labs.  4. Aortic atherosclerosis (ICD10-I70.0) and emphysema (ICD10-J43.9).    02/01/2021 -  Chemotherapy    First-line chemo Xeloda 1500 mg twice daily for day 1-14, every 21 days, started on 02/01/2021. Reduced to 1 week on/ 1 week off and added bevacizumab q2weeks from C2 on 02/24/21.  ----Given good tolerance, I changed to CAPOX and bevacizumab q2weeks from C4 on 03/24/21 with Xeloda 156m in the AM and 10037min the PM 1 week on/1 week off.     05/08/2021 Imaging   PET  IMPRESSION: 1. There is been interval development of several FDG avid pulmonary nodules within both lungs. Additionally, there is new FDG avid low left paratracheal lymph node. Thoracic metastasis cannot be excluded. 2. Interval improvement in multifocal FDG avid peritoneal metastasis as detailed above.     08/14/2021 Imaging   CT CAP  IMPRESSION: Resolution of previously seen small bilateral pulmonary nodules since previous study.   Stable 8 mm mediastinal lymph node in the AP window.   No evidence of metastatic disease within the abdomen or pelvis.   Nonocclusive pulmonary embolism in bilateral central lower lobe pulmonary arteries, which appears subacute in age.   12/25/2021 Imaging   EXAM: CT CHEST, ABDOMEN, AND PELVIS WITH CONTRAST  IMPRESSION: 1. Minimal, bandlike residua of  previously seen FDG avid pulmonary nodules, consistent with treated metastases. No new pulmonary nodules. 2. Unchanged post treatment appearance of the cecal base, with soft tissue thickening about the cecal base and adjacent pelvis, previously FDG avid and in keeping with known appendiceal orifice malignancy. 3. Unchanged central fluid attenuation lesions about the spleen, left iliopsoas, and left pelvic sidewall, previously FDG avid, consistent with treated peritoneal implants. 4. No evidence of new metastatic disease in the chest, abdomen, or pelvis 5. Status post rectosigmoid colon resection with left lower quadrant end colostomy.   Aortic Atherosclerosis (ICD10-I70.0).   [place] 01/06/2021 Procedure   (surveillance colonoscopy for h/o rectal cancer) Impression:  - Preparation of the colon was fair, lavage performed with mostly adequate views. - Polypoid lesion obliterating appendiceal orifice. Biopsied to evaluate for malignancy. - The examined portion of the ileum was normal. - Two large polyps in the cecum, not removed today pending path at appendiceal orifice, bowel prep. If removed endoscopically in the future would favor EMR to be done at the hospital. - One 5 mm polyp at the ileocecal valve, removed with a cold snare. Resected and retrieved. - One 3 mm polyp in the transverse colon, removed with a  cold snare. Resected and retrieved. - Parastomal hernia making initial entry to the distal colon challenging. - The examination was otherwise normal.   01/06/2021 Initial Biopsy   Diagnosis 1. Colon, biopsy, appendiceal oraface - ADENOCARCINOMA. 2. Colon, polyp(s), ileocecal valve, transverse, x2 - TUBULAR ADENOMA, NEGATIVE FOR HIGH GRADE DYSPLASIA (X1). - COLONIC MUCOSA WITH UNDERLYING LYMPHOID AGGREGATE, NEGATIVE FOR DYSPLASIA (X1).  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 This is new from the prior. -Suspected additional pelvic implants along the uterus and right adnexa, poorly visualized, new/progressive from the prior. -Additional pericapsular lesion along the lateral spleen is mildly progressive, suspicious for additional peritoneal implant. -No evidence of metastatic disease in the chest.   08/14/2021 Imaging   CT CAP IMPRESSION: Resolution of previously seen small bilateral pulmonary nodules since previous study. Stable 8 mm mediastinal lymph node in the AP window. No evidence of metastatic disease within the abdomen or pelvis. Nonocclusive pulmonary embolism in bilateral central lower lobe pulmonary arteries, which appears subacute in age.  Aortic Atherosclerosis (ICD10-I70.0) and Emphysema (ICD10-J43.9).     08/14/2021 Imaging   CT CAP  IMPRESSION: Resolution of previously seen small bilateral pulmonary nodules since previous study.   Stable 8 mm mediastinal lymph node in the AP window.   No evidence of metastatic disease within the abdomen or pelvis.   Nonocclusive pulmonary embolism in bilateral central lower lobe pulmonary arteries, which appears subacute in age.   12/25/2021 Imaging   EXAM: CT CHEST, ABDOMEN, AND PELVIS WITH CONTRAST  IMPRESSION: 1. Minimal, bandlike residua of previously seen FDG avid pulmonary nodules, consistent with treated metastases. No new pulmonary nodules. 2. Unchanged post treatment appearance of the cecal base, with soft tissue thickening about the cecal base and adjacent pelvis, previously FDG avid and in keeping with  known appendiceal orifice malignancy. 3. Unchanged central fluid attenuation lesions about the spleen, left iliopsoas, and left pelvic sidewall, previously FDG avid, consistent with treated peritoneal implants. 4. No evidence of new metastatic disease in the chest, abdomen, or pelvis 5. Status post rectosigmoid colon resection with left lower quadrant end colostomy.   Aortic Atherosclerosis (ICD10-I70.0).      INTERVAL HISTORY:  Tracy [name] is here for a follow up of colon cancer. She was last seen by me on 04/13/22. She presents to the clinic alone. She reports she is feeling better today. She reports some mild residual congestion.   All other systems were reviewed with the patient and are negative.  MEDICAL HISTORY:  Past Medical History:  Diagnosis Date   Acute deep vein thrombosis (DVT) of brachial vein of right upper extremity (HCC)    Acute on chronic respiratory failure with hypoxia (HCC)    Asthma    Cataract    CHICKENPOX, HX OF 01/06/2011   Qualifier: Diagnosis of  By: Charlett Blake MD, Stacey     Colon cancer [facility])    COPD (chronic obstructive pulmonary disease) ([place]) 2011   FeV1 31% predicted FeV1/FVX 47 %   COVID-19 05/2021   Essential hypertension 05/23/2020  Hemorrhoid    Open right radial fracture 2020   Osteopenia    Perforated sigmoid colon ([place]) 03/03/2020   Pleural effusion    Pneumonia 2021   Postoperative intra-abdominal abscess 2021   Rectal cancer (HCC)    Seasonal allergies    takes Claritin daily prn   Shock circulatory ([place])    Tracheostomy status ([place])    Vitamin D deficiency    takes Vit d every 14 days    SURGICAL HISTORY: Past Surgical History:  Procedure Laterality Date   AUGMENTATION MAMMAPLASTY     saline   EXAMINATION UNDER ANESTHESIA  10/14/2012   Procedure: EXAM UNDER ANESTHESIA;  Surgeon: Gayland Curry, MD,FACS;  Location: [facility];  Service: General;  Laterality: N/A;  rectal exam under anesthesia excisional hemorrhoidectomy  hemorrhoidal banding x two   EXAMINATION UNDER ANESTHESIA  02/03/2013   excision hemorrhoidal tissue   FOOT SURGERY     left bunionectomy   HEMORRHOIDECTOMY WITH HEMORRHOID BANDING  10/14/2012   Procedure: HEMORRHOIDECTOMY WITH HEMORRHOID BANDING;  Surgeon: Gayland Curry, MD,FACS;  Location: [place];  Service: General;  Laterality: N/A;   IR IMAGING GUIDED PORT INSERTION  02/03/2021   IR THORACENTESIS ASP PLEURAL SPACE W/IMG GUIDE  04/23/2020   LAPAROTOMY N/A 03/03/2020   Procedure: low anterior resection end colostomy;  Surgeon: [name], MD;  Location: [facility];  Service: General;  Laterality: N/A;   LAPAROTOMY N/A 03/12/2020   Procedure: EXPLORATORY LAPAROTOMY WITH BOWEL RESECTION AND COLOSTOMY;  Surgeon: [name], MD;  Location: [facility];  Service: General;  Laterality: N/A;   OPEN REDUCTION INTERNAL FIXATION (ORIF) DISTAL RADIAL FRACTURE Right 11/01/2018   Procedure: OPEN REDUCTION INTERNAL FIXATION (ORIF) DISTAL RADIAL FRACTURE;  Surgeon: [name], MD;  Location: [place];  Service: Orthopedics;  Laterality: Right;   TONSILLECTOMY  1960   recurrent otitis media   US ECHOCARDIOGRAPHY  02/2020    poor windows, normal LV function, severely dilated RV with moderately reduced function, RV volume and pressure overload, mildly dilated RA    I have reviewed the social history and family history with the patient and they are unchanged from previous note.  ALLERGIES:  is allergic to amlodipine and codeine.  MEDICATIONS:  Current Outpatient Medications  Medication Sig Dispense Refill   albuterol (PROVENTIL) (2.5 MG/3ML) 0.083% nebulizer solution Take 3 mLs (2.5 mg total) by nebulization every 4 (four) hours as needed for wheezing or shortness of breath. DX: J44.9 360 mL 5   albuterol (VENTOLIN HFA) 108 (90 Base) MCG/ACT inhaler Inhale 2 puffs into the lungs every 6 (six) hours as needed for wheezing or shortness of breath. 8 g 5   ALPRAZolam (XANAX) 0.25 MG tablet Take  1 tablet (0.25 mg total) by mouth at bedtime as needed for anxiety. 30 tablet 0   capecitabine (XELODA) 500 MG tablet Take for 7 days on, 7 days off, repeat every 14 days. 84 tablet 1   docusate sodium (COLACE) 50 MG capsule Take 50 mg by mouth daily as needed for mild constipation.     doxycycline (VIBRA-TABS) 100 MG tablet Take 1 tablet (100 mg total) by mouth 2 (two) times daily. 14 tablet 0   fluticasone (FLONASE) 50 MCG/ACT nasal spray Place 2 sprays into both nostrils daily. 16 mL 5   Fluticasone-Umeclidin-Vilant (TRELEGY ELLIPTA) 100-62.5-25 MCG/ACT AEPB Inhale 1 puff into the lungs daily. 60 each 3   Fluticasone-Umeclidin-Vilant (TRELEGY ELLIPTA) 100-62.5-25 MCG/INH AEPB Inhale 1 puff into the lungs daily. 60 each  5   lidocaine-prilocaine (EMLA) cream Apply 1 application. topically as needed. 30 g 2   loratadine (CLARITIN) 10 MG tablet Take 10 mg by mouth daily.     losartan (COZAAR) 25 MG tablet TAKE 1 TABLET (25 MG TOTAL) BY MOUTH DAILY. 30 tablet 1   ondansetron (ZOFRAN) 8 MG tablet Take 1 tablet (8 mg total) by mouth every 8 (eight) hours as needed for nausea or vomiting. 20 tablet 2   prochlorperazine (COMPAZINE) 10 MG tablet Take 1 tablet (10 mg total) by mouth every 6 (six) hours as needed for nausea or vomiting. 30 tablet 2   XARELTO 20 MG TABS tablet TAKE 1 TABLET BY MOUTH DAILY WITH SUPPER 30 tablet 0   No current facility-administered medications for this visit.    PHYSICAL EXAMINATION: ECOG PERFORMANCE STATUS: 2 - Symptomatic, <50% confined to bed  Vitals:   05/04/22 1039  BP: (!) 148/69  Pulse: 87  Resp: 19  Temp: 97.7 F (36.5 C)  SpO2: 100%   Wt Readings from Last 3 Encounters:  05/04/22 115 lb 12.8 oz (52.5 kg)  03/23/22 116 lb 6 oz (52.8 kg)  03/02/22 119 lb 9.6 oz (54.3 kg)     GENERAL:alert, no distress and comfortable SKIN: skin color normal, no rashes or significant lesions EYES: normal, Conjunctiva are pink and non-injected, sclera clear  NEURO:  alert & oriented x 3 with fluent speech  LABORATORY DATA:  I have reviewed the data as listed    Latest Ref Rng & Units 05/04/2022   10:21 AM 04/13/2022   10:32 AM 03/23/2022   10:38 AM  CBC  WBC 4.0 - 10.5 K/uL 6.1   6.8   4.2    Hemoglobin 12.0 - 15.0 g/dL 12.8   12.0   12.5    Hematocrit 36.0 - 46.0 % 37.5   35.3   36.1    Platelets 150 - 400 K/uL 278   229   268          Latest Ref Rng & Units 05/04/2022   10:21 AM 04/13/2022   10:32 AM 03/23/2022   10:38 AM  CMP  Glucose 70 - 99 mg/dL 99   100   133    BUN 8 - 23 mg/dL 17   15   21     Creatinine 0.44 - 1.00 mg/dL 1.06   1.02   1.05    Sodium 135 - 145 mmol/L 137   132   135    Potassium 3.5 - 5.1 mmol/L 3.9   4.1   3.6    Chloride 98 - 111 mmol/L 104   101   101    CO2 22 - 32 mmol/L 26   23   24     Calcium 8.9 - 10.3 mg/dL 9.4   8.8   9.6    Total Protein 6.5 - 8.1 g/dL 7.1   7.0   7.3    Total Bilirubin 0.3 - 1.2 mg/dL 0.5   0.6   0.7    Alkaline Phos 38 - 126 U/L 121   115   120    AST 15 - 41 U/L 26   17   21     ALT 0 - 44 U/L 13   10   11         RADIOGRAPHIC STUDIES: I have personally reviewed the radiological images as listed and agreed with the findings in the report. No results found.    No orders of the defined  types were placed in this encounter.  All questions were answered. The patient knows to call the clinic with any problems, questions or concerns. No barriers to learning was detected.      [name], MD 05/04/2022   I, Wilburn Mylar, am acting as scribe for [name], MD.   I have reviewed the above documentation for accuracy and completeness, and I agree with the above.

## 2022-05-11 ENCOUNTER — Other Ambulatory Visit: Payer: Self-pay | Admitting: Hematology

## 2022-05-15 ENCOUNTER — Telehealth: Payer: Self-pay

## 2022-05-15 NOTE — Telephone Encounter (Signed)
Most patients recovers quickly from cataract surgery I suggest she hold xeloda today, and resume 3 days after surgery

## 2022-05-15 NOTE — Telephone Encounter (Signed)
Pt called stating that she is having Cataract surgery on Monday and would like to know does she need to not take her Xeloda because her new cycle will start during this time.  Informed pt that this RN will consult with the covering provider while Dr. Burr Medico is out of the office.  Sent message to Dr. Alvy Bimler.

## 2022-05-15 NOTE — Telephone Encounter (Signed)
Spoke with pt via telephone regarding her Capecitabine.  Informed pt that Dr. Alvy Bimler would like for the pt to hold the Capecitabine today and resume the Capecitabine 3 days after her Cataract surgery.  Pt verbalized understanding and had no further questions at this time.

## 2022-05-21 ENCOUNTER — Other Ambulatory Visit: Payer: Self-pay | Admitting: Hematology

## 2022-05-21 DIAGNOSIS — H2512 Age-related nuclear cataract, left eye: Secondary | ICD-10-CM | POA: Diagnosis not present

## 2022-05-22 MED FILL — Dexamethasone Sodium Phosphate Inj 100 MG/10ML: INTRAMUSCULAR | Qty: 1 | Status: AC

## 2022-05-24 NOTE — Progress Notes (Unsigned)
[place]   [phone] Fax:(336) [phone]   Clinic Follow up Note   Patient Care Team: [name], DO as PCP - General (Family Medicine) [name], MD as Consulting Physician (Hematology) Icard, Octavio Graves, DO as Consulting Physician (Pulmonary Disease) [place], P.A. [name], MD as Consulting Physician (General Surgery) [name], MD as Consulting Physician (General Surgery) [name], [name], MD as Consulting Physician (Gastroenterology) 05/25/2022  CHIEF COMPLAINT: Follow up colon cancer   SUMMARY OF ONCOLOGIC HISTORY: Oncology History Overview Note  Cancer Staging Rectal cancer [facility]) Staging form: Colon and Rectum, AJCC 8th Edition - Pathologic stage from 03/03/2020: Stage IIIB (pT4a, pN1b, cM0) - Signed by [name], MD on 01/28/2021 Stage prefix: Initial diagnosis Histologic grading system: 4 grade system Histologic grade (G): G2 Residual tumor (R): R0 - None    Rectal cancer ([place])  03/03/2020 - 04/05/2020 Hospital Admission   She was admitted to ED on 03/03/20 for abdominal pain and nausea. She had been having diarrhea for several weeks. During hospital stay she developed acute respiratory failure, AKI, RUE DVT from PICC line. Work up showed bowel perforation, small left liver mass and thickening of jejunum. She underwent emergent surgery on 03/03/20 for resection and colostomy placement. Her path showed invasive cancer, metastatic to 3/15 LNs. She had a NGT in placed but this was removed on POD 3. Post op her stoma became necrotic and septic. She had another bowel surgery on 03/12/20, which was NED with necrotic tissue.    03/03/2020 Imaging   CT AP 03/03/20  IMPRESSION: 1. Free intraperitoneal air consistent perforation. Most likely site of perforation is in the distal splenic flexure/proximal descending colon where there is a collection of air and gas measuring 6 centimeters. No obvious soft tissue mass  identified in this region. At this site, there is abrupt transition of dilated, stool-filled colon to completely decompressed proximal descending colon. 2. Thickened, inflamed loops of jejunum are identified within the pelvis and are likely reactive. 3. Small hiatal hernia. 4. Benign-appearing 1.6 centimeter mass the LEFT hepatic lobe. Recommend comparison with prior studies if available. 5.  Emphysema (ICD10-J43.9). 6.  Aortic Atherosclerosis (ICD10-I70.0). 7. Bilateral renal scarring.   03/03/2020 Surgery   low anterior resection end colostomy by Dr Marcello Moores    03/03/2020 Initial Biopsy   FINAL MICROSCOPIC DIAGNOSIS: 03/03/20 A. RECTOSIGMOID COLON, LOW ANTERIOR RESECTION:  - Invasive colonic adenocarcinoma, 3.5 cm.  - Tumor invades the visceral peritoneum.  - Margins of resection are not involved.  - Metastatic carcinoma in (3) of (15) lymph nodes.  - See oncology table.   B. ADDITIONAL SIGMOID COLON, RESECTION:  - Colonic tissue, negative for carcinoma.  ADDENDUM:  Mismatch Repair Protein (IHC)   SUMMARY INTERPRETATION: NORMAL  There is preserved expression of the major MMR proteins. There is a very  low probability that microsatellite instability (MSI) is present.  However, certain clinically significant MMR protein mutations may result  in preservation of nuclear expression. It is recommended that the  preservation of protein expression be correlated with molecular based  MSI testing.   IHC EXPRESSION RESULTS  TEST           RESULT  MLH1:          Preserved nuclear expression  MSH2:          Preserved nuclear expression  MSH6:          Preserved nuclear expression  PMS2:  Preserved nuclear expression   03/03/2020 Cancer Staging   Staging form: Colon and Rectum, AJCC 8th Edition - Pathologic stage from 03/03/2020: Stage IIIB (pT4a, pN1b, cM0) - Signed by [name], MD on 01/28/2021 Stage prefix: Initial diagnosis Histologic grading system: 4 grade  system Histologic grade (G): G2 Residual tumor (R): R0 - None   03/08/2020 Imaging   CT AP 03/08/20 IMPRESSION: 1. Interval midline laparotomy with distal colon resection and diverting left lower quadrant colostomy. 2. Diffuse small bowel dilatation with gas fluid levels most consistent with postoperative ileus. 3. Trace ascites within the abdomen and pelvis. No fluid collection or abscess at this time. Surgical drain within the lower pelvis. 4. Indeterminate 1.4 cm subcapsular liver hypodensity. In light of newly diagnosed rectal cancer, metastatic disease cannot be excluded. PET CT may be useful for further evaluation. 5. Interval development of trace bilateral pleural effusions and diffuse body wall edema.   03/12/2020 Surgery   EXPLORATORY LAPAROTOMY WITH BOWEL RESECTION AND COLOSTOMY by Dr Hassell Done 03/12/20  FINAL MICROSCOPIC DIAGNOSIS: 03/12/20  A. COLON, SPLENIC FLEXURE, RESECTION:  - Segment of colon (37 cm) with perforation and associated inflammation  - Multiple mucosal ulcers with necrotizing inflammation  - No evidence of malignancy    03/12/2020 Imaging   CT AP WO contrast 03/12/20  IMPRESSION: 1. Exam is limited by lack of intravenous contrast, motion, and artifact from the patient's arms adjacent to the torso. Since 03/08/2020, there has been development of relatively large pockets of intraperitoneal free air. While intraperitoneal gas would not be unexpected on postoperative day 9, this gas is new since an intervening study of 03/08/2020 and given the relatively large volume certainly raises concern for bowel perforation. No source for the intraperitoneal free air is evident on this study. There is a surgical drain in the pelvis, but there is no free gas around the drain itself to suggest that it represents the source. 2. New circumferential wall thickening in the splenic flexure, descending colon and sigmoid colon leading into the end  colostomy. Infection/inflammation would be a consideration. Ischemia cannot be excluded. 3. Relatively small volume intraperitoneal free fluid. High attenuation small fluid collections in the left upper abdomen may reflect hemorrhage, infection or residua from prior perforation. 4. Interval progression of diffuse body wall edema. 5. Residual contrast material in the renal parenchyma from prior imaging, compatible with renal dysfunction.    03/19/2020 Imaging   CT CAP 03/19/20  IMPRESSION: 1. 5.5 x 3.2 cm fluid collection is noted along the greater curvature of the proximal stomach. 2. Surgical drain is again noted in the pelvis with tip in left lower quadrant. 3. Interval development of crescent-shaped fluid collection measuring 16.4 x 3.3 cm in the epigastric region and left upper quadrant of the abdomen which may extend into the left lower quadrant. Potentially this may represent abscess or developing abscess. Multiple other smaller fluid collections are noted which may represent small abscesses. 4. 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 5. Mild amount of free fluid is noted in the posterior pelvis. 6. Moderate anasarca is noted. Aortic Atherosclerosis (ICD10-I70.0).   04/12/2020 Imaging   CT AP 04/12/20  IMPRESSION: 1. Fluid collections within the LEFT abdomen have significantly decreased compared to previous CT exams, now nearly completely resolved. 2. Percutaneous drainage catheter with tip coiled posterior to the LEFT kidney, stable positioning compared to the previous study. The more anterior catheter has been removed. 3. Small amount of free fluid persists within the abdomen and pelvis. 4.  Trace bibasilar pleural effusions. 5. Anasarca. 6. While reviewing today's study, comparing with a chest/abdomen/pelvis CT from earlier same month, there is question of thrombus in the LEFT internal jugular vein. Recommend ultrasound of the LEFT IJ to exclude DVT. This  recommendation discussed with patient's hospitalist, Dr. Owens Shark, on 04/12/2020 at 4:20 p.m.   Emphysema (ICD10-J43.9).   04/22/2020 Imaging   CT AP 04/22/20  IMPRESSION: 1. No recurrent intra-abdominal abscess status post interval removal of left retroperitoneal percutaneous drain. Stable small amount of free pelvic fluid. 2. Enlarging dependent bilateral pleural effusions, now moderate in volume. Associated atelectasis at both lung bases. 3. Progressive anasarca with generalized edema throughout the subcutaneous fat. 4. Stable small subcapsular fluid collection along the anterior aspect of the left hepatic lobe. 5. Aortic Atherosclerosis (ICD10-I70.0).   05/23/2020 Initial Diagnosis   Rectal cancer ([place])   01/14/2021 Imaging   CT CAP IMPRESSION: -5.0 cm soft tissue mass along the posterior aspect of the cecum at the base of the appendix, favored to reflect a peritoneal/serosal implant, corresponding to the patient's known adenocarcinoma. This is new from the prior. -Suspected additional pelvic implants along the uterus and right adnexa, poorly visualized, new/progressive from the prior. -Additional pericapsular lesion along the lateral spleen is mildly progressive, suspicious for additional peritoneal implant. -No evidence of metastatic disease in the chest.   01/27/2021 PET scan   IMPRESSION:  1. Extensive hypermetabolic peritoneal metastasis, as detailed  above.  2. No evidence of hypermetabolic supradiaphragmatic disease.  3. Diffuse low-level thyroid hypermetabolism can be seen in the  setting of thyroiditis. Consider correlation with thyroid function  labs.  4. Aortic atherosclerosis (ICD10-I70.0) and emphysema (ICD10-J43.9).    02/01/2021 -  Chemotherapy    First-line chemo Xeloda 1500 mg twice daily for day 1-14, every 21 days, started on 02/01/2021. Reduced to 1 week on/ 1 week off and added bevacizumab q2weeks from C2 on 02/24/21.  ----Given good tolerance, I changed to  CAPOX and bevacizumab q2weeks from C4 on 03/24/21 with Xeloda $RemoveBef'1500mg'vMXLZbzbbr$  in the AM and $Remo'1000mg'LMBty$  in the PM 1 week on/1 week off.     05/08/2021 Imaging   PET  IMPRESSION: 1. There is been interval development of several FDG avid pulmonary nodules within both lungs. Additionally, there is new FDG avid low left paratracheal lymph node. Thoracic metastasis cannot be excluded. 2. Interval improvement in multifocal FDG avid peritoneal metastasis as detailed above.     08/14/2021 Imaging   CT CAP  IMPRESSION: Resolution of previously seen small bilateral pulmonary nodules since previous study.   Stable 8 mm mediastinal lymph node in the AP window.   No evidence of metastatic disease within the abdomen or pelvis.   Nonocclusive pulmonary embolism in bilateral central lower lobe pulmonary arteries, which appears subacute in age.   12/25/2021 Imaging   EXAM: CT CHEST, ABDOMEN, AND PELVIS WITH CONTRAST  IMPRESSION: 1. Minimal, bandlike residua of previously seen FDG avid pulmonary nodules, consistent with treated metastases. No new pulmonary nodules. 2. Unchanged post treatment appearance of the cecal base, with soft tissue thickening about the cecal base and adjacent pelvis, previously FDG avid and in keeping with known appendiceal orifice malignancy. 3. Unchanged central fluid attenuation lesions about the spleen, left iliopsoas, and left pelvic sidewall, previously FDG avid, consistent with treated peritoneal implants. 4. No evidence of new metastatic disease in the chest, abdomen, or pelvis 5. Status post rectosigmoid colon resection with left lower quadrant end colostomy.   Aortic Atherosclerosis (ICD10-I70.0).   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 - Polypoid lesion obliterating appendiceal orifice. Biopsied to  evaluate for malignancy. - The examined portion of the ileum was normal. - Two large polyps in the cecum, not removed today pending path at appendiceal orifice, bowel prep. If removed endoscopically in the future would favor EMR to be done at the hospital. - One 5 mm polyp at the ileocecal valve, removed with a cold snare. Resected and retrieved. - One 3 mm polyp in the transverse colon, removed with a cold snare. Resected and retrieved. - Parastomal hernia making initial entry to the distal colon challenging. - The examination was otherwise normal.   01/06/2021 Initial Biopsy   Diagnosis 1. Colon, biopsy, appendiceal oraface - ADENOCARCINOMA. 2. Colon, polyp(s), ileocecal valve, transverse, x2 - TUBULAR ADENOMA, NEGATIVE FOR HIGH GRADE DYSPLASIA (X1). - COLONIC MUCOSA WITH UNDERLYING LYMPHOID AGGREGATE, NEGATIVE FOR DYSPLASIA (X1).  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 This is new from the prior. -Suspected additional pelvic implants along the uterus and right adnexa, poorly visualized, new/progressive from the prior. -Additional pericapsular lesion along the lateral spleen is mildly progressive, suspicious for additional peritoneal implant. -No evidence of metastatic disease in the chest.   08/14/2021 Imaging   CT CAP IMPRESSION: Resolution of previously seen small bilateral pulmonary nodules since previous study. Stable 8 mm mediastinal lymph node in the AP window. No evidence of metastatic disease within the abdomen or  pelvis. Nonocclusive pulmonary embolism in bilateral central lower lobe pulmonary arteries, which appears subacute in age.  Aortic Atherosclerosis (ICD10-I70.0) and Emphysema (ICD10-J43.9).     08/14/2021 Imaging   CT CAP  IMPRESSION: Resolution of previously seen small bilateral pulmonary nodules since previous study.   Stable 8 mm mediastinal lymph node in the AP window.   No evidence of metastatic disease within the abdomen or pelvis.   Nonocclusive pulmonary embolism in bilateral central lower lobe pulmonary arteries, which appears subacute in age.   12/25/2021 Imaging   EXAM: CT CHEST, ABDOMEN, AND PELVIS WITH CONTRAST  IMPRESSION: 1. Minimal, bandlike residua of previously seen FDG avid pulmonary nodules, consistent with treated metastases. No new pulmonary nodules. 2. Unchanged post treatment appearance of the cecal base, with soft tissue thickening about the cecal base and adjacent pelvis, previously FDG avid and in keeping with known appendiceal orifice malignancy. 3. Unchanged central fluid attenuation lesions about the spleen, left iliopsoas, and left pelvic sidewall, previously FDG avid, consistent with treated peritoneal implants. 4. No evidence of new metastatic disease in the chest, abdomen, or pelvis 5. Status post rectosigmoid colon resection with left lower quadrant end colostomy.   Aortic Atherosclerosis (ICD10-I70.0).     CURRENT THERAPY: CAPOX (Xeloda started 02/01/21 currently 1500 mg BID 7 days on/7 days off); oxaliplatin added with C4 03/24/21, currently q3 weeks; bevacizumab q2-3 weeks added with C2 on 02/24/21   INTERVAL HISTORY: Ms. Dhaliwal returns for follow up and treatment  as scheduled. Last seen by Dr. Burr Medico 05/04/22 and resumed oxali/beva. She continues xeloda 1500 mg twice daily for 1 week on and 1 week off, she began the current cycle today.  She delayed the start due to a recent left eye cataract surgery on 6/8, she is recovered well.  She has  very mild cold sensitivity after oxaliplatin, no functional difficulties.  She cannot use a lot of lotion because it affects the seal of her colostomy.  Bowel movements are normal, empties her bag 2-3 times with soft or loose stool.  Denies nausea/vomiting.  She is eating and drinking well.  Energy is at baseline.  Denies new or worse abdominal pain.  She continues to have nasal drainage and productive cough with green sputum.  This continues to improve but has not yet resolved.  She uses her respiratory meds including albuterol and nebulizer and OTC Benadryl.  Denies fever, chills, chest pain, or worsening dyspnea.   MEDICAL HISTORY:  Past Medical History:  Diagnosis Date   Acute deep vein thrombosis (DVT) of brachial vein of right upper extremity (HCC)    Acute on chronic respiratory failure with hypoxia (HCC)    Asthma    Cataract    CHICKENPOX, HX OF 01/06/2011   Qualifier: Diagnosis of  By: Charlett Blake MD, Stacey     Colon cancer [facility])    COPD (chronic obstructive pulmonary disease) ([place]) 2011   FeV1 31% predicted FeV1/FVX 47 %   COVID-19 05/2021   Essential hypertension 05/23/2020   Hemorrhoid    Open right radial fracture 2020   Osteopenia    Perforated sigmoid colon ([place]) 03/03/2020   Pleural effusion    Pneumonia 2021   Postoperative intra-abdominal abscess 2021   Rectal cancer (HCC)    Seasonal allergies    takes Claritin daily prn   Shock circulatory ([place])    Tracheostomy status ([place])    Vitamin D deficiency    takes Vit d every 14 days    SURGICAL HISTORY: Past Surgical History:  Procedure Laterality Date   AUGMENTATION MAMMAPLASTY     saline   EXAMINATION UNDER ANESTHESIA  10/14/2012   Procedure: EXAM UNDER ANESTHESIA;  Surgeon: Gayland Curry, MD,FACS;  Location: [place];  Service: General;  Laterality: N/A;  rectal exam under anesthesia excisional hemorrhoidectomy hemorrhoidal banding x two   EXAMINATION UNDER ANESTHESIA  02/03/2013   excision hemorrhoidal tissue   FOOT  SURGERY     left bunionectomy   HEMORRHOIDECTOMY WITH HEMORRHOID BANDING  10/14/2012   Procedure: HEMORRHOIDECTOMY WITH HEMORRHOID BANDING;  Surgeon: Gayland Curry, MD,FACS;  Location: [place];  Service: General;  Laterality: N/A;   IR IMAGING GUIDED PORT INSERTION  02/03/2021   IR THORACENTESIS ASP PLEURAL SPACE W/IMG GUIDE  04/23/2020   LAPAROTOMY N/A 03/03/2020   Procedure: low anterior resection end colostomy;  Surgeon: [name], MD;  Location: [facility];  Service: General;  Laterality: N/A;   LAPAROTOMY N/A 03/12/2020   Procedure: EXPLORATORY LAPAROTOMY WITH BOWEL RESECTION AND COLOSTOMY;  Surgeon: [name], MD;  Location: [facility];  Service: General;  Laterality: N/A;   OPEN REDUCTION INTERNAL FIXATION (ORIF) DISTAL RADIAL FRACTURE Right 11/01/2018   Procedure: OPEN REDUCTION INTERNAL FIXATION (ORIF) DISTAL RADIAL FRACTURE;  Surgeon: [name], MD;  Location: [place];  Service: Orthopedics;  Laterality: Right;   TONSILLECTOMY  1960   recurrent otitis media   US ECHOCARDIOGRAPHY  02/2020    poor windows, normal LV function, severely dilated RV with moderately  reduced function, RV volume and pressure overload, mildly dilated RA    I have reviewed the social history and family history with the patient and they are unchanged from previous note.  ALLERGIES:  is allergic to amlodipine and codeine.  MEDICATIONS:  Current Outpatient Medications  Medication Sig Dispense Refill   albuterol (PROVENTIL) (2.5 MG/3ML) 0.083% nebulizer solution Take 3 mLs (2.5 mg total) by nebulization every 4 (four) hours as needed for wheezing or shortness of breath. DX: J44.9 360 mL 5   albuterol (VENTOLIN HFA) 108 (90 Base) MCG/ACT inhaler Inhale 2 puffs into the lungs every 6 (six) hours as needed for wheezing or shortness of breath. 8 g 5   ALPRAZolam (XANAX) 0.25 MG tablet Take 1 tablet (0.25 mg total) by mouth at bedtime as needed for anxiety. 30 tablet 0   capecitabine (XELODA) 500  MG tablet Take for 7 days on, 7 days off, repeat every 14 days. 84 tablet 1   docusate sodium (COLACE) 50 MG capsule Take 50 mg by mouth daily as needed for mild constipation.     doxycycline (VIBRA-TABS) 100 MG tablet Take 1 tablet (100 mg total) by mouth 2 (two) times daily. 14 tablet 0   fluticasone (FLONASE) 50 MCG/ACT nasal spray Place 2 sprays into both nostrils daily. 16 mL 5   Fluticasone-Umeclidin-Vilant (TRELEGY ELLIPTA) 100-62.5-25 MCG/ACT AEPB Inhale 1 puff into the lungs daily. 60 each 3   Fluticasone-Umeclidin-Vilant (TRELEGY ELLIPTA) 100-62.5-25 MCG/INH AEPB Inhale 1 puff into the lungs daily. 60 each 5   lidocaine-prilocaine (EMLA) cream Apply 1 application. topically as needed. 30 g 2   loratadine (CLARITIN) 10 MG tablet Take 10 mg by mouth daily.     losartan (COZAAR) 25 MG tablet TAKE 1 TABLET (25 MG TOTAL) BY MOUTH DAILY. 30 tablet 1   ondansetron (ZOFRAN) 8 MG tablet Take 1 tablet (8 mg total) by mouth every 8 (eight) hours as needed for nausea or vomiting. 20 tablet 2   prochlorperazine (COMPAZINE) 10 MG tablet Take 1 tablet (10 mg total) by mouth every 6 (six) hours as needed for nausea or vomiting. 30 tablet 2   XARELTO 20 MG TABS tablet TAKE 1 TABLET BY MOUTH DAILY WITH SUPPER 30 tablet 0   No current facility-administered medications for this visit.   Facility-Administered Medications Ordered in Other Visits  Medication Dose Route Frequency Provider Last Rate Last Admin   heparin lock flush 100 unit/mL  500 Units Intracatheter Once PRN [name], MD       oxaliplatin (ELOXATIN) 80 mg in dextrose 5 % 500 mL chemo infusion  50 mg/m2 (Treatment Plan Recorded) Intravenous Once [name], MD 258 mL/hr at 05/25/22 1125 80 mg at 05/25/22 1125   sodium chloride flush (NS) 0.9 % injection 10 mL  10 mL Intracatheter PRN [name], MD        PHYSICAL EXAMINATION: ECOG PERFORMANCE STATUS: 1 - Symptomatic but completely ambulatory  Vitals:   05/25/22 0942  BP: 136/82  Pulse:  92  Resp: 19  Temp: (!) 97.5 F (36.4 C)  SpO2: 93%   Filed Weights   05/25/22 0942  Weight: 116 lb 8 oz (52.8 kg)    GENERAL:alert, no distress and comfortable SKIN: No rash.  Palms dry without erythema, no cracks EYES: sclera clear LUNGS: Decreased breath sounds, no wheezing or adventitious sounds; normal breathing effort HEART: regular rate & rhythm,  no lower extremity edema ABDOMEN:abdomen soft, non-tender and normal bowel sounds.  Colostomy noted NEURO: alert &  oriented x 3 with fluent speech, no focal motor deficits PAC without erythema  LABORATORY DATA:  I have reviewed the data as listed    Latest Ref Rng & Units 05/25/2022    9:21 AM 05/04/2022   10:21 AM 04/13/2022   10:32 AM  CBC  WBC 4.0 - 10.5 K/uL 7.1  6.1  6.8   Hemoglobin 12.0 - 15.0 g/dL 12.8  12.8  12.0   Hematocrit 36.0 - 46.0 % 38.0  37.5  35.3   Platelets 150 - 400 K/uL 295  278  229         Latest Ref Rng & Units 05/25/2022    9:21 AM 05/04/2022   10:21 AM 04/13/2022   10:32 AM  CMP  Glucose 70 - 99 mg/dL 100  99  100   BUN 8 - 23 mg/dL $Remove'13  17  15   'ddxLATN$ Creatinine 0.44 - 1.00 mg/dL 0.97  1.06  1.02   Sodium 135 - 145 mmol/L 136  137  132   Potassium 3.5 - 5.1 mmol/L 4.2  3.9  4.1   Chloride 98 - 111 mmol/L 102  104  101   CO2 22 - 32 mmol/L $RemoveB'27  26  23   'IEMePKgT$ Calcium 8.9 - 10.3 mg/dL 9.7  9.4  8.8   Total Protein 6.5 - 8.1 g/dL 7.0  7.1  7.0   Total Bilirubin 0.3 - 1.2 mg/dL 0.5  0.5  0.6   Alkaline Phos 38 - 126 U/L 111  121  115   AST 15 - 41 U/L $Remo'19  26  17   'eMnFq$ ALT 0 - 44 U/L $Remo'10  13  10       'RVwAy$ RADIOGRAPHIC STUDIES: I have personally reviewed the radiological images as listed and agreed with the findings in the report. No results found.   ASSESSMENT & PLAN: 71 yo female   1. [name], MMR normal, peritoneal metastasis in 01/2021 -diagnosed 02/2020, due to perforation underwent emergent surgery. Path showed invasive adenocarcinoma with perforation, +3/15 LNs, and  clear margins. Initial CT scan negative for distant metastasis. -due to bowel perforation and + LNs she has very high risk for recurrence, unfortunately she was not a candidate for adjuvant chemo due to very slow recovery from surgery -Guardant Reveal ctDNA were all negative x3 (06/06/20, 07/12/20, and 09/10/20). -surveillance CT 08/27/20 showed a small density adjacent to the spleen that had decreased, no other evidence of recurrent or metastatic disease -first surveillance colonoscopy by Dr. Havery Moros 01/06/21 showed new adenocarcinoma at appendiceal orifice and other tubular adenomas throughout the colon.  Biopsy confirmed adenocarcinoma -01/27/2021 PET showed tumor in appendiceal orifice is likely peritoneal metastasis growing into the cecum.  Her peritoneal implants are mainly in the pelvis, difficult to biopsy -She began single agent Xeloda with cycle 1 on 2/19.  Due to low performance status with cycle 2 was changed to 1 week on/1 week off, and beva was added. She tolerates this well -PS improved and low dose oxaliplatin was added with C4, now on Capeox/beva q2 weeks, with Xeloda 1 week on/1 week off. -she is responding well. She is now receiving oxali q3 weeks. 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  -Beva was held for PE found on CT 08/2021, she is on xarelto. Resumed beva q3 weeks 12/29/21 -Restaging CT CAP 04/09/2022 with stable, no new or progressive disease.  CEA fluctuates but overall stable -Ms. Rackers appears stable.  She  continues Xeloda 1500 mg twice daily for 1 week on/1 week off and oxaliplatin/Beva every 3 weeks.  She tolerates treatment well without significant side effects.  She is able to recover and function well.  There is no clinical evidence of disease progression. -Labs adequate to proceed with another cycle today as planned, she began Xeloda this morning -Likely restage in July.  Follow-up in 3 weeks with next cycle   2. 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 COVID-19 + 05/21/21 and respiratory infection -she received CAPOX/beva on 05/19/21, tested positive on 05/21/21 -treated with paxlovid and subsequently doxycycline, augment, and prednisone  -CXR 06/23/21 showed only emphysema -She had recurrent cough in 03/2022 completed additional antibiotics -Cough is improving but not resolved, will repeat chest x-ray.  Continue supportive care, can add Mucinex -I will send a message to Dr. Valeta Harms to see if he can move up her August follow-up   4. H/o provoked RUE DVT 04/04/20 s/p 3 months AC (Eliquis)   5. Comorbidities: Asthma, COPD, anxiety -on nebulizer and trelegy -f/up pulmonology and PCP   Plan: -Labs reviewed, follow-up on the pending CEA from today -Proceed with CapeOx and bevacizumab today as planned, continue the current Xeloda cycle she began today -Right cataract surgery 6/22 -Chest x-ray this week, will send message to pulmonologist Dr. Valeta Harms -Supportive care for cough, can add Mucinex -Follow-up and next cycle in 3 weeks -Likely restage in [place] This Encounter  Procedures   DG Chest 2 View    Standing Status:   Future    Standing Expiration Date:   05/25/2023    Order Specific Question:   Reason for Exam (SYMPTOM  OR DIAGNOSIS REQUIRED)    Answer:   persistent cough, h/o asthma, covid 2022, metastatic colon cancer    Order Specific Question:   Preferred imaging location?    Answer:   [facility]   All questions were answered. The patient knows to call the clinic with any problems, questions or concerns. No barriers to learning was detected. I spent 20 minutes counseling the patient face to face. The total time spent in the appointment was 30 minutes and more than 50% was on counseling and review of test results      Alla Feeling, NP 05/25/22

## 2022-05-25 ENCOUNTER — Inpatient Hospital Stay: Payer: Medicare HMO | Attending: Hematology

## 2022-05-25 ENCOUNTER — Inpatient Hospital Stay: Payer: Medicare HMO

## 2022-05-25 ENCOUNTER — Other Ambulatory Visit: Payer: Self-pay

## 2022-05-25 ENCOUNTER — Encounter: Payer: Self-pay | Admitting: Nurse Practitioner

## 2022-05-25 ENCOUNTER — Inpatient Hospital Stay: Payer: Medicare HMO | Admitting: Nurse Practitioner

## 2022-05-25 VITALS — BP 136/82 | HR 92 | Temp 97.5°F | Resp 19 | Ht 62.0 in | Wt 116.5 lb

## 2022-05-25 DIAGNOSIS — C20 Malignant neoplasm of rectum: Secondary | ICD-10-CM

## 2022-05-25 DIAGNOSIS — Z933 Colostomy status: Secondary | ICD-10-CM | POA: Insufficient documentation

## 2022-05-25 DIAGNOSIS — J449 Chronic obstructive pulmonary disease, unspecified: Secondary | ICD-10-CM | POA: Insufficient documentation

## 2022-05-25 DIAGNOSIS — R053 Chronic cough: Secondary | ICD-10-CM | POA: Diagnosis not present

## 2022-05-25 DIAGNOSIS — C181 Malignant neoplasm of appendix: Secondary | ICD-10-CM

## 2022-05-25 DIAGNOSIS — I10 Essential (primary) hypertension: Secondary | ICD-10-CM | POA: Diagnosis not present

## 2022-05-25 DIAGNOSIS — Z5112 Encounter for antineoplastic immunotherapy: Secondary | ICD-10-CM | POA: Diagnosis not present

## 2022-05-25 DIAGNOSIS — F419 Anxiety disorder, unspecified: Secondary | ICD-10-CM | POA: Diagnosis not present

## 2022-05-25 DIAGNOSIS — Z86711 Personal history of pulmonary embolism: Secondary | ICD-10-CM | POA: Diagnosis not present

## 2022-05-25 DIAGNOSIS — R059 Cough, unspecified: Secondary | ICD-10-CM | POA: Diagnosis not present

## 2022-05-25 DIAGNOSIS — Z7901 Long term (current) use of anticoagulants: Secondary | ICD-10-CM | POA: Diagnosis not present

## 2022-05-25 DIAGNOSIS — Z86718 Personal history of other venous thrombosis and embolism: Secondary | ICD-10-CM | POA: Insufficient documentation

## 2022-05-25 DIAGNOSIS — C786 Secondary malignant neoplasm of retroperitoneum and peritoneum: Secondary | ICD-10-CM | POA: Diagnosis not present

## 2022-05-25 DIAGNOSIS — C799 Secondary malignant neoplasm of unspecified site: Secondary | ICD-10-CM | POA: Diagnosis not present

## 2022-05-25 DIAGNOSIS — Z5111 Encounter for antineoplastic chemotherapy: Secondary | ICD-10-CM | POA: Diagnosis not present

## 2022-05-25 DIAGNOSIS — Z8616 Personal history of COVID-19: Secondary | ICD-10-CM | POA: Diagnosis not present

## 2022-05-25 LAB — CBC WITH DIFFERENTIAL (CANCER CENTER ONLY)
Abs Immature Granulocytes: 0.01 10*3/uL (ref 0.00–0.07)
Basophils Absolute: 0.1 10*3/uL (ref 0.0–0.1)
Basophils Relative: 1 %
Eosinophils Absolute: 0.2 10*3/uL (ref 0.0–0.5)
Eosinophils Relative: 2 %
HCT: 38 % (ref 36.0–46.0)
Hemoglobin: 12.8 g/dL (ref 12.0–15.0)
Immature Granulocytes: 0 %
Lymphocytes Relative: 28 %
Lymphs Abs: 2 10*3/uL (ref 0.7–4.0)
MCH: 35.4 pg — ABNORMAL HIGH (ref 26.0–34.0)
MCHC: 33.7 g/dL (ref 30.0–36.0)
MCV: 105 fL — ABNORMAL HIGH (ref 80.0–100.0)
Monocytes Absolute: 0.8 10*3/uL (ref 0.1–1.0)
Monocytes Relative: 11 %
Neutro Abs: 4.1 10*3/uL (ref 1.7–7.7)
Neutrophils Relative %: 58 %
Platelet Count: 295 10*3/uL (ref 150–400)
RBC: 3.62 MIL/uL — ABNORMAL LOW (ref 3.87–5.11)
RDW: 14.2 % (ref 11.5–15.5)
WBC Count: 7.1 10*3/uL (ref 4.0–10.5)
nRBC: 0 % (ref 0.0–0.2)

## 2022-05-25 LAB — CMP (CANCER CENTER ONLY)
ALT: 10 U/L (ref 0–44)
AST: 19 U/L (ref 15–41)
Albumin: 3.9 g/dL (ref 3.5–5.0)
Alkaline Phosphatase: 111 U/L (ref 38–126)
Anion gap: 7 (ref 5–15)
BUN: 13 mg/dL (ref 8–23)
CO2: 27 mmol/L (ref 22–32)
Calcium: 9.7 mg/dL (ref 8.9–10.3)
Chloride: 102 mmol/L (ref 98–111)
Creatinine: 0.97 mg/dL (ref 0.44–1.00)
GFR, Estimated: >60 mL/min (ref >60)
Glucose, Bld: 100 mg/dL — ABNORMAL HIGH (ref 70–99)
Potassium: 4.2 mmol/L (ref 3.5–5.1)
Sodium: 136 mmol/L (ref 135–145)
Total Bilirubin: 0.5 mg/dL (ref 0.3–1.2)
Total Protein: 7 g/dL (ref 6.5–8.1)

## 2022-05-25 LAB — CEA (IN HOUSE-CHCC): CEA (CHCC-In House): 7.19 ng/mL — ABNORMAL HIGH (ref 0.00–5.00)

## 2022-05-25 LAB — TOTAL PROTEIN, URINE DIPSTICK: Protein, ur: NEGATIVE mg/dL

## 2022-05-25 MED ORDER — SODIUM CHLORIDE 0.9 % IV SOLN: Freq: Once | INTRAVENOUS | Status: AC

## 2022-05-25 MED ORDER — SODIUM CHLORIDE 0.9% FLUSH
10.0000 mL | Freq: Once | INTRAVENOUS | Status: AC
  Administered 2022-05-25: 10 mL

## 2022-05-25 MED ORDER — SODIUM CHLORIDE 0.9 % IV SOLN
10.0000 mg | Freq: Once | INTRAVENOUS | Status: AC
  Administered 2022-05-25: 10 mg via INTRAVENOUS
  Filled 2022-05-25: qty 10

## 2022-05-25 MED ORDER — HEPARIN SOD (PORK) LOCK FLUSH 100 UNIT/ML IV SOLN
500.0000 [IU] | Freq: Once | INTRAVENOUS | Status: AC | PRN
  Administered 2022-05-25: 500 [IU]

## 2022-05-25 MED ORDER — SODIUM CHLORIDE 0.9 % IV SOLN
400.0000 mg | Freq: Once | INTRAVENOUS | Status: AC
  Administered 2022-05-25: 400 mg via INTRAVENOUS
  Filled 2022-05-25: qty 16

## 2022-05-25 MED ORDER — DEXTROSE 5 % IV SOLN: Freq: Once | INTRAVENOUS | Status: AC

## 2022-05-25 MED ORDER — SODIUM CHLORIDE 0.9% FLUSH
10.0000 mL | INTRAVENOUS | Status: DC | PRN
  Administered 2022-05-25: 10 mL

## 2022-05-25 MED ORDER — PALONOSETRON HCL INJECTION 0.25 MG/5ML
0.2500 mg | Freq: Once | INTRAVENOUS | Status: AC
  Administered 2022-05-25: 0.25 mg via INTRAVENOUS
  Filled 2022-05-25: qty 5

## 2022-05-25 MED ORDER — OXALIPLATIN CHEMO INJECTION 100 MG/20ML
50.0000 mg/m2 | Freq: Once | INTRAVENOUS | Status: AC
  Administered 2022-05-25: 80 mg via INTRAVENOUS
  Filled 2022-05-25: qty 16

## 2022-05-25 NOTE — Patient Instructions (Signed)
[facility] ONCOL[facility]   Discharge Instructions: Thank you for choosing [facility] to provide your oncology and hematology care.   If you have a lab appointment with the [facility], please go directly to the [facility] and check in at the registration area.   Wear comfortable clothing and clothing appropriate for easy access to any Portacath or PICC line.   We strive to give you quality time with your provider. You may need to reschedule your appointment if you arrive late (15 or more minutes).  Arriving late affects you and other patients whose appointments are after yours.  Also, if you miss three or more appointments without notifying the office, you may be dismissed from the clinic at the provider's discretion.      For prescription refill requests, have your pharmacy contact our office and allow 72 hours for refills to be completed.    Today you received the following chemotherapy and/or immunotherapy agents: oxaliplatin and bevacizumab-bvzr.      To help prevent nausea and vomiting after your treatment, we encourage you to take your nausea medication as directed.  BELOW ARE SYMPTOMS THAT SHOULD BE REPORTED IMMEDIATELY: *FEVER GREATER THAN 100.4 F (38 C) OR HIGHER *CHILLS OR SWEATING *NAUSEA AND VOMITING THAT IS NOT CONTROLLED WITH YOUR NAUSEA MEDICATION *UNUSUAL SHORTNESS OF BREATH *UNUSUAL BRUISING OR BLEEDING *URINARY PROBLEMS (pain or burning when urinating, or frequent urination) *BOWEL PROBLEMS (unusual diarrhea, constipation, pain near the anus) TENDERNESS IN MOUTH AND THROAT WITH OR WITHOUT PRESENCE OF ULCERS (sore throat, sores in mouth, or a toothache) UNUSUAL RASH, SWELLING OR PAIN  UNUSUAL VAGINAL DISCHARGE OR ITCHING   Items with * indicate a potential emergency and should be followed up as soon as possible or go to the Emergency Department if any problems should occur.  Please show the CHEMOTHERAPY ALERT CARD or IMMUNOTHERAPY  ALERT CARD at check-in to the Emergency Department and triage nurse.  Should you have questions after your visit or need to cancel or reschedule your appointment, please contact [facility]  Dept: [phone]  and follow the prompts.  Office hours are 8:00 a.m. to 4:30 p.m. Monday - Friday. Please note that voicemails left after 4:00 p.m. may not be returned until the following business day.  We are closed weekends and major holidays. You have access to a nurse at all times for urgent questions. Please call the main number to the clinic Dept: [phone] and follow the prompts.   For any non-urgent questions, you may also contact your provider using MyChart. We now offer e-Visits for anyone 39 and older to request care online for non-urgent symptoms. For details visit [email or web address].   Also download the MyChart app! Go to the app store, search "MyChart", open the app, select [facility], and log in with your MyChart username and password.  Due to Covid, a mask is required upon entering the hospital/clinic. If you do not have a mask, one will be given to you upon arrival. For doctor visits, patients may have 1 support person aged 44 or older with them. For treatment visits, patients cannot have anyone with them due to current Covid guidelines and our immunocompromised population.   Oxaliplatin Injection What is this medication? OXALIPLATIN (ox AL i PLA tin) is a chemotherapy drug. It targets fast dividing cells, like cancer cells, and causes these cells to die. This medicine is used to treat cancers of the colon and rectum, and many other cancers.  This medicine may be used for other purposes; ask your health care provider or pharmacist if you have questions. COMMON BRAND NAME(S): Eloxatin What should I tell my care team before I take this medication? They need to know if you have any of these conditions: heart disease history of irregular heartbeat liver  disease low blood counts, like white cells, platelets, or red blood cells lung or breathing disease, like asthma take medicines that treat or prevent blood clots tingling of the fingers or toes, or other nerve disorder an unusual or allergic reaction to oxaliplatin, other chemotherapy, other medicines, foods, dyes, or preservatives pregnant or trying to get pregnant breast-feeding How should I use this medication? This drug is given as an infusion into a vein. It is administered in a hospital or clinic by a specially trained health care professional. Talk to your pediatrician regarding the use of this medicine in children. Special care may be needed. Overdosage: If you think you have taken too much of this medicine contact a poison control center or emergency room at once. NOTE: This medicine is only for you. Do not share this medicine with others. What if I miss a dose? It is important not to miss a dose. Call your doctor or health care professional if you are unable to keep an appointment. What may interact with this medication? Do not take this medicine with any of the following medications: cisapride dronedarone pimozide thioridazine This medicine may also interact with the following medications: aspirin and aspirin-like medicines certain medicines that treat or prevent blood clots like warfarin, apixaban, dabigatran, and rivaroxaban cisplatin cyclosporine diuretics medicines for infection like acyclovir, adefovir, amphotericin B, bacitracin, cidofovir, foscarnet, ganciclovir, gentamicin, pentamidine, vancomycin NSAIDs, medicines for pain and inflammation, like ibuprofen or naproxen other medicines that prolong the QT interval (an abnormal heart rhythm) pamidronate zoledronic acid This list may not describe all possible interactions. Give your health care provider a list of all the medicines, herbs, non-prescription drugs, or dietary supplements you use. Also tell them if you  smoke, drink alcohol, or use illegal drugs. Some items may interact with your medicine. What should I watch for while using this medication? Your condition will be monitored carefully while you are receiving this medicine. You may need blood work done while you are taking this medicine. This medicine may make you feel generally unwell. This is not uncommon as chemotherapy can affect healthy cells as well as cancer cells. Report any side effects. Continue your course of treatment even though you feel ill unless your healthcare professional tells you to stop. This medicine can make you more sensitive to cold. Do not drink cold drinks or use ice. Cover exposed skin before coming in contact with cold temperatures or cold objects. When out in cold weather wear warm clothing and cover your mouth and nose to warm the air that goes into your lungs. Tell your doctor if you get sensitive to the cold. Do not become pregnant while taking this medicine or for 9 months after stopping it. Women should inform their health care professional if they wish to become pregnant or think they might be pregnant. Men should not father a child while taking this medicine and for 6 months after stopping it. There is potential for serious side effects to an unborn child. Talk to your health care professional for more information. Do not breast-feed a child while taking this medicine or for 3 months after stopping it. This medicine has caused ovarian failure in some women. This  medicine may make it more difficult to get pregnant. Talk to your health care professional if you are concerned about your fertility. This medicine has caused decreased sperm counts in some men. This may make it more difficult to father a child. Talk to your health care professional if you are concerned about your fertility. This medicine may increase your risk of getting an infection. Call your health care professional for advice if you get a fever, chills, or  sore throat, or other symptoms of a cold or flu. Do not treat yourself. Try to avoid being around people who are sick. Avoid taking medicines that contain aspirin, acetaminophen, ibuprofen, naproxen, or ketoprofen unless instructed by your health care professional. These medicines may hide a fever. Be careful brushing or flossing your teeth or using a toothpick because you may get an infection or bleed more easily. If you have any dental work done, tell your dentist you are receiving this medicine. What side effects may I notice from receiving this medication? Side effects that you should report to your doctor or health care professional as soon as possible: allergic reactions like skin rash, itching or hives, swelling of the face, lips, or tongue breathing problems cough low blood counts - this medicine may decrease the number of white blood cells, red blood cells, and platelets. You may be at increased risk for infections and bleeding nausea, vomiting pain, redness, or irritation at site where injected pain, tingling, numbness in the hands or feet signs and symptoms of bleeding such as bloody or black, tarry stools; red or dark brown urine; spitting up blood or brown material that looks like coffee grounds; red spots on the skin; unusual bruising or bleeding from the eyes, gums, or nose signs and symptoms of a dangerous change in heartbeat or heart rhythm like chest pain; dizziness; fast, irregular heartbeat; palpitations; feeling faint or lightheaded; falls signs and symptoms of infection like fever; chills; cough; sore throat; pain or trouble passing urine signs and symptoms of liver injury like dark yellow or brown urine; general ill feeling or flu-like symptoms; light-colored stools; loss of appetite; nausea; right upper belly pain; unusually weak or tired; yellowing of the eyes or skin signs and symptoms of low red blood cells or anemia such as unusually weak or tired; feeling faint or  lightheaded; falls signs and symptoms of muscle injury like dark urine; trouble passing urine or change in the amount of urine; unusually weak or tired; muscle pain; back pain Side effects that usually do not require medical attention (report to your doctor or health care professional if they continue or are bothersome): changes in taste diarrhea gas hair loss loss of appetite mouth sores This list may not describe all possible side effects. Call your doctor for medical advice about side effects. You may report side effects to FDA at 1-800-FDA-1088. Where should I keep my medication? This drug is given in a hospital or clinic and will not be stored at home. NOTE: This sheet is a summary. It may not cover all possible information. If you have questions about this medicine, talk to your doctor, pharmacist, or health care provider.  2023 Elsevier/Gold Standard (2021-10-31 00:00:00)  Bevacizumab injection What is this medication? BEVACIZUMAB (be va SIZ yoo mab) is a monoclonal antibody. It is used to treat many types of cancer. This medicine may be used for other purposes; ask your health care provider or pharmacist if you have questions. COMMON BRAND NAME(S): Alymsys, Avastin, MVASI, Zirabev What should I  tell my care team before I take this medication? They need to know if you have any of these conditions: diabetes heart disease high blood pressure history of coughing up blood prior anthracycline chemotherapy (e.g., doxorubicin, daunorubicin, epirubicin) recent or ongoing radiation therapy recent or planning to have surgery stroke an unusual or allergic reaction to bevacizumab, hamster proteins, mouse proteins, other medicines, foods, dyes, or preservatives pregnant or trying to get pregnant breast-feeding How should I use this medication? This medicine is for infusion into a vein. It is given by a health care professional in a hospital or clinic setting. Talk to your pediatrician  regarding the use of this medicine in children. Special care may be needed. Overdosage: If you think you have taken too much of this medicine contact a poison control center or emergency room at once. NOTE: This medicine is only for you. Do not share this medicine with others. What if I miss a dose? It is important not to miss your dose. Call your doctor or health care professional if you are unable to keep an appointment. What may interact with this medication? Interactions are not expected. This list may not describe all possible interactions. Give your health care provider a list of all the medicines, herbs, non-prescription drugs, or dietary supplements you use. Also tell them if you smoke, drink alcohol, or use illegal drugs. Some items may interact with your medicine. What should I watch for while using this medication? Your condition will be monitored carefully while you are receiving this medicine. You will need important blood work and urine testing done while you are taking this medicine. This medicine may increase your risk to bruise or bleed. Call your doctor or health care professional if you notice any unusual bleeding. Before having surgery, talk to your health care provider to make sure it is ok. This drug can increase the risk of poor healing of your surgical site or wound. You will need to stop this drug for 28 days before surgery. After surgery, wait at least 28 days before restarting this drug. Make sure the surgical site or wound is healed enough before restarting this drug. Talk to your health care provider if questions. Do not become pregnant while taking this medicine or for 6 months after stopping it. Women should inform their doctor if they wish to become pregnant or think they might be pregnant. There is a potential for serious side effects to an unborn child. Talk to your health care professional or pharmacist for more information. Do not breast-feed an infant while taking  this medicine and for 6 months after the last dose. This medicine has caused ovarian failure in some women. This medicine may interfere with the ability to have a child. You should talk to your doctor or health care professional if you are concerned about your fertility. What side effects may I notice from receiving this medication? Side effects that you should report to your doctor or health care professional as soon as possible: allergic reactions like skin rash, itching or hives, swelling of the face, lips, or tongue chest pain or chest tightness chills coughing up blood high fever seizures severe constipation signs and symptoms of bleeding such as bloody or black, tarry stools; red or dark-brown urine; spitting up blood or brown material that looks like coffee grounds; red spots on the skin; unusual bruising or bleeding from the eye, gums, or nose signs and symptoms of a blood clot such as breathing problems; chest pain; severe, sudden headache;  pain, swelling, warmth in the leg signs and symptoms of a stroke like changes in vision; confusion; trouble speaking or understanding; severe headaches; sudden numbness or weakness of the face, arm or leg; trouble walking; dizziness; loss of balance or coordination stomach pain sweating swelling of legs or ankles vomiting weight gain Side effects that usually do not require medical attention (report to your doctor or health care professional if they continue or are bothersome): back pain changes in taste decreased appetite dry skin nausea tiredness This list may not describe all possible side effects. Call your doctor for medical advice about side effects. You may report side effects to FDA at 1-800-FDA-1088. Where should I keep my medication? This drug is given in a hospital or clinic and will not be stored at home. NOTE: This sheet is a summary. It may not cover all possible information. If you have questions about this medicine, talk to  your doctor, pharmacist, or health care provider.  2023 Elsevier/Gold Standard (2021-10-31 00:00:00)

## 2022-05-26 ENCOUNTER — Telehealth: Payer: Self-pay | Admitting: Hematology

## 2022-05-26 NOTE — Telephone Encounter (Signed)
Scheduled upcoming appointment per 6/12 los. Patient is aware.

## 2022-05-27 ENCOUNTER — Ambulatory Visit (HOSPITAL_COMMUNITY)
Admission: RE | Admit: 2022-05-27 | Discharge: 2022-05-27 | Disposition: A | Discharge status: Home / Self Care | Payer: Medicare HMO | Source: Ambulatory Visit | Attending: Nurse Practitioner | Admitting: Nurse Practitioner

## 2022-05-27 DIAGNOSIS — R053 Chronic cough: Secondary | ICD-10-CM | POA: Insufficient documentation

## 2022-05-27 DIAGNOSIS — R059 Cough, unspecified: Secondary | ICD-10-CM | POA: Diagnosis not present

## 2022-05-27 DIAGNOSIS — C189 Malignant neoplasm of colon, unspecified: Secondary | ICD-10-CM | POA: Diagnosis not present

## 2022-06-02 DIAGNOSIS — H2511 Age-related nuclear cataract, right eye: Secondary | ICD-10-CM | POA: Diagnosis not present

## 2022-06-04 DIAGNOSIS — H2511 Age-related nuclear cataract, right eye: Secondary | ICD-10-CM | POA: Diagnosis not present

## 2022-06-08 ENCOUNTER — Encounter: Payer: Self-pay | Admitting: Pulmonary Disease

## 2022-06-08 ENCOUNTER — Ambulatory Visit: Payer: Medicare HMO | Admitting: Pulmonary Disease

## 2022-06-08 VITALS — BP 126/82 | HR 96 | Temp 97.5°F | Ht 62.0 in | Wt 116.0 lb

## 2022-06-08 DIAGNOSIS — C799 Secondary malignant neoplasm of unspecified site: Secondary | ICD-10-CM | POA: Diagnosis not present

## 2022-06-08 DIAGNOSIS — J449 Chronic obstructive pulmonary disease, unspecified: Secondary | ICD-10-CM | POA: Diagnosis not present

## 2022-06-08 DIAGNOSIS — Z9889 Other specified postprocedural states: Secondary | ICD-10-CM | POA: Diagnosis not present

## 2022-06-08 MED ORDER — TRELEGY ELLIPTA 100-62.5-25 MCG/ACT IN AEPB: 1.0000 | INHALATION_SPRAY | Freq: Every day | RESPIRATORY_TRACT | 3 refills | Status: AC

## 2022-06-08 MED ORDER — HYDROCODONE BIT-HOMATROP MBR 5-1.5 MG/5ML PO SOLN: 5.0000 mL | Freq: Four times a day (QID) | ORAL | 0 refills | Status: DC | PRN

## 2022-06-08 MED ORDER — DOXYCYCLINE HYCLATE 100 MG PO TABS: 100.0000 mg | ORAL_TABLET | Freq: Two times a day (BID) | ORAL | 0 refills | Status: AC

## 2022-06-08 MED ORDER — LORATADINE 10 MG PO TABS
10.0000 mg | ORAL_TABLET | Freq: Every day | ORAL | 3 refills | Status: DC
Start: 2022-06-08 — End: 2023-05-01

## 2022-06-08 MED ORDER — ALBUTEROL SULFATE HFA 108 (90 BASE) MCG/ACT IN AERS
2.0000 | INHALATION_SPRAY | Freq: Four times a day (QID) | RESPIRATORY_TRACT | 5 refills | Status: DC | PRN
Start: 2022-06-08 — End: 2023-07-17

## 2022-06-08 MED ORDER — FLUTICASONE PROPIONATE 50 MCG/ACT NA SUSP: 2.0000 | Freq: Every day | NASAL | 5 refills | Status: DC

## 2022-06-08 MED ORDER — PREDNISONE 10 MG PO TABS: ORAL_TABLET | ORAL | 0 refills | Status: DC

## 2022-06-08 MED ORDER — ALBUTEROL SULFATE (2.5 MG/3ML) 0.083% IN NEBU: 2.5000 mg | INHALATION_SOLUTION | RESPIRATORY_TRACT | 5 refills | Status: DC | PRN

## 2022-06-13 ENCOUNTER — Other Ambulatory Visit: Payer: Self-pay | Admitting: Nurse Practitioner

## 2022-06-15 ENCOUNTER — Ambulatory Visit: Payer: Medicare HMO

## 2022-06-15 ENCOUNTER — Other Ambulatory Visit: Payer: Medicare HMO

## 2022-06-15 ENCOUNTER — Ambulatory Visit: Payer: Medicare HMO | Admitting: Hematology

## 2022-06-16 ENCOUNTER — Other Ambulatory Visit: Payer: Self-pay | Admitting: Hematology

## 2022-06-18 ENCOUNTER — Encounter: Payer: Self-pay | Admitting: Hematology

## 2022-06-18 ENCOUNTER — Inpatient Hospital Stay: Payer: Medicare HMO

## 2022-06-18 ENCOUNTER — Other Ambulatory Visit: Payer: Self-pay

## 2022-06-18 ENCOUNTER — Other Ambulatory Visit: Payer: Self-pay | Admitting: Hematology

## 2022-06-18 ENCOUNTER — Inpatient Hospital Stay: Payer: Medicare HMO | Admitting: Hematology

## 2022-06-18 ENCOUNTER — Inpatient Hospital Stay: Payer: Medicare HMO | Attending: Hematology

## 2022-06-18 VITALS — BP 148/80 | HR 88 | Temp 97.9°F | Resp 19 | Ht 62.0 in | Wt 115.5 lb

## 2022-06-18 DIAGNOSIS — Z5112 Encounter for antineoplastic immunotherapy: Secondary | ICD-10-CM | POA: Diagnosis not present

## 2022-06-18 DIAGNOSIS — Z86711 Personal history of pulmonary embolism: Secondary | ICD-10-CM | POA: Insufficient documentation

## 2022-06-18 DIAGNOSIS — Z79899 Other long term (current) drug therapy: Secondary | ICD-10-CM | POA: Insufficient documentation

## 2022-06-18 DIAGNOSIS — Z5111 Encounter for antineoplastic chemotherapy: Secondary | ICD-10-CM | POA: Diagnosis not present

## 2022-06-18 DIAGNOSIS — I10 Essential (primary) hypertension: Secondary | ICD-10-CM | POA: Insufficient documentation

## 2022-06-18 DIAGNOSIS — R69 Illness, unspecified: Secondary | ICD-10-CM | POA: Diagnosis not present

## 2022-06-18 DIAGNOSIS — C786 Secondary malignant neoplasm of retroperitoneum and peritoneum: Secondary | ICD-10-CM | POA: Insufficient documentation

## 2022-06-18 DIAGNOSIS — Z7952 Long term (current) use of systemic steroids: Secondary | ICD-10-CM | POA: Insufficient documentation

## 2022-06-18 DIAGNOSIS — Z933 Colostomy status: Secondary | ICD-10-CM | POA: Diagnosis not present

## 2022-06-18 DIAGNOSIS — C181 Malignant neoplasm of appendix: Secondary | ICD-10-CM

## 2022-06-18 DIAGNOSIS — C799 Secondary malignant neoplasm of unspecified site: Secondary | ICD-10-CM

## 2022-06-18 DIAGNOSIS — R058 Other specified cough: Secondary | ICD-10-CM | POA: Insufficient documentation

## 2022-06-18 DIAGNOSIS — F419 Anxiety disorder, unspecified: Secondary | ICD-10-CM | POA: Insufficient documentation

## 2022-06-18 DIAGNOSIS — C20 Malignant neoplasm of rectum: Secondary | ICD-10-CM | POA: Insufficient documentation

## 2022-06-18 DIAGNOSIS — Z7901 Long term (current) use of anticoagulants: Secondary | ICD-10-CM | POA: Insufficient documentation

## 2022-06-18 DIAGNOSIS — J439 Emphysema, unspecified: Secondary | ICD-10-CM | POA: Insufficient documentation

## 2022-06-18 LAB — CBC WITH DIFFERENTIAL/PLATELET
Abs Immature Granulocytes: 0.01 10*3/uL (ref 0.00–0.07)
Basophils Absolute: 0 10*3/uL (ref 0.0–0.1)
Basophils Relative: 1 %
Eosinophils Absolute: 0.1 10*3/uL (ref 0.0–0.5)
Eosinophils Relative: 2 %
HCT: 35.4 % — ABNORMAL LOW (ref 36.0–46.0)
Hemoglobin: 12.2 g/dL (ref 12.0–15.0)
Immature Granulocytes: 0 %
Lymphocytes Relative: 25 %
Lymphs Abs: 1.5 10*3/uL (ref 0.7–4.0)
MCH: 35.2 pg — ABNORMAL HIGH (ref 26.0–34.0)
MCHC: 34.5 g/dL (ref 30.0–36.0)
MCV: 102 fL — ABNORMAL HIGH (ref 80.0–100.0)
Monocytes Absolute: 0.7 10*3/uL (ref 0.1–1.0)
Monocytes Relative: 12 %
Neutro Abs: 3.6 10*3/uL (ref 1.7–7.7)
Neutrophils Relative %: 60 %
Platelets: 259 10*3/uL (ref 150–400)
RBC: 3.47 MIL/uL — ABNORMAL LOW (ref 3.87–5.11)
RDW: 14.8 % (ref 11.5–15.5)
WBC: 6 10*3/uL (ref 4.0–10.5)
nRBC: 0 % (ref 0.0–0.2)

## 2022-06-18 LAB — COMPREHENSIVE METABOLIC PANEL
ALT: 8 U/L (ref 0–44)
AST: 16 U/L (ref 15–41)
Albumin: 4 g/dL (ref 3.5–5.0)
Alkaline Phosphatase: 111 U/L (ref 38–126)
Anion gap: 8 (ref 5–15)
BUN: 16 mg/dL (ref 8–23)
CO2: 26 mmol/L (ref 22–32)
Calcium: 9.3 mg/dL (ref 8.9–10.3)
Chloride: 102 mmol/L (ref 98–111)
Creatinine, Ser: 0.85 mg/dL (ref 0.44–1.00)
GFR, Estimated: >60 mL/min (ref >60)
Glucose, Bld: 111 mg/dL — ABNORMAL HIGH (ref 70–99)
Potassium: 3.7 mmol/L (ref 3.5–5.1)
Sodium: 136 mmol/L (ref 135–145)
Total Bilirubin: 0.7 mg/dL (ref 0.3–1.2)
Total Protein: 7.1 g/dL (ref 6.5–8.1)

## 2022-06-18 LAB — TOTAL PROTEIN, URINE DIPSTICK: Protein, ur: NEGATIVE mg/dL

## 2022-06-18 LAB — CEA (IN HOUSE-CHCC): CEA (CHCC-In House): 8.79 ng/mL — ABNORMAL HIGH (ref 0.00–5.00)

## 2022-06-18 MED ORDER — SODIUM CHLORIDE 0.9% FLUSH
10.0000 mL | Freq: Once | INTRAVENOUS | Status: AC
  Administered 2022-06-18: 10 mL

## 2022-06-18 MED ORDER — CAPECITABINE 500 MG PO TABS: ORAL_TABLET | ORAL | 1 refills | Status: DC

## 2022-06-18 MED ORDER — DEXTROSE 5 % IV SOLN: Freq: Once | INTRAVENOUS | Status: AC

## 2022-06-18 MED ORDER — SODIUM CHLORIDE 0.9 % IV SOLN
10.0000 mg | Freq: Once | INTRAVENOUS | Status: AC
  Administered 2022-06-18: 10 mg via INTRAVENOUS
  Filled 2022-06-18: qty 10

## 2022-06-18 MED ORDER — SODIUM CHLORIDE 0.9% FLUSH
10.0000 mL | INTRAVENOUS | Status: DC | PRN
  Administered 2022-06-18: 10 mL

## 2022-06-18 MED ORDER — HEPARIN SOD (PORK) LOCK FLUSH 100 UNIT/ML IV SOLN
500.0000 [IU] | Freq: Once | INTRAVENOUS | Status: AC | PRN
  Administered 2022-06-18: 500 [IU]

## 2022-06-18 MED ORDER — PALONOSETRON HCL INJECTION 0.25 MG/5ML
0.2500 mg | Freq: Once | INTRAVENOUS | Status: AC
  Administered 2022-06-18: 0.25 mg via INTRAVENOUS
  Filled 2022-06-18: qty 5

## 2022-06-18 MED ORDER — SODIUM CHLORIDE 0.9 % IV SOLN: Freq: Once | INTRAVENOUS | Status: AC

## 2022-06-18 MED ORDER — OXALIPLATIN CHEMO INJECTION 100 MG/20ML
50.0000 mg/m2 | Freq: Once | INTRAVENOUS | Status: AC
  Administered 2022-06-18: 80 mg via INTRAVENOUS
  Filled 2022-06-18: qty 16

## 2022-06-18 MED ORDER — SODIUM CHLORIDE 0.9 % IV SOLN
400.0000 mg | Freq: Once | INTRAVENOUS | Status: AC
  Administered 2022-06-18: 400 mg via INTRAVENOUS
  Filled 2022-06-18: qty 16

## 2022-06-18 NOTE — Progress Notes (Signed)
[place]   Telephone:([phone]) [phone] Fax:(336) [phone]   Clinic Follow up Note   Patient Care Team: [name], DO as PCP - General (Family Medicine) [name], MD as Consulting Physician (Hematology) Icard, Octavio Graves, DO as Consulting Physician (Pulmonary Disease) [place], P.A. [name], MD as Consulting Physician (General Surgery) [name], MD as Consulting Physician (General Surgery) [name], [name], MD as Consulting Physician (Gastroenterology)  Date of Service:  06/18/2022  CHIEF COMPLAINT: f/u of colon cancer  CURRENT THERAPY:  CAPOX -Xeloda started 02/01/21, currently 1500 mg BID 7 days on/7 days off -Bevacizumab q2-3 weeks added with C2 02/24/21             -Beva held 09/02/21-12/28/21 d/t PE -Oxaliplatin added with C4 03/24/21, currently q3weeks  ASSESSMENT & PLAN:  Tracy [name] is a 71 y.o. female with   1. [name], MMR normal, peritoneal metastasis in 01/2021 -diagnosed 02/2020, due to perforation she underwent emergent surgery. Path showed invasive adenocarcinoma with perforation, +3/15 LNs, and clear margins. Initial CT scan negative for distant metastasis. She was not a candidate for adjuvant chemo due to very slow recovery from surgery -Guardant Reveal ctDNA were negative (06/06/20, 07/12/20, and 09/10/20). -surveillance colonoscopy by Dr. Havery Moros 01/06/21 showed new adenocarcinoma at appendiceal orifice. Biopsy confirmed adenocarcinoma -01/27/21 PET showed tumor in appendiceal orifice is likely peritoneal metastasis growing into the cecum.  Her peritoneal implants are mainly in the pelvis, difficult to biopsy -She began single agent Xeloda with cycle 1 on 02/01/21.  Due to low performance status with cycle 2 was changed to 1 week on/1 week off, and beva was added. She tolerates this well -PS improved and low dose oxaliplatin was added with C4, now on Capeox/beva q3 weeks, with  Xeloda 1 week on/1 week off. -Beva was held for PE found on CT 08/2021, she is on xarelto. Resumed beva q3 weeks 12/29/21 -restaging CT CAP on 04/09/22 was stable, with no new or progressive findings.  -labs reviewed, overall stable, urine protein negative, adequate to proceed with beva and oxali today  -Follow-up in 3 weeks.   2. Productive Cough -she completed a cycle of doxycycline/prednisone in 03/2022 for a possible sinus infection following allergies. She initially noted improvement. -CT CAP on 04/09/22 was stable. Chest x-ray on 05/27/22 showed blunting of costophrenic angles may represent pleural thickening versus small pleural effusions. -due to persistent cough, she was seen and prescribed hycodan cough syrup on 06/08/22 by Dr. Valeta Harms.   3. HTN -secondary to Beva -she has an intolerance listed for amlodipine. She started losartan 61m daily on 03/02/22.   4. Bilateral PE, found on restaging CT CAP 08/14/21  -no hypoxia, stable respiratory status. She began xarelto 08/15/21 -Beva was held, CT 12/25/21 was clear, Beva resumed 12/29/21   5. COVID-19 + 05/21/21 and respiratory infection -she received CAPOX/beva on 05/19/21, tested positive on 05/21/21 -treated with paxlovid and subsequently doxycycline, augment, and prednisone. Recovered well.   6. Comorbidities: Asthma, COPD, anxiety -on trelegy, Claritin, flonase, and albuterol for her COPD/asthma per Dr. IValeta Harms Uses albuterol nebulizer for exacerbation. -f/u pulmonology and PCP.      PLAN: -proceed with Beva and oxali at same dose today  -continue Xeloda at same dose; she started this morning -lab, flush, f/u, and oxali/beva in 3 and 6 weeks   No problem-specific Assessment & Plan notes found for this encounter.   SUMMARY OF ONCOLOGIC HISTORY: Oncology History Overview Note  Cancer  Staging Rectal cancer [facility]) Staging form: Colon and Rectum, AJCC 8th Edition - Pathologic stage from 03/03/2020: Stage IIIB (pT4a, pN1b, cM0) - Signed by  [name], MD on 01/28/2021 Stage prefix: Initial diagnosis Histologic grading system: 4 grade system Histologic grade (G): G2 Residual tumor (R): R0 - None    Rectal cancer ([place])  03/03/2020 - 04/05/2020 Hospital Admission   She was admitted to ED on 03/03/20 for abdominal pain and nausea. She had been having diarrhea for several weeks. During hospital stay she developed acute respiratory failure, AKI, RUE DVT from PICC line. Work up showed bowel perforation, small left liver mass and thickening of jejunum. She underwent emergent surgery on 03/03/20 for resection and colostomy placement. Her path showed invasive cancer, metastatic to 3/15 LNs. She had a NGT in placed but this was removed on POD 3. Post op her stoma became necrotic and septic. She had another bowel surgery on 03/12/20, which was NED with necrotic tissue.    03/03/2020 Imaging   CT AP 03/03/20  IMPRESSION: 1. Free intraperitoneal air consistent perforation. Most likely site of perforation is in the distal splenic flexure/proximal descending colon where there is a collection of air and gas measuring 6 centimeters. No obvious soft tissue mass identified in this region. At this site, there is abrupt transition of dilated, stool-filled colon to completely decompressed proximal descending colon. 2. Thickened, inflamed loops of jejunum are identified within the pelvis and are likely reactive. 3. Small hiatal hernia. 4. Benign-appearing 1.6 centimeter mass the LEFT hepatic lobe. Recommend comparison with prior studies if available. 5.  Emphysema (ICD10-J43.9). 6.  Aortic Atherosclerosis (ICD10-I70.0). 7. Bilateral renal scarring.   03/03/2020 Surgery   low anterior resection end colostomy by Dr Marcello Moores    03/03/2020 Initial Biopsy   FINAL MICROSCOPIC DIAGNOSIS: 03/03/20 A. RECTOSIGMOID COLON, LOW ANTERIOR RESECTION:  - Invasive colonic adenocarcinoma, 3.5 cm.  - Tumor invades the visceral peritoneum.  - Margins of resection are not  involved.  - Metastatic carcinoma in (3) of (15) lymph nodes.  - See oncology table.   B. ADDITIONAL SIGMOID COLON, RESECTION:  - Colonic tissue, negative for carcinoma.  ADDENDUM:  Mismatch Repair Protein (IHC)   SUMMARY INTERPRETATION: NORMAL  There is preserved expression of the major MMR proteins. There is a very  low probability that microsatellite instability (MSI) is present.  However, certain clinically significant MMR protein mutations may result  in preservation of nuclear expression. It is recommended that the  preservation of protein expression be correlated with molecular based  MSI testing.   IHC EXPRESSION RESULTS  TEST           RESULT  MLH1:          Preserved nuclear expression  MSH2:          Preserved nuclear expression  MSH6:          Preserved nuclear expression  PMS2:          Preserved nuclear expression   03/03/2020 Cancer Staging   Staging form: Colon and Rectum, AJCC 8th Edition - Pathologic stage from 03/03/2020: Stage IIIB (pT4a, pN1b, cM0) - Signed by [name], MD on 01/28/2021 Stage prefix: Initial diagnosis Histologic grading system: 4 grade system Histologic grade (G): G2 Residual tumor (R): R0 - None   03/08/2020 Imaging   CT AP 03/08/20 IMPRESSION: 1. Interval midline laparotomy with distal colon resection and diverting left lower quadrant colostomy. 2. Diffuse small bowel dilatation with gas fluid levels most consistent with postoperative  ileus. 3. Trace ascites within the abdomen and pelvis. No fluid collection or abscess at this time. Surgical drain within the lower pelvis. 4. Indeterminate 1.4 cm subcapsular liver hypodensity. In light of newly diagnosed rectal cancer, metastatic disease cannot be excluded. PET CT may be useful for further evaluation. 5. Interval development of trace bilateral pleural effusions and diffuse body wall edema.   03/12/2020 Surgery   EXPLORATORY LAPAROTOMY WITH BOWEL RESECTION AND COLOSTOMY by Dr Hassell Done  03/12/20  FINAL MICROSCOPIC DIAGNOSIS: 03/12/20  A. COLON, SPLENIC FLEXURE, RESECTION:  - Segment of colon (37 cm) with perforation and associated inflammation  - Multiple mucosal ulcers with necrotizing inflammation  - No evidence of malignancy    03/12/2020 Imaging   CT AP WO contrast 03/12/20  IMPRESSION: 1. Exam is limited by lack of intravenous contrast, motion, and artifact from the patient's arms adjacent to the torso. Since 03/08/2020, there has been development of relatively large pockets of intraperitoneal free air. While intraperitoneal gas would not be unexpected on postoperative day 9, this gas is new since an intervening study of 03/08/2020 and given the relatively large volume certainly raises concern for bowel perforation. No source for the intraperitoneal free air is evident on this study. There is a surgical drain in the pelvis, but there is no free gas around the drain itself to suggest that it represents the source. 2. New circumferential wall thickening in the splenic flexure, descending colon and sigmoid colon leading into the end colostomy. Infection/inflammation would be a consideration. Ischemia cannot be excluded. 3. Relatively small volume intraperitoneal free fluid. High attenuation small fluid collections in the left upper abdomen may reflect hemorrhage, infection or residua from prior perforation. 4. Interval progression of diffuse body wall edema. 5. Residual contrast material in the renal parenchyma from prior imaging, compatible with renal dysfunction.    03/19/2020 Imaging   CT CAP 03/19/20  IMPRESSION: 1. 5.5 x 3.2 cm fluid collection is noted along the greater curvature of the proximal stomach. 2. Surgical drain is again noted in the pelvis with tip in left lower quadrant. 3. Interval development of crescent-shaped fluid collection measuring 16.4 x 3.3 cm in the epigastric region and left upper quadrant of the abdomen which may extend into the  left lower quadrant. Potentially this may represent abscess or developing abscess. Multiple other smaller fluid collections are noted which may represent small abscesses. 4. 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 5. Mild amount of free fluid is noted in the posterior pelvis. 6. Moderate anasarca is noted. Aortic Atherosclerosis (ICD10-I70.0).   04/12/2020 Imaging   CT AP 04/12/20  IMPRESSION: 1. Fluid collections within the LEFT abdomen have significantly decreased compared to previous CT exams, now nearly completely resolved. 2. Percutaneous drainage catheter with tip coiled posterior to the LEFT kidney, stable positioning compared to the previous study. The more anterior catheter has been removed. 3. Small amount of free fluid persists within the abdomen and pelvis. 4. Trace bibasilar pleural effusions. 5. Anasarca. 6. While reviewing today's study, comparing with a chest/abdomen/pelvis CT from earlier same month, there is question of thrombus in the LEFT internal jugular vein. Recommend ultrasound of the LEFT IJ to exclude DVT. This recommendation discussed with patient's hospitalist, Dr. Owens Shark, on 04/12/2020 at 4:20 p.m.   Emphysema (ICD10-J43.9).   04/22/2020 Imaging   CT AP 04/22/20  IMPRESSION: 1. No recurrent intra-abdominal abscess status post interval removal of left retroperitoneal percutaneous drain. Stable small amount of free pelvic fluid. 2. Enlarging dependent bilateral  pleural effusions, now moderate in volume. Associated atelectasis at both lung bases. 3. Progressive anasarca with generalized edema throughout the subcutaneous fat. 4. Stable small subcapsular fluid collection along the anterior aspect of the left hepatic lobe. 5. Aortic Atherosclerosis (ICD10-I70.0).   05/23/2020 Initial Diagnosis   Rectal cancer ([place])   01/14/2021 Imaging   CT CAP IMPRESSION: -5.0 cm soft tissue mass along the posterior aspect of the cecum at the base of the  appendix, favored to reflect a peritoneal/serosal implant, corresponding to the patient's known adenocarcinoma. This is new from the prior. -Suspected additional pelvic implants along the uterus and right adnexa, poorly visualized, new/progressive from the prior. -Additional pericapsular lesion along the lateral spleen is mildly progressive, suspicious for additional peritoneal implant. -No evidence of metastatic disease in the chest.   01/27/2021 PET scan   IMPRESSION:  1. Extensive hypermetabolic peritoneal metastasis, as detailed  above.  2. No evidence of hypermetabolic supradiaphragmatic disease.  3. Diffuse low-level thyroid hypermetabolism can be seen in the  setting of thyroiditis. Consider correlation with thyroid function  labs.  4. Aortic atherosclerosis (ICD10-I70.0) and emphysema (ICD10-J43.9).    02/01/2021 -  Chemotherapy    First-line chemo Xeloda 1500 mg twice daily for day 1-14, every 21 days, started on 02/01/2021. Reduced to 1 week on/ 1 week off and added bevacizumab q2weeks from C2 on 02/24/21.  ----Given good tolerance, I changed to CAPOX and bevacizumab q2weeks from C4 on 03/24/21 with Xeloda 1534m in the AM and 10054min the PM 1 week on/1 week off.     05/08/2021 Imaging   PET  IMPRESSION: 1. There is been interval development of several FDG avid pulmonary nodules within both lungs. Additionally, there is new FDG avid low left paratracheal lymph node. Thoracic metastasis cannot be excluded. 2. Interval improvement in multifocal FDG avid peritoneal metastasis as detailed above.     08/14/2021 Imaging   CT CAP  IMPRESSION: Resolution of previously seen small bilateral pulmonary nodules since previous study.   Stable 8 mm mediastinal lymph node in the AP window.   No evidence of metastatic disease within the abdomen or pelvis.   Nonocclusive pulmonary embolism in bilateral central lower lobe pulmonary arteries, which appears subacute in age.    12/25/2021 Imaging   EXAM: CT CHEST, ABDOMEN, AND PELVIS WITH CONTRAST  IMPRESSION: 1. Minimal, bandlike residua of previously seen FDG avid pulmonary nodules, consistent with treated metastases. No new pulmonary nodules. 2. Unchanged post treatment appearance of the cecal base, with soft tissue thickening about the cecal base and adjacent pelvis, previously FDG avid and in keeping with known appendiceal orifice malignancy. 3. Unchanged central fluid attenuation lesions about the spleen, left iliopsoas, and left pelvic sidewall, previously FDG avid, consistent with treated peritoneal implants. 4. No evidence of new metastatic disease in the chest, abdomen, or pelvis 5. Status post rectosigmoid colon resection with left lower quadrant end colostomy.   Aortic Atherosclerosis (ICD10-I70.0).   Malignant [place] of [place] 01/06/2021 Procedure   (surveillance colonoscopy for h/o rectal cancer) Impression:  - Preparation of the colon was fair, lavage performed with mostly adequate views. - Polypoid lesion obliterating appendiceal orifice. Biopsied to evaluate for malignancy. - The examined portion of the ileum was normal. - Two large polyps in the cecum, not removed today pending path at appendiceal orifice, bowel prep. If removed endoscopically in the future would favor EMR to be done at the hospital. - One 5 mm polyp at the ileocecal valve, removed with a  cold snare. Resected and retrieved. - One 3 mm polyp in the transverse colon, removed with a cold snare. Resected and retrieved. - Parastomal hernia making initial entry to the distal colon challenging. - The examination was otherwise normal.   01/06/2021 Initial Biopsy   Diagnosis 1. Colon, biopsy, appendiceal oraface - ADENOCARCINOMA. 2. Colon, polyp(s), ileocecal valve, transverse, x2 - TUBULAR ADENOMA, NEGATIVE FOR HIGH GRADE DYSPLASIA (X1). - COLONIC MUCOSA WITH UNDERLYING LYMPHOID AGGREGATE, NEGATIVE FOR DYSPLASIA  (X1).  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 This is new from the prior. -Suspected additional pelvic implants along the uterus and right adnexa, poorly visualized, new/progressive from the prior. -Additional pericapsular lesion along the lateral spleen is mildly progressive, suspicious for additional peritoneal implant. -No evidence of metastatic disease in the chest.   08/14/2021 Imaging   CT CAP IMPRESSION: Resolution of previously seen small bilateral pulmonary nodules since previous study. Stable 8 mm mediastinal lymph node in the AP window. No evidence of metastatic disease within the abdomen or pelvis. Nonocclusive pulmonary embolism in bilateral central lower lobe pulmonary arteries, which appears subacute in age.  Aortic Atherosclerosis (ICD10-I70.0) and Emphysema (ICD10-J43.9).     08/14/2021 Imaging   CT CAP  IMPRESSION: Resolution of previously seen small bilateral pulmonary nodules since previous study.   Stable 8 mm mediastinal lymph node in the AP window.   No evidence of metastatic disease within the abdomen or pelvis.   Nonocclusive pulmonary embolism in bilateral central lower lobe pulmonary arteries, which appears subacute in age.   12/25/2021 Imaging   EXAM: CT CHEST, ABDOMEN, AND PELVIS WITH CONTRAST  IMPRESSION: 1. Minimal, bandlike residua of previously seen FDG avid pulmonary nodules, consistent with treated metastases. No new pulmonary nodules. 2. Unchanged post treatment appearance of the  cecal base, with soft tissue thickening about the cecal base and adjacent pelvis, previously FDG avid and in keeping with known appendiceal orifice malignancy. 3. Unchanged central fluid attenuation lesions about the spleen, left iliopsoas, and left pelvic sidewall, previously FDG avid, consistent with treated peritoneal implants. 4. No evidence of new metastatic disease in the chest, abdomen, or pelvis 5. Status post rectosigmoid colon resection with left lower quadrant end colostomy.   Aortic Atherosclerosis (ICD10-I70.0).      INTERVAL HISTORY:  Tracy [name] is here for a follow up of colon cancer. She was last seen by NP Lacie on 05/25/22. She presents to the clinic alone. She reports she is doing well overall. She denies any new side effects or concerns.   All other systems were reviewed with the patient and are negative.  MEDICAL HISTORY:  Past Medical History:  Diagnosis Date   Acute deep vein thrombosis (DVT) of brachial vein of right upper extremity (HCC)    Acute on chronic respiratory failure with hypoxia (HCC)    Asthma    Cataract    CHICKENPOX, HX OF 01/06/2011   Qualifier: Diagnosis of  By: Charlett Blake MD, Stacey     Colon cancer [facility])    COPD (chronic obstructive pulmonary disease) ([place])  2011   FeV1 31% predicted FeV1/FVX 47 %   COVID-19 05/2021   Essential hypertension 05/23/2020   Hemorrhoid    Open right radial fracture 2020   Osteopenia    Perforated sigmoid colon ([place]) 03/03/2020   Pleural effusion    Pneumonia 2021   Postoperative intra-abdominal abscess 2021   Rectal cancer (HCC)    Seasonal allergies    takes Claritin daily prn   Shock circulatory ([place])    Tracheostomy status ([place])    Vitamin D deficiency    takes Vit d every 14 days    SURGICAL HISTORY: Past Surgical History:  Procedure Laterality Date   AUGMENTATION MAMMAPLASTY     saline   EXAMINATION UNDER ANESTHESIA  10/14/2012   Procedure: EXAM UNDER ANESTHESIA;  Surgeon: Gayland Curry, MD,FACS;  Location: [place];  Service: General;  Laterality: N/A;  rectal exam under anesthesia excisional hemorrhoidectomy hemorrhoidal banding x two   EXAMINATION UNDER ANESTHESIA  02/03/2013   excision hemorrhoidal tissue   FOOT SURGERY     left bunionectomy   HEMORRHOIDECTOMY WITH HEMORRHOID BANDING  10/14/2012   Procedure: HEMORRHOIDECTOMY WITH HEMORRHOID BANDING;  Surgeon: Gayland Curry, MD,FACS;  Location: [facility];  Service: General;  Laterality: N/A;   IR IMAGING GUIDED PORT INSERTION  02/03/2021   IR THORACENTESIS ASP PLEURAL SPACE W/IMG GUIDE  04/23/2020   LAPAROTOMY N/A 03/03/2020   Procedure: low anterior resection end colostomy;  Surgeon: [name], MD;  Location: [facility];  Service: General;  Laterality: N/A;   LAPAROTOMY N/A 03/12/2020   Procedure: EXPLORATORY LAPAROTOMY WITH BOWEL RESECTION AND COLOSTOMY;  Surgeon: [name], MD;  Location: [facility];  Service: General;  Laterality: N/A;   OPEN REDUCTION INTERNAL FIXATION (ORIF) DISTAL RADIAL FRACTURE Right 11/01/2018   Procedure: OPEN REDUCTION INTERNAL FIXATION (ORIF) DISTAL RADIAL FRACTURE;  Surgeon: [name], MD;  Location: [facility];  Service: Orthopedics;  Laterality: Right;   TONSILLECTOMY  1960   recurrent otitis media   US ECHOCARDIOGRAPHY  02/2020    poor windows, normal LV function, severely dilated RV with moderately reduced function, RV volume and pressure overload, mildly dilated RA    I have reviewed the social history and family history with the patient and they are unchanged from previous note.  ALLERGIES:  is allergic to amlodipine and codeine.  MEDICATIONS:  Current Outpatient Medications  Medication Sig Dispense Refill   albuterol (PROVENTIL) (2.5 MG/3ML) 0.083% nebulizer solution Take 3 mLs (2.5 mg total) by nebulization every 4 (four) hours as needed for wheezing or shortness of breath. DX: J44.9 360 mL 5   albuterol (VENTOLIN HFA) 108 (90 Base) MCG/ACT inhaler Inhale 2 puffs  into the lungs every 6 (six) hours as needed for wheezing or shortness of breath. 8 g 5   ALPRAZolam (XANAX) 0.25 MG tablet Take 1 tablet (0.25 mg total) by mouth at bedtime as needed for anxiety. 30 tablet 0   capecitabine (XELODA) 500 MG tablet Take 3 tablets every 12 hours. Take for 7 days on, 7 days off, repeat every 14 days. 84 tablet 1   docusate sodium (COLACE) 50 MG capsule Take 50 mg by mouth daily as needed for mild constipation.     doxycycline (VIBRA-TABS) 100 MG tablet Take 1 tablet (100 mg total) by mouth 2 (two) times daily. 14 tablet 0   fluticasone (FLONASE) 50 MCG/ACT nasal spray Place 2 sprays into both nostrils daily. 16 mL 5   Fluticasone-Umeclidin-Vilant (TRELEGY ELLIPTA) 100-62.5-25 MCG/ACT AEPB Inhale  1 puff into the lungs daily. 90 each 3   Fluticasone-Umeclidin-Vilant (TRELEGY ELLIPTA) 100-62.5-25 MCG/INH AEPB Inhale 1 puff into the lungs daily. 60 each 5   HYDROcodone bit-homatropine (HYCODAN) 5-1.5 MG/5ML syrup Take 5 mLs by mouth every 6 (six) hours as needed for cough. 240 mL 0   lidocaine-prilocaine (EMLA) cream Apply 1 application. topically as needed. 30 g 2   loratadine (CLARITIN) 10 MG tablet Take 1 tablet (10 mg total) by mouth daily. 90 tablet 3   losartan (COZAAR) 25 MG tablet TAKE 1 TABLET (25 MG TOTAL) BY MOUTH DAILY. 30 tablet 1   ondansetron (ZOFRAN) 8 MG tablet Take 1 tablet (8 mg total) by mouth every 8 (eight) hours as needed for nausea or vomiting. 20 tablet 2   predniSONE (DELTASONE) 10 MG tablet Take 4 tabs by mouth once daily x4 days, then 3 tabs x4 days, 2 tabs x4 days, 1 tab x4 days and stop. 40 tablet 0   prochlorperazine (COMPAZINE) 10 MG tablet Take 1 tablet (10 mg total) by mouth every 6 (six) hours as needed for nausea or vomiting. 30 tablet 2   XARELTO 20 MG TABS tablet TAKE 1 TABLET BY MOUTH DAILY WITH SUPPER 30 tablet 0   No current facility-administered medications for this visit.    PHYSICAL EXAMINATION: ECOG PERFORMANCE STATUS: 2 -  Symptomatic, <50% confined to bed  Vitals:   06/18/22 1002  BP: (!) 148/80  Pulse: 88  Resp: 19  Temp: 97.9 F (36.6 C)  SpO2: 98%   Wt Readings from Last 3 Encounters:  06/18/22 115 lb 8 oz (52.4 kg)  06/08/22 116 lb (52.6 kg)  05/25/22 116 lb 8 oz (52.8 kg)     GENERAL:alert, no distress and comfortable SKIN: skin color, texture, turgor are normal, no rashes or significant lesions EYES: normal, Conjunctiva are pink and non-injected, sclera clear  LUNGS: clear to auscultation and percussion with normal breathing effort HEART: regular rate & rhythm and no murmurs and no lower extremity edema NEURO: alert & oriented x 3 with fluent speech, no focal motor/sensory deficits  LABORATORY DATA:  I have reviewed the data as listed    Latest Ref Rng & Units 06/18/2022    9:31 AM 05/25/2022    9:21 AM 05/04/2022   10:21 AM  CBC  WBC 4.0 - 10.5 K/uL 6.0  7.1  6.1   Hemoglobin 12.0 - 15.0 g/dL 12.2  12.8  12.8   Hematocrit 36.0 - 46.0 % 35.4  38.0  37.5   Platelets 150 - 400 K/uL 259  295  278         Latest Ref Rng & Units 06/18/2022    9:31 AM 05/25/2022    9:21 AM 05/04/2022   10:21 AM  CMP  Glucose 70 - 99 mg/dL 111  100  99   BUN 8 - 23 mg/dL 16  13  17    Creatinine 0.44 - 1.00 mg/dL 0.85  0.97  1.06   Sodium 135 - 145 mmol/L 136  136  137   Potassium 3.5 - 5.1 mmol/L 3.7  4.2  3.9   Chloride 98 - 111 mmol/L 102  102  104   CO2 22 - 32 mmol/L 26  27  26    Calcium 8.9 - 10.3 mg/dL 9.3  9.7  9.4   Total Protein 6.5 - 8.1 g/dL 7.1  7.0  7.1   Total Bilirubin 0.3 - 1.2 mg/dL 0.7  0.5  0.5   Alkaline Phos  38 - 126 U/L 111  111  121   AST 15 - 41 U/L 16  19  26    ALT 0 - 44 U/L 8  10  13        RADIOGRAPHIC STUDIES: I have personally reviewed the radiological images as listed and agreed with the findings in the report. No results found.    No orders of the defined types were placed in this encounter.  All questions were answered. The patient knows to call the clinic  with any problems, questions or concerns. No barriers to learning was detected. The total time spent in the appointment was 30 minutes.     [name], MD 06/18/2022   I, Wilburn Mylar, am acting as scribe for [name], MD.   I have reviewed the above documentation for accuracy and completeness, and I agree with the above.

## 2022-06-18 NOTE — Patient Instructions (Signed)
[facility]  Discharge Instructions: Thank you for choosing [facility] to provide your oncology and hematology care.   If you have a lab appointment with the [facility], please go directly to the [facility] and check in at the registration area.   Wear comfortable clothing and clothing appropriate for easy access to any Portacath or PICC line.   We strive to give you quality time with your provider. You may need to reschedule your appointment if you arrive late (15 or more minutes).  Arriving late affects you and other patients whose appointments are after yours.  Also, if you miss three or more appointments without notifying the office, you may be dismissed from the clinic at the provider's discretion.      For prescription refill requests, have your pharmacy contact our office and allow 72 hours for refills to be completed.    Today you received the following chemotherapy and/or immunotherapy agents Bevacizumab, Oxaliplatin      To help prevent nausea and vomiting after your treatment, we encourage you to take your nausea medication as directed.  BELOW ARE SYMPTOMS THAT SHOULD BE REPORTED IMMEDIATELY: *FEVER GREATER THAN 100.4 F (38 C) OR HIGHER *CHILLS OR SWEATING *NAUSEA AND VOMITING THAT IS NOT CONTROLLED WITH YOUR NAUSEA MEDICATION *UNUSUAL SHORTNESS OF BREATH *UNUSUAL BRUISING OR BLEEDING *URINARY PROBLEMS (pain or burning when urinating, or frequent urination) *BOWEL PROBLEMS (unusual diarrhea, constipation, pain near the anus) TENDERNESS IN MOUTH AND THROAT WITH OR WITHOUT PRESENCE OF ULCERS (sore throat, sores in mouth, or a toothache) UNUSUAL RASH, SWELLING OR PAIN  UNUSUAL VAGINAL DISCHARGE OR ITCHING   Items with * indicate a potential emergency and should be followed up as soon as possible or go to the Emergency Department if any problems should occur.  Please show the CHEMOTHERAPY ALERT CARD or IMMUNOTHERAPY ALERT CARD  at check-in to the Emergency Department and triage nurse.  Should you have questions after your visit or need to cancel or reschedule your appointment, please contact [facility]  Dept: [phone]  and follow the prompts.  Office hours are 8:00 a.m. to 4:30 p.m. Monday - Friday. Please note that voicemails left after 4:00 p.m. may not be returned until the following business day.  We are closed weekends and major holidays. You have access to a nurse at all times for urgent questions. Please call the main number to the clinic Dept: [phone] and follow the prompts.   For any non-urgent questions, you may also contact your provider using MyChart. We now offer e-Visits for anyone 69 and older to request care online for non-urgent symptoms. For details visit [email or web address].   Also download the MyChart app! Go to the app store, search "MyChart", open the app, select [facility], and log in with your MyChart username and password.  Masks are optional in the cancer centers. If you would like for your care team to wear a mask while they are taking care of you, please let them know. For doctor visits, patients may have with them one support person who is at least 69 years old. At this time, visitors are not allowed in the infusion area.

## 2022-06-22 ENCOUNTER — Telehealth: Payer: Self-pay | Admitting: Hematology

## 2022-06-22 NOTE — Telephone Encounter (Signed)
Scheduled follow-up appointment per 7/6 los. Patient is aware.

## 2022-06-24 DIAGNOSIS — Z933 Colostomy status: Secondary | ICD-10-CM | POA: Diagnosis not present

## 2022-06-25 ENCOUNTER — Telehealth: Payer: Self-pay

## 2022-06-25 ENCOUNTER — Other Ambulatory Visit: Payer: Self-pay

## 2022-06-25 NOTE — Telephone Encounter (Signed)
Spoke with pt regarding CT Scan.  Pt stated the last time she was in clinic with Dr. Burr Medico, she was given contrast and told  by Dr. Burr Medico that someone from her office will contact her regarding scheduling her CT Scan.  Informed pt that this RN will check with Dr. Burr Medico.  Sent Dr. Burr Medico a Patient Call message.

## 2022-06-26 ENCOUNTER — Other Ambulatory Visit: Payer: Self-pay

## 2022-06-26 DIAGNOSIS — C20 Malignant neoplasm of rectum: Secondary | ICD-10-CM

## 2022-06-26 DIAGNOSIS — C799 Secondary malignant neoplasm of unspecified site: Secondary | ICD-10-CM

## 2022-06-26 NOTE — Progress Notes (Signed)
Per verbal order from Dr. Burr Medico, please order CT CAP w/contract for the pt to have completed by 07/13/2022 or 08/01/2022 at the latest.  Placed order and sent staff message to [employer] appt representative Caryl Comes) for Dr. Ernestina Penna group for financial clearance.  Awaiting pt's insurance approval.

## 2022-07-06 ENCOUNTER — Other Ambulatory Visit: Payer: Self-pay | Admitting: Hematology

## 2022-07-06 ENCOUNTER — Other Ambulatory Visit: Payer: Self-pay

## 2022-07-06 IMAGING — CT CT CHEST-ABD-PELV W/ CM
2 of 5 series · 12 of 36 positions shown, 14 images · IV contrast (omnipaque)
Comparison: CT abdomen/pelvis dated 08/27/2020

CLINICAL DATA: Rectosigmoid colon cancer, status post low anterior
resection with left mid abdominal colostomy. Now with newly
diagnosed adenocarcinoma at the appendiceal orifice.

EXAM:
CT CHEST, ABDOMEN, AND PELVIS WITH CONTRAST
TECHNIQUE: Multidetector CT imaging of the chest, abdomen and pelvis was
performed following the standard protocol during bolus
administration of intravenous contrast.
CONTRAST:  100mL OMNIPAQUE IOHEXOL 300 MG/ML  SOLN

[Series 2: cap with · axial · 0.85mm/px · z∈[+1075,+1575]mm · 9 of 126 slices shown, 11 images]
[im 13/126  mediastinal]
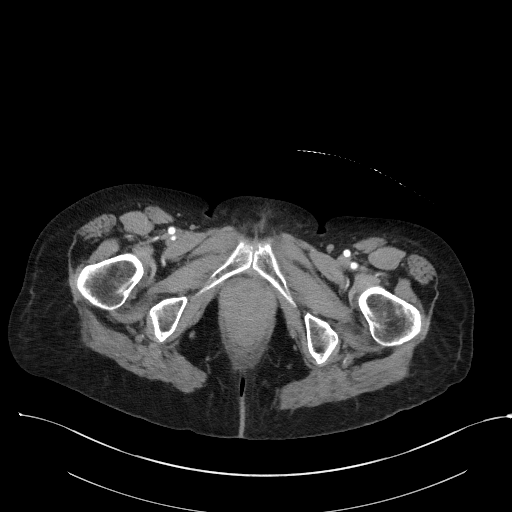
[im 13/126  bone]
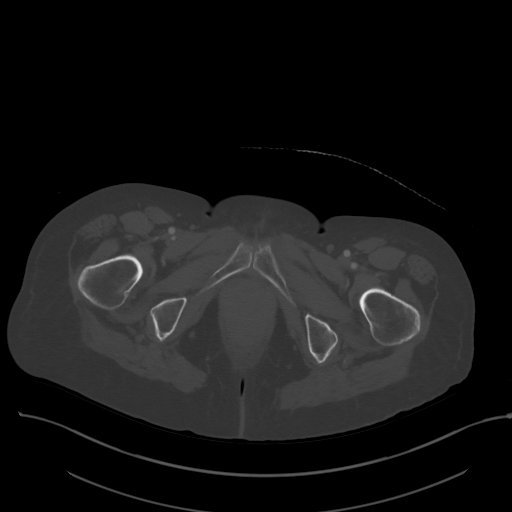
[im 26/126  mediastinal]
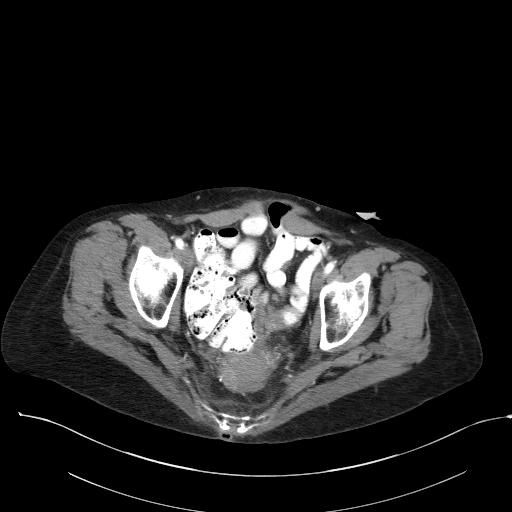
[im 38/126  mediastinal]
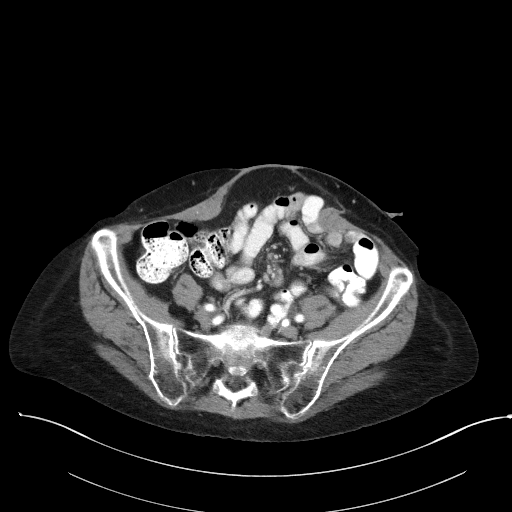
[im 51/126  mediastinal]
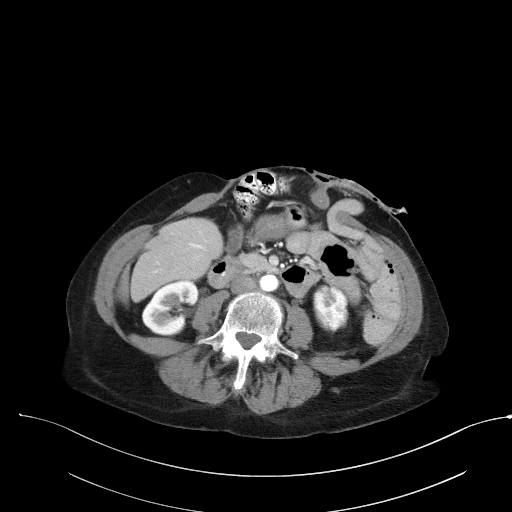
[im 63/126  mediastinal]
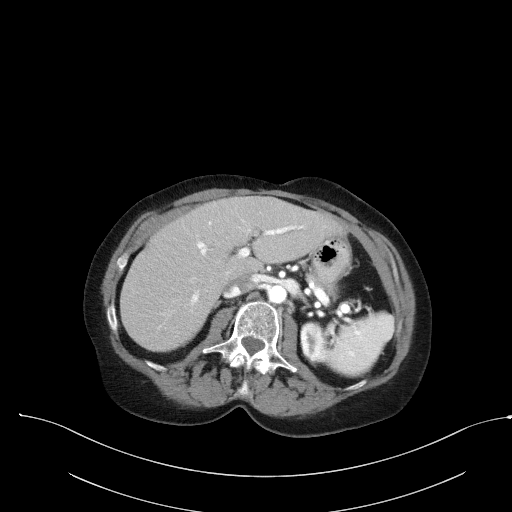
[im 76/126  mediastinal]
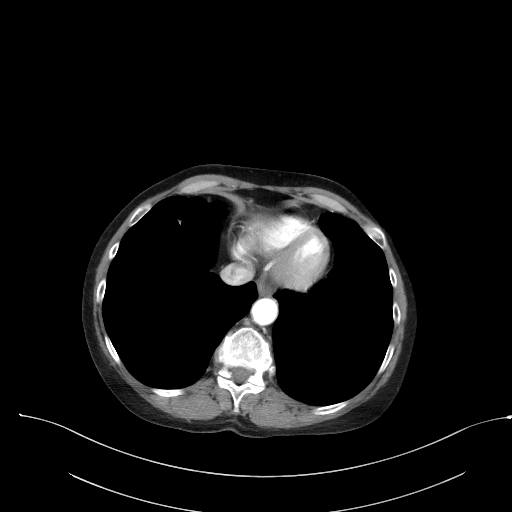
[im 88/126  mediastinal]
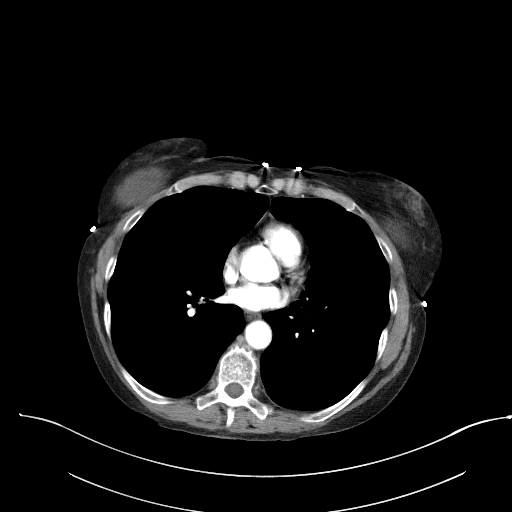
[im 101/126  mediastinal]
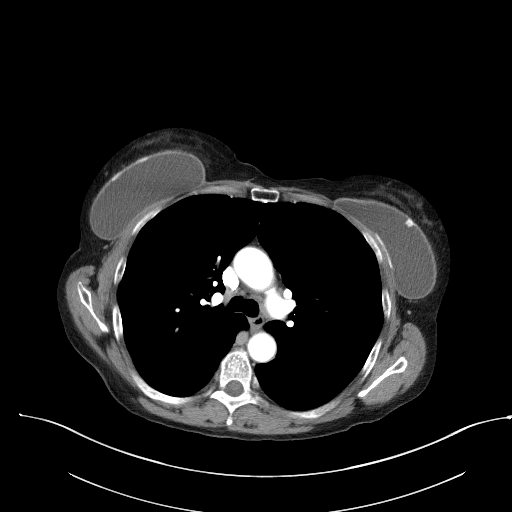
[im 113/126  mediastinal]
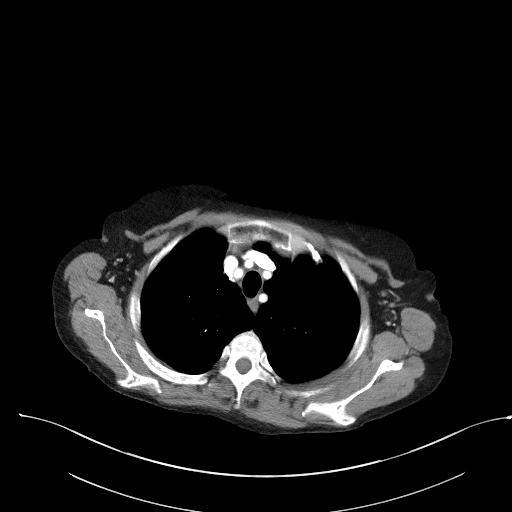
[im 113/126  bone]
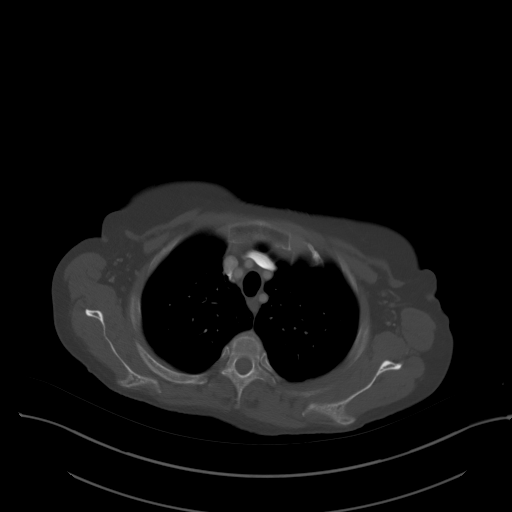

[Series 5: coronals · coronal · 0.69mm/px · 3 of 125 slices shown]
[im 25/125  mediastinal]
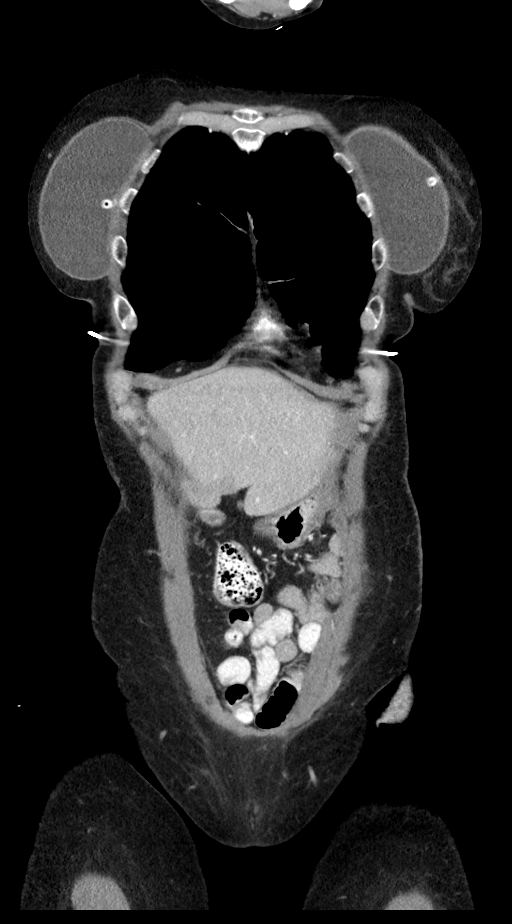
[im 50/125  mediastinal]
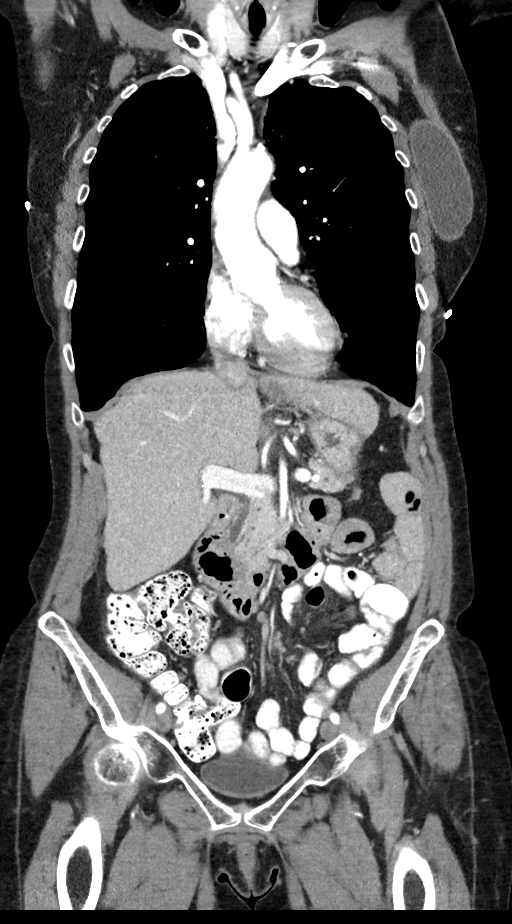
[im 75/125  mediastinal]
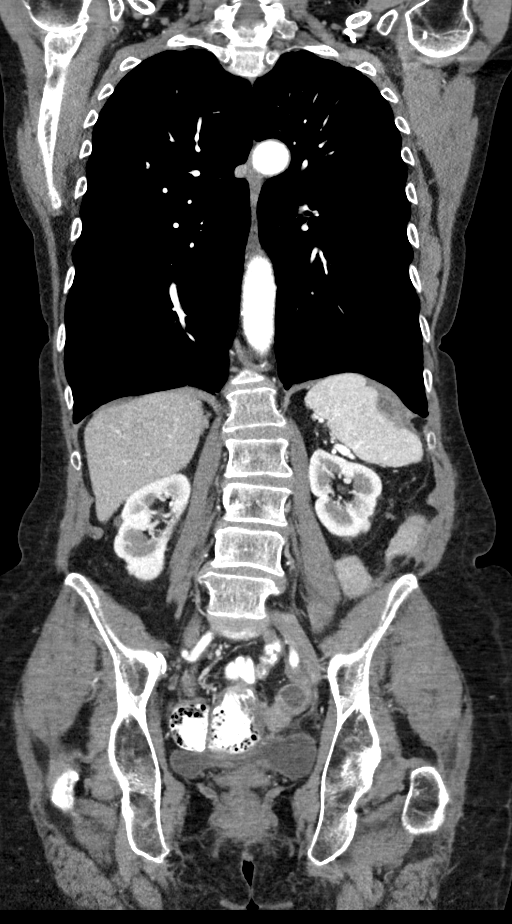

[12 of 36 positions shown; findings below may reference images not displayed]

FINDINGS: CT CHEST FINDINGS

Cardiovascular: The heart is normal in size. No pericardial
effusion.

No evidence of thoracic aortic aneurysm. Atherosclerotic
calcifications of the aortic arch.

Mediastinum/Nodes: No suspicious mediastinal lymphadenopathy.

Visualized thyroid is unremarkable.

Lungs/Pleura: Moderate centrilobular and paraseptal emphysematous
changes, upper lung predominant.

No suspicious pulmonary nodules.

Mild scarring/atelectasis in the right middle lobe and lingula.

No focal consolidation.

No pleural effusion or pneumothorax.

Musculoskeletal: Bilateral breast augmentation. Mild degenerative
changes of the lower thoracic spine.

CT ABDOMEN PELVIS FINDINGS

Hepatobiliary: Probable subcapsular cyst anteriorly in segment 4
(series 2/image 60).

Gallbladder is unremarkable. No intrahepatic or extrahepatic ductal
dilatation.

Pancreas: Within normal limits.

Spleen: Spleen is within normal limits. However, there is a 4.0 x
2.0 cm pericapsular lesion along the lateral spleen beneath the left
hemidiaphragm (series 2/image 58), previously 3.0 x 1.3 cm when
measured in a similar fashion, favored to be progressive.

Adrenals/Urinary Tract: Adrenal glands are within normal limits.

Kidneys are within normal limits.  No hydronephrosis.

Bladder is within normal limits.

Stomach/Bowel: Stomach is within normal limits.

Status post low anterior resection with left mid abdominal
colostomy.

2.5 x 5.0 cm soft tissue mass along the posterior aspect of the
cecum (series 2/image 98), essentially new from the prior, favored
to reflect a peritoneal/serosal implant.

Dilated mid/distal appendix in the left pelvis (series 2/image 97),
measuring up to 18 mm in diameter, new from the prior.

Vascular/Lymphatic: No evidence of abdominal aortic aneurysm.

Atherosclerotic calcifications of the abdominal aorta and branch
vessels.

No suspicious abdominopelvic lymphadenopathy.

Reproductive: Soft tissue mass along the posterior cecum noted above
abuts the anterior aspect of the uterus. Additional soft tissue
nodularity along the uterine fundus (series 2/image 94) and
posteriorly along the right cervix (series 2/image 101), favored to
reflect peritoneal implants in the pelvic cul-de-sac.

Mild soft tissue prominence along the right adnexa (series 2/image
97), progressive, also raising concern for a pelvic implant.

Other: No abdominopelvic ascites.

Diastasis of the midline anterior abdominal wall containing multiple
loops of small bowel and a loop of mid transverse colon, unchanged.

Musculoskeletal: Lumbar spine is within normal limits, noting grade
1 anterolisthesis of L4 on L5.
IMPRESSION: 5.0 cm soft tissue mass along the posterior aspect of the cecum at
the base of the appendix, favored to reflect a peritoneal/serosal
implant, corresponding to the patient's known adenocarcinoma. This
is new from the prior.

Suspected additional pelvic implants along the uterus and right
adnexa, poorly visualized, new/progressive from the prior.

Additional pericapsular lesion along the lateral spleen is mildly
progressive, suspicious for additional peritoneal implant.

No evidence of metastatic disease in the chest.

## 2022-07-10 MED FILL — Dexamethasone Sodium Phosphate Inj 100 MG/10ML: INTRAMUSCULAR | Qty: 1 | Status: AC

## 2022-07-11 ENCOUNTER — Other Ambulatory Visit: Payer: Self-pay | Admitting: Nurse Practitioner

## 2022-07-13 ENCOUNTER — Inpatient Hospital Stay: Payer: Medicare HMO

## 2022-07-13 ENCOUNTER — Other Ambulatory Visit: Payer: Self-pay

## 2022-07-13 ENCOUNTER — Inpatient Hospital Stay (HOSPITAL_BASED_OUTPATIENT_CLINIC_OR_DEPARTMENT_OTHER): Payer: Medicare HMO | Admitting: Hematology

## 2022-07-13 VITALS — BP 120/70 | HR 96 | Temp 97.6°F | Resp 18 | Ht 62.0 in | Wt 113.7 lb

## 2022-07-13 VITALS — BP 170/84 | HR 78 | Temp 98.9°F | Resp 18

## 2022-07-13 DIAGNOSIS — Z7901 Long term (current) use of anticoagulants: Secondary | ICD-10-CM | POA: Diagnosis not present

## 2022-07-13 DIAGNOSIS — C181 Malignant neoplasm of appendix: Secondary | ICD-10-CM | POA: Diagnosis not present

## 2022-07-13 DIAGNOSIS — J439 Emphysema, unspecified: Secondary | ICD-10-CM | POA: Diagnosis not present

## 2022-07-13 DIAGNOSIS — I10 Essential (primary) hypertension: Secondary | ICD-10-CM | POA: Diagnosis not present

## 2022-07-13 DIAGNOSIS — C786 Secondary malignant neoplasm of retroperitoneum and peritoneum: Secondary | ICD-10-CM | POA: Diagnosis not present

## 2022-07-13 DIAGNOSIS — C20 Malignant neoplasm of rectum: Secondary | ICD-10-CM

## 2022-07-13 DIAGNOSIS — R69 Illness, unspecified: Secondary | ICD-10-CM | POA: Diagnosis not present

## 2022-07-13 DIAGNOSIS — C799 Secondary malignant neoplasm of unspecified site: Secondary | ICD-10-CM

## 2022-07-13 DIAGNOSIS — Z933 Colostomy status: Secondary | ICD-10-CM | POA: Diagnosis not present

## 2022-07-13 DIAGNOSIS — R058 Other specified cough: Secondary | ICD-10-CM | POA: Diagnosis not present

## 2022-07-13 DIAGNOSIS — Z7952 Long term (current) use of systemic steroids: Secondary | ICD-10-CM | POA: Diagnosis not present

## 2022-07-13 DIAGNOSIS — Z79899 Other long term (current) drug therapy: Secondary | ICD-10-CM | POA: Diagnosis not present

## 2022-07-13 DIAGNOSIS — Z5111 Encounter for antineoplastic chemotherapy: Secondary | ICD-10-CM | POA: Diagnosis not present

## 2022-07-13 DIAGNOSIS — Z86711 Personal history of pulmonary embolism: Secondary | ICD-10-CM | POA: Diagnosis not present

## 2022-07-13 DIAGNOSIS — Z5112 Encounter for antineoplastic immunotherapy: Secondary | ICD-10-CM | POA: Diagnosis not present

## 2022-07-13 LAB — COMPREHENSIVE METABOLIC PANEL
ALT: 9 U/L (ref 0–44)
AST: 19 U/L (ref 15–41)
Albumin: 4.1 g/dL (ref 3.5–5.0)
Alkaline Phosphatase: 117 U/L (ref 38–126)
Anion gap: 10 (ref 5–15)
BUN: 18 mg/dL (ref 8–23)
CO2: 24 mmol/L (ref 22–32)
Calcium: 9.4 mg/dL (ref 8.9–10.3)
Chloride: 100 mmol/L (ref 98–111)
Creatinine, Ser: 0.92 mg/dL (ref 0.44–1.00)
GFR, Estimated: >60 mL/min (ref >60)
Glucose, Bld: 104 mg/dL — ABNORMAL HIGH (ref 70–99)
Potassium: 4.4 mmol/L (ref 3.5–5.1)
Sodium: 134 mmol/L — ABNORMAL LOW (ref 135–145)
Total Bilirubin: 0.9 mg/dL (ref 0.3–1.2)
Total Protein: 7.5 g/dL (ref 6.5–8.1)

## 2022-07-13 LAB — CBC WITH DIFFERENTIAL/PLATELET
Abs Immature Granulocytes: 0.03 10*3/uL (ref 0.00–0.07)
Basophils Absolute: 0.1 10*3/uL (ref 0.0–0.1)
Basophils Relative: 1 %
Eosinophils Absolute: 0.1 10*3/uL (ref 0.0–0.5)
Eosinophils Relative: 1 %
HCT: 36.5 % (ref 36.0–46.0)
Hemoglobin: 12.7 g/dL (ref 12.0–15.0)
Immature Granulocytes: 0 %
Lymphocytes Relative: 11 %
Lymphs Abs: 0.8 10*3/uL (ref 0.7–4.0)
MCH: 35.6 pg — ABNORMAL HIGH (ref 26.0–34.0)
MCHC: 34.8 g/dL (ref 30.0–36.0)
MCV: 102.2 fL — ABNORMAL HIGH (ref 80.0–100.0)
Monocytes Absolute: 0.7 10*3/uL (ref 0.1–1.0)
Monocytes Relative: 9 %
Neutro Abs: 5.7 10*3/uL (ref 1.7–7.7)
Neutrophils Relative %: 78 %
Platelets: 261 10*3/uL (ref 150–400)
RBC: 3.57 MIL/uL — ABNORMAL LOW (ref 3.87–5.11)
RDW: 15.4 % (ref 11.5–15.5)
WBC: 7.3 10*3/uL (ref 4.0–10.5)
nRBC: 0 % (ref 0.0–0.2)

## 2022-07-13 LAB — CEA (IN HOUSE-CHCC): CEA (CHCC-In House): 9.28 ng/mL — ABNORMAL HIGH (ref 0.00–5.00)

## 2022-07-13 LAB — TOTAL PROTEIN, URINE DIPSTICK: Protein, ur: 30 mg/dL — AB

## 2022-07-13 MED ORDER — PALONOSETRON HCL INJECTION 0.25 MG/5ML
0.2500 mg | Freq: Once | INTRAVENOUS | Status: AC
  Administered 2022-07-13: 0.25 mg via INTRAVENOUS
  Filled 2022-07-13: qty 5

## 2022-07-13 MED ORDER — CAPECITABINE 500 MG PO TABS: ORAL_TABLET | ORAL | 1 refills | Status: DC

## 2022-07-13 MED ORDER — HEPARIN SOD (PORK) LOCK FLUSH 100 UNIT/ML IV SOLN
500.0000 [IU] | Freq: Once | INTRAVENOUS | Status: AC | PRN
  Administered 2022-07-13: 500 [IU]

## 2022-07-13 MED ORDER — SODIUM CHLORIDE 0.9 % IV SOLN: Freq: Once | INTRAVENOUS | Status: AC

## 2022-07-13 MED ORDER — OXALIPLATIN CHEMO INJECTION 100 MG/20ML
50.0000 mg/m2 | Freq: Once | INTRAVENOUS | Status: AC
  Administered 2022-07-13: 80 mg via INTRAVENOUS
  Filled 2022-07-13: qty 16

## 2022-07-13 MED ORDER — SODIUM CHLORIDE 0.9 % IV SOLN
10.0000 mg | Freq: Once | INTRAVENOUS | Status: AC
  Administered 2022-07-13: 10 mg via INTRAVENOUS
  Filled 2022-07-13: qty 10

## 2022-07-13 MED ORDER — SODIUM CHLORIDE 0.9% FLUSH
10.0000 mL | Freq: Once | INTRAVENOUS | Status: AC
  Administered 2022-07-13: 10 mL

## 2022-07-13 MED ORDER — SODIUM CHLORIDE 0.9 % IV SOLN
400.0000 mg | Freq: Once | INTRAVENOUS | Status: AC
  Administered 2022-07-13: 400 mg via INTRAVENOUS
  Filled 2022-07-13: qty 16

## 2022-07-13 MED ORDER — SODIUM CHLORIDE 0.9% FLUSH
10.0000 mL | INTRAVENOUS | Status: DC | PRN
  Administered 2022-07-13: 10 mL

## 2022-07-13 MED ORDER — DEXTROSE 5 % IV SOLN: Freq: Once | INTRAVENOUS | Status: AC

## 2022-07-13 MED ORDER — CAPECITABINE 500 MG PO TABS: ORAL_TABLET | ORAL | 2 refills | Status: DC

## 2022-07-13 NOTE — Patient Instructions (Signed)
[facility]  Discharge Instructions: Thank you for choosing [facility] to provide your oncology and hematology care.   If you have a lab appointment with the [facility], please go directly to the [facility] and check in at the registration area.   Wear comfortable clothing and clothing appropriate for easy access to any Portacath or PICC line.   We strive to give you quality time with your provider. You may need to reschedule your appointment if you arrive late (15 or more minutes).  Arriving late affects you and other patients whose appointments are after yours.  Also, if you miss three or more appointments without notifying the office, you may be dismissed from the clinic at the provider's discretion.      For prescription refill requests, have your pharmacy contact our office and allow 72 hours for refills to be completed.    Today you received the following chemotherapy and/or immunotherapy agents Bevacizumab, Oxaliplatin      To help prevent nausea and vomiting after your treatment, we encourage you to take your nausea medication as directed.  BELOW ARE SYMPTOMS THAT SHOULD BE REPORTED IMMEDIATELY: *FEVER GREATER THAN 100.4 F (38 C) OR HIGHER *CHILLS OR SWEATING *NAUSEA AND VOMITING THAT IS NOT CONTROLLED WITH YOUR NAUSEA MEDICATION *UNUSUAL SHORTNESS OF BREATH *UNUSUAL BRUISING OR BLEEDING *URINARY PROBLEMS (pain or burning when urinating, or frequent urination) *BOWEL PROBLEMS (unusual diarrhea, constipation, pain near the anus) TENDERNESS IN MOUTH AND THROAT WITH OR WITHOUT PRESENCE OF ULCERS (sore throat, sores in mouth, or a toothache) UNUSUAL RASH, SWELLING OR PAIN  UNUSUAL VAGINAL DISCHARGE OR ITCHING   Items with * indicate a potential emergency and should be followed up as soon as possible or go to the Emergency Department if any problems should occur.  Please show the CHEMOTHERAPY ALERT CARD or IMMUNOTHERAPY ALERT CARD  at check-in to the Emergency Department and triage nurse.  Should you have questions after your visit or need to cancel or reschedule your appointment, please contact [facility]  Dept: [phone]  and follow the prompts.  Office hours are 8:00 a.m. to 4:30 p.m. Monday - Friday. Please note that voicemails left after 4:00 p.m. may not be returned until the following business day.  We are closed weekends and major holidays. You have access to a nurse at all times for urgent questions. Please call the main number to the clinic Dept: [phone] and follow the prompts.   For any non-urgent questions, you may also contact your provider using MyChart. We now offer e-Visits for anyone 14 and older to request care online for non-urgent symptoms. For details visit [email or web address].   Also download the MyChart app! Go to the app store, search "MyChart", open the app, select [facility], and log in with your MyChart username and password.  Masks are optional in the cancer centers. If you would like for your care team to wear a mask while they are taking care of you, please let them know. For doctor visits, patients may have with them one support person who is at least 69 years old. At this time, visitors are not allowed in the infusion area.

## 2022-07-13 NOTE — Progress Notes (Signed)
Patient had a high blood pressure post chemo today- asymptomatic- no CP, SOB or anything out of her normal. Patient declined to stay to inform MD. BP (!) 170/84 (BP Location: Right Arm, Patient Position: Sitting)   Pulse 78   Temp 98.9 F (37.2 C) (Oral)   Resp 18   LMP 12/14/2000   SpO2 98%

## 2022-07-13 NOTE — Progress Notes (Signed)
Tracy [name]   [phone] [phone]   Clinic Follow up Note   Patient Care Team: [name], DO as PCP - General (Family Medicine) [name], MD as Consulting Physician (Hematology) Icard, Octavio Graves, DO as Consulting Physician (Pulmonary Disease) [place], P.A. [name], MD as Consulting Physician (General Surgery) [name], MD as Consulting Physician (General Surgery) [name], [name], MD as Consulting Physician (Gastroenterology)  Date of Service:  07/13/2022  CHIEF COMPLAINT: f/u of colon cancer  CURRENT THERAPY:  CAPOX -Xeloda started 02/01/21, currently 1500 mg BID 7 days on/7 days off -Bevacizumab q2-3 weeks added with C2 02/24/21             -Beva held 09/02/21-12/28/21 d/t PE -Oxaliplatin added with C4 03/24/21, currently q3weeks  ASSESSMENT & PLAN:  Tracy [name] is a 71 y.o. female with   1. [name], MMR normal, peritoneal metastasis in 01/2021 -diagnosed 02/2020, s/p emergent surgery for perforation. Path showed invasive adenocarcinoma with perforation, +3/15 LNs, and clear margins. Initial CT scan negative for distant metastasis. She was not a candidate for adjuvant chemo due to very slow recovery from surgery -Guardant Reveal ctDNA were negative (06/06/20, 07/12/20, and 09/10/20). -surveillance colonoscopy by Dr. Havery Moros 01/06/21 showed new adenocarcinoma at appendiceal orifice. Biopsy confirmed adenocarcinoma -01/27/21 PET showed tumor in appendiceal orifice is likely peritoneal metastasis growing into the cecum.  Her peritoneal implants are mainly in the pelvis, difficult to biopsy -She began Xeloda on 02/01/21. Cycle 2 was changed to 1 week on/1 week off, and beva was added. She tolerates this well -PS improved and low dose oxaliplatin was added with C4, now on Capeox/beva q3 weeks, with continuing Xeloda 1 week on/1 week off. -Beva was held for PE found on CT  08/2021, she is on xarelto. Resumed beva q3 weeks 12/29/21 -restaging CT CAP on 04/09/22 was stable, with no new or progressive findings. She is scheduled for repeat on 07/23/22, with f/u phone visit 8/14 to review results. -labs reviewed, overall stable, urine protein 30, adequate to proceed with beva and oxali today  -we discussed the possible option of surgery today. I explained I do not know if she is a candidate and that even if she is, it's a very big surgery. I also discussed she will still be at risk for recurrence. If her CT scan is good, I offered to refer her to Dr. Crisoforo Oxford.   2. Productive Cough -she completed a cycle of doxycycline/prednisone in 03/2022 for a possible sinus infection following allergies. She initially noted improvement. -CT CAP on 04/09/22 was stable. Chest x-ray on 05/27/22 showed blunting of costophrenic angles may represent pleural thickening versus small pleural effusions. -she reports allergies with intermittent phlegm production.   3. HTN -secondary to Beva -she has an intolerance listed for amlodipine. She started losartan 53m daily on 03/02/22.   4. H/o Bilateral PE -incidental finding on CT 08/14/21 -no hypoxia, stable respiratory status. She began xarelto 08/15/21 -Beva was held, CT 12/25/21 was clear, Beva resumed 12/29/21   6. Comorbidities: Asthma, COPD, anxiety -on trelegy, Claritin, flonase, and albuterol for her COPD/asthma per Dr. IValeta Harms Uses albuterol nebulizer for exacerbation. -h/o Covid and URI 05/2021 -f/u pulmonology and PCP.      PLAN: -proceed with Beva and oxali at same dose today  -will start next cycle Xeloda 8/7 -CT 8/10, with phone visit to review results 8/14 -lab, flush, f/u, and oxali/beva in 3 and 6 weeks  No problem-specific Assessment & Plan notes found for this encounter.   SUMMARY OF ONCOLOGIC HISTORY: Oncology History Overview Note  Cancer Staging Rectal cancer [facility]) Staging form: Colon and Rectum, AJCC 8th Edition -  Pathologic stage from 03/03/2020: Stage IIIB (pT4a, pN1b, cM0) - Signed by [name], MD on 01/28/2021 Stage prefix: Initial diagnosis Histologic grading system: 4 grade system Histologic grade (G): G2 Residual tumor (R): R0 - None    Rectal cancer ([place])  03/03/2020 - 04/05/2020 Hospital Admission   She was admitted to ED on 03/03/20 for abdominal pain and nausea. She had been having diarrhea for several weeks. During hospital stay she developed acute respiratory failure, AKI, RUE DVT from PICC line. Work up showed bowel perforation, small left liver mass and thickening of jejunum. She underwent emergent surgery on 03/03/20 for resection and colostomy placement. Her path showed invasive cancer, metastatic to 3/15 LNs. She had a NGT in placed but this was removed on POD 3. Post op her stoma became necrotic and septic. She had another bowel surgery on 03/12/20, which was NED with necrotic tissue.    03/03/2020 Imaging   CT AP 03/03/20  IMPRESSION: 1. Free intraperitoneal air consistent perforation. Most likely site of perforation is in the distal splenic flexure/proximal descending colon where there is a collection of air and gas measuring 6 centimeters. No obvious soft tissue mass identified in this region. At this site, there is abrupt transition of dilated, stool-filled colon to completely decompressed proximal descending colon. 2. Thickened, inflamed loops of jejunum are identified within the pelvis and are likely reactive. 3. Small hiatal hernia. 4. Benign-appearing 1.6 centimeter mass the LEFT hepatic lobe. Recommend comparison with prior studies if available. 5.  Emphysema (ICD10-J43.9). 6.  Aortic Atherosclerosis (ICD10-I70.0). 7. Bilateral renal scarring.   03/03/2020 Surgery   low anterior resection end colostomy by Dr Marcello Moores    03/03/2020 Initial Biopsy   FINAL MICROSCOPIC DIAGNOSIS: 03/03/20 A. RECTOSIGMOID COLON, LOW ANTERIOR RESECTION:  - Invasive colonic adenocarcinoma, 3.5 cm.   - Tumor invades the visceral peritoneum.  - Margins of resection are not involved.  - Metastatic carcinoma in (3) of (15) lymph nodes.  - See oncology table.   B. ADDITIONAL SIGMOID COLON, RESECTION:  - Colonic tissue, negative for carcinoma.  ADDENDUM:  Mismatch Repair Protein (IHC)   SUMMARY INTERPRETATION: NORMAL  There is preserved expression of the major MMR proteins. There is a very  low probability that microsatellite instability (MSI) is present.  However, certain clinically significant MMR protein mutations may result  in preservation of nuclear expression. It is recommended that the  preservation of protein expression be correlated with molecular based  MSI testing.   IHC EXPRESSION RESULTS  TEST           RESULT  MLH1:          Preserved nuclear expression  MSH2:          Preserved nuclear expression  MSH6:          Preserved nuclear expression  PMS2:          Preserved nuclear expression   03/03/2020 Cancer Staging   Staging form: Colon and Rectum, AJCC 8th Edition - Pathologic stage from 03/03/2020: Stage IIIB (pT4a, pN1b, cM0) - Signed by [name], MD on 01/28/2021 Stage prefix: Initial diagnosis Histologic grading system: 4 grade system Histologic grade (G): G2 Residual tumor (R): R0 - None   03/08/2020 Imaging   CT AP 03/08/20 IMPRESSION: 1. Interval midline laparotomy with  distal colon resection and diverting left lower quadrant colostomy. 2. Diffuse small bowel dilatation with gas fluid levels most consistent with postoperative ileus. 3. Trace ascites within the abdomen and pelvis. No fluid collection or abscess at this time. Surgical drain within the lower pelvis. 4. Indeterminate 1.4 cm subcapsular liver hypodensity. In light of newly diagnosed rectal cancer, metastatic disease cannot be excluded. PET CT may be useful for further evaluation. 5. Interval development of trace bilateral pleural effusions and diffuse body wall edema.   03/12/2020 Surgery    EXPLORATORY LAPAROTOMY WITH BOWEL RESECTION AND COLOSTOMY by Dr Hassell Done 03/12/20  FINAL MICROSCOPIC DIAGNOSIS: 03/12/20  A. COLON, SPLENIC FLEXURE, RESECTION:  - Segment of colon (37 cm) with perforation and associated inflammation  - Multiple mucosal ulcers with necrotizing inflammation  - No evidence of malignancy    03/12/2020 Imaging   CT AP WO contrast 03/12/20  IMPRESSION: 1. Exam is limited by lack of intravenous contrast, motion, and artifact from the patient's arms adjacent to the torso. Since 03/08/2020, there has been development of relatively large pockets of intraperitoneal free air. While intraperitoneal gas would not be unexpected on postoperative day 9, this gas is new since an intervening study of 03/08/2020 and given the relatively large volume certainly raises concern for bowel perforation. No source for the intraperitoneal free air is evident on this study. There is a surgical drain in the pelvis, but there is no free gas around the drain itself to suggest that it represents the source. 2. New circumferential wall thickening in the splenic flexure, descending colon and sigmoid colon leading into the end colostomy. Infection/inflammation would be a consideration. Ischemia cannot be excluded. 3. Relatively small volume intraperitoneal free fluid. High attenuation small fluid collections in the left upper abdomen may reflect hemorrhage, infection or residua from prior perforation. 4. Interval progression of diffuse body wall edema. 5. Residual contrast material in the renal parenchyma from prior imaging, compatible with renal dysfunction.    03/19/2020 Imaging   CT CAP 03/19/20  IMPRESSION: 1. 5.5 x 3.2 cm fluid collection is noted along the greater curvature of the proximal stomach. 2. Surgical drain is again noted in the pelvis with tip in left lower quadrant. 3. Interval development of crescent-shaped fluid collection measuring 16.4 x 3.3 cm in the epigastric  region and left upper quadrant of the abdomen which may extend into the left lower quadrant. Potentially this may represent abscess or developing abscess. Multiple other smaller fluid collections are noted which may represent small abscesses. 4. 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 5. Mild amount of free fluid is noted in the posterior pelvis. 6. Moderate anasarca is noted. Aortic Atherosclerosis (ICD10-I70.0).   04/12/2020 Imaging   CT AP 04/12/20  IMPRESSION: 1. Fluid collections within the LEFT abdomen have significantly decreased compared to previous CT exams, now nearly completely resolved. 2. Percutaneous drainage catheter with tip coiled posterior to the LEFT kidney, stable positioning compared to the previous study. The more anterior catheter has been removed. 3. Small amount of free fluid persists within the abdomen and pelvis. 4. Trace bibasilar pleural effusions. 5. Anasarca. 6. While reviewing today's study, comparing with a chest/abdomen/pelvis CT from earlier same month, there is question of thrombus in the LEFT internal jugular vein. Recommend ultrasound of the LEFT IJ to exclude DVT. This recommendation discussed with patient's hospitalist, Dr. Owens Shark, on 04/12/2020 at 4:20 p.m.   Emphysema (ICD10-J43.9).   04/22/2020 Imaging   CT AP 04/22/20  IMPRESSION: 1. No recurrent  intra-abdominal abscess status post interval removal of left retroperitoneal percutaneous drain. Stable small amount of free pelvic fluid. 2. Enlarging dependent bilateral pleural effusions, now moderate in volume. Associated atelectasis at both lung bases. 3. Progressive anasarca with generalized edema throughout the subcutaneous fat. 4. Stable small subcapsular fluid collection along the anterior aspect of the left hepatic lobe. 5. Aortic Atherosclerosis (ICD10-I70.0).   05/23/2020 Initial Diagnosis   Rectal cancer ([place])   01/14/2021 Imaging   CT CAP IMPRESSION: -5.0 cm soft  tissue mass along the posterior aspect of the cecum at the base of the appendix, favored to reflect a peritoneal/serosal implant, corresponding to the patient's known adenocarcinoma. This is new from the prior. -Suspected additional pelvic implants along the uterus and right adnexa, poorly visualized, new/progressive from the prior. -Additional pericapsular lesion along the lateral spleen is mildly progressive, suspicious for additional peritoneal implant. -No evidence of metastatic disease in the chest.   01/27/2021 PET scan   IMPRESSION:  1. Extensive hypermetabolic peritoneal metastasis, as detailed  above.  2. No evidence of hypermetabolic supradiaphragmatic disease.  3. Diffuse low-level thyroid hypermetabolism can be seen in the  setting of thyroiditis. Consider correlation with thyroid function  labs.  4. Aortic atherosclerosis (ICD10-I70.0) and emphysema (ICD10-J43.9).    02/01/2021 -  Chemotherapy    First-line chemo Xeloda 1500 mg twice daily for day 1-14, every 21 days, started on 02/01/2021. Reduced to 1 week on/ 1 week off and added bevacizumab q2weeks from C2 on 02/24/21.  ----Given good tolerance, I changed to CAPOX and bevacizumab q2weeks from C4 on 03/24/21 with Xeloda 1533m in the AM and 10046min the PM 1 week on/1 week off.     05/08/2021 Imaging   PET  IMPRESSION: 1. There is been interval development of several FDG avid pulmonary nodules within both lungs. Additionally, there is new FDG avid low left paratracheal lymph node. Thoracic metastasis cannot be excluded. 2. Interval improvement in multifocal FDG avid peritoneal metastasis as detailed above.     08/14/2021 Imaging   CT CAP  IMPRESSION: Resolution of previously seen small bilateral pulmonary nodules since previous study.   Stable 8 mm mediastinal lymph node in the AP window.   No evidence of metastatic disease within the abdomen or pelvis.   Nonocclusive pulmonary embolism in bilateral central  lower lobe pulmonary arteries, which appears subacute in age.   12/25/2021 Imaging   EXAM: CT CHEST, ABDOMEN, AND PELVIS WITH CONTRAST  IMPRESSION: 1. Minimal, bandlike residua of previously seen FDG avid pulmonary nodules, consistent with treated metastases. No new pulmonary nodules. 2. Unchanged post treatment appearance of the cecal base, with soft tissue thickening about the cecal base and adjacent pelvis, previously FDG avid and in keeping with known appendiceal orifice malignancy. 3. Unchanged central fluid attenuation lesions about the spleen, left iliopsoas, and left pelvic sidewall, previously FDG avid, consistent with treated peritoneal implants. 4. No evidence of new metastatic disease in the chest, abdomen, or pelvis 5. Status post rectosigmoid colon resection with left lower quadrant end colostomy.   Aortic Atherosclerosis (ICD10-I70.0).   [place] 01/06/2021 Procedure   (surveillance colonoscopy for h/o rectal cancer) Impression:  - Preparation of the colon was fair, lavage performed with mostly adequate views. - Polypoid lesion obliterating appendiceal orifice. Biopsied to evaluate for malignancy. - The examined portion of the ileum was normal. - Two large polyps in the cecum, not removed today pending path at appendiceal orifice, bowel prep. If removed endoscopically in the  future would favor EMR to be done at the hospital. - One 5 mm polyp at the ileocecal valve, removed with a cold snare. Resected and retrieved. - One 3 mm polyp in the transverse colon, removed with a cold snare. Resected and retrieved. - Parastomal hernia making initial entry to the distal colon challenging. - The examination was otherwise normal.   01/06/2021 Initial Biopsy   Diagnosis 1. Colon, biopsy, appendiceal oraface - ADENOCARCINOMA. 2. Colon, polyp(s), ileocecal valve, transverse, x2 - TUBULAR ADENOMA, NEGATIVE FOR HIGH GRADE DYSPLASIA (X1). - COLONIC MUCOSA  WITH UNDERLYING LYMPHOID AGGREGATE, NEGATIVE FOR DYSPLASIA (X1).  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 This is new from the prior. -Suspected additional pelvic implants along the uterus and right adnexa, poorly visualized, new/progressive from the prior. -Additional pericapsular lesion along the lateral spleen is mildly progressive, suspicious for additional peritoneal implant. -No evidence of metastatic disease in the chest.   08/14/2021 Imaging   CT CAP IMPRESSION: Resolution of previously seen small bilateral pulmonary nodules since previous study. Stable 8 mm mediastinal lymph node in the AP window. No evidence of metastatic disease within the abdomen or pelvis. Nonocclusive pulmonary embolism in bilateral central lower lobe pulmonary arteries, which appears subacute in age.  Aortic Atherosclerosis (ICD10-I70.0) and Emphysema (ICD10-J43.9).     08/14/2021 Imaging   CT CAP  IMPRESSION: Resolution of previously seen small bilateral pulmonary nodules since previous study.   Stable 8 mm mediastinal lymph node in the AP window.   No evidence of metastatic disease within the abdomen or pelvis.   Nonocclusive pulmonary embolism in bilateral central lower lobe pulmonary arteries, which appears subacute in age.   12/25/2021 Imaging   EXAM: CT CHEST, ABDOMEN, AND PELVIS WITH CONTRAST  IMPRESSION: 1. Minimal, bandlike residua of previously seen FDG avid pulmonary nodules, consistent with treated metastases. No new pulmonary  nodules. 2. Unchanged post treatment appearance of the cecal base, with soft tissue thickening about the cecal base and adjacent pelvis, previously FDG avid and in keeping with known appendiceal orifice malignancy. 3. Unchanged central fluid attenuation lesions about the spleen, left iliopsoas, and left pelvic sidewall, previously FDG avid, consistent with treated peritoneal implants. 4. No evidence of new metastatic disease in the chest, abdomen, or pelvis 5. Status post rectosigmoid colon resection with left lower quadrant end colostomy.   Aortic Atherosclerosis (ICD10-I70.0).      INTERVAL HISTORY:  Tracy [name] is here for a follow up of colon cancer. She was last seen by me on 06/18/22. She presents to the clinic alone. She reports she is dealing with allergies. She reports occasional phlegm production but not consistently. She notes her husband is sick, and she hopes she did not catch what he had (she adds he is on medication).   All other systems were reviewed with the patient and are negative.  MEDICAL HISTORY:  Past Medical History:  Diagnosis Date   Acute deep vein thrombosis (DVT) of brachial vein of right upper extremity (HCC)    Acute on chronic respiratory failure with  hypoxia ([place])    Asthma    Cataract    CHICKENPOX, HX OF 01/06/2011   Qualifier: Diagnosis of  By: Charlett Blake MD, Stacey     Colon cancer [facility])    COPD (chronic obstructive pulmonary disease) ([place]) 2011   FeV1 31% predicted FeV1/FVX 47 %   COVID-19 05/2021   Essential hypertension 05/23/2020   Hemorrhoid    Open right radial fracture 2020   Osteopenia    Perforated sigmoid colon ([place]) 03/03/2020   Pleural effusion    Pneumonia 2021   Postoperative intra-abdominal abscess 2021   Rectal cancer (HCC)    Seasonal allergies    takes Claritin daily prn   Shock circulatory ([place])    Tracheostomy status ([place])    Vitamin D deficiency    takes Vit d every 14 days    SURGICAL HISTORY: Past Surgical  History:  Procedure Laterality Date   AUGMENTATION MAMMAPLASTY     saline   EXAMINATION UNDER ANESTHESIA  10/14/2012   Procedure: EXAM UNDER ANESTHESIA;  Surgeon: Gayland Curry, MD,FACS;  Location: [facility];  Service: General;  Laterality: N/A;  rectal exam under anesthesia excisional hemorrhoidectomy hemorrhoidal banding x two   EXAMINATION UNDER ANESTHESIA  02/03/2013   excision hemorrhoidal tissue   FOOT SURGERY     left bunionectomy   HEMORRHOIDECTOMY WITH HEMORRHOID BANDING  10/14/2012   Procedure: HEMORRHOIDECTOMY WITH HEMORRHOID BANDING;  Surgeon: Gayland Curry, MD,FACS;  Location: [facility];  Service: General;  Laterality: N/A;   IR IMAGING GUIDED PORT INSERTION  02/03/2021   IR THORACENTESIS ASP PLEURAL SPACE W/IMG GUIDE  04/23/2020   LAPAROTOMY N/A 03/03/2020   Procedure: low anterior resection end colostomy;  Surgeon: [name], MD;  Location: [facility];  Service: General;  Laterality: N/A;   LAPAROTOMY N/A 03/12/2020   Procedure: EXPLORATORY LAPAROTOMY WITH BOWEL RESECTION AND COLOSTOMY;  Surgeon: [name], MD;  Location: [facility];  Service: General;  Laterality: N/A;   OPEN REDUCTION INTERNAL FIXATION (ORIF) DISTAL RADIAL FRACTURE Right 11/01/2018   Procedure: OPEN REDUCTION INTERNAL FIXATION (ORIF) DISTAL RADIAL FRACTURE;  Surgeon: [name], MD;  Location: [facility];  Service: Orthopedics;  Laterality: Right;   TONSILLECTOMY  1960   recurrent otitis media   US ECHOCARDIOGRAPHY  02/2020    poor windows, normal LV function, severely dilated RV with moderately reduced function, RV volume and pressure overload, mildly dilated RA    I have reviewed the social history and family history with the patient and they are unchanged from previous note.  ALLERGIES:  is allergic to amlodipine and codeine.  MEDICATIONS:  Current Outpatient Medications  Medication Sig Dispense Refill   albuterol (PROVENTIL) (2.5 MG/3ML) 0.083% nebulizer solution Take 3 mLs (2.5 mg total) by  nebulization every 4 (four) hours as needed for wheezing or shortness of breath. DX: J44.9 360 mL 5   albuterol (VENTOLIN HFA) 108 (90 Base) MCG/ACT inhaler Inhale 2 puffs into the lungs every 6 (six) hours as needed for wheezing or shortness of breath. 8 g 5   ALPRAZolam (XANAX) 0.25 MG tablet Take 1 tablet (0.25 mg total) by mouth at bedtime as needed for anxiety. 30 tablet 0   capecitabine (XELODA) 500 MG tablet Take 3 tablets every 12 hours. Take for 7 days on, 7 days off, repeat every 14 days. 84 tablet 1   docusate sodium (COLACE) 50 MG capsule Take 50 mg by mouth daily as needed for mild constipation.     doxycycline (VIBRA-TABS) 100 MG  tablet Take 1 tablet (100 mg total) by mouth 2 (two) times daily. 14 tablet 0   fluticasone (FLONASE) 50 MCG/ACT nasal spray Place 2 sprays into both nostrils daily. 16 mL 5   Fluticasone-Umeclidin-Vilant (TRELEGY ELLIPTA) 100-62.5-25 MCG/ACT AEPB Inhale 1 puff into the lungs daily. 90 each 3   Fluticasone-Umeclidin-Vilant (TRELEGY ELLIPTA) 100-62.5-25 MCG/INH AEPB Inhale 1 puff into the lungs daily. 60 each 5   HYDROcodone bit-homatropine (HYCODAN) 5-1.5 MG/5ML syrup Take 5 mLs by mouth every 6 (six) hours as needed for cough. 240 mL 0   lidocaine-prilocaine (EMLA) cream Apply 1 application. topically as needed. 30 g 2   loratadine (CLARITIN) 10 MG tablet Take 1 tablet (10 mg total) by mouth daily. 90 tablet 3   losartan (COZAAR) 25 MG tablet TAKE 1 TABLET (25 MG TOTAL) BY MOUTH DAILY. 30 tablet 1   ondansetron (ZOFRAN) 8 MG tablet Take 1 tablet (8 mg total) by mouth every 8 (eight) hours as needed for nausea or vomiting. 20 tablet 2   predniSONE (DELTASONE) 10 MG tablet Take 4 tabs by mouth once daily x4 days, then 3 tabs x4 days, 2 tabs x4 days, 1 tab x4 days and stop. 40 tablet 0   prochlorperazine (COMPAZINE) 10 MG tablet Take 1 tablet (10 mg total) by mouth every 6 (six) hours as needed for nausea or vomiting. 30 tablet 2   XARELTO 20 MG TABS tablet TAKE  1 TABLET BY MOUTH DAILY WITH SUPPER 30 tablet 0   No current facility-administered medications for this visit.    PHYSICAL EXAMINATION: ECOG PERFORMANCE STATUS: {CHL ONC ECOG PS:[phone]}  There were no vitals filed for this visit. Wt Readings from Last 3 Encounters:  06/18/22 115 lb 8 oz (52.4 kg)  06/08/22 116 lb (52.6 kg)  05/25/22 116 lb 8 oz (52.8 kg)     GENERAL:alert, no distress and comfortable SKIN: skin color, texture, turgor are normal, no rashes or significant lesions EYES: normal, Conjunctiva are pink and non-injected, sclera clear  LUNGS: clear to auscultation and percussion with normal breathing effort HEART: regular rate & rhythm and no murmurs and no lower extremity edema Musculoskeletal:no cyanosis of digits and no clubbing  NEURO: alert & oriented x 3 with fluent speech, no focal motor/sensory deficits  LABORATORY DATA:  I have reviewed the data as listed    Latest Ref Rng & Units 07/13/2022   10:07 AM 06/18/2022    9:31 AM 05/25/2022    9:21 AM  CBC  WBC 4.0 - 10.5 K/uL 7.3  6.0  7.1   Hemoglobin 12.0 - 15.0 g/dL 12.7  12.2  12.8   Hematocrit 36.0 - 46.0 % 36.5  35.4  38.0   Platelets 150 - 400 K/uL 261  259  295         Latest Ref Rng & Units 07/13/2022   10:07 AM 06/18/2022    9:31 AM 05/25/2022    9:21 AM  CMP  Glucose 70 - 99 mg/dL 104  111  100   BUN 8 - 23 mg/dL 18  16  13    Creatinine 0.44 - 1.00 mg/dL 0.92  0.85  0.97   Sodium 135 - 145 mmol/L 134  136  136   Potassium 3.5 - 5.1 mmol/L 4.4  3.7  4.2   Chloride 98 - 111 mmol/L 100  102  102   CO2 22 - 32 mmol/L 24  26  27    Calcium 8.9 - 10.3 mg/dL 9.4  9.3  9.7  Total Protein 6.5 - 8.1 g/dL 7.5  7.1  7.0   Total Bilirubin 0.3 - 1.2 mg/dL 0.9  0.7  0.5   Alkaline Phos 38 - 126 U/L 117  111  111   AST 15 - 41 U/L 19  16  19    ALT 0 - 44 U/L 9  8  10        RADIOGRAPHIC STUDIES: I have personally reviewed the radiological images as listed and agreed with the findings in the report. No  results found.    No orders of the defined types were placed in this encounter.  All questions were answered. The patient knows to call the clinic with any problems, questions or concerns. No barriers to learning was detected. The total time spent in the appointment was {CHL ONC TIME VISIT - ZFPOI:[id number]}.     Aurea Graff 07/13/2022   I, Wilburn Mylar, am acting as scribe for [name], MD.   {Add scribe attestation statement}

## 2022-07-14 ENCOUNTER — Encounter: Payer: Self-pay | Admitting: Hematology

## 2022-07-16 ENCOUNTER — Other Ambulatory Visit: Payer: Self-pay

## 2022-07-17 ENCOUNTER — Ambulatory Visit (HOSPITAL_COMMUNITY): Payer: Medicare HMO

## 2022-07-22 ENCOUNTER — Ambulatory Visit: Payer: Medicare HMO | Admitting: Pulmonary Disease

## 2022-07-22 ENCOUNTER — Emergency Department (HOSPITAL_COMMUNITY): Payer: Medicare HMO

## 2022-07-22 ENCOUNTER — Emergency Department (HOSPITAL_COMMUNITY)
Admission: EM | Admit: 2022-07-22 | Discharge: 2022-07-22 | Disposition: A | Discharge status: Home / Self Care | Payer: Medicare HMO | Attending: Emergency Medicine | Admitting: Emergency Medicine

## 2022-07-22 ENCOUNTER — Telehealth: Payer: Self-pay | Admitting: Hematology

## 2022-07-22 ENCOUNTER — Encounter (HOSPITAL_COMMUNITY): Payer: Self-pay

## 2022-07-22 DIAGNOSIS — M7989 Other specified soft tissue disorders: Secondary | ICD-10-CM | POA: Diagnosis not present

## 2022-07-22 DIAGNOSIS — M25571 Pain in right ankle and joints of right foot: Secondary | ICD-10-CM | POA: Diagnosis not present

## 2022-07-22 DIAGNOSIS — M25531 Pain in right wrist: Secondary | ICD-10-CM | POA: Diagnosis not present

## 2022-07-22 DIAGNOSIS — S92354A Nondisplaced fracture of fifth metatarsal bone, right foot, initial encounter for closed fracture: Secondary | ICD-10-CM | POA: Diagnosis not present

## 2022-07-22 DIAGNOSIS — S99921A Unspecified injury of right foot, initial encounter: Secondary | ICD-10-CM | POA: Diagnosis present

## 2022-07-22 DIAGNOSIS — M79671 Pain in right foot: Secondary | ICD-10-CM | POA: Diagnosis not present

## 2022-07-22 DIAGNOSIS — M25511 Pain in right shoulder: Secondary | ICD-10-CM | POA: Diagnosis not present

## 2022-07-22 DIAGNOSIS — Z7901 Long term (current) use of anticoagulants: Secondary | ICD-10-CM | POA: Diagnosis not present

## 2022-07-22 DIAGNOSIS — W010XXA Fall on same level from slipping, tripping and stumbling without subsequent striking against object, initial encounter: Secondary | ICD-10-CM | POA: Insufficient documentation

## 2022-07-22 DIAGNOSIS — M79641 Pain in right hand: Secondary | ICD-10-CM | POA: Insufficient documentation

## 2022-07-22 MED ORDER — OXYCODONE HCL 5 MG PO TABS: 5.0000 mg | ORAL_TABLET | Freq: Four times a day (QID) | ORAL | 0 refills | Status: AC | PRN

## 2022-07-22 MED ORDER — IBUPROFEN 200 MG PO TABS
600.0000 mg | ORAL_TABLET | Freq: Once | ORAL | Status: AC
  Administered 2022-07-22: 600 mg via ORAL
  Filled 2022-07-22: qty 3

## 2022-07-22 MED ORDER — ACETAMINOPHEN 325 MG PO TABS: 650.0000 mg | ORAL_TABLET | Freq: Four times a day (QID) | ORAL | 0 refills | Status: DC | PRN

## 2022-07-22 MED ORDER — OXYCODONE HCL 5 MG PO TABS
5.0000 mg | ORAL_TABLET | Freq: Once | ORAL | Status: AC
  Administered 2022-07-22: 5 mg via ORAL
  Filled 2022-07-22: qty 1

## 2022-07-22 NOTE — Discharge Instructions (Signed)
Next, elevate foot, ice, Tylenol as needed for mild pain.  Narcotic sent to pharmacy for severe pain.  Originally told you to take Motrin however I do not want you taking that long-term due to currently being on a blood thinner.

## 2022-07-22 NOTE — Telephone Encounter (Signed)
Rescheduled upcoming appointment due to provider's PAL. Patient is aware of changes. ?

## 2022-07-22 NOTE — ED Provider Triage Note (Signed)
Emergency Medicine Provider Triage Evaluation Note  Tracy [name] , a 71 y.o. female  was evaluated in triage.  Pt complains of right foot, right hand, and right shoulder pain after a mechanical fall yesterday. The patient was at the pool yesterday and tripped in her flip flops and hurt her hand and ankle. The patient is currently on a blood thinner, but she assures me that she did not hit her head of neck. Denies any numbness or tingling. She reports that she can not weight bear on the leg.   Review of Systems  Positive:  Negative:   Physical Exam  BP (!) 144/85   Pulse 90   Temp 97.6 F (36.4 C) (Oral)   Resp 18   Ht '5\' 2"'$  (1.575 m)   Wt 50.8 kg   LMP 12/14/2000   SpO2 100%   BMI 20.49 kg/m  Gen:   Awake, no distress   Resp:  Normal effort  MSK:   Moves extremities without difficulty  Other:  Bruising and swelling noted to the patients foot, ankle, hand, and wrist.   Medical Decision Making  Medically screening exam initiated at 9:48 AM.  Appropriate orders placed.  Tracy [name] was informed that the remainder of the evaluation will be completed by another provider, this initial triage assessment does not replace that evaluation, and the importance of remaining in the ED until their evaluation is complete.  Will order XR imaging.    Sherrell Puller, [place] 07/22/22 [phone]

## 2022-07-22 NOTE — ED Triage Notes (Signed)
Pt states she was at the pool yesterday and sprained her right ankle due to her flip flop, pt also injured her right hand. Swelling noted to right foot. Pt reports she is on eliquis, denies hitting her head. She is also currently undergoing chemo. Pt also has noted discoloration to left foot as well, denies injury to that foot. Reports her feet always look blue.

## 2022-07-22 NOTE — ED Provider Notes (Signed)
[facility] DEPT Provider Note   CSN: [id number] Arrival date & time: 07/22/22  0932     History  Chief Complaint  Patient presents with   Ankle Pain    Tracy [name] is a 71 y.o. female.  Patient is a 71 year old female presenting for fall that occurred at pool yesterday.  Patient was a mechanical fall in which she tripped over her flip-flop and injured her right foot.  Mitts to pain, bruising, and swelling.  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  Patient also admits to right ankle pain, right hand pain.  Denies any head trauma or LOC.  Patient states has been approximately greater than 12 hours since her fall.  Denies any sensation changes, motor deficits, headaches or confusion.   The history is provided by the patient. No language interpreter was used.  Ankle Pain Associated symptoms: no back pain and no fever        Home Medications Prior to Admission medications   Medication Sig Start Date End Date Taking? Authorizing Provider  acetaminophen (TYLENOL) 325 MG tablet Take 2 tablets (650 mg total) by mouth every 6 (six) hours as needed for mild pain. 06/21/01  Yes Campbell Stall P, DO  oxyCODONE (ROXICODONE) 5 MG immediate release tablet Take 1 tablet (5 mg total) by mouth every 6 (six) hours as needed for up to 3 days for severe pain. 07/22/22 03/22/72 Yes Campbell Stall P, DO  albuterol (PROVENTIL) (2.5 MG/3ML) 0.083% nebulizer solution Take 3 mLs (2.5 mg total) by nebulization every 4 (four) hours as needed for wheezing or shortness of breath. DX: J44.9 06/08/22   Icard, Octavio Graves, DO  albuterol (VENTOLIN HFA) 108 (90 Base) MCG/ACT inhaler Inhale 2 puffs into the lungs every 6 (six) hours as needed for wheezing or shortness of breath. 06/08/22   Icard, Octavio Graves, DO  ALPRAZolam (XANAX) 0.25 MG tablet Take 1 tablet (0.25 mg total) by mouth at bedtime as needed for anxiety. 02/13/21   [name], MD  capecitabine (XELODA) 500 MG  tablet Take 3 tablets every 12 hours. Take for 7 days on, 7 days off, repeat every 14 days. 07/13/22   [name], MD  docusate sodium (COLACE) 50 MG capsule Take 50 mg by mouth daily as needed for mild constipation.    [provider]  doxycycline (VIBRA-TABS) 100 MG tablet Take 1 tablet (100 mg total) by mouth 2 (two) times daily. 04/13/22   [name], MD  fluticasone (FLONASE) 50 MCG/ACT nasal spray Place 2 sprays into both nostrils daily. 06/08/22   Icard, Octavio Graves, DO  Fluticasone-Umeclidin-Vilant (TRELEGY ELLIPTA) 100-62.5-25 MCG/ACT AEPB Inhale 1 puff into the lungs daily. 06/08/22 09/06/22  Icard, Octavio Graves, DO  Fluticasone-Umeclidin-Vilant (TRELEGY ELLIPTA) 100-62.5-25 MCG/INH AEPB Inhale 1 puff into the lungs daily. 09/26/21   Icard, Octavio Graves, DO  HYDROcodone bit-homatropine (HYCODAN) 5-1.5 MG/5ML syrup Take 5 mLs by mouth every 6 (six) hours as needed for cough. 06/08/22   June Leap L, DO  lidocaine-prilocaine (EMLA) cream Apply 1 application. topically as needed. 03/23/22   Heilingoetter, Cassandra L, PA-C  loratadine (CLARITIN) 10 MG tablet Take 1 tablet (10 mg total) by mouth daily. 06/08/22   Icard, Octavio Graves, DO  losartan (COZAAR) 25 MG tablet TAKE 1 TABLET (25 MG TOTAL) BY MOUTH DAILY. 07/13/22   Alla Feeling, NP  ondansetron (ZOFRAN) 8 MG tablet Take 1 tablet (8 mg total) by mouth every 8 (eight) hours as needed for  nausea or vomiting. 02/24/21   Alla Feeling, NP  predniSONE (DELTASONE) 10 MG tablet Take 4 tabs by mouth once daily x4 days, then 3 tabs x4 days, 2 tabs x4 days, 1 tab x4 days and stop. 06/08/22   Icard, Octavio Graves, DO  prochlorperazine (COMPAZINE) 10 MG tablet Take 1 tablet (10 mg total) by mouth every 6 (six) hours as needed for nausea or vomiting. 08/19/21   Alla Feeling, NP  XARELTO 20 MG TABS tablet TAKE 1 TABLET BY MOUTH DAILY WITH SUPPER 06/17/22   [name], MD      Allergies    Amlodipine and Codeine    Review of Systems   Review of Systems   Constitutional:  Negative for chills and fever.  Eyes:  Negative for pain and visual disturbance.  Respiratory:  Negative for cough and shortness of breath.   Cardiovascular:  Negative for chest pain and palpitations.  Gastrointestinal:  Negative for abdominal pain and vomiting.  Musculoskeletal:  Negative for arthralgias and back pain.  Skin:  Negative for color change and rash.  Neurological:  Negative for seizures and syncope.  All other systems reviewed and are negative.   Physical Exam Updated Vital Signs BP (!) 144/85   Pulse 90   Temp 97.6 F (36.4 C) (Oral)   Resp 18   Ht '5\' 2"'$  (1.575 m)   Wt 50.8 kg   LMP 12/14/2000   SpO2 100%   BMI 20.49 kg/m  Physical Exam Vitals and nursing note reviewed.  Constitutional:      General: She is not in acute distress.    Appearance: She is well-developed.  HENT:     Head: Normocephalic and atraumatic.  Eyes:     Conjunctiva/sclera: Conjunctivae normal.  Cardiovascular:     Rate and Rhythm: Normal rate and regular rhythm.     Pulses:          Dorsalis pedis pulses are 2+ on the right side.     Heart sounds: No murmur heard. Pulmonary:     Effort: Pulmonary effort is normal. No respiratory distress.     Breath sounds: Normal breath sounds.  Abdominal:     Palpations: Abdomen is soft.     Tenderness: There is no abdominal tenderness.  Musculoskeletal:        General: No swelling.     Right shoulder: Normal.     Left shoulder: Normal.     Right upper arm: Normal.     Left upper arm: Normal.     Right elbow: Normal.     Left elbow: Normal.     Right forearm: Normal.     Left forearm: Normal.     Right wrist: Normal.     Left wrist: Normal.     Right hand: Bony tenderness present.     Left hand: Normal.     Cervical back: Neck supple. No bony tenderness.     Thoracic back: No bony tenderness.     Lumbar back: No bony tenderness.     Right hip: Normal.     Left hip: Normal.     Right upper leg: Normal.     Left  upper leg: Normal.     Right knee: Normal.     Left knee: Normal.     Right lower leg: Normal.     Left lower leg: Normal.     Right ankle: Tenderness present over the lateral malleolus.     Left ankle: Normal.  Right foot: Swelling and bony tenderness present.     Left foot: Normal.  Skin:    General: Skin is warm and dry.     Capillary Refill: Capillary refill takes less than 2 seconds.  Neurological:     Mental Status: She is alert and oriented to person, place, and time.     GCS: GCS eye subscore is 4. GCS verbal subscore is 5. GCS motor subscore is 6.     Sensory: Sensation is intact.     Motor: Motor function is intact.  Psychiatric:        Mood and Affect: Mood normal.     ED Results / Procedures / Treatments   Labs (all labs ordered are listed, but only abnormal results are displayed) Labs Reviewed - No data to display  EKG None  Radiology DG Wrist Complete Right  Result Date: 07/22/2022 CLINICAL DATA:  Right wrist pain following a fall last night. EXAM: RIGHT WRIST - COMPLETE 3+ VIEW COMPARISON:  Right hand radiographs obtained at the same time. FINDINGS: Previously described screw and plate fixation of the distal radius. No fracture or dislocation seen. IMPRESSION: No fracture or dislocation. Electronically Signed   By: Claudie Revering M.D.   On: 07/22/2022 10:38   DG Hand Complete Right  Result Date: 07/22/2022 CLINICAL DATA:  Right hand pain following a fall last night. EXAM: RIGHT HAND - COMPLETE 3+ VIEW COMPARISON:  None Available. FINDINGS: Screw and plate fixation of the distal radius. Mild spur formation at the articulation of the 1st metacarpal and trapezium and the articulation of the trapezium and scaphoid. No acute fracture or dislocation seen. IMPRESSION: 1. No fracture. 2. Mild 1st metacarpal/carpal and trapezium/scaphoid degenerative changes. Electronically Signed   By: Claudie Revering M.D.   On: 07/22/2022 10:38   DG Foot Complete Right  Result Date:  07/22/2022 CLINICAL DATA:  Right foot pain following a fall last night. EXAM: RIGHT FOOT COMPLETE - 3+ VIEW COMPARISON:  Right ankle radiographs obtained at the same time. FINDINGS: The fracture of the base of the 5th metatarsal is better visualized on the ankle radiographs. There is also a fracture of the distal aspect of the 5th metatarsal with minimal medial displacement of the distal fragment. No intra-articular extension seen. Diffuse dorsal soft tissue swelling. IMPRESSION: Fractures of the proximal and distal portions of the 5th metatarsal. Electronically Signed   By: Claudie Revering M.D.   On: 07/22/2022 10:36   DG Ankle Complete Right  Result Date: 07/22/2022 CLINICAL DATA:  Right ankle pain following a fall last night. EXAM: RIGHT ANKLE - COMPLETE 3+ VIEW COMPARISON:  None Available. FINDINGS: Diffuse lateral soft tissue swelling. Large effusion. Fracture of the base of the 5th metatarsal with mild proximal displacement of the proximal fragment. IMPRESSION: 1. Fracture of the base of the 5th metatarsal. 2. Soft tissue swelling and large effusion. Electronically Signed   By: Claudie Revering M.D.   On: 07/22/2022 10:33   DG Shoulder Right  Result Date: 07/22/2022 CLINICAL DATA:  Right shoulder pain following a fall yesterday. EXAM: RIGHT SHOULDER - 2+ VIEW COMPARISON:  None Available. FINDINGS: Moderate glenohumeral spur formation. Diffuse osteopenia. No fracture or dislocation seen. Right jugular porta catheter tip in the superior vena cava. IMPRESSION: 1. No fracture or dislocation. 2. Right shoulder degenerative changes. Electronically Signed   By: Claudie Revering M.D.   On: 07/22/2022 10:31    Procedures Procedures    Medications Ordered in ED Medications  ibuprofen (ADVIL) tablet 600  mg (600 mg Oral Given 07/22/22 1127)  oxyCODONE (Oxy IR/ROXICODONE) immediate release tablet 5 mg (5 mg Oral Given 07/22/22 1127)    ED Course/ Medical Decision Making/ A&P                           Medical Decision  Making Risk OTC drugs. Prescription drug management.   31:85 AM 71 year old female presenting for fall that occurred at pool yesterday.  Patient is alert oriented x 3, no acute distress, afebrile, stable vital signs.  Physical exam demonstrates ecchymosis, swelling, tenderness to palpation of right lateral malleolus and fifth metatarsal.  X-ray demonstrates fifth metatarsal fracture nondisplaced.  Also right-sided hand pain.  Minimal ecchymosis present on the palmar aspect.  X-ray demonstrates no fractures.   At this time patient is 12 hours post fall.  No history of head trauma.  On Xarelto.  Exam demonstrates no neurovascular deficits.  I think it is very to withhold CT imaging of the brain at this time.  Patient agreeable to plan  Given pain meds, walking boot, crutches, and recommended for close follow-up with orthopedic surgeon for fifth metatarsal fracture.  Rest, elevation, ice, and Motrin for pain.  She also has wheelchair at home if needed.  Patient in no distress and overall condition improved here in the ED. Detailed discussions were had with the patient regarding current findings, and need for close f/u with PCP or on call doctor. The patient has been instructed to return immediately if the symptoms worsen in any way for re-evaluation. Patient verbalized understanding and is in agreement with current care plan. All questions answered prior to discharge.        Final Clinical Impression(s) / ED Diagnoses Final diagnoses:  Closed nondisplaced fracture of fifth metatarsal bone of right foot, initial encounter    Rx / DC Orders ED Discharge Orders          Ordered    oxyCODONE (ROXICODONE) 5 MG immediate release tablet  Every 6 hours PRN        07/22/22 1149    acetaminophen (TYLENOL) 325 MG tablet  Every 6 hours PRN        07/22/22 1149              Lianne Cure, DO 38/25/05 1150

## 2022-07-23 ENCOUNTER — Ambulatory Visit (HOSPITAL_COMMUNITY): Payer: Medicare HMO

## 2022-07-27 ENCOUNTER — Inpatient Hospital Stay: Payer: Medicare HMO | Admitting: Hematology

## 2022-07-29 ENCOUNTER — Telehealth: Payer: Self-pay

## 2022-07-29 ENCOUNTER — Other Ambulatory Visit: Payer: Self-pay

## 2022-07-29 NOTE — Telephone Encounter (Signed)
Pt called stating she needs to reschedule her appts on Friday, 07/31/2022.  Pt stated she was not aware of the appt and is not able to make the appt.  Informed pt that our Scheduling Team contacted her on 07/22/2022 to reschedule the appts to 07/31/2022 d/t Dr. Burr Medico will be out of the office on 08/03/2022.  Also informed pt that she is scheduled on 08/03/2022 for chemotherapy infusion and will need to be seen by a provider prior to her infusion appt.  Notified Dr. Burr Medico of the pt's request to cancel appts.

## 2022-07-30 ENCOUNTER — Telehealth: Payer: Self-pay

## 2022-07-30 NOTE — Telephone Encounter (Signed)
Spoke with pt via telephone regarding appts on 07/31/2022 & 08/03/2022 being rescheduled to the week of 08/24/2022 since the pt fractured her foot and did not complete her CT Scan.  Instructed pt to not take the Xeloda until she resumes her IV chemotherapy per Dr. Burr Medico.  Pt will try to reschedule her CT Scan during this 2 to 3 wk break. Informed pt that someone from our Scheduling Team will be contacting her to reschedule her appts.

## 2022-07-31 ENCOUNTER — Inpatient Hospital Stay: Payer: Medicare HMO

## 2022-07-31 ENCOUNTER — Other Ambulatory Visit: Payer: Self-pay

## 2022-07-31 ENCOUNTER — Inpatient Hospital Stay: Payer: Medicare HMO | Admitting: Hematology

## 2022-08-03 ENCOUNTER — Inpatient Hospital Stay: Payer: Medicare HMO | Admitting: Hematology

## 2022-08-03 ENCOUNTER — Inpatient Hospital Stay: Payer: Medicare HMO

## 2022-08-05 ENCOUNTER — Other Ambulatory Visit: Payer: Self-pay

## 2022-08-07 ENCOUNTER — Encounter: Payer: Self-pay | Admitting: *Deleted

## 2022-08-07 ENCOUNTER — Other Ambulatory Visit: Payer: Self-pay | Admitting: *Deleted

## 2022-08-07 NOTE — Patient Instructions (Signed)
Visit Information  Thank you for taking time to visit with me today. Please don't hesitate to contact me if I can be of assistance to you.   Following are the goals we discussed today:   Goals Addressed               This Visit's Progress     No needs (pt-stated)        Care Coordination Interventions: Provided education to patient and/or caregiver about advanced directives Reviewed medications with patient and discussed purpose of all medications Reviewed scheduled/upcoming provider appointments including pending appointments and requested pt to contact her primary provider to schedule AWV for 2023 Screening for signs and symptoms of depression related to chronic disease state  Assessed social determinant of health barriers           Please call the care guide team at [phone] if you need to cancel or reschedule your appointment.   If you are experiencing a Mental Health or [place] or need someone to talk to, please call the Suicide and Crisis Lifeline: 988  Patient verbalizes understanding of instructions and care plan provided today and agrees to view in [place]. Active MyChart status and patient understanding of how to access instructions and care plan via MyChart confirmed with patient.     No further follow up required: no needs  Raina Mina, RN Care Management Coordinator [place] Office [phone]

## 2022-08-07 NOTE — Patient Outreach (Signed)
  Care Coordination   Initial Visit Note   08/07/2022 Name: Tracy [name] MRN: [id number] DOB: Dec 24, 1950  Tracy [name] is a 71 y.o. year old female who sees Kuneff, Renee A, DO for primary care. I spoke with  Gurney Maxin by phone today.  What matters to the patients health and wellness today?  No needs    Goals Addressed               This Visit's Progress     No needs (pt-stated)        Care Coordination Interventions: Provided education to patient and/or caregiver about advanced directives Reviewed medications with patient and discussed purpose of all medications Reviewed scheduled/upcoming provider appointments including pending appointments and requested pt to contact her primary provider to schedule AWV for 2023 Screening for signs and symptoms of depression related to chronic disease state  Assessed social determinant of health barriers          SDOH assessments and interventions completed:  Yes  SDOH Interventions Today    Flowsheet Row Most Recent Value  SDOH Interventions   Food Insecurity Interventions Intervention Not Indicated  Housing Interventions Intervention Not Indicated  Transportation Interventions Intervention Not Indicated        Care Coordination Interventions Activated:  Yes  Care Coordination Interventions:  Yes, provided   Follow up plan: No further intervention required.   Encounter Outcome:  Pt. Visit Completed   Raina Mina, RN Care Management Coordinator [place] Office [phone]

## 2022-08-10 ENCOUNTER — Other Ambulatory Visit: Payer: Self-pay | Admitting: Nurse Practitioner

## 2022-08-10 DIAGNOSIS — S92354A Nondisplaced fracture of fifth metatarsal bone, right foot, initial encounter for closed fracture: Secondary | ICD-10-CM | POA: Diagnosis not present

## 2022-08-10 DIAGNOSIS — M25571 Pain in right ankle and joints of right foot: Secondary | ICD-10-CM | POA: Diagnosis not present

## 2022-08-10 DIAGNOSIS — S92353A Displaced fracture of fifth metatarsal bone, unspecified foot, initial encounter for closed fracture: Secondary | ICD-10-CM

## 2022-08-10 HISTORY — DX: Displaced fracture of fifth metatarsal bone, unspecified foot, initial encounter for closed fracture: S92.353A

## 2022-08-11 ENCOUNTER — Encounter: Payer: Self-pay | Admitting: Hematology

## 2022-08-15 ENCOUNTER — Other Ambulatory Visit: Payer: Self-pay

## 2022-08-16 ENCOUNTER — Other Ambulatory Visit: Payer: Self-pay

## 2022-08-20 ENCOUNTER — Ambulatory Visit (HOSPITAL_COMMUNITY)
Admission: RE | Admit: 2022-08-20 | Discharge: 2022-08-20 | Disposition: A | Discharge status: Home / Self Care | Payer: Medicare HMO | Source: Ambulatory Visit | Attending: Hematology | Admitting: Hematology

## 2022-08-20 DIAGNOSIS — C799 Secondary malignant neoplasm of unspecified site: Secondary | ICD-10-CM | POA: Insufficient documentation

## 2022-08-20 DIAGNOSIS — C20 Malignant neoplasm of rectum: Secondary | ICD-10-CM | POA: Diagnosis not present

## 2022-08-20 DIAGNOSIS — J432 Centrilobular emphysema: Secondary | ICD-10-CM | POA: Diagnosis not present

## 2022-08-20 DIAGNOSIS — C189 Malignant neoplasm of colon, unspecified: Secondary | ICD-10-CM | POA: Diagnosis not present

## 2022-08-20 DIAGNOSIS — K7689 Other specified diseases of liver: Secondary | ICD-10-CM | POA: Diagnosis not present

## 2022-08-20 DIAGNOSIS — Z933 Colostomy status: Secondary | ICD-10-CM | POA: Diagnosis not present

## 2022-08-20 DIAGNOSIS — Z9049 Acquired absence of other specified parts of digestive tract: Secondary | ICD-10-CM | POA: Diagnosis not present

## 2022-08-20 MED ORDER — IOHEXOL 300 MG/ML  SOLN
100.0000 mL | Freq: Once | INTRAMUSCULAR | Status: AC | PRN
  Administered 2022-08-20: 100 mL via INTRAVENOUS

## 2022-08-20 MED ORDER — SODIUM CHLORIDE (PF) 0.9 % IJ SOLN
INTRAMUSCULAR | Status: AC
  Filled 2022-08-20: qty 50

## 2022-08-23 ENCOUNTER — Other Ambulatory Visit: Payer: Self-pay | Admitting: Hematology

## 2022-08-23 DIAGNOSIS — C20 Malignant neoplasm of rectum: Secondary | ICD-10-CM

## 2022-08-25 DIAGNOSIS — Z933 Colostomy status: Secondary | ICD-10-CM | POA: Diagnosis not present

## 2022-08-26 MED FILL — Dexamethasone Sodium Phosphate Inj 100 MG/10ML: INTRAMUSCULAR | Qty: 1 | Status: AC

## 2022-08-27 ENCOUNTER — Inpatient Hospital Stay: Payer: Medicare HMO | Admitting: Dietician

## 2022-08-27 ENCOUNTER — Inpatient Hospital Stay: Payer: Medicare HMO | Attending: Hematology

## 2022-08-27 ENCOUNTER — Inpatient Hospital Stay: Payer: Medicare HMO

## 2022-08-27 ENCOUNTER — Other Ambulatory Visit: Payer: Self-pay | Admitting: Hematology

## 2022-08-27 ENCOUNTER — Inpatient Hospital Stay (HOSPITAL_BASED_OUTPATIENT_CLINIC_OR_DEPARTMENT_OTHER): Payer: Medicare HMO | Admitting: Hematology

## 2022-08-27 ENCOUNTER — Other Ambulatory Visit: Payer: Self-pay

## 2022-08-27 ENCOUNTER — Encounter: Payer: Self-pay | Admitting: Hematology

## 2022-08-27 VITALS — BP 146/71 | HR 71 | Temp 98.5°F | Resp 18 | Ht 62.0 in | Wt 114.5 lb

## 2022-08-27 VITALS — BP 117/58 | HR 84 | Temp 97.5°F | Resp 18

## 2022-08-27 DIAGNOSIS — Z933 Colostomy status: Secondary | ICD-10-CM | POA: Insufficient documentation

## 2022-08-27 DIAGNOSIS — C799 Secondary malignant neoplasm of unspecified site: Secondary | ICD-10-CM

## 2022-08-27 DIAGNOSIS — Z5112 Encounter for antineoplastic immunotherapy: Secondary | ICD-10-CM | POA: Diagnosis not present

## 2022-08-27 DIAGNOSIS — C20 Malignant neoplasm of rectum: Secondary | ICD-10-CM

## 2022-08-27 DIAGNOSIS — C181 Malignant neoplasm of appendix: Secondary | ICD-10-CM

## 2022-08-27 DIAGNOSIS — Z79899 Other long term (current) drug therapy: Secondary | ICD-10-CM | POA: Insufficient documentation

## 2022-08-27 DIAGNOSIS — Z86711 Personal history of pulmonary embolism: Secondary | ICD-10-CM | POA: Insufficient documentation

## 2022-08-27 DIAGNOSIS — Z7901 Long term (current) use of anticoagulants: Secondary | ICD-10-CM | POA: Diagnosis not present

## 2022-08-27 DIAGNOSIS — I10 Essential (primary) hypertension: Secondary | ICD-10-CM | POA: Insufficient documentation

## 2022-08-27 DIAGNOSIS — F419 Anxiety disorder, unspecified: Secondary | ICD-10-CM | POA: Diagnosis not present

## 2022-08-27 DIAGNOSIS — C786 Secondary malignant neoplasm of retroperitoneum and peritoneum: Secondary | ICD-10-CM | POA: Diagnosis not present

## 2022-08-27 DIAGNOSIS — Z7952 Long term (current) use of systemic steroids: Secondary | ICD-10-CM | POA: Diagnosis not present

## 2022-08-27 DIAGNOSIS — R69 Illness, unspecified: Secondary | ICD-10-CM | POA: Diagnosis not present

## 2022-08-27 DIAGNOSIS — Z5111 Encounter for antineoplastic chemotherapy: Secondary | ICD-10-CM | POA: Diagnosis not present

## 2022-08-27 LAB — CMP (CANCER CENTER ONLY)
ALT: 9 U/L (ref 0–44)
AST: 19 U/L (ref 15–41)
Albumin: 3.8 g/dL (ref 3.5–5.0)
Alkaline Phosphatase: 104 U/L (ref 38–126)
Anion gap: 8 (ref 5–15)
BUN: 16 mg/dL (ref 8–23)
CO2: 26 mmol/L (ref 22–32)
Calcium: 9.3 mg/dL (ref 8.9–10.3)
Chloride: 103 mmol/L (ref 98–111)
Creatinine: 0.75 mg/dL (ref 0.44–1.00)
GFR, Estimated: >60 mL/min (ref >60)
Glucose, Bld: 117 mg/dL — ABNORMAL HIGH (ref 70–99)
Potassium: 3.4 mmol/L — ABNORMAL LOW (ref 3.5–5.1)
Sodium: 137 mmol/L (ref 135–145)
Total Bilirubin: 0.8 mg/dL (ref 0.3–1.2)
Total Protein: 6.7 g/dL (ref 6.5–8.1)

## 2022-08-27 LAB — CBC WITH DIFFERENTIAL (CANCER CENTER ONLY)
Abs Immature Granulocytes: 0.03 10*3/uL (ref 0.00–0.07)
Basophils Absolute: 0 10*3/uL (ref 0.0–0.1)
Basophils Relative: 0 %
Eosinophils Absolute: 0.1 10*3/uL (ref 0.0–0.5)
Eosinophils Relative: 1 %
HCT: 34.8 % — ABNORMAL LOW (ref 36.0–46.0)
Hemoglobin: 11.8 g/dL — ABNORMAL LOW (ref 12.0–15.0)
Immature Granulocytes: 0 %
Lymphocytes Relative: 17 %
Lymphs Abs: 1.3 10*3/uL (ref 0.7–4.0)
MCH: 36.4 pg — ABNORMAL HIGH (ref 26.0–34.0)
MCHC: 33.9 g/dL (ref 30.0–36.0)
MCV: 107.4 fL — ABNORMAL HIGH (ref 80.0–100.0)
Monocytes Absolute: 0.8 10*3/uL (ref 0.1–1.0)
Monocytes Relative: 10 %
Neutro Abs: 5.7 10*3/uL (ref 1.7–7.7)
Neutrophils Relative %: 72 %
Platelet Count: 224 10*3/uL (ref 150–400)
RBC: 3.24 MIL/uL — ABNORMAL LOW (ref 3.87–5.11)
RDW: 15.5 % (ref 11.5–15.5)
WBC Count: 8 10*3/uL (ref 4.0–10.5)
nRBC: 0 % (ref 0.0–0.2)

## 2022-08-27 LAB — CEA (IN HOUSE-CHCC): CEA (CHCC-In House): 9.59 ng/mL — ABNORMAL HIGH (ref 0.00–5.00)

## 2022-08-27 MED ORDER — SODIUM CHLORIDE 0.9 % IV SOLN
7.5000 mg/kg | Freq: Once | INTRAVENOUS | Status: AC
  Administered 2022-08-27: 400 mg via INTRAVENOUS
  Filled 2022-08-27: qty 16

## 2022-08-27 MED ORDER — DEXTROSE 5 % IV SOLN: Freq: Once | INTRAVENOUS | Status: AC

## 2022-08-27 MED ORDER — CAPECITABINE 500 MG PO TABS: ORAL_TABLET | ORAL | 2 refills | Status: DC

## 2022-08-27 MED ORDER — SODIUM CHLORIDE 0.9% FLUSH
10.0000 mL | INTRAVENOUS | Status: DC | PRN
  Administered 2022-08-27: 10 mL

## 2022-08-27 MED ORDER — PALONOSETRON HCL INJECTION 0.25 MG/5ML
0.2500 mg | Freq: Once | INTRAVENOUS | Status: AC
  Administered 2022-08-27: 0.25 mg via INTRAVENOUS
  Filled 2022-08-27: qty 5

## 2022-08-27 MED ORDER — SODIUM CHLORIDE 0.9% FLUSH
10.0000 mL | Freq: Once | INTRAVENOUS | Status: AC
  Administered 2022-08-27: 10 mL

## 2022-08-27 MED ORDER — OXALIPLATIN CHEMO INJECTION 100 MG/20ML
50.0000 mg/m2 | Freq: Once | INTRAVENOUS | Status: AC
  Administered 2022-08-27: 75 mg via INTRAVENOUS
  Filled 2022-08-27: qty 15

## 2022-08-27 MED ORDER — HEPARIN SOD (PORK) LOCK FLUSH 100 UNIT/ML IV SOLN
500.0000 [IU] | Freq: Once | INTRAVENOUS | Status: AC | PRN
  Administered 2022-08-27: 500 [IU]

## 2022-08-27 MED ORDER — SODIUM CHLORIDE 0.9 % IV SOLN
10.0000 mg | Freq: Once | INTRAVENOUS | Status: AC
  Administered 2022-08-27: 10 mg via INTRAVENOUS
  Filled 2022-08-27: qty 10

## 2022-08-27 MED ORDER — RIVAROXABAN 20 MG PO TABS: 20.0000 mg | ORAL_TABLET | Freq: Every day | ORAL | 1 refills | Status: DC

## 2022-08-27 MED ORDER — SODIUM CHLORIDE 0.9 % IV SOLN: Freq: Once | INTRAVENOUS | Status: AC

## 2022-08-27 NOTE — Progress Notes (Signed)
[place]   [phone] [phone]   Clinic Follow up Note   Patient Care Team: [name], DO as PCP - General (Family Medicine) [name], MD as Consulting Physician (Hematology) Icard, Octavio Graves, DO as Consulting Physician (Pulmonary Disease) [place], P.A. [name], MD as Consulting Physician (General Surgery) [name], MD as Consulting Physician (General Surgery) [name], [name], MD as Consulting Physician (Gastroenterology)  Date of Service:  08/27/2022  CHIEF COMPLAINT: f/u of colon cancer  CURRENT THERAPY:  CAPOX -Xeloda started 02/01/21, currently 1500 mg BID 7 days on/7 days off -Bevacizumab q2-3 weeks added with C2 02/24/21             -Beva held 09/02/21-12/28/21 d/t PE -Oxaliplatin added with C4 03/24/21, currently q3weeks  ASSESSMENT & PLAN:  Tracy [name] is a 71 y.o. female with   1. [name], MMR normal, peritoneal metastasis in 01/2021 -diagnosed 02/2020, s/p emergent surgery for perforation. Path showed invasive adenocarcinoma with perforation, +3/15 LNs, and clear margins. Initial CT scan negative for distant metastasis. She was not a candidate for adjuvant chemo due to very slow recovery from surgery -Guardant Reveal ctDNA were negative (06/06/20, 07/12/20, and 09/10/20). -surveillance colonoscopy by Dr. Havery Moros 01/06/21 showed new adenocarcinoma at appendiceal orifice. Biopsy confirmed adenocarcinoma -01/27/21 PET showed tumor in appendiceal orifice is likely peritoneal metastasis growing into the cecum.  Her peritoneal implants are mainly in the pelvis, difficult to biopsy -She began Xeloda on 02/01/21. Cycle 2 was changed to 1 week on/1 week off, and beva was added. She tolerates this well -PS improved and low dose oxaliplatin was added with C4, now on Capeox/beva q3 weeks, with continuing Xeloda 1 week on/1 week off. -Beva was held for PE found on CT  08/2021, she is on xarelto. Resumed beva q3 weeks 12/29/21 -she twisted her ankle on 07/22/22, recovering well. She has continued Xeloda as prescribed through recovery. Oxaliplatin and beva has been held  -restaging CT CAP on 08/20/22 was stable, with no new or progressive findings. I reviewed the good news with her. -labs reviewed, overall stable, adequate to proceed with beva and oxali today    2. H/o Bilateral PE -incidental finding on CT 08/14/21 -no hypoxia, stable respiratory status. She began xarelto 08/15/21 -Beva was held, CT 12/25/21 was clear, Beva resumed 12/29/21   3. Comorbidities: Asthma, COPD, anxiety, HTN -on trelegy, Claritin, flonase, and albuterol for her COPD/asthma per Dr. Valeta Harms. Uses albuterol nebulizer for exacerbation. -h/o Covid and URI 05/2021 -f/u pulmonology and PCP.      PLAN: -restart Beva and oxali today  -continue Xeloda, I refilled today -I also refilled xarelto -lab, flush, f/u, and oxali/beva in 3 and 6 weeks  -she prefers Monday appointments   No problem-specific Assessment & Plan notes found for this encounter.   SUMMARY OF ONCOLOGIC HISTORY: Oncology History Overview Note  Cancer Staging Rectal cancer [facility]) Staging form: Colon and Rectum, AJCC 8th Edition - Pathologic stage from 03/03/2020: Stage IIIB (pT4a, pN1b, cM0) - Signed by [name], MD on 01/28/2021 Stage prefix: Initial diagnosis Histologic grading system: 4 grade system Histologic grade (G): G2 Residual tumor (R): R0 - None    Rectal cancer ([place])  03/03/2020 - 04/05/2020 Hospital Admission   She was admitted to ED on 03/03/20 for abdominal pain and nausea. She had been having diarrhea for several weeks. During hospital stay she developed acute respiratory failure, AKI, RUE DVT from PICC line. Work  up showed bowel perforation, small left liver mass and thickening of jejunum. She underwent emergent surgery on 03/03/20 for resection and colostomy placement. Her path showed invasive cancer,  metastatic to 3/15 LNs. She had a NGT in placed but this was removed on POD 3. Post op her stoma became necrotic and septic. She had another bowel surgery on 03/12/20, which was NED with necrotic tissue.    03/03/2020 Imaging   CT AP 03/03/20  IMPRESSION: 1. Free intraperitoneal air consistent perforation. Most likely site of perforation is in the distal splenic flexure/proximal descending colon where there is a collection of air and gas measuring 6 centimeters. No obvious soft tissue mass identified in this region. At this site, there is abrupt transition of dilated, stool-filled colon to completely decompressed proximal descending colon. 2. Thickened, inflamed loops of jejunum are identified within the pelvis and are likely reactive. 3. Small hiatal hernia. 4. Benign-appearing 1.6 centimeter mass the LEFT hepatic lobe. Recommend comparison with prior studies if available. 5.  Emphysema (ICD10-J43.9). 6.  Aortic Atherosclerosis (ICD10-I70.0). 7. Bilateral renal scarring.   03/03/2020 Surgery   low anterior resection end colostomy by Dr Marcello Moores    03/03/2020 Initial Biopsy   FINAL MICROSCOPIC DIAGNOSIS: 03/03/20 A. RECTOSIGMOID COLON, LOW ANTERIOR RESECTION:  - Invasive colonic adenocarcinoma, 3.5 cm.  - Tumor invades the visceral peritoneum.  - Margins of resection are not involved.  - Metastatic carcinoma in (3) of (15) lymph nodes.  - See oncology table.   B. ADDITIONAL SIGMOID COLON, RESECTION:  - Colonic tissue, negative for carcinoma.  ADDENDUM:  Mismatch Repair Protein (IHC)   SUMMARY INTERPRETATION: NORMAL  There is preserved expression of the major MMR proteins. There is a very  low probability that microsatellite instability (MSI) is present.  However, certain clinically significant MMR protein mutations may result  in preservation of nuclear expression. It is recommended that the  preservation of protein expression be correlated with molecular based  MSI testing.    IHC EXPRESSION RESULTS  TEST           RESULT  MLH1:          Preserved nuclear expression  MSH2:          Preserved nuclear expression  MSH6:          Preserved nuclear expression  PMS2:          Preserved nuclear expression   03/03/2020 Cancer Staging   Staging form: Colon and Rectum, AJCC 8th Edition - Pathologic stage from 03/03/2020: Stage IIIB (pT4a, pN1b, cM0) - Signed by [name], MD on 01/28/2021 Stage prefix: Initial diagnosis Histologic grading system: 4 grade system Histologic grade (G): G2 Residual tumor (R): R0 - None   03/08/2020 Imaging   CT AP 03/08/20 IMPRESSION: 1. Interval midline laparotomy with distal colon resection and diverting left lower quadrant colostomy. 2. Diffuse small bowel dilatation with gas fluid levels most consistent with postoperative ileus. 3. Trace ascites within the abdomen and pelvis. No fluid collection or abscess at this time. Surgical drain within the lower pelvis. 4. Indeterminate 1.4 cm subcapsular liver hypodensity. In light of newly diagnosed rectal cancer, metastatic disease cannot be excluded. PET CT may be useful for further evaluation. 5. Interval development of trace bilateral pleural effusions and diffuse body wall edema.   03/12/2020 Surgery   EXPLORATORY LAPAROTOMY WITH BOWEL RESECTION AND COLOSTOMY by Dr Hassell Done 03/12/20  FINAL MICROSCOPIC DIAGNOSIS: 03/12/20  A. COLON, SPLENIC FLEXURE, RESECTION:  - Segment of colon (37 cm) with  perforation and associated inflammation  - Multiple mucosal ulcers with necrotizing inflammation  - No evidence of malignancy    03/12/2020 Imaging   CT AP WO contrast 03/12/20  IMPRESSION: 1. Exam is limited by lack of intravenous contrast, motion, and artifact from the patient's arms adjacent to the torso. Since 03/08/2020, there has been development of relatively large pockets of intraperitoneal free air. While intraperitoneal gas would not be unexpected on postoperative day 9, this gas is  new since an intervening study of 03/08/2020 and given the relatively large volume certainly raises concern for bowel perforation. No source for the intraperitoneal free air is evident on this study. There is a surgical drain in the pelvis, but there is no free gas around the drain itself to suggest that it represents the source. 2. New circumferential wall thickening in the splenic flexure, descending colon and sigmoid colon leading into the end colostomy. Infection/inflammation would be a consideration. Ischemia cannot be excluded. 3. Relatively small volume intraperitoneal free fluid. High attenuation small fluid collections in the left upper abdomen may reflect hemorrhage, infection or residua from prior perforation. 4. Interval progression of diffuse body wall edema. 5. Residual contrast material in the renal parenchyma from prior imaging, compatible with renal dysfunction.    03/19/2020 Imaging   CT CAP 03/19/20  IMPRESSION: 1. 5.5 x 3.2 cm fluid collection is noted along the greater curvature of the proximal stomach. 2. Surgical drain is again noted in the pelvis with tip in left lower quadrant. 3. Interval development of crescent-shaped fluid collection measuring 16.4 x 3.3 cm in the epigastric region and left upper quadrant of the abdomen which may extend into the left lower quadrant. Potentially this may represent abscess or developing abscess. Multiple other smaller fluid collections are noted which may represent small abscesses. 4. 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 5. Mild amount of free fluid is noted in the posterior pelvis. 6. Moderate anasarca is noted. Aortic Atherosclerosis (ICD10-I70.0).   04/12/2020 Imaging   CT AP 04/12/20  IMPRESSION: 1. Fluid collections within the LEFT abdomen have significantly decreased compared to previous CT exams, now nearly completely resolved. 2. Percutaneous drainage catheter with tip coiled posterior to the LEFT  kidney, stable positioning compared to the previous study. The more anterior catheter has been removed. 3. Small amount of free fluid persists within the abdomen and pelvis. 4. Trace bibasilar pleural effusions. 5. Anasarca. 6. While reviewing today's study, comparing with a chest/abdomen/pelvis CT from earlier same month, there is question of thrombus in the LEFT internal jugular vein. Recommend ultrasound of the LEFT IJ to exclude DVT. This recommendation discussed with patient's hospitalist, Dr. Owens Shark, on 04/12/2020 at 4:20 p.m.   Emphysema (ICD10-J43.9).   04/22/2020 Imaging   CT AP 04/22/20  IMPRESSION: 1. No recurrent intra-abdominal abscess status post interval removal of left retroperitoneal percutaneous drain. Stable small amount of free pelvic fluid. 2. Enlarging dependent bilateral pleural effusions, now moderate in volume. Associated atelectasis at both lung bases. 3. Progressive anasarca with generalized edema throughout the subcutaneous fat. 4. Stable small subcapsular fluid collection along the anterior aspect of the left hepatic lobe. 5. Aortic Atherosclerosis (ICD10-I70.0).   05/23/2020 Initial Diagnosis   Rectal cancer ([place])   01/14/2021 Imaging   CT CAP IMPRESSION: -5.0 cm soft tissue mass along the posterior aspect of the cecum at the base of the appendix, favored to reflect a peritoneal/serosal implant, corresponding to the patient's known adenocarcinoma. This is new from the prior. -Suspected additional pelvic  implants along the uterus and right adnexa, poorly visualized, new/progressive from the prior. -Additional pericapsular lesion along the lateral spleen is mildly progressive, suspicious for additional peritoneal implant. -No evidence of metastatic disease in the chest.   01/27/2021 PET scan   IMPRESSION:  1. Extensive hypermetabolic peritoneal metastasis, as detailed  above.  2. No evidence of hypermetabolic supradiaphragmatic disease.  3. Diffuse  low-level thyroid hypermetabolism can be seen in the  setting of thyroiditis. Consider correlation with thyroid function  labs.  4. Aortic atherosclerosis (ICD10-I70.0) and emphysema (ICD10-J43.9).    02/01/2021 -  Chemotherapy    First-line chemo Xeloda 1500 mg twice daily for day 1-14, every 21 days, started on 02/01/2021. Reduced to 1 week on/ 1 week off and added bevacizumab q2weeks from C2 on 02/24/21.  ----Given good tolerance, I changed to CAPOX and bevacizumab q2weeks from C4 on 03/24/21 with Xeloda 1515m in the AM and 10081min the PM 1 week on/1 week off.     03/24/2021 -  Chemotherapy   Patient is on Treatment Plan : COLORECTAL CapeOx + Bevacizumab q21d     05/08/2021 Imaging   PET  IMPRESSION: 1. There is been interval development of several FDG avid pulmonary nodules within both lungs. Additionally, there is new FDG avid low left paratracheal lymph node. Thoracic metastasis cannot be excluded. 2. Interval improvement in multifocal FDG avid peritoneal metastasis as detailed above.     08/14/2021 Imaging   CT CAP  IMPRESSION: Resolution of previously seen small bilateral pulmonary nodules since previous study.   Stable 8 mm mediastinal lymph node in the AP window.   No evidence of metastatic disease within the abdomen or pelvis.   Nonocclusive pulmonary embolism in bilateral central lower lobe pulmonary arteries, which appears subacute in age.   12/25/2021 Imaging   EXAM: CT CHEST, ABDOMEN, AND PELVIS WITH CONTRAST  IMPRESSION: 1. Minimal, bandlike residua of previously seen FDG avid pulmonary nodules, consistent with treated metastases. No new pulmonary nodules. 2. Unchanged post treatment appearance of the cecal base, with soft tissue thickening about the cecal base and adjacent pelvis, previously FDG avid and in keeping with known appendiceal orifice malignancy. 3. Unchanged central fluid attenuation lesions about the spleen, left iliopsoas, and left pelvic  sidewall, previously FDG avid, consistent with treated peritoneal implants. 4. No evidence of new metastatic disease in the chest, abdomen, or pelvis 5. Status post rectosigmoid colon resection with left lower quadrant end colostomy.   Aortic Atherosclerosis (ICD10-I70.0).   Malignant [place] of [place] 01/06/2021 Procedure   (surveillance colonoscopy for h/o rectal cancer) Impression:  - Preparation of the colon was fair, lavage performed with mostly adequate views. - Polypoid lesion obliterating appendiceal orifice. Biopsied to evaluate for malignancy. - The examined portion of the ileum was normal. - Two large polyps in the cecum, not removed today pending path at appendiceal orifice, bowel prep. If removed endoscopically in the future would favor EMR to be done at the hospital. - One 5 mm polyp at the ileocecal valve, removed with a cold snare. Resected and retrieved. - One 3 mm polyp in the transverse colon, removed with a cold snare. Resected and retrieved. - Parastomal hernia making initial entry to the distal colon challenging. - The examination was otherwise normal.   01/06/2021 Initial Biopsy   Diagnosis 1. Colon, biopsy, appendiceal oraface - ADENOCARCINOMA. 2. Colon, polyp(s), ileocecal valve, transverse, x2 - TUBULAR ADENOMA, NEGATIVE FOR HIGH GRADE DYSPLASIA (X1). - COLONIC MUCOSA WITH UNDERLYING LYMPHOID AGGREGATE, NEGATIVE  FOR DYSPLASIA (X1).  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 This is new from the  prior. -Suspected additional pelvic implants along the uterus and right adnexa, poorly visualized, new/progressive from the prior. -Additional pericapsular lesion along the lateral spleen is mildly progressive, suspicious for additional peritoneal implant. -No evidence of metastatic disease in the chest.   08/14/2021 Imaging   CT CAP IMPRESSION: Resolution of previously seen small bilateral pulmonary nodules since previous study. Stable 8 mm mediastinal lymph node in the AP window. No evidence of metastatic disease within the abdomen or pelvis. Nonocclusive pulmonary embolism in bilateral central lower lobe pulmonary arteries, which appears subacute in age.  Aortic Atherosclerosis (ICD10-I70.0) and Emphysema (ICD10-J43.9).     08/14/2021 Imaging   CT CAP  IMPRESSION: Resolution of previously seen small bilateral pulmonary nodules since previous study.   Stable 8 mm mediastinal lymph node in the AP window.   No evidence of metastatic disease within the abdomen or pelvis.   Nonocclusive pulmonary embolism in bilateral central lower lobe pulmonary arteries, which appears subacute in age.   12/25/2021 Imaging   EXAM: CT CHEST, ABDOMEN, AND PELVIS WITH CONTRAST  IMPRESSION: 1. Minimal, bandlike residua of previously seen FDG avid pulmonary nodules, consistent with treated metastases. No new pulmonary nodules. 2. Unchanged post treatment appearance of the cecal base, with soft tissue thickening about the cecal base and adjacent pelvis, previously FDG avid and in keeping with known appendiceal orifice malignancy. 3. Unchanged central fluid attenuation lesions about the spleen, left iliopsoas, and left pelvic sidewall, previously FDG avid, consistent with treated peritoneal implants. 4. No evidence of new metastatic disease in the chest, abdomen, or pelvis 5. Status post rectosigmoid colon resection with left lower quadrant end colostomy.   Aortic Atherosclerosis (ICD10-I70.0).       INTERVAL HISTORY:  Tracy [name] is here for a follow up of colon cancer. She was last seen by me on 07/13/22. She presents to the clinic alone. She is recovering from right ankle fracture. She explains she tripped while at the pool with her grandkids. She denies any pain and notes she will get to remove her boot next week. She has been off treatment now since dose on 07/13/22, and she reports she is feeling much better today. She explains she has continued Xeloda as prescribed. She reports some neuropathy to her fingers.   All other systems were reviewed with the patient and are negative.  MEDICAL HISTORY:  Past Medical History:  Diagnosis Date   Acute deep vein thrombosis (DVT) of brachial vein of right upper extremity (HCC)    Acute on chronic respiratory failure with hypoxia (HCC)    Asthma    Cataract    CHICKENPOX, HX OF 01/06/2011   Qualifier: Diagnosis of  By: Charlett Blake MD, [place]     Colon cancer [facility])    COPD (chronic obstructive pulmonary disease) ([place]) 2011   FeV1 31% predicted FeV1/FVX 47 %   COVID-19 05/2021   Essential hypertension 05/23/2020   Hemorrhoid    Open right radial  fracture 2020   Osteopenia    Perforated sigmoid colon ([place]) 03/03/2020   Pleural effusion    Pneumonia 2021   Postoperative intra-abdominal abscess 2021   Rectal cancer (HCC)    Seasonal allergies    takes Claritin daily prn   Shock circulatory ([place])    Tracheostomy status ([place])    Vitamin D deficiency    takes Vit d every 14 days    SURGICAL HISTORY: Past Surgical History:  Procedure Laterality Date   AUGMENTATION MAMMAPLASTY     saline   EXAMINATION UNDER ANESTHESIA  10/14/2012   Procedure: EXAM UNDER ANESTHESIA;  Surgeon: Gayland Curry, MD,FACS;  Location: [place];  Service: General;  Laterality: N/A;  rectal exam under anesthesia excisional hemorrhoidectomy hemorrhoidal banding x two   EXAMINATION UNDER ANESTHESIA  02/03/2013   excision hemorrhoidal tissue   FOOT SURGERY      left bunionectomy   HEMORRHOIDECTOMY WITH HEMORRHOID BANDING  10/14/2012   Procedure: HEMORRHOIDECTOMY WITH HEMORRHOID BANDING;  Surgeon: Gayland Curry, MD,FACS;  Location: [place];  Service: General;  Laterality: N/A;   IR IMAGING GUIDED PORT INSERTION  02/03/2021   IR THORACENTESIS ASP PLEURAL SPACE W/IMG GUIDE  04/23/2020   LAPAROTOMY N/A 03/03/2020   Procedure: low anterior resection end colostomy;  Surgeon: [name], MD;  Location: [facility];  Service: General;  Laterality: N/A;   LAPAROTOMY N/A 03/12/2020   Procedure: EXPLORATORY LAPAROTOMY WITH BOWEL RESECTION AND COLOSTOMY;  Surgeon: [name], MD;  Location: [facility];  Service: General;  Laterality: N/A;   OPEN REDUCTION INTERNAL FIXATION (ORIF) DISTAL RADIAL FRACTURE Right 11/01/2018   Procedure: OPEN REDUCTION INTERNAL FIXATION (ORIF) DISTAL RADIAL FRACTURE;  Surgeon: [name], MD;  Location: [place];  Service: Orthopedics;  Laterality: Right;   TONSILLECTOMY  1960   recurrent otitis media   US ECHOCARDIOGRAPHY  02/2020    poor windows, normal LV function, severely dilated RV with moderately reduced function, RV volume and pressure overload, mildly dilated RA    I have reviewed the social history and family history with the patient and they are unchanged from previous note.  ALLERGIES:  is allergic to amlodipine and codeine.  MEDICATIONS:  Current Outpatient Medications  Medication Sig Dispense Refill   acetaminophen (TYLENOL) 325 MG tablet Take 2 tablets (650 mg total) by mouth every 6 (six) hours as needed for mild pain. 30 tablet 0   albuterol (PROVENTIL) (2.5 MG/3ML) 0.083% nebulizer solution Take 3 mLs (2.5 mg total) by nebulization every 4 (four) hours as needed for wheezing or shortness of breath. DX: J44.9 360 mL 5   albuterol (VENTOLIN HFA) 108 (90 Base) MCG/ACT inhaler Inhale 2 puffs into the lungs every 6 (six) hours as needed for wheezing or shortness of breath. 8 g 5   ALPRAZolam (XANAX) 0.25  MG tablet Take 1 tablet (0.25 mg total) by mouth at bedtime as needed for anxiety. 30 tablet 0   capecitabine (XELODA) 500 MG tablet Take 3 tablets every 12 hours. Take for 7 days on, 7 days off, repeat every 14 days. 84 tablet 2   docusate sodium (COLACE) 50 MG capsule Take 50 mg by mouth daily as needed for mild constipation.     doxycycline (VIBRA-TABS) 100 MG tablet Take 1 tablet (100 mg total) by mouth 2 (two) times daily. 14 tablet 0   fluticasone (FLONASE) 50 MCG/ACT nasal spray Place 2 sprays into both nostrils daily. 16 mL 5   Fluticasone-Umeclidin-Vilant (TRELEGY ELLIPTA) 100-62.5-25 MCG/ACT AEPB Inhale  1 puff into the lungs daily. 90 each 3   Fluticasone-Umeclidin-Vilant (TRELEGY ELLIPTA) 100-62.5-25 MCG/INH AEPB Inhale 1 puff into the lungs daily. 60 each 5   HYDROcodone bit-homatropine (HYCODAN) 5-1.5 MG/5ML syrup Take 5 mLs by mouth every 6 (six) hours as needed for cough. 240 mL 0   lidocaine-prilocaine (EMLA) cream Apply 1 application. topically as needed. 30 g 2   loratadine (CLARITIN) 10 MG tablet Take 1 tablet (10 mg total) by mouth daily. 90 tablet 3   losartan (COZAAR) 25 MG tablet TAKE 1 TABLET (25 MG TOTAL) BY MOUTH DAILY. 30 tablet 1   ondansetron (ZOFRAN) 8 MG tablet Take 1 tablet (8 mg total) by mouth every 8 (eight) hours as needed for nausea or vomiting. 20 tablet 2   predniSONE (DELTASONE) 10 MG tablet Take 4 tabs by mouth once daily x4 days, then 3 tabs x4 days, 2 tabs x4 days, 1 tab x4 days and stop. 40 tablet 0   prochlorperazine (COMPAZINE) 10 MG tablet Take 1 tablet (10 mg total) by mouth every 6 (six) hours as needed for nausea or vomiting. 30 tablet 2   rivaroxaban (XARELTO) 20 MG TABS tablet Take 1 tablet (20 mg total) by mouth daily with supper. 90 tablet 1   No current facility-administered medications for this visit.   Facility-Administered Medications Ordered in Other Visits  Medication Dose Route Frequency Provider Last Rate Last Admin   sodium chloride  flush (NS) 0.9 % injection 10 mL  10 mL Intracatheter PRN [name], MD   10 mL at 08/27/22 1400    PHYSICAL EXAMINATION: ECOG PERFORMANCE STATUS: 2 - Symptomatic, <50% confined to bed  Vitals:   08/27/22 0953  BP: (!) 117/58  Pulse: 84  Resp: 18  Temp: (!) 97.5 F (36.4 C)  SpO2: 95%   Wt Readings from Last 3 Encounters:  08/27/22 114 lb 8 oz (51.9 kg)  07/22/22 112 lb (50.8 kg)  07/13/22 113 lb 11.2 oz (51.6 kg)     GENERAL:alert, no distress and comfortable SKIN: skin color normal, no rashes or significant lesions EYES: normal, Conjunctiva are pink and non-injected, sclera clear  NEURO: alert & oriented x 3 with fluent speech  LABORATORY DATA:  I have reviewed the data as listed    Latest Ref Rng & Units 08/27/2022    9:39 AM 07/13/2022   10:07 AM 06/18/2022    9:31 AM  CBC  WBC 4.0 - 10.5 K/uL 8.0  7.3  6.0   Hemoglobin 12.0 - 15.0 g/dL 11.8  12.7  12.2   Hematocrit 36.0 - 46.0 % 34.8  36.5  35.4   Platelets 150 - 400 K/uL 224  261  259         Latest Ref Rng & Units 08/27/2022    9:39 AM 07/13/2022   10:07 AM 06/18/2022    9:31 AM  CMP  Glucose 70 - 99 mg/dL 117  104  111   BUN 8 - 23 mg/dL _0 Creatinine 0.44 - 1.00 mg/dL 0.75  0.92  0.85   Sodium 135 - 145 mmol/L 137  134  136   Potassium 3.5 - 5.1 mmol/L 3.4  4.4  3.7   Chloride 98 - 111 mmol/L 103  100  102   CO2 22 - 32 mmol/L _1 Calcium 8.9 - 10.3 mg/dL 9.3  9.4  9.3   Total Protein 6.5 - 8.1 g/dL 6.7  7.5  7.1   Total Bilirubin 0.3 - 1.2 mg/dL 0.8  0.9  0.7   Alkaline Phos 38 - 126 U/L 104  117  111   AST 15 - 41 U/L _0 ALT 0 - 44 U/L _1 RADIOGRAPHIC STUDIES: I have personally reviewed the radiological images as listed and agreed with the findings in the report. No results found.    Orders Placed This Encounter  Procedures   CBC with Differential ([facility] Only)    Standing Status:   Future    Standing Expiration Date:   10/13/2023   CMP ([facility] only)    Standing Status:   Future    Standing Expiration Date:   10/13/2023   All questions were answered. The patient knows to call the clinic with any problems, questions or concerns. No barriers to learning was detected. The total time spent in the appointment was 30 minutes.     [name], MD 08/27/2022   I, Wilburn Mylar, am acting as scribe for [name], MD.   I have reviewed the above documentation for accuracy and completeness, and I agree with the above.

## 2022-08-27 NOTE — Patient Instructions (Signed)
[facility]  Discharge Instructions: Thank you for choosing [facility] to provide your oncology and hematology care.   If you have a lab appointment with the [facility], please go directly to the [facility] and check in at the registration area.   Wear comfortable clothing and clothing appropriate for easy access to any Portacath or PICC line.   We strive to give you quality time with your provider. You may need to reschedule your appointment if you arrive late (15 or more minutes).  Arriving late affects you and other patients whose appointments are after yours.  Also, if you miss three or more appointments without notifying the office, you may be dismissed from the clinic at the provider's discretion.      For prescription refill requests, have your pharmacy contact our office and allow 72 hours for refills to be completed.    Today you received the following chemotherapy and/or immunotherapy agents Bevacizumab, Oxaliplatin      To help prevent nausea and vomiting after your treatment, we encourage you to take your nausea medication as directed.  BELOW ARE SYMPTOMS THAT SHOULD BE REPORTED IMMEDIATELY: *FEVER GREATER THAN 100.4 F (38 C) OR HIGHER *CHILLS OR SWEATING *NAUSEA AND VOMITING THAT IS NOT CONTROLLED WITH YOUR NAUSEA MEDICATION *UNUSUAL SHORTNESS OF BREATH *UNUSUAL BRUISING OR BLEEDING *URINARY PROBLEMS (pain or burning when urinating, or frequent urination) *BOWEL PROBLEMS (unusual diarrhea, constipation, pain near the anus) TENDERNESS IN MOUTH AND THROAT WITH OR WITHOUT PRESENCE OF ULCERS (sore throat, sores in mouth, or a toothache) UNUSUAL RASH, SWELLING OR PAIN  UNUSUAL VAGINAL DISCHARGE OR ITCHING   Items with * indicate a potential emergency and should be followed up as soon as possible or go to the Emergency Department if any problems should occur.  Please show the CHEMOTHERAPY ALERT CARD or IMMUNOTHERAPY ALERT CARD  at check-in to the Emergency Department and triage nurse.  Should you have questions after your visit or need to cancel or reschedule your appointment, please contact [facility]  Dept: [phone]  and follow the prompts.  Office hours are 8:00 a.m. to 4:30 p.m. Monday - Friday. Please note that voicemails left after 4:00 p.m. may not be returned until the following business day.  We are closed weekends and major holidays. You have access to a nurse at all times for urgent questions. Please call the main number to the clinic Dept: [phone] and follow the prompts.   For any non-urgent questions, you may also contact your provider using MyChart. We now offer e-Visits for anyone 54 and older to request care online for non-urgent symptoms. For details visit [email or web address].   Also download the MyChart app! Go to the app store, search "MyChart", open the app, select [facility], and log in with your MyChart username and password.  Masks are optional in the cancer centers. If you would like for your care team to wear a mask while they are taking care of you, please let them know. For doctor visits, patients may have with them one support person who is at least 69 years old. At this time, visitors are not allowed in the infusion area.

## 2022-08-28 ENCOUNTER — Encounter: Payer: Self-pay | Admitting: Hematology

## 2022-08-28 NOTE — Progress Notes (Signed)
Nutrition Follow-up:  Patient with rectal cancer diagnosed 03/21. S/p LAR with end colostomy. She is currently receiving CapeOx + Bevacizumab q21d. Patient under are of Dr. Burr Medico.   Met with patient in infusion. She reports appetite improved and eating well in the last month. Patient has gained a couple of pounds and is pleased with this. She attributes previous weight loss related to husband being away from home working long hours and taking care of his father with dementia. Patient says she often did not want to eat when he was away. Husband is now working 2 days a week and home most of the time. Patient denies cold sensitivity, nausea, vomiting, diarrhea, constipation. She takes colace + miralax daily to ensure output is "mushy." Patient endorses diminished taste of some foods. She does not drink much water, recalls maybe 2 bottles a day. Patient likes soda.    Medications: reviewed   Labs: K 3.4, glucose 117  Anthropometrics: Weight 114 lb 8 oz today   8/9 - 112 lb   NUTRITION DIAGNOSIS: Unintended weight loss improved    INTERVENTION:  Encouraged high calorie, high protein foods 5-6 small meals to promote weight gain - handout with ideas provided  Discussed strategies for altered taste, suggested keeping variety of heat/serve meals to appeal to frequent changing flavor preferences - handout with tips provided Contact information given  Patient will work to increase intake of water    MONITORING, EVALUATION, GOAL: weight trends, intake   NEXT VISIT: To be scheduled as needed

## 2022-09-03 ENCOUNTER — Other Ambulatory Visit: Payer: Self-pay

## 2022-09-03 DIAGNOSIS — Z933 Colostomy status: Secondary | ICD-10-CM | POA: Diagnosis not present

## 2022-09-04 ENCOUNTER — Other Ambulatory Visit: Payer: Self-pay

## 2022-09-11 ENCOUNTER — Other Ambulatory Visit: Payer: Self-pay | Admitting: Nurse Practitioner

## 2022-09-15 ENCOUNTER — Other Ambulatory Visit: Payer: Self-pay

## 2022-09-21 MED FILL — Dexamethasone Sodium Phosphate Inj 100 MG/10ML: INTRAMUSCULAR | Qty: 1 | Status: AC

## 2022-09-22 ENCOUNTER — Inpatient Hospital Stay (HOSPITAL_BASED_OUTPATIENT_CLINIC_OR_DEPARTMENT_OTHER): Payer: Medicare HMO | Admitting: Hematology

## 2022-09-22 ENCOUNTER — Other Ambulatory Visit: Payer: Self-pay

## 2022-09-22 ENCOUNTER — Encounter: Payer: Self-pay | Admitting: Hematology

## 2022-09-22 ENCOUNTER — Inpatient Hospital Stay: Payer: Medicare HMO

## 2022-09-22 ENCOUNTER — Inpatient Hospital Stay: Payer: Medicare HMO | Attending: Hematology

## 2022-09-22 VITALS — BP 138/68 | HR 76 | Resp 17

## 2022-09-22 VITALS — BP 165/89 | HR 80 | Temp 97.5°F | Resp 19 | Ht 62.0 in | Wt 112.9 lb

## 2022-09-22 DIAGNOSIS — J4489 Other specified chronic obstructive pulmonary disease: Secondary | ICD-10-CM | POA: Insufficient documentation

## 2022-09-22 DIAGNOSIS — Z86718 Personal history of other venous thrombosis and embolism: Secondary | ICD-10-CM | POA: Insufficient documentation

## 2022-09-22 DIAGNOSIS — Z79899 Other long term (current) drug therapy: Secondary | ICD-10-CM | POA: Insufficient documentation

## 2022-09-22 DIAGNOSIS — Z7901 Long term (current) use of anticoagulants: Secondary | ICD-10-CM | POA: Diagnosis not present

## 2022-09-22 DIAGNOSIS — I10 Essential (primary) hypertension: Secondary | ICD-10-CM | POA: Diagnosis not present

## 2022-09-22 DIAGNOSIS — Z5112 Encounter for antineoplastic immunotherapy: Secondary | ICD-10-CM | POA: Diagnosis present

## 2022-09-22 DIAGNOSIS — C786 Secondary malignant neoplasm of retroperitoneum and peritoneum: Secondary | ICD-10-CM | POA: Insufficient documentation

## 2022-09-22 DIAGNOSIS — R69 Illness, unspecified: Secondary | ICD-10-CM | POA: Diagnosis not present

## 2022-09-22 DIAGNOSIS — M8580 Other specified disorders of bone density and structure, unspecified site: Secondary | ICD-10-CM | POA: Diagnosis not present

## 2022-09-22 DIAGNOSIS — F419 Anxiety disorder, unspecified: Secondary | ICD-10-CM | POA: Insufficient documentation

## 2022-09-22 DIAGNOSIS — C20 Malignant neoplasm of rectum: Secondary | ICD-10-CM

## 2022-09-22 DIAGNOSIS — Z5111 Encounter for antineoplastic chemotherapy: Secondary | ICD-10-CM | POA: Diagnosis present

## 2022-09-22 DIAGNOSIS — Z86711 Personal history of pulmonary embolism: Secondary | ICD-10-CM | POA: Diagnosis not present

## 2022-09-22 DIAGNOSIS — Z7952 Long term (current) use of systemic steroids: Secondary | ICD-10-CM | POA: Diagnosis not present

## 2022-09-22 DIAGNOSIS — C799 Secondary malignant neoplasm of unspecified site: Secondary | ICD-10-CM

## 2022-09-22 LAB — CBC WITH DIFFERENTIAL (CANCER CENTER ONLY)
Abs Immature Granulocytes: 0.02 10*3/uL (ref 0.00–0.07)
Basophils Absolute: 0.1 10*3/uL (ref 0.0–0.1)
Basophils Relative: 1 %
Eosinophils Absolute: 0.2 10*3/uL (ref 0.0–0.5)
Eosinophils Relative: 2 %
HCT: 36.3 % (ref 36.0–46.0)
Hemoglobin: 12.2 g/dL (ref 12.0–15.0)
Immature Granulocytes: 0 %
Lymphocytes Relative: 16 %
Lymphs Abs: 1.1 10*3/uL (ref 0.7–4.0)
MCH: 35.3 pg — ABNORMAL HIGH (ref 26.0–34.0)
MCHC: 33.6 g/dL (ref 30.0–36.0)
MCV: 104.9 fL — ABNORMAL HIGH (ref 80.0–100.0)
Monocytes Absolute: 0.5 10*3/uL (ref 0.1–1.0)
Monocytes Relative: 8 %
Neutro Abs: 4.8 10*3/uL (ref 1.7–7.7)
Neutrophils Relative %: 73 %
Platelet Count: 334 10*3/uL (ref 150–400)
RBC: 3.46 MIL/uL — ABNORMAL LOW (ref 3.87–5.11)
RDW: 14 % (ref 11.5–15.5)
WBC Count: 6.7 10*3/uL (ref 4.0–10.5)
nRBC: 0 % (ref 0.0–0.2)

## 2022-09-22 LAB — CMP (CANCER CENTER ONLY)
ALT: 6 U/L (ref 0–44)
AST: 15 U/L (ref 15–41)
Albumin: 3.9 g/dL (ref 3.5–5.0)
Alkaline Phosphatase: 124 U/L (ref 38–126)
Anion gap: 8 (ref 5–15)
BUN: 16 mg/dL (ref 8–23)
CO2: 27 mmol/L (ref 22–32)
Calcium: 9.2 mg/dL (ref 8.9–10.3)
Chloride: 105 mmol/L (ref 98–111)
Creatinine: 0.85 mg/dL (ref 0.44–1.00)
GFR, Estimated: >60 mL/min (ref >60)
Glucose, Bld: 105 mg/dL — ABNORMAL HIGH (ref 70–99)
Potassium: 3.6 mmol/L (ref 3.5–5.1)
Sodium: 140 mmol/L (ref 135–145)
Total Bilirubin: 0.5 mg/dL (ref 0.3–1.2)
Total Protein: 7.4 g/dL (ref 6.5–8.1)

## 2022-09-22 LAB — CEA (IN HOUSE-CHCC): CEA (CHCC-In House): 12.34 ng/mL — ABNORMAL HIGH (ref 0.00–5.00)

## 2022-09-22 LAB — TOTAL PROTEIN, URINE DIPSTICK: Protein, ur: 30 mg/dL — AB

## 2022-09-22 MED ORDER — CLONIDINE HCL 0.1 MG PO TABS
0.1000 mg | ORAL_TABLET | Freq: Once | ORAL | Status: AC
  Administered 2022-09-22: 0.1 mg via ORAL
  Filled 2022-09-22: qty 1

## 2022-09-22 MED ORDER — SODIUM CHLORIDE 0.9 % IV SOLN
10.0000 mg | Freq: Once | INTRAVENOUS | Status: AC
  Administered 2022-09-22: 10 mg via INTRAVENOUS
  Filled 2022-09-22: qty 10

## 2022-09-22 MED ORDER — SODIUM CHLORIDE 0.9% FLUSH
10.0000 mL | INTRAVENOUS | Status: DC | PRN
  Administered 2022-09-22: 10 mL

## 2022-09-22 MED ORDER — DEXTROSE 5 % IV SOLN: Freq: Once | INTRAVENOUS | Status: AC

## 2022-09-22 MED ORDER — SODIUM CHLORIDE 0.9% FLUSH
10.0000 mL | Freq: Once | INTRAVENOUS | Status: AC
  Administered 2022-09-22: 10 mL

## 2022-09-22 MED ORDER — PALONOSETRON HCL INJECTION 0.25 MG/5ML
0.2500 mg | Freq: Once | INTRAVENOUS | Status: AC
  Administered 2022-09-22: 0.25 mg via INTRAVENOUS
  Filled 2022-09-22: qty 5

## 2022-09-22 MED ORDER — OXALIPLATIN CHEMO INJECTION 100 MG/20ML
50.0000 mg/m2 | Freq: Once | INTRAVENOUS | Status: AC
  Administered 2022-09-22: 75 mg via INTRAVENOUS
  Filled 2022-09-22: qty 15

## 2022-09-22 MED ORDER — SODIUM CHLORIDE 0.9 % IV SOLN
7.5000 mg/kg | Freq: Once | INTRAVENOUS | Status: AC
  Administered 2022-09-22: 400 mg via INTRAVENOUS
  Filled 2022-09-22: qty 16

## 2022-09-22 MED ORDER — SODIUM CHLORIDE 0.9 % IV SOLN: Freq: Once | INTRAVENOUS | Status: AC

## 2022-09-22 MED ORDER — HEPARIN SOD (PORK) LOCK FLUSH 100 UNIT/ML IV SOLN
500.0000 [IU] | Freq: Once | INTRAVENOUS | Status: AC | PRN
  Administered 2022-09-22: 500 [IU]

## 2022-09-22 NOTE — Progress Notes (Signed)
Dr. Burr Medico made aware of elevated BP. Received VO to administer clonidine 0.1 and recheck BP. Start with Oxaliplatin.

## 2022-09-22 NOTE — Progress Notes (Signed)
[place]   [phone] [phone]   Clinic Follow up Note   Patient Care Team: [name], DO as PCP - General (Family Medicine) [name], MD as Consulting Physician (Hematology) Icard, Octavio Graves, DO as Consulting Physician (Pulmonary Disease) [facility] [name], MD as Consulting Physician (General Surgery) [name], MD as Consulting Physician (General Surgery) [name], [name], MD as Consulting Physician (Gastroenterology)  Date of Service:  09/22/2022  CHIEF COMPLAINT: f/u of colon cancer  CURRENT THERAPY:  CAPOX -Xeloda started 02/01/21, currently 1500 mg BID 7 days on/7 days off -Bevacizumab q2-3 weeks added with C2 02/24/21             -Beva held 09/02/21-12/28/21 d/t PE -Oxaliplatin added with C4 03/24/21, currently q3weeks  ASSESSMENT & PLAN:  Tracy [name] is a 71 y.o. female with   1. [name], MMR normal, peritoneal metastasis in 01/2021 -diagnosed 02/2020, s/p emergent surgery for perforation. Path showed invasive adenocarcinoma with perforation, +3/15 LNs, and clear margins. Initial CT scan negative for distant metastasis. She was not a candidate for adjuvant chemo due to very slow recovery from surgery -Guardant Reveal ctDNA were negative (06/06/20, 07/12/20, and 09/10/20). -surveillance colonoscopy by Dr. Havery Moros 01/06/21 showed new adenocarcinoma at appendiceal orifice. Biopsy confirmed adenocarcinoma -01/27/21 PET showed tumor in appendiceal orifice is likely peritoneal metastasis growing into the cecum.  Her peritoneal implants are mainly in the pelvis, difficult to biopsy -She began Xeloda on 02/01/21. Cycle 2 was changed to 1 week on/1 week off, and beva was added. She tolerates this well -PS improved and low dose oxaliplatin was added with C4, now on Capeox/beva q3 weeks, with continuing Xeloda 1 week on/1 week off. -Beva was held for PE found on  CT 08/2021, she is on xarelto. Resumed beva q3 weeks 12/29/21 -she twisted her ankle on 07/22/22, Oxaliplatin and beva were held and restarted on 9/14 . She continued Xeloda as prescribed through recovery.  -restaging CT CAP on 08/20/22 was stable, with no new or progressive findings. -labs reviewed, overall stable, urine protein 30, adequate to proceed with beva and oxali today    2. H/o Bilateral PE -incidental finding on CT 08/14/21 -no hypoxia, stable respiratory status. She began xarelto 08/15/21 -Beva was held through 12/28/21   3. Comorbidities: Asthma, COPD, anxiety, HTN -on trelegy, Claritin, flonase, and albuterol for her COPD/asthma per Dr. Valeta Harms. Uses albuterol nebulizer for exacerbation. -h/o Covid and URI 05/2021 -f/u pulmonology and PCP.      PLAN: -proceed with Beva and oxali today, we gave her clonidine 0.1 mg due to uncontrolled hypertension -continue Xeloda -lab, flush, f/u, and oxali/beva in 3 and 6 weeks             -she prefers Monday appointments   No problem-specific Assessment & Plan notes found for this encounter.   SUMMARY OF ONCOLOGIC HISTORY: Oncology History Overview Note  Cancer Staging Rectal cancer [facility]) Staging form: Colon and Rectum, AJCC 8th Edition - Pathologic stage from 03/03/2020: Stage IIIB (pT4a, pN1b, cM0) - Signed by [name], MD on 01/28/2021 Stage prefix: Initial diagnosis Histologic grading system: 4 grade system Histologic grade (G): G2 Residual tumor (R): R0 - None    Rectal cancer ([place])  03/03/2020 - 04/05/2020 Hospital Admission   She was admitted to ED on 03/03/20 for abdominal pain and nausea. She had been having diarrhea for several weeks. During hospital stay she developed acute respiratory failure, AKI,  RUE DVT from PICC line. Work up showed bowel perforation, small left liver mass and thickening of jejunum. She underwent emergent surgery on 03/03/20 for resection and colostomy placement. Her path showed invasive cancer, metastatic to  3/15 LNs. She had a NGT in placed but this was removed on POD 3. Post op her stoma became necrotic and septic. She had another bowel surgery on 03/12/20, which was NED with necrotic tissue.    03/03/2020 Imaging   CT AP 03/03/20  IMPRESSION: 1. Free intraperitoneal air consistent perforation. Most likely site of perforation is in the distal splenic flexure/proximal descending colon where there is a collection of air and gas measuring 6 centimeters. No obvious soft tissue mass identified in this region. At this site, there is abrupt transition of dilated, stool-filled colon to completely decompressed proximal descending colon. 2. Thickened, inflamed loops of jejunum are identified within the pelvis and are likely reactive. 3. Small hiatal hernia. 4. Benign-appearing 1.6 centimeter mass the LEFT hepatic lobe. Recommend comparison with prior studies if available. 5.  Emphysema (ICD10-J43.9). 6.  Aortic Atherosclerosis (ICD10-I70.0). 7. Bilateral renal scarring.   03/03/2020 Surgery   low anterior resection end colostomy by Dr Marcello Moores    03/03/2020 Initial Biopsy   FINAL MICROSCOPIC DIAGNOSIS: 03/03/20 A. RECTOSIGMOID COLON, LOW ANTERIOR RESECTION:  - Invasive colonic adenocarcinoma, 3.5 cm.  - Tumor invades the visceral peritoneum.  - Margins of resection are not involved.  - Metastatic carcinoma in (3) of (15) lymph nodes.  - See oncology table.   B. ADDITIONAL SIGMOID COLON, RESECTION:  - Colonic tissue, negative for carcinoma.  ADDENDUM:  Mismatch Repair Protein (IHC)   SUMMARY INTERPRETATION: NORMAL  There is preserved expression of the major MMR proteins. There is a very  low probability that microsatellite instability (MSI) is present.  However, certain clinically significant MMR protein mutations may result  in preservation of nuclear expression. It is recommended that the  preservation of protein expression be correlated with molecular based  MSI testing.   IHC EXPRESSION  RESULTS  TEST           RESULT  MLH1:          Preserved nuclear expression  MSH2:          Preserved nuclear expression  MSH6:          Preserved nuclear expression  PMS2:          Preserved nuclear expression   03/03/2020 Cancer Staging   Staging form: Colon and Rectum, AJCC 8th Edition - Pathologic stage from 03/03/2020: Stage IIIB (pT4a, pN1b, cM0) - Signed by [name], MD on 01/28/2021 Stage prefix: Initial diagnosis Histologic grading system: 4 grade system Histologic grade (G): G2 Residual tumor (R): R0 - None   03/08/2020 Imaging   CT AP 03/08/20 IMPRESSION: 1. Interval midline laparotomy with distal colon resection and diverting left lower quadrant colostomy. 2. Diffuse small bowel dilatation with gas fluid levels most consistent with postoperative ileus. 3. Trace ascites within the abdomen and pelvis. No fluid collection or abscess at this time. Surgical drain within the lower pelvis. 4. Indeterminate 1.4 cm subcapsular liver hypodensity. In light of newly diagnosed rectal cancer, metastatic disease cannot be excluded. PET CT may be useful for further evaluation. 5. Interval development of trace bilateral pleural effusions and diffuse body wall edema.   03/12/2020 Surgery   EXPLORATORY LAPAROTOMY WITH BOWEL RESECTION AND COLOSTOMY by Dr Hassell Done 03/12/20  FINAL MICROSCOPIC DIAGNOSIS: 03/12/20  A. COLON, SPLENIC FLEXURE, RESECTION:  -  Segment of colon (37 cm) with perforation and associated inflammation  - Multiple mucosal ulcers with necrotizing inflammation  - No evidence of malignancy    03/12/2020 Imaging   CT AP WO contrast 03/12/20  IMPRESSION: 1. Exam is limited by lack of intravenous contrast, motion, and artifact from the patient's arms adjacent to the torso. Since 03/08/2020, there has been development of relatively large pockets of intraperitoneal free air. While intraperitoneal gas would not be unexpected on postoperative day 9, this gas is new since  an intervening study of 03/08/2020 and given the relatively large volume certainly raises concern for bowel perforation. No source for the intraperitoneal free air is evident on this study. There is a surgical drain in the pelvis, but there is no free gas around the drain itself to suggest that it represents the source. 2. New circumferential wall thickening in the splenic flexure, descending colon and sigmoid colon leading into the end colostomy. Infection/inflammation would be a consideration. Ischemia cannot be excluded. 3. Relatively small volume intraperitoneal free fluid. High attenuation small fluid collections in the left upper abdomen may reflect hemorrhage, infection or residua from prior perforation. 4. Interval progression of diffuse body wall edema. 5. Residual contrast material in the renal parenchyma from prior imaging, compatible with renal dysfunction.    03/19/2020 Imaging   CT CAP 03/19/20  IMPRESSION: 1. 5.5 x 3.2 cm fluid collection is noted along the greater curvature of the proximal stomach. 2. Surgical drain is again noted in the pelvis with tip in left lower quadrant. 3. Interval development of crescent-shaped fluid collection measuring 16.4 x 3.3 cm in the epigastric region and left upper quadrant of the abdomen which may extend into the left lower quadrant. Potentially this may represent abscess or developing abscess. Multiple other smaller fluid collections are noted which may represent small abscesses. 4. 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 5. Mild amount of free fluid is noted in the posterior pelvis. 6. Moderate anasarca is noted. Aortic Atherosclerosis (ICD10-I70.0).   04/12/2020 Imaging   CT AP 04/12/20  IMPRESSION: 1. Fluid collections within the LEFT abdomen have significantly decreased compared to previous CT exams, now nearly completely resolved. 2. Percutaneous drainage catheter with tip coiled posterior to the LEFT kidney, stable  positioning compared to the previous study. The more anterior catheter has been removed. 3. Small amount of free fluid persists within the abdomen and pelvis. 4. Trace bibasilar pleural effusions. 5. Anasarca. 6. While reviewing today's study, comparing with a chest/abdomen/pelvis CT from earlier same month, there is question of thrombus in the LEFT internal jugular vein. Recommend ultrasound of the LEFT IJ to exclude DVT. This recommendation discussed with patient's hospitalist, Dr. Owens Shark, on 04/12/2020 at 4:20 p.m.   Emphysema (ICD10-J43.9).   04/22/2020 Imaging   CT AP 04/22/20  IMPRESSION: 1. No recurrent intra-abdominal abscess status post interval removal of left retroperitoneal percutaneous drain. Stable small amount of free pelvic fluid. 2. Enlarging dependent bilateral pleural effusions, now moderate in volume. Associated atelectasis at both lung bases. 3. Progressive anasarca with generalized edema throughout the subcutaneous fat. 4. Stable small subcapsular fluid collection along the anterior aspect of the left hepatic lobe. 5. Aortic Atherosclerosis (ICD10-I70.0).   05/23/2020 Initial Diagnosis   Rectal cancer ([place])   01/14/2021 Imaging   CT CAP IMPRESSION: -5.0 cm soft tissue mass along the posterior aspect of the cecum at the base of the appendix, favored to reflect a peritoneal/serosal implant, corresponding to the patient's known adenocarcinoma. This is new  from the prior. -Suspected additional pelvic implants along the uterus and right adnexa, poorly visualized, new/progressive from the prior. -Additional pericapsular lesion along the lateral spleen is mildly progressive, suspicious for additional peritoneal implant. -No evidence of metastatic disease in the chest.   01/27/2021 PET scan   IMPRESSION:  1. Extensive hypermetabolic peritoneal metastasis, as detailed  above.  2. No evidence of hypermetabolic supradiaphragmatic disease.  3. Diffuse low-level  thyroid hypermetabolism can be seen in the  setting of thyroiditis. Consider correlation with thyroid function  labs.  4. Aortic atherosclerosis (ICD10-I70.0) and emphysema (ICD10-J43.9).    02/01/2021 -  Chemotherapy    First-line chemo Xeloda 1500 mg twice daily for day 1-14, every 21 days, started on 02/01/2021. Reduced to 1 week on/ 1 week off and added bevacizumab q2weeks from C2 on 02/24/21.  ----Given good tolerance, I changed to CAPOX and bevacizumab q2weeks from C4 on 03/24/21 with Xeloda 158m in the AM and 10044min the PM 1 week on/1 week off.     03/24/2021 -  Chemotherapy   Patient is on Treatment Plan : COLORECTAL CapeOx + Bevacizumab q21d     05/08/2021 Imaging   PET  IMPRESSION: 1. There is been interval development of several FDG avid pulmonary nodules within both lungs. Additionally, there is new FDG avid low left paratracheal lymph node. Thoracic metastasis cannot be excluded. 2. Interval improvement in multifocal FDG avid peritoneal metastasis as detailed above.     08/14/2021 Imaging   CT CAP  IMPRESSION: Resolution of previously seen small bilateral pulmonary nodules since previous study.   Stable 8 mm mediastinal lymph node in the AP window.   No evidence of metastatic disease within the abdomen or pelvis.   Nonocclusive pulmonary embolism in bilateral central lower lobe pulmonary arteries, which appears subacute in age.   12/25/2021 Imaging   EXAM: CT CHEST, ABDOMEN, AND PELVIS WITH CONTRAST  IMPRESSION: 1. Minimal, bandlike residua of previously seen FDG avid pulmonary nodules, consistent with treated metastases. No new pulmonary nodules. 2. Unchanged post treatment appearance of the cecal base, with soft tissue thickening about the cecal base and adjacent pelvis, previously FDG avid and in keeping with known appendiceal orifice malignancy. 3. Unchanged central fluid attenuation lesions about the spleen, left iliopsoas, and left pelvic sidewall,  previously FDG avid, consistent with treated peritoneal implants. 4. No evidence of new metastatic disease in the chest, abdomen, or pelvis 5. Status post rectosigmoid colon resection with left lower quadrant end colostomy.   Aortic Atherosclerosis (ICD10-I70.0).   Malignant [place]oplasm of [place] 01/06/2021 Procedure   (surveillance colonoscopy for h/o rectal cancer) Impression:  - Preparation of the colon was fair, lavage performed with mostly adequate views. - Polypoid lesion obliterating appendiceal orifice. Biopsied to evaluate for malignancy. - The examined portion of the ileum was normal. - Two large polyps in the cecum, not removed today pending path at appendiceal orifice, bowel prep. If removed endoscopically in the future would favor EMR to be done at the hospital. - One 5 mm polyp at the ileocecal valve, removed with a cold snare. Resected and retrieved. - One 3 mm polyp in the transverse colon, removed with a cold snare. Resected and retrieved. - Parastomal hernia making initial entry to the distal colon challenging. - The examination was otherwise normal.   01/06/2021 Initial Biopsy   Diagnosis 1. Colon, biopsy, appendiceal oraface - ADENOCARCINOMA. 2. Colon, polyp(s), ileocecal valve, transverse, x2 - TUBULAR ADENOMA, NEGATIVE FOR HIGH GRADE DYSPLASIA (X1). - COLONIC  MUCOSA WITH UNDERLYING LYMPHOID AGGREGATE, NEGATIVE FOR DYSPLASIA (X1).  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 This is new from the  prior. -Suspected additional pelvic implants along the uterus and right adnexa, poorly visualized, new/progressive from the prior. -Additional pericapsular lesion along the lateral spleen is mildly progressive, suspicious for additional peritoneal implant. -No evidence of metastatic disease in the chest.   08/14/2021 Imaging   CT CAP IMPRESSION: Resolution of previously seen small bilateral pulmonary nodules since previous study. Stable 8 mm mediastinal lymph node in the AP window. No evidence of metastatic disease within the abdomen or pelvis. Nonocclusive pulmonary embolism in bilateral central lower lobe pulmonary arteries, which appears subacute in age.  Aortic Atherosclerosis (ICD10-I70.0) and Emphysema (ICD10-J43.9).     08/14/2021 Imaging   CT CAP  IMPRESSION: Resolution of previously seen small bilateral pulmonary nodules since previous study.   Stable 8 mm mediastinal lymph node in the AP window.   No evidence of metastatic disease within the abdomen or pelvis.   Nonocclusive pulmonary embolism in bilateral central lower lobe pulmonary arteries, which appears subacute in age.   12/25/2021 Imaging   EXAM: CT CHEST, ABDOMEN, AND PELVIS WITH CONTRAST  IMPRESSION: 1. Minimal, bandlike residua of previously seen FDG avid pulmonary nodules, consistent with treated metastases. No new pulmonary nodules. 2. Unchanged post treatment appearance of the cecal base, with soft tissue thickening about the cecal base and adjacent pelvis, previously FDG avid and in keeping with known appendiceal orifice malignancy. 3. Unchanged central fluid attenuation lesions about the spleen, left iliopsoas, and left pelvic sidewall, previously FDG avid, consistent with treated peritoneal implants. 4. No evidence of new metastatic disease in the chest, abdomen, or pelvis 5. Status post rectosigmoid colon resection with left lower quadrant end colostomy.   Aortic Atherosclerosis (ICD10-I70.0).       INTERVAL HISTORY:  Tracy [name] is here for a follow up of colon cancer. She was last seen by me on 08/27/22. She presents to the clinic alone. She reports she did well with restarting treatment and is feeling well.  She reports she is having new bladder issues; she explains she is occasionally leaking urine requiring her to wear pads. She adds it's very intermittent and without warning. She denies any pain or burning.   All other systems were reviewed with the patient and are negative.  MEDICAL HISTORY:  Past Medical History:  Diagnosis Date   Acute deep vein thrombosis (DVT) of brachial vein of right upper extremity (HCC)    Acute on chronic respiratory failure with hypoxia (HCC)    Asthma    Cataract    CHICKENPOX, HX OF 01/06/2011   Qualifier: Diagnosis of  By: Charlett Blake MD, Stacey     Colon cancer [facility])    COPD (chronic obstructive pulmonary disease) ([place]) 2011   FeV1 31% predicted FeV1/FVX 47 %   COVID-19 05/2021   Essential hypertension 05/23/2020   Hemorrhoid    Open right radial fracture 2020   Osteopenia    Perforated sigmoid colon ([place]) 03/03/2020  Pleural effusion    Pneumonia 2021   Postoperative intra-abdominal abscess 2021   Rectal cancer (HCC)    Seasonal allergies    takes Claritin daily prn   Shock circulatory ([place])    Tracheostomy status ([place])    Vitamin D deficiency    takes Vit d every 14 days    SURGICAL HISTORY: Past Surgical History:  Procedure Laterality Date   AUGMENTATION MAMMAPLASTY     saline   EXAMINATION UNDER ANESTHESIA  10/14/2012   Procedure: EXAM UNDER ANESTHESIA;  Surgeon: Gayland Curry, MD,FACS;  Location: [facility];  Service: General;  Laterality: N/A;  rectal exam under anesthesia excisional hemorrhoidectomy hemorrhoidal banding x two   EXAMINATION UNDER ANESTHESIA  02/03/2013   excision hemorrhoidal tissue   FOOT SURGERY     left bunionectomy   HEMORRHOIDECTOMY WITH HEMORRHOID BANDING  10/14/2012   Procedure: HEMORRHOIDECTOMY  WITH HEMORRHOID BANDING;  Surgeon: Gayland Curry, MD,FACS;  Location: [facility];  Service: General;  Laterality: N/A;   IR IMAGING GUIDED PORT INSERTION  02/03/2021   IR THORACENTESIS ASP PLEURAL SPACE W/IMG GUIDE  04/23/2020   LAPAROTOMY N/A 03/03/2020   Procedure: low anterior resection end colostomy;  Surgeon: [name], MD;  Location: [facility];  Service: General;  Laterality: N/A;   LAPAROTOMY N/A 03/12/2020   Procedure: EXPLORATORY LAPAROTOMY WITH BOWEL RESECTION AND COLOSTOMY;  Surgeon: [name], MD;  Location: [facility];  Service: General;  Laterality: N/A;   OPEN REDUCTION INTERNAL FIXATION (ORIF) DISTAL RADIAL FRACTURE Right 11/01/2018   Procedure: OPEN REDUCTION INTERNAL FIXATION (ORIF) DISTAL RADIAL FRACTURE;  Surgeon: [name], MD;  Location: [facility];  Service: Orthopedics;  Laterality: Right;   TONSILLECTOMY  1960   recurrent otitis media   US ECHOCARDIOGRAPHY  02/2020    poor windows, normal LV function, severely dilated RV with moderately reduced function, RV volume and pressure overload, mildly dilated RA    I have reviewed the social history and family history with the patient and they are unchanged from previous note.  ALLERGIES:  is allergic to amlodipine and codeine.  MEDICATIONS:  Current Outpatient Medications  Medication Sig Dispense Refill   acetaminophen (TYLENOL) 325 MG tablet Take 2 tablets (650 mg total) by mouth every 6 (six) hours as needed for mild pain. 30 tablet 0   albuterol (PROVENTIL) (2.5 MG/3ML) 0.083% nebulizer solution Take 3 mLs (2.5 mg total) by nebulization every 4 (four) hours as needed for wheezing or shortness of breath. DX: J44.9 360 mL 5   albuterol (VENTOLIN HFA) 108 (90 Base) MCG/ACT inhaler Inhale 2 puffs into the lungs every 6 (six) hours as needed for wheezing or shortness of breath. 8 g 5   ALPRAZolam (XANAX) 0.25 MG tablet Take 1 tablet (0.25 mg total) by mouth at bedtime as needed for anxiety. 30 tablet 0    capecitabine (XELODA) 500 MG tablet Take 3 tablets every 12 hours. Take for 7 days on, 7 days off, repeat every 14 days. 84 tablet 2   docusate sodium (COLACE) 50 MG capsule Take 50 mg by mouth daily as needed for mild constipation.     doxycycline (VIBRA-TABS) 100 MG tablet Take 1 tablet (100 mg total) by mouth 2 (two) times daily. 14 tablet 0   fluticasone (FLONASE) 50 MCG/ACT nasal spray Place 2 sprays into both nostrils daily. 16 mL 5   Fluticasone-Umeclidin-Vilant (TRELEGY ELLIPTA) 100-62.5-25 MCG/INH AEPB Inhale 1 puff into the lungs daily. 60 each 5   HYDROcodone bit-homatropine (HYCODAN) 5-1.5  MG/5ML syrup Take 5 mLs by mouth every 6 (six) hours as needed for cough. 240 mL 0   lidocaine-prilocaine (EMLA) cream Apply 1 application. topically as needed. 30 g 2   loratadine (CLARITIN) 10 MG tablet Take 1 tablet (10 mg total) by mouth daily. 90 tablet 3   losartan (COZAAR) 25 MG tablet TAKE 1 TABLET (25 MG TOTAL) BY MOUTH DAILY. 30 tablet 1   ondansetron (ZOFRAN) 8 MG tablet Take 1 tablet (8 mg total) by mouth every 8 (eight) hours as needed for nausea or vomiting. 20 tablet 2   predniSONE (DELTASONE) 10 MG tablet Take 4 tabs by mouth once daily x4 days, then 3 tabs x4 days, 2 tabs x4 days, 1 tab x4 days and stop. 40 tablet 0   prochlorperazine (COMPAZINE) 10 MG tablet Take 1 tablet (10 mg total) by mouth every 6 (six) hours as needed for nausea or vomiting. 30 tablet 2   rivaroxaban (XARELTO) 20 MG TABS tablet Take 1 tablet (20 mg total) by mouth daily with supper. 90 tablet 1   No current facility-administered medications for this visit.   Facility-Administered Medications Ordered in Other Visits  Medication Dose Route Frequency Provider Last Rate Last Admin   0.9 %  sodium chloride infusion   Intravenous Once [name], MD       bevacizumab-bvzr (ZIRABEV) 400 mg in sodium chloride 0.9 % 100 mL chemo infusion  7.5 mg/kg (Treatment Plan Recorded) Intravenous Once [name], MD       heparin  lock flush 100 unit/mL  500 Units Intracatheter Once PRN [name], MD       oxaliplatin (ELOXATIN) 75 mg in dextrose 5 % 500 mL chemo infusion  50 mg/m2 (Treatment Plan Recorded) Intravenous Once [name], MD 258 mL/hr at 09/22/22 1327 75 mg at 09/22/22 1327   sodium chloride flush (NS) 0.9 % injection 10 mL  10 mL Intracatheter PRN [name], MD        PHYSICAL EXAMINATION: ECOG PERFORMANCE STATUS: 1 - Symptomatic but completely ambulatory  Vitals:   09/22/22 1112  BP: (!) 165/89  Pulse: 80  Resp: 19  Temp: (!) 97.5 F (36.4 C)  SpO2: 97%   Wt Readings from Last 3 Encounters:  09/22/22 112 lb 14.4 oz (51.2 kg)  08/27/22 114 lb 8 oz (51.9 kg)  07/22/22 112 lb (50.8 kg)     GENERAL:alert, no distress and comfortable SKIN: skin color normal, no rashes or significant lesions EYES: normal, Conjunctiva are pink and non-injected, sclera clear  NEURO: alert & oriented x 3 with fluent speech  LABORATORY DATA:  I have reviewed the data as listed    Latest Ref Rng & Units 09/22/2022   10:36 AM 08/27/2022    9:39 AM 07/13/2022   10:07 AM  CBC  WBC 4.0 - 10.5 K/uL 6.7  8.0  7.3   Hemoglobin 12.0 - 15.0 g/dL 12.2  11.8  12.7   Hematocrit 36.0 - 46.0 % 36.3  34.8  36.5   Platelets 150 - 400 K/uL 334  224  261         Latest Ref Rng & Units 09/22/2022   10:36 AM 08/27/2022    9:39 AM 07/13/2022   10:07 AM  CMP  Glucose 70 - 99 mg/dL 105  117  104   BUN 8 - 23 mg/dL 16  16  18    Creatinine 0.44 - 1.00 mg/dL 0.85  0.75  0.92   Sodium 135 - 145  mmol/L 140  137  134   Potassium 3.5 - 5.1 mmol/L 3.6  3.4  4.4   Chloride 98 - 111 mmol/L 105  103  100   CO2 22 - 32 mmol/L 27  26  24    Calcium 8.9 - 10.3 mg/dL 9.2  9.3  9.4   Total Protein 6.5 - 8.1 g/dL 7.4  6.7  7.5   Total Bilirubin 0.3 - 1.2 mg/dL 0.5  0.8  0.9   Alkaline Phos 38 - 126 U/L 124  104  117   AST 15 - 41 U/L 15  19  19    ALT 0 - 44 U/L 6  9  9        RADIOGRAPHIC STUDIES: I have personally reviewed the  radiological images as listed and agreed with the findings in the report. No results found.    Orders Placed This Encounter  Procedures   CBC with Differential ([facility] Only)    Standing Status:   Future    Standing Expiration Date:   11/03/2023   CMP ([facility] only)    Standing Status:   Future    Standing Expiration Date:   11/03/2023   CBC with Differential ([facility] Only)    Standing Status:   Future    Standing Expiration Date:   11/24/2023   CMP ([facility] only)    Standing Status:   Future    Standing Expiration Date:   11/24/2023   Total Protein, Urine dipstick    Standing Status:   Future    Standing Expiration Date:   11/24/2023   All questions were answered. The patient knows to call the clinic with any problems, questions or concerns. No barriers to learning was detected. The total time spent in the appointment was 30 minutes.     [name], MD 09/22/2022   I, Wilburn Mylar, am acting as scribe for [name], MD.   I have reviewed the above documentation for accuracy and completeness, and I agree with the above.

## 2022-09-30 ENCOUNTER — Other Ambulatory Visit: Payer: Self-pay

## 2022-10-07 ENCOUNTER — Other Ambulatory Visit: Payer: Self-pay | Admitting: Nurse Practitioner

## 2022-10-09 MED FILL — Dexamethasone Sodium Phosphate Inj 100 MG/10ML: INTRAMUSCULAR | Qty: 1 | Status: AC

## 2022-10-11 ENCOUNTER — Other Ambulatory Visit: Payer: Self-pay

## 2022-10-12 ENCOUNTER — Inpatient Hospital Stay: Payer: Medicare HMO

## 2022-10-12 ENCOUNTER — Inpatient Hospital Stay: Payer: Medicare HMO | Admitting: Hematology

## 2022-10-12 ENCOUNTER — Encounter: Payer: Self-pay | Admitting: Hematology

## 2022-10-12 VITALS — BP 152/79 | HR 89 | Temp 97.5°F | Resp 18 | Ht 62.0 in | Wt 116.4 lb

## 2022-10-12 VITALS — BP 149/82 | HR 70 | Resp 16

## 2022-10-12 DIAGNOSIS — R69 Illness, unspecified: Secondary | ICD-10-CM | POA: Diagnosis not present

## 2022-10-12 DIAGNOSIS — C181 Malignant neoplasm of appendix: Secondary | ICD-10-CM | POA: Diagnosis not present

## 2022-10-12 DIAGNOSIS — C20 Malignant neoplasm of rectum: Secondary | ICD-10-CM | POA: Diagnosis not present

## 2022-10-12 DIAGNOSIS — I10 Essential (primary) hypertension: Secondary | ICD-10-CM | POA: Diagnosis not present

## 2022-10-12 DIAGNOSIS — Z86711 Personal history of pulmonary embolism: Secondary | ICD-10-CM | POA: Diagnosis not present

## 2022-10-12 DIAGNOSIS — Z79899 Other long term (current) drug therapy: Secondary | ICD-10-CM | POA: Diagnosis not present

## 2022-10-12 DIAGNOSIS — J4489 Other specified chronic obstructive pulmonary disease: Secondary | ICD-10-CM | POA: Diagnosis not present

## 2022-10-12 DIAGNOSIS — Z7901 Long term (current) use of anticoagulants: Secondary | ICD-10-CM | POA: Diagnosis not present

## 2022-10-12 DIAGNOSIS — C799 Secondary malignant neoplasm of unspecified site: Secondary | ICD-10-CM

## 2022-10-12 DIAGNOSIS — Z7952 Long term (current) use of systemic steroids: Secondary | ICD-10-CM | POA: Diagnosis not present

## 2022-10-12 DIAGNOSIS — Z5112 Encounter for antineoplastic immunotherapy: Secondary | ICD-10-CM | POA: Diagnosis not present

## 2022-10-12 DIAGNOSIS — Z86718 Personal history of other venous thrombosis and embolism: Secondary | ICD-10-CM | POA: Diagnosis not present

## 2022-10-12 DIAGNOSIS — Z5111 Encounter for antineoplastic chemotherapy: Secondary | ICD-10-CM | POA: Diagnosis not present

## 2022-10-12 DIAGNOSIS — C786 Secondary malignant neoplasm of retroperitoneum and peritoneum: Secondary | ICD-10-CM | POA: Diagnosis not present

## 2022-10-12 DIAGNOSIS — M8580 Other specified disorders of bone density and structure, unspecified site: Secondary | ICD-10-CM | POA: Diagnosis not present

## 2022-10-12 LAB — CMP (CANCER CENTER ONLY)
ALT: 10 U/L (ref 0–44)
AST: 23 U/L (ref 15–41)
Albumin: 4 g/dL (ref 3.5–5.0)
Alkaline Phosphatase: 131 U/L — ABNORMAL HIGH (ref 38–126)
Anion gap: 9 (ref 5–15)
BUN: 19 mg/dL (ref 8–23)
CO2: 26 mmol/L (ref 22–32)
Calcium: 9.2 mg/dL (ref 8.9–10.3)
Chloride: 102 mmol/L (ref 98–111)
Creatinine: 0.91 mg/dL (ref 0.44–1.00)
GFR, Estimated: >60 mL/min (ref >60)
Glucose, Bld: 114 mg/dL — ABNORMAL HIGH (ref 70–99)
Potassium: 3.9 mmol/L (ref 3.5–5.1)
Sodium: 137 mmol/L (ref 135–145)
Total Bilirubin: 0.8 mg/dL (ref 0.3–1.2)
Total Protein: 7.4 g/dL (ref 6.5–8.1)

## 2022-10-12 LAB — CBC WITH DIFFERENTIAL (CANCER CENTER ONLY)
Abs Immature Granulocytes: 0.01 10*3/uL (ref 0.00–0.07)
Basophils Absolute: 0 10*3/uL (ref 0.0–0.1)
Basophils Relative: 1 %
Eosinophils Absolute: 0.3 10*3/uL (ref 0.0–0.5)
Eosinophils Relative: 4 %
HCT: 37.9 % (ref 36.0–46.0)
Hemoglobin: 12.7 g/dL (ref 12.0–15.0)
Immature Granulocytes: 0 %
Lymphocytes Relative: 19 %
Lymphs Abs: 1.3 10*3/uL (ref 0.7–4.0)
MCH: 35.4 pg — ABNORMAL HIGH (ref 26.0–34.0)
MCHC: 33.5 g/dL (ref 30.0–36.0)
MCV: 105.6 fL — ABNORMAL HIGH (ref 80.0–100.0)
Monocytes Absolute: 0.7 10*3/uL (ref 0.1–1.0)
Monocytes Relative: 10 %
Neutro Abs: 4.6 10*3/uL (ref 1.7–7.7)
Neutrophils Relative %: 66 %
Platelet Count: 262 10*3/uL (ref 150–400)
RBC: 3.59 MIL/uL — ABNORMAL LOW (ref 3.87–5.11)
RDW: 14.8 % (ref 11.5–15.5)
WBC Count: 6.9 10*3/uL (ref 4.0–10.5)
nRBC: 0 % (ref 0.0–0.2)

## 2022-10-12 LAB — TOTAL PROTEIN, URINE DIPSTICK: Protein, ur: 30 mg/dL — AB

## 2022-10-12 LAB — CEA (IN HOUSE-CHCC): CEA (CHCC-In House): 16.18 ng/mL — ABNORMAL HIGH (ref 0.00–5.00)

## 2022-10-12 MED ORDER — OXALIPLATIN CHEMO INJECTION 100 MG/20ML
50.0000 mg/m2 | Freq: Once | INTRAVENOUS | Status: AC
  Administered 2022-10-12: 75 mg via INTRAVENOUS
  Filled 2022-10-12: qty 15

## 2022-10-12 MED ORDER — SODIUM CHLORIDE 0.9 % IV SOLN
10.0000 mg | Freq: Once | INTRAVENOUS | Status: AC
  Administered 2022-10-12: 10 mg via INTRAVENOUS
  Filled 2022-10-12: qty 10

## 2022-10-12 MED ORDER — DEXTROSE 5 % IV SOLN: Freq: Once | INTRAVENOUS | Status: AC

## 2022-10-12 MED ORDER — SODIUM CHLORIDE 0.9% FLUSH
10.0000 mL | Freq: Once | INTRAVENOUS | Status: AC
  Administered 2022-10-12: 10 mL

## 2022-10-12 MED ORDER — PALONOSETRON HCL INJECTION 0.25 MG/5ML
0.2500 mg | Freq: Once | INTRAVENOUS | Status: AC
  Administered 2022-10-12: 0.25 mg via INTRAVENOUS
  Filled 2022-10-12: qty 5

## 2022-10-12 MED ORDER — HEPARIN SOD (PORK) LOCK FLUSH 100 UNIT/ML IV SOLN
500.0000 [IU] | Freq: Once | INTRAVENOUS | Status: AC | PRN
  Administered 2022-10-12: 500 [IU]

## 2022-10-12 MED ORDER — SODIUM CHLORIDE 0.9% FLUSH
10.0000 mL | INTRAVENOUS | Status: DC | PRN
  Administered 2022-10-12: 10 mL

## 2022-10-12 MED ORDER — SODIUM CHLORIDE 0.9 % IV SOLN
7.5000 mg/kg | Freq: Once | INTRAVENOUS | Status: AC
  Administered 2022-10-12: 400 mg via INTRAVENOUS
  Filled 2022-10-12: qty 16

## 2022-10-12 MED ORDER — SODIUM CHLORIDE 0.9 % IV SOLN: Freq: Once | INTRAVENOUS | Status: AC

## 2022-10-12 NOTE — Patient Instructions (Signed)
[facility]  Discharge Instructions: Thank you for choosing [facility] to provide your oncology and hematology care.   If you have a lab appointment with the [facility], please go directly to the [facility] and check in at the registration area.   Wear comfortable clothing and clothing appropriate for easy access to any Portacath or PICC line.   We strive to give you quality time with your provider. You may need to reschedule your appointment if you arrive late (15 or more minutes).  Arriving late affects you and other patients whose appointments are after yours.  Also, if you miss three or more appointments without notifying the office, you may be dismissed from the clinic at the provider's discretion.      For prescription refill requests, have your pharmacy contact our office and allow 72 hours for refills to be completed.    Today you received the following chemotherapy and/or immunotherapy agents: bevacizumab/oxaliplatin      To help prevent nausea and vomiting after your treatment, we encourage you to take your nausea medication as directed.  BELOW ARE SYMPTOMS THAT SHOULD BE REPORTED IMMEDIATELY: *FEVER GREATER THAN 100.4 F (38 C) OR HIGHER *CHILLS OR SWEATING *NAUSEA AND VOMITING THAT IS NOT CONTROLLED WITH YOUR NAUSEA MEDICATION *UNUSUAL SHORTNESS OF BREATH *UNUSUAL BRUISING OR BLEEDING *URINARY PROBLEMS (pain or burning when urinating, or frequent urination) *BOWEL PROBLEMS (unusual diarrhea, constipation, pain near the anus) TENDERNESS IN MOUTH AND THROAT WITH OR WITHOUT PRESENCE OF ULCERS (sore throat, sores in mouth, or a toothache) UNUSUAL RASH, SWELLING OR PAIN  UNUSUAL VAGINAL DISCHARGE OR ITCHING   Items with * indicate a potential emergency and should be followed up as soon as possible or go to the Emergency Department if any problems should occur.  Please show the CHEMOTHERAPY ALERT CARD or IMMUNOTHERAPY ALERT CARD  at check-in to the Emergency Department and triage nurse.  Should you have questions after your visit or need to cancel or reschedule your appointment, please contact [facility]  Dept: [phone]  and follow the prompts.  Office hours are 8:00 a.m. to 4:30 p.m. Monday - Friday. Please note that voicemails left after 4:00 p.m. may not be returned until the following business day.  We are closed weekends and major holidays. You have access to a nurse at all times for urgent questions. Please call the main number to the clinic Dept: [phone] and follow the prompts.   For any non-urgent questions, you may also contact your provider using MyChart. We now offer e-Visits for anyone 57 and older to request care online for non-urgent symptoms. For details visit [email or web address].   Also download the MyChart app! Go to the app store, search "MyChart", open the app, select [facility], and log in with your MyChart username and password.  Masks are optional in the cancer centers. If you would like for your care team to wear a mask while they are taking care of you, please let them know. You may have one support person who is at least 69 years old accompany you for your appointments.

## 2022-10-12 NOTE — Progress Notes (Signed)
[place]   [phone] [phone]   Clinic Follow up Note   Patient Care Team: [name], DO as PCP - General (Family Medicine) [name], MD as Consulting Physician (Hematology) Icard, Octavio Graves, DO as Consulting Physician (Pulmonary Disease) [place], P.A. [name], MD as Consulting Physician (General Surgery) [name], MD as Consulting Physician (General Surgery) [name], [name], MD as Consulting Physician (Gastroenterology)  Date of Service:  10/12/2022  CHIEF COMPLAINT: f/u of colon cancer  CURRENT THERAPY:  CAPOX -Xeloda started 02/01/21, currently 1500 mg BID 7 days on/7 days off -Bevacizumab q2-3 weeks added with C2 02/24/21             -Beva held 09/02/21 - 12/28/21 d/t PE -Oxaliplatin added with C4 03/24/21, currently q3weeks  ASSESSMENT & PLAN:  Tracy [name] is a 71 y.o. female with   1. [name], MMR normal, peritoneal metastasis in 01/2021 -diagnosed 02/2020, s/p emergent surgery for perforation. Path showed invasive adenocarcinoma with perforation, +3/15 LNs, and clear margins. Initial CT scan negative for distant metastasis. She was not a candidate for adjuvant chemo due to very slow recovery from surgery -Guardant Reveal ctDNA were negative (06/06/20, 07/12/20, and 09/10/20). -surveillance colonoscopy by Dr. Havery Moros 01/06/21 showed new adenocarcinoma at appendiceal orifice. Biopsy confirmed adenocarcinoma -01/27/21 PET showed tumor in appendiceal orifice is likely peritoneal metastasis growing into the cecum. Her peritoneal implants are mainly in the pelvis, difficult to biopsy -She began Xeloda on 02/01/21. Cycle 2 was changed to 1 week on/1 week off, and beva was added. She tolerates this well -PS improved and low dose oxaliplatin was added with C4, now on Capeox/beva q3 weeks, with continuing Xeloda 1 week on/1 week off. -Beva was held for PE found on  CT 08/2021, she is on xarelto. Resumed beva q3 weeks 12/29/21 -she twisted her ankle on 07/22/22, Oxaliplatin and beva were held and restarted on 9/14. She continued Xeloda as prescribed through recovery.  -restaging CT CAP on 08/20/22 was stable, with no new or progressive findings. Plan to repeat in 11/2022; I ordered today. -she is clinically stable and has gained some weight back lately. Labs reviewed, overall stable, adequate to proceed with beva and oxali today    2. H/o Bilateral PE -incidental finding on CT 08/14/21 -no hypoxia, stable respiratory status. She began xarelto 08/15/21 -Beva was held through 12/28/21   3. Comorbidities: Asthma, COPD, anxiety, HTN -on trelegy, Claritin, flonase, and albuterol for her COPD/asthma per Dr. Valeta Harms. Uses albuterol nebulizer for exacerbation. -h/o Covid and URI 05/2021 -f/u pulmonology and PCP.      PLAN: -proceed with Beva and oxali today -continue Xeloda -She will increase losartan to 50 mg daily -lab, flush, f/u, and oxali/beva in 3 and 6 weeks             -she prefers Monday appointments -CT to be done in 5-6 weeks   No problem-specific Assessment & Plan notes found for this encounter.   SUMMARY OF ONCOLOGIC HISTORY: Oncology History Overview Note  Cancer Staging Rectal cancer [facility]) Staging form: Colon and Rectum, AJCC 8th Edition - Pathologic stage from 03/03/2020: Stage IIIB (pT4a, pN1b, cM0) - Signed by [name], MD on 01/28/2021 Stage prefix: Initial diagnosis Histologic grading system: 4 grade system Histologic grade (G): G2 Residual tumor (R): R0 - None    Rectal cancer ([place])  03/03/2020 - 04/05/2020 Hospital Admission   She was admitted to ED on 03/03/20 for  abdominal pain and nausea. She had been having diarrhea for several weeks. During hospital stay she developed acute respiratory failure, AKI, RUE DVT from PICC line. Work up showed bowel perforation, small left liver mass and thickening of jejunum. She underwent emergent surgery  on 03/03/20 for resection and colostomy placement. Her path showed invasive cancer, metastatic to 3/15 LNs. She had a NGT in placed but this was removed on POD 3. Post op her stoma became necrotic and septic. She had another bowel surgery on 03/12/20, which was NED with necrotic tissue.    03/03/2020 Imaging   CT AP 03/03/20  IMPRESSION: 1. Free intraperitoneal air consistent perforation. Most likely site of perforation is in the distal splenic flexure/proximal descending colon where there is a collection of air and gas measuring 6 centimeters. No obvious soft tissue mass identified in this region. At this site, there is abrupt transition of dilated, stool-filled colon to completely decompressed proximal descending colon. 2. Thickened, inflamed loops of jejunum are identified within the pelvis and are likely reactive. 3. Small hiatal hernia. 4. Benign-appearing 1.6 centimeter mass the LEFT hepatic lobe. Recommend comparison with prior studies if available. 5.  Emphysema (ICD10-J43.9). 6.  Aortic Atherosclerosis (ICD10-I70.0). 7. Bilateral renal scarring.   03/03/2020 Surgery   low anterior resection end colostomy by Dr Marcello Moores    03/03/2020 Initial Biopsy   FINAL MICROSCOPIC DIAGNOSIS: 03/03/20 A. RECTOSIGMOID COLON, LOW ANTERIOR RESECTION:  - Invasive colonic adenocarcinoma, 3.5 cm.  - Tumor invades the visceral peritoneum.  - Margins of resection are not involved.  - Metastatic carcinoma in (3) of (15) lymph nodes.  - See oncology table.   B. ADDITIONAL SIGMOID COLON, RESECTION:  - Colonic tissue, negative for carcinoma.  ADDENDUM:  Mismatch Repair Protein (IHC)   SUMMARY INTERPRETATION: NORMAL  There is preserved expression of the major MMR proteins. There is a very  low probability that microsatellite instability (MSI) is present.  However, certain clinically significant MMR protein mutations may result  in preservation of nuclear expression. It is recommended that the   preservation of protein expression be correlated with molecular based  MSI testing.   IHC EXPRESSION RESULTS  TEST           RESULT  MLH1:          Preserved nuclear expression  MSH2:          Preserved nuclear expression  MSH6:          Preserved nuclear expression  PMS2:          Preserved nuclear expression   03/03/2020 Cancer Staging   Staging form: Colon and Rectum, AJCC 8th Edition - Pathologic stage from 03/03/2020: Stage IIIB (pT4a, pN1b, cM0) - Signed by [name], MD on 01/28/2021 Stage prefix: Initial diagnosis Histologic grading system: 4 grade system Histologic grade (G): G2 Residual tumor (R): R0 - None   03/08/2020 Imaging   CT AP 03/08/20 IMPRESSION: 1. Interval midline laparotomy with distal colon resection and diverting left lower quadrant colostomy. 2. Diffuse small bowel dilatation with gas fluid levels most consistent with postoperative ileus. 3. Trace ascites within the abdomen and pelvis. No fluid collection or abscess at this time. Surgical drain within the lower pelvis. 4. Indeterminate 1.4 cm subcapsular liver hypodensity. In light of newly diagnosed rectal cancer, metastatic disease cannot be excluded. PET CT may be useful for further evaluation. 5. Interval development of trace bilateral pleural effusions and diffuse body wall edema.   03/12/2020 Surgery   EXPLORATORY LAPAROTOMY WITH  BOWEL RESECTION AND COLOSTOMY by Dr Hassell Done 03/12/20  FINAL MICROSCOPIC DIAGNOSIS: 03/12/20  A. COLON, SPLENIC FLEXURE, RESECTION:  - Segment of colon (37 cm) with perforation and associated inflammation  - Multiple mucosal ulcers with necrotizing inflammation  - No evidence of malignancy    03/12/2020 Imaging   CT AP WO contrast 03/12/20  IMPRESSION: 1. Exam is limited by lack of intravenous contrast, motion, and artifact from the patient's arms adjacent to the torso. Since 03/08/2020, there has been development of relatively large pockets of intraperitoneal free air.  While intraperitoneal gas would not be unexpected on postoperative day 9, this gas is new since an intervening study of 03/08/2020 and given the relatively large volume certainly raises concern for bowel perforation. No source for the intraperitoneal free air is evident on this study. There is a surgical drain in the pelvis, but there is no free gas around the drain itself to suggest that it represents the source. 2. New circumferential wall thickening in the splenic flexure, descending colon and sigmoid colon leading into the end colostomy. Infection/inflammation would be a consideration. Ischemia cannot be excluded. 3. Relatively small volume intraperitoneal free fluid. High attenuation small fluid collections in the left upper abdomen may reflect hemorrhage, infection or residua from prior perforation. 4. Interval progression of diffuse body wall edema. 5. Residual contrast material in the renal parenchyma from prior imaging, compatible with renal dysfunction.    03/19/2020 Imaging   CT CAP 03/19/20  IMPRESSION: 1. 5.5 x 3.2 cm fluid collection is noted along the greater curvature of the proximal stomach. 2. Surgical drain is again noted in the pelvis with tip in left lower quadrant. 3. Interval development of crescent-shaped fluid collection measuring 16.4 x 3.3 cm in the epigastric region and left upper quadrant of the abdomen which may extend into the left lower quadrant. Potentially this may represent abscess or developing abscess. Multiple other smaller fluid collections are noted which may represent small abscesses. 4. 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 5. Mild amount of free fluid is noted in the posterior pelvis. 6. Moderate anasarca is noted. Aortic Atherosclerosis (ICD10-I70.0).   04/12/2020 Imaging   CT AP 04/12/20  IMPRESSION: 1. Fluid collections within the LEFT abdomen have significantly decreased compared to previous CT exams, now nearly  completely resolved. 2. Percutaneous drainage catheter with tip coiled posterior to the LEFT kidney, stable positioning compared to the previous study. The more anterior catheter has been removed. 3. Small amount of free fluid persists within the abdomen and pelvis. 4. Trace bibasilar pleural effusions. 5. Anasarca. 6. While reviewing today's study, comparing with a chest/abdomen/pelvis CT from earlier same month, there is question of thrombus in the LEFT internal jugular vein. Recommend ultrasound of the LEFT IJ to exclude DVT. This recommendation discussed with patient's hospitalist, Dr. Owens Shark, on 04/12/2020 at 4:20 p.m.   Emphysema (ICD10-J43.9).   04/22/2020 Imaging   CT AP 04/22/20  IMPRESSION: 1. No recurrent intra-abdominal abscess status post interval removal of left retroperitoneal percutaneous drain. Stable small amount of free pelvic fluid. 2. Enlarging dependent bilateral pleural effusions, now moderate in volume. Associated atelectasis at both lung bases. 3. Progressive anasarca with generalized edema throughout the subcutaneous fat. 4. Stable small subcapsular fluid collection along the anterior aspect of the left hepatic lobe. 5. Aortic Atherosclerosis (ICD10-I70.0).   05/23/2020 Initial Diagnosis   Rectal cancer ([place])   01/14/2021 Imaging   CT CAP IMPRESSION: -5.0 cm soft tissue mass along the posterior aspect of the cecum  at the base of the appendix, favored to reflect a peritoneal/serosal implant, corresponding to the patient's known adenocarcinoma. This is new from the prior. -Suspected additional pelvic implants along the uterus and right adnexa, poorly visualized, new/progressive from the prior. -Additional pericapsular lesion along the lateral spleen is mildly progressive, suspicious for additional peritoneal implant. -No evidence of metastatic disease in the chest.   01/27/2021 PET scan   IMPRESSION:  1. Extensive hypermetabolic peritoneal metastasis,  as detailed  above.  2. No evidence of hypermetabolic supradiaphragmatic disease.  3. Diffuse low-level thyroid hypermetabolism can be seen in the  setting of thyroiditis. Consider correlation with thyroid function  labs.  4. Aortic atherosclerosis (ICD10-I70.0) and emphysema (ICD10-J43.9).    02/01/2021 -  Chemotherapy    First-line chemo Xeloda 1500 mg twice daily for day 1-14, every 21 days, started on 02/01/2021. Reduced to 1 week on/ 1 week off and added bevacizumab q2weeks from C2 on 02/24/21.  ----Given good tolerance, I changed to CAPOX and bevacizumab q2weeks from C4 on 03/24/21 with Xeloda 1529m in the AM and 10075min the PM 1 week on/1 week off.     03/24/2021 -  Chemotherapy   Patient is on Treatment Plan : COLORECTAL CapeOx + Bevacizumab q21d     05/08/2021 Imaging   PET  IMPRESSION: 1. There is been interval development of several FDG avid pulmonary nodules within both lungs. Additionally, there is new FDG avid low left paratracheal lymph node. Thoracic metastasis cannot be excluded. 2. Interval improvement in multifocal FDG avid peritoneal metastasis as detailed above.     08/14/2021 Imaging   CT CAP  IMPRESSION: Resolution of previously seen small bilateral pulmonary nodules since previous study.   Stable 8 mm mediastinal lymph node in the AP window.   No evidence of metastatic disease within the abdomen or pelvis.   Nonocclusive pulmonary embolism in bilateral central lower lobe pulmonary arteries, which appears subacute in age.   12/25/2021 Imaging   EXAM: CT CHEST, ABDOMEN, AND PELVIS WITH CONTRAST  IMPRESSION: 1. Minimal, bandlike residua of previously seen FDG avid pulmonary nodules, consistent with treated metastases. No new pulmonary nodules. 2. Unchanged post treatment appearance of the cecal base, with soft tissue thickening about the cecal base and adjacent pelvis, previously FDG avid and in keeping with known appendiceal orifice malignancy. 3.  Unchanged central fluid attenuation lesions about the spleen, left iliopsoas, and left pelvic sidewall, previously FDG avid, consistent with treated peritoneal implants. 4. No evidence of new metastatic disease in the chest, abdomen, or pelvis 5. Status post rectosigmoid colon resection with left lower quadrant end colostomy.   Aortic Atherosclerosis (ICD10-I70.0).   [place] 01/06/2021 Procedure   (surveillance colonoscopy for h/o rectal cancer) Impression:  - Preparation of the colon was fair, lavage performed with mostly adequate views. - Polypoid lesion obliterating appendiceal orifice. Biopsied to evaluate for malignancy. - The examined portion of the ileum was normal. - Two large polyps in the cecum, not removed today pending path at appendiceal orifice, bowel prep. If removed endoscopically in the future would favor EMR to be done at the hospital. - One 5 mm polyp at the ileocecal valve, removed with a cold snare. Resected and retrieved. - One 3 mm polyp in the transverse colon, removed with a cold snare. Resected and retrieved. - Parastomal hernia making initial entry to the distal colon challenging. - The examination was otherwise normal.   01/06/2021 Initial Biopsy   Diagnosis 1. Colon, biopsy, appendiceal  oraface - ADENOCARCINOMA. 2. Colon, polyp(s), ileocecal valve, transverse, x2 - TUBULAR ADENOMA, NEGATIVE FOR HIGH GRADE DYSPLASIA (X1). - COLONIC MUCOSA WITH UNDERLYING LYMPHOID AGGREGATE, NEGATIVE FOR DYSPLASIA (X1).  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 This is new from the prior. -Suspected additional pelvic implants along the uterus and right adnexa, poorly visualized, new/progressive from the prior. -Additional pericapsular lesion along the lateral spleen is mildly progressive, suspicious for additional peritoneal implant. -No evidence of metastatic disease in the chest.   08/14/2021 Imaging   CT CAP IMPRESSION: Resolution of previously seen small bilateral pulmonary nodules since previous study. Stable 8 mm mediastinal lymph node in the AP window. No evidence of metastatic disease within the abdomen or pelvis. Nonocclusive pulmonary embolism in bilateral central lower lobe pulmonary arteries, which appears subacute in age.  Aortic Atherosclerosis (ICD10-I70.0) and Emphysema (ICD10-J43.9).     08/14/2021 Imaging   CT CAP  IMPRESSION: Resolution of previously seen small bilateral pulmonary nodules since previous study.   Stable 8 mm mediastinal lymph node in the AP window.   No evidence of metastatic disease within the abdomen or pelvis.   Nonocclusive pulmonary embolism in bilateral central lower lobe pulmonary arteries, which appears subacute in age.   12/25/2021 Imaging   EXAM: CT CHEST, ABDOMEN, AND PELVIS WITH CONTRAST  IMPRESSION: 1. Minimal, bandlike residua of previously seen FDG avid pulmonary nodules, consistent with treated metastases. No new pulmonary nodules. 2. Unchanged post treatment appearance of the cecal base, with soft tissue thickening about the cecal base and adjacent pelvis, previously FDG avid and in keeping with known appendiceal orifice malignancy. 3. Unchanged central fluid attenuation lesions about the spleen, left iliopsoas, and left pelvic sidewall, previously FDG avid, consistent with treated peritoneal implants. 4. No evidence of new metastatic disease in the chest, abdomen, or pelvis 5. Status post  rectosigmoid colon resection with left lower quadrant end colostomy.   Aortic Atherosclerosis (ICD10-I70.0).      INTERVAL HISTORY:  Tracy [name] is here for a follow up of colon cancer. She was last seen by me on 09/22/22. She presents to the clinic alone. She reports she is doing well today. She reports her breathing is normal with her current regimen.   All other systems were reviewed with the patient and are negative.  MEDICAL HISTORY:  Past Medical History:  Diagnosis Date   Acute deep vein thrombosis (DVT) of brachial vein of right upper extremity (HCC)    Acute on chronic respiratory failure with hypoxia (HCC)    Asthma    Cataract    CHICKENPOX, HX OF 01/06/2011   Qualifier: Diagnosis of  By: Charlett Blake MD, Stacey     Colon cancer [facility])    COPD (chronic obstructive pulmonary disease) ([place]) 2011   FeV1 31% predicted FeV1/FVX 47 %   COVID-19 05/2021   Essential hypertension 05/23/2020   Hemorrhoid    Open right radial fracture 2020   Osteopenia    Perforated sigmoid colon ([place]) 03/03/2020   Pleural effusion    Pneumonia 2021  Postoperative intra-abdominal abscess 2021   Rectal cancer (HCC)    Seasonal allergies    takes Claritin daily prn   Shock circulatory ([place])    Tracheostomy status ([place])    Vitamin D deficiency    takes Vit d every 14 days    SURGICAL HISTORY: Past Surgical History:  Procedure Laterality Date   AUGMENTATION MAMMAPLASTY     saline   EXAMINATION UNDER ANESTHESIA  10/14/2012   Procedure: EXAM UNDER ANESTHESIA;  Surgeon: Gayland Curry, MD,FACS;  Location: [place];  Service: General;  Laterality: N/A;  rectal exam under anesthesia excisional hemorrhoidectomy hemorrhoidal banding x two   EXAMINATION UNDER ANESTHESIA  02/03/2013   excision hemorrhoidal tissue   FOOT SURGERY     left bunionectomy   HEMORRHOIDECTOMY WITH HEMORRHOID BANDING  10/14/2012   Procedure: HEMORRHOIDECTOMY WITH HEMORRHOID BANDING;  Surgeon: Gayland Curry, MD,FACS;   Location: [place];  Service: General;  Laterality: N/A;   IR IMAGING GUIDED PORT INSERTION  02/03/2021   IR THORACENTESIS ASP PLEURAL SPACE W/IMG GUIDE  04/23/2020   LAPAROTOMY N/A 03/03/2020   Procedure: low anterior resection end colostomy;  Surgeon: [name], MD;  Location: [facility];  Service: General;  Laterality: N/A;   LAPAROTOMY N/A 03/12/2020   Procedure: EXPLORATORY LAPAROTOMY WITH BOWEL RESECTION AND COLOSTOMY;  Surgeon: [name], MD;  Location: [facility];  Service: General;  Laterality: N/A;   OPEN REDUCTION INTERNAL FIXATION (ORIF) DISTAL RADIAL FRACTURE Right 11/01/2018   Procedure: OPEN REDUCTION INTERNAL FIXATION (ORIF) DISTAL RADIAL FRACTURE;  Surgeon: [name], MD;  Location: [place];  Service: Orthopedics;  Laterality: Right;   TONSILLECTOMY  1960   recurrent otitis media   US ECHOCARDIOGRAPHY  02/2020    poor windows, normal LV function, severely dilated RV with moderately reduced function, RV volume and pressure overload, mildly dilated RA    I have reviewed the social history and family history with the patient and they are unchanged from previous note.  ALLERGIES:  is allergic to amlodipine and codeine.  MEDICATIONS:  Current Outpatient Medications  Medication Sig Dispense Refill   acetaminophen (TYLENOL) 325 MG tablet Take 2 tablets (650 mg total) by mouth every 6 (six) hours as needed for mild pain. 30 tablet 0   albuterol (PROVENTIL) (2.5 MG/3ML) 0.083% nebulizer solution Take 3 mLs (2.5 mg total) by nebulization every 4 (four) hours as needed for wheezing or shortness of breath. DX: J44.9 360 mL 5   albuterol (VENTOLIN HFA) 108 (90 Base) MCG/ACT inhaler Inhale 2 puffs into the lungs every 6 (six) hours as needed for wheezing or shortness of breath. 8 g 5   ALPRAZolam (XANAX) 0.25 MG tablet Take 1 tablet (0.25 mg total) by mouth at bedtime as needed for anxiety. 30 tablet 0   capecitabine (XELODA) 500 MG tablet Take 3 tablets every 12 hours.  Take for 7 days on, 7 days off, repeat every 14 days. 84 tablet 2   docusate sodium (COLACE) 50 MG capsule Take 50 mg by mouth daily as needed for mild constipation.     fluticasone (FLONASE) 50 MCG/ACT nasal spray Place 2 sprays into both nostrils daily. 16 mL 5   Fluticasone-Umeclidin-Vilant (TRELEGY ELLIPTA) 100-62.5-25 MCG/INH AEPB Inhale 1 puff into the lungs daily. 60 each 5   HYDROcodone bit-homatropine (HYCODAN) 5-1.5 MG/5ML syrup Take 5 mLs by mouth every 6 (six) hours as needed for cough. 240 mL 0   lidocaine-prilocaine (EMLA) cream Apply 1 application. topically as needed. 30 g  2   loratadine (CLARITIN) 10 MG tablet Take 1 tablet (10 mg total) by mouth daily. 90 tablet 3   losartan (COZAAR) 25 MG tablet TAKE 1 TABLET (25 MG TOTAL) BY MOUTH DAILY. 90 tablet 1   ondansetron (ZOFRAN) 8 MG tablet Take 1 tablet (8 mg total) by mouth every 8 (eight) hours as needed for nausea or vomiting. 20 tablet 2   prochlorperazine (COMPAZINE) 10 MG tablet Take 1 tablet (10 mg total) by mouth every 6 (six) hours as needed for nausea or vomiting. 30 tablet 2   rivaroxaban (XARELTO) 20 MG TABS tablet Take 1 tablet (20 mg total) by mouth daily with supper. 90 tablet 1   No current facility-administered medications for this visit.   Facility-Administered Medications Ordered in Other Visits  Medication Dose Route Frequency Provider Last Rate Last Admin   sodium chloride flush (NS) 0.9 % injection 10 mL  10 mL Intracatheter PRN [name], MD   10 mL at 10/12/22 1333    PHYSICAL EXAMINATION: ECOG PERFORMANCE STATUS: 2 - Symptomatic, <50% confined to bed  Vitals:   10/12/22 0912  BP: (!) 152/79  Pulse: 89  Resp: 18  Temp: (!) 97.5 F (36.4 C)  SpO2: 95%   Wt Readings from Last 3 Encounters:  10/12/22 116 lb 6.4 oz (52.8 kg)  09/22/22 112 lb 14.4 oz (51.2 kg)  08/27/22 114 lb 8 oz (51.9 kg)     GENERAL:alert, no distress and comfortable SKIN: skin color normal, no rashes or significant  lesions EYES: normal, Conjunctiva are pink and non-injected, sclera clear  NEURO: alert & oriented x 3 with fluent speech  LABORATORY DATA:  I have reviewed the data as listed    Latest Ref Rng & Units 10/12/2022    8:34 AM 09/22/2022   10:36 AM 08/27/2022    9:39 AM  CBC  WBC 4.0 - 10.5 K/uL 6.9  6.7  8.0   Hemoglobin 12.0 - 15.0 g/dL 12.7  12.2  11.8   Hematocrit 36.0 - 46.0 % 37.9  36.3  34.8   Platelets 150 - 400 K/uL 262  334  224         Latest Ref Rng & Units 10/12/2022    8:34 AM 09/22/2022   10:36 AM 08/27/2022    9:39 AM  CMP  Glucose 70 - 99 mg/dL 114  105  117   BUN 8 - 23 mg/dL _0 Creatinine 0.44 - 1.00 mg/dL 0.91  0.85  0.75   Sodium 135 - 145 mmol/L 137  140  137   Potassium 3.5 - 5.1 mmol/L 3.9  3.6  3.4   Chloride 98 - 111 mmol/L 102  105  103   CO2 22 - 32 mmol/L _1 Calcium 8.9 - 10.3 mg/dL 9.2  9.2  9.3   Total Protein 6.5 - 8.1 g/dL 7.4  7.4  6.7   Total Bilirubin 0.3 - 1.2 mg/dL 0.8  0.5  0.8   Alkaline Phos 38 - 126 U/L 131  124  104   AST 15 - 41 U/L _2 ALT 0 - 44 U/L _3 RADIOGRAPHIC STUDIES: I have personally reviewed the radiological images as listed and agreed with the findings in the report. No results found.    Orders Placed This Encounter  Procedures   CT CHEST ABDOMEN PELVIS W  CONTRAST    Standing Status:   Future    Standing Expiration Date:   10/13/2023    Order Specific Question:   Preferred imaging location?    Answer:   [facility]    Order Specific Question:   Release to patient    Answer:   Immediate    Order Specific Question:   Is Oral Contrast requested for this exam?    Answer:   Yes, Per Radiology protocol   All questions were answered. The patient knows to call the clinic with any problems, questions or concerns. No barriers to learning was detected. The total time spent in the appointment was 30 minutes.     [name], MD 10/12/2022   I, Wilburn Mylar, am  acting as scribe for [name], MD.   I have reviewed the above documentation for accuracy and completeness, and I agree with the above.

## 2022-10-15 ENCOUNTER — Other Ambulatory Visit: Payer: Self-pay

## 2022-10-21 ENCOUNTER — Telehealth: Payer: Self-pay | Admitting: Pulmonary Disease

## 2022-10-21 ENCOUNTER — Telehealth: Payer: Self-pay | Admitting: Acute Care

## 2022-10-21 ENCOUNTER — Ambulatory Visit (INDEPENDENT_AMBULATORY_CARE_PROVIDER_SITE_OTHER): Payer: Medicare HMO

## 2022-10-21 ENCOUNTER — Other Ambulatory Visit: Payer: Self-pay | Admitting: *Deleted

## 2022-10-21 ENCOUNTER — Other Ambulatory Visit: Payer: Self-pay | Admitting: Acute Care

## 2022-10-21 DIAGNOSIS — J449 Chronic obstructive pulmonary disease, unspecified: Secondary | ICD-10-CM | POA: Diagnosis not present

## 2022-10-21 DIAGNOSIS — J069 Acute upper respiratory infection, unspecified: Secondary | ICD-10-CM

## 2022-10-21 DIAGNOSIS — J439 Emphysema, unspecified: Secondary | ICD-10-CM | POA: Diagnosis not present

## 2022-10-21 MED ORDER — PREDNISONE 10 MG PO TABS: ORAL_TABLET | ORAL | 0 refills | Status: AC

## 2022-10-21 MED ORDER — AZITHROMYCIN 250 MG PO TABS: ORAL_TABLET | ORAL | 0 refills | Status: AC

## 2022-10-21 NOTE — Telephone Encounter (Signed)
Routing to SG due to BI being out the rest of the week.

## 2022-10-21 NOTE — Telephone Encounter (Signed)
Called patient and went over the recommendations from [place]. Her and husband both verbalized understanding. I asked them if they could come in before 2pm to get chest xray so we can get it resulted faster for them. They both stated they will be leaving their house in a few minutes. Order is in for chest xray and ordered it STAT.   Please be aware Sarah.  Thank you

## 2022-10-21 NOTE — Telephone Encounter (Signed)
She needs a CXR. We need to make sure this is not fluid. Please have them come in for a CXR and order it stat. Let me know when it has resulted. Please see if they can do this before 2 pm so results can be reviewed and treatment started. Thanks

## 2022-10-21 NOTE — Telephone Encounter (Signed)
Called and spoke with patients husband Tracy [name]. Tracy [name] stated that patient had chemo last week and now she's having SOB and a productive cough. Tracy [name] stated that the cough and SOB has been going on for 5 days and the patient has no energy. Tracy [name] wants to know if an antibiotic can be called in for the patient.   BI, please advise.

## 2022-10-21 NOTE — Telephone Encounter (Signed)
CXR does not show pneumonia. She really needs to be seen. I am not sure what is causing the shortness of breath. I have sent in a z pack to [facility]. I am treating her prophylacticly. She needs to be seen if she gets worse, not better and she needs to seek emergency care.

## 2022-10-21 NOTE — Progress Notes (Signed)
Patient came in for a cxr d/t cough with dark yellow with green mucous.  Tracy Form NP looked at the CXR and said there was no pna.  She sent in a zpack for her and I sent in a prednisone taper as patient felt she was having a flare up of her COPD.  Nothing further needed.

## 2022-10-22 NOTE — Telephone Encounter (Signed)
Patient came in for a cxr d/t cough with dark yellow with green mucous.  Eric Form NP looked at the CXR and said there was no pna.  She sent in a zpack for her and I sent in a prednisone taper as patient felt she was having a flare up of her COPD.  Nothing further needed.

## 2022-10-23 NOTE — Telephone Encounter (Signed)
See other encounter from 11/8. Will close this encounter.

## 2022-10-27 ENCOUNTER — Telehealth: Payer: Self-pay

## 2022-10-27 NOTE — Telephone Encounter (Signed)
Returned call to patient, she stated she wanted to cancel her up coming appointments til January because she was sick. Per patient she has been sick short of breath no energy, coughing since her last treatment. Patient has seen Pulmonologist and was given antibiotics but they are not working will be following back up with Pulmonologist. Patient stated she thinks it may be a COPD flare up.

## 2022-10-29 ENCOUNTER — Telehealth: Payer: Self-pay | Admitting: Pulmonary Disease

## 2022-10-29 NOTE — Progress Notes (Signed)
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 Metastatic colon cancer/ Chemo. Hx RUEDVT, Colostomy,  - Neb albuterol, Ventolin hfa, Trelegy 100,  -----Was given antibiotics 1 week ago. Pt states it has not helped the SOB or coughing. Sent Zpak, pred taper 11/8 for COPD exacerbation. Cycling chemotherapy with the last course about 3 weeks ago.  Over the past week developed cough initially yellow-green without distinct fever.  Started Z-Pak and prednisone taper.  After Z-Pak, still coughing and some dyspnea with less sputum-now clear.  No blood.  No sick contact. CXR 10/21/22- IMPRESSION: Advanced emphysema. Chronic pleural blunting. Tiny amount of fluid in the fissures.  CT chest/abd/pelvis 08/20/22 IMPRESSION: No significant change in poorly defined soft tissue density in the central pelvis along the base of the cecum, consistent with tumor. Stable small low-attenuation lesion along the capsular surface of the left hepatic lobe and nodular peritoneal thickening adjacent to the left iliopsoas muscle, consistent with peritoneal tumor. Stable subcapsular low-attenuation collection along the lateral aspect of the spleen. No new or progressive disease identified. Emphysema (ICD10-J43.9).  ROS-see HPI   + = positive Constitutional:    weight loss, night sweats, fevers, chills, fatigue, lassitude. HEENT:    headaches, difficulty swallowing, tooth/dental problems, sore throat,       sneezing, itching, ear ache, nasal congestion, post nasal drip, snoring CV:    chest pain, orthopnea, PND, swelling in lower extremities, anasarca,                                   dizziness, palpitations Resp:  + shortness of breath with exertion or at rest.                +productive cough,   non-productive cough, coughing up of blood.              +change in color of mucus.  wheezing.   Skin:    rash or lesions. GI:  No-   heartburn, indigestion, abdominal pain, nausea,  vomiting, diarrhea,                 change in bowel habits, loss of appetite GU: dysuria, change in color of urine, no urgency or frequency.   flank pain. MS:   joint pain, stiffness, decreased range of motion, back pain. Neuro-     nothing unusual Psych:  change in mood or affect.  depression or anxiety.   memory loss.  OBJ- Physical Exam General- Alert, Oriented, Affect-appropriate, Distress- none acute Skin- rash-none, lesions- none, excoriation- none Lymphadenopathy- none Head- atraumatic            Eyes- Gross vision intact, PERRLA, conjunctivae and secretions clear            Ears- Hearing, canals-normal            Nose- Clear, no-Septal dev, mucus, polyps, erosion, perforation             Throat- Mallampati II , mucosa- +white mucus/ not thrush , drainage- none, tonsils- atrophic, +hoarse Neck- flexible , trachea midline, no stridor , thyroid nl, carotid no bruit Chest - symmetrical excursion , unlabored           Heart/CV- RRR , no murmur , no gallop  , no rub, nl s1 s2                           -  JVD- none , edema- none, stasis changes- none, varices- none           Lung- clear to P&A, wheeze- none, cough+bronchitic, dullness-none, rub- none           Chest wall-  +R port Abd-  Br/ Gen/ Rectal- Not done, not indicated Extrem- cyanosis- none, clubbing, none, atrophy- none, strength- nl Neuro- grossly intact to observation

## 2022-10-29 NOTE — Telephone Encounter (Signed)
Called and spoke with pt who states she is not any better after recent meds that were prescribed 1 week ago. Stated to pt that she needs to be seen in the office for an appt and she verbalized understanding. Appt scheduled for pt with CY. Nothing further needed.

## 2022-10-30 ENCOUNTER — Encounter: Payer: Self-pay | Admitting: Internal Medicine

## 2022-10-30 ENCOUNTER — Ambulatory Visit: Payer: Medicare HMO | Admitting: Internal Medicine

## 2022-10-30 VITALS — BP 122/84 | HR 89 | Ht 60.0 in | Wt 120.8 lb

## 2022-10-30 DIAGNOSIS — J449 Chronic obstructive pulmonary disease, unspecified: Secondary | ICD-10-CM

## 2022-10-30 DIAGNOSIS — C799 Secondary malignant neoplasm of unspecified site: Secondary | ICD-10-CM

## 2022-10-30 MED ORDER — DOXYCYCLINE HYCLATE 100 MG PO TABS: 100.0000 mg | ORAL_TABLET | Freq: Two times a day (BID) | ORAL | 0 refills | Status: DC

## 2022-10-30 MED ORDER — TRELEGY ELLIPTA 100-62.5-25 MCG/ACT IN AEPB: 1.0000 | INHALATION_SPRAY | Freq: Every day | RESPIRATORY_TRACT | 0 refills | Status: DC

## 2022-10-30 NOTE — Assessment & Plan Note (Addendum)
Bronchitic exacerbation.  Limited initial response to Z-Pak.  I do not think this is pneumonia but she is immunocompromised by chemotherapy. Plan-doxycycline.  Finish current prednisone taper.  Samples of Trelegy 100 until drugstore can restock and provide.  Stay well-hydrated.  Follow-up with Dr. Valeta Harms but let us know sooner as needed.

## 2022-10-30 NOTE — Patient Instructions (Signed)
Script sent for doxycycline antibiotic   Finish the prednisone taper  Please call if we can help

## 2022-10-30 NOTE — Assessment & Plan Note (Signed)
Continue chemotherapy per oncology

## 2022-11-03 ENCOUNTER — Inpatient Hospital Stay: Payer: Medicare HMO

## 2022-11-03 ENCOUNTER — Inpatient Hospital Stay: Payer: Medicare HMO | Admitting: Physician Assistant

## 2022-11-07 ENCOUNTER — Other Ambulatory Visit: Payer: Self-pay

## 2022-11-10 ENCOUNTER — Other Ambulatory Visit: Payer: Self-pay

## 2022-11-12 ENCOUNTER — Other Ambulatory Visit: Payer: Self-pay

## 2022-11-14 ENCOUNTER — Other Ambulatory Visit: Payer: Self-pay

## 2022-11-20 MED FILL — Dexamethasone Sodium Phosphate Inj 100 MG/10ML: INTRAMUSCULAR | Qty: 1 | Status: AC

## 2022-11-20 NOTE — Progress Notes (Unsigned)
[facility] OFF[facility] PROGRESS NOTE  [name], [place] [address] [place] [address]  DIAGNOSIS: f/u of colon cancer   Oncology History Overview Note  Cancer Staging Rectal cancer [facility]) Staging form: Colon and Rectum, AJCC 8th Edition - Pathologic stage from 03/03/2020: Stage IIIB (pT4a, pN1b, cM0) - Signed by [name], MD on 01/28/2021 Stage prefix: Initial diagnosis Histologic grading system: 4 grade system Histologic grade (G): G2 Residual tumor (R): R0 - None    Rectal cancer ([place])  03/03/2020 - 04/05/2020 Hospital Admission   She was admitted to ED on 03/03/20 for abdominal pain and nausea. She had been having diarrhea for several weeks. During hospital stay she developed acute respiratory failure, AKI, RUE DVT from PICC line. Work up showed bowel perforation, small left liver mass and thickening of jejunum. She underwent emergent surgery on 03/03/20 for resection and colostomy placement. Her path showed invasive cancer, metastatic to 3/15 LNs. She had a NGT in placed but this was removed on POD 3. Post op her stoma became necrotic and septic. She had another bowel surgery on 03/12/20, which was NED with necrotic tissue.    03/03/2020 Imaging   CT AP 03/03/20  IMPRESSION: 1. Free intraperitoneal air consistent perforation. Most likely site of perforation is in the distal splenic flexure/proximal descending colon where there is a collection of air and gas measuring 6 centimeters. No obvious soft tissue mass identified in this region. At this site, there is abrupt transition of dilated, stool-filled colon to completely decompressed proximal descending colon. 2. Thickened, inflamed loops of jejunum are identified within the pelvis and are likely reactive. 3. Small hiatal hernia. 4. Benign-appearing 1.6 centimeter mass the LEFT hepatic lobe. Recommend comparison with prior studies if available. 5.  Emphysema (ICD10-J43.9). 6.  Aortic Atherosclerosis (ICD10-I70.0). 7.  Bilateral renal scarring.   03/03/2020 Surgery   low anterior resection end colostomy by Dr Marcello Moores    03/03/2020 Initial Biopsy   FINAL MICROSCOPIC DIAGNOSIS: 03/03/20 A. RECTOSIGMOID COLON, LOW ANTERIOR RESECTION:  - Invasive colonic adenocarcinoma, 3.5 cm.  - Tumor invades the visceral peritoneum.  - Margins of resection are not involved.  - Metastatic carcinoma in (3) of (15) lymph nodes.  - See oncology table.   B. ADDITIONAL SIGMOID COLON, RESECTION:  - Colonic tissue, negative for carcinoma.  ADDENDUM:  Mismatch Repair Protein (IHC)   SUMMARY INTERPRETATION: NORMAL  There is preserved expression of the major MMR proteins. There is a very  low probability that microsatellite instability (MSI) is present.  However, certain clinically significant MMR protein mutations may result  in preservation of nuclear expression. It is recommended that the  preservation of protein expression be correlated with molecular based  MSI testing.   IHC EXPRESSION RESULTS  TEST           RESULT  MLH1:          Preserved nuclear expression  MSH2:          Preserved nuclear expression  MSH6:          Preserved nuclear expression  PMS2:          Preserved nuclear expression   03/03/2020 Cancer Staging   Staging form: Colon and Rectum, AJCC 8th Edition - Pathologic stage from 03/03/2020: Stage IIIB (pT4a, pN1b, cM0) - Signed by [name], MD on 01/28/2021 Stage prefix: Initial diagnosis Histologic grading system: 4 grade system Histologic grade (G): G2 Residual tumor (R): R0 - None   03/08/2020 Imaging  CT AP 03/08/20 IMPRESSION: 1. Interval midline laparotomy with distal colon resection and diverting left lower quadrant colostomy. 2. Diffuse small bowel dilatation with gas fluid levels most consistent with postoperative ileus. 3. Trace ascites within the abdomen and pelvis. No fluid collection or abscess at this time. Surgical drain within the lower pelvis. 4. Indeterminate 1.4 cm  subcapsular liver hypodensity. In light of newly diagnosed rectal cancer, metastatic disease cannot be excluded. PET CT may be useful for further evaluation. 5. Interval development of trace bilateral pleural effusions and diffuse body wall edema.   03/12/2020 Surgery   EXPLORATORY LAPAROTOMY WITH BOWEL RESECTION AND COLOSTOMY by Dr Hassell Done 03/12/20  FINAL MICROSCOPIC DIAGNOSIS: 03/12/20  A. COLON, SPLENIC FLEXURE, RESECTION:  - Segment of colon (37 cm) with perforation and associated inflammation  - Multiple mucosal ulcers with necrotizing inflammation  - No evidence of malignancy    03/12/2020 Imaging   CT AP WO contrast 03/12/20  IMPRESSION: 1. Exam is limited by lack of intravenous contrast, motion, and artifact from the patient's arms adjacent to the torso. Since 03/08/2020, there has been development of relatively large pockets of intraperitoneal free air. While intraperitoneal gas would not be unexpected on postoperative day 9, this gas is new since an intervening study of 03/08/2020 and given the relatively large volume certainly raises concern for bowel perforation. No source for the intraperitoneal free air is evident on this study. There is a surgical drain in the pelvis, but there is no free gas around the drain itself to suggest that it represents the source. 2. New circumferential wall thickening in the splenic flexure, descending colon and sigmoid colon leading into the end colostomy. Infection/inflammation would be a consideration. Ischemia cannot be excluded. 3. Relatively small volume intraperitoneal free fluid. High attenuation small fluid collections in the left upper abdomen may reflect hemorrhage, infection or residua from prior perforation. 4. Interval progression of diffuse body wall edema. 5. Residual contrast material in the renal parenchyma from prior imaging, compatible with renal dysfunction.    03/19/2020 Imaging   CT CAP 03/19/20  IMPRESSION: 1. 5.5 x  3.2 cm fluid collection is noted along the greater curvature of the proximal stomach. 2. Surgical drain is again noted in the pelvis with tip in left lower quadrant. 3. Interval development of crescent-shaped fluid collection measuring 16.4 x 3.3 cm in the epigastric region and left upper quadrant of the abdomen which may extend into the left lower quadrant. Potentially this may represent abscess or developing abscess. Multiple other smaller fluid collections are noted which may represent small abscesses. 4. 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 5. Mild amount of free fluid is noted in the posterior pelvis. 6. Moderate anasarca is noted. Aortic Atherosclerosis (ICD10-I70.0).   04/12/2020 Imaging   CT AP 04/12/20  IMPRESSION: 1. Fluid collections within the LEFT abdomen have significantly decreased compared to previous CT exams, now nearly completely resolved. 2. Percutaneous drainage catheter with tip coiled posterior to the LEFT kidney, stable positioning compared to the previous study. The more anterior catheter has been removed. 3. Small amount of free fluid persists within the abdomen and pelvis. 4. Trace bibasilar pleural effusions. 5. Anasarca. 6. While reviewing today's study, comparing with a chest/abdomen/pelvis CT from earlier same month, there is question of thrombus in the LEFT internal jugular vein. Recommend ultrasound of the LEFT IJ to exclude DVT. This recommendation discussed with patient's hospitalist, Dr. Owens Shark, on 04/12/2020 at 4:20 p.m.   Emphysema (ICD10-J43.9).   04/22/2020 Imaging  CT AP 04/22/20  IMPRESSION: 1. No recurrent intra-abdominal abscess status post interval removal of left retroperitoneal percutaneous drain. Stable small amount of free pelvic fluid. 2. Enlarging dependent bilateral pleural effusions, now moderate in volume. Associated atelectasis at both lung bases. 3. Progressive anasarca with generalized edema throughout  the subcutaneous fat. 4. Stable small subcapsular fluid collection along the anterior aspect of the left hepatic lobe. 5. Aortic Atherosclerosis (ICD10-I70.0).   05/23/2020 Initial Diagnosis   Rectal cancer ([place])   01/14/2021 Imaging   CT CAP IMPRESSION: -5.0 cm soft tissue mass along the posterior aspect of the cecum at the base of the appendix, favored to reflect a peritoneal/serosal implant, corresponding to the patient's known adenocarcinoma. This is new from the prior. -Suspected additional pelvic implants along the uterus and right adnexa, poorly visualized, new/progressive from the prior. -Additional pericapsular lesion along the lateral spleen is mildly progressive, suspicious for additional peritoneal implant. -No evidence of metastatic disease in the chest.   01/27/2021 PET scan   IMPRESSION:  1. Extensive hypermetabolic peritoneal metastasis, as detailed  above.  2. No evidence of hypermetabolic supradiaphragmatic disease.  3. Diffuse low-level thyroid hypermetabolism can be seen in the  setting of thyroiditis. Consider correlation with thyroid function  labs.  4. Aortic atherosclerosis (ICD10-I70.0) and emphysema (ICD10-J43.9).    02/01/2021 -  Chemotherapy    First-line chemo Xeloda 1500 mg twice daily for day 1-14, every 21 days, started on 02/01/2021. Reduced to 1 week on/ 1 week off and added bevacizumab q2weeks from C2 on 02/24/21.  ----Given good tolerance, I changed to CAPOX and bevacizumab q2weeks from C4 on 03/24/21 with Xeloda 1546m in the AM and 10063min the PM 1 week on/1 week off.     03/24/2021 -  Chemotherapy   Patient is on Treatment Plan : COLORECTAL CapeOx + Bevacizumab q21d     05/08/2021 Imaging   PET  IMPRESSION: 1. There is been interval development of several FDG avid pulmonary nodules within both lungs. Additionally, there is new FDG avid low left paratracheal lymph node. Thoracic metastasis cannot be excluded. 2. Interval improvement in  multifocal FDG avid peritoneal metastasis as detailed above.     08/14/2021 Imaging   CT CAP  IMPRESSION: Resolution of previously seen small bilateral pulmonary nodules since previous study.   Stable 8 mm mediastinal lymph node in the AP window.   No evidence of metastatic disease within the abdomen or pelvis.   Nonocclusive pulmonary embolism in bilateral central lower lobe pulmonary arteries, which appears subacute in age.   12/25/2021 Imaging   EXAM: CT CHEST, ABDOMEN, AND PELVIS WITH CONTRAST  IMPRESSION: 1. Minimal, bandlike residua of previously seen FDG avid pulmonary nodules, consistent with treated metastases. No new pulmonary nodules. 2. Unchanged post treatment appearance of the cecal base, with soft tissue thickening about the cecal base and adjacent pelvis, previously FDG avid and in keeping with known appendiceal orifice malignancy. 3. Unchanged central fluid attenuation lesions about the spleen, left iliopsoas, and left pelvic sidewall, previously FDG avid, consistent with treated peritoneal implants. 4. No evidence of new metastatic disease in the chest, abdomen, or pelvis 5. Status post rectosigmoid colon resection with left lower quadrant end colostomy.   Aortic Atherosclerosis (ICD10-I70.0).   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 - Polypoid lesion obliterating appendiceal orifice. Biopsied to evaluate for malignancy. - The  examined portion of the ileum was normal. - Two large polyps in the cecum, not removed today pending path at appendiceal orifice, bowel prep. If removed endoscopically in the future would favor EMR to be done at the hospital. - One 5 mm polyp at the ileocecal valve, removed with a cold snare. Resected and retrieved. - One 3 mm polyp in the transverse colon, removed with a  cold snare. Resected and retrieved. - Parastomal hernia making initial entry to the distal colon challenging. - The examination was otherwise normal.   01/06/2021 Initial Biopsy   Diagnosis 1. Colon, biopsy, appendiceal oraface - ADENOCARCINOMA. 2. Colon, polyp(s), ileocecal valve, transverse, x2 - TUBULAR ADENOMA, NEGATIVE FOR HIGH GRADE DYSPLASIA (X1). - COLONIC MUCOSA WITH UNDERLYING LYMPHOID AGGREGATE, NEGATIVE FOR DYSPLASIA (X1).  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 This is new from the prior. -Suspected additional pelvic implants along the uterus and right adnexa, poorly visualized, new/progressive from the prior. -Additional pericapsular lesion along the lateral spleen is mildly progressive, suspicious for additional peritoneal implant. -No evidence of metastatic disease in the chest.   08/14/2021 Imaging   CT CAP IMPRESSION: Resolution of previously seen small bilateral pulmonary nodules since previous study. Stable 8 mm mediastinal lymph node in the AP window. No evidence of metastatic disease within the abdomen or pelvis. Nonocclusive pulmonary embolism in bilateral central lower lobe pulmonary arteries, which appears subacute in age.  Aortic Atherosclerosis (ICD10-I70.0) and Emphysema (ICD10-J43.9).     08/14/2021 Imaging   CT CAP  IMPRESSION: Resolution of previously seen small bilateral pulmonary nodules since previous study.   Stable 8 mm mediastinal lymph node in the AP window.   No evidence of  metastatic disease within the abdomen or pelvis.   Nonocclusive pulmonary embolism in bilateral central lower lobe pulmonary arteries, which appears subacute in age.   12/25/2021 Imaging   EXAM: CT CHEST, ABDOMEN, AND PELVIS WITH CONTRAST  IMPRESSION: 1. Minimal, bandlike residua of previously seen FDG avid pulmonary nodules, consistent with treated metastases. No new pulmonary nodules. 2. Unchanged post treatment appearance of the cecal base, with soft tissue thickening about the cecal base and adjacent pelvis, previously FDG avid and in keeping with known appendiceal orifice malignancy. 3. Unchanged central fluid attenuation lesions about the spleen, left iliopsoas, and left pelvic sidewall, previously FDG avid, consistent with treated peritoneal implants. 4. No evidence of new metastatic disease in the chest, abdomen, or pelvis 5. Status post rectosigmoid colon resection with left lower quadrant end colostomy.   Aortic Atherosclerosis (ICD10-I70.0).     CURRENT THERAPY: CAPOX -Xeloda started 02/01/21, currently 1500 mg BID 7 days on/7 days off -Bevacizumab q2-3 weeks added with C2 02/24/21             -Beva held 09/02/21 - 12/28/21 d/t PE -Oxaliplatin added with C4 03/24/21, currently q3weeks  INTERVAL HISTORY: VAIDEHI BRADDY 71 y.o. female returns to the clinic today for a follow up visit. The patient was last seen by Dr. Burr Medico on 10/12/22.  The patient missed an appointment due to  an upper respiratory infection lasting about 1 month.  She is followed closely by pulmonary medicine for her COPD and asthma.  She was treated with two rounds of antibiotics, cough medication, and prednisone and she reports feeling nearly 100% back to her normal breathing function. She endorses feeling chronically tired and has chronic rhinorrhea, but this is at her baseline today. The patient denies any other changes in her health.  She denies any fever, chills, night sweats, headaches, or weight. She  maintains a good appetite. She denies any abdominal pain, nausea, vomiting, diarrhea, or constipation.  She has an ostomy bag and she self manages her bowel habits. If she ever has stool too firm, she will take Colace.  She denies any peripheral neuropathy, rashes or skin changes. Dr. Burr Medico had ordered a restaging CT scan to be performed in December 2023.  The patient has only been taking 25 mg of her Losartan every morning and does not check her blood pressure regularly. Per Dr. Ernestina Penna last note, she instructed her to increase to 50 mg daily. She took 25 mg around 7:30 this AM. She is here today for evaluation and repeat blood work.   MEDICAL HISTORY: Past Medical History:  Diagnosis Date   Acute deep vein thrombosis (DVT) of brachial vein of right upper extremity (HCC)    Acute on chronic respiratory failure with hypoxia (HCC)    Asthma    Cataract    CHICKENPOX, HX OF 01/06/2011   Qualifier: Diagnosis of  By: Charlett Blake MD, St[place]     Colon cancer [facility])    COPD (chronic obstructive pulmonary disease) ([place]) 2011   FeV1 31% predicted FeV1/FVX 47 %   COVID-19 05/2021   Essential hypertension 05/23/2020   Hemorrhoid    Open right radial fracture 2020   Osteopenia    Perforated sigmoid colon ([place]) 03/03/2020   Pleural effusion    Pneumonia 2021   Postoperative intra-abdominal abscess 2021   Rectal cancer (HCC)    Seasonal allergies    takes Claritin daily prn   Shock circulatory ([place])    Tracheostomy status ([place])    Vitamin D deficiency    takes Vit d every 14 days    ALLERGIES:  is allergic to amlodipine and codeine.  MEDICATIONS:  Current Outpatient Medications  Medication Sig Dispense Refill   acetaminophen (TYLENOL) 325 MG tablet Take 2 tablets (650 mg total) by mouth every 6 (six) hours as needed for mild pain. 30 tablet 0   albuterol (PROVENTIL) (2.5 MG/3ML) 0.083% nebulizer solution Take 3 mLs (2.5 mg total) by nebulization every 4 (four) hours as needed for wheezing or  shortness of breath. DX: J44.9 360 mL 5   albuterol (VENTOLIN HFA) 108 (90 Base) MCG/ACT inhaler Inhale 2 puffs into the lungs every 6 (six) hours as needed for wheezing or shortness of breath. 8 g 5   ALPRAZolam (XANAX) 0.25 MG tablet Take 1 tablet (0.25 mg total) by mouth at bedtime as needed for anxiety. 30 tablet 0   capecitabine (XELODA) 500 MG tablet Take 3 tablets every 12 hours. Take for 7 days on, 7 days off, repeat every 14 days. 84 tablet 2   docusate sodium (COLACE) 50 MG capsule Take 50 mg by mouth daily as needed for mild constipation.     fluticasone (FLONASE) 50 MCG/ACT nasal spray Place 2 sprays into both nostrils daily. 16 mL 5   Fluticasone-Umeclidin-Vilant (TRELEGY ELLIPTA) 100-62.5-25 MCG/ACT AEPB Inhale 1 puff into the lungs daily. 2 each 0  lidocaine-prilocaine (EMLA) cream Apply 1 application. topically as needed. 30 g 2   loratadine (CLARITIN) 10 MG tablet Take 1 tablet (10 mg total) by mouth daily. 90 tablet 3   losartan (COZAAR) 25 MG tablet TAKE 1 TABLET (25 MG TOTAL) BY MOUTH DAILY. 90 tablet 1   ondansetron (ZOFRAN) 8 MG tablet Take 1 tablet (8 mg total) by mouth every 8 (eight) hours as needed for nausea or vomiting. 20 tablet 2   prochlorperazine (COMPAZINE) 10 MG tablet Take 1 tablet (10 mg total) by mouth every 6 (six) hours as needed for nausea or vomiting. 30 tablet 2   rivaroxaban (XARELTO) 20 MG TABS tablet Take 1 tablet (20 mg total) by mouth daily with supper. 90 tablet 1   HYDROcodone bit-homatropine (HYCODAN) 5-1.5 MG/5ML syrup Take 5 mLs by mouth every 6 (six) hours as needed for cough. (Patient not taking: Reported on 11/23/2022) 240 mL 0   No current facility-administered medications for this visit.   Facility-Administered Medications Ordered in Other Visits  Medication Dose Route Frequency Provider Last Rate Last Admin   bevacizumab-bvzr (ZIRABEV) 400 mg in sodium chloride 0.9 % 100 mL chemo infusion  7.5 mg/kg (Treatment Plan Recorded) Intravenous  Once [name], MD       dexamethasone (DECADRON) 10 mg in sodium chloride 0.9 % 50 mL IVPB  10 mg Intravenous Once [name], MD       dextrose 5 % solution   Intravenous Once [name], MD       heparin lock flush 100 unit/mL  500 Units Intracatheter Once PRN [name], MD       oxaliplatin (ELOXATIN) 75 mg in dextrose 5 % 500 mL chemo infusion  50 mg/m2 (Treatment Plan Recorded) Intravenous Once [name], MD       palonosetron (ALOXI) injection 0.25 mg  0.25 mg Intravenous Once [name], MD       sodium chloride flush (NS) 0.9 % injection 10 mL  10 mL Intracatheter PRN [name], MD        SURGICAL HISTORY:  Past Surgical History:  Procedure Laterality Date   AUGMENTATION MAMMAPLASTY     saline   EXAMINATION UNDER ANESTHESIA  10/14/2012   Procedure: EXAM UNDER ANESTHESIA;  Surgeon: Gayland Curry, MD,FACS;  Location: [facility];  Service: General;  Laterality: N/A;  rectal exam under anesthesia excisional hemorrhoidectomy hemorrhoidal banding x two   EXAMINATION UNDER ANESTHESIA  02/03/2013   excision hemorrhoidal tissue   FOOT SURGERY     left bunionectomy   HEMORRHOIDECTOMY WITH HEMORRHOID BANDING  10/14/2012   Procedure: HEMORRHOIDECTOMY WITH HEMORRHOID BANDING;  Surgeon: Gayland Curry, MD,FACS;  Location: [facility];  Service: General;  Laterality: N/A;   IR IMAGING GUIDED PORT INSERTION  02/03/2021   IR THORACENTESIS ASP PLEURAL SPACE W/IMG GUIDE  04/23/2020   LAPAROTOMY N/A 03/03/2020   Procedure: low anterior resection end colostomy;  Surgeon: [name], MD;  Location: [facility];  Service: General;  Laterality: N/A;   LAPAROTOMY N/A 03/12/2020   Procedure: EXPLORATORY LAPAROTOMY WITH BOWEL RESECTION AND COLOSTOMY;  Surgeon: [name], MD;  Location: [facility];  Service: General;  Laterality: N/A;   OPEN REDUCTION INTERNAL FIXATION (ORIF) DISTAL RADIAL FRACTURE Right 11/01/2018   Procedure: OPEN REDUCTION INTERNAL FIXATION (ORIF) DISTAL RADIAL FRACTURE;  Surgeon: [name], MD;   Location: [facility];  Service: Orthopedics;  Laterality: Right;   TONSILLECTOMY  1960   recurrent otitis media   US ECHOCARDIOGRAPHY  02/2020  poor windows, normal LV function, severely dilated RV with moderately reduced function, RV volume and pressure overload, mildly dilated RA    REVIEW OF SYSTEMS:   Review of Systems  Constitutional: Positive for stable fatigue. Negative for appetite change, chills, fever and unexpected weight change.  HENT: Negative for mouth sores, nosebleeds, sore throat and trouble swallowing.   Eyes: Negative for eye problems and icterus.  Respiratory: Positive for shortness of breath at her baseline. Negative for hemoptysis and wheezing.   Cardiovascular: Negative for chest pain and leg swelling.  Gastrointestinal: Negative for abdominal pain, constipation, diarrhea, nausea and vomiting.  Genitourinary: Negative for bladder incontinence, difficulty urinating, dysuria, frequency and hematuria.   Musculoskeletal: Negative for back pain, gait problem, neck pain and neck stiffness.  Skin: Negative for itching and rash.  Neurological: Negative for dizziness, extremity weakness, gait problem, headaches, light-headedness and seizures.  Hematological: Negative for adenopathy. Does not bruise/bleed easily.  Psychiatric/Behavioral: Negative for confusion, depression and sleep disturbance. The patient is not nervous/anxious.     PHYSICAL EXAMINATION:  Blood pressure (!) 164/84, pulse 77, temperature 97.7 F (36.5 C), temperature source Temporal, resp. rate 15, weight 115 lb 1.6 oz (52.2 kg), last menstrual period 12/14/2000, SpO2 100 %.  ECOG PERFORMANCE STATUS: 1  Physical Exam  Constitutional: Oriented to person, place, and time and well-developed, well-nourished, and in no distress.  HENT:  Head: Normocephalic and atraumatic.  Mouth/Throat: Oropharynx is clear and moist. No oropharyngeal exudate.  Eyes: Conjunctivae are normal. Right eye  exhibits no discharge. Left eye exhibits no discharge. No scleral icterus.  Neck: Normal range of motion. Neck supple.  Cardiovascular: Normal rate, regular rhythm, normal heart sounds and intact distal pulses.   Pulmonary/Chest: Effort normal and breath sounds normal. No respiratory distress. No wheezes. No rales.  Abdominal: Soft. Bowel sounds are normal. Exhibits no distension and no mass. There is no tenderness.  Musculoskeletal: Normal range of motion. Exhibits no edema.  Lymphadenopathy:    No cervical adenopathy.  Neurological: Alert and oriented to person, place, and time. Exhibits normal muscle tone. Gait normal. Coordination normal.  Skin: Skin is warm and dry. No rash noted. Not diaphoretic. No erythema. No pallor.  Psychiatric: Mood, memory and judgment normal.  Vitals reviewed.  LABORATORY DATA: Lab Results  Component Value Date   WBC 5.3 11/23/2022   HGB 12.8 11/23/2022   HCT 38.7 11/23/2022   MCV 104.3 (H) 11/23/2022   PLT 285 11/23/2022      Chemistry      Component Value Date/Time   NA 139 11/23/2022 0939   K 3.7 11/23/2022 0939   CL 106 11/23/2022 0939   CO2 25 11/23/2022 0939   BUN 20 11/23/2022 0939   CREATININE 0.78 11/23/2022 0939      Component Value Date/Time   CALCIUM 9.3 11/23/2022 0939   ALKPHOS 109 11/23/2022 0939   AST 16 11/23/2022 0939   ALT 8 11/23/2022 0939   BILITOT 0.5 11/23/2022 0939       RADIOGRAPHIC STUDIES:  No results found.   ASSESSMENT/PLAN:  Tracy [name] is a 71 y.o. female with    1. [name], MMR normal, peritoneal metastasis in 01/2021 -diagnosed 02/2020, s/p emergent surgery for perforation. Path showed invasive adenocarcinoma with perforation, +3/15 LNs, and clear margins. Initial CT scan negative for distant metastasis. She was not a candidate for adjuvant chemo due to very slow recovery from surgery -Guardant Reveal ctDNA were negative (06/06/20, 07/12/20, and  09/10/20). -surveillance  colonoscopy by Dr. Havery Moros 01/06/21 showed new adenocarcinoma at appendiceal orifice. Biopsy confirmed adenocarcinoma -01/27/21 PET showed tumor in appendiceal orifice is likely peritoneal metastasis growing into the cecum. Her peritoneal implants are mainly in the pelvis, difficult to biopsy -She began Xeloda on 02/01/21. Cycle 2 was changed to 1 week on/1 week off, and beva was added. She tolerates this well -PS improved and low dose oxaliplatin was added with C4, now on Capeox/beva q3 weeks, with continuing Xeloda 1 week on/1 week off. -Beva was held for PE found on CT 08/2021, she is on xarelto. Resumed beva q3 weeks 12/29/21 -she twisted her ankle on 07/22/22, Oxaliplatin and beva were held and restarted on 9/14. She continued Xeloda as prescribed through recovery.  -restaging CT CAP on 08/20/22 was stable, with no new or progressive findings. Plan to repeat in this month. Dr. Burr Medico already placed the order. It has not been scheduled at this time. I gave the patient the number to radiology scheduling to get this scheduled.  -she is clinically stable. Labs reviewed, overall stable, adequate to proceed with beva and oxali today. -Her BP was manually rechecked at 162/84 today. Reviewed with Dr. Burr Medico, ok to proceed with treatment as scheduled. The patient was advised to monitor her BP closely at home.    2. H/o Bilateral PE -incidental finding on CT 08/14/21 -no hypoxia, stable respiratory status. She began xarelto 08/15/21 -Beva was held through 12/28/21   3. Comorbidities: Asthma, COPD, anxiety, HTN -on trelegy, Claritin, flonase, and albuterol for her COPD/asthma per Dr. Valeta Harms. Uses albuterol nebulizer for exacerbation. -h/o Covid and URI 05/2021 -f/u pulmonology and PCP.  -In the interval since last being seen, she developed respiratory infection. Her pulmonologist gave her prednisone, two antibiotics, and cough medications. Her symptoms are resolved at this time. She states she  feels "almost 100%" better at this time -At her last appointment on 10/12/22, Dr. Burr Medico instructed her to increase her losartan to 50 mg p.o. daily. The patient was not aware and had been taking 25 mg. Her BP was elevated at 164/84. Ok to proceed with treatment, but the patient was advised to monitor her BP closely at home and take 50 mg of losartan. Of course, if this make her BP too low, then she was advised to go back to 25 mg daily.      PLAN: -proceed with Beva and oxali today -Monitor BP at home and take 50 mg losartan as prescribed.  -continue Xeloda -lab, flush, f/u, and oxali/beva in 3 and 6 weeks             -she prefers Monday appointments -CT to be done around 12/25/22, Patient was given the number to radiology scheduling        No orders of the defined types were placed in this encounter.    The total time spent in the appointment was 20-29 minutes.   Nakyla Bracco L Chantrell Apsey, PA-C 11/23/22

## 2022-11-23 ENCOUNTER — Ambulatory Visit: Payer: Medicare HMO

## 2022-11-23 ENCOUNTER — Inpatient Hospital Stay (HOSPITAL_BASED_OUTPATIENT_CLINIC_OR_DEPARTMENT_OTHER): Payer: Medicare HMO

## 2022-11-23 ENCOUNTER — Inpatient Hospital Stay: Payer: Medicare HMO | Attending: Hematology

## 2022-11-23 ENCOUNTER — Inpatient Hospital Stay: Payer: Medicare HMO | Admitting: Physician Assistant

## 2022-11-23 ENCOUNTER — Other Ambulatory Visit: Payer: Self-pay

## 2022-11-23 DIAGNOSIS — C799 Secondary malignant neoplasm of unspecified site: Secondary | ICD-10-CM

## 2022-11-23 DIAGNOSIS — I10 Essential (primary) hypertension: Secondary | ICD-10-CM | POA: Insufficient documentation

## 2022-11-23 DIAGNOSIS — Z86711 Personal history of pulmonary embolism: Secondary | ICD-10-CM | POA: Insufficient documentation

## 2022-11-23 DIAGNOSIS — Z5112 Encounter for antineoplastic immunotherapy: Secondary | ICD-10-CM | POA: Diagnosis not present

## 2022-11-23 DIAGNOSIS — Z7952 Long term (current) use of systemic steroids: Secondary | ICD-10-CM | POA: Diagnosis not present

## 2022-11-23 DIAGNOSIS — J4489 Other specified chronic obstructive pulmonary disease: Secondary | ICD-10-CM | POA: Insufficient documentation

## 2022-11-23 DIAGNOSIS — C20 Malignant neoplasm of rectum: Secondary | ICD-10-CM | POA: Diagnosis not present

## 2022-11-23 DIAGNOSIS — R69 Illness, unspecified: Secondary | ICD-10-CM | POA: Diagnosis not present

## 2022-11-23 DIAGNOSIS — Z79899 Other long term (current) drug therapy: Secondary | ICD-10-CM | POA: Diagnosis not present

## 2022-11-23 DIAGNOSIS — Z5111 Encounter for antineoplastic chemotherapy: Secondary | ICD-10-CM | POA: Insufficient documentation

## 2022-11-23 DIAGNOSIS — Z933 Colostomy status: Secondary | ICD-10-CM | POA: Insufficient documentation

## 2022-11-23 DIAGNOSIS — F419 Anxiety disorder, unspecified: Secondary | ICD-10-CM | POA: Diagnosis not present

## 2022-11-23 DIAGNOSIS — Z7901 Long term (current) use of anticoagulants: Secondary | ICD-10-CM | POA: Diagnosis not present

## 2022-11-23 DIAGNOSIS — C786 Secondary malignant neoplasm of retroperitoneum and peritoneum: Secondary | ICD-10-CM | POA: Insufficient documentation

## 2022-11-23 LAB — CBC WITH DIFFERENTIAL (CANCER CENTER ONLY)
Abs Immature Granulocytes: 0.02 10*3/uL (ref 0.00–0.07)
Basophils Absolute: 0 10*3/uL (ref 0.0–0.1)
Basophils Relative: 1 %
Eosinophils Absolute: 0.2 10*3/uL (ref 0.0–0.5)
Eosinophils Relative: 3 %
HCT: 38.7 % (ref 36.0–46.0)
Hemoglobin: 12.8 g/dL (ref 12.0–15.0)
Immature Granulocytes: 0 %
Lymphocytes Relative: 25 %
Lymphs Abs: 1.4 10*3/uL (ref 0.7–4.0)
MCH: 34.5 pg — ABNORMAL HIGH (ref 26.0–34.0)
MCHC: 33.1 g/dL (ref 30.0–36.0)
MCV: 104.3 fL — ABNORMAL HIGH (ref 80.0–100.0)
Monocytes Absolute: 0.5 10*3/uL (ref 0.1–1.0)
Monocytes Relative: 10 %
Neutro Abs: 3.3 10*3/uL (ref 1.7–7.7)
Neutrophils Relative %: 61 %
Platelet Count: 285 10*3/uL (ref 150–400)
RBC: 3.71 MIL/uL — ABNORMAL LOW (ref 3.87–5.11)
RDW: 14.4 % (ref 11.5–15.5)
WBC Count: 5.3 10*3/uL (ref 4.0–10.5)
nRBC: 0 % (ref 0.0–0.2)

## 2022-11-23 LAB — CMP (CANCER CENTER ONLY)
ALT: 8 U/L (ref 0–44)
AST: 16 U/L (ref 15–41)
Albumin: 3.9 g/dL (ref 3.5–5.0)
Alkaline Phosphatase: 109 U/L (ref 38–126)
Anion gap: 8 (ref 5–15)
BUN: 20 mg/dL (ref 8–23)
CO2: 25 mmol/L (ref 22–32)
Calcium: 9.3 mg/dL (ref 8.9–10.3)
Chloride: 106 mmol/L (ref 98–111)
Creatinine: 0.78 mg/dL (ref 0.44–1.00)
GFR, Estimated: >60 mL/min (ref >60)
Glucose, Bld: 97 mg/dL (ref 70–99)
Potassium: 3.7 mmol/L (ref 3.5–5.1)
Sodium: 139 mmol/L (ref 135–145)
Total Bilirubin: 0.5 mg/dL (ref 0.3–1.2)
Total Protein: 6.7 g/dL (ref 6.5–8.1)

## 2022-11-23 LAB — TOTAL PROTEIN, URINE DIPSTICK: Protein, ur: 30 mg/dL — AB

## 2022-11-23 MED ORDER — PALONOSETRON HCL INJECTION 0.25 MG/5ML
0.2500 mg | Freq: Once | INTRAVENOUS | Status: AC
  Administered 2022-11-23: 0.25 mg via INTRAVENOUS
  Filled 2022-11-23: qty 5

## 2022-11-23 MED ORDER — SODIUM CHLORIDE 0.9 % IV SOLN: Freq: Once | INTRAVENOUS | Status: AC

## 2022-11-23 MED ORDER — ALTEPLASE 2 MG IJ SOLR
2.0000 mg | Freq: Once | INTRAMUSCULAR | Status: DC
  Filled 2022-11-23: qty 2

## 2022-11-23 MED ORDER — SODIUM CHLORIDE 0.9 % IV SOLN
10.0000 mg | Freq: Once | INTRAVENOUS | Status: AC
  Administered 2022-11-23: 10 mg via INTRAVENOUS
  Filled 2022-11-23: qty 10

## 2022-11-23 MED ORDER — DEXTROSE 5 % IV SOLN: Freq: Once | INTRAVENOUS | Status: AC

## 2022-11-23 MED ORDER — SODIUM CHLORIDE 0.9% FLUSH
10.0000 mL | INTRAVENOUS | Status: DC | PRN
  Administered 2022-11-23: 10 mL

## 2022-11-23 MED ORDER — SODIUM CHLORIDE 0.9 % IV SOLN
7.5000 mg/kg | Freq: Once | INTRAVENOUS | Status: AC
  Administered 2022-11-23: 400 mg via INTRAVENOUS
  Filled 2022-11-23: qty 16

## 2022-11-23 MED ORDER — OXALIPLATIN CHEMO INJECTION 100 MG/20ML
50.0000 mg/m2 | Freq: Once | INTRAVENOUS | Status: AC
  Administered 2022-11-23: 75 mg via INTRAVENOUS
  Filled 2022-11-23: qty 15

## 2022-11-23 MED ORDER — SODIUM CHLORIDE 0.9% FLUSH
10.0000 mL | Freq: Once | INTRAVENOUS | Status: AC
  Administered 2022-11-23: 10 mL

## 2022-11-23 MED ORDER — HEPARIN SOD (PORK) LOCK FLUSH 100 UNIT/ML IV SOLN
500.0000 [IU] | Freq: Once | INTRAVENOUS | Status: AC | PRN
  Administered 2022-11-23: 500 [IU]

## 2022-11-23 NOTE — Patient Instructions (Signed)
[facility]  Discharge Instructions: Thank you for choosing [facility] to provide your oncology and hematology care.   If you have a lab appointment with the [facility], please go directly to the [facility] and check in at the registration area.   Wear comfortable clothing and clothing appropriate for easy access to any Portacath or PICC line.   We strive to give you quality time with your provider. You may need to reschedule your appointment if you arrive late (15 or more minutes).  Arriving late affects you and other patients whose appointments are after yours.  Also, if you miss three or more appointments without notifying the office, you may be dismissed from the clinic at the provider's discretion.      For prescription refill requests, have your pharmacy contact our office and allow 72 hours for refills to be completed.    Today you received the following chemotherapy and/or immunotherapy agents: bevacizumab/oxaliplatin      To help prevent nausea and vomiting after your treatment, we encourage you to take your nausea medication as directed.  BELOW ARE SYMPTOMS THAT SHOULD BE REPORTED IMMEDIATELY: *FEVER GREATER THAN 100.4 F (38 C) OR HIGHER *CHILLS OR SWEATING *NAUSEA AND VOMITING THAT IS NOT CONTROLLED WITH YOUR NAUSEA MEDICATION *UNUSUAL SHORTNESS OF BREATH *UNUSUAL BRUISING OR BLEEDING *URINARY PROBLEMS (pain or burning when urinating, or frequent urination) *BOWEL PROBLEMS (unusual diarrhea, constipation, pain near the anus) TENDERNESS IN MOUTH AND THROAT WITH OR WITHOUT PRESENCE OF ULCERS (sore throat, sores in mouth, or a toothache) UNUSUAL RASH, SWELLING OR PAIN  UNUSUAL VAGINAL DISCHARGE OR ITCHING   Items with * indicate a potential emergency and should be followed up as soon as possible or go to the Emergency Department if any problems should occur.  Please show the CHEMOTHERAPY ALERT CARD or IMMUNOTHERAPY ALERT CARD  at check-in to the Emergency Department and triage nurse.  Should you have questions after your visit or need to cancel or reschedule your appointment, please contact [facility]  Dept: [phone]  and follow the prompts.  Office hours are 8:00 a.m. to 4:30 p.m. Monday - Friday. Please note that voicemails left after 4:00 p.m. may not be returned until the following business day.  We are closed weekends and major holidays. You have access to a nurse at all times for urgent questions. Please call the main number to the clinic Dept: [phone] and follow the prompts.   For any non-urgent questions, you may also contact your provider using MyChart. We now offer e-Visits for anyone 75 and older to request care online for non-urgent symptoms. For details visit [email or web address].   Also download the MyChart app! Go to the app store, search "MyChart", open the app, select [name], and log in with your MyChart username and password.  Masks are optional in the cancer centers. If you would like for your care team to wear a mask while they are taking care of you, please let them know. You may have one support Tracy [name] who is at least 69 years old accompany you for your appointments.

## 2022-11-23 NOTE — Progress Notes (Signed)
Per Mertens, PA, & Dr. Burr Medico okay to treat with elevated blood pressure

## 2022-11-24 ENCOUNTER — Other Ambulatory Visit: Payer: Self-pay

## 2022-12-03 ENCOUNTER — Other Ambulatory Visit: Payer: Self-pay

## 2022-12-04 ENCOUNTER — Other Ambulatory Visit: Payer: Self-pay

## 2022-12-11 ENCOUNTER — Other Ambulatory Visit: Payer: Self-pay | Admitting: Pulmonary Disease

## 2022-12-16 ENCOUNTER — Telehealth: Payer: Self-pay

## 2022-12-16 NOTE — Telephone Encounter (Signed)
Pt called on-call service on 12/13/2022 stating that she tested (+) for Covid on 12/13/2022.  Pt stated her symptoms started on 12/11/2022 evening.  Pt's last chemo tx was on 11/23/2022 CAPEOX + Bev.    Called pt to speak with her regarding her Covid dx.  Pt stated she's feel better but is still having low grade fevers.  Pt stated she's taking Claritin D and Mucinex for her symptoms.  Recommended Robitussin DM for her cough.  Pt stated she will start taking Robitussin for her cough.  Pt stated she's still taking Capecitabine which is making her nauseous.  Informed pt that this RN will speak with Dr. Burr Medico about the Capecitabine because she may want the pt to hold the Capecitabine for now since she has Covid.  Spoke with Dr. Burr Medico and she instructed the pt to stop Capecitabine for now.  Notified pt of Dr. Ernestina Penna instructions.  Contacted Radiology regarding pt's upcoming CT Scan appt on 12/22/2022 to find out if the CT Scan will need to be rescheduled since the pt has tested positive for Covid.  Per Margit Hanks, pt can get CT Scan x5 days post dx asymptomatic.  If pt is symptomatic pt will have to wait 10 days post covid dx.

## 2022-12-17 ENCOUNTER — Other Ambulatory Visit: Payer: Self-pay

## 2022-12-22 ENCOUNTER — Ambulatory Visit (HOSPITAL_COMMUNITY)
Admission: RE | Admit: 2022-12-22 | Discharge: 2022-12-22 | Disposition: A | Discharge status: Home / Self Care | Payer: Medicare HMO | Source: Ambulatory Visit | Attending: Hematology | Admitting: Hematology

## 2022-12-22 DIAGNOSIS — C20 Malignant neoplasm of rectum: Secondary | ICD-10-CM | POA: Insufficient documentation

## 2022-12-22 DIAGNOSIS — K769 Liver disease, unspecified: Secondary | ICD-10-CM | POA: Diagnosis not present

## 2022-12-22 DIAGNOSIS — R918 Other nonspecific abnormal finding of lung field: Secondary | ICD-10-CM | POA: Diagnosis not present

## 2022-12-22 DIAGNOSIS — M6289 Other specified disorders of muscle: Secondary | ICD-10-CM | POA: Diagnosis not present

## 2022-12-22 DIAGNOSIS — R188 Other ascites: Secondary | ICD-10-CM | POA: Diagnosis not present

## 2022-12-22 DIAGNOSIS — C189 Malignant neoplasm of colon, unspecified: Secondary | ICD-10-CM | POA: Diagnosis not present

## 2022-12-22 MED ORDER — IOHEXOL 300 MG/ML  SOLN
100.0000 mL | Freq: Once | INTRAMUSCULAR | Status: AC | PRN
  Administered 2022-12-22: 100 mL via INTRAVENOUS

## 2022-12-22 MED ORDER — IOHEXOL 9 MG/ML PO SOLN
ORAL | Status: AC
  Filled 2022-12-22: qty 1000

## 2022-12-22 MED ORDER — SODIUM CHLORIDE (PF) 0.9 % IJ SOLN
INTRAMUSCULAR | Status: AC
  Filled 2022-12-22: qty 50

## 2022-12-24 ENCOUNTER — Inpatient Hospital Stay: Payer: Medicare HMO | Attending: Hematology | Admitting: Nurse Practitioner

## 2022-12-24 ENCOUNTER — Encounter: Payer: Self-pay | Admitting: Nurse Practitioner

## 2022-12-24 DIAGNOSIS — Z79899 Other long term (current) drug therapy: Secondary | ICD-10-CM | POA: Insufficient documentation

## 2022-12-24 DIAGNOSIS — Z933 Colostomy status: Secondary | ICD-10-CM | POA: Insufficient documentation

## 2022-12-24 DIAGNOSIS — C799 Secondary malignant neoplasm of unspecified site: Secondary | ICD-10-CM | POA: Diagnosis not present

## 2022-12-24 DIAGNOSIS — Z5112 Encounter for antineoplastic immunotherapy: Secondary | ICD-10-CM | POA: Insufficient documentation

## 2022-12-24 DIAGNOSIS — G629 Polyneuropathy, unspecified: Secondary | ICD-10-CM | POA: Insufficient documentation

## 2022-12-24 DIAGNOSIS — C181 Malignant neoplasm of appendix: Secondary | ICD-10-CM

## 2022-12-24 DIAGNOSIS — Z7901 Long term (current) use of anticoagulants: Secondary | ICD-10-CM | POA: Insufficient documentation

## 2022-12-24 DIAGNOSIS — C19 Malignant neoplasm of rectosigmoid junction: Secondary | ICD-10-CM

## 2022-12-24 DIAGNOSIS — Z5111 Encounter for antineoplastic chemotherapy: Secondary | ICD-10-CM | POA: Insufficient documentation

## 2022-12-24 DIAGNOSIS — Z7952 Long term (current) use of systemic steroids: Secondary | ICD-10-CM | POA: Insufficient documentation

## 2022-12-24 DIAGNOSIS — J4489 Other specified chronic obstructive pulmonary disease: Secondary | ICD-10-CM | POA: Insufficient documentation

## 2022-12-24 DIAGNOSIS — C786 Secondary malignant neoplasm of retroperitoneum and peritoneum: Secondary | ICD-10-CM | POA: Insufficient documentation

## 2022-12-24 DIAGNOSIS — Z86718 Personal history of other venous thrombosis and embolism: Secondary | ICD-10-CM | POA: Insufficient documentation

## 2022-12-24 DIAGNOSIS — F419 Anxiety disorder, unspecified: Secondary | ICD-10-CM | POA: Insufficient documentation

## 2022-12-24 DIAGNOSIS — C20 Malignant neoplasm of rectum: Secondary | ICD-10-CM

## 2022-12-24 DIAGNOSIS — Z86711 Personal history of pulmonary embolism: Secondary | ICD-10-CM | POA: Insufficient documentation

## 2022-12-24 MED ORDER — CAPECITABINE 500 MG PO TABS: ORAL_TABLET | ORAL | 2 refills | Status: DC

## 2022-12-24 NOTE — Progress Notes (Signed)
Patient Care Team: [name], DO as PCP - General (Family Medicine) [name], MD as Consulting Physician (Hematology) Icard, Octavio Graves, DO as Consulting Physician (Pulmonary Disease) Aleneva, P.A. [name], MD as Consulting Physician (General Surgery) [name], MD as Consulting Physician (General Surgery) [name], [name], MD as Consulting Physician (Gastroenterology)   I connected with Gurney Maxin on 12/24/22 at 12:30 PM EST by telephone visit and verified that I am speaking with the correct person using two identifiers.   I discussed the limitations, risks, security and privacy concerns of performing an evaluation and management service by telemedicine and the availability of in-person appointments. I also discussed with the patient that there may be a patient responsible charge related to this service. The patient expressed understanding and agreed to proceed.   Other persons participating in the visit and their role in the encounter: Spouse, Dobbins   Patient's location: Home  Provider's location: [place] office   CHIEF COMPLAINT: Follow up colorectal cancer, CT results   Oncology History Overview Note  Cancer Staging Rectal cancer [facility]) Staging form: Colon and Rectum, AJCC 8th Edition - Pathologic stage from 03/03/2020: Stage IIIB (pT4a, pN1b, cM0) - Signed by [name], MD on 01/28/2021 Stage prefix: Initial diagnosis Histologic grading system: 4 grade system Histologic grade (G): G2 Residual tumor (R): R0 - None    Rectal cancer ([place])  03/03/2020 - 04/05/2020 Hospital Admission   She was admitted to ED on 03/03/20 for abdominal pain and nausea. She had been having diarrhea for several weeks. During hospital stay she developed acute respiratory failure, AKI, RUE DVT from PICC line. Work up showed bowel perforation, small left liver mass and thickening of jejunum. She underwent emergent surgery on 03/03/20 for resection and colostomy  placement. Her path showed invasive cancer, metastatic to 3/15 LNs. She had a NGT in placed but this was removed on POD 3. Post op her stoma became necrotic and septic. She had another bowel surgery on 03/12/20, which was NED with necrotic tissue.    03/03/2020 Imaging   CT AP 03/03/20  IMPRESSION: 1. Free intraperitoneal air consistent perforation. Most likely site of perforation is in the distal splenic flexure/proximal descending colon where there is a collection of air and gas measuring 6 centimeters. No obvious soft tissue mass identified in this region. At this site, there is abrupt transition of dilated, stool-filled colon to completely decompressed proximal descending colon. 2. Thickened, inflamed loops of jejunum are identified within the pelvis and are likely reactive. 3. Small hiatal hernia. 4. Benign-appearing 1.6 centimeter mass the LEFT hepatic lobe. Recommend comparison with prior studies if available. 5.  Emphysema (ICD10-J43.9). 6.  Aortic Atherosclerosis (ICD10-I70.0). 7. Bilateral renal scarring.   03/03/2020 Surgery   low anterior resection end colostomy by Dr Marcello Moores    03/03/2020 Initial Biopsy   FINAL MICROSCOPIC DIAGNOSIS: 03/03/20 A. RECTOSIGMOID COLON, LOW ANTERIOR RESECTION:  - Invasive colonic adenocarcinoma, 3.5 cm.  - Tumor invades the visceral peritoneum.  - Margins of resection are not involved.  - Metastatic carcinoma in (3) of (15) lymph nodes.  - See oncology table.   B. ADDITIONAL SIGMOID COLON, RESECTION:  - Colonic tissue, negative for carcinoma.  ADDENDUM:  Mismatch Repair Protein (IHC)   SUMMARY INTERPRETATION: NORMAL  There is preserved expression of the major MMR proteins. There is a very  low probability that microsatellite instability (MSI) is present.  However, certain clinically significant MMR protein mutations may result  in preservation of  nuclear expression. It is recommended that the  preservation of protein expression be  correlated with molecular based  MSI testing.   IHC EXPRESSION RESULTS  TEST           RESULT  MLH1:          Preserved nuclear expression  MSH2:          Preserved nuclear expression  MSH6:          Preserved nuclear expression  PMS2:          Preserved nuclear expression   03/03/2020 Cancer Staging   Staging form: Colon and Rectum, AJCC 8th Edition - Pathologic stage from 03/03/2020: Stage IIIB (pT4a, pN1b, cM0) - Signed by [name], MD on 01/28/2021 Stage prefix: Initial diagnosis Histologic grading system: 4 grade system Histologic grade (G): G2 Residual tumor (R): R0 - None   03/08/2020 Imaging   CT AP 03/08/20 IMPRESSION: 1. Interval midline laparotomy with distal colon resection and diverting left lower quadrant colostomy. 2. Diffuse small bowel dilatation with gas fluid levels most consistent with postoperative ileus. 3. Trace ascites within the abdomen and pelvis. No fluid collection or abscess at this time. Surgical drain within the lower pelvis. 4. Indeterminate 1.4 cm subcapsular liver hypodensity. In light of newly diagnosed rectal cancer, metastatic disease cannot be excluded. PET CT may be useful for further evaluation. 5. Interval development of trace bilateral pleural effusions and diffuse body wall edema.   03/12/2020 Surgery   EXPLORATORY LAPAROTOMY WITH BOWEL RESECTION AND COLOSTOMY by Dr Hassell Done 03/12/20  FINAL MICROSCOPIC DIAGNOSIS: 03/12/20  A. COLON, SPLENIC FLEXURE, RESECTION:  - Segment of colon (37 cm) with perforation and associated inflammation  - Multiple mucosal ulcers with necrotizing inflammation  - No evidence of malignancy    03/12/2020 Imaging   CT AP WO contrast 03/12/20  IMPRESSION: 1. Exam is limited by lack of intravenous contrast, motion, and artifact from the patient's arms adjacent to the torso. Since 03/08/2020, there has been development of relatively large pockets of intraperitoneal free air. While intraperitoneal gas would not  be unexpected on postoperative day 9, this gas is new since an intervening study of 03/08/2020 and given the relatively large volume certainly raises concern for bowel perforation. No source for the intraperitoneal free air is evident on this study. There is a surgical drain in the pelvis, but there is no free gas around the drain itself to suggest that it represents the source. 2. New circumferential wall thickening in the splenic flexure, descending colon and sigmoid colon leading into the end colostomy. Infection/inflammation would be a consideration. Ischemia cannot be excluded. 3. Relatively small volume intraperitoneal free fluid. High attenuation small fluid collections in the left upper abdomen may reflect hemorrhage, infection or residua from prior perforation. 4. Interval progression of diffuse body wall edema. 5. Residual contrast material in the renal parenchyma from prior imaging, compatible with renal dysfunction.    03/19/2020 Imaging   CT CAP 03/19/20  IMPRESSION: 1. 5.5 x 3.2 cm fluid collection is noted along the greater curvature of the proximal stomach. 2. Surgical drain is again noted in the pelvis with tip in left lower quadrant. 3. Interval development of crescent-shaped fluid collection measuring 16.4 x 3.3 cm in the epigastric region and left upper quadrant of the abdomen which may extend into the left lower quadrant. Potentially this may represent abscess or developing abscess. Multiple other smaller fluid collections are noted which may represent small abscesses. 4. Colostomy is noted in the left  lower quadrant. 5. Mild amount of free fluid is noted in the posterior pelvis. 6. Moderate anasarca is noted. Aortic Atherosclerosis (ICD10-I70.0).   04/12/2020 Imaging   CT AP 04/12/20  IMPRESSION: 1. Fluid collections within the LEFT abdomen have significantly decreased compared to previous CT exams, now nearly completely resolved. 2. Percutaneous drainage  catheter with tip coiled posterior to the LEFT kidney, stable positioning compared to the previous study. The more anterior catheter has been removed. 3. Small amount of free fluid persists within the abdomen and pelvis. 4. Trace bibasilar pleural effusions. 5. Anasarca. 6. While reviewing today's study, comparing with a chest/abdomen/pelvis CT from earlier same month, there is question of thrombus in the LEFT internal jugular vein. Recommend ultrasound of the LEFT IJ to exclude DVT. This recommendation discussed with patient's hospitalist, Dr. Owens Shark, on 04/12/2020 at 4:20 p.m.   Emphysema (ICD10-J43.9).   04/22/2020 Imaging   CT AP 04/22/20  IMPRESSION: 1. No recurrent intra-abdominal abscess status post interval removal of left retroperitoneal percutaneous drain. Stable small amount of free pelvic fluid. 2. Enlarging dependent bilateral pleural effusions, now moderate in volume. Associated atelectasis at both lung bases. 3. Progressive anasarca with generalized edema throughout the subcutaneous fat. 4. Stable small subcapsular fluid collection along the anterior aspect of the left hepatic lobe. 5. Aortic Atherosclerosis (ICD10-I70.0).   05/23/2020 Initial Diagnosis   Rectal cancer ([place])   01/14/2021 Imaging   CT CAP IMPRESSION: -5.0 cm soft tissue mass along the posterior aspect of the cecum at the base of the appendix, favored to reflect a peritoneal/serosal implant, corresponding to the patient's known adenocarcinoma. This is new from the prior. -Suspected additional pelvic implants along the uterus and right adnexa, poorly visualized, new/progressive from the prior. -Additional pericapsular lesion along the lateral spleen is mildly progressive, suspicious for additional peritoneal implant. -No evidence of metastatic disease in the chest.   01/27/2021 PET scan   IMPRESSION:  1. Extensive hypermetabolic peritoneal metastasis, as detailed  above.  2. No evidence of  hypermetabolic supradiaphragmatic disease.  3. Diffuse low-level thyroid hypermetabolism can be seen in the  setting of thyroiditis. Consider correlation with thyroid function  labs.  4. Aortic atherosclerosis (ICD10-I70.0) and emphysema (ICD10-J43.9).    02/01/2021 -  Chemotherapy    First-line chemo Xeloda 1500 mg twice daily for day 1-14, every 21 days, started on 02/01/2021. Reduced to 1 week on/ 1 week off and added bevacizumab q2weeks from C2 on 02/24/21.  ----Given good tolerance, I changed to CAPOX and bevacizumab q2weeks from C4 on 03/24/21 with Xeloda '1500mg'$  in the AM and '1000mg'$  in the PM 1 week on/1 week off.     03/24/2021 -  Chemotherapy   Patient is on Treatment Plan : COLORECTAL CapeOx + Bevacizumab q21d     05/08/2021 Imaging   PET  IMPRESSION: 1. There is been interval development of several FDG avid pulmonary nodules within both lungs. Additionally, there is new FDG avid low left paratracheal lymph node. Thoracic metastasis cannot be excluded. 2. Interval improvement in multifocal FDG avid peritoneal metastasis as detailed above.     08/14/2021 Imaging   CT CAP  IMPRESSION: Resolution of previously seen small bilateral pulmonary nodules since previous study.   Stable 8 mm mediastinal lymph node in the AP window.   No evidence of metastatic disease within the abdomen or pelvis.   Nonocclusive pulmonary embolism in bilateral central lower lobe pulmonary arteries, which appears subacute in age.   12/25/2021 Imaging   EXAM: CT CHEST, ABDOMEN, AND  PELVIS WITH CONTRAST  IMPRESSION: 1. Minimal, bandlike residua of previously seen FDG avid pulmonary nodules, consistent with treated metastases. No new pulmonary nodules. 2. Unchanged post treatment appearance of the cecal base, with soft tissue thickening about the cecal base and adjacent pelvis, previously FDG avid and in keeping with known appendiceal orifice malignancy. 3. Unchanged central fluid attenuation lesions  about the spleen, left iliopsoas, and left pelvic sidewall, previously FDG avid, consistent with treated peritoneal implants. 4. No evidence of new metastatic disease in the chest, abdomen, or pelvis 5. Status post rectosigmoid colon resection with left lower quadrant end colostomy.   Aortic Atherosclerosis (ICD10-I70.0).   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 - Polypoid lesion obliterating appendiceal orifice. Biopsied to evaluate for malignancy. - The examined portion of the ileum was normal. - Two large polyps in the cecum, not removed today pending path at appendiceal orifice, bowel prep. If removed endoscopically in the future would favor EMR to be done at the hospital. - One 5 mm polyp at the ileocecal valve, removed with a cold snare. Resected and retrieved. - One 3 mm polyp in the transverse colon, removed with a cold snare. Resected and retrieved. - Parastomal hernia making initial entry to the distal colon challenging. - The examination was otherwise normal.   01/06/2021 Initial Biopsy   Diagnosis 1. Colon, biopsy, appendiceal oraface - ADENOCARCINOMA. 2. Colon, polyp(s), ileocecal valve, transverse, x2 - TUBULAR ADENOMA, NEGATIVE FOR HIGH GRADE DYSPLASIA (X1). - COLONIC MUCOSA WITH UNDERLYING LYMPHOID AGGREGATE, NEGATIVE FOR DYSPLASIA (X1).  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 This is new from the prior. -Suspected additional pelvic implants along the uterus and right adnexa, poorly visualized, new/progressive from the prior. -Additional pericapsular lesion along the lateral spleen is mildly progressive, suspicious for additional peritoneal implant. -No evidence of metastatic disease in the chest.   08/14/2021 Imaging   CT CAP IMPRESSION: Resolution of previously seen small bilateral pulmonary nodules since previous study. Stable 8 mm mediastinal lymph node in the AP window. No evidence of metastatic disease within the abdomen or pelvis. Nonocclusive pulmonary embolism in bilateral central lower lobe pulmonary arteries, which appears subacute in age.  Aortic Atherosclerosis (ICD10-I70.0) and Emphysema (ICD10-J43.9).     08/14/2021 Imaging   CT CAP  IMPRESSION: Resolution of previously seen small bilateral pulmonary nodules since previous study.   Stable 8 mm mediastinal lymph node in the AP window.   No evidence of metastatic disease within the abdomen or pelvis.   Nonocclusive pulmonary embolism in bilateral central lower lobe pulmonary arteries, which appears subacute in age.   12/25/2021 Imaging   EXAM: CT CHEST, ABDOMEN, AND PELVIS WITH CONTRAST  IMPRESSION: 1. Minimal, bandlike residua of previously seen FDG avid pulmonary nodules, consistent with treated metastases. No new pulmonary nodules. 2. Unchanged post treatment appearance of the cecal base, with soft tissue thickening about the cecal base and adjacent  pelvis, previously FDG avid and in keeping with known appendiceal orifice malignancy. 3. Unchanged central fluid attenuation lesions about the spleen, left iliopsoas, and left pelvic sidewall, previously FDG avid, consistent with treated peritoneal implants. 4. No evidence of new metastatic disease in the chest, abdomen, or pelvis 5. Status post rectosigmoid colon resection with left lower quadrant end  colostomy.   Aortic Atherosclerosis (ICD10-I70.0).      CURRENT THERAPY:  -CAPOX  -(Xeloda started 02/01/21 currently 1500 mg BID 7 days on/7 days off);  -oxaliplatin added with C4 03/24/21, currently q3 weeks;  -bevacizumab q2-3 weeks added with C2 on 02/24/21  (held 9/22 - 1/23 for PE)  INTERVAL HISTORY Ms. Hohler presents virtually for CT scan results. Last seen by Cassie H, PA 11/23/22 and proceeded with another cycle of CapeOx/Beva. She subsequently developed fever/cough and tested positive for COVID. She stopped Xeloda 1/3. She has mild residual cough but feels much better, nearly back to baseline. About 1 month ago she noticed tingling in her toes, no numbness, but is fairly constant. Nothing in her fingers. Denies hand/foot syndrome. Otherwise doing well. Eating/drinking. Bowels moving. No pain, n/v, fever, chills, chest pain, worsening dyspnea, or other specific complaint.s   ROS  All other systems reviewed and negative  Past Medical History:  Diagnosis Date   Acute deep vein thrombosis (DVT) of brachial vein of right upper extremity (HCC)    Acute on chronic respiratory failure with hypoxia ([place])    Asthma    Cataract    CHICKENPOX, HX OF 01/06/2011   Qualifier: Diagnosis of  By: Charlett Blake MD, Stacey     Colon cancer [facility])    COPD (chronic obstructive pulmonary disease) ([place]) 2011   FeV1 31% predicted FeV1/FVX 47 %   COVID-19 05/2021   Essential hypertension 05/23/2020   Hemorrhoid    Open right radial fracture 2020   Osteopenia    Perforated sigmoid colon ([place]) 03/03/2020   Pleural effusion    Pneumonia 2021   Postoperative intra-abdominal abscess 2021   Rectal cancer (HCC)    Seasonal allergies    takes Claritin daily prn   Shock circulatory ([place])    Tracheostomy status ([place])    Vitamin D deficiency    takes Vit d every 14 days     Past Surgical History:  Procedure Laterality Date   AUGMENTATION MAMMAPLASTY     saline   EXAMINATION UNDER ANESTHESIA  10/14/2012    Procedure: EXAM UNDER ANESTHESIA;  Surgeon: Gayland Curry, MD,FACS;  Location: [place];  Service: General;  Laterality: N/A;  rectal exam under anesthesia excisional hemorrhoidectomy hemorrhoidal banding x two   EXAMINATION UNDER ANESTHESIA  02/03/2013   excision hemorrhoidal tissue   FOOT SURGERY     left bunionectomy   HEMORRHOIDECTOMY WITH HEMORRHOID BANDING  10/14/2012   Procedure: HEMORRHOIDECTOMY WITH HEMORRHOID BANDING;  Surgeon: Gayland Curry, MD,FACS;  Location: [place];  Service: General;  Laterality: N/A;   IR IMAGING GUIDED PORT INSERTION  02/03/2021   IR THORACENTESIS ASP PLEURAL SPACE W/IMG GUIDE  04/23/2020   LAPAROTOMY N/A 03/03/2020   Procedure: low anterior resection end colostomy;  Surgeon: [name], MD;  Location: [facility];  Service: General;  Laterality: N/A;   LAPAROTOMY N/A 03/12/2020   Procedure: EXPLORATORY LAPAROTOMY WITH BOWEL RESECTION AND COLOSTOMY;  Surgeon: [name], MD;  Location: [facility];  Service: General;  Laterality: N/A;   OPEN REDUCTION INTERNAL FIXATION (ORIF) DISTAL RADIAL FRACTURE Right 11/01/2018   Procedure: OPEN REDUCTION INTERNAL FIXATION (ORIF)  DISTAL RADIAL FRACTURE;  Surgeon: [name], MD;  Location: [place];  Service: Orthopedics;  Laterality: Right;   TONSILLECTOMY  1960   recurrent otitis media   US ECHOCARDIOGRAPHY  02/2020    poor windows, normal LV function, severely dilated RV with moderately reduced function, RV volume and pressure overload, mildly dilated RA     Outpatient Encounter Medications as of 12/24/2022  Medication Sig   acetaminophen (TYLENOL) 325 MG tablet Take 2 tablets (650 mg total) by mouth every 6 (six) hours as needed for mild pain.   albuterol (PROVENTIL) (2.5 MG/3ML) 0.083% nebulizer solution Take 3 mLs (2.5 mg total) by nebulization every 4 (four) hours as needed for wheezing or shortness of breath. DX: J44.9   albuterol (VENTOLIN HFA) 108 (90 Base) MCG/ACT inhaler Inhale 2 puffs into the  lungs every 6 (six) hours as needed for wheezing or shortness of breath.   ALPRAZolam (XANAX) 0.25 MG tablet Take 1 tablet (0.25 mg total) by mouth at bedtime as needed for anxiety.   capecitabine (XELODA) 500 MG tablet Take 3 tablets every 12 hours. Take for 7 days on, 7 days off, repeat every 14 days.   docusate sodium (COLACE) 50 MG capsule Take 50 mg by mouth daily as needed for mild constipation.   fluticasone (FLONASE) 50 MCG/ACT nasal spray SPRAY 2 SPRAYS INTO EACH NOSTRIL EVERY DAY   Fluticasone-Umeclidin-Vilant (TRELEGY ELLIPTA) 100-62.5-25 MCG/ACT AEPB Inhale 1 puff into the lungs daily.   HYDROcodone bit-homatropine (HYCODAN) 5-1.5 MG/5ML syrup Take 5 mLs by mouth every 6 (six) hours as needed for cough. (Patient not taking: Reported on 11/23/2022)   lidocaine-prilocaine (EMLA) cream Apply 1 application. topically as needed.   loratadine (CLARITIN) 10 MG tablet Take 1 tablet (10 mg total) by mouth daily.   losartan (COZAAR) 25 MG tablet TAKE 1 TABLET (25 MG TOTAL) BY MOUTH DAILY.   ondansetron (ZOFRAN) 8 MG tablet Take 1 tablet (8 mg total) by mouth every 8 (eight) hours as needed for nausea or vomiting.   prochlorperazine (COMPAZINE) 10 MG tablet Take 1 tablet (10 mg total) by mouth every 6 (six) hours as needed for nausea or vomiting.   rivaroxaban (XARELTO) 20 MG TABS tablet Take 1 tablet (20 mg total) by mouth daily with supper.   [DISCONTINUED] capecitabine (XELODA) 500 MG tablet Take 3 tablets every 12 hours. Take for 7 days on, 7 days off, repeat every 14 days.   No facility-administered encounter medications on file as of 12/24/2022.     There were no vitals filed for this visit. There is no height or weight on file to calculate BMI.   PHYSICAL EXAM Patient appears well over the phone.  Voice is strong, speech is clear.  Mood/affect appear normal for situation.  No cough or conversational dyspnea  CBC    Component Value Date/Time   WBC 5.3 11/23/2022 0939   WBC 7.3  07/13/2022 1007   RBC 3.71 (L) 11/23/2022 0939   HGB 12.8 11/23/2022 0939   HCT 38.7 11/23/2022 0939   PLT 285 11/23/2022 0939   MCV 104.3 (H) 11/23/2022 0939   MCH 34.5 (H) 11/23/2022 0939   MCHC 33.1 11/23/2022 0939   RDW 14.4 11/23/2022 0939   LYMPHSABS 1.4 11/23/2022 0939   MONOABS 0.5 11/23/2022 0939   EOSABS 0.2 11/23/2022 0939   BASOSABS 0.0 11/23/2022 0939     CMP     Component Value Date/Time   NA 139 11/23/2022 0939   K 3.7 11/23/2022 8242  CL 106 11/23/2022 0939   CO2 25 11/23/2022 0939   GLUCOSE 97 11/23/2022 0939   BUN 20 11/23/2022 0939   CREATININE 0.78 11/23/2022 0939   CALCIUM 9.3 11/23/2022 0939   PROT 6.7 11/23/2022 0939   ALBUMIN 3.9 11/23/2022 0939   AST 16 11/23/2022 0939   ALT 8 11/23/2022 0939   ALKPHOS 109 11/23/2022 0939   BILITOT 0.5 11/23/2022 0939   GFRNONAA >60 11/23/2022 0939   GFRAA >60 08/27/2020 0857     ASSESSMENT & PLAN: 72 yo female   1. [name], MMR normal, peritoneal metastasis in 01/2021 -diagnosed 02/2020, s/p emergent surgery for perforation. Path showed invasive adenocarcinoma with perforation, +3/15 LNs, and clear margins. Initial CT scan negative for distant metastasis. -due to bowel perforation and + LNs she has very high risk for recurrence, unfortunately she was not a candidate for adjuvant chemo due to very slow recovery from surgery -Guardant Reveal ctDNA were all negative x3 (06/06/20, 07/12/20, and 09/10/20). -first surveillance colonoscopy by Dr. Havery Moros 01/06/21 showed new adenocarcinoma at appendiceal orifice and other tubular adenomas throughout the colon.  Biopsy confirmed adenocarcinoma -01/27/2021 PET showed tumor in appendiceal orifice is likely peritoneal metastasis growing into the cecum.  Her peritoneal implants are mainly in the pelvis, difficult to biopsy -She began single agent Xeloda with cycle 1 on 2/19. PS improved and low dose oxaliplatin was added with C4, now on  Capeox/beva q3 weeks, with Xeloda 1 week on/1 week off. -Beva was held for PE found on CT 08/2021, she is on xarelto. Resumed beva q3 weeks 12/29/21 -Restaging CT CAP 08/20/2022 was stable, no new or progressive findings. -Ms Funez appears stable over the phone.  She continues CapeOx with Xeloda 1500 mg twice daily 1 week on/1 week off, oxaliplatin, and bevacizumab every 3 weeks.  Tolerates well overall.  - She has signs of early neuropathy (tingling) in her toes, I recommend to start B complex vitamin. -I reviewed her restaging CT personally and discussed with Dr. Burr Medico, which shows stable disease; no new or progressive findings. -The recommendation is to continue CapeOx at same dose, every 3 weeks.  -She has recovered well from last cycle and recent COVID, plan to resume treatment next week; she will start Xeloda on the day of her appointments.   2. 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 She was asymptomatic  - began xarelto 08/15/21 -Beva was on hold, resumed 12/29/2021   3. COVID-19 + 05/21/21 and 11/2022 - tested positive on 05/21/21, treated with paxlovid and subsequently doxycycline, augment, and prednisone  -She had recurrent cough in 03/2022 completed additional antibiotics -She recently had COVID again, after last cycle chemo in December; symptoms were fever and cough.   -She has recovered well   4. Comorbidities: Asthma, COPD, anxiety -on nebulizer and trelegy -f/up pulmonology and PCP   PLAN: -Restaging CT CAP from 12/22/2022 reviewed, stable -Continue same regimen: CapeOx with Xeloda 1500 mg twice daily for 7 days on/7 days off, dose-reduced oxaliplatin, and Beva every 3 weeks -Follow-up and resume treatment next week, she will start Xeloda same day of her appointments -Imaging reviewed with Dr. 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 The patient was provided an opportunity to ask questions and all were  answered. The patient agreed with the plan and demonstrated an understanding of the instructions.   The patient was advised to call back or seek an in-person evaluation if the symptoms worsen or if the condition fails to improve as anticipated. I spent 15 minutes in today's encounter.    Cira Rue, NP-C 12/24/2022

## 2022-12-25 ENCOUNTER — Other Ambulatory Visit: Payer: Self-pay

## 2022-12-26 ENCOUNTER — Other Ambulatory Visit: Payer: Self-pay

## 2022-12-31 NOTE — Progress Notes (Signed)
[place]   [phone] [phone]   Clinic Follow up Note   Patient Care Team: [name], DO as PCP - General (Family Medicine) [name], MD as Consulting Physician (Hematology) Icard, Octavio Graves, DO as Consulting Physician (Pulmonary Disease) Metzger, P.A. [name], MD as Consulting Physician (General Surgery) [name], MD as Consulting Physician (General Surgery) [name], [name], MD as Consulting Physician (Gastroenterology)  Date of Service:  01/04/2023  CHIEF COMPLAINT: f/u of colorectal cancer   CURRENT THERAPY:  CapeOx with Xeloda 1500 mg twice daily for 7 days on/7 days off, dose-reduced oxaliplatin, and Beva every 3 weeks   ASSESSMENT:  Tracy [name] is a 72 y.o. 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 Path showed invasive adenocarcinoma with perforation, +3/15 LNs, and clear margins. Initial CT scan negative for distant metastasis. She was not a candidate for adjuvant chemo due to very slow recovery from surgery -Guardant Reveal ctDNA were negative (06/06/20, 07/12/20, and 09/10/20). -surveillance colonoscopy by Dr. Havery Moros 01/06/21 showed new adenocarcinoma at appendiceal orifice. Biopsy confirmed adenocarcinoma -01/27/21 PET showed tumor in appendiceal orifice is likely peritoneal metastasis growing into the cecum. Her peritoneal implants are mainly in the pelvis, difficult to biopsy -She began Xeloda on 02/01/21. Cycle 2 was changed to 1 week on/1 week off, and beva was added. She tolerates this well -PS improved and low dose oxaliplatin was added with C4, now on Capeox/beva q3 weeks, with continuing Xeloda 1 week on/1 week off. -Beva was held for PE found on CT 08/2021, she is on xarelto. Resumed beva q3 weeks 12/29/21 -she twisted her ankle on 07/22/22, Oxaliplatin and beva were  held and restarted on 9/14. She continued Xeloda as prescribed through recovery.  -restaging CT CAP on 08/20/22 was stable, with no new or progressive findings - restaging CT from 12/23/2022 showed increased liver lesion, concerning for cancer progression. Her CEA has been slowly trending up, also concerning for cancer progression.  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   PLAN: - lab reviewed -Tumor Marker to be repeat today -precede with C6 oxaliplatin +beva  at reduce dose due to increase neuropathy  - discuss new regiment 5FU and irinotecan every 2 months, will decide after her PET scan - order PET scan  for 2wks - phone visit 2 days after PET scan    SUMMARY OF ONCOLOGIC HISTORY: Oncology History Overview Note  Cancer Staging Rectal cancer [facility]) Staging form: Colon and Rectum, AJCC 8th Edition - Pathologic stage from 03/03/2020: Stage IIIB (pT4a, pN1b, cM0) - Signed by [name], MD on 01/28/2021 Stage prefix: Initial diagnosis Histologic grading system: 4 grade system Histologic grade (G): G2 Residual tumor (R): R0 - None    Rectosigmoid cancer ([place])  03/03/2020 - 04/05/2020 Hospital Admission   She was admitted to ED on 03/03/20 for abdominal pain and nausea. She had been having diarrhea for several weeks. During hospital stay she developed acute respiratory failure, AKI, RUE DVT from PICC line. Work up showed bowel perforation, small left liver mass and thickening of jejunum. She underwent emergent surgery on 03/03/20 for resection and colostomy placement. Her path showed invasive cancer, metastatic to 3/15 LNs. She had a  NGT in placed but this was removed on POD 3. Post op her stoma became necrotic and septic. She had another bowel surgery on 03/12/20, which was NED with necrotic tissue.    03/03/2020  Imaging   CT AP 03/03/20  IMPRESSION: 1. Free intraperitoneal air consistent perforation. Most likely site of perforation is in the distal splenic flexure/proximal descending colon where there is a collection of air and gas measuring 6 centimeters. No obvious soft tissue mass identified in this region. At this site, there is abrupt transition of dilated, stool-filled colon to completely decompressed proximal descending colon. 2. Thickened, inflamed loops of jejunum are identified within the pelvis and are likely reactive. 3. Small hiatal hernia. 4. Benign-appearing 1.6 centimeter mass the LEFT hepatic lobe. Recommend comparison with prior studies if available. 5.  Emphysema (ICD10-J43.9). 6.  Aortic Atherosclerosis (ICD10-I70.0). 7. Bilateral renal scarring.   03/03/2020 Surgery   low anterior resection end colostomy by Dr Marcello Moores    03/03/2020 Initial Biopsy   FINAL MICROSCOPIC DIAGNOSIS: 03/03/20 A. RECTOSIGMOID COLON, LOW ANTERIOR RESECTION:  - Invasive colonic adenocarcinoma, 3.5 cm.  - Tumor invades the visceral peritoneum.  - Margins of resection are not involved.  - Metastatic carcinoma in (3) of (15) lymph nodes.  - See oncology table.   B. ADDITIONAL SIGMOID COLON, RESECTION:  - Colonic tissue, negative for carcinoma.  ADDENDUM:  Mismatch Repair Protein (IHC)   SUMMARY INTERPRETATION: NORMAL  There is preserved expression of the major MMR proteins. There is a very  low probability that microsatellite instability (MSI) is present.  However, certain clinically significant MMR protein mutations may result  in preservation of nuclear expression. It is recommended that the  preservation of protein expression be correlated with molecular based  MSI testing.   IHC EXPRESSION RESULTS  TEST           RESULT  MLH1:          Preserved nuclear expression  MSH2:          Preserved nuclear expression  MSH6:          Preserved nuclear expression  PMS2:          Preserved nuclear  expression   03/03/2020 Cancer Staging   Staging form: Colon and Rectum, AJCC 8th Edition - Pathologic stage from 03/03/2020: Stage IIIB (pT4a, pN1b, cM0) - Signed by [name], MD on 01/28/2021 Stage prefix: Initial diagnosis Histologic grading system: 4 grade system Histologic grade (G): G2 Residual tumor (R): R0 - None   03/08/2020 Imaging   CT AP 03/08/20 IMPRESSION: 1. Interval midline laparotomy with distal colon resection and diverting left lower quadrant colostomy. 2. Diffuse small bowel dilatation with gas fluid levels most consistent with postoperative ileus. 3. Trace ascites within the abdomen and pelvis. No fluid collection or abscess at this time. Surgical drain within the lower pelvis. 4. Indeterminate 1.4 cm subcapsular liver hypodensity. In light of newly diagnosed rectal cancer, metastatic disease cannot be excluded. PET CT may be useful for further evaluation. 5. Interval development of trace bilateral pleural effusions and diffuse body wall edema.   03/12/2020 Surgery   EXPLORATORY LAPAROTOMY WITH BOWEL RESECTION AND COLOSTOMY by Dr Hassell Done 03/12/20  FINAL MICROSCOPIC DIAGNOSIS: 03/12/20  A. COLON, SPLENIC FLEXURE, RESECTION:  - Segment of colon (37 cm) with perforation and associated inflammation  - Multiple mucosal ulcers with necrotizing inflammation  - No evidence of malignancy    03/12/2020 Imaging   CT AP WO contrast 03/12/20  IMPRESSION: 1. Exam is  limited by lack of intravenous contrast, motion, and artifact from the patient's arms adjacent to the torso. Since 03/08/2020, there has been development of relatively large pockets of intraperitoneal free air. While intraperitoneal gas would not be unexpected on postoperative day 9, this gas is new since an intervening study of 03/08/2020 and given the relatively large volume certainly raises concern for bowel perforation. No source for the intraperitoneal free air is evident on this study. There is a surgical  drain in the pelvis, but there is no free gas around the drain itself to suggest that it represents the source. 2. New circumferential wall thickening in the splenic flexure, descending colon and sigmoid colon leading into the end colostomy. Infection/inflammation would be a consideration. Ischemia cannot be excluded. 3. Relatively small volume intraperitoneal free fluid. High attenuation small fluid collections in the left upper abdomen may reflect hemorrhage, infection or residua from prior perforation. 4. Interval progression of diffuse body wall edema. 5. Residual contrast material in the renal parenchyma from prior imaging, compatible with renal dysfunction.    03/19/2020 Imaging   CT CAP 03/19/20  IMPRESSION: 1. 5.5 x 3.2 cm fluid collection is noted along the greater curvature of the proximal stomach. 2. Surgical drain is again noted in the pelvis with tip in left lower quadrant. 3. Interval development of crescent-shaped fluid collection measuring 16.4 x 3.3 cm in the epigastric region and left upper quadrant of the abdomen which may extend into the left lower quadrant. Potentially this may represent abscess or developing abscess. Multiple other smaller fluid collections are noted which may represent small abscesses. 4. 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 5. Mild amount of free fluid is noted in the posterior pelvis. 6. Moderate anasarca is noted. Aortic Atherosclerosis (ICD10-I70.0).   04/12/2020 Imaging   CT AP 04/12/20  IMPRESSION: 1. Fluid collections within the LEFT abdomen have significantly decreased compared to previous CT exams, now nearly completely resolved. 2. Percutaneous drainage catheter with tip coiled posterior to the LEFT kidney, stable positioning compared to the previous study. The more anterior catheter has been removed. 3. Small amount of free fluid persists within the abdomen and pelvis. 4. Trace bibasilar pleural effusions. 5.  Anasarca. 6. While reviewing today's study, comparing with a chest/abdomen/pelvis CT from earlier same month, there is question of thrombus in the LEFT internal jugular vein. Recommend ultrasound of the LEFT IJ to exclude DVT. This recommendation discussed with patient's hospitalist, Dr. Owens Shark, on 04/12/2020 at 4:20 p.m.   Emphysema (ICD10-J43.9).   04/22/2020 Imaging   CT AP 04/22/20  IMPRESSION: 1. No recurrent intra-abdominal abscess status post interval removal of left retroperitoneal percutaneous drain. Stable small amount of free pelvic fluid. 2. Enlarging dependent bilateral pleural effusions, now moderate in volume. Associated atelectasis at both lung bases. 3. Progressive anasarca with generalized edema throughout the subcutaneous fat. 4. Stable small subcapsular fluid collection along the anterior aspect of the left hepatic lobe. 5. Aortic Atherosclerosis (ICD10-I70.0).   05/23/2020 Initial Diagnosis   Rectal cancer ([place])   01/14/2021 Imaging   CT CAP IMPRESSION: -5.0 cm soft tissue mass along the posterior aspect of the cecum at the base of the appendix, favored to reflect a peritoneal/serosal implant, corresponding to the patient's known adenocarcinoma. This is new from the prior. -Suspected additional pelvic implants along the uterus and right adnexa, poorly visualized, new/progressive from the prior. -Additional pericapsular lesion along the lateral spleen is mildly progressive, suspicious for additional peritoneal implant. -No evidence of metastatic disease in the  chest.   01/27/2021 PET scan   IMPRESSION:  1. Extensive hypermetabolic peritoneal metastasis, as detailed  above.  2. No evidence of hypermetabolic supradiaphragmatic disease.  3. Diffuse low-level thyroid hypermetabolism can be seen in the  setting of thyroiditis. Consider correlation with thyroid function  labs.  4. Aortic atherosclerosis (ICD10-I70.0) and emphysema (ICD10-J43.9).    02/01/2021 -   Chemotherapy    First-line chemo Xeloda 1500 mg twice daily for day 1-14, every 21 days, started on 02/01/2021. Reduced to 1 week on/ 1 week off and added bevacizumab q2weeks from C2 on 02/24/21.  ----Given good tolerance, I changed to CAPOX and bevacizumab q2weeks from C4 on 03/24/21 with Xeloda '1500mg'$  in the AM and '1000mg'$  in the PM 1 week on/1 week off.     03/24/2021 -  Chemotherapy   Patient is on Treatment Plan : COLORECTAL CapeOx + Bevacizumab q21d     05/08/2021 Imaging   PET  IMPRESSION: 1. There is been interval development of several FDG avid pulmonary nodules within both lungs. Additionally, there is new FDG avid low left paratracheal lymph node. Thoracic metastasis cannot be excluded. 2. Interval improvement in multifocal FDG avid peritoneal metastasis as detailed above.     08/14/2021 Imaging   CT CAP  IMPRESSION: Resolution of previously seen small bilateral pulmonary nodules since previous study.   Stable 8 mm mediastinal lymph node in the AP window.   No evidence of metastatic disease within the abdomen or pelvis.   Nonocclusive pulmonary embolism in bilateral central lower lobe pulmonary arteries, which appears subacute in age.   12/25/2021 Imaging   EXAM: CT CHEST, ABDOMEN, AND PELVIS WITH CONTRAST  IMPRESSION: 1. Minimal, bandlike residua of previously seen FDG avid pulmonary nodules, consistent with treated metastases. No new pulmonary nodules. 2. Unchanged post treatment appearance of the cecal base, with soft tissue thickening about the cecal base and adjacent pelvis, previously FDG avid and in keeping with known appendiceal orifice malignancy. 3. Unchanged central fluid attenuation lesions about the spleen, left iliopsoas, and left pelvic sidewall, previously FDG avid, consistent with treated peritoneal implants. 4. No evidence of new metastatic disease in the chest, abdomen, or pelvis 5. Status post rectosigmoid colon resection with left lower  quadrant end colostomy.   Aortic Atherosclerosis (ICD10-I70.0).   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 - Polypoid lesion obliterating appendiceal orifice. Biopsied to evaluate for malignancy. - The examined portion of the ileum was normal. - Two large polyps in the cecum, not removed today pending path at appendiceal orifice, bowel prep. If removed endoscopically in the future would favor EMR to be done at the hospital. - One 5 mm polyp at the ileocecal valve, removed with a cold snare. Resected and retrieved. - One 3 mm polyp in the transverse colon, removed with a cold snare. Resected and retrieved. - Parastomal hernia making initial entry to the distal colon challenging. - The examination was otherwise normal.   01/06/2021 Initial Biopsy   Diagnosis 1. Colon, biopsy, appendiceal oraface - ADENOCARCINOMA. 2. Colon, polyp(s), ileocecal valve, transverse, x2 - TUBULAR ADENOMA, NEGATIVE FOR HIGH GRADE DYSPLASIA (X1). - COLONIC MUCOSA WITH UNDERLYING LYMPHOID AGGREGATE, NEGATIVE FOR DYSPLASIA (X1).  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 This is new from the prior. -Suspected additional pelvic implants along the uterus and right adnexa, poorly visualized, new/progressive from the prior. -Additional pericapsular lesion along the lateral spleen is  mildly progressive, suspicious for additional peritoneal implant. -No evidence of metastatic disease in the chest.   08/14/2021 Imaging   CT CAP IMPRESSION: Resolution of previously seen small bilateral pulmonary nodules since previous study. Stable 8 mm mediastinal lymph node in the AP window. No evidence of metastatic disease within the abdomen or pelvis. Nonocclusive pulmonary embolism in bilateral central lower lobe pulmonary arteries, which appears subacute in age.  Aortic Atherosclerosis (ICD10-I70.0) and Emphysema (ICD10-J43.9).     08/14/2021 Imaging   CT CAP  IMPRESSION: Resolution of previously seen small bilateral pulmonary nodules since previous study.   Stable 8 mm mediastinal lymph node in the AP window.   No evidence of metastatic disease within the abdomen or pelvis.   Nonocclusive pulmonary embolism in bilateral central lower lobe pulmonary arteries, which appears subacute in age.   12/25/2021 Imaging   EXAM: CT CHEST, ABDOMEN, AND PELVIS WITH CONTRAST  IMPRESSION: 1. Minimal, bandlike residua of previously seen FDG avid pulmonary nodules, consistent with treated metastases. No new pulmonary nodules. 2. Unchanged post treatment appearance of the cecal base, with soft tissue thickening about the cecal base and adjacent pelvis, previously FDG avid and in keeping with known appendiceal orifice malignancy. 3. Unchanged central fluid attenuation lesions about the spleen, left iliopsoas, and left pelvic sidewall, previously FDG avid, consistent with treated peritoneal implants. 4. No evidence of new metastatic disease in the chest, abdomen, or pelvis 5. Status post rectosigmoid colon resection with left lower quadrant end colostomy.   Aortic Atherosclerosis (ICD10-I70.0).      INTERVAL HISTORY:  Tracy [name] is here for a follow up of colorectal cancer  She was last seen by NP Lacie on 12/24/2022 She presents to the clinic alone. Pt states everything is  going good, she states she is always SOB. Pt reports of having some cough. Pt denies have stomach issues and she is eating well, she also gained weight. Pt had numbness and tingling in her toes, and was recommend to take Vitamin D.  All other systems were reviewed with the patient and are negative.  MEDICAL HISTORY:  Past Medical History:  Diagnosis Date   Acute deep vein thrombosis (DVT) of brachial vein of right upper extremity (HCC)    Acute on chronic respiratory failure with hypoxia (HCC)    Asthma    Cataract    CHICKENPOX, HX OF 01/06/2011   Qualifier: Diagnosis of  By: Charlett Blake MD, Stacey     Colon cancer [facility])    COPD (chronic obstructive pulmonary disease) ([place]) 2011   FeV1 31% predicted FeV1/FVX 47 %   COVID-19 05/2021   Essential hypertension 05/23/2020   Hemorrhoid    Open right radial fracture 2020   Osteopenia    Perforated sigmoid colon ([place]) 03/03/2020   Pleural effusion    Pneumonia 2021   Postoperative intra-abdominal abscess 2021   Rectal cancer (HCC)    Seasonal allergies    takes Claritin daily prn   Shock circulatory ([place])    Tracheostomy  status ([place])    Vitamin D deficiency    takes Vit d every 14 days    SURGICAL HISTORY: Past Surgical History:  Procedure Laterality Date   AUGMENTATION MAMMAPLASTY     saline   EXAMINATION UNDER ANESTHESIA  10/14/2012   Procedure: EXAM UNDER ANESTHESIA;  Surgeon: Gayland Curry, MD,FACS;  Location: [facility];  Service: General;  Laterality: N/A;  rectal exam under anesthesia excisional hemorrhoidectomy hemorrhoidal banding x two   EXAMINATION UNDER ANESTHESIA  02/03/2013   excision hemorrhoidal tissue   FOOT SURGERY     left bunionectomy   HEMORRHOIDECTOMY WITH HEMORRHOID BANDING  10/14/2012   Procedure: HEMORRHOIDECTOMY WITH HEMORRHOID BANDING;  Surgeon: Gayland Curry, MD,FACS;  Location: [facility];  Service: General;  Laterality: N/A;   IR IMAGING GUIDED PORT INSERTION  02/03/2021   IR THORACENTESIS ASP PLEURAL SPACE W/IMG  GUIDE  04/23/2020   LAPAROTOMY N/A 03/03/2020   Procedure: low anterior resection end colostomy;  Surgeon: [name], MD;  Location: [facility];  Service: General;  Laterality: N/A;   LAPAROTOMY N/A 03/12/2020   Procedure: EXPLORATORY LAPAROTOMY WITH BOWEL RESECTION AND COLOSTOMY;  Surgeon: [name], MD;  Location: [facility];  Service: General;  Laterality: N/A;   OPEN REDUCTION INTERNAL FIXATION (ORIF) DISTAL RADIAL FRACTURE Right 11/01/2018   Procedure: OPEN REDUCTION INTERNAL FIXATION (ORIF) DISTAL RADIAL FRACTURE;  Surgeon: [name], MD;  Location: [facility];  Service: Orthopedics;  Laterality: Right;   TONSILLECTOMY  1960   recurrent otitis media   US ECHOCARDIOGRAPHY  02/2020    poor windows, normal LV function, severely dilated RV with moderately reduced function, RV volume and pressure overload, mildly dilated RA    I have reviewed the social history and family history with the patient and they are unchanged from previous note.  ALLERGIES:  is allergic to amlodipine and codeine.  MEDICATIONS:  Current Outpatient Medications  Medication Sig Dispense Refill   acetaminophen (TYLENOL) 325 MG tablet Take 2 tablets (650 mg total) by mouth every 6 (six) hours as needed for mild pain. 30 tablet 0   albuterol (PROVENTIL) (2.5 MG/3ML) 0.083% nebulizer solution Take 3 mLs (2.5 mg total) by nebulization every 4 (four) hours as needed for wheezing or shortness of breath. DX: J44.9 360 mL 5   albuterol (VENTOLIN HFA) 108 (90 Base) MCG/ACT inhaler Inhale 2 puffs into the lungs every 6 (six) hours as needed for wheezing or shortness of breath. 8 g 5   ALPRAZolam (XANAX) 0.25 MG tablet Take 1 tablet (0.25 mg total) by mouth at bedtime as needed for anxiety. 30 tablet 0   capecitabine (XELODA) 500 MG tablet Take 3 tablets every 12 hours. Take for 7 days on, 7 days off, repeat every 14 days. 84 tablet 2   docusate sodium (COLACE) 50 MG capsule Take 50 mg by mouth daily as needed  for mild constipation.     fluticasone (FLONASE) 50 MCG/ACT nasal spray SPRAY 2 SPRAYS INTO EACH NOSTRIL EVERY DAY 48 mL 0   Fluticasone-Umeclidin-Vilant (TRELEGY ELLIPTA) 100-62.5-25 MCG/ACT AEPB Inhale 1 puff into the lungs daily. 2 each 0   HYDROcodone bit-homatropine (HYCODAN) 5-1.5 MG/5ML syrup Take 5 mLs by mouth every 6 (six) hours as needed for cough. (Patient not taking: Reported on 11/23/2022) 240 mL 0   lidocaine-prilocaine (EMLA) cream Apply 1 application. topically as needed. 30 g 2   loratadine (CLARITIN) 10 MG tablet Take 1 tablet (10 mg total) by mouth daily. 90 tablet 3   losartan (  COZAAR) 25 MG tablet Take 1 tablet (25 mg total) by mouth daily. 90 tablet 1   ondansetron (ZOFRAN) 8 MG tablet Take 1 tablet (8 mg total) by mouth every 8 (eight) hours as needed for nausea or vomiting. 20 tablet 2   prochlorperazine (COMPAZINE) 10 MG tablet Take 1 tablet (10 mg total) by mouth every 6 (six) hours as needed for nausea or vomiting. 30 tablet 2   rivaroxaban (XARELTO) 20 MG TABS tablet Take 1 tablet (20 mg total) by mouth daily with supper. 90 tablet 1   No current facility-administered medications for this visit.    PHYSICAL EXAMINATION: ECOG PERFORMANCE STATUS: 2 - Symptomatic, <50% confined to bed  Vitals:   01/04/23 0955  BP: (!) 166/88  Pulse: 91  Resp: 20  Temp: (!) 97.5 F (36.4 C)  SpO2: 97%   Wt Readings from Last 3 Encounters:  01/04/23 122 lb 1.6 oz (55.4 kg)  11/23/22 115 lb 1.6 oz (52.2 kg)  10/30/22 120 lb 12.8 oz (54.8 kg)    GENERAL:alert, no distress and comfortable SKIN: skin color, texture, turgor are normal, no rashes or significant lesions EYES: normal, Conjunctiva are pink and non-injected, sclera clear LUNGS:(-)  clear to auscultation and percussion with normal breathing effort HEART: (-) regular rate & rhythm and no murmurs and no lower extremity edema  LABORATORY DATA:  I have reviewed the data as listed    Latest Ref Rng & Units 01/04/2023     9:26 AM 11/23/2022    9:39 AM 10/12/2022    8:34 AM  CBC  WBC 4.0 - 10.5 K/uL 8.0  5.3  6.9   Hemoglobin 12.0 - 15.0 g/dL 12.5  12.8  12.7   Hematocrit 36.0 - 46.0 % 36.9  38.7  37.9   Platelets 150 - 400 K/uL 326  285  262         Latest Ref Rng & Units 11/23/2022    9:39 AM 10/12/2022    8:34 AM 09/22/2022   10:36 AM  CMP  Glucose 70 - 99 mg/dL 97  114  105   BUN 8 - 23 mg/dL '20  19  16   '$ Creatinine 0.44 - 1.00 mg/dL 0.78  0.91  0.85   Sodium 135 - 145 mmol/L 139  137  140   Potassium 3.5 - 5.1 mmol/L 3.7  3.9  3.6   Chloride 98 - 111 mmol/L 106  102  105   CO2 22 - 32 mmol/L '25  26  27   '$ Calcium 8.9 - 10.3 mg/dL 9.3  9.2  9.2   Total Protein 6.5 - 8.1 g/dL 6.7  7.4  7.4   Total Bilirubin 0.3 - 1.2 mg/dL 0.5  0.8  0.5   Alkaline Phos 38 - 126 U/L 109  131  124   AST 15 - 41 U/L '16  23  15   '$ ALT 0 - 44 U/L '8  10  6       '$ RADIOGRAPHIC STUDIES: I have personally reviewed the radiological images as listed and agreed with the findings in the report. No results found.    Orders Placed This Encounter  Procedures   NM PET Image Restag (PS) Skull Base To Thigh    Standing Status:   Future    Standing Expiration Date:   01/04/2024    Order Specific Question:   If indicated for the ordered procedure, I authorize the administration of a radiopharmaceutical per Radiology protocol    Answer:  Yes    Order Specific Question:   Preferred imaging location?    Answer:   [facility] (IN HOUSE-[facility])    Standing Status:   Standing    Number of Occurrences:   20    Standing Expiration Date:   01/05/2024   All questions were answered. The patient knows to call the clinic with any problems, questions or concerns. No barriers to learning was detected. The total time spent in the appointment was 30 minutes.     [name], MD 01/04/2023   Felicity Coyer, CMA, am acting as scribe for [name], MD.   I have reviewed the above documentation for accuracy and completeness,  and I agree with the above.

## 2023-01-01 MED FILL — Dexamethasone Sodium Phosphate Inj 100 MG/10ML: INTRAMUSCULAR | Qty: 1 | Status: AC

## 2023-01-03 NOTE — Assessment & Plan Note (Signed)
[id number] stage III, MMR normal, peritoneal metastasis in 01/2021 -diagnosed 02/2020, s/p emergent surgery for perforation. Path showed invasive adenocarcinoma with perforation, +3/15 LNs, and clear margins. Initial CT scan negative for distant metastasis. She was not a candidate for adjuvant chemo due to very slow recovery from surgery -Guardant Reveal ctDNA were negative (06/06/20, 07/12/20, and 09/10/20). -surveillance colonoscopy by Dr. Havery Moros 01/06/21 showed new adenocarcinoma at appendiceal orifice. Biopsy confirmed adenocarcinoma -01/27/21 PET showed tumor in appendiceal orifice is likely peritoneal metastasis growing into the cecum. Her peritoneal implants are mainly in the pelvis, difficult to biopsy -She began Xeloda on 02/01/21. Cycle 2 was changed to 1 week on/1 week off, and beva was added. She tolerates this well -PS improved and low dose oxaliplatin was added with C4, now on Capeox/beva q3 weeks, with continuing Xeloda 1 week on/1 week off. -Beva was held for PE found on CT 08/2021, she is on xarelto. Resumed beva q3 weeks 12/29/21 -she twisted her ankle on 07/22/22, Oxaliplatin and beva were held and restarted on 9/14. She continued Xeloda as prescribed through recovery.  -restaging CT CAP on 08/20/22 was stable, with no new or progressive findings - restaging CT from 12/23/2022 showed increased liver lesion, concerning for cancer progression. Her CEA has been slowly trending up, also concerning for cancer progression.  -will obtain a PET scan to compare PET from 01/2021 -plan to change her treatment to FOLFIRI and beva

## 2023-01-04 ENCOUNTER — Encounter: Payer: Self-pay | Admitting: Hematology

## 2023-01-04 ENCOUNTER — Inpatient Hospital Stay: Payer: Medicare HMO

## 2023-01-04 ENCOUNTER — Other Ambulatory Visit: Payer: Self-pay

## 2023-01-04 ENCOUNTER — Inpatient Hospital Stay: Payer: Medicare HMO | Admitting: Hematology

## 2023-01-04 VITALS — BP 166/88 | HR 91 | Temp 97.5°F | Resp 20 | Ht 60.0 in | Wt 122.1 lb

## 2023-01-04 DIAGNOSIS — Z5111 Encounter for antineoplastic chemotherapy: Secondary | ICD-10-CM | POA: Diagnosis not present

## 2023-01-04 DIAGNOSIS — Z86711 Personal history of pulmonary embolism: Secondary | ICD-10-CM | POA: Diagnosis not present

## 2023-01-04 DIAGNOSIS — C19 Malignant neoplasm of rectosigmoid junction: Secondary | ICD-10-CM

## 2023-01-04 DIAGNOSIS — C786 Secondary malignant neoplasm of retroperitoneum and peritoneum: Secondary | ICD-10-CM | POA: Diagnosis not present

## 2023-01-04 DIAGNOSIS — Z5112 Encounter for antineoplastic immunotherapy: Secondary | ICD-10-CM | POA: Diagnosis not present

## 2023-01-04 DIAGNOSIS — J4489 Other specified chronic obstructive pulmonary disease: Secondary | ICD-10-CM | POA: Diagnosis not present

## 2023-01-04 DIAGNOSIS — F419 Anxiety disorder, unspecified: Secondary | ICD-10-CM | POA: Diagnosis not present

## 2023-01-04 DIAGNOSIS — Z933 Colostomy status: Secondary | ICD-10-CM | POA: Diagnosis not present

## 2023-01-04 DIAGNOSIS — Z79899 Other long term (current) drug therapy: Secondary | ICD-10-CM | POA: Diagnosis not present

## 2023-01-04 DIAGNOSIS — Z86718 Personal history of other venous thrombosis and embolism: Secondary | ICD-10-CM | POA: Diagnosis not present

## 2023-01-04 DIAGNOSIS — Z7952 Long term (current) use of systemic steroids: Secondary | ICD-10-CM | POA: Diagnosis not present

## 2023-01-04 DIAGNOSIS — R69 Illness, unspecified: Secondary | ICD-10-CM | POA: Diagnosis not present

## 2023-01-04 DIAGNOSIS — C799 Secondary malignant neoplasm of unspecified site: Secondary | ICD-10-CM

## 2023-01-04 DIAGNOSIS — G629 Polyneuropathy, unspecified: Secondary | ICD-10-CM | POA: Diagnosis not present

## 2023-01-04 DIAGNOSIS — C20 Malignant neoplasm of rectum: Secondary | ICD-10-CM | POA: Diagnosis not present

## 2023-01-04 DIAGNOSIS — Z7901 Long term (current) use of anticoagulants: Secondary | ICD-10-CM | POA: Diagnosis not present

## 2023-01-04 LAB — CBC WITH DIFFERENTIAL (CANCER CENTER ONLY)
Abs Immature Granulocytes: 0.02 10*3/uL (ref 0.00–0.07)
Basophils Absolute: 0.1 10*3/uL (ref 0.0–0.1)
Basophils Relative: 1 %
Eosinophils Absolute: 0.3 10*3/uL (ref 0.0–0.5)
Eosinophils Relative: 3 %
HCT: 36.9 % (ref 36.0–46.0)
Hemoglobin: 12.5 g/dL (ref 12.0–15.0)
Immature Granulocytes: 0 %
Lymphocytes Relative: 14 %
Lymphs Abs: 1.1 10*3/uL (ref 0.7–4.0)
MCH: 34 pg (ref 26.0–34.0)
MCHC: 33.9 g/dL (ref 30.0–36.0)
MCV: 100.3 fL — ABNORMAL HIGH (ref 80.0–100.0)
Monocytes Absolute: 0.6 10*3/uL (ref 0.1–1.0)
Monocytes Relative: 7 %
Neutro Abs: 5.9 10*3/uL (ref 1.7–7.7)
Neutrophils Relative %: 75 %
Platelet Count: 326 10*3/uL (ref 150–400)
RBC: 3.68 MIL/uL — ABNORMAL LOW (ref 3.87–5.11)
RDW: 13 % (ref 11.5–15.5)
WBC Count: 8 10*3/uL (ref 4.0–10.5)
nRBC: 0 % (ref 0.0–0.2)

## 2023-01-04 LAB — CMP (CANCER CENTER ONLY)
ALT: 8 U/L (ref 0–44)
AST: 18 U/L (ref 15–41)
Albumin: 3.6 g/dL (ref 3.5–5.0)
Alkaline Phosphatase: 116 U/L (ref 38–126)
Anion gap: 8 (ref 5–15)
BUN: 12 mg/dL (ref 8–23)
CO2: 26 mmol/L (ref 22–32)
Calcium: 9.1 mg/dL (ref 8.9–10.3)
Chloride: 107 mmol/L (ref 98–111)
Creatinine: 0.98 mg/dL (ref 0.44–1.00)
GFR, Estimated: >60 mL/min (ref >60)
Glucose, Bld: 86 mg/dL (ref 70–99)
Potassium: 3.1 mmol/L — ABNORMAL LOW (ref 3.5–5.1)
Sodium: 141 mmol/L (ref 135–145)
Total Bilirubin: 0.4 mg/dL (ref 0.3–1.2)
Total Protein: 6.4 g/dL — ABNORMAL LOW (ref 6.5–8.1)

## 2023-01-04 LAB — TOTAL PROTEIN, URINE DIPSTICK: Protein, ur: 30 mg/dL — AB

## 2023-01-04 LAB — CEA (IN HOUSE-CHCC): CEA (CHCC-In House): 24.98 ng/mL — ABNORMAL HIGH (ref 0.00–5.00)

## 2023-01-04 MED ORDER — PALONOSETRON HCL INJECTION 0.25 MG/5ML
0.2500 mg | Freq: Once | INTRAVENOUS | Status: AC
  Administered 2023-01-04: 0.25 mg via INTRAVENOUS
  Filled 2023-01-04: qty 5

## 2023-01-04 MED ORDER — SODIUM CHLORIDE 0.9 % IV SOLN
10.0000 mg | Freq: Once | INTRAVENOUS | Status: AC
  Administered 2023-01-04: 10 mg via INTRAVENOUS
  Filled 2023-01-04: qty 10

## 2023-01-04 MED ORDER — SODIUM CHLORIDE 0.9 % IV SOLN
7.5000 mg/kg | Freq: Once | INTRAVENOUS | Status: AC
  Administered 2023-01-04: 400 mg via INTRAVENOUS
  Filled 2023-01-04: qty 16

## 2023-01-04 MED ORDER — SODIUM CHLORIDE 0.9 % IV SOLN: Freq: Once | INTRAVENOUS | Status: AC

## 2023-01-04 MED ORDER — LOSARTAN POTASSIUM 25 MG PO TABS: 25.0000 mg | ORAL_TABLET | Freq: Every day | ORAL | 1 refills | Status: DC

## 2023-01-04 MED ORDER — OXALIPLATIN CHEMO INJECTION 100 MG/20ML
40.0000 mg/m2 | Freq: Once | INTRAVENOUS | Status: AC
  Administered 2023-01-04: 60 mg via INTRAVENOUS
  Filled 2023-01-04: qty 12

## 2023-01-04 MED ORDER — SODIUM CHLORIDE 0.9% FLUSH
10.0000 mL | Freq: Once | INTRAVENOUS | Status: AC
  Administered 2023-01-04: 10 mL

## 2023-01-04 MED ORDER — DEXTROSE 5 % IV SOLN: Freq: Once | INTRAVENOUS | Status: AC

## 2023-01-06 ENCOUNTER — Telehealth: Payer: Self-pay

## 2023-01-06 NOTE — Telephone Encounter (Signed)
Called patient and gave her instructions on how to schedule her PET Scan. Gave her Centralized scheduling's number. Told her it needed to be done in the next 2 weeks. Patient voiced understanding.

## 2023-01-21 ENCOUNTER — Ambulatory Visit (HOSPITAL_COMMUNITY)
Admission: RE | Admit: 2023-01-21 | Discharge: 2023-01-21 | Disposition: A | Discharge status: Home / Self Care | Payer: Medicare HMO | Source: Ambulatory Visit | Attending: Hematology | Admitting: Hematology

## 2023-01-21 DIAGNOSIS — C19 Malignant neoplasm of rectosigmoid junction: Secondary | ICD-10-CM | POA: Diagnosis not present

## 2023-01-21 DIAGNOSIS — K829 Disease of gallbladder, unspecified: Secondary | ICD-10-CM | POA: Diagnosis not present

## 2023-01-21 DIAGNOSIS — C189 Malignant neoplasm of colon, unspecified: Secondary | ICD-10-CM | POA: Diagnosis not present

## 2023-01-21 DIAGNOSIS — K769 Liver disease, unspecified: Secondary | ICD-10-CM | POA: Diagnosis not present

## 2023-01-21 DIAGNOSIS — C20 Malignant neoplasm of rectum: Secondary | ICD-10-CM | POA: Diagnosis not present

## 2023-01-21 LAB — GLUCOSE, CAPILLARY: Glucose-Capillary: 90 mg/dL (ref 70–99)

## 2023-01-21 MED ORDER — FLUDEOXYGLUCOSE F - 18 (FDG) INJECTION
6.1000 | Freq: Once | INTRAVENOUS | Status: AC
  Administered 2023-01-21: 5.84 via INTRAVENOUS

## 2023-01-22 MED FILL — Dexamethasone Sodium Phosphate Inj 100 MG/10ML: INTRAMUSCULAR | Qty: 1 | Status: AC

## 2023-01-24 NOTE — Progress Notes (Unsigned)
Patient Care Team: Natalia Leatherwood, DO as PCP - General (Family Medicine) Malachy Mood, MD as Consulting Physician (Hematology) Icard, Rachel Bo, DO as Consulting Physician (Pulmonary Disease) [facility] Luretha Murphy, MD as Consulting Physician (General Surgery) Romie Levee, MD as Consulting Physician (General Surgery) [name], Willaim Rayas, MD as Consulting Physician (Gastroenterology)   CHIEF COMPLAINT: Follow up colorectal cancer   Oncology History Overview Note  Cancer Staging Rectal cancer [facility]) Staging form: Colon and Rectum, AJCC 8th Edition - Pathologic stage from 03/03/2020: Stage IIIB (pT4a, pN1b, cM0) - Signed by Malachy Mood, MD on 01/28/2021 Stage prefix: Initial diagnosis Histologic grading system: 4 grade system Histologic grade (G): G2 Residual tumor (R): R0 - None    Rectosigmoid cancer (HCC)  03/03/2020 - 04/05/2020 Hospital Admission   She was admitted to ED on 03/03/20 for abdominal pain and nausea. She had been having diarrhea for several weeks. During hospital stay she developed acute respiratory failure, AKI, RUE DVT from PICC line. Work up showed bowel perforation, small left liver mass and thickening of jejunum. She underwent emergent surgery on 03/03/20 for resection and colostomy placement. Her path showed invasive cancer, metastatic to 3/15 LNs. She had a NGT in placed but this was removed on POD 3. Post op her stoma became necrotic and septic. She had another bowel surgery on 03/12/20, which was NED with necrotic tissue.    03/03/2020 Imaging   CT AP 03/03/20  IMPRESSION: 1. Free intraperitoneal air consistent perforation. Most likely site of perforation is in the distal splenic flexure/proximal descending colon where there is a collection of air and gas measuring 6 centimeters. No obvious soft tissue mass identified in this region. At this site, there is abrupt transition of dilated, stool-filled colon to completely decompressed proximal  descending colon. 2. Thickened, inflamed loops of jejunum are identified within the pelvis and are likely reactive. 3. Small hiatal hernia. 4. Benign-appearing 1.6 centimeter mass the LEFT hepatic lobe. Recommend comparison with prior studies if available. 5.  Emphysema (ICD10-J43.9). 6.  Aortic Atherosclerosis (ICD10-I70.0). 7. Bilateral renal scarring.   03/03/2020 Surgery   low anterior resection end colostomy by Dr Maisie Fus    03/03/2020 Initial Biopsy   FINAL MICROSCOPIC DIAGNOSIS: 03/03/20 A. RECTOSIGMOID COLON, LOW ANTERIOR RESECTION:  - Invasive colonic adenocarcinoma, 3.5 cm.  - Tumor invades the visceral peritoneum.  - Margins of resection are not involved.  - Metastatic carcinoma in (3) of (15) lymph nodes.  - See oncology table.   B. ADDITIONAL SIGMOID COLON, RESECTION:  - Colonic tissue, negative for carcinoma.  ADDENDUM:  Mismatch Repair Protein (IHC)   SUMMARY INTERPRETATION: NORMAL  There is preserved expression of the major MMR proteins. There is a very  low probability that microsatellite instability (MSI) is present.  However, certain clinically significant MMR protein mutations may result  in preservation of nuclear expression. It is recommended that the  preservation of protein expression be correlated with molecular based  MSI testing.   IHC EXPRESSION RESULTS  TEST           RESULT  MLH1:          Preserved nuclear expression  MSH2:          Preserved nuclear expression  MSH6:          Preserved nuclear expression  PMS2:          Preserved nuclear expression   03/03/2020 Cancer Staging   Staging form: Colon and Rectum, AJCC 8th Edition -  Pathologic stage from 03/03/2020: Stage IIIB (pT4a, pN1b, cM0) - Signed by Malachy Mood, MD on 01/28/2021 Stage prefix: Initial diagnosis Histologic grading system: 4 grade system Histologic grade (G): G2 Residual tumor (R): R0 - None   03/08/2020 Imaging   CT AP 03/08/20 IMPRESSION: 1. Interval midline laparotomy with  distal colon resection and diverting left lower quadrant colostomy. 2. Diffuse small bowel dilatation with gas fluid levels most consistent with postoperative ileus. 3. Trace ascites within the abdomen and pelvis. No fluid collection or abscess at this time. Surgical drain within the lower pelvis. 4. Indeterminate 1.4 cm subcapsular liver hypodensity. In light of newly diagnosed rectal cancer, metastatic disease cannot be excluded. PET CT may be useful for further evaluation. 5. Interval development of trace bilateral pleural effusions and diffuse body wall edema.   03/12/2020 Surgery   EXPLORATORY LAPAROTOMY WITH BOWEL RESECTION AND COLOSTOMY by Dr Daphine Deutscher 03/12/20  FINAL MICROSCOPIC DIAGNOSIS: 03/12/20  A. COLON, SPLENIC FLEXURE, RESECTION:  - Segment of colon (37 cm) with perforation and associated inflammation  - Multiple mucosal ulcers with necrotizing inflammation  - No evidence of malignancy    03/12/2020 Imaging   CT AP WO contrast 03/12/20  IMPRESSION: 1. Exam is limited by lack of intravenous contrast, motion, and artifact from the patient's arms adjacent to the torso. Since 03/08/2020, there has been development of relatively large pockets of intraperitoneal free air. While intraperitoneal gas would not be unexpected on postoperative day 9, this gas is new since an intervening study of 03/08/2020 and given the relatively large volume certainly raises concern for bowel perforation. No source for the intraperitoneal free air is evident on this study. There is a surgical drain in the pelvis, but there is no free gas around the drain itself to suggest that it represents the source. 2. New circumferential wall thickening in the splenic flexure, descending colon and sigmoid colon leading into the end colostomy. Infection/inflammation would be a consideration. Ischemia cannot be excluded. 3. Relatively small volume intraperitoneal free fluid. High attenuation small fluid  collections in the left upper abdomen may reflect hemorrhage, infection or residua from prior perforation. 4. Interval progression of diffuse body wall edema. 5. Residual contrast material in the renal parenchyma from prior imaging, compatible with renal dysfunction.    03/19/2020 Imaging   CT CAP 03/19/20  IMPRESSION: 1. 5.5 x 3.2 cm fluid collection is noted along the greater curvature of the proximal stomach. 2. Surgical drain is again noted in the pelvis with tip in left lower quadrant. 3. Interval development of crescent-shaped fluid collection measuring 16.4 x 3.3 cm in the epigastric region and left upper quadrant of the abdomen which may extend into the left lower quadrant. Potentially this may represent abscess or developing abscess. Multiple other smaller fluid collections are noted which may represent small abscesses. 4. 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 5. Mild amount of free fluid is noted in the posterior pelvis. 6. Moderate anasarca is noted. Aortic Atherosclerosis (ICD10-I70.0).   04/12/2020 Imaging   CT AP 04/12/20  IMPRESSION: 1. Fluid collections within the LEFT abdomen have significantly decreased compared to previous CT exams, now nearly completely resolved. 2. Percutaneous drainage catheter with tip coiled posterior to the LEFT kidney, stable positioning compared to the previous study. The more anterior catheter has been removed. 3. Small amount of free fluid persists within the abdomen and pelvis. 4. Trace bibasilar pleural effusions. 5. Anasarca. 6. While reviewing today's study, comparing with a chest/abdomen/pelvis CT from earlier same  month, there is question of thrombus in the LEFT internal jugular vein. Recommend ultrasound of the LEFT IJ to exclude DVT. This recommendation discussed with patient's hospitalist, Dr. Manson Passey, on 04/12/2020 at 4:20 p.m.   Emphysema (ICD10-J43.9).   04/22/2020 Imaging   CT AP 04/22/20  IMPRESSION: 1. No  recurrent intra-abdominal abscess status post interval removal of left retroperitoneal percutaneous drain. Stable small amount of free pelvic fluid. 2. Enlarging dependent bilateral pleural effusions, now moderate in volume. Associated atelectasis at both lung bases. 3. Progressive anasarca with generalized edema throughout the subcutaneous fat. 4. Stable small subcapsular fluid collection along the anterior aspect of the left hepatic lobe. 5. Aortic Atherosclerosis (ICD10-I70.0).   05/23/2020 Initial Diagnosis   Rectal cancer (HCC)   01/14/2021 Imaging   CT CAP IMPRESSION: -5.0 cm soft tissue mass along the posterior aspect of the cecum at the base of the appendix, favored to reflect a peritoneal/serosal implant, corresponding to the patient's known adenocarcinoma. This is new from the prior. -Suspected additional pelvic implants along the uterus and right adnexa, poorly visualized, new/progressive from the prior. -Additional pericapsular lesion along the lateral spleen is mildly progressive, suspicious for additional peritoneal implant. -No evidence of metastatic disease in the chest.   01/27/2021 PET scan   IMPRESSION:  1. Extensive hypermetabolic peritoneal metastasis, as detailed  above.  2. No evidence of hypermetabolic supradiaphragmatic disease.  3. Diffuse low-level thyroid hypermetabolism can be seen in the  setting of thyroiditis. Consider correlation with thyroid function  labs.  4. Aortic atherosclerosis (ICD10-I70.0) and emphysema (ICD10-J43.9).    02/01/2021 -  Chemotherapy    First-line chemo Xeloda 1500 mg twice daily for day 1-14, every 21 days, started on 02/01/2021. Reduced to 1 week on/ 1 week off and added bevacizumab q2weeks from C2 on 02/24/21.  ----Given good tolerance, I changed to CAPOX and bevacizumab q2weeks from C4 on 03/24/21 with Xeloda 1500mg  in the AM and 1000mg  in the PM 1 week on/1 week off.     03/24/2021 -  Chemotherapy   Patient is on Treatment  Plan : COLORECTAL CapeOx + Bevacizumab q21d     05/08/2021 Imaging   PET  IMPRESSION: 1. There is been interval development of several FDG avid pulmonary nodules within both lungs. Additionally, there is new FDG avid low left paratracheal lymph node. Thoracic metastasis cannot be excluded. 2. Interval improvement in multifocal FDG avid peritoneal metastasis as detailed above.     08/14/2021 Imaging   CT CAP  IMPRESSION: Resolution of previously seen small bilateral pulmonary nodules since previous study.   Stable 8 mm mediastinal lymph node in the AP window.   No evidence of metastatic disease within the abdomen or pelvis.   Nonocclusive pulmonary embolism in bilateral central lower lobe pulmonary arteries, which appears subacute in age.   12/25/2021 Imaging   EXAM: CT CHEST, ABDOMEN, AND PELVIS WITH CONTRAST  IMPRESSION: 1. Minimal, bandlike residua of previously seen FDG avid pulmonary nodules, consistent with treated metastases. No new pulmonary nodules. 2. Unchanged post treatment appearance of the cecal base, with soft tissue thickening about the cecal base and adjacent pelvis, previously FDG avid and in keeping with known appendiceal orifice malignancy. 3. Unchanged central fluid attenuation lesions about the spleen, left iliopsoas, and left pelvic sidewall, previously FDG avid, consistent with treated peritoneal implants. 4. No evidence of new metastatic disease in the chest, abdomen, or pelvis 5. Status post rectosigmoid colon resection with left lower quadrant end colostomy.   Aortic Atherosclerosis (ICD10-I70.0).  Malignant neoplasm of appendix (HCC)  01/06/2021 Procedure   (surveillance colonoscopy for h/o rectal cancer) Impression:  - Preparation of the colon was fair, lavage performed with mostly adequate views. - Polypoid lesion obliterating appendiceal orifice. Biopsied to evaluate for malignancy. - The examined portion of the ileum was normal. - Two  large polyps in the cecum, not removed today pending path at appendiceal orifice, bowel prep. If removed endoscopically in the future would favor EMR to be done at the hospital. - One 5 mm polyp at the ileocecal valve, removed with a cold snare. Resected and retrieved. - One 3 mm polyp in the transverse colon, removed with a cold snare. Resected and retrieved. - Parastomal hernia making initial entry to the distal colon challenging. - The examination was otherwise normal.   01/06/2021 Initial Biopsy   Diagnosis 1. Colon, biopsy, appendiceal oraface - ADENOCARCINOMA. 2. Colon, polyp(s), ileocecal valve, transverse, x2 - TUBULAR ADENOMA, NEGATIVE FOR HIGH GRADE DYSPLASIA (X1). - COLONIC MUCOSA WITH UNDERLYING LYMPHOID AGGREGATE, NEGATIVE FOR DYSPLASIA (X1).  MMR normal   01/06/2021 Initial Diagnosis   Malignant neoplasm of appendix (HCC)   01/06/2021 Cancer Staging   Staging form: Appendix - Carcinoma, AJCC 8th Edition - Clinical stage from 01/06/2021: Stage IVC (cTX, cNX, cM1c) - Signed by Malachy Mood, MD on 12/29/2021   01/14/2021 Imaging   CT CAP IMPRESSION: -5.0 cm soft tissue mass along the posterior aspect of the cecum at the base of the appendix, favored to reflect a peritoneal/serosal implant, corresponding to the patient's known adenocarcinoma. This is new from the prior. -Suspected additional pelvic implants along the uterus and right adnexa, poorly visualized, new/progressive from the prior. -Additional pericapsular lesion along the lateral spleen is mildly progressive, suspicious for additional peritoneal implant. -No evidence of metastatic disease in the chest.   08/14/2021 Imaging   CT CAP IMPRESSION: Resolution of previously seen small bilateral pulmonary nodules since previous study. Stable 8 mm mediastinal lymph node in the AP window. No evidence of metastatic disease within the abdomen or pelvis. Nonocclusive pulmonary embolism in bilateral central lower lobe pulmonary  arteries, which appears subacute in age.  Aortic Atherosclerosis (ICD10-I70.0) and Emphysema (ICD10-J43.9).     08/14/2021 Imaging   CT CAP  IMPRESSION: Resolution of previously seen small bilateral pulmonary nodules since previous study.   Stable 8 mm mediastinal lymph node in the AP window.   No evidence of metastatic disease within the abdomen or pelvis.   Nonocclusive pulmonary embolism in bilateral central lower lobe pulmonary arteries, which appears subacute in age.   12/25/2021 Imaging   EXAM: CT CHEST, ABDOMEN, AND PELVIS WITH CONTRAST  IMPRESSION: 1. Minimal, bandlike residua of previously seen FDG avid pulmonary nodules, consistent with treated metastases. No new pulmonary nodules. 2. Unchanged post treatment appearance of the cecal base, with soft tissue thickening about the cecal base and adjacent pelvis, previously FDG avid and in keeping with known appendiceal orifice malignancy. 3. Unchanged central fluid attenuation lesions about the spleen, left iliopsoas, and left pelvic sidewall, previously FDG avid, consistent with treated peritoneal implants. 4. No evidence of new metastatic disease in the chest, abdomen, or pelvis 5. Status post rectosigmoid colon resection with left lower quadrant end colostomy.   Aortic Atherosclerosis (ICD10-I70.0).      CURRENT THERAPY: CapeOx with Xeloda 1500 mg twice daily for 7 days on/7 days off, dose-reduced oxaliplatin, and Beva every 3 weeks    INTERVAL HISTORY Ms. Sehnert returns for follow up and treatment as scheduled. Last seen by  Dr. Mosetta Putt 01/04/23 and completed another cycle of CapeOx. She underwent PET scan 01/21/23  ROS   Past Medical History:  Diagnosis Date   Acute deep vein thrombosis (DVT) of brachial vein of right upper extremity (HCC)    Acute on chronic respiratory failure with hypoxia (HCC)    Asthma    Cataract    CHICKENPOX, HX OF 01/06/2011   Qualifier: Diagnosis of  By: Abner Greenspan MD, Stacey     Colon  cancer [facility])    COPD (chronic obstructive pulmonary disease) (HCC) 2011   FeV1 31% predicted FeV1/FVX 47 %   COVID-19 05/2021   Essential hypertension 05/23/2020   Hemorrhoid    Open right radial fracture 2020   Osteopenia    Perforated sigmoid colon (HCC) 03/03/2020   Pleural effusion    Pneumonia 2021   Postoperative intra-abdominal abscess 2021   Rectal cancer (HCC)    Seasonal allergies    takes Claritin daily prn   Shock circulatory (HCC)    Tracheostomy status (HCC)    Vitamin D deficiency    takes Vit d every 14 days     Past Surgical History:  Procedure Laterality Date   AUGMENTATION MAMMAPLASTY     saline   EXAMINATION UNDER ANESTHESIA  10/14/2012   Procedure: EXAM UNDER ANESTHESIA;  Surgeon: Atilano Ina, MD,FACS;  Location: [facility] OR;  Service: General;  Laterality: N/A;  rectal exam under anesthesia excisional hemorrhoidectomy hemorrhoidal banding x two   EXAMINATION UNDER ANESTHESIA  02/03/2013   excision hemorrhoidal tissue   FOOT SURGERY     left bunionectomy   HEMORRHOIDECTOMY WITH HEMORRHOID BANDING  10/14/2012   Procedure: HEMORRHOIDECTOMY WITH HEMORRHOID BANDING;  Surgeon: Atilano Ina, MD,FACS;  Location: [facility] OR;  Service: General;  Laterality: N/A;   IR IMAGING GUIDED PORT INSERTION  02/03/2021   IR THORACENTESIS ASP PLEURAL SPACE W/IMG GUIDE  04/23/2020   LAPAROTOMY N/A 03/03/2020   Procedure: low anterior resection end colostomy;  Surgeon: Romie Levee, MD;  Location: [facility] ORS;  Service: General;  Laterality: N/A;   LAPAROTOMY N/A 03/12/2020   Procedure: EXPLORATORY LAPAROTOMY WITH BOWEL RESECTION AND COLOSTOMY;  Surgeon: Luretha Murphy, MD;  Location: [facility] ORS;  Service: General;  Laterality: N/A;   OPEN REDUCTION INTERNAL FIXATION (ORIF) DISTAL RADIAL FRACTURE Right 11/01/2018   Procedure: OPEN REDUCTION INTERNAL FIXATION (ORIF) DISTAL RADIAL FRACTURE;  Surgeon: Betha Loa, MD;  Location: [facility];  Service: Orthopedics;  Laterality: Right;    TONSILLECTOMY  1960   recurrent otitis media   US ECHOCARDIOGRAPHY  02/2020    poor windows, normal LV function, severely dilated RV with moderately reduced function, RV volume and pressure overload, mildly dilated RA     Outpatient Encounter Medications as of 01/25/2023  Medication Sig   acetaminophen (TYLENOL) 325 MG tablet Take 2 tablets (650 mg total) by mouth every 6 (six) hours as needed for mild pain.   albuterol (PROVENTIL) (2.5 MG/3ML) 0.083% nebulizer solution Take 3 mLs (2.5 mg total) by nebulization every 4 (four) hours as needed for wheezing or shortness of breath. DX: J44.9   albuterol (VENTOLIN HFA) 108 (90 Base) MCG/ACT inhaler Inhale 2 puffs into the lungs every 6 (six) hours as needed for wheezing or shortness of breath.   ALPRAZolam (XANAX) 0.25 MG tablet Take 1 tablet (0.25 mg total) by mouth at bedtime as needed for anxiety.   capecitabine (XELODA) 500 MG tablet Take 3 tablets every 12 hours. Take for 7 days on, 7 days off, repeat  every 14 days.   docusate sodium (COLACE) 50 MG capsule Take 50 mg by mouth daily as needed for mild constipation.   fluticasone (FLONASE) 50 MCG/ACT nasal spray SPRAY 2 SPRAYS INTO EACH NOSTRIL EVERY DAY   Fluticasone-Umeclidin-Vilant (TRELEGY ELLIPTA) 100-62.5-25 MCG/ACT AEPB Inhale 1 puff into the lungs daily.   HYDROcodone bit-homatropine (HYCODAN) 5-1.5 MG/5ML syrup Take 5 mLs by mouth every 6 (six) hours as needed for cough. (Patient not taking: Reported on 11/23/2022)   lidocaine-prilocaine (EMLA) cream Apply 1 application. topically as needed.   loratadine (CLARITIN) 10 MG tablet Take 1 tablet (10 mg total) by mouth daily.   losartan (COZAAR) 25 MG tablet Take 1 tablet (25 mg total) by mouth daily.   ondansetron (ZOFRAN) 8 MG tablet Take 1 tablet (8 mg total) by mouth every 8 (eight) hours as needed for nausea or vomiting.   prochlorperazine (COMPAZINE) 10 MG tablet Take 1 tablet (10 mg total) by mouth every 6 (six) hours as needed for  nausea or vomiting.   rivaroxaban (XARELTO) 20 MG TABS tablet Take 1 tablet (20 mg total) by mouth daily with supper.   No facility-administered encounter medications on file as of 01/25/2023.     There were no vitals filed for this visit. There is no height or weight on file to calculate BMI.   PHYSICAL EXAM GENERAL:alert, no distress and comfortable SKIN: no rash  EYES: sclera clear NECK: without mass LYMPH:  no palpable cervical or supraclavicular lymphadenopathy  LUNGS: clear with normal breathing effort HEART: regular rate & rhythm, no lower extremity edema ABDOMEN: abdomen soft, non-tender and normal bowel sounds NEURO: alert & oriented x 3 with fluent speech, no focal motor/sensory deficits Breast exam:  PAC without erythema    CBC    Component Value Date/Time   WBC 8.0 01/04/2023 0926   WBC 7.3 07/13/2022 1007   RBC 3.68 (L) 01/04/2023 0926   HGB 12.5 01/04/2023 0926   HCT 36.9 01/04/2023 0926   PLT 326 01/04/2023 0926   MCV 100.3 (H) 01/04/2023 0926   MCH 34.0 01/04/2023 0926   MCHC 33.9 01/04/2023 0926   RDW 13.0 01/04/2023 0926   LYMPHSABS 1.1 01/04/2023 0926   MONOABS 0.6 01/04/2023 0926   EOSABS 0.3 01/04/2023 0926   BASOSABS 0.1 01/04/2023 0926     CMP     Component Value Date/Time   NA 141 01/04/2023 0926   K 3.1 (L) 01/04/2023 0926   CL 107 01/04/2023 0926   CO2 26 01/04/2023 0926   GLUCOSE 86 01/04/2023 0926   BUN 12 01/04/2023 0926   CREATININE 0.98 01/04/2023 0926   CALCIUM 9.1 01/04/2023 0926   PROT 6.4 (L) 01/04/2023 0926   ALBUMIN 3.6 01/04/2023 0926   AST 18 01/04/2023 0926   ALT 8 01/04/2023 0926   ALKPHOS 116 01/04/2023 0926   BILITOT 0.4 01/04/2023 0926   GFRNONAA >60 01/04/2023 0926   GFRAA >60 08/27/2020 0857     ASSESSMENT & PLAN:  PLAN:  No orders of the defined types were placed in this encounter.     All questions were answered. The patient knows to call the clinic with any problems, questions or concerns. No  barriers to learning were detected. I spent *** counseling the patient face to face. The total time spent in the appointment was *** and more than 50% was on counseling, review of test results, and coordination of care.   Santiago Glad, NP-C @DATE @

## 2023-01-25 ENCOUNTER — Inpatient Hospital Stay: Payer: Medicare HMO

## 2023-01-25 ENCOUNTER — Inpatient Hospital Stay: Payer: Medicare HMO | Attending: Hematology | Admitting: Hematology

## 2023-01-25 DIAGNOSIS — Z5112 Encounter for antineoplastic immunotherapy: Secondary | ICD-10-CM | POA: Insufficient documentation

## 2023-01-25 DIAGNOSIS — C19 Malignant neoplasm of rectosigmoid junction: Secondary | ICD-10-CM | POA: Diagnosis not present

## 2023-01-25 DIAGNOSIS — C786 Secondary malignant neoplasm of retroperitoneum and peritoneum: Secondary | ICD-10-CM | POA: Insufficient documentation

## 2023-01-25 DIAGNOSIS — Z79899 Other long term (current) drug therapy: Secondary | ICD-10-CM | POA: Insufficient documentation

## 2023-01-25 DIAGNOSIS — Z5111 Encounter for antineoplastic chemotherapy: Secondary | ICD-10-CM | POA: Insufficient documentation

## 2023-01-25 DIAGNOSIS — Z933 Colostomy status: Secondary | ICD-10-CM | POA: Insufficient documentation

## 2023-01-25 DIAGNOSIS — Z86711 Personal history of pulmonary embolism: Secondary | ICD-10-CM | POA: Insufficient documentation

## 2023-01-25 DIAGNOSIS — Z86718 Personal history of other venous thrombosis and embolism: Secondary | ICD-10-CM | POA: Insufficient documentation

## 2023-01-25 DIAGNOSIS — Z7901 Long term (current) use of anticoagulants: Secondary | ICD-10-CM | POA: Insufficient documentation

## 2023-01-25 DIAGNOSIS — C20 Malignant neoplasm of rectum: Secondary | ICD-10-CM | POA: Insufficient documentation

## 2023-01-25 NOTE — Progress Notes (Unsigned)
[place]   Telephone:([phone]) [phone] Fax:(336) [phone]   Clinic Follow up Note   Patient Care Team: [name], DO as PCP - General (Family Medicine) [name], MD as Consulting Physician (Hematology) Icard, Octavio Graves, DO as Consulting Physician (Pulmonary Disease) [facility] [name], MD as Consulting Physician (General Surgery) [name], MD as Consulting Physician (General Surgery) [name], [name], MD as Consulting Physician (Gastroenterology)  Date of Service:  01/25/2023  I connected with Tracy [name] on 01/25/2023 at  3:45 PM EST by telephone visit and verified that I am speaking with the correct person using two identifiers.  I discussed the limitations, risks, security and privacy concerns of performing an evaluation and management service by telephone and the availability of in person appointments. I also discussed with the patient that there may be a patient responsible charge related to this service. The patient expressed understanding and agreed to proceed.   Other persons participating in the visit and their role in the encounter:  Husband  Patient's location:  Home Provider's location:  Office  CHIEF COMPLAINT: f/u of  colorectal cancer    CURRENT THERAPY:   FOLFIRI+BEVAACIZUMAB  start 02/01/2023 ASSESSMENT & PLAN:  Tracy [name] is a 72 y.o. 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 Path showed invasive adenocarcinoma with perforation, +3/15 LNs, and clear margins. Initial CT scan negative for distant metastasis. She was not a candidate for adjuvant chemo due to very slow recovery from surgery -Guardant Reveal ctDNA were negative (06/06/20, 07/12/20, and 09/10/20). -surveillance colonoscopy by Dr. Havery Moros 01/06/21 showed new adenocarcinoma at appendiceal orifice. Biopsy confirmed  adenocarcinoma -01/27/21 PET showed tumor in appendiceal orifice is likely peritoneal metastasis growing into the cecum. Her peritoneal implants are mainly in the pelvis, difficult to biopsy -She began Xeloda on 02/01/21. Cycle 2 was changed to 1 week on/1 week off, and beva was added. She tolerates this well -PS improved and low dose oxaliplatin was added with C4, now on Capeox/beva q3 weeks, with continuing Xeloda 1 week on/1 week off. -Beva was held for PE found on CT 08/2021, she is on xarelto. Resumed beva q3 weeks 12/29/21 -she twisted her ankle on 07/22/22, Oxaliplatin and beva were held and restarted on 9/14. She continued Xeloda as prescribed through recovery.  -I personally reviewed her restaging PET scan from January 21, 2023, which unfortunately confirmed cancer progression, especially in liver and the pelvis.  I discussed the findings with patient and her husband. -plan to change her treatment to FOLFIRI and beva  --Chemotherapy consent: Side effects including but does not not limited to, fatigue, nausea, vomiting, diarrhea, hair loss, neuropathy, fluid retention, renal and kidney dysfunction, neutropenic fever, needed for blood transfusion, bleeding, were discussed with patient in great detail. She agrees to proceed. -The goal of chemotherapy is palliative, for disease control and prolong her life.   PLAN: -PET scan reviewed  -will change chemo to FOLFIRI and beva, plan to start     PLAN: - DIscuss PET scan - progression -Discuss FOLFIRI- Irinotecan and 5FU and its side effects -iIrinotecan and 5FU to start next week   SUMMARY OF ONCOLOGIC HISTORY: Oncology History Overview Note  Cancer Staging Rectal cancer [facility]) Staging form: Colon and Rectum, AJCC 8th Edition - Pathologic stage from 03/03/2020: Stage IIIB (pT4a, pN1b, cM0) - Signed by [name], MD on 01/28/2021 Stage prefix: Initial diagnosis Histologic  grading system: 4 grade system Histologic grade (G): G2 Residual tumor  (R): R0 - None    Rectosigmoid cancer ([place])  03/03/2020 - 04/05/2020 Hospital Admission   She was admitted to ED on 03/03/20 for abdominal pain and nausea. She had been having diarrhea for several weeks. During hospital stay she developed acute respiratory failure, AKI, RUE DVT from PICC line. Work up showed bowel perforation, small left liver mass and thickening of jejunum. She underwent emergent surgery on 03/03/20 for resection and colostomy placement. Her path showed invasive cancer, metastatic to 3/15 LNs. She had a NGT in placed but this was removed on POD 3. Post op her stoma became necrotic and septic. She had another bowel surgery on 03/12/20, which was NED with necrotic tissue.    03/03/2020 Imaging   CT AP 03/03/20  IMPRESSION: 1. Free intraperitoneal air consistent perforation. Most likely site of perforation is in the distal splenic flexure/proximal descending colon where there is a collection of air and gas measuring 6 centimeters. No obvious soft tissue mass identified in this region. At this site, there is abrupt transition of dilated, stool-filled colon to completely decompressed proximal descending colon. 2. Thickened, inflamed loops of jejunum are identified within the pelvis and are likely reactive. 3. Small hiatal hernia. 4. Benign-appearing 1.6 centimeter mass the LEFT hepatic lobe. Recommend comparison with prior studies if available. 5.  Emphysema (ICD10-J43.9). 6.  Aortic Atherosclerosis (ICD10-I70.0). 7. Bilateral renal scarring.   03/03/2020 Surgery   low anterior resection end colostomy by Dr Marcello Moores    03/03/2020 Initial Biopsy   FINAL MICROSCOPIC DIAGNOSIS: 03/03/20 A. RECTOSIGMOID COLON, LOW ANTERIOR RESECTION:  - Invasive colonic adenocarcinoma, 3.5 cm.  - Tumor invades the visceral peritoneum.  - Margins of resection are not involved.  - Metastatic carcinoma in (3) of (15) lymph nodes.  - See oncology table.   B. ADDITIONAL SIGMOID COLON, RESECTION:  -  Colonic tissue, negative for carcinoma.  ADDENDUM:  Mismatch Repair Protein (IHC)   SUMMARY INTERPRETATION: NORMAL  There is preserved expression of the major MMR proteins. There is a very  low probability that microsatellite instability (MSI) is present.  However, certain clinically significant MMR protein mutations may result  in preservation of nuclear expression. It is recommended that the  preservation of protein expression be correlated with molecular based  MSI testing.   IHC EXPRESSION RESULTS  TEST           RESULT  MLH1:          Preserved nuclear expression  MSH2:          Preserved nuclear expression  MSH6:          Preserved nuclear expression  PMS2:          Preserved nuclear expression   03/03/2020 Cancer Staging   Staging form: Colon and Rectum, AJCC 8th Edition - Pathologic stage from 03/03/2020: Stage IIIB (pT4a, pN1b, cM0) - Signed by [name], MD on 01/28/2021 Stage prefix: Initial diagnosis Histologic grading system: 4 grade system Histologic grade (G): G2 Residual tumor (R): R0 - None   03/08/2020 Imaging   CT AP 03/08/20 IMPRESSION: 1. Interval midline laparotomy with distal colon resection and diverting left lower quadrant colostomy. 2. Diffuse small bowel dilatation with gas fluid levels most consistent with postoperative ileus. 3. Trace ascites within the abdomen and pelvis. No fluid collection or abscess at this time. Surgical drain within the lower pelvis. 4. Indeterminate 1.4 cm subcapsular liver hypodensity. In light of newly diagnosed  rectal cancer, metastatic disease cannot be excluded. PET CT may be useful for further evaluation. 5. Interval development of trace bilateral pleural effusions and diffuse body wall edema.   03/12/2020 Surgery   EXPLORATORY LAPAROTOMY WITH BOWEL RESECTION AND COLOSTOMY by Dr Hassell Done 03/12/20  FINAL MICROSCOPIC DIAGNOSIS: 03/12/20  A. COLON, SPLENIC FLEXURE, RESECTION:  - Segment of colon (37 cm) with perforation  and associated inflammation  - Multiple mucosal ulcers with necrotizing inflammation  - No evidence of malignancy    03/12/2020 Imaging   CT AP WO contrast 03/12/20  IMPRESSION: 1. Exam is limited by lack of intravenous contrast, motion, and artifact from the patient's arms adjacent to the torso. Since 03/08/2020, there has been development of relatively large pockets of intraperitoneal free air. While intraperitoneal gas would not be unexpected on postoperative day 9, this gas is new since an intervening study of 03/08/2020 and given the relatively large volume certainly raises concern for bowel perforation. No source for the intraperitoneal free air is evident on this study. There is a surgical drain in the pelvis, but there is no free gas around the drain itself to suggest that it represents the source. 2. New circumferential wall thickening in the splenic flexure, descending colon and sigmoid colon leading into the end colostomy. Infection/inflammation would be a consideration. Ischemia cannot be excluded. 3. Relatively small volume intraperitoneal free fluid. High attenuation small fluid collections in the left upper abdomen may reflect hemorrhage, infection or residua from prior perforation. 4. Interval progression of diffuse body wall edema. 5. Residual contrast material in the renal parenchyma from prior imaging, compatible with renal dysfunction.    03/19/2020 Imaging   CT CAP 03/19/20  IMPRESSION: 1. 5.5 x 3.2 cm fluid collection is noted along the greater curvature of the proximal stomach. 2. Surgical drain is again noted in the pelvis with tip in left lower quadrant. 3. Interval development of crescent-shaped fluid collection measuring 16.4 x 3.3 cm in the epigastric region and left upper quadrant of the abdomen which may extend into the left lower quadrant. Potentially this may represent abscess or developing abscess. Multiple other smaller fluid collections are noted  which may represent small abscesses. 4. 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 5. Mild amount of free fluid is noted in the posterior pelvis. 6. Moderate anasarca is noted. Aortic Atherosclerosis (ICD10-I70.0).   04/12/2020 Imaging   CT AP 04/12/20  IMPRESSION: 1. Fluid collections within the LEFT abdomen have significantly decreased compared to previous CT exams, now nearly completely resolved. 2. Percutaneous drainage catheter with tip coiled posterior to the LEFT kidney, stable positioning compared to the previous study. The more anterior catheter has been removed. 3. Small amount of free fluid persists within the abdomen and pelvis. 4. Trace bibasilar pleural effusions. 5. Anasarca. 6. While reviewing today's study, comparing with a chest/abdomen/pelvis CT from earlier same month, there is question of thrombus in the LEFT internal jugular vein. Recommend ultrasound of the LEFT IJ to exclude DVT. This recommendation discussed with patient's hospitalist, Dr. Owens Shark, on 04/12/2020 at 4:20 p.m.   Emphysema (ICD10-J43.9).   04/22/2020 Imaging   CT AP 04/22/20  IMPRESSION: 1. No recurrent intra-abdominal abscess status post interval removal of left retroperitoneal percutaneous drain. Stable small amount of free pelvic fluid. 2. Enlarging dependent bilateral pleural effusions, now moderate in volume. Associated atelectasis at both lung bases. 3. Progressive anasarca with generalized edema throughout the subcutaneous fat. 4. Stable small subcapsular fluid collection along the anterior aspect of the left  hepatic lobe. 5. Aortic Atherosclerosis (ICD10-I70.0).   05/23/2020 Initial Diagnosis   Rectal cancer ([place])   01/14/2021 Imaging   CT CAP IMPRESSION: -5.0 cm soft tissue mass along the posterior aspect of the cecum at the base of the appendix, favored to reflect a peritoneal/serosal implant, corresponding to the patient's known adenocarcinoma. This is new from the  prior. -Suspected additional pelvic implants along the uterus and right adnexa, poorly visualized, new/progressive from the prior. -Additional pericapsular lesion along the lateral spleen is mildly progressive, suspicious for additional peritoneal implant. -No evidence of metastatic disease in the chest.   01/27/2021 PET scan   IMPRESSION:  1. Extensive hypermetabolic peritoneal metastasis, as detailed  above.  2. No evidence of hypermetabolic supradiaphragmatic disease.  3. Diffuse low-level thyroid hypermetabolism can be seen in the  setting of thyroiditis. Consider correlation with thyroid function  labs.  4. Aortic atherosclerosis (ICD10-I70.0) and emphysema (ICD10-J43.9).    02/01/2021 -  Chemotherapy    First-line chemo Xeloda 1500 mg twice daily for day 1-14, every 21 days, started on 02/01/2021. Reduced to 1 week on/ 1 week off and added bevacizumab q2weeks from C2 on 02/24/21.  ----Given good tolerance, I changed to CAPOX and bevacizumab q2weeks from C4 on 03/24/21 with Xeloda 1562m in the AM and 10042min the PM 1 week on/1 week off.     03/24/2021 -  Chemotherapy   Patient is on Treatment Plan : COLORECTAL CapeOx + Bevacizumab q21d     05/08/2021 Imaging   PET  IMPRESSION: 1. There is been interval development of several FDG avid pulmonary nodules within both lungs. Additionally, there is new FDG avid low left paratracheal lymph node. Thoracic metastasis cannot be excluded. 2. Interval improvement in multifocal FDG avid peritoneal metastasis as detailed above.     08/14/2021 Imaging   CT CAP  IMPRESSION: Resolution of previously seen small bilateral pulmonary nodules since previous study.   Stable 8 mm mediastinal lymph node in the AP window.   No evidence of metastatic disease within the abdomen or pelvis.   Nonocclusive pulmonary embolism in bilateral central lower lobe pulmonary arteries, which appears subacute in age.   12/25/2021 Imaging   EXAM: CT CHEST,  ABDOMEN, AND PELVIS WITH CONTRAST  IMPRESSION: 1. Minimal, bandlike residua of previously seen FDG avid pulmonary nodules, consistent with treated metastases. No new pulmonary nodules. 2. Unchanged post treatment appearance of the cecal base, with soft tissue thickening about the cecal base and adjacent pelvis, previously FDG avid and in keeping with known appendiceal orifice malignancy. 3. Unchanged central fluid attenuation lesions about the spleen, left iliopsoas, and left pelvic sidewall, previously FDG avid, consistent with treated peritoneal implants. 4. No evidence of new metastatic disease in the chest, abdomen, or pelvis 5. Status post rectosigmoid colon resection with left lower quadrant end colostomy.   Aortic Atherosclerosis (ICD10-I70.0).   [place] 01/06/2021 Procedure   (surveillance colonoscopy for h/o rectal cancer) Impression:  - Preparation of the colon was fair, lavage performed with mostly adequate views. - Polypoid lesion obliterating appendiceal orifice. Biopsied to evaluate for malignancy. - The examined portion of the ileum was normal. - Two large polyps in the cecum, not removed today pending path at appendiceal orifice, bowel prep. If removed endoscopically in the future would favor EMR to be done at the hospital. - One 5 mm polyp at the ileocecal valve, removed with a cold snare. Resected and retrieved. - One 3 mm polyp in the transverse colon,  removed with a cold snare. Resected and retrieved. - Parastomal hernia making initial entry to the distal colon challenging. - The examination was otherwise normal.   01/06/2021 Initial Biopsy   Diagnosis 1. Colon, biopsy, appendiceal oraface - ADENOCARCINOMA. 2. Colon, polyp(s), ileocecal valve, transverse, x2 - TUBULAR ADENOMA, NEGATIVE FOR HIGH GRADE DYSPLASIA (X1). - COLONIC MUCOSA WITH UNDERLYING LYMPHOID AGGREGATE, NEGATIVE FOR DYSPLASIA (X1).  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 This is new from the prior. -Suspected additional pelvic implants along the uterus and right adnexa, poorly visualized, new/progressive from the prior. -Additional pericapsular lesion along the lateral spleen is mildly progressive, suspicious for additional peritoneal implant. -No evidence of metastatic disease in the chest.   08/14/2021 Imaging   CT CAP IMPRESSION: Resolution of previously seen small bilateral pulmonary nodules since previous study. Stable 8 mm mediastinal lymph node in the AP window. No evidence of metastatic disease within the abdomen or pelvis. Nonocclusive pulmonary embolism in bilateral central lower lobe pulmonary arteries, which appears subacute in age.  Aortic Atherosclerosis (ICD10-I70.0) and Emphysema (ICD10-J43.9).     08/14/2021 Imaging   CT CAP  IMPRESSION: Resolution of previously seen small bilateral pulmonary nodules since previous study.   Stable 8 mm mediastinal lymph node in the AP window.   No evidence of metastatic disease within the abdomen or pelvis.   Nonocclusive pulmonary embolism in bilateral central lower lobe pulmonary arteries, which appears subacute in age.   12/25/2021 Imaging   EXAM: CT CHEST, ABDOMEN, AND PELVIS WITH CONTRAST  IMPRESSION: 1. Minimal, bandlike residua of previously seen FDG avid pulmonary nodules, consistent with treated metastases. No new pulmonary nodules. 2. Unchanged post treatment appearance of the cecal base, with soft tissue thickening about  the cecal base and adjacent pelvis, previously FDG avid and in keeping with known appendiceal orifice malignancy. 3. Unchanged central fluid attenuation lesions about the spleen, left iliopsoas, and left pelvic sidewall, previously FDG avid, consistent with treated peritoneal implants. 4. No evidence of new metastatic disease in the chest, abdomen, or pelvis 5. Status post rectosigmoid colon resection with left lower quadrant end colostomy.   Aortic Atherosclerosis (ICD10-I70.0).      INTERVAL HISTORY:  Tracy [name] was contacted for a follow up of  colorectal cancer   He was last seen by me on 01/04/2023 Pt called and stated her husband caught a stomach bug ,but afraid that she may have caught it.  Pt states she has some neuropathy on her left side, as long as she lay on her side where there is pain it stops by applying pressure. Pt states she has been having the pain for about 5 months.    .    All other systems were reviewed with the patient and are negative.  MEDICAL HISTORY:  Past Medical History:  Diagnosis Date   Acute deep vein thrombosis (DVT) of brachial vein of right upper extremity (HCC)    Acute on chronic respiratory failure with hypoxia (HCC)    Asthma    Cataract  [place], HX OF 01/06/2011   Qualifier: Diagnosis of  By: Charlett Blake MD, Stacey     Colon cancer [facility])    COPD (chronic obstructive pulmonary disease) ([place]) 2011   FeV1 31% predicted FeV1/FVX 47 %   COVID-19 05/2021   Essential hypertension 05/23/2020   Hemorrhoid    Open right radial fracture 2020   Osteopenia    Perforated sigmoid colon ([place]) 03/03/2020   Pleural effusion    Pneumonia 2021   Postoperative intra-abdominal abscess 2021   Rectal cancer (HCC)    Seasonal allergies    takes Claritin daily prn   Shock circulatory ([place])    Tracheostomy status ([place])    Vitamin D deficiency    takes Vit d every 14 days    SURGICAL HISTORY: Past Surgical History:  Procedure Laterality Date    AUGMENTATION MAMMAPLASTY     saline   EXAMINATION UNDER ANESTHESIA  10/14/2012   Procedure: EXAM UNDER ANESTHESIA;  Surgeon: Gayland Curry, MD,FACS;  Location: [place];  Service: General;  Laterality: N/A;  rectal exam under anesthesia excisional hemorrhoidectomy hemorrhoidal banding x two   EXAMINATION UNDER ANESTHESIA  02/03/2013   excision hemorrhoidal tissue   FOOT SURGERY     left bunionectomy   HEMORRHOIDECTOMY WITH HEMORRHOID BANDING  10/14/2012   Procedure: HEMORRHOIDECTOMY WITH HEMORRHOID BANDING;  Surgeon: Gayland Curry, MD,FACS;  Location: [place];  Service: General;  Laterality: N/A;   IR IMAGING GUIDED PORT INSERTION  02/03/2021   IR THORACENTESIS ASP PLEURAL SPACE W/IMG GUIDE  04/23/2020   LAPAROTOMY N/A 03/03/2020   Procedure: low anterior resection end colostomy;  Surgeon: [name], MD;  Location: [facility];  Service: General;  Laterality: N/A;   LAPAROTOMY N/A 03/12/2020   Procedure: EXPLORATORY LAPAROTOMY WITH BOWEL RESECTION AND COLOSTOMY;  Surgeon: [name], MD;  Location: [facility];  Service: General;  Laterality: N/A;   OPEN REDUCTION INTERNAL FIXATION (ORIF) DISTAL RADIAL FRACTURE Right 11/01/2018   Procedure: OPEN REDUCTION INTERNAL FIXATION (ORIF) DISTAL RADIAL FRACTURE;  Surgeon: [name], MD;  Location: [place];  Service: Orthopedics;  Laterality: Right;   TONSILLECTOMY  1960   recurrent otitis media   US ECHOCARDIOGRAPHY  02/2020    poor windows, normal LV function, severely dilated RV with moderately reduced function, RV volume and pressure overload, mildly dilated RA    I have reviewed the social history and family history with the patient and they are unchanged from previous note.  ALLERGIES:  is allergic to amlodipine and codeine.  MEDICATIONS:  Current Outpatient Medications  Medication Sig Dispense Refill   acetaminophen (TYLENOL) 325 MG tablet Take 2 tablets (650 mg total) by mouth every 6 (six) hours as needed for mild pain. 30  tablet 0   albuterol (PROVENTIL) (2.5 MG/3ML) 0.083% nebulizer solution Take 3 mLs (2.5 mg total) by nebulization every 4 (four) hours as needed for wheezing or shortness of breath. DX: J44.9 360 mL 5   albuterol (VENTOLIN HFA) 108 (90 Base) MCG/ACT inhaler Inhale 2 puffs into the lungs every 6 (six) hours as needed for wheezing or shortness of breath. 8 g 5   ALPRAZolam (XANAX) 0.25 MG tablet Take 1 tablet (0.25 mg total) by mouth at bedtime as needed for anxiety. 30 tablet 0   capecitabine (XELODA) 500 MG tablet Take 3 tablets every 12 hours. Take for 7 days on, 7 days off, repeat every 14 days. 84 tablet 2   docusate sodium (COLACE) 50 MG capsule Take 50 mg by mouth  daily as needed for mild constipation.     fluticasone (FLONASE) 50 MCG/ACT nasal spray SPRAY 2 SPRAYS INTO EACH NOSTRIL EVERY DAY 48 mL 0   Fluticasone-Umeclidin-Vilant (TRELEGY ELLIPTA) 100-62.5-25 MCG/ACT AEPB Inhale 1 puff into the lungs daily. 2 each 0   HYDROcodone bit-homatropine (HYCODAN) 5-1.5 MG/5ML syrup Take 5 mLs by mouth every 6 (six) hours as needed for cough. (Patient not taking: Reported on 11/23/2022) 240 mL 0   lidocaine-prilocaine (EMLA) cream Apply 1 application. topically as needed. 30 g 2   loratadine (CLARITIN) 10 MG tablet Take 1 tablet (10 mg total) by mouth daily. 90 tablet 3   losartan (COZAAR) 25 MG tablet Take 1 tablet (25 mg total) by mouth daily. 90 tablet 1   ondansetron (ZOFRAN) 8 MG tablet Take 1 tablet (8 mg total) by mouth every 8 (eight) hours as needed for nausea or vomiting. 20 tablet 2   prochlorperazine (COMPAZINE) 10 MG tablet Take 1 tablet (10 mg total) by mouth every 6 (six) hours as needed for nausea or vomiting. 30 tablet 2   rivaroxaban (XARELTO) 20 MG TABS tablet Take 1 tablet (20 mg total) by mouth daily with supper. 90 tablet 1   No current facility-administered medications for this visit.    PHYSICAL EXAMINATION: ECOG PERFORMANCE STATUS: 2 - Symptomatic, <50% confined to  bed  There were no vitals filed for this visit. Wt Readings from Last 3 Encounters:  01/21/23 118 lb (53.5 kg)  01/04/23 122 lb 1.6 oz (55.4 kg)  11/23/22 115 lb 1.6 oz (52.2 kg)     No vitals taken today, Exam not performed today  LABORATORY DATA:  I have reviewed the data as listed    Latest Ref Rng & Units 01/04/2023    9:26 AM 11/23/2022    9:39 AM 10/12/2022    8:34 AM  CBC  WBC 4.0 - 10.5 K/uL 8.0  5.3  6.9   Hemoglobin 12.0 - 15.0 g/dL 12.5  12.8  12.7   Hematocrit 36.0 - 46.0 % 36.9  38.7  37.9   Platelets 150 - 400 K/uL 326  285  262         Latest Ref Rng & Units 01/04/2023    9:26 AM 11/23/2022    9:39 AM 10/12/2022    8:34 AM  CMP  Glucose 70 - 99 mg/dL 86  97  114   BUN 8 - 23 mg/dL 12  20  19   $ Creatinine 0.44 - 1.00 mg/dL 0.98  0.78  0.91   Sodium 135 - 145 mmol/L 141  139  137   Potassium 3.5 - 5.1 mmol/L 3.1  3.7  3.9   Chloride 98 - 111 mmol/L 107  106  102   CO2 22 - 32 mmol/L 26  25  26   $ Calcium 8.9 - 10.3 mg/dL 9.1  9.3  9.2   Total Protein 6.5 - 8.1 g/dL 6.4  6.7  7.4   Total Bilirubin 0.3 - 1.2 mg/dL 0.4  0.5  0.8   Alkaline Phos 38 - 126 U/L 116  109  131   AST 15 - 41 U/L 18  16  23   $ ALT 0 - 44 U/L 8  8  10       $ RADIOGRAPHIC STUDIES: I have personally reviewed the radiological images as listed and agreed with the findings in the report. No results found.    No orders of the defined types were placed in this encounter.  All  questions were answered. The patient knows to call the clinic with any problems, questions or concerns. No barriers to learning was detected. The total time spent in the appointment was 25 minutes.     [name], MD 01/25/2023   Felicity Coyer am acting as scribe for [name], MD.   I have reviewed the above documentation for accuracy and completeness, and I agree with the above.

## 2023-01-26 ENCOUNTER — Encounter: Payer: Self-pay | Admitting: Hematology

## 2023-01-26 ENCOUNTER — Telehealth: Payer: Self-pay | Admitting: Hematology

## 2023-01-26 NOTE — Telephone Encounter (Signed)
Contacted patient to scheduled appointments. Patient is aware of appointments that are scheduled.   

## 2023-01-27 ENCOUNTER — Other Ambulatory Visit: Payer: Self-pay

## 2023-01-27 NOTE — Progress Notes (Signed)
Pharmacist Chemotherapy Monitoring - Initial Assessment    Anticipated start date: 02/01/23   The following has been reviewed per standard work regarding the patient's treatment regimen: The patient's diagnosis, treatment plan and drug doses, and organ/hematologic function Lab orders and baseline tests specific to treatment regimen  The treatment plan start date, drug sequencing, and pre-medications Prior authorization status  Patient's documented medication list, including drug-drug interaction screen and prescriptions for anti-emetics and supportive care specific to the treatment regimen The drug concentrations, fluid compatibility, administration routes, and timing of the medications to be used The patient's access for treatment and lifetime cumulative dose history, if applicable  The patient's medication allergies and previous infusion related reactions, if applicable   Changes made to treatment plan:  N/A  Follow up needed:  Pending authorization for treatment    Kennith Center, Pharm.D., CPP 01/27/2023@4$ :21 PM

## 2023-01-29 MED FILL — Dexamethasone Sodium Phosphate Inj 100 MG/10ML: INTRAMUSCULAR | Qty: 1 | Status: AC

## 2023-01-31 DIAGNOSIS — Z7189 Other specified counseling: Secondary | ICD-10-CM | POA: Insufficient documentation

## 2023-01-31 DIAGNOSIS — Z86718 Personal history of other venous thrombosis and embolism: Secondary | ICD-10-CM | POA: Insufficient documentation

## 2023-01-31 NOTE — Assessment & Plan Note (Signed)
-  continue meds -f/u with pulmonary

## 2023-01-31 NOTE — Assessment & Plan Note (Signed)
-  incidental finding on CT 08/14/21 -no hypoxia, stable respiratory status. She began xarelto 08/15/21 -Beva was held through 12/28/21

## 2023-01-31 NOTE — Assessment & Plan Note (Signed)
pT4aN1b stage III, MMR normal, peritoneal metastasis in 01/2021 -diagnosed 02/2020, s/p emergent surgery for perforation. Path showed invasive adenocarcinoma with perforation, +3/15 LNs, and clear margins. Initial CT scan negative for distant metastasis. She was not a candidate for adjuvant chemo due to very slow recovery from surgery -Guardant Reveal ctDNA were negative (06/06/20, 07/12/20, and 09/10/20). -surveillance colonoscopy by Dr. Havery Moros 01/06/21 showed new adenocarcinoma at appendiceal orifice. Biopsy confirmed adenocarcinoma -01/27/21 PET showed tumor in appendiceal orifice is likely peritoneal metastasis growing into the cecum. Her peritoneal implants are mainly in the pelvis, difficult to biopsy -She began Xeloda on 02/01/21. Cycle 2 was changed to 1 week on/1 week off, and beva was added. She tolerates this well -PS improved and low dose oxaliplatin was added with C4, now on Capeox/beva q3 weeks, with continuing Xeloda 1 week on/1 week off. -Beva was held for PE found on CT 08/2021, she is on xarelto. Resumed beva q3 weeks 12/29/21 -she twisted her ankle on 07/22/22, Oxaliplatin and beva were held and restarted on 9/14. She continued Xeloda as prescribed through recovery.  -Her PET scan from January 21, 2023 showed overall disease progression comparing to PET from January 2023.  Will change her chemotherapy to FOLFIRI and beva, starting today.

## 2023-01-31 NOTE — Assessment & Plan Note (Signed)
-  We again discussed the incurable nature of her cancer, and the overall poor prognosis, especially if she progress on chemo -The patient understands the goal of care is palliative. -I recommend DNR/DNI, she will think about it

## 2023-01-31 NOTE — Assessment & Plan Note (Signed)
pT4aN1b stage III, MMR normal, peritoneal metastasis in 01/2021 -diagnosed 02/2020, s/p emergent surgery for perforation. Path showed invasive adenocarcinoma with perforation, +3/15 LNs, and clear margins. Initial CT scan negative for distant metastasis. She was not a candidate for adjuvant chemo due to very slow recovery from surgery -Guardant Reveal ctDNA were negative (06/06/20, 07/12/20, and 09/10/20). -surveillance colonoscopy by Dr. Havery Moros 01/06/21 showed new adenocarcinoma at appendiceal orifice. Biopsy confirmed adenocarcinoma -01/27/21 PET showed tumor in appendiceal orifice is likely peritoneal metastasis growing into the cecum. Her peritoneal implants are mainly in the pelvis, difficult to biopsy -She began Xeloda on 02/01/21. Cycle 2 was changed to 1 week on/1 week off, and beva was added. She tolerates this well -PS improved and low dose oxaliplatin was added with C4, now on Capeox/beva q3 weeks, with continuing Xeloda 1 week on/1 week off. -Beva was held for PE found on CT 08/2021, she is on xarelto. Resumed beva q3 weeks 12/29/21 -she twisted her ankle on 07/22/22, Oxaliplatin and beva were held and restarted on 9/14. She continued Xeloda as prescribed through recovery.  -PET from 01/21/2023 showed overall cancer progression compared to July 2023. -Will change her treatment to FOLFIRI, starting today

## 2023-02-01 ENCOUNTER — Inpatient Hospital Stay: Payer: Medicare HMO

## 2023-02-01 ENCOUNTER — Other Ambulatory Visit: Payer: Self-pay

## 2023-02-01 ENCOUNTER — Encounter: Payer: Self-pay | Admitting: Hematology

## 2023-02-01 ENCOUNTER — Inpatient Hospital Stay: Payer: Medicare HMO | Admitting: Hematology

## 2023-02-01 VITALS — BP 182/98 | HR 92 | Temp 97.6°F | Resp 20 | Ht 60.0 in | Wt 117.8 lb

## 2023-02-01 DIAGNOSIS — C20 Malignant neoplasm of rectum: Secondary | ICD-10-CM | POA: Diagnosis present

## 2023-02-01 DIAGNOSIS — J449 Chronic obstructive pulmonary disease, unspecified: Secondary | ICD-10-CM

## 2023-02-01 DIAGNOSIS — Z5111 Encounter for antineoplastic chemotherapy: Secondary | ICD-10-CM | POA: Diagnosis present

## 2023-02-01 DIAGNOSIS — C19 Malignant neoplasm of rectosigmoid junction: Secondary | ICD-10-CM

## 2023-02-01 DIAGNOSIS — Z5112 Encounter for antineoplastic immunotherapy: Secondary | ICD-10-CM | POA: Diagnosis present

## 2023-02-01 DIAGNOSIS — Z7189 Other specified counseling: Secondary | ICD-10-CM

## 2023-02-01 DIAGNOSIS — Z86718 Personal history of other venous thrombosis and embolism: Secondary | ICD-10-CM

## 2023-02-01 DIAGNOSIS — C786 Secondary malignant neoplasm of retroperitoneum and peritoneum: Secondary | ICD-10-CM | POA: Diagnosis not present

## 2023-02-01 DIAGNOSIS — Z7901 Long term (current) use of anticoagulants: Secondary | ICD-10-CM | POA: Diagnosis not present

## 2023-02-01 DIAGNOSIS — Z86711 Personal history of pulmonary embolism: Secondary | ICD-10-CM | POA: Diagnosis not present

## 2023-02-01 DIAGNOSIS — C799 Secondary malignant neoplasm of unspecified site: Secondary | ICD-10-CM

## 2023-02-01 DIAGNOSIS — Z933 Colostomy status: Secondary | ICD-10-CM | POA: Diagnosis not present

## 2023-02-01 DIAGNOSIS — Z79899 Other long term (current) drug therapy: Secondary | ICD-10-CM | POA: Diagnosis not present

## 2023-02-01 LAB — CBC WITH DIFFERENTIAL (CANCER CENTER ONLY)
Abs Immature Granulocytes: 0 10*3/uL (ref 0.00–0.07)
Basophils Absolute: 0 10*3/uL (ref 0.0–0.1)
Basophils Relative: 1 %
Eosinophils Absolute: 0.1 10*3/uL (ref 0.0–0.5)
Eosinophils Relative: 2 %
HCT: 39 % (ref 36.0–46.0)
Hemoglobin: 12.9 g/dL (ref 12.0–15.0)
Immature Granulocytes: 0 %
Lymphocytes Relative: 20 %
Lymphs Abs: 1.3 10*3/uL (ref 0.7–4.0)
MCH: 33.4 pg (ref 26.0–34.0)
MCHC: 33.1 g/dL (ref 30.0–36.0)
MCV: 101 fL — ABNORMAL HIGH (ref 80.0–100.0)
Monocytes Absolute: 0.6 10*3/uL (ref 0.1–1.0)
Monocytes Relative: 9 %
Neutro Abs: 4.4 10*3/uL (ref 1.7–7.7)
Neutrophils Relative %: 68 %
Platelet Count: 286 10*3/uL (ref 150–400)
RBC: 3.86 MIL/uL — ABNORMAL LOW (ref 3.87–5.11)
RDW: 14.7 % (ref 11.5–15.5)
WBC Count: 6.4 10*3/uL (ref 4.0–10.5)
nRBC: 0 % (ref 0.0–0.2)

## 2023-02-01 LAB — CMP (CANCER CENTER ONLY)
ALT: 10 U/L (ref 0–44)
AST: 22 U/L (ref 15–41)
Albumin: 3.9 g/dL (ref 3.5–5.0)
Alkaline Phosphatase: 120 U/L (ref 38–126)
Anion gap: 9 (ref 5–15)
BUN: 13 mg/dL (ref 8–23)
CO2: 27 mmol/L (ref 22–32)
Calcium: 9 mg/dL (ref 8.9–10.3)
Chloride: 105 mmol/L (ref 98–111)
Creatinine: 0.93 mg/dL (ref 0.44–1.00)
GFR, Estimated: >60 mL/min (ref >60)
Glucose, Bld: 139 mg/dL — ABNORMAL HIGH (ref 70–99)
Potassium: 3.5 mmol/L (ref 3.5–5.1)
Sodium: 141 mmol/L (ref 135–145)
Total Bilirubin: 0.5 mg/dL (ref 0.3–1.2)
Total Protein: 7 g/dL (ref 6.5–8.1)

## 2023-02-01 LAB — CEA (IN HOUSE-CHCC): CEA (CHCC-In House): 38.52 ng/mL — ABNORMAL HIGH (ref 0.00–5.00)

## 2023-02-01 LAB — TOTAL PROTEIN, URINE DIPSTICK: Protein, ur: 30 mg/dL — AB

## 2023-02-01 MED ORDER — GABAPENTIN 100 MG PO CAPS: 100.0000 mg | ORAL_CAPSULE | Freq: Every day | ORAL | 0 refills | Status: DC

## 2023-02-01 MED ORDER — ATROPINE SULFATE 1 MG/ML IV SOLN
0.5000 mg | Freq: Once | INTRAVENOUS | Status: AC
  Administered 2023-02-01: 0.5 mg via INTRAVENOUS

## 2023-02-01 MED ORDER — SODIUM CHLORIDE 0.9% FLUSH
10.0000 mL | Freq: Once | INTRAVENOUS | Status: AC
  Administered 2023-02-01: 10 mL

## 2023-02-01 MED ORDER — CLONIDINE HCL 0.1 MG PO TABS
0.1000 mg | ORAL_TABLET | Freq: Once | ORAL | Status: AC
  Administered 2023-02-01: 0.1 mg via ORAL
  Filled 2023-02-01: qty 1

## 2023-02-01 MED ORDER — ATROPINE SULFATE 1 MG/ML IV SOLN
INTRAVENOUS | Status: AC
  Filled 2023-02-01: qty 1

## 2023-02-01 MED ORDER — ATROPINE SULFATE 1 MG/ML IV SOLN
0.5000 mg | Freq: Once | INTRAVENOUS | Status: AC | PRN
  Administered 2023-02-01: 0.5 mg via INTRAVENOUS
  Filled 2023-02-01: qty 1

## 2023-02-01 MED ORDER — SODIUM CHLORIDE 0.9 % IV SOLN
150.0000 mg/m2 | Freq: Once | INTRAVENOUS | Status: AC
  Administered 2023-02-01: 220 mg via INTRAVENOUS
  Filled 2023-02-01: qty 11

## 2023-02-01 MED ORDER — SODIUM CHLORIDE 0.9 % IV SOLN
5.0000 mg/kg | Freq: Once | INTRAVENOUS | Status: AC
  Administered 2023-02-01: 275 mg via INTRAVENOUS
  Filled 2023-02-01: qty 11

## 2023-02-01 MED ORDER — SODIUM CHLORIDE 0.9 % IV SOLN
400.0000 mg/m2 | Freq: Once | INTRAVENOUS | Status: AC
  Administered 2023-02-01: 600 mg via INTRAVENOUS
  Filled 2023-02-01: qty 30

## 2023-02-01 MED ORDER — SODIUM CHLORIDE 0.9 % IV SOLN
2000.0000 mg/m2 | INTRAVENOUS | Status: DC
  Administered 2023-02-01: 3000 mg via INTRAVENOUS
  Filled 2023-02-01: qty 60

## 2023-02-01 MED ORDER — PALONOSETRON HCL INJECTION 0.25 MG/5ML
0.2500 mg | Freq: Once | INTRAVENOUS | Status: AC
  Administered 2023-02-01: 0.25 mg via INTRAVENOUS
  Filled 2023-02-01: qty 5

## 2023-02-01 MED ORDER — SODIUM CHLORIDE 0.9 % IV SOLN: Freq: Once | INTRAVENOUS | Status: AC

## 2023-02-01 MED ORDER — SODIUM CHLORIDE 0.9 % IV SOLN
10.0000 mg | Freq: Once | INTRAVENOUS | Status: AC
  Administered 2023-02-01: 10 mg via INTRAVENOUS
  Filled 2023-02-01: qty 10

## 2023-02-01 NOTE — Progress Notes (Signed)
Per Dr Burr Medico, ok to proceed with treatment today with elevated BP 189/95.

## 2023-02-01 NOTE — Progress Notes (Signed)
Pt rate increased back to original of 339m/hr.

## 2023-02-01 NOTE — Progress Notes (Signed)
[place]   Telephone:(336) [phone] Fax:(336) [phone]   Clinic Follow up Note   Patient Care Team: [name], DO as PCP - General (Family Medicine) [name], MD as Consulting Physician (Hematology) Icard, Octavio Graves, DO as Consulting Physician (Pulmonary Disease) Pella, P.A. [name], MD as Consulting Physician (General Surgery) [name], MD as Consulting Physician (General Surgery) [name], [name], MD as Consulting Physician (Gastroenterology)  Date of Service:  02/01/2023  CHIEF COMPLAINT: f/u of colorectal cancer     CURRENT THERAPY: FOLFIRI+BEVAACIZUMAB start 02/01/2023    ASSESSMENT:  Tracy [name] is a 72 y.o. 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 Path showed invasive adenocarcinoma with perforation, +3/15 LNs, and clear margins. Initial CT scan negative for distant metastasis. She was not a candidate for adjuvant chemo due to very slow recovery from surgery -Guardant Reveal ctDNA were negative (06/06/20, 07/12/20, and 09/10/20). -surveillance colonoscopy by Dr. Havery Moros 01/06/21 showed new adenocarcinoma at appendiceal orifice. Biopsy confirmed adenocarcinoma -01/27/21 PET showed tumor in appendiceal orifice is likely peritoneal metastasis growing into the cecum. Her peritoneal implants are mainly in the pelvis, difficult to biopsy -She began Xeloda on 02/01/21. Cycle 2 was changed to 1 week on/1 week off, and beva was added. She tolerates this well -PS improved and low dose oxaliplatin was added with C4, now on Capeox/beva q3 weeks, with continuing Xeloda 1 week on/1 week off. -Beva was held for PE found on CT 08/2021, she is on xarelto. Resumed beva q3 weeks 12/29/21 -she twisted her ankle on 07/22/22, Oxaliplatin and beva were held and restarted on 9/14. She continued Xeloda as prescribed through  recovery.  -Her PET scan from January 21, 2023 showed overall disease progression comparing to PET from January 2023.  Will change her chemotherapy to FOLFIRI and beva, starting today.  Goals of care, counseling/discussion -We again discussed the incurable nature of her cancer, and the overall poor prognosis, especially if she progress on chemo -The patient understands the goal of care is palliative. -I recommend DNR/DNI, she will think about it     COPD, severe ([place]) -continue meds -f/u with pulmonary   Personal history of venous thrombosis and embolism -incidental finding on CT 08/14/21 -no hypoxia, stable respiratory status. She began xarelto 08/15/21 -Beva was held through 12/28/21     PLAN: - I prescribed Gabapentin for left thigh  pain.  -lab reviewed -Proceed with Day1,Cycle1 FOLFIRI+BEVA at reduce dose  -Encourage the pt to stay Hydrated during this treatment, we discussed management of diarrhea -lab, flush and Folfiri 02/15/2023    SUMMARY OF ONCOLOGIC HISTORY: Oncology History Overview Note  Cancer Staging Rectal cancer [facility]) Staging form: Colon and Rectum, AJCC 8th Edition - Pathologic stage from 03/03/2020: Stage IIIB (pT4a, pN1b, cM0) - Signed by [name], MD on 01/28/2021 Stage prefix: Initial diagnosis Histologic grading system: 4 grade system Histologic grade (G): G2 Residual tumor (R): R0 - None    Rectosigmoid cancer ([place])  03/03/2020 - 04/05/2020 Hospital Admission   She was admitted to ED on 03/03/20 for abdominal pain and nausea. She had been having diarrhea for several weeks. During hospital stay she developed acute respiratory failure, AKI, RUE DVT from PICC line. Work up showed bowel perforation, small left liver mass and thickening of jejunum. She underwent emergent surgery on 03/03/20 for resection and colostomy placement. Her path showed invasive cancer, metastatic to 3/15  LNs. She had a NGT in placed but this was removed on POD 3. Post op her stoma became  necrotic and septic. She had another bowel surgery on 03/12/20, which was NED with necrotic tissue.    03/03/2020 Imaging   CT AP 03/03/20  IMPRESSION: 1. Free intraperitoneal air consistent perforation. Most likely site of perforation is in the distal splenic flexure/proximal descending colon where there is a collection of air and gas measuring 6 centimeters. No obvious soft tissue mass identified in this region. At this site, there is abrupt transition of dilated, stool-filled colon to completely decompressed proximal descending colon. 2. Thickened, inflamed loops of jejunum are identified within the pelvis and are likely reactive. 3. Small hiatal hernia. 4. Benign-appearing 1.6 centimeter mass the LEFT hepatic lobe. Recommend comparison with prior studies if available. 5.  Emphysema (ICD10-J43.9). 6.  Aortic Atherosclerosis (ICD10-I70.0). 7. Bilateral renal scarring.   03/03/2020 Surgery   low anterior resection end colostomy by Dr Marcello Moores    03/03/2020 Initial Biopsy   FINAL MICROSCOPIC DIAGNOSIS: 03/03/20 A. RECTOSIGMOID COLON, LOW ANTERIOR RESECTION:  - Invasive colonic adenocarcinoma, 3.5 cm.  - Tumor invades the visceral peritoneum.  - Margins of resection are not involved.  - Metastatic carcinoma in (3) of (15) lymph nodes.  - See oncology table.   B. ADDITIONAL SIGMOID COLON, RESECTION:  - Colonic tissue, negative for carcinoma.  ADDENDUM:  Mismatch Repair Protein (IHC)   SUMMARY INTERPRETATION: NORMAL  There is preserved expression of the major MMR proteins. There is a very  low probability that microsatellite instability (MSI) is present.  However, certain clinically significant MMR protein mutations may result  in preservation of nuclear expression. It is recommended that the  preservation of protein expression be correlated with molecular based  MSI testing.   IHC EXPRESSION RESULTS  TEST           RESULT  MLH1:          Preserved nuclear expression  MSH2:           Preserved nuclear expression  MSH6:          Preserved nuclear expression  PMS2:          Preserved nuclear expression   03/03/2020 Cancer Staging   Staging form: Colon and Rectum, AJCC 8th Edition - Pathologic stage from 03/03/2020: Stage IIIB (pT4a, pN1b, cM0) - Signed by [name], MD on 01/28/2021 Stage prefix: Initial diagnosis Histologic grading system: 4 grade system Histologic grade (G): G2 Residual tumor (R): R0 - None   03/08/2020 Imaging   CT AP 03/08/20 IMPRESSION: 1. Interval midline laparotomy with distal colon resection and diverting left lower quadrant colostomy. 2. Diffuse small bowel dilatation with gas fluid levels most consistent with postoperative ileus. 3. Trace ascites within the abdomen and pelvis. No fluid collection or abscess at this time. Surgical drain within the lower pelvis. 4. Indeterminate 1.4 cm subcapsular liver hypodensity. In light of newly diagnosed rectal cancer, metastatic disease cannot be excluded. PET CT may be useful for further evaluation. 5. Interval development of trace bilateral pleural effusions and diffuse body wall edema.   03/12/2020 Surgery   EXPLORATORY LAPAROTOMY WITH BOWEL RESECTION AND COLOSTOMY by Dr Hassell Done 03/12/20  FINAL MICROSCOPIC DIAGNOSIS: 03/12/20  A. COLON, SPLENIC FLEXURE, RESECTION:  - Segment of colon (37 cm) with perforation and associated inflammation  - Multiple mucosal ulcers with necrotizing inflammation  - No evidence of malignancy    03/12/2020 Imaging   CT AP WO contrast 03/12/20  IMPRESSION: 1. Exam is limited by lack of intravenous contrast, motion, and artifact from the patient's arms adjacent to the torso. Since 03/08/2020, there has been development of relatively large pockets of intraperitoneal free air. While intraperitoneal gas would not be unexpected on postoperative day 9, this gas is new since an intervening study of 03/08/2020 and given the relatively large volume certainly raises concern  for bowel perforation. No source for the intraperitoneal free air is evident on this study. There is a surgical drain in the pelvis, but there is no free gas around the drain itself to suggest that it represents the source. 2. New circumferential wall thickening in the splenic flexure, descending colon and sigmoid colon leading into the end colostomy. Infection/inflammation would be a consideration. Ischemia cannot be excluded. 3. Relatively small volume intraperitoneal free fluid. High attenuation small fluid collections in the left upper abdomen may reflect hemorrhage, infection or residua from prior perforation. 4. Interval progression of diffuse body wall edema. 5. Residual contrast material in the renal parenchyma from prior imaging, compatible with renal dysfunction.    03/19/2020 Imaging   CT CAP 03/19/20  IMPRESSION: 1. 5.5 x 3.2 cm fluid collection is noted along the greater curvature of the proximal stomach. 2. Surgical drain is again noted in the pelvis with tip in left lower quadrant. 3. Interval development of crescent-shaped fluid collection measuring 16.4 x 3.3 cm in the epigastric region and left upper quadrant of the abdomen which may extend into the left lower quadrant. Potentially this may represent abscess or developing abscess. Multiple other smaller fluid collections are noted which may represent small abscesses. 4. 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 5. Mild amount of free fluid is noted in the posterior pelvis. 6. Moderate anasarca is noted. Aortic Atherosclerosis (ICD10-I70.0).   04/12/2020 Imaging   CT AP 04/12/20  IMPRESSION: 1. Fluid collections within the LEFT abdomen have significantly decreased compared to previous CT exams, now nearly completely resolved. 2. Percutaneous drainage catheter with tip coiled posterior to the LEFT kidney, stable positioning compared to the previous study. The more anterior catheter has been removed. 3. Small  amount of free fluid persists within the abdomen and pelvis. 4. Trace bibasilar pleural effusions. 5. Anasarca. 6. While reviewing today's study, comparing with a chest/abdomen/pelvis CT from earlier same month, there is question of thrombus in the LEFT internal jugular vein. Recommend ultrasound of the LEFT IJ to exclude DVT. This recommendation discussed with patient's hospitalist, Dr. Owens Shark, on 04/12/2020 at 4:20 p.m.   Emphysema (ICD10-J43.9).   04/22/2020 Imaging   CT AP 04/22/20  IMPRESSION: 1. No recurrent intra-abdominal abscess status post interval removal of left retroperitoneal percutaneous drain. Stable small amount of free pelvic fluid. 2. Enlarging dependent bilateral pleural effusions, now moderate in volume. Associated atelectasis at both lung bases. 3. Progressive anasarca with generalized edema throughout the subcutaneous fat. 4. Stable small subcapsular fluid collection along the anterior aspect of the left hepatic lobe. 5. Aortic Atherosclerosis (ICD10-I70.0).   05/23/2020 Initial Diagnosis   Rectal cancer ([place])   01/14/2021 Imaging   CT CAP IMPRESSION: -5.0 cm soft tissue mass along the posterior aspect of the cecum at the base of the appendix, favored to reflect a peritoneal/serosal implant, corresponding to the patient's known adenocarcinoma. This is new from the prior. -Suspected additional pelvic implants along the uterus and right adnexa, poorly visualized, new/progressive from the prior. -Additional pericapsular lesion along the lateral spleen is mildly progressive, suspicious for additional peritoneal implant. -No evidence of  metastatic disease in the chest.   01/27/2021 PET scan   IMPRESSION:  1. Extensive hypermetabolic peritoneal metastasis, as detailed  above.  2. No evidence of hypermetabolic supradiaphragmatic disease.  3. Diffuse low-level thyroid hypermetabolism can be seen in the  setting of thyroiditis. Consider correlation with thyroid  function  labs.  4. Aortic atherosclerosis (ICD10-I70.0) and emphysema (ICD10-J43.9).    02/01/2021 -  Chemotherapy    First-line chemo Xeloda 1500 mg twice daily for day 1-14, every 21 days, started on 02/01/2021. Reduced to 1 week on/ 1 week off and added bevacizumab q2weeks from C2 on 02/24/21.  ----Given good tolerance, I changed to CAPOX and bevacizumab q2weeks from C4 on 03/24/21 with Xeloda 1554m in the AM and 10039min the PM 1 week on/1 week off.     03/24/2021 - 01/04/2023 Chemotherapy   Patient is on Treatment Plan : COLORECTAL CapeOx + Bevacizumab q21d     05/08/2021 Imaging   PET  IMPRESSION: 1. There is been interval development of several FDG avid pulmonary nodules within both lungs. Additionally, there is new FDG avid low left paratracheal lymph node. Thoracic metastasis cannot be excluded. 2. Interval improvement in multifocal FDG avid peritoneal metastasis as detailed above.     08/14/2021 Imaging   CT CAP  IMPRESSION: Resolution of previously seen small bilateral pulmonary nodules since previous study.   Stable 8 mm mediastinal lymph node in the AP window.   No evidence of metastatic disease within the abdomen or pelvis.   Nonocclusive pulmonary embolism in bilateral central lower lobe pulmonary arteries, which appears subacute in age.   12/25/2021 Imaging   EXAM: CT CHEST, ABDOMEN, AND PELVIS WITH CONTRAST  IMPRESSION: 1. Minimal, bandlike residua of previously seen FDG avid pulmonary nodules, consistent with treated metastases. No new pulmonary nodules. 2. Unchanged post treatment appearance of the cecal base, with soft tissue thickening about the cecal base and adjacent pelvis, previously FDG avid and in keeping with known appendiceal orifice malignancy. 3. Unchanged central fluid attenuation lesions about the spleen, left iliopsoas, and left pelvic sidewall, previously FDG avid, consistent with treated peritoneal implants. 4. No evidence of new  metastatic disease in the chest, abdomen, or pelvis 5. Status post rectosigmoid colon resection with left lower quadrant end colostomy.   Aortic Atherosclerosis (ICD10-I70.0).   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 - Polypoid lesion obliterating appendiceal orifice. Biopsied to evaluate for malignancy. - The examined portion of the ileum was normal. - Two large polyps in the cecum, not removed today pending path at appendiceal orifice, bowel prep. If removed endoscopically in the future would favor EMR to be done at the hospital. - One 5 mm polyp at the ileocecal valve, removed with a cold snare. Resected and retrieved. - One 3 mm polyp in the transverse colon, removed with a cold snare. Resected and retrieved. - Parastomal hernia making initial entry to the distal colon challenging. - The examination was otherwise normal.   01/06/2021 Initial Biopsy   Diagnosis 1. Colon, biopsy, appendiceal oraface - ADENOCARCINOMA. 2. Colon, polyp(s), ileocecal valve, transverse, x2 - TUBULAR ADENOMA, NEGATIVE FOR HIGH GRADE DYSPLASIA (X1). - COLONIC MUCOSA WITH UNDERLYING LYMPHOID AGGREGATE, NEGATIVE FOR DYSPLASIA (X1).  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 This is new from  the prior. -Suspected additional pelvic implants along the uterus and right adnexa, poorly visualized, new/progressive from the prior. -Additional pericapsular lesion along the lateral spleen is mildly progressive, suspicious for additional peritoneal implant. -No evidence of metastatic disease in the chest.   08/14/2021 Imaging   CT CAP IMPRESSION: Resolution of previously seen small bilateral pulmonary nodules since previous study. Stable 8 mm mediastinal lymph node in the AP window. No evidence of metastatic disease within the abdomen or pelvis. Nonocclusive pulmonary embolism in bilateral central lower lobe pulmonary arteries, which appears subacute in age.  Aortic Atherosclerosis (ICD10-I70.0) and Emphysema (ICD10-J43.9).     08/14/2021 Imaging   CT CAP  IMPRESSION: Resolution of previously seen small bilateral pulmonary nodules since previous study.   Stable 8 mm mediastinal lymph node in the AP window.   No evidence of metastatic disease within the abdomen or pelvis.   Nonocclusive pulmonary embolism in bilateral central lower lobe pulmonary arteries, which appears subacute in age.   12/25/2021 Imaging   EXAM: CT CHEST, ABDOMEN, AND PELVIS WITH CONTRAST  IMPRESSION: 1. Minimal, bandlike residua of previously seen FDG avid pulmonary nodules, consistent with treated metastases. No new pulmonary nodules. 2. Unchanged post treatment appearance of the cecal base, with soft tissue thickening about the cecal base and adjacent pelvis, previously FDG avid and in keeping with known appendiceal orifice malignancy. 3. Unchanged central fluid attenuation lesions about the spleen, left iliopsoas, and left pelvic sidewall, previously FDG avid, consistent with treated peritoneal implants. 4. No evidence of new metastatic disease in the chest, abdomen, or pelvis 5. Status post rectosigmoid colon resection with left lower quadrant end colostomy.   Aortic Atherosclerosis  (ICD10-I70.0).      INTERVAL HISTORY:  Tracy [name] is here for a follow up of  colorectal cancer   She was last seen by me on 01/25/2023 She presents to the clinic accompanied by husband. Pt state that the burning sensation in her left upper side of the leg has gotten worse/ Pt state that she has numbness and tingling in her toes.     All other systems were reviewed with the patient and are negative.  MEDICAL HISTORY:  Past Medical History:  Diagnosis Date   Acute deep vein thrombosis (DVT) of brachial vein of right upper extremity (HCC)    Acute on chronic respiratory failure with hypoxia (HCC)    Asthma    Cataract    CHICKENPOX, HX OF 01/06/2011   Qualifier: Diagnosis of  By: Charlett Blake MD, St[place]     Colon cancer [facility])    COPD (chronic obstructive pulmonary disease) ([place]) 2011   FeV1 31% predicted FeV1/FVX 47 %   COVID-19 05/2021   Essential hypertension 05/23/2020   Hemorrhoid    Open right radial fracture 2020   Osteopenia    Perforated sigmoid colon ([place]) 03/03/2020   Pleural effusion    Pneumonia 2021   Postoperative intra-abdominal abscess 2021   Rectal cancer (HCC)    Seasonal allergies    takes  Claritin daily prn   Shock circulatory ([place])    Tracheostomy status ([place])    Vitamin D deficiency    takes Vit d every 14 days    SURGICAL HISTORY: Past Surgical History:  Procedure Laterality Date   AUGMENTATION MAMMAPLASTY     saline   EXAMINATION UNDER ANESTHESIA  10/14/2012   Procedure: EXAM UNDER ANESTHESIA;  Surgeon: Gayland Curry, MD,FACS;  Location: [place];  Service: General;  Laterality: N/A;  rectal exam under anesthesia excisional hemorrhoidectomy hemorrhoidal banding x two   EXAMINATION UNDER ANESTHESIA  02/03/2013   excision hemorrhoidal tissue   FOOT SURGERY     left bunionectomy   HEMORRHOIDECTOMY WITH HEMORRHOID BANDING  10/14/2012   Procedure: HEMORRHOIDECTOMY WITH HEMORRHOID BANDING;  Surgeon: Gayland Curry, MD,FACS;  Location: [place];  Service:  General;  Laterality: N/A;   IR IMAGING GUIDED PORT INSERTION  02/03/2021   IR THORACENTESIS ASP PLEURAL SPACE W/IMG GUIDE  04/23/2020   LAPAROTOMY N/A 03/03/2020   Procedure: low anterior resection end colostomy;  Surgeon: [name], MD;  Location: [facility];  Service: General;  Laterality: N/A;   LAPAROTOMY N/A 03/12/2020   Procedure: EXPLORATORY LAPAROTOMY WITH BOWEL RESECTION AND COLOSTOMY;  Surgeon: [name], MD;  Location: [facility];  Service: General;  Laterality: N/A;   OPEN REDUCTION INTERNAL FIXATION (ORIF) DISTAL RADIAL FRACTURE Right 11/01/2018   Procedure: OPEN REDUCTION INTERNAL FIXATION (ORIF) DISTAL RADIAL FRACTURE;  Surgeon: [name], MD;  Location: [place];  Service: Orthopedics;  Laterality: Right;   TONSILLECTOMY  1960   recurrent otitis media   US ECHOCARDIOGRAPHY  02/2020    poor windows, normal LV function, severely dilated RV with moderately reduced function, RV volume and pressure overload, mildly dilated RA    I have reviewed the social history and family history with the patient and they are unchanged from previous note.  ALLERGIES:  is allergic to amlodipine and codeine.  MEDICATIONS:  Current Outpatient Medications  Medication Sig Dispense Refill   gabapentin (NEURONTIN) 100 MG capsule Take 1 capsule (100 mg total) by mouth at bedtime. OK to increase by 14m every week until 3027mfor left thigh pain. 60 capsule 0   acetaminophen (TYLENOL) 325 MG tablet Take 2 tablets (650 mg total) by mouth every 6 (six) hours as needed for mild pain. 30 tablet 0   albuterol (PROVENTIL) (2.5 MG/3ML) 0.083% nebulizer solution Take 3 mLs (2.5 mg total) by nebulization every 4 (four) hours as needed for wheezing or shortness of breath. DX: J44.9 360 mL 5   albuterol (VENTOLIN HFA) 108 (90 Base) MCG/ACT inhaler Inhale 2 puffs into the lungs every 6 (six) hours as needed for wheezing or shortness of breath. 8 g 5   ALPRAZolam (XANAX) 0.25 MG tablet Take 1  tablet (0.25 mg total) by mouth at bedtime as needed for anxiety. 30 tablet 0   capecitabine (XELODA) 500 MG tablet Take 3 tablets every 12 hours. Take for 7 days on, 7 days off, repeat every 14 days. 84 tablet 2   docusate sodium (COLACE) 50 MG capsule Take 50 mg by mouth daily as needed for mild constipation.     fluticasone (FLONASE) 50 MCG/ACT nasal spray SPRAY 2 SPRAYS INTO EACH NOSTRIL EVERY DAY 48 mL 0   Fluticasone-Umeclidin-Vilant (TRELEGY ELLIPTA) 100-62.5-25 MCG/ACT AEPB Inhale 1 puff into the lungs daily. 2 each 0   HYDROcodone bit-homatropine (HYCODAN) 5-1.5 MG/5ML syrup Take 5 mLs by mouth every 6 (six) hours as needed for cough. (  Patient not taking: Reported on 11/23/2022) 240 mL 0   lidocaine-prilocaine (EMLA) cream Apply 1 application. topically as needed. 30 g 2   loratadine (CLARITIN) 10 MG tablet Take 1 tablet (10 mg total) by mouth daily. 90 tablet 3   losartan (COZAAR) 25 MG tablet Take 1 tablet (25 mg total) by mouth daily. 90 tablet 1   ondansetron (ZOFRAN) 8 MG tablet Take 1 tablet (8 mg total) by mouth every 8 (eight) hours as needed for nausea or vomiting. 20 tablet 2   prochlorperazine (COMPAZINE) 10 MG tablet Take 1 tablet (10 mg total) by mouth every 6 (six) hours as needed for nausea or vomiting. 30 tablet 2   rivaroxaban (XARELTO) 20 MG TABS tablet Take 1 tablet (20 mg total) by mouth daily with supper. 90 tablet 1   No current facility-administered medications for this visit.    PHYSICAL EXAMINATION: ECOG PERFORMANCE STATUS: 1 - Symptomatic but completely ambulatory  Vitals:   02/01/23 1219  BP: (!) 182/98  Pulse: 92  Resp: 20  Temp: 97.6 F (36.4 C)  SpO2: 98%   Wt Readings from Last 3 Encounters:  02/01/23 117 lb 12.8 oz (53.4 kg)  01/21/23 118 lb (53.5 kg)  01/04/23 122 lb 1.6 oz (55.4 kg)     GENERAL:alert, no distress and comfortable SKIN: skin color normal, no rashes or significant lesions EYES: normal, Conjunctiva are pink and non-injected,  sclera clear  NEURO: alert & oriented x 3 with fluent speech   LABORATORY DATA:  I have reviewed the data as listed    Latest Ref Rng & Units 02/01/2023   11:40 AM 01/04/2023    9:26 AM 11/23/2022    9:39 AM  CBC  WBC 4.0 - 10.5 K/uL 6.4  8.0  5.3   Hemoglobin 12.0 - 15.0 g/dL 12.9  12.5  12.8   Hematocrit 36.0 - 46.0 % 39.0  36.9  38.7   Platelets 150 - 400 K/uL 286  326  285         Latest Ref Rng & Units 02/01/2023   11:40 AM 01/04/2023    9:26 AM 11/23/2022    9:39 AM  CMP  Glucose 70 - 99 mg/dL 139  86  97   BUN 8 - 23 mg/dL 13  12  20   $ Creatinine 0.44 - 1.00 mg/dL 0.93  0.98  0.78   Sodium 135 - 145 mmol/L 141  141  139   Potassium 3.5 - 5.1 mmol/L 3.5  3.1  3.7   Chloride 98 - 111 mmol/L 105  107  106   CO2 22 - 32 mmol/L 27  26  25   $ Calcium 8.9 - 10.3 mg/dL 9.0  9.1  9.3   Total Protein 6.5 - 8.1 g/dL 7.0  6.4  6.7   Total Bilirubin 0.3 - 1.2 mg/dL 0.5  0.4  0.5   Alkaline Phos 38 - 126 U/L 120  116  109   AST 15 - 41 U/L 22  18  16   $ ALT 0 - 44 U/L 10  8  8       $ RADIOGRAPHIC STUDIES: I have personally reviewed the radiological images as listed and agreed with the findings in the report. No results found.    Orders Placed This Encounter  Procedures   CBC with Differential ([place] Only)    Standing Status:   Future    Standing Expiration Date:   03/01/2024   CMP ([place] only)  Standing Status:   Future    Standing Expiration Date:   03/01/2024   Total Protein, Urine dipstick    Standing Status:   Future    Standing Expiration Date:   03/01/2024   CBC with Differential ([place] Only)    Standing Status:   Future    Standing Expiration Date:   03/15/2024   CMP ([place] only)    Standing Status:   Future    Standing Expiration Date:   03/15/2024   All questions were answered. The patient knows to call the clinic with any problems, questions or concerns. No barriers to learning was detected. The total time spent in the  appointment was 30 minutes.     [name], MD 02/01/2023   Felicity Coyer, CMA, am acting as scribe for [name], MD.   I have reviewed the above documentation for accuracy and completeness, and I agree with the above.

## 2023-02-01 NOTE — Patient Instructions (Signed)
Tracy [name]  Discharge Instructions: Thank you for choosing [facility] to provide your oncology and hematology care.   If you have a lab appointment with the [facility], please go directly to the [facility] and check in at the registration area.   Wear comfortable clothing and clothing appropriate for easy access to any Portacath or PICC line.   We strive to give you quality time with your provider. You may need to reschedule your appointment if you arrive late (15 or more minutes).  Arriving late affects you and other patients whose appointments are after yours.  Also, if you miss three or more appointments without notifying the office, you may be dismissed from the clinic at the provider's discretion.      For prescription refill requests, have your pharmacy contact our office and allow 72 hours for refills to be completed.    Today you received the following chemotherapy and/or immunotherapy agents: Bevacizumab, Irinotecan, Leucovorin, Fluorouracil      To help prevent nausea and vomiting after your treatment, we encourage you to take your nausea medication as directed.  BELOW ARE SYMPTOMS THAT SHOULD BE REPORTED IMMEDIATELY: *FEVER GREATER THAN 100.4 F (38 C) OR HIGHER *CHILLS OR SWEATING *NAUSEA AND VOMITING THAT IS NOT CONTROLLED WITH YOUR NAUSEA MEDICATION *UNUSUAL SHORTNESS OF BREATH *UNUSUAL BRUISING OR BLEEDING *URINARY PROBLEMS (pain or burning when urinating, or frequent urination) *BOWEL PROBLEMS (unusual diarrhea, constipation, pain near the anus) TENDERNESS IN MOUTH AND THROAT WITH OR WITHOUT PRESENCE OF ULCERS (sore throat, sores in mouth, or a toothache) UNUSUAL RASH, SWELLING OR PAIN  UNUSUAL VAGINAL DISCHARGE OR ITCHING   Items with * indicate a potential emergency and should be followed up as soon as possible or go to the Emergency Department if any problems should occur.  Please show the CHEMOTHERAPY ALERT  CARD or IMMUNOTHERAPY ALERT CARD at check-in to the Emergency Department and triage nurse.  Should you have questions after your visit or need to cancel or reschedule your appointment, please contact [facility]  Dept: [phone]  and follow the prompts.  Office hours are 8:00 a.m. to 4:30 p.m. Monday - Friday. Please note that voicemails left after 4:00 p.m. may not be returned until the following business day.  We are closed weekends and major holidays. You have access to a nurse at all times for urgent questions. Please call the main number to the clinic Dept: [phone] and follow the prompts.   For any non-urgent questions, you may also contact your provider using MyChart. We now offer e-Visits for anyone 69 and older to request care online for non-urgent symptoms. For details visit [email or web address].   Also download the MyChart app! Go to the app store, search "MyChart", open the app, select [facility], and log in with your MyChart username and password.  Bevacizumab Injection What is this medication? BEVACIZUMAB (be va SIZ yoo mab) treats some types of cancer. It works by blocking a protein that causes cancer cells to grow and multiply. This helps to slow or stop the spread of cancer cells. It is a monoclonal antibody. This medicine may be used for other purposes; ask your health care provider or pharmacist if you have questions. COMMON BRAND NAME(S): Alymsys, Avastin, MVASI, Noah Charon What should I tell my care team before I take this medication? They need to know if you have any of these conditions: Blood clots Coughing up blood Having or  recent surgery Heart failure High blood pressure History of a connection between 2 or more body parts that do not usually connect (fistula) History of a tear in your stomach or intestines Protein in your urine An unusual or allergic reaction to bevacizumab, other medications, foods, dyes, or  preservatives Pregnant or trying to get pregnant Breast-feeding How should I use this medication? This medication is injected into a vein. It is given by your care team in a hospital or clinic setting. Talk to your care team the use of this medication in children. Special care may be needed. Overdosage: If you think you have taken too much of this medicine contact a poison control center or emergency room at once. NOTE: This medicine is only for you. Do not share this medicine with others. What if I miss a dose? Keep appointments for follow-up doses. It is important not to miss your dose. Call your care team if you are unable to keep an appointment. What may interact with this medication? Interactions are not expected. This list may not describe all possible interactions. Give your health care provider a list of all the medicines, herbs, non-prescription drugs, or dietary supplements you use. Also tell them if you smoke, drink alcohol, or use illegal drugs. Some items may interact with your medicine. What should I watch for while using this medication? Your condition will be monitored carefully while you are receiving this medication. You may need blood work while taking this medication. This medication may make you feel generally unwell. This is not uncommon as chemotherapy can affect healthy cells as well as cancer cells. Report any side effects. Continue your course of treatment even though you feel ill unless your care team tells you to stop. This medication may increase your risk to bruise or bleed. Call your care team if you notice any unusual bleeding. Before having surgery, talk to your care team to make sure it is ok. This medication can increase the risk of poor healing of your surgical site or wound. You will need to stop this medication for 28 days before surgery. After surgery, wait at least 28 days before restarting this medication. Make sure the surgical site or wound is healed enough  before restarting this medication. Talk to your care team if questions. Talk to your care team if you may be pregnant. Serious birth defects can occur if you take this medication during pregnancy and for 6 months after the last dose. Contraception is recommended while taking this medication and for 6 months after the last dose. Your care team can help you find the option that works for you. Do not breastfeed while taking this medication and for 6 months after the last dose. This medication can cause infertility. Talk to your care team if you are concerned about your fertility. What side effects may I notice from receiving this medication? Side effects that you should report to your care team as soon as possible: Allergic reactions--skin rash, itching, hives, swelling of the face, lips, tongue, or throat Bleeding--bloody or black, tar-like stools, vomiting blood or brown material that looks like coffee grounds, red or dark brown urine, small red or purple spots on skin, unusual bruising or bleeding Blood clot--pain, swelling, or warmth in the leg, shortness of breath, chest pain Heart attack--pain or tightness in the chest, shoulders, arms, or jaw, nausea, shortness of breath, cold or clammy skin, feeling faint or lightheaded Heart failure--shortness of breath, swelling of the ankles, feet, or hands, sudden weight gain,  unusual weakness or fatigue Increase in blood pressure Infection--fever, chills, cough, sore throat, wounds that don't heal, pain or trouble when passing urine, general feeling of discomfort or being unwell Infusion reactions--chest pain, shortness of breath or trouble breathing, feeling faint or lightheaded Kidney injury--decrease in the amount of urine, swelling of the ankles, hands, or feet Stomach pain that is severe, does not go away, or gets worse Stroke--sudden numbness or weakness of the face, arm, or leg, trouble speaking, confusion, trouble walking, loss of balance or  coordination, dizziness, severe headache, change in vision Sudden and severe headache, confusion, change in vision, seizures, which may be signs of posterior reversible encephalopathy syndrome (PRES) Side effects that usually do not require medical attention (report to your care team if they continue or are bothersome): Back pain Change in taste Diarrhea Dry skin Increased tears Nosebleed This list may not describe all possible side effects. Call your doctor for medical advice about side effects. You may report side effects to FDA at 1-800-FDA-1088. Where should I keep my medication? This medication is given in a hospital or clinic. It will not be stored at home. NOTE: This sheet is a summary. It may not cover all possible information. If you have questions about this medicine, talk to your doctor, pharmacist, or health care provider.  2023 Elsevier/Gold Standard (2022-04-03 00:00:00) Irinotecan Injection What is this medication? IRINOTECAN (ir in oh TEE kan) treats some types of cancer. It works by slowing down the growth of cancer cells. This medicine may be used for other purposes; ask your health care provider or pharmacist if you have questions. COMMON BRAND NAME(S): Camptosar What should I tell my care team before I take this medication? They need to know if you have any of these conditions: Dehydration Diarrhea Infection, especially a viral infection, such as chickenpox, cold sores, herpes Liver disease Low blood cell levels (white cells, red cells, and platelets) Low levels of electrolytes, such as calcium, magnesium, or potassium in your blood Recent or ongoing radiation An unusual or allergic reaction to irinotecan, other medications, foods, dyes, or preservatives If you or your partner are pregnant or trying to get pregnant Breast-feeding How should I use this medication? This medication is injected into a vein. It is given by your care team in a hospital or clinic  setting. Talk to your care team about the use of this medication in children. Special care may be needed. Overdosage: If you think you have taken too much of this medicine contact a poison control center or emergency room at once. NOTE: This medicine is only for you. Do not share this medicine with others. What if I miss a dose? Keep appointments for follow-up doses. It is important not to miss your dose. Call your care team if you are unable to keep an appointment. What may interact with this medication? Do not take this medication with any of the following: Cobicistat Itraconazole This medication may also interact with the following: Certain antibiotics, such as clarithromycin, rifampin, rifabutin Certain antivirals for HIV or AIDS Certain medications for fungal infections, such as ketoconazole, posaconazole, voriconazole Certain medications for seizures, such as carbamazepine, phenobarbital, phenytoin Gemfibrozil Nefazodone St. John's wort This list may not describe all possible interactions. Give your health care provider a list of all the medicines, herbs, non-prescription drugs, or dietary supplements you use. Also tell them if you smoke, drink alcohol, or use illegal drugs. Some items may interact with your medicine. What should I watch for while using this  medication? Your condition will be monitored carefully while you are receiving this medication. You may need blood work while taking this medication. This medication may make you feel generally unwell. This is not uncommon as chemotherapy can affect healthy cells as well as cancer cells. Report any side effects. Continue your course of treatment even though you feel ill unless your care team tells you to stop. This medication can cause serious side effects. To reduce the risk, your care team may give you other medications to take before receiving this one. Be sure to follow the directions from your care team. This medication may  affect your coordination, reaction time, or judgement. Do not drive or operate machinery until you know how this medication affects you. Sit up or stand slowly to reduce the risk of dizzy or fainting spells. Drinking alcohol with this medication can increase the risk of these side effects. This medication may increase your risk of getting an infection. Call your care team for advice if you get a fever, chills, sore throat, or other symptoms of a cold or flu. Do not treat yourself. Try to avoid being around people who are sick. Avoid taking medications that contain aspirin, acetaminophen, ibuprofen, naproxen, or ketoprofen unless instructed by your care team. These medications may hide a fever. This medication may increase your risk to bruise or bleed. Call your care team if you notice any unusual bleeding. Be careful brushing or flossing your teeth or using a toothpick because you may get an infection or bleed more easily. If you have any dental work done, tell your dentist you are receiving this medication. Talk to your care team if you or your partner are pregnant or think either of you might be pregnant. This medication can cause serious birth defects if taken during pregnancy and for 6 months after the last dose. You will need a negative pregnancy test before starting this medication. Contraception is recommended while taking this medication and for 6 months after the last dose. Your care team can help you find the option that works for you. Do not father a child while taking this medication and for 3 months after the last dose. Use a condom for contraception during this time period. Do not breastfeed while taking this medication and for 7 days after the last dose. This medication may cause infertility. Talk to your care team if you are concerned about your fertility. What side effects may I notice from receiving this medication? Side effects that you should report to your care team as soon as  possible: Allergic reactions--skin rash, itching, hives, swelling of the face, lips, tongue, or throat Dry cough, shortness of breath or trouble breathing Increased saliva or tears, increased sweating, stomach cramping, diarrhea, small pupils, unusual weakness or fatigue, slow heartbeat Infection--fever, chills, cough, sore throat, wounds that don't heal, pain or trouble when passing urine, general feeling of discomfort or being unwell Kidney injury--decrease in the amount of urine, swelling of the ankles, hands, or feet Low red blood cell level--unusual weakness or fatigue, dizziness, headache, trouble breathing Severe or prolonged diarrhea Unusual bruising or bleeding Side effects that usually do not require medical attention (report to your care team if they continue or are bothersome): Constipation Diarrhea Hair loss Loss of appetite Nausea Stomach pain This list may not describe all possible side effects. Call your doctor for medical advice about side effects. You may report side effects to FDA at 1-800-FDA-1088. Where should I keep my medication? This medication is given  in a hospital or clinic. It will not be stored at home. NOTE: This sheet is a summary. It may not cover all possible information. If you have questions about this medicine, talk to your doctor, pharmacist, or health care provider.  2023 Elsevier/Gold Standard (2022-04-09 00:00:00) Leucovorin Injection What is this medication? LEUCOVORIN (loo koe VOR in) prevents side effects from certain medications, such as methotrexate. It works by increasing folate levels. This helps protect healthy cells in your body. It may also be used to treat anemia caused by low levels of folate. It can also be used with fluorouracil, a type of chemotherapy, to treat colorectal cancer. It works by increasing the effects of fluorouracil in the body. This medicine may be used for other purposes; ask your health care provider or pharmacist if you  have questions. What should I tell my care team before I take this medication? They need to know if you have any of these conditions: Anemia from low levels of vitamin B12 in the blood An unusual or allergic reaction to leucovorin, folic acid, other medications, foods, dyes, or preservatives Pregnant or trying to get pregnant Breastfeeding How should I use this medication? This medication is injected into a vein or a muscle. It is given by your care team in a hospital or clinic setting. Talk to your care team about the use of this medication in children. Special care may be needed. Overdosage: If you think you have taken too much of this medicine contact a poison control center or emergency room at once. NOTE: This medicine is only for you. Do not share this medicine with others. What if I miss a dose? Keep appointments for follow-up doses. It is important not to miss your dose. Call your care team if you are unable to keep an appointment. What may interact with this medication? Capecitabine Fluorouracil Phenobarbital Phenytoin Primidone Trimethoprim;sulfamethoxazole This list may not describe all possible interactions. Give your health care provider a list of all the medicines, herbs, non-prescription drugs, or dietary supplements you use. Also tell them if you smoke, drink alcohol, or use illegal drugs. Some items may interact with your medicine. What should I watch for while using this medication? Your condition will be monitored carefully while you are receiving this medication. This medication may increase the side effects of 5-fluorouracil. Tell your care team if you have diarrhea or mouth sores that do not get better or that get worse. What side effects may I notice from receiving this medication? Side effects that you should report to your care team as soon as possible: Allergic reactions--skin rash, itching, hives, swelling of the face, lips, tongue, or throat This list may not  describe all possible side effects. Call your doctor for medical advice about side effects. You may report side effects to FDA at 1-800-FDA-1088. Where should I keep my medication? This medication is given in a hospital or clinic. It will not be stored at home. NOTE: This sheet is a summary. It may not cover all possible information. If you have questions about this medicine, talk to your doctor, pharmacist, or health care provider.  2023 Elsevier/Gold Standard (2022-05-05 00:00:00) Fluorouracil Injection What is this medication? FLUOROURACIL (flure oh YOOR a sil) treats some types of cancer. It works by slowing down the growth of cancer cells. This medicine may be used for other purposes; ask your health care provider or pharmacist if you have questions. COMMON BRAND NAME(S): Adrucil What should I tell my care team before I take  this medication? They need to know if you have any of these conditions: Blood disorders Dihydropyrimidine dehydrogenase (DPD) deficiency Infection, such as chickenpox, cold sores, herpes Kidney disease Liver disease Poor nutrition Recent or ongoing radiation therapy An unusual or allergic reaction to fluorouracil, other medications, foods, dyes, or preservatives If you or your partner are pregnant or trying to get pregnant Breast-feeding How should I use this medication? This medication is injected into a vein. It is administered by your care team in a hospital or clinic setting. Talk to your care team about the use of this medication in children. Special care may be needed. Overdosage: If you think you have taken too much of this medicine contact a poison control center or emergency room at once. NOTE: This medicine is only for you. Do not share this medicine with others. What if I miss a dose? Keep appointments for follow-up doses. It is important not to miss your dose. Call your care team if you are unable to keep an appointment. What may interact with this  medication? Do not take this medication with any of the following: Live virus vaccines This medication may also interact with the following: Medications that treat or prevent blood clots, such as warfarin, enoxaparin, dalteparin This list may not describe all possible interactions. Give your health care provider a list of all the medicines, herbs, non-prescription drugs, or dietary supplements you use. Also tell them if you smoke, drink alcohol, or use illegal drugs. Some items may interact with your medicine. What should I watch for while using this medication? Your condition will be monitored carefully while you are receiving this medication. This medication may make you feel generally unwell. This is not uncommon as chemotherapy can affect healthy cells as well as cancer cells. Report any side effects. Continue your course of treatment even though you feel ill unless your care team tells you to stop. In some cases, you may be given additional medications to help with side effects. Follow all directions for their use. This medication may increase your risk of getting an infection. Call your care team for advice if you get a fever, chills, sore throat, or other symptoms of a cold or flu. Do not treat yourself. Try to avoid being around people who are sick. This medication may increase your risk to bruise or bleed. Call your care team if you notice any unusual bleeding. Be careful brushing or flossing your teeth or using a toothpick because you may get an infection or bleed more easily. If you have any dental work done, tell your dentist you are receiving this medication. Avoid taking medications that contain aspirin, acetaminophen, ibuprofen, naproxen, or ketoprofen unless instructed by your care team. These medications may hide a fever. Do not treat diarrhea with over the counter products. Contact your care team if you have diarrhea that lasts more than 2 days or if it is severe and watery. This  medication can make you more sensitive to the sun. Keep out of the sun. If you cannot avoid being in the sun, wear protective clothing and sunscreen. Do not use sun lamps, tanning beds, or tanning booths. Talk to your care team if you or your partner wish to become pregnant or think you might be pregnant. This medication can cause serious birth defects if taken during pregnancy and for 3 months after the last dose. A reliable form of contraception is recommended while taking this medication and for 3 months after the last dose. Talk to your  care team about effective forms of contraception. Do not father a child while taking this medication and for 3 months after the last dose. Use a condom while having sex during this time period. Do not breastfeed while taking this medication. This medication may cause infertility. Talk to your care team if you are concerned about your fertility. What side effects may I notice from receiving this medication? Side effects that you should report to your care team as soon as possible: Allergic reactions--skin rash, itching, hives, swelling of the face, lips, tongue, or throat Heart attack--pain or tightness in the chest, shoulders, arms, or jaw, nausea, shortness of breath, cold or clammy skin, feeling faint or lightheaded Heart failure--shortness of breath, swelling of the ankles, feet, or hands, sudden weight gain, unusual weakness or fatigue Heart rhythm changes--fast or irregular heartbeat, dizziness, feeling faint or lightheaded, chest pain, trouble breathing High ammonia level--unusual weakness or fatigue, confusion, loss of appetite, nausea, vomiting, seizures Infection--fever, chills, cough, sore throat, wounds that don't heal, pain or trouble when passing urine, general feeling of discomfort or being unwell Low red blood cell level--unusual weakness or fatigue, dizziness, headache, trouble breathing Pain, tingling, or numbness in the hands or feet, muscle  weakness, change in vision, confusion or trouble speaking, loss of balance or coordination, trouble walking, seizures Redness, swelling, and blistering of the skin over hands and feet Severe or prolonged diarrhea Unusual bruising or bleeding Side effects that usually do not require medical attention (report to your care team if they continue or are bothersome): Dry skin Headache Increased tears Nausea Pain, redness, or swelling with sores inside the mouth or throat Sensitivity to light Vomiting This list may not describe all possible side effects. Call your doctor for medical advice about side effects. You may report side effects to FDA at 1-800-FDA-1088. Where should I keep my medication? This medication is given in a hospital or clinic. It will not be stored at home. NOTE: This sheet is a summary. It may not cover all possible information. If you have questions about this medicine, talk to your doctor, pharmacist, or health care provider.  2023 Elsevier/Gold Standard (2022-03-31 00:00:00)

## 2023-02-01 NOTE — Progress Notes (Signed)
Around 1548, pt started c/o nausea and feeling "not right".  Tx stopped, IVF started and Dr Burr Medico paged.  Pt given 0.70m atropine per order.  1551- Order placed for additonal 0.553matropine and Dr FeBurr Medicot bedside.  1605 per Dr FeBurr Medicook to resume tx at half rate for 3059m  If pt tolerates, increase back to original run rate.  See MAR and Flowsheets for VS and meds given

## 2023-02-03 ENCOUNTER — Other Ambulatory Visit: Payer: Self-pay

## 2023-02-03 ENCOUNTER — Inpatient Hospital Stay: Payer: Medicare HMO

## 2023-02-03 VITALS — BP 204/95 | HR 95 | Temp 98.2°F | Resp 18

## 2023-02-03 DIAGNOSIS — C19 Malignant neoplasm of rectosigmoid junction: Secondary | ICD-10-CM

## 2023-02-03 MED ORDER — HEPARIN SOD (PORK) LOCK FLUSH 100 UNIT/ML IV SOLN: 500.0000 [IU] | Freq: Once | INTRAVENOUS | Status: DC | PRN

## 2023-02-03 MED ORDER — SODIUM CHLORIDE 0.9% FLUSH: 10.0000 mL | INTRAVENOUS | Status: DC | PRN

## 2023-02-09 ENCOUNTER — Ambulatory Visit (INDEPENDENT_AMBULATORY_CARE_PROVIDER_SITE_OTHER): Payer: Medicare HMO | Admitting: Family Medicine

## 2023-02-09 ENCOUNTER — Encounter: Payer: Self-pay | Admitting: Family Medicine

## 2023-02-09 VITALS — BP 171/91 | HR 83 | Temp 97.9°F | Wt 119.0 lb

## 2023-02-09 DIAGNOSIS — Z933 Colostomy status: Secondary | ICD-10-CM

## 2023-02-09 DIAGNOSIS — Z1231 Encounter for screening mammogram for malignant neoplasm of breast: Secondary | ICD-10-CM

## 2023-02-09 DIAGNOSIS — C799 Secondary malignant neoplasm of unspecified site: Secondary | ICD-10-CM

## 2023-02-09 DIAGNOSIS — Z515 Encounter for palliative care: Secondary | ICD-10-CM

## 2023-02-09 DIAGNOSIS — C19 Malignant neoplasm of rectosigmoid junction: Secondary | ICD-10-CM | POA: Diagnosis not present

## 2023-02-09 DIAGNOSIS — C181 Malignant neoplasm of appendix: Secondary | ICD-10-CM

## 2023-02-09 DIAGNOSIS — I82621 Acute embolism and thrombosis of deep veins of right upper extremity: Secondary | ICD-10-CM

## 2023-02-09 DIAGNOSIS — J449 Chronic obstructive pulmonary disease, unspecified: Secondary | ICD-10-CM

## 2023-02-09 DIAGNOSIS — Z7189 Other specified counseling: Secondary | ICD-10-CM

## 2023-02-09 MED ORDER — LOSARTAN POTASSIUM 50 MG PO TABS: 75.0000 mg | ORAL_TABLET | Freq: Every day | ORAL | 1 refills | Status: DC

## 2023-02-09 NOTE — Patient Instructions (Addendum)
Return in about 12 weeks (around 05/04/2023).        Great to see you today.  I have refilled the medication(s) we provide.   If labs were collected, we will inform you of lab results once received either by echart message or telephone call.   - echart message- for normal results that have been seen by the patient already.   - telephone call: abnormal results or if patient has not viewed results in their echart.

## 2023-02-09 NOTE — Progress Notes (Unsigned)
Patient ID: Tracy [name], female  DOB: Aug 03, 1951, 72 y.o.   MRN: [id number] Patient Care Team    Relationship Specialty Notifications Start End  [name], DO PCP - General Family Medicine  05/21/20   [name], MD Consulting Physician Hematology All results, Admissions 05/07/20   [name], DO Consulting Physician Pulmonary Disease  05/27/20   [facility]    05/27/20   [name], MD Consulting Physician General Surgery  [id number]   [name], MD Consulting Physician General Surgery  05/27/20   [name], [name], MD Consulting Physician Gastroenterology  01/01/21     Chief Complaint  Patient presents with   pallative care     terminal cancer     Subjective: Tracy [name] is a 72 y.o.  Female  present to discuss ongoing care and resources for palliative care/hospice secondary to her terminal cancer diagnoses.   Colon cancer: [place] has colon cancer has metastasized and unfortunately is now considered terminal.  She may or may not undergo more chemotherapy, she has not decided as of yet.  She states they have given her 6-9 months to live.  She would like to move forward with setting up palliative care and discuss end-of-life goals/desires.  She reports overall she feels okay. Still thinking about DNR/DNI. She has concerns over her severe COPD and treatment plan/options that she has to her as her illnesses progress. She reports she is still eating well, even has gained a little weight. She is looking forward to spending as much time as possible with her family, especially her grandchildren and the time that she has left.     08/07/2022    1:22 PM 01/01/2021    9:59 AM 10/08/2020    8:26 AM 07/02/2020   11:09 AM  Depression screen PHQ 2/9  Decreased Interest 0 0 0 0  Down, Depressed, Hopeless 0 0 0 0  PHQ - 2 Score 0 0 0 0  Altered sleeping    0  Tired, decreased energy    0  Change in appetite    0  Feeling bad or failure about  yourself     0  Trouble concentrating    0  Moving slowly or fidgety/restless    0  Suicidal thoughts    0  PHQ-9 Score    0  Difficult doing work/chores    Not difficult at all       No data to display          Immunization History  Administered Date(s) Administered   Fluad Quad(high Dose 65+) 10/27/2019, 10/08/2020   Influenza Split 01/06/2013, 09/13/2013   Influenza, High Dose Seasonal PF 10/10/2018   Influenza,inj,Quad PF,6+ Mos 09/13/2014, 10/15/2015   Influenza,inj,Quad PF,6-35 Mos 10/26/2016   Moderna Sars-Covid-2 Vaccination 01/09/2020, 02/06/2020, 10/10/2020   Pneumococcal Conjugate-13 07/12/2014, 01/10/2021   Pneumococcal Polysaccharide-23 01/06/2013, 10/10/2018   Tdap 05/15/2013   Zoster, Live 07/14/2013     Past Medical History:  Diagnosis Date   Acute deep vein thrombosis (DVT) of brachial vein of right upper extremity (HCC)    Acute on chronic respiratory failure with hypoxia ([place])    Asthma    Cataract    CHICKENPOX, HX OF 01/06/2011   Qualifier: Diagnosis of  By: Charlett Blake MD, Stacey     Closed fracture of fifth metatarsal bone 08/10/2022   Colon cancer (HCC)    COPD (chronic obstructive pulmonary disease) ([place]) 2011   FeV1 31% predicted FeV1/FVX 47 %  COVID-19 05/2021   Essential hypertension 05/23/2020   Hemorrhoid    HIP PAIN, LEFT 01/06/2011   Qualifier: Diagnosis of   By: Charlett Blake MD, Stacey       Open right radial fracture 2020   Osteopenia    Perforated sigmoid colon ([place]) 03/03/2020   Pleural effusion    Pneumonia 2021   Postoperative intra-abdominal abscess 2021   Rectal cancer (HCC)    Seasonal allergies    takes Claritin daily prn   Shock circulatory ([place])    Tracheostomy status ([place])    Vitamin D deficiency    takes Vit d every 14 days   Allergies  Allergen Reactions   Amlodipine     Leg swelling.    Codeine Swelling   Past Surgical History:  Procedure Laterality Date   AUGMENTATION MAMMAPLASTY     saline   EXAMINATION UNDER  ANESTHESIA  10/14/2012   Procedure: EXAM UNDER ANESTHESIA;  Surgeon: Gayland Curry, MD,FACS;  Location: [facility];  Service: General;  Laterality: N/A;  rectal exam under anesthesia excisional hemorrhoidectomy hemorrhoidal banding x two   EXAMINATION UNDER ANESTHESIA  02/03/2013   excision hemorrhoidal tissue   FOOT SURGERY     left bunionectomy   HEMORRHOIDECTOMY WITH HEMORRHOID BANDING  10/14/2012   Procedure: HEMORRHOIDECTOMY WITH HEMORRHOID BANDING;  Surgeon: Gayland Curry, MD,FACS;  Location: [facility];  Service: General;  Laterality: N/A;   IR IMAGING GUIDED PORT INSERTION  02/03/2021   IR THORACENTESIS ASP PLEURAL SPACE W/IMG GUIDE  04/23/2020   LAPAROTOMY N/A 03/03/2020   Procedure: low anterior resection end colostomy;  Surgeon: [name], MD;  Location: [facility];  Service: General;  Laterality: N/A;   LAPAROTOMY N/A 03/12/2020   Procedure: EXPLORATORY LAPAROTOMY WITH BOWEL RESECTION AND COLOSTOMY;  Surgeon: [name], MD;  Location: [facility];  Service: General;  Laterality: N/A;   OPEN REDUCTION INTERNAL FIXATION (ORIF) DISTAL RADIAL FRACTURE Right 11/01/2018   Procedure: OPEN REDUCTION INTERNAL FIXATION (ORIF) DISTAL RADIAL FRACTURE;  Surgeon: [name], MD;  Location: [facility];  Service: Orthopedics;  Laterality: Right;   TONSILLECTOMY  1960   recurrent otitis media   US ECHOCARDIOGRAPHY  02/2020    poor windows, normal LV function, severely dilated RV with moderately reduced function, RV volume and pressure overload, mildly dilated RA   Family History  Problem Relation Age of Onset   Osteoporosis Mother    Other Father        [place] in snow, broke ankle, blood clot to lungs   Heart murmur Sister    Cancer Sister        leukemia    Other Brother        h/o being hit by lightening   Parkinsonism Brother    Asthma Son    Allergies Son    Otitis media Son    Other Sister        CML   Asthma Son    Allergies Son    Pancreatic cancer Brother 83       diag  Dec 27, 2013-deceased 2014-02-26   Prostate cancer Brother    Prostate cancer Paternal Uncle    Colon cancer Neg Hx    Rectal cancer Neg Hx    Stomach cancer Neg Hx    Social History   Social History Narrative   Marital status/children/pets: Married.  2 children.   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   Safety:      -smoke alarm in the home:Yes     -  wears seatbelt: Yes     - Feels safe in their relationships: Yes    Allergies as of 02/09/2023       Reactions   Amlodipine    Leg swelling.    Codeine Swelling        Medication List        Accurate as of February 09, 2023 11:59 PM. If you have any questions, ask your nurse or doctor.          STOP taking these medications    ALPRAZolam 0.25 MG tablet Commonly known as: XANAX Stopped by: Howard Pouch, DO   capecitabine 500 MG tablet Commonly known as: XELODA Stopped by: Howard Pouch, DO   docusate sodium 50 MG capsule Commonly known as: COLACE Stopped by: Howard Pouch, DO   lidocaine-prilocaine cream Commonly known as: EMLA Stopped by: Howard Pouch, DO   prochlorperazine 10 MG tablet Commonly known as: COMPAZINE Stopped by: Howard Pouch, DO       TAKE these medications    acetaminophen 325 MG tablet Commonly known as: Tylenol Take 2 tablets (650 mg total) by mouth every 6 (six) hours as needed for mild pain.   albuterol (2.5 MG/3ML) 0.083% nebulizer solution Commonly known as: PROVENTIL Take 3 mLs (2.5 mg total) by nebulization every 4 (four) hours as needed for wheezing or shortness of breath. DX: J44.9   albuterol 108 (90 Base) MCG/ACT inhaler Commonly known as: VENTOLIN HFA Inhale 2 puffs into the lungs every 6 (six) hours as needed for wheezing or shortness of breath.   fluticasone 50 MCG/ACT nasal spray Commonly known as: FLONASE SPRAY 2 SPRAYS INTO EACH NOSTRIL EVERY DAY   gabapentin 100 MG capsule Commonly known as: Neurontin Take 1 capsule (100 mg total) by mouth at  bedtime. OK to increase by '100mg'$  every week until '300mg'$  for left thigh pain.   HYDROcodone bit-homatropine 5-1.5 MG/5ML syrup Commonly known as: HYCODAN Take 5 mLs by mouth every 6 (six) hours as needed for cough.   loratadine 10 MG tablet Commonly known as: CLARITIN Take 1 tablet (10 mg total) by mouth daily.   losartan 50 MG tablet Commonly known as: COZAAR Take 1.5 tablets (75 mg total) by mouth daily. What changed:  medication strength how much to take Changed by: Howard Pouch, DO   ondansetron 8 MG tablet Commonly known as: ZOFRAN Take 1 tablet (8 mg total) by mouth every 8 (eight) hours as needed for nausea or vomiting.   rivaroxaban 20 MG Tabs tablet Commonly known as: Xarelto Take 1 tablet (20 mg total) by mouth daily with supper.   Trelegy Ellipta 100-62.5-25 MCG/ACT Aepb Generic drug: Fluticasone-Umeclidin-Vilant Inhale 1 puff into the lungs daily.        All past medical history, surgical history, allergies, family history, immunizations andmedications were updated in the EMR today and reviewed under the history and medication portions of their EMR.       ROS: 14 pt review of systems performed and negative (unless mentioned in an HPI)  Objective: BP (!) 171/91 (BP Location: Left Arm)   Pulse 83   Temp 97.9 F (36.6 C)   Wt 119 lb (54 kg)   LMP 12/14/2000   SpO2 94%   BMI 23.24 kg/m  Physical Exam Vitals and nursing note reviewed.  Constitutional:      General: She is not in acute distress.    Appearance: Normal appearance. She is not ill-appearing, toxic-appearing or diaphoretic.     Comments: Pleasant female.  HENT:  Head: Normocephalic and atraumatic.  Eyes:     General: No scleral icterus.       Right eye: No discharge.        Left eye: No discharge.     Extraocular Movements: Extraocular movements intact.     Conjunctiva/sclera: Conjunctivae normal.     Pupils: Pupils are equal, round, and reactive to light.  Cardiovascular:     Rate  and Rhythm: Normal rate and regular rhythm.  Pulmonary:     Effort: Pulmonary effort is normal. No respiratory distress.     Breath sounds: Normal breath sounds. No wheezing, rhonchi or rales.  Musculoskeletal:     Right lower leg: No edema.     Left lower leg: No edema.  Skin:    General: Skin is warm.     Findings: No rash.  Neurological:     Mental Status: She is alert and oriented to person, place, and time. Mental status is at baseline.     Motor: No weakness.     Gait: Gait normal.  Psychiatric:        Attention and Perception: Attention and perception normal.        Mood and Affect: Mood normal. Mood is not anxious or depressed. Affect is not angry or inappropriate.        Speech: Speech normal.        Behavior: Behavior normal. Behavior is cooperative.        Thought Content: Thought content normal. Thought content is not paranoid or delusional.        Cognition and Memory: Cognition and memory normal.        Judgment: Judgment normal.     No results found.  Assessment/plan: Tracy [name] is a 72 y.o. 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 We discussed palliative care referral today versus hospice referral.  Placed palliative care referral for now and she may need to transition into hospice soon. She reports she has the terms with the diagnosis, and plans on spending as much time enjoying her family and her grandbabies that she can. - Amb Referral to Palliative Care  Hypertension: Patient's blood pressure has been running high per EMR review.   Increase losartan to 75 mg daily today.  Acute deep vein thrombosis (DVT) of brachial vein of right upper extremity ([place]) She is compliant  with Xarelto, prescribed by oncology team  COPD, severe [facility]) Patient has prescription for albuterol inhaler and nebulizer as needed.,  She uses Flonase as needed. She is prescribed Trelegy Ellipta She sometimes needs the Hycodan cough syrup to help her with her cough. If she experiences respiratory symptoms she should contact her pulmonology team first, however if they are unable to work her and I assured her we would do our very best and even try to see her virtually if needed. Doxycycline seem to work very well for her last time . Return in about 12 weeks (around 05/04/2023).   Orders Placed This Encounter  Procedures   Amb Referral to Palliative Care   Meds ordered this encounter  Medications   losartan (COZAAR) 50 MG tablet    Sig: Take 1.5 tablets (75 mg total) by mouth daily.    Dispense:  135 tablet    Refill:  1  Referral Orders         Amb Referral to Palliative Care       Electronically signed by: Howard Pouch, DO [place]

## 2023-02-10 ENCOUNTER — Other Ambulatory Visit: Payer: Self-pay

## 2023-02-11 ENCOUNTER — Encounter: Payer: Self-pay | Admitting: Family Medicine

## 2023-02-12 MED FILL — Dexamethasone Sodium Phosphate Inj 100 MG/10ML: INTRAMUSCULAR | Qty: 1 | Status: AC

## 2023-02-12 NOTE — Progress Notes (Unsigned)
Tracy [name]   [phone] [phone]   Clinic Follow up Note   Patient Care Team: [name], DO as PCP - General (Family Medicine) [name], MD as Consulting Physician (Hematology) Icard, Octavio Graves, DO as Consulting Physician (Pulmonary Disease) [place], [facility].A. [name], MD as Consulting Physician (General Surgery) [name], MD as Consulting Physician (General Surgery) [name], [name], MD as Consulting Physician (Gastroenterology)  Date of Service:  02/15/2023  CHIEF COMPLAINT: f/u of colorectal cancer    CURRENT THERAPY: FOLFIRI+BEVAACIZUMAB start 02/01/2023      ASSESSMENT:  Tracy [name] is a 72 y.o. 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 Path showed invasive adenocarcinoma with perforation, +3/15 LNs, and clear margins. Initial CT scan negative for distant metastasis. She was not a candidate for adjuvant chemo due to very slow recovery from surgery -she had recurrence in peritoneum with invasion to appendiceal orifice.  -She began Xeloda on 02/01/21, subsequently change to CAPOX with dose redution and beva, she had good response and stayed on it for 2 years  --Her PET scan from January 21, 2023 showed overall disease progression comparing to PET from January 2023.  I changed her chemotherapy to FOLFIRI and beva, started on 02/01/2023 -she tolerated first cycle well with mild diarrhea  -will continue at current dose     COPD, severe ([place]) -continue meds -f/u with pulmonary   Personal history of venous thrombosis and embolism -incidental finding on CT 08/14/21 -no hypoxia, stable respiratory status. She began xarelto 08/15/21 -Beva was held through 12/28/21  HTN -new lately, losartan dose increased by her PCP last week -will monitor closely at home    PLAN: -encourage the to  monitor her BP at home. -lab reviewed -proceed with C2 folfiri +beva at same reduce dose -lab/flush,f/u and folfiri +beva  03/01/2023    SUMMARY OF ONCOLOGIC HISTORY: Oncology History Overview Note  Cancer Staging Rectal cancer [facility]) Staging form: Colon and Rectum, AJCC 8th Edition - Pathologic stage from 03/03/2020: Stage IIIB (pT4a, pN1b, cM0) - Signed by [name], MD on 01/28/2021 Stage prefix: Initial diagnosis Histologic grading system: 4 grade system Histologic grade (G): G2 Residual tumor (R): R0 - None    Rectosigmoid cancer ([place])  03/03/2020 - 04/05/2020 Hospital Admission   She was admitted to ED on 03/03/20 for abdominal pain and nausea. She had been having diarrhea for several weeks. During hospital stay she developed acute respiratory failure, AKI, RUE DVT from PICC line. Work up showed bowel perforation, small left liver mass and thickening of jejunum. She underwent emergent surgery on 03/03/20 for resection and colostomy placement. Her path showed invasive cancer, metastatic to 3/15 LNs. She had a NGT in placed but this was removed on POD 3. Post op her stoma became necrotic and septic. She had another bowel surgery on 03/12/20, which was NED with necrotic tissue.    03/03/2020 Imaging   CT AP 03/03/20  IMPRESSION: 1. Free intraperitoneal air consistent perforation. Most likely site of perforation is in the distal splenic flexure/proximal descending colon where there is a collection of air and gas measuring 6 centimeters. No obvious soft tissue mass identified in this region. At this site, there is abrupt transition of dilated, stool-filled colon to completely decompressed proximal descending colon. 2. Thickened, inflamed loops of jejunum are identified within the pelvis and are likely reactive. 3. Small hiatal hernia. 4.  Benign-appearing 1.6 centimeter mass the LEFT hepatic lobe. Recommend comparison with prior studies if available. 5.  Emphysema (ICD10-J43.9). 6.  Aortic  Atherosclerosis (ICD10-I70.0). 7. Bilateral renal scarring.   03/03/2020 Surgery   low anterior resection end colostomy by Dr Marcello Moores    03/03/2020 Initial Biopsy   FINAL MICROSCOPIC DIAGNOSIS: 03/03/20 A. RECTOSIGMOID COLON, LOW ANTERIOR RESECTION:  - Invasive colonic adenocarcinoma, 3.5 cm.  - Tumor invades the visceral peritoneum.  - Margins of resection are not involved.  - Metastatic carcinoma in (3) of (15) lymph nodes.  - See oncology table.   B. ADDITIONAL SIGMOID COLON, RESECTION:  - Colonic tissue, negative for carcinoma.  ADDENDUM:  Mismatch Repair Protein (IHC)   SUMMARY INTERPRETATION: NORMAL  There is preserved expression of the major MMR proteins. There is a very  low probability that microsatellite instability (MSI) is present.  However, certain clinically significant MMR protein mutations may result  in preservation of nuclear expression. It is recommended that the  preservation of protein expression be correlated with molecular based  MSI testing.   IHC EXPRESSION RESULTS  TEST           RESULT  MLH1:          Preserved nuclear expression  MSH2:          Preserved nuclear expression  MSH6:          Preserved nuclear expression  PMS2:          Preserved nuclear expression   03/03/2020 Cancer Staging   Staging form: Colon and Rectum, AJCC 8th Edition - Pathologic stage from 03/03/2020: Stage IIIB (pT4a, pN1b, cM0) - Signed by [name], MD on 01/28/2021 Stage prefix: Initial diagnosis Histologic grading system: 4 grade system Histologic grade (G): G2 Residual tumor (R): R0 - None   03/08/2020 Imaging   CT AP 03/08/20 IMPRESSION: 1. Interval midline laparotomy with distal colon resection and diverting left lower quadrant colostomy. 2. Diffuse small bowel dilatation with gas fluid levels most consistent with postoperative ileus. 3. Trace ascites within the abdomen and pelvis. No fluid collection or abscess at this time. Surgical drain within the lower  pelvis. 4. Indeterminate 1.4 cm subcapsular liver hypodensity. In light of newly diagnosed rectal cancer, metastatic disease cannot be excluded. PET CT may be useful for further evaluation. 5. Interval development of trace bilateral pleural effusions and diffuse body wall edema.   03/12/2020 Surgery   EXPLORATORY LAPAROTOMY WITH BOWEL RESECTION AND COLOSTOMY by Dr Hassell Done 03/12/20  FINAL MICROSCOPIC DIAGNOSIS: 03/12/20  A. COLON, SPLENIC FLEXURE, RESECTION:  - Segment of colon (37 cm) with perforation and associated inflammation  - Multiple mucosal ulcers with necrotizing inflammation  - No evidence of malignancy    03/12/2020 Imaging   CT AP WO contrast 03/12/20  IMPRESSION: 1. Exam is limited by lack of intravenous contrast, motion, and artifact from the patient's arms adjacent to the torso. Since 03/08/2020, there has been development of relatively large pockets of intraperitoneal free air. While intraperitoneal gas would not be unexpected on postoperative day 9, this gas is new since an intervening study of 03/08/2020 and given the relatively large volume certainly raises concern for bowel perforation. No source for the intraperitoneal free air is evident on this study. There is a surgical drain in the pelvis, but there is no free gas around the drain itself to suggest that it represents the source. 2. New circumferential wall thickening in the splenic flexure, descending colon and sigmoid colon leading into the end colostomy.  Infection/inflammation would be a consideration. Ischemia cannot be excluded. 3. Relatively small volume intraperitoneal free fluid. High attenuation small fluid collections in the left upper abdomen may reflect hemorrhage, infection or residua from prior perforation. 4. Interval progression of diffuse body wall edema. 5. Residual contrast material in the renal parenchyma from prior imaging, compatible with renal dysfunction.    03/19/2020 Imaging   CT  CAP 03/19/20  IMPRESSION: 1. 5.5 x 3.2 cm fluid collection is noted along the greater curvature of the proximal stomach. 2. Surgical drain is again noted in the pelvis with tip in left lower quadrant. 3. Interval development of crescent-shaped fluid collection measuring 16.4 x 3.3 cm in the epigastric region and left upper quadrant of the abdomen which may extend into the left lower quadrant. Potentially this may represent abscess or developing abscess. Multiple other smaller fluid collections are noted which may represent small abscesses. 4. 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 5. Mild amount of free fluid is noted in the posterior pelvis. 6. Moderate anasarca is noted. Aortic Atherosclerosis (ICD10-I70.0).   04/12/2020 Imaging   CT AP 04/12/20  IMPRESSION: 1. Fluid collections within the LEFT abdomen have significantly decreased compared to previous CT exams, now nearly completely resolved. 2. Percutaneous drainage catheter with tip coiled posterior to the LEFT kidney, stable positioning compared to the previous study. The more anterior catheter has been removed. 3. Small amount of free fluid persists within the abdomen and pelvis. 4. Trace bibasilar pleural effusions. 5. Anasarca. 6. While reviewing today's study, comparing with a chest/abdomen/pelvis CT from earlier same month, there is question of thrombus in the LEFT internal jugular vein. Recommend ultrasound of the LEFT IJ to exclude DVT. This recommendation discussed with patient's hospitalist, Dr. Owens Shark, on 04/12/2020 at 4:20 p.m.   Emphysema (ICD10-J43.9).   04/22/2020 Imaging   CT AP 04/22/20  IMPRESSION: 1. No recurrent intra-abdominal abscess status post interval removal of left retroperitoneal percutaneous drain. Stable small amount of free pelvic fluid. 2. Enlarging dependent bilateral pleural effusions, now moderate in volume. Associated atelectasis at both lung bases. 3. Progressive anasarca with  generalized edema throughout the subcutaneous fat. 4. Stable small subcapsular fluid collection along the anterior aspect of the left hepatic lobe. 5. Aortic Atherosclerosis (ICD10-I70.0).   05/23/2020 Initial Diagnosis   Rectal cancer ([place])   01/14/2021 Imaging   CT CAP IMPRESSION: -5.0 cm soft tissue mass along the posterior aspect of the cecum at the base of the appendix, favored to reflect a peritoneal/serosal implant, corresponding to the patient's known adenocarcinoma. This is new from the prior. -Suspected additional pelvic implants along the uterus and right adnexa, poorly visualized, new/progressive from the prior. -Additional pericapsular lesion along the lateral spleen is mildly progressive, suspicious for additional peritoneal implant. -No evidence of metastatic disease in the chest.   01/27/2021 PET scan   IMPRESSION:  1. Extensive hypermetabolic peritoneal metastasis, as detailed  above.  2. No evidence of hypermetabolic supradiaphragmatic disease.  3. Diffuse low-level thyroid hypermetabolism can be seen in the  setting of thyroiditis. Consider correlation with thyroid function  labs.  4. Aortic atherosclerosis (ICD10-I70.0) and emphysema (ICD10-J43.9).    02/01/2021 -  Chemotherapy    First-line chemo Xeloda 1500 mg twice daily for day 1-14, every 21 days, started on 02/01/2021. Reduced to 1 week on/ 1 week off and added bevacizumab q2weeks from C2 on 02/24/21.  ----Given good tolerance, I changed to CAPOX and bevacizumab q2weeks from C4 on 03/24/21 with Xeloda '1500mg'$  in the AM  and '1000mg'$  in the PM 1 week on/1 week off.     03/24/2021 - 01/04/2023 Chemotherapy   Patient is on Treatment Plan : COLORECTAL CapeOx + Bevacizumab q21d     05/08/2021 Imaging   PET  IMPRESSION: 1. There is been interval development of several FDG avid pulmonary nodules within both lungs. Additionally, there is new FDG avid low left paratracheal lymph node. Thoracic metastasis cannot  be excluded. 2. Interval improvement in multifocal FDG avid peritoneal metastasis as detailed above.     08/14/2021 Imaging   CT CAP  IMPRESSION: Resolution of previously seen small bilateral pulmonary nodules since previous study.   Stable 8 mm mediastinal lymph node in the AP window.   No evidence of metastatic disease within the abdomen or pelvis.   Nonocclusive pulmonary embolism in bilateral central lower lobe pulmonary arteries, which appears subacute in age.   12/25/2021 Imaging   EXAM: CT CHEST, ABDOMEN, AND PELVIS WITH CONTRAST  IMPRESSION: 1. Minimal, bandlike residua of previously seen FDG avid pulmonary nodules, consistent with treated metastases. No new pulmonary nodules. 2. Unchanged post treatment appearance of the cecal base, with soft tissue thickening about the cecal base and adjacent pelvis, previously FDG avid and in keeping with known appendiceal orifice malignancy. 3. Unchanged central fluid attenuation lesions about the spleen, left iliopsoas, and left pelvic sidewall, previously FDG avid, consistent with treated peritoneal implants. 4. No evidence of new metastatic disease in the chest, abdomen, or pelvis 5. Status post rectosigmoid colon resection with left lower quadrant end colostomy.   Aortic Atherosclerosis (ICD10-I70.0).   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 - Polypoid lesion obliterating appendiceal orifice. Biopsied to evaluate for malignancy. - The examined portion of the ileum was normal. - Two large polyps in the cecum, not removed today pending path at appendiceal orifice, bowel prep. If removed endoscopically in the future would favor EMR to be done at the hospital. - One  5 mm polyp at the ileocecal valve, removed with a cold snare. Resected and retrieved. - One 3 mm polyp in the transverse colon, removed with a cold snare. Resected and retrieved. - Parastomal hernia making initial entry to the distal colon challenging. - The examination was otherwise normal.   01/06/2021 Initial Biopsy   Diagnosis 1. Colon, biopsy, appendiceal oraface - ADENOCARCINOMA. 2. Colon, polyp(s), ileocecal valve, transverse, x2 - TUBULAR ADENOMA, NEGATIVE FOR HIGH GRADE DYSPLASIA (X1). - COLONIC MUCOSA WITH UNDERLYING LYMPHOID AGGREGATE, NEGATIVE FOR DYSPLASIA (X1).  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 This is new from the prior. -Suspected additional pelvic implants along the uterus and right adnexa, poorly visualized, new/progressive from the prior. -Additional pericapsular lesion along the lateral spleen is mildly progressive, suspicious for additional peritoneal implant. -No evidence of metastatic disease in the  chest.   08/14/2021 Imaging   CT CAP IMPRESSION: Resolution of previously seen small bilateral pulmonary nodules since previous study. Stable 8 mm mediastinal lymph node in the AP window. No evidence of metastatic disease within the abdomen or pelvis. Nonocclusive pulmonary embolism in bilateral central lower lobe pulmonary arteries, which appears subacute in age.  Aortic Atherosclerosis (ICD10-I70.0) and Emphysema (ICD10-J43.9).     08/14/2021 Imaging   CT CAP  IMPRESSION: Resolution of previously  seen small bilateral pulmonary nodules since previous study.   Stable 8 mm mediastinal lymph node in the AP window.   No evidence of metastatic disease within the abdomen or pelvis.   Nonocclusive pulmonary embolism in bilateral central lower lobe pulmonary arteries, which appears subacute in age.   12/25/2021 Imaging   EXAM: CT CHEST, ABDOMEN, AND PELVIS WITH CONTRAST  IMPRESSION: 1. Minimal, bandlike residua of previously seen FDG avid pulmonary nodules, consistent with treated metastases. No new pulmonary nodules. 2. Unchanged post treatment appearance of the cecal base, with soft tissue thickening about the cecal base and adjacent pelvis, previously FDG avid and in keeping with known appendiceal orifice malignancy. 3. Unchanged central fluid attenuation lesions about the spleen, left iliopsoas, and left pelvic sidewall, previously FDG avid, consistent with treated peritoneal implants. 4. No evidence of new metastatic disease in the chest, abdomen, or pelvis 5. Status post rectosigmoid colon resection with left lower quadrant end colostomy.   Aortic Atherosclerosis (ICD10-I70.0).      INTERVAL HISTORY:  Tracy [name] is here for a follow up of  colorectal cancer  She was last seen by me on 02/01/2023 She presents to the clinic alone. Pt states she didn't take her BP medication this morning. Pt went to see her PCP and she increase her dosage on her BP medication due to hypertension. Pt reported she had no issues with last treatment. Pt stated her diarrhea was running but its tolerable to 2-3 times a day. Pt states she takes Pedialyte to replace are electrolytes.      All other systems were reviewed with the patient and are negative.  MEDICAL HISTORY:  Past Medical History:  Diagnosis Date   Acute deep vein thrombosis (DVT) of brachial vein of right upper extremity (HCC)    Acute on chronic respiratory failure with hypoxia (HCC)    Asthma    Cataract    CHICKENPOX, HX  OF 01/06/2011   Qualifier: Diagnosis of  By: Charlett Blake MD, [place]     Closed fracture of fifth metatarsal bone 08/10/2022   Colon cancer (HCC)    COPD (chronic obstructive pulmonary disease) ([place]) 2011   FeV1 31% predicted FeV1/FVX 47 %   COVID-19 05/2021   Essential hypertension 05/23/2020   Hemorrhoid    HIP PAIN, LEFT 01/06/2011   Qualifier: Diagnosis of   By: Charlett Blake MD, St[place]       Open right radial fracture 2020   Osteopenia    Perforated sigmoid colon ([place]) 03/03/2020   Pleural effusion    Pneumonia 2021   Postoperative intra-abdominal abscess 2021   Rectal cancer (HCC)    Seasonal allergies    takes Claritin daily prn   Shock circulatory ([place])    Tracheostomy status ([place])    Vitamin D deficiency    takes Vit d every 14 days    SURGICAL HISTORY: Past Surgical History:  Procedure Laterality Date   AUGMENTATION MAMMAPLASTY     saline   EXAMINATION UNDER ANESTHESIA  10/14/2012   Procedure: EXAM UNDER ANESTHESIA;  Surgeon: Gayland Curry, MD,FACS;  Location: [facility];  Service: General;  Laterality: N/A;  rectal exam under anesthesia excisional hemorrhoidectomy hemorrhoidal banding x two   EXAMINATION UNDER ANESTHESIA  02/03/2013   excision hemorrhoidal tissue   FOOT SURGERY     left bunionectomy   HEMORRHOIDECTOMY WITH HEMORRHOID BANDING  10/14/2012   Procedure: HEMORRHOIDECTOMY WITH HEMORRHOID BANDING;  Surgeon: Gayland Curry, MD,FACS;  Location: [facility];  Service: General;  Laterality: N/A;   IR IMAGING GUIDED PORT INSERTION  02/03/2021   IR THORACENTESIS ASP PLEURAL SPACE W/IMG GUIDE  04/23/2020   LAPAROTOMY N/A 03/03/2020   Procedure: low anterior resection end colostomy;  Surgeon: [name], MD;  Location: [facility];  Service: General;  Laterality: N/A;   LAPAROTOMY N/A 03/12/2020   Procedure: EXPLORATORY LAPAROTOMY WITH BOWEL RESECTION AND COLOSTOMY;  Surgeon: [name], MD;  Location: [facility];  Service: General;  Laterality: N/A;   OPEN REDUCTION INTERNAL FIXATION (ORIF)  DISTAL RADIAL FRACTURE Right 11/01/2018   Procedure: OPEN REDUCTION INTERNAL FIXATION (ORIF) DISTAL RADIAL FRACTURE;  Surgeon: [name], MD;  Location: [facility];  Service: Orthopedics;  Laterality: Right;   TONSILLECTOMY  1960   recurrent otitis media   US ECHOCARDIOGRAPHY  02/2020    poor windows, normal LV function, severely dilated RV with moderately reduced function, RV volume and pressure overload, mildly dilated RA    I have reviewed the social history and family history with the patient and they are unchanged from previous note.  ALLERGIES:  is allergic to amlodipine and codeine.  MEDICATIONS:  Current Outpatient Medications  Medication Sig Dispense Refill   acetaminophen (TYLENOL) 325 MG tablet Take 2 tablets (650 mg total) by mouth every 6 (six) hours as needed for mild pain. 30 tablet 0   albuterol (PROVENTIL) (2.5 MG/3ML) 0.083% nebulizer solution Take 3 mLs (2.5 mg total) by nebulization every 4 (four) hours as needed for wheezing or shortness of breath. DX: J44.9 360 mL 5   albuterol (VENTOLIN HFA) 108 (90 Base) MCG/ACT inhaler Inhale 2 puffs into the lungs every 6 (six) hours as needed for wheezing or shortness of breath. 8 g 5   fluticasone (FLONASE) 50 MCG/ACT nasal spray SPRAY 2 SPRAYS INTO EACH NOSTRIL EVERY DAY 48 mL 0   Fluticasone-Umeclidin-Vilant (TRELEGY ELLIPTA) 100-62.5-25 MCG/ACT AEPB Inhale 1 puff into the lungs daily. 2 each 0   gabapentin (NEURONTIN) 100 MG capsule Take 1 capsule (100 mg total) by mouth at bedtime. OK to increase by '100mg'$  every week until '300mg'$  for left thigh pain. 60 capsule 0   HYDROcodone bit-homatropine (HYCODAN) 5-1.5 MG/5ML syrup Take 5 mLs by mouth every 6 (six) hours as needed for cough. (Patient not taking: Reported on 11/23/2022) 240 mL 0   loratadine (CLARITIN) 10 MG tablet Take 1 tablet (10 mg total) by mouth daily. 90 tablet 3   losartan (COZAAR) 50 MG tablet Take 1.5 tablets (75 mg total) by mouth daily. 135  tablet 1   ondansetron (ZOFRAN) 8 MG tablet Take 1 tablet (8 mg total) by mouth every 8 (eight) hours as needed for nausea or vomiting. 20 tablet 2   rivaroxaban (XARELTO) 20 MG TABS tablet Take 1 tablet (20 mg total) by mouth daily with supper. 90 tablet 1   No current facility-administered medications for this visit.    PHYSICAL EXAMINATION: ECOG PERFORMANCE STATUS: 1 - Symptomatic but completely ambulatory  Vitals:   02/15/23 0814  BP: (!) 190/68  Pulse: 68  Resp: 20  Temp: (!) 97.5 F (36.4 C)  SpO2: 97%   Wt Readings from Last 3 Encounters:  02/15/23 118 lb 1.6 oz (53.6 kg)  02/09/23 119 lb (54 kg)  02/01/23 117 lb 12.8 oz (53.4 kg)     GENERAL:alert, no distress and comfortable SKIN: skin color normal, no rashes or significant lesions EYES: normal, Conjunctiva are pink and non-injected, sclera clear  NEURO: alert & oriented x 3 with fluent speech   LABORATORY DATA:  I have reviewed the data as listed    Latest Ref Rng & Units 02/15/2023    7:44 AM 02/01/2023   11:40 AM 01/04/2023    9:26 AM  CBC  WBC 4.0 - 10.5 K/uL 4.4  6.4  8.0   Hemoglobin 12.0 - 15.0 g/dL 11.7  12.9  12.5   Hematocrit 36.0 - 46.0 % 34.7  39.0  36.9   Platelets 150 - 400 K/uL 272  286  326         Latest Ref Rng & Units 02/01/2023   11:40 AM 01/04/2023    9:26 AM 11/23/2022    9:39 AM  CMP  Glucose 70 - 99 mg/dL 139  86  97   BUN 8 - 23 mg/dL '13  12  20   '$ Creatinine 0.44 - 1.00 mg/dL 0.93  0.98  0.78   Sodium 135 - 145 mmol/L 141  141  139   Potassium 3.5 - 5.1 mmol/L 3.5  3.1  3.7   Chloride 98 - 111 mmol/L 105  107  106   CO2 22 - 32 mmol/L '27  26  25   '$ Calcium 8.9 - 10.3 mg/dL 9.0  9.1  9.3   Total Protein 6.5 - 8.1 g/dL 7.0  6.4  6.7   Total Bilirubin 0.3 - 1.2 mg/dL 0.5  0.4  0.5   Alkaline Phos 38 - 126 U/L 120  116  109   AST 15 - 41 U/L '22  18  16   '$ ALT 0 - 44 U/L '10  8  8       '$ RADIOGRAPHIC STUDIES: I have personally reviewed the radiological images as listed and  agreed with the findings in the report. No results found.    Orders Placed This Encounter  Procedures   CBC with Differential ([facility] Only)    Standing Status:   Future    Standing Expiration Date:   03/28/2024   CMP ([facility] only)    Standing Status:   Future    Standing Expiration Date:   03/28/2024   CBC with Differential ([facility] Only)    Standing Status:   Future    Standing Expiration Date:   04/11/2024   CMP ([facility] only)    Standing Status:   Future    Standing Expiration Date:   04/11/2024   Total Protein, Urine dipstick    Standing Status:   Future    Standing Expiration Date:   04/11/2024   All questions were answered. The patient knows to call the clinic with any problems, questions or concerns. No barriers to learning was detected. The total time spent in the appointment was 25 minutes.     [name], MD 02/15/2023   Felicity Coyer, CMA, am acting as scribe for [name], MD.   I have reviewed the above documentation for accuracy and completeness, and I agree with the above.

## 2023-02-14 NOTE — Assessment & Plan Note (Signed)
-  continue meds -f/u with pulmonary  

## 2023-02-14 NOTE — Assessment & Plan Note (Signed)
pT4aN1b stage III, MMR normal, peritoneal metastasis in 01/2021 -diagnosed 02/2020, s/p emergent surgery for perforation. Path showed invasive adenocarcinoma with perforation, +3/15 LNs, and clear margins. Initial CT scan negative for distant metastasis. She was not a candidate for adjuvant chemo due to very slow recovery from surgery -she had recurrence in peritoneum with invasion to appendiceal orifice.  -She began Xeloda on 02/01/21, subsequently change to CAPOX with dose redution and beva, she had good response and stayed on it for 2 years  --Her PET scan from January 21, 2023 showed overall disease progression comparing to PET from January 2023.  I changed her chemotherapy to FOLFIRI and beva, started on 02/01/2023

## 2023-02-14 NOTE — Assessment & Plan Note (Signed)
-  incidental finding on CT 08/14/21 -no hypoxia, stable respiratory status. She began xarelto 08/15/21 -Beva was held through 12/28/21 

## 2023-02-15 ENCOUNTER — Inpatient Hospital Stay: Payer: Medicare HMO | Attending: Hematology | Admitting: Hematology

## 2023-02-15 ENCOUNTER — Encounter: Payer: Self-pay | Admitting: Hematology

## 2023-02-15 ENCOUNTER — Inpatient Hospital Stay: Payer: Medicare HMO

## 2023-02-15 ENCOUNTER — Other Ambulatory Visit: Payer: Self-pay

## 2023-02-15 VITALS — BP 190/68 | HR 68 | Temp 97.5°F | Resp 20 | Ht 60.0 in | Wt 118.1 lb

## 2023-02-15 VITALS — BP 170/72 | HR 63 | Temp 97.7°F | Resp 15

## 2023-02-15 DIAGNOSIS — J4489 Other specified chronic obstructive pulmonary disease: Secondary | ICD-10-CM | POA: Insufficient documentation

## 2023-02-15 DIAGNOSIS — C19 Malignant neoplasm of rectosigmoid junction: Secondary | ICD-10-CM

## 2023-02-15 DIAGNOSIS — Z86718 Personal history of other venous thrombosis and embolism: Secondary | ICD-10-CM | POA: Diagnosis not present

## 2023-02-15 DIAGNOSIS — Z7901 Long term (current) use of anticoagulants: Secondary | ICD-10-CM | POA: Insufficient documentation

## 2023-02-15 DIAGNOSIS — Z5111 Encounter for antineoplastic chemotherapy: Secondary | ICD-10-CM | POA: Diagnosis present

## 2023-02-15 DIAGNOSIS — Z5112 Encounter for antineoplastic immunotherapy: Secondary | ICD-10-CM | POA: Insufficient documentation

## 2023-02-15 DIAGNOSIS — R062 Wheezing: Secondary | ICD-10-CM | POA: Diagnosis not present

## 2023-02-15 DIAGNOSIS — Z933 Colostomy status: Secondary | ICD-10-CM | POA: Diagnosis not present

## 2023-02-15 DIAGNOSIS — I10 Essential (primary) hypertension: Secondary | ICD-10-CM | POA: Insufficient documentation

## 2023-02-15 DIAGNOSIS — Z86711 Personal history of pulmonary embolism: Secondary | ICD-10-CM | POA: Diagnosis not present

## 2023-02-15 DIAGNOSIS — C20 Malignant neoplasm of rectum: Secondary | ICD-10-CM | POA: Insufficient documentation

## 2023-02-15 DIAGNOSIS — N939 Abnormal uterine and vaginal bleeding, unspecified: Secondary | ICD-10-CM | POA: Diagnosis not present

## 2023-02-15 DIAGNOSIS — C786 Secondary malignant neoplasm of retroperitoneum and peritoneum: Secondary | ICD-10-CM | POA: Insufficient documentation

## 2023-02-15 DIAGNOSIS — Z79899 Other long term (current) drug therapy: Secondary | ICD-10-CM | POA: Insufficient documentation

## 2023-02-15 DIAGNOSIS — C799 Secondary malignant neoplasm of unspecified site: Secondary | ICD-10-CM

## 2023-02-15 DIAGNOSIS — J449 Chronic obstructive pulmonary disease, unspecified: Secondary | ICD-10-CM | POA: Diagnosis not present

## 2023-02-15 LAB — CMP (CANCER CENTER ONLY)
ALT: 10 U/L (ref 0–44)
AST: 21 U/L (ref 15–41)
Albumin: 3.9 g/dL (ref 3.5–5.0)
Alkaline Phosphatase: 108 U/L (ref 38–126)
Anion gap: 8 (ref 5–15)
BUN: 17 mg/dL (ref 8–23)
CO2: 27 mmol/L (ref 22–32)
Calcium: 9 mg/dL (ref 8.9–10.3)
Chloride: 109 mmol/L (ref 98–111)
Creatinine: 1.06 mg/dL — ABNORMAL HIGH (ref 0.44–1.00)
GFR, Estimated: 57 mL/min — ABNORMAL LOW (ref >60)
Glucose, Bld: 97 mg/dL (ref 70–99)
Potassium: 3.2 mmol/L — ABNORMAL LOW (ref 3.5–5.1)
Sodium: 144 mmol/L (ref 135–145)
Total Bilirubin: 0.4 mg/dL (ref 0.3–1.2)
Total Protein: 6.7 g/dL (ref 6.5–8.1)

## 2023-02-15 LAB — CBC WITH DIFFERENTIAL (CANCER CENTER ONLY)
Abs Immature Granulocytes: 0.01 10*3/uL (ref 0.00–0.07)
Basophils Absolute: 0 10*3/uL (ref 0.0–0.1)
Basophils Relative: 1 %
Eosinophils Absolute: 0.2 10*3/uL (ref 0.0–0.5)
Eosinophils Relative: 3 %
HCT: 34.7 % — ABNORMAL LOW (ref 36.0–46.0)
Hemoglobin: 11.7 g/dL — ABNORMAL LOW (ref 12.0–15.0)
Immature Granulocytes: 0 %
Lymphocytes Relative: 30 %
Lymphs Abs: 1.3 10*3/uL (ref 0.7–4.0)
MCH: 33.2 pg (ref 26.0–34.0)
MCHC: 33.7 g/dL (ref 30.0–36.0)
MCV: 98.6 fL (ref 80.0–100.0)
Monocytes Absolute: 0.4 10*3/uL (ref 0.1–1.0)
Monocytes Relative: 10 %
Neutro Abs: 2.5 10*3/uL (ref 1.7–7.7)
Neutrophils Relative %: 56 %
Platelet Count: 272 10*3/uL (ref 150–400)
RBC: 3.52 MIL/uL — ABNORMAL LOW (ref 3.87–5.11)
RDW: 14 % (ref 11.5–15.5)
WBC Count: 4.4 10*3/uL (ref 4.0–10.5)
nRBC: 0 % (ref 0.0–0.2)

## 2023-02-15 MED ORDER — ATROPINE SULFATE 1 MG/ML IV SOLN
0.5000 mg | Freq: Once | INTRAVENOUS | Status: AC | PRN
  Administered 2023-02-15: 0.5 mg via INTRAVENOUS
  Filled 2023-02-15: qty 1

## 2023-02-15 MED ORDER — SODIUM CHLORIDE 0.9 % IV SOLN
10.0000 mg | Freq: Once | INTRAVENOUS | Status: AC
  Administered 2023-02-15: 10 mg via INTRAVENOUS
  Filled 2023-02-15: qty 10

## 2023-02-15 MED ORDER — SODIUM CHLORIDE 0.9 % IV SOLN
2000.0000 mg/m2 | INTRAVENOUS | Status: DC
  Administered 2023-02-15: 3000 mg via INTRAVENOUS
  Filled 2023-02-15: qty 60

## 2023-02-15 MED ORDER — SODIUM CHLORIDE 0.9 % IV SOLN: Freq: Once | INTRAVENOUS | Status: AC

## 2023-02-15 MED ORDER — SODIUM CHLORIDE 0.9% FLUSH
10.0000 mL | Freq: Once | INTRAVENOUS | Status: AC
  Administered 2023-02-15: 10 mL

## 2023-02-15 MED ORDER — SODIUM CHLORIDE 0.9 % IV SOLN
150.0000 mg/m2 | Freq: Once | INTRAVENOUS | Status: AC
  Administered 2023-02-15: 220 mg via INTRAVENOUS
  Filled 2023-02-15: qty 11

## 2023-02-15 MED ORDER — SODIUM CHLORIDE 0.9% FLUSH: 10.0000 mL | INTRAVENOUS | Status: DC | PRN

## 2023-02-15 MED ORDER — PALONOSETRON HCL INJECTION 0.25 MG/5ML
0.2500 mg | Freq: Once | INTRAVENOUS | Status: AC
  Administered 2023-02-15: 0.25 mg via INTRAVENOUS
  Filled 2023-02-15: qty 5

## 2023-02-15 MED ORDER — SODIUM CHLORIDE 0.9 % IV SOLN
5.0000 mg/kg | Freq: Once | INTRAVENOUS | Status: AC
  Administered 2023-02-15: 275 mg via INTRAVENOUS
  Filled 2023-02-15: qty 11

## 2023-02-15 MED ORDER — SODIUM CHLORIDE 0.9 % IV SOLN
400.0000 mg/m2 | Freq: Once | INTRAVENOUS | Status: AC
  Administered 2023-02-15: 600 mg via INTRAVENOUS
  Filled 2023-02-15: qty 30

## 2023-02-15 MED ORDER — CLONIDINE HCL 0.1 MG PO TABS
0.1000 mg | ORAL_TABLET | Freq: Once | ORAL | Status: AC
  Administered 2023-02-15: 0.1 mg via ORAL
  Filled 2023-02-15: qty 1

## 2023-02-15 NOTE — Patient Instructions (Signed)
[facility]  Discharge Instructions: Thank you for choosing [facility] to provide your oncology and hematology care.   If you have a lab appointment with the [facility], please go directly to the [facility] and check in at the registration area.   Wear comfortable clothing and clothing appropriate for easy access to any Portacath or PICC line.   We strive to give you quality time with your provider. You may need to reschedule your appointment if you arrive late (15 or more minutes).  Arriving late affects you and other patients whose appointments are after yours.  Also, if you miss three or more appointments without notifying the office, you may be dismissed from the clinic at the provider's discretion.      For prescription refill requests, have your pharmacy contact our office and allow 72 hours for refills to be completed.    Today you received the following chemotherapy and/or immunotherapy agents: Bevacizumab, Irinotecan, Leucovorin, 5FU      To help prevent nausea and vomiting after your treatment, we encourage you to take your nausea medication as directed.  BELOW ARE SYMPTOMS THAT SHOULD BE REPORTED IMMEDIATELY: *FEVER GREATER THAN 100.4 F (38 C) OR HIGHER *CHILLS OR SWEATING *NAUSEA AND VOMITING THAT IS NOT CONTROLLED WITH YOUR NAUSEA MEDICATION *UNUSUAL SHORTNESS OF BREATH *UNUSUAL BRUISING OR BLEEDING *URINARY PROBLEMS (pain or burning when urinating, or frequent urination) *BOWEL PROBLEMS (unusual diarrhea, constipation, pain near the anus) TENDERNESS IN MOUTH AND THROAT WITH OR WITHOUT PRESENCE OF ULCERS (sore throat, sores in mouth, or a toothache) UNUSUAL RASH, SWELLING OR PAIN  UNUSUAL VAGINAL DISCHARGE OR ITCHING   Items with * indicate a potential emergency and should be followed up as soon as possible or go to the Emergency Department if any problems should occur.  Please show the CHEMOTHERAPY ALERT CARD or  IMMUNOTHERAPY ALERT CARD at check-in to the Emergency Department and triage nurse.  Should you have questions after your visit or need to cancel or reschedule your appointment, please contact [facility]  Dept: [phone]  and follow the prompts.  Office hours are 8:00 a.m. to 4:30 p.m. Monday - Friday. Please note that voicemails left after 4:00 p.m. may not be returned until the following business day.  We are closed weekends and major holidays. You have access to a nurse at all times for urgent questions. Please call the main number to the clinic Dept: [phone] and follow the prompts.   For any non-urgent questions, you may also contact your provider using MyChart. We now offer e-Visits for anyone 57 and older to request care online for non-urgent symptoms. For details visit [email or web address].   Also download the MyChart app! Go to the app store, search "MyChart", open the app, select [facility], and log in with your MyChart username and password.

## 2023-02-17 ENCOUNTER — Inpatient Hospital Stay: Payer: Medicare HMO

## 2023-02-17 VITALS — BP 200/72 | HR 65 | Temp 97.7°F | Resp 16

## 2023-02-17 DIAGNOSIS — J4489 Other specified chronic obstructive pulmonary disease: Secondary | ICD-10-CM | POA: Diagnosis not present

## 2023-02-17 DIAGNOSIS — C786 Secondary malignant neoplasm of retroperitoneum and peritoneum: Secondary | ICD-10-CM | POA: Diagnosis not present

## 2023-02-17 DIAGNOSIS — R062 Wheezing: Secondary | ICD-10-CM | POA: Diagnosis not present

## 2023-02-17 DIAGNOSIS — C19 Malignant neoplasm of rectosigmoid junction: Secondary | ICD-10-CM

## 2023-02-17 DIAGNOSIS — N939 Abnormal uterine and vaginal bleeding, unspecified: Secondary | ICD-10-CM | POA: Diagnosis not present

## 2023-02-17 DIAGNOSIS — Z86718 Personal history of other venous thrombosis and embolism: Secondary | ICD-10-CM | POA: Diagnosis not present

## 2023-02-17 DIAGNOSIS — C799 Secondary malignant neoplasm of unspecified site: Secondary | ICD-10-CM

## 2023-02-17 DIAGNOSIS — C20 Malignant neoplasm of rectum: Secondary | ICD-10-CM | POA: Diagnosis not present

## 2023-02-17 DIAGNOSIS — Z7901 Long term (current) use of anticoagulants: Secondary | ICD-10-CM | POA: Diagnosis not present

## 2023-02-17 DIAGNOSIS — I10 Essential (primary) hypertension: Secondary | ICD-10-CM | POA: Diagnosis not present

## 2023-02-17 DIAGNOSIS — Z86711 Personal history of pulmonary embolism: Secondary | ICD-10-CM | POA: Diagnosis not present

## 2023-02-17 DIAGNOSIS — Z5112 Encounter for antineoplastic immunotherapy: Secondary | ICD-10-CM | POA: Diagnosis not present

## 2023-02-17 DIAGNOSIS — Z79899 Other long term (current) drug therapy: Secondary | ICD-10-CM | POA: Diagnosis not present

## 2023-02-17 DIAGNOSIS — Z5111 Encounter for antineoplastic chemotherapy: Secondary | ICD-10-CM | POA: Diagnosis not present

## 2023-02-17 DIAGNOSIS — Z933 Colostomy status: Secondary | ICD-10-CM | POA: Diagnosis not present

## 2023-02-17 MED ORDER — HEPARIN SOD (PORK) LOCK FLUSH 100 UNIT/ML IV SOLN
500.0000 [IU] | Freq: Once | INTRAVENOUS | Status: AC
  Administered 2023-02-17: 500 [IU]

## 2023-02-17 MED ORDER — SODIUM CHLORIDE 0.9% FLUSH
10.0000 mL | Freq: Once | INTRAVENOUS | Status: AC
  Administered 2023-02-17: 10 mL

## 2023-02-19 ENCOUNTER — Other Ambulatory Visit: Payer: Self-pay | Admitting: Pulmonary Disease

## 2023-02-19 ENCOUNTER — Telehealth: Payer: Self-pay

## 2023-02-19 NOTE — Telephone Encounter (Signed)
Spoke with patient regarding results/recommendations.  

## 2023-02-19 NOTE — Telephone Encounter (Signed)
Laya calling in regards to blood pressure.  She had infusion on 3/6, and blood pressure was high (documented on visit 02/17/23) 200/72.  She took blood pressure again today and was 173/73.  Cancer doctor told patient to contact Dr. Raoul Pitch today about changing meds.  Please call patient at [phone]

## 2023-02-19 NOTE — Telephone Encounter (Signed)
Please have patient increase the dose to 100 mg  (two of the 50 mg tabs) starting today and we will see her at her appt to decide if we need to add additional agent with losartan.

## 2023-02-22 DIAGNOSIS — Z933 Colostomy status: Secondary | ICD-10-CM | POA: Diagnosis not present

## 2023-02-24 ENCOUNTER — Ambulatory Visit (INDEPENDENT_AMBULATORY_CARE_PROVIDER_SITE_OTHER): Payer: Medicare HMO | Admitting: Family Medicine

## 2023-02-24 ENCOUNTER — Telehealth: Payer: Self-pay | Admitting: *Deleted

## 2023-02-24 ENCOUNTER — Telehealth: Payer: Self-pay

## 2023-02-24 ENCOUNTER — Encounter: Payer: Self-pay | Admitting: Family Medicine

## 2023-02-24 ENCOUNTER — Other Ambulatory Visit: Payer: Self-pay | Admitting: Nurse Practitioner

## 2023-02-24 VITALS — BP 159/76 | HR 76 | Temp 97.3°F | Wt 119.0 lb

## 2023-02-24 DIAGNOSIS — C799 Secondary malignant neoplasm of unspecified site: Secondary | ICD-10-CM | POA: Diagnosis not present

## 2023-02-24 DIAGNOSIS — Z7189 Other specified counseling: Secondary | ICD-10-CM | POA: Diagnosis not present

## 2023-02-24 DIAGNOSIS — J449 Chronic obstructive pulmonary disease, unspecified: Secondary | ICD-10-CM | POA: Diagnosis not present

## 2023-02-24 DIAGNOSIS — J0101 Acute recurrent maxillary sinusitis: Secondary | ICD-10-CM | POA: Diagnosis not present

## 2023-02-24 MED ORDER — AMLODIPINE BESYLATE 2.5 MG PO TABS: 2.5000 mg | ORAL_TABLET | Freq: Every day | ORAL | 1 refills | Status: DC

## 2023-02-24 MED ORDER — PREDNISONE 20 MG PO TABS: ORAL_TABLET | ORAL | 0 refills | Status: DC

## 2023-02-24 MED ORDER — DOXYCYCLINE HYCLATE 100 MG PO TABS: 100.0000 mg | ORAL_TABLET | Freq: Two times a day (BID) | ORAL | 0 refills | Status: DC

## 2023-02-24 MED ORDER — LOSARTAN POTASSIUM 100 MG PO TABS: 100.0000 mg | ORAL_TABLET | Freq: Every day | ORAL | 1 refills | Status: DC

## 2023-02-24 NOTE — Telephone Encounter (Signed)
Spoke with pt via telephone regarding her recent call about vaginal bleeding.  Spoke with Cira Rue, NP and in informed pt that [facility] would like for the pt to stop the Xarelto for now to see if the bleeding subsides.  Pt confirmed that the bleeding is coming from her vagina and NOT her rectum.  Informed pt that Regan Rakers is consulting with another team and she or someone from our Team will be contacting the pt regarding further discussion.

## 2023-02-24 NOTE — Patient Instructions (Signed)
Return in about 23 days (around 03/19/2023) for hypertension.        Great to see you today.  I have refilled the medication(s) we provide.   If labs were collected, we will inform you of lab results once received either by echart message or telephone call.   - echart message- for normal results that have been seen by the patient already.   - telephone call: abnormal results or if patient has not viewed results in their echart.

## 2023-02-24 NOTE — Telephone Encounter (Signed)
Spoke with the patient regarding the referral to GYN oncology. Patient scheduled as new patient with Dr Berline Lopes on 3/22 at 10:30 am. Patient given an arrival time of 10 am.  Explained to the patient the the doctor will perform a pelvic exam at this visit. Patient given the policy that only one visitor allowed and that visitor must be over 16 yrs are allowed in the [facility]. Patient given the address/phone number for the clinic and that the center offers free valet service. Patient aware of the mask mandate.

## 2023-02-24 NOTE — Progress Notes (Signed)
Patient ID: Tracy [name], female  DOB: 1951-08-21, 72 y.o.   MRN: [id number] Patient Care Team    Relationship Specialty Notifications Start End  [name], DO PCP - General Family Medicine  05/21/20   [name], [name] Consulting Physician Hematology All results, Admissions 05/07/20   [name], DO Consulting Physician Pulmonary Disease  05/27/20   [facility]    05/27/20   [name], [name] Consulting Physician General Surgery  [id number]   [name], [name] Consulting Physician General Surgery  05/27/20   [name], [name], [name] Consulting Physician Gastroenterology  01/01/21     Chief Complaint  Patient presents with   Hypertension    Forgot to take meds this morning    Subjective: Tracy [name] is a 72 y.o.  Female  present to discuss ongoing care and resources for palliative care/hospice secondary to her terminal cancer diagnoses.   Hypertension: Pt reports compliance with losartan 100 mg daily. Blood pressures ranges at home 171-201/86-1 06.  Patient denies chest pain, shortness of breath or lower extremity edema.   _________ documentation_______________ Colon cancer: Tracy [name]'s colon cancer has metastasized and unfortunately is now considered terminal.  She may or may not undergo more chemotherapy, she has not decided as of yet.  She states they have given her 6-9 months to live.  She would like to move forward with setting up palliative care and discuss end-of-life goals/desires.  She reports overall she feels okay. Still thinking about DNR/DNI. She has concerns over her severe COPD and treatment plan/options that she has to her as her illnesses progress. She reports she is still eating well, even has gained a little weight. She is looking forward to spending as much time as possible with her family, especially her grandchildren and the time that she has left.     02/24/2023   11:05 AM 08/07/2022    1:22 PM 01/01/2021    9:59 AM 10/08/2020     8:26 AM 07/02/2020   11:09 AM  Depression screen PHQ 2/9  Decreased Interest 0 0 0 0 0  Down, Depressed, Hopeless 0 0 0 0 0  PHQ - 2 Score 0 0 0 0 0  Altered sleeping     0  Tired, decreased energy     0  Change in appetite     0  Feeling bad or failure about yourself      0  Trouble concentrating     0  Moving slowly or fidgety/restless     0  Suicidal thoughts     0  PHQ-9 Score     0  Difficult doing work/chores     Not difficult at all       No data to display          Immunization History  Administered Date(s) Administered   Fluad Quad(high Dose 65+) 10/27/2019, 10/08/2020   Influenza Split 01/06/2013, 09/13/2013   Influenza, High Dose Seasonal PF 10/10/2018   Influenza,inj,Quad PF,6+ Mos 09/13/2014, 10/15/2015   Influenza,inj,Quad PF,6-35 Mos 10/26/2016   Moderna Sars-Covid-2 Vaccination 01/09/2020, 02/06/2020, 10/10/2020   Pneumococcal Conjugate-13 07/12/2014, 01/10/2021   Pneumococcal Polysaccharide-23 01/06/2013, 10/10/2018   Tdap 05/15/2013   Zoster, Live 07/14/2013     Past Medical History:  Diagnosis Date   Acute deep vein thrombosis (DVT) of brachial vein of right upper extremity (HCC)    Acute on chronic respiratory failure with hypoxia (HCC)    Asthma    Cataract  CHICKENPOX, HX OF 01/06/2011   Qualifier: Diagnosis of  By: Tracy [name], Tracy [name]     Closed fracture of fifth metatarsal bone 08/10/2022   Colon cancer ([facility])    COPD (chronic obstructive pulmonary disease) ([facility]) 2011   FeV1 31% predicted FeV1/FVX 47 %   COVID-19 05/2021   Essential hypertension 05/23/2020   Hemorrhoid    HIP PAIN, LEFT 01/06/2011   Qualifier: Diagnosis of   By: Tracy [name], Tracy [name]       Open right radial fracture 2020   Osteopenia    Perforated sigmoid colon ([facility]) 03/03/2020   Pleural effusion    Pneumonia 2021   Postoperative intra-abdominal abscess 2021   Rectal cancer (HCC)    Seasonal allergies    takes Claritin daily prn   Shock circulatory ([facility])    Tracheostomy  status ([facility])    Vitamin D deficiency    takes Vit d every 14 days   Allergies  Allergen Reactions   Amlodipine     Leg swelling.    Codeine Swelling   Past Surgical History:  Procedure Laterality Date   AUGMENTATION MAMMAPLASTY     saline   EXAMINATION UNDER ANESTHESIA  10/14/2012   Procedure: EXAM UNDER ANESTHESIA;  Surgeon: Gayland Curry, [name],FACS;  Location: [facility];  Service: General;  Laterality: N/A;  rectal exam under anesthesia excisional hemorrhoidectomy hemorrhoidal banding x two   EXAMINATION UNDER ANESTHESIA  02/03/2013   excision hemorrhoidal tissue   FOOT SURGERY     left bunionectomy   HEMORRHOIDECTOMY WITH HEMORRHOID BANDING  10/14/2012   Procedure: HEMORRHOIDECTOMY WITH HEMORRHOID BANDING;  Surgeon: Gayland Curry, [name],FACS;  Location: [facility];  Service: General;  Laterality: N/A;   IR IMAGING GUIDED PORT INSERTION  02/03/2021   IR THORACENTESIS ASP PLEURAL SPACE W/IMG GUIDE  04/23/2020   LAPAROTOMY N/A 03/03/2020   Procedure: low anterior resection end colostomy;  Surgeon: [name], [name];  Location: [facility];  Service: General;  Laterality: N/A;   LAPAROTOMY N/A 03/12/2020   Procedure: EXPLORATORY LAPAROTOMY WITH BOWEL RESECTION AND COLOSTOMY;  Surgeon: [name], [name];  Location: [facility];  Service: General;  Laterality: N/A;   OPEN REDUCTION INTERNAL FIXATION (ORIF) DISTAL RADIAL FRACTURE Right 11/01/2018   Procedure: OPEN REDUCTION INTERNAL FIXATION (ORIF) DISTAL RADIAL FRACTURE;  Surgeon: [name], [name];  Location: [facility];  Service: Orthopedics;  Laterality: Right;   TONSILLECTOMY  1960   recurrent otitis media   US ECHOCARDIOGRAPHY  02/2020    poor windows, normal LV function, severely dilated RV with moderately reduced function, RV volume and pressure overload, mildly dilated RA   Family History  Problem Relation Age of Onset   Osteoporosis Mother    Other Father        [place] in snow, broke ankle, blood clot to lungs   Heart murmur Sister     Cancer Sister        leukemia    Other Brother        h/o being hit by lightening   Parkinsonism Brother    Asthma Son    Allergies Son    Otitis media Son    Other Sister        CML   Asthma Son    Allergies Son    Pancreatic cancer Brother 8       diag 2013-12-09-deceased 02-08-14   Prostate cancer Brother    Prostate cancer Paternal Uncle    Colon cancer Neg Hx    Rectal cancer  Neg Hx    Stomach cancer Neg Hx    Social History   Social History Narrative   Marital status/children/pets: Married.  2 children.   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   Safety:      -smoke alarm in the home:Yes     - wears seatbelt: Yes     - Feels safe in their relationships: Yes    Allergies as of 02/24/2023       Reactions   Amlodipine    Leg swelling.    Codeine Swelling        Medication List        Accurate as of February 24, 2023 11:27 AM. If you have any questions, ask your nurse or doctor.          acetaminophen 325 MG tablet Commonly known as: Tylenol Take 2 tablets (650 mg total) by mouth every 6 (six) hours as needed for mild pain.   albuterol (2.5 MG/3ML) 0.083% nebulizer solution Commonly known as: PROVENTIL Take 3 mLs (2.5 mg total) by nebulization every 4 (four) hours as needed for wheezing or shortness of breath. DX: J44.9   albuterol 108 (90 Base) MCG/ACT inhaler Commonly known as: VENTOLIN HFA Inhale 2 puffs into the lungs every 6 (six) hours as needed for wheezing or shortness of breath.   amLODipine 2.5 MG tablet Commonly known as: NORVASC Take 1 tablet (2.5 mg total) by mouth daily. Started by: Howard Pouch, DO   doxycycline 100 MG tablet Commonly known as: VIBRA-TABS Take 1 tablet (100 mg total) by mouth 2 (two) times daily. Started by: Howard Pouch, DO   fluticasone 50 MCG/ACT nasal spray Commonly known as: FLONASE SPRAY 2 SPRAYS INTO EACH NOSTRIL EVERY DAY   gabapentin 100 MG capsule Commonly known as: Neurontin Take 1  capsule (100 mg total) by mouth at bedtime. OK to increase by '100mg'$  every week until '300mg'$  for left thigh pain.   HYDROcodone bit-homatropine 5-1.5 MG/5ML syrup Commonly known as: HYCODAN Take 5 mLs by mouth every 6 (six) hours as needed for cough.   loratadine 10 MG tablet Commonly known as: CLARITIN Take 1 tablet (10 mg total) by mouth daily.   losartan 100 MG tablet Commonly known as: COZAAR Take 1 tablet (100 mg total) by mouth daily. What changed:  medication strength how much to take Changed by: Howard Pouch, DO   ondansetron 8 MG tablet Commonly known as: ZOFRAN Take 1 tablet (8 mg total) by mouth every 8 (eight) hours as needed for nausea or vomiting.   predniSONE 20 MG tablet Commonly known as: DELTASONE 60 mg x3d, 40 mg x3d, 20 mg x2d, 10 mg x2d Started by: Howard Pouch, DO   rivaroxaban 20 MG Tabs tablet Commonly known as: Xarelto Take 1 tablet (20 mg total) by mouth daily with supper.   Trelegy Ellipta 100-62.5-25 MCG/ACT Aepb Generic drug: Fluticasone-Umeclidin-Vilant TAKE 1 PUFF BY MOUTH EVERY DAY        All past medical history, surgical history, allergies, family history, immunizations andmedications were updated in the EMR today and reviewed under the history and medication portions of their EMR.       ROS: 14 pt review of systems performed and negative (unless mentioned in an HPI)  Objective: BP (!) 159/76   Pulse 76   Temp (!) 97.3 F (36.3 C)   Wt 119 lb (54 kg)   LMP 12/14/2000   SpO2 95%   BMI 23.24 kg/m  Physical Exam Vitals and nursing note reviewed.  Constitutional:      General: She is not in acute distress.    Appearance: Normal appearance. She is not ill-appearing, toxic-appearing or diaphoretic.     Comments: Pleasant female.  HENT:     Head: Normocephalic and atraumatic.     Right Ear: Tympanic membrane, ear canal and external ear normal. There is no impacted cerumen.     Left Ear: Tympanic membrane, ear canal and external  ear normal. There is no impacted cerumen.     Nose: Congestion present. No rhinorrhea.     Mouth/Throat:     Mouth: Mucous membranes are dry.     Pharynx: No oropharyngeal exudate or posterior oropharyngeal erythema.     Comments: Sinus pressure-right maxillary sinus tenderness to palpation with mild swelling Eyes:     General: No scleral icterus.       Right eye: No discharge.        Left eye: No discharge.     Extraocular Movements: Extraocular movements intact.     Conjunctiva/sclera: Conjunctivae normal.     Pupils: Pupils are equal, round, and reactive to light.  Cardiovascular:     Rate and Rhythm: Normal rate and regular rhythm.     Heart sounds: No murmur heard. Pulmonary:     Effort: Pulmonary effort is normal. No respiratory distress.     Breath sounds: Normal breath sounds. No wheezing, rhonchi or rales.  Musculoskeletal:     Right lower leg: No edema.     Left lower leg: No edema.  Skin:    General: Skin is warm.     Findings: No rash.  Neurological:     Mental Status: She is alert and oriented to person, place, and time. Mental status is at baseline.     Motor: No weakness.     Gait: Gait normal.  Psychiatric:        Attention and Perception: Attention and perception normal.        Mood and Affect: Mood normal. Mood is not anxious or depressed. Affect is not angry or inappropriate.        Speech: Speech normal.        Behavior: Behavior normal. Behavior is cooperative.        Thought Content: Thought content normal. Thought content is not paranoid or delusional.        Cognition and Memory: Cognition and memory normal.        Judgment: Judgment normal.     No results found.  Assessment/plan: JOURDYN BORKENHAGEN is a 72 y.o. 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 We discussed palliative care referral today versus hospice referral.  Placed palliative care referral for now and she may need to transition into hospice soon. She reports she has the terms with the diagnosis, and plans on spending as much time enjoying her family and her grandbabies that she can. - Amb Referral to Palliative Care placed last visit  Hypertension: Has remained above goal.   Continue losartan 100 mg daily. Start amlodipine 2.5 mg daily Follow-up in 3-4 weeks and bring home blood pressure cuff with her so that we can compare.  Patient is aware to make sure her blood pressure medicines are in her system at least 2 hours prior to appointment.  Acute deep  vein thrombosis (DVT) of brachial vein of right upper extremity ([facility]) She is compliant with Xarelto, prescribed by oncology team  COPD, severe (HCC)/sinusitis Rest.  Hydrate. Start doxycycline twice daily and prednisone taper.  Return in about 23 days (around 03/19/2023) for hypertension.   No orders of the defined types were placed in this encounter.  Meds ordered this encounter  Medications   losartan (COZAAR) 100 MG tablet    Sig: Take 1 tablet (100 mg total) by mouth daily.    Dispense:  90 tablet    Refill:  1   doxycycline (VIBRA-TABS) 100 MG tablet    Sig: Take 1 tablet (100 mg total) by mouth 2 (two) times daily.    Dispense:  20 tablet    Refill:  0   predniSONE (DELTASONE) 20 MG tablet    Sig: 60 mg x3d, 40 mg x3d, 20 mg x2d, 10 mg x2d    Dispense:  18 tablet    Refill:  0   amLODipine (NORVASC) 2.5 MG tablet    Sig: Take 1 tablet (2.5 mg total) by mouth daily.    Dispense:  90 tablet    Refill:  1    Not a true allergy.   Referral Orders  No referral(s) requested today      Electronically signed by: Howard Pouch, [place]

## 2023-02-24 NOTE — Telephone Encounter (Signed)
Pt called stating she's having a menstrual cycle.  Pt stated it started Saturday with spotting and now it complete bleeding.  Pt wanted to let Dr. Burr Medico or Cira Rue, NP know of this sudden change.  Pt is currently on Capecitabine and takes Xarelto.  Informed pt that this RN will notify both providers and have one of them to contact her regarding the bleeding.  Pt verbalized understanding.

## 2023-02-26 ENCOUNTER — Telehealth: Payer: Self-pay | Admitting: *Deleted

## 2023-02-26 MED FILL — Dexamethasone Sodium Phosphate Inj 100 MG/10ML: INTRAMUSCULAR | Qty: 1 | Status: AC

## 2023-02-26 NOTE — Telephone Encounter (Signed)
Late entry-----02/25/2023 @ 2:45pm---spoke with the patient and offered to move the appt from 3/22 to 3/15; patient declined.

## 2023-02-28 NOTE — Assessment & Plan Note (Signed)
pT4aN1b stage III, MMR normal, peritoneal metastasis in 01/2021 -diagnosed 02/2020, s/p emergent surgery for perforation. Path showed invasive adenocarcinoma with perforation, +3/15 LNs, and clear margins. Initial CT scan negative for distant metastasis. She was not a candidate for adjuvant chemo due to very slow recovery from surgery -Guardant Reveal ctDNA were negative (06/06/20, 07/12/20, and 09/10/20). -surveillance colonoscopy by Dr. Havery Moros 01/06/21 showed new adenocarcinoma at appendiceal orifice. Biopsy confirmed adenocarcinoma -01/27/21 PET showed tumor in appendiceal orifice is likely peritoneal metastasis growing into the cecum. Her peritoneal implants are mainly in the pelvis, difficult to biopsy -She began Xeloda on 02/01/21. Cycle 2 was changed to 1 week on/1 week off, and beva was added. She tolerates this well -PS improved and low dose oxaliplatin was added with C4, now on Capeox/beva q3 weeks, with continuing Xeloda 1 week on/1 week off. -Beva was held for PE found on CT 08/2021, she is on xarelto. Resumed beva q3 weeks 12/29/21 -she twisted her ankle on 07/22/22, Oxaliplatin and beva were held and restarted on 9/14. She continued Xeloda as prescribed through recovery.  -PET scan in 01/2023 showed cancer progression in liver and peritoneum -chemo changed to FOLFIRI and beva on 02/01/2023

## 2023-02-28 NOTE — Assessment & Plan Note (Addendum)
-  on nocturnal home oxygen  -continue medications and f/u with pulmonary

## 2023-02-28 NOTE — Assessment & Plan Note (Signed)
-  We previously discussed the incurable nature of her cancer, and the overall poor prognosis, especially if she does not have good response to chemotherapy or progress on chemo -The patient understands the goal of care is palliative. -I recommend DNR/DNI, she will think about it

## 2023-02-28 NOTE — Assessment & Plan Note (Signed)
-  incidental finding on CT 08/14/21 -no hypoxia, stable respiratory status. She began xarelto 08/15/21 -Beva was held, CT 12/25/21 was clear, Beva resumed 12/29/21

## 2023-03-01 ENCOUNTER — Other Ambulatory Visit: Payer: Self-pay

## 2023-03-01 ENCOUNTER — Inpatient Hospital Stay: Payer: Medicare HMO

## 2023-03-01 ENCOUNTER — Inpatient Hospital Stay: Payer: Medicare HMO | Admitting: Hematology

## 2023-03-01 ENCOUNTER — Encounter: Payer: Self-pay | Admitting: Hematology

## 2023-03-01 VITALS — BP 181/87 | HR 88 | Temp 97.7°F | Resp 20 | Ht 60.0 in | Wt 120.7 lb

## 2023-03-01 VITALS — BP 157/71

## 2023-03-01 DIAGNOSIS — C19 Malignant neoplasm of rectosigmoid junction: Secondary | ICD-10-CM

## 2023-03-01 DIAGNOSIS — C20 Malignant neoplasm of rectum: Secondary | ICD-10-CM | POA: Diagnosis not present

## 2023-03-01 DIAGNOSIS — Z5112 Encounter for antineoplastic immunotherapy: Secondary | ICD-10-CM | POA: Diagnosis not present

## 2023-03-01 DIAGNOSIS — Z7189 Other specified counseling: Secondary | ICD-10-CM

## 2023-03-01 DIAGNOSIS — Z933 Colostomy status: Secondary | ICD-10-CM | POA: Diagnosis not present

## 2023-03-01 DIAGNOSIS — J449 Chronic obstructive pulmonary disease, unspecified: Secondary | ICD-10-CM

## 2023-03-01 DIAGNOSIS — Z86718 Personal history of other venous thrombosis and embolism: Secondary | ICD-10-CM | POA: Diagnosis not present

## 2023-03-01 DIAGNOSIS — Z79899 Other long term (current) drug therapy: Secondary | ICD-10-CM | POA: Diagnosis not present

## 2023-03-01 DIAGNOSIS — N939 Abnormal uterine and vaginal bleeding, unspecified: Secondary | ICD-10-CM | POA: Diagnosis not present

## 2023-03-01 DIAGNOSIS — Z86711 Personal history of pulmonary embolism: Secondary | ICD-10-CM | POA: Diagnosis not present

## 2023-03-01 DIAGNOSIS — Z5111 Encounter for antineoplastic chemotherapy: Secondary | ICD-10-CM | POA: Diagnosis not present

## 2023-03-01 DIAGNOSIS — C799 Secondary malignant neoplasm of unspecified site: Secondary | ICD-10-CM

## 2023-03-01 DIAGNOSIS — J4489 Other specified chronic obstructive pulmonary disease: Secondary | ICD-10-CM | POA: Diagnosis not present

## 2023-03-01 DIAGNOSIS — I10 Essential (primary) hypertension: Secondary | ICD-10-CM | POA: Diagnosis not present

## 2023-03-01 DIAGNOSIS — R062 Wheezing: Secondary | ICD-10-CM | POA: Diagnosis not present

## 2023-03-01 DIAGNOSIS — Z7901 Long term (current) use of anticoagulants: Secondary | ICD-10-CM | POA: Diagnosis not present

## 2023-03-01 DIAGNOSIS — C786 Secondary malignant neoplasm of retroperitoneum and peritoneum: Secondary | ICD-10-CM | POA: Diagnosis not present

## 2023-03-01 LAB — CBC WITH DIFFERENTIAL (CANCER CENTER ONLY)
Abs Immature Granulocytes: 0.03 10*3/uL (ref 0.00–0.07)
Basophils Absolute: 0 10*3/uL (ref 0.0–0.1)
Basophils Relative: 0 %
Eosinophils Absolute: 0 10*3/uL (ref 0.0–0.5)
Eosinophils Relative: 0 %
HCT: 35.8 % — ABNORMAL LOW (ref 36.0–46.0)
Hemoglobin: 12 g/dL (ref 12.0–15.0)
Immature Granulocytes: 0 %
Lymphocytes Relative: 15 %
Lymphs Abs: 1.1 10*3/uL (ref 0.7–4.0)
MCH: 32.3 pg (ref 26.0–34.0)
MCHC: 33.5 g/dL (ref 30.0–36.0)
MCV: 96.2 fL (ref 80.0–100.0)
Monocytes Absolute: 1 10*3/uL (ref 0.1–1.0)
Monocytes Relative: 13 %
Neutro Abs: 5.4 10*3/uL (ref 1.7–7.7)
Neutrophils Relative %: 72 %
Platelet Count: 319 10*3/uL (ref 150–400)
RBC: 3.72 MIL/uL — ABNORMAL LOW (ref 3.87–5.11)
RDW: 14.3 % (ref 11.5–15.5)
WBC Count: 7.6 10*3/uL (ref 4.0–10.5)
nRBC: 0 % (ref 0.0–0.2)

## 2023-03-01 LAB — CMP (CANCER CENTER ONLY)
ALT: 18 U/L (ref 0–44)
AST: 20 U/L (ref 15–41)
Albumin: 4.1 g/dL (ref 3.5–5.0)
Alkaline Phosphatase: 94 U/L (ref 38–126)
Anion gap: 10 (ref 5–15)
BUN: 22 mg/dL (ref 8–23)
CO2: 28 mmol/L (ref 22–32)
Calcium: 9.3 mg/dL (ref 8.9–10.3)
Chloride: 105 mmol/L (ref 98–111)
Creatinine: 1.02 mg/dL — ABNORMAL HIGH (ref 0.44–1.00)
GFR, Estimated: 60 mL/min — ABNORMAL LOW (ref >60)
Glucose, Bld: 81 mg/dL (ref 70–99)
Potassium: 3 mmol/L — ABNORMAL LOW (ref 3.5–5.1)
Sodium: 143 mmol/L (ref 135–145)
Total Bilirubin: 0.3 mg/dL (ref 0.3–1.2)
Total Protein: 7 g/dL (ref 6.5–8.1)

## 2023-03-01 LAB — CEA (IN HOUSE-CHCC): CEA (CHCC-In House): 33.08 ng/mL — ABNORMAL HIGH (ref 0.00–5.00)

## 2023-03-01 LAB — TOTAL PROTEIN, URINE DIPSTICK: Protein, ur: 100 mg/dL — AB

## 2023-03-01 MED ORDER — HEPARIN SOD (PORK) LOCK FLUSH 100 UNIT/ML IV SOLN: 500.0000 [IU] | Freq: Once | INTRAVENOUS | Status: DC | PRN

## 2023-03-01 MED ORDER — CLONIDINE HCL 0.1 MG PO TABS
0.1000 mg | ORAL_TABLET | Freq: Once | ORAL | Status: AC
  Administered 2023-03-01: 0.1 mg via ORAL
  Filled 2023-03-01: qty 1

## 2023-03-01 MED ORDER — SODIUM CHLORIDE 0.9 % IV SOLN
5.0000 mg/kg | Freq: Once | INTRAVENOUS | Status: AC
  Administered 2023-03-01: 275 mg via INTRAVENOUS
  Filled 2023-03-01: qty 11

## 2023-03-01 MED ORDER — SODIUM CHLORIDE 0.9 % IV SOLN
400.0000 mg/m2 | Freq: Once | INTRAVENOUS | Status: AC
  Administered 2023-03-01: 600 mg via INTRAVENOUS
  Filled 2023-03-01: qty 25

## 2023-03-01 MED ORDER — ATROPINE SULFATE 1 MG/ML IV SOLN
0.5000 mg | Freq: Once | INTRAVENOUS | Status: AC | PRN
  Administered 2023-03-01: 0.5 mg via INTRAVENOUS
  Filled 2023-03-01: qty 1

## 2023-03-01 MED ORDER — SODIUM CHLORIDE 0.9% FLUSH
10.0000 mL | Freq: Once | INTRAVENOUS | Status: AC
  Administered 2023-03-01: 10 mL

## 2023-03-01 MED ORDER — PALONOSETRON HCL INJECTION 0.25 MG/5ML
0.2500 mg | Freq: Once | INTRAVENOUS | Status: AC
  Administered 2023-03-01: 0.25 mg via INTRAVENOUS
  Filled 2023-03-01: qty 5

## 2023-03-01 MED ORDER — SODIUM CHLORIDE 0.9% FLUSH: 10.0000 mL | INTRAVENOUS | Status: DC | PRN

## 2023-03-01 MED ORDER — SODIUM CHLORIDE 0.9 % IV SOLN
10.0000 mg | Freq: Once | INTRAVENOUS | Status: AC
  Administered 2023-03-01: 10 mg via INTRAVENOUS
  Filled 2023-03-01: qty 10

## 2023-03-01 MED ORDER — SODIUM CHLORIDE 0.9 % IV SOLN
150.0000 mg/m2 | Freq: Once | INTRAVENOUS | Status: AC
  Administered 2023-03-01: 220 mg via INTRAVENOUS
  Filled 2023-03-01: qty 11

## 2023-03-01 MED ORDER — SODIUM CHLORIDE 0.9 % IV SOLN: Freq: Once | INTRAVENOUS | Status: AC

## 2023-03-01 MED ORDER — SODIUM CHLORIDE 0.9 % IV SOLN
2000.0000 mg/m2 | INTRAVENOUS | Status: DC
  Administered 2023-03-01: 3000 mg via INTRAVENOUS
  Filled 2023-03-01: qty 60

## 2023-03-01 NOTE — Progress Notes (Signed)
[place]   [phone] [phone]   Clinic Follow up Note   Patient Care Team: [name], DO as PCP - General (Family Medicine) [name], MD as Consulting Physician (Hematology) Icard, Octavio Graves, DO as Consulting Physician (Pulmonary Disease) [place], P.A. [name], MD as Consulting Physician (General Surgery) [name], MD as Consulting Physician (General Surgery) [name], [name], MD as Consulting Physician (Gastroenterology)  Date of Service:  03/01/2023  CHIEF COMPLAINT: f/u of colorectal cancer    CURRENT THERAPY:  FOLFIRI+BEVAACIZUMAB start 02/01/2023      ASSESSMENT:  Tracy [name] is a 72 y.o. 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 Path showed invasive adenocarcinoma with perforation, +3/15 LNs, and clear margins. Initial CT scan negative for distant metastasis. She was not a candidate for adjuvant chemo due to very slow recovery from surgery -Guardant Reveal ctDNA were negative (06/06/20, 07/12/20, and 09/10/20). -surveillance colonoscopy by Dr. Havery Moros 01/06/21 showed new adenocarcinoma at appendiceal orifice. Biopsy confirmed adenocarcinoma -01/27/21 PET showed tumor in appendiceal orifice is likely peritoneal metastasis growing into the cecum. Her peritoneal implants are mainly in the pelvis, difficult to biopsy -She began Xeloda on 02/01/21. Cycle 2 was changed to 1 week on/1 week off, and beva was added. She tolerates this well -PS improved and low dose oxaliplatin was added with C4, now on Capeox/beva q3 weeks, with continuing Xeloda 1 week on/1 week off. -Beva was held for PE found on CT 08/2021, she is on xarelto. Resumed beva q3 weeks 12/29/21 -she twisted her ankle on 07/22/22, Oxaliplatin and beva were held and restarted on 9/14. She continued Xeloda as prescribed through  recovery.  -PET scan in 01/2023 showed cancer progression in liver and peritoneum -chemo changed to FOLFIRI and beva on 02/01/2023     COPD, severe (HCC) -on nocturnal home oxygen  -continue medications and f/u with pulmonary   Goals of care, counseling/discussion -We previously discussed the incurable nature of her cancer, and the overall poor prognosis, especially if she does not have good response to chemotherapy or progress on chemo -The patient understands the goal of care is palliative. -her PCP has referred her to home palliative care, I encourage her to start the service. We discussed home hospice service down the road, she is open to that    Personal history of venous thrombosis and embolism -incidental finding on CT 08/14/21 -no hypoxia, stable respiratory status. She began xarelto 08/15/21 -Beva was held, CT 12/25/21 was clear, Beva resumed 12/29/21  Vaginal bleeding -We have held her Xarelto, she is scheduled to see GYN Dr. Berline Lopes in our cancer center later this week.  Allergy and wheezing -She has seasonal allergy, noticed sinus pressure, wheezing since last week, was seen by her PCP, and on prednisone and allergy medicine now. -No fever, productive cough, or signs of infection.  PLAN: -lab reviewed -repeat scan in May -discuss Tumor Marker -proceed with C3 folfiri+beva at same dose -lab/flush, f/u and flofiri4/01/2023 -repeat scan in May  SUMMARY [place]: Oncology History Overview Note  Cancer Staging Rectal cancer [facility]) Staging form: Colon and Rectum, AJCC 8th Edition - Pathologic stage from 03/03/2020: Stage IIIB (pT4a, pN1b, cM0) - Signed by [name], MD on 01/28/2021 Stage prefix: Initial diagnosis Histologic grading system: 4 grade system Histologic grade (G): G2 Residual tumor (R): R0 - None    Rectosigmoid cancer ([place])  03/03/2020 - 04/05/2020 Hospital Admission   She was admitted to ED on 03/03/20 for abdominal pain and nausea. She had been  having diarrhea for several weeks. During hospital stay she developed acute respiratory failure, AKI, RUE DVT from PICC line. Work up showed bowel perforation, small left liver mass and thickening of jejunum. She underwent emergent surgery on 03/03/20 for resection and colostomy placement. Her path showed invasive cancer, metastatic to 3/15 LNs. She had a NGT in placed but this was removed on POD 3. Post op her stoma became necrotic and septic. She had another bowel surgery on 03/12/20, which was NED with necrotic tissue.    03/03/2020 Imaging   CT AP 03/03/20  IMPRESSION: 1. Free intraperitoneal air consistent perforation. Most likely site of perforation is in the distal splenic flexure/proximal descending colon where there is a collection of air and gas measuring 6 centimeters. No obvious soft tissue mass identified in this region. At this site, there is abrupt transition of dilated, stool-filled colon to completely decompressed proximal descending colon. 2. Thickened, inflamed loops of jejunum are identified within the pelvis and are likely reactive. 3. Small hiatal hernia. 4. Benign-appearing 1.6 centimeter mass the LEFT hepatic lobe. Recommend comparison with prior studies if available. 5.  Emphysema (ICD10-J43.9). 6.  Aortic Atherosclerosis (ICD10-I70.0). 7. Bilateral renal scarring.   03/03/2020 Surgery   low anterior resection end colostomy by Dr Marcello Moores    03/03/2020 Initial Biopsy   FINAL MICROSCOPIC DIAGNOSIS: 03/03/20 A. RECTOSIGMOID COLON, LOW ANTERIOR RESECTION:  - Invasive colonic adenocarcinoma, 3.5 cm.  - Tumor invades the visceral peritoneum.  - Margins of resection are not involved.  - Metastatic carcinoma in (3) of (15) lymph nodes.  - See oncology table.   B. ADDITIONAL SIGMOID COLON, RESECTION:  - Colonic tissue, negative for carcinoma.  ADDENDUM:  Mismatch Repair Protein (IHC)   SUMMARY INTERPRETATION: NORMAL  There is preserved expression of the major MMR  proteins. There is a very  low probability that microsatellite instability (MSI) is present.  However, certain clinically significant MMR protein mutations may result  in preservation of nuclear expression. It is recommended that the  preservation of protein expression be correlated with molecular based  MSI testing.   IHC EXPRESSION RESULTS  TEST           RESULT  MLH1:          Preserved nuclear expression  MSH2:          Preserved nuclear expression  MSH6:          Preserved nuclear expression  PMS2:          Preserved nuclear expression   03/03/2020 Cancer Staging   Staging form: Colon and Rectum, AJCC 8th Edition - Pathologic stage from 03/03/2020: Stage IIIB (pT4a, pN1b, cM0) - Signed by [name], MD on 01/28/2021 Stage prefix: Initial diagnosis Histologic grading system: 4 grade system Histologic grade (G): G2 Residual tumor (R): R0 - None   03/08/2020 Imaging   CT AP 03/08/20 IMPRESSION: 1. Interval midline laparotomy with distal colon resection and diverting left lower quadrant colostomy. 2. Diffuse small bowel dilatation with gas fluid levels most consistent with postoperative ileus. 3. Trace ascites within the abdomen and pelvis. No fluid collection or abscess at this time. Surgical drain within the lower pelvis. 4. Indeterminate 1.4 cm subcapsular liver hypodensity. In light of newly diagnosed rectal cancer, metastatic disease cannot be excluded. PET CT may be useful for further evaluation. 5. Interval development of trace bilateral pleural  effusions and diffuse body wall edema.   03/12/2020 Surgery   EXPLORATORY LAPAROTOMY WITH BOWEL RESECTION AND COLOSTOMY by Dr Hassell Done 03/12/20  FINAL MICROSCOPIC DIAGNOSIS: 03/12/20  A. COLON, SPLENIC FLEXURE, RESECTION:  - Segment of colon (37 cm) with perforation and associated inflammation  - Multiple mucosal ulcers with necrotizing inflammation  - No evidence of malignancy    03/12/2020 Imaging   CT AP WO contrast 03/12/20   IMPRESSION: 1. Exam is limited by lack of intravenous contrast, motion, and artifact from the patient's arms adjacent to the torso. Since 03/08/2020, there has been development of relatively large pockets of intraperitoneal free air. While intraperitoneal gas would not be unexpected on postoperative day 9, this gas is new since an intervening study of 03/08/2020 and given the relatively large volume certainly raises concern for bowel perforation. No source for the intraperitoneal free air is evident on this study. There is a surgical drain in the pelvis, but there is no free gas around the drain itself to suggest that it represents the source. 2. New circumferential wall thickening in the splenic flexure, descending colon and sigmoid colon leading into the end colostomy. Infection/inflammation would be a consideration. Ischemia cannot be excluded. 3. Relatively small volume intraperitoneal free fluid. High attenuation small fluid collections in the left upper abdomen may reflect hemorrhage, infection or residua from prior perforation. 4. Interval progression of diffuse body wall edema. 5. Residual contrast material in the renal parenchyma from prior imaging, compatible with renal dysfunction.    03/19/2020 Imaging   CT CAP 03/19/20  IMPRESSION: 1. 5.5 x 3.2 cm fluid collection is noted along the greater curvature of the proximal stomach. 2. Surgical drain is again noted in the pelvis with tip in left lower quadrant. 3. Interval development of crescent-shaped fluid collection measuring 16.4 x 3.3 cm in the epigastric region and left upper quadrant of the abdomen which may extend into the left lower quadrant. Potentially this may represent abscess or developing abscess. Multiple other smaller fluid collections are noted which may represent small abscesses. 4. 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 5. Mild amount of free fluid is noted in the posterior pelvis. 6. Moderate  anasarca is noted. Aortic Atherosclerosis (ICD10-I70.0).   04/12/2020 Imaging   CT AP 04/12/20  IMPRESSION: 1. Fluid collections within the LEFT abdomen have significantly decreased compared to previous CT exams, now nearly completely resolved. 2. Percutaneous drainage catheter with tip coiled posterior to the LEFT kidney, stable positioning compared to the previous study. The more anterior catheter has been removed. 3. Small amount of free fluid persists within the abdomen and pelvis. 4. Trace bibasilar pleural effusions. 5. Anasarca. 6. While reviewing today's study, comparing with a chest/abdomen/pelvis CT from earlier same month, there is question of thrombus in the LEFT internal jugular vein. Recommend ultrasound of the LEFT IJ to exclude DVT. This recommendation discussed with patient's hospitalist, Dr. Owens Shark, on 04/12/2020 at 4:20 p.m.   Emphysema (ICD10-J43.9).   04/22/2020 Imaging   CT AP 04/22/20  IMPRESSION: 1. No recurrent intra-abdominal abscess status post interval removal of left retroperitoneal percutaneous drain. Stable small amount of free pelvic fluid. 2. Enlarging dependent bilateral pleural effusions, now moderate in volume. Associated atelectasis at both lung bases. 3. Progressive anasarca with generalized edema throughout the subcutaneous fat. 4. Stable small subcapsular fluid collection along the anterior aspect of the left hepatic lobe. 5. Aortic Atherosclerosis (ICD10-I70.0).   05/23/2020 Initial Diagnosis   Rectal cancer ([place])   01/14/2021 Imaging  CT CAP IMPRESSION: -5.0 cm soft tissue mass along the posterior aspect of the cecum at the base of the appendix, favored to reflect a peritoneal/serosal implant, corresponding to the patient's known adenocarcinoma. This is new from the prior. -Suspected additional pelvic implants along the uterus and right adnexa, poorly visualized, new/progressive from the prior. -Additional pericapsular lesion along  the lateral spleen is mildly progressive, suspicious for additional peritoneal implant. -No evidence of metastatic disease in the chest.   01/27/2021 PET scan   IMPRESSION:  1. Extensive hypermetabolic peritoneal metastasis, as detailed  above.  2. No evidence of hypermetabolic supradiaphragmatic disease.  3. Diffuse low-level thyroid hypermetabolism can be seen in the  setting of thyroiditis. Consider correlation with thyroid function  labs.  4. Aortic atherosclerosis (ICD10-I70.0) and emphysema (ICD10-J43.9).    02/01/2021 -  Chemotherapy    First-line chemo Xeloda 1500 mg twice daily for day 1-14, every 21 days, started on 02/01/2021. Reduced to 1 week on/ 1 week off and added bevacizumab q2weeks from C2 on 02/24/21.  ----Given good tolerance, I changed to CAPOX and bevacizumab q2weeks from C4 on 03/24/21 with Xeloda 1500mg  in the AM and 1000mg  in the PM 1 week on/1 week off.     03/24/2021 - 01/04/2023 Chemotherapy   Patient is on Treatment Plan : COLORECTAL CapeOx + Bevacizumab q21d     05/08/2021 Imaging   PET  IMPRESSION: 1. There is been interval development of several FDG avid pulmonary nodules within both lungs. Additionally, there is new FDG avid low left paratracheal lymph node. Thoracic metastasis cannot be excluded. 2. Interval improvement in multifocal FDG avid peritoneal metastasis as detailed above.     08/14/2021 Imaging   CT CAP  IMPRESSION: Resolution of previously seen small bilateral pulmonary nodules since previous study.   Stable 8 mm mediastinal lymph node in the AP window.   No evidence of metastatic disease within the abdomen or pelvis.   Nonocclusive pulmonary embolism in bilateral central lower lobe pulmonary arteries, which appears subacute in age.   12/25/2021 Imaging   EXAM: CT CHEST, ABDOMEN, AND PELVIS WITH CONTRAST  IMPRESSION: 1. Minimal, bandlike residua of previously seen FDG avid pulmonary nodules, consistent with treated metastases. No  new pulmonary nodules. 2. Unchanged post treatment appearance of the cecal base, with soft tissue thickening about the cecal base and adjacent pelvis, previously FDG avid and in keeping with known appendiceal orifice malignancy. 3. Unchanged central fluid attenuation lesions about the spleen, left iliopsoas, and left pelvic sidewall, previously FDG avid, consistent with treated peritoneal implants. 4. No evidence of new metastatic disease in the chest, abdomen, or pelvis 5. Status post rectosigmoid colon resection with left lower quadrant end colostomy.   Aortic Atherosclerosis (ICD10-I70.0).   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 - Polypoid lesion obliterating appendiceal orifice. Biopsied to evaluate for malignancy. - The examined portion of the ileum was normal. - Two large polyps in the cecum, not removed today pending path at appendiceal orifice, bowel prep. If removed endoscopically in the future would favor EMR to be done at the hospital. - One 5 mm polyp at the ileocecal valve, removed with a cold snare. Resected and retrieved. - One 3 mm polyp in the transverse colon, removed  with a cold snare. Resected and retrieved. - Parastomal hernia making initial entry to the distal colon challenging. - The examination was otherwise normal.   01/06/2021 Initial Biopsy   Diagnosis 1. Colon, biopsy, appendiceal oraface - ADENOCARCINOMA. 2. Colon, polyp(s), ileocecal valve, transverse, x2 - TUBULAR ADENOMA, NEGATIVE FOR HIGH GRADE DYSPLASIA (X1). - COLONIC MUCOSA WITH UNDERLYING LYMPHOID AGGREGATE, NEGATIVE FOR DYSPLASIA (X1).  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 This is new from the prior. -Suspected additional pelvic implants along the uterus and right adnexa, poorly visualized, new/progressive from the prior. -Additional pericapsular lesion along the lateral spleen is mildly progressive, suspicious for additional peritoneal implant. -No evidence of metastatic disease in the chest.   08/14/2021 Imaging   CT CAP IMPRESSION: Resolution of previously seen small bilateral pulmonary nodules since previous study. Stable 8 mm mediastinal lymph node in the AP window. No evidence of metastatic disease within the abdomen or pelvis. Nonocclusive pulmonary embolism in bilateral central lower lobe pulmonary arteries, which appears subacute in age.  Aortic Atherosclerosis (ICD10-I70.0) and Emphysema (ICD10-J43.9).     08/14/2021 Imaging   CT CAP  IMPRESSION: Resolution of previously seen small bilateral pulmonary nodules since previous study.   Stable 8 mm mediastinal lymph node in the AP window.   No evidence of metastatic disease within the abdomen or pelvis.   Nonocclusive pulmonary embolism in bilateral central lower lobe pulmonary arteries, which appears subacute in age.   12/25/2021 Imaging   EXAM: CT CHEST, ABDOMEN, AND PELVIS WITH CONTRAST  IMPRESSION: 1. Minimal, bandlike residua of previously seen FDG avid pulmonary nodules, consistent with treated metastases. No new pulmonary nodules. 2. Unchanged post treatment appearance of the cecal base, with soft tissue thickening about the cecal base and adjacent pelvis, previously FDG avid and in  keeping with known appendiceal orifice malignancy. 3. Unchanged central fluid attenuation lesions about the spleen, left iliopsoas, and left pelvic sidewall, previously FDG avid, consistent with treated peritoneal implants. 4. No evidence of new metastatic disease in the chest, abdomen, or pelvis 5. Status post rectosigmoid colon resection with left lower quadrant end colostomy.   Aortic Atherosclerosis (ICD10-I70.0).      INTERVAL HISTORY:  Tracy [name] is here for a follow up of colorectal cancer   She was last seen by me on 02/15/2023 She presents to the clinic alone.Pt state she has SOB but think its pertaining to her allergies.Pt denies having headache and fever.Pt state the antibiotic and steroids that she is on she feels like it is helping. Pt state her cough has subsided and has not had to use her cough syrup.    All other systems were reviewed with the patient and are negative.  MEDICAL HISTORY:  Past Medical History:  Diagnosis Date   Acute deep vein thrombosis (DVT) of brachial vein of right upper extremity (HCC)    Acute on chronic respiratory failure with hypoxia ([place])    Asthma    Cataract    CHICKENPOX, HX OF 01/06/2011   Qualifier: Diagnosis of  By: Charlett Blake  MD, Stacey     Closed fracture of fifth metatarsal bone 08/10/2022   Colon cancer ([place])    COPD (chronic obstructive pulmonary disease) ([place]) 2011   FeV1 31% predicted FeV1/FVX 47 %   COVID-19 05/2021   Essential hypertension 05/23/2020   Hemorrhoid    HIP PAIN, LEFT 01/06/2011   Qualifier: Diagnosis of   By: Charlett Blake MD, Stacey       Open right radial fracture 2020   Osteopenia    Perforated sigmoid colon ([place]) 03/03/2020   Pleural effusion    Pneumonia 2021   Postoperative intra-abdominal abscess 2021   Rectal cancer (HCC)    Seasonal allergies    takes Claritin daily prn   Shock circulatory ([place])    Tracheostomy status ([place])    Vitamin D deficiency    takes Vit d every 14 days    SURGICAL  HISTORY: Past Surgical History:  Procedure Laterality Date   AUGMENTATION MAMMAPLASTY     saline   EXAMINATION UNDER ANESTHESIA  10/14/2012   Procedure: EXAM UNDER ANESTHESIA;  Surgeon: Gayland Curry, MD,FACS;  Location: [facility];  Service: General;  Laterality: N/A;  rectal exam under anesthesia excisional hemorrhoidectomy hemorrhoidal banding x two   EXAMINATION UNDER ANESTHESIA  02/03/2013   excision hemorrhoidal tissue   FOOT SURGERY     left bunionectomy   HEMORRHOIDECTOMY WITH HEMORRHOID BANDING  10/14/2012   Procedure: HEMORRHOIDECTOMY WITH HEMORRHOID BANDING;  Surgeon: Gayland Curry, MD,FACS;  Location: [facility];  Service: General;  Laterality: N/A;   IR IMAGING GUIDED PORT INSERTION  02/03/2021   IR THORACENTESIS ASP PLEURAL SPACE W/IMG GUIDE  04/23/2020   LAPAROTOMY N/A 03/03/2020   Procedure: low anterior resection end colostomy;  Surgeon: [name], MD;  Location: [facility];  Service: General;  Laterality: N/A;   LAPAROTOMY N/A 03/12/2020   Procedure: EXPLORATORY LAPAROTOMY WITH BOWEL RESECTION AND COLOSTOMY;  Surgeon: [name], MD;  Location: [facility];  Service: General;  Laterality: N/A;   OPEN REDUCTION INTERNAL FIXATION (ORIF) DISTAL RADIAL FRACTURE Right 11/01/2018   Procedure: OPEN REDUCTION INTERNAL FIXATION (ORIF) DISTAL RADIAL FRACTURE;  Surgeon: [name], MD;  Location: [facility];  Service: Orthopedics;  Laterality: Right;   TONSILLECTOMY  1960   recurrent otitis media   US ECHOCARDIOGRAPHY  02/2020    poor windows, normal LV function, severely dilated RV with moderately reduced function, RV volume and pressure overload, mildly dilated RA    I have reviewed the social history and family history with the patient and they are unchanged from previous note.  ALLERGIES:  is allergic to amlodipine and codeine.  MEDICATIONS:  Current Outpatient Medications  Medication Sig Dispense Refill   acetaminophen (TYLENOL) 325 MG tablet Take 2 tablets (650 mg  total) by mouth every 6 (six) hours as needed for mild pain. 30 tablet 0   albuterol (PROVENTIL) (2.5 MG/3ML) 0.083% nebulizer solution Take 3 mLs (2.5 mg total) by nebulization every 4 (four) hours as needed for wheezing or shortness of breath. DX: J44.9 360 mL 5   albuterol (VENTOLIN HFA) 108 (90 Base) MCG/ACT inhaler Inhale 2 puffs into the lungs every 6 (six) hours as needed for wheezing or shortness of breath. 8 g 5   amLODipine (NORVASC) 2.5 MG tablet Take 1 tablet (2.5 mg total) by mouth daily. 90 tablet 1   doxycycline (VIBRA-TABS) 100 MG tablet Take 1 tablet (100 mg total) by mouth 2 (two) times daily. 20 tablet 0   fluticasone (FLONASE) 50 MCG/ACT  nasal spray SPRAY 2 SPRAYS INTO EACH NOSTRIL EVERY DAY 48 mL 0   gabapentin (NEURONTIN) 100 MG capsule Take 1 capsule (100 mg total) by mouth at bedtime. OK to increase by 100mg  every week until 300mg  for left thigh pain. 60 capsule 0   HYDROcodone bit-homatropine (HYCODAN) 5-1.5 MG/5ML syrup Take 5 mLs by mouth every 6 (six) hours as needed for cough. 240 mL 0   loratadine (CLARITIN) 10 MG tablet Take 1 tablet (10 mg total) by mouth daily. 90 tablet 3   losartan (COZAAR) 100 MG tablet Take 1 tablet (100 mg total) by mouth daily. 90 tablet 1   ondansetron (ZOFRAN) 8 MG tablet Take 1 tablet (8 mg total) by mouth every 8 (eight) hours as needed for nausea or vomiting. 20 tablet 2   predniSONE (DELTASONE) 20 MG tablet 60 mg x3d, 40 mg x3d, 20 mg x2d, 10 mg x2d 18 tablet 0   rivaroxaban (XARELTO) 20 MG TABS tablet Take 1 tablet (20 mg total) by mouth daily with supper. 90 tablet 1   TRELEGY ELLIPTA 100-62.5-25 MCG/ACT AEPB TAKE 1 PUFF BY MOUTH EVERY DAY 90 each 2   No current facility-administered medications for this visit.    PHYSICAL EXAMINATION: ECOG PERFORMANCE STATUS: 3 - Symptomatic, >50% confined to bed  Vitals:   03/01/23 1013 03/01/23 1025  BP:  (!) 181/87  Pulse: 88   Resp: 20   Temp: 97.7 F (36.5 C)   SpO2: 98%    Wt  Readings from Last 3 Encounters:  03/01/23 120 lb 11.2 oz (54.7 kg)  02/24/23 119 lb (54 kg)  02/15/23 118 lb 1.6 oz (53.6 kg)     LUNGS:(+) clear to auscultation and percussion with normal breathing effort low on both side. HEART: (-)regular rate & rhythm and no murmurs and no lower extremity edema   LABORATORY DATA:  I have reviewed the data as listed    Latest Ref Rng & Units 03/01/2023    9:41 AM 02/15/2023    7:44 AM 02/01/2023   11:40 AM  CBC  WBC 4.0 - 10.5 K/uL 7.6  4.4  6.4   Hemoglobin 12.0 - 15.0 g/dL 12.0  11.7  12.9   Hematocrit 36.0 - 46.0 % 35.8  34.7  39.0   Platelets 150 - 400 K/uL 319  272  286         Latest Ref Rng & Units 03/01/2023    9:41 AM 02/15/2023    7:44 AM 02/01/2023   11:40 AM  CMP  Glucose 70 - 99 mg/dL 81  97  139   BUN 8 - 23 mg/dL 22  17  13    Creatinine 0.44 - 1.00 mg/dL 1.02  1.06  0.93   Sodium 135 - 145 mmol/L 143  144  141   Potassium 3.5 - 5.1 mmol/L 3.0  3.2  3.5   Chloride 98 - 111 mmol/L 105  109  105   CO2 22 - 32 mmol/L 28  27  27    Calcium 8.9 - 10.3 mg/dL 9.3  9.0  9.0   Total Protein 6.5 - 8.1 g/dL 7.0  6.7  7.0   Total Bilirubin 0.3 - 1.2 mg/dL 0.3  0.4  0.5   Alkaline Phos 38 - 126 U/L 94  108  120   AST 15 - 41 U/L 20  21  22    ALT 0 - 44 U/L 18  10  10        RADIOGRAPHIC STUDIES: I  have personally reviewed the radiological images as listed and agreed with the findings in the report. No results found.    No orders of the defined types were placed in this encounter.  All questions were answered. The patient knows to call the clinic with any problems, questions or concerns. No barriers to learning was detected. The total time spent in the appointment was 35 minutes.     [name], MD 03/01/2023   Tracy [name], CMA, am acting as scribe for [name], MD.   I have reviewed the above documentation for accuracy and completeness, and I agree with the above.

## 2023-03-01 NOTE — Progress Notes (Signed)
Per Burr Medico MD, ok to treat with urine protein of 100 and elevated BP. Per MD, RN to recheck BP prior to Bevacizumab.   Upon rechecking, BP 201/83. MD notified. MD prescribed 0.1mg  Clonidine. Per MD, RN to recheck BP in 30 minutes and hold Bevacizumab if systolic BP is [id number].

## 2023-03-01 NOTE — Patient Instructions (Signed)
[facility]  Discharge Instructions: Thank you for choosing [facility] to provide your oncology and hematology care.   If you have a lab appointment with the Cancer Center, please go directly to the Cancer Center and check in at the registration area.   Wear comfortable clothing and clothing appropriate for easy access to any Portacath or PICC line.   We strive to give you quality time with your provider. You may need to reschedule your appointment if you arrive late (15 or more minutes).  Arriving late affects you and other patients whose appointments are after yours.  Also, if you miss three or more appointments without notifying the office, you may be dismissed from the clinic at the provider's discretion.      For prescription refill requests, have your pharmacy contact our office and allow 72 hours for refills to be completed.    Today you received the following chemotherapy and/or immunotherapy agents: Mvasi, Irinotecan, Leucovorin, Fluorouracil.       To help prevent nausea and vomiting after your treatment, we encourage you to take your nausea medication as directed.  BELOW ARE SYMPTOMS THAT SHOULD BE REPORTED IMMEDIATELY: *FEVER GREATER THAN 100.4 F (38 C) OR HIGHER *CHILLS OR SWEATING *NAUSEA AND VOMITING THAT IS NOT CONTROLLED WITH YOUR NAUSEA MEDICATION *UNUSUAL SHORTNESS OF BREATH *UNUSUAL BRUISING OR BLEEDING *URINARY PROBLEMS (pain or burning when urinating, or frequent urination) *BOWEL PROBLEMS (unusual diarrhea, constipation, pain near the anus) TENDERNESS IN MOUTH AND THROAT WITH OR WITHOUT PRESENCE OF ULCERS (sore throat, sores in mouth, or a toothache) UNUSUAL RASH, SWELLING OR PAIN  UNUSUAL VAGINAL DISCHARGE OR ITCHING   Items with * indicate a potential emergency and should be followed up as soon as possible or go to the Emergency Department if any problems should occur.  Please show the CHEMOTHERAPY ALERT CARD  or IMMUNOTHERAPY ALERT CARD at check-in to the Emergency Department and triage nurse.  Should you have questions after your visit or need to cancel or reschedule your appointment, please contact [facility]  Dept: [phone]  and follow the prompts.  Office hours are 8:00 a.m. to 4:30 p.m. Monday - Friday. Please note that voicemails left after 4:00 p.m. may not be returned until the following business day.  We are closed weekends and major holidays. You have access to a nurse at all times for urgent questions. Please call the main number to the clinic Dept: [phone] and follow the prompts.   For any non-urgent questions, you may also contact your provider using MyChart. We now offer e-Visits for anyone 18 and older to request care online for non-urgent symptoms. For details visit [email or web address].   Also download the MyChart app! Go to the app store, search "MyChart", open the app, select [facility], and log in with your MyChart username and password.   

## 2023-03-02 DIAGNOSIS — C19 Malignant neoplasm of rectosigmoid junction: Secondary | ICD-10-CM | POA: Diagnosis not present

## 2023-03-03 ENCOUNTER — Telehealth: Payer: Self-pay

## 2023-03-03 ENCOUNTER — Inpatient Hospital Stay: Payer: Medicare HMO

## 2023-03-03 ENCOUNTER — Other Ambulatory Visit: Payer: Self-pay | Admitting: Hematology

## 2023-03-03 ENCOUNTER — Other Ambulatory Visit: Payer: Self-pay

## 2023-03-03 VITALS — BP 155/90 | HR 92 | Temp 97.7°F | Resp 18

## 2023-03-03 DIAGNOSIS — J4489 Other specified chronic obstructive pulmonary disease: Secondary | ICD-10-CM | POA: Diagnosis not present

## 2023-03-03 DIAGNOSIS — C799 Secondary malignant neoplasm of unspecified site: Secondary | ICD-10-CM

## 2023-03-03 DIAGNOSIS — Z7901 Long term (current) use of anticoagulants: Secondary | ICD-10-CM | POA: Diagnosis not present

## 2023-03-03 DIAGNOSIS — Z86711 Personal history of pulmonary embolism: Secondary | ICD-10-CM | POA: Diagnosis not present

## 2023-03-03 DIAGNOSIS — Z5112 Encounter for antineoplastic immunotherapy: Secondary | ICD-10-CM | POA: Diagnosis not present

## 2023-03-03 DIAGNOSIS — C19 Malignant neoplasm of rectosigmoid junction: Secondary | ICD-10-CM

## 2023-03-03 DIAGNOSIS — Z5111 Encounter for antineoplastic chemotherapy: Secondary | ICD-10-CM | POA: Diagnosis not present

## 2023-03-03 DIAGNOSIS — Z86718 Personal history of other venous thrombosis and embolism: Secondary | ICD-10-CM | POA: Diagnosis not present

## 2023-03-03 DIAGNOSIS — Z79899 Other long term (current) drug therapy: Secondary | ICD-10-CM | POA: Diagnosis not present

## 2023-03-03 DIAGNOSIS — C786 Secondary malignant neoplasm of retroperitoneum and peritoneum: Secondary | ICD-10-CM | POA: Diagnosis not present

## 2023-03-03 DIAGNOSIS — N939 Abnormal uterine and vaginal bleeding, unspecified: Secondary | ICD-10-CM | POA: Diagnosis not present

## 2023-03-03 DIAGNOSIS — Z933 Colostomy status: Secondary | ICD-10-CM | POA: Diagnosis not present

## 2023-03-03 DIAGNOSIS — C20 Malignant neoplasm of rectum: Secondary | ICD-10-CM | POA: Diagnosis not present

## 2023-03-03 DIAGNOSIS — R062 Wheezing: Secondary | ICD-10-CM | POA: Diagnosis not present

## 2023-03-03 DIAGNOSIS — I10 Essential (primary) hypertension: Secondary | ICD-10-CM | POA: Diagnosis not present

## 2023-03-03 MED ORDER — SODIUM CHLORIDE 0.9% FLUSH
10.0000 mL | Freq: Once | INTRAVENOUS | Status: AC
  Administered 2023-03-03: 10 mL

## 2023-03-03 MED ORDER — HEPARIN SOD (PORK) LOCK FLUSH 100 UNIT/ML IV SOLN
500.0000 [IU] | Freq: Once | INTRAVENOUS | Status: AC
  Administered 2023-03-03: 500 [IU]

## 2023-03-03 NOTE — Telephone Encounter (Signed)
Tracy [name] called office today stating she would like to cancel her new patient appointment with Dr. Berline Lopes on Friday 3/22. She states she has a lot going on and has to think everything over. She will call if she decides to schedule another appointment.

## 2023-03-03 NOTE — Telephone Encounter (Signed)
If she is still bleeding, please recommend that she come in for an exam. Thank you

## 2023-03-03 NOTE — Progress Notes (Unsigned)
Appt canceled by patient.

## 2023-03-03 NOTE — Telephone Encounter (Signed)
Per Dr. Berline Lopes I called pt to check on bleeding. If still bleeding Dr. Berline Lopes recommends coming in for an exam.   Voicemail left for pt to call office.

## 2023-03-04 NOTE — Telephone Encounter (Addendum)
Left another voicemail for patient to call office.

## 2023-03-05 ENCOUNTER — Encounter: Payer: Self-pay | Admitting: Hematology

## 2023-03-05 ENCOUNTER — Inpatient Hospital Stay: Payer: Medicare HMO | Admitting: Gynecologic Oncology

## 2023-03-05 ENCOUNTER — Other Ambulatory Visit: Payer: Self-pay

## 2023-03-05 MED ORDER — PROCHLORPERAZINE MALEATE 10 MG PO TABS: 10.0000 mg | ORAL_TABLET | Freq: Four times a day (QID) | ORAL | 1 refills | Status: DC | PRN

## 2023-03-05 MED ORDER — ONDANSETRON HCL 8 MG PO TABS: 8.0000 mg | ORAL_TABLET | Freq: Three times a day (TID) | ORAL | 2 refills | Status: DC | PRN

## 2023-03-05 NOTE — Telephone Encounter (Signed)
Pt returned call stating she has not had any bleeding since her appointment was scheduled with Dr.Tucker on 3/13, she states she also stopped her Xarelto at that time as well. She isn't sure if that is why she hasn't had anymore spotting/bleeding.   I offered to schedule a future appointment and she said she thinks it is not necessary since she only has 6 months to live. She says "I don't think I need to put my body through anything else". Pt was thankful for the call and for Dr.Tucker being concerned about her.

## 2023-03-05 NOTE — Telephone Encounter (Signed)
MyChart message sent to pt to call office regarding cancelled appointment. Per Dr.Tucker if she is bleeding, a pelvic exam is recommended.

## 2023-03-09 ENCOUNTER — Telehealth: Payer: Self-pay | Admitting: Family Medicine

## 2023-03-09 NOTE — Telephone Encounter (Signed)
Pt calls and ask for refill on antibiotics. I advised her Dr. Raoul Pitch will not do that and she will need to be seen again to re-evaluate her symptoms. She continues to say this is ridiculous and this is getting ridiculous. She proceeds to say talk to to her talk to her referring to Dr. Raoul Pitch. Patient refused appointment. I see her appointment was for elevated BP not sickness.

## 2023-03-09 NOTE — Telephone Encounter (Signed)
Pt is on hospice.  We discussed with her if she needs an abx we can see her for acute concerns. She was upset with her PULM team for not being able to see her for illness. I reassured her we would be able to see her for acute concerns, but she would need to be seen to be treated- even if it is virtual.   If she is not agreeable to set up virtual, then I encourage her to call her pulm or onc team.

## 2023-03-09 NOTE — Telephone Encounter (Signed)
Pt scheduled  

## 2023-03-10 ENCOUNTER — Ambulatory Visit (INDEPENDENT_AMBULATORY_CARE_PROVIDER_SITE_OTHER): Payer: Medicare HMO | Admitting: Family Medicine

## 2023-03-10 ENCOUNTER — Other Ambulatory Visit: Payer: Self-pay | Admitting: Internal Medicine

## 2023-03-10 ENCOUNTER — Encounter: Payer: Self-pay | Admitting: Family Medicine

## 2023-03-10 VITALS — BP 156/80 | HR 80 | Temp 97.7°F | Wt 117.0 lb

## 2023-03-10 DIAGNOSIS — J180 Bronchopneumonia, unspecified organism: Secondary | ICD-10-CM | POA: Diagnosis not present

## 2023-03-10 DIAGNOSIS — Z515 Encounter for palliative care: Secondary | ICD-10-CM

## 2023-03-10 DIAGNOSIS — J449 Chronic obstructive pulmonary disease, unspecified: Secondary | ICD-10-CM

## 2023-03-10 MED ORDER — PREDNISONE 20 MG PO TABS: ORAL_TABLET | ORAL | 0 refills | Status: DC

## 2023-03-10 MED ORDER — IPRATROPIUM-ALBUTEROL 0.5-2.5 (3) MG/3ML IN SOLN
3.0000 mL | RESPIRATORY_TRACT | Status: AC
  Administered 2023-03-10: 3 mL via RESPIRATORY_TRACT

## 2023-03-10 MED ORDER — HYDROCODONE BIT-HOMATROP MBR 5-1.5 MG/5ML PO SOLN: 5.0000 mL | Freq: Four times a day (QID) | ORAL | 0 refills | Status: DC | PRN

## 2023-03-10 MED ORDER — LEVOFLOXACIN 750 MG PO TABS: 750.0000 mg | ORAL_TABLET | Freq: Every day | ORAL | 0 refills | Status: AC

## 2023-03-10 NOTE — Patient Instructions (Addendum)
No follow-ups on file.        Great to see you today.  I have refilled the medication(s) we provide.   If labs were collected, we will inform you of lab results once received either by echart message or telephone call.   - echart message- for normal results that have been seen by the patient already.   - telephone call: abnormal results or if patient has not viewed results in their echart.    You can use albuterol every 4 hours while sick if needed Use your neb machine every 8 hours while ill, once you start breathing better cut back to twice a day , then back to once a day after illness resolves.   Start new antibiotic once a day for 7 days. This antibiotic treats pneumonia. It is a 5 day course, but it stays in your system longer than the 7 days to treat.  Called in another round of prednisone as well.   I called in the hycodan cough syrup for you as well.

## 2023-03-10 NOTE — Progress Notes (Signed)
Tracy [name] , August 12, 1951, 72 y.o., female MRN: [id number] Patient Care Team    Relationship Specialty Notifications Start End  [name], DO PCP - General Family Medicine  05/21/20   [name], MD Consulting Physician Hematology All results, Admissions 05/07/20   [name], DO Consulting Physician Pulmonary Disease  05/27/20   [facility]    05/27/20   [name], MD Consulting Physician General Surgery  [id number]   [name], MD Consulting Physician General Surgery  05/27/20   [name], [name], MD Consulting Physician Gastroenterology  01/01/21     Chief Complaint  Patient presents with   Nasal Congestion    Chest congestion; cough; since last visit has not gotten better but has not gotten worse.     Subjective: Tracy [name] is a 72 y.o. Pt presents for an OV with complaints of continued cough-which is now productive, nasal congestion, right sinusitis.  She was seen little over a week ago treated with doxycycline twice daily and prednisone taper.  Patient reports she was able to be active with her grandchildren over last weekend, but then started to feel worse again.  She does feel winded.  She is taking her Trelegy daily as prescribed.  She is using her albuterol neb daily. Patient is a terminal illness patient.     02/24/2023   11:05 AM 08/07/2022    1:22 PM 01/01/2021    9:59 AM 10/08/2020    8:26 AM 07/02/2020   11:09 AM  Depression screen PHQ 2/9  Decreased Interest 0 0 0 0 0  Down, Depressed, Hopeless 0 0 0 0 0  PHQ - 2 Score 0 0 0 0 0  Altered sleeping     0  Tired, decreased energy     0  Change in appetite     0  Feeling bad or failure about yourself      0  Trouble concentrating     0  Moving slowly or fidgety/restless     0  Suicidal thoughts     0  PHQ-9 Score     0  Difficult doing work/chores     Not difficult at all    Allergies  Allergen Reactions   Amlodipine     Leg swelling.    Codeine Swelling    Social History   Social History Narrative   Marital status/children/pets: Married.  2 children.   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   Safety:      -smoke alarm in the home:Yes     - wears seatbelt: Yes     - Feels safe in their relationships: Yes   Past Medical History:  Diagnosis Date   Acute deep vein thrombosis (DVT) of brachial vein of right upper extremity (HCC)    Acute on chronic respiratory failure with hypoxia (HCC)    Asthma    Cataract    CHICKENPOX, HX OF 01/06/2011   Qualifier: Diagnosis of  By: Charlett Blake MD, Stacey     Closed fracture of fifth metatarsal bone 08/10/2022   Colon cancer ([place])    COPD (chronic obstructive pulmonary disease) ([place]) 2011   FeV1 31% predicted FeV1/FVX 47 %   COVID-19 05/2021   Essential hypertension 05/23/2020   Hemorrhoid    HIP PAIN, LEFT 01/06/2011   Qualifier: Diagnosis of   By: Charlett Blake MD, Stacey       Open right radial fracture 2020   Osteopenia    Perforated  sigmoid colon ([place]) 03/03/2020   Pleural effusion    Pneumonia 2021   Postoperative intra-abdominal abscess 2021   Rectal cancer (HCC)    Seasonal allergies    takes Claritin daily prn   Shock circulatory ([place])    Tracheostomy status ([place])    Vitamin D deficiency    takes Vit d every 14 days   Past Surgical History:  Procedure Laterality Date   AUGMENTATION MAMMAPLASTY     saline   EXAMINATION UNDER ANESTHESIA  10/14/2012   Procedure: EXAM UNDER ANESTHESIA;  Surgeon: Gayland Curry, MD,FACS;  Location: [facility];  Service: General;  Laterality: N/A;  rectal exam under anesthesia excisional hemorrhoidectomy hemorrhoidal banding x two   EXAMINATION UNDER ANESTHESIA  02/03/2013   excision hemorrhoidal tissue   FOOT SURGERY     left bunionectomy   HEMORRHOIDECTOMY WITH HEMORRHOID BANDING  10/14/2012   Procedure: HEMORRHOIDECTOMY WITH HEMORRHOID BANDING;  Surgeon: Gayland Curry, MD,FACS;  Location: [facility];  Service: General;  Laterality: N/A;   IR IMAGING  GUIDED PORT INSERTION  02/03/2021   IR THORACENTESIS ASP PLEURAL SPACE W/IMG GUIDE  04/23/2020   LAPAROTOMY N/A 03/03/2020   Procedure: low anterior resection end colostomy;  Surgeon: [name], MD;  Location: [facility];  Service: General;  Laterality: N/A;   LAPAROTOMY N/A 03/12/2020   Procedure: EXPLORATORY LAPAROTOMY WITH BOWEL RESECTION AND COLOSTOMY;  Surgeon: [name], MD;  Location: [facility];  Service: General;  Laterality: N/A;   OPEN REDUCTION INTERNAL FIXATION (ORIF) DISTAL RADIAL FRACTURE Right 11/01/2018   Procedure: OPEN REDUCTION INTERNAL FIXATION (ORIF) DISTAL RADIAL FRACTURE;  Surgeon: [name], MD;  Location: [facility];  Service: Orthopedics;  Laterality: Right;   TONSILLECTOMY  1960   recurrent otitis media   US ECHOCARDIOGRAPHY  02/2020    poor windows, normal LV function, severely dilated RV with moderately reduced function, RV volume and pressure overload, mildly dilated RA   Family History  Problem Relation Age of Onset   Osteoporosis Mother    Other Father        [place] in snow, broke ankle, blood clot to lungs   Heart murmur Sister    Cancer Sister        leukemia    Other Brother        h/o being hit by lightening   Parkinsonism Brother    Asthma Son    Allergies Son    Otitis media Son    Other Sister        CML   Asthma Son    Allergies Son    Pancreatic cancer Brother 78       diag 12-18-13-deceased 02-17-14   Prostate cancer Brother    Prostate cancer Paternal Uncle    Colon cancer Neg Hx    Rectal cancer Neg Hx    Stomach cancer Neg Hx    Allergies as of 03/10/2023       Reactions   Amlodipine    Leg swelling.    Codeine Swelling        Medication List        Accurate as of March 10, 2023  3:02 PM. If you have any questions, ask your nurse or doctor.          acetaminophen 325 MG tablet Commonly known as: Tylenol Take 2 tablets (650 mg total) by mouth every 6 (six) hours as needed for mild pain.    albuterol (2.5 MG/3ML) 0.083% nebulizer solution Commonly known as:  PROVENTIL Take 3 mLs (2.5 mg total) by nebulization every 4 (four) hours as needed for wheezing or shortness of breath. DX: J44.9   albuterol 108 (90 Base) MCG/ACT inhaler Commonly known as: VENTOLIN HFA Inhale 2 puffs into the lungs every 6 (six) hours as needed for wheezing or shortness of breath.   amLODipine 2.5 MG tablet Commonly known as: NORVASC Take 1 tablet (2.5 mg total) by mouth daily.   doxycycline 100 MG tablet Commonly known as: VIBRA-TABS Take 1 tablet (100 mg total) by mouth 2 (two) times daily.   fluticasone 50 MCG/ACT nasal spray Commonly known as: FLONASE SPRAY 2 SPRAYS INTO EACH NOSTRIL EVERY DAY   gabapentin 100 MG capsule Commonly known as: Neurontin Take 1 capsule (100 mg total) by mouth at bedtime. OK to increase by 100mg  every week until 300mg  for left thigh pain.   HYDROcodone bit-homatropine 5-1.5 MG/5ML syrup Commonly known as: HYCODAN Take 5 mLs by mouth every 6 (six) hours as needed for cough.   levofloxacin 750 MG tablet Commonly known as: Levaquin Take 1 tablet (750 mg total) by mouth daily for 7 days. Started by: Howard Pouch, DO   loratadine 10 MG tablet Commonly known as: CLARITIN Take 1 tablet (10 mg total) by mouth daily.   losartan 100 MG tablet Commonly known as: COZAAR Take 1 tablet (100 mg total) by mouth daily.   ondansetron 8 MG tablet Commonly known as: ZOFRAN Take 1 tablet (8 mg total) by mouth every 8 (eight) hours as needed for nausea or vomiting.   predniSONE 20 MG tablet Commonly known as: DELTASONE 60 mg x3d, 40 mg x3d, 20 mg x2d, 10 mg x2d   prochlorperazine 10 MG tablet Commonly known as: COMPAZINE Take 1 tablet (10 mg total) by mouth every 6 (six) hours as needed for nausea or vomiting.   rivaroxaban 20 MG Tabs tablet Commonly known as: Xarelto Take 1 tablet (20 mg total) by mouth daily with supper.   Trelegy Ellipta 100-62.5-25 MCG/ACT  Aepb Generic drug: Fluticasone-Umeclidin-Vilant TAKE 1 PUFF BY MOUTH EVERY DAY        All past medical history, surgical history, allergies, family history, immunizations andmedications were updated in the EMR today and reviewed under the history and medication portions of their EMR.     ROS Negative, with the exception of above mentioned in HPI   Objective:  BP (!) 156/80   Pulse 80   Temp 97.7 F (36.5 C)   Wt 117 lb (53.1 kg)   LMP 12/14/2000   SpO2 92%   BMI 22.85 kg/m  Body mass index is 22.85 kg/m. Physical Exam Vitals and nursing note reviewed.  Constitutional:      General: She is not in acute distress.    Appearance: Normal appearance. She is not ill-appearing, toxic-appearing or diaphoretic.     Comments: Appears winded speaking  HENT:     Head: Normocephalic and atraumatic.  Eyes:     General: No scleral icterus.       Right eye: No discharge.        Left eye: No discharge.     Extraocular Movements: Extraocular movements intact.     Conjunctiva/sclera: Conjunctivae normal.     Pupils: Pupils are equal, round, and reactive to light.  Cardiovascular:     Rate and Rhythm: Normal rate and regular rhythm.  Pulmonary:     Breath sounds: Wheezing, rhonchi and rales present.  Musculoskeletal:     Right lower leg: No edema.  Left lower leg: No edema.  Skin:    General: Skin is warm.     Findings: No rash.  Neurological:     Mental Status: She is alert and oriented to person, place, and time. Mental status is at baseline.     Motor: No weakness.     Gait: Gait normal.  Psychiatric:        Mood and Affect: Mood normal.        Behavior: Behavior normal.        Thought Content: Thought content normal.        Judgment: Judgment normal.     No results found. No results found. No results found for this or any previous visit (from the past 24 hour(s)).  Assessment/Plan: Tracy [name] is a 72 y.o. female present for OV for  COPD, severe  (HCC)/Bronchial pneumonia/Hospice care patient - ipratropium-albuterol (DUONEB) 0.5-2.5 (3) MG/3ML nebulizer solution 3 mL Oxygen sat is down from prior visits at 92% today and she appears winded. Yevonne Pax tx adminstered> durations improved to 95% and patient felt improvement in her breathing. LQ x 7 days Prednisone taper Patient encouraged to use her albuterol nebulizer at least every 8 hours during illness can use every 4 hours if needed.  When she sees improvement slowly taper down to her usual once a day treatment. Hycodan cough syrup prescribed for her. Declined chest x-ray She has follow-up scheduled for April 5th  Reviewed expectations re: course of current medical issues. Discussed self-management of symptoms. Outlined signs and symptoms indicating need for more acute intervention. Patient verbalized understanding and all questions were answered. Patient received an After-Visit Summary.    No orders of the defined types were placed in this encounter.  Meds ordered this encounter  Medications   levofloxacin (LEVAQUIN) 750 MG tablet    Sig: Take 1 tablet (750 mg total) by mouth daily for 7 days.    Dispense:  7 tablet    Refill:  0   HYDROcodone bit-homatropine (HYCODAN) 5-1.5 MG/5ML syrup    Sig: Take 5 mLs by mouth every 6 (six) hours as needed for cough.    Dispense:  240 mL    Refill:  0   ipratropium-albuterol (DUONEB) 0.5-2.5 (3) MG/3ML nebulizer solution 3 mL   predniSONE (DELTASONE) 20 MG tablet    Sig: 60 mg x3d, 40 mg x3d, 20 mg x2d, 10 mg x2d    Dispense:  18 tablet    Refill:  0   Referral Orders  No referral(s) requested today     Note is dictated utilizing voice recognition software. Although note has been proof read prior to signing, occasional typographical errors still can be missed. If any questions arise, please do not hesitate to call for verification.   electronically signed by:  Howard Pouch, DO  [facility]

## 2023-03-11 ENCOUNTER — Telehealth: Payer: Self-pay

## 2023-03-11 ENCOUNTER — Other Ambulatory Visit: Payer: Self-pay

## 2023-03-11 NOTE — Telephone Encounter (Signed)
Manus Gunning for [facility] went out to talk to patient about the [facility] care program, patient and family have decided to forgo any more treatment and go on [facility]. Manus Gunning called to make Dr. Burr Medico and the care team aware of her choice and to cancel any further treatment.

## 2023-03-12 NOTE — Telephone Encounter (Signed)
OK refill fluticasone x 1 refill (90 days supply).  Patient last OV 10/30/2022.  Patient has scheduled OV with Dr. Valeta Harms on 04/08/2023.

## 2023-03-15 MED FILL — Dexamethasone Sodium Phosphate Inj 100 MG/10ML: INTRAMUSCULAR | Qty: 1 | Status: AC

## 2023-03-15 NOTE — Progress Notes (Unsigned)
Patient Care Team: [name], DO as PCP - General (Family Medicine) [name], MD as Consulting Physician (Hematology) Icard, Octavio Graves, DO as Consulting Physician (Pulmonary Disease) [place], P.A. [name], MD as Consulting Physician (General Surgery) [name], MD as Consulting Physician (General Surgery) [name], [name], MD as Consulting Physician (Gastroenterology)    I connected with Tracy [name] on 03/15/23 at 10:00 AM EDT by {Blank single:[id number]::"video enabled telemedicine visit","telephone visit"} and verified that I am speaking with the correct person using two identifiers.   I discussed the limitations, risks, security and privacy concerns of performing an evaluation and management service by telemedicine and the availability of in-person appointments. I also discussed with the patient that there may be a patient responsible charge related to this service. The patient expressed understanding and agreed to proceed.   Other persons participating in the visit and their role in the encounter: ***   Patient's location: ***  Provider's location: ***   CHIEF COMPLAINT: Follow up metastatic colon cancer   Oncology History Overview Note  Cancer Staging Rectal cancer [facility]) Staging form: Colon and Rectum, AJCC 8th Edition - Pathologic stage from 03/03/2020: Stage IIIB (pT4a, pN1b, cM0) - Signed by [name], MD on 01/28/2021 Stage prefix: Initial diagnosis Histologic grading system: 4 grade system Histologic grade (G): G2 Residual tumor (R): R0 - None    Rectosigmoid cancer  03/03/2020 - 04/05/2020 Hospital Admission   She was admitted to ED on 03/03/20 for abdominal pain and nausea. She had been having diarrhea for several weeks. During hospital stay she developed acute respiratory failure, AKI, RUE DVT from PICC line. Work up showed bowel perforation, small left liver mass and thickening of jejunum. She underwent emergent surgery on  03/03/20 for resection and colostomy placement. Her path showed invasive cancer, metastatic to 3/15 LNs. She had a NGT in placed but this was removed on POD 3. Post op her stoma became necrotic and septic. She had another bowel surgery on 03/12/20, which was NED with necrotic tissue.    03/03/2020 Imaging   CT AP 03/03/20  IMPRESSION: 1. Free intraperitoneal air consistent perforation. Most likely site of perforation is in the distal splenic flexure/proximal descending colon where there is a collection of air and gas measuring 6 centimeters. No obvious soft tissue mass identified in this region. At this site, there is abrupt transition of dilated, stool-filled colon to completely decompressed proximal descending colon. 2. Thickened, inflamed loops of jejunum are identified within the pelvis and are likely reactive. 3. Small hiatal hernia. 4. Benign-appearing 1.6 centimeter mass the LEFT hepatic lobe. Recommend comparison with prior studies if available. 5.  Emphysema (ICD10-J43.9). 6.  Aortic Atherosclerosis (ICD10-I70.0). 7. Bilateral renal scarring.   03/03/2020 Surgery   low anterior resection end colostomy by Dr Marcello Moores    03/03/2020 Initial Biopsy   FINAL MICROSCOPIC DIAGNOSIS: 03/03/20 A. RECTOSIGMOID COLON, LOW ANTERIOR RESECTION:  - Invasive colonic adenocarcinoma, 3.5 cm.  - Tumor invades the visceral peritoneum.  - Margins of resection are not involved.  - Metastatic carcinoma in (3) of (15) lymph nodes.  - See oncology table.   B. ADDITIONAL SIGMOID COLON, RESECTION:  - Colonic tissue, negative for carcinoma.  ADDENDUM:  Mismatch Repair Protein (IHC)   SUMMARY INTERPRETATION: NORMAL  There is preserved expression of the major MMR proteins. There is a very  low probability that microsatellite instability (MSI) is present.  However, certain clinically significant MMR protein mutations may result  in preservation  of nuclear expression. It is recommended that the   preservation of protein expression be correlated with molecular based  MSI testing.   IHC EXPRESSION RESULTS  TEST           RESULT  MLH1:          Preserved nuclear expression  MSH2:          Preserved nuclear expression  MSH6:          Preserved nuclear expression  PMS2:          Preserved nuclear expression   03/03/2020 Cancer Staging   Staging form: Colon and Rectum, AJCC 8th Edition - Pathologic stage from 03/03/2020: Stage IIIB (pT4a, pN1b, cM0) - Signed by [name], MD on 01/28/2021 Stage prefix: Initial diagnosis Histologic grading system: 4 grade system Histologic grade (G): G2 Residual tumor (R): R0 - None   03/08/2020 Imaging   CT AP 03/08/20 IMPRESSION: 1. Interval midline laparotomy with distal colon resection and diverting left lower quadrant colostomy. 2. Diffuse small bowel dilatation with gas fluid levels most consistent with postoperative ileus. 3. Trace ascites within the abdomen and pelvis. No fluid collection or abscess at this time. Surgical drain within the lower pelvis. 4. Indeterminate 1.4 cm subcapsular liver hypodensity. In light of newly diagnosed rectal cancer, metastatic disease cannot be excluded. PET CT may be useful for further evaluation. 5. Interval development of trace bilateral pleural effusions and diffuse body wall edema.   03/12/2020 Surgery   EXPLORATORY LAPAROTOMY WITH BOWEL RESECTION AND COLOSTOMY by Dr Hassell Done 03/12/20  FINAL MICROSCOPIC DIAGNOSIS: 03/12/20  A. COLON, SPLENIC FLEXURE, RESECTION:  - Segment of colon (37 cm) with perforation and associated inflammation  - Multiple mucosal ulcers with necrotizing inflammation  - No evidence of malignancy    03/12/2020 Imaging   CT AP WO contrast 03/12/20  IMPRESSION: 1. Exam is limited by lack of intravenous contrast, motion, and artifact from the patient's arms adjacent to the torso. Since 03/08/2020, there has been development of relatively large pockets of intraperitoneal free air.  While intraperitoneal gas would not be unexpected on postoperative day 9, this gas is new since an intervening study of 03/08/2020 and given the relatively large volume certainly raises concern for bowel perforation. No source for the intraperitoneal free air is evident on this study. There is a surgical drain in the pelvis, but there is no free gas around the drain itself to suggest that it represents the source. 2. New circumferential wall thickening in the splenic flexure, descending colon and sigmoid colon leading into the end colostomy. Infection/inflammation would be a consideration. Ischemia cannot be excluded. 3. Relatively small volume intraperitoneal free fluid. High attenuation small fluid collections in the left upper abdomen may reflect hemorrhage, infection or residua from prior perforation. 4. Interval progression of diffuse body wall edema. 5. Residual contrast material in the renal parenchyma from prior imaging, compatible with renal dysfunction.    03/19/2020 Imaging   CT CAP 03/19/20  IMPRESSION: 1. 5.5 x 3.2 cm fluid collection is noted along the greater curvature of the proximal stomach. 2. Surgical drain is again noted in the pelvis with tip in left lower quadrant. 3. Interval development of crescent-shaped fluid collection measuring 16.4 x 3.3 cm in the epigastric region and left upper quadrant of the abdomen which may extend into the left lower quadrant. Potentially this may represent abscess or developing abscess. Multiple other smaller fluid collections are noted which may represent small abscesses. 4. Colostomy is noted in the  left lower quadrant. 5. Mild amount of free fluid is noted in the posterior pelvis. 6. Moderate anasarca is noted. Aortic Atherosclerosis (ICD10-I70.0).   04/12/2020 Imaging   CT AP 04/12/20  IMPRESSION: 1. Fluid collections within the LEFT abdomen have significantly decreased compared to previous CT exams, now nearly  completely resolved. 2. Percutaneous drainage catheter with tip coiled posterior to the LEFT kidney, stable positioning compared to the previous study. The more anterior catheter has been removed. 3. Small amount of free fluid persists within the abdomen and pelvis. 4. Trace bibasilar pleural effusions. 5. Anasarca. 6. While reviewing today's study, comparing with a chest/abdomen/pelvis CT from earlier same month, there is question of thrombus in the LEFT internal jugular vein. Recommend ultrasound of the LEFT IJ to exclude DVT. This recommendation discussed with patient's hospitalist, Dr. Owens Shark, on 04/12/2020 at 4:20 p.m.   Emphysema (ICD10-J43.9).   04/22/2020 Imaging   CT AP 04/22/20  IMPRESSION: 1. No recurrent intra-abdominal abscess status post interval removal of left retroperitoneal percutaneous drain. Stable small amount of free pelvic fluid. 2. Enlarging dependent bilateral pleural effusions, now moderate in volume. Associated atelectasis at both lung bases. 3. Progressive anasarca with generalized edema throughout the subcutaneous fat. 4. Stable small subcapsular fluid collection along the anterior aspect of the left hepatic lobe. 5. Aortic Atherosclerosis (ICD10-I70.0).   05/23/2020 Initial Diagnosis   Rectal cancer ([place])   01/14/2021 Imaging   CT CAP IMPRESSION: -5.0 cm soft tissue mass along the posterior aspect of the cecum at the base of the appendix, favored to reflect a peritoneal/serosal implant, corresponding to the patient's known adenocarcinoma. This is new from the prior. -Suspected additional pelvic implants along the uterus and right adnexa, poorly visualized, new/progressive from the prior. -Additional pericapsular lesion along the lateral spleen is mildly progressive, suspicious for additional peritoneal implant. -No evidence of metastatic disease in the chest.   01/27/2021 PET scan   IMPRESSION:  1. Extensive hypermetabolic peritoneal metastasis,  as detailed  above.  2. No evidence of hypermetabolic supradiaphragmatic disease.  3. Diffuse low-level thyroid hypermetabolism can be seen in the  setting of thyroiditis. Consider correlation with thyroid function  labs.  4. Aortic atherosclerosis (ICD10-I70.0) and emphysema (ICD10-J43.9).    02/01/2021 -  Chemotherapy    First-line chemo Xeloda 1500 mg twice daily for day 1-14, every 21 days, started on 02/01/2021. Reduced to 1 week on/ 1 week off and added bevacizumab q2weeks from C2 on 02/24/21.  ----Given good tolerance, I changed to CAPOX and bevacizumab q2weeks from C4 on 03/24/21 with Xeloda 1500mg  in the AM and 1000mg  in the PM 1 week on/1 week off.     03/24/2021 - 01/04/2023 Chemotherapy   Patient is on Treatment Plan : COLORECTAL CapeOx + Bevacizumab q21d     05/08/2021 Imaging   PET  IMPRESSION: 1. There is been interval development of several FDG avid pulmonary nodules within both lungs. Additionally, there is new FDG avid low left paratracheal lymph node. Thoracic metastasis cannot be excluded. 2. Interval improvement in multifocal FDG avid peritoneal metastasis as detailed above.     08/14/2021 Imaging   CT CAP  IMPRESSION: Resolution of previously seen small bilateral pulmonary nodules since previous study.   Stable 8 mm mediastinal lymph node in the AP window.   No evidence of metastatic disease within the abdomen or pelvis.   Nonocclusive pulmonary embolism in bilateral central lower lobe pulmonary arteries, which appears subacute in age.   12/25/2021 Imaging   EXAM: CT CHEST, ABDOMEN,  AND PELVIS WITH CONTRAST  IMPRESSION: 1. Minimal, bandlike residua of previously seen FDG avid pulmonary nodules, consistent with treated metastases. No new pulmonary nodules. 2. Unchanged post treatment appearance of the cecal base, with soft tissue thickening about the cecal base and adjacent pelvis, previously FDG avid and in keeping with known appendiceal  orifice malignancy. 3. Unchanged central fluid attenuation lesions about the spleen, left iliopsoas, and left pelvic sidewall, previously FDG avid, consistent with treated peritoneal implants. 4. No evidence of new metastatic disease in the chest, abdomen, or pelvis 5. Status post rectosigmoid colon resection with left lower quadrant end colostomy.   Aortic Atherosclerosis (ICD10-I70.0).   02/01/2023 -  Chemotherapy   Patient is on Treatment Plan : COLORECTAL FOLFIRI + Bevacizumab q14d     Malignant neoplasm of appendix  01/06/2021 Procedure   (surveillance colonoscopy for h/o rectal cancer) Impression:  - Preparation of the colon was fair, lavage performed with mostly adequate views. - Polypoid lesion obliterating appendiceal orifice. Biopsied to evaluate for malignancy. - The examined portion of the ileum was normal. - Two large polyps in the cecum, not removed today pending path at appendiceal orifice, bowel prep. If removed endoscopically in the future would favor EMR to be done at the hospital. - One 5 mm polyp at the ileocecal valve, removed with a cold snare. Resected and retrieved. - One 3 mm polyp in the transverse colon, removed with a cold snare. Resected and retrieved. - Parastomal hernia making initial entry to the distal colon challenging. - The examination was otherwise normal.   01/06/2021 Initial Biopsy   Diagnosis 1. Colon, biopsy, appendiceal oraface - ADENOCARCINOMA. 2. Colon, polyp(s), ileocecal valve, transverse, x2 - TUBULAR ADENOMA, NEGATIVE FOR HIGH GRADE DYSPLASIA (X1). - COLONIC MUCOSA WITH UNDERLYING LYMPHOID AGGREGATE, NEGATIVE FOR DYSPLASIA (X1).  MMR normal   01/06/2021 Initial Diagnosis   Malignant neoplasm of appendix ([place])   01/06/2021 Cancer Staging   Staging form: Appendix - Carcinoma, AJCC 8th Edition - Clinical stage from 01/06/2021: Stage IVC (cTX, cNX, cM1c) - Signed by [name], MD on 12/29/2021   01/14/2021 Imaging   CT CAP  IMPRESSION: -5.0 cm soft tissue mass along the posterior aspect of the cecum at the base of the appendix, favored to reflect a peritoneal/serosal implant, corresponding to the patient's known adenocarcinoma. This is new from the prior. -Suspected additional pelvic implants along the uterus and right adnexa, poorly visualized, new/progressive from the prior. -Additional pericapsular lesion along the lateral spleen is mildly progressive, suspicious for additional peritoneal implant. -No evidence of metastatic disease in the chest.   08/14/2021 Imaging   CT CAP IMPRESSION: Resolution of previously seen small bilateral pulmonary nodules since previous study. Stable 8 mm mediastinal lymph node in the AP window. No evidence of metastatic disease within the abdomen or pelvis. Nonocclusive pulmonary embolism in bilateral central lower lobe pulmonary arteries, which appears subacute in age.  Aortic Atherosclerosis (ICD10-I70.0) and Emphysema (ICD10-J43.9).     08/14/2021 Imaging   CT CAP  IMPRESSION: Resolution of previously seen small bilateral pulmonary nodules since previous study.   Stable 8 mm mediastinal lymph node in the AP window.   No evidence of metastatic disease within the abdomen or pelvis.   Nonocclusive pulmonary embolism in bilateral central lower lobe pulmonary arteries, which appears subacute in age.   12/25/2021 Imaging   EXAM: CT CHEST, ABDOMEN, AND PELVIS WITH CONTRAST  IMPRESSION: 1. Minimal, bandlike residua of previously seen FDG avid pulmonary nodules, consistent with treated metastases. No new  pulmonary nodules. 2. Unchanged post treatment appearance of the cecal base, with soft tissue thickening about the cecal base and adjacent pelvis, previously FDG avid and in keeping with known appendiceal orifice malignancy. 3. Unchanged central fluid attenuation lesions about the spleen, left iliopsoas, and left pelvic sidewall, previously FDG avid, consistent with  treated peritoneal implants. 4. No evidence of new metastatic disease in the chest, abdomen, or pelvis 5. Status post rectosigmoid colon resection with left lower quadrant end colostomy.   Aortic Atherosclerosis (ICD10-I70.0).      CURRENT THERAPY: FOLFIRI/Beva q14 days, starting 02/01/23  INTERVAL HISTORY Ms. Forestier presents for phone f/up as scheduled. Last seen by Dr. Burr Medico 03/01/23  ROS   Past Medical History:  Diagnosis Date   Acute deep vein thrombosis (DVT) of brachial vein of right upper extremity ([place])    Acute on chronic respiratory failure with hypoxia ([place])    Asthma    Cataract    CHICKENPOX, HX OF 01/06/2011   Qualifier: Diagnosis of  By: Charlett Blake MD, Stacey     Closed fracture of fifth metatarsal bone 08/10/2022   Colon cancer ([place])    COPD (chronic obstructive pulmonary disease) ([place]) 2011   FeV1 31% predicted FeV1/FVX 47 %   COVID-19 05/2021   Essential hypertension 05/23/2020   Hemorrhoid    HIP PAIN, LEFT 01/06/2011   Qualifier: Diagnosis of   By: Charlett Blake MD, Stacey       Open right radial fracture 2020   Osteopenia    Perforated sigmoid colon ([place]) 03/03/2020   Pleural effusion    Pneumonia 2021   Postoperative intra-abdominal abscess 2021   Rectal cancer ([facility])    Seasonal allergies    takes Claritin daily prn   Shock circulatory ([place])    Tracheostomy status ([place])    Vitamin D deficiency    takes Vit d every 14 days     Past Surgical History:  Procedure Laterality Date   AUGMENTATION MAMMAPLASTY     saline   EXAMINATION UNDER ANESTHESIA  10/14/2012   Procedure: EXAM UNDER ANESTHESIA;  Surgeon: Gayland Curry, MD,FACS;  Location: [facility];  Service: General;  Laterality: N/A;  rectal exam under anesthesia excisional hemorrhoidectomy hemorrhoidal banding x two   EXAMINATION UNDER ANESTHESIA  02/03/2013   excision hemorrhoidal tissue   FOOT SURGERY     left bunionectomy   HEMORRHOIDECTOMY WITH HEMORRHOID BANDING  10/14/2012   Procedure: HEMORRHOIDECTOMY  WITH HEMORRHOID BANDING;  Surgeon: Gayland Curry, MD,FACS;  Location: [facility];  Service: General;  Laterality: N/A;   IR IMAGING GUIDED PORT INSERTION  02/03/2021   IR THORACENTESIS ASP PLEURAL SPACE W/IMG GUIDE  04/23/2020   LAPAROTOMY N/A 03/03/2020   Procedure: low anterior resection end colostomy;  Surgeon: [name], MD;  Location: [facility];  Service: General;  Laterality: N/A;   LAPAROTOMY N/A 03/12/2020   Procedure: EXPLORATORY LAPAROTOMY WITH BOWEL RESECTION AND COLOSTOMY;  Surgeon: [name], MD;  Location: [facility];  Service: General;  Laterality: N/A;   OPEN REDUCTION INTERNAL FIXATION (ORIF) DISTAL RADIAL FRACTURE Right 11/01/2018   Procedure: OPEN REDUCTION INTERNAL FIXATION (ORIF) DISTAL RADIAL FRACTURE;  Surgeon: [name], MD;  Location: [facility];  Service: Orthopedics;  Laterality: Right;   TONSILLECTOMY  1960   recurrent otitis media   US ECHOCARDIOGRAPHY  02/2020    poor windows, normal LV function, severely dilated RV with moderately reduced function, RV volume and pressure overload, mildly dilated RA     Outpatient Encounter Medications as  of 03/16/2023  Medication Sig   acetaminophen (TYLENOL) 325 MG tablet Take 2 tablets (650 mg total) by mouth every 6 (six) hours as needed for mild pain.   albuterol (PROVENTIL) (2.5 MG/3ML) 0.083% nebulizer solution Take 3 mLs (2.5 mg total) by nebulization every 4 (four) hours as needed for wheezing or shortness of breath. DX: J44.9   albuterol (VENTOLIN HFA) 108 (90 Base) MCG/ACT inhaler Inhale 2 puffs into the lungs every 6 (six) hours as needed for wheezing or shortness of breath. (Patient not taking: Reported on 03/10/2023)   amLODipine (NORVASC) 2.5 MG tablet Take 1 tablet (2.5 mg total) by mouth daily.   doxycycline (VIBRA-TABS) 100 MG tablet Take 1 tablet (100 mg total) by mouth 2 (two) times daily.   fluticasone (FLONASE) 50 MCG/ACT nasal spray SPRAY 2 SPRAYS INTO EACH NOSTRIL EVERY DAY   gabapentin  (NEURONTIN) 100 MG capsule Take 1 capsule (100 mg total) by mouth at bedtime. OK to increase by 100mg  every week until 300mg  for left thigh pain. (Patient not taking: Reported on 03/10/2023)   HYDROcodone bit-homatropine (HYCODAN) 5-1.5 MG/5ML syrup Take 5 mLs by mouth every 6 (six) hours as needed for cough.   levofloxacin (LEVAQUIN) 750 MG tablet Take 1 tablet (750 mg total) by mouth daily for 7 days.   loratadine (CLARITIN) 10 MG tablet Take 1 tablet (10 mg total) by mouth daily.   losartan (COZAAR) 100 MG tablet Take 1 tablet (100 mg total) by mouth daily.   ondansetron (ZOFRAN) 8 MG tablet Take 1 tablet (8 mg total) by mouth every 8 (eight) hours as needed for nausea or vomiting.   predniSONE (DELTASONE) 20 MG tablet 60 mg x3d, 40 mg x3d, 20 mg x2d, 10 mg x2d   prochlorperazine (COMPAZINE) 10 MG tablet Take 1 tablet (10 mg total) by mouth every 6 (six) hours as needed for nausea or vomiting.   rivaroxaban (XARELTO) 20 MG TABS tablet Take 1 tablet (20 mg total) by mouth daily with supper.   TRELEGY ELLIPTA 100-62.5-25 MCG/ACT AEPB TAKE 1 PUFF BY MOUTH EVERY DAY   No facility-administered encounter medications on file as of 03/16/2023.     There were no vitals filed for this visit. There is no height or weight on file to calculate BMI.   PHYSICAL EXAM GENERAL:alert, no distress and comfortable SKIN: no rash  EYES: sclera clear NECK: without mass LYMPH:  no palpable cervical or supraclavicular lymphadenopathy  LUNGS: clear with normal breathing effort HEART: regular rate & rhythm, no lower extremity edema ABDOMEN: abdomen soft, non-tender and normal bowel sounds NEURO: alert & oriented x 3 with fluent speech, no focal motor/sensory deficits Breast exam:  PAC without erythema    CBC    Component Value Date/Time   WBC 7.6 03/01/2023 0941   WBC 7.3 07/13/2022 1007   RBC 3.72 (L) 03/01/2023 0941   HGB 12.0 03/01/2023 0941   HCT 35.8 (L) 03/01/2023 0941   PLT 319 03/01/2023 0941    MCV 96.2 03/01/2023 0941   MCH 32.3 03/01/2023 0941   MCHC 33.5 03/01/2023 0941   RDW 14.3 03/01/2023 0941   LYMPHSABS 1.1 03/01/2023 0941   MONOABS 1.0 03/01/2023 0941   EOSABS 0.0 03/01/2023 0941   BASOSABS 0.0 03/01/2023 0941     CMP     Component Value Date/Time   NA 143 03/01/2023 0941   K 3.0 (L) 03/01/2023 0941   CL 105 03/01/2023 0941   CO2 28 03/01/2023 0941   GLUCOSE 81 03/01/2023 0941  BUN 22 03/01/2023 0941   CREATININE 1.02 (H) 03/01/2023 0941   CALCIUM 9.3 03/01/2023 0941   PROT 7.0 03/01/2023 0941   ALBUMIN 4.1 03/01/2023 0941   AST 20 03/01/2023 0941   ALT 18 03/01/2023 0941   ALKPHOS 94 03/01/2023 0941   BILITOT 0.3 03/01/2023 0941   GFRNONAA 60 (L) 03/01/2023 0941   GFRAA >60 08/27/2020 0857     ASSESSMENT & PLAN:  PLAN:  No orders of the defined types were placed in this encounter.     All questions were answered. The patient knows to call the clinic with any problems, questions or concerns. No barriers to learning were detected. I spent *** counseling the patient face to face. The total time spent in the appointment was *** and more than 50% was on counseling, review of test results, and coordination of care.   Cira Rue, NP-C @DATE @

## 2023-03-16 ENCOUNTER — Encounter: Payer: Self-pay | Admitting: Nurse Practitioner

## 2023-03-16 ENCOUNTER — Telehealth: Payer: Self-pay | Admitting: Family Medicine

## 2023-03-16 ENCOUNTER — Inpatient Hospital Stay: Payer: Medicare HMO

## 2023-03-16 ENCOUNTER — Inpatient Hospital Stay: Payer: Medicare HMO | Attending: Hematology | Admitting: Nurse Practitioner

## 2023-03-16 DIAGNOSIS — C19 Malignant neoplasm of rectosigmoid junction: Secondary | ICD-10-CM | POA: Diagnosis not present

## 2023-03-16 NOTE — Telephone Encounter (Signed)
Quenton Fetter a Equities trader with [facility] called to inform us that Tracy [name] has decided to not do palliative care or enroll in [facility]. She asked Joann to enroll her in hospice care instead since she has decided not to continue with chemo at the cancer center.  She enrolled in hospice care March 29th. Di Kindle also wanted to relay the message that Ms. Fredin will not be attending any upcoming appointments with Dr. Raoul Pitch. I will cancel the remaining appointments.

## 2023-03-16 NOTE — Telephone Encounter (Signed)
Tracy [name] can be reached at [phone], but states it is not necessary she receive a call.

## 2023-03-18 ENCOUNTER — Inpatient Hospital Stay: Payer: Medicare HMO

## 2023-03-19 ENCOUNTER — Ambulatory Visit: Payer: Medicare HMO | Admitting: Family Medicine

## 2023-03-29 ENCOUNTER — Ambulatory Visit: Payer: Medicare HMO | Admitting: Hematology

## 2023-03-29 ENCOUNTER — Inpatient Hospital Stay: Payer: Medicare HMO | Admitting: Hematology

## 2023-03-29 ENCOUNTER — Inpatient Hospital Stay: Payer: Medicare HMO

## 2023-03-29 ENCOUNTER — Other Ambulatory Visit: Payer: Medicare HMO

## 2023-03-29 ENCOUNTER — Ambulatory Visit: Payer: Medicare HMO

## 2023-03-31 ENCOUNTER — Inpatient Hospital Stay: Payer: Medicare HMO

## 2023-04-08 ENCOUNTER — Ambulatory Visit: Payer: Medicare HMO | Admitting: Pulmonary Disease

## 2023-04-12 ENCOUNTER — Other Ambulatory Visit: Payer: Medicare HMO

## 2023-04-12 ENCOUNTER — Ambulatory Visit: Payer: Medicare HMO | Admitting: Hematology

## 2023-04-12 ENCOUNTER — Ambulatory Visit: Payer: Medicare HMO

## 2023-04-30 ENCOUNTER — Other Ambulatory Visit: Payer: Self-pay | Admitting: Pulmonary Disease

## 2023-05-07 ENCOUNTER — Ambulatory Visit: Payer: Medicare HMO | Admitting: Family Medicine

## 2023-05-25 ENCOUNTER — Encounter: Payer: Self-pay | Admitting: Hematology

## 2023-06-08 ENCOUNTER — Other Ambulatory Visit: Payer: Self-pay | Admitting: Pulmonary Disease

## 2023-06-16 IMAGING — CT CT CHEST-ABD-PELV W/ CM
2 of 5 series · 12 of 36 positions shown, 14 images · IV contrast (APPLIED)
Comparison: CT chest, 09/15/2021, CT chest abdomen pelvis,
08/14/2021

CLINICAL DATA: Colorectal cancer restaging, additional history of
appendiceal carcinoma

EXAM:
CT CHEST, ABDOMEN, AND PELVIS WITH CONTRAST
TECHNIQUE: Multidetector CT imaging of the chest, abdomen and pelvis was
performed following the standard protocol during bolus
administration of intravenous contrast.

[Series 2: cap with · axial · 0.98mm/px · z∈[+1125,+1610]mm · 9 of 123 slices shown, 11 images]
[im 13/123  mediastinal]
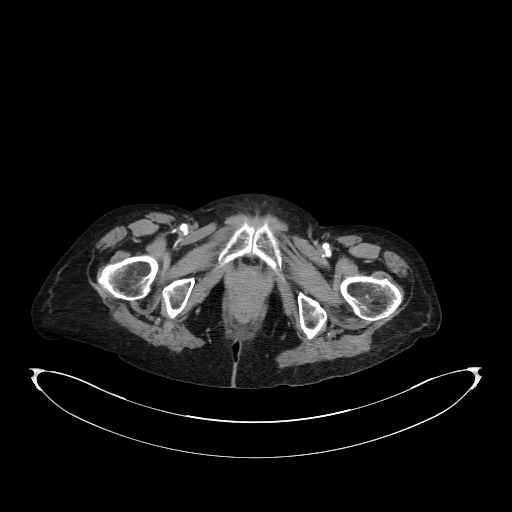
[im 13/123  bone]
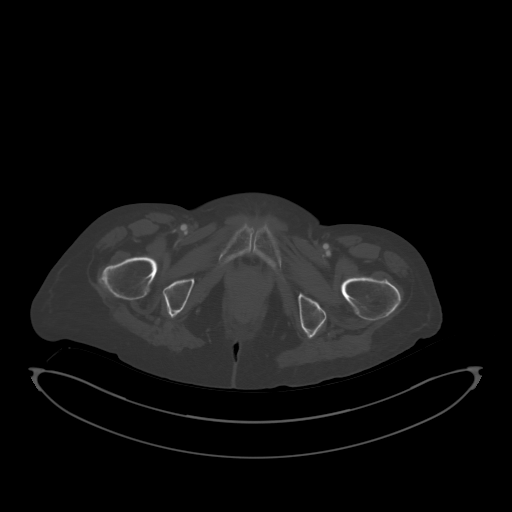
[im 25/123  mediastinal]
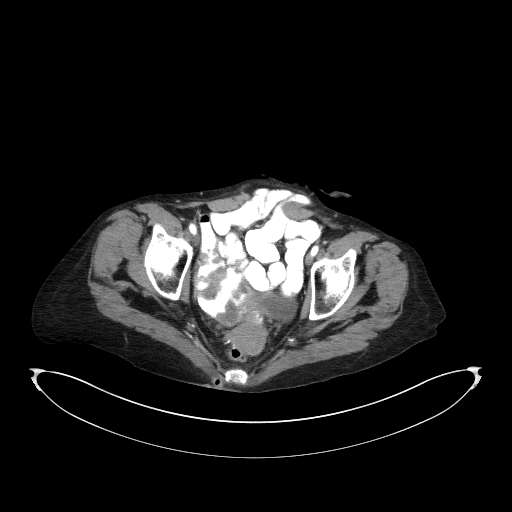
[im 37/123  mediastinal]
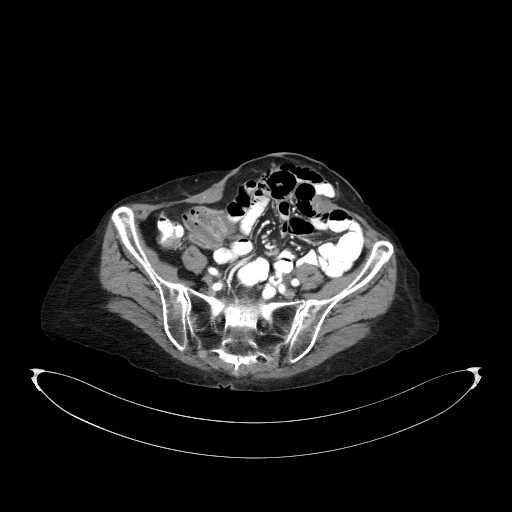
[im 49/123  mediastinal]
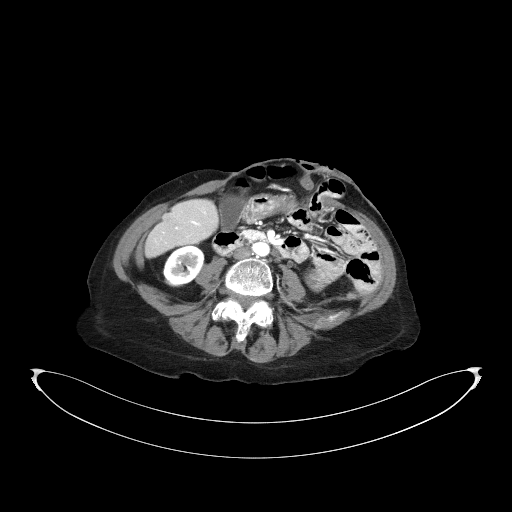
[im 62/123  mediastinal]
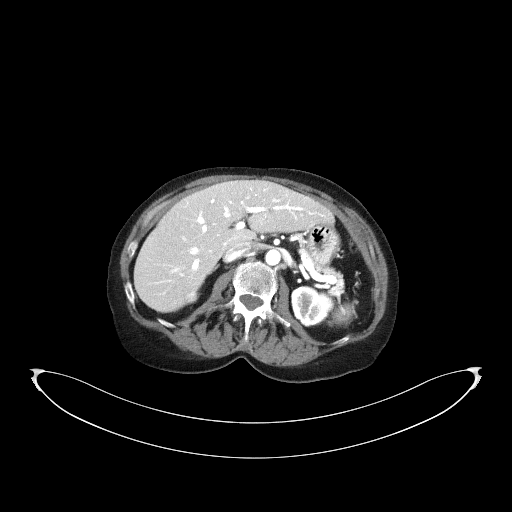
[im 74/123  mediastinal]
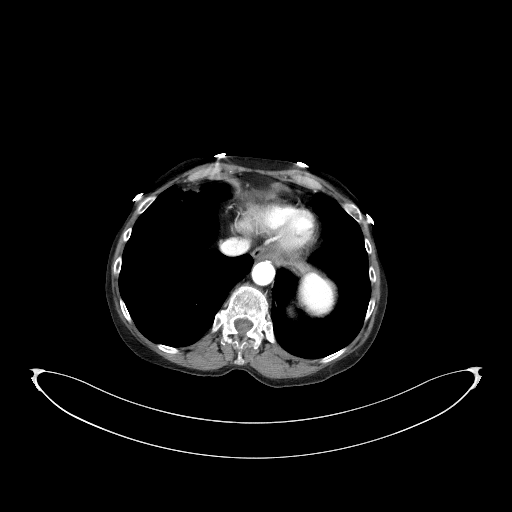
[im 86/123  mediastinal]
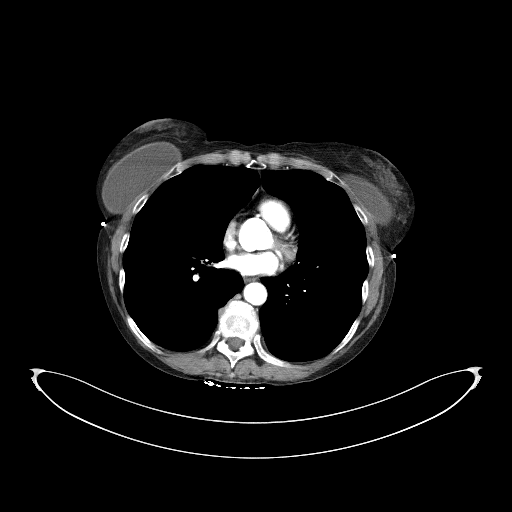
[im 98/123  mediastinal]
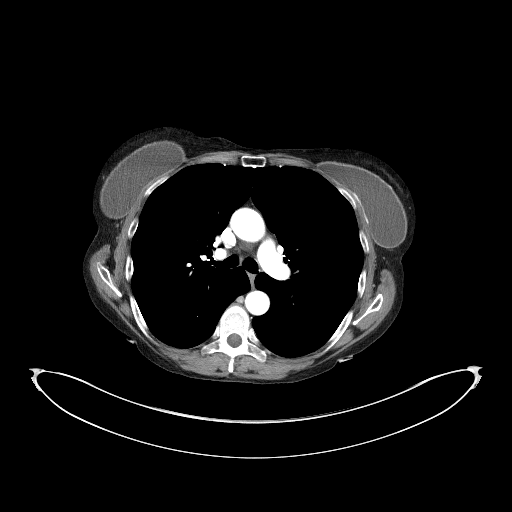
[im 110/123  mediastinal]
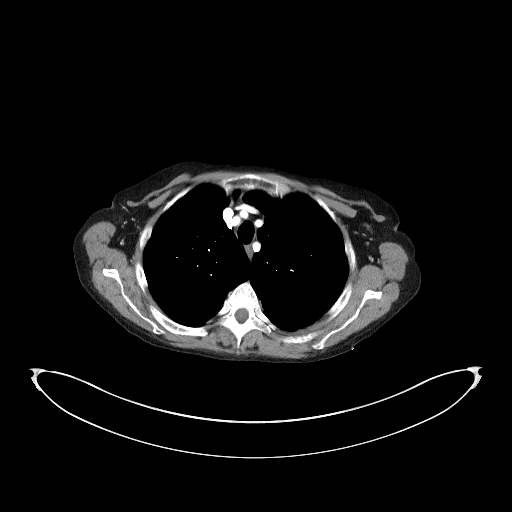
[im 110/123  bone]
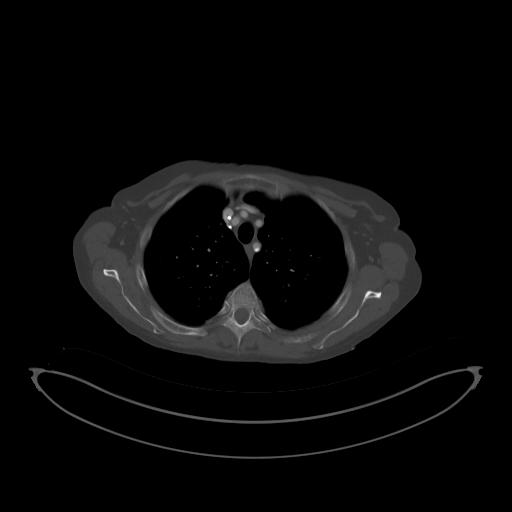

[Series 5: coronals · coronal · 0.83mm/px · 3 of 156 slices shown]
[im 32/156  mediastinal]
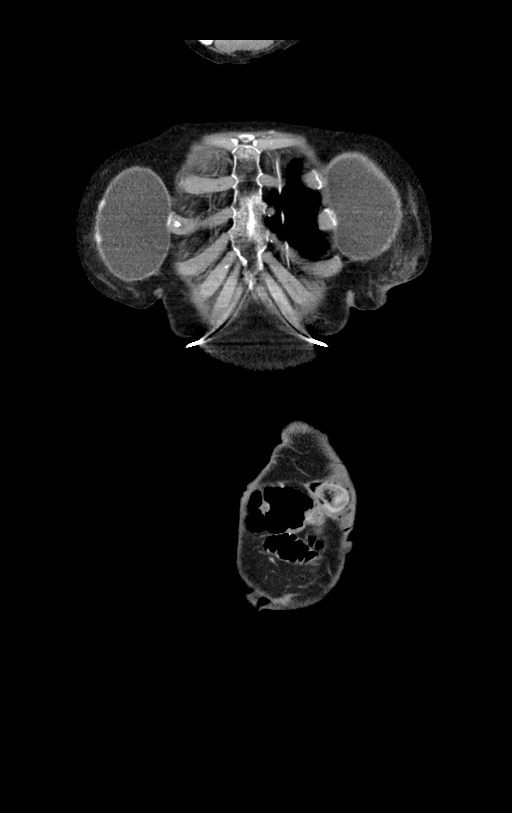
[im 63/156  mediastinal]
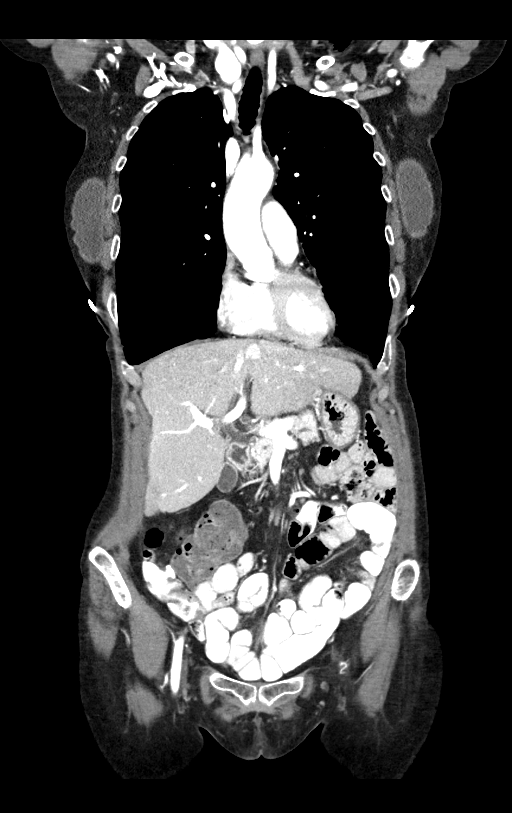
[im 94/156  mediastinal]
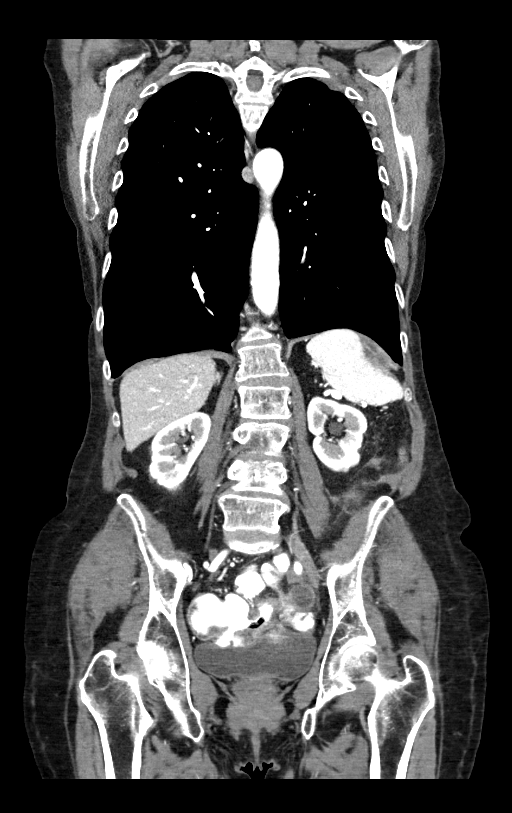

[12 of 36 positions shown; findings below may reference images not displayed]

RADIATION DOSE REDUCTION: This exam was performed according to the
departmental dose-optimization program which includes automated
exposure control, adjustment of the mA and/or kV according to
patient size and/or use of iterative reconstruction technique.

CONTRAST:  100mL OMNIPAQUE IOHEXOL 300 MG/ML SOLN, additional oral
enteric contrast
FINDINGS: CT CHEST FINDINGS

Cardiovascular: Right chest port catheter. Scattered aortic
atherosclerosis. Normal heart size. No pericardial effusion.

Mediastinum/Nodes: No enlarged mediastinal, hilar, or axillary lymph
nodes. Thyroid gland, trachea, and esophagus demonstrate no
significant findings.

Lungs/Pleura: Pulmonary hyperinflation. Mild, bandlike scarring of
the bilateral lung bases. Minimal, bandlike residua of previously
seen FDG avid pulmonary nodules, for example of the paramedian left
upper lobe (series 4, image 78) and of the medial left lung base
(series 4, image 123). No pleural effusion or pneumothorax.

Musculoskeletal: No chest wall mass or suspicious osseous lesions
identified. Bilateral breast implants.

CT ABDOMEN PELVIS FINDINGS

Hepatobiliary: No solid liver abnormality is seen. Subcapsular cyst
or hemangioma of the anterior midline liver (series 2, image 60). No
gallstones, gallbladder wall thickening, or biliary dilatation.

Pancreas: Unremarkable. No pancreatic ductal dilatation or
surrounding inflammatory changes.

Spleen: Normal in size. Unchanged subcapsular fluid attenuation
lesion about the lateral superior spleen measuring 3.5 x 1.6 cm
(series 2, image 54).

Adrenals/Urinary Tract: Adrenal glands are unremarkable. Kidneys are
normal, without renal calculi, solid lesion, or hydronephrosis.
Bladder is unremarkable.

Stomach/Bowel: Stomach is within normal limits. Unchanged post
treatment appearance of the cecal base, with soft tissue thickening
about the cecal base and adjacent pelvis (series 2, image 94), as
well as a central fluid attenuation lesion about the left pelvic
sidewall measuring 4.0 x 1.7 cm (series 2, image 94). Status post
rectosigmoid colon resection with left lower quadrant end colostomy.

Vascular/Lymphatic: Scattered aortic atherosclerosis. No enlarged
abdominal or pelvic lymph nodes.

Reproductive: No mass or other abnormality.

Other: Diastasis rectus with a broad-based midline ventral hernia
and an additional left hemi abdominal parastomal hernia containing
nonobstructed mid small bowel. No ascites. Unchanged, central fluid
attenuation soft tissue implant overlying the left iliopsoas muscle
measuring 2.3 x 0.7 cm (series 2, image 77).

Musculoskeletal: No acute osseous findings.
IMPRESSION: 1. Minimal, bandlike residua of previously seen FDG avid pulmonary
nodules, consistent with treated metastases. No new pulmonary
nodules.
2. Unchanged post treatment appearance of the cecal base, with soft
tissue thickening about the cecal base and adjacent pelvis,
previously FDG avid and in keeping with known appendiceal orifice
malignancy.
3. Unchanged central fluid attenuation lesions about the spleen,
left iliopsoas, and left pelvic sidewall, previously FDG avid,
consistent with treated peritoneal implants.
4. No evidence of new metastatic disease in the chest, abdomen, or
pelvis
5. Status post rectosigmoid colon resection with left lower quadrant
end colostomy.

Aortic Atherosclerosis ([id number]).

## 2023-07-03 ENCOUNTER — Other Ambulatory Visit: Payer: Self-pay | Admitting: Hematology

## 2023-07-05 ENCOUNTER — Encounter: Payer: Self-pay | Admitting: Hematology

## 2023-07-05 ENCOUNTER — Other Ambulatory Visit: Payer: Self-pay

## 2023-07-15 DEATH — deceased

## 2024-09-04 NOTE — Progress Notes (Signed)
 Tracy [name]
